# Patient Record
Sex: Female | Born: 1970 | Race: White | Hispanic: No | Marital: Single | Smoking: Current every day smoker
Health system: Southern US, Community
[De-identification: ages and names within clinical notes are randomized; demographics above are authoritative.]

## PROBLEM LIST (undated history)

## (undated) DIAGNOSIS — I2109 ST elevation (STEMI) myocardial infarction involving other coronary artery of anterior wall: Secondary | ICD-10-CM

## (undated) DIAGNOSIS — E785 Hyperlipidemia, unspecified: Secondary | ICD-10-CM

## (undated) DIAGNOSIS — E1011 Type 1 diabetes mellitus with ketoacidosis with coma: Secondary | ICD-10-CM

## (undated) DIAGNOSIS — H269 Unspecified cataract: Secondary | ICD-10-CM

## (undated) DIAGNOSIS — E119 Type 2 diabetes mellitus without complications: Secondary | ICD-10-CM

## (undated) DIAGNOSIS — I6522 Occlusion and stenosis of left carotid artery: Secondary | ICD-10-CM

## (undated) DIAGNOSIS — F419 Anxiety disorder, unspecified: Secondary | ICD-10-CM

## (undated) DIAGNOSIS — Z8673 Personal history of transient ischemic attack (TIA), and cerebral infarction without residual deficits: Secondary | ICD-10-CM

## (undated) DIAGNOSIS — R51 Headache: Secondary | ICD-10-CM

## (undated) DIAGNOSIS — N059 Unspecified nephritic syndrome with unspecified morphologic changes: Secondary | ICD-10-CM

## (undated) DIAGNOSIS — B029 Zoster without complications: Secondary | ICD-10-CM

## (undated) DIAGNOSIS — G8191 Hemiplegia, unspecified affecting right dominant side: Secondary | ICD-10-CM

## (undated) DIAGNOSIS — Z89512 Acquired absence of left leg below knee: Secondary | ICD-10-CM

## (undated) DIAGNOSIS — L03211 Cellulitis of face: Secondary | ICD-10-CM

## (undated) DIAGNOSIS — I255 Ischemic cardiomyopathy: Secondary | ICD-10-CM

## (undated) DIAGNOSIS — N189 Chronic kidney disease, unspecified: Secondary | ICD-10-CM

## (undated) DIAGNOSIS — Z794 Long term (current) use of insulin: Secondary | ICD-10-CM

## (undated) DIAGNOSIS — F32A Depression, unspecified: Secondary | ICD-10-CM

## (undated) DIAGNOSIS — IMO0001 Reserved for inherently not codable concepts without codable children: Secondary | ICD-10-CM

## (undated) DIAGNOSIS — I739 Peripheral vascular disease, unspecified: Secondary | ICD-10-CM

## (undated) DIAGNOSIS — I639 Cerebral infarction, unspecified: Secondary | ICD-10-CM

## (undated) DIAGNOSIS — I2510 Atherosclerotic heart disease of native coronary artery without angina pectoris: Secondary | ICD-10-CM

## (undated) DIAGNOSIS — F329 Major depressive disorder, single episode, unspecified: Secondary | ICD-10-CM

## (undated) DIAGNOSIS — D689 Coagulation defect, unspecified: Secondary | ICD-10-CM

## (undated) DIAGNOSIS — I749 Embolism and thrombosis of unspecified artery: Secondary | ICD-10-CM

## (undated) HISTORY — DX: Peripheral vascular disease, unspecified: I73.9

## (undated) HISTORY — DX: Personal history of transient ischemic attack (TIA), and cerebral infarction without residual deficits: Z86.73

## (undated) HISTORY — DX: Chronic kidney disease, unspecified: N18.9

## (undated) HISTORY — DX: Cerebral infarction, unspecified: I63.9

## (undated) HISTORY — DX: Cellulitis of face: L03.211

## (undated) HISTORY — DX: Hemiplegia, unspecified affecting right dominant side: G81.91

## (undated) HISTORY — DX: Atherosclerotic heart disease of native coronary artery without angina pectoris: I25.10

## (undated) HISTORY — DX: Major depressive disorder, single episode, unspecified: F32.9

## (undated) HISTORY — DX: Type 2 diabetes mellitus without complications: E11.9

## (undated) HISTORY — DX: Embolism and thrombosis of unspecified artery: I74.9

## (undated) HISTORY — DX: Unspecified cataract: H26.9

## (undated) HISTORY — PX: WRIST SURGERY: SHX841

## (undated) HISTORY — DX: Reserved for inherently not codable concepts without codable children: IMO0001

## (undated) HISTORY — PX: DILATION AND CURETTAGE OF UTERUS: SHX78

## (undated) HISTORY — DX: Long term (current) use of insulin: Z79.4

## (undated) HISTORY — DX: Coagulation defect, unspecified: D68.9

## (undated) HISTORY — DX: Acquired absence of left leg below knee: Z89.512

## (undated) HISTORY — DX: ST elevation (STEMI) myocardial infarction involving other coronary artery of anterior wall: I21.09

## (undated) HISTORY — DX: Unspecified nephritic syndrome with unspecified morphologic changes: N05.9

## (undated) HISTORY — DX: Ischemic cardiomyopathy: I25.5

## (undated) HISTORY — DX: Type 1 diabetes mellitus with ketoacidosis with coma: E10.11

## (undated) HISTORY — PX: MULTIPLE TOOTH EXTRACTIONS: SHX2053

## (undated) SURGERY — Surgical Case
Anesthesia: *Unknown

## (undated) NOTE — *Deleted (*Deleted)
Diabetes Mellitus and Nutrition, Adult When you have diabetes (diabetes mellitus), it is very important to have healthy eating habits because your blood sugar (glucose) levels are greatly affected by what you eat and drink. Eating healthy foods in the appropriate amounts, at about the same times every day, can help you:  Control your blood glucose.  Lower your risk of heart disease.  Improve your blood pressure.  Reach or maintain a healthy weight. Every person with diabetes is different, and each person has different needs for a meal plan. Your health care provider may recommend that you work with a diet and nutrition specialist (dietitian) to make a meal plan that is best for you. Your meal plan may vary depending on factors such as:  The calories you need.  The medicines you take.  Your weight.  Your blood glucose, blood pressure, and cholesterol levels.  Your activity level.  Other health conditions you have, such as heart or kidney disease. How do carbohydrates affect me? Carbohydrates, also called carbs, affect your blood glucose level more than any other type of food. Eating carbs naturally raises the amount of glucose in your blood. Carb counting is a method for keeping track of how many carbs you eat. Counting carbs is important to keep your blood glucose at a healthy level, especially if you use insulin or take certain oral diabetes medicines. It is important to know how many carbs you can safely have in each meal. This is different for every person. Your dietitian can help you calculate how many carbs you should have at each meal and for each snack. Foods that contain carbs include:  Bread, cereal, rice, pasta, and crackers.  Potatoes and corn.  Peas, beans, and lentils.  Milk and yogurt.  Fruit and juice.  Desserts, such as cakes, cookies, ice cream, and candy. How does alcohol affect me? Alcohol can cause a sudden decrease in blood glucose (hypoglycemia),  especially if you use insulin or take certain oral diabetes medicines. Hypoglycemia can be a life-threatening condition. Symptoms of hypoglycemia (sleepiness, dizziness, and confusion) are similar to symptoms of having too much alcohol. If your health care provider says that alcohol is safe for you, follow these guidelines:  Limit alcohol intake to no more than 1 drink per day for nonpregnant women and 2 drinks per day for men. One drink equals 12 oz of beer, 5 oz of wine, or 1 oz of hard liquor.  Do not drink on an empty stomach.  Keep yourself hydrated with water, diet soda, or unsweetened iced tea.  Keep in mind that regular soda, juice, and other mixers may contain a lot of sugar and must be counted as carbs. What are tips for following this plan?  Reading food labels  Start by checking the serving size on the "Nutrition Facts" label of packaged foods and drinks. The amount of calories, carbs, fats, and other nutrients listed on the label is based on one serving of the item. Many items contain more than one serving per package.  Check the total grams (g) of carbs in one serving. You can calculate the number of servings of carbs in one serving by dividing the total carbs by 15. For example, if a food has 30 g of total carbs, it would be equal to 2 servings of carbs.  Check the number of grams (g) of saturated and trans fats in one serving. Choose foods that have low or no amount of these fats.  Check the number of   milligrams (mg) of salt (sodium) in one serving. Most people should limit total sodium intake to less than 2,300 mg per day.  Always check the nutrition information of foods labeled as "low-fat" or "nonfat". These foods may be higher in added sugar or refined carbs and should be avoided.  Talk to your dietitian to identify your daily goals for nutrients listed on the label. Shopping  Avoid buying canned, premade, or processed foods. These foods tend to be high in fat, sodium,  and added sugar.  Shop around the outside edge of the grocery store. This includes fresh fruits and vegetables, bulk grains, fresh meats, and fresh dairy. Cooking  Use low-heat cooking methods, such as baking, instead of high-heat cooking methods like deep frying.  Cook using healthy oils, such as olive, canola, or sunflower oil.  Avoid cooking with butter, cream, or high-fat meats. Meal planning  Eat meals and snacks regularly, preferably at the same times every day. Avoid going long periods of time without eating.  Eat foods high in fiber, such as fresh fruits, vegetables, beans, and whole grains. Talk to your dietitian about how many servings of carbs you can eat at each meal.  Eat 4-6 ounces (oz) of lean protein each day, such as lean meat, chicken, fish, eggs, or tofu. One oz of lean protein is equal to: ? 1 oz of meat, chicken, or fish. ? 1 egg. ?  cup of tofu.  Eat some foods each day that contain healthy fats, such as avocado, nuts, seeds, and fish. Lifestyle  Check your blood glucose regularly.  Exercise regularly as told by your health care provider. This may include: ? 150 minutes of moderate-intensity or vigorous-intensity exercise each week. This could be brisk walking, biking, or water aerobics. ? Stretching and doing strength exercises, such as yoga or weightlifting, at least 2 times a week.  Take medicines as told by your health care provider.  Do not use any products that contain nicotine or tobacco, such as cigarettes and e-cigarettes. If you need help quitting, ask your health care provider.  Work with a counselor or diabetes educator to identify strategies to manage stress and any emotional and social challenges. Questions to ask a health care provider  Do I need to meet with a diabetes educator?  Do I need to meet with a dietitian?  What number can I call if I have questions?  When are the best times to check my blood glucose? Where to find more  information:  American Diabetes Association: diabetes.org  Academy of Nutrition and Dietetics: www.eatright.org  National Institute of Diabetes and Digestive and Kidney Diseases (NIH): www.niddk.nih.gov Summary  A healthy meal plan will help you control your blood glucose and maintain a healthy lifestyle.  Working with a diet and nutrition specialist (dietitian) can help you make a meal plan that is best for you.  Keep in mind that carbohydrates (carbs) and alcohol have immediate effects on your blood glucose levels. It is important to count carbs and to use alcohol carefully. This information is not intended to replace advice given to you by your health care provider. Make sure you discuss any questions you have with your health care provider. Document Revised: 12/17/2016 Document Reviewed: 02/09/2016 Elsevier Patient Education  2020 Elsevier Inc.  

---

## 2000-08-19 ENCOUNTER — Emergency Department (HOSPITAL_COMMUNITY): Admission: EM | Admit: 2000-08-19 | Discharge: 2000-08-19 | Payer: Self-pay | Admitting: Emergency Medicine

## 2000-08-19 ENCOUNTER — Encounter: Payer: Self-pay | Admitting: *Deleted

## 2000-08-24 ENCOUNTER — Ambulatory Visit (HOSPITAL_COMMUNITY): Admission: RE | Admit: 2000-08-24 | Discharge: 2000-08-24 | Payer: Self-pay | Admitting: *Deleted

## 2000-08-24 ENCOUNTER — Encounter: Payer: Self-pay | Admitting: *Deleted

## 2000-10-08 ENCOUNTER — Emergency Department (HOSPITAL_COMMUNITY): Admission: EM | Admit: 2000-10-08 | Discharge: 2000-10-08 | Payer: Self-pay | Admitting: Emergency Medicine

## 2000-12-05 ENCOUNTER — Emergency Department (HOSPITAL_COMMUNITY): Admission: EM | Admit: 2000-12-05 | Discharge: 2000-12-05 | Payer: Self-pay | Admitting: Emergency Medicine

## 2000-12-05 ENCOUNTER — Encounter: Payer: Self-pay | Admitting: Emergency Medicine

## 2001-01-05 ENCOUNTER — Emergency Department (HOSPITAL_COMMUNITY): Admission: EM | Admit: 2001-01-05 | Discharge: 2001-01-05 | Payer: Self-pay | Admitting: Internal Medicine

## 2001-01-05 ENCOUNTER — Encounter: Payer: Self-pay | Admitting: Internal Medicine

## 2001-04-01 ENCOUNTER — Encounter: Payer: Self-pay | Admitting: Emergency Medicine

## 2001-04-02 ENCOUNTER — Inpatient Hospital Stay (HOSPITAL_COMMUNITY): Admission: EM | Admit: 2001-04-02 | Discharge: 2001-04-04 | Payer: Self-pay | Admitting: Emergency Medicine

## 2001-04-03 ENCOUNTER — Encounter: Payer: Self-pay | Admitting: Family Medicine

## 2001-04-12 ENCOUNTER — Emergency Department (HOSPITAL_COMMUNITY): Admission: EM | Admit: 2001-04-12 | Discharge: 2001-04-12 | Payer: Self-pay

## 2001-05-08 ENCOUNTER — Emergency Department (HOSPITAL_COMMUNITY): Admission: EM | Admit: 2001-05-08 | Discharge: 2001-05-08 | Payer: Self-pay | Admitting: Emergency Medicine

## 2001-06-15 ENCOUNTER — Emergency Department (HOSPITAL_COMMUNITY): Admission: EM | Admit: 2001-06-15 | Discharge: 2001-06-15 | Payer: Self-pay | Admitting: Emergency Medicine

## 2001-07-05 ENCOUNTER — Ambulatory Visit (HOSPITAL_COMMUNITY): Admission: RE | Admit: 2001-07-05 | Discharge: 2001-07-05 | Payer: Self-pay | Admitting: Family Medicine

## 2001-07-05 ENCOUNTER — Encounter: Payer: Self-pay | Admitting: Family Medicine

## 2001-12-30 ENCOUNTER — Emergency Department (HOSPITAL_COMMUNITY): Admission: EM | Admit: 2001-12-30 | Discharge: 2001-12-30 | Payer: Self-pay | Admitting: Emergency Medicine

## 2001-12-30 ENCOUNTER — Encounter: Payer: Self-pay | Admitting: Emergency Medicine

## 2002-04-03 ENCOUNTER — Emergency Department (HOSPITAL_COMMUNITY): Admission: EM | Admit: 2002-04-03 | Discharge: 2002-04-03 | Payer: Self-pay | Admitting: *Deleted

## 2002-04-03 ENCOUNTER — Encounter: Payer: Self-pay | Admitting: *Deleted

## 2002-07-10 ENCOUNTER — Encounter: Payer: Self-pay | Admitting: *Deleted

## 2002-07-10 ENCOUNTER — Emergency Department (HOSPITAL_COMMUNITY): Admission: EM | Admit: 2002-07-10 | Discharge: 2002-07-10 | Payer: Self-pay | Admitting: *Deleted

## 2002-11-23 ENCOUNTER — Emergency Department (HOSPITAL_COMMUNITY): Admission: EM | Admit: 2002-11-23 | Discharge: 2002-11-23 | Payer: Self-pay | Admitting: Emergency Medicine

## 2003-02-06 ENCOUNTER — Ambulatory Visit (HOSPITAL_COMMUNITY): Admission: RE | Admit: 2003-02-06 | Discharge: 2003-02-06 | Payer: Self-pay | Admitting: Family Medicine

## 2003-06-01 ENCOUNTER — Inpatient Hospital Stay (HOSPITAL_COMMUNITY): Admission: EM | Admit: 2003-06-01 | Discharge: 2003-06-03 | Payer: Self-pay | Admitting: Emergency Medicine

## 2003-06-13 ENCOUNTER — Ambulatory Visit (HOSPITAL_COMMUNITY): Admission: RE | Admit: 2003-06-13 | Discharge: 2003-06-13 | Payer: Self-pay | Admitting: Orthopedic Surgery

## 2003-06-17 ENCOUNTER — Emergency Department (HOSPITAL_COMMUNITY): Admission: EM | Admit: 2003-06-17 | Discharge: 2003-06-17 | Payer: Self-pay | Admitting: Emergency Medicine

## 2003-08-14 ENCOUNTER — Emergency Department (HOSPITAL_COMMUNITY): Admission: EM | Admit: 2003-08-14 | Discharge: 2003-08-14 | Payer: Self-pay | Admitting: Emergency Medicine

## 2003-09-14 ENCOUNTER — Emergency Department (HOSPITAL_COMMUNITY): Admission: EM | Admit: 2003-09-14 | Discharge: 2003-09-14 | Payer: Self-pay | Admitting: Emergency Medicine

## 2003-12-27 ENCOUNTER — Ambulatory Visit (HOSPITAL_COMMUNITY): Admission: RE | Admit: 2003-12-27 | Discharge: 2003-12-27 | Payer: Self-pay | Admitting: Orthopedic Surgery

## 2003-12-27 ENCOUNTER — Ambulatory Visit (HOSPITAL_BASED_OUTPATIENT_CLINIC_OR_DEPARTMENT_OTHER): Admission: RE | Admit: 2003-12-27 | Discharge: 2003-12-27 | Payer: Self-pay | Admitting: Orthopedic Surgery

## 2004-03-25 ENCOUNTER — Emergency Department (HOSPITAL_COMMUNITY): Admission: EM | Admit: 2004-03-25 | Discharge: 2004-03-25 | Payer: Self-pay | Admitting: Emergency Medicine

## 2004-04-27 ENCOUNTER — Emergency Department (HOSPITAL_COMMUNITY): Admission: EM | Admit: 2004-04-27 | Discharge: 2004-04-27 | Payer: Self-pay | Admitting: Emergency Medicine

## 2004-09-09 ENCOUNTER — Inpatient Hospital Stay (HOSPITAL_COMMUNITY): Admission: AD | Admit: 2004-09-09 | Discharge: 2004-09-11 | Payer: Self-pay | Admitting: Obstetrics & Gynecology

## 2004-10-05 ENCOUNTER — Emergency Department (HOSPITAL_COMMUNITY): Admission: EM | Admit: 2004-10-05 | Discharge: 2004-10-06 | Payer: Self-pay | Admitting: Emergency Medicine

## 2004-12-12 ENCOUNTER — Emergency Department (HOSPITAL_COMMUNITY): Admission: EM | Admit: 2004-12-12 | Discharge: 2004-12-12 | Payer: Self-pay | Admitting: Emergency Medicine

## 2005-05-10 ENCOUNTER — Emergency Department (HOSPITAL_COMMUNITY): Admission: EM | Admit: 2005-05-10 | Discharge: 2005-05-10 | Payer: Self-pay | Admitting: Emergency Medicine

## 2005-05-10 ENCOUNTER — Ambulatory Visit (HOSPITAL_COMMUNITY): Admission: RE | Admit: 2005-05-10 | Discharge: 2005-05-10 | Payer: Self-pay | Admitting: Emergency Medicine

## 2005-05-15 ENCOUNTER — Emergency Department (HOSPITAL_COMMUNITY): Admission: EM | Admit: 2005-05-15 | Discharge: 2005-05-15 | Payer: Self-pay | Admitting: Emergency Medicine

## 2005-08-17 ENCOUNTER — Emergency Department (HOSPITAL_COMMUNITY): Admission: EM | Admit: 2005-08-17 | Discharge: 2005-08-17 | Payer: Self-pay | Admitting: Emergency Medicine

## 2005-10-02 ENCOUNTER — Emergency Department (HOSPITAL_COMMUNITY): Admission: EM | Admit: 2005-10-02 | Discharge: 2005-10-02 | Payer: Self-pay | Admitting: Emergency Medicine

## 2005-10-09 ENCOUNTER — Emergency Department (HOSPITAL_COMMUNITY): Admission: EM | Admit: 2005-10-09 | Discharge: 2005-10-09 | Payer: Self-pay | Admitting: Emergency Medicine

## 2005-10-27 ENCOUNTER — Ambulatory Visit (HOSPITAL_COMMUNITY): Admission: RE | Admit: 2005-10-27 | Discharge: 2005-10-27 | Payer: Self-pay | Admitting: General Surgery

## 2005-10-28 ENCOUNTER — Ambulatory Visit (HOSPITAL_COMMUNITY): Admission: RE | Admit: 2005-10-28 | Discharge: 2005-10-28 | Payer: Self-pay | Admitting: General Surgery

## 2005-11-18 ENCOUNTER — Encounter (INDEPENDENT_AMBULATORY_CARE_PROVIDER_SITE_OTHER): Payer: Self-pay | Admitting: *Deleted

## 2005-11-18 ENCOUNTER — Encounter: Admission: RE | Admit: 2005-11-18 | Discharge: 2005-11-18 | Payer: Self-pay | Admitting: General Surgery

## 2005-11-23 LAB — HM MAMMOGRAPHY

## 2005-12-02 ENCOUNTER — Emergency Department (HOSPITAL_COMMUNITY): Admission: EM | Admit: 2005-12-02 | Discharge: 2005-12-02 | Payer: Self-pay | Admitting: Emergency Medicine

## 2006-01-23 ENCOUNTER — Emergency Department (HOSPITAL_COMMUNITY): Admission: EM | Admit: 2006-01-23 | Discharge: 2006-01-23 | Payer: Self-pay | Admitting: Emergency Medicine

## 2006-04-25 ENCOUNTER — Emergency Department (HOSPITAL_COMMUNITY): Admission: EM | Admit: 2006-04-25 | Discharge: 2006-04-25 | Payer: Self-pay | Admitting: Emergency Medicine

## 2006-06-15 ENCOUNTER — Emergency Department (HOSPITAL_COMMUNITY): Admission: EM | Admit: 2006-06-15 | Discharge: 2006-06-15 | Payer: Self-pay | Admitting: Emergency Medicine

## 2006-07-17 ENCOUNTER — Emergency Department (HOSPITAL_COMMUNITY): Admission: EM | Admit: 2006-07-17 | Discharge: 2006-07-17 | Payer: Self-pay | Admitting: Emergency Medicine

## 2006-10-11 ENCOUNTER — Emergency Department (HOSPITAL_COMMUNITY): Admission: EM | Admit: 2006-10-11 | Discharge: 2006-10-11 | Payer: Self-pay | Admitting: Emergency Medicine

## 2006-11-21 ENCOUNTER — Emergency Department (HOSPITAL_COMMUNITY): Admission: EM | Admit: 2006-11-21 | Discharge: 2006-11-22 | Payer: Self-pay | Admitting: Emergency Medicine

## 2007-05-23 ENCOUNTER — Emergency Department (HOSPITAL_COMMUNITY): Admission: EM | Admit: 2007-05-23 | Discharge: 2007-05-23 | Payer: Self-pay | Admitting: Emergency Medicine

## 2007-07-21 ENCOUNTER — Emergency Department (HOSPITAL_COMMUNITY): Admission: EM | Admit: 2007-07-21 | Discharge: 2007-07-21 | Payer: Self-pay | Admitting: Emergency Medicine

## 2007-08-21 ENCOUNTER — Ambulatory Visit (HOSPITAL_COMMUNITY): Admission: RE | Admit: 2007-08-21 | Discharge: 2007-08-21 | Payer: Self-pay | Admitting: Internal Medicine

## 2007-09-05 ENCOUNTER — Ambulatory Visit (HOSPITAL_COMMUNITY): Admission: RE | Admit: 2007-09-05 | Discharge: 2007-09-05 | Payer: Self-pay | Admitting: Pulmonary Disease

## 2007-09-29 ENCOUNTER — Emergency Department (HOSPITAL_COMMUNITY): Admission: EM | Admit: 2007-09-29 | Discharge: 2007-09-29 | Payer: Self-pay | Admitting: Emergency Medicine

## 2007-10-09 ENCOUNTER — Emergency Department (HOSPITAL_COMMUNITY): Admission: EM | Admit: 2007-10-09 | Discharge: 2007-10-09 | Payer: Self-pay | Admitting: Emergency Medicine

## 2007-10-09 ENCOUNTER — Ambulatory Visit (HOSPITAL_COMMUNITY): Admission: RE | Admit: 2007-10-09 | Discharge: 2007-10-09 | Payer: Self-pay | Admitting: Emergency Medicine

## 2007-10-12 ENCOUNTER — Ambulatory Visit (HOSPITAL_COMMUNITY): Admission: RE | Admit: 2007-10-12 | Discharge: 2007-10-12 | Payer: Self-pay | Admitting: Pulmonary Disease

## 2007-10-31 ENCOUNTER — Emergency Department (HOSPITAL_COMMUNITY): Admission: EM | Admit: 2007-10-31 | Discharge: 2007-10-31 | Payer: Self-pay | Admitting: Emergency Medicine

## 2007-11-14 ENCOUNTER — Observation Stay (HOSPITAL_COMMUNITY): Admission: EM | Admit: 2007-11-14 | Discharge: 2007-11-15 | Payer: Self-pay | Admitting: Emergency Medicine

## 2008-03-08 ENCOUNTER — Emergency Department (HOSPITAL_COMMUNITY): Admission: EM | Admit: 2008-03-08 | Discharge: 2008-03-08 | Payer: Self-pay | Admitting: Emergency Medicine

## 2008-04-15 ENCOUNTER — Ambulatory Visit (HOSPITAL_COMMUNITY): Admission: RE | Admit: 2008-04-15 | Discharge: 2008-04-15 | Payer: Self-pay | Admitting: Pulmonary Disease

## 2008-05-04 ENCOUNTER — Emergency Department (HOSPITAL_COMMUNITY): Admission: EM | Admit: 2008-05-04 | Discharge: 2008-05-04 | Payer: Self-pay | Admitting: Emergency Medicine

## 2008-11-28 ENCOUNTER — Emergency Department (HOSPITAL_COMMUNITY): Admission: EM | Admit: 2008-11-28 | Discharge: 2008-11-29 | Payer: Self-pay | Admitting: Emergency Medicine

## 2009-01-24 ENCOUNTER — Emergency Department (HOSPITAL_COMMUNITY): Admission: EM | Admit: 2009-01-24 | Discharge: 2009-01-24 | Payer: Self-pay | Admitting: Emergency Medicine

## 2009-01-29 ENCOUNTER — Emergency Department (HOSPITAL_COMMUNITY): Admission: EM | Admit: 2009-01-29 | Discharge: 2009-01-29 | Payer: Self-pay | Admitting: Emergency Medicine

## 2009-03-27 ENCOUNTER — Other Ambulatory Visit: Payer: Self-pay | Admitting: Emergency Medicine

## 2009-03-28 ENCOUNTER — Ambulatory Visit: Payer: Self-pay | Admitting: Psychiatry

## 2009-03-28 ENCOUNTER — Other Ambulatory Visit: Payer: Self-pay | Admitting: Emergency Medicine

## 2009-03-28 ENCOUNTER — Inpatient Hospital Stay (HOSPITAL_COMMUNITY): Admission: RE | Admit: 2009-03-28 | Discharge: 2009-03-31 | Payer: Self-pay | Admitting: Psychiatry

## 2010-02-07 ENCOUNTER — Encounter: Payer: Self-pay | Admitting: General Surgery

## 2010-02-07 ENCOUNTER — Encounter: Payer: Self-pay | Admitting: Family Medicine

## 2010-02-08 ENCOUNTER — Encounter: Payer: Self-pay | Admitting: Pulmonary Disease

## 2010-02-08 ENCOUNTER — Encounter: Payer: Self-pay | Admitting: General Surgery

## 2010-03-21 ENCOUNTER — Inpatient Hospital Stay (HOSPITAL_COMMUNITY)
Admission: EM | Admit: 2010-03-21 | Discharge: 2010-03-25 | DRG: 638 | Disposition: A | Discharge status: Home / Self Care | Payer: Medicaid Other | Attending: Pulmonary Disease | Admitting: Pulmonary Disease

## 2010-03-21 ENCOUNTER — Inpatient Hospital Stay (HOSPITAL_COMMUNITY): Payer: Medicaid Other

## 2010-03-21 DIAGNOSIS — F411 Generalized anxiety disorder: Secondary | ICD-10-CM | POA: Diagnosis present

## 2010-03-21 DIAGNOSIS — E871 Hypo-osmolality and hyponatremia: Secondary | ICD-10-CM | POA: Diagnosis present

## 2010-03-21 DIAGNOSIS — R609 Edema, unspecified: Secondary | ICD-10-CM | POA: Diagnosis present

## 2010-03-21 DIAGNOSIS — Z794 Long term (current) use of insulin: Secondary | ICD-10-CM

## 2010-03-21 DIAGNOSIS — F3289 Other specified depressive episodes: Secondary | ICD-10-CM | POA: Diagnosis present

## 2010-03-21 DIAGNOSIS — IMO0002 Reserved for concepts with insufficient information to code with codable children: Principal | ICD-10-CM | POA: Diagnosis present

## 2010-03-21 DIAGNOSIS — F329 Major depressive disorder, single episode, unspecified: Secondary | ICD-10-CM | POA: Diagnosis present

## 2010-03-21 DIAGNOSIS — F1410 Cocaine abuse, uncomplicated: Secondary | ICD-10-CM | POA: Diagnosis present

## 2010-03-21 DIAGNOSIS — E1065 Type 1 diabetes mellitus with hyperglycemia: Principal | ICD-10-CM | POA: Diagnosis present

## 2010-03-21 LAB — DIFFERENTIAL
Basophils Absolute: 0 10*3/uL (ref 0.0–0.1)
Basophils Relative: 1 % (ref 0–1)
Eosinophils Absolute: 0.1 10*3/uL (ref 0.0–0.7)
Eosinophils Relative: 2 % (ref 0–5)
Lymphocytes Relative: 34 % (ref 12–46)
Lymphs Abs: 2 10*3/uL (ref 0.7–4.0)
Monocytes Absolute: 0.5 10*3/uL (ref 0.1–1.0)
Monocytes Relative: 8 % (ref 3–12)
Neutro Abs: 3.3 10*3/uL (ref 1.7–7.7)
Neutrophils Relative %: 56 % (ref 43–77)

## 2010-03-21 LAB — BASIC METABOLIC PANEL
BUN: 8 mg/dL (ref 6–23)
CO2: 24 mEq/L (ref 19–32)
Calcium: 7.3 mg/dL — ABNORMAL LOW (ref 8.4–10.5)
Chloride: 95 mEq/L — ABNORMAL LOW (ref 96–112)
Creatinine, Ser: 0.9 mg/dL (ref 0.4–1.2)
GFR calc Af Amer: >60 mL/min (ref >60)
GFR calc non Af Amer: >60 mL/min (ref >60)
Glucose, Bld: 675 mg/dL (ref 70–99)
Potassium: 3.9 mEq/L (ref 3.5–5.1)
Sodium: 125 mEq/L — ABNORMAL LOW (ref 135–145)

## 2010-03-21 LAB — CBC
HCT: 42.7 % (ref 36.0–46.0)
Hemoglobin: 13.9 g/dL (ref 12.0–15.0)
MCH: 30.5 pg (ref 26.0–34.0)
MCHC: 32.6 g/dL (ref 30.0–36.0)
MCV: 93.6 fL (ref 78.0–100.0)
Platelets: 386 10*3/uL (ref 150–400)
RBC: 4.56 MIL/uL (ref 3.87–5.11)
RDW: 13.7 % (ref 11.5–15.5)
WBC: 6 10*3/uL (ref 4.0–10.5)

## 2010-03-21 LAB — URINALYSIS, ROUTINE W REFLEX MICROSCOPIC
Bilirubin Urine: NEGATIVE
Bilirubin Urine: NEGATIVE
Glucose, UA: >1000 mg/dL — AB
Glucose, UA: >1000 mg/dL — AB
Ketones, ur: NEGATIVE mg/dL
Ketones, ur: NEGATIVE mg/dL
Leukocytes, UA: NEGATIVE
Leukocytes, UA: NEGATIVE
Nitrite: NEGATIVE
Nitrite: NEGATIVE
Protein, ur: 100 mg/dL — AB
Protein, ur: 100 mg/dL — AB
Specific Gravity, Urine: 1.01 (ref 1.005–1.030)
Specific Gravity, Urine: 1.01 (ref 1.005–1.030)
Urobilinogen, UA: 0.2 mg/dL (ref 0.0–1.0)
Urobilinogen, UA: 0.2 mg/dL (ref 0.0–1.0)
pH: 6 (ref 5.0–8.0)
pH: 6 (ref 5.0–8.0)

## 2010-03-21 LAB — HEPATIC FUNCTION PANEL
ALT: 16 U/L (ref 0–35)
AST: 31 U/L (ref 0–37)
Albumin: 1.4 g/dL — ABNORMAL LOW (ref 3.5–5.2)
Alkaline Phosphatase: 87 U/L (ref 39–117)
Bilirubin, Direct: 0.2 mg/dL (ref 0.0–0.3)
Indirect Bilirubin: 0.3 mg/dL (ref 0.3–0.9)
Total Bilirubin: 0.5 mg/dL (ref 0.3–1.2)
Total Protein: 4.3 g/dL — ABNORMAL LOW (ref 6.0–8.3)

## 2010-03-21 LAB — URINE MICROSCOPIC-ADD ON

## 2010-03-21 LAB — RAPID URINE DRUG SCREEN, HOSP PERFORMED
Amphetamines: NOT DETECTED
Barbiturates: NOT DETECTED
Benzodiazepines: NOT DETECTED
Cocaine: POSITIVE — AB
Opiates: NOT DETECTED
Tetrahydrocannabinol: NOT DETECTED

## 2010-03-21 LAB — GLUCOSE, CAPILLARY
Glucose-Capillary: 551 mg/dL (ref 70–99)
Glucose-Capillary: 348 mg/dL — ABNORMAL HIGH (ref 70–99)

## 2010-03-22 LAB — BASIC METABOLIC PANEL
BUN: 11 mg/dL (ref 6–23)
BUN: 12 mg/dL (ref 6–23)
BUN: 13 mg/dL (ref 6–23)
CO2: 29 mEq/L (ref 19–32)
CO2: 29 mEq/L (ref 19–32)
CO2: 28 mEq/L (ref 19–32)
Calcium: 7.7 mg/dL — ABNORMAL LOW (ref 8.4–10.5)
Calcium: 7.6 mg/dL — ABNORMAL LOW (ref 8.4–10.5)
Calcium: 7.6 mg/dL — ABNORMAL LOW (ref 8.4–10.5)
Chloride: 107 mEq/L (ref 96–112)
Chloride: 105 mEq/L (ref 96–112)
Chloride: 104 mEq/L (ref 96–112)
Creatinine, Ser: 0.63 mg/dL (ref 0.4–1.2)
Creatinine, Ser: 0.66 mg/dL (ref 0.4–1.2)
Creatinine, Ser: 0.59 mg/dL (ref 0.4–1.2)
GFR calc Af Amer: >60 mL/min (ref >60)
GFR calc Af Amer: >60 mL/min (ref >60)
GFR calc Af Amer: >60 mL/min (ref >60)
GFR calc non Af Amer: >60 mL/min (ref >60)
GFR calc non Af Amer: >60 mL/min (ref >60)
GFR calc non Af Amer: >60 mL/min (ref >60)
Glucose, Bld: 198 mg/dL — ABNORMAL HIGH (ref 70–99)
Glucose, Bld: 288 mg/dL — ABNORMAL HIGH (ref 70–99)
Glucose, Bld: 94 mg/dL (ref 70–99)
Potassium: 3.7 mEq/L (ref 3.5–5.1)
Potassium: 3.8 mEq/L (ref 3.5–5.1)
Potassium: 3.8 mEq/L (ref 3.5–5.1)
Sodium: 140 mEq/L (ref 135–145)
Sodium: 137 mEq/L (ref 135–145)
Sodium: 136 mEq/L (ref 135–145)

## 2010-03-22 LAB — GLUCOSE, CAPILLARY
Glucose-Capillary: 121 mg/dL — ABNORMAL HIGH (ref 70–99)
Glucose-Capillary: 123 mg/dL — ABNORMAL HIGH (ref 70–99)
Glucose-Capillary: 150 mg/dL — ABNORMAL HIGH (ref 70–99)
Glucose-Capillary: 149 mg/dL — ABNORMAL HIGH (ref 70–99)
Glucose-Capillary: 189 mg/dL — ABNORMAL HIGH (ref 70–99)
Glucose-Capillary: 189 mg/dL — ABNORMAL HIGH (ref 70–99)
Glucose-Capillary: 198 mg/dL — ABNORMAL HIGH (ref 70–99)
Glucose-Capillary: 207 mg/dL — ABNORMAL HIGH (ref 70–99)
Glucose-Capillary: 227 mg/dL — ABNORMAL HIGH (ref 70–99)
Glucose-Capillary: 118 mg/dL — ABNORMAL HIGH (ref 70–99)
Glucose-Capillary: 105 mg/dL — ABNORMAL HIGH (ref 70–99)
Glucose-Capillary: 244 mg/dL — ABNORMAL HIGH (ref 70–99)
Glucose-Capillary: 88 mg/dL (ref 70–99)
Glucose-Capillary: 54 mg/dL — ABNORMAL LOW (ref 70–99)

## 2010-03-22 LAB — MRSA PCR SCREENING: MRSA by PCR: NEGATIVE

## 2010-03-23 DIAGNOSIS — I369 Nonrheumatic tricuspid valve disorder, unspecified: Secondary | ICD-10-CM

## 2010-03-23 LAB — TSH: TSH: 3.185 u[IU]/mL (ref 0.350–4.500)

## 2010-03-23 LAB — BASIC METABOLIC PANEL
BUN: 12 mg/dL (ref 6–23)
BUN: 13 mg/dL (ref 6–23)
CO2: 28 mEq/L (ref 19–32)
CO2: 29 mEq/L (ref 19–32)
Calcium: 7.5 mg/dL — ABNORMAL LOW (ref 8.4–10.5)
Calcium: 7.7 mg/dL — ABNORMAL LOW (ref 8.4–10.5)
Chloride: 101 mEq/L (ref 96–112)
Chloride: 102 mEq/L (ref 96–112)
Creatinine, Ser: 0.57 mg/dL (ref 0.4–1.2)
Creatinine, Ser: 0.68 mg/dL (ref 0.4–1.2)
GFR calc Af Amer: >60 mL/min (ref >60)
GFR calc Af Amer: >60 mL/min (ref >60)
GFR calc non Af Amer: >60 mL/min (ref >60)
GFR calc non Af Amer: >60 mL/min (ref >60)
Glucose, Bld: 284 mg/dL — ABNORMAL HIGH (ref 70–99)
Glucose, Bld: 90 mg/dL (ref 70–99)
Potassium: 4.2 mEq/L (ref 3.5–5.1)
Potassium: 4.1 mEq/L (ref 3.5–5.1)
Sodium: 135 mEq/L (ref 135–145)
Sodium: 136 mEq/L (ref 135–145)

## 2010-03-23 LAB — CBC
HCT: 38.4 % (ref 36.0–46.0)
Hemoglobin: 13.2 g/dL (ref 12.0–15.0)
MCH: 31.6 pg (ref 26.0–34.0)
MCHC: 34.4 g/dL (ref 30.0–36.0)
MCV: 91.9 fL (ref 78.0–100.0)
Platelets: 378 10*3/uL (ref 150–400)
RBC: 4.18 MIL/uL (ref 3.87–5.11)
RDW: 13.7 % (ref 11.5–15.5)
WBC: 8 10*3/uL (ref 4.0–10.5)

## 2010-03-23 LAB — T4, FREE: Free T4: 0.66 ng/dL — ABNORMAL LOW (ref 0.80–1.80)

## 2010-03-23 LAB — GLUCOSE, CAPILLARY
Glucose-Capillary: 101 mg/dL — ABNORMAL HIGH (ref 70–99)
Glucose-Capillary: 127 mg/dL — ABNORMAL HIGH (ref 70–99)
Glucose-Capillary: 131 mg/dL — ABNORMAL HIGH (ref 70–99)
Glucose-Capillary: 185 mg/dL — ABNORMAL HIGH (ref 70–99)
Glucose-Capillary: 274 mg/dL — ABNORMAL HIGH (ref 70–99)
Glucose-Capillary: 57 mg/dL — ABNORMAL LOW (ref 70–99)
Glucose-Capillary: 60 mg/dL — ABNORMAL LOW (ref 70–99)
Glucose-Capillary: 79 mg/dL (ref 70–99)

## 2010-03-23 LAB — COMPREHENSIVE METABOLIC PANEL
ALT: 15 U/L (ref 0–35)
AST: 19 U/L (ref 0–37)
Albumin: 1.2 g/dL — ABNORMAL LOW (ref 3.5–5.2)
Alkaline Phosphatase: 56 U/L (ref 39–117)
BUN: 12 mg/dL (ref 6–23)
CO2: 28 mEq/L (ref 19–32)
Calcium: 7.7 mg/dL — ABNORMAL LOW (ref 8.4–10.5)
Chloride: 103 mEq/L (ref 96–112)
Creatinine, Ser: 0.52 mg/dL (ref 0.4–1.2)
GFR calc Af Amer: >60 mL/min (ref >60)
GFR calc non Af Amer: >60 mL/min (ref >60)
Glucose, Bld: 191 mg/dL — ABNORMAL HIGH (ref 70–99)
Potassium: 3.9 mEq/L (ref 3.5–5.1)
Sodium: 136 mEq/L (ref 135–145)
Total Bilirubin: 0.4 mg/dL (ref 0.3–1.2)
Total Protein: 4.1 g/dL — ABNORMAL LOW (ref 6.0–8.3)

## 2010-03-23 LAB — HEMOGLOBIN A1C
Hgb A1c MFr Bld: 11 % — ABNORMAL HIGH (ref <5.7)
Mean Plasma Glucose: 269 mg/dL — ABNORMAL HIGH (ref <117)

## 2010-03-24 LAB — URINE CULTURE
Colony Count: 50000
Culture  Setup Time: 201203042113

## 2010-03-24 LAB — BASIC METABOLIC PANEL
BUN: 10 mg/dL (ref 6–23)
BUN: 11 mg/dL (ref 6–23)
CO2: 29 mEq/L (ref 19–32)
CO2: 29 mEq/L (ref 19–32)
Calcium: 7.8 mg/dL — ABNORMAL LOW (ref 8.4–10.5)
Calcium: 7.5 mg/dL — ABNORMAL LOW (ref 8.4–10.5)
Chloride: 101 mEq/L (ref 96–112)
Chloride: 99 mEq/L (ref 96–112)
Creatinine, Ser: 0.66 mg/dL (ref 0.4–1.2)
Creatinine, Ser: 0.8 mg/dL (ref 0.4–1.2)
GFR calc Af Amer: >60 mL/min (ref >60)
GFR calc Af Amer: >60 mL/min (ref >60)
GFR calc non Af Amer: >60 mL/min (ref >60)
GFR calc non Af Amer: >60 mL/min (ref >60)
Glucose, Bld: 404 mg/dL — ABNORMAL HIGH (ref 70–99)
Glucose, Bld: 352 mg/dL — ABNORMAL HIGH (ref 70–99)
Potassium: 4.8 mEq/L (ref 3.5–5.1)
Potassium: 4.6 mEq/L (ref 3.5–5.1)
Sodium: 134 mEq/L — ABNORMAL LOW (ref 135–145)
Sodium: 130 mEq/L — ABNORMAL LOW (ref 135–145)

## 2010-03-24 LAB — GLUCOSE, CAPILLARY
Glucose-Capillary: 382 mg/dL — ABNORMAL HIGH (ref 70–99)
Glucose-Capillary: 213 mg/dL — ABNORMAL HIGH (ref 70–99)
Glucose-Capillary: 94 mg/dL (ref 70–99)
Glucose-Capillary: 210 mg/dL — ABNORMAL HIGH (ref 70–99)
Glucose-Capillary: 287 mg/dL — ABNORMAL HIGH (ref 70–99)

## 2010-03-25 LAB — GLUCOSE, CAPILLARY: Glucose-Capillary: 432 mg/dL — ABNORMAL HIGH (ref 70–99)

## 2010-04-04 LAB — DIFFERENTIAL
Basophils Absolute: 0.1 10*3/uL (ref 0.0–0.1)
Basophils Absolute: 0.1 10*3/uL (ref 0.0–0.1)
Basophils Relative: 1 % (ref 0–1)
Basophils Relative: 1 % (ref 0–1)
Eosinophils Absolute: 0.1 10*3/uL (ref 0.0–0.7)
Eosinophils Absolute: 0.1 10*3/uL (ref 0.0–0.7)
Eosinophils Relative: 1 % (ref 0–5)
Eosinophils Relative: 1 % (ref 0–5)
Lymphocytes Relative: 17 % (ref 12–46)
Lymphocytes Relative: 20 % (ref 12–46)
Lymphs Abs: 1.4 10*3/uL (ref 0.7–4.0)
Lymphs Abs: 1.6 10*3/uL (ref 0.7–4.0)
Monocytes Absolute: 0.5 10*3/uL (ref 0.1–1.0)
Monocytes Absolute: 0.6 10*3/uL (ref 0.1–1.0)
Monocytes Relative: 6 % (ref 3–12)
Monocytes Relative: 7 % (ref 3–12)
Neutro Abs: 5.6 10*3/uL (ref 1.7–7.7)
Neutro Abs: 6.1 10*3/uL (ref 1.7–7.7)
Neutrophils Relative %: 71 % (ref 43–77)
Neutrophils Relative %: 75 % (ref 43–77)

## 2010-04-04 LAB — CBC
HCT: 44 % (ref 36.0–46.0)
HCT: 44.2 % (ref 36.0–46.0)
Hemoglobin: 15 g/dL (ref 12.0–15.0)
Hemoglobin: 15.3 g/dL — ABNORMAL HIGH (ref 12.0–15.0)
MCHC: 34 g/dL (ref 30.0–36.0)
MCHC: 34.7 g/dL (ref 30.0–36.0)
MCV: 93.2 fL (ref 78.0–100.0)
MCV: 93.7 fL (ref 78.0–100.0)
Platelets: 307 10*3/uL (ref 150–400)
Platelets: 337 10*3/uL (ref 150–400)
RBC: 4.7 MIL/uL (ref 3.87–5.11)
RBC: 4.74 MIL/uL (ref 3.87–5.11)
RDW: 12.6 % (ref 11.5–15.5)
RDW: 13.2 % (ref 11.5–15.5)
WBC: 7.8 10*3/uL (ref 4.0–10.5)
WBC: 8.1 10*3/uL (ref 4.0–10.5)

## 2010-04-04 LAB — URINALYSIS, ROUTINE W REFLEX MICROSCOPIC
Bilirubin Urine: NEGATIVE
Glucose, UA: >1000 mg/dL — AB
Leukocytes, UA: NEGATIVE
Nitrite: NEGATIVE
Protein, ur: 100 mg/dL — AB
Specific Gravity, Urine: 1.015 (ref 1.005–1.030)
Urobilinogen, UA: 0.2 mg/dL (ref 0.0–1.0)
pH: 5.5 (ref 5.0–8.0)

## 2010-04-04 LAB — GLUCOSE, CAPILLARY: Glucose-Capillary: 449 mg/dL — ABNORMAL HIGH (ref 70–99)

## 2010-04-04 LAB — WET PREP, GENITAL
Trich, Wet Prep: NONE SEEN
Yeast Wet Prep HPF POC: NONE SEEN

## 2010-04-04 LAB — BASIC METABOLIC PANEL
BUN: 14 mg/dL (ref 6–23)
BUN: 9 mg/dL (ref 6–23)
CO2: 25 mEq/L (ref 19–32)
CO2: 26 mEq/L (ref 19–32)
Calcium: 9 mg/dL (ref 8.4–10.5)
Calcium: 9 mg/dL (ref 8.4–10.5)
Chloride: 100 mEq/L (ref 96–112)
Chloride: 102 mEq/L (ref 96–112)
Creatinine, Ser: 0.56 mg/dL (ref 0.4–1.2)
Creatinine, Ser: 0.79 mg/dL (ref 0.4–1.2)
GFR calc Af Amer: >60 mL/min (ref >60)
GFR calc Af Amer: >60 mL/min (ref >60)
GFR calc non Af Amer: >60 mL/min (ref >60)
GFR calc non Af Amer: >60 mL/min (ref >60)
Glucose, Bld: 338 mg/dL — ABNORMAL HIGH (ref 70–99)
Glucose, Bld: 489 mg/dL — ABNORMAL HIGH (ref 70–99)
Potassium: 4.3 mEq/L (ref 3.5–5.1)
Potassium: 4.7 mEq/L (ref 3.5–5.1)
Sodium: 134 mEq/L — ABNORMAL LOW (ref 135–145)
Sodium: 135 mEq/L (ref 135–145)

## 2010-04-04 LAB — ETHANOL: Alcohol, Ethyl (B): <5 mg/dL (ref 0–10)

## 2010-04-04 LAB — PREGNANCY, URINE
Preg Test, Ur: NEGATIVE
Preg Test, Ur: NEGATIVE

## 2010-04-04 LAB — RAPID URINE DRUG SCREEN, HOSP PERFORMED
Amphetamines: NOT DETECTED
Barbiturates: NOT DETECTED
Benzodiazepines: POSITIVE — AB
Cocaine: NOT DETECTED
Opiates: POSITIVE — AB
Tetrahydrocannabinol: POSITIVE — AB

## 2010-04-04 LAB — URINE MICROSCOPIC-ADD ON

## 2010-04-04 LAB — GC/CHLAMYDIA PROBE AMP, GENITAL
Chlamydia, DNA Probe: NEGATIVE
GC Probe Amp, Genital: NEGATIVE

## 2010-04-04 NOTE — Progress Notes (Signed)
  [name], FITTS                ACCOUNT NO.:  [id number]  MEDICAL RECORD NO.:  [id number]           PATIENT TYPE:  I  LOCATION:  A212                          FACILITY:  [facility]  PHYSICIAN:  Davante Gerke L. Luan Pulling, M.D.DATE OF BIRTH:  03/12/1970  DATE OF PROCEDURE: DATE OF DISCHARGE:  03/25/2010                                PROGRESS NOTE   Ms. Georgeff is feeling better.  Her blood sugar is up this morning up to 432, but she was taken off of her long-acting insulin yesterday. Otherwise, she is doing well and has no new complaints and she wants to be discharged home.  We discussed the fact that she had cocaine in her urine drug screen.  She says that was a one-time thing that she promises she will not use any further illicit drugs.  I have told her that I can give her pain medication, but that she will have to get it monthly and come in for urine drug screens, she understands.  She also wants to try to stop smoking.  Otherwise, I think she is doing better.  We can put her back on some long-acting insulin.  I do not think she needs to stay in the hospital because of the blood sugar of 432, but I do want her to followup with Dr. Dorris Fetch for further evaluation and treatment of her blood sugar.  Please see discharge summary for details.     Mykael Trott L. Luan Pulling, M.D.     ELH/MEDQ  D:  03/25/2010  T:  03/25/2010  Job:  [id number]  Electronically Signed by Sinda Du M.D. on 04/04/2010 10:41:02 AM

## 2010-04-04 NOTE — Discharge Summary (Signed)
[name], [name]                ACCOUNT NO.:  [id number]  MEDICAL RECORD NO.:  [id number]           PATIENT TYPE:  I  LOCATION:  A212                          FACILITY:  [facility]  PHYSICIAN:  Erman Thum L. Luan Pulling, M.D.DATE OF BIRTH:  1970-09-10  DATE OF ADMISSION:  03/21/2010 DATE OF DISCHARGE:  03/07/2012LH                              DISCHARGE SUMMARY   FINAL DISCHARGE DIAGNOSES: 1. Uncontrolled diabetes. 2. Edema without definite evidence of diabetes. 3. Recent history of cocaine use. 4. Anxiety and depression. 5. History of significant dental abnormalities.  [name] is a 40 year old who came to the emergency room with complaints of facial swelling.  At that point, she was found to have hyperglycemia of about 600.  She says that she has gained about 40 pounds.  She has had puffy face, but did not have much lower extremity edema.  Exam showed blood pressure 121/69, pulse 89, temperature was 98.1.  She did have some slight periorbital hyperemia.  She had multiple missing teeth.  Her chest showed some rales in bases.  White blood count was 6000, hemoglobin 13.9, and A1c was 11.  TSH normal.  Urine culture essentially normal.  Her urine drug screen showed that she had cocaine in her urine.  She says this is one-time use.  She was treated with insulin and improved.  She had an echocardiogram which did not show systolic congestive heart failure, but did show some grade II diastolic dysfunction.  By the time of discharge, she was much improved.  Her sugars had come under good control but her long-acting insulin had been held, so the morning of discharge her blood sugar was back up to 432. She did not show any evidence of DKA.  She appeared to be doing better in general and she was able to be discharged home.  She is going to be discharged home on a carbohydrate modified moderate-calorie restricted diet.  1. Tylenol 500 mg q.6 h p.r.n. pain or fever. 2. Zoloft 50 mg daily. 3.  Nicotine patch 21 mg x30 days. 4. She will continue with Xanax 1 mg three times a day as needed for     anxiety. 5. She will continue with Lantus 18 units daily. 6. She has Humulin R that she is doing as a sliding scale but I am     also going to have her to take 5 units with each meal. 7. She will continue with hydrocodone 7.5/325 q.6 h. p.r.n.  I have told that she should expect intensive drug testing and if I find that she is using illicit drugs again I will discharge her from my practice.  Otherwise the only other new medication will be Lasix 20 mg daily.  She will continue on her diabetic diet.  She will follow up in my office and she will follow up with Dr. Dorris Fetch.  I think is Dr. Dorris Fetch will be able to help [name] get her on to a more rational program for her diabetes, and I will have her followup in my office, she has an appointment in about 2 weeks.     Jaki Steptoe L.  Luan Pulling, M.D.     ELH/MEDQ  D:  03/25/2010  T:  03/25/2010  Job:  [id number]  cc:   [name], MD Fax: [phone]  Electronically Signed by Sinda Du M.D. on 04/04/2010 10:40:20 AM

## 2010-04-04 NOTE — Progress Notes (Signed)
  NAMEBRYAN, MOLDENHAUER                ACCOUNT NO.:  [id number]  MEDICAL RECORD NO.:  [id number]           PATIENT TYPE:  I  LOCATION:  A212                          FACILITY:  [facility]  PHYSICIAN:  Rhianne Soman L. Luan Pulling, M.D.DATE OF BIRTH:  1970/02/14  DATE OF PROCEDURE: DATE OF DISCHARGE:                                PROGRESS NOTE   Ms. Piccirilli was admitted with uncontrolled blood sugar and edema.  Her edema is somewhat puzzling because her 2-D echo showed normal left ventricular systolic function.  There was some diastolic dysfunction. She probably does have some fairly significant COPD.  I am going to measure her oxygen level and see if that may be part of the problem. Her hemoglobin A1c was 11.  I discussed with her today the fact that she had cocaine in her urine and explained to her that if she continues her use of illicit drugs that I will no longer continue to function as her physician.  She has been very erratic at follow up and I have told her that she is going to have to follow up routinely and stop her use of illicit drugs.  I will drug test her routinely and if I find any evidence of cocaine, I will dismiss her from my practice.  PHYSICAL EXAMINATION:  VITAL SIGNS:  Her exam otherwise, her temperature is 97.2, pulse 75, respirations 20, blood pressure 109/67, O2 sats 96% on room air, would check it again later. HEART:  Regular. ABDOMEN:  Soft. EXTREMITIES:  Showed no edema now.  Her blood sugar has been better and in fact was a little bit low this morning and that makes me wonder if she is actually taking her insulin at home.  She has been 131 at 1643 yesterday, and then 55 at 1936 yesterday, now up to 101.  Assessment then she has uncontrolled diabetes which is better.  She has swelling.  The etiology of this is not clear.  She has grade 2 diastolic dysfunction and plan will be to continue her treatments, try to get her up and moving around.  She may be able to go home  soon.     Concepcion Kirkpatrick L. Luan Pulling, M.D.     ELH/MEDQ  D:  03/24/2010  T:  03/24/2010  Job:  [id number]  Electronically Signed by Sinda Du M.D. on 04/04/2010 10:40:30 AM

## 2010-04-12 LAB — BASIC METABOLIC PANEL
BUN: 11 mg/dL (ref 6–23)
CO2: 25 mEq/L (ref 19–32)
Calcium: 8.7 mg/dL (ref 8.4–10.5)
Chloride: 100 mEq/L (ref 96–112)
Creatinine, Ser: 0.69 mg/dL (ref 0.4–1.2)
GFR calc Af Amer: >60 mL/min (ref >60)
GFR calc non Af Amer: >60 mL/min (ref >60)
Glucose, Bld: 337 mg/dL — ABNORMAL HIGH (ref 70–99)
Potassium: 3.9 mEq/L (ref 3.5–5.1)
Sodium: 133 mEq/L — ABNORMAL LOW (ref 135–145)

## 2010-04-12 LAB — CBC
HCT: 42.7 % (ref 36.0–46.0)
Hemoglobin: 15 g/dL (ref 12.0–15.0)
MCHC: 35 g/dL (ref 30.0–36.0)
MCV: 92.4 fL (ref 78.0–100.0)
Platelets: 316 10*3/uL (ref 150–400)
RBC: 4.62 MIL/uL (ref 3.87–5.11)
RDW: 13.8 % (ref 11.5–15.5)
WBC: 8.4 10*3/uL (ref 4.0–10.5)

## 2010-04-12 LAB — DIFFERENTIAL
Basophils Absolute: 0.1 10*3/uL (ref 0.0–0.1)
Basophils Relative: 1 % (ref 0–1)
Eosinophils Absolute: 0.2 10*3/uL (ref 0.0–0.7)
Eosinophils Relative: 2 % (ref 0–5)
Lymphocytes Relative: 25 % (ref 12–46)
Lymphs Abs: 2.1 10*3/uL (ref 0.7–4.0)
Monocytes Absolute: 0.7 10*3/uL (ref 0.1–1.0)
Monocytes Relative: 8 % (ref 3–12)
Neutro Abs: 5.4 10*3/uL (ref 1.7–7.7)
Neutrophils Relative %: 64 % (ref 43–77)

## 2010-04-12 LAB — RAPID URINE DRUG SCREEN, HOSP PERFORMED
Amphetamines: NOT DETECTED
Barbiturates: NOT DETECTED
Benzodiazepines: POSITIVE — AB
Cocaine: POSITIVE — AB
Opiates: POSITIVE — AB
Tetrahydrocannabinol: POSITIVE — AB

## 2010-04-12 LAB — GLUCOSE, CAPILLARY
Glucose-Capillary: 137 mg/dL — ABNORMAL HIGH (ref 70–99)
Glucose-Capillary: 147 mg/dL — ABNORMAL HIGH (ref 70–99)
Glucose-Capillary: 21 mg/dL — CL (ref 70–99)
Glucose-Capillary: 271 mg/dL — ABNORMAL HIGH (ref 70–99)
Glucose-Capillary: 280 mg/dL — ABNORMAL HIGH (ref 70–99)

## 2010-04-12 LAB — BLOOD GAS, ARTERIAL
Acid-Base Excess: 1.6 mmol/L (ref 0.0–2.0)
Bicarbonate: 25.8 mEq/L — ABNORMAL HIGH (ref 20.0–24.0)
O2 Content: 21 L/min
O2 Saturation: 86.6 %
TCO2: 22.7 mmol/L (ref 0–100)
pCO2 arterial: 41.8 mmHg (ref 35.0–45.0)
pH, Arterial: 7.407 — ABNORMAL HIGH (ref 7.350–7.400)
pO2, Arterial: 51.3 mmHg — ABNORMAL LOW (ref 80.0–100.0)

## 2010-04-12 LAB — ETHANOL: Alcohol, Ethyl (B): <5 mg/dL (ref 0–10)

## 2010-04-13 LAB — GLUCOSE, CAPILLARY
Glucose-Capillary: 104 mg/dL — ABNORMAL HIGH (ref 70–99)
Glucose-Capillary: 127 mg/dL — ABNORMAL HIGH (ref 70–99)
Glucose-Capillary: 127 mg/dL — ABNORMAL HIGH (ref 70–99)
Glucose-Capillary: 166 mg/dL — ABNORMAL HIGH (ref 70–99)
Glucose-Capillary: 19 mg/dL — CL (ref 70–99)
Glucose-Capillary: 204 mg/dL — ABNORMAL HIGH (ref 70–99)
Glucose-Capillary: 218 mg/dL — ABNORMAL HIGH (ref 70–99)
Glucose-Capillary: 256 mg/dL — ABNORMAL HIGH (ref 70–99)
Glucose-Capillary: 296 mg/dL — ABNORMAL HIGH (ref 70–99)
Glucose-Capillary: 321 mg/dL — ABNORMAL HIGH (ref 70–99)
Glucose-Capillary: 325 mg/dL — ABNORMAL HIGH (ref 70–99)
Glucose-Capillary: 326 mg/dL — ABNORMAL HIGH (ref 70–99)
Glucose-Capillary: 353 mg/dL — ABNORMAL HIGH (ref 70–99)
Glucose-Capillary: 463 mg/dL — ABNORMAL HIGH (ref 70–99)
Glucose-Capillary: 48 mg/dL — ABNORMAL LOW (ref 70–99)
Glucose-Capillary: 60 mg/dL — ABNORMAL LOW (ref 70–99)
Glucose-Capillary: 77 mg/dL (ref 70–99)

## 2010-04-13 LAB — LIPID PANEL
Cholesterol: 228 mg/dL — ABNORMAL HIGH (ref 0–200)
HDL: 45 mg/dL (ref >39)
LDL Cholesterol: 144 mg/dL — ABNORMAL HIGH (ref 0–99)
Total CHOL/HDL Ratio: 5.1 RATIO
Triglycerides: 194 mg/dL — ABNORMAL HIGH (ref <150)
VLDL: 39 mg/dL (ref 0–40)

## 2010-04-13 LAB — TSH: TSH: 0.819 u[IU]/mL (ref 0.350–4.500)

## 2010-04-13 LAB — HEMOGLOBIN A1C
Hgb A1c MFr Bld: 9.8 % — ABNORMAL HIGH (ref 4.6–6.1)
Mean Plasma Glucose: 235 mg/dL

## 2010-04-22 LAB — COMPREHENSIVE METABOLIC PANEL
ALT: 10 U/L (ref 0–35)
AST: 15 U/L (ref 0–37)
Albumin: 3.4 g/dL — ABNORMAL LOW (ref 3.5–5.2)
Alkaline Phosphatase: 54 U/L (ref 39–117)
BUN: 6 mg/dL (ref 6–23)
CO2: 30 mEq/L (ref 19–32)
Calcium: 9.1 mg/dL (ref 8.4–10.5)
Chloride: 103 mEq/L (ref 96–112)
Creatinine, Ser: 0.7 mg/dL (ref 0.4–1.2)
GFR calc Af Amer: >60 mL/min (ref >60)
GFR calc non Af Amer: >60 mL/min (ref >60)
Glucose, Bld: 366 mg/dL — ABNORMAL HIGH (ref 70–99)
Potassium: 3.9 mEq/L (ref 3.5–5.1)
Sodium: 137 mEq/L (ref 135–145)
Total Bilirubin: 0.2 mg/dL — ABNORMAL LOW (ref 0.3–1.2)
Total Protein: 6.3 g/dL (ref 6.0–8.3)

## 2010-04-22 LAB — CBC
HCT: 38.6 % (ref 36.0–46.0)
Hemoglobin: 13.5 g/dL (ref 12.0–15.0)
MCHC: 35.1 g/dL (ref 30.0–36.0)
MCV: 94.9 fL (ref 78.0–100.0)
Platelets: 287 10*3/uL (ref 150–400)
RBC: 4.07 MIL/uL (ref 3.87–5.11)
RDW: 12.9 % (ref 11.5–15.5)
WBC: 6 10*3/uL (ref 4.0–10.5)

## 2010-04-22 LAB — POCT CARDIAC MARKERS
CKMB, poc: <1 ng/mL — ABNORMAL LOW (ref 1.0–8.0)
CKMB, poc: <1 ng/mL — ABNORMAL LOW (ref 1.0–8.0)
Myoglobin, poc: 27.7 ng/mL (ref 12–200)
Myoglobin, poc: 33.8 ng/mL (ref 12–200)
Troponin i, poc: <0.05 ng/mL (ref 0.00–0.09)
Troponin i, poc: <0.05 ng/mL (ref 0.00–0.09)

## 2010-04-22 LAB — DIFFERENTIAL
Basophils Absolute: 0.1 10*3/uL (ref 0.0–0.1)
Basophils Relative: 1 % (ref 0–1)
Eosinophils Absolute: 0.1 10*3/uL (ref 0.0–0.7)
Eosinophils Relative: 2 % (ref 0–5)
Lymphocytes Relative: 28 % (ref 12–46)
Lymphs Abs: 1.7 10*3/uL (ref 0.7–4.0)
Monocytes Absolute: 0.4 10*3/uL (ref 0.1–1.0)
Monocytes Relative: 7 % (ref 3–12)
Neutro Abs: 3.7 10*3/uL (ref 1.7–7.7)
Neutrophils Relative %: 61 % (ref 43–77)

## 2010-04-22 LAB — D-DIMER, QUANTITATIVE: D-Dimer, Quant: 0.8 ug/mL-FEU — ABNORMAL HIGH (ref 0.00–0.48)

## 2010-04-29 LAB — DIFFERENTIAL
Basophils Absolute: 0 10*3/uL (ref 0.0–0.1)
Basophils Relative: 0 % (ref 0–1)
Eosinophils Absolute: 0.1 10*3/uL (ref 0.0–0.7)
Eosinophils Relative: 1 % (ref 0–5)
Lymphocytes Relative: 20 % (ref 12–46)
Lymphs Abs: 1.2 10*3/uL (ref 0.7–4.0)
Monocytes Absolute: 0.5 10*3/uL (ref 0.1–1.0)
Monocytes Relative: 7 % (ref 3–12)
Neutro Abs: 4.5 10*3/uL (ref 1.7–7.7)
Neutrophils Relative %: 72 % (ref 43–77)

## 2010-04-29 LAB — GLUCOSE, CAPILLARY
Glucose-Capillary: 135 mg/dL — ABNORMAL HIGH (ref 70–99)
Glucose-Capillary: 267 mg/dL — ABNORMAL HIGH (ref 70–99)
Glucose-Capillary: 32 mg/dL — CL (ref 70–99)

## 2010-04-29 LAB — BASIC METABOLIC PANEL
BUN: 9 mg/dL (ref 6–23)
CO2: 26 mEq/L (ref 19–32)
Calcium: 8.6 mg/dL (ref 8.4–10.5)
Chloride: 105 mEq/L (ref 96–112)
Creatinine, Ser: 0.56 mg/dL (ref 0.4–1.2)
GFR calc Af Amer: >60 mL/min (ref >60)
GFR calc non Af Amer: >60 mL/min (ref >60)
Glucose, Bld: 101 mg/dL — ABNORMAL HIGH (ref 70–99)
Potassium: 3.2 mEq/L — ABNORMAL LOW (ref 3.5–5.1)
Sodium: 139 mEq/L (ref 135–145)

## 2010-04-29 LAB — URINE MICROSCOPIC-ADD ON

## 2010-04-29 LAB — GC/CHLAMYDIA PROBE AMP, GENITAL
Chlamydia, DNA Probe: NEGATIVE
GC Probe Amp, Genital: NEGATIVE

## 2010-04-29 LAB — URINALYSIS, ROUTINE W REFLEX MICROSCOPIC
Glucose, UA: NEGATIVE mg/dL
Hgb urine dipstick: NEGATIVE
Leukocytes, UA: NEGATIVE
Nitrite: NEGATIVE
Protein, ur: >300 mg/dL — AB
Specific Gravity, Urine: 1.025 (ref 1.005–1.030)
Urobilinogen, UA: 1 mg/dL (ref 0.0–1.0)
pH: 6 (ref 5.0–8.0)

## 2010-04-29 LAB — PREGNANCY, URINE: Preg Test, Ur: NEGATIVE

## 2010-04-29 LAB — CBC
HCT: 41.5 % (ref 36.0–46.0)
Hemoglobin: 14.4 g/dL (ref 12.0–15.0)
MCHC: 34.8 g/dL (ref 30.0–36.0)
MCV: 93.3 fL (ref 78.0–100.0)
Platelets: 287 10*3/uL (ref 150–400)
RBC: 4.45 MIL/uL (ref 3.87–5.11)
RDW: 13.2 % (ref 11.5–15.5)
WBC: 6.3 10*3/uL (ref 4.0–10.5)

## 2010-04-29 LAB — WET PREP, GENITAL
Trich, Wet Prep: NONE SEEN
Yeast Wet Prep HPF POC: NONE SEEN

## 2010-05-04 NOTE — H&P (Signed)
[name], [name]                ACCOUNT NO.:  [id number]  MEDICAL RECORD NO.:  [id number]           PATIENT TYPE:  I  LOCATION:  IC01                          FACILITY:  [facility]  PHYSICIAN:  Britain Anagnos, MD DATE OF BIRTH:  [birth year]  DATE OF ADMISSION:  03/21/2010 DATE OF DISCHARGE:  LH                             HISTORY & PHYSICAL   REASON FOR ADMISSION:  Hyperglycemia, wheezing, PND, and diabetic ketoacidosis.  HISTORY OF PRESENT ILLNESS:  This is a 40 year old female patient with medical history significant for type 1 diabetes since age 25, on chronic insulin therapy utilizing Lantus and regular insulin.  She gives history of repeated hospitalizations for diabetic ketoacidosis.  She came yesterday with complaint of mild facial swelling; however, was found to have severe hyperglycemia in the range of 600 with no ketosis.  She was hospitalized for glycemic control.  Of note, she provided history significant for weight gain of approximately 40 pounds in the last 2 weeks associated with puffy face with no lower extremity edema.  She denied any chest pain or shortness of breath.  She reported that she used her Lantus 18 units b.i.d. and regular insulin per sliding scale.  PAST MEDICAL HISTORY:  Significant for, 1. Type 1 diabetes. 2. Polysubstance abuse. 3. Depression.  HOME MEDICATIONS: 1. Zoloft 50 mg daily. 2. Lantus 18 units b.i.d. 3. Regular insulin sliding scale. 4. Xanax 1 mg 3 times a day.  FAMILY HISTORY:  Significant for coronary artery disease and CHF in both parents.  SOCIAL HISTORY:  Significant for cocaine abuse, marijuana abuse, and was found to be cocaine positive during this hospitalization.  REVIEW OF SYSTEMS:  As in HPI.  All others are reviewed and negative.  PHYSICAL EXAMINATION:  GENERAL:  She is alert and oriented x3 status post insulin infusion with glycemic control. VITAL SIGNS:  Her blood pressure is 121/69, pulse rate is  89, temperature 98.1. HEENT:  Moist mucous membranes. NECK:  Negative for JVD or thyromegaly. HEENT:  Significant for bilateral deficits with slight periorbital hyperemia. CHEST:  Clear to auscultation bilaterally except for bilateral rales in lung fields. CARDIOVASCULAR:  Distant heart sounds.  No murmur, no gallop. ABDOMEN:  Slightly distended with no tenderness or guarding. EXTREMITIES:  No peripheral edema. CNS:  Nonfocal. SKIN:  No rash, no hyperemia.  LABORATORY DATA:  WBC 6, hemoglobin 13.9, hematocrit 43.7, platelet count 386.  Chemistry showed glucose in the range of 108 to 675, sodium 137, potassium 3.8, chloride 105, bicarb 29, BUN 12, creatinine 0.6. A1c unknown.  Thyroid function test unknown.  ASSESSMENT AND PLAN: 1. Type 1 diabetes with chronic uncontrolled status presenting with     hyperglycemia with impending DKA.  The patient is currently status     post IV insulin therapy with stabilization of glycemia to near     normal.  I will proceed with insulin using Lantus 20 units nightly     and NovoLog 10 units t.i.d. before meals plus moderate dose sliding     scale.  I will proceed to obtain hemoglobin A1c and thyroid     function tests.  The patient is counseled regarding the need to     control diabetes to avoid complications including coronary artery     disease, CKD, CVA, and radiculopathy. 2. Body swelling, etiology unknown.  We will rule out CHF by ordering     echocardiogram. 3. Diet and nutrition.  We will provide 1800 carbohydrate-restricted     diet. 4. Deep venous thrombosis prophylaxis with Lovenox 40 mg subcu daily.                                           ______________________________ [name], MD     GN/MEDQ  D:  03/22/2010  T:  03/23/2010  Job:  [id number]  Electronically Signed by [name] MD on 05/04/2010 09:07:47 AM

## 2010-05-05 LAB — GLUCOSE, CAPILLARY: Glucose-Capillary: 404 mg/dL — ABNORMAL HIGH (ref 70–99)

## 2010-06-02 NOTE — H&P (Signed)
[name], [name]                ACCOUNT NO.:  [id number]   MEDICAL RECORD NO.:  [id number]          PATIENT TYPE:  OBV   LOCATION:  A301                          FACILITY:  [facility]   PHYSICIAN:  Edward L. Luan Pulling, M.D.DATE OF BIRTH:  1970/08/29   DATE OF ADMISSION:  11/14/2007  DATE OF DISCHARGE:  LH                              HISTORY & PHYSICAL   Ms. Badman is a 40 year old with a long-known history of multiple  medical problems, mostly dominated by diabetes, and who came to my  office with severe pain in her head and her arm, and numbness in her  arm, and she was transferred from there to the emergency room.  She said  that she was having slurred speech and difficulty walking at that time  that later resolved spontaneously.  She said that she has a headache,  which is like her migraines, and began vomiting.   PAST MEDICAL HISTORY:  Positive for diabetes, glomerulonephritis,  hyperlipidemia, migraine headaches, and she has seen Dr. Merlene Laughter, the  neurologist, about that.   FAMILY HISTORY:  Positive for coronary artery disease, diabetes, stroke.   She has had a tubal ligation, benign breast mass.   SOCIAL HISTORY:  She does not use alcohol.  Does not use any illicit  drugs.  She is married and lives at home with her husband.  She does  smoke about a pack or so of cigarettes.   CURRENT MEDICATIONS:  1. Levemir 15 units in the morning.  2. At night a sliding scale of regular insulin.  3. Xanax 0.5 b.i.d.  4. Lexapro 10 mg daily.   PHYSICAL EXAMINATION:  CONSTITUTIONAL:  She is awake and alert.  She  says that she is much better after treatment in the emergency room.  VITAL SIGNS:  Temperature is 98.1, pulse 70, respirations 18, blood  pressure 101/59.   DIAGNOSTIC STUDIES:  Blood sugars are high at 308-275.  Potassium was  6.5 on admission, but she had a glucose of 441, CO2 26.  White count  13,100, hemoglobin 14.6.  Platelets 501,000.   ASSESSMENT:  She is having what  appears to be a migraine headache.  She  also has uncontrolled blood sugars right now.  I think some of that is  related to her migraine.  Her potassium level was up.  We are going to  repeat that this morning.  There is some possibility, since she is  better, she could go home.  I am going to try to see if I can get Dr.  Merlene Laughter back involved in her care.      Edward L. Luan Pulling, M.D.  Electronically Signed     ELH/MEDQ  D:  11/15/2007  T:  11/15/2007  Job:  [id number]

## 2010-06-05 NOTE — Discharge Summary (Signed)
NAMEVICTORIOUS, [name]                ACCOUNT NO.:  [id number]   MEDICAL RECORD NO.:  [id number]          PATIENT TYPE:  INP   LOCATION:  A332                          FACILITY:  [facility]   PHYSICIAN:  Vernon Prey. [name], M.D.   DATE OF BIRTH:  1970/07/30   DATE OF ADMISSION:  09/09/2004  DATE OF DISCHARGE:  08/25/2006LH                                 DISCHARGE SUMMARY   DISCHARGE DIAGNOSES:  1.  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  2.  Uncontrolled diabetes mellitus.  3.  Chronic glomerular nephritis.  4.  Depression with anxiety.   PROCEDURE:  Right partial mastectomy on August 25.   DISPOSITION:  The patient is discharged home after recovery on the day of  surgery.   FOLLOW UP:  She will have follow up in the office on September 6.   DISCHARGE MEDICATIONS:  1.  Tylox two every 4 hours as needed.  2.  Doxycycline 100 mg twice daily x5 days.  3.  Lexapro 10 mg daily.  4.  Xanax 0.5 mg twice daily.  5.  Humulin R 15 units in the morning and 5 units with noon meal, Humulin N      20 units with noon meal.   HOSPITAL COURSE:  [name] with diabetes and glomerular  nephritis who presented with a painful mass of the right breast.  She had a  painful right breast mass for over a year.  She underwent mammogram,  ultrasound and core biopsy.  She was told that the mass had scar tissue,  however, the mass continued to enlarge and the breast became more painful.  She was seen on the day of admission by Dr. Elonda Husky and was admitted because of  concern for breast abscess.  She had a large, multilobulated mass which  involved the inferior portion of her right breast.  She was started on  antibiotics and consultation was obtained.  Other history includes history  of juvenile diabetes mellitus with onset at age 10 which has been very  brittle throughout her life.  She also has chronic glomerular nephritis.   On exam, she had a nodular enlarged, engorged, right  breast with very firm  palpable mass in the right lower outer quadrant extending to the infra-  areolar and essential areolar area with another nodule in the right lower  inner quadrant.  There was thickness of entire breast, but no evidence of  nipple retraction or other skin changes.  The patient had ultrasound which  was done diagnostic.  Because of concern of the mass, it was decided to  proceed with partial mastectomy.  She had hypokalemia and received  intravenous potassium preoperatively.  On August 25, she underwent the right  partial mastectomy with findings of diffuse, chronic, fibrotic inflammatory  changes in the right lower outer quadrant of her breast with some extension  into the right lower inner quadrant.  A very generous partial  mastectomy was done with partial closure.  The patient did well and on the  evening of the day of surgery, she wished to  be discharged home.  It was  felt that since she was not going to need any further IV medications that  she could be followed as an outpatient.  She was discharged home in  satisfactory condition.      Vernon Prey. [name], M.D.  Electronically Signed     LCS/MEDQ  D:  11/08/2004  T:  11/09/2004  Job:  [id number]

## 2010-06-05 NOTE — H&P (Signed)
[name], [name]                ACCOUNT NO.:  [id number]   MEDICAL RECORD NO.:  [id number]          PATIENT TYPE:  OBV   LOCATION:  A301                          FACILITY:  [facility]   PHYSICIAN:  Edward L. Luan Pulling, M.D.DATE OF BIRTH:  Jan 04, 1971   DATE OF ADMISSION:  11/14/2007  DATE OF DISCHARGE:  10/28/2009LH                              HISTORY & PHYSICAL   ADDENDUM   PHYSICAL EXAMINATION:  HEENT:  Her pupils were reactive.  Nose and  throat were clear.  NECK:  Supple without masses.  CHEST:  Clear.  HEART:  Regular without murmur, gallop, or rub.  ABDOMEN:  Soft.  No masses felt.  EXTREMITIES:  No edema.  CNS:  After she had resolved most of the migraine, was normal with no  hemiplegia.  No other abnormalities.      Edward L. Luan Pulling, M.D.  Electronically Signed     ELH/MEDQ  D:  11/16/2007  T:  11/17/2007  Job:  [id number]

## 2010-06-05 NOTE — H&P (Signed)
[name], [name]                ACCOUNT NO.:  [id number]   MEDICAL RECORD NO.:  [id number]           PATIENT TYPE:  INP   LOCATION:  [id number]                          FACILITY:  [facility]   PHYSICIAN:  Florian Buff, M.D.   DATE OF BIRTH:  1970-07-20   DATE OF ADMISSION:  09/09/2004  DATE OF DISCHARGE:  LH                                HISTORY & PHYSICAL   REASON FOR ADMISSION:  [name] is a 40 year old white female who is being  admitted to the hospital for fever and probable right breast abscess.   HISTORY OF PRESENT ILLNESS:  She states that approximately 6 months ago she  had a mammogram; it was approximately the size of a pea, but within the last  3-4 weeks it has gotten very large, very painful, it is warm to touch, and  she is now febrile on and off and nauseated due to the pain.   MEDICAL HISTORY:  1.  Diabetes.  2.  Renal disease.   SURGICAL HISTORY:  1.  Bilateral tubal ligation.  2.  Broken left arm.   FAMILY HISTORY:  Positive for cancer, heart disease, and diabetes.   PHYSICAL EXAMINATION:  HEART:  Regular rate and rhythm.  LUNGS:  Clear to auscultation bilaterally.  BREAST EXAM:  There is a 2 cm nontender nodule at 5 to 6 o'clock in the left  breast about 3 cm below the nipple.  In the right breast, there is a 5 cm  cystic lesion that is very tender to palpate.  The patient is very guarded  of this legion.  It is also warm to touch.  There is no redness noted.   PLAN:  Dr. Elonda Husky is going to admit the patient, get a surgical consult, place  her on IV antibiotics and pain management, and will follow up with her this  evening and in the morning.     Daiva Nakayama, N.M.      Florian Buff, M.D.  Electronically Signed   DL/MEDQ  D:  09/09/2004  T:  09/09/2004  Job:  [id number]

## 2010-06-05 NOTE — H&P (Signed)
[facility]  Patient:    [name], [name] Visit Number: [id number] MRN: [id number]          Service Type: MED Location: 3A A313 01 Attending Physician:  Bonne Dolores Dictated by:   Bonne Dolores, M.D. Admit Date:  04/01/2001                           History and Physical  CHIEF COMPLAINT:  Headache.  HISTORY OF PRESENT ILLNESS:  This is a 40 year old female with a history of juvenile onset diabetes, onset at age 68.  She also has a history of glomerulonephritis and migraines for the past two years.  She is status post tubal ligation.  Last menstrual period was January 2003.  She states it is not unusual for her to miss a month.  The patient presented to the office three days prior to admission with nausea and vomiting.  She responded well to Phenergan, and her symptoms resolved. She awoke approximately 4 a.m. on the morning of admission with left frontoparietal as well as occipital headache.  This was of pounding quality. It was associated with nausea, vomiting, and photophobia.  She had had similar episodes in the past since onset of her migraines two years ago.  The patient presented to the emergency department for evaluation.  A CT scan was obtained which was benign.  Lumbar puncture was attempted, though unsuccessful.  Laboratory review was benign except for trace hemoglobin in urine, negative protein in urine.  Urine pregnancy test was negative.  Chem-12 was normal except for calcium 8.2, total protein 5.0, and albumin 2.2.  CBC normal except for slight left shift of doubtful significance.  CT, as mentioned, was negative except for ethmoid sinus disease.  In the emergency department the patient was administered Demerol with some relief of her symptoms.  She was admitted for ongoing pain control and for further workup and evaluation as indicated.  There is no history of recent head trauma, neurologic deficits, fever, chills, URI symptoms,  neck pain, chest pain, shortness of breath, cough, nausea, vomiting, diarrhea, melena, hematemesis, hematochezia, or genitourinary symptoms.  Of note, pelvic exam in the ED was benign.  CURRENT MEDICATIONS: 1. Insulin Humulin N 20 units q.a.m. and q.h.s. at bedtime. 2. Humulin R 8-10 in the morning and the same in the evening. 3. She has been maintained on Lexapro. 4. Xanax.  ALLERGIES:  None known.  PAST MEDICAL HISTORY:  As noted above.  She has a history of anxiety and depression.  SOCIAL HISTORY:  Nonsmoker, nondrinker.  Married.  FAMILY HISTORY:  Noncontributory.  REVIEW OF SYSTEMS:  Negative except as mentioned.  PHYSICAL EXAMINATION:  GENERAL:  Very pleasant, fully alert and oriented female who at this point is in no acute distress.  VITAL SIGNS:  At presentation temperature 98.1, pulse 91 and regular, respirations 18, blood pressure 114/75.  HEENT:  Normocephalic, atraumatic.  Pupils are equal.  Fundi benign except for moderate changes compatible with diabetes history.  Pharynx is normal.  Well hydrated.  Tongue midline.  NECK:  Supple.  No bruits, thyromegaly, lymphadenopathy.  LUNGS:  Clear to A&P.  HEART:  Heart sounds are normal.  Without murmurs, rubs, or gallops.  ABDOMEN:  Soft, nontender, nondistended.  Bowel sounds are intact.  EXTREMITIES:  No clubbing, cyanosis, or edema.  NEUROLOGIC:  Totally nonfocal.  ASSESSMENT:  Severe headache in a female with the above history.  Most likely represents recurrent migraine.  Consider  other etiology.  There are no signs whatsoever for meningitis at this time.  Consider vascular anomalies.  PLAN:  MRI/MRA.  Neurology consult.  Pain control.  Ongoing insulin therapy. Will follow and treat expectantly. Dictated by:   Bonne Dolores, M.D. Attending Physician:  Bonne Dolores DD:  04/02/01 TD:  04/03/01 Job: [id number]

## 2010-06-05 NOTE — Discharge Summary (Signed)
[facility]  Patient:    [name], [name] Visit Number: [id number] MRN: [id number]          Service Type: EMS Location: ED Attending Physician:  Hardin Negus Dictated by:   Bonne Dolores, M.D. Admit Date:  04/12/2001 Discharge Date: 04/12/2001                             Discharge Summary  DISCHARGE DIAGNOSES: 1. Severe headache, possibly migraine, negative MRI and MRA, symptoms    resolved. 2. History of drug overuse. 3. Insulin-dependent diabetes mellitus onset age 83.  For details regarding admission please refer to the admitting note.  Briefly this is a 40 year old female with a history of juvenile onset diabetes as well as glomerulonephritis, and "migraines" for the past 2 years.  She is also status post ligation last period January 2003.  The patient presented to the office 3 days prior to admission with nausea and vomiting.  This responded well to Phenergan.  Her symptoms resolved.  She awoke approximately 4 a.m. on the morning of admission with left frontoparietal as well as occipital headache.  This was of pounding quality and was associated with nausea and vomiting and photophobia.  She had had similar episodes in the past and onset of her migraines 2 years prior.  The patient was brought to the emergency department where CT scan was obtained which was benign.  A lumbar puncture was attempted though unsuccessful.  The lab review is essentially benign except for trace hemoglobin and urine. Negative protein and urine.  Urine pregnancy test was negative.  Chem 12 normal except for calcium 8.2, total protein 5.0, and albumin 2.2, CBC normal except for a slight left shift of doubtful significance.  She was administered Demerol with some relief of her symptoms and for ongoing pain control and further work-up and evaluation as indicated.  HOSPITAL COURSE:  The patient required repeated dosing of parenteral narcotics for pain relief.  Dr.  Phillips Odor, was consulted.  He felt that appropriate work-up including MRI MRA was done.  He considered the possibility of meningitis and considered doing an MRI, however, the patient did improve significantly after beginning Elavil.  By the third hospital day her symptoms were essentially resolved and she was discharged in much improved condition.  DISCHARGE MEDICATIONS: 1. Elavil 50 mg q.h.s. 2. p.r.n. hydrocodone.  HOME MEDICATIONS:  She is to continue her home medications which include: 1. Humulin R 8-10 in the morning and the same in the evening. 2. Lexapro  FOLLOWUP:  She will be followed and treated expectantly as an outpatient.Dictated by:   Bonne Dolores, M.D. Attending Physician:  Lanae Crumbly B DD:  05/02/01 TD:  05/02/01 Job: [id number]

## 2010-06-05 NOTE — Consult Note (Signed)
[facility]  Patient:    Lisa, [name] Visit Number: [id number] MRN: [id number]          Service Type: MED Location: 3A A313 01 Attending Physician:  Bonne Dolores Dictated by:   Phillips Odor, M.D. Proc. Date: 04/03/01 Admit Date:  04/01/2001 Discharge Date: 04/04/2001                            Consultation Report  IMPRESSION:  Atypical headaches.  I doubt if she has pseudotumor cerebri. This may atypical presentation of a migraine which I think is the most likely etiology.  So far, most causes of secondary headaches have been ruled out. She could have chronic meningitis and this may be worthwhile evaluating for this.  RECOMMENDATIONS: 1. Amitriptyline. 2. Reglan. 3. Will consider spinal tap if she does not improve.  This will mostly be to    evaluate for chronic meningitis.  BRIEF HISTORY:  This is a 40 year old right-handed Caucasian lady with a 20 year history of diabetes.  Reports not being a headachy person.  Over the past 8 months or so she has had 3 or more headaches that have lasted several days. This has been unusual for her. The current headache has been present for about 3 days.  It is located in the right periorbital temporal region.  It radiates down into the occipital region into the neck.  It is severe.  It has been constant.  It is associated with nausea, photophobia, and phonophobia.  No clear association with visual symptoms.  She has baseline visual symptoms presumably from diabetes.  The patient was given medication in the hospital and they did relieve the headaches but they seemed to return.  She has been imaged. Both CT scan and also MRI scan and MRA of the brain all essentially has been unremarkable. Spinal tap was attempted but was aborted.  She denies any tinnitus.  The headaches are not positionally related.  Headaches may get worse with bending over.  PAST MEDICAL HISTORY:  As stated above.  There is also a  history of glomerulonephritis.  CURRENT MEDICATIONS: 1. Percocet. 2. Insulin. 3. Promethazine p.r.n. 4. Oxycodone p.r.n. 5. Reglan.  SOCIAL HISTORY:  She has a child about 78 years old.  She does have a fiance.  REVIEW OF SYSTEMS:  As stated above.  She does report having some constipation problems.  PHYSICAL EXAMINATION:  GENERAL:  Shows a pleasant lady, mildly overweight, somewhat bradyphrenic. She is conversational and generally is slow to respond.  No language problems. Speech is fine.  NEUROLOGIC:  Cranial nerves II-XII intact.  ______ visual fields on funduscopic examination.  MOTOR EXAMINATION:  Shows normal tone, bulk and strength.  There is no pronator drift.  Coordination is intact.  Reflexes are brisk.  Toes are both downgoing.  SENSORY EXAMINATION:  Normal to temperature and light touch. Dictated by:   Phillips Odor, M.D. Attending Physician:  Bonne Dolores DD:  04/03/01 TD:  04/05/01 Job: [id number]
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
## 2010-06-05 NOTE — Discharge Summary (Signed)
NAMESCOTTIE, Lisa                ACCOUNT NO.:  [id number]   MEDICAL RECORD NO.:  [id number]          PATIENT TYPE:  OBV   LOCATION:  A301                          FACILITY:  [facility]   PHYSICIAN:  Edward L. Luan Pulling, M.D.DATE OF BIRTH:  October 16, 1970   DATE OF ADMISSION:  11/14/2007  DATE OF DISCHARGE:  10/28/2009LH                               DISCHARGE SUMMARY   FINAL DISCHARGE DIAGNOSES:  1. Hemiplegic migraine.  2. Diabetes mellitus, poorly controlled.  3. History of glomerulonephritis.  4. Hyperlipidemia.  5. History of tubal ligation.  6. Hyperkalemia.   HISTORY:  [name] is Lisa 40 year [name] with Lisa long known history of  medical problems mostly diabetes and she came to my office on the day of  admission with complaints of being numb on one side and having Lisa severe  headache.  Because of her concern that she might be having Lisa stroke, she  was taken to the emergency room.  In the emergency room, she was  evaluated and found to have no definite evidence of stroke on CT, but  was found to have probably Lisa hemiplegic migraine.  Her exam showed Lisa  well-developed, well-nourished female who is weak on the left side.  Her  speech was slurred.  Her temperature is 98.1, pulse 70, and respiration  18.  Her potassium is 6.5.   HOSPITAL COURSE:  She was felt to have Lisa migraine headache and she  improved markedly over the next throughout the 14 hours to the point  that she was able to be discharged home.  She is advised to follow back  up with Dr. Randie Heinz who has been helping treat her migraines and she  has not seen him in sometime.  She is going to stay on her regular  medications, which were Levemir 15 units in the morning, Lisa sliding scale  of regular insulin, Xanax 0.5 b.i.d., and Lexapro 10 mg daily.  She has  available other medications.  She has been on some preventive  medications for migraines, but is tolerating them well.      Edward L. Luan Pulling, M.D.  Electronically  Signed     ELH/MEDQ  D:  11/16/2007  T:  11/17/2007  Job:  [id number]
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
## 2010-06-05 NOTE — Consult Note (Signed)
[name], [name]                ACCOUNT NO.:  [id number]   MEDICAL RECORD NO.:  [id number]          PATIENT TYPE:  INP   LOCATION:  A332                          FACILITY:  [facility]   PHYSICIAN:  Vernon Prey. [name], M.D.   DATE OF BIRTH:  [name]   DATE OF CONSULTATION:  09/09/2004  DATE OF DISCHARGE:                                   CONSULTATION   SURGERY CONSULTATION:   REASON FOR CONSULTATION:  This is a 40 year old female with diabetes and  glomerulonephritis who presents with a painful mass of her right breast.  The patient relates that she has had painful right breast for over 1-1/2  years.  She was evaluated at this hospital in January 2005 and underwent  mammogram, ultrasound and subsequent core needle biopsy.  At that time she  was found to have a 1.9 cm irregular mass in the lower inner quadrant of her  right breast.  She was told that she had scar tissue and she noted that the  mass however continued to enlarge and that her breast was more painful.  Since she was told that it was benign she did not have close followup.  She  has not had further mammography or ultrasound.  She was seen by Dr. Elonda Husky  today and was admitted because of the size of the mass which now is quite  large and appears to be multilobulated or multipositional and it involves a  major portion of the inferior portion of the breast.  The right breast is at  least one half size larger than the left.  She has not had fever or chills  that she can remember.  The patient is admitted and has been started on  antibiotics and is scheduled to have an ultrasound later today or tomorrow  morning.   PAST HISTORY:  She has had diabetes mellitus since age 40 and has been very  brittle most of her life.  She had chronic glomerulonephritis but based on  recent lab work she apparently has stable renal function at this time.  There is a history of depression and anxiety.   PREVIOUS SURGERY:  Previous surgery includes a  tubal ligation, open  treatment and internal fixation of a comminuted open distal fracture of the  wrist with bone grafting in May 2005 and she had removal of hardware of the  left wrist December 2005.   MEDICATIONS:  1.  Xanax 0.5 mg b.i.d.  2.  Lexapro 10 mg daily.  3.  Regular Insulin 15 units in the morning with 5 units with the noon meal.  4.  Humulin N 20 units with the noon meal and sliding scale coverage with      Regular Insulin with the evening meal.  She states that her blood sugars      continue to fluctuate very widely.   EXAMINATION:  GENERAL:  She is a pleasant female who does not appear to be  in any distress.  HEENT:  Unremarkable.  NECK:  Supple.  CHEST:  Clear to auscultation.  HEART:  Regular rate and rhythm without  murmur.  BREASTS:  Nodular, enlarged, slightly engorged right breast with very firm  palpable mass in the right lower outer quadrant extending to the infra-  areolar/central areolar area with another nodule in the right lower inner  quadrant.  There is a general thickness of the entire breast but there is no  peau d'orange, no nipple retraction and no other skin changes.  The left  breast is unremarkable, it is very pliable, no dominant mass, no tenderness.  ABDOMEN:  Benign.  EXTREMITIES:  Surgical deformity left wrist with healed scars from previous  surgery otherwise unremarkable.  NEUROLOGIC EXAM:  Nonfocal.   IMPRESSION:  1.  Chronic changes right breast with progression from a mass of 1.9 cm in      January 2005 to at least 5-6 cm at this time but this appears to be      multilobulated and multifocal though this may not be actual neoplasia it      is worrisome and the area is quite painful to the patient.  2.  Brittle diabetes mellitus.  3.  Chronic glomerulonephritis.  4.  Depression with anxiety.   RECOMMENDATION:  I will wait for the results of the ultrasound tomorrow.  The patient should have excision of this area in spite of the  findings in  order to get a more confirmatory diagnosis and to relieve her pain.  She has  been counseled on a preliminary basis for surgery possibly on Friday and I  will do final counseling tomorrow after the ultrasound is completed.      Vernon Prey. [name], M.D.  Electronically Signed     LCS/MEDQ  D:  09/09/2004  T:  09/09/2004  Job:  [id number]   cc:   Florian Buff, M.D.  [address], [place] [address]  Fax: [place]

## 2010-06-05 NOTE — Op Note (Signed)
NAMELLULIANA, [name]                ACCOUNT NO.:  [id number]   MEDICAL RECORD NO.:  [id number]          PATIENT TYPE:  INP   LOCATION:  A332                          FACILITY:  [facility]   PHYSICIAN:  Vernon Prey. [name], M.D.   DATE OF BIRTH:  21-Jan-1970   DATE OF PROCEDURE:  09/11/2004  DATE OF DISCHARGE:  09/11/2004                                 OPERATIVE REPORT   PREOPERATIVE DIAGNOSIS:  Inflammatory mass, right breast.   POSTOPERATIVE DIAGNOSIS:  Chronic inflammatory mass, right breast.   PROCEDURE:  Right partial mastectomy.   SURGEON:  Dr. [name].   DESCRIPTION:  Under general anesthesia, the right breast and chest were  prepped and draped in a sterile field.  Curvilinear infraumbilical incision  was made and extended through the subcutaneous tissue.  In the superficial  subcutaneous tissue, her hard, fibrotic mass was encountered.  It was  separate from the ductal tissue and seemed to have an infiltrative type  pattern.  It was widely excised with electrocautery.  Once this was done,  pathology was not available, so frozen section was not done.  The breast  tissue was reapproximated using interrupted 2-0 Monocryl.  Subcutaneous  tissue was closed with interrupted 3-0 Monocryl.  Skin was closed with 4-0  Vicryl.  Compression dressing was placed.  The patient tolerated procedure  well.  She was awakened from anesthesia uneventfully, transferred to a bed,  and taken to the postanesthetic care unit for further monitoring.      Vernon Prey. [name], M.D.  Electronically Signed     LCS/MEDQ  D:  09/11/2004  T:  09/12/2004  Job:  [id number]

## 2010-10-02 ENCOUNTER — Emergency Department (HOSPITAL_COMMUNITY)
Admission: EM | Admit: 2010-10-02 | Discharge: 2010-10-02 | Disposition: A | Discharge status: Home / Self Care | Payer: Medicaid Other | Attending: Emergency Medicine | Admitting: Emergency Medicine

## 2010-10-02 ENCOUNTER — Encounter: Payer: Self-pay | Admitting: Emergency Medicine

## 2010-10-02 DIAGNOSIS — Z794 Long term (current) use of insulin: Secondary | ICD-10-CM | POA: Insufficient documentation

## 2010-10-02 DIAGNOSIS — F17200 Nicotine dependence, unspecified, uncomplicated: Secondary | ICD-10-CM | POA: Insufficient documentation

## 2010-10-02 DIAGNOSIS — G43909 Migraine, unspecified, not intractable, without status migrainosus: Secondary | ICD-10-CM | POA: Insufficient documentation

## 2010-10-02 DIAGNOSIS — E876 Hypokalemia: Secondary | ICD-10-CM | POA: Insufficient documentation

## 2010-10-02 DIAGNOSIS — E119 Type 2 diabetes mellitus without complications: Secondary | ICD-10-CM | POA: Insufficient documentation

## 2010-10-02 LAB — BASIC METABOLIC PANEL
BUN: 13 mg/dL (ref 6–23)
CO2: 27 mEq/L (ref 19–32)
Calcium: 8.1 mg/dL — ABNORMAL LOW (ref 8.4–10.5)
Chloride: 105 mEq/L (ref 96–112)
Creatinine, Ser: 0.9 mg/dL (ref 0.50–1.10)
GFR calc Af Amer: >60 mL/min (ref >60)
GFR calc non Af Amer: >60 mL/min (ref >60)
Glucose, Bld: 90 mg/dL (ref 70–99)
Potassium: 2.8 mEq/L — ABNORMAL LOW (ref 3.5–5.1)
Sodium: 138 mEq/L (ref 135–145)

## 2010-10-02 MED ORDER — POTASSIUM CHLORIDE 20 MEQ PO PACK
40.0000 meq | PACK | Freq: Once | ORAL | Status: AC
  Administered 2010-10-02: 40 meq via ORAL
  Filled 2010-10-02: qty 2

## 2010-10-02 MED ORDER — HYDROCODONE-ACETAMINOPHEN 5-325 MG PO TABS
1.0000 | ORAL_TABLET | Freq: Once | ORAL | Status: AC
  Administered 2010-10-02: 1 via ORAL
  Filled 2010-10-02: qty 1

## 2010-10-02 MED ORDER — SODIUM CHLORIDE 0.9 % IV BOLUS (SEPSIS)
1000.0000 mL | Freq: Once | INTRAVENOUS | Status: AC
  Administered 2010-10-02: 1000 mL via INTRAVENOUS

## 2010-10-02 MED ORDER — METOCLOPRAMIDE HCL 5 MG/ML IJ SOLN
10.0000 mg | Freq: Once | INTRAMUSCULAR | Status: AC
  Administered 2010-10-02: 10 mg via INTRAVENOUS
  Filled 2010-10-02: qty 2

## 2010-10-02 NOTE — ED Provider Notes (Signed)
History     CSN: [id number] Arrival date & time: 10/02/2010  2:29 PM   Chief Complaint  Patient presents with  . Facial Swelling     (Include location/radiation/quality/duration/timing/severity/associated sxs/prior treatment) HPI  Patient reports facial swelling at the right side of her face and shortness of breath onset last night . Swelling and dyspnea have resolved without treatment she is presently asymptomatic except for a migraine headache typical of a migraine she's had in the past accompanied by nausea headache is at right temporal area. Treated herself with Excedrin Migraine last night with partial relief Past Medical History  Diagnosis Date  . Diabetes mellitus   . Renal disorder     Glomerulonephritis Past Surgical History  Procedure Date  . Wrist surgery     left  . Tubal ligation     Family History  Problem Relation Age of Onset  . COPD Mother   . Hypertension Mother   . Diabetes Father   . Hypertension Father     History  Substance Use Topics  . Smoking status: Current Everyday Smoker -- 0.5 packs/day for 20 years    Types: Cigarettes  . Smokeless tobacco: Never Used  . Alcohol Use: No    OB History    Grav Para Term Preterm Abortions TAB SAB Ect Mult Living   1 1  1      1       Review of Systems  Constitutional: Negative.   HENT: Positive for facial swelling.   Respiratory: Positive for shortness of breath.   Cardiovascular: Negative.   Gastrointestinal: Negative.   Musculoskeletal: Negative.   Skin: Negative.   Neurological: Positive for headaches.  Hematological: Negative.   Psychiatric/Behavioral: Negative.     Allergies  Review of patient's allergies indicates no known allergies.  Home Medications   Current Outpatient Rx  Name Route Sig Dispense Refill  . ALPRAZOLAM 1 MG PO TABS Oral Take 1 mg by mouth as needed. Anxiety     . ASPIRIN-ACETAMINOPHEN-CAFFEINE 250-250-65 MG PO TABS Oral Take 1 tablet by mouth every 6 (six) hours  as needed. Migraine Headache     . HYDROCODONE-ACETAMINOPHEN 7.5-325 MG PO TABS Oral Take 1 tablet by mouth every 6 (six) hours as needed. Pain     . INSULIN GLARGINE 100 UNIT/ML [place] SOLN Subcutaneous Inject 18 Units into the skin 2 (two) times daily.      . INSULIN REGULAR HUMAN 100 UNIT/ML IJ SOLN Subcutaneous Inject 3-12 Units into the skin 3 (three) times daily before meals. Patient injects according to a sliding scale she has at home.     . SERTRALINE HCL 50 MG PO TABS Oral Take 50 mg by mouth daily.        Physical Exam    BP 145/75  Pulse 98  Temp(Src) 98.7 F (37.1 C) (Oral)  Resp 16  Ht 5\' 1"  (1.549 m)  Wt 125 lb (56.7 kg)  BMI 23.62 kg/m2  SpO2 99%  LMP 09/17/2010  Physical Exam  Nursing note and vitals reviewed. Constitutional: She appears well-developed and well-nourished.  HENT:  Head: Normocephalic and atraumatic.  Right Ear: External ear normal.  Left Ear: External ear normal.       No facial asymmetry or edema  Eyes: Conjunctivae are normal. Pupils are equal, round, and reactive to light.  Neck: Neck supple. No tracheal deviation present. No thyromegaly present.  Cardiovascular: Normal rate and regular rhythm.   No murmur heard. Pulmonary/Chest: Effort normal and breath sounds normal.  Abdominal: Soft. Bowel sounds are normal. She exhibits no distension. There is no tenderness.  Musculoskeletal: Normal range of motion. She exhibits no edema and no tenderness.  Neurological: She is alert. She displays normal reflexes. No cranial nerve deficit. Coordination normal.  Skin: Skin is warm and dry. No rash noted.  Psychiatric: She has a normal mood and affect.   Note: Dentures were removed, there is no swelling of gingiva underneath dentures and no tenderness ED Course  Procedures  Results for orders placed during the hospital encounter of 03/21/10  URINALYSIS, ROUTINE W REFLEX MICROSCOPIC      Component Value Range   Color, Urine YELLOW  YELLOW    Appearance  CLOUDY (*) CLEAR    Specific Gravity, Urine 1.010  1.005 - 1.030    pH 6.0  5.0 - 8.0    Glucose, UA >1000 (*) NEGATIVE (mg/dL)   Hgb urine dipstick TRACE (*) NEGATIVE    Bilirubin Urine NEGATIVE  NEGATIVE    Ketones, ur NEGATIVE  NEGATIVE (mg/dL)   Protein, ur 100 (*) NEGATIVE (mg/dL)   Urobilinogen, UA 0.2  0.0 - 1.0 (mg/dL)   Nitrite NEGATIVE  NEGATIVE    Leukocytes, UA NEGATIVE  NEGATIVE   URINE MICROSCOPIC-ADD ON      Component Value Range   Squamous Epithelial / LPF MANY (*) RARE    WBC, UA 3-6  <3 (WBC/hpf)   RBC / HPF 3-6  <3 (RBC/hpf)   Bacteria, UA FEW (*) RARE   DIFFERENTIAL      Component Value Range   Neutrophils Relative 56  43 - 77 (%)   Neutro Abs 3.3  1.7 - 7.7 (K/uL)   Lymphocytes Relative 34  12 - 46 (%)   Lymphs Abs 2.0  0.7 - 4.0 (K/uL)   Monocytes Relative 8  3 - 12 (%)   Monocytes Absolute 0.5  0.1 - 1.0 (K/uL)   Eosinophils Relative 2  0 - 5 (%)   Eosinophils Absolute 0.1  0.0 - 0.7 (K/uL)   Basophils Relative 1  0 - 1 (%)   Basophils Absolute 0.0  0.0 - 0.1 (K/uL)  CBC      Component Value Range   WBC 6.0  4.0 - 10.5 (K/uL)   RBC 4.56  3.87 - 5.11 (MIL/uL)   Hemoglobin 13.9  12.0 - 15.0 (g/dL)   HCT 42.7  36.0 - 46.0 (%)   MCV 93.6  78.0 - 100.0 (fL)   MCH 30.5  26.0 - 34.0 (pg)   MCHC 32.6  30.0 - 36.0 (g/dL)   RDW 13.7  11.5 - 15.5 (%)   Platelets 386  150 - 400 (K/uL)  BASIC METABOLIC PANEL      Component Value Range   Sodium 125 (*) 135 - 145 (mEq/L)   Potassium 3.9  3.5 - 5.1 (mEq/L)   Chloride 95 (*) 96 - 112 (mEq/L)   CO2 24  19 - 32 (mEq/L)   Glucose, Bld   (*) 70 - 99 (mg/dL)   Value: 675 CRITICAL RESULT CALLED TO, READ BACK BY AND VERIFIED WITH: POINDEXTER,M ON AL:[id number] AT 2135 BY SMITH,N   BUN 8  6 - 23 (mg/dL)   Creatinine, Ser 0.90  0.4 - 1.2 (mg/dL)   Calcium 7.3 (*) 8.4 - 10.5 (mg/dL)   GFR calc non Af Amer >60  >60 (mL/min)   GFR calc Af Amer    >60 (mL/min)   Value: >60  The eGFR has been calculated     using the  MDRD equation.     This calculation has not been     validated in all clinical     situations.     eGFR's persistently     <60 mL/min signify     possible Chronic Kidney Disease.  DRUG SCREEN PANEL, EMERGENCY      Component Value Range   Opiates NONE DETECTED  NONE DETECTED    Cocaine POSITIVE (*) NONE DETECTED    Benzodiazepines NONE DETECTED  NONE DETECTED    Amphetamines NONE DETECTED  NONE DETECTED    Tetrahydrocannabinol NONE DETECTED  NONE DETECTED    Barbiturates    NONE DETECTED    Value: NONE DETECTED            DRUG SCREEN FOR MEDICAL PURPOSES     ONLY.  IF CONFIRMATION IS NEEDED     FOR ANY PURPOSE, NOTIFY LAB     WITHIN 5 DAYS.                LOWEST DETECTABLE LIMITS     FOR URINE DRUG SCREEN     Drug Class       Cutoff (ng/mL)     Amphetamine      1000     Barbiturate      200     Benzodiazepine   [id number]     Tricyclics       [id number]     Opiates          300     Cocaine          300     THC              50  HEPATIC FUNCTION PANEL      Component Value Range   Total Protein 4.3 (*) 6.0 - 8.3 (g/dL)   Albumin 1.4 (*) 3.5 - 5.2 (g/dL)   AST 31  0 - 37 (U/L)   ALT 16  0 - 35 (U/L)   Alkaline Phosphatase 87  39 - 117 (U/L)   Total Bilirubin 0.5  0.3 - 1.2 (mg/dL)   Bilirubin, Direct 0.2  0.0 - 0.3 (mg/dL)   Indirect Bilirubin 0.3  0.3 - 0.9 (mg/dL)  GLUCOSE, CAPILLARY      Component Value Range   Glucose-Capillary 551 (*) 70 - 99 (mg/dL)   Comment 1 Notify RN     Comment 2 Call MD NNP PA CNM    URINALYSIS, ROUTINE W REFLEX MICROSCOPIC      Component Value Range   Color, Urine STRAW (*) YELLOW    Appearance CLEAR  CLEAR    Specific Gravity, Urine 1.010  1.005 - 1.030    pH 6.0  5.0 - 8.0    Glucose, UA >1000 (*) NEGATIVE (mg/dL)   Hgb urine dipstick TRACE (*) NEGATIVE    Bilirubin Urine NEGATIVE  NEGATIVE    Ketones, ur NEGATIVE  NEGATIVE (mg/dL)   Protein, ur 100 (*) NEGATIVE (mg/dL)   Urobilinogen, UA 0.2  0.0 - 1.0 (mg/dL)   Nitrite NEGATIVE  NEGATIVE     Leukocytes, UA NEGATIVE  NEGATIVE   URINE MICROSCOPIC-ADD ON      Component Value Range   Squamous Epithelial / LPF FEW (*) RARE    WBC, UA 3-6  <3 (WBC/hpf)   RBC / HPF 3-6  <3 (RBC/hpf)   Bacteria, UA RARE  RARE   GLUCOSE, CAPILLARY  Component Value Range   Glucose-Capillary 348 (*) 70 - 99 (mg/dL)  MRSA PCR SCREENING      Component Value Range   MRSA by PCR    NEGATIVE    Value: NEGATIVE            The GeneXpert MRSA Assay (FDA     approved for NASAL specimens     only), is one component of a     comprehensive MRSA colonization     surveillance program. It is not     intended to diagnose MRSA     infection nor to guide or     monitor treatment for     MRSA infections.  GLUCOSE, CAPILLARY      Component Value Range   Glucose-Capillary 150 (*) 70 - 99 (mg/dL)  GLUCOSE, CAPILLARY      Component Value Range   Glucose-Capillary 123 (*) 70 - 99 (mg/dL)   Comment 1 Notify RN    GLUCOSE, CAPILLARY      Component Value Range   Glucose-Capillary 121 (*) 70 - 99 (mg/dL)   Comment 1 Notify RN    GLUCOSE, CAPILLARY      Component Value Range   Glucose-Capillary 149 (*) 70 - 99 (mg/dL)   Comment 1 Notify RN    GLUCOSE, CAPILLARY      Component Value Range   Glucose-Capillary 189 (*) 70 - 99 (mg/dL)   Comment 1 Notify RN    GLUCOSE, CAPILLARY      Component Value Range   Glucose-Capillary 189 (*) 70 - 99 (mg/dL)   Comment 1 Notify RN    GLUCOSE, CAPILLARY      Component Value Range   Glucose-Capillary 198 (*) 70 - 99 (mg/dL)   Comment 1 Notify RN    BASIC METABOLIC PANEL      Component Value Range   Sodium 140 DELTA CHECK NOTED  135 - 145 (mEq/L)   Potassium 3.7  3.5 - 5.1 (mEq/L)   Chloride 107 DELTA CHECK NOTED  96 - 112 (mEq/L)   CO2 29  19 - 32 (mEq/L)   Glucose, Bld 198 (*) 70 - 99 (mg/dL)   BUN 11  6 - 23 (mg/dL)   Creatinine, Ser 0.63  0.4 - 1.2 (mg/dL)   Calcium 7.7 (*) 8.4 - 10.5 (mg/dL)   GFR calc non Af Amer >60  >60 (mL/min)   GFR calc Af Amer    >60  (mL/min)   Value: >60            The eGFR has been calculated     using the MDRD equation.     This calculation has not been     validated in all clinical     situations.     eGFR's persistently     <60 mL/min signify     possible Chronic Kidney Disease.  GLUCOSE, CAPILLARY      Component Value Range   Glucose-Capillary 207 (*) 70 - 99 (mg/dL)   Comment 1 Notify RN     Comment 2 Documented in Chart    GLUCOSE, CAPILLARY      Component Value Range   Glucose-Capillary 227 (*) 70 - 99 (mg/dL)   Comment 1 Notify RN     Comment 2 Documented in Chart    BASIC METABOLIC PANEL      Component Value Range   Sodium 137  135 - 145 (mEq/L)   Potassium 3.8  3.5 - 5.1 (mEq/L)   Chloride 105  96 - 112 (mEq/L)   CO2 29  19 - 32 (mEq/L)   Glucose, Bld 288 (*) 70 - 99 (mg/dL)   BUN 12  6 - 23 (mg/dL)   Creatinine, Ser 0.66  0.4 - 1.2 (mg/dL)   Calcium 7.6 (*) 8.4 - 10.5 (mg/dL)   GFR calc non Af Amer >60  >60 (mL/min)   GFR calc Af Amer    >60 (mL/min)   Value: >60            The eGFR has been calculated     using the MDRD equation.     This calculation has not been     validated in all clinical     situations.     eGFR's persistently     <60 mL/min signify     possible Chronic Kidney Disease.  GLUCOSE, CAPILLARY      Component Value Range   Glucose-Capillary 118 (*) 70 - 99 (mg/dL)   Comment 1 Notify RN     Comment 2 Documented in Chart    GLUCOSE, CAPILLARY      Component Value Range   Glucose-Capillary 105 (*) 70 - 99 (mg/dL)   Comment 1 Notify RN     Comment 2 Documented in Chart    GLUCOSE, CAPILLARY      Component Value Range   Glucose-Capillary 244 (*) 70 - 99 (mg/dL)   Comment 1 Documented in Chart     Comment 2 Notify RN    GLUCOSE, CAPILLARY      Component Value Range   Glucose-Capillary 88  70 - 99 (mg/dL)  BASIC METABOLIC PANEL      Component Value Range   Sodium 136  135 - 145 (mEq/L)   Potassium 3.8  3.5 - 5.1 (mEq/L)   Chloride 104  96 - 112 (mEq/L)   CO2  28  19 - 32 (mEq/L)   Glucose, Bld 94  70 - 99 (mg/dL)   BUN 13  6 - 23 (mg/dL)   Creatinine, Ser 0.59  0.4 - 1.2 (mg/dL)   Calcium 7.6 (*) 8.4 - 10.5 (mg/dL)   GFR calc non Af Amer >60  >60 (mL/min)   GFR calc Af Amer    >60 (mL/min)   Value: >60            The eGFR has been calculated     using the MDRD equation.     This calculation has not been     validated in all clinical     situations.     eGFR's persistently     <60 mL/min signify     possible Chronic Kidney Disease.  GLUCOSE, CAPILLARY      Component Value Range   Glucose-Capillary 54 (*) 70 - 99 (mg/dL)  GLUCOSE, CAPILLARY      Component Value Range   Glucose-Capillary 60 (*) 70 - 99 (mg/dL)   Comment 1 Notify RN    GLUCOSE, CAPILLARY      Component Value Range   Glucose-Capillary 127 (*) 70 - 99 (mg/dL)  CBC      Component Value Range   WBC 8.0  4.0 - 10.5 (K/uL)   RBC 4.18  3.87 - 5.11 (MIL/uL)   Hemoglobin 13.2  12.0 - 15.0 (g/dL)   HCT 38.4  36.0 - 46.0 (%)   MCV 91.9  78.0 - 100.0 (fL)   MCH 31.6  26.0 - 34.0 (pg)   MCHC 34.4  30.0 - 36.0 (g/dL)   RDW 13.7  11.5 - 15.5 (%)   Platelets 378  150 - 400 (K/uL)  COMPREHENSIVE METABOLIC PANEL      Component Value Range   Sodium 136  135 - 145 (mEq/L)   Potassium 3.9  3.5 - 5.1 (mEq/L)   Chloride 103  96 - 112 (mEq/L)   CO2 28  19 - 32 (mEq/L)   Glucose, Bld 191 (*) 70 - 99 (mg/dL)   BUN 12  6 - 23 (mg/dL)   Creatinine, Ser 0.52  0.4 - 1.2 (mg/dL)   Calcium 7.7 (*) 8.4 - 10.5 (mg/dL)   Total Protein 4.1 (*) 6.0 - 8.3 (g/dL)   Albumin 1.2 (*) 3.5 - 5.2 (g/dL)   AST 19  0 - 37 (U/L)   ALT 15  0 - 35 (U/L)   Alkaline Phosphatase 56  39 - 117 (U/L)   Total Bilirubin 0.4  0.3 - 1.2 (mg/dL)   GFR calc non Af Amer >60  >60 (mL/min)   GFR calc Af Amer    >60 (mL/min)   Value: >60            The eGFR has been calculated     using the MDRD equation.     This calculation has not been     validated in all clinical     situations.     eGFR's persistently      <60 mL/min signify     possible Chronic Kidney Disease.  GLUCOSE, CAPILLARY      Component Value Range   Glucose-Capillary 185 (*) 70 - 99 (mg/dL)   Comment 1 Notify RN    GLUCOSE, CAPILLARY      Component Value Range   Glucose-Capillary 274 (*) 70 - 99 (mg/dL)   Comment 1 Documented in Chart     Comment 2 Notify RN    BASIC METABOLIC PANEL      Component Value Range   Sodium 135  135 - 145 (mEq/L)   Potassium 4.2  3.5 - 5.1 (mEq/L)   Chloride 101  96 - 112 (mEq/L)   CO2 28  19 - 32 (mEq/L)   Glucose, Bld 284 (*) 70 - 99 (mg/dL)   BUN 12  6 - 23 (mg/dL)   Creatinine, Ser 0.57  0.4 - 1.2 (mg/dL)   Calcium 7.5 (*) 8.4 - 10.5 (mg/dL)   GFR calc non Af Amer >60  >60 (mL/min)   GFR calc Af Amer    >60 (mL/min)   Value: >60            The eGFR has been calculated     using the MDRD equation.     This calculation has not been     validated in all clinical     situations.     eGFR's persistently     <60 mL/min signify     possible Chronic Kidney Disease.  GLUCOSE, CAPILLARY      Component Value Range   Glucose-Capillary 79  70 - 99 (mg/dL)  GLUCOSE, CAPILLARY      Component Value Range   Glucose-Capillary 131 (*) 70 - 99 (mg/dL)  GLUCOSE, CAPILLARY      Component Value Range   Glucose-Capillary 57 (*) 70 - 99 (mg/dL)   Comment 1 Documented in Chart     Comment 2 Notify RN    TSH      Component Value Range   TSH 3.185  0.350 - 4.500 (uIU/mL)  T4, FREE      Component Value  Range   Free T4 0.66 (*) 0.80 - 1.80 (ng/dL)  BASIC METABOLIC PANEL      Component Value Range   Sodium 136  135 - 145 (mEq/L)   Potassium 4.1  3.5 - 5.1 (mEq/L)   Chloride 102  96 - 112 (mEq/L)   CO2 29  19 - 32 (mEq/L)   Glucose, Bld 90  70 - 99 (mg/dL)   BUN 13  6 - 23 (mg/dL)   Creatinine, Ser 0.68  0.4 - 1.2 (mg/dL)   Calcium 7.7 (*) 8.4 - 10.5 (mg/dL)   GFR calc non Af Amer >60  >60 (mL/min)   GFR calc Af Amer    >60 (mL/min)   Value: >60            The eGFR has been calculated     using  the MDRD equation.     This calculation has not been     validated in all clinical     situations.     eGFR's persistently     <60 mL/min signify     possible Chronic Kidney Disease.  GLUCOSE, CAPILLARY      Component Value Range   Glucose-Capillary 101 (*) 70 - 99 (mg/dL)   Comment 1 Documented in Chart     Comment 2 Notify RN    HEMOGLOBIN A1C      Component Value Range   Hemoglobin A1C   (*) <5.7 (%)   Value: 11.0     (NOTE)                                                                       According to the ADA Clinical Practice Recommendations for 2011, when HbA1c is used as a screening test:   >=6.5%   Diagnostic of Diabetes Mellitus           (if abnormal result      is confirmed)  5.7-6.4%   Increased risk of developing Diabetes Mellitus  References:Diagnosis and Classification of Diabetes Mellitus,Diabetes [id number] 1):S62-S69 and Standards of Medical Care in         Diabetes - 2011,Diabetes Care,2011,34      (Suppl 1):S11-S61.   Mean Plasma Glucose 269 (*) <117 (mg/dL)  URINE CULTURE      Component Value Range   Specimen Description URINE, CLEAN CATCH     Special Requests NONE     Setup Time [id number]     Colony Count 50,000 COLONIES/ML     Culture       Value: Multiple bacterial morphotypes present, none predominant. Suggest appropriate recollection if clinically indicated.   Report Status 03/24/2010 FINAL    GLUCOSE, CAPILLARY      Component Value Range   Glucose-Capillary 382 (*) 70 - 99 (mg/dL)   Comment 1 Documented in Chart     Comment 2 Notify RN    BASIC METABOLIC PANEL      Component Value Range   Sodium 134 (*) 135 - 145 (mEq/L)   Potassium 4.8  3.5 - 5.1 (mEq/L)   Chloride 101  96 - 112 (mEq/L)   CO2 29  19 - 32 (mEq/L)   Glucose, Bld 404 (*) 70 - 99 (mg/dL)   BUN 10  6 -  23 (mg/dL)   Creatinine, Ser 0.66  0.4 - 1.2 (mg/dL)   Calcium 7.8 (*) 8.4 - 10.5 (mg/dL)   GFR calc non Af Amer >60  >60 (mL/min)   GFR calc Af Amer    >60 (mL/min)    Value: >60            The eGFR has been calculated     using the MDRD equation.     This calculation has not been     validated in all clinical     situations.     eGFR's persistently     <60 mL/min signify     possible Chronic Kidney Disease.  GLUCOSE, CAPILLARY      Component Value Range   Glucose-Capillary 213 (*) 70 - 99 (mg/dL)   Comment 1 Documented in Chart     Comment 2 Notify RN    GLUCOSE, CAPILLARY      Component Value Range   Glucose-Capillary 94  70 - 99 (mg/dL)   Comment 1 Documented in Chart    GLUCOSE, CAPILLARY      Component Value Range   Glucose-Capillary 210 (*) 70 - 99 (mg/dL)   Comment 1 Notify RN     Comment 2 Documented in Chart    GLUCOSE, CAPILLARY      Component Value Range   Glucose-Capillary 287 (*) 70 - 99 (mg/dL)   Comment 1 Notify RN     Comment 2 Documented in Chart    BASIC METABOLIC PANEL      Component Value Range   Sodium 130 (*) 135 - 145 (mEq/L)   Potassium 4.6  3.5 - 5.1 (mEq/L)   Chloride 99  96 - 112 (mEq/L)   CO2 29  19 - 32 (mEq/L)   Glucose, Bld 352 (*) 70 - 99 (mg/dL)   BUN 11  6 - 23 (mg/dL)   Creatinine, Ser 0.80  0.4 - 1.2 (mg/dL)   Calcium 7.5 (*) 8.4 - 10.5 (mg/dL)   GFR calc non Af Amer >60  >60 (mL/min)   GFR calc Af Amer    >60 (mL/min)   Value: >60            The eGFR has been calculated     using the MDRD equation.     This calculation has not been     validated in all clinical     situations.     eGFR's persistently     <60 mL/min signify     possible Chronic Kidney Disease.  GLUCOSE, CAPILLARY      Component Value Range   Glucose-Capillary 432 (*) 70 - 99 (mg/dL)   Comment 1 Notify RN     Comment 2 Documented in Chart     No results found. 5:20 PM nausea has resolved headache is improved slightly after treatment with intravenous Reglan patient is alert Glasgow Coma Score 15 moves all extremities Vicodin one tablet by mouth ordered as well as potassium chloride 40 mEq by mouth to treat hypokalemia No  diagnosis found.   MDM I see no objective evidence of facial swelling no signs of infection or stroke. Diagnosis #1 migraine headache #2 dyspnea-resolved #3 hypokalemia. Plan followup Dr. Luan Pulling next week discharge stable       Orlie Dakin, MD 10/02/10 1723

## 2010-10-02 NOTE — ED Notes (Signed)
Pt with R sided facial swelling, progressively getting worse. C/O some difficulty breathing. No visible signs of insect bite. No redness noted.

## 2010-10-02 NOTE — ED Notes (Signed)
Alert, in no distress; denies nausea and reports decreased HA and facial pain; left in c/o fiance for transport home.

## 2010-10-08 ENCOUNTER — Encounter (HOSPITAL_COMMUNITY): Payer: Self-pay | Admitting: *Deleted

## 2010-10-08 ENCOUNTER — Emergency Department (HOSPITAL_COMMUNITY)
Admission: EM | Admit: 2010-10-08 | Discharge: 2010-10-09 | Disposition: A | Discharge status: Home / Self Care | Payer: Medicaid Other | Attending: Emergency Medicine | Admitting: Emergency Medicine

## 2010-10-08 DIAGNOSIS — E119 Type 2 diabetes mellitus without complications: Secondary | ICD-10-CM | POA: Insufficient documentation

## 2010-10-08 DIAGNOSIS — R252 Cramp and spasm: Secondary | ICD-10-CM | POA: Insufficient documentation

## 2010-10-08 DIAGNOSIS — F17200 Nicotine dependence, unspecified, uncomplicated: Secondary | ICD-10-CM | POA: Insufficient documentation

## 2010-10-08 DIAGNOSIS — E876 Hypokalemia: Secondary | ICD-10-CM

## 2010-10-08 DIAGNOSIS — N289 Disorder of kidney and ureter, unspecified: Secondary | ICD-10-CM | POA: Insufficient documentation

## 2010-10-08 NOTE — ED Notes (Signed)
Pt brought in by [facility] for c/o cramping all over; pt was seen here x 1 week ago for low potassium and was told to follow up with her PCP and did not follow up

## 2010-10-09 LAB — BASIC METABOLIC PANEL
BUN: 15 mg/dL (ref 6–23)
CO2: 27 mEq/L (ref 19–32)
Calcium: 7.7 mg/dL — ABNORMAL LOW (ref 8.4–10.5)
Chloride: 100 mEq/L (ref 96–112)
Creatinine, Ser: 1.33 mg/dL — ABNORMAL HIGH (ref 0.50–1.10)
GFR calc Af Amer: 53 mL/min — ABNORMAL LOW (ref >60)
GFR calc non Af Amer: 44 mL/min — ABNORMAL LOW (ref >60)
Glucose, Bld: 96 mg/dL (ref 70–99)
Potassium: 3 mEq/L — ABNORMAL LOW (ref 3.5–5.1)
Sodium: 133 mEq/L — ABNORMAL LOW (ref 135–145)

## 2010-10-09 MED ORDER — HYDROMORPHONE HCL 1 MG/ML IJ SOLN
1.0000 mg | Freq: Once | INTRAMUSCULAR | Status: AC
  Administered 2010-10-09: 1 mg via INTRAVENOUS
  Filled 2010-10-09: qty 1

## 2010-10-09 MED ORDER — POTASSIUM CHLORIDE CRYS ER 20 MEQ PO TBCR: 10.0000 meq | EXTENDED_RELEASE_TABLET | Freq: Every day | ORAL | Status: DC

## 2010-10-09 MED ORDER — SODIUM CHLORIDE 0.9 % IV SOLN
Freq: Once | INTRAVENOUS | Status: AC
  Administered 2010-10-09: 01:00:00 via INTRAVENOUS

## 2010-10-09 MED ORDER — POTASSIUM CHLORIDE CRYS ER 20 MEQ PO TBCR
60.0000 meq | EXTENDED_RELEASE_TABLET | Freq: Once | ORAL | Status: AC
  Administered 2010-10-09: 60 meq via ORAL
  Filled 2010-10-09: qty 3

## 2010-10-09 MED ORDER — HYDROMORPHONE HCL 1 MG/ML IJ SOLN
1.0000 mg | Freq: Once | INTRAMUSCULAR | Status: AC
  Administered 2010-10-09: 1 mg via INTRAVENOUS
  Filled 2010-10-09 (×2): qty 1

## 2010-10-09 MED ORDER — KETOROLAC TROMETHAMINE 30 MG/ML IJ SOLN
30.0000 mg | Freq: Once | INTRAMUSCULAR | Status: AC
  Administered 2010-10-09: 30 mg via INTRAVENOUS
  Filled 2010-10-09 (×2): qty 1

## 2010-10-09 MED ORDER — ONDANSETRON HCL 4 MG PO TABS
4.0000 mg | ORAL_TABLET | Freq: Once | ORAL | Status: AC
  Administered 2010-10-09: 4 mg via ORAL
  Filled 2010-10-09 (×2): qty 1

## 2010-10-09 NOTE — ED Notes (Signed)
Patient states she is feeling ruff. Patient has a microwaved meal being prepared for her. RN Brandy aware.

## 2010-10-09 NOTE — ED Provider Notes (Signed)
History     CSN: [id number] Arrival date & time: 10/08/2010 10:59 PM  Chief Complaint  Patient presents with  . cramping all over body     HPI  (Consider location/radiation/quality/duration/timing/severity/associated sxs/prior treatment)  HPI Comments: Seen 0039. PCP is Dr. Luan Pulling. Patient with recent visit to ER with hypokalemia (2.8). Tonight developed body wide muscle cramping associated with weakness, nausea, vomiting. Denies feve, chills, chest pain. Felt short of breath due to muscle cramping.  Patient is a 40 y.o. female presenting with extremity weakness. The history is provided by the patient.  Extremity Weakness This is a new (Patient developed bodywide muscle cramping associated with nausea and vomiting. Was seen 10/02/10 in ER and dx with low K) problem. The current episode started 3 to 5 hours ago. The problem occurs constantly. The problem has not changed since onset.Associated symptoms include abdominal pain, headaches and shortness of breath. Pertinent negatives include no chest pain. The symptoms are aggravated by nothing. The symptoms are relieved by nothing. She has tried nothing for the symptoms.    Past Medical History  Diagnosis Date  . Diabetes mellitus   . Renal disorder     Past Surgical History  Procedure Date  . Wrist surgery     left  . Tubal ligation     Family History  Problem Relation Age of Onset  . COPD Mother   . Hypertension Mother   . Diabetes Father   . Hypertension Father     History  Substance Use Topics  . Smoking status: Current Everyday Smoker -- 0.5 packs/day for 20 years    Types: Cigarettes  . Smokeless tobacco: Never Used  . Alcohol Use: No    OB History    Grav Para Term Preterm Abortions TAB SAB Ect Mult Living   1 1  1      1       Review of Systems  Review of Systems  Respiratory: Positive for shortness of breath.   Cardiovascular: Negative for chest pain.  Gastrointestinal: Positive for abdominal pain.    Musculoskeletal: Positive for extremity weakness.  Neurological: Positive for headaches.  All other systems reviewed and are negative.    Allergies  Review of patient's allergies indicates no known allergies.  Home Medications   Current Outpatient Rx  Name Route Sig Dispense Refill  . ALPRAZOLAM 1 MG PO TABS Oral Take 1 mg by mouth as needed. Anxiety     . ASPIRIN-ACETAMINOPHEN-CAFFEINE 250-250-65 MG PO TABS Oral Take 1 tablet by mouth every 6 (six) hours as needed. Migraine Headache     . HYDROCODONE-ACETAMINOPHEN 7.5-325 MG PO TABS Oral Take 1 tablet by mouth every 6 (six) hours as needed. Pain     . INSULIN GLARGINE 100 UNIT/ML [place] SOLN Subcutaneous Inject 18 Units into the skin 2 (two) times daily.      . INSULIN REGULAR HUMAN 100 UNIT/ML IJ SOLN Subcutaneous Inject 3-12 Units into the skin 3 (three) times daily before meals. Patient injects according to a sliding scale she has at home.     . SERTRALINE HCL 50 MG PO TABS Oral Take 50 mg by mouth daily.        Physical Exam    BP 141/80  Pulse 106  Temp(Src) 98.3 F (36.8 C) (Oral)  Resp 16  SpO2 99%  LMP 09/17/2010  Physical Exam  Nursing note and vitals reviewed. Constitutional: She is oriented to person, place, and time. She appears well-developed and well-nourished.  HENT:  Head: Normocephalic  and atraumatic.  Eyes: EOM are normal.  Neck: Normal range of motion. Neck supple.  Cardiovascular: Normal rate, normal heart sounds and intact distal pulses.   Pulmonary/Chest: Effort normal and breath sounds normal.  Abdominal: Soft.  Musculoskeletal: Normal range of motion. She exhibits no edema and no tenderness.  Neurological: She is alert and oriented to person, place, and time. She has normal reflexes.  Skin: Skin is warm and dry.    ED Course  Results for orders placed during the hospital encounter of [id number]  BASIC METABOLIC PANEL      Component Value Range   Sodium 133 (*) 135 - 145 (mEq/L)   Potassium 3.0  (*) 3.5 - 5.1 (mEq/L)   Chloride 100  96 - 112 (mEq/L)   CO2 27  19 - 32 (mEq/L)   Glucose, Bld 96  70 - 99 (mg/dL)   BUN 15  6 - 23 (mg/dL)   Creatinine, Ser 1.33 (*) 0.50 - 1.10 (mg/dL)   Calcium 7.7 (*) 8.4 - 10.5 (mg/dL)   GFR calc non Af Amer 44 (*) >60 (mL/min)   GFR calc Af Amer 53 (*) >60 (mL/min)    CRITICAL CARE Performed by: Prentiss Bells. Authorized by: Prentiss Bells Total critical care time: 40 minutes Critical care was necessary to treat or prevent imminent or life-threatening deterioration of the following conditions: metabolic crisis. Critical care was time spent personally by me on the following activities: development of treatment plan with patient or surrogate, evaluation of patient's response to treatment, examination of patient, obtaining history from patient or surrogate, ordering and performing treatments and interventions, ordering and review of laboratory studies, re-evaluation of patient's condition and review of old charts.   (including critical care time)  Patient with recent evaluation in the ER for hypokalemia who comes in with generalized muscle cramping. Potassium 3.0 tonight. Repleted with PO potassium. IVF administered.  Analgesics relieved cramping. Pt feels improved after observation and/or treatment in ED.Pt stable in ED with no significant deterioration in condition. MDM Reviewed: nursing note, vitals and previous chart Interpretation: labs         Gypsy Balsam. Olin Hauser, MD 10/09/10 [phone]

## 2010-10-14 LAB — DIFFERENTIAL
Basophils Absolute: 0
Basophils Relative: 0
Eosinophils Absolute: 0.3
Eosinophils Relative: 3
Lymphocytes Relative: 16
Lymphs Abs: 1.4
Monocytes Absolute: 0.6
Monocytes Relative: 7
Neutro Abs: 6.5
Neutrophils Relative %: 74

## 2010-10-14 LAB — COMPREHENSIVE METABOLIC PANEL
ALT: 12
AST: 20
Albumin: 3.7
Alkaline Phosphatase: 85
BUN: 4 — ABNORMAL LOW
CO2: 30
Calcium: 9.8
Chloride: 97
Creatinine, Ser: 0.51
GFR calc Af Amer: >60
GFR calc non Af Amer: >60
Glucose, Bld: 63 — ABNORMAL LOW
Potassium: 3.9
Sodium: 136
Total Bilirubin: 0.5
Total Protein: 6.9

## 2010-10-14 LAB — CBC
HCT: 40.3
Hemoglobin: 14.5
MCHC: 35.9
MCV: 91.8
Platelets: 350
RBC: 4.39
RDW: 13.5
WBC: 8.7

## 2010-10-14 LAB — URINALYSIS, ROUTINE W REFLEX MICROSCOPIC
Bilirubin Urine: NEGATIVE
Glucose, UA: NEGATIVE
Hgb urine dipstick: NEGATIVE
Ketones, ur: NEGATIVE
Leukocytes, UA: NEGATIVE
Nitrite: NEGATIVE
Protein, ur: 100 — AB
Specific Gravity, Urine: 1.02
Urobilinogen, UA: 0.2
pH: 7

## 2010-10-14 LAB — URINE MICROSCOPIC-ADD ON

## 2010-10-15 LAB — BASIC METABOLIC PANEL
BUN: 7
CO2: 25
Calcium: 9.4
Chloride: 106
Creatinine, Ser: 0.42
GFR calc Af Amer: >60
GFR calc non Af Amer: >60
Glucose, Bld: 50 — ABNORMAL LOW
Potassium: 4.2
Sodium: 139

## 2010-10-16 ENCOUNTER — Emergency Department (HOSPITAL_COMMUNITY): Payer: Medicaid Other

## 2010-10-16 ENCOUNTER — Emergency Department (HOSPITAL_COMMUNITY)
Admission: EM | Admit: 2010-10-16 | Discharge: 2010-10-16 | Disposition: A | Discharge status: Home / Self Care | Payer: Medicaid Other | Attending: Emergency Medicine | Admitting: Emergency Medicine

## 2010-10-16 ENCOUNTER — Encounter (HOSPITAL_COMMUNITY): Payer: Self-pay | Admitting: *Deleted

## 2010-10-16 DIAGNOSIS — R21 Rash and other nonspecific skin eruption: Secondary | ICD-10-CM | POA: Insufficient documentation

## 2010-10-16 DIAGNOSIS — IMO0001 Reserved for inherently not codable concepts without codable children: Secondary | ICD-10-CM | POA: Insufficient documentation

## 2010-10-16 DIAGNOSIS — M791 Myalgia, unspecified site: Secondary | ICD-10-CM

## 2010-10-16 DIAGNOSIS — M7989 Other specified soft tissue disorders: Secondary | ICD-10-CM | POA: Insufficient documentation

## 2010-10-16 DIAGNOSIS — F17200 Nicotine dependence, unspecified, uncomplicated: Secondary | ICD-10-CM | POA: Insufficient documentation

## 2010-10-16 DIAGNOSIS — E119 Type 2 diabetes mellitus without complications: Secondary | ICD-10-CM | POA: Insufficient documentation

## 2010-10-16 DIAGNOSIS — M25569 Pain in unspecified knee: Secondary | ICD-10-CM | POA: Insufficient documentation

## 2010-10-16 LAB — POCT I-STAT, CHEM 8
BUN: 18 mg/dL (ref 6–23)
Calcium, Ion: 1.08 mmol/L — ABNORMAL LOW (ref 1.12–1.32)
Chloride: 103 mEq/L (ref 96–112)
Creatinine, Ser: 1.1 mg/dL (ref 0.50–1.10)
Glucose, Bld: 190 mg/dL — ABNORMAL HIGH (ref 70–99)
HCT: 40 % (ref 36.0–46.0)
Hemoglobin: 13.6 g/dL (ref 12.0–15.0)
Potassium: 3.7 mEq/L (ref 3.5–5.1)
Sodium: 135 mEq/L (ref 135–145)
TCO2: 25 mmol/L (ref 0–100)

## 2010-10-16 LAB — PROTIME-INR
INR: 0.88 (ref 0.00–1.49)
Prothrombin Time: 12.1 s (ref 11.6–15.2)

## 2010-10-16 LAB — APTT: aPTT: 26 s (ref 24–37)

## 2010-10-16 MED ORDER — IBUPROFEN 400 MG PO TABS
600.0000 mg | ORAL_TABLET | Freq: Once | ORAL | Status: AC
  Administered 2010-10-16: 600 mg via ORAL
  Filled 2010-10-16: qty 1

## 2010-10-16 MED ORDER — IBUPROFEN 600 MG PO TABS: 600.0000 mg | ORAL_TABLET | Freq: Three times a day (TID) | ORAL | Status: AC | PRN

## 2010-10-16 MED ORDER — OXYCODONE-ACETAMINOPHEN 5-325 MG PO TABS: 2.0000 | ORAL_TABLET | ORAL | Status: AC | PRN

## 2010-10-16 MED ORDER — OXYCODONE-ACETAMINOPHEN 5-325 MG PO TABS
2.0000 | ORAL_TABLET | Freq: Once | ORAL | Status: AC
  Administered 2010-10-16: 2 via ORAL
  Filled 2010-10-16: qty 2

## 2010-10-16 NOTE — ED Notes (Signed)
Pt c/o severe right leg pain that began this am; pt has some purple spots to right upper thigh, just above knee

## 2010-10-16 NOTE — ED Notes (Signed)
Patient with no complaints at this time. Respirations even and unlabored. Skin warm/dry. Discharge instructions reviewed with patient at this time. Patient given opportunity to voice concerns/ask questions. Patient discharged at this time and left Emergency Department with steady gait. Patient to waiting room to await ride home.

## 2010-10-16 NOTE — ED Provider Notes (Addendum)
History     CSN: [id number] Arrival date & time: 10/16/2010 12:07 PM  Chief Complaint  Patient presents with  . Claudication    right leg    (Consider location/radiation/quality/duration/timing/severity/associated sxs/prior treatment) HPI Comments: The patient is a 40 year old female who presents for evaluation of right upper leg pain that she reports began yesterday evening. She describes the pain as being localized to the medial aspect and anterior aspect of the right upper leg just proximal to the knee with some pain in the knee itself. She denies any recent injury to the leg or the knee. She denies any previous history of deep vein thrombosis, recent immobilization, recent surgery, or recent prolonged travel to put her at risk for DVT. She does also report some burning pain over the anterior right upper leg just proximal to the knee where she has a few erythematous macules that are nonraised, non-blistering without purpura, and appear possibly compatible with contact dermatitis and/or insect bites. There is no sign of cellulitis here. There is tenderness to palpation of the medial right thigh as well as mild tenderness to palpation of the knee without any joint effusion. There is no tenderness to the right calf, and distal pulses at the dorsalis pedis and posterior tibialis arteries are full and palpable and equal to the left lower extremity. There is no significant lower extremity edema noted.  Patient is a 40 y.o. female presenting with leg pain.  Leg Pain  The incident occurred yesterday. Incident location: No reported injury. There was no injury mechanism. The quality of the pain is described as aching and burning. The pain is moderate. The pain has been constant since onset. Pertinent negatives include no numbness, no inability to bear weight, no loss of motion, no muscle weakness, no loss of sensation and no tingling. She reports no foreign bodies present. The symptoms are aggravated by  bearing weight and palpation. She has tried nothing for the symptoms.    Past Medical History  Diagnosis Date  . Diabetes mellitus   . Renal disorder     Past Surgical History  Procedure Date  . Wrist surgery     left  . Tubal ligation     Family History  Problem Relation Age of Onset  . COPD Mother   . Hypertension Mother   . Diabetes Father   . Hypertension Father     History  Substance Use Topics  . Smoking status: Current Everyday Smoker -- 0.5 packs/day for 20 years    Types: Cigarettes  . Smokeless tobacco: Never Used  . Alcohol Use: No    OB History    Grav Para Term Preterm Abortions TAB SAB Ect Mult Living   1 1  1      1       Review of Systems  Constitutional: Negative for fever, chills and fatigue.  HENT: Negative for sore throat and facial swelling.   Eyes: Negative.   Respiratory: Negative.  Negative for chest tightness and shortness of breath.   Cardiovascular: Negative.  Negative for chest pain and leg swelling.  Gastrointestinal: Negative.   Musculoskeletal: Positive for myalgias. Negative for joint swelling.  Skin: Positive for rash.  Neurological: Negative for tingling and numbness.  Hematological: Negative for adenopathy. Does not bruise/bleed easily.  Psychiatric/Behavioral: Negative.     Allergies  Aspirin  Home Medications   Current Outpatient Rx  Name Route Sig Dispense Refill  . ALPRAZOLAM 1 MG PO TABS Oral Take 1 mg by mouth daily as  needed. Anxiety    . ASPIRIN-ACETAMINOPHEN-CAFFEINE 250-250-65 MG PO TABS Oral Take 1 tablet by mouth every 6 (six) hours as needed. Migraine Headache     . HYDROCODONE-ACETAMINOPHEN 7.5-325 MG PO TABS Oral Take 1 tablet by mouth every 6 (six) hours as needed. Pain     . INSULIN GLARGINE 100 UNIT/ML [place] SOLN Subcutaneous Inject 18 Units into the skin 2 (two) times daily.      . INSULIN REGULAR HUMAN 100 UNIT/ML IJ SOLN Subcutaneous Inject 3-12 Units into the skin 3 (three) times daily before meals.  Patient injects according to a sliding scale she has at home.     Marland Kitchen POTASSIUM CHLORIDE CRYS CR 20 MEQ PO TBCR Oral Take 0.5 tablets (10 mEq total) by mouth daily. 7 tablet 0  . SERTRALINE HCL 50 MG PO TABS Oral Take 50 mg by mouth daily.        BP 130/77  Pulse 90  Temp(Src) 98.6 F (37 C) (Oral)  Resp 20  Ht 5\' 1"  (1.549 m)  Wt 125 lb (56.7 kg)  BMI 23.62 kg/m2  SpO2 96%  LMP 09/17/2010  Physical Exam  Nursing note and vitals reviewed. Constitutional: She is oriented to person, place, and time. She appears well-developed and well-nourished. She appears distressed.  HENT:  Head: Normocephalic and atraumatic.  Mouth/Throat: Oropharynx is clear and moist.  Eyes: Conjunctivae and EOM are normal. Pupils are equal, round, and reactive to light.  Neck: Normal range of motion. Neck supple.  Cardiovascular: Normal rate, regular rhythm, normal heart sounds and intact distal pulses.  Exam reveals no gallop and no friction rub.   No murmur heard. Pulmonary/Chest: Effort normal and breath sounds normal. No respiratory distress. She has no wheezes. She has no rales.  Abdominal: Soft. Bowel sounds are normal. She exhibits no distension. There is no tenderness. There is no rebound and no guarding.  Musculoskeletal: Normal range of motion. She exhibits tenderness. She exhibits no edema.       Right upper leg: She exhibits tenderness. She exhibits no bony tenderness, no swelling, no edema, no deformity and no laceration.       Left upper leg: Normal.       Legs: Neurological: She is alert and oriented to person, place, and time. No cranial nerve deficit. She exhibits normal muscle tone. Coordination normal.  Skin: Skin is warm and dry. Rash noted. No bruising, no burn, no ecchymosis, no petechiae and no purpura noted. Rash is macular. Rash is not papular, not nodular and not vesicular. There is erythema. No pallor.     Psychiatric: She has a normal mood and affect. Her behavior is normal. Judgment  and thought content normal.    ED Course  Procedures (including critical care time)  Labs Reviewed  POCT I-STAT, CHEM 8 - Abnormal; Notable for the following:    Glucose, Bld 190 (*)    Calcium, Ion 1.08 (*)    All other components within normal limits  PROTIME-INR  APTT  I-STAT, CHEM 8   US Venous Img Lower Unilateral Right  10/16/2010  *RADIOLOGY REPORT*  Clinical Data: Swelling in the right lower extremity  RIGHT LOWER EXTREMITY VENOUS DUPLEX ULTRASOUND  Technique:  Gray-scale sonography with graded compression, as well as color Doppler and duplex ultrasound were performed to evaluate the deep venous system of the lower extremity from the level of the common femoral vein through the popliteal and proximal calf veins. Spectral Doppler was utilized to evaluate flow at rest and with  distal augmentation maneuvers.  Comparison:  None.  Findings:  Normal compressibility of the common femoral, superficial femoral, and popliteal veins is demonstrated, as well as the visualized proximal calf veins.  No filling defects to suggest DVT on grayscale or color Doppler imaging.  Doppler waveforms show normal direction of venous flow, normal respiratory phasicity and response to augmentation.  IMPRESSION: No evidence of lower extremity deep vein thrombosis.  Original Report Authenticated By: Suzy Bouchard, M.D.   Dg Knee Complete 4 Views Right  10/16/2010  *RADIOLOGY REPORT*  Clinical Data: Spontaneous right knee pain and swelling, no trauma  RIGHT KNEE - COMPLETE 4+ VIEW  Comparison: None.  Findings: There is diffuse soft tissue edema noted.  No fracture or dislocation.  No joint effusion.  No radiopaque foreign body.  IMPRESSION: Diffuse soft tissue edema about the knee without underlying osseous abnormality.  Original Report Authenticated By: Arline Asp, M.D.     No diagnosis found.    MDM  Deep vein thrombosis, muscular strain, myalgia , arthritis, contact dermatitis, insect bites. The  patient's distal circulation is very well and intact to examination and I am not concerned for acute arterial occlusion or vascular insufficiency to the right lower extremity. Deep vein thrombosis is the most concerning potential etiology of her pain that is suggested by her examination and we will evaluate this with a venous ultrasound to exclude this possibility. Otherwise an x-ray of the knee will be performed to assure no sign of arthritis or bony injury.   The patient has no apparent deep venous thrombosis, in no acute osseous abnormality of the knee. At this time I can conclude that the patient is having muscular pain of the right lower extremity. Without any condition threatening her lower extremity or her overall safety and health. I will treat her symptomatically and discharged her from the emergency department for outpatient followup as needed.     Charlena Cross, MD 10/16/10 1431  Charlena Cross, MD 10/16/10 [phone]

## 2010-10-19 LAB — GLUCOSE, CAPILLARY
Glucose-Capillary: 275 — ABNORMAL HIGH
Glucose-Capillary: 308 — ABNORMAL HIGH
Glucose-Capillary: 328 — ABNORMAL HIGH
Glucose-Capillary: 83

## 2010-10-19 LAB — URINE MICROSCOPIC-ADD ON

## 2010-10-19 LAB — COMPREHENSIVE METABOLIC PANEL
ALT: 11
AST: 17
Albumin: 4.1
Alkaline Phosphatase: 57
BUN: 16
CO2: 27
Calcium: 10.2
Chloride: 98
Creatinine, Ser: 0.65
GFR calc Af Amer: >60
GFR calc non Af Amer: >60
Glucose, Bld: 117 — ABNORMAL HIGH
Potassium: 3.3 — ABNORMAL LOW
Sodium: 137
Total Bilirubin: 0.6
Total Protein: 7.5

## 2010-10-19 LAB — BASIC METABOLIC PANEL
BUN: 11
BUN: 9
CO2: 26
CO2: 28
Calcium: 9.1
Calcium: 9.3
Chloride: 102
Chloride: 95 — ABNORMAL LOW
Creatinine, Ser: 0.68
Creatinine, Ser: 0.83
GFR calc Af Amer: >60
GFR calc Af Amer: >60
GFR calc non Af Amer: >60
GFR calc non Af Amer: >60
Glucose, Bld: 174 — ABNORMAL HIGH
Glucose, Bld: 441 — ABNORMAL HIGH
Potassium: 4
Potassium: 6.5
Sodium: 131 — ABNORMAL LOW
Sodium: 137

## 2010-10-19 LAB — URINALYSIS, ROUTINE W REFLEX MICROSCOPIC
Bilirubin Urine: NEGATIVE
Bilirubin Urine: NEGATIVE
Glucose, UA: NEGATIVE
Glucose, UA: >1000 — AB
Hgb urine dipstick: NEGATIVE
Hgb urine dipstick: NEGATIVE
Ketones, ur: NEGATIVE
Ketones, ur: 40 — AB
Leukocytes, UA: NEGATIVE
Leukocytes, UA: NEGATIVE
Nitrite: NEGATIVE
Nitrite: NEGATIVE
Protein, ur: 30 — AB
Specific Gravity, Urine: 1.01
Specific Gravity, Urine: <1.005 — ABNORMAL LOW
Urobilinogen, UA: 0.2
Urobilinogen, UA: 0.2
pH: 6
pH: 6

## 2010-10-19 LAB — DIFFERENTIAL
Basophils Absolute: 0
Basophils Absolute: 0
Basophils Relative: 1
Basophils Relative: 0
Eosinophils Absolute: 0.3
Eosinophils Absolute: 0
Eosinophils Relative: 4
Eosinophils Relative: 0
Lymphocytes Relative: 42
Lymphocytes Relative: 15
Lymphs Abs: 3.2
Lymphs Abs: 1.9
Monocytes Absolute: 0.8
Monocytes Absolute: 0.7
Monocytes Relative: 10
Monocytes Relative: 5
Neutro Abs: 3.4
Neutro Abs: 10.4 — ABNORMAL HIGH
Neutrophils Relative %: 44
Neutrophils Relative %: 80 — ABNORMAL HIGH

## 2010-10-19 LAB — LIPASE, BLOOD
Lipase: 20
Lipase: 14

## 2010-10-19 LAB — CBC
HCT: 43.7
HCT: 43.5
Hemoglobin: 14.9
Hemoglobin: 14.6
MCHC: 34.2
MCHC: 33.5
MCV: 93.9
MCV: 93.7
Platelets: 377
Platelets: 501 — ABNORMAL HIGH
RBC: 4.66
RBC: 4.65
RDW: 12.5
RDW: 12.7
WBC: 7.8
WBC: 13.1 — ABNORMAL HIGH

## 2010-10-19 LAB — POCT CARDIAC MARKERS
CKMB, poc: 1.4
Myoglobin, poc: 66.3
Troponin i, poc: <0.05

## 2010-10-19 LAB — HEPATIC FUNCTION PANEL
ALT: 10
AST: 16
Albumin: 3.5
Alkaline Phosphatase: 83
Bilirubin, Direct: 0.1
Indirect Bilirubin: 0.9
Total Bilirubin: 1
Total Protein: 7.6

## 2010-10-19 LAB — PROTIME-INR
INR: 1
Prothrombin Time: 12.9

## 2010-10-19 LAB — URINE CULTURE: Colony Count: 7000

## 2010-10-19 LAB — PREGNANCY, URINE
Preg Test, Ur: NEGATIVE
Preg Test, Ur: NEGATIVE

## 2010-10-19 LAB — KETONES, QUALITATIVE

## 2010-10-19 LAB — POTASSIUM: Potassium: 5.7 — ABNORMAL HIGH

## 2010-10-19 LAB — APTT: aPTT: 29

## 2010-10-21 LAB — URINALYSIS, ROUTINE W REFLEX MICROSCOPIC
Bilirubin Urine: NEGATIVE
Glucose, UA: >1000 — AB
Hgb urine dipstick: NEGATIVE
Ketones, ur: NEGATIVE
Leukocytes, UA: NEGATIVE
Nitrite: NEGATIVE
Protein, ur: NEGATIVE
Specific Gravity, Urine: <1.005 — ABNORMAL LOW
Urobilinogen, UA: 0.2
pH: 6.5

## 2010-10-21 LAB — POCT I-STAT, CHEM 8
BUN: 10
Calcium, Ion: 1.12
Chloride: 104
Creatinine, Ser: 0.7
Glucose, Bld: 371 — ABNORMAL HIGH
HCT: 39
Hemoglobin: 13.3
Potassium: 4.5
Sodium: 136
TCO2: 25

## 2010-10-21 LAB — URINE MICROSCOPIC-ADD ON

## 2010-10-26 DIAGNOSIS — F32A Depression, unspecified: Secondary | ICD-10-CM | POA: Insufficient documentation

## 2010-10-26 DIAGNOSIS — R188 Other ascites: Secondary | ICD-10-CM | POA: Insufficient documentation

## 2010-10-26 DIAGNOSIS — R609 Edema, unspecified: Secondary | ICD-10-CM | POA: Insufficient documentation

## 2010-10-27 DIAGNOSIS — E785 Hyperlipidemia, unspecified: Secondary | ICD-10-CM | POA: Insufficient documentation

## 2010-10-27 DIAGNOSIS — R809 Proteinuria, unspecified: Secondary | ICD-10-CM | POA: Insufficient documentation

## 2010-10-27 LAB — CBC
HCT: 41.3
Hemoglobin: 14.3
MCHC: 34.6
MCV: 93.2
Platelets: 342
RBC: 4.44
RDW: 12.3
WBC: 7.6

## 2010-10-27 LAB — BASIC METABOLIC PANEL
BUN: 11
CO2: 25
Calcium: 8.7
Chloride: 103
Creatinine, Ser: 1.1
GFR calc Af Amer: >60
GFR calc non Af Amer: 56 — ABNORMAL LOW
Glucose, Bld: 273 — ABNORMAL HIGH
Potassium: 4.2
Sodium: 135

## 2010-10-27 LAB — COMPREHENSIVE METABOLIC PANEL
ALT: 12
AST: 20
Albumin: 3.2 — ABNORMAL LOW
Alkaline Phosphatase: 87
BUN: 11
CO2: 29
Calcium: 9.3
Chloride: 106
Creatinine, Ser: 1.11
GFR calc Af Amer: >60
GFR calc non Af Amer: 55 — ABNORMAL LOW
Glucose, Bld: 270 — ABNORMAL HIGH
Potassium: 4.5
Sodium: 140
Total Bilirubin: 0.5
Total Protein: 6

## 2010-10-27 LAB — LIPASE, BLOOD: Lipase: 17

## 2010-10-27 LAB — URINE MICROSCOPIC-ADD ON

## 2010-10-27 LAB — URINALYSIS, ROUTINE W REFLEX MICROSCOPIC
Bilirubin Urine: NEGATIVE
Glucose, UA: >1000 — AB
Hgb urine dipstick: NEGATIVE
Ketones, ur: NEGATIVE
Leukocytes, UA: NEGATIVE
Nitrite: NEGATIVE
Protein, ur: 100 — AB
Specific Gravity, Urine: 1.025
Urobilinogen, UA: 0.2
pH: 6

## 2010-10-27 LAB — DIFFERENTIAL
Basophils Absolute: 0
Basophils Relative: 1
Eosinophils Absolute: 0.2
Eosinophils Relative: 3
Lymphocytes Relative: 18
Lymphs Abs: 1.3
Monocytes Absolute: 0.5
Monocytes Relative: 6
Neutro Abs: 5.5
Neutrophils Relative %: 73

## 2010-10-27 LAB — PREGNANCY, URINE: Preg Test, Ur: NEGATIVE

## 2010-10-29 LAB — URINALYSIS, ROUTINE W REFLEX MICROSCOPIC
Bilirubin Urine: NEGATIVE
Glucose, UA: >1000 — AB
Ketones, ur: NEGATIVE
Leukocytes, UA: NEGATIVE
Nitrite: NEGATIVE
Protein, ur: 100 — AB
Specific Gravity, Urine: 1.02
Urobilinogen, UA: 0.2
pH: 6

## 2010-10-29 LAB — COMPREHENSIVE METABOLIC PANEL
ALT: 13
AST: 22
Albumin: 3.6
Alkaline Phosphatase: 78
BUN: 12
CO2: 27
Calcium: 9.6
Chloride: 104
Creatinine, Ser: 0.99
GFR calc Af Amer: >60
GFR calc non Af Amer: >60
Glucose, Bld: 166 — ABNORMAL HIGH
Potassium: 3.7
Sodium: 139
Total Bilirubin: 0.5
Total Protein: 7.2

## 2010-10-29 LAB — URINE MICROSCOPIC-ADD ON

## 2010-10-29 LAB — DIFFERENTIAL
Basophils Absolute: 0.1
Basophils Relative: 1
Eosinophils Absolute: 0.2
Eosinophils Relative: 3
Lymphocytes Relative: 26
Lymphs Abs: 1.7
Monocytes Absolute: 0.6
Monocytes Relative: 9
Neutro Abs: 4
Neutrophils Relative %: 60

## 2010-10-29 LAB — PREGNANCY, URINE: Preg Test, Ur: NEGATIVE

## 2010-10-29 LAB — CBC
HCT: 43.1
Hemoglobin: 14.9
MCHC: 34.6
MCV: 94.4
Platelets: 422 — ABNORMAL HIGH
RBC: 4.56
RDW: 13.4
WBC: 6.6

## 2010-11-03 ENCOUNTER — Emergency Department (HOSPITAL_COMMUNITY)
Admission: EM | Admit: 2010-11-03 | Discharge: 2010-11-03 | Disposition: A | Discharge status: Home / Self Care | Payer: Medicaid Other | Attending: Emergency Medicine | Admitting: Emergency Medicine

## 2010-11-03 ENCOUNTER — Encounter (HOSPITAL_COMMUNITY): Payer: Self-pay | Admitting: Emergency Medicine

## 2010-11-03 ENCOUNTER — Other Ambulatory Visit: Payer: Self-pay

## 2010-11-03 DIAGNOSIS — Z794 Long term (current) use of insulin: Secondary | ICD-10-CM | POA: Insufficient documentation

## 2010-11-03 DIAGNOSIS — E119 Type 2 diabetes mellitus without complications: Secondary | ICD-10-CM | POA: Insufficient documentation

## 2010-11-03 DIAGNOSIS — R112 Nausea with vomiting, unspecified: Secondary | ICD-10-CM | POA: Insufficient documentation

## 2010-11-03 DIAGNOSIS — R109 Unspecified abdominal pain: Secondary | ICD-10-CM | POA: Insufficient documentation

## 2010-11-03 DIAGNOSIS — R739 Hyperglycemia, unspecified: Secondary | ICD-10-CM

## 2010-11-03 DIAGNOSIS — I4460 Unspecified fascicular block: Secondary | ICD-10-CM | POA: Insufficient documentation

## 2010-11-03 DIAGNOSIS — R000 Tachycardia, unspecified: Secondary | ICD-10-CM | POA: Insufficient documentation

## 2010-11-03 DIAGNOSIS — N289 Disorder of kidney and ureter, unspecified: Secondary | ICD-10-CM | POA: Insufficient documentation

## 2010-11-03 DIAGNOSIS — R10812 Left upper quadrant abdominal tenderness: Secondary | ICD-10-CM | POA: Insufficient documentation

## 2010-11-03 LAB — COMPREHENSIVE METABOLIC PANEL
ALT: 15 U/L (ref 0–35)
AST: 15 U/L (ref 0–37)
Albumin: 1.5 g/dL — ABNORMAL LOW (ref 3.5–5.2)
Alkaline Phosphatase: 95 U/L (ref 39–117)
BUN: 17 mg/dL (ref 6–23)
CO2: 18 mEq/L — ABNORMAL LOW (ref 19–32)
Calcium: 9.2 mg/dL (ref 8.4–10.5)
Chloride: 92 mEq/L — ABNORMAL LOW (ref 96–112)
Creatinine, Ser: 1.83 mg/dL — ABNORMAL HIGH (ref 0.50–1.10)
GFR calc Af Amer: 39 mL/min — ABNORMAL LOW (ref >90)
GFR calc non Af Amer: 33 mL/min — ABNORMAL LOW (ref >90)
Glucose, Bld: 677 mg/dL (ref 70–99)
Potassium: 4.9 mEq/L (ref 3.5–5.1)
Sodium: 130 mEq/L — ABNORMAL LOW (ref 135–145)
Total Bilirubin: 0.4 mg/dL (ref 0.3–1.2)
Total Protein: 5.8 g/dL — ABNORMAL LOW (ref 6.0–8.3)

## 2010-11-03 LAB — GLUCOSE, CAPILLARY
Glucose-Capillary: >600 mg/dL (ref 70–99)
Glucose-Capillary: >600 mg/dL (ref 70–99)
Glucose-Capillary: 544 mg/dL — ABNORMAL HIGH (ref 70–99)
Glucose-Capillary: 481 mg/dL — ABNORMAL HIGH (ref 70–99)

## 2010-11-03 LAB — CBC
HCT: 38.7 % (ref 36.0–46.0)
Hemoglobin: 12.3 g/dL (ref 12.0–15.0)
MCH: 29.6 pg (ref 26.0–34.0)
MCHC: 31.8 g/dL (ref 30.0–36.0)
MCV: 93.3 fL (ref 78.0–100.0)
Platelets: 507 10*3/uL — ABNORMAL HIGH (ref 150–400)
RBC: 4.15 MIL/uL (ref 3.87–5.11)
RDW: 13.7 % (ref 11.5–15.5)
WBC: 12.1 10*3/uL — ABNORMAL HIGH (ref 4.0–10.5)

## 2010-11-03 LAB — URINALYSIS, ROUTINE W REFLEX MICROSCOPIC
Bilirubin Urine: NEGATIVE
Glucose, UA: >1000 mg/dL — AB
Ketones, ur: 40 mg/dL — AB
Leukocytes, UA: NEGATIVE
Nitrite: NEGATIVE
Protein, ur: >300 mg/dL — AB
Specific Gravity, Urine: 1.02 (ref 1.005–1.030)
Urobilinogen, UA: 0.2 mg/dL (ref 0.0–1.0)
pH: 6 (ref 5.0–8.0)

## 2010-11-03 LAB — DIFFERENTIAL
Basophils Absolute: 0.1 10*3/uL (ref 0.0–0.1)
Basophils Relative: 0 % (ref 0–1)
Eosinophils Absolute: 0.1 10*3/uL (ref 0.0–0.7)
Eosinophils Relative: 1 % (ref 0–5)
Lymphocytes Relative: 11 % — ABNORMAL LOW (ref 12–46)
Lymphs Abs: 1.4 10*3/uL (ref 0.7–4.0)
Monocytes Absolute: 0.6 10*3/uL (ref 0.1–1.0)
Monocytes Relative: 5 % (ref 3–12)
Neutro Abs: 10 10*3/uL — ABNORMAL HIGH (ref 1.7–7.7)
Neutrophils Relative %: 83 % — ABNORMAL HIGH (ref 43–77)

## 2010-11-03 LAB — LIPASE, BLOOD: Lipase: 10 U/L — ABNORMAL LOW (ref 11–59)

## 2010-11-03 LAB — URINE MICROSCOPIC-ADD ON

## 2010-11-03 LAB — BLOOD GAS, VENOUS
Acid-base deficit: 9.4 mmol/L — ABNORMAL HIGH (ref 0.0–2.0)
Bicarbonate: 16.5 mEq/L — ABNORMAL LOW (ref 20.0–24.0)
O2 Content: 2 L/min
O2 Saturation: 72.3 %
TCO2: 15.4 mmol/L (ref 0–100)
pCO2, Ven: 37.5 mmHg — ABNORMAL LOW (ref 45.0–50.0)
pH, Ven: 7.266 (ref 7.250–7.300)
pO2, Ven: 42.1 mmHg (ref 30.0–45.0)

## 2010-11-03 MED ORDER — INSULIN ASPART 100 UNIT/ML ~~LOC~~ SOLN
10.0000 [IU] | Freq: Once | SUBCUTANEOUS | Status: AC
  Administered 2010-11-03: 10 [IU] via INTRAVENOUS

## 2010-11-03 MED ORDER — HYDROMORPHONE HCL 1 MG/ML IJ SOLN
1.0000 mg | Freq: Once | INTRAMUSCULAR | Status: AC
  Administered 2010-11-03: 1 mg via INTRAVENOUS
  Filled 2010-11-03: qty 1

## 2010-11-03 MED ORDER — SODIUM CHLORIDE 0.9 % IV BOLUS (SEPSIS)
1000.0000 mL | Freq: Once | INTRAVENOUS | Status: AC
  Administered 2010-11-03: 1000 mL via INTRAVENOUS

## 2010-11-03 MED ORDER — ONDANSETRON HCL 4 MG/2ML IJ SOLN
4.0000 mg | Freq: Once | INTRAMUSCULAR | Status: AC
  Administered 2010-11-03: 4 mg via INTRAVENOUS

## 2010-11-03 MED ORDER — ONDANSETRON HCL 4 MG/2ML IJ SOLN: 4.0000 mg | Freq: Once | INTRAMUSCULAR | Status: DC

## 2010-11-03 MED ORDER — ONDANSETRON HCL 4 MG/2ML IJ SOLN
INTRAMUSCULAR | Status: AC
  Filled 2010-11-03: qty 2

## 2010-11-03 NOTE — ED Notes (Signed)
Patient medicated for continued pain.  Patient remains alert and oriented.

## 2010-11-03 NOTE — ED Notes (Signed)
B/P 90/54  HR  120  1 liter bolus still infusing with approx. Ml left to infuse

## 2010-11-03 NOTE — ED Notes (Signed)
Patient given ginger ale and ice chips per request.

## 2010-11-03 NOTE — ED Provider Notes (Signed)
History  Scribed for Dr. Vanita Panda, the patient was seen in room APA08. The chart was scribed by Clarisa Fling. The patients care was started at 1620. CSN: [id number] Arrival date & time: 11/03/2010  4:15 PM   First MD Initiated Contact with Patient 11/03/10 1609      Chief Complaint  Patient presents with  . Hyperglycemia    HPI [name] is a 40 y.o. female who presents to the Emergency Department complaining of hyperglycemia. Pt reports sudden onset today of LUQ pain with some pain radiating into chest and left shoulder and vomiting. Symptoms were not alleviated by pain pill. Pt had left kidney biopsy on Friday (4 days ago) at [facility]. Additionally notes being hooked up to IV with Lasix for six days at [facility] before biopsy. States she has stopped taking blood pressure pills to treat kidney. Denies any diarrhea, dysuria, difficulty urinating, confusion, visual changes, difficulty speaking or swallowing, or any other pain. Pt has never been on dialysis. There are no other associated symptoms and no other alleviating or aggravating factors.    Past Medical History  Diagnosis Date  . Diabetes mellitus   . 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 Wrist surgery     left  . Tubal ligation     Family History  Problem Relation Age of Onset  . COPD Mother   . Hypertension Mother   . Diabetes Father   . Hypertension Father     History  Substance Use Topics  . Smoking status: Current Everyday Smoker -- 0.5 packs/day for 20 years    Types: Cigarettes  . Smokeless tobacco: Never Used  . Alcohol Use: No    OB History    Grav Para Term Preterm Abortions TAB SAB Ect Mult Living   1 1  1      1       Review of Systems  HENT: Negative for trouble swallowing.   Cardiovascular: Positive for chest pain.  Gastrointestinal: Positive for vomiting and abdominal pain. Negative for diarrhea.  Genitourinary: Negative for dysuria, decreased urine  volume and difficulty urinating.  Musculoskeletal:       Shoulder pain  Psychiatric/Behavioral: Negative for confusion.  All other systems reviewed and are negative.    Allergies  Aspirin  Home Medications   Current Outpatient Rx  Name Route Sig Dispense Refill  . ALPRAZOLAM 1 MG PO TABS Oral Take 1 mg by mouth daily as needed. Anxiety    . ASPIRIN-ACETAMINOPHEN-CAFFEINE 250-250-65 MG PO TABS Oral Take 1 tablet by mouth every 6 (six) hours as needed. Migraine Headache     . HYDROCODONE-ACETAMINOPHEN 7.5-325 MG PO TABS Oral Take 1 tablet by mouth every 6 (six) hours as needed. Pain     . INSULIN GLARGINE 100 UNIT/ML [place] SOLN Subcutaneous Inject 18 Units into the skin 2 (two) times daily.      . INSULIN REGULAR HUMAN 100 UNIT/ML IJ SOLN Subcutaneous Inject 3-12 Units into the skin 3 (three) times daily before meals. Patient injects according to a sliding scale she has at home.     Marland Kitchen POTASSIUM CHLORIDE CRYS CR 20 MEQ PO TBCR Oral Take 0.5 tablets (10 mEq total) by mouth daily. 7 tablet 0  . SERTRALINE HCL 50 MG PO TABS Oral Take 50 mg by mouth daily.        BP 90/65  Pulse 116  Temp 98.3 F (36.8 C)  Resp 22  Ht 5\' 1"  (  1.549 m)  Wt 125 lb (56.7 kg)  BMI 23.62 kg/m2  SpO2 96%  LMP 10/19/2010  Physical Exam  Constitutional: She is oriented to person, place, and time. She appears well-developed and well-nourished.  Non-toxic appearance. She does not have a sickly appearance.  HENT:  Head: Normocephalic and atraumatic.  Eyes: Conjunctivae, EOM and lids are normal. Pupils are equal, round, and reactive to light. No scleral icterus.  Neck: Trachea normal and normal range of motion. Neck supple.  Cardiovascular: Regular rhythm and normal heart sounds.  Tachycardia present.   Pulmonary/Chest: Effort normal and breath sounds normal.  Abdominal: Soft. Normal appearance. There is tenderness (to palpation ) in the left upper quadrant. There is no rebound, no guarding and no CVA tenderness.   Musculoskeletal: Normal range of motion.  Neurological: She is alert and oriented to person, place, and time. She has normal strength.  Skin: Skin is warm, dry and intact. No rash noted.    ED Course  Procedures  DIAGNOSTIC STUDIES: Oxygen Saturation is 96% on , normal by my interpretation.     Date: 11/03/2010  Rate:117  Rhythm: sinus tachycardia  QRS Axis: indeterminate  Intervals: normal  ST/T Wave abnormalities: nonspecific T wave changes  Conduction Disutrbances:left anterior fascicular block  Narrative Interpretation:   Old EKG Reviewed: unchanged and no acute differences    Results for orders placed during the hospital encounter of 11/03/10  CBC      Component Value Range   WBC 12.1 (*) 4.0 - 10.5 (K/uL)   RBC 4.15  3.87 - 5.11 (MIL/uL)   Hemoglobin 12.3  12.0 - 15.0 (g/dL)   HCT 38.7  36.0 - 46.0 (%)   MCV 93.3  78.0 - 100.0 (fL)   MCH 29.6  26.0 - 34.0 (pg)   MCHC 31.8  30.0 - 36.0 (g/dL)   RDW 13.7  11.5 - 15.5 (%)   Platelets 507 (*) 150 - 400 (K/uL)  DIFFERENTIAL      Component Value Range   Neutrophils Relative 83 (*) 43 - 77 (%)   Neutro Abs 10.0 (*) 1.7 - 7.7 (K/uL)   Lymphocytes Relative 11 (*) 12 - 46 (%)   Lymphs Abs 1.4  0.7 - 4.0 (K/uL)   Monocytes Relative 5  3 - 12 (%)   Monocytes Absolute 0.6  0.1 - 1.0 (K/uL)   Eosinophils Relative 1  0 - 5 (%)   Eosinophils Absolute 0.1  0.0 - 0.7 (K/uL)   Basophils Relative 0  0 - 1 (%)   Basophils Absolute 0.1  0.0 - 0.1 (K/uL)  COMPREHENSIVE METABOLIC PANEL      Component Value Range   Sodium 130 (*) 135 - 145 (mEq/L)   Potassium 4.9  3.5 - 5.1 (mEq/L)   Chloride 92 (*) 96 - 112 (mEq/L)   CO2 18 (*) 19 - 32 (mEq/L)   Glucose, Bld 677 (*) 70 - 99 (mg/dL)   BUN 17  6 - 23 (mg/dL)   Creatinine, Ser 1.83 (*) 0.50 - 1.10 (mg/dL)   Calcium 9.2  8.4 - 10.5 (mg/dL)   Total Protein 5.8 (*) 6.0 - 8.3 (g/dL)   Albumin 1.5 (*) 3.5 - 5.2 (g/dL)   AST 15  0 - 37 (U/L)   ALT 15  0 - 35 (U/L)   Alkaline  Phosphatase 95  39 - 117 (U/L)   Total Bilirubin 0.4  0.3 - 1.2 (mg/dL)   GFR calc non Af Amer 33 (*) >90 (mL/min)  GFR calc Af Amer 39 (*) >90 (mL/min)  URINALYSIS, ROUTINE W REFLEX MICROSCOPIC      Component Value Range   Color, Urine YELLOW  YELLOW    Appearance HAZY (*) CLEAR    Specific Gravity, Urine 1.020  1.005 - 1.030    pH 6.0  5.0 - 8.0    Glucose, UA >1000 (*) NEGATIVE (mg/dL)   Hgb urine dipstick MODERATE (*) NEGATIVE    Bilirubin Urine NEGATIVE  NEGATIVE    Ketones, ur 40 (*) NEGATIVE (mg/dL)   Protein, ur >300 (*) NEGATIVE (mg/dL)   Urobilinogen, UA 0.2  0.0 - 1.0 (mg/dL)   Nitrite NEGATIVE  NEGATIVE    Leukocytes, UA NEGATIVE  NEGATIVE   BLOOD GAS, VENOUS      Component Value Range   O2 Content 2.0     Delivery systems NASAL CANNULA     pH, Ven 7.266  7.250 - 7.300    pCO2, Ven 37.5 (*) 45.0 - 50.0 (mmHg)   pO2, Ven 42.1  30.0 - 45.0 (mmHg)   Bicarbonate 16.5 (*) 20.0 - 24.0 (mEq/L)   TCO2 15.4  0 - 100 (mmol/L)   Acid-base deficit 9.4 (*) 0.0 - 2.0 (mmol/L)   O2 Saturation 72.3     Sample type VENOUS    GLUCOSE, CAPILLARY      Component Value Range   Glucose-Capillary >600 (*) 70 - 99 (mg/dL)   Comment 1 Documented in Chart     Comment 2 Notify RN    LIPASE, BLOOD      Component Value Range   Lipase 10 (*) 11 - 59 (U/L)  GLUCOSE, CAPILLARY      Component Value Range   Glucose-Capillary >600 (*) 70 - 99 (mg/dL)   Comment 1 Documented in Chart     Comment 2 Notify RN    URINE MICROSCOPIC-ADD ON      Component Value Range   Squamous Epithelial / LPF MANY (*) RARE    WBC, UA 3-6  <3 (WBC/hpf)   RBC / HPF 0-2  <3 (RBC/hpf)   Bacteria, UA FEW (*) RARE   GLUCOSE, CAPILLARY      Component Value Range   Glucose-Capillary 544 (*) 70 - 99 (mg/dL)     COORDINATION OF CARE: 1620:  - Patient evaluated by ED physician, EKG, Labs, NovoLOG, ordered      MDM  I personally performed the services described in this documentation, which was scribed in my  presence. The recorded information has been reviewed and considered.  This 40 year old female with a history of renal dysfunction, insulin-dependent diabetes, and baseline hypotension presents today after discharge from [facility] with fatigue, left flank pain, and concerns of her hyperglycemia. Patient is in no distress on initial exam she is tachycardic. Initial glucose notable for being greater than 600. Following a prolonged ED stay, with significant rehydration, bolus dosing of insulin, and analgesics, the patient notes that she is doing significantly better. Her heart rate has decreased notably, her blood glucose is now less than 500, and she notes insignificant amounts of pain. The patient's labs reflect her recent intervention of the kidney, with hemoglobin in the urine. Although other labs are abnormal, they are acceptable for this patient's baseline pathology. Given her improvement in symptoms, her easy access to follow up care, it was gone, she'll be discharged to follow up with her nephrologist, calling tomorrow.      Carmin Muskrat, MD 11/03/10 2209

## 2010-11-03 NOTE — ED Notes (Signed)
Per Lab Blood Sugar 677--Insulin given as ordered  B/P  97/52  HR  122  P.O. 100%

## 2010-11-03 NOTE — ED Notes (Signed)
2nd liter of normal saline infusing via pump--pt. C/o nausea

## 2010-11-03 NOTE — ED Notes (Signed)
Pt c/o sudden onset  luq pain with some pain radiating into chest and left shoulder. Pt had left kidney biopsy on Friday. Pt cbg "high" per ems. nad noted.

## 2010-11-03 NOTE — ED Notes (Signed)
Patient ambulated to bathroom; patient vomiting.  CBG rechecked, continues to read high.  Dr. Vanita Panda and orders received for Zofran and additional Insulin.

## 2010-11-04 LAB — BASIC METABOLIC PANEL
BUN: 5 — ABNORMAL LOW
CO2: 30
Calcium: 9.1
Chloride: 103
Creatinine, Ser: 0.52
GFR calc Af Amer: >60
GFR calc non Af Amer: >60
Glucose, Bld: 71
Potassium: 3.5
Sodium: 139

## 2010-12-04 ENCOUNTER — Emergency Department (HOSPITAL_COMMUNITY)
Admission: EM | Admit: 2010-12-04 | Discharge: 2010-12-04 | Disposition: A | Discharge status: Home / Self Care | Payer: Medicaid Other | Attending: Emergency Medicine | Admitting: Emergency Medicine

## 2010-12-04 ENCOUNTER — Encounter (HOSPITAL_COMMUNITY): Payer: Self-pay

## 2010-12-04 DIAGNOSIS — E119 Type 2 diabetes mellitus without complications: Secondary | ICD-10-CM | POA: Insufficient documentation

## 2010-12-04 DIAGNOSIS — N289 Disorder of kidney and ureter, unspecified: Secondary | ICD-10-CM | POA: Insufficient documentation

## 2010-12-04 DIAGNOSIS — Z794 Long term (current) use of insulin: Secondary | ICD-10-CM | POA: Insufficient documentation

## 2010-12-04 DIAGNOSIS — F17200 Nicotine dependence, unspecified, uncomplicated: Secondary | ICD-10-CM | POA: Insufficient documentation

## 2010-12-04 DIAGNOSIS — B029 Zoster without complications: Secondary | ICD-10-CM | POA: Insufficient documentation

## 2010-12-04 LAB — GLUCOSE, CAPILLARY: Glucose-Capillary: 116 mg/dL — ABNORMAL HIGH (ref 70–99)

## 2010-12-04 MED ORDER — OXYCODONE-ACETAMINOPHEN 5-325 MG PO TABS: 2.0000 | ORAL_TABLET | ORAL | Status: AC | PRN

## 2010-12-04 MED ORDER — VALACYCLOVIR HCL 1 G PO TABS: 1000.0000 mg | ORAL_TABLET | Freq: Three times a day (TID) | ORAL | Status: AC

## 2010-12-04 MED ORDER — VALACYCLOVIR HCL 500 MG PO TABS
1000.0000 mg | ORAL_TABLET | Freq: Once | ORAL | Status: AC
  Administered 2010-12-04: 1000 mg via ORAL
  Filled 2010-12-04: qty 2

## 2010-12-04 MED ORDER — OXYCODONE-ACETAMINOPHEN 5-325 MG PO TABS
2.0000 | ORAL_TABLET | Freq: Once | ORAL | Status: AC
  Administered 2010-12-04: 2 via ORAL
  Filled 2010-12-04 (×2): qty 2

## 2010-12-04 NOTE — ED Provider Notes (Signed)
History     CSN: [id number] Arrival date & time: 12/04/2010 12:15 PM   First MD Initiated Contact with Patient 12/04/10 1251      Chief Complaint  Patient presents with  . Herpes Zoster    (Consider location/radiation/quality/duration/timing/severity/associated sxs/prior treatment) HPI Comments: History of diabetes and chronic in disease presenting with painful itchy rash to back and chest that started last night. She noticed a few painful bumps to her midback last night and this morning her pain increase in the bumps it spread across her back, side and chest under her breast. She's not take any medications at home. She denies any fevers, nausea, vomiting, shortness of breath. He admits her sugars have been running high.  The history is provided by the patient.    Past Medical History  Diagnosis Date  . Diabetes mellitus   . Renal disorder     Past Surgical History  Procedure Date  . Wrist surgery     left  . Tubal ligation     Family History  Problem Relation Age of Onset  . COPD Mother   . Hypertension Mother   . Diabetes Father   . Hypertension Father     History  Substance Use Topics  . Smoking status: Current Everyday Smoker -- 0.5 packs/day for 20 years    Types: Cigarettes  . Smokeless tobacco: Never Used  . Alcohol Use: No    OB History    Grav Para Term Preterm Abortions TAB SAB Ect Mult Living   1 1  1      1       Review of Systems  Constitutional: Negative for fever, activity change and appetite change.  HENT: Negative for congestion and rhinorrhea.   Respiratory: Negative for cough and shortness of breath.   Cardiovascular: Negative for chest pain.  Gastrointestinal: Negative for nausea, vomiting and abdominal pain.  Genitourinary: Negative for dysuria and hematuria.  Musculoskeletal: Positive for back pain.  Skin: Positive for rash.  Neurological: Negative for dizziness, weakness and headaches.    Allergies  Aspirin and  Ibuprofen  Home Medications   Current Outpatient Rx  Name Route Sig Dispense Refill  . ALPRAZOLAM 1 MG PO TABS Oral Take 1 mg by mouth daily as needed. Anxiety    . OMEGA-3 FATTY ACIDS 1000 MG PO CAPS Oral Take 1 g by mouth daily.     . INSULIN GLARGINE 100 UNIT/ML [place] SOLN Subcutaneous Inject 18 Units into the skin 2 (two) times daily.      . INSULIN REGULAR HUMAN 100 UNIT/ML IJ SOLN Subcutaneous Inject 3-15 Units into the skin 3 (three) times daily as needed. Using according to sliding scale at home.     Marland Kitchen LOSARTAN POTASSIUM 25 MG PO TABS Oral Take 12.5 mg by mouth daily.      Marland Kitchen POTASSIUM CHLORIDE CRYS CR 20 MEQ PO TBCR Oral Take 0.5 tablets (10 mEq total) by mouth daily. 7 tablet 0  . SERTRALINE HCL 50 MG PO TABS Oral Take 50 mg by mouth daily.      . FUROSEMIDE 20 MG PO TABS Oral Take 20 mg by mouth 3 (three) times a week. On Mondays, Wednesdays, and Fridays     . OXYCODONE-ACETAMINOPHEN 5-325 MG PO TABS Oral Take 2 tablets by mouth every 4 (four) hours as needed for pain. 15 tablet 0  . VALACYCLOVIR HCL 1 G PO TABS Oral Take 1 tablet (1,000 mg total) by mouth 3 (three) times daily. 30 tablet 0  BP 145/74  Pulse 80  Temp(Src) 98.2 F (36.8 C) (Oral)  Resp 16  Ht 5\' 1"  (1.549 m)  Wt 128 lb (58.06 kg)  BMI 24.19 kg/m2  SpO2 100%  LMP 11/28/2010  Physical Exam  Constitutional: She is oriented to person, place, and time. She appears well-developed and well-nourished. No distress.  HENT:  Head: Normocephalic and atraumatic.  Mouth/Throat: Oropharynx is clear and moist. No oropharyngeal exudate.  Eyes: Conjunctivae are normal. Pupils are equal, round, and reactive to light.  Neck: Normal range of motion.  Cardiovascular: Normal rate, regular rhythm and normal heart sounds.   Pulmonary/Chest: Effort normal and breath sounds normal. No respiratory distress.  Abdominal: Soft. There is no tenderness. There is no rebound and no guarding.  Musculoskeletal: Normal range of motion. She  exhibits no edema and no tenderness.  Neurological: She is alert and oriented to person, place, and time. No cranial nerve deficit.  Skin: Skin is warm. Rash noted.          Vesicular rash on erythematous base in mid thoracic back, flank, chest wall under her right breast and dermatomal distribution.    ED Course  Procedures (including critical care time)  Labs Reviewed  GLUCOSE, CAPILLARY - Abnormal; Notable for the following:    Glucose-Capillary 116 (*)    All other components within normal limits   No results found.   1. Herpes zoster       MDM  Painful erythematous rash consistent with herpes zoster. Onset 24 hours ago. We'll treat with antivirals, pain medication. Corticosteroids are not indicated, especially with patient's history of diabetes.  Recheck with Dr. Luan Pulling on Monday.       Ezequiel Essex, MD 12/04/10 [phone]

## 2010-12-04 NOTE — ED Notes (Signed)
Pt presents with rash and blisters along right side and under right breast. Pt states blisters are painful.

## 2010-12-04 NOTE — ED Notes (Signed)
Patient just wants to know what the verdict is and if she can go home.

## 2010-12-23 ENCOUNTER — Encounter (HOSPITAL_COMMUNITY): Payer: Self-pay | Admitting: *Deleted

## 2010-12-23 ENCOUNTER — Emergency Department (HOSPITAL_COMMUNITY)
Admission: EM | Admit: 2010-12-23 | Discharge: 2010-12-23 | Disposition: A | Discharge status: Home / Self Care | Payer: Medicaid Other | Attending: Emergency Medicine | Admitting: Emergency Medicine

## 2010-12-23 DIAGNOSIS — E119 Type 2 diabetes mellitus without complications: Secondary | ICD-10-CM | POA: Insufficient documentation

## 2010-12-23 DIAGNOSIS — N289 Disorder of kidney and ureter, unspecified: Secondary | ICD-10-CM | POA: Insufficient documentation

## 2010-12-23 DIAGNOSIS — Z794 Long term (current) use of insulin: Secondary | ICD-10-CM | POA: Insufficient documentation

## 2010-12-23 DIAGNOSIS — B0229 Other postherpetic nervous system involvement: Secondary | ICD-10-CM

## 2010-12-23 DIAGNOSIS — F17200 Nicotine dependence, unspecified, uncomplicated: Secondary | ICD-10-CM | POA: Insufficient documentation

## 2010-12-23 HISTORY — DX: Zoster without complications: B02.9

## 2010-12-23 MED ORDER — DEXAMETHASONE 6 MG PO TABS: ORAL_TABLET | ORAL | Status: AC

## 2010-12-23 MED ORDER — OXYCODONE-ACETAMINOPHEN 5-500 MG PO CAPS: 1.0000 | ORAL_CAPSULE | ORAL | Status: AC | PRN

## 2010-12-23 NOTE — ED Notes (Signed)
Was seen and tx in this ED on 12/04/10 for shingles; pt reports continued right-sided back pain and lateral chest wall pain; no rash/blisters noted at present

## 2010-12-23 NOTE — ED Notes (Signed)
Pt states was treated here on 12/04/10; dx with shingles, pt reports pain in mid rt back is still intense. Pt has noted red rash like area extending from spine to under rt breast.  No blisters or drainage noted from "rash".

## 2010-12-23 NOTE — ED Provider Notes (Signed)
History     CSN: [id number] Arrival date & time: 12/23/2010  1:17 PM   First MD Initiated Contact with Patient 12/23/10 1457      Chief Complaint  Patient presents with  . Back Pain    (Consider location/radiation/quality/duration/timing/severity/associated sxs/prior treatment) Patient is a 40 y.o. female presenting with back pain. The history is provided by the patient.  Back Pain  This is a recurrent problem. The problem occurs constantly. The problem has been gradually worsening. Associated with: SHINGLES. Pain location: Left back and flank. The quality of the pain is described as aching and burning. The pain is at a severity of 10/10. The pain is severe. The symptoms are aggravated by bending, twisting and certain positions. The pain is the same all the time. Associated symptoms include tingling. Pertinent negatives include no chest pain, no fever, no abdominal pain, no bowel incontinence, no perianal numbness, no bladder incontinence and no dysuria. Treatments tried: Antihistamines. The treatment provided no relief.    Past Medical History  Diagnosis Date  . Diabetes mellitus   . Renal disorder   . Shingles     Past Surgical History  Procedure Date  . Wrist surgery     left  . Tubal ligation     Family History  Problem Relation Age of Onset  . COPD Mother   . Hypertension Mother   . Diabetes Father   . Hypertension Father     History  Substance Use Topics  . Smoking status: Current Everyday Smoker -- 0.5 packs/day for 20 years    Types: Cigarettes  . Smokeless tobacco: Never Used  . Alcohol Use: No    OB History    Grav Para Term Preterm Abortions TAB SAB Ect Mult Living   1 1  1      1       Review of Systems  Constitutional: Negative for fever and activity change.       All ROS Neg except as noted in HPI  HENT: Negative for nosebleeds and neck pain.   Eyes: Negative for photophobia and discharge.  Respiratory: Negative for cough, shortness of breath  and wheezing.   Cardiovascular: Negative for chest pain and palpitations.  Gastrointestinal: Negative for abdominal pain, blood in stool and bowel incontinence.  Genitourinary: Negative for bladder incontinence, dysuria, frequency and hematuria.  Musculoskeletal: Positive for back pain. Negative for arthralgias.  Skin: Negative.   Neurological: Positive for tingling. Negative for dizziness, seizures and speech difficulty.  Psychiatric/Behavioral: Negative for hallucinations and confusion.    Allergies  Aspirin and Ibuprofen  Home Medications   Current Outpatient Rx  Name Route Sig Dispense Refill  . ACETAMINOPHEN 500 MG PO TABS Oral Take 1,000 mg by mouth every 6 (six) hours as needed. For pain     . ALPRAZOLAM 1 MG PO TABS Oral Take 1 mg by mouth daily as needed. Anxiety    . DIPHENHYDRAMINE HCL 25 MG PO TABS Oral Take 25 mg by mouth at bedtime as needed. For sleep     . OMEGA-3 FATTY ACIDS 1000 MG PO CAPS Oral Take 1 g by mouth daily.     . FUROSEMIDE 20 MG PO TABS Oral Take 20 mg by mouth 3 (three) times a week. On Mondays, Wednesdays, and Fridays     . INSULIN ISOPHANE & REGULAR (70-30) 100 UNIT/ML [place] SUSP Subcutaneous Inject 12 Units into the skin 2 (two) times daily with a meal.      . INSULIN REGULAR HUMAN 100  UNIT/ML IJ SOLN Subcutaneous Inject 3-15 Units into the skin 3 (three) times daily as needed. Using according to sliding scale at home.     Marland Kitchen LOSARTAN POTASSIUM 25 MG PO TABS Oral Take 12.5 mg by mouth daily.      [place] M PLUS PO TABS Oral Take 1 tablet by mouth daily.      Marland Kitchen POTASSIUM CHLORIDE CRYS CR 20 MEQ PO TBCR Oral Take 0.5 tablets (10 mEq total) by mouth daily. 7 tablet 0  . SERTRALINE HCL 50 MG PO TABS Oral Take 50 mg by mouth daily.        BP 142/72  Pulse 110  Temp(Src) 98.3 F (36.8 C) (Oral)  Resp 20  Ht 5\' 7"  (1.702 m)  Wt 128 lb (58.06 kg)  BMI 20.05 kg/m2  SpO2 100%  LMP 11/28/2010  Physical Exam  Nursing note and vitals  reviewed. Constitutional: She is oriented to person, place, and time. She appears well-developed and well-nourished.  Non-toxic appearance.  HENT:  Head: Normocephalic.  Right Ear: Tympanic membrane and external ear normal.  Left Ear: Tympanic membrane and external ear normal.  Eyes: EOM and lids are normal. Pupils are equal, round, and reactive to light.  Neck: Normal range of motion. Neck supple. Carotid bruit is not present.  Cardiovascular: Normal rate, regular rhythm, normal heart sounds, intact distal pulses and normal pulses.   Pulmonary/Chest: Breath sounds normal. No respiratory distress.  Abdominal: Soft. Bowel sounds are normal. There is no tenderness. There is no guarding.  Musculoskeletal: Normal range of motion.  Lymphadenopathy:       Head (right side): No submandibular adenopathy present.       Head (left side): No submandibular adenopathy present.    She has no cervical adenopathy.  Neurological: She is alert and oriented to person, place, and time. She has normal strength. No cranial nerve deficit or sensory deficit.  Skin: Skin is warm and dry.       Drying lesions for shingles out break from the center of the back to the right back and flank extending under the right breast.  Psychiatric: She has a normal mood and affect. Her speech is normal.    ED Course  Procedures (including critical care time)  Labs Reviewed - No data to display No results found.   Dx: Post Herpetic Neuralgia   MDM  I have reviewed nursing notes, vital signs, and all appropriate lab and imaging results for this patient. Rx for dexamethasone and percocet given. Pt to see Dr Luan Pulling for follow up and recheck.Lenox Ahr, [place] 12/23/10 [phone]

## 2010-12-24 NOTE — ED Provider Notes (Signed)
Medical screening examination/treatment/procedure(s) were performed by non-physician practitioner and as supervising physician I was immediately available for consultation/collaboration.   [name], MD 12/24/10 0000

## 2011-01-09 ENCOUNTER — Emergency Department (HOSPITAL_COMMUNITY): Payer: Medicaid Other

## 2011-01-09 ENCOUNTER — Encounter (HOSPITAL_COMMUNITY): Payer: Self-pay | Admitting: *Deleted

## 2011-01-09 ENCOUNTER — Inpatient Hospital Stay (HOSPITAL_COMMUNITY)
Admission: EM | Admit: 2011-01-09 | Discharge: 2011-01-11 | DRG: 603 | Disposition: A | Discharge status: Home / Self Care | Payer: Medicaid Other | Attending: Pulmonary Disease | Admitting: Pulmonary Disease

## 2011-01-09 DIAGNOSIS — IMO0001 Reserved for inherently not codable concepts without codable children: Secondary | ICD-10-CM | POA: Diagnosis present

## 2011-01-09 DIAGNOSIS — IMO0002 Reserved for concepts with insufficient information to code with codable children: Secondary | ICD-10-CM | POA: Diagnosis present

## 2011-01-09 DIAGNOSIS — B029 Zoster without complications: Secondary | ICD-10-CM | POA: Diagnosis present

## 2011-01-09 DIAGNOSIS — R739 Hyperglycemia, unspecified: Secondary | ICD-10-CM

## 2011-01-09 DIAGNOSIS — B0229 Other postherpetic nervous system involvement: Secondary | ICD-10-CM

## 2011-01-09 DIAGNOSIS — I739 Peripheral vascular disease, unspecified: Secondary | ICD-10-CM | POA: Insufficient documentation

## 2011-01-09 DIAGNOSIS — E1065 Type 1 diabetes mellitus with hyperglycemia: Secondary | ICD-10-CM | POA: Diagnosis present

## 2011-01-09 DIAGNOSIS — E876 Hypokalemia: Secondary | ICD-10-CM | POA: Diagnosis present

## 2011-01-09 DIAGNOSIS — E871 Hypo-osmolality and hyponatremia: Secondary | ICD-10-CM | POA: Diagnosis not present

## 2011-01-09 DIAGNOSIS — N059 Unspecified nephritic syndrome with unspecified morphologic changes: Secondary | ICD-10-CM | POA: Diagnosis present

## 2011-01-09 DIAGNOSIS — Z794 Long term (current) use of insulin: Secondary | ICD-10-CM

## 2011-01-09 DIAGNOSIS — L03211 Cellulitis of face: Principal | ICD-10-CM | POA: Diagnosis present

## 2011-01-09 DIAGNOSIS — L0201 Cutaneous abscess of face: Principal | ICD-10-CM | POA: Diagnosis present

## 2011-01-09 LAB — DIFFERENTIAL
Basophils Absolute: 0.1 10*3/uL (ref 0.0–0.1)
Basophils Relative: 1 % (ref 0–1)
Eosinophils Absolute: 0.1 10*3/uL (ref 0.0–0.7)
Eosinophils Relative: 1 % (ref 0–5)
Lymphocytes Relative: 32 % (ref 12–46)
Lymphs Abs: 3.4 10*3/uL (ref 0.7–4.0)
Monocytes Absolute: 0.6 10*3/uL (ref 0.1–1.0)
Monocytes Relative: 6 % (ref 3–12)
Neutro Abs: 6.5 10*3/uL (ref 1.7–7.7)
Neutrophils Relative %: 61 % (ref 43–77)

## 2011-01-09 LAB — URINE MICROSCOPIC-ADD ON

## 2011-01-09 LAB — URINALYSIS, ROUTINE W REFLEX MICROSCOPIC
Bilirubin Urine: NEGATIVE
Glucose, UA: >1000 mg/dL — AB
Leukocytes, UA: NEGATIVE
Nitrite: NEGATIVE
Protein, ur: >300 mg/dL — AB
Specific Gravity, Urine: 1.02 (ref 1.005–1.030)
Urobilinogen, UA: 0.2 mg/dL (ref 0.0–1.0)
pH: 6.5 (ref 5.0–8.0)

## 2011-01-09 LAB — CBC
HCT: 39.5 % (ref 36.0–46.0)
Hemoglobin: 13 g/dL (ref 12.0–15.0)
MCH: 29.9 pg (ref 26.0–34.0)
MCHC: 32.9 g/dL (ref 30.0–36.0)
MCV: 90.8 fL (ref 78.0–100.0)
Platelets: 500 10*3/uL — ABNORMAL HIGH (ref 150–400)
RBC: 4.35 MIL/uL (ref 3.87–5.11)
RDW: 15.5 % (ref 11.5–15.5)
WBC: 10.7 10*3/uL — ABNORMAL HIGH (ref 4.0–10.5)

## 2011-01-09 LAB — BASIC METABOLIC PANEL
BUN: 12 mg/dL (ref 6–23)
CO2: 28 mEq/L (ref 19–32)
Calcium: 8.1 mg/dL — ABNORMAL LOW (ref 8.4–10.5)
Chloride: 100 mEq/L (ref 96–112)
Creatinine, Ser: 0.85 mg/dL (ref 0.50–1.10)
GFR calc Af Amer: >90 mL/min (ref >90)
GFR calc non Af Amer: 84 mL/min — ABNORMAL LOW (ref >90)
Glucose, Bld: 232 mg/dL — ABNORMAL HIGH (ref 70–99)
Potassium: 2.9 mEq/L — ABNORMAL LOW (ref 3.5–5.1)
Sodium: 135 mEq/L (ref 135–145)

## 2011-01-09 MED ORDER — SERTRALINE HCL 50 MG PO TABS
50.0000 mg | ORAL_TABLET | Freq: Every day | ORAL | Status: DC
  Administered 2011-01-10 – 2011-01-11 (×2): 50 mg via ORAL
  Filled 2011-01-09 (×2): qty 1

## 2011-01-09 MED ORDER — ENOXAPARIN SODIUM 40 MG/0.4ML ~~LOC~~ SOLN
40.0000 mg | SUBCUTANEOUS | Status: DC
  Administered 2011-01-10 – 2011-01-11 (×2): 40 mg via SUBCUTANEOUS
  Filled 2011-01-09 (×2): qty 0.4

## 2011-01-09 MED ORDER — CLINDAMYCIN PHOSPHATE 600 MG/50ML IV SOLN
600.0000 mg | Freq: Once | INTRAVENOUS | Status: AC
  Administered 2011-01-09: 600 mg via INTRAVENOUS

## 2011-01-09 MED ORDER — OMEGA-3-ACID ETHYL ESTERS 1 G PO CAPS
1.0000 g | ORAL_CAPSULE | Freq: Every day | ORAL | Status: DC
  Administered 2011-01-10 – 2011-01-11 (×2): 1 g via ORAL
  Filled 2011-01-09 (×2): qty 1

## 2011-01-09 MED ORDER — HYDROCODONE-ACETAMINOPHEN 5-325 MG PO TABS
2.0000 | ORAL_TABLET | Freq: Once | ORAL | Status: AC
  Administered 2011-01-09: 2 via ORAL
  Filled 2011-01-09: qty 2

## 2011-01-09 MED ORDER — MORPHINE SULFATE 4 MG/ML IJ SOLN
4.0000 mg | Freq: Once | INTRAMUSCULAR | Status: AC
  Administered 2011-01-09: 4 mg via INTRAVENOUS
  Filled 2011-01-09: qty 1

## 2011-01-09 MED ORDER — INSULIN ASPART 100 UNIT/ML ~~LOC~~ SOLN
5.0000 [IU] | Freq: Three times a day (TID) | SUBCUTANEOUS | Status: DC
  Administered 2011-01-10: 5 [IU] via SUBCUTANEOUS

## 2011-01-09 MED ORDER — ADULT MULTIVITAMIN W/MINERALS CH
1.0000 | ORAL_TABLET | Freq: Every day | ORAL | Status: DC
  Administered 2011-01-10 – 2011-01-11 (×2): 1 via ORAL
  Filled 2011-01-09 (×2): qty 1

## 2011-01-09 MED ORDER — DIPHENHYDRAMINE HCL 50 MG/ML IJ SOLN
50.0000 mg | Freq: Once | INTRAMUSCULAR | Status: AC
  Administered 2011-01-09: 50 mg via INTRAVENOUS
  Filled 2011-01-09: qty 1

## 2011-01-09 MED ORDER — POTASSIUM CHLORIDE CRYS ER 20 MEQ PO TBCR
40.0000 meq | EXTENDED_RELEASE_TABLET | Freq: Once | ORAL | Status: AC
  Administered 2011-01-09: 40 meq via ORAL
  Filled 2011-01-09: qty 2

## 2011-01-09 MED ORDER — IOHEXOL 300 MG/ML  SOLN
75.0000 mL | Freq: Once | INTRAMUSCULAR | Status: AC | PRN
  Administered 2011-01-09: 75 mL via INTRAVENOUS

## 2011-01-09 MED ORDER — SODIUM CHLORIDE 0.9 % IV SOLN
INTRAVENOUS | Status: DC
  Administered 2011-01-09 – 2011-01-10 (×3): via INTRAVENOUS

## 2011-01-09 MED ORDER — SODIUM CHLORIDE 0.9 % IV SOLN
INTRAVENOUS | Status: DC
  Administered 2011-01-09: 15:00:00 via INTRAVENOUS

## 2011-01-09 MED ORDER — INSULIN GLARGINE 100 UNIT/ML ~~LOC~~ SOLN
20.0000 [IU] | Freq: Every day | SUBCUTANEOUS | Status: DC
  Administered 2011-01-09: 20 [IU] via SUBCUTANEOUS
  Filled 2011-01-09: qty 3

## 2011-01-09 MED ORDER — CLINDAMYCIN PHOSPHATE 300 MG/50ML IV SOLN
600.0000 mg | Freq: Once | INTRAVENOUS | Status: DC
  Filled 2011-01-09: qty 100
  Filled 2011-01-09: qty 50

## 2011-01-09 MED ORDER — ALPRAZOLAM 1 MG PO TABS
1.0000 mg | ORAL_TABLET | Freq: Every day | ORAL | Status: DC | PRN
  Administered 2011-01-09 – 2011-01-10 (×2): 1 mg via ORAL
  Filled 2011-01-09 (×2): qty 1

## 2011-01-09 MED ORDER — INSULIN ASPART 100 UNIT/ML ~~LOC~~ SOLN
0.0000 [IU] | Freq: Three times a day (TID) | SUBCUTANEOUS | Status: DC
  Administered 2011-01-10: 15 [IU] via SUBCUTANEOUS
  Filled 2011-01-09: qty 3

## 2011-01-09 MED ORDER — FUROSEMIDE 20 MG PO TABS
20.0000 mg | ORAL_TABLET | ORAL | Status: DC
  Administered 2011-01-11: 20 mg via ORAL
  Filled 2011-01-09: qty 1

## 2011-01-09 MED ORDER — ACETAMINOPHEN 500 MG PO TABS
1000.0000 mg | ORAL_TABLET | Freq: Four times a day (QID) | ORAL | Status: DC | PRN
  Administered 2011-01-09: 1000 mg via ORAL
  Filled 2011-01-09: qty 2

## 2011-01-09 MED ORDER — VANCOMYCIN HCL IN DEXTROSE 1-5 GM/200ML-% IV SOLN
1000.0000 mg | Freq: Once | INTRAVENOUS | Status: AC
  Administered 2011-01-09: 1000 mg via INTRAVENOUS
  Filled 2011-01-09: qty 200

## 2011-01-09 MED ORDER — POTASSIUM CHLORIDE CRYS ER 20 MEQ PO TBCR
20.0000 meq | EXTENDED_RELEASE_TABLET | Freq: Every day | ORAL | Status: DC
  Administered 2011-01-10: 20 meq via ORAL
  Filled 2011-01-09: qty 1

## 2011-01-09 MED ORDER — METHYLPREDNISOLONE SODIUM SUCC 125 MG IJ SOLR
125.0000 mg | Freq: Once | INTRAMUSCULAR | Status: AC
  Administered 2011-01-09: 125 mg via INTRAVENOUS

## 2011-01-09 MED ORDER — VANCOMYCIN HCL IN DEXTROSE 1-5 GM/200ML-% IV SOLN
1000.0000 mg | INTRAVENOUS | Status: DC
  Administered 2011-01-10: 1000 mg via INTRAVENOUS
  Filled 2011-01-09: qty 200

## 2011-01-09 MED ORDER — FAMOTIDINE IN NACL 20-0.9 MG/50ML-% IV SOLN
20.0000 mg | Freq: Once | INTRAVENOUS | Status: AC
  Administered 2011-01-09: 20 mg via INTRAVENOUS
  Filled 2011-01-09: qty 50

## 2011-01-09 MED ORDER — ONDANSETRON HCL 4 MG/2ML IJ SOLN
4.0000 mg | Freq: Once | INTRAMUSCULAR | Status: AC
  Administered 2011-01-09: 4 mg via INTRAVENOUS
  Filled 2011-01-09: qty 2

## 2011-01-09 MED ORDER — CLINDAMYCIN PHOSPHATE 600 MG/50ML IV SOLN
INTRAVENOUS | Status: AC
  Administered 2011-01-09: 600 mg via INTRAVENOUS
  Filled 2011-01-09: qty 50

## 2011-01-09 MED ORDER — HYDROCODONE-ACETAMINOPHEN 5-325 MG PO TABS
1.0000 | ORAL_TABLET | Freq: Two times a day (BID) | ORAL | Status: DC | PRN
  Administered 2011-01-09 – 2011-01-10 (×3): 1 via ORAL
  Filled 2011-01-09 (×3): qty 1

## 2011-01-09 MED ORDER — DIPHENHYDRAMINE HCL 25 MG PO CAPS
25.0000 mg | ORAL_CAPSULE | Freq: Every evening | ORAL | Status: DC | PRN
  Administered 2011-01-09 – 2011-01-10 (×2): 25 mg via ORAL
  Filled 2011-01-09 (×2): qty 1

## 2011-01-09 MED ORDER — CLINDAMYCIN PHOSPHATE 600 MG/50ML IV SOLN
600.0000 mg | Freq: Three times a day (TID) | INTRAVENOUS | Status: DC
  Administered 2011-01-10 – 2011-01-11 (×4): 600 mg via INTRAVENOUS
  Filled 2011-01-09 (×11): qty 50

## 2011-01-09 MED ORDER — METHYLPREDNISOLONE SODIUM SUCC 125 MG IJ SOLR
125.0000 mg | Freq: Once | INTRAMUSCULAR | Status: DC
  Filled 2011-01-09: qty 2

## 2011-01-09 MED ORDER — LOSARTAN POTASSIUM 25 MG PO TABS
12.5000 mg | ORAL_TABLET | Freq: Every day | ORAL | Status: DC
  Administered 2011-01-10 – 2011-01-11 (×2): 12.5 mg via ORAL
  Filled 2011-01-09 (×4): qty 0.5

## 2011-01-09 NOTE — ED Provider Notes (Signed)
History     CSN: [id number]  Arrival date & time 01/09/11  1303   First MD Initiated Contact with Patient 01/09/11 1347      Chief Complaint  Patient presents with  . Facial Swelling    (Consider location/radiation/quality/duration/timing/severity/associated sxs/prior treatment) HPI  Patient relates she felt fine yesterday. She states yesterday evening she had some discomfort in the parotid area of her face and she put ice packs on it and went to bed. She states when she woke up she had swelling of her eyes, she states she has watering her eyes that is clear like water. She now has a headache that is throbbing in the frontal region. She also has noted some swelling under her throat and states she has pain under her mandible. She denies any fever, change in vision, swelling or pain of her legs, pain when she moves her eyes, or itching. She states the only thing different is last night she took some DayQuil. Most recently she has been suffering from shingles on her right back that radiates around onto her right lower chest wall. She relates Dr. Luan Pulling has put her on gabapentin and tramadol without relief of pain. She's never had this before.  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 Her next appointment is January 20. She states however with that she normally has swelling of her body without swelling of her face.  PCP Dr. Luan Pulling  Past Medical History  Diagnosis Date  . Diabetes mellitus   .  glomerulonephritis    . Shingles     Past Surgical History  Procedure Date  . Wrist surgery     left  . Tubal ligation     Family History  Problem Relation Age of Onset  . COPD Mother   . Hypertension Mother   . Diabetes Father   . Hypertension Father     History  Substance Use Topics  . Smoking status: Current Everyday Smoker -- 0.5 packs/day for 20 years    Types: Cigarettes  . Smokeless tobacco: Never Used  . Alcohol Use: No   patient  smokes marijuana  OB History    Grav Para Term Preterm Abortions TAB SAB Ect Mult Living   1 1  1      1       Review of Systems  All other systems reviewed and are negative.    Allergies  Aspirin and Ibuprofen  Home Medications   Current Outpatient Rx  Name Route Sig Dispense Refill  . ACETAMINOPHEN 500 MG PO TABS Oral Take 1,000 mg by mouth every 6 (six) hours as needed. For pain     . ALPRAZOLAM 1 MG PO TABS Oral Take 1 mg by mouth daily as needed. Anxiety    . DIPHENHYDRAMINE HCL 25 MG PO TABS Oral Take 25 mg by mouth at bedtime as needed. For sleep     . OMEGA-3 FATTY ACIDS 1000 MG PO CAPS Oral Take 1 g by mouth daily.     . FUROSEMIDE 20 MG PO TABS Oral Take 20 mg by mouth 3 (three) times a week. On Mondays, Wednesdays, and Fridays     . INSULIN ISOPHANE & REGULAR (70-30) 100 UNIT/ML [place] SUSP Subcutaneous Inject 12 Units into the skin 2 (two) times daily with a meal.      . INSULIN REGULAR HUMAN 100 UNIT/ML IJ SOLN Subcutaneous Inject 3-15 Units into the skin 3 (three) times daily as needed. Using according to sliding scale  at home.     Marland Kitchen LOSARTAN POTASSIUM 25 MG PO TABS Oral Take 12.5 mg by mouth daily.      Cre[place] M PLUS PO TABS Oral Take 1 tablet by mouth daily.      Marland Kitchen POTASSIUM CHLORIDE CRYS CR 20 MEQ PO TBCR Oral Take 0.5 tablets (10 mEq total) by mouth daily. 7 tablet 0  . SERTRALINE HCL 50 MG PO TABS Oral Take 50 mg by mouth daily.      Gabapentin 100 mg 4 times a day Ultracet 50 mg as needed for pain    BP 109/74  Pulse 108  Temp(Src) 97.8 F (36.6 C) (Oral)  Resp 18  Ht 5\' 1"  (1.549 m)  Wt 125 lb (56.7 kg)  BMI 23.62 kg/m2  SpO2 100%  LMP 12/07/2010  Vital signs shows tachycardia otherwise normal  Physical Exam  Nursing note and vitals reviewed. Constitutional: She is oriented to person, place, and time. She appears well-developed and well-nourished.  Non-toxic appearance. She does not appear ill. No distress.  HENT:  Head: Normocephalic.  Right  Ear: External ear normal.  Left Ear: External ear normal.  Nose: Nose normal. No mucosal edema or rhinorrhea.  Mouth/Throat: Oropharynx is clear and moist and mucous membranes are normal. No dental abscesses or uvula swelling.       Patient's noted to have diffuse puffiness of her face and appears to have some swelling in the submental region and the anterior neck. The skin is not firm to touch there is no redness seen  Eyes: Conjunctivae and EOM are normal. Pupils are equal, round, and reactive to light.       Patient is noted to have bilateral swelling of her eyelids with the right being more than the left. She has more redness of the eyelids on the right and almost appears to be getting a hordeleon on the right lower medially.  Neck: Normal range of motion and full passive range of motion without pain. Neck supple.  Cardiovascular: Normal rate, regular rhythm and normal heart sounds.  Exam reveals no gallop and no friction rub.   No murmur heard. Pulmonary/Chest: Effort normal and breath sounds normal. No respiratory distress. She has no wheezes. She has no rhonchi. She has no rales. She exhibits no tenderness and no crepitus.  Abdominal: Soft. Normal appearance and bowel sounds are normal. She exhibits no distension. There is no tenderness. There is no rebound and no guarding.  Musculoskeletal: Normal range of motion. She exhibits no edema and no tenderness.       Moves all extremities well.   Neurological: She is alert and oriented to person, place, and time. She has normal strength. No cranial nerve deficit.  Skin: Skin is warm, dry and intact. No rash noted. No erythema. No pallor.       When I examined patient's trunk she no longer has any shingles lesions. There is a faint hyperpigmentation of the skin where the lesions had been.  Psychiatric: She has a normal mood and affect. Her speech is normal and behavior is normal. Her mood appears not anxious.    ED Course  Procedures (including  critical care time)   Patient started on IV antibiotics, vancomycin and clindamycin IV. She was also given Benadryl, Solu-Medrol, and Pepcid IV for possible allergic reaction although she denies any itching.  Patient was started on oral potassium for her hypokalemia. She relates she's had it in the past.  Patient was given one dose IV morphine  for pain and then switched to oral hydrocodone for her pain from the post herpetic neuralgia  15:55 Dr Dorris Fetch will admit, gave nurse admitting orders  Results for orders placed during the hospital encounter of 01/09/11  CBC      Component Value Range   WBC 10.7 (*) 4.0 - 10.5 (K/uL)   RBC 4.35  3.87 - 5.11 (MIL/uL)   Hemoglobin 13.0  12.0 - 15.0 (g/dL)   HCT 39.5  36.0 - 46.0 (%)   MCV 90.8  78.0 - 100.0 (fL)   MCH 29.9  26.0 - 34.0 (pg)   MCHC 32.9  30.0 - 36.0 (g/dL)   RDW 15.5  11.5 - 15.5 (%)   Platelets 500 (*) 150 - 400 (K/uL)  DIFFERENTIAL      Component Value Range   Neutrophils Relative 61  43 - 77 (%)   Neutro Abs 6.5  1.7 - 7.7 (K/uL)   Lymphocytes Relative 32  12 - 46 (%)   Lymphs Abs 3.4  0.7 - 4.0 (K/uL)   Monocytes Relative 6  3 - 12 (%)   Monocytes Absolute 0.6  0.1 - 1.0 (K/uL)   Eosinophils Relative 1  0 - 5 (%)   Eosinophils Absolute 0.1  0.0 - 0.7 (K/uL)   Basophils Relative 1  0 - 1 (%)   Basophils Absolute 0.1  0.0 - 0.1 (K/uL)  BASIC METABOLIC PANEL      Component Value Range   Sodium 135  135 - 145 (mEq/L)   Potassium 2.9 (*) 3.5 - 5.1 (mEq/L)   Chloride 100  96 - 112 (mEq/L)   CO2 28  19 - 32 (mEq/L)   Glucose, Bld 232 (*) 70 - 99 (mg/dL)   BUN 12  6 - 23 (mg/dL)   Creatinine, Ser 0.85  0.50 - 1.10 (mg/dL)   Calcium 8.1 (*) 8.4 - 10.5 (mg/dL)   GFR calc non Af Amer 84 (*) >90 (mL/min)   GFR calc Af Amer >90  >90 (mL/min)  URINALYSIS, ROUTINE W REFLEX MICROSCOPIC      Component Value Range   Color, Urine YELLOW  YELLOW    APPearance CLOUDY (*) CLEAR    Specific Gravity, Urine 1.020  1.005 - 1.030    pH  6.5  5.0 - 8.0    Glucose, UA >1000 (*) NEGATIVE (mg/dL)   Hgb urine dipstick SMALL (*) NEGATIVE    Bilirubin Urine NEGATIVE  NEGATIVE    Ketones, ur TRACE (*) NEGATIVE (mg/dL)   Protein, ur >300 (*) NEGATIVE (mg/dL)   Urobilinogen, UA 0.2  0.0 - 1.0 (mg/dL)   Nitrite NEGATIVE  NEGATIVE    Leukocytes, UA NEGATIVE  NEGATIVE   URINE MICROSCOPIC-ADD ON      Component Value Range   Squamous Epithelial / LPF MANY (*) RARE    WBC, UA 7-10  <3 (WBC/hpf)   RBC / HPF 7-10  <3 (RBC/hpf)   Bacteria, UA MANY (*) RARE    Laboratory interpretation mild leukocytosis, hyperglycemia, hypokalemia, contaminated urinalysis otherwise normal   Ct Maxillofacial W/cm  01/09/2011  *RADIOLOGY REPORT*  Clinical Data: Facial swelling, particularly in the orbital area.  CT MAXILLOFACIAL WITH CONTRAST  Technique:  Multidetector CT imaging of the maxillofacial structures was performed with intravenous contrast. Multiplanar CT image reconstructions were also generated.  Contrast: 11mL OMNIPAQUE IOHEXOL 300 MG/ML IV SOLN  Comparison: None.  Findings: Mild stranding within the subcutaneous soft tissues over the face and orbits, suggesting mild cellulitis.  Recommend clinical correlation.  No focal fluid collections to suggest abscesses.  Small scattered upper cervical lymph nodes, none pathologically enlarged.  Orbital soft tissues are unremarkable. Paranasal sinuses are clear.  No bony abnormality.  IMPRESSION: Mild subcutaneous edema/stranding suggesting the possibility of cellulitis.  Recommend clinical correlation.  No focal abscess.  Original Report Authenticated By: Raelyn Number, M.D.    Diagnoses that have been ruled out:  Diagnoses that are still under consideration:  Final diagnoses:  Facial cellulitis  Hypokalemia  Post herpetic neuralgia  Hyperglycemia   Plan admission   MDM    it is not clear by exam whether she's having early cellulitis or any allergic reaction although she has no itching to be  suggestive of an allergic reaction. She also does not have fever which she would expect with cellulitis.    Rolland Porter, MD, FACEP        Janice Norrie, MD 01/09/11 1800

## 2011-01-09 NOTE — ED Notes (Addendum)
Pt states that she woke up this am with her face swollen. States that she was diagnosed with the shingles the end of November. Pt states that she took Vicks Dayquil yesterday but didn't know that it had acetaminophen.

## 2011-01-09 NOTE — ED Notes (Signed)
Attempted to call report, was told the nurse would have to call me back.

## 2011-01-09 NOTE — ED Notes (Signed)
Patient is eating and is comfortable. Does not need anything at this time.

## 2011-01-09 NOTE — ED Notes (Signed)
Pt requesting something for pain.  edp notified.  No new orders received.

## 2011-01-09 NOTE — ED Notes (Signed)
Patient would like something to drink. RN Benjamine Mola notified.

## 2011-01-09 NOTE — H&P (Signed)
  [id number] 

## 2011-01-09 NOTE — H&P (Signed)
[name], [name]                ACCOUNT NO.:  [id number]  MEDICAL RECORD NO.:  [id number]  LOCATION:  [facility]                         FACILITY:  [facility]  PHYSICIAN:  [name], MD DATE OF BIRTH:  [birth year]  DATE OF ADMISSION:  01/09/2011 DATE OF DISCHARGE:  LH                             HISTORY & PHYSICAL   CHIEF COMPLAINT:  Facial and periorbital swelling.  HISTORY OF PRESENT ILLNESS:  This is a 40 year old female patient with medical history significant for type 2 diabetes, glomerular nephritis, herpes Zoster.  She presents to the emergency room complaining that she had swelling to her face and periorbital area bilaterally with some discomfort in opening her eyes and moving her eyes from side to side. The patient tried to help herself with ice packs, however, this morning she woke up with tearing eyes associated with some throbbing headache to the frontal region.  She denied any fever.  No trauma.  No cough.  She has no sick contacts.  However, she has been recovering from herpes Zoster to the right  midthoracic region for which she was given therapy with Neurontin.  Of note, the patient has history of  unidentified glomerular nephritis for which she follows at [place]:  Type 2 diabetes, herpes Zoster, and glomerular nephritis.  PAST SURGICAL HISTORY:  Rib surgery and tubal ligation.  FAMILY HISTORY:  COPD in her mother, hypertension in her mother. Diabetes in her father, hypertension in her father.  SOCIAL HISTORY:  Smoking, active current smoking of half pack per day for at least 20 years.  Alcohol use none.  Drug use significant for marijuana.  REVIEW OF SYSTEMS:  As in HPI.  All others reviewed and negative.  ALLERGIES:  ASPIRIN and IBUPROFEN.  HOME MEDICATIONS:  Acetaminophen, alprazolam, diphenhydramine, omega-3 fatty acids, furosemide 20 mg p.o. daily, NPH 12 units b.i.d., regular insulin 3-15 units 3 times with meals,  losartan 25 mg daily, potassium chloride 20 mEq daily, sertraline 50 mg daily, gabapentin 100 mg 4 times a day, Ultracet 50 mg as needed for pain.  PHYSICAL EXAMINATION:  GENERAL:  She was alert and oriented x3, not in acute distress. VITAL SIGNS:  Blood pressure 102/54, pulse rate 74, temperature 97.8. HEENT:  Hyperemic slightly swollen now appear to be wrinkled down periorbital tissue bilaterally with clear conjunctivae and no tenderness in her sinuses.  Atraumatic head. NECK:  Free of lymphadenopathy. CHEST:  Clear to auscultation bilaterally. CARDIOVASCULAR:  Normal S1 and S2.  No murmur.  No gallop.  ABDOMEN: Soft and nontender. EXTREMITIES:  No edema. CNS:  Nonfocal. SKIN:  Has healing herpes Zoster rash on the right mid thoracic region.  LAB WORK:  Sodium 135, potassium 2.9, chloride 100, bicarb 28, BUN 12, creatinine 0.85, calcium 8.1, glucose 232.  WBC is 10.7, hemoglobin 13, platelets 500.  Urinalysis showed many bacteria, many squamous epithelium.  CT of her maxillofacial showed mild subcutaneous edema/stranding suggesting the possibility of cellulitis.  No fluid collection to suggest abscess.  Small scattered upper cervical lymph nodes, none pathologically relevant.  Vital soft tissues are unremarkable.  Paranasal sinuses are clear.  No bony abnormalities.  ASSESSMENT: 1. Facial cellulitis.  2. Type 2 diabetes. 3. Hypokalemia.  PLAN:  We will admit patient for IV antibiotics including vancomycin and clindamycin.  We will supplement her potassium.  We will control pain. We will not proceed with the steroids.  I will stopped her NPH and regular insulin and provide Lantus and NovoLog as in orders.  Her care will be resumed by Dr. Luan Pulling, in the morning.                                           ______________________________ [name], MD     GN/MEDQ  D:  01/09/2011  T:  01/09/2011  Job:  [id number]

## 2011-01-10 LAB — BASIC METABOLIC PANEL
BUN: 18 mg/dL (ref 6–23)
CO2: 23 mEq/L (ref 19–32)
Calcium: 7.8 mg/dL — ABNORMAL LOW (ref 8.4–10.5)
Chloride: 92 mEq/L — ABNORMAL LOW (ref 96–112)
Creatinine, Ser: 1.21 mg/dL — ABNORMAL HIGH (ref 0.50–1.10)
GFR calc Af Amer: 63 mL/min — ABNORMAL LOW (ref >90)
GFR calc non Af Amer: 55 mL/min — ABNORMAL LOW (ref >90)
Glucose, Bld: 631 mg/dL (ref 70–99)
Potassium: 4.6 mEq/L (ref 3.5–5.1)
Sodium: 125 mEq/L — ABNORMAL LOW (ref 135–145)

## 2011-01-10 LAB — GLUCOSE, RANDOM
Glucose, Bld: 622 mg/dL (ref 70–99)
Glucose, Bld: 308 mg/dL — ABNORMAL HIGH (ref 70–99)

## 2011-01-10 LAB — GLUCOSE, CAPILLARY
Glucose-Capillary: 547 mg/dL — ABNORMAL HIGH (ref 70–99)
Glucose-Capillary: 500 mg/dL — ABNORMAL HIGH (ref 70–99)
Glucose-Capillary: 117 mg/dL — ABNORMAL HIGH (ref 70–99)

## 2011-01-10 MED ORDER — HYDROCODONE-ACETAMINOPHEN 5-325 MG PO TABS: 1.0000 | ORAL_TABLET | Freq: Three times a day (TID) | ORAL | Status: DC | PRN

## 2011-01-10 MED ORDER — POTASSIUM CHLORIDE CRYS ER 20 MEQ PO TBCR
40.0000 meq | EXTENDED_RELEASE_TABLET | Freq: Two times a day (BID) | ORAL | Status: DC
  Administered 2011-01-10: 20 meq via ORAL
  Administered 2011-01-10 – 2011-01-11 (×2): 40 meq via ORAL
  Filled 2011-01-10: qty 1
  Filled 2011-01-10 (×2): qty 2

## 2011-01-10 MED ORDER — INSULIN ASPART 100 UNIT/ML ~~LOC~~ SOLN
10.0000 [IU] | Freq: Three times a day (TID) | SUBCUTANEOUS | Status: DC
  Administered 2011-01-10 – 2011-01-11 (×3): 10 [IU] via SUBCUTANEOUS
  Filled 2011-01-10: qty 3

## 2011-01-10 MED ORDER — CLINDAMYCIN PHOSPHATE 600 MG/50ML IV SOLN
INTRAVENOUS | Status: AC
  Filled 2011-01-10: qty 50

## 2011-01-10 MED ORDER — INSULIN ASPART 100 UNIT/ML ~~LOC~~ SOLN
0.0000 [IU] | Freq: Three times a day (TID) | SUBCUTANEOUS | Status: DC
  Administered 2011-01-10: 20 [IU] via SUBCUTANEOUS

## 2011-01-10 MED ORDER — INSULIN ASPART 100 UNIT/ML ~~LOC~~ SOLN
0.0000 [IU] | Freq: Three times a day (TID) | SUBCUTANEOUS | Status: DC
  Administered 2011-01-11: 5 [IU] via SUBCUTANEOUS
  Administered 2011-01-11: 9 [IU] via SUBCUTANEOUS

## 2011-01-10 MED ORDER — INSULIN GLARGINE 100 UNIT/ML ~~LOC~~ SOLN: 40.0000 [IU] | Freq: Every day | SUBCUTANEOUS | Status: DC

## 2011-01-10 MED ORDER — INSULIN GLARGINE 100 UNIT/ML ~~LOC~~ SOLN: 30.0000 [IU] | Freq: Every day | SUBCUTANEOUS | Status: DC

## 2011-01-10 MED ORDER — INSULIN ASPART 100 UNIT/ML ~~LOC~~ SOLN
15.0000 [IU] | Freq: Three times a day (TID) | SUBCUTANEOUS | Status: DC
  Administered 2011-01-10: 15 [IU] via SUBCUTANEOUS

## 2011-01-10 NOTE — Consult Note (Signed)
[name], [name]                ACCOUNT NO.:  [id number]  MEDICAL RECORD NO.:  [id number]  LOCATION:  [facility]                         FACILITY:  [facility]  PHYSICIAN:  [name], MD DATE OF BIRTH:  Jun 06, 1970  DATE OF CONSULTATION:  01/10/2011 DATE OF DISCHARGE:                                CONSULTATION   REASON FOR CONSULT:  Uncontrolled type 1 diabetes.  HISTORY OF PRESENT ILLNESS:  This is a 40 year old female patient whom I have admitted for Dr. Luan Pulling yesterday for facial cellulitis.  She has a significant medical history of type 1 diabetes, glomerulonephritis, and recent herpes zoster rash.  She was admitted with facial cellulitis. She was found to have significantly elevated hyperglycemia which was worsened by a dose of Solu-Medrol given in the emergency department for her initial presenting symptoms.  She gives history of type 1 diabetes since age 40.  She reports that her diabetes had never been well controlled.  She was given NPH 12 units b.i.d. and regular insulin 3-15 units 3 times a day with meals.  Her most recent A1c was from March 2012, at which time it was 11%, prior to that on March 2011, it was 9.8%.  The patient is willing to engage in intensive therapy for her diabetes.  PAST MEDICAL HISTORY: 1. Type 1 diabetes since age 41. 2. Glomerulonephritis. 3. Herpes zoster. 4. Facial cellulitis.  PAST SURGICAL HISTORY:  Tubal ligation and rib surgery.  FAMILY HISTORY:  Significant for type 1 and type 2 diabetes in multiple family members, COPD, hypertension.  SOCIAL HISTORY:  Active current smoking, did so for at least 20 years. No alcohol use.  Drug use significant for marijuana.  REVIEW OF SYSTEMS:  As in HPI.  All others are reviewed and negative.  ALLERGIES:  She is allergic to ASPIRIN and IBUPROFEN.  HOME MEDICATIONS:  Acetaminophen, alprazolam, diphenhydramine, omega-3 fatty acids, furosemide, NPH 12 units b.i.d., regular insulin 3-15  units t.i.d. before meals, losartan 25 mg daily, potassium chloride 20 mEq daily, sertraline 60 mg daily, gabapentin 100 mg 4 times a day, Ultracet 50 mg as needed for pain.  PHYSICAL EXAMINATION:  GENERAL:  She is alert and oriented x3, not in acute distress, resting in hospital bed. VITAL SIGNS:  Her blood pressure is 193/66, pulse rate 100, temperature 98.1. HEENT:  Moist mucous membranes.  Her periorbital swelling and hyperemia is clearing.  No goiter, [name]. CARDIOVASCULAR:  Normal S1, S2.  No murmur.  No gallop. ABDOMEN:  Soft and nontender. CHEST:  Clear to auscultation bilaterally. EXTREMITIES:  No edema. CNS:  Nonfocal. SKIN:  No rash.  No hyperemia.  LABORATORY DATA:  From earlier today showed glucose significantly elevated at 631, associated with sodium of 125, potassium 4.6, chloride 92, bicarb 23, BUN 18, creatinine 1.2.  Her blood glucose subsequently improved to 117 by elevated mealtime coverage with more NovoLog up to 20 units.  ASSESSMENT: 1. Type 1 diabetes chronically uncontrolled with last A1c at 11%. 2. Facial cellulitis. 3. Depression. 4. Hyponatremia. 5. Hypertension.  PLAN:  The patient's brisk hyperglycemia is due to a dose of Solu-Medrol given at the emergency department when she presented with periorbital swelling and  hyperemia.  She has responded favorably to increased mealtime coverage with NovoLog as high as 20 units.  Her glucose is at 117 mg/dL.  I will increase her glargine to 30 units at bedtime, and increase her NovoLog to 10 units t.i.d. before meals in addition to sensitive dose sliding scale.  We will repeat her A1c while she is in hospital, and she agrees to see me in the clinic for diabetes followup. I have counseled the patient to engage in aggressive basal bolus insulin therapy to prevent further complications including retinopathy, nephropathy, CVA, coronary artery disease, and peripheral arterial disease.  She voices understanding  and plans to see me in the clinic in 1-2 weeks time after discharge.  I will continue to see her in hospital along with Dr. Luan Pulling while she is being treated for facial cellulitis.  Dr. Luan Pulling, thank you for the opportunity to participate in the care of this pleasant patient.          ______________________________ [name], MD     GN/MEDQ  D:  01/10/2011  T:  01/10/2011  Job:  [id number]

## 2011-01-10 NOTE — Progress Notes (Signed)
Pt's CBG was 547 and stat glucose pending.  Writer made Dr. Luan Pulling aware and received new orders and followed.  Pt stated that she had drank 5 juices this morning and 2 orange juices last night.  Writer educated on what to drink and and not to related to diabetes.  Verbalized understanding.  Dr. Luan Pulling made aware as well.

## 2011-01-10 NOTE — Progress Notes (Signed)
Subjective: She was admitted with what appears to be cellulitis of the face. She is diabetic.  Objective: Vital signs in last 24 hours: Temp:  [97.4 F (36.3 C)-97.8 F (36.6 C)] 97.4 F (36.3 C) (12/23 0528) Pulse Rate:  [72-108] 72  (12/23 0528) Resp:  [18] 18  (12/23 0528) BP: (102-120)/(54-74) 109/69 mmHg (12/23 0528) SpO2:  [97 %-100 %] 97 % (12/23 0528) Weight:  [56.7 kg (125 lb)-60.7 kg (133 lb 13.1 oz)] 133 lb 13.1 oz (60.7 kg) (12/22 2201) Weight change:  Last BM Date: 01/09/11  Intake/Output from previous day: 12/22 0701 - 12/23 0700 In: 856.3 [P.O.:480; I.V.:376.3] Out: -   PHYSICAL EXAM General appearance: alert, cooperative and mild distress Resp: clear to auscultation bilaterally Cardio: regular rate and rhythm, S1, S2 normal, no murmur, click, rub or gallop GI: soft, non-tender; bowel sounds normal; no masses,  no organomegaly Extremities: extremities normal, atraumatic, no cyanosis or edema  Lab Results:    Basic Metabolic Panel:  Basename 01/10/11 0808 01/09/11 1500  NA -- 135  K -- 2.9*  CL -- 100  CO2 -- 28  GLUCOSE 622* 232*  BUN -- 12  CREATININE -- 0.85  CALCIUM -- 8.1*  MG -- --  PHOS -- --   Liver Function Tests: No results found for this basename: AST:2,ALT:2,ALKPHOS:2,BILITOT:2,PROT:2,ALBUMIN:2 in the last 72 hours No results found for this basename: LIPASE:2,AMYLASE:2 in the last 72 hours No results found for this basename: AMMONIA:2 in the last 72 hours CBC:  Basename 01/09/11 1500  WBC 10.7*  NEUTROABS 6.5  HGB 13.0  HCT 39.5  MCV 90.8  PLT 500*   Cardiac Enzymes: No results found for this basename: CKTOTAL:3,CKMB:3,CKMBINDEX:3,TROPONINI:3 in the last 72 hours BNP: No components found with this basename: POCBNP:3 D-Dimer: No results found for this basename: DDIMER:2 in the last 72 hours CBG:  Basename 01/10/11 0747  GLUCAP 547*   Hemoglobin A1C: No results found for this basename: HGBA1C in the last 72  hours Fasting Lipid Panel: No results found for this basename: CHOL,HDL,LDLCALC,TRIG,CHOLHDL,LDLDIRECT in the last 72 hours Thyroid Function Tests: No results found for this basename: TSH,T4TOTAL,FREET4,T3FREE,THYROIDAB in the last 72 hours Anemia Panel: No results found for this basename: VITAMINB12,FOLATE,FERRITIN,TIBC,IRON,RETICCTPCT in the last 72 hours Coagulation: No results found for this basename: LABPROT:2,INR:2 in the last 72 hours Urine Drug Screen: Drugs of Abuse     Component Value Date/Time   LABOPIA NONE DETECTED 03/21/2010 2130   [place]* 03/21/2010 2130   [place] 03/21/2010 2130   AMPHETMU NONE DETECTED 03/21/2010 2130   THCU NONE DETECTED 03/21/2010 2130   LABBARB  Value: NONE DETECTED        DRUG SCREEN FOR MEDICAL PURPOSES ONLY.  IF CONFIRMATION IS NEEDED FOR ANY PURPOSE, NOTIFY LAB WITHIN 5 DAYS.        LOWEST DETECTABLE LIMITS FOR URINE DRUG SCREEN Drug Class       Cutoff (ng/mL) Amphetamine      1000 Barbiturate      200 Benzodiazepine   [id number] Tricyclics       [id number] Opiates          300 Cocaine          300 THC              50 03/21/2010 2130    Alcohol Level: No results found for this basename: ETH:2 in the last 72 hours Urinalysis:  Misc. Labs:  ABGS No results found for this basename: PHART,PCO2,PO2ART,TCO2,HCO3 in the last  72 hours CULTURES No results found for this or any previous visit (from the past 240 hour(s)). Studies/Results: Ct Maxillofacial W/cm  01/09/2011  *RADIOLOGY REPORT*  Clinical Data: Facial swelling, particularly in the orbital area.  CT MAXILLOFACIAL WITH CONTRAST  Technique:  Multidetector CT imaging of the maxillofacial structures was performed with intravenous contrast. Multiplanar CT image reconstructions were also generated.  Contrast: 35mL OMNIPAQUE IOHEXOL 300 MG/ML IV SOLN  Comparison: None.  Findings: Mild stranding within the subcutaneous soft tissues over the face and orbits, suggesting mild cellulitis.  Recommend  clinical correlation.  No focal fluid collections to suggest abscesses.  Small scattered upper cervical lymph nodes, none pathologically enlarged.  Orbital soft tissues are unremarkable. Paranasal sinuses are clear.  No bony abnormality.  IMPRESSION: Mild subcutaneous edema/stranding suggesting the possibility of cellulitis.  Recommend clinical correlation.  No focal abscess.  Original Report Authenticated By: Raelyn Number, M.D.    Medications:  Prior to Admission:  Prescriptions prior to admission  Medication Sig Dispense Refill  . acetaminophen (TYLENOL) 500 MG tablet Take 1,000 mg by mouth every 6 (six) hours as needed. For pain       . ALPRAZolam (XANAX) 1 MG tablet Take 1 mg by mouth daily as needed. Anxiety      . diphenhydrAMINE (BENADRYL) 25 MG tablet Take 25 mg by mouth at bedtime as needed. For sleep      . fish oil-omega-3 fatty acids 1000 MG capsule Take 1 g by mouth daily.       . furosemide (LASIX) 20 MG tablet Take 20 mg by mouth 3 (three) times a week. On Mondays, Wednesdays, and Fridays       . insulin NPH-insulin regular (NOVOLIN 70/30) (70-30) 100 UNIT/ML injection Inject 12 Units into the skin 2 (two) times daily with a meal.        . insulin regular (HUMULIN R) 100 units/mL injection Inject 3-15 Units into the skin 3 (three) times daily as needed. Using according to sliding scale at home.       . losartan (COZAAR) 25 MG tablet Take 12.5 mg by mouth daily.        . Multiple Vitamins-Minerals (MULTIVITAMINS THER. W/MINERALS) TABS Take 1 tablet by mouth daily.        . potassium chloride SA (K-DUR,KLOR-CON) 20 MEQ tablet Take 0.5 tablets (10 mEq total) by mouth daily.  7 tablet  0  . sertraline (ZOLOFT) 50 MG tablet Take 50 mg by mouth daily.         Scheduled:   . clindamycin (CLEOCIN) IV  600 mg Intravenous Once  . clindamycin (CLEOCIN) IV  600 mg Intravenous Q8H  . diphenhydrAMINE  50 mg Intravenous Once  . enoxaparin (LOVENOX) injection  40 mg Subcutaneous Q24H  .  famotidine  20 mg Intravenous Once  . furosemide  20 mg Oral 3 times weekly  . HYDROcodone-acetaminophen  2 tablet Oral Once  . insulin aspart  0-15 Units Subcutaneous TID WC  . insulin aspart  5 Units Subcutaneous TID AC  . insulin glargine  20 Units Subcutaneous QHS  . losartan  12.5 mg Oral Daily  . methylPREDNISolone sodium succinate  125 mg Intravenous Once  .  morphine injection  4 mg Intravenous Once  . mulitivitamin with minerals  1 tablet Oral Daily  . omega-3 acid ethyl esters  1 g Oral Daily  . ondansetron (ZOFRAN) IV  4 mg Intravenous Once  . potassium chloride  20 mEq Oral Daily  .  potassium chloride SA  40 mEq Oral Once  . sertraline  50 mg Oral Daily  . vancomycin  1,000 mg Intravenous Once  . vancomycin  1,000 mg Intravenous Q24H  . DISCONTD: clindamycin  600 mg Intravenous Once  . DISCONTD: methylPREDNISolone sodium succinate  125 mg Intramuscular Once   Continuous:   . sodium chloride 75 mL/hr at 01/10/11 0431  . DISCONTD: sodium chloride 100 mL/hr at 01/09/11 1443   [id number], ALPRAZolam, diphenhydrAMINE, HYDROcodone-acetaminophen, iohexol  Assesment: She has what appears to be cellulitis of the face. She has diabetes. She has glomerulonephritis. Active Problems:  * No active hospital problems. *     Plan: Continue with her antibiotics. I think she's a little bit better but I didn't see her yesterday so I can't tell for sure. Because her blood sugar is uncontrolled I am going to ask for endocrinology consultation. She may need a PICC line to take IV antibiotics at home    LOS: 1 day   Tenleigh Byer L 01/10/2011, 10:06 AM

## 2011-01-10 NOTE — Progress Notes (Signed)
Pt's CBG is 500 at this time, Dr. Dorris Fetch called at this time and writer made aware of CBG 500.  Received new orders and followed.

## 2011-01-10 NOTE — Progress Notes (Signed)
PT'S CBG WAS 117 AND PT WAS DUE TO GET 15 UNITS NOVOLOG.  CALLED DR. NIDA TO MAKE AWARE AND RECEIVED NEW ORDERS AND FOLLOWED.  PT MADE AWARE OF NEW ORDERS AND EDUCATED ON HYPOGLYCEMIA SYMPTOMS.

## 2011-01-10 NOTE — Consult Note (Signed)
  [id number] 

## 2011-01-11 ENCOUNTER — Encounter (HOSPITAL_COMMUNITY): Payer: Medicaid Other

## 2011-01-11 DIAGNOSIS — L0201 Cutaneous abscess of face: Principal | ICD-10-CM | POA: Insufficient documentation

## 2011-01-11 DIAGNOSIS — IMO0001 Reserved for inherently not codable concepts without codable children: Secondary | ICD-10-CM | POA: Diagnosis present

## 2011-01-11 DIAGNOSIS — N059 Unspecified nephritic syndrome with unspecified morphologic changes: Secondary | ICD-10-CM | POA: Diagnosis present

## 2011-01-11 LAB — GLUCOSE, CAPILLARY
Glucose-Capillary: 288 mg/dL — ABNORMAL HIGH (ref 70–99)
Glucose-Capillary: 369 mg/dL — ABNORMAL HIGH (ref 70–99)

## 2011-01-11 LAB — BASIC METABOLIC PANEL
BUN: 21 mg/dL (ref 6–23)
CO2: 22 mEq/L (ref 19–32)
Calcium: 7.4 mg/dL — ABNORMAL LOW (ref 8.4–10.5)
Chloride: 104 mEq/L (ref 96–112)
Creatinine, Ser: 1.03 mg/dL (ref 0.50–1.10)
GFR calc Af Amer: 77 mL/min — ABNORMAL LOW (ref >90)
GFR calc non Af Amer: 67 mL/min — ABNORMAL LOW (ref >90)
Glucose, Bld: 302 mg/dL — ABNORMAL HIGH (ref 70–99)
Potassium: 4.8 mEq/L (ref 3.5–5.1)
Sodium: 133 mEq/L — ABNORMAL LOW (ref 135–145)

## 2011-01-11 MED ORDER — CLINDAMYCIN PHOSPHATE 600 MG/50ML IV SOLN
600.0000 mg | INTRAVENOUS | Status: DC
  Filled 2011-01-11 (×2): qty 50

## 2011-01-11 MED ORDER — CLINDAMYCIN PHOSPHATE 600 MG/50ML IV SOLN: 600.0000 mg | Freq: Three times a day (TID) | INTRAVENOUS | Status: DC

## 2011-01-11 MED ORDER — INSULIN ASPART 100 UNIT/ML ~~LOC~~ SOLN: 10.0000 [IU] | Freq: Three times a day (TID) | SUBCUTANEOUS | Status: DC

## 2011-01-11 MED ORDER — VANCOMYCIN HCL 1000 MG IV SOLR
1250.0000 mg | Freq: Once | INTRAVENOUS | Status: DC
  Filled 2011-01-11: qty 1250

## 2011-01-11 MED ORDER — DOXYCYCLINE HYCLATE 50 MG PO CAPS: 100.0000 mg | ORAL_CAPSULE | Freq: Two times a day (BID) | ORAL | Status: AC

## 2011-01-11 MED ORDER — VANCOMYCIN HCL 1000 MG IV SOLR
1250.0000 mg | INTRAVENOUS | Status: DC
  Filled 2011-01-11 (×2): qty 1250

## 2011-01-11 MED ORDER — INSULIN GLARGINE 100 UNIT/ML ~~LOC~~ SOLN: 30.0000 [IU] | Freq: Every day | SUBCUTANEOUS | Status: DC

## 2011-01-11 MED ORDER — HYDROCODONE-ACETAMINOPHEN 5-325 MG PO TABS: 1.0000 | ORAL_TABLET | Freq: Three times a day (TID) | ORAL | Status: DC | PRN

## 2011-01-11 MED ORDER — CLINDAMYCIN PHOSPHATE 300 MG/50ML IV SOLN
300.0000 mg | Freq: Once | INTRAVENOUS | Status: DC
  Filled 2011-01-11: qty 50

## 2011-01-11 MED ORDER — VANCOMYCIN HCL IN DEXTROSE 1-5 GM/200ML-% IV SOLN: 1000.0000 mg | INTRAVENOUS | Status: DC

## 2011-01-11 NOTE — Progress Notes (Signed)
Attempted picc both arms without success. Poor veins impossible to thread. Patient understood but very upset.  Notified dr Luan Pulling of situation . No orders.

## 2011-01-11 NOTE — Progress Notes (Signed)
Subjective: She has been admitted with cellulitis of the face and poorly controlled diabetes. She looks better. She probably needs IV antibiotics for a period of approximately 7 days. I'm going to see if I can get a PICC line placed so that we can accomplish that.  Objective: Vital signs in last 24 hours: Temp:  [97.9 F (36.6 C)-98.1 F (36.7 C)] 97.9 F (36.6 C) (12/24 0615) Pulse Rate:  [71-109] 71  (12/24 0615) Resp:  [18] 18  (12/24 0615) BP: (93-111)/(64-72) 102/64 mmHg (12/24 0615) SpO2:  [98 %-100 %] 100 % (12/24 0615) Weight change:  Last BM Date: 01/09/11  Intake/Output from previous day: 12/23 0701 - 12/24 0700 In: 480 [P.O.:480] Out: -   PHYSICAL EXAM General appearance: alert, cooperative and no distress Resp: clear to auscultation bilaterally Cardio: regular rate and rhythm, S1, S2 normal, no murmur, click, rub or gallop GI: soft, non-tender; bowel sounds normal; no masses,  no organomegaly Extremities: extremities normal, atraumatic, no cyanosis or edema  Lab Results:    Basic Metabolic Panel:  Basename 01/11/11 0545 01/10/11 1405 01/10/11 0808  NA 133* -- 125*  K 4.8 -- 4.6  CL 104 -- 92*  CO2 22 -- 23  GLUCOSE 302* 308* --  BUN 21 -- 18  CREATININE 1.03 -- 1.21*  CALCIUM 7.4* -- 7.8*  MG -- -- --  PHOS -- -- --   Liver Function Tests: No results found for this basename: AST:2,ALT:2,ALKPHOS:2,BILITOT:2,PROT:2,ALBUMIN:2 in the last 72 hours No results found for this basename: LIPASE:2,AMYLASE:2 in the last 72 hours No results found for this basename: AMMONIA:2 in the last 72 hours CBC:  Basename 01/09/11 1500  WBC 10.7*  NEUTROABS 6.5  HGB 13.0  HCT 39.5  MCV 90.8  PLT 500*   Cardiac Enzymes: No results found for this basename: CKTOTAL:3,CKMB:3,CKMBINDEX:3,TROPONINI:3 in the last 72 hours BNP: No results found for this basename: PROBNP:3 in the last 72 hours D-Dimer: No results found for this basename: DDIMER:2 in the last 72  hours CBG:  Basename 01/11/11 0719 01/10/11 1705 01/10/11 1108 01/10/11 0747  GLUCAP 288* 117* 500* 547*   Hemoglobin A1C: No results found for this basename: HGBA1C in the last 72 hours Fasting Lipid Panel: No results found for this basename: CHOL,HDL,LDLCALC,TRIG,CHOLHDL,LDLDIRECT in the last 72 hours Thyroid Function Tests: No results found for this basename: TSH,T4TOTAL,FREET4,T3FREE,THYROIDAB in the last 72 hours Anemia Panel: No results found for this basename: VITAMINB12,FOLATE,FERRITIN,TIBC,IRON,RETICCTPCT in the last 72 hours Coagulation: No results found for this basename: LABPROT:2,INR:2 in the last 72 hours Urine Drug Screen: Drugs of Abuse     Component Value Date/Time   LABOPIA NONE DETECTED 03/21/2010 2130   [place]* 03/21/2010 2130   [place] 03/21/2010 2130   AMPHETMU NONE DETECTED 03/21/2010 2130   THCU NONE DETECTED 03/21/2010 2130   LABBARB  Value: NONE DETECTED        DRUG SCREEN FOR MEDICAL PURPOSES ONLY.  IF CONFIRMATION IS NEEDED FOR ANY PURPOSE, NOTIFY LAB WITHIN 5 DAYS.        LOWEST DETECTABLE LIMITS FOR URINE DRUG SCREEN Drug Class       Cutoff (ng/mL) Amphetamine      1000 Barbiturate      200 Benzodiazepine   [id number] Tricyclics       [id number] Opiates          300 Cocaine          300 THC  50 03/21/2010 2130    Alcohol Level: No results found for this basename: ETH:2 in the last 72 hours Urinalysis:  Misc. Labs:  ABGS No results found for this basename: PHART,PCO2,PO2ART,TCO2,HCO3 in the last 72 hours CULTURES No results found for this or any previous visit (from the past 240 hour(s)). Studies/Results: Ct Maxillofacial W/cm  01/09/2011  *RADIOLOGY REPORT*  Clinical Data: Facial swelling, particularly in the orbital area.  CT MAXILLOFACIAL WITH CONTRAST  Technique:  Multidetector CT imaging of the maxillofacial structures was performed with intravenous contrast. Multiplanar CT image reconstructions were also generated.  Contrast:  70mL OMNIPAQUE IOHEXOL 300 MG/ML IV SOLN  Comparison: None.  Findings: Mild stranding within the subcutaneous soft tissues over the face and orbits, suggesting mild cellulitis.  Recommend clinical correlation.  No focal fluid collections to suggest abscesses.  Small scattered upper cervical lymph nodes, none pathologically enlarged.  Orbital soft tissues are unremarkable. Paranasal sinuses are clear.  No bony abnormality.  IMPRESSION: Mild subcutaneous edema/stranding suggesting the possibility of cellulitis.  Recommend clinical correlation.  No focal abscess.  Original Report Authenticated By: Raelyn Number, M.D.    Medications:  Prior to Admission:  Prescriptions prior to admission  Medication Sig Dispense Refill  . acetaminophen (TYLENOL) 500 MG tablet Take 1,000 mg by mouth every 6 (six) hours as needed. For pain       . ALPRAZolam (XANAX) 1 MG tablet Take 1 mg by mouth daily as needed. Anxiety      . diphenhydrAMINE (BENADRYL) 25 MG tablet Take 25 mg by mouth at bedtime as needed. For sleep      . fish oil-omega-3 fatty acids 1000 MG capsule Take 1 g by mouth daily.       . furosemide (LASIX) 20 MG tablet Take 20 mg by mouth 3 (three) times a week. On Mondays, Wednesdays, and Fridays       . insulin NPH-insulin regular (NOVOLIN 70/30) (70-30) 100 UNIT/ML injection Inject 12 Units into the skin 2 (two) times daily with a meal.        . insulin regular (HUMULIN R) 100 units/mL injection Inject 3-15 Units into the skin 3 (three) times daily as needed. Using according to sliding scale at home.       . losartan (COZAAR) 25 MG tablet Take 12.5 mg by mouth daily.        . Multiple Vitamins-Minerals (MULTIVITAMINS THER. W/MINERALS) TABS Take 1 tablet by mouth daily.        . potassium chloride SA (K-DUR,KLOR-CON) 20 MEQ tablet Take 0.5 tablets (10 mEq total) by mouth daily.  7 tablet  0  . sertraline (ZOLOFT) 50 MG tablet Take 50 mg by mouth daily.         Scheduled:   . clindamycin (CLEOCIN) IV   600 mg Intravenous Q8H  . enoxaparin (LOVENOX) injection  40 mg Subcutaneous Q24H  . furosemide  20 mg Oral 3 times weekly  . insulin aspart  0-9 Units Subcutaneous TID WC  . insulin aspart  10 Units Subcutaneous TID AC  . insulin glargine  30 Units Subcutaneous QHS  . losartan  12.5 mg Oral Daily  . mulitivitamin with minerals  1 tablet Oral Daily  . omega-3 acid ethyl esters  1 g Oral Daily  . potassium chloride  40 mEq Oral BID  . sertraline  50 mg Oral Daily  . vancomycin  1,000 mg Intravenous Q24H  . DISCONTD: insulin aspart  0-15 Units Subcutaneous TID WC  . DISCONTD:  insulin aspart  0-20 Units Subcutaneous TID WC  . DISCONTD: insulin aspart  15 Units Subcutaneous TID AC  . DISCONTD: insulin aspart  5 Units Subcutaneous TID AC  . DISCONTD: insulin glargine  20 Units Subcutaneous QHS  . DISCONTD: insulin glargine  40 Units Subcutaneous QHS  . DISCONTD: potassium chloride  20 mEq Oral Daily   Continuous:   . sodium chloride 75 mL/hr at 01/10/11 2220   [id number], ALPRAZolam, diphenhydrAMINE, HYDROcodone-acetaminophen, DISCONTD: HYDROcodone-acetaminophen  Assesment: She has cellulitis of the face. She is poorly controlled diabetes. She has glomerulonephritis which is causing a significant amount of swelling. Considering her diabetes renal dysfunction etc. I think she's going to need to be on IV antibiotics as mentioned. Active Problems:  * No active hospital problems. *     Plan: I will see if we can get a PICC line placed and if so she should be able to be discharged home on IV antibiotics    LOS: 2 days   Rebecka Oelkers L 01/11/2011, 8:38 AM

## 2011-01-11 NOTE — Progress Notes (Signed)
The patient was discharged today after failed attempt to get Pic in place. Orders cancelled for home health. The patient was instructed on discharge medications and instructed to call Dr Luan Pulling office for follow  Up appointment. She is alert and oriented and stated that she feels better and verbalized an understanding of the discharge instructions.

## 2011-01-11 NOTE — Progress Notes (Signed)
ANTIBIOTIC CONSULT NOTE -   Pharmacy Consult for Vancomcyin Indication: celullitis  Allergies  Allergen Reactions  . Aspirin     Cannot take this medication due to kidney disease  . Ibuprofen     Kidney    Patient Measurements: Height: 5\' 1"  (154.9 cm) Weight: 133 lb 13.1 oz (60.7 kg) IBW/kg (Calculated) : 47.8  Adjusted Body Weight:   Vital Signs: Temp: 97.9 F (36.6 C) (12/24 0615) Temp src: Oral (12/24 0615) BP: 102/64 mmHg (12/24 0615) Pulse Rate: 71  (12/24 0615) Intake/Output from previous day: 12/23 0701 - 12/24 0700 In: 480 [P.O.:480] Out: -  Intake/Output from this shift: Total I/O In: 240 [P.O.:240] Out: -   Labs:  Basename 01/11/11 0545 01/10/11 0808 01/09/11 1500  WBC -- -- 10.7*  HGB -- -- 13.0  PLT -- -- 500*  LABCREA -- -- --  CREATININE 1.03 1.21* 0.85   Estimated Creatinine Clearance: 60.1 ml/min (by C-G formula based on Cr of 1.03).    Microbiology: No results found for this or any previous visit (from the past 720 hour(s)).  Medical History: Past Medical History  Diagnosis Date  . Diabetes mellitus   . Shingles   . Renal disorder     glomerulonephritis    Medications:  Scheduled:    . clindamycin (CLEOCIN) IV  600 mg Intravenous Q8H  . enoxaparin (LOVENOX) injection  40 mg Subcutaneous Q24H  . furosemide  20 mg Oral 3 times weekly  . insulin aspart  0-9 Units Subcutaneous TID WC  . insulin aspart  10 Units Subcutaneous TID AC  . insulin glargine  30 Units Subcutaneous QHS  . losartan  12.5 mg Oral Daily  . mulitivitamin with minerals  1 tablet Oral Daily  . omega-3 acid ethyl esters  1 g Oral Daily  . potassium chloride  40 mEq Oral BID  . sertraline  50 mg Oral Daily  . vancomycin  1,250 mg Intravenous Q24H  . DISCONTD: insulin aspart  0-15 Units Subcutaneous TID WC  . DISCONTD: insulin aspart  0-20 Units Subcutaneous TID WC  . DISCONTD: insulin aspart  15 Units Subcutaneous TID AC  . DISCONTD: insulin aspart  5 Units  Subcutaneous TID AC  . DISCONTD: insulin glargine  20 Units Subcutaneous QHS  . DISCONTD: insulin glargine  40 Units Subcutaneous QHS  . DISCONTD: potassium chloride  20 mEq Oral Daily  . DISCONTD: vancomycin  1,000 mg Intravenous Q24H   Assessment: Empiric therapy for cellulitis. Initial regimen of Vancomycin 1000 mg IV every 24 hours, changed to day to 1250 mg IV every 24 hours.  Goal of Therapy:  Vancomycin trough level 10-15 mcg/ml  Plan:  Obtain trough at steady state (12-25 or 12-26).  Briscoe Burns, [place] J 01/11/2011,10:36 AM

## 2011-01-11 NOTE — Progress Notes (Signed)
NAME:  [name], [name]                ACCOUNT NO.:  [id number]  MEDICAL RECORD NO.:  [id number]  LOCATION:  [facility]                         FACILITY:  [facility]  PHYSICIAN:  [name], MD DATE OF BIRTH:  Jan 12, 1971  DATE OF PROCEDURE: DATE OF DISCHARGE:                                PROGRESS NOTE   CHIEF COMPLAINT:  Followup for uncontrolled type 1 diabetes.  SUBJECTIVE:  She feels better.  Hyperglycemia is better controlled.  She is eating well.  She hopes to go home today for the holidays.  OBJECTIVE:  VITAL SIGNS:  Within normal limits. HEENT:  Well hydrated, no icterus, no pallor. NECK:  Negative for JVD or thyromegaly. CHEST:  Clear to auscultation bilaterally. CARDIOVASCULAR:  Normal S1, S2.  No murmur.  No gallop. ABDOMEN:  Soft and nontender. EXTREMITIES:  No edema. CNS:  Nonfocal. SKIN:  No rash.  No hyperemia.  LABORATORY DATA:  Her blood glucose readings range from 117-280.  ASSESSMENT: 1. Type 1 diabetes, chronically uncontrolled with last A1c of 11%. 2. Facial cellulitis, on treatment. 3. Hyponatremia, resolved. 4. Hypertension, on treatment.  PLAN:  We will continue on Lantus 20 units nightly and NovoLog 10 units t.i.d. a.c. plus sensitive dose sliding scale.  The prevailing hyperglycemia is likely due to Solu-Medrol given at the emergency department.  The patient agrees to see me as an outpatient to follow up type 1 uncontrolled diabetes.  She is counseled regarding acute and chronic complications of type 1 diabetes related to retinopathy, nephropathy, coronary artery disease, CVA, and peripheral artery disease.          ______________________________ [name], MD     GN/MEDQ  D:  01/11/2011  T:  01/11/2011  Job:  [id number]

## 2011-01-11 NOTE — Progress Notes (Signed)
CARE MANAGEMENT NOTE 01/11/2011  Patient:  [name], [name]   Account Number:  [id number]  Date Initiated:  01/11/2011  Documentation initiated by:  Vladimir Creeks  Subjective/Objective Assessment:   admitted with cellulitis. Pt is from home, is independent, and will return home     Action/Plan:   will set up [facility]   Anticipated DC Date:  01/11/2011   Anticipated DC Plan:  [place]  CM consult      [place]   Choice offered to / List presented to:  C-1 Patient        [place] arranged  HH-1 RN      [place].   Status of service:  Completed, signed off Medicare Important Message given?   (If response is "NO", the following Medicare IM given date fields will be blank) Date Medicare IM given:   Date Additional Medicare IM given:    Discharge Disposition:  HOME/SELF CARE  Per UR Regulation:    Comments:  01/11/11  0930 Vladimir Creeks RN pt set up for HH/ IV ABX, but IV team unable to get a PICC line in, so MD changed orders and sent pt home with po ABX and HH cancelled- notified [facility]

## 2011-01-13 ENCOUNTER — Emergency Department (HOSPITAL_COMMUNITY)
Admission: EM | Admit: 2011-01-13 | Discharge: 2011-01-13 | Disposition: A | Discharge status: Home / Self Care | Payer: Medicaid Other | Attending: Emergency Medicine | Admitting: Emergency Medicine

## 2011-01-13 ENCOUNTER — Encounter (HOSPITAL_COMMUNITY): Payer: Self-pay | Admitting: Emergency Medicine

## 2011-01-13 DIAGNOSIS — R112 Nausea with vomiting, unspecified: Secondary | ICD-10-CM | POA: Insufficient documentation

## 2011-01-13 DIAGNOSIS — L0201 Cutaneous abscess of face: Secondary | ICD-10-CM | POA: Insufficient documentation

## 2011-01-13 DIAGNOSIS — R42 Dizziness and giddiness: Secondary | ICD-10-CM | POA: Insufficient documentation

## 2011-01-13 DIAGNOSIS — F17200 Nicotine dependence, unspecified, uncomplicated: Secondary | ICD-10-CM | POA: Insufficient documentation

## 2011-01-13 DIAGNOSIS — R221 Localized swelling, mass and lump, neck: Secondary | ICD-10-CM | POA: Insufficient documentation

## 2011-01-13 DIAGNOSIS — R51 Headache: Secondary | ICD-10-CM | POA: Insufficient documentation

## 2011-01-13 DIAGNOSIS — Z79899 Other long term (current) drug therapy: Secondary | ICD-10-CM | POA: Insufficient documentation

## 2011-01-13 DIAGNOSIS — Z794 Long term (current) use of insulin: Secondary | ICD-10-CM | POA: Insufficient documentation

## 2011-01-13 DIAGNOSIS — E119 Type 2 diabetes mellitus without complications: Secondary | ICD-10-CM | POA: Insufficient documentation

## 2011-01-13 DIAGNOSIS — L0390 Cellulitis, unspecified: Secondary | ICD-10-CM

## 2011-01-13 DIAGNOSIS — L03211 Cellulitis of face: Secondary | ICD-10-CM | POA: Insufficient documentation

## 2011-01-13 DIAGNOSIS — R220 Localized swelling, mass and lump, head: Secondary | ICD-10-CM | POA: Insufficient documentation

## 2011-01-13 LAB — GLUCOSE, CAPILLARY
Glucose-Capillary: 371 mg/dL — ABNORMAL HIGH (ref 70–99)
Glucose-Capillary: 56 mg/dL — ABNORMAL LOW (ref 70–99)

## 2011-01-13 MED ORDER — HYDROCODONE-ACETAMINOPHEN 5-325 MG PO TABS: 1.0000 | ORAL_TABLET | ORAL | Status: AC | PRN

## 2011-01-13 MED ORDER — OXYCODONE-ACETAMINOPHEN 5-325 MG PO TABS
2.0000 | ORAL_TABLET | Freq: Once | ORAL | Status: AC
  Administered 2011-01-13: 2 via ORAL
  Filled 2011-01-13: qty 2

## 2011-01-13 MED ORDER — ONDANSETRON HCL 4 MG PO TABS: 4.0000 mg | ORAL_TABLET | Freq: Four times a day (QID) | ORAL | Status: AC

## 2011-01-13 MED ORDER — LEVOFLOXACIN 500 MG PO TABS: 500.0000 mg | ORAL_TABLET | Freq: Every day | ORAL | Status: AC

## 2011-01-13 MED ORDER — ONDANSETRON 4 MG PO TBDP
4.0000 mg | ORAL_TABLET | Freq: Once | ORAL | Status: AC
  Administered 2011-01-13: 4 mg via ORAL
  Filled 2011-01-13: qty 1

## 2011-01-13 MED ORDER — LEVOFLOXACIN IN D5W 500 MG/100ML IV SOLN
500.0000 mg | Freq: Once | INTRAVENOUS | Status: AC
  Administered 2011-01-13: 500 mg via INTRAVENOUS
  Filled 2011-01-13: qty 100

## 2011-01-13 NOTE — ED Provider Notes (Signed)
This chart was scribed for [employer]. Olin Hauser, MD by Zella Ball. The patient was seen in room [facility]17/[facility] and the patient's care was started at 2:17 PM.  CSN: [id number]  Arrival date & time 01/13/11  1143   First MD Initiated Contact with Patient 01/13/11 1252      Chief Complaint  Patient presents with  . Recurrent Skin Infections  . Nausea  . Emesis  . Headache  . Dizziness    (Consider location/radiation/quality/duration/timing/severity/associated sxs/prior treatment) HPI 2:17 PM [name] is a 40 y.o. female who presents to the Emergency Department complaining of continued cellulitis of the face. Pt wasseen and admitted 01/11/11  for cellulitis of the face. Discharged on doxycycline. Medication resulted in nausea, vomiting, headaches, and dizziness. Pt denies any fever or chills. She states that the the pain has worsened but the swelling has reduced. She last took her doxycycline this morning.  Past Medical History  Diagnosis Date  . Diabetes mellitus   . Shingles   . Renal disorder     glomerulonephritis  . Cellulitis     Past Surgical History  Procedure Date  . Wrist surgery     left  . Tubal ligation     Family History  Problem Relation Age of Onset  . COPD Mother   . Hypertension Mother   . Diabetes Father   . Hypertension Father     History  Substance Use Topics  . Smoking status: Current Everyday Smoker -- 0.5 packs/day for 20 years    Types: Cigarettes  . Smokeless tobacco: Never Used  . Alcohol Use: No    OB History    Grav Para Term Preterm Abortions TAB SAB Ect Mult Living   1 1  1      1       Review of Systems 10 Systems reviewed and are negative for acute change except as noted in the HPI.  Allergies  Aspirin and Ibuprofen  Home Medications   Current Outpatient Rx  Name Route Sig Dispense Refill  . ACETAMINOPHEN 500 MG PO TABS Oral Take 1,000 mg by mouth every 6 (six) hours as needed. For pain     . ALPRAZOLAM 1 MG PO TABS  Oral Take 1 mg by mouth daily as needed. Anxiety    . DIPHENHYDRAMINE HCL 25 MG PO TABS Oral Take 25 mg by mouth at bedtime as needed. For sleep    . DOXYCYCLINE HYCLATE 50 MG PO CAPS Oral Take 2 capsules (100 mg total) by mouth 2 (two) times daily. 20 capsule 0  . OMEGA-3 FATTY ACIDS 1000 MG PO CAPS Oral Take 1 g by mouth daily.     . FUROSEMIDE 20 MG PO TABS Oral Take 20 mg by mouth 3 (three) times a week. On Mondays, Wednesdays, and Fridays     . HYDROCODONE-ACETAMINOPHEN 5-325 MG PO TABS Oral Take 1 tablet by mouth every 8 (eight) hours as needed. 90 tablet 0  . INSULIN ASPART 100 UNIT/ML [place] SOLN Subcutaneous Inject 10 Units into the skin 3 (three) times daily before meals. 1 vial 12  . INSULIN GLARGINE 100 UNIT/ML [place] SOLN Subcutaneous Inject 30 Units into the skin at bedtime. 10 mL 12  . LOSARTAN POTASSIUM 25 MG PO TABS Oral Take 12.5 mg by mouth daily.      [place] M PLUS PO TABS Oral Take 1 tablet by mouth daily.      Marland Kitchen POTASSIUM CHLORIDE CRYS CR 20 MEQ PO TBCR Oral Take 0.5  tablets (10 mEq total) by mouth daily. 7 tablet 0  . SERTRALINE HCL 50 MG PO TABS Oral Take 50 mg by mouth daily.        BP 119/70  Pulse 101  Temp(Src) 99.7 F (37.6 C) (Oral)  Resp 24  Ht 5\' 1"  (1.549 m)  Wt 125 lb (56.7 kg)  BMI 23.62 kg/m2  SpO2 98%  LMP 12/07/2010  Physical Exam  Nursing note and vitals reviewed. Constitutional: She is oriented to person, place, and time. She appears well-developed and well-nourished.  HENT:       Marked periorbital edema without erythema  Eyes: Conjunctivae and EOM are normal. Pupils are equal, round, and reactive to light.  Neck: Normal range of motion. Neck supple.  Cardiovascular: Normal rate and normal heart sounds.   Pulmonary/Chest: Effort normal and breath sounds normal.  Abdominal: Soft. Bowel sounds are normal.  Musculoskeletal: Normal range of motion.  Neurological: She is alert and oriented to person, place, and time.  Skin: Skin is warm and dry. No  erythema.  Psychiatric: She has a normal mood and affect. Her behavior is normal.    ED Course  Procedures (including critical care time)    MDM  Patient with a h/o recent cellulitis of her face currently on outpatient treatment which she is not tolerating. Given antiemetic and will change antibiotic. She has follow up with her PCP, Dr. Luan Pulling. Pt stable in ED with no significant deterioration in condition.The patient appears reasonably screened and/or stabilized for discharge and I doubt any other medical condition or other [facility] requiring further screening, evaluation, or treatment in the ED at this time prior to discharge.   I personally performed the services described in this documentation, which was scribed in my presence. The recorded information has been reviewed and considered.  MDM Reviewed: nursing note, previous chart and vitals Reviewed previous: labs       [employer]. Olin Hauser, MD 01/13/11 1536

## 2011-01-13 NOTE — ED Notes (Signed)
Patient was treated/admitted here on 01/11/11 by Dr Luan Pulling for cellulitis of face. Per patient was instructed to come back if she didn't get any better. Per patient swelling in face worse. Patient also reports nausea, vomiting, pain in face, headache, and dizziness.

## 2011-01-13 NOTE — Discharge Summary (Signed)
Physician Discharge Summary  Patient ID: [name] MRN: [id number] DOB/AGE: 07/19/1970 40 y.o. Primary Care Physician:Harpreet Signore L, MD, MD Admit date: 01/09/2011 Discharge date: 01/13/2011    Discharge Diagnoses:   Principal Problem:  *Cellulitis and abscess of face Active Problems:  Diabetes mellitus  Glomerulonephritis   Discharge Medication List as of 01/11/2011  2:34 PM    START taking these medications   Details  doxycycline (VIBRAMYCIN) 50 MG capsule Take 2 capsules (100 mg total) by mouth 2 (two) times daily., Starting 01/11/2011, Until Thu 01/21/11, Normal    HYDROcodone-acetaminophen (NORCO) 5-325 MG per tablet Take 1 tablet by mouth every 8 (eight) hours as needed., Starting 01/11/2011, Until Thu 01/21/11, Print    insulin aspart (NOVOLOG) 100 UNIT/ML injection Inject 10 Units into the skin 3 (three) times daily before meals., Starting 01/11/2011, Until Tue 01/11/12, Normal    insulin glargine (LANTUS) 100 UNIT/ML injection Inject 30 Units into the skin at bedtime., Starting 01/11/2011, Until Tue 01/11/12, Normal      CONTINUE these medications which have NOT CHANGED   Details  acetaminophen (TYLENOL) 500 MG tablet Take 1,000 mg by mouth every 6 (six) hours as needed. For pain , Until Discontinued, Historical Med    ALPRAZolam (XANAX) 1 MG tablet Take 1 mg by mouth daily as needed. Anxiety, Until Discontinued, Historical Med    diphenhydrAMINE (BENADRYL) 25 MG tablet Take 25 mg by mouth at bedtime as needed. For sleep, Until Discontinued, Historical Med    fish oil-omega-3 fatty acids 1000 MG capsule Take 1 g by mouth daily. , Until Discontinued, Historical Med    furosemide (LASIX) 20 MG tablet Take 20 mg by mouth 3 (three) times a week. On Mondays, Wednesdays, and Fridays , Until Discontinued, Historical Med    losartan (COZAAR) 25 MG tablet Take 12.5 mg by mouth daily.  , Until Discontinued, Historical Med    Multiple Vitamins-Minerals (MULTIVITAMINS  THER. W/MINERALS) TABS Take 1 tablet by mouth daily.  , Until Discontinued, Historical Med    potassium chloride SA (K-DUR,KLOR-CON) 20 MEQ tablet Take 0.5 tablets (10 mEq total) by mouth daily., Starting 10/09/2010, Until Sat 10/09/11, Normal    sertraline (ZOLOFT) 50 MG tablet Take 50 mg by mouth daily.  , Until Discontinued, Historical Med      STOP taking these medications     insulin NPH-insulin regular (NOVOLIN 70/30) (70-30) 100 UNIT/ML injection      insulin regular (HUMULIN R) 100 units/mL injection      clindamycin (CLEOCIN) 600 MG/50ML IVPB      vancomycin (VANCOCIN) 1 GM/200ML SOLN         Discharged Condition: Improved    Consults: Endocrinology  Significant Diagnostic Studies: Ct Maxillofacial W/cm  01/09/2011  *RADIOLOGY REPORT*  Clinical Data: Facial swelling, particularly in the orbital area.  CT MAXILLOFACIAL WITH CONTRAST  Technique:  Multidetector CT imaging of the maxillofacial structures was performed with intravenous contrast. Multiplanar CT image reconstructions were also generated.  Contrast: 50mL OMNIPAQUE IOHEXOL 300 MG/ML IV SOLN  Comparison: None.  Findings: Mild stranding within the subcutaneous soft tissues over the face and orbits, suggesting mild cellulitis.  Recommend clinical correlation.  No focal fluid collections to suggest abscesses.  Small scattered upper cervical lymph nodes, none pathologically enlarged.  Orbital soft tissues are unremarkable. Paranasal sinuses are clear.  No bony abnormality.  IMPRESSION: Mild subcutaneous edema/stranding suggesting the possibility of cellulitis.  Recommend clinical correlation.  No focal abscess.  Original Report Authenticated By: Raelyn Number, M.D.  Lab Results: Basic Metabolic Panel:  Basename 01/11/11 0545 01/10/11 1405 01/10/11 0808  NA 133* -- 125*  K 4.8 -- 4.6  CL 104 -- 92*  CO2 22 -- 23  GLUCOSE 302* 308* --  BUN 21 -- 18  CREATININE 1.03 -- 1.21*  CALCIUM 7.4* -- 7.8*  MG -- -- --    PHOS -- -- --   Liver Function Tests: No results found for this basename: AST:2,ALT:2,ALKPHOS:2,BILITOT:2,PROT:2,ALBUMIN:2 in the last 72 hours   CBC: No results found for this basename: WBC:2,NEUTROABS:2,HGB:2,HCT:2,MCV:2,PLT:2 in the last 72 hours  No results found for this or any previous visit (from the past 240 hour(s)).   Hospital Course: She was admitted with what appeared to be cellulitis of the face. She was started on double antibiotic treatments and improved. Her blood sugar was difficult to control which is a chronic problem. I obtained an endocrinology consultation and she will follow with an outpatient as well. Initial plans for her to have a PICC line placed and have IV antibiotics but PICC line could not be obtained and when the PICC line was being attempted her IV was discontinued. I had much discussion with the patient about what she wanted to do and we discussed having PICC line placed by interventional radiology but she said she did not want to have any more IV sticks and wanted to see if she could manage on by mouth antibiotics. She was discharged home on by mouth antibiotics with extensive counseling that she needed to return to the hospital immediately if she developed more facial swelling or if she developed fever or chills.  Discharge Exam: Blood pressure 102/64, pulse 71, temperature 97.9 F (36.6 C), temperature source Oral, resp. rate 18, height 5\' 1"  (1.549 m), weight 60.7 kg (133 lb 13.1 oz), last menstrual period 12/07/2010, SpO2 100.00%. Her face was less swollen. She was afebrile. Her chest was clear. Her heart was regular.  Disposition: Home with home health services and by mouth antibiotics  Discharge Orders    Future Orders Please Complete By Expires   Home Health      Questions: Responses:   To provide the following care/treatments RN   Face-to-face encounter      Comments:   I [name] certify that this patient is under my care and that I, or  a nurse practitioner or physician's assistant working with me, had a face-to-face encounter that meets the physician face-to-face encounter requirements with this patient on 01/11/2011.       Questions: Responses:   The encounter with the patient was in whole, or in part, for the following medical condition, which is the primary reason for home health care Cellulitis of face   I certify that, based on my findings, the following services are medically necessary home health services Nursing   My clinical findings support the need for the above services Infection requiring IV antibiotics   Further, I certify that my clinical findings support that this patient is homebound (i.e. absences from home require considerable and taxing effort and are for medical reasons or religious services or infrequently or of short duration when for other reasons) Immunocompromised   To provide the following care/treatments RN      Follow-up Information    Follow up with Alonza Bogus, MD .         Signed: Alonza Bogus Pager [phone]  01/13/2011, 8:05 AM

## 2011-01-27 ENCOUNTER — Inpatient Hospital Stay (HOSPITAL_COMMUNITY): Payer: Medicaid Other

## 2011-01-27 ENCOUNTER — Encounter (HOSPITAL_COMMUNITY): Payer: Self-pay | Admitting: Nurse Practitioner

## 2011-01-27 ENCOUNTER — Emergency Department (HOSPITAL_COMMUNITY): Payer: Medicaid Other

## 2011-01-27 ENCOUNTER — Ambulatory Visit (HOSPITAL_COMMUNITY): Admit: 2011-01-27 | Payer: Self-pay | Admitting: Vascular Surgery

## 2011-01-27 ENCOUNTER — Encounter (HOSPITAL_COMMUNITY)
Admission: EM | Disposition: A | Discharge status: Home / Self Care | Payer: Self-pay | Source: Home / Self Care | Attending: Vascular Surgery

## 2011-01-27 ENCOUNTER — Encounter (HOSPITAL_COMMUNITY): Payer: Self-pay | Admitting: *Deleted

## 2011-01-27 ENCOUNTER — Inpatient Hospital Stay (HOSPITAL_COMMUNITY)
Admission: EM | Admit: 2011-01-27 | Discharge: 2011-02-04 | DRG: 254 | Disposition: A | Discharge status: Home / Self Care | Payer: Medicaid Other | Attending: Vascular Surgery | Admitting: Vascular Surgery

## 2011-01-27 DIAGNOSIS — I798 Other disorders of arteries, arterioles and capillaries in diseases classified elsewhere: Secondary | ICD-10-CM | POA: Diagnosis present

## 2011-01-27 DIAGNOSIS — I7409 Other arterial embolism and thrombosis of abdominal aorta: Secondary | ICD-10-CM | POA: Diagnosis present

## 2011-01-27 DIAGNOSIS — E1159 Type 2 diabetes mellitus with other circulatory complications: Principal | ICD-10-CM | POA: Diagnosis present

## 2011-01-27 DIAGNOSIS — I739 Peripheral vascular disease, unspecified: Secondary | ICD-10-CM

## 2011-01-27 DIAGNOSIS — I743 Embolism and thrombosis of arteries of the lower extremities: Secondary | ICD-10-CM

## 2011-01-27 DIAGNOSIS — I498 Other specified cardiac arrhythmias: Secondary | ICD-10-CM | POA: Diagnosis present

## 2011-01-27 DIAGNOSIS — M79606 Pain in leg, unspecified: Secondary | ICD-10-CM

## 2011-01-27 DIAGNOSIS — E785 Hyperlipidemia, unspecified: Secondary | ICD-10-CM | POA: Insufficient documentation

## 2011-01-27 DIAGNOSIS — F17200 Nicotine dependence, unspecified, uncomplicated: Secondary | ICD-10-CM | POA: Diagnosis present

## 2011-01-27 DIAGNOSIS — E1151 Type 2 diabetes mellitus with diabetic peripheral angiopathy without gangrene: Secondary | ICD-10-CM

## 2011-01-27 DIAGNOSIS — IMO0001 Reserved for inherently not codable concepts without codable children: Secondary | ICD-10-CM | POA: Diagnosis present

## 2011-01-27 DIAGNOSIS — Z01810 Encounter for preprocedural cardiovascular examination: Secondary | ICD-10-CM

## 2011-01-27 DIAGNOSIS — I70219 Atherosclerosis of native arteries of extremities with intermittent claudication, unspecified extremity: Secondary | ICD-10-CM

## 2011-01-27 DIAGNOSIS — I998 Other disorder of circulatory system: Secondary | ICD-10-CM

## 2011-01-27 DIAGNOSIS — R9431 Abnormal electrocardiogram [ECG] [EKG]: Secondary | ICD-10-CM

## 2011-01-27 DIAGNOSIS — Z720 Tobacco use: Secondary | ICD-10-CM | POA: Insufficient documentation

## 2011-01-27 HISTORY — DX: Hyperlipidemia, unspecified: E78.5

## 2011-01-27 HISTORY — PX: LOWER EXTREMITY ANGIOGRAM: SHX5508

## 2011-01-27 LAB — TYPE AND SCREEN
ABO/RH(D): B POS
Antibody Screen: NEGATIVE
Unit division: 0
Unit division: 0
Unit division: 0
Unit division: 0

## 2011-01-27 LAB — PREPARE RBC (CROSSMATCH)

## 2011-01-27 LAB — BASIC METABOLIC PANEL
BUN: 11 mg/dL (ref 6–23)
CO2: 27 mEq/L (ref 19–32)
Calcium: 8.8 mg/dL (ref 8.4–10.5)
Chloride: 98 mEq/L (ref 96–112)
Creatinine, Ser: 0.74 mg/dL (ref 0.50–1.10)
GFR calc Af Amer: >90 mL/min (ref >90)
GFR calc non Af Amer: >90 mL/min (ref >90)
Glucose, Bld: 214 mg/dL — ABNORMAL HIGH (ref 70–99)
Potassium: 3.3 mEq/L — ABNORMAL LOW (ref 3.5–5.1)
Sodium: 131 mEq/L — ABNORMAL LOW (ref 135–145)

## 2011-01-27 LAB — DIFFERENTIAL
Basophils Absolute: 0 10*3/uL (ref 0.0–0.1)
Basophils Relative: 0 % (ref 0–1)
Eosinophils Absolute: 0.1 10*3/uL (ref 0.0–0.7)
Eosinophils Relative: 1 % (ref 0–5)
Lymphocytes Relative: 30 % (ref 12–46)
Lymphs Abs: 3.9 10*3/uL (ref 0.7–4.0)
Monocytes Absolute: 0.9 10*3/uL (ref 0.1–1.0)
Monocytes Relative: 7 % (ref 3–12)
Neutro Abs: 8 10*3/uL — ABNORMAL HIGH (ref 1.7–7.7)
Neutrophils Relative %: 62 % (ref 43–77)

## 2011-01-27 LAB — CBC
HCT: 37.2 % (ref 36.0–46.0)
Hemoglobin: 12.6 g/dL (ref 12.0–15.0)
MCH: 30 pg (ref 26.0–34.0)
MCHC: 33.9 g/dL (ref 30.0–36.0)
MCV: 88.6 fL (ref 78.0–100.0)
Platelets: 324 10*3/uL (ref 150–400)
RBC: 4.2 MIL/uL (ref 3.87–5.11)
RDW: 15.3 % (ref 11.5–15.5)
WBC: 13 10*3/uL — ABNORMAL HIGH (ref 4.0–10.5)

## 2011-01-27 LAB — APTT: aPTT: 25 seconds (ref 24–37)

## 2011-01-27 LAB — PROTIME-INR
INR: 0.91 (ref 0.00–1.49)
Prothrombin Time: 12.5 seconds (ref 11.6–15.2)

## 2011-01-27 LAB — MRSA PCR SCREENING: MRSA by PCR: NEGATIVE

## 2011-01-27 LAB — GLUCOSE, CAPILLARY
Glucose-Capillary: 214 mg/dL — ABNORMAL HIGH (ref 70–99)
Glucose-Capillary: 290 mg/dL — ABNORMAL HIGH (ref 70–99)
Glucose-Capillary: 305 mg/dL — ABNORMAL HIGH (ref 70–99)

## 2011-01-27 LAB — POCT ACTIVATED CLOTTING TIME: Activated Clotting Time: 226 seconds

## 2011-01-27 LAB — HEPARIN LEVEL (UNFRACTIONATED): Heparin Unfractionated: 0.13 IU/mL — ABNORMAL LOW (ref 0.30–0.70)

## 2011-01-27 LAB — ABO/RH: ABO/RH(D): B POS

## 2011-01-27 LAB — FIBRINOGEN: Fibrinogen: 591 mg/dL — ABNORMAL HIGH (ref 204–475)

## 2011-01-27 SURGERY — ANGIOGRAM, LOWER EXTREMITY
Anesthesia: LOCAL

## 2011-01-27 MED ORDER — GUAIFENESIN-DM 100-10 MG/5ML PO SYRP: 15.0000 mL | ORAL_SOLUTION | ORAL | Status: DC | PRN

## 2011-01-27 MED ORDER — PHENOL 1.4 % MT LIQD: 1.0000 | OROMUCOSAL | Status: DC | PRN

## 2011-01-27 MED ORDER — INSULIN ASPART 100 UNIT/ML ~~LOC~~ SOLN
0.0000 [IU] | SUBCUTANEOUS | Status: DC
  Administered 2011-01-27: 11 [IU] via SUBCUTANEOUS
  Administered 2011-01-28: 15 [IU] via SUBCUTANEOUS
  Administered 2011-01-28: 3 [IU] via SUBCUTANEOUS
  Administered 2011-01-28: 5 [IU] via SUBCUTANEOUS
  Administered 2011-01-29: 8 [IU] via SUBCUTANEOUS
  Administered 2011-01-29: 11 [IU] via SUBCUTANEOUS
  Administered 2011-01-29: 5 [IU] via SUBCUTANEOUS
  Administered 2011-01-30 (×3): 3 [IU] via SUBCUTANEOUS
  Administered 2011-01-30: 2 [IU] via SUBCUTANEOUS
  Administered 2011-01-30 – 2011-01-31 (×2): 3 [IU] via SUBCUTANEOUS
  Administered 2011-02-01: 2 [IU] via SUBCUTANEOUS
  Administered 2011-02-01: 3 [IU] via SUBCUTANEOUS
  Administered 2011-02-02: 5 [IU] via SUBCUTANEOUS
  Administered 2011-02-02: 3 [IU] via SUBCUTANEOUS
  Filled 2011-01-27: qty 3

## 2011-01-27 MED ORDER — FAMOTIDINE IN NACL 20-0.9 MG/50ML-% IV SOLN
20.0000 mg | Freq: Two times a day (BID) | INTRAVENOUS | Status: DC
  Administered 2011-01-27 – 2011-02-01 (×10): 20 mg via INTRAVENOUS
  Filled 2011-01-27 (×13): qty 50

## 2011-01-27 MED ORDER — ROSUVASTATIN CALCIUM 40 MG PO TABS
40.0000 mg | ORAL_TABLET | Freq: Every day | ORAL | Status: DC
  Administered 2011-01-28 – 2011-02-03 (×6): 40 mg via ORAL
  Filled 2011-01-27 (×9): qty 1

## 2011-01-27 MED ORDER — OXYCODONE HCL 5 MG PO TABS
5.0000 mg | ORAL_TABLET | ORAL | Status: DC | PRN
  Administered 2011-01-27 – 2011-02-02 (×10): 10 mg via ORAL
  Filled 2011-01-27 (×9): qty 2

## 2011-01-27 MED ORDER — HYDROMORPHONE HCL PF 1 MG/ML IJ SOLN
0.5000 mg | INTRAMUSCULAR | Status: DC | PRN
  Administered 2011-01-27: 1 mg via INTRAVENOUS
  Administered 2011-01-27 (×2): 0.5 mg via INTRAVENOUS
  Filled 2011-01-27 (×2): qty 1

## 2011-01-27 MED ORDER — HEPARIN SOD (PORCINE) IN D5W 100 UNIT/ML IV SOLN
500.0000 [IU]/h | INTRAVENOUS | Status: DC
  Filled 2011-01-27: qty 250

## 2011-01-27 MED ORDER — MAGNESIUM CITRATE PO SOLN
1.0000 | Freq: Once | ORAL | Status: DC | PRN
  Filled 2011-01-27: qty 296

## 2011-01-27 MED ORDER — PROMETHAZINE HCL 25 MG/ML IJ SOLN
12.5000 mg | INTRAMUSCULAR | Status: AC
  Filled 2011-01-27 (×2): qty 1

## 2011-01-27 MED ORDER — HYDROMORPHONE HCL PF 1 MG/ML IJ SOLN
1.0000 mg | Freq: Once | INTRAMUSCULAR | Status: AC
  Administered 2011-01-27: 1 mg via INTRAVENOUS
  Filled 2011-01-27: qty 1

## 2011-01-27 MED ORDER — ALUM & MAG HYDROXIDE-SIMETH 200-200-20 MG/5ML PO SUSP: 15.0000 mL | ORAL | Status: DC | PRN

## 2011-01-27 MED ORDER — SODIUM CHLORIDE 0.9 % IJ SOLN: 3.0000 mL | INTRAMUSCULAR | Status: DC | PRN

## 2011-01-27 MED ORDER — ALPRAZOLAM 0.25 MG PO TABS
0.5000 mg | ORAL_TABLET | ORAL | Status: DC | PRN
  Administered 2011-01-27 – 2011-01-28 (×2): 0.5 mg via ORAL
  Filled 2011-01-27: qty 2

## 2011-01-27 MED ORDER — SODIUM CHLORIDE 0.9 % IV SOLN
INTRAVENOUS | Status: DC
  Administered 2011-01-27: 50 mL/h via INTRAVENOUS
  Administered 2011-01-29: 10:00:00 via INTRAVENOUS

## 2011-01-27 MED ORDER — HEPARIN (PORCINE) IN NACL 2-0.9 UNIT/ML-% IJ SOLN
INTRAMUSCULAR | Status: AC
  Filled 2011-01-27: qty 1000

## 2011-01-27 MED ORDER — DIPHENHYDRAMINE HCL 50 MG/ML IJ SOLN
12.5000 mg | Freq: Four times a day (QID) | INTRAMUSCULAR | Status: DC | PRN
  Administered 2011-01-28: 25 mg via INTRAVENOUS
  Administered 2011-01-28 – 2011-01-29 (×2): 12.5 mg via INTRAVENOUS
  Administered 2011-01-30: 25 mg via INTRAVENOUS
  Filled 2011-01-27 (×5): qty 1

## 2011-01-27 MED ORDER — HYDRALAZINE HCL 20 MG/ML IJ SOLN
10.0000 mg | INTRAMUSCULAR | Status: DC | PRN
  Filled 2011-01-27: qty 0.5

## 2011-01-27 MED ORDER — HYDROMORPHONE HCL PF 2 MG/ML IJ SOLN
INTRAMUSCULAR | Status: AC
  Filled 2011-01-27: qty 1

## 2011-01-27 MED ORDER — HEPARIN SOD (PORCINE) IN D5W 100 UNIT/ML IV SOLN
500.0000 [IU]/h | INTRAVENOUS | Status: DC
  Administered 2011-01-27: 500 [IU]/h via INTRAVENOUS

## 2011-01-27 MED ORDER — LABETALOL HCL 5 MG/ML IV SOLN: 10.0000 mg | INTRAVENOUS | Status: DC | PRN

## 2011-01-27 MED ORDER — SODIUM CHLORIDE 0.9 % IV SOLN: 250.0000 mL | INTRAVENOUS | Status: DC | PRN

## 2011-01-27 MED ORDER — OXYCODONE HCL 5 MG PO TABS
ORAL_TABLET | ORAL | Status: AC
  Filled 2011-01-27: qty 2

## 2011-01-27 MED ORDER — SODIUM CHLORIDE 0.9 % IJ SOLN
3.0000 mL | Freq: Two times a day (BID) | INTRAMUSCULAR | Status: DC
  Administered 2011-01-28 – 2011-01-30 (×3): 3 mL via INTRAVENOUS

## 2011-01-27 MED ORDER — METOPROLOL TARTRATE 12.5 MG HALF TABLET
12.5000 mg | ORAL_TABLET | Freq: Two times a day (BID) | ORAL | Status: DC
  Administered 2011-01-28 – 2011-02-03 (×10): 12.5 mg via ORAL
  Filled 2011-01-27 (×17): qty 1

## 2011-01-27 MED ORDER — ZOLPIDEM TARTRATE 5 MG PO TABS: 5.0000 mg | ORAL_TABLET | Freq: Every evening | ORAL | Status: DC | PRN

## 2011-01-27 MED ORDER — HEPARIN SOD (PORCINE) IN D5W 100 UNIT/ML IV SOLN
500.0000 [IU]/h | INTRAVENOUS | Status: DC
  Administered 2011-01-27 – 2011-01-28 (×2): 500 [IU]/h via INTRAVENOUS
  Filled 2011-01-27 (×2): qty 250

## 2011-01-27 MED ORDER — ONDANSETRON HCL 4 MG/2ML IJ SOLN
4.0000 mg | Freq: Once | INTRAMUSCULAR | Status: AC
  Administered 2011-01-27: 4 mg via INTRAVENOUS
  Filled 2011-01-27: qty 2

## 2011-01-27 MED ORDER — HEPARIN SOD (PORCINE) IN D5W 100 UNIT/ML IV SOLN
500.0000 [IU]/h | INTRAVENOUS | Status: DC
  Administered 2011-01-28: 500 [IU]/h via INTRAVENOUS
  Filled 2011-01-27: qty 250

## 2011-01-27 MED ORDER — TENECTEPLASE 50 MG IV KIT
0.5000 mg/h | PACK | INTRAVENOUS | Status: DC
  Administered 2011-01-28: 0.5 mg/h via INTRAVENOUS
  Filled 2011-01-27 (×4): qty 0.5

## 2011-01-27 MED ORDER — HEPARIN SODIUM (PORCINE) 1000 UNIT/ML IJ SOLN
INTRAMUSCULAR | Status: AC
  Filled 2011-01-27: qty 1

## 2011-01-27 MED ORDER — ALPRAZOLAM 0.25 MG PO TABS
ORAL_TABLET | ORAL | Status: AC
  Filled 2011-01-27: qty 2

## 2011-01-27 MED ORDER — HYDROMORPHONE HCL PF 1 MG/ML IJ SOLN
0.5000 mg | INTRAMUSCULAR | Status: DC | PRN
  Administered 2011-01-27: 2 mg via INTRAVENOUS
  Administered 2011-01-28: 1 mg via INTRAVENOUS
  Administered 2011-01-28: 2 mg via INTRAVENOUS
  Administered 2011-01-28 (×2): 1 mg via INTRAVENOUS
  Administered 2011-01-28 (×2): 2 mg via INTRAVENOUS
  Administered 2011-01-28 (×4): 1 mg via INTRAVENOUS
  Filled 2011-01-27: qty 1
  Filled 2011-01-27: qty 2
  Filled 2011-01-27 (×2): qty 1
  Filled 2011-01-27 (×2): qty 2
  Filled 2011-01-27 (×2): qty 1
  Filled 2011-01-27: qty 2
  Filled 2011-01-27 (×2): qty 1

## 2011-01-27 MED ORDER — LIDOCAINE HCL (PF) 1 % IJ SOLN
INTRAMUSCULAR | Status: AC
  Filled 2011-01-27: qty 30

## 2011-01-27 MED ORDER — METOPROLOL TARTRATE 1 MG/ML IV SOLN: 2.0000 mg | INTRAVENOUS | Status: DC | PRN

## 2011-01-27 MED ORDER — ONDANSETRON HCL 4 MG/2ML IJ SOLN: 4.0000 mg | Freq: Four times a day (QID) | INTRAMUSCULAR | Status: DC | PRN

## 2011-01-27 MED ORDER — POTASSIUM CHLORIDE CRYS ER 20 MEQ PO TBCR: 20.0000 meq | EXTENDED_RELEASE_TABLET | Freq: Once | ORAL | Status: DC

## 2011-01-27 NOTE — Op Note (Signed)
OPERATIVE REPORT  DATE OF SURGERY: 01/27/2011  PATIENT: [name], 41 y.o. female MRN: [id number]  DOB: 07/17/1970  PRE-OPERATIVE DIAGNOSIS: Left foot ischemia  POST-OPERATIVE DIAGNOSIS:  Same  PROCEDURE: Aortogram with bilateral lower extremity runoff, initiation of thrombolyzes left lower extremity  SURGEON:  Curt Jews, M.D.  PHYSICIAN ASSISTANT: None  ANESTHESIA:  Local 1% lidocaine  EBL: Minimal ml     BLOOD ADMINISTERED: None  DRAINS: None   SPECIMEN: None  COUNTS CORRECT:  YES  PLAN OF CARE: ICU with thrombolyzes     PROCEDURE DETAILS: The patient was taken to the peripheral vascular or Cath Lab and placed supine position. Both groins were prepped and draped in the usual sterile fashion using local anesthesia and a singlewall puncture the right common femoral artery was entered and a guidewire was passed up to the level of the suprarenal aorta. A 5 French sheath was passed over the guidewire and pigtail catheter was positioned at the level of the suprarenal aorta. An AP projection was undertaken and this revealed a patent renal arteries bilaterally. 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 The distal aorta and iliac vessels were completely normal. Due to the aortic thrombus decision was made to study the arch throughout anomalies more proximally. The pigtail catheter to position the patient and arch in a 40 LAO projection was undertaken. This revealed a normal aortic arch and normal descending thoracic aorta.  The pigtail catheter was then brought to the level of the aorta just above the bifurcation. Long leg runoff was obtained. This revealed a normal common profundus superficial femoral and popliteal arteries bilaterally. On the right leg there was normal trifurcation with normal flow into the foot. On the left there was a normal proximal trifurcation with occlusion of the anterior tibial and peroneal arteries  and mid calf the posterior tibial artery was in line to the ankle and then was occluded with refilling into the foot via collaterals. Additional films were obtained and the left foot by exchanging the pigtail catheter for an endhole catheter. This was accessed into the left common iliac artery with a crossover catheter and then exchanged for an endhole catheter was positioned at the level of the common femoral artery. This gave better detail of the left foot.  Decision was made to attempt thrombolyzes of the tibial and pedal vessels on the left foot. The long endhole catheter was placed over a guidewire and a long endhole catheter was positioned at the level of the left below-knee popliteal artery. This was due to the right femoral sheath. The patient was given 5000 units of heparin during these manipulations.  The patient was transferred to the holding area with no immediate complications for thrombolyzes through the catheter.  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 01/27/2011 1:56 PM

## 2011-01-27 NOTE — H&P (Signed)
Vascular Surgery H&P  Chief Complaint: Painful left foot  HPI: [name] is a 41 y.o. female who presents for evaluation of a painful left foot. This patient awoke this morning with pain in her cool and slightly numb sensation on the left foot particularly laterally. She has no previous history of this problem. She denies any calf claudication symptoms being able to walk a few blocks prior to that. She has no symptoms in the contralateral right foot. There was no trauma to the foot. She does have diabetes mellitus but has no history of infection, ulceration, or rest pain. She was seen in the [facility] emergency department and referred here for further evaluation   Past Medical History  Diagnosis Date  . Diabetes mellitus   . Shingles   . Renal disorder     glomerulonephritis  . Cellulitis    Past Surgical History  Procedure Date  . Wrist surgery     left  . Tubal ligation    History   Social History  . Marital Status: Single    Spouse Name: N/A    Number of Children: N/A  . Years of Education: N/A   Social History Main Topics  . Smoking status: Current Everyday Smoker -- 0.5 packs/day for 20 years    Types: Cigarettes  . Smokeless tobacco: Never Used  . Alcohol Use: No  . Drug Use: 1 per week    Special: Marijuana     9 months ago  . Sexually Active: Yes    Birth Control/ Protection: Surgical   Other Topics Concern  . None   Social History Narrative  . None   Family History  Problem Relation Age of Onset  . COPD Mother   . Hypertension Mother   . Diabetes Father   . Hypertension Father    Allergies  Allergen Reactions  . Aspirin     Cannot take this medication due to kidney disease  . Ibuprofen     Kidney   Prior to Admission medications   Medication Sig Start Date End Date Taking? Authorizing Provider  acetaminophen (TYLENOL) 500 MG tablet Take 1,000 mg by mouth every 6 (six) hours as needed. For pain    Yes Historical Provider, MD  ALPRAZolam  Duanne Moron) 1 MG tablet Take 1 mg by mouth daily as needed. Anxiety   Yes Historical Provider, MD  diphenhydrAMINE (BENADRYL) 25 MG tablet Take 25 mg by mouth at bedtime as needed. For sleep   Yes Historical Provider, MD  fish oil-omega-3 fatty acids 1000 MG capsule Take 1 g by mouth daily.    Yes Historical Provider, MD  furosemide (LASIX) 20 MG tablet Take 20 mg by mouth 3 (three) times a week. On Mondays, Wednesdays, and Fridays    Yes Historical Provider, MD  insulin aspart (NOVOLOG) 100 UNIT/ML injection Inject 10 Units into the skin 3 (three) times daily before meals. 01/11/11 01/11/12 Yes Alonza Bogus, MD  insulin glargine (LANTUS) 100 UNIT/ML injection Inject 30 Units into the skin at bedtime. 01/11/11 01/11/12 Yes Alonza Bogus, MD  losartan (COZAAR) 25 MG tablet Take 12.5 mg by mouth daily.     Yes Historical Provider, MD  Multiple Vitamins-Minerals (MULTIVITAMINS THER. W/MINERALS) TABS Take 1 tablet by mouth daily.     Yes Historical Provider, MD  potassium chloride SA (K-DUR,KLOR-CON) 20 MEQ tablet Take 0.5 tablets (10 mEq total) by mouth daily. 10/09/10 10/09/11 Yes Terry S. Olin Hauser, MD  sertraline (ZOLOFT) 50 MG tablet Take 50  mg by mouth daily.     Yes Historical Provider, MD     Positive ROS: Denies chest pain, dyspnea on exertion, PND, orthopnea, hemoptysis, lateralizing weakness, diplopia, blurred vision, or syncope.  All other systems have been reviewed and were otherwise negative with the exception of those mentioned in the HPI and as above.  Physical Exam: Filed Vitals:   01/27/11 1031  BP: 113/63  Pulse: 102  Temp: 98.8 F (37.1 C)  Resp:     General: Alert, no acute distress HEENT: Normal for age Cardiovascular: Regular rate and rhythm. Carotid pulses 2+, no bruits audible Respiratory: Clear to auscultation. No cyanosis, no use of accessory musculature GI: No organomegaly, abdomen is soft and non-tender Skin: No lesions in the area of chief complaint Neurologic:  Sensation intact distally Psychiatric: Patient is competent for consent with normal mood and affect Musculoskeletal: No obvious deformities Extremities: Left leg has 3+ femoral 2+ popliteal and 2+ posterior tibial pulse palpable. There is some mottling on the lateral aspect of the foot as well as anteriorly medially. This left foot is slightly cooler than the right. There she does complain of pain with movement of the toes. There is no left calf tenderness. She has excellent Doppler flow in the left posterior tibial artery but absent Doppler flow in the dorsalis pedis artery. Right leg has 3+ femoral popliteal and posterior tibial pulses palpable.      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

## 2011-01-27 NOTE — Progress Notes (Signed)
ANTIBIOTIC CONSULT NOTE - Renal Adjustments  Pharmacy Consult for Renal Adjustment for ABX.   Allergies  Allergen Reactions  . Aspirin     Cannot take this medication due to kidney disease  . Ibuprofen     Kidney    Patient Measurements: Height: 5\' 1"  (154.9 cm) Weight: 125 lb (56.7 kg) IBW/kg (Calculated) : 47.8    Vital Signs: Temp: 98.4 F (36.9 C) (01/09 1208) Temp src: Oral (01/09 1208) BP: 90/52 mmHg (01/09 1800) Pulse Rate: 90  (01/09 1800)  Labs:  Basename 01/27/11 1033  WBC 13.0*  HGB 12.6  PLT 324  LABCREA --  CREATININE 0.74   Estimated Creatinine Clearance: 69.8 ml/min (by C-G formula based on Cr of 0.74). No results found for this basename: VANCOTROUGH:2,VANCOPEAK:2,VANCORANDOM:2,GENTTROUGH:2,GENTPEAK:2,GENTRANDOM:2,TOBRATROUGH:2,TOBRAPEAK:2,TOBRARND:2,AMIKACINPEAK:2,AMIKACINTROU:2,AMIKACIN:2, in the last 72 hours   Microbiology: No results found for this or any previous visit (from the past 720 hour(s)).  Anti-infectives    None      Assessment: 41 yo female admitted with a painful left foot.  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  She is receiving TNK into the tibial artery with plans for further workup and treatment later this week.  Currently, her creatinine is 0.74 with an estimated clearance of 70 ml/min.  She is not currently receiving any antibiotics.  Goal of Therapy:  Appropriate antibiotic dosing for renal function.  Plan:  1.  Will follow for plans to initiate antibiotics for her.   Rober Minion, PharmD., MS Clinical Pharmacist Pager:  [phone] 01/27/2011,6:55 PM

## 2011-01-27 NOTE — ED Notes (Signed)
Gave report to [facility], they are on the way

## 2011-01-27 NOTE — ED Provider Notes (Signed)
History     CSN: [id number]  Arrival date & time 01/27/11  [id number]   First MD Initiated Contact with Patient 01/27/11 0935      Chief Complaint  Patient presents with  . Leg Pain    (Consider location/radiation/quality/duration/timing/severity/associated sxs/prior treatment) Patient is a 41 y.o. female presenting with leg pain. The history is provided by the patient.  Leg Pain  The incident occurred 6 to 12 hours ago. The incident occurred at home (Patient woke at 3 am with severe pain in her left foot). There was no injury mechanism. The pain is present in the left foot. The quality of the pain is described as burning and throbbing. The pain is at a severity of 10/10. The pain is severe. The pain has been worsening since onset. Associated symptoms include numbness and loss of sensation. Associated symptoms comments: Pain is progressing up her foot and now starting to feel discomfort in her posterior calf.  She also states her right great toe has mild discomfort at well.  She has noticed her toes are becoming more discolored.. Exacerbated by: Pain is constant without alleviators.  Palpation and movement makes worse. She has tried nothing for the symptoms.    Past Medical History  Diagnosis Date  . Diabetes mellitus   . Shingles   . Renal disorder     glomerulonephritis  . Cellulitis     Past Surgical History  Procedure Date  . Wrist surgery     left  . Tubal ligation     Family History  Problem Relation Age of Onset  . COPD Mother   . Hypertension Mother   . Diabetes Father   . Hypertension Father     History  Substance Use Topics  . Smoking status: Current Everyday Smoker -- 0.5 packs/day for 20 years    Types: Cigarettes  . Smokeless tobacco: Never Used  . Alcohol Use: No    OB History    Grav Para Term Preterm Abortions TAB SAB Ect Mult Living   1 1  1      1       Review of Systems  Constitutional: Negative for fever and chills.  HENT: Negative for  congestion, sore throat and neck pain.   Eyes: Negative.   Respiratory: Negative for chest tightness and shortness of breath.   Cardiovascular: Negative for chest pain, palpitations and leg swelling.  Gastrointestinal: Positive for nausea. Negative for vomiting and abdominal pain.  Genitourinary: Negative.   Musculoskeletal: Positive for arthralgias. Negative for joint swelling.  Skin: Positive for color change. Negative for rash and wound.  Neurological: Positive for numbness. Negative for dizziness, weakness, light-headedness and headaches.  Hematological: Negative.   Psychiatric/Behavioral: Negative.     Allergies  Aspirin and Ibuprofen  Home Medications   Current Outpatient Rx  Name Route Sig Dispense Refill  . ACETAMINOPHEN 500 MG PO TABS Oral Take 1,000 mg by mouth every 6 (six) hours as needed. For pain     . ALPRAZOLAM 1 MG PO TABS Oral Take 1 mg by mouth daily as needed. Anxiety    . DIPHENHYDRAMINE HCL 25 MG PO TABS Oral Take 25 mg by mouth at bedtime as needed. For sleep    . OMEGA-3 FATTY ACIDS 1000 MG PO CAPS Oral Take 1 g by mouth daily.     . FUROSEMIDE 20 MG PO TABS Oral Take 20 mg by mouth 3 (three) times a week. On Mondays, Wednesdays, and Fridays     . INSULIN  ASPART 100 UNIT/ML [place] SOLN Subcutaneous Inject 10 Units into the skin 3 (three) times daily before meals. 1 vial 12  . INSULIN GLARGINE 100 UNIT/ML [place] SOLN Subcutaneous Inject 30 Units into the skin at bedtime. 10 mL 12  . LOSARTAN POTASSIUM 25 MG PO TABS Oral Take 12.5 mg by mouth daily.      [place] M PLUS PO TABS Oral Take 1 tablet by mouth daily.      Marland Kitchen POTASSIUM CHLORIDE CRYS ER 20 MEQ PO TBCR Oral Take 0.5 tablets (10 mEq total) by mouth daily. 7 tablet 0  . SERTRALINE HCL 50 MG PO TABS Oral Take 50 mg by mouth daily.        BP 113/63  Pulse 102  Temp(Src) 98.8 F (37.1 C) (Oral)  Resp 16  Ht 5\' 1"  (1.549 m)  Wt 125 lb (56.7 kg)  BMI 23.62 kg/m2  SpO2 97%  LMP 01/12/2011  Physical Exam    Nursing note and vitals reviewed. Constitutional: She is oriented to person, place, and time. She appears well-developed and well-nourished. She appears distressed.  HENT:  Head: Normocephalic and atraumatic.  Eyes: Conjunctivae are normal.  Neck: Normal range of motion.  Cardiovascular: Normal rate, regular rhythm and normal heart sounds.   Pulses:      Femoral pulses are 1+ on the right side, and 1+ on the left side.      Dorsalis pedis pulses are 0 on the right side, and 0 on the left side.       Posterior tibial pulses are 0 on the right side, and 0 on the left side.       Able to find bilateral posterior tibial pulses easily with bedside doppler,  But cannot find dorsal pedis in either foot with doppler.,  Unable to appreciate dorsalis pedis and posterior tibial with palpation.  Cap refill >5sec bilateral great toes.  Pulmonary/Chest: Effort normal and breath sounds normal. She has no wheezes.  Abdominal: Soft. Bowel sounds are normal. There is no tenderness.  Musculoskeletal: Normal range of motion.  Neurological: She is alert and oriented to person, place, and time.  Skin: Skin is warm and dry.       Dusky toes (all) on left foot with scattered mottling of dorsal foot to ankle.  No pedal or ankle edema,  Calf is soft.  Great toe only on right foot is also dusky,  No scattered mottling in right lower extremity.  Eccymosis noted left antecubital space (recent IV site from admission for cellulitis 01/11/11.)  Psychiatric: She has a normal mood and affect.    ED Course  Procedures (including critical care time)  Labs Reviewed  CBC - Abnormal; Notable for the following:    WBC 13.0 (*)    All other components within normal limits  DIFFERENTIAL - Abnormal; Notable for the following:    Neutro Abs 8.0 (*)    All other components within normal limits  PROTIME-INR  APTT  BASIC METABOLIC PANEL  URINALYSIS, ROUTINE W REFLEX MICROSCOPIC  PREGNANCY, URINE   Dg Chest Portable 1  View  01/27/2011  *RADIOLOGY REPORT*  Clinical Data: Left lower extremity pain, no pulses, hypertension  PORTABLE CHEST - 1 VIEW  Comparison: Portable exam 1029 hours compared to 03/21/2010  Findings: Normal heart size, mediastinal contours, and pulmonary vascularity. Mild peribronchial thickening. No pulmonary infiltrate, pleural effusion, or pneumothorax. Bones unremarkable.  IMPRESSION: Minimal chronic bronchitic changes.  Original Report Authenticated By: Burnetta Sabin, M.D.  1. Lower extremity pain   2. Vascular insufficiency       MDM  Call placed to Dr. Kellie Simmering with vascular surgery.  Will see in ed at [facility],  In lieu of completing vascular study here first.  Patient to be transported [facility].   Spoke with Dr. Monia Pouch at [place] - aware of pt and need to call Dr. Kellie Simmering upon pt arrival there.     Fulton Reek, PA 01/27/11 1102  Fulton Reek, PA 01/27/11 1103    Date: 01/27/2011  Rate: 107  Rhythm: sinus tachycardia  QRS Axis: normal  Intervals: normal  ST/T Wave abnormalities: Q waves in anterior leads,  present on ekg dtd 11/03/10  Conduction Disutrbances:left anterior fascicular block  Narrative Interpretation:   Old EKG Reviewed: unchanged    Fulton Reek, PA 01/27/11 1106

## 2011-01-27 NOTE — ED Notes (Signed)
Pt states sharp pain began at 0300 to left toes. Pain has now traveled half-way up lower leg. Pain described as sharp, shooting, burning  Pain with numbness. Bruising noted to foot. Painful to touch. Pt also states nausea due to pain.

## 2011-01-27 NOTE — ED Notes (Signed)
Called [facility] for Vascular surgery for PA. Idol to [phone].

## 2011-01-27 NOTE — ED Notes (Signed)
Dr Kellie Simmering at bedside evaluating pt.

## 2011-01-27 NOTE — ED Notes (Signed)
Called [facility], gave report to [employer]

## 2011-01-27 NOTE — ED Notes (Signed)
Pt to be taken to the [facility] lab by dr. Kellie Simmering

## 2011-01-27 NOTE — Progress Notes (Signed)
Patient ID: [name], female   DOB: Jul 02, 1970, 41 y.o.   MRN: [id number] Vascular Surgery Progress Note  Subjective:   Objective:  Filed Vitals:   01/27/11 1208  BP: 112/74  Pulse: 93  Temp: 98.4 F (36.9 C)  Resp: 20    Patient had angiography performed today. This reveals that she does have thrombus in her abdominal aorta. Her iliac arteries did not appear to be diseased with atherosclerosis. Her superficial femoral popliteal arteries are widely patent. Tibial vessels on the right a widely patent. It appears that she has embolized to her left anterior tibial artery distally into the foot. There was no thrombus in her aortic arch.   Labs:  Lab 01/27/11 1033  CREATININE 0.74    Lab 01/27/11 1033  NA 131*  K 3.3*  CL 98  CO2 27  BUN 11  CREATININE 0.74  LABGLOM --  GLUCOSE 214*  CALCIUM 8.8    Lab 01/27/11 1033  WBC 13.0*  HGB 12.6  HCT 37.2  PLT 324    Lab 01/27/11 1033  INR 0.91       Imaging: Dg Chest Portable 1 View  01/27/2011  *RADIOLOGY REPORT*  Clinical Data: Left lower extremity pain, no pulses, hypertension  PORTABLE CHEST - 1 VIEW  Comparison: Portable exam 1029 hours compared to 03/21/2010  Findings: Normal heart size, mediastinal contours, and pulmonary vascularity. Mild peribronchial thickening. No pulmonary infiltrate, pleural effusion, or pneumothorax. Bones unremarkable.  IMPRESSION: Minimal chronic bronchitic changes.  Original Report Authenticated By: Burnetta Sabin, M.D.    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 She has embolized to her left tibial vessels (anterior tibial). She does have some ischemic changes in the left foot. She has good motion and sensation in the left foot. Plan #1 Will treat thrombus in tibial artery with intra-arterial infusion of  TNK #2 Will repeat angiogram of left leg and abdominal aorta tomorrow p.m. #3 plan exploration of infrarenal abdominal aorta on Friday, January 11 for direct thrombectomy and/or bypass #4 patient will be admitted to 2300 for thrombolysis   Tinnie Gens, MD 01/27/2011 2:34 PM

## 2011-01-27 NOTE — Consult Note (Signed)
CARDIOLOGY CONSULT NOTE  Patient ID: [name] MRN: [id number], DOB/AGE: 1970-11-23   Admit date: 01/27/2011 Date of Consult: 01/27/2011   Primary Physician: Alonza Bogus, MD, MD Primary Cardiologist: New  Pt. Profile:   41 y/o female w/o prior cardiac hx who presented to ED today with painful left foot and found on angiography to have thrombus in the infrarenal Ao and occlusions of the AT, PT, and Peroneal arteries, whom we've been asked to eval for pre-op clearance.  Problem List: Past Medical History  Diagnosis Date  . Diabetes mellitus     Since Age 60  . Shingles     11/2010  . Renal disorder     glomerulonephritis  . Cellulitis     of face - 12/2010  . Peripheral vascular disease in diabetes mellitus   . Hyperlipidemia   . Tobacco abuse     0.5 - 1ppd x 21 yrs    Past Surgical History  Procedure Date  . Wrist surgery     left  . Tubal ligation      Allergies:  Allergies  Allergen Reactions  . Aspirin     Cannot take this medication due to kidney disease  . Ibuprofen     Kidney    HPI:   41 y/o female with the above problem list.  Pt has no prior cardiac history.  She does not work but tries to remain active at home and is responsible for taking care of her aging mother who is bed bound.  Though she has no history of chest pain, she has noted that with higher levels of exertion, she experiences Dyspnea.  She thinks she can probably walk 8 city blocks prior to having to stop 2/2 dyspnea/fatigue.  She can also walk up 3 flights of stairs.  She denies any history of pnd, orthopnea, presyncope, or syncope.  This am, she awoke with left foot pain and noted that the extremity was cool.  She presented to the [facility] ED, where there was significant concern for occlusive dzs and pt was transferred to [facility] for Vascular surgical evaluation.  Pt underwent periph runoff and was noted to have thrombus in the infrarenal Ao along with occlusions of the ant tib and peroneal  arteries in the mid calf and the PT at the ankle.  She was initiated on streptokinase via infusion catheter into the left popliteal artery and is tentatively planned for repeat angiography of the left leg and abd ao tomorrow and subsequent exploration of the infrarenal Ao on 1/11 for thrombectomy and/or bypass.  We have been asked to eval for cardiac pre-op clearance.  Pt currently complains only of left foot pain.   Inpatient Medications:  Prior to Admission medications   Medication Sig Start Date End Date Taking? Authorizing Provider  acetaminophen (TYLENOL) 500 MG tablet Take 1,000 mg by mouth every 6 (six) hours as needed. For pain    Yes Historical Provider, MD  ALPRAZolam Duanne Moron) 1 MG tablet Take 1 mg by mouth daily as needed. Anxiety   Yes Historical Provider, MD  diphenhydrAMINE (BENADRYL) 25 MG tablet Take 25 mg by mouth at bedtime as needed. For sleep   Yes Historical Provider, MD  fish oil-omega-3 fatty acids 1000 MG capsule Take 1 g by mouth daily.    Yes Historical Provider, MD  furosemide (LASIX) 20 MG tablet Take 20 mg by mouth 3 (three) times a week. On Mondays, Wednesdays, and Fridays    Yes Historical Provider, MD  insulin aspart (NOVOLOG) 100 UNIT/ML injection Inject 10 Units into the skin 3 (three) times daily before meals. 01/11/11 01/11/12 Yes Alonza Bogus, MD  insulin glargine (LANTUS) 100 UNIT/ML injection Inject 30 Units into the skin at bedtime. 01/11/11 01/11/12 Yes Alonza Bogus, MD  losartan (COZAAR) 25 MG tablet Take 12.5 mg by mouth daily.     Yes Historical Provider, MD  Multiple Vitamins-Minerals (MULTIVITAMINS THER. W/MINERALS) TABS Take 1 tablet by mouth daily.     Yes Historical Provider, MD  potassium chloride SA (K-DUR,KLOR-CON) 20 MEQ tablet Take 0.5 tablets (10 mEq total) by mouth daily. 10/09/10 10/09/11 Yes Terry S. Olin Hauser, MD  sertraline (ZOLOFT) 50 MG tablet Take 50 mg by mouth daily.     Yes Historical Provider, MD     Family History  Problem  Relation Age of Onset  . COPD Mother     alive - 61  . Hypertension Mother   . Diabetes Father     alive - 71  . Hypertension Father   . Diabetes Mother   . Stroke Mother   . Coronary artery disease Mother   . Coronary artery disease Father      History   Social History  . Marital Status: Single    Spouse Name: N/A    Number of Children: N/A  . Years of Education: N/A   Occupational History  . Not on file.   Social History Main Topics  . Smoking status: Current Everyday Smoker -- 1.0 packs/day for 21 years    Types: Cigarettes  . Smokeless tobacco: Never Used  . Alcohol Use: No  . Drug Use: 1 per week    Special: Marijuana     was using monthly - last used 1 month ago.  Marland Kitchen Sexually Active: Yes    Birth Control/ Protection: Surgical   Other Topics Concern  . Not on file   Social History Narrative   Unemployed.  Lives in [place] with [place], [place], and mother.  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     Review of Systems: General: negative for chills, fever, night sweats or weight changes.  Cardiovascular: negative for chest pain, +++ dyspnea on exertion as outlined above.  No edema, orthopnea, palpitations, paroxysmal nocturnal dyspnea. Dermatological: negative for rash Respiratory: negative for cough or wheezing Urologic: negative for hematuria Abdominal: negative for nausea, vomiting, diarrhea, bright red blood per rectum, melena, or hematemesis Neurologic: negative for visual changes, syncope, or dizziness Extremities:  Left foot pain and numbness. All other systems reviewed and are otherwise negative except as noted above.  Physical Exam: Blood pressure 112/74, pulse 93, temperature 98.4 F (36.9 C), temperature source Oral, resp. rate 20, height 5\' 1"  (1.549 m), weight 125 lb (56.7 kg), last menstrual period 01/12/2011, SpO2 98.00%.  General: Well developed, well nourished, in no acute distress. Head: Normocephalic, atraumatic, sclera non-icteric,  no xanthomas, nares are without discharge.  Neck: Supple without bruits or JVD. Lungs:  Resp regular and unlabored, CTA. Heart: RRR no s3, s4, or murmurs. Abdomen: Soft, non-tender, non-distended, BS + x 4.  Msk:  Strength and tone appears normal for age. Extremities: No clubbing, cyanosis or edema.  Distal lower extremities cool to touch.  Pt 2+ left.  dimished dp.  Pulses easily palpable on right.  Left foot slightly mottled and cool to touch. Neuro: Alert and oriented X 3. Moves all extremities spontaneously. Psych: Normal affect.   Labs:   Results for orders placed during the hospital  encounter of 01/27/11 (from the past 72 hour(s))  BASIC METABOLIC PANEL     Status: Abnormal   Collection Time   01/27/11 10:33 AM      Component Value Range Comment   Sodium 131 (*) 135 - 145 (mEq/L)    Potassium 3.3 (*) 3.5 - 5.1 (mEq/L)    Chloride 98  96 - 112 (mEq/L)    CO2 27  19 - 32 (mEq/L)    Glucose, Bld 214 (*) 70 - 99 (mg/dL)    BUN 11  6 - 23 (mg/dL)    Creatinine, Ser 0.74  0.50 - 1.10 (mg/dL)    Calcium 8.8  8.4 - 10.5 (mg/dL)    GFR calc non Af Amer >90  >90 (mL/min)    GFR calc Af Amer >90  >90 (mL/min)   CBC     Status: Abnormal   Collection Time   01/27/11 10:33 AM      Component Value Range Comment   WBC 13.0 (*) 4.0 - 10.5 (K/uL)    RBC 4.20  3.87 - 5.11 (MIL/uL)    Hemoglobin 12.6  12.0 - 15.0 (g/dL)    HCT 37.2  36.0 - 46.0 (%)    MCV 88.6  78.0 - 100.0 (fL)    MCH 30.0  26.0 - 34.0 (pg)    MCHC 33.9  30.0 - 36.0 (g/dL)    RDW 15.3  11.5 - 15.5 (%)    Platelets 324  150 - 400 (K/uL)   DIFFERENTIAL     Status: Abnormal   Collection Time   01/27/11 10:33 AM      Component Value Range Comment   Neutrophils Relative 62  43 - 77 (%)    Neutro Abs 8.0 (*) 1.7 - 7.7 (K/uL)    Lymphocytes Relative 30  12 - 46 (%)    Lymphs Abs 3.9  0.7 - 4.0 (K/uL)    Monocytes Relative 7  3 - 12 (%)    Monocytes Absolute 0.9  0.1 - 1.0 (K/uL)    Eosinophils Relative 1  0 - 5 (%)     Eosinophils Absolute 0.1  0.0 - 0.7 (K/uL)    Basophils Relative 0  0 - 1 (%)    Basophils Absolute 0.0  0.0 - 0.1 (K/uL)   PROTIME-INR     Status: Normal   Collection Time   01/27/11 10:33 AM      Component Value Range Comment   Prothrombin Time 12.5  11.6 - 15.2 (seconds)    INR 0.91  0.00 - 1.49    APTT     Status: Normal   Collection Time   01/27/11 10:33 AM      Component Value Range Comment   aPTT 25  24 - 37 (seconds)   GLUCOSE, CAPILLARY     Status: Abnormal   Collection Time   01/27/11  2:37 PM      Component Value Range Comment   Glucose-Capillary 214 (*) 70 - 99 (mg/dL)     Radiology/Studies: Dg Chest Portable 1 View  01/27/2011  *RADIOLOGY REPORT*  Clinical Data: Left lower extremity pain, no pulses, hypertension  PORTABLE CHEST - 1 VIEW  Comparison: Portable exam 1029 hours compared to 03/21/2010  Findings: Normal heart size, mediastinal contours, and pulmonary vascularity. Mild peribronchial thickening. No pulmonary infiltrate, pleural effusion, or pneumothorax. Bones unremarkable.  IMPRESSION: Minimal chronic bronchitic changes.  Original Report Authenticated By: Burnetta Sabin, M.D.    EKG:  Pending.  ECG from  10/2010 - sinus tach 117, poor r wave progression, lad, lafb.  ASSESSMENT AND PLAN:   1.  PVD:  Pt presents with left foot pain and now found on angiography to have thrombus in the infrarenal Ao and embolization to the the distal left lower ext circulation.  She is currently receiving streptokinase with plan for repeat angio and thrombectomy +/- lower ext bypass on Friday 1/11.  From a cardiac standpoint, she is at least moderate to high risk for a periop MI based on her history and finding of PVD.  However, she is reasonably functional at home without chest pain or significant activity limiting dyspnea.  This being the case, we will f/u ecg and check a 2 d echo.  Provided that EF is wnl, rec that she proceed to the OR w/o further cardiac diagnostic testing.  If  however EF is reduced, we would plan to perform diagnostic cardiac cath.  With elevated HR, will add low-dose BB.  Will add high dose statin.  2.  DM:  Cont home meds.  3.  Tob Abuse:  Cessation advised.  Will need formal counseling.   Signed, Murray Hodgkins, NP 01/27/2011, 4:05 PM   Patient seen and examined with Ignacia Bayley, NP. We discussed all aspects of the encounter. I agree with the assessment and plan as stated above.    Very difficult case. Given her risk factors and ECG she almost certainly has underlying CAD. However functional capacity is not too bad. She appears to be at high risk of losing her leg if she does not have surgery soon. Overall I think she is at moderate to high (but not prohibitive) risk for serious peri-op cardiac complications. At this point would recommend 2-D echo (to look at [facility] and possible cardiac source of embolus). If echo shows significant LV dysfunction would recommend cath tomorrow to exclude high-grade proximal CAD. If EF is normal, I think we should proceed with surgery given high risk of limb loss with knowledge that surgery will be of considerable cardiac risk to her.  Agree with heparin and b-block (as BP tolerates). Has real ASA allergy so cannot use that. No Plavix due to need for surgery.   Discussed at length with patient. Will follow with you.  Daniel Bensimhon,MD 4:12 PM

## 2011-01-27 NOTE — Progress Notes (Signed)
1020 Seen and examined patient who will be transferred to [facility] for evaluation by Dr. Kellie Simmering, vascular surgeon. Patient presents with left leg and foot pain. Toes are dusky, slow cap refill, absent DP and positive doppler PT. Blotchy cold foot to palpation. Right foot cool to touch, Great toe and 2nd toe dusky with poor cap refill. DP not palpable. PT avaiable with doppler. Patient is a smoker.  Medical screening examination/treatment/procedure(s) were conducted as a shared visit with non-physician practitioner(s) and myself.  I personally evaluated the patient during the encounter

## 2011-01-28 ENCOUNTER — Encounter (HOSPITAL_COMMUNITY)
Admission: EM | Disposition: A | Discharge status: Home / Self Care | Payer: Self-pay | Source: Home / Self Care | Attending: Vascular Surgery

## 2011-01-28 DIAGNOSIS — R9431 Abnormal electrocardiogram [ECG] [EKG]: Secondary | ICD-10-CM

## 2011-01-28 DIAGNOSIS — I517 Cardiomegaly: Secondary | ICD-10-CM

## 2011-01-28 HISTORY — PX: LOWER EXTREMITY ANGIOGRAM: SHX5508

## 2011-01-28 LAB — URINALYSIS, ROUTINE W REFLEX MICROSCOPIC
Bilirubin Urine: NEGATIVE
Glucose, UA: >1000 mg/dL — AB
Ketones, ur: NEGATIVE mg/dL
Leukocytes, UA: NEGATIVE
Nitrite: NEGATIVE
Protein, ur: >300 mg/dL — AB
Specific Gravity, Urine: >1.046 — ABNORMAL HIGH (ref 1.005–1.030)
Urobilinogen, UA: 0.2 mg/dL (ref 0.0–1.0)
pH: 6 (ref 5.0–8.0)

## 2011-01-28 LAB — BASIC METABOLIC PANEL
BUN: 12 mg/dL (ref 6–23)
CO2: 25 mEq/L (ref 19–32)
Calcium: 7.9 mg/dL — ABNORMAL LOW (ref 8.4–10.5)
Chloride: 101 mEq/L (ref 96–112)
Creatinine, Ser: 1 mg/dL (ref 0.50–1.10)
GFR calc Af Amer: 80 mL/min — ABNORMAL LOW (ref >90)
GFR calc non Af Amer: 69 mL/min — ABNORMAL LOW (ref >90)
Glucose, Bld: 99 mg/dL (ref 70–99)
Potassium: 3.7 mEq/L (ref 3.5–5.1)
Sodium: 132 mEq/L — ABNORMAL LOW (ref 135–145)

## 2011-01-28 LAB — HEPARIN LEVEL (UNFRACTIONATED): Heparin Unfractionated: <0.1 IU/mL — ABNORMAL LOW (ref 0.30–0.70)

## 2011-01-28 LAB — CBC
HCT: 34.8 % — ABNORMAL LOW (ref 36.0–46.0)
Hemoglobin: 11.6 g/dL — ABNORMAL LOW (ref 12.0–15.0)
MCH: 30 pg (ref 26.0–34.0)
MCHC: 33.3 g/dL (ref 30.0–36.0)
MCV: 89.9 fL (ref 78.0–100.0)
Platelets: 273 10*3/uL (ref 150–400)
RBC: 3.87 MIL/uL (ref 3.87–5.11)
RDW: 15.6 % — ABNORMAL HIGH (ref 11.5–15.5)
WBC: 10.6 10*3/uL — ABNORMAL HIGH (ref 4.0–10.5)

## 2011-01-28 LAB — URINE MICROSCOPIC-ADD ON

## 2011-01-28 LAB — FIBRINOGEN
Fibrinogen: 635 mg/dL — ABNORMAL HIGH (ref 204–475)
Fibrinogen: 627 mg/dL — ABNORMAL HIGH (ref 204–475)

## 2011-01-28 LAB — GLUCOSE, CAPILLARY
Glucose-Capillary: 127 mg/dL — ABNORMAL HIGH (ref 70–99)
Glucose-Capillary: 152 mg/dL — ABNORMAL HIGH (ref 70–99)
Glucose-Capillary: 208 mg/dL — ABNORMAL HIGH (ref 70–99)
Glucose-Capillary: 353 mg/dL — ABNORMAL HIGH (ref 70–99)
Glucose-Capillary: 79 mg/dL (ref 70–99)
Glucose-Capillary: 91 mg/dL (ref 70–99)

## 2011-01-28 LAB — PREGNANCY, URINE: Preg Test, Ur: NEGATIVE

## 2011-01-28 SURGERY — ANGIOGRAM, LOWER EXTREMITY
Anesthesia: LOCAL

## 2011-01-28 MED ORDER — TENECTEPLASE 50 MG IV KIT
0.2500 mg/h | PACK | INTRAVENOUS | Status: DC
  Filled 2011-01-28: qty 1

## 2011-01-28 MED ORDER — NALOXONE HCL 0.4 MG/ML IJ SOLN: 0.4000 mg | INTRAMUSCULAR | Status: DC | PRN

## 2011-01-28 MED ORDER — DIPHENHYDRAMINE HCL 12.5 MG/5ML PO ELIX
12.5000 mg | ORAL_SOLUTION | Freq: Four times a day (QID) | ORAL | Status: DC | PRN
  Filled 2011-01-28: qty 5

## 2011-01-28 MED ORDER — LACTATED RINGERS IV SOLN: INTRAVENOUS | Status: DC

## 2011-01-28 MED ORDER — LIDOCAINE HCL (PF) 1 % IJ SOLN
INTRAMUSCULAR | Status: AC
  Filled 2011-01-28: qty 30

## 2011-01-28 MED ORDER — HYDROMORPHONE 0.3 MG/ML IV SOLN
INTRAVENOUS | Status: DC
  Administered 2011-01-28 – 2011-01-29 (×2): via INTRAVENOUS
  Administered 2011-01-29: 4.19 mg via INTRAVENOUS
  Administered 2011-01-29: 1.5 mg via INTRAVENOUS
  Administered 2011-01-29: 05:00:00 via INTRAVENOUS
  Administered 2011-01-29: 3 mg via INTRAVENOUS
  Administered 2011-01-30: 1.98 mg via INTRAVENOUS
  Administered 2011-01-30: 2.1 mg via INTRAVENOUS
  Filled 2011-01-28 (×5): qty 25

## 2011-01-28 MED ORDER — TENECTEPLASE 50 MG IV KIT
0.4000 mg/h | PACK | INTRAVENOUS | Status: DC
  Administered 2011-01-28: 0.5 mg/h via INTRAVENOUS

## 2011-01-28 MED ORDER — FENTANYL CITRATE 0.05 MG/ML IJ SOLN
INTRAMUSCULAR | Status: AC
  Filled 2011-01-28: qty 2

## 2011-01-28 MED ORDER — TENECTEPLASE 50 MG IV KIT
0.5000 mg/h | PACK | INTRAVENOUS | Status: DC
  Administered 2011-01-28: 0.5 mg/h via INTRAVENOUS
  Filled 2011-01-28 (×2): qty 1

## 2011-01-28 MED ORDER — TENECTEPLASE 50 MG IV KIT
PACK | INTRAVENOUS | Status: DC
  Filled 2011-01-28: qty 1000

## 2011-01-28 MED ORDER — SODIUM CHLORIDE 0.9 % IJ SOLN: 9.0000 mL | INTRAMUSCULAR | Status: DC | PRN

## 2011-01-28 MED ORDER — ONDANSETRON HCL 4 MG/2ML IJ SOLN: 4.0000 mg | Freq: Four times a day (QID) | INTRAMUSCULAR | Status: DC | PRN

## 2011-01-28 MED ORDER — HEPARIN (PORCINE) IN NACL 2-0.9 UNIT/ML-% IJ SOLN
INTRAMUSCULAR | Status: AC
  Filled 2011-01-28: qty 1000

## 2011-01-28 MED ORDER — ALPRAZOLAM 0.5 MG PO TABS
0.5000 mg | ORAL_TABLET | ORAL | Status: DC | PRN
  Administered 2011-01-28 – 2011-02-04 (×8): 0.5 mg via ORAL
  Filled 2011-01-28 (×8): qty 1

## 2011-01-28 MED ORDER — CEFAZOLIN SODIUM 1-5 GM-% IV SOLN
INTRAVENOUS | Status: AC
  Filled 2011-01-28: qty 50

## 2011-01-28 MED ORDER — SODIUM CHLORIDE 0.9 % IV SOLN
INTRAVENOUS | Status: DC
  Filled 2011-01-28: qty 1000

## 2011-01-28 MED ORDER — TENECTEPLASE 50 MG IV KIT: 0.1000 mg/h | PACK | INTRAVENOUS | Status: DC

## 2011-01-28 MED ORDER — DIPHENHYDRAMINE HCL 50 MG/ML IJ SOLN: 12.5000 mg | Freq: Four times a day (QID) | INTRAMUSCULAR | Status: DC | PRN

## 2011-01-28 MED ORDER — SODIUM CHLORIDE 0.9 % IV SOLN
0.5000 mg/h | INTRAVENOUS | Status: DC
  Administered 2011-01-28: 0.5 mg/h via INTRAVENOUS
  Filled 2011-01-28: qty 1

## 2011-01-28 NOTE — Progress Notes (Signed)
Inpatient Diabetes Program Recommendations  AACE/ADA: New Consensus Statement on Inpatient Glycemic Control (2009)  Target Ranges:  Prepandial:   less than 140 mg/dL      Peak postprandial:   less than 180 mg/dL (1-2 hours)      Critically ill patients:  140 - 180 mg/dL   Reason for Visit: CBGs greater than 180 mg/dl  Inpatient Diabetes Program Recommendations Insulin - Basal: Start Lantus 15 units daily and titrate up as needed.  Probably has Type 1 diabetes and takes Lantus 30 units daily at home. Needs basal insulin.   Note: patient has Type 1 diabetes

## 2011-01-28 NOTE — Progress Notes (Signed)
[facility] [place]  Unfortunately, consent was not obtained from direct next of kin and patient has received significant amount of narcotics so cannot legally be consented.  Will proceed with procedure with signed consent from her cousin (at bedside) patient at risk of limb loss.  Re-imaging the left leg and aorta will help determine the next therapeutic intervention in this patient.  Adele Barthel, MD [facility] [place] Office: [phone] Pager: [phone]  01/28/2011, 1:21 PM

## 2011-01-28 NOTE — Progress Notes (Addendum)
[facility] [place]  Progress Note   Date of Surgery: 01/27/2011  Procedure(s): LOWER EXTREMITY ANGIOGRAM Surgeon: Surgeon(s): Rosetta Posner, MD  1 Day Post-Op  History of Present Illness  [name] is a 41 y.o. female who is S/P: LOWER EXTREMITY ANGIOGRAM with runoff.  The patient's pre-op symptoms of LLE pain  and coolness are Unchanged . Patients pain is not well controlled on 2mg  Dilaudid IV> Pt having some itching with pain meds.   Imaging: Dg Chest Portable 1 View  01/27/2011  *RADIOLOGY REPORT*  Clinical Data: Shortness of breath.  PORTABLE CHEST - 1 VIEW  Comparison: Chest 01/27/2011.  Findings: Lungs are clear.  Heart size is normal.  No pneumothorax or pleural effusion.  IMPRESSION: No acute disease.  Original Report Authenticated By: Arvid Right. D'ALESSIO, M.D.   Dg Chest Portable 1 View  01/27/2011  *RADIOLOGY REPORT*  Clinical Data: Left lower extremity pain, no pulses, hypertension  PORTABLE CHEST - 1 VIEW  Comparison: Portable exam 1029 hours compared to 03/21/2010  Findings: Normal heart size, mediastinal contours, and pulmonary vascularity. Mild peribronchial thickening. No pulmonary infiltrate, pleural effusion, or pneumothorax. Bones unremarkable.  IMPRESSION: Minimal chronic bronchitic changes.  Original Report Authenticated By: Burnetta Sabin, M.D.    Significant Diagnostic Studies:  Fibrinogen 635  CBC    Component Value Date/Time   WBC 10.6* 01/28/2011 0430   RBC 3.87 01/28/2011 0430   HGB 11.6* 01/28/2011 0430   HCT 34.8* 01/28/2011 0430   PLT 273 01/28/2011 0430   MCV 89.9 01/28/2011 0430   MCH 30.0 01/28/2011 0430   MCHC 33.3 01/28/2011 0430   RDW 15.6* 01/28/2011 0430   LYMPHSABS 3.9 01/27/2011 1033   MONOABS 0.9 01/27/2011 1033   EOSABS 0.1 01/27/2011 1033   BASOSABS 0.0 01/27/2011 1033    BMET    Component Value Date/Time   NA 132* 01/28/2011 0430   K 3.7 01/28/2011 0430   CL 101 01/28/2011 0430   CO2 25 01/28/2011 0430   GLUCOSE 99  01/28/2011 0430   BUN 12 01/28/2011 0430   CREATININE 1.00 01/28/2011 0430   CALCIUM 7.9* 01/28/2011 0430   GFRNONAA 69* 01/28/2011 0430   GFRAA 80* 01/28/2011 0430    COAG Lab Results  Component Value Date   INR 0.91 01/27/2011   INR 0.88 10/16/2010   INR 1.0 11/14/2007   No results found for this basename: PTT     Physical Examination  BP Readings from Last 3 Encounters:  01/28/11 87/54  01/28/11 87/54  01/28/11 87/54   Temp Readings from Last 3 Encounters:  01/28/11 99.3 F (37.4 C) Oral  01/28/11 99.3 F (37.4 C) Oral  01/28/11 99.3 F (37.4 C) Oral   SpO2 Readings from Last 3 Encounters:  01/28/11 93%  01/28/11 93%  01/28/11 93%   Pulse Readings from Last 3 Encounters:  01/28/11 101  01/28/11 101  01/28/11 101    Pt is A&O x 3 left lower extremity:Limb is cold and pale. Minimal movement of toes Painful to touch  Left Dorsalis Pedis pulse is absent LeftPosterior tibial pulse is  monophasic by Doppler  Assessment:  Pt still with significant symptoms in LLE VSS H/H Stable  Plan: Repeat angio today OR tomorrow Continue TNK and heparin as ordered  [name] [id number] 01/28/2011 7:46 AM        Patient has been on TNK. overnight for attempted thrombolysis of tibial vessels left leg. On physical exam she continues to have significant  pain lateral aspect left foot. The foot is pale and cool. She does have good Doppler flow in the left posterior tibial artery. She has some decreased motion in the distal left foot. There is no left calf tenderness. She has 3+ femoral pulses bilaterally. She continues on heparin 1000 units per hour +50 cc per hour at TNK. Plan is to continue attempts at thrombolyzes today and repeat angiogram left leg later this afternoon. Will then resume thrombolysis overnight and discontinue at 4 AM. Plan exploration of abdominal aorta tomorrow to remove thrombus and perform bypass if indicated. Have discussed this at length with  patient and the fact that surgical procedure will not help the pain in the left leg. Hopefully at thrombolysis will improve the situation. She still is at jeopardy of left leg limb loss.

## 2011-01-28 NOTE — Progress Notes (Signed)
Utilization review completed. Rozanna Boer, RN, BSN. 01/28/11

## 2011-01-28 NOTE — Op Note (Signed)
OPERATIVE NOTE   PROCEDURE: 1.  Left leg angiogram via previously placed left end-hole catheter 2.  Aortogram 3.  Third order arterial selection 4.  Placement of thrombolytic catheter in left femoropopliteal artery 5.  Infusion of thrombolytic agent intra-arterial   PRE-OPERATIVE DIAGNOSIS: Aortic thrombus, left leg embolus  POST-OPERATIVE DIAGNOSIS: same as above   SURGEON: Adele Barthel, MD  ANESTHESIA: conscious sedation  ESTIMATED BLOOD LOSS: 50 cc  CONTRAST: 165 cc  FINDING(S):  Aorta: thrombus   Right Left  RA Patent Patent  CIA Patent Patent  EIA Patent Patent  IIA Patent Patent  CFA Patent Patent  SFA Patent Patent, non-occluding thrombi evident  PFA Patent Patent  Pop Patent Patent  Trif Patent Patent  AT Patent Occluded  Pero No visualized Occluded  PT Patent Occludes at level of ankle  Foot Flow visualized Minimal flow into left foot   SPECIMEN(S):  none  INDICATIONS:   [name] is a 41 y.o. female who presents with left leg ischemia.  She underwent diagnostic aortogram and bilateral leg runoff which demonstrated aortic thrombus and left tibial embolism.  It was felt by Dr. Sandra Cockayne she should undergo 24-36 hours of thrombolysis of the left tibial arteries prior completing her aortic thrombectomy tomorrow.  She returns back to the angiographic suite today to re-image her legs and her aorta.  I discussed with the patient the nature of angiographic procedures, especially the limited patencies of any endovascular intervention.  The patient is aware of that the risks of an angiographic procedure include but are not limited to: bleeding, infection, access site complications, renal failure, embolization, rupture of vessel, dissection, possible need for emergent surgical intervention, possible need for surgical procedures to treat the patient's pathology, and stroke and death.  The patient is aware of the risks and agrees to proceed.  DESCRIPTION: After full  informed consent was obtained from the patient, the patient was brought back to the angiography suite.  The patient was placed supine upon the angiography table and connected to monitoring equipment.  The patient was prepped and draped in the standard fashion for an angiographic procedure.  The previously placed end-hole catheter was aspirated: no clot was obtained.   The catheter was pulled back into the left external iliac artery.  An automated left leg runoff was completed.  Unfortunately, the patient moved in the middle of the runoff, degrading the quality of the images.  The end-hole catheter was removed and the [facility] wire was placed through the right femoral sheath up into the aorta.  The sheath was then exchanged for a new sheath.  All contaminated drapes, gloves, and equipment were removed.  A Omniflush catheter was loaded and placed into the distal descending thoracic aorta.  The catheter was connected to the power injector circuit, after performing an de-airring and de-clotting maneuver.  A power injector aortogram was completed.  Based on the aortogram, some of the thrombus appears to have migrated.  The [facility] wire was replaced in the catheter, and using the [employer] and Crossover catheter, the left common iliac artery was selected.  The catheter and wire were advanced into the left common femoral artery.  The sheath was exchanged for a 6-Fr Destination sheath, which lodged into the left common femoral artery.  I then completed a repeat left leg runoff in stations on the left side.  Based on the repeat imaging, she needs continued thrombolysis.  Her distal posterior tibial artery is miniscule and unlikely to respond well to surgical  thrombectomy.  Using a Glidewire, I selected out the left popliteal artery.  I placed a Pulse-spray catheter down into the below-the-knee popliteal artery artery, and then placed the central occluding wire.  I connected the TNK-ase drip to both the sheath and the infusion  catheter and ran then both at 0.5 mg/hr for 30 minutes, for a 0.5 mg bolus into the left femoropopliteal segment.  I then reduced the dose of TNK-ase to 0.25 mg/hr for both the infusion catheter and sheath.    The right sheath and catheter were secured in place with sutures.  The catheter and sheath were sterilely draped.  Heparin was continued via a peripheral IV at 500 units/hr.  COMPLICATIONS: none  CONDITION: stable   Adele Barthel, MD [facility] [place] Office: [phone] Pager: [phone]  01/28/2011, 3:37 PM

## 2011-01-28 NOTE — Interval H&P Note (Signed)
--    [facility] [place]  History and Physical Update  The patient was interviewed and re-examined.  The patient's History and Physical has been reviewed and is unchanged for thrombolysis overnight.  Today's procedure is a repeat left angiogram and aortogram to check on status of left leg and aortogram.  Adele Barthel, MD [facility] Office: [phone] Pager: [phone]  01/28/2011, 1:09 PM

## 2011-01-28 NOTE — Progress Notes (Signed)
   Subjective:  No chest pain.  Continues to complain of left leg pain.  Rhythm stable sinus tachycardia. TNKase and IV heparin infusions in place. 2D echo not yet done.  Objective:  Vital Signs in the last 24 hours: Temp:  [98.4 F (36.9 C)-100.2 F (37.9 C)] 99.3 F (37.4 C) (01/10 0700) Pulse Rate:  [87-115] 101  (01/10 0700) Resp:  [8-22] 15  (01/10 0700) BP: (82-148)/(44-98) 87/54 mmHg (01/10 0700) SpO2:  [93 %-99 %] 93 % (01/10 0700) Weight:  [125 lb (56.7 kg)] 125 lb (56.7 kg) (01/09 0945)  Intake/Output from previous day: 01/09 0701 - 01/10 0700 In: 1630 [P.O.:240; I.V.:740; IV Piggyback:650] Out: 300 [Urine:300] Intake/Output from this shift:       . famotidine (PEPCID) IV  20 mg Intravenous Q12H  . heparin      . heparin      . HYDROmorphone      . HYDROmorphone      . HYDROmorphone  1 mg Intravenous Once  . HYDROmorphone  1 mg Intravenous Once  .  HYDROmorphone (DILAUDID) injection  1 mg Intravenous Once  . insulin aspart  0-15 Units Subcutaneous Q4H  . lidocaine      . metoprolol tartrate  12.5 mg Oral BID  . ondansetron  4 mg Intravenous Once  . potassium chloride  20-40 mEq Oral Once  . promethazine  12.5 mg Intravenous To Major  . rosuvastatin  40 mg Oral q1800  . sodium chloride  3 mL Intravenous Q12H      . sodium chloride 50 mL/hr (01/27/11 2249)  . heparin 5 mL/hr (01/27/11 2200)  . heparin 5 mL/hr (01/28/11 0600)  . tenecteplase (TNKase) infusion 0.5 mg/hr (01/28/11 [id number])  . DISCONTD: heparin    . DISCONTD: heparin    . DISCONTD: heparin 500 Units/hr (01/27/11 1517)  . DISCONTD: tenecteplase (TNKase) infusion 0.5 mg/hr (01/28/11 0038)    Physical Exam: The patient appears to be in moderate distress.  Chest clear. Heart reveals no gallop.  Left lower limb cool.   Lab Results:  Basename 01/28/11 0430 01/27/11 1033  WBC 10.6* 13.0*  HGB 11.6* 12.6  PLT 273 324    Basename 01/28/11 0430 01/27/11 1033  NA 132* 131*  K 3.7 3.3*  CL  101 98  CO2 25 27  GLUCOSE 99 214*  BUN 12 11  CREATININE 1.00 0.74   No results found for this basename: TROPONINI:2,CK,MB:2 in the last 72 hours Hepatic Function Panel No results found for this basename: PROT,ALBUMIN,AST,ALT,ALKPHOS,BILITOT,BILIDIR,IBILI in the last 72 hours No results found for this basename: CHOL in the last 72 hours No results found for this basename: PROTIME in the last 72 hours  Imaging: Imaging results have been reviewed  Cardiac Studies:  Assessment/Plan:   Severe left lower leg ischemia. Sinus tachycardia.  Plan: Continue current tthrombolytic therapy. Review 2 D echo when available.  LOS: 1 day    Darlin Coco 01/28/2011, 9:27 AM

## 2011-01-29 ENCOUNTER — Inpatient Hospital Stay (HOSPITAL_COMMUNITY): Payer: Medicaid Other

## 2011-01-29 ENCOUNTER — Inpatient Hospital Stay (HOSPITAL_COMMUNITY): Payer: Medicaid Other | Admitting: Anesthesiology

## 2011-01-29 ENCOUNTER — Other Ambulatory Visit: Payer: Self-pay | Admitting: Vascular Surgery

## 2011-01-29 ENCOUNTER — Encounter (HOSPITAL_COMMUNITY)
Admission: EM | Disposition: A | Discharge status: Home / Self Care | Payer: Self-pay | Source: Home / Self Care | Attending: Vascular Surgery

## 2011-01-29 ENCOUNTER — Encounter (HOSPITAL_COMMUNITY): Payer: Self-pay | Admitting: Anesthesiology

## 2011-01-29 HISTORY — PX: LOWER EXTREMITY ANGIOGRAM: SHX5508

## 2011-01-29 HISTORY — PX: EMBOLECTOMY: SHX44

## 2011-01-29 LAB — BASIC METABOLIC PANEL
BUN: 16 mg/dL (ref 6–23)
CO2: 9 mEq/L — CL (ref 19–32)
Calcium: 8 mg/dL — ABNORMAL LOW (ref 8.4–10.5)
Chloride: 99 mEq/L (ref 96–112)
Creatinine, Ser: 0.83 mg/dL (ref 0.50–1.10)
GFR calc Af Amer: >90 mL/min (ref >90)
GFR calc non Af Amer: 86 mL/min — ABNORMAL LOW (ref >90)
Glucose, Bld: 298 mg/dL — ABNORMAL HIGH (ref 70–99)
Potassium: 4.3 mEq/L (ref 3.5–5.1)
Sodium: 130 mEq/L — ABNORMAL LOW (ref 135–145)

## 2011-01-29 LAB — CBC
HCT: 29 % — ABNORMAL LOW (ref 36.0–46.0)
HCT: 33.1 % — ABNORMAL LOW (ref 36.0–46.0)
Hemoglobin: 9.7 g/dL — ABNORMAL LOW (ref 12.0–15.0)
Hemoglobin: 11.6 g/dL — ABNORMAL LOW (ref 12.0–15.0)
MCH: 29.9 pg (ref 26.0–34.0)
MCH: 30.6 pg (ref 26.0–34.0)
MCHC: 33.4 g/dL (ref 30.0–36.0)
MCHC: 35 g/dL (ref 30.0–36.0)
MCV: 89.5 fL (ref 78.0–100.0)
MCV: 87.3 fL (ref 78.0–100.0)
Platelets: 206 10*3/uL (ref 150–400)
Platelets: 178 10*3/uL (ref 150–400)
RBC: 3.24 MIL/uL — ABNORMAL LOW (ref 3.87–5.11)
RBC: 3.79 MIL/uL — ABNORMAL LOW (ref 3.87–5.11)
RDW: 15.4 % (ref 11.5–15.5)
RDW: 15.7 % — ABNORMAL HIGH (ref 11.5–15.5)
WBC: 12.3 10*3/uL — ABNORMAL HIGH (ref 4.0–10.5)
WBC: 14.1 10*3/uL — ABNORMAL HIGH (ref 4.0–10.5)

## 2011-01-29 LAB — APTT: aPTT: 32 seconds (ref 24–37)

## 2011-01-29 LAB — GLUCOSE, CAPILLARY
Glucose-Capillary: 118 mg/dL — ABNORMAL HIGH (ref 70–99)
Glucose-Capillary: 209 mg/dL — ABNORMAL HIGH (ref 70–99)
Glucose-Capillary: 267 mg/dL — ABNORMAL HIGH (ref 70–99)
Glucose-Capillary: 323 mg/dL — ABNORMAL HIGH (ref 70–99)

## 2011-01-29 LAB — PROTIME-INR
INR: 1.01 (ref 0.00–1.49)
Prothrombin Time: 13.5 seconds (ref 11.6–15.2)

## 2011-01-29 LAB — POCT I-STAT GLUCOSE
Glucose, Bld: 186 mg/dL — ABNORMAL HIGH (ref 70–99)
Operator id: 125961

## 2011-01-29 LAB — POCT ACTIVATED CLOTTING TIME: Activated Clotting Time: 111 seconds

## 2011-01-29 SURGERY — EMBOLECTOMY
Anesthesia: General | Site: Leg Lower | Laterality: Left | Wound class: Clean

## 2011-01-29 SURGERY — ANGIOGRAM, LOWER EXTREMITY
Anesthesia: Moderate Sedation | Laterality: Left

## 2011-01-29 MED ORDER — ONDANSETRON HCL 4 MG/2ML IJ SOLN: 4.0000 mg | Freq: Once | INTRAMUSCULAR | Status: DC | PRN

## 2011-01-29 MED ORDER — LABETALOL HCL 5 MG/ML IV SOLN
10.0000 mg | INTRAVENOUS | Status: DC | PRN
  Filled 2011-01-29: qty 4

## 2011-01-29 MED ORDER — NON FORMULARY: 2.0000 mg | Freq: Once | Status: DC

## 2011-01-29 MED ORDER — SODIUM CHLORIDE 0.9 % IV SOLN
INTRAVENOUS | Status: DC
  Administered 2011-01-29 – 2011-01-30 (×2): via INTRAVENOUS
  Administered 2011-01-31: 1000 mL via INTRAVENOUS

## 2011-01-29 MED ORDER — FUROSEMIDE 20 MG PO TABS
20.0000 mg | ORAL_TABLET | ORAL | Status: DC
  Administered 2011-02-01 – 2011-02-03 (×2): 20 mg via ORAL
  Filled 2011-01-29 (×4): qty 1

## 2011-01-29 MED ORDER — METOPROLOL TARTRATE 1 MG/ML IV SOLN: 2.0000 mg | INTRAVENOUS | Status: DC | PRN

## 2011-01-29 MED ORDER — ACETAMINOPHEN 650 MG RE SUPP: 325.0000 mg | RECTAL | Status: DC | PRN

## 2011-01-29 MED ORDER — CEFUROXIME SODIUM 1.5 G IJ SOLR
INTRAMUSCULAR | Status: AC
  Filled 2011-01-29: qty 1.5

## 2011-01-29 MED ORDER — GLYCOPYRROLATE 0.2 MG/ML IJ SOLN
INTRAMUSCULAR | Status: DC | PRN
  Administered 2011-01-29: .4 mg via INTRAVENOUS

## 2011-01-29 MED ORDER — FENTANYL CITRATE 0.05 MG/ML IJ SOLN: 50.0000 ug | INTRAMUSCULAR | Status: DC | PRN

## 2011-01-29 MED ORDER — ALBUMIN HUMAN 5 % IV SOLN
INTRAVENOUS | Status: DC | PRN
  Administered 2011-01-29: 11:00:00 via INTRAVENOUS

## 2011-01-29 MED ORDER — TENECTEPLASE 50 MG IV KIT: 0.2500 mg/h | PACK | INTRAVENOUS | Status: DC

## 2011-01-29 MED ORDER — NEOSTIGMINE METHYLSULFATE 1 MG/ML IJ SOLN
INTRAMUSCULAR | Status: DC | PRN
  Administered 2011-01-29: 3 mg via INTRAVENOUS

## 2011-01-29 MED ORDER — FENTANYL CITRATE 0.05 MG/ML IJ SOLN
INTRAMUSCULAR | Status: DC | PRN
  Administered 2011-01-29: 150 ug via INTRAVENOUS
  Administered 2011-01-29 (×2): 25 ug via INTRAVENOUS

## 2011-01-29 MED ORDER — DEXTROSE 5 % IV SOLN
INTRAVENOUS | Status: AC
  Filled 2011-01-29: qty 50

## 2011-01-29 MED ORDER — LIDOCAINE HCL (PF) 1 % IJ SOLN
INTRAMUSCULAR | Status: AC
  Filled 2011-01-29: qty 30

## 2011-01-29 MED ORDER — DOPAMINE-DEXTROSE 3.2-5 MG/ML-% IV SOLN: 3.0000 ug/kg/min | INTRAVENOUS | Status: DC

## 2011-01-29 MED ORDER — HETASTARCH-ELECTROLYTES 6 % IV SOLN
500.0000 mL | Freq: Once | INTRAVENOUS | Status: AC
  Administered 2011-01-29: 500 mL via INTRAVENOUS
  Filled 2011-01-29 (×2): qty 500

## 2011-01-29 MED ORDER — LOSARTAN POTASSIUM 25 MG PO TABS
12.5000 mg | ORAL_TABLET | Freq: Every day | ORAL | Status: DC
  Administered 2011-01-30 – 2011-01-31 (×2): 12.5 mg via ORAL
  Administered 2011-02-01: 09:00:00 via ORAL
  Administered 2011-02-02: 12.5 mg via ORAL
  Administered 2011-02-03: 11:00:00 via ORAL
  Filled 2011-01-29 (×6): qty 0.5

## 2011-01-29 MED ORDER — ALPRAZOLAM 0.5 MG PO TABS: 1.0000 mg | ORAL_TABLET | Freq: Every day | ORAL | Status: DC | PRN

## 2011-01-29 MED ORDER — DEXTROSE 5 % IV SOLN
1.5000 g | INTRAVENOUS | Status: DC | PRN
  Administered 2011-01-29: 1.5 g via INTRAVENOUS

## 2011-01-29 MED ORDER — HEPARIN SOD (PORCINE) IN D5W 100 UNIT/ML IV SOLN
1650.0000 [IU]/h | INTRAVENOUS | Status: DC
  Administered 2011-01-29: 800 [IU]/h via INTRAVENOUS
  Administered 2011-01-30 (×2): 1450 [IU]/h via INTRAVENOUS
  Filled 2011-01-29 (×6): qty 250

## 2011-01-29 MED ORDER — HEPARIN SODIUM (PORCINE) 1000 UNIT/ML IJ SOLN
INTRAMUSCULAR | Status: DC | PRN
  Administered 2011-01-29: 5000 [IU] via INTRAVENOUS

## 2011-01-29 MED ORDER — ACETAMINOPHEN 325 MG PO TABS
325.0000 mg | ORAL_TABLET | ORAL | Status: DC | PRN
  Administered 2011-02-01 – 2011-02-04 (×2): 650 mg via ORAL
  Filled 2011-01-29 (×2): qty 2

## 2011-01-29 MED ORDER — SODIUM CHLORIDE 0.9 % IR SOLN
Status: DC | PRN
  Administered 2011-01-29: 11:00:00

## 2011-01-29 MED ORDER — IOHEXOL 300 MG/ML  SOLN
INTRAMUSCULAR | Status: DC | PRN
  Administered 2011-01-29: 30 mL via INTRAVENOUS

## 2011-01-29 MED ORDER — POTASSIUM CHLORIDE CRYS ER 20 MEQ PO TBCR: 20.0000 meq | EXTENDED_RELEASE_TABLET | Freq: Once | ORAL | Status: AC | PRN

## 2011-01-29 MED ORDER — TENECTEPLASE 50 MG IV KIT
PACK | INTRAVENOUS | Status: DC | PRN
  Administered 2011-01-29: 2 mg via INTRAVENOUS

## 2011-01-29 MED ORDER — PHENYLEPHRINE HCL 10 MG/ML IJ SOLN
10.0000 mg | INTRAVENOUS | Status: DC | PRN
  Administered 2011-01-29: 20 ug/min via INTRAVENOUS

## 2011-01-29 MED ORDER — HYDRALAZINE HCL 20 MG/ML IJ SOLN
10.0000 mg | INTRAMUSCULAR | Status: DC | PRN
  Filled 2011-01-29: qty 0.5

## 2011-01-29 MED ORDER — SERTRALINE HCL 50 MG PO TABS
50.0000 mg | ORAL_TABLET | Freq: Every day | ORAL | Status: DC
  Administered 2011-01-30 – 2011-02-04 (×6): 50 mg via ORAL
  Filled 2011-01-29 (×6): qty 1

## 2011-01-29 MED ORDER — HEPARIN (PORCINE) IN NACL 2-0.9 UNIT/ML-% IJ SOLN
INTRAMUSCULAR | Status: AC
  Filled 2011-01-29: qty 1000

## 2011-01-29 MED ORDER — 0.9 % SODIUM CHLORIDE (POUR BTL) OPTIME
TOPICAL | Status: DC | PRN
  Administered 2011-01-29: 1000 mL

## 2011-01-29 MED ORDER — LIDOCAINE HCL (CARDIAC) 20 MG/ML IV SOLN
INTRAVENOUS | Status: DC | PRN
  Administered 2011-01-29: 100 mg via INTRAVENOUS

## 2011-01-29 MED ORDER — LACTATED RINGERS IV SOLN
INTRAVENOUS | Status: DC | PRN
  Administered 2011-01-29 (×2): via INTRAVENOUS

## 2011-01-29 MED ORDER — ROCURONIUM BROMIDE 100 MG/10ML IV SOLN
INTRAVENOUS | Status: DC | PRN
  Administered 2011-01-29: 30 mg via INTRAVENOUS
  Administered 2011-01-29 (×2): 10 mg via INTRAVENOUS

## 2011-01-29 MED ORDER — SODIUM CHLORIDE 0.9 % IV SOLN
INTRAVENOUS | Status: DC | PRN
  Administered 2011-01-29: 10:00:00 via INTRAVENOUS

## 2011-01-29 MED ORDER — INSULIN ASPART 100 UNIT/ML ~~LOC~~ SOLN
10.0000 [IU] | Freq: Three times a day (TID) | SUBCUTANEOUS | Status: DC
  Administered 2011-02-01 – 2011-02-02 (×2): 10 [IU] via SUBCUTANEOUS

## 2011-01-29 MED ORDER — EPHEDRINE SULFATE 50 MG/ML IJ SOLN
INTRAMUSCULAR | Status: DC | PRN
  Administered 2011-01-29: 10 mg via INTRAVENOUS

## 2011-01-29 MED ORDER — MIDAZOLAM HCL 2 MG/2ML IJ SOLN: 1.0000 mg | INTRAMUSCULAR | Status: DC | PRN

## 2011-01-29 MED ORDER — ONDANSETRON HCL 4 MG/2ML IJ SOLN
INTRAMUSCULAR | Status: DC | PRN
  Administered 2011-01-29: 4 mg via INTRAVENOUS

## 2011-01-29 MED ORDER — HYDROMORPHONE HCL PF 1 MG/ML IJ SOLN: 0.2500 mg | INTRAMUSCULAR | Status: DC | PRN

## 2011-01-29 MED ORDER — DOCUSATE SODIUM 100 MG PO CAPS
100.0000 mg | ORAL_CAPSULE | Freq: Every day | ORAL | Status: DC
  Administered 2011-01-30 – 2011-02-04 (×6): 100 mg via ORAL
  Filled 2011-01-29 (×6): qty 1

## 2011-01-29 MED ORDER — INSULIN GLARGINE 100 UNIT/ML ~~LOC~~ SOLN
30.0000 [IU] | Freq: Every day | SUBCUTANEOUS | Status: DC
  Administered 2011-01-30 – 2011-02-03 (×3): 30 [IU] via SUBCUTANEOUS
  Filled 2011-01-29 (×2): qty 3

## 2011-01-29 MED ORDER — LACTATED RINGERS IV SOLN: INTRAVENOUS | Status: DC

## 2011-01-29 MED ORDER — TENECTEPLASE 50 MG IV KIT
2.0000 mg | PACK | Freq: Once | INTRAVENOUS | Status: DC
  Filled 2011-01-29: qty 10

## 2011-01-29 MED ORDER — PROPOFOL 10 MG/ML IV EMUL
INTRAVENOUS | Status: DC | PRN
  Administered 2011-01-29: 80 mg via INTRAVENOUS

## 2011-01-29 MED ORDER — MIDAZOLAM HCL 5 MG/5ML IJ SOLN
INTRAMUSCULAR | Status: DC | PRN
  Administered 2011-01-29: 0.5 mg via INTRAVENOUS
  Administered 2011-01-29: 1 mg via INTRAVENOUS

## 2011-01-29 SURGICAL SUPPLY — 83 items
ADH SKN CLS LQ APL DERMABOND (GAUZE/BANDAGES/DRESSINGS) ×2
ARTERIAL EMBOLECTOMY CATHETER ×2 IMPLANT
ATTRACTOMAT 16X20 MAGNETIC DRP (DRAPES) ×3 IMPLANT
BAG ISL DRAPE 18X18 STRL (DRAPES) ×2
BAG ISOLATION DRAPE 18X18 (DRAPES) ×1 IMPLANT
CANISTER SUCTION 2500CC (MISCELLANEOUS) ×3 IMPLANT
CATH EMB 2FR 60CM (CATHETERS) ×2 IMPLANT
CATH EMB 3FR 80CM (CATHETERS) ×2 IMPLANT
CATH EMB 4FR 80CM (CATHETERS) ×2 IMPLANT
CLIP TI MEDIUM 24 (CLIP) ×3 IMPLANT
CLIP TI WIDE RED SMALL 24 (CLIP) ×3 IMPLANT
CLOTH BEACON ORANGE TIMEOUT ST (SAFETY) ×3 IMPLANT
CONT SPEC 4OZ CLIKSEAL STRL BL (MISCELLANEOUS) ×2 IMPLANT
COVER SURGICAL LIGHT HANDLE (MISCELLANEOUS) ×6 IMPLANT
DERMABOND ADHESIVE PROPEN (GAUZE/BANDAGES/DRESSINGS) ×1
DERMABOND ADVANCED .7 DNX6 (GAUZE/BANDAGES/DRESSINGS) ×1 IMPLANT
DRAIN CHANNEL 10M FLAT 3/4 FLT (DRAIN) ×2 IMPLANT
DRAPE ISOLATION BAG 18X18 (DRAPES) ×1
DRAPE WARM FLUID 44X44 (DRAPE) ×3 IMPLANT
DRSG COVADERM 4X8 (GAUZE/BANDAGES/DRESSINGS) ×2 IMPLANT
ELECT BLADE 4.0 EZ CLEAN MEGAD (MISCELLANEOUS) ×3
ELECT BLADE 6.5 EXT (BLADE) IMPLANT
ELECT REM PT RETURN 9FT ADLT (ELECTROSURGICAL) ×3
ELECTRODE BLDE 4.0 EZ CLN MEGD (MISCELLANEOUS) ×1 IMPLANT
ELECTRODE REM PT RTRN 9FT ADLT (ELECTROSURGICAL) ×2 IMPLANT
EVACUATOR SILICONE 100CC (DRAIN) ×2 IMPLANT
GEL ULTRASOUND 20GR AQUASONIC (MISCELLANEOUS) ×2 IMPLANT
GLOVE BIOGEL PI IND STRL 6 (GLOVE) ×1 IMPLANT
GLOVE BIOGEL PI IND STRL 7.0 (GLOVE) ×2 IMPLANT
GLOVE BIOGEL PI INDICATOR 6 (GLOVE) ×1
GLOVE BIOGEL PI INDICATOR 7.0 (GLOVE) ×2
GLOVE SS BIOGEL STRL SZ 7 (GLOVE) ×2 IMPLANT
GLOVE SUPERSENSE BIOGEL SZ 7 (GLOVE) ×1
GOWN PREVENTION PLUS XLARGE (GOWN DISPOSABLE) ×2 IMPLANT
GOWN STRL NON-REIN LRG LVL3 (GOWN DISPOSABLE) ×12 IMPLANT
INSERT FOGARTY 61MM (MISCELLANEOUS) ×3 IMPLANT
INSERT FOGARTY SM (MISCELLANEOUS) ×6 IMPLANT
KIT BASIN OR (CUSTOM PROCEDURE TRAY) ×3 IMPLANT
KIT ROOM TURNOVER OR (KITS) ×3 IMPLANT
LOOP VESSEL MAXI BLUE (MISCELLANEOUS) IMPLANT
LOOP VESSEL MINI RED (MISCELLANEOUS) IMPLANT
NDL HYPO 25GX1X1/2 BEV (NEEDLE) IMPLANT
NEEDLE HYPO 25GX1X1/2 BEV (NEEDLE) ×3 IMPLANT
NS IRRIG 1000ML POUR BTL (IV SOLUTION) ×6 IMPLANT
PACK AORTA (CUSTOM PROCEDURE TRAY) ×3 IMPLANT
PACK UNIVERSAL I (CUSTOM PROCEDURE TRAY) ×2 IMPLANT
PAD ARMBOARD 7.5X6 YLW CONV (MISCELLANEOUS) ×6 IMPLANT
PATCH VASCULAR VASCU GUARD 1X6 (Vascular Products) ×2 IMPLANT
SET COLLECT BLD 21X3/4 12 PB (MISCELLANEOUS) ×2 IMPLANT
SPECIMEN JAR MEDIUM (MISCELLANEOUS) ×3 IMPLANT
SPONGE LAP 18X18 X RAY DECT (DISPOSABLE) IMPLANT
SPONGE LAP 4X18 X RAY DECT (DISPOSABLE) ×3 IMPLANT
STAPLER VISISTAT 35W (STAPLE) ×6 IMPLANT
STOPCOCK 4 WAY LG BORE MALE ST (IV SETS) ×2 IMPLANT
SUT ETHIBOND 5 LR DA (SUTURE) IMPLANT
SUT ETHILON 3 0 PS 1 (SUTURE) ×2 IMPLANT
SUT PROLENE 1 XLH 60 (SUTURE) ×2 IMPLANT
SUT PROLENE 3 0 SH1 36 (SUTURE) ×1 IMPLANT
SUT PROLENE 4 0 RB 1 (SUTURE)
SUT PROLENE 4-0 RB1 .5 CRCL 36 (SUTURE) IMPLANT
SUT PROLENE 5 0 CC 1 (SUTURE) IMPLANT
SUT PROLENE 6 0 BV (SUTURE) ×2 IMPLANT
SUT PROLENE 6 0 C 1 30 (SUTURE) ×3 IMPLANT
SUT SILK 2 0 SH CR/8 (SUTURE) ×1 IMPLANT
SUT SILK 2 0 TIES 17X18 (SUTURE) ×3
SUT SILK 2-0 18XBRD TIE BLK (SUTURE) ×2 IMPLANT
SUT SILK 3 0 TIES 17X18 (SUTURE) ×3
SUT SILK 3-0 18XBRD TIE BLK (SUTURE) ×2 IMPLANT
SUT VIC AB 2-0 CTX 36 (SUTURE) IMPLANT
SUT VIC AB 3-0 MH 27 (SUTURE) ×2 IMPLANT
SUT VIC AB 3-0 SH 27 (SUTURE)
SUT VIC AB 3-0 SH 27X BRD (SUTURE) IMPLANT
SYR 30ML LL (SYRINGE) ×2 IMPLANT
SYR 3ML LL SCALE MARK (SYRINGE) ×4 IMPLANT
SYR CONTROL 10ML LL (SYRINGE) ×2 IMPLANT
SYR TB 1ML LUER SLIP (SYRINGE) ×4 IMPLANT
SYRINGE 10CC LL (SYRINGE) ×2 IMPLANT
TOWEL OR 17X24 6PK STRL BLUE (TOWEL DISPOSABLE) ×6 IMPLANT
TOWEL OR 17X26 10 PK STRL BLUE (TOWEL DISPOSABLE) ×6 IMPLANT
TRAY FOLEY BAG SILVER LF 14FR (CATHETERS) ×2 IMPLANT
TRAY FOLEY CATH 14FRSI W/METER (CATHETERS) ×3 IMPLANT
TUBING EXTENTION W/L.L. (IV SETS) ×2 IMPLANT
WATER STERILE IRR 1000ML POUR (IV SOLUTION) ×6 IMPLANT

## 2011-01-29 NOTE — Anesthesia Postprocedure Evaluation (Signed)
  Anesthesia Post-op Note  Patient: [name]  Procedure(s) Performed:  EMBOLECTOMY - Left Popliteal and Tibial Embolectomy with patch angioplasty  Patient Location: PACU  Anesthesia Type: General  Level of Consciousness: awake, alert , sedated and patient cooperative  Airway and Oxygen Therapy: Patient Spontanous Breathing and Patient connected to nasal cannula oxygen  Post-op Pain: mild  Post-op Assessment: Post-op Vital signs reviewed, Patient's Cardiovascular Status Stable, Respiratory Function Stable, Patent Airway, No signs of Nausea or vomiting and Pain level controlled  Post-op Vital Signs: stable  Complications: No apparent anesthesia complications

## 2011-01-29 NOTE — OR Nursing (Signed)
Dr. Kellie Simmering aware of questionable latex allergy, Dr. Kellie Simmering ordered latex embolectomy catheter to use for the patient, the patient denied latex allergy

## 2011-01-29 NOTE — Progress Notes (Signed)
Patient ID: [name], female   DOB: 11-16-1970, 41 y.o.   MRN: [id number] Vascular Surgery Progress Note  Subjective: Ischemic left leg  Objective:  Filed Vitals:   01/29/11 0600  BP: 108/69  Pulse: 115  Temp:   Resp: 18   Patient continues to have a mottled left foot with an audible flow in dorsalis pedis and posterior tibial artery. She had thrombolyzes performed over past 48 hours as well as heparin infusion and angiography today reveals no thrombus remaining in the infrarenal aorta or iliac systems bilaterally. She does have some thrombus in left superficial femoral artery proximally and in all 3 tibial vessels on the left . Patient had 2-D echocardiogram with normal ejection fraction 65-70% and no thrombus noted in the left heart.  Labs:  Lab 01/28/11 0430 01/27/11 1033  CREATININE 1.00 0.74    Lab 01/28/11 0430 01/27/11 1033  NA 132* 131*  K 3.7 3.3*  CL 101 98  CO2 25 27  BUN 12 11  CREATININE 1.00 0.74  LABGLOM -- --  GLUCOSE 99 --  CALCIUM 7.9* 8.8    Lab 01/29/11 0337 01/28/11 0430 01/27/11 1033  WBC 12.3* 10.6* 13.0*  HGB 9.7* 11.6* 12.6  HCT 29.0* 34.8* 37.2  PLT 206 273 324    Lab 01/29/11 0337 01/27/11 1033  INR 1.01 0.91    I/O last 3 completed shifts: In: [id number] [P.O.:460; I.V.:1764; IV Piggyback:1350] Out: 1150 [Urine:1150]  Imaging: Dg Chest Portable 1 View  01/27/2011  *RADIOLOGY REPORT*  Clinical Data: Shortness of breath.  PORTABLE CHEST - 1 VIEW  Comparison: Chest 01/27/2011.  Findings: Lungs are clear.  Heart size is normal.  No pneumothorax or pleural effusion.  IMPRESSION: No acute disease.  Original Report Authenticated By: Arvid Right. D'ALESSIO, M.D.   Dg Chest Portable 1 View  01/27/2011  *RADIOLOGY REPORT*  Clinical Data: Left lower extremity pain, no pulses, hypertension  PORTABLE CHEST - 1 VIEW  Comparison: Portable exam 1029 hours compared to 03/21/2010  Findings: Normal heart size, mediastinal contours, and pulmonary vascularity.  Mild peribronchial thickening. No pulmonary infiltrate, pleural effusion, or pneumothorax. Bones unremarkable.  IMPRESSION: Minimal chronic bronchitic changes.  Original Report Authenticated By: Burnetta Sabin, M.D.    Assessment/Plan:   LOS: 2 days  s/p Procedure(s): LOWER EXTREMITY ANGIOGRAM Plan-will take patient to the operating room for exploration of left leg including tibial vessels and perform thrombectomy left superficial femoral artery were small amount of thrombus is located. I discussed this with patient and the fact that she is at high risk of requiring left leg amputation because of distal thrombus which is not responded to thrombolyzes for 48 hours. She understands this will also discuss this with her family. All questions have been answered and we'll proceed with surgery. She has been reexamined.  Tinnie Gens, MD 01/29/2011 8:17 AM

## 2011-01-29 NOTE — Preoperative (Addendum)
Beta Blockers   Reason not to administer Beta Blockers:Not Applicable 

## 2011-01-29 NOTE — Addendum Note (Signed)
Addendum  created 01/29/11 1549 by Nikolai

## 2011-01-29 NOTE — Transfer of Care (Signed)
Immediate Anesthesia Transfer of Care Note  Patient: [name]  Procedure(s) Performed:  EMBOLECTOMY - Left Popliteal and Tibial Embolectomy with patch angioplasty  Patient Location: SICU  Anesthesia Type: General  Level of Consciousness: awake and alert   Airway & Oxygen Therapy: Patient Spontanous Breathing and Patient connected to nasal cannula oxygen  Post-op Assessment: Report given to PACU RN, Post -op Vital signs reviewed and stable and Patient moving all extremities X 4  Post vital signs: Reviewed and stable Filed Vitals:   01/29/11 0742  BP:   Pulse: 108  Temp:   Resp:     Complications: No apparent anesthesia complications

## 2011-01-29 NOTE — OR Nursing (Signed)
Late Entry:  Patient disposition to SICU instead of PACU. Updated the dose of medication tenectoplase. Updated the post-op site completion.  Ramonita Lab, RN, 01/28/10 1345

## 2011-01-29 NOTE — OR Nursing (Signed)
Sheath placed in patient's right groin in PV lab removed per Dr. Kellie Simmering at [phone] in [place] [facility]. Pressure held by Dr. Kellie Simmering for 15 minutes, then pressure held by Jolly Mango, RN for 15 minutes for a total of 30 minutes.

## 2011-01-29 NOTE — ED Provider Notes (Signed)
Medical screening examination/treatment/procedure(s) were performed by non-physician practitioner and as supervising physician I was immediately available for consultation/collaboration.  [name]. Olin Hauser, MD 01/29/11 [phone]

## 2011-01-29 NOTE — Interval H&P Note (Signed)
History and Physical Interval Note:  01/29/2011 8:49 AM  [name]  has presented today for surgery, with the diagnosis of Ischemic Left LE  The various methods of treatment have been discussed with the patient and family. After consideration of risks, benefits and other options for treatment, the patient has consented to  Procedure(s): AORTA BIFEMORAL BYPASS GRAFT as a surgical intervention .  The patients' history has been reviewed, patient examined, no change in status, stable for surgery.  I have reviewed the patients' chart and labs.  Questions were answered to the patient's satisfaction.     Flem Enderle D

## 2011-01-29 NOTE — Anesthesia Preprocedure Evaluation (Addendum)
Anesthesia Evaluation    Reviewed: Allergy & Precautions, H&P , NPO status , Patient's Chart, lab work & pertinent test results  History of Anesthesia Complications (+) AWARENESS UNDER ANESTHESIA  Airway       Dental   Pulmonary Current Smoker,          Cardiovascular     Neuro/Psych    GI/Hepatic (+)     substance abuse  marijuana use,   Endo/Other  Diabetes mellitus-, Type 2, Insulin Dependent  Renal/GU Renal InsufficiencyRenal disease     Musculoskeletal   Abdominal   Peds  Hematology   Anesthesia Other Findings   Reproductive/Obstetrics                         Anesthesia Physical Anesthesia Plan  ASA: III  Anesthesia Plan: General   Post-op Pain Management:    Induction: Intravenous  Airway Management Planned: Oral ETT  Additional Equipment: Arterial line  Intra-op Plan:   Post-operative Plan: Possible Post-op intubation/ventilation and Extubation in OR  Informed Consent: I have reviewed the patients History and Physical, chart, labs and discussed the procedure including the risks, benefits and alternatives for the proposed anesthesia with the patient or authorized representative who has indicated his/her understanding and acceptance.   Dental advisory given  Plan Discussed with: Anesthesiologist, Surgeon and CRNA  Anesthesia Plan Comments:        Anesthesia Quick Evaluation

## 2011-01-29 NOTE — Op Note (Signed)
OPERATIVE REPORT  Date of Surgery: 01/27/2011 - 01/29/2011  Surgeon: Tinnie Gens, MD  Assistant: Gayla Doss  Pre-op Diagnosis: Ischemic Left Lower Extremity secondary to peripheral emboli-etiology unknown Post-op Diagnosis  same  Procedure: Procedure(s): #1 exploration of left popliteal artery with thrombectomy left superficial femoral artery, anterior tibial artery, peroneal artery, and posterior tibial artery with patch angioplasty left popliteal artery using bovine pericardial patch #2 infusion TNK into tibial vessels #3 completion angiogram left leg Anesthesia: Gen. endotracheal  EBL: Minimal  Complications: None  Procedure Details: Patient was taken to the operating room placed in the supine position at which time the left lower extremity was prepped with Betadine scrub and solution and draped in routine sterile manner. The patient had a sheath in the right femoral artery which was used for blood pressure monitoring. Satisfactory general endotracheal anesthesia was administered. An incision was made medially below the knee and the left leg carried down to subcutaneous tissue in the popliteal fossa was entered below the knee. The popliteal artery was dissected free and did have a weakly palpable pulse. Anterior tibial artery was completely dissected free out about 2 cm in the tibial peroneal trunk was dissected free down to the origin of both the peroneal and posterior tibial arteries first which were dissected free for about 2 cm each. The anterior tibial vein was ligated with 0 silk ties and divided to assist in exposure. Patient was then heparinized. A longitudinal opening made in the popliteal artery 15 blade extended with Potts scissors. A 4 Fogarty catheter was then passed proximally up to the aortic level and upon return to core of embolic-looking material was removed which was felt to have been located in the superficial femoral artery based on the preoperative angiogram. This  was followed by excellent inflow which was much better than prior to the embolectomy. Additional passes yielded no further debris. Along this a 3 and 2 Fogarty catheter were each passed sequentially down all the tibial vessels beginning with the anterior tibial in the paramedial in the posterior tibial. The anterior tibial artery had a lot of debris in the distal vessel which was removed and eventually the 2 Fogarty wouldn't traverse the ankle joint and out onto the dorsum of the foot. It also went down the entire length of the peroneal artery and posterior tibial artery appeared to be more disease at the ankle level. Heparin saline to be flushed in all vessels. Following this 2 mg of TNK which had been diluted and approximately 10 cc of saline was infused into the vessels. This was left in place while the patch was sewn in place. Bovine pericardial patch was utilized after rinsing it appropriately was fashioned and sewn into place to service a patch angioplasty of this longitudinal rod and in the popliteal artery. Following completion of this an appropriate flushing Vesseloops released there was excellent pulse and Doppler flow in all vessels with excellent flow and the dorsum of the foot and distal peroneal artery in multiple flow which was not as brisk in the posterior tibial artery. No protamine was given. Adequate hemostasis was achieved. Intraoperative arteriogram revealed a widely patent popliteal artery with good filling of the anterior tibial artery to the dorsum of the foot, the peroneal artery to the distal leg, and the posterior tibial artery to around the ankle level where it was diseased. Following this wound was closed in layers of Vicryl subcuticular fashion with Dermabond sterile dressing applied patient taken to recovery in stable condition Kellie Simmering concluding  dictation on Loralie Champagne, MD 01/29/2011 12:18 PM

## 2011-01-29 NOTE — H&P (View-Only) (Signed)
Patient ID: [name], female   DOB: [birth year], 41 y.o.   MRN: [id number] Vascular Surgery Progress Note  Subjective: Ischemic left leg  Objective:  Filed Vitals:   01/29/11 0600  BP: 108/69  Pulse: 115  Temp:   Resp: 18   Patient continues to have a mottled left foot with an audible flow in dorsalis pedis and posterior tibial artery. She had thrombolyzes performed over past 48 hours as well as heparin infusion and angiography today reveals no thrombus remaining in the infrarenal aorta or iliac systems bilaterally. She does have some thrombus in left superficial femoral artery proximally and in all 3 tibial vessels on the left . Patient had 2-D echocardiogram with normal ejection fraction 65-70% and no thrombus noted in the left heart.  Labs:  Lab 01/28/11 0430 01/27/11 1033  CREATININE 1.00 0.74    Lab 01/28/11 0430 01/27/11 1033  NA 132* 131*  K 3.7 3.3*  CL 101 98  CO2 25 27  BUN 12 11  CREATININE 1.00 0.74  LABGLOM -- --  GLUCOSE 99 --  CALCIUM 7.9* 8.8    Lab 01/29/11 0337 01/28/11 0430 01/27/11 1033  WBC 12.3* 10.6* 13.0*  HGB 9.7* 11.6* 12.6  HCT 29.0* 34.8* 37.2  PLT 206 273 324    Lab 01/29/11 0337 01/27/11 1033  INR 1.01 0.91    I/O last 3 completed shifts: In: [id number] [P.O.:460; I.V.:1764; IV Piggyback:1350] Out: 1150 [Urine:1150]  Imaging: Dg Chest Portable 1 View  01/27/2011  *RADIOLOGY REPORT*  Clinical Data: Shortness of breath.  PORTABLE CHEST - 1 VIEW  Comparison: Chest 01/27/2011.  Findings: Lungs are clear.  Heart size is normal.  No pneumothorax or pleural effusion.  IMPRESSION: No acute disease.  Original Report Authenticated By: Arvid Right. D'ALESSIO, M.D.   Dg Chest Portable 1 View  01/27/2011  *RADIOLOGY REPORT*  Clinical Data: Left lower extremity pain, no pulses, hypertension  PORTABLE CHEST - 1 VIEW  Comparison: Portable exam 1029 hours compared to 03/21/2010  Findings: Normal heart size, mediastinal contours, and pulmonary vascularity.  Mild peribronchial thickening. No pulmonary infiltrate, pleural effusion, or pneumothorax. Bones unremarkable.  IMPRESSION: Minimal chronic bronchitic changes.  Original Report Authenticated By: Burnetta Sabin, M.D.    Assessment/Plan:   LOS: 2 days  s/p Procedure(s): LOWER EXTREMITY ANGIOGRAM Plan-will take patient to the operating room for exploration of left leg including tibial vessels and perform thrombectomy left superficial femoral artery were small amount of thrombus is located. I discussed this with patient and the fact that she is at high risk of requiring left leg amputation because of distal thrombus which is not responded to thrombolyzes for 48 hours. She understands this will also discuss this with her family. All questions have been answered and we'll proceed with surgery. She has been reexamined.  Tinnie Gens, MD 01/29/2011 8:17 AM

## 2011-01-29 NOTE — Interval H&P Note (Signed)
History and Physical Interval Note:  01/29/2011 7:30 AM  [name]  has presented today for surgery, with the diagnosis of Left leg thrombus  The various methods of treatment have been discussed with the patient and family. After consideration of risks, benefits and other options for treatment, the patient has consented to  Procedure(s): LOWER EXTREMITY ANGIOGRAM as a surgical intervention .  The patients' history has been reviewed, patient examined, no change in status, stable for surgery.  I have reviewed the patients' chart and labs.  Questions were answered to the patient's satisfaction.     Dray Dente D

## 2011-01-29 NOTE — Anesthesia Procedure Notes (Signed)
Procedure Name: Intubation Date/Time: 01/29/2011 9:42 AM Performed by: Carl Best K Pre-anesthesia Checklist: Patient identified, Timeout performed, Emergency Drugs available, Suction available and Patient being monitored Patient Re-evaluated:Patient Re-evaluated prior to inductionOxygen Delivery Method: Circle System Utilized Preoxygenation: Pre-oxygenation with 100% oxygen Intubation Type: IV induction Ventilation: Mask ventilation without difficulty Laryngoscope Size: Miller and 2 Grade View: Grade II Tube type: Oral Tube size: 7.5 mm Number of attempts: 1 Airway Equipment and Method: stylet Placement Confirmation: ETT inserted through vocal cords under direct vision,  positive ETCO2 and breath sounds checked- equal and bilateral Secured at: 21 cm Tube secured with: Tape Dental Injury: Teeth and Oropharynx as per pre-operative assessment

## 2011-01-29 NOTE — H&P (View-Only) (Signed)
[facility]  Progress Note   Date of Surgery: 01/27/2011  Procedure(s): LOWER EXTREMITY ANGIOGRAM Surgeon: Surgeon(s): Rosetta Posner, MD  1 Day Post-Op  History of Present Illness  [name] is a 41 y.o. female who is S/P: LOWER EXTREMITY ANGIOGRAM with runoff.  The patient's pre-op symptoms of LLE pain  and coolness are Unchanged . Patients pain is not well controlled on 2mg  Dilaudid IV> Pt having some itching with pain meds.   Imaging: Dg Chest Portable 1 View  01/27/2011  *RADIOLOGY REPORT*  Clinical Data: Shortness of breath.  PORTABLE CHEST - 1 VIEW  Comparison: Chest 01/27/2011.  Findings: Lungs are clear.  Heart size is normal.  No pneumothorax or pleural effusion.  IMPRESSION: No acute disease.  Original Report Authenticated By: Arvid Right. D'ALESSIO, M.D.   Dg Chest Portable 1 View  01/27/2011  *RADIOLOGY REPORT*  Clinical Data: Left lower extremity pain, no pulses, hypertension  PORTABLE CHEST - 1 VIEW  Comparison: Portable exam 1029 hours compared to 03/21/2010  Findings: Normal heart size, mediastinal contours, and pulmonary vascularity. Mild peribronchial thickening. No pulmonary infiltrate, pleural effusion, or pneumothorax. Bones unremarkable.  IMPRESSION: Minimal chronic bronchitic changes.  Original Report Authenticated By: Burnetta Sabin, M.D.    Significant Diagnostic Studies:  Fibrinogen 635  CBC    Component Value Date/Time   WBC 10.6* 01/28/2011 0430   RBC 3.87 01/28/2011 0430   HGB 11.6* 01/28/2011 0430   HCT 34.8* 01/28/2011 0430   PLT 273 01/28/2011 0430   MCV 89.9 01/28/2011 0430   MCH 30.0 01/28/2011 0430   MCHC 33.3 01/28/2011 0430   RDW 15.6* 01/28/2011 0430   LYMPHSABS 3.9 01/27/2011 1033   MONOABS 0.9 01/27/2011 1033   EOSABS 0.1 01/27/2011 1033   BASOSABS 0.0 01/27/2011 1033    BMET    Component Value Date/Time   NA 132* 01/28/2011 0430   K 3.7 01/28/2011 0430   CL 101 01/28/2011 0430   CO2 25 01/28/2011 0430   GLUCOSE 99  01/28/2011 0430   BUN 12 01/28/2011 0430   CREATININE 1.00 01/28/2011 0430   CALCIUM 7.9* 01/28/2011 0430   GFRNONAA 69* 01/28/2011 0430   GFRAA 80* 01/28/2011 0430    COAG Lab Results  Component Value Date   INR 0.91 01/27/2011   INR 0.88 10/16/2010   INR 1.0 11/14/2007   No results found for this basename: PTT     Physical Examination  BP Readings from Last 3 Encounters:  01/28/11 87/54  01/28/11 87/54  01/28/11 87/54   Temp Readings from Last 3 Encounters:  01/28/11 99.3 F (37.4 C) Oral  01/28/11 99.3 F (37.4 C) Oral  01/28/11 99.3 F (37.4 C) Oral   SpO2 Readings from Last 3 Encounters:  01/28/11 93%  01/28/11 93%  01/28/11 93%   Pulse Readings from Last 3 Encounters:  01/28/11 101  01/28/11 101  01/28/11 101    Pt is A&O x 3 left lower extremity:Limb is cold and pale. Minimal movement of toes Painful to touch  Left Dorsalis Pedis pulse is absent LeftPosterior tibial pulse is  monophasic by Doppler  Assessment:  Pt still with significant symptoms in LLE VSS H/H Stable  Plan: Repeat angio today OR tomorrow Continue TNK and heparin as ordered  [name] [id number] 01/28/2011 7:46 AM        Patient has been on TNK. overnight for attempted thrombolysis of tibial vessels left leg. On physical exam she continues to have significant  pain lateral aspect left foot. The foot is pale and cool. She does have good Doppler flow in the left posterior tibial artery. She has some decreased motion in the distal left foot. There is no left calf tenderness. She has 3+ femoral pulses bilaterally. She continues on heparin 1000 units per hour +50 cc per hour at TNK. Plan is to continue attempts at thrombolyzes today and repeat angiogram left leg later this afternoon. Will then resume thrombolysis overnight and discontinue at 4 AM. Plan exploration of abdominal aorta tomorrow to remove thrombus and perform bypass if indicated. Have discussed this at length with  patient and the fact that surgical procedure will not help the pain in the left leg. Hopefully at thrombolysis will improve the situation. She still is at jeopardy of left leg limb loss.

## 2011-01-29 NOTE — Op Note (Signed)
Preop diagnosis-ischemic left leg secondary to thrombus in superficial femoral and tibial vessels Postop diagnosis same Procedure abdominal aortogram with left lower extremity runoff via infusion catheter right common femoral artery Surgeon Kellie Simmering Contrast [phone] cc Complications none Description of procedure patient was taken to the peripheral endovascular lab placed in supine position area there was an infusion sheath and catheter entering the right common femoral artery which was infusing TNK and heparin via the sheath and infusion catheter which was in the left popliteal artery. Infusion catheter was removed and the sheath was withdrawn to the left distal external iliac artery under fluoroscopic guidance. An angiogram was then performed through the crossover sheath which revealed some residual thrombus in the proximal left superficial femoral artery but otherwise the vessel is widely patent down through the popliteal artery. There is thrombus in all 3 tibial vessels distally. No vessel filled past the ankle the posterior tibial was the best vessel. He did seem to feel better than the study performed 18 hours previously. Following this the sheath was withdrawn over a guidewire into the right common iliac artery and then the guidewire was advanced into the suprarenal aorta pigtail catheter passed over the guidewire guidewire removed an abdominal aortogram was performed. This revealed both renal arteries be widely patent. The thrombus which been located in the infrarenal abdominal aorta for the past 48 hours is no longer present. There is no definitive thrombus in the noted in either the aorta or both common or external iliac systems. There was once very small filling defect on the medial wall of the proximal common iliac artery but was unclear whether this was overlying shadows or actually a very small piece of thrombus. Oblique views of this were obtained as well as magnification views. Following this the  pigtail catheter was removed over the guidewire the long sheath removed the over-the-guidewire short 6 French sheath was inserted in the right common femoral artery and left in place after suturing it and securing it. Patient will be taken directly to the operating room for expiration of the left leg and attempted thrombectomy of tibial vessels  Findings #1 no residual thrombus in the aorto-iliac system #2 thrombus in left superficial femoral artery and all 3 tibial vessels on the left.  Patient tolerated surgery well was taken directly to the operating room for surgical exploration and attempted thrombectomy left leg

## 2011-01-30 ENCOUNTER — Inpatient Hospital Stay (HOSPITAL_COMMUNITY): Payer: Medicaid Other

## 2011-01-30 LAB — CBC
HCT: 27.9 % — ABNORMAL LOW (ref 36.0–46.0)
Hemoglobin: 9.8 g/dL — ABNORMAL LOW (ref 12.0–15.0)
MCH: 30.2 pg (ref 26.0–34.0)
MCHC: 35.1 g/dL (ref 30.0–36.0)
MCV: 85.8 fL (ref 78.0–100.0)
Platelets: 162 10*3/uL (ref 150–400)
RBC: 3.25 MIL/uL — ABNORMAL LOW (ref 3.87–5.11)
RDW: 16 % — ABNORMAL HIGH (ref 11.5–15.5)
WBC: 11.8 10*3/uL — ABNORMAL HIGH (ref 4.0–10.5)

## 2011-01-30 LAB — HEPARIN LEVEL (UNFRACTIONATED)
Heparin Unfractionated: <0.1 IU/mL — ABNORMAL LOW (ref 0.30–0.70)
Heparin Unfractionated: 0.1 IU/mL — ABNORMAL LOW (ref 0.30–0.70)
Heparin Unfractionated: <0.1 IU/mL — ABNORMAL LOW (ref 0.30–0.70)

## 2011-01-30 LAB — GLUCOSE, CAPILLARY
Glucose-Capillary: 119 mg/dL — ABNORMAL HIGH (ref 70–99)
Glucose-Capillary: 134 mg/dL — ABNORMAL HIGH (ref 70–99)
Glucose-Capillary: 153 mg/dL — ABNORMAL HIGH (ref 70–99)
Glucose-Capillary: 174 mg/dL — ABNORMAL HIGH (ref 70–99)
Glucose-Capillary: 175 mg/dL — ABNORMAL HIGH (ref 70–99)
Glucose-Capillary: 176 mg/dL — ABNORMAL HIGH (ref 70–99)
Glucose-Capillary: 177 mg/dL — ABNORMAL HIGH (ref 70–99)

## 2011-01-30 LAB — BASIC METABOLIC PANEL
BUN: 14 mg/dL (ref 6–23)
CO2: 21 mEq/L (ref 19–32)
Calcium: 7.2 mg/dL — ABNORMAL LOW (ref 8.4–10.5)
Chloride: 104 mEq/L (ref 96–112)
Creatinine, Ser: 1 mg/dL (ref 0.50–1.10)
GFR calc Af Amer: 80 mL/min — ABNORMAL LOW (ref >90)
GFR calc non Af Amer: 69 mL/min — ABNORMAL LOW (ref >90)
Glucose, Bld: 177 mg/dL — ABNORMAL HIGH (ref 70–99)
Potassium: 3.4 mEq/L — ABNORMAL LOW (ref 3.5–5.1)
Sodium: 134 mEq/L — ABNORMAL LOW (ref 135–145)

## 2011-01-30 LAB — PROTIME-INR
INR: 1.07 (ref 0.00–1.49)
Prothrombin Time: 14.1 seconds (ref 11.6–15.2)

## 2011-01-30 MED ORDER — FENTANYL 10 MCG/ML IV SOLN
INTRAVENOUS | Status: DC
  Administered 2011-01-30: 15 ug via INTRAVENOUS
  Administered 2011-01-31: 150 ug via INTRAVENOUS
  Administered 2011-01-31: 90 ug via INTRAVENOUS
  Administered 2011-01-31: 60 ug via INTRAVENOUS
  Administered 2011-01-31: 16:00:00 via INTRAVENOUS
  Administered 2011-01-31: 105 ug via INTRAVENOUS
  Administered 2011-01-31: 22.5 ug via INTRAVENOUS
  Administered 2011-02-01: 195 ug via INTRAVENOUS
  Administered 2011-02-01: 22.5 ug via INTRAVENOUS
  Filled 2011-01-30 (×3): qty 50

## 2011-01-30 MED ORDER — POTASSIUM CHLORIDE CRYS ER 20 MEQ PO TBCR
40.0000 meq | EXTENDED_RELEASE_TABLET | Freq: Once | ORAL | Status: AC
  Administered 2011-01-30: 40 meq via ORAL
  Filled 2011-01-30: qty 2

## 2011-01-30 MED ORDER — ONDANSETRON HCL 4 MG/2ML IJ SOLN: 4.0000 mg | Freq: Four times a day (QID) | INTRAMUSCULAR | Status: DC | PRN

## 2011-01-30 MED ORDER — NALOXONE HCL 0.4 MG/ML IJ SOLN: 0.4000 mg | INTRAMUSCULAR | Status: DC | PRN

## 2011-01-30 MED ORDER — SODIUM CHLORIDE 0.9 % IJ SOLN
10.0000 mL | INTRAMUSCULAR | Status: DC | PRN
  Administered 2011-01-30 – 2011-02-04 (×9): 10 mL

## 2011-01-30 MED ORDER — WARFARIN SODIUM 7.5 MG PO TABS
7.5000 mg | ORAL_TABLET | Freq: Once | ORAL | Status: AC
  Administered 2011-01-30: 7.5 mg via ORAL
  Filled 2011-01-30: qty 1

## 2011-01-30 MED ORDER — SODIUM CHLORIDE 0.9 % IJ SOLN
9.0000 mL | INTRAMUSCULAR | Status: DC | PRN
  Administered 2011-01-31: 05:00:00 via INTRAVENOUS

## 2011-01-30 MED ORDER — SODIUM CHLORIDE 0.9 % IJ SOLN
10.0000 mL | Freq: Two times a day (BID) | INTRAMUSCULAR | Status: DC
  Administered 2011-01-30 – 2011-02-01 (×5): 10 mL
  Filled 2011-01-30: qty 10

## 2011-01-30 MED ORDER — HYDROMORPHONE 0.3 MG/ML IV SOLN
INTRAVENOUS | Status: DC
  Administered 2011-01-30: 2.1 mg via INTRAVENOUS
  Administered 2011-01-30: 14:00:00 via INTRAVENOUS
  Administered 2011-01-30: 2.7 mg via INTRAVENOUS
  Administered 2011-01-30: 1.8 mg via INTRAVENOUS
  Filled 2011-01-30: qty 25

## 2011-01-30 NOTE — Progress Notes (Signed)
PT Cancellation Note  Treatment cancelled at this time due to patient receiving procedure or test. Will re-attempt this date as census allows.  [name] 01/30/2011, 10:41 AM Pager [phone]

## 2011-01-30 NOTE — Progress Notes (Signed)
Pt follow up after fall on 2300, stated did not hit head, no abnormalities noted during palpation of head

## 2011-01-30 NOTE — Progress Notes (Signed)
ANTICOAGULATION CONSULT NOTE - Follow Up Consult  Pharmacy Consult for Heparin Indication: s/p L leg embolectomy  Allergies  Allergen Reactions  . Aspirin     Cannot take this medication due to kidney disease  . Ibuprofen     Kidney    Patient Measurements: Height: 5\' 1"  (154.9 cm) Weight: 125 lb (56.7 kg) IBW/kg (Calculated) : 47.8   Vital Signs: Temp: 98.8 F (37.1 C) (01/12 0759) Temp src: Oral (01/12 0759) BP: 99/45 mmHg (01/12 0800) Pulse Rate: 110  (01/12 0900)  Labs:  [name] 01/30/11 0810 01/29/11 2324 01/29/11 1834 01/29/11 1724 01/29/11 0337 01/28/11 0430 01/27/11 1033  HGB 9.8* -- 11.6* -- -- -- --  HCT 27.9* -- 33.1* -- 29.0* -- --  PLT 162 -- 178 -- 206 -- --  APTT -- -- -- -- 32 -- 25  LABPROT 14.1 -- -- -- 13.5 -- 12.5  INR 1.07 -- -- -- 1.01 -- 0.91  HEPARINUNFRC 0.10* <0.10* -- -- -- <0.10* --  CREATININE 1.00 -- -- 0.83 -- 1.00 --  CKTOTAL -- -- -- -- -- -- --  CKMB -- -- -- -- -- -- --  TROPONINI -- -- -- -- -- -- --   Estimated Creatinine Clearance: 55.9 ml/min (by C-G formula based on Cr of 1).   Medications:  Prescriptions prior to admission  Medication Sig Dispense Refill  . acetaminophen (TYLENOL) 500 MG tablet Take 1,000 mg by mouth every 6 (six) hours as needed. For pain       . ALPRAZolam (XANAX) 1 MG tablet Take 1 mg by mouth daily as needed. Anxiety      . diphenhydrAMINE (BENADRYL) 25 MG tablet Take 25 mg by mouth at bedtime as needed. For sleep      . fish oil-omega-3 fatty acids 1000 MG capsule Take 1 g by mouth daily.       . furosemide (LASIX) 20 MG tablet Take 20 mg by mouth 3 (three) times a week. On Mondays, Wednesdays, and Fridays       . insulin aspart (NOVOLOG) 100 UNIT/ML injection Inject 10 Units into the skin 3 (three) times daily before meals.  1 vial  12  . insulin glargine (LANTUS) 100 UNIT/ML injection Inject 30 Units into the skin at bedtime.  10 mL  12  . losartan (COZAAR) 25 MG tablet Take 12.5 mg by mouth daily.         . Multiple Vitamins-Minerals (MULTIVITAMINS THER. W/MINERALS) TABS Take 1 tablet by mouth daily.        . potassium chloride SA (K-DUR,KLOR-CON) 20 MEQ tablet Take 0.5 tablets (10 mEq total) by mouth daily.  7 tablet  0  . sertraline (ZOLOFT) 50 MG tablet Take 50 mg by mouth daily.          Assessment: 41 yo female with L leg ischemia s/p embolectomy 1/11 for Heparin/Coumadin.  Heparin level 0.1 (subtherapeutic). INR 1.07 at baseline. Noted decrease in Hgb - will watch.  Goal of Therapy:  Heparin level 0.3-0.7 units/ml   Plan:  1. Increase heparin to 1400 units/hr.  2. F/u 6 hr heparin level 3. Coumadin 7.5 mg po today 4. F/u daily INR and for s/s of bleeding    Sindy Guadeloupe 01/30/2011,9:31 AM

## 2011-01-30 NOTE — Progress Notes (Signed)
Wasted 3 mg dilaudid from pca, not able to chart it in pixes. Cheryln Manly

## 2011-01-30 NOTE — Progress Notes (Signed)
ANTICOAGULATION CONSULT NOTE - Follow Up Consult  Pharmacy Consult for Heparin Indication: s/p L leg embolectomy  Patient Measurements: Height: 5\' 1"  (154.9 cm) Weight: 125 lb (56.7 kg) IBW/kg (Calculated) : 47.8   Labs:  Basename 01/30/11 1630 01/30/11 0810 01/29/11 2324 01/29/11 1834 01/29/11 1724 01/29/11 0337 01/28/11 0430  HGB -- 9.8* -- 11.6* -- -- --  HCT -- 27.9* -- 33.1* -- 29.0* --  PLT -- 162 -- 178 -- 206 --  APTT -- -- -- -- -- 32 --  LABPROT -- 14.1 -- -- -- 13.5 --  INR -- 1.07 -- -- -- 1.01 --  HEPARINUNFRC <0.10* 0.10* <0.10* -- -- -- --  CREATININE -- 1.00 -- -- 0.83 -- 1.00  CKTOTAL -- -- -- -- -- -- --  CKMB -- -- -- -- -- -- --  TROPONINI -- -- -- -- -- -- --   Estimated Creatinine Clearance: 55.9 ml/min (by C-G formula based on Cr of 1).  Assessment: 41 yo female with L leg ischemia s/p embolectomy 1/11 on  heparin.    Heparin level remains SUBbtherapeutic despite the last rate increase. Spoke with RN and she reports line has been leaking. She will switch heparin to infuse through her PICC.   No bleeding noted.     Goal of Therapy:  Heparin level 0.3-0.7 units/ml   Plan:  1. Continue heparin at 1450 units/hr (14.5 ml/hr) 2. Heparin level 6 hrs after line site change   Pati Gallo 01/30/2011,6:30 PM

## 2011-01-30 NOTE — Plan of Care (Signed)
Problem: Phase I Progression Outcomes Goal: Other Phase I Outcomes/Goals Outcome: Progressing Tolerate increase activity

## 2011-01-30 NOTE — Progress Notes (Signed)
ANTICOAGULATION CONSULT NOTE - Follow Up Consult  Pharmacy Consult for Heparin Indication: s/p L leg embolectomy  Allergies  Allergen Reactions  . Aspirin     Cannot take this medication due to kidney disease  . Ibuprofen     Kidney    Patient Measurements: Height: 5\' 1"  (154.9 cm) Weight: 125 lb (56.7 kg) IBW/kg (Calculated) : 47.8   Vital Signs: Temp: 99.1 F (37.3 C) (01/12 0037) Temp src: Oral (01/12 0037) BP: 107/51 mmHg (01/12 0000) Pulse Rate: 119  (01/12 0000)  Labs:  Basename 01/29/11 2324 01/29/11 1834 01/29/11 1724 01/29/11 0337 01/28/11 0430 01/27/11 1812 01/27/11 1033  HGB -- 11.6* -- 9.7* -- -- --  HCT -- 33.1* -- 29.0* 34.8* -- --  PLT -- 178 -- 206 273 -- --  APTT -- -- -- 32 -- -- 25  LABPROT -- -- -- 13.5 -- -- 12.5  INR -- -- -- 1.01 -- -- 0.91  HEPARINUNFRC <0.10* -- -- -- <0.10* 0.13* --  CREATININE -- -- 0.83 -- 1.00 -- 0.74  CKTOTAL -- -- -- -- -- -- --  CKMB -- -- -- -- -- -- --  TROPONINI -- -- -- -- -- -- --   Estimated Creatinine Clearance: 67.3 ml/min (by C-G formula based on Cr of 0.83).   Medications:  Prescriptions prior to admission  Medication Sig Dispense Refill  . acetaminophen (TYLENOL) 500 MG tablet Take 1,000 mg by mouth every 6 (six) hours as needed. For pain       . ALPRAZolam (XANAX) 1 MG tablet Take 1 mg by mouth daily as needed. Anxiety      . diphenhydrAMINE (BENADRYL) 25 MG tablet Take 25 mg by mouth at bedtime as needed. For sleep      . fish oil-omega-3 fatty acids 1000 MG capsule Take 1 g by mouth daily.       . furosemide (LASIX) 20 MG tablet Take 20 mg by mouth 3 (three) times a week. On Mondays, Wednesdays, and Fridays       . insulin aspart (NOVOLOG) 100 UNIT/ML injection Inject 10 Units into the skin 3 (three) times daily before meals.  1 vial  12  . insulin glargine (LANTUS) 100 UNIT/ML injection Inject 30 Units into the skin at bedtime.  10 mL  12  . losartan (COZAAR) 25 MG tablet Take 12.5 mg by mouth daily.         . Multiple Vitamins-Minerals (MULTIVITAMINS THER. W/MINERALS) TABS Take 1 tablet by mouth daily.        . potassium chloride SA (K-DUR,KLOR-CON) 20 MEQ tablet Take 0.5 tablets (10 mEq total) by mouth daily.  7 tablet  0  . sertraline (ZOLOFT) 50 MG tablet Take 50 mg by mouth daily.          Assessment: 41 yo female with L leg ischemia s/p embolectomy 1/11 for Heparin.  Heparin increase to 1000 units/hr starting at 4 pm 1/11  Goal of Therapy:  Heparin level 0.3-0.7 units/ml   Plan:  Increase Heparin 1250 units/hr Follow-up am labs.   Evaleen Sant, Bronson Curb 01/30/2011,1:13 AM

## 2011-01-30 NOTE — Progress Notes (Addendum)
[facility]  Progress Note Bypass Surgery  Date of Surgery: 01/27/2011 - 01/29/2011  Procedure(s): EMBOLECTOMY - Femoral, tibial LLE Surgeon: Surgeon(s): Mal Misty, MD  1 Day Post-Op  History of Present Illness  [name] is a 41 y.o. female who is S/P Procedure(s):- Femoral, tibial LLE EMBOLECTOMY. The patient's pre-op symptoms of pain numbness are Improved . Patients pain is well controlled.  RN states pt somewhat confused overnight on full dose PCA  VASC. LAB Studies: Pending     Imaging: Dg Ang/ext/uni/or Left  01/29/2011  *RADIOLOGY REPORT*  Clinical Data: Left popliteal embolectomy.  LEFT ANG/EXT/UNI/ OR  Comparison: None.  Findings: The distal popliteal artery is patent.  There is flow in the anterior tibial artery, posterior tibial artery and peroneal artery.  Limited evaluation of the distal runoff vessels.  There appears to be flow in the dorsalis pedis artery.  IMPRESSION: Patent distal popliteal artery and runoff vessels as described.  Original Report Authenticated By: Markus Daft, M.D.    Significant Diagnostic Studies: CBC    Component Value Date/Time   WBC 14.1* 01/29/2011 1834   RBC 3.79* 01/29/2011 1834   HGB 11.6* 01/29/2011 1834   HCT 33.1* 01/29/2011 1834   PLT 178 01/29/2011 1834   MCV 87.3 01/29/2011 1834   MCH 30.6 01/29/2011 1834   MCHC 35.0 01/29/2011 1834   RDW 15.7* 01/29/2011 1834   LYMPHSABS 3.9 01/27/2011 1033   MONOABS 0.9 01/27/2011 1033   EOSABS 0.1 01/27/2011 1033   BASOSABS 0.0 01/27/2011 1033    BMET    Component Value Date/Time   NA 130* 01/29/2011 1724   K 4.3 01/29/2011 1724   CL 99 01/29/2011 1724   CO2 9* 01/29/2011 1724   GLUCOSE 298* 01/29/2011 1724   BUN 16 01/29/2011 1724   CREATININE 0.83 01/29/2011 1724   CALCIUM 8.0* 01/29/2011 1724   GFRNONAA 86* 01/29/2011 1724   GFRAA >90 01/29/2011 1724    COAG Lab Results  Component Value Date   INR 1.01 01/29/2011   INR 0.91 01/27/2011   INR 0.88 10/16/2010   No  results found for this basename: PTT    Physical Examination  BP Readings from Last 3 Encounters:  01/30/11 105/51  01/30/11 105/51  01/30/11 105/51   Temp Readings from Last 3 Encounters:  01/30/11 99.2 F (37.3 C) Oral  01/30/11 99.2 F (37.3 C) Oral  01/30/11 99.2 F (37.3 C) Oral   SpO2 Readings from Last 3 Encounters:  01/30/11 92%  01/30/11 92%  01/30/11 92%   Pulse Readings from Last 3 Encounters:  01/30/11 103  01/30/11 103  01/30/11 103    Pt is A&O x 3 left lower extremity: Incision/s is/are clean,dry.intact, and  healing without hematoma, erythema or drainage Limb is warm; with good color positive foot drop on left  Left Dorsalis Pedis pulse is weak and monophasic by Doppler LeftPosterior tibial pulse ihas  Excellent biphasic signal LEFT PERNEAL MONOPHASIC SIGNAL  Assessment: Pt. Doing well Post-op pain is controlled - Somnolent Wounds are healing well Bypass is open and extremities are well perfused  Plan: PT/OT for ambulation/ foot drop brace Continue wound care as ordered Will reduce PCA Begin ambulation PICC line  [place] J [phone] 01/30/2011 7:45 AM   Addendum  I have independently interviewed and examined the patient, and I agree with the physician assistant's findings.  Left leg appears viable and perfused today.  Ant. compartment TTP on exam without swelling in the calf.  +  Foot drop without any DF.  Pt ok for tsfr to stepdown ICU.  H/H ok.  Load heparin then start Coumadin.  Echo demonstrates no intracardiac thrombus.  Adele Barthel, MD [facility] Office: [phone] Pager: [phone]  01/30/2011, 8:29 AM

## 2011-01-30 NOTE — Progress Notes (Signed)
ANTICOAGULATION CONSULT NOTE - Follow Up Consult  Pharmacy Consult for Heparin Indication: s/p L leg embolectomy  Allergies  Allergen Reactions  . Aspirin     Cannot take this medication due to kidney disease  . Ibuprofen     Kidney    Patient Measurements: Height: 5\' 1"  (154.9 cm) Weight: 125 lb (56.7 kg) IBW/kg (Calculated) : 47.8   Vital Signs: Temp: 98.8 F (37.1 C) (01/12 0759) Temp src: Oral (01/12 0759) BP: 99/45 mmHg (01/12 0800) Pulse Rate: 110  (01/12 0900)  Labs:  [name] 01/30/11 0810 01/29/11 2324 01/29/11 1834 01/29/11 1724 01/29/11 0337 01/28/11 0430 01/27/11 1033  HGB 9.8* -- 11.6* -- -- -- --  HCT 27.9* -- 33.1* -- 29.0* -- --  PLT 162 -- 178 -- 206 -- --  APTT -- -- -- -- 32 -- 25  LABPROT 14.1 -- -- -- 13.5 -- 12.5  INR 1.07 -- -- -- 1.01 -- 0.91  HEPARINUNFRC 0.10* <0.10* -- -- -- <0.10* --  CREATININE 1.00 -- -- 0.83 -- 1.00 --  CKTOTAL -- -- -- -- -- -- --  CKMB -- -- -- -- -- -- --  TROPONINI -- -- -- -- -- -- --   Estimated Creatinine Clearance: 55.9 ml/min (by C-G formula based on Cr of 1).   Medications:  Prescriptions prior to admission  Medication Sig Dispense Refill  . acetaminophen (TYLENOL) 500 MG tablet Take 1,000 mg by mouth every 6 (six) hours as needed. For pain       . ALPRAZolam (XANAX) 1 MG tablet Take 1 mg by mouth daily as needed. Anxiety      . diphenhydrAMINE (BENADRYL) 25 MG tablet Take 25 mg by mouth at bedtime as needed. For sleep      . fish oil-omega-3 fatty acids 1000 MG capsule Take 1 g by mouth daily.       . furosemide (LASIX) 20 MG tablet Take 20 mg by mouth 3 (three) times a week. On Mondays, Wednesdays, and Fridays       . insulin aspart (NOVOLOG) 100 UNIT/ML injection Inject 10 Units into the skin 3 (three) times daily before meals.  1 vial  12  . insulin glargine (LANTUS) 100 UNIT/ML injection Inject 30 Units into the skin at bedtime.  10 mL  12  . losartan (COZAAR) 25 MG tablet Take 12.5 mg by mouth daily.         . Multiple Vitamins-Minerals (MULTIVITAMINS THER. W/MINERALS) TABS Take 1 tablet by mouth daily.        . potassium chloride SA (K-DUR,KLOR-CON) 20 MEQ tablet Take 0.5 tablets (10 mEq total) by mouth daily.  7 tablet  0  . sertraline (ZOLOFT) 50 MG tablet Take 50 mg by mouth daily.          Assessment: 41 yo female with L leg ischemia s/p embolectomy 1/11 for Heparin.   Heparin level subtherapeutic @ 0.10.  No bolus given.  No bleeding noted, though Hgb 11.6-->9.8.    Goal of Therapy:  Heparin level 0.3-0.7 units/ml   Plan:  Increase Heparin 1450 units/hr Follow-up 6 hour HL @ 1630   Zachary Lovins E 01/30/2011,9:36 AM

## 2011-01-30 NOTE — Progress Notes (Signed)
Physical Therapy Evaluation Patient Details Name: [name] MRN: [id number] DOB: [birth year] Today's Date: 01/30/2011  Problem List:  Patient Active Problem List  Diagnoses  . Cellulitis and abscess of face  . Diabetes mellitus  . Glomerulonephritis  . Peripheral vascular disease in diabetes mellitus  . Hyperlipidemia  . Tobacco abuse  . Abnormal ECG  . Pre-operative cardiovascular examination    Past Medical History:  Past Medical History  Diagnosis Date  . Diabetes mellitus     Since Age 72  . Shingles     11/2010  . Renal disorder     glomerulonephritis  . Cellulitis     of face - 12/2010  . Peripheral vascular disease in diabetes mellitus   . Hyperlipidemia   . Tobacco abuse     0.5 - 1ppd x 21 yrs   Past Surgical History:  Past Surgical History  Procedure Date  . Wrist surgery     left  . Tubal ligation     PT Assessment/Plan/Recommendation PT Assessment Clinical Impression Statement: Pt s/p thrombectomy LLE with resultant edema and pain limiting mobility.  Pt too confused to clarify d/c environment or support at this time.  Will benefit from PT for continued mobillity training, LLE ROM, and assessing need for Lt AFO. PT Recommendation/Assessment: Patient will need skilled PT in the acute care venue PT Problem List: Decreased range of motion;Decreased activity tolerance;Decreased balance;Decreased mobility;Decreased cognition;Decreased knowledge of use of DME;Pain Barriers to Discharge: Other (comment) Barriers to Discharge Comments: unknown due to decr cognition PT Therapy Diagnosis : Difficulty walking;Acute pain;Altered mental status PT Plan PT Frequency: Min 3X/week PT Treatment/Interventions: DME instruction;Gait training;Stair training;Functional mobility training;Therapeutic activities;Therapeutic exercise;Cognitive remediation;Patient/family education PT Recommendation Recommendations for Other Services: OT consult Follow Up Recommendations: Other  (comment) (TBA as more known re: support and environment) Equipment Recommended: Other (comment) (TBA) PT Goals  Acute Rehab PT Goals PT Goal Formulation: Patient unable to participate in goal setting Time For Goal Achievement: 7 days Pt will go Supine/Side to Sit: with min assist;with HOB 0 degrees PT Goal: Supine/Side to Sit - Progress: Not met Pt will go Sit to Supine/Side: with min assist;with HOB 0 degrees PT Goal: Sit to Supine/Side - Progress: Not met Pt will go Sit to Stand: with min assist;with upper extremity assist PT Goal: Sit to Stand - Progress: Not met Pt will go Stand to Sit: with min assist;with upper extremity assist PT Goal: Stand to Sit - Progress: Not met Pt will Ambulate: 51 - 150 feet;with min assist;with least restrictive assistive device PT Goal: Ambulate - Progress: Not met Pt will Perform Home Exercise Program: with min assist PT Goal: Perform Home Exercise Program - Progress: Not met Additional Goals Additional Goal #1: Pt will perform self PROM/AAROM of Lt heelcord with pain <5/10 to at least 10 degrees of DF PT Goal: Additional Goal #1 - Progress: Not met  PT Evaluation Precautions/Restrictions  Precautions Precautions: Fall Precaution Comments: drain LLE; multiple lines/monitors due to ICU Required Braces or Orthoses: No Restrictions Weight Bearing Restrictions: No Other Position/Activity Restrictions: pt self-limits weight bearing on Lt due to pain Prior Functioning    Prior Function Comments: TBA-pt confused and no family present Cognition Cognition Arousal/Alertness: Awake/alert Overall Cognitive Status: Impaired Orientation Level: Oriented to person;Oriented to place;Oriented to situation Cognition - Other Comments: Pt stated there "was a girl standing right there...didn't you see her?" pointing to wall (no one present throughout session).  Calling out to various family members, also not present. Sensation/Coordination  Sensation Additional  Comments: LLE hypersensitive Extremity Assessment RLE Assessment RLE Assessment: Within Functional Limits LLE Assessment LLE Assessment: Exceptions to WFL LLE AROM (degrees) Overall AROM Left Lower Extremity: Deficits;Due to pain LLE Overall AROM Comments: + edema and pain; pt able to wiggle toes (1-3) very small amount; unable to actively dorsiflex and could not tolerate PROM to stretch heel cord (despite use of PCA x2); unable to get pt to put foot on floor to passively stretch Lt heel cord; explained importance of maintaining ROM Mobility (including Balance) Bed Mobility Bed Mobility: Yes Supine to Sit: 1: +2 Total assist;Patient percentage (comment);HOB elevated (Comment degrees) Supine to Sit Details (indicate cue type and reason): HOB 15; pt=60%; pt screamed with initial attempt to assist LLE and then decided to move it herself; assist to raise trunk off bed (pt unable to tolerate rolling to Lt side due to LLE pain) and to scoot pelvis around to EOB Sitting - Scoot to Edge of Bed: 2: Max assist Sitting - Scoot to [employer] of Bed Details (indicate cue type and reason): with bed pad under pelvis Transfers Transfers: Yes Sit to Stand: 1: +2 Total assist;Patient percentage (comment);With upper extremity assist;From bed Sit to Stand Details (indicate cue type and reason): pt= 60%; ICU bed with seat deflated; Pt initally pulling up on RW with bil UE; on 2nd stand pt with only 1 hand on RW (primarily using Rt forearm on RW due to IV in Rt hand); required assist to fully extend RLE to come to standing and pt selecting to be NWB on LLE Stand to Sit: 1: +2 Total assist;Patient percentage (comment);To bed;To chair/3-in-1;With upper extremity assist Stand to Sit Details: pt=60%; assist to control descent and fully pivot hips to align with chair Ambulation/Gait Ambulation/Gait: Yes Ambulation/Gait Assistance: 1: +2 Total assist;Patient percentage (comment) Ambulation/Gait Assistance Details (indicate  cue type and reason): pt=80%; hopping on RLE and NWB on LLE (due to pain) Ambulation Distance (Feet): 2 Feet Assistive device: Rolling walker Gait Pattern: Decreased step length - right;Left hip hike    Exercise    End of Session PT - End of Session Activity Tolerance: Patient limited by pain Patient left: in chair;with call bell in reach Nurse Communication: Mobility status for transfers;Mobility status for ambulation General Behavior During Session: Agitated (tearful and crying out; able to redirect for short periods)  Tashaya Ancrum 01/30/2011, 4:21 PM Pager [phone] -

## 2011-01-31 DIAGNOSIS — M79609 Pain in unspecified limb: Secondary | ICD-10-CM

## 2011-01-31 DIAGNOSIS — E1159 Type 2 diabetes mellitus with other circulatory complications: Principal | ICD-10-CM

## 2011-01-31 DIAGNOSIS — I798 Other disorders of arteries, arterioles and capillaries in diseases classified elsewhere: Secondary | ICD-10-CM

## 2011-01-31 LAB — CBC
HCT: 29.9 % — ABNORMAL LOW (ref 36.0–46.0)
Hemoglobin: 10.3 g/dL — ABNORMAL LOW (ref 12.0–15.0)
MCH: 30.2 pg (ref 26.0–34.0)
MCHC: 34.4 g/dL (ref 30.0–36.0)
MCV: 87.7 fL (ref 78.0–100.0)
Platelets: 206 10*3/uL (ref 150–400)
RBC: 3.41 MIL/uL — ABNORMAL LOW (ref 3.87–5.11)
RDW: 16.1 % — ABNORMAL HIGH (ref 11.5–15.5)
WBC: 11.4 10*3/uL — ABNORMAL HIGH (ref 4.0–10.5)

## 2011-01-31 LAB — BASIC METABOLIC PANEL
BUN: 12 mg/dL (ref 6–23)
CO2: 22 mEq/L (ref 19–32)
Calcium: 7.3 mg/dL — ABNORMAL LOW (ref 8.4–10.5)
Chloride: 106 mEq/L (ref 96–112)
Creatinine, Ser: 0.97 mg/dL (ref 0.50–1.10)
GFR calc Af Amer: 83 mL/min — ABNORMAL LOW (ref >90)
GFR calc non Af Amer: 72 mL/min — ABNORMAL LOW (ref >90)
Glucose, Bld: 65 mg/dL — ABNORMAL LOW (ref 70–99)
Potassium: 3.4 mEq/L — ABNORMAL LOW (ref 3.5–5.1)
Sodium: 136 mEq/L (ref 135–145)

## 2011-01-31 LAB — GLUCOSE, CAPILLARY
Glucose-Capillary: 61 mg/dL — ABNORMAL LOW (ref 70–99)
Glucose-Capillary: 64 mg/dL — ABNORMAL LOW (ref 70–99)
Glucose-Capillary: 73 mg/dL (ref 70–99)
Glucose-Capillary: 44 mg/dL — CL (ref 70–99)
Glucose-Capillary: 115 mg/dL — ABNORMAL HIGH (ref 70–99)
Glucose-Capillary: 75 mg/dL (ref 70–99)
Glucose-Capillary: 91 mg/dL (ref 70–99)
Glucose-Capillary: 79 mg/dL (ref 70–99)

## 2011-01-31 LAB — HEPARIN LEVEL (UNFRACTIONATED)
Heparin Unfractionated: 0.22 IU/mL — ABNORMAL LOW (ref 0.30–0.70)
Heparin Unfractionated: 0.45 IU/mL (ref 0.30–0.70)

## 2011-01-31 LAB — PROTIME-INR
INR: 1.36 (ref 0.00–1.49)
Prothrombin Time: 17 seconds — ABNORMAL HIGH (ref 11.6–15.2)

## 2011-01-31 MED ORDER — HALOPERIDOL LACTATE 5 MG/ML IJ SOLN: 5.0000 mg | Freq: Once | INTRAMUSCULAR | Status: DC

## 2011-01-31 MED ORDER — POTASSIUM CHLORIDE CRYS ER 20 MEQ PO TBCR
40.0000 meq | EXTENDED_RELEASE_TABLET | Freq: Once | ORAL | Status: AC
  Administered 2011-01-31: 40 meq via ORAL
  Filled 2011-01-31: qty 2

## 2011-01-31 MED ORDER — SODIUM CHLORIDE 0.9 % IJ SOLN
INTRAMUSCULAR | Status: AC
  Filled 2011-01-31: qty 10

## 2011-01-31 MED ORDER — WARFARIN SODIUM 7.5 MG PO TABS
7.5000 mg | ORAL_TABLET | Freq: Once | ORAL | Status: AC
  Administered 2011-01-31: 7.5 mg via ORAL
  Filled 2011-01-31: qty 1

## 2011-01-31 MED ORDER — HALOPERIDOL LACTATE 5 MG/ML IJ SOLN
5.0000 mg | Freq: Four times a day (QID) | INTRAMUSCULAR | Status: DC | PRN
  Filled 2011-01-31: qty 1

## 2011-01-31 MED ORDER — DEXTROSE 50 % IV SOLN
INTRAVENOUS | Status: AC
  Administered 2011-01-31: 25 mL
  Filled 2011-01-31: qty 50

## 2011-01-31 MED ORDER — HALOPERIDOL LACTATE 5 MG/ML IJ SOLN
INTRAMUSCULAR | Status: AC
  Filled 2011-01-31: qty 1

## 2011-01-31 MED ORDER — SODIUM CHLORIDE 0.9 % IJ SOLN
INTRAMUSCULAR | Status: AC
  Administered 2011-01-31: 10 mL
  Filled 2011-01-31: qty 10

## 2011-01-31 MED ORDER — DEXTROSE 50 % IV SOLN
INTRAVENOUS | Status: AC
  Administered 2011-01-31: 50 mL
  Filled 2011-01-31: qty 50

## 2011-01-31 NOTE — Progress Notes (Addendum)
[facility] [place]  Daily Progress Note  Assessment/Planning: POD #2 s/p L femoropopliteal and tibial thromboembolectomy   D/C JP  Heparin bridge to Coumadin  Patient confused currently likely multifactorial: Haldol prn, cutback on narcotic  Keep in stepdown ICU for now while sensorium clears  Subjective  - 2 Days Post-Op  Confused since early AM  Objective Filed Vitals:   01/30/11 2239 01/30/11 2300 01/31/11 0300 01/31/11 0400  BP: 109/42 103/47 120/54   Pulse: 101 101 104   Temp:  98.7 F (37.1 C) 98.6 F (37 C)   TempSrc:  Oral Oral   Resp:  12 10 14   Height:      Weight:      SpO2:  99% 100% 100%    Intake/Output Summary (Last 24 hours) at 01/31/11 0826 Last data filed at 01/31/11 0559  Gross per 24 hour  Intake 2223.02 ml  Output    905 ml  Net 1318.02 ml   GEN Combative, confused PULM  CTAB CV  RR, tachycardiac GI  soft, NTND VASC  L leg inc c/d/i, JP: 45 cc serosang, L foot warm with easily dopplerable signals throughout, +foot drop  Laboratory CBC    Component Value Date/Time   WBC 11.4* 01/31/2011 0600   HGB 10.3* 01/31/2011 0600   HCT 29.9* 01/31/2011 0600   PLT 206 01/31/2011 0600    BMET    Component Value Date/Time   NA 136 01/31/2011 0600   K 3.4* 01/31/2011 0600   CL 106 01/31/2011 0600   CO2 22 01/31/2011 0600   GLUCOSE 65* 01/31/2011 0600   BUN 12 01/31/2011 0600   CREATININE 0.97 01/31/2011 0600   CALCIUM 7.3* 01/31/2011 0600   GFRNONAA 72* 01/31/2011 0600   GFRAA 83* 01/31/2011 0600   Lab Results  Component Value Date   INR 1.07 01/30/2011   INR 1.01 01/29/2011   INR 0.91 01/27/2011   Adele Barthel, MD [facility] [place]: [phone] Pager: [phone]  01/31/2011, 8:26 AM

## 2011-01-31 NOTE — Progress Notes (Signed)
ANTICOAGULATION CONSULT NOTE - Follow Up Consult  Pharmacy Consult for Heparin Indication: s/p L leg embolectomy  Patient Measurements: Height: 5\' 1"  (154.9 cm) Weight: 125 lb (56.7 kg) IBW/kg (Calculated) : 47.8   Labs:  Basename 01/31/11 0039 01/30/11 1630 01/30/11 0810 01/29/11 1834 01/29/11 1724 01/29/11 0337 01/28/11 0430  HGB -- -- 9.8* 11.6* -- -- --  HCT -- -- 27.9* 33.1* -- 29.0* --  PLT -- -- 162 178 -- 206 --  APTT -- -- -- -- -- 32 --  LABPROT -- -- 14.1 -- -- 13.5 --  INR -- -- 1.07 -- -- 1.01 --  HEPARINUNFRC 0.22* <0.10* 0.10* -- -- -- --  CREATININE -- -- 1.00 -- 0.83 -- 1.00  CKTOTAL -- -- -- -- -- -- --  CKMB -- -- -- -- -- -- --  TROPONINI -- -- -- -- -- -- --   Estimated Creatinine Clearance: 55.9 ml/min (by C-G formula based on Cr of 1).  Assessment: 41 yo female with L leg ischemia s/p embolectomy 1/11 on  heparin.    Goal of Therapy:  Heparin level 0.3-0.7 units/ml   Plan:  Increase Heparin 1650 units/hr Check heparin level in 8 hours.  Caryl Pina 01/31/2011,2:21 AM

## 2011-01-31 NOTE — Progress Notes (Signed)
ANTICOAGULATION CONSULT NOTE - Follow Up Consult  Pharmacy Consult for Heparin/coumadin Indication: s/p L leg embolectomy  Allergies  Allergen Reactions  . Aspirin     Cannot take this medication due to kidney disease  . Ibuprofen     Kidney    Patient Measurements: Height: 5\' 1"  (154.9 cm) Weight: 125 lb (56.7 kg) IBW/kg (Calculated) : 47.8   Vital Signs: Temp: 98.4 F (36.9 C) (01/13 1300) Temp src: Oral (01/13 1300) BP: 102/55 mmHg (01/13 1300) Pulse Rate: 89  (01/13 1300)  Labs:  Basename 01/31/11 1251 01/31/11 0949 01/31/11 0600 01/31/11 0039 01/30/11 1630 01/30/11 0810 01/29/11 1834 01/29/11 1724 01/29/11 0337  HGB -- -- 10.3* -- -- 9.8* -- -- --  HCT -- -- 29.9* -- -- 27.9* 33.1* -- --  PLT -- -- 206 -- -- 162 178 -- --  APTT -- -- -- -- -- -- -- -- 32  LABPROT -- 17.0* -- -- -- 14.1 -- -- 13.5  INR -- 1.36 -- -- -- 1.07 -- -- 1.01  HEPARINUNFRC 0.45 -- -- 0.22* <0.10* -- -- -- --  CREATININE -- -- 0.97 -- -- 1.00 -- 0.83 --  CKTOTAL -- -- -- -- -- -- -- -- --  CKMB -- -- -- -- -- -- -- -- --  TROPONINI -- -- -- -- -- -- -- -- --   Estimated Creatinine Clearance: 57.6 ml/min (by C-G formula based on Cr of 0.97).   Medications:  Heparin infusion at 1650 units/hr  Assessment: 41 yo female with L leg ischemia s/p embolectomy 1/11 on heparin bridge to coumadin. Heparin level therapeutic after rate change this morning. 1 dose of coumadin 7.5mg  was given last night, INR trending up nicely. Cbc stable, no bleeding noted  Goal of Therapy:  INR 2-3 Heparin level 0.3-0.7 units/ml   Plan:  - Continue heparin infusion at 1650 units/hr - coumadin 7.5mg  po x 1 - f/u INR and heparin level Monday morning - pt. On lantus 30 units qhs PTA, which was resumed yesterday, patient had 2 episodes of hypoglycemia events early this morning, likely d/t poor PO intake s/p surgery. Please consider decrease lantus dose.   Manley Mason 01/31/2011,2:24 PM

## 2011-01-31 NOTE — Progress Notes (Signed)
   Subjective:  Patient is resting comfortably in bed.  Asking when she can go home.  Objective:  Vital Signs in the last 24 hours: Temp:  [98.3 F (36.8 C)-99.4 F (37.4 C)] 98.6 F (37 C) (01/13 0300) Pulse Rate:  [80-104] 101  (01/13 0835) Resp:  [10-18] 14  (01/13 0835) BP: (96-133)/(42-72) 102/44 mmHg (01/13 0835) SpO2:  [95 %-100 %] 96 % (01/13 0835)  Intake/Output from previous day: 01/12 0701 - 01/13 0700 In: 2342.5 [P.O.:500; I.V.:1742.5; IV Piggyback:100] Out: 945 [Urine:900; Drains:45] Intake/Output from this shift:       . dextrose      . docusate sodium  100 mg Oral Daily  . famotidine (PEPCID) IV  20 mg Intravenous Q12H  . fentaNYL   Intravenous Q4H  . furosemide  20 mg Oral 3 times weekly  . haloperidol lactate  5 mg Intravenous Once  . insulin aspart  0-15 Units Subcutaneous Q4H  . insulin aspart  10 Units Subcutaneous TID AC  . insulin glargine  30 Units Subcutaneous QHS  . losartan  12.5 mg Oral Daily  . metoprolol tartrate  12.5 mg Oral BID  . potassium chloride  40 mEq Oral Once  . potassium chloride  40 mEq Oral Once  . rosuvastatin  40 mg Oral q1800  . sertraline  50 mg Oral Daily  . sodium chloride  10 mL Intracatheter Q12H  . sodium chloride      . warfarin  7.5 mg Oral ONCE-1800  . DISCONTD: HYDROmorphone PCA 0.3 mg/mL   Intravenous Q4H  . DISCONTD: sodium chloride  3 mL Intravenous Q12H      . sodium chloride 500 mL (01/30/11 2038)  . heparin 16.5 mL/hr (01/31/11 0450)  . DISCONTD: DOPamine      Physical Exam: The patient appears to be in no distress. Lungs clear. Heart no gallop.  Rhythm regular NSR  Lab Results:  Basename 01/31/11 0600 01/30/11 0810  WBC 11.4* 11.8*  HGB 10.3* 9.8*  PLT 206 162    Basename 01/31/11 0600 01/30/11 0810  NA 136 134*  K 3.4* 3.4*  CL 106 104  CO2 22 21  GLUCOSE 65* 177*  BUN 12 14  CREATININE 0.97 1.00   No results found for this basename: TROPONINI:2,CK,MB:2 in the last 72  hours Hepatic Function Panel No results found for this basename: PROT,ALBUMIN,AST,ALT,ALKPHOS,BILITOT,BILIDIR,IBILI in the last 72 hours No results found for this basename: CHOL in the last 72 hours No results found for this basename: PROTIME in the last 72 hours  Imaging: Imaging results have been reviewed  Cardiac Studies:  Assessment/Plan:  Patient Active Hospital Problem List:    Severe peripheral vascular disease.    Satisfactory post-op course  Plan: Continue current cardiac meds. Will sign off now. Please call for questions.      LOS: 4 days    Darlin Coco 01/31/2011, 11:20 AM

## 2011-01-31 NOTE — Progress Notes (Signed)
01/21/11 nsg 0400  cbg 64,  Gave carb snack follow up cbg 61 repeated snack follow up cbg 73.  0720 cbg 44 gave 50 d50 iv Vic Esco, Darcel Smalling

## 2011-02-01 ENCOUNTER — Encounter (HOSPITAL_COMMUNITY): Payer: Self-pay | Admitting: Vascular Surgery

## 2011-02-01 DIAGNOSIS — Z48812 Encounter for surgical aftercare following surgery on the circulatory system: Secondary | ICD-10-CM

## 2011-02-01 LAB — CBC
HCT: 28.7 % — ABNORMAL LOW (ref 36.0–46.0)
Hemoglobin: 9.9 g/dL — ABNORMAL LOW (ref 12.0–15.0)
MCH: 30.1 pg (ref 26.0–34.0)
MCHC: 34.5 g/dL (ref 30.0–36.0)
MCV: 87.2 fL (ref 78.0–100.0)
Platelets: 220 10*3/uL (ref 150–400)
RBC: 3.29 MIL/uL — ABNORMAL LOW (ref 3.87–5.11)
RDW: 15.5 % (ref 11.5–15.5)
WBC: 9.8 10*3/uL (ref 4.0–10.5)

## 2011-02-01 LAB — GLUCOSE, CAPILLARY
Glucose-Capillary: 107 mg/dL — ABNORMAL HIGH (ref 70–99)
Glucose-Capillary: 68 mg/dL — ABNORMAL LOW (ref 70–99)
Glucose-Capillary: 93 mg/dL (ref 70–99)
Glucose-Capillary: 75 mg/dL (ref 70–99)
Glucose-Capillary: 83 mg/dL (ref 70–99)
Glucose-Capillary: 167 mg/dL — ABNORMAL HIGH (ref 70–99)
Glucose-Capillary: 48 mg/dL — ABNORMAL LOW (ref 70–99)
Glucose-Capillary: 56 mg/dL — ABNORMAL LOW (ref 70–99)
Glucose-Capillary: 88 mg/dL (ref 70–99)
Glucose-Capillary: 146 mg/dL — ABNORMAL HIGH (ref 70–99)

## 2011-02-01 LAB — PROTIME-INR
INR: 2.65 — ABNORMAL HIGH (ref 0.00–1.49)
Prothrombin Time: 28.7 seconds — ABNORMAL HIGH (ref 11.6–15.2)

## 2011-02-01 LAB — BASIC METABOLIC PANEL
BUN: 8 mg/dL (ref 6–23)
CO2: 22 mEq/L (ref 19–32)
Calcium: 7.5 mg/dL — ABNORMAL LOW (ref 8.4–10.5)
Chloride: 110 mEq/L (ref 96–112)
Creatinine, Ser: 1.04 mg/dL (ref 0.50–1.10)
GFR calc Af Amer: 76 mL/min — ABNORMAL LOW (ref >90)
GFR calc non Af Amer: 66 mL/min — ABNORMAL LOW (ref >90)
Glucose, Bld: 72 mg/dL (ref 70–99)
Potassium: 3.5 mEq/L (ref 3.5–5.1)
Sodium: 140 mEq/L (ref 135–145)

## 2011-02-01 LAB — HEPARIN LEVEL (UNFRACTIONATED): Heparin Unfractionated: 0.22 IU/mL — ABNORMAL LOW (ref 0.30–0.70)

## 2011-02-01 MED ORDER — GLUCOSE 40 % PO GEL
ORAL | Status: AC
  Administered 2011-02-01: 37.5 g
  Filled 2011-02-01: qty 1

## 2011-02-01 MED ORDER — SODIUM CHLORIDE 0.9 % IJ SOLN
INTRAMUSCULAR | Status: AC
  Administered 2011-02-01: 10 mL
  Filled 2011-02-01: qty 10

## 2011-02-01 NOTE — Progress Notes (Signed)
Inpatient Diabetes Program Recommendations  AACE/ADA: New Consensus Statement on Inpatient Glycemic Control (2009)  Target Ranges:  Prepandial:   less than 140 mg/dL      Peak postprandial:   less than 180 mg/dL (1-2 hours)      Critically ill patients:  140 - 180 mg/dL   Reason for Visit: Note history of diabetes since age 41. Pt is on PO diet.  Inpatient Diabetes Program Recommendations Insulin - Basal: . Correction (SSI): Please change Novolog correction scale schedule to tid with meals.

## 2011-02-01 NOTE — Progress Notes (Signed)
*  PRELIMINARY RESULTS* VASCULAR LAB PRELIMINARY  PRELIMINARY  PRELIMINARY  PRELIMINARY  ARTERIAL  ABI completed:    RIGHT    LEFT    PRESSURE WAVEFORM  PRESSURE WAVEFORM  BRACHIAL 122 Triphasic BRACHIAL PICC line Triphasic  DP 117 Triphasic DP 51 Monophasic  PT 127 Biphasic PT 126 Triphasic                         RIGHT LEFT  ABI 1.04 1.03      Yalanda Soderman, RVS, [place] D. 02/01/2011, 8:13 AM

## 2011-02-01 NOTE — Progress Notes (Signed)
ANTICOAGULATION CONSULT NOTE - Follow Up Consult  Pharmacy Consult for Heparin & Coumadin Indication: s/p Left Leg Embolectomy  Allergies  Allergen Reactions  . Aspirin     Cannot take this medication due to kidney disease  . Ibuprofen     Kidney    Vital Signs: Temp: 98.4 F (36.9 C) (01/14 0816) Temp src: Oral (01/14 0816) BP: 121/63 mmHg (01/14 0918) Pulse Rate: 96  (01/14 0918)  Labs:  Basename 02/01/11 0515 01/31/11 1251 01/31/11 0949 01/31/11 0600 01/31/11 0039 01/30/11 0810  HGB 9.9* -- -- 10.3* -- --  HCT 28.7* -- -- 29.9* -- 27.9*  PLT 220 -- -- 206 -- 162  APTT -- -- -- -- -- --  LABPROT 28.7* -- 17.0* -- -- 14.1  INR 2.65* -- 1.36 -- -- 1.07  HEPARINUNFRC 0.22* 0.45 -- -- 0.22* --  CREATININE 1.04 -- -- 0.97 -- 1.00  CKTOTAL -- -- -- -- -- --  CKMB -- -- -- -- -- --  TROPONINI -- -- -- -- -- --   Estimated Creatinine Clearance: 53.7 ml/min (by C-G formula based on Cr of 1.04).   Medications:  Scheduled:    . dextrose      . docusate sodium  100 mg Oral Daily  . famotidine (PEPCID) IV  20 mg Intravenous Q12H  . furosemide  20 mg Oral 3 times weekly  . haloperidol lactate  5 mg Intravenous Once  . insulin aspart  0-15 Units Subcutaneous Q4H  . insulin aspart  10 Units Subcutaneous TID AC  . insulin glargine  30 Units Subcutaneous QHS  . losartan  12.5 mg Oral Daily  . metoprolol tartrate  12.5 mg Oral BID  . rosuvastatin  40 mg Oral q1800  . sertraline  50 mg Oral Daily  . sodium chloride  10 mL Intracatheter Q12H  . warfarin  7.5 mg Oral ONCE-1800  . DISCONTD: fentaNYL   Intravenous Q4H    Assessment: 41yo female s/p L-leg embolectomy on Coumadin.  INR with significant jump to 2.65 this AM, after 7.5mg  x 2 doses.  Heparin has been stopped.  No bleeding problems noted.  Goal of Therapy:  INR 2-3   Plan:  1.  No Coumadin today 2.  D/C daily Heparin level & CBC 3.  F/U INR  Traeger Sultana P 02/01/2011,1:45 PM

## 2011-02-01 NOTE — Progress Notes (Addendum)
[facility] Progress Note  02/01/2011 7:31 AM POD 3  Wants to know when she can transfer out of this unit.  Otherwise, states she has some generalized pain in her left foot.  Tm 99 now 98.8   98%RA Filed Vitals:   02/01/11 0400  BP: 113/65  Pulse: 90  Temp: 98.8 F (37.1 C)  Resp: 12    Incisions:  C/d/i without drainage. Extremities:  Left foot warm and well perfused.  Good doppler signal in the left PT/DP.  Mild edema left foot.  CBC    Component Value Date/Time   WBC 9.8 02/01/2011 0515   RBC 3.29* 02/01/2011 0515   HGB 9.9* 02/01/2011 0515   HCT 28.7* 02/01/2011 0515   PLT 220 02/01/2011 0515   MCV 87.2 02/01/2011 0515   MCH 30.1 02/01/2011 0515   MCHC 34.5 02/01/2011 0515   RDW 15.5 02/01/2011 0515   LYMPHSABS 3.9 01/27/2011 1033   MONOABS 0.9 01/27/2011 1033   EOSABS 0.1 01/27/2011 1033   BASOSABS 0.0 01/27/2011 1033    BMET    Component Value Date/Time   NA 140 02/01/2011 0515   K 3.5 02/01/2011 0515   CL 110 02/01/2011 0515   CO2 22 02/01/2011 0515   GLUCOSE 72 02/01/2011 0515   BUN 8 02/01/2011 0515   CREATININE 1.04 02/01/2011 0515   CALCIUM 7.5* 02/01/2011 0515   GFRNONAA 66* 02/01/2011 0515   GFRAA 76* 02/01/2011 0515    INR    Component Value Date/Time   INR 2.65* 02/01/2011 0515     Intake/Output Summary (Last 24 hours) at 02/01/11 0731 Last data filed at 02/01/11 0500  Gross per 24 hour  Intake    780 ml  Output   2000 ml  Net  -1220 ml   JP Drain 100cc/24hrs  Assessment/Plan:  41 y.o. female is s/p L femoropopliteal and tibial thromboembolectomy   POD 3  -Significant increase with one dose of coumadin from 1.07 to 2.65 with one dose of coumadin 7.5mg .  No coumadin today.  D/c heparin gtt.  Daily INR -confusion improved. -JP drain with 100 cc out over 24 hrs--? D/c drain.  Will d/w Dr. Kellie Simmering. -probably transfer to 2000 today. -d/c strict bedrest--OOB to chair TID -d/c foley catheter tomorrow when pt mobilizing more -ABIs being done  now.  Evorn Gong, PA-C [facility] [phone] 02/01/2011 7:31 AM   3+ PT pulse palpable. Foot drop. Foot warm and pink    Tx 2000 D/C foley today D/C heparin PT today Hope to D/C home  Wed if can get off of IV pain meds

## 2011-02-01 NOTE — Progress Notes (Signed)
Physical Therapy Treatment Patient Details Name: [name] MRN: [id number] DOB: 04/09/1970 Today's Date: 02/01/2011  PT Assessment/Plan  PT - Assessment/Plan Comments on Treatment Session: Patient remains unable to ambulate greater than approx 3 feet secondary to pain. With ROM with PT she had decreased sensitivity to touch and so encouraged this on own. Also post P-ROM with PT patient with traces of all movements of foot and ankle and encouraged A-ROM as able. I feel at this time patient is significantly self limiting her actvivity Follow Up Recommendations: Home health PT;Supervision/Assistance - 24 hour Equipment Recommended: Rolling walker with 5" wheels PT Goals  Acute Rehab PT Goals PT Goal: Supine/Side to Sit - Progress: Met PT Goal: Sit to Stand - Progress: Met PT Goal: Stand to Sit - Progress: Met PT Goal: Ambulate - Progress: Progressing toward goal PT Goal: Perform Home Exercise Program - Progress: Progressing toward goal  PT Treatment Precautions/Restrictions  Precautions Precautions: Fall Precaution Comments: drain LLE; multiple lines/monitors due to ICU Required Braces or Orthoses: No Restrictions Weight Bearing Restrictions: No Other Position/Activity Restrictions: pt self-limits weight bearing on Lt due to pain Mobility (including Balance) Bed Mobility Supine to Sit: 6: Modified independent (Device/Increase time) Sitting - Scoot to Edge of Bed: 6: Modified independent (Device/Increase time) Transfers Sit to Stand: 4: Min assist;With upper extremity assist;From bed Sit to Stand Details (indicate cue type and reason): Min-guard assistance For safety. Educated on correct hand placement to and from device. No weight bearing on left lower extremity Stand to Sit: 5: Supervision;With upper extremity assist;To chair/3-in-1 Stand to Sit Details: Heavy upper extremity reliance with no weight bearing left lower extremity. Ambulation/Gait Ambulation/Gait Assistance: 4:  Min assist Ambulation/Gait Assistance Details (indicate cue type and reason): Min-guard assistance - patient refuses to weight bear left lower extremity secondary to pain. NWB L-LE Ambulation Distance (Feet): 3 Feet Assistive device: Rolling walker    Exercise  General Exercises - Lower Extremity Ankle Circles/Pumps: PROM;Left;10 reps;Supine (Educated in self P-ROM exercises with sheet. ). With end range hold for at least 10 seconds End of Session PT - End of Session Equipment Utilized During Treatment: Gait belt Activity Tolerance: Patient limited by pain Patient left: in chair;with call bell in reach;with family/visitor present Nurse Communication: Mobility status for transfers General Behavior During Session: [facility] for tasks performed Cognition: [facility] for tasks performed  Jake Bathe, PT  Pager [phone]  02/01/2011, 1:34 PM

## 2011-02-01 NOTE — Progress Notes (Signed)
2306: CBG = 68 2308: Gave patient 25 ml of dextrose 2320: CBG = 107 2325: Notified Dr. Bridgett Larsson, He confirmed, no new orders.

## 2011-02-02 LAB — CBC
HCT: 29.5 % — ABNORMAL LOW (ref 36.0–46.0)
Hemoglobin: 9.9 g/dL — ABNORMAL LOW (ref 12.0–15.0)
MCH: 30 pg (ref 26.0–34.0)
MCHC: 33.6 g/dL (ref 30.0–36.0)
MCV: 89.4 fL (ref 78.0–100.0)
Platelets: 274 10*3/uL (ref 150–400)
RBC: 3.3 MIL/uL — ABNORMAL LOW (ref 3.87–5.11)
RDW: 15.4 % (ref 11.5–15.5)
WBC: 10.6 10*3/uL — ABNORMAL HIGH (ref 4.0–10.5)

## 2011-02-02 LAB — GLUCOSE, CAPILLARY
Glucose-Capillary: 246 mg/dL — ABNORMAL HIGH (ref 70–99)
Glucose-Capillary: 98 mg/dL (ref 70–99)
Glucose-Capillary: 168 mg/dL — ABNORMAL HIGH (ref 70–99)
Glucose-Capillary: 292 mg/dL — ABNORMAL HIGH (ref 70–99)
Glucose-Capillary: 44 mg/dL — CL (ref 70–99)
Glucose-Capillary: 59 mg/dL — ABNORMAL LOW (ref 70–99)
Glucose-Capillary: 66 mg/dL — ABNORMAL LOW (ref 70–99)
Glucose-Capillary: 100 mg/dL — ABNORMAL HIGH (ref 70–99)
Glucose-Capillary: 323 mg/dL — ABNORMAL HIGH (ref 70–99)

## 2011-02-02 LAB — PROTIME-INR
INR: 3.66 — ABNORMAL HIGH (ref 0.00–1.49)
Prothrombin Time: 36.9 seconds — ABNORMAL HIGH (ref 11.6–15.2)

## 2011-02-02 MED ORDER — FAMOTIDINE 20 MG PO TABS
20.0000 mg | ORAL_TABLET | Freq: Two times a day (BID) | ORAL | Status: DC
  Administered 2011-02-02 – 2011-02-04 (×5): 20 mg via ORAL
  Filled 2011-02-02 (×7): qty 1

## 2011-02-02 MED ORDER — INSULIN ASPART 100 UNIT/ML ~~LOC~~ SOLN
0.0000 [IU] | Freq: Three times a day (TID) | SUBCUTANEOUS | Status: DC
  Administered 2011-02-02: 8 [IU] via SUBCUTANEOUS
  Administered 2011-02-03: 2 [IU] via SUBCUTANEOUS
  Administered 2011-02-03: 3 [IU] via SUBCUTANEOUS

## 2011-02-02 MED ORDER — PREGABALIN 25 MG PO CAPS
25.0000 mg | ORAL_CAPSULE | Freq: Two times a day (BID) | ORAL | Status: DC
  Administered 2011-02-02 (×2): 25 mg via ORAL
  Filled 2011-02-02 (×2): qty 1

## 2011-02-02 MED ORDER — HYDROMORPHONE HCL 2 MG PO TABS
2.0000 mg | ORAL_TABLET | ORAL | Status: DC | PRN
  Administered 2011-02-02 – 2011-02-04 (×9): 4 mg via ORAL
  Filled 2011-02-02 (×10): qty 2

## 2011-02-02 NOTE — Progress Notes (Signed)
Pt smokes 1 1/2 ppd and has never quit before. She is in action stage. Pt voices understanding of the risks associated with her smoking and verbalizes them when asked. She also requests patch instructions. Recommended 35 mg patch to start with and wrote down patch dosages and duration for pt. Discussed patch use directions in detail. Referred to 1-800 quit now for f/u and support. Discussed oral fixation substitutes, second hand smoke and in home smoking policy. Reviewed and gave pt Written education/contact information.

## 2011-02-02 NOTE — Progress Notes (Signed)
Physical Therapy Treatment Patient Details Name: [name] MRN: [id number] DOB: 24-Jan-1970 Today's Date: 02/02/2011  PT Assessment/Plan  PT - Assessment/Plan Comments on Treatment Session: Pt s/p LLE thrombectomy and continues have limited ability tolerating weight on LLE but increased today from prior sessions. Pt encouraged to continue HEP and to ambulate to bathroom with nursing. PT Plan: Discharge plan remains appropriate;Frequency remains appropriate PT Goals  Acute Rehab PT Goals Pt will go Supine/Side to Sit: Independently;with HOB 0 degrees PT Goal: Supine/Side to Sit - Progress: Updated due to goal met Pt will go Sit to Supine/Side: with modified independence PT Goal: Sit to Supine/Side - Progress: Updated due to goal met Pt will go Sit to Stand: with modified independence PT Goal: Sit to Stand - Progress: Updated due to goal met Pt will go Stand to Sit: with modified independence PT Goal: Stand to Sit - Progress: Updated due to goals met Pt will Ambulate: >150 feet;with modified independence;with least restrictive assistive device PT Goal: Ambulate - Progress: Updated due to goal met Pt will Perform Home Exercise Program: Independently PT Goal: Perform Home Exercise Program - Progress: Updated due to goal met Additional Goals PT Goal: Additional Goal #1 - Progress: Progressing toward goal  PT Treatment Precautions/Restrictions  Precautions Precautions: Fall Precaution Comments: no Required Braces or Orthoses: No Restrictions Weight Bearing Restrictions: No Other Position/Activity Restrictions: no Mobility (including Balance) Bed Mobility Supine to Sit: 6: Modified independent (Device/Increase time);With rails;HOB flat Supine to Sit Details (indicate cue type and reason): 0 Sitting - Scoot to Edge of Bed: 6: Modified independent (Device/Increase time) Transfers Sit to Stand: 4: Min assist;From bed Sit to Stand Details (indicate cue type and reason): cues for  hand placement and safety, minguard Stand to Sit: 5: Supervision;To chair/3-in-1;With armrests Ambulation/Gait Ambulation/Gait Assistance: 4: Min assist Ambulation/Gait Assistance Details (indicate cue type and reason): pt progressed min assist to min guard throughout along with increase from NWBLLE to PWB LLE with increased distance Ambulation Distance (Feet): 120 Feet Assistive device: Rolling walker Gait Pattern: Step-to pattern;Decreased hip/knee flexion - left Stairs: No    Exercise  General Exercises - Lower Extremity Long Arc Quad: AROM;20 reps;Seated;Left Hip Flexion/Marching: AROM;Left;Other reps (comment);Seated (20reps) Toe Raises: AROM;Left;10 reps;Seated (minimal movement but encouraged ) End of Session PT - End of Session Equipment Utilized During Treatment: Gait belt Activity Tolerance: Patient tolerated treatment well Patient left: in chair;with call bell in reach Nurse Communication: Mobility status for transfers;Mobility status for ambulation General Behavior During Session: [facility] for tasks performed Cognition: [facility] for tasks performed  Melford Aase 02/02/2011, 3:59 PM Lanetta Inch, [place]

## 2011-02-02 NOTE — Progress Notes (Signed)
Patient fell tonight while trying to "walk laps" she says.  She wants to be able to do everything on her own she says. She was not injured, just crying because she wants to be able to walk by herself.  Dr. Oneida Alar  made aware right after fall.  No new orders at this time.  Post fall vitals: 98.9, 80, 19, 124/67, 96 % RA.  Patient placed on bed alarm and chair alarm while up in chair.  Call bell within reach and educated about calling for help.  Will continue to monitor patient and vital signs every four hours.

## 2011-02-02 NOTE — Progress Notes (Addendum)
[facility] [place]  Progress Note   Date of Surgery: 01/27/2011 - 01/29/2011  Procedure(s): Tibial EMBOLECTOMY left leg Surgeon: Surgeon(s): Mal Misty, MD  4 Days Post-Op  History of Present Illness  [name] is a 41 y.o. female who is S/P Procedure(s): tibial EMBOLECTOMY left.  The patient's pre-op symptoms of pain, numbness  are Improved somewhat. She can wiggle her toes minimally. She states pain in worse in dependent position. Patients pain is not well controlled. But better with Lyrica and changed percocet to dilaudid po.   VASC. LAB Studies:        ABI: Right 1.04;  Left 1.03;  Significant Diagnostic Studies: CBC    Component Value Date/Time   WBC 10.6* 02/02/2011 0527   RBC 3.30* 02/02/2011 0527   HGB 9.9* 02/02/2011 0527   HCT 29.5* 02/02/2011 0527   PLT 274 02/02/2011 0527   MCV 89.4 02/02/2011 0527   MCH 30.0 02/02/2011 0527   MCHC 33.6 02/02/2011 0527   RDW 15.4 02/02/2011 0527   LYMPHSABS 3.9 01/27/2011 1033   MONOABS 0.9 01/27/2011 1033   EOSABS 0.1 01/27/2011 1033   BASOSABS 0.0 01/27/2011 1033    BMET    Component Value Date/Time   NA 140 02/01/2011 0515   K 3.5 02/01/2011 0515   CL 110 02/01/2011 0515   CO2 22 02/01/2011 0515   GLUCOSE 72 02/01/2011 0515   BUN 8 02/01/2011 0515   CREATININE 1.04 02/01/2011 0515   CALCIUM 7.5* 02/01/2011 0515   GFRNONAA 66* 02/01/2011 0515   GFRAA 76* 02/01/2011 0515    COAG Lab Results  Component Value Date   INR 3.66* 02/02/2011   INR 2.65* 02/01/2011   INR 1.36 01/31/2011   No results found for this basename: PTT    Physical Examination  BP Readings from Last 3 Encounters:  02/02/11 116/60  02/02/11 116/60  02/02/11 116/60   Temp Readings from Last 3 Encounters:  02/02/11 98.9 F (37.2 C) Oral  02/02/11 98.9 F (37.2 C) Oral  02/02/11 98.9 F (37.2 C) Oral   SpO2 Readings from Last 3 Encounters:  02/02/11 94%  02/02/11 94%  02/02/11 94%   Pulse Readings from Last 3 Encounters:    02/02/11 85  02/02/11 85  02/02/11 85    Pt is A&O x 3 left lower extremity: Incision/s is/are clean,dry.intact, and  healing without hematoma, erythema or drainage Limb is warm; with good color Some tenderness over ant compartment - unchanged Pain to touch in left foot better Slight movement at ankle  Left Dorsalis Pedis pulse is monophasic by Doppler and palpable LeftPosterior tibial pulse is  palpable  Assessment: Pt. Doing well Post-op pain is controlled on lyrica and po dilaudid Wounds are healing well Bypass is open and extremities are well perfused Still has foot drop  Plan: PT/OT for ambulation Continue wound care as ordered  [name] [phone] 02/02/2011 1:26 PM  Agree with above. 3+ posterior tibial pulse palpable left ankle. Foot is warm and well-perfused but does have duskiness of fourth and fifth toes distally. Decreased dorsiflexion with foot drop. Popliteal wound is healing nicely. INR 3.6. Please patient will need another 24-48 hours for pain control. Needs to continue physical therapy to work on dorsiflexion left foot and possible discharge with foot drop brace left ankle. Will plan on discharge home on Thursday. Coumadin will be regulated by Dr. Velvet Bathe medical doctor

## 2011-02-02 NOTE — Progress Notes (Signed)
CBG: 44  Treatment: 15 GM carbohydrate snack Coke, peanut butter, OJ  Symptoms: None NONE  Follow-up CBG: [id number] CBG Result:59  Possible Reasons for Event: Medication regimen: Meal coverage  Comments/MD notified:[facility] on call MD    Margarette Canada

## 2011-02-02 NOTE — Progress Notes (Signed)
ANTICOAGULATION CONSULT NOTE - Follow Up Consult  Pharmacy Consult for warfarin Indication: s/p left leg embolectomy  Vital Signs: Temp: 98.9 F (37.2 C) (01/15 0438) Temp src: Oral (01/15 0438) BP: 116/60 mmHg (01/15 0438) Pulse Rate: 85  (01/15 0438)  Labs:  Basename 02/02/11 0527 02/01/11 0515 01/31/11 1251 01/31/11 0949 01/31/11 0600 01/31/11 0039  HGB 9.9* 9.9* -- -- -- --  HCT 29.5* 28.7* -- -- 29.9* --  PLT 274 220 -- -- 206 --  APTT -- -- -- -- -- --  LABPROT 36.9* 28.7* -- 17.0* -- --  INR 3.66* 2.65* -- 1.36 -- --  HEPARINUNFRC -- 0.22* 0.45 -- -- 0.22*  CREATININE -- 1.04 -- -- 0.97 --  CKTOTAL -- -- -- -- -- --  CKMB -- -- -- -- -- --  TROPONINI -- -- -- -- -- --   Estimated Creatinine Clearance: 53.7 ml/min (by C-G formula based on Cr of 1.04).  Assessment: 77 yof s/p L-leg embolectomy on coumadin. INR is now supratherapeutic after rapid increases and despite held dose yesterday. Today INR is 3.66. No bleeding noted and CBC is stable.   Goal of Therapy:  INR 2-3   Plan:  1. No coumadin today 2. F/u AM INR  [name], Rande Lawman 02/02/2011,10:14 AM

## 2011-02-02 NOTE — Progress Notes (Signed)
CBG: 59  Treatment: 15 GM carbohydrate snack  Symptoms: None  Follow-up CBG: Time:1720 CBG Result:64  Possible Reasons for Event: Inadequate meal intake  Comments/MD notified:MD aware, stopped meal coverage insulin    Margarette Canada

## 2011-02-02 NOTE — Progress Notes (Signed)
Utilization review completed. Rozanna Boer, RN, BSN. 02/02/11

## 2011-02-03 LAB — GLUCOSE, CAPILLARY
Glucose-Capillary: 134 mg/dL — ABNORMAL HIGH (ref 70–99)
Glucose-Capillary: 51 mg/dL — ABNORMAL LOW (ref 70–99)
Glucose-Capillary: 56 mg/dL — ABNORMAL LOW (ref 70–99)
Glucose-Capillary: 91 mg/dL (ref 70–99)
Glucose-Capillary: 171 mg/dL — ABNORMAL HIGH (ref 70–99)
Glucose-Capillary: 140 mg/dL — ABNORMAL HIGH (ref 70–99)

## 2011-02-03 LAB — CBC
HCT: 29.2 % — ABNORMAL LOW (ref 36.0–46.0)
Hemoglobin: 9.7 g/dL — ABNORMAL LOW (ref 12.0–15.0)
MCH: 29.7 pg (ref 26.0–34.0)
MCHC: 33.2 g/dL (ref 30.0–36.0)
MCV: 89.3 fL (ref 78.0–100.0)
Platelets: 314 10*3/uL (ref 150–400)
RBC: 3.27 MIL/uL — ABNORMAL LOW (ref 3.87–5.11)
RDW: 15.1 % (ref 11.5–15.5)
WBC: 9.8 10*3/uL (ref 4.0–10.5)

## 2011-02-03 LAB — PROTIME-INR
INR: 2.21 — ABNORMAL HIGH (ref 0.00–1.49)
Prothrombin Time: 24.9 seconds — ABNORMAL HIGH (ref 11.6–15.2)

## 2011-02-03 MED ORDER — WARFARIN SODIUM 5 MG PO TABS
5.0000 mg | ORAL_TABLET | Freq: Once | ORAL | Status: AC
  Administered 2011-02-03: 5 mg via ORAL
  Filled 2011-02-03: qty 1

## 2011-02-03 MED ORDER — PREGABALIN 50 MG PO CAPS
50.0000 mg | ORAL_CAPSULE | Freq: Two times a day (BID) | ORAL | Status: DC
  Administered 2011-02-03 – 2011-02-04 (×3): 50 mg via ORAL
  Filled 2011-02-03 (×3): qty 1

## 2011-02-03 MED ORDER — GLUCOSE 40 % PO GEL
ORAL | Status: AC
  Administered 2011-02-03: 12:00:00
  Filled 2011-02-03: qty 1

## 2011-02-03 MED ORDER — DIPHENHYDRAMINE HCL 25 MG PO CAPS
25.0000 mg | ORAL_CAPSULE | Freq: Four times a day (QID) | ORAL | Status: DC | PRN
  Administered 2011-02-03: 25 mg via ORAL
  Filled 2011-02-03: qty 1

## 2011-02-03 NOTE — Progress Notes (Addendum)
[facility] Progress Note  02/03/2011 8:51 AM POD 4 Procedure(s): Tibial  EMBOLECTOMY left leg    Filed Vitals:   02/03/11 0414  BP: 109/58  Pulse: 88  Temp: 98.4 F (36.9 C)  Resp: 19    Incisions:  Clean dry and intact, min. Drainage superior to the knee. Extremities:    CBC    Component Value Date/Time   WBC 9.8 02/03/2011 0546   RBC 3.27* 02/03/2011 0546   HGB 9.7* 02/03/2011 0546   HCT 29.2* 02/03/2011 0546   PLT 314 02/03/2011 0546   MCV 89.3 02/03/2011 0546   MCH 29.7 02/03/2011 0546   MCHC 33.2 02/03/2011 0546   RDW 15.1 02/03/2011 0546   LYMPHSABS 3.9 01/27/2011 1033   MONOABS 0.9 01/27/2011 1033   EOSABS 0.1 01/27/2011 1033   BASOSABS 0.0 01/27/2011 1033    BMET    Component Value Date/Time   NA 140 02/01/2011 0515   K 3.5 02/01/2011 0515   CL 110 02/01/2011 0515   CO2 22 02/01/2011 0515   GLUCOSE 72 02/01/2011 0515   BUN 8 02/01/2011 0515   CREATININE 1.04 02/01/2011 0515   CALCIUM 7.5* 02/01/2011 0515   GFRNONAA 66* 02/01/2011 0515   GFRAA 76* 02/01/2011 0515    INR    Component Value Date/Time   INR 2.21* 02/03/2011 0546     Intake/Output Summary (Last 24 hours) at 02/03/11 0851 Last data filed at 02/03/11 [id number]  Gross per 24 hour  Intake    480 ml  Output   1500 ml  Net  -1020 ml     Assessment/Plan:  41 y.o. female is s/p   Procedure(s): Tibial  EMBOLECTOMY left leg Pending home health needs, anticoagulation to be therapeutic, and AFO from [employer].  Possible D/C home in AM. Lisa Gong, PA-C [facility] [phone] 02/03/2011 8:51 AM           agree with plan. Left leg wound healing nicely. 3+ posterior tibial pulse palpable. Fourth and fifth toes are dusky distally but not infected or frankly gangrenous. Motion and left ankle is improved but continues to have footdrop. Plan discharge in a.m. With outpatient physical therapy if possible to work on range of motion left foot and ambulation. Coumadin  will be managed by Dr. Luan Pulling in [place]

## 2011-02-03 NOTE — Progress Notes (Signed)
CBG: 51  Treatment: 1 tube instant glucose  Symptoms: Nervous/irritable  Follow-up CBG: Time:1205 CBG Result:56  Possible Reasons for Event: Inadequate meal intake  Comments/MD notified:Will continue to monitor. Glucose gel given    Margarette Canada

## 2011-02-03 NOTE — Progress Notes (Signed)
ANTICOAGULATION CONSULT NOTE - Follow Up Consult  Pharmacy Consult for warfarin Indication: s/p left leg embolectomy  Assessment: 70 yof on coumadin s/p left leg embolectomy. INR today is therapeutic at 2.21. No bleeding noted and CBC is stable. INR has been extremely variable after initially only receiving 2 doses of coumadin 7.5mg  (1.07 ->1.36 ->2.65 ->3.66 ->2.21). Unclear what dosing requirements will be. Possibility of pts INR becoming subtherapeutic tomorrow due to large drop from yesterday. Will give dose earlier in the day today.   Goal of Therapy:  INR 2-3   Plan:  1. Coumadin 5mg  PO x 1 at noon today  2. F/u AM INR  Vital Signs: Temp: 98.4 F (36.9 C) (01/16 0414) Temp src: Oral (01/16 0414) BP: 109/58 mmHg (01/16 0414) Pulse Rate: 88  (01/16 0414)  Labs:  Basename 02/03/11 0546 02/02/11 0527 02/01/11 0515 01/31/11 1251  HGB 9.7* 9.9* -- --  HCT 29.2* 29.5* 28.7* --  PLT 314 274 220 --  APTT -- -- -- --  LABPROT 24.9* 36.9* 28.7* --  INR 2.21* 3.66* 2.65* --  HEPARINUNFRC -- -- 0.22* 0.45  CREATININE -- -- 1.04 --  CKTOTAL -- -- -- --  CKMB -- -- -- --  TROPONINI -- -- -- --   Estimated Creatinine Clearance: 53.7 ml/min (by C-G formula based on Cr of 1.04).  [name], [name] 02/03/2011,10:32 AM

## 2011-02-04 LAB — GLUCOSE, CAPILLARY
Glucose-Capillary: 82 mg/dL (ref 70–99)
Glucose-Capillary: 103 mg/dL — ABNORMAL HIGH (ref 70–99)
Glucose-Capillary: 54 mg/dL — ABNORMAL LOW (ref 70–99)
Glucose-Capillary: 106 mg/dL — ABNORMAL HIGH (ref 70–99)

## 2011-02-04 LAB — PROTIME-INR
INR: 2.9 — ABNORMAL HIGH (ref 0.00–1.49)
Prothrombin Time: 30.8 seconds — ABNORMAL HIGH (ref 11.6–15.2)

## 2011-02-04 LAB — CBC
HCT: 32.5 % — ABNORMAL LOW (ref 36.0–46.0)
Hemoglobin: 10.6 g/dL — ABNORMAL LOW (ref 12.0–15.0)
MCH: 29.4 pg (ref 26.0–34.0)
MCHC: 32.6 g/dL (ref 30.0–36.0)
MCV: 90.3 fL (ref 78.0–100.0)
Platelets: 378 10*3/uL (ref 150–400)
RBC: 3.6 MIL/uL — ABNORMAL LOW (ref 3.87–5.11)
RDW: 14.9 % (ref 11.5–15.5)
WBC: 10.6 10*3/uL — ABNORMAL HIGH (ref 4.0–10.5)

## 2011-02-04 MED ORDER — WARFARIN SODIUM 1 MG PO TABS: ORAL_TABLET | ORAL | Status: DC

## 2011-02-04 MED ORDER — PREGABALIN 50 MG PO CAPS: 50.0000 mg | ORAL_CAPSULE | Freq: Two times a day (BID) | ORAL | Status: DC

## 2011-02-04 MED ORDER — HYDROMORPHONE HCL 2 MG PO TABS: 2.0000 mg | ORAL_TABLET | ORAL | Status: DC | PRN

## 2011-02-04 NOTE — Progress Notes (Signed)
Physical Therapy Treatment Patient Details Name: GENENE WYLES MRN: [id number] DOB: 04/30/1970 Today's Date: 02/04/2011  PT Assessment/Plan  PT - Assessment/Plan Comments on Treatment Session: pt anticipates D/C to home today.  pt notes she has been doing the LE ther ex Independently.  pt needs max encouragement to mobilize L LE, but self-limits due to pain.   PT Plan: Discharge plan remains appropriate;Frequency remains appropriate PT Frequency: Min 3X/week Recommendations for Other Services: OT consult Follow Up Recommendations: Home health PT;Supervision/Assistance - 24 hour Equipment Recommended: Rolling walker with 5" wheels PT Goals  Acute Rehab PT Goals PT Goal: Supine/Side to Sit - Progress: Partly met PT Goal: Sit to Supine/Side - Progress: Progressing toward goal PT Goal: Sit to Stand - Progress: Progressing toward goal PT Goal: Stand to Sit - Progress: Progressing toward goal PT Goal: Ambulate - Progress: Progressing toward goal PT Goal: Perform Home Exercise Program - Progress: Partly met Additional Goals PT Goal: Additional Goal #1 - Progress: Progressing toward goal  PT Treatment Precautions/Restrictions  Precautions Precautions: Fall Precaution Comments: no Required Braces or Orthoses: No Restrictions Weight Bearing Restrictions: No Other Position/Activity Restrictions: no Mobility (including Balance) Bed Mobility Bed Mobility: Yes Supine to Sit: 6: Modified independent (Device/Increase time);With rails Sitting - Scoot to Edge of Bed: 7: Independent Transfers Transfers: Yes Sit to Stand: 5: Supervision;With upper extremity assist;From bed Sit to Stand Details (indicate cue type and reason): pt holds L LE in [place] during sit-stand Stand to Sit: 5: Supervision;To chair/3-in-1;With armrests Stand to Sit Details: cues to control descent Ambulation/Gait Ambulation/Gait: Yes Ambulation/Gait Assistance: 4: Min assist Ambulation/Gait Assistance Details (indicate  cue type and reason): pt needs max cueing to encourage WBing on L LE secondary to pain.  With increased ambulation pt able to place foot flat on floor and increased to WBAT.  pt initially doesn't even have a step-to gait, but with progressive ambulation and cueing able to step R LE past L LE.   Ambulation Distance (Feet): 140 Feet Assistive device: Rolling walker Gait Pattern: Decreased stride length Stairs: No Wheelchair Mobility Wheelchair Mobility: No  Posture/Postural Control Posture/Postural Control: No significant limitations Exercise    End of Session PT - End of Session Equipment Utilized During Treatment: Gait belt Activity Tolerance: Patient tolerated treatment well Patient left: in chair;with call bell in reach Nurse Communication: Mobility status for transfers;Mobility status for ambulation General Behavior During Session: [facility] for tasks performed Cognition: [facility] for tasks performed  Catarina Hartshorn, [place] 02/04/2011, 1:08 PM

## 2011-02-04 NOTE — Progress Notes (Signed)
Pt given discharge instructions, follow up appts, and prescriptions, she stated understanding.

## 2011-02-04 NOTE — Discharge Planning (Signed)
[facility] Discharge Summary   Patient ID:  [name] MRN: [id number] DOB/AGE: 1970-03-09 41 y.o.  Admit date: 01/27/2011 Discharge date: 02/04/2011 Date of Surgery: 01/27/2011 - 01/29/2011 Surgeon: Juliann Mule): Lisa Misty, MD  Admission Diagnosis: Vascular insufficiency [459.9] Lower extremity pain [729.5] LEG PAIN claudication Ischemic Left LE Left leg thrombus  Discharge Diagnoses:  Vascular insufficiency [459.9] Lower extremity pain [729.5] LEG PAIN claudication Ischemic Left LE Left leg thrombus  Secondary Diagnoses: Past Medical History  Diagnosis Date  . Diabetes mellitus     Since Age 3  . Shingles     11/2010  . Renal disorder     glomerulonephritis  . Cellulitis     of face - 12/2010  . Peripheral vascular disease in diabetes mellitus   . Hyperlipidemia   . Tobacco abuse     0.5 - 1ppd x 21 yrs    Procedures: Procedure(s): EMBOLECTOMY  Discharged Condition: good  HPI:  Ischemic left leg.   Patient continues to have a mottled left foot with an audible flow in dorsalis pedis and posterior tibial artery. She had thrombolyzes performed over past 48 hours as well as heparin infusion and angiography today reveals no thrombus remaining in the infrarenal aorta or iliac systems bilaterally. She does have some thrombus in left superficial femoral artery proximally and in all 3 tibial vessels on the left . Patient had 2-[name] echocardiogram with normal ejection fraction 65-70% and no thrombus noted in the left heart.  Labs:      Hospital Course:  [name] is a 41 y.o. female is S/P Left Tibial embolectomy. Procedure(s): EMBOLECTOMY Extubated: POD # 5 Post-op wounds clean, dry, intact or healing well Pt. Ambulating, voiding and taking PO diet without difficulty. Pt pain controlled with PO pain meds. Labs as below Complications:none  Consults:  Treatment Team:  Lisa Misty, MD  Significant Diagnostic Studies: CBC      Component Value Date/Time   WBC 10.6* 02/04/2011 0535   RBC 3.60* 02/04/2011 0535   HGB 10.6* 02/04/2011 0535   HCT 32.5* 02/04/2011 0535   PLT 378 02/04/2011 0535   MCV 90.3 02/04/2011 0535   MCH 29.4 02/04/2011 0535   MCHC 32.6 02/04/2011 0535   RDW 14.9 02/04/2011 0535   LYMPHSABS 3.9 01/27/2011 1033   MONOABS 0.9 01/27/2011 1033   EOSABS 0.1 01/27/2011 1033   BASOSABS 0.0 01/27/2011 1033    BMET    Component Value Date/Time   NA 140 02/01/2011 0515   K 3.5 02/01/2011 0515   CL 110 02/01/2011 0515   CO2 22 02/01/2011 0515   GLUCOSE 72 02/01/2011 0515   BUN 8 02/01/2011 0515   CREATININE 1.04 02/01/2011 0515   CALCIUM 7.5* 02/01/2011 0515   GFRNONAA 66* 02/01/2011 0515   GFRAA 76* 02/01/2011 0515    COAG Lab Results  Component Value Date   INR 2.90* 02/04/2011   INR 2.21* 02/03/2011   INR 3.66* 02/02/2011     Disposition:  Discharge to :Home Discharge Orders    Future Orders Please Complete By Expires   Walker rolling      Resume previous diet      Call MD for:  temperature >100.5      Call MD for:  redness, tenderness, or signs of infection (pain, swelling, bleeding, redness, odor or green/yellow discharge around incision site)      Call MD for:  severe or increased pain, loss or decreased feeling  in affected limb(s)  may wash over wound with mild soap and water      No dressing needed         [name]  Home Medication Instructions [id number]   Printed on:02/04/11 [id number]  Medication Information                    ALPRAZolam (XANAX) 1 MG tablet Take 1 mg by mouth daily as needed. Anxiety           sertraline (ZOLOFT) 50 MG tablet Take 50 mg by mouth daily.             potassium chloride SA (K-DUR,KLOR-CON) 20 MEQ tablet Take 0.5 tablets (10 mEq total) by mouth daily.           furosemide (LASIX) 20 MG tablet Take 20 mg by mouth 3 (three) times a week. On Mondays, Wednesdays, and Fridays            losartan (COZAAR) 25 MG tablet Take 12.5 mg by mouth daily.              fish oil-omega-3 fatty acids 1000 MG capsule Take 1 g by mouth daily.            acetaminophen (TYLENOL) 500 MG tablet Take 1,000 mg by mouth every 6 (six) hours as needed. For pain            diphenhydrAMINE (BENADRYL) 25 MG tablet Take 25 mg by mouth at bedtime as needed. For sleep           Multiple Vitamins-Minerals (MULTIVITAMINS THER. W/MINERALS) TABS Take 1 tablet by mouth daily.             insulin aspart (NOVOLOG) 100 UNIT/ML injection Inject 10 Units into the skin 3 (three) times daily before meals.           insulin glargine (LANTUS) 100 UNIT/ML injection Inject 30 Units into the skin at bedtime.           HYDROmorphone (DILAUDID) 2 MG tablet Take 1-2 tablets (2-4 mg total) by mouth every 4 (four) hours as needed.           pregabalin (LYRICA) 50 MG capsule Take 1 capsule (50 mg total) by mouth 2 (two) times daily.           warfarin (COUMADIN) 1 MG tablet 3mg  po QD at 6pm.            Verbal and written Discharge instructions given to the patient. Wound care per Discharge AVS Follow-up Information    Follow up with Lisa Lack D, MD in 4 weeks.   Contact information:   [place] [phone]          Signed: Roxy Horseman 02/04/2011, 8:23 AM    Agree with above. Will see in 2 weeks.

## 2011-02-04 NOTE — Progress Notes (Signed)
Case Manager spoke with patient regarding Home Health needs. Choice was offered, has been setup with [facility]. Rolling walker ordered, entered in [facility].

## 2011-02-08 ENCOUNTER — Telehealth: Payer: Self-pay

## 2011-02-08 ENCOUNTER — Encounter: Payer: Self-pay | Admitting: Vascular Surgery

## 2011-02-08 DIAGNOSIS — G8918 Other acute postprocedural pain: Secondary | ICD-10-CM

## 2011-02-08 DIAGNOSIS — I998 Other disorder of circulatory system: Secondary | ICD-10-CM

## 2011-02-08 DIAGNOSIS — T8140XA Infection following a procedure, unspecified, initial encounter: Secondary | ICD-10-CM

## 2011-02-08 MED ORDER — HYDROCODONE-ACETAMINOPHEN 5-500 MG PO TABS: ORAL_TABLET | ORAL | Status: DC

## 2011-02-08 MED ORDER — CEPHALEXIN 500 MG PO CAPS: 500.0000 mg | ORAL_CAPSULE | Freq: Four times a day (QID) | ORAL | Status: DC

## 2011-02-08 NOTE — Telephone Encounter (Signed)
Home Health Nurse called to report pt. Having increased swelling and redness of left foot/lower leg.  States redness extends from top of foot to approx. 7 cm. above ankle.  Also reports fever of 101 degrees this am, and a blue discoloration to top 1/4 of 4th and 5th toes.  Also reports pt. Has increased pain in foot.  Discussed w/ Dr. Kellie Simmering.  Rec'd verbal order to obtain ABI's of left leg, and office appt. today.  Pt. Advised of need to come to office.  States will try to arrange transportation.

## 2011-02-08 NOTE — Telephone Encounter (Signed)
Pt. Called office after being given appt. For today at 1:00pm.  Stated she doesn't have any transportation to get to office today.  Is requesting pain medication for increasing pain in left ft and leg.  Discussed w/ Dr. Kellie Simmering.  Rec'd verbal order to start Keflex 500 mg qid x 14 days and may order Hydrocodone 5/500 per standing order for pain; also per Dr. Kellie Simmering, schedule pt. to have ABI's and MD appt. tomorrow at 8:30 am.   Pt. Advised of the above. Verb. Understanding.

## 2011-02-09 ENCOUNTER — Encounter (INDEPENDENT_AMBULATORY_CARE_PROVIDER_SITE_OTHER): Payer: Medicaid Other | Admitting: *Deleted

## 2011-02-09 ENCOUNTER — Encounter: Payer: Self-pay | Admitting: Vascular Surgery

## 2011-02-09 ENCOUNTER — Ambulatory Visit (INDEPENDENT_AMBULATORY_CARE_PROVIDER_SITE_OTHER): Payer: Medicaid Other | Admitting: Vascular Surgery

## 2011-02-09 VITALS — BP 110/72 | HR 87 | Resp 18 | Ht 61.0 in | Wt 125.0 lb

## 2011-02-09 DIAGNOSIS — I743 Embolism and thrombosis of arteries of the lower extremities: Secondary | ICD-10-CM | POA: Insufficient documentation

## 2011-02-09 DIAGNOSIS — I739 Peripheral vascular disease, unspecified: Secondary | ICD-10-CM

## 2011-02-09 DIAGNOSIS — M79609 Pain in unspecified limb: Secondary | ICD-10-CM

## 2011-02-09 NOTE — Progress Notes (Signed)
Subjective:     Patient ID: [name], female   DOB: July 21, 1970, 41 y.o.   MRN: [id number]  HPI this 41 year old female returns today for continued followup regarding her ischemic left foot she presented to the hospital on January 9 with thrombus in her infrarenal aorta having embolized to her left distal tibial vessels following 48 hours and following thrombosed lysis she had exploration of her left popliteal artery with thrombectomy of all tibial vessels and patch angioplasty. A thrombus in her aorta was no longer present on the angiogram having been lysed. Today she returns with continued pain in the left foot. She has a foot drop which has been present since she presented. She had some temperature elevation yesterday to 101 but has been afebrile today and has been started on Keflex 500 mg 4 times a day. She is on Coumadin therapy with an INR greater than 2.0  Review of Systems     Objective:   Physical ExamBP 110/72  Pulse 87  Resp 18  Ht 5\' 1"  (1.549 m)  Wt 125 lb (56.7 kg)  BMI 23.62 kg/m2   general well-developed well-nourished female in no apparent distress Left leg or the ulcer 3+ femoral and 2+ popliteal pulse palpable. She has excellent Doppler flow in the posterior tibial artery and the left ankle and fairly good Doppler flow in the dorsalis pedis in the left foot. She has very little dorsiflexion of the left foot with 1-2+ edema. He has edema up to the knee a 1+. There is gangrene in the distal half of the left fourth and fifth toes with some mild erythema proximally which she states is better than the past few days. Assessment:     Today I ordered lower extremity ABIs which are greater than 1.0 in the left lower cavity and the PT and DP.   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 I discussed this with Dr. due to today.

## 2011-02-11 ENCOUNTER — Encounter (HOSPITAL_COMMUNITY): Payer: Self-pay

## 2011-02-11 ENCOUNTER — Inpatient Hospital Stay (HOSPITAL_COMMUNITY)
Admission: EM | Admit: 2011-02-11 | Discharge: 2011-02-14 | DRG: 603 | Disposition: A | Discharge status: Home / Self Care | Payer: Medicaid Other | Attending: Internal Medicine | Admitting: Internal Medicine

## 2011-02-11 DIAGNOSIS — T8140XA Infection following a procedure, unspecified, initial encounter: Secondary | ICD-10-CM

## 2011-02-11 DIAGNOSIS — F17200 Nicotine dependence, unspecified, uncomplicated: Secondary | ICD-10-CM | POA: Diagnosis present

## 2011-02-11 DIAGNOSIS — F329 Major depressive disorder, single episode, unspecified: Secondary | ICD-10-CM | POA: Diagnosis present

## 2011-02-11 DIAGNOSIS — L03211 Cellulitis of face: Secondary | ICD-10-CM | POA: Diagnosis present

## 2011-02-11 DIAGNOSIS — L03119 Cellulitis of unspecified part of limb: Principal | ICD-10-CM | POA: Diagnosis present

## 2011-02-11 DIAGNOSIS — I798 Other disorders of arteries, arterioles and capillaries in diseases classified elsewhere: Secondary | ICD-10-CM | POA: Diagnosis present

## 2011-02-11 DIAGNOSIS — Z720 Tobacco use: Secondary | ICD-10-CM

## 2011-02-11 DIAGNOSIS — I739 Peripheral vascular disease, unspecified: Secondary | ICD-10-CM | POA: Diagnosis present

## 2011-02-11 DIAGNOSIS — E785 Hyperlipidemia, unspecified: Secondary | ICD-10-CM | POA: Diagnosis present

## 2011-02-11 DIAGNOSIS — Z79899 Other long term (current) drug therapy: Secondary | ICD-10-CM

## 2011-02-11 DIAGNOSIS — Z7901 Long term (current) use of anticoagulants: Secondary | ICD-10-CM

## 2011-02-11 DIAGNOSIS — I743 Embolism and thrombosis of arteries of the lower extremities: Secondary | ICD-10-CM | POA: Diagnosis present

## 2011-02-11 DIAGNOSIS — IMO0001 Reserved for inherently not codable concepts without codable children: Secondary | ICD-10-CM | POA: Diagnosis present

## 2011-02-11 DIAGNOSIS — F411 Generalized anxiety disorder: Secondary | ICD-10-CM | POA: Diagnosis present

## 2011-02-11 DIAGNOSIS — E1065 Type 1 diabetes mellitus with hyperglycemia: Secondary | ICD-10-CM | POA: Diagnosis present

## 2011-02-11 DIAGNOSIS — L03116 Cellulitis of left lower limb: Secondary | ICD-10-CM | POA: Diagnosis present

## 2011-02-11 DIAGNOSIS — E1049 Type 1 diabetes mellitus with other diabetic neurological complication: Secondary | ICD-10-CM | POA: Diagnosis present

## 2011-02-11 DIAGNOSIS — L02619 Cutaneous abscess of unspecified foot: Principal | ICD-10-CM | POA: Diagnosis present

## 2011-02-11 DIAGNOSIS — E1059 Type 1 diabetes mellitus with other circulatory complications: Secondary | ICD-10-CM | POA: Diagnosis present

## 2011-02-11 DIAGNOSIS — Z794 Long term (current) use of insulin: Secondary | ICD-10-CM

## 2011-02-11 DIAGNOSIS — Z6825 Body mass index (BMI) 25.0-25.9, adult: Secondary | ICD-10-CM

## 2011-02-11 DIAGNOSIS — I96 Gangrene, not elsewhere classified: Secondary | ICD-10-CM | POA: Diagnosis present

## 2011-02-11 DIAGNOSIS — F3289 Other specified depressive episodes: Secondary | ICD-10-CM | POA: Diagnosis present

## 2011-02-11 DIAGNOSIS — L0201 Cutaneous abscess of face: Secondary | ICD-10-CM | POA: Diagnosis present

## 2011-02-11 DIAGNOSIS — E1142 Type 2 diabetes mellitus with diabetic polyneuropathy: Secondary | ICD-10-CM | POA: Diagnosis present

## 2011-02-11 DIAGNOSIS — R9431 Abnormal electrocardiogram [ECG] [EKG]: Secondary | ICD-10-CM

## 2011-02-11 HISTORY — DX: Headache: R51

## 2011-02-11 LAB — URINE MICROSCOPIC-ADD ON

## 2011-02-11 LAB — CBC
HCT: 33.5 % — ABNORMAL LOW (ref 36.0–46.0)
Hemoglobin: 11.1 g/dL — ABNORMAL LOW (ref 12.0–15.0)
MCH: 30 pg (ref 26.0–34.0)
MCHC: 33.1 g/dL (ref 30.0–36.0)
MCV: 90.5 fL (ref 78.0–100.0)
Platelets: 688 10*3/uL — ABNORMAL HIGH (ref 150–400)
RBC: 3.7 MIL/uL — ABNORMAL LOW (ref 3.87–5.11)
RDW: 14.4 % (ref 11.5–15.5)
WBC: 15.2 10*3/uL — ABNORMAL HIGH (ref 4.0–10.5)

## 2011-02-11 LAB — COMPREHENSIVE METABOLIC PANEL
ALT: 16 U/L (ref 0–35)
AST: 15 U/L (ref 0–37)
Albumin: 0.6 g/dL — ABNORMAL LOW (ref 3.5–5.2)
Alkaline Phosphatase: 113 U/L (ref 39–117)
BUN: 14 mg/dL (ref 6–23)
CO2: 26 mEq/L (ref 19–32)
Calcium: 7.5 mg/dL — ABNORMAL LOW (ref 8.4–10.5)
Chloride: 103 mEq/L (ref 96–112)
Creatinine, Ser: 0.89 mg/dL (ref 0.50–1.10)
GFR calc Af Amer: >90 mL/min (ref >90)
GFR calc non Af Amer: 79 mL/min — ABNORMAL LOW (ref >90)
Glucose, Bld: 130 mg/dL — ABNORMAL HIGH (ref 70–99)
Potassium: 3.3 mEq/L — ABNORMAL LOW (ref 3.5–5.1)
Sodium: 137 mEq/L (ref 135–145)
Total Bilirubin: <0.1 mg/dL — ABNORMAL LOW (ref 0.3–1.2)
Total Protein: 5 g/dL — ABNORMAL LOW (ref 6.0–8.3)

## 2011-02-11 LAB — URINALYSIS, ROUTINE W REFLEX MICROSCOPIC
Bilirubin Urine: NEGATIVE
Glucose, UA: 250 mg/dL — AB
Ketones, ur: NEGATIVE mg/dL
Leukocytes, UA: NEGATIVE
Nitrite: NEGATIVE
Protein, ur: >300 mg/dL — AB
Specific Gravity, Urine: 1.016 (ref 1.005–1.030)
Urobilinogen, UA: 0.2 mg/dL (ref 0.0–1.0)
pH: 7.5 (ref 5.0–8.0)

## 2011-02-11 LAB — GLUCOSE, CAPILLARY
Glucose-Capillary: 75 mg/dL (ref 70–99)
Glucose-Capillary: 72 mg/dL (ref 70–99)
Glucose-Capillary: 156 mg/dL — ABNORMAL HIGH (ref 70–99)
Glucose-Capillary: 62 mg/dL — ABNORMAL LOW (ref 70–99)
Glucose-Capillary: 62 mg/dL — ABNORMAL LOW (ref 70–99)
Glucose-Capillary: 84 mg/dL (ref 70–99)

## 2011-02-11 LAB — DIFFERENTIAL
Basophils Absolute: 0.1 10*3/uL (ref 0.0–0.1)
Basophils Relative: 0 % (ref 0–1)
Eosinophils Absolute: 0.7 10*3/uL (ref 0.0–0.7)
Eosinophils Relative: 4 % (ref 0–5)
Lymphocytes Relative: 24 % (ref 12–46)
Lymphs Abs: 3.6 10*3/uL (ref 0.7–4.0)
Monocytes Absolute: 0.9 10*3/uL (ref 0.1–1.0)
Monocytes Relative: 6 % (ref 3–12)
Neutro Abs: 9.9 10*3/uL — ABNORMAL HIGH (ref 1.7–7.7)
Neutrophils Relative %: 65 % (ref 43–77)

## 2011-02-11 LAB — HEMOGLOBIN A1C
Hgb A1c MFr Bld: 8 % — ABNORMAL HIGH (ref <5.7)
Mean Plasma Glucose: 183 mg/dL — ABNORMAL HIGH (ref <117)

## 2011-02-11 LAB — PROTIME-INR
INR: 2.1 — ABNORMAL HIGH (ref 0.00–1.49)
Prothrombin Time: 23.9 seconds — ABNORMAL HIGH (ref 11.6–15.2)

## 2011-02-11 MED ORDER — VANCOMYCIN HCL IN DEXTROSE 1-5 GM/200ML-% IV SOLN
1000.0000 mg | Freq: Once | INTRAVENOUS | Status: AC
  Administered 2011-02-11: 1000 mg via INTRAVENOUS
  Filled 2011-02-11: qty 200

## 2011-02-11 MED ORDER — ONDANSETRON HCL 4 MG/2ML IJ SOLN
4.0000 mg | Freq: Once | INTRAMUSCULAR | Status: AC
  Administered 2011-02-11: 4 mg via INTRAVENOUS
  Filled 2011-02-11: qty 2

## 2011-02-11 MED ORDER — WARFARIN SODIUM 3 MG PO TABS
3.0000 mg | ORAL_TABLET | Freq: Every day | ORAL | Status: DC
  Administered 2011-02-11 – 2011-02-12 (×2): 3 mg via ORAL
  Filled 2011-02-11 (×3): qty 1

## 2011-02-11 MED ORDER — ONDANSETRON HCL 4 MG/2ML IJ SOLN
INTRAMUSCULAR | Status: AC
  Administered 2011-02-11: 4 mg via INTRAVENOUS
  Filled 2011-02-11: qty 2

## 2011-02-11 MED ORDER — POLYETHYLENE GLYCOL 3350 17 G PO PACK
17.0000 g | PACK | Freq: Every day | ORAL | Status: DC
  Administered 2011-02-13 – 2011-02-14 (×2): 17 g via ORAL
  Filled 2011-02-11 (×4): qty 1

## 2011-02-11 MED ORDER — THERA M PLUS PO TABS
1.0000 | ORAL_TABLET | Freq: Every day | ORAL | Status: DC
  Administered 2011-02-11 – 2011-02-14 (×4): 1 via ORAL
  Filled 2011-02-11 (×4): qty 1

## 2011-02-11 MED ORDER — ALPRAZOLAM 0.5 MG PO TABS
1.0000 mg | ORAL_TABLET | Freq: Every day | ORAL | Status: DC | PRN
  Administered 2011-02-12 – 2011-02-13 (×2): 1 mg via ORAL
  Filled 2011-02-11: qty 2
  Filled 2011-02-11 (×2): qty 1

## 2011-02-11 MED ORDER — PREGABALIN 50 MG PO CAPS
50.0000 mg | ORAL_CAPSULE | Freq: Two times a day (BID) | ORAL | Status: DC
  Administered 2011-02-11 – 2011-02-14 (×7): 50 mg via ORAL
  Filled 2011-02-11 (×8): qty 1

## 2011-02-11 MED ORDER — OXYCODONE HCL 5 MG PO TABS
5.0000 mg | ORAL_TABLET | ORAL | Status: DC | PRN
  Administered 2011-02-12 – 2011-02-14 (×5): 5 mg via ORAL
  Filled 2011-02-11 (×5): qty 1

## 2011-02-11 MED ORDER — INSULIN ASPART 100 UNIT/ML ~~LOC~~ SOLN
0.0000 [IU] | Freq: Three times a day (TID) | SUBCUTANEOUS | Status: DC
  Administered 2011-02-11: 3 [IU] via SUBCUTANEOUS
  Administered 2011-02-12 – 2011-02-13 (×3): 2 [IU] via SUBCUTANEOUS
  Filled 2011-02-11: qty 3

## 2011-02-11 MED ORDER — ONDANSETRON HCL 4 MG PO TABS: 4.0000 mg | ORAL_TABLET | Freq: Four times a day (QID) | ORAL | Status: DC | PRN

## 2011-02-11 MED ORDER — HYDROMORPHONE HCL PF 2 MG/ML IJ SOLN
2.0000 mg | Freq: Once | INTRAMUSCULAR | Status: AC
  Administered 2011-02-11: 2 mg via INTRAVENOUS
  Filled 2011-02-11: qty 1

## 2011-02-11 MED ORDER — ACETAMINOPHEN 650 MG RE SUPP: 650.0000 mg | Freq: Four times a day (QID) | RECTAL | Status: DC | PRN

## 2011-02-11 MED ORDER — SODIUM CHLORIDE 0.9 % IV SOLN
INTRAVENOUS | Status: DC
  Administered 2011-02-11 – 2011-02-12 (×2): via INTRAVENOUS

## 2011-02-11 MED ORDER — ONDANSETRON HCL 4 MG/2ML IJ SOLN: 4.0000 mg | Freq: Four times a day (QID) | INTRAMUSCULAR | Status: DC | PRN

## 2011-02-11 MED ORDER — SODIUM CHLORIDE 0.9 % IV BOLUS (SEPSIS)
500.0000 mL | Freq: Once | INTRAVENOUS | Status: AC
  Administered 2011-02-11: 1000 mL via INTRAVENOUS

## 2011-02-11 MED ORDER — INSULIN GLARGINE 100 UNIT/ML ~~LOC~~ SOLN
30.0000 [IU] | Freq: Every day | SUBCUTANEOUS | Status: DC
  Filled 2011-02-11: qty 3

## 2011-02-11 MED ORDER — HYDROMORPHONE HCL PF 1 MG/ML IJ SOLN
1.0000 mg | Freq: Once | INTRAMUSCULAR | Status: AC
  Administered 2011-02-11: 1 mg via INTRAVENOUS
  Filled 2011-02-11: qty 1

## 2011-02-11 MED ORDER — DIPHENHYDRAMINE HCL 25 MG PO TABS
25.0000 mg | ORAL_TABLET | Freq: Four times a day (QID) | ORAL | Status: DC | PRN
  Administered 2011-02-12 (×2): 25 mg via ORAL
  Filled 2011-02-11 (×3): qty 1

## 2011-02-11 MED ORDER — LOSARTAN POTASSIUM 25 MG PO TABS
12.5000 mg | ORAL_TABLET | Freq: Every day | ORAL | Status: DC
  Administered 2011-02-11 – 2011-02-14 (×4): 12.5 mg via ORAL
  Filled 2011-02-11 (×7): qty 0.5

## 2011-02-11 MED ORDER — ACETAMINOPHEN 325 MG PO TABS: 650.0000 mg | ORAL_TABLET | Freq: Four times a day (QID) | ORAL | Status: DC | PRN

## 2011-02-11 MED ORDER — HYDROMORPHONE HCL PF 1 MG/ML IJ SOLN
2.0000 mg | INTRAMUSCULAR | Status: DC | PRN
  Administered 2011-02-11 – 2011-02-14 (×21): 2 mg via INTRAVENOUS
  Filled 2011-02-11 (×20): qty 2

## 2011-02-11 MED ORDER — ONDANSETRON HCL 4 MG/2ML IJ SOLN
4.0000 mg | Freq: Once | INTRAMUSCULAR | Status: AC
  Administered 2011-02-11: 4 mg via INTRAVENOUS

## 2011-02-11 MED ORDER — SERTRALINE HCL 50 MG PO TABS
50.0000 mg | ORAL_TABLET | Freq: Every day | ORAL | Status: DC
  Administered 2011-02-11 – 2011-02-14 (×4): 50 mg via ORAL
  Filled 2011-02-11 (×4): qty 1

## 2011-02-11 MED ORDER — DIPHENHYDRAMINE HCL 25 MG PO TABS
25.0000 mg | ORAL_TABLET | Freq: Every evening | ORAL | Status: DC | PRN
  Administered 2011-02-11: 25 mg via ORAL
  Filled 2011-02-11 (×2): qty 1

## 2011-02-11 NOTE — ED Provider Notes (Addendum)
History     CSN: [id number]  Arrival date & time 02/11/11  0450   First MD Initiated Contact with Patient 02/11/11 [phone]      Chief Complaint  Patient presents with  . Leg Pain    (Consider location/radiation/quality/duration/timing/severity/associated sxs/prior treatment) HPI....level V caveat for urgent need for intervention. complains of left foot redness and painfor several days.  Earlier this month, patient had a thrombus in her abdominal aorta with embolization of the left anterior tibial artery distally into the foot. She was seen at [facility] by Dr. Kellie Simmering with embolectomy and heparinization and a vascular surgery procedure.  Dr. Meridee Score was consulted to assess her necrotic toes of the left foot #4 and 5.  She presents to ED today with persistent redness and pain in the left foot. Palpation makes it worse.  Past Medical History  Diagnosis Date  . Diabetes mellitus     Since Age 24  . Shingles     11/2010  . Renal disorder     glomerulonephritis  . Cellulitis     of face - 12/2010  . Peripheral vascular disease in diabetes mellitus   . Hyperlipidemia   . Tobacco abuse     0.5 - 1ppd x 21 yrs    Past Surgical History  Procedure Date  . Wrist surgery     left  . Tubal ligation   . Embolectomy 01/29/2011    Procedure: EMBOLECTOMY;  Surgeon: Mal Misty, MD;  Location: [facility] OR;  Service: Vascular;  Laterality: Left;  Left Popliteal and Tibial Embolectomy with patch angioplasty    Family History  Problem Relation Age of Onset  . COPD Mother     alive - 42  . Hypertension Mother   . Diabetes Father     alive - 42  . Hypertension Father   . Diabetes Mother   . Stroke Mother   . Coronary artery disease Mother   . Coronary artery disease Father     History  Substance Use Topics  . Smoking status: Current Everyday Smoker -- 1.0 packs/day for 21 years    Types: Cigarettes  . Smokeless tobacco: Never Used  . Alcohol Use: No    OB History    Grav  Para Term Preterm Abortions TAB SAB Ect Mult Living   1 1  1      1       Review of Systems  All other systems reviewed and are negative.    Allergies  Aspirin and Ibuprofen  Home Medications   Current Outpatient Rx  Name Route Sig Dispense Refill  . ACETAMINOPHEN 500 MG PO TABS Oral Take 1,000 mg by mouth every 6 (six) hours as needed. For pain     . ALPRAZOLAM 1 MG PO TABS Oral Take 1 mg by mouth daily as needed. Anxiety    . DIPHENHYDRAMINE HCL 25 MG PO TABS Oral Take 25 mg by mouth at bedtime as needed. For sleep    . OMEGA-3 FATTY ACIDS 1000 MG PO CAPS Oral Take 1 g by mouth daily.     . FUROSEMIDE 20 MG PO TABS Oral Take 20 mg by mouth 3 (three) times a week. On Mondays, Wednesdays, and Fridays     . HYDROCODONE-ACETAMINOPHEN 5-500 MG PO TABS  Take 1-2 tablets every 6 hrs/prn for pain 20 tablet 0  . INSULIN ASPART 100 UNIT/ML [place] SOLN Subcutaneous Inject 10 Units into the skin 3 (three) times daily before meals. 1 vial 12  .  INSULIN GLARGINE 100 UNIT/ML [place] SOLN Subcutaneous Inject 30 Units into the skin at bedtime. 10 mL 12  . LOSARTAN POTASSIUM 25 MG PO TABS Oral Take 12.5 mg by mouth daily.      Creed Copper M PLUS PO TABS Oral Take 1 tablet by mouth daily.      Marland Kitchen POTASSIUM CHLORIDE CRYS ER 20 MEQ PO TBCR Oral Take 0.5 tablets (10 mEq total) by mouth daily. 7 tablet 0  . PREGABALIN 50 MG PO CAPS Oral Take 1 capsule (50 mg total) by mouth 2 (two) times daily. 60 capsule 1  . SERTRALINE HCL 50 MG PO TABS Oral Take 50 mg by mouth daily.      . WARFARIN SODIUM 1 MG PO TABS  3mg  po QD at 6pm. 150 tablet 1  . CEPHALEXIN 500 MG PO CAPS Oral Take 1 capsule (500 mg total) by mouth 4 (four) times daily. 56 capsule 0  . HYDROMORPHONE HCL 2 MG PO TABS Oral Take 1-2 tablets (2-4 mg total) by mouth every 4 (four) hours as needed. 30 tablet 0    BP 98/49  Pulse 84  Temp(Src) 98.6 F (37 C) (Oral)  Resp 18  SpO2 99%  Physical Exam  Nursing note and vitals reviewed. Constitutional: She is  oriented to person, place, and time. She appears well-developed and well-nourished.  HENT:  Head: Normocephalic and atraumatic.  Eyes: Conjunctivae and EOM are normal. Pupils are equal, round, and reactive to light.  Neck: Normal range of motion. Neck supple.  Cardiovascular: Normal rate and regular rhythm.   Pulmonary/Chest: Effort normal and breath sounds normal.  Abdominal: Soft. Bowel sounds are normal.  Musculoskeletal:       Left foot erythematous and tender and puffy. Fourth and fifth toes are becoming necrotic  Neurological: She is alert and oriented to person, place, and time.  Skin: Skin is warm and dry.  Psychiatric: She has a normal mood and affect.    ED Course  Procedures (including critical care time)  Labs Reviewed  CBC - Abnormal; Notable for the following:    WBC 15.2 (*)    RBC 3.70 (*)    Hemoglobin 11.1 (*)    HCT 33.5 (*)    Platelets 688 (*)    All other components within normal limits  DIFFERENTIAL - Abnormal; Notable for the following:    Neutro Abs 9.9 (*)    All other components within normal limits  COMPREHENSIVE METABOLIC PANEL - Abnormal; Notable for the following:    Potassium 3.3 (*)    Glucose, Bld 130 (*)    Calcium 7.5 (*)    Total Protein 5.0 (*)    Albumin 0.6 (*)    Total Bilirubin <0.1 (*)    GFR calc non Af Amer 79 (*)    All other components within normal limits   No results found.   No diagnosis found.  .CRITICAL CARE Performed by: Nat Christen   Total critical care time: 30  Critical care time was exclusive of separately billable procedures and treating other patients.  Critical care was necessary to treat or prevent imminent or life-threatening deterioration.  Critical care was time spent personally by me on the following activities: development of treatment plan with patient and/or surrogate as well as nursing, discussions with consultants, evaluation of patient's response to treatment, examination of patient, obtaining  history from patient or surrogate, ordering and performing treatments and interventions, ordering and review of laboratory studies, ordering and review of radiographic studies, pulse  oximetry and re-evaluation of patient's condition.  MDM  Patient has cellulitis in the left foot. Additionally the fourth and fifth toes are becoming necrotic. I've discussed the case with Dr. Kellie Simmering vascular surgeon, Dr. Luan Pulling local internist, and hospitalist. I amAttempting to admit patient to [facility] the hospitalist program   07 30: Discussed with Dr. Liston Alba at [facility]. Will transferred to [facility] under team 2.  Dr. Sharol Given or his colleague will need to consult for the patient's necrotic toes on the left foot.     Nat Christen, MD 02/11/11 [place], MD 02/11/11 [phone]

## 2011-02-11 NOTE — ED Notes (Signed)
Per Dr. Lacinda Axon who spoke with accepting MD, Dr. Zigmund Daniel - Dr. Zigmund Daniel requests Ortho consult for Dr. Jess Barters group to see pt at [facility].  RN will inform receiving RN at [facility].

## 2011-02-11 NOTE — ED Notes (Signed)
Breakfast tray given per pt request.

## 2011-02-11 NOTE — ED Notes (Addendum)
Pt has multiple abrasions on bilateral arms and legs; pt states from falling; pt uses a walker at baseline; pt states that she has fallen 3 times in the last week from where she is not used of using a walker.  Pt had surgery last week at [facility] by Dr. Kellie Simmering to remove blood clots from aorta and foot. Pt's left 4th and 5th toes black; pt to see surgeon next week to schedule surgery for amputation. Pulses present on bilateral feet.Left weaker than Right. Left foot red and warm to the touch. Right foot swollen and tender to the touch.

## 2011-02-11 NOTE — ED Notes (Signed)
Pt c/o pain unrelieved; pt has been taking dilaudid 4 mg po q 4 hours around the clock for the last week and a half per pt statement; notified MD and discussed; Order received and entered. Will carry out order.

## 2011-02-11 NOTE — ED Notes (Signed)
Pt tearful upon entering room, c/o pain.  Meds given.  Delay of transfer explained to pt.  Verbalized understanding.

## 2011-02-11 NOTE — ED Notes (Signed)
Pt c/o nausea.  edp notified, orders received.

## 2011-02-11 NOTE — Progress Notes (Signed)
[facility] [place]      Consult Note  Patient name: [name] MRN: [id number] DOB: [birth year] Sex: female  Consulting Physician: Hospitalist  Reason for Consult:  Chief Complaint  Patient presents with  . Leg Pain    HISTORY OF PRESENT ILLNESS: HPI this 41 year old female returns today for continued followup regarding her ischemic left foot she presented to the hospital on January 9 with thrombus in her infrarenal aorta having embolized to her left distal tibial vessels following 48 hours and following thrombosed lysis she had exploration of her left popliteal artery with thrombectomy of all tibial vessels and patch angioplasty. A thrombus in her aorta was no longer present on the angiogram having been lysed. She saw Dr.Lawson in the office Tuesday with complaints of pain.  She re-presented to the ER with worsening cellulitis and pain and was admitted for IV Abx.  Patient is a type 1 diabetic and will be managed by the hospitalists.  Hyperlipidemia is managed medically.  Past Medical History  Diagnosis Date  . Shingles     11/2010  . Renal disorder     glomerulonephritis  . Cellulitis     of face - 12/2010  . Peripheral vascular disease in diabetes mellitus   . Hyperlipidemia   . Tobacco abuse     0.5 - 1ppd x 21 yrs  . Headache   . Diabetes mellitus     Since Age 1    Past Surgical History  Procedure Date  . Wrist surgery     left  . Tubal ligation   . Embolectomy 01/29/2011    Procedure: EMBOLECTOMY;  Surgeon: Mal Misty, MD;  Location: [facility] OR;  Service: Vascular;  Laterality: Left;  Left Popliteal and Tibial Embolectomy with patch angioplasty    History   Social History  . Marital Status: Single    Spouse Name: N/A    Number of Children: N/A  . Years of Education: N/A   Occupational History  . Not on file.   Social History Main Topics  . Smoking status: Current Everyday Smoker -- 1.0 packs/day for 21 years    Types: Cigarettes    . Smokeless tobacco: Never Used  . Alcohol Use: No  . Drug Use: 1 per week    Special: Marijuana     was using monthly - last used 1 month ago.  Marland Kitchen Sexually Active: Yes    Birth Control/ Protection: Surgical   Other Topics Concern  . Not on file   Social History Narrative   Unemployed.  Lives in [place] with [place], [place], and mother.  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    Family History  Problem Relation Age of Onset  . COPD Mother     alive - 31  . Hypertension Mother   . Diabetes Father     alive - 45  . Hypertension Father   . Diabetes Mother   . Stroke Mother   . Coronary artery disease Mother   . Coronary artery disease Father     Allergies as of 02/11/2011 - Review Complete 02/11/2011  Allergen Reaction Noted  . Aspirin  10/16/2010  . Ibuprofen  11/03/2010    No current facility-administered medications on file prior to encounter.   Current Outpatient Prescriptions on File Prior to Encounter  Medication Sig Dispense Refill  . acetaminophen (TYLENOL) 500 MG tablet Take 1,000 mg by mouth every 6 (six) hours as needed. For pain       .  ALPRAZolam (XANAX) 1 MG tablet Take 1 mg by mouth daily as needed. Anxiety      . cephALEXin (KEFLEX) 500 MG capsule Take 1 capsule (500 mg total) by mouth 4 (four) times daily.  56 capsule  0  . diphenhydrAMINE (BENADRYL) 25 MG tablet Take 25 mg by mouth at bedtime as needed. For sleep      . furosemide (LASIX) 20 MG tablet Take 20 mg by mouth 3 (three) times a week. On Mondays, Wednesdays, and Fridays       . insulin aspart (NOVOLOG) 100 UNIT/ML injection Inject 10 Units into the skin 3 (three) times daily before meals.  1 vial  12  . insulin glargine (LANTUS) 100 UNIT/ML injection Inject 30 Units into the skin at bedtime.  10 mL  12  . Multiple Vitamins-Minerals (MULTIVITAMINS THER. W/MINERALS) TABS Take 1 tablet by mouth daily.        . potassium chloride SA (K-DUR,KLOR-CON) 20 MEQ tablet Take 0.5 tablets (10 mEq  total) by mouth daily.  7 tablet  0  . pregabalin (LYRICA) 50 MG capsule Take 1 capsule (50 mg total) by mouth 2 (two) times daily.  60 capsule  1  . sertraline (ZOLOFT) 50 MG tablet Take 50 mg by mouth daily.           REVIEW OF SYSTEMS: See HPI and admission H&P.   No changes  PHYSICAL EXAMINATION: General: The patient appears their stated age.  Vital signs are BP 113/69  Pulse 95  Temp(Src) 98.2 F (36.8 C) (Oral)  Resp 18  Ht 5\' 1"  (1.549 m)  Wt 135 lb 12.8 oz (61.598 kg)  BMI 25.66 kg/m2  SpO2 99%  LMP 01/11/2011 Pulmonary: Respirations are non-labored HEENT:  No gross abnormalities Abdomen: Soft and non-tender  Musculoskeletal: ischemia to   Left toes 4&5 and beginning on great toe Neurologic: No focal weakness or paresthesias are detected, Skin: There are no ulcer or rashes noted.  Erythema extending onto dorsum of left foot Psychiatric: The patient has normal affect. Cardiovascular: There is a regular rate and rhythm without significant murmur appreciated.  Pulses are not palpable on the left foot but it is warm with pitting edema of the leg up to the mid calf    Assessment:  Left foot cellulitis Plan: Patient will be placed on IV Abx.  I have recommended foot elevation.  She will be evaluated by Dr. Sharol Given and Dr. Kellie Simmering tomorrow for possible amputation of the involved digits No role for arterial exploration Con't pain control      V. Leia Alf, M.D. [facility] [place] Office: [phone] Pager:  [phone]

## 2011-02-11 NOTE — Discharge Summary (Signed)
Discharge summary  [facility] Discharge Summary     Patient ID:  [name] MRN: [id number] DOB/AGE: 04-13-1970 41 y.o.   Admit date: 01/27/2011 Discharge date: 02/04/2011 Date of Surgery: 01/27/2011 - 01/29/2011 Surgeon: Juliann Mule): Lisa Misty, MD   Admission Diagnosis: Vascular insufficiency [459.9] Lower extremity pain [729.5] LEG PAIN claudication Ischemic Left LE Left leg thrombus   Discharge Diagnoses:  Vascular insufficiency [459.9] Lower extremity pain [729.5] LEG PAIN claudication Ischemic Left LE Left leg thrombus   Secondary Diagnoses: Past Medical History   Diagnosis  Date   .  Diabetes mellitus         Since Age 89   .  Shingles         11/2010   .  Renal disorder         glomerulonephritis   .  Cellulitis         of face - 12/2010   .  Peripheral vascular disease in diabetes mellitus     .  Hyperlipidemia     .  Tobacco abuse         0.5 - 1ppd x 21 yrs      Procedures: Procedure(s): EMBOLECTOMY   Discharged Condition: good   HPI:  Ischemic left leg.   Patient continues to have a mottled left foot with an audible flow in dorsalis pedis and posterior tibial artery. She had thrombolyzes performed over past 48 hours as well as heparin infusion and angiography today reveals no thrombus remaining in the infrarenal aorta or iliac systems bilaterally. She does have some thrombus in left superficial femoral artery proximally and in all 3 tibial vessels on the left . Patient had 2-[name] echocardiogram with normal ejection fraction 65-70% and no thrombus noted in the left heart.   Labs:          Hospital Course:  [name] is a 41 y.o. female is S/P Left Tibial embolectomy. Procedure(s): EMBOLECTOMY Extubated: POD # 5 Post-op wounds clean, dry, intact or healing well Pt. Ambulating, voiding and taking PO diet without difficulty. Pt pain controlled with PO pain meds. Labs as  below Complications:none   Consults:   Treatment Team:  Lisa Misty, MD   Significant Diagnostic Studies: CBC    Component  Value  Date/Time     WBC  10.6*  02/04/2011 0535     RBC  3.60*  02/04/2011 0535     HGB  10.6*  02/04/2011 0535     HCT  32.5*  02/04/2011 0535     PLT  378  02/04/2011 0535     MCV  90.3  02/04/2011 0535     MCH  29.4  02/04/2011 0535     MCHC  32.6  02/04/2011 0535     RDW  14.9  02/04/2011 0535     LYMPHSABS  3.9  01/27/2011 1033     MONOABS  0.9  01/27/2011 1033     EOSABS  0.1  01/27/2011 1033     BASOSABS  0.0  01/27/2011 1033      BMET    Component  Value  Date/Time     NA  140  02/01/2011 0515     K  3.5  02/01/2011 0515     CL  110  02/01/2011 0515     CO2  22  02/01/2011 0515     GLUCOSE  72  02/01/2011 0515     BUN  8  02/01/2011 0515  CREATININE  1.04  02/01/2011 0515     CALCIUM  7.5*  02/01/2011 0515     GFRNONAA  66*  02/01/2011 0515     GFRAA  76*  02/01/2011 0515      COAG Lab Results   Component  Value  Date     INR  2.90*  02/04/2011     INR  2.21*  02/03/2011     INR  3.66*  02/02/2011        Disposition:   Discharge to :Home Discharge Orders      Future Orders  Please Complete By  Expires     Walker rolling          Resume previous diet          Call MD for:  temperature >100.5          Call MD for:  redness, tenderness, or signs of infection (pain, swelling, bleeding, redness, odor or green/yellow discharge around incision site)          Call MD for:  severe or increased pain, loss or decreased feeling  in affected limb(s)          may wash over wound with mild soap and water          No dressing needed              Lisa, [name]   Home Medication Instructions  [id number]     Printed on:02/04/11 [id number]   Medication Information                                      ALPRAZolam (XANAX) 1 MG tablet  Take 1 mg by mouth daily as needed. Anxiety                    sertraline (ZOLOFT) 50 MG tablet  Take 50 mg by mouth daily.                       potassium chloride SA (K-DUR,KLOR-CON) 20 MEQ tablet  Take 0.5 tablets (10 mEq total) by mouth daily.                    furosemide (LASIX) 20 MG tablet  Take 20 mg by mouth 3 (three) times a week. On Mondays, Wednesdays, and Fridays                     losartan (COZAAR) 25 MG tablet  Take 12.5 mg by mouth daily.                      fish oil-omega-3 fatty acids 1000 MG capsule  Take 1 g by mouth daily.                     acetaminophen (TYLENOL) 500 MG tablet  Take 1,000 mg by mouth every 6 (six) hours as needed. For pain                     diphenhydrAMINE (BENADRYL) 25 MG tablet  Take 25 mg by mouth at bedtime as needed. For sleep                    Multiple Vitamins-Minerals (MULTIVITAMINS THER. W/MINERALS) TABS  Take 1 tablet by mouth daily.  insulin aspart (NOVOLOG) 100 UNIT/ML injection  Inject 10 Units into the skin 3 (three) times daily before meals.                    insulin glargine (LANTUS) 100 UNIT/ML injection  Inject 30 Units into the skin at bedtime.                    HYDROmorphone (DILAUDID) 2 MG tablet  Take 1-2 tablets (2-4 mg total) by mouth every 4 (four) hours as needed.                    pregabalin (LYRICA) 50 MG capsule  Take 1 capsule (50 mg total) by mouth 2 (two) times daily.                    warfarin (COUMADIN) 1 MG tablet  3mg  po QD at 6pm.                       Verbal and written Discharge instructions given to the patient. Wound care per Discharge AVS Follow-up Information      Follow up with Lisa,JAMES D, MD in 4 weeks.     Contact information:     [name] [phone]               Signed: Roxy [name] 02/04/2011, 8:23 AM      Agree with above. Will see in 2 weeks.  Revision History...     Date/Time User Action   02/04/2011 10:46 AM Lisa Misty, MD Sign   02/04/2011 8:28 AM Lisa Horseman, PA Sign  View Details Report

## 2011-02-11 NOTE — ED Notes (Signed)
Pt c/o leg pain

## 2011-02-11 NOTE — H&P (Signed)
Hospital Admission Note Date: 02/11/2011  Patient name: [name]           Medical record number: [id number] Date of birth: [birth year]           Age: 41 y.o.   Gender: female    PCP:   Alonza Bogus, MD, MD   Chief Complaint:  Left Foot Cellulitis and Ischemic 4th and 5th left toes.  HPI: [name] is a 41 y.o. female with history of diabetes mellitus type 1. Patient came in to the hospital complaining about worsening left foot pain and redness. Patient was recently discharged from [facility] after she was admitted for ischemic left leg. Patient did have multiple angiograms and thrombectomy after she did not respond to thrombolysis. After discharge patient saw Dr. Kellie Simmering at his office and she was thought to have left foot cellulitis. Started on Keflex but did not help. Patient did have worsening pain and redness as well as swelling. Presented today to the emergency department and she was referred for admission.  Past Medical History: Past Medical History  Diagnosis Date  . Diabetes mellitus     Since Age 5  . Shingles     11/2010  . Renal disorder     glomerulonephritis  . Cellulitis     of face - 12/2010  . Peripheral vascular disease in diabetes mellitus   . Hyperlipidemia   . Tobacco abuse     0.5 - 1ppd x 21 yrs   Past Surgical History  Procedure Date  . Wrist surgery     left  . Tubal ligation   . Embolectomy 01/29/2011    Procedure: EMBOLECTOMY;  Surgeon: Mal Misty, MD;  Location: [facility] OR;  Service: Vascular;  Laterality: Left;  Left Popliteal and Tibial Embolectomy with patch angioplasty    Medications: Prior to Admission medications   Medication Sig Start Date End Date Taking? Authorizing Provider  acetaminophen (TYLENOL) 500 MG tablet Take 1,000 mg by mouth every 6 (six) hours as needed. For pain    Yes Historical Provider, MD  ALPRAZolam Duanne Moron) 1 MG tablet Take 1 mg by mouth daily as needed. Anxiety   Yes Historical Provider, MD    cephALEXin (KEFLEX) 500 MG capsule Take 1 capsule (500 mg total) by mouth 4 (four) times daily. 02/08/11 02/18/11 Yes V Annamarie Major, MD  diphenhydrAMINE (BENADRYL) 25 MG tablet Take 25 mg by mouth at bedtime as needed. For sleep   Yes Historical Provider, MD  fish oil-omega-3 fatty acids 1000 MG capsule Take 1 g by mouth daily.   Yes Historical Provider, MD  furosemide (LASIX) 20 MG tablet Take 20 mg by mouth 3 (three) times a week. On Mondays, Wednesdays, and Fridays    Yes Historical Provider, MD  HYDROcodone-acetaminophen (VICODIN) 5-500 MG per tablet Take 1 tablet by mouth every 4 (four) hours as needed. Pain 02/08/11  Yes V Annamarie Major, MD  HYDROmorphone (DILAUDID) 2 MG tablet Take 2-4 mg by mouth every 4 (four) hours as needed. 02/04/11 02/14/11 Yes Emma Gerri Lins, PA  insulin aspart (NOVOLOG) 100 UNIT/ML injection Inject 10 Units into the skin 3 (three) times daily before meals. 01/11/11 01/11/12 Yes Alonza Bogus, MD  insulin glargine (LANTUS) 100 UNIT/ML injection Inject 30 Units into the skin at bedtime. 01/11/11 01/11/12 Yes Alonza Bogus, MD  losartan (COZAAR) 25 MG tablet Take 12.5 mg by mouth daily.   Yes Historical Provider, MD  Multiple Vitamins-Minerals (MULTIVITAMINS THER. W/MINERALS) TABS Take  1 tablet by mouth daily.     Yes Historical Provider, MD  potassium chloride SA (K-DUR,KLOR-CON) 20 MEQ tablet Take 0.5 tablets (10 mEq total) by mouth daily. 10/09/10 10/09/11 Yes Terry S. Olin Hauser, MD  pregabalin (LYRICA) 50 MG capsule Take 1 capsule (50 mg total) by mouth 2 (two) times daily. 02/04/11 02/04/12 Yes Roxy Horseman, PA  sertraline (ZOLOFT) 50 MG tablet Take 50 mg by mouth daily.     Yes Historical Provider, MD  warfarin (COUMADIN) 1 MG tablet Take 3 mg by mouth daily. Takes at 6pm 02/04/11  Yes Roxy Horseman, [place]    Allergies:   Allergies  Allergen Reactions  . Aspirin     Cannot take this medication due to kidney disease  . Ibuprofen     Kidney     Social History:  reports that she has been smoking Cigarettes.  She has a 21 pack-year smoking history. She has never used smokeless tobacco. She reports that she uses illicit drugs (Marijuana) about once per week. She reports that she does not drink alcohol.  Family History: Family History  Problem Relation Age of Onset  . COPD Mother     alive - 60  . Hypertension Mother   . Diabetes Father     alive - 69  . Hypertension Father   . Diabetes Mother   . Stroke Mother   . Coronary artery disease Mother   . Coronary artery disease Father     Review of Systems:  Constitutional: negative for anorexia, fevers and sweats Eyes: negative for irritation, redness and visual disturbance Ears, nose, mouth, throat, and face: negative for earaches, epistaxis, nasal congestion and sore throat Respiratory: negative for cough, dyspnea on exertion, sputum and wheezing Cardiovascular: negative for chest pain, dyspnea, lower extremity edema, orthopnea, palpitations and syncope Gastrointestinal: negative for abdominal pain, constipation, diarrhea, melena, nausea and vomiting Genitourinary:negative for dysuria, frequency and hematuria Hematologic/lymphatic: negative for bleeding, easy bruising and lymphadenopathy Musculoskeletal:negative for arthralgias, muscle weakness and stiff joints Neurological: negative for coordination problems, gait problems, headaches and weakness Endocrine: negative for diabetic symptoms including polydipsia, polyuria and weight loss Allergic/Immunologic: negative for anaphylaxis, hay fever and urticaria  Physical Exam: BP 119/68  Pulse 89  Temp(Src) 98.2 F (36.8 C) (Oral)  Resp 18  Ht 5\' 1"  (1.549 m)  Wt 61.598 kg (135 lb 12.8 oz)  BMI 25.66 kg/m2  SpO2 98% General appearance: alert, cooperative and no distress  Head: Normocephalic, without obvious abnormality, atraumatic  Eyes: conjunctivae/corneas clear. PERRL, EOM's intact. Fundi benign.  Nose: Nares  normal. Septum midline. Mucosa normal. No drainage or sinus tenderness.  Throat: lips, mucosa, and tongue normal; teeth and gums normal  Neck: Supple, no masses, no cervical lymphadenopathy, [name] appreciated, no meningeal signs Resp: clear to auscultation bilaterally  Chest wall: no tenderness  Cardio: regular rate and rhythm, S1, S2 normal, no murmur, click, rub or gallop  GI: soft, non-tender; bowel sounds normal; no masses, no organomegaly  Extremities: extremities normal, atraumatic, no cyanosis or edema  Skin: Skin color, texture, turgor normal. No rashes or lesions  Neurologic: Alert and oriented X 3, normal strength and tone. Normal symmetric reflexes. Normal coordination and gait  Labs on Admission:   [facility] 02/11/11 0544  NA 137  K 3.3*  CL 103  CO2 26  GLUCOSE 130*  BUN 14  CREATININE 0.89  CALCIUM 7.5*  MG --  PHOS --    Basename 02/11/11 0544  AST 15  ALT 16  ALKPHOS 113  BILITOT <0.1*  PROT 5.0*  ALBUMIN 0.6*   Basename 02/11/11 0544  WBC 15.2*  NEUTROABS 9.9*  HGB 11.1*  HCT 33.5*  MCV 90.5  PLT 688*   Radiological Exams on Admission: Chest Portable 1 View Post Insertion To Confirm Placement As Interpreted By Radiologist  01/30/2011  **ADDENDUM** CREATED: 01/30/2011 12:39:49  PICC line should be retracted 4 cm to allow the tip to reside in the superior vena cava.  **END ADDENDUM** SIGNED BY: Lajean Manes, M.D.    01/30/2011  *RADIOLOGY REPORT*  Clinical Data: Left PICC placement  PORTABLE CHEST - 1 VIEW  Comparison: 01/27/2011  Findings: New left PICC has its tip in the right atrium.  No pneumothorax.  The heart, mediastinum and hila are unremarkable.  The lungs are clear.  IMPRESSION: New left PICC has its tip in the right atrium.  No acute cardiopulmonary disease.  Original Report Authenticated By: Lasandra Beech, M.D.   Dg Chest Portable 1 View  01/27/2011  *RADIOLOGY REPORT*  Clinical Data: Shortness of breath.  PORTABLE CHEST - 1 VIEW   Comparison: Chest 01/27/2011.  Findings: Lungs are clear.  Heart size is normal.  No pneumothorax or pleural effusion.  IMPRESSION: No acute disease.  Original Report Authenticated By: Arvid Right. D'ALESSIO, M.D.   Dg Chest Portable 1 View  01/27/2011  *RADIOLOGY REPORT*  Clinical Data: Left lower extremity pain, no pulses, hypertension  PORTABLE CHEST - 1 VIEW  Comparison: Portable exam 1029 hours compared to 03/21/2010  Findings: Normal heart size, mediastinal contours, and pulmonary vascularity. Mild peribronchial thickening. No pulmonary infiltrate, pleural effusion, or pneumothorax. Bones unremarkable.  IMPRESSION: Minimal chronic bronchitic changes.  Original Report Authenticated By: Burnetta Sabin, M.D.   Dg Ang/ext/uni/or Left  01/29/2011  *RADIOLOGY REPORT*  Clinical Data: Left popliteal embolectomy.  LEFT ANG/EXT/UNI/ OR  Comparison: None.  Findings: The distal popliteal artery is patent.  There is flow in the anterior tibial artery, posterior tibial artery and peroneal artery.  Limited evaluation of the distal runoff vessels.  There appears to be flow in the dorsalis pedis artery.  IMPRESSION: Patent distal popliteal artery and runoff vessels as described.  Original Report Authenticated By: Markus Daft, M.D.     IMPRESSION: Present on Admission:  .Cellulitis of left foot .Cellulitis and abscess of face .Diabetes mellitus .Peripheral vascular disease in diabetes mellitus .PVD (peripheral vascular disease)  Assessment/Plan  1. Cellulitis of the left foot: Patient was on Keflex. I will start patient on vancomycin IV. Our last pharmacy to dose it. Patient does have leukocytosis with neutrophilia suggesting the cellulitis.  2. Left fourth and fifth ischemic toes: Patient does have history of peripheral vascular disease with recent thrombosis of the superficial femoral artery status post embolectomy done by Dr. Kellie Simmering on 01/29/2011. ecent ABI from 02/01/2011 showed moderate to severe reduction  in the left dorsalis pedis. I called Dr. Sharol Given to see the patient.  3. Diabetes mellitus: I will obtain hemoglobin A1c. Patient will be on  Carbohydrate modified diet and insulin sliding scale. If patient goes to surgery she needs to be on D5 to minimize hypoglycemia affect.  4. Facial cellulitis: Patient did have sufficient cellulitis for some time since 01/09/2011. This is being treated more than once. She was going to be on vancomycin. Looks very mild.  5. Depression/anxiety: Continue current home medications.    Jahmeir Geisen A 02/11/2011, 11:35 AM

## 2011-02-12 LAB — BASIC METABOLIC PANEL
BUN: 10 mg/dL (ref 6–23)
CO2: 24 mEq/L (ref 19–32)
Calcium: 7.2 mg/dL — ABNORMAL LOW (ref 8.4–10.5)
Chloride: 110 mEq/L (ref 96–112)
Creatinine, Ser: 0.87 mg/dL (ref 0.50–1.10)
GFR calc Af Amer: >90 mL/min (ref >90)
GFR calc non Af Amer: 82 mL/min — ABNORMAL LOW (ref >90)
Glucose, Bld: 96 mg/dL (ref 70–99)
Potassium: 3.6 mEq/L (ref 3.5–5.1)
Sodium: 140 mEq/L (ref 135–145)

## 2011-02-12 LAB — CBC
HCT: 30.7 % — ABNORMAL LOW (ref 36.0–46.0)
Hemoglobin: 9.9 g/dL — ABNORMAL LOW (ref 12.0–15.0)
MCH: 29.7 pg (ref 26.0–34.0)
MCHC: 32.2 g/dL (ref 30.0–36.0)
MCV: 92.2 fL (ref 78.0–100.0)
Platelets: 663 10*3/uL — ABNORMAL HIGH (ref 150–400)
RBC: 3.33 MIL/uL — ABNORMAL LOW (ref 3.87–5.11)
RDW: 14.7 % (ref 11.5–15.5)
WBC: 12.7 10*3/uL — ABNORMAL HIGH (ref 4.0–10.5)

## 2011-02-12 LAB — GLUCOSE, CAPILLARY
Glucose-Capillary: 43 mg/dL — CL (ref 70–99)
Glucose-Capillary: 67 mg/dL — ABNORMAL LOW (ref 70–99)
Glucose-Capillary: 87 mg/dL (ref 70–99)
Glucose-Capillary: 150 mg/dL — ABNORMAL HIGH (ref 70–99)
Glucose-Capillary: 138 mg/dL — ABNORMAL HIGH (ref 70–99)
Glucose-Capillary: 158 mg/dL — ABNORMAL HIGH (ref 70–99)

## 2011-02-12 LAB — PROTIME-INR
INR: 2.15 — ABNORMAL HIGH (ref 0.00–1.49)
Prothrombin Time: 24.4 seconds — ABNORMAL HIGH (ref 11.6–15.2)

## 2011-02-12 MED ORDER — INSULIN GLARGINE 100 UNIT/ML ~~LOC~~ SOLN
25.0000 [IU] | Freq: Every day | SUBCUTANEOUS | Status: DC
  Administered 2011-02-12: 25 [IU] via SUBCUTANEOUS
  Filled 2011-02-12: qty 3

## 2011-02-12 MED ORDER — LORAZEPAM 2 MG/ML IJ SOLN: 0.5000 mg | Freq: Once | INTRAMUSCULAR | Status: DC

## 2011-02-12 MED ORDER — VANCOMYCIN HCL 1000 MG IV SOLR
750.0000 mg | Freq: Two times a day (BID) | INTRAVENOUS | Status: DC
  Administered 2011-02-12 – 2011-02-14 (×5): 750 mg via INTRAVENOUS
  Filled 2011-02-12 (×6): qty 750

## 2011-02-12 NOTE — Progress Notes (Signed)
DAILY PROGRESS NOTE                              [facility]  SUBJECTIVE: Pain is controlled. No fever or chills.  OBJECTIVE: BP 120/73  Pulse 86  Temp(Src) 98.4 F (36.9 C) (Oral)  Resp 20  Ht 5\' 1"  (1.549 m)  Wt 61.598 kg (135 lb 12.8 oz)  BMI 25.66 kg/m2  SpO2 96%  LMP 01/11/2011  Intake/Output Summary (Last 24 hours) at 02/12/11 1334 Last data filed at 02/12/11 1104  Gross per 24 hour  Intake 2589.58 ml  Output    301 ml  Net 2288.58 ml                      Weight change:  Physical Exam: General: Alert and awake oriented x3 not in any acute distress. HEENT: anicteric sclera, pupils equal reactive to light and accommodation CVS: S1-S2 heard, no murmur rubs or gallops Chest: clear to auscultation bilaterally, no wheezing rales or rhonchi Abdomen:  normal bowel sounds, soft, nontender, nondistended, no organomegaly Neuro: Cranial nerves II-XII intact, no focal neurological deficits Extremities: There is cyanosis of gangrenous toes. First, fourth and fifth left toes.   Lab Results:  [facility] 02/12/11 0542 02/11/11 0544  NA 140 137  K 3.6 3.3*  CL 110 103  CO2 24 26  GLUCOSE 96 130*  BUN 10 14  CREATININE 0.87 0.89  CALCIUM 7.2* 7.5*  MG -- --  PHOS -- --    Basename 02/11/11 0544  AST 15  ALT 16  ALKPHOS 113  BILITOT <0.1*  PROT 5.0*  ALBUMIN 0.6*   Basename 02/12/11 0542 02/11/11 0544  WBC 12.7* 15.2*  NEUTROABS -- 9.9*  HGB 9.9* 11.1*  HCT 30.7* 33.5*  MCV 92.2 90.5  PLT 663* 688*   Basename 02/11/11 1319  HGBA1C 8.0*   Micro Results: No results found for this or any previous visit (from the past 240 hour(s)).  Studies/Results: Chest Portable 1 View Post Insertion To Confirm Placement As Interpreted By Radiologist  01/30/2011  **ADDENDUM** CREATED: 01/30/2011 12:39:49  PICC line should be retracted 4 cm to allow the tip to reside in the superior vena cava.  **END ADDENDUM** SIGNED BY: Lajean Manes, M.D.     01/30/2011  *RADIOLOGY REPORT*  Clinical Data: Left PICC placement  PORTABLE CHEST - 1 VIEW  Comparison: 01/27/2011  Findings: New left PICC has its tip in the right atrium.  No pneumothorax.  The heart, mediastinum and hila are unremarkable.  The lungs are clear.  IMPRESSION: New left PICC has its tip in the right atrium.  No acute cardiopulmonary disease.  Original Report Authenticated By: Lasandra Beech, M.D.   Dg Chest Portable 1 View  01/27/2011  *RADIOLOGY REPORT*  Clinical Data: Shortness of breath.  PORTABLE CHEST - 1 VIEW  Comparison: Chest 01/27/2011.  Findings: Lungs are clear.  Heart size is normal.  No pneumothorax or pleural effusion.  IMPRESSION: No acute disease.  Original Report Authenticated By: Arvid Right. D'ALESSIO, M.D.   Dg Chest Portable 1 View  01/27/2011  *RADIOLOGY REPORT*  Clinical Data: Left lower extremity pain, no pulses, hypertension  PORTABLE CHEST - 1 VIEW  Comparison: Portable exam 1029 hours compared to 03/21/2010  Findings: Normal heart size, mediastinal contours, and pulmonary vascularity. Mild peribronchial thickening. No pulmonary infiltrate, pleural effusion, or pneumothorax. Bones unremarkable.  IMPRESSION: Minimal chronic bronchitic changes.  Original Report Authenticated By: Burnetta Sabin, M.D.   Dg Ang/ext/uni/or Left  01/29/2011  *RADIOLOGY REPORT*  Clinical Data: Left popliteal embolectomy.  LEFT ANG/EXT/UNI/ OR  Comparison: None.  Findings: The distal popliteal artery is patent.  There is flow in the anterior tibial artery, posterior tibial artery and peroneal artery.  Limited evaluation of the distal runoff vessels.  There appears to be flow in the dorsalis pedis artery.  IMPRESSION: Patent distal popliteal artery and runoff vessels as described.  Original Report Authenticated By: Markus Daft, M.D.   Medications: Scheduled Meds:   . insulin aspart  0-15 Units Subcutaneous TID WC  . insulin glargine  30 Units Subcutaneous QHS  . LORazepam  0.5 mg  Intravenous Once  . losartan  12.5 mg Oral Daily  . multivitamins ther. w/minerals  1 tablet Oral Daily  . polyethylene glycol  17 g Oral Daily  . pregabalin  50 mg Oral BID  . sertraline  50 mg Oral Daily  . vancomycin  750 mg Intravenous Q12H  . warfarin  3 mg Oral q1800   Continuous Infusions:   . sodium chloride 125 mL/hr at 02/12/11 0754   PRN Meds:.acetaminophen, acetaminophen, ALPRAZolam, diphenhydrAMINE, HYDROmorphone, ondansetron (ZOFRAN) IV, ondansetron, oxyCODONE, DISCONTD: diphenhydrAMINE  ASSESSMENT & PLAN: Active Problems:  Cellulitis and abscess of face  Diabetes mellitus  Peripheral vascular disease in diabetes mellitus  PVD (peripheral vascular disease)  Cellulitis of left foot  Cellulitis of the left foot Patient was on Keflex. That was discontinued and started on vancomycin. Probably can switch in one or 2 days to oral antibiotics. We'll consider clindamycin or Bactrim.  Left first, fourth and fifth ischemic toes  Patient does have peripheral vascular disease likely secondary to her diabetes. His recent thrombosis of the superficial femoral artery status post thrombectomy and exploration of the left popliteal vein. Patient had angiogram showed patent vessels. Except her left dorsalis pedis showed in the ABI to be moderate to severe flow reduction. Dr. Sharol Given evaluated the patient and recommended to treat the cellulitis and refer the patient to him as outpatient for possible forefoot amputation.  Diabetes mellitus Insulin-dependent, on carbohydrate modified diet. Hemoglobin A1c is 8.0 better control since March of 2012 was 11.0. Patient did have low blood sugar. Patient said she is eating more at home. I'll decrease her Lantus dose while she is in the hospital here.  Facial cellulitis Improved very much and almost resolved. Patient is on vancomycin  Depression/anxiety Continue current home medications.  Disposition Home in 1-2 days. Will switch to oral  antibiotics in the morning.   LOS: 1 day   Analaya Hoey A 02/12/2011, 1:34 PM

## 2011-02-12 NOTE — Consult Note (Signed)
Reason for Consult:gangrene left foot  Referring Physician: Dr. Jeronimo [name] is an 41 y.o. female.  HPI: revasc left lower ext, s/p aortic clot  Past Medical History  Diagnosis Date  . Shingles     11/2010  . Renal disorder     glomerulonephritis  . Cellulitis     of face - 12/2010  . Peripheral vascular disease in diabetes mellitus   . Hyperlipidemia   . Tobacco abuse     0.5 - 1ppd x 21 yrs  . Headache   . Diabetes mellitus     Since Age 1    Past Surgical History  Procedure Date  . Wrist surgery     left  . Tubal ligation   . Embolectomy 01/29/2011    Procedure: EMBOLECTOMY;  Surgeon: Mal Misty, MD;  Location: [facility] OR;  Service: Vascular;  Laterality: Left;  Left Popliteal and Tibial Embolectomy with patch angioplasty    Family History  Problem Relation Age of Onset  . COPD Mother     alive - 75  . Hypertension Mother   . Diabetes Father     alive - 37  . Hypertension Father   . Diabetes Mother   . Stroke Mother   . Coronary artery disease Mother   . Coronary artery disease Father     Social History:  reports that she has been smoking Cigarettes.  She has a 21 pack-year smoking history. She has never used smokeless tobacco. She reports that she uses illicit drugs (Marijuana) about once per week. She reports that she does not drink alcohol.  Allergies:  Allergies  Allergen Reactions  . Aspirin     Cannot take this medication due to kidney disease  . Ibuprofen     Kidney    Medications: I have reviewed the patient's current medications.  Results for orders placed during the hospital encounter of 02/11/11 (from the past 48 hour(s))  CBC     Status: Abnormal   Collection Time   02/11/11  5:44 AM      Component Value Range Comment   WBC 15.2 (*) 4.0 - 10.5 (K/uL)    RBC 3.70 (*) 3.87 - 5.11 (MIL/uL)    Hemoglobin 11.1 (*) 12.0 - 15.0 (g/dL)    HCT 33.5 (*) 36.0 - 46.0 (%)    MCV 90.5  78.0 - 100.0 (fL)    MCH 30.0  26.0 - 34.0 (pg)    MCHC 33.1  30.0 - 36.0 (g/dL)    RDW 14.4  11.5 - 15.5 (%)    Platelets 688 (*) 150 - 400 (K/uL)   DIFFERENTIAL     Status: Abnormal   Collection Time   02/11/11  5:44 AM      Component Value Range Comment   Neutrophils Relative 65  43 - 77 (%)    Neutro Abs 9.9 (*) 1.7 - 7.7 (K/uL)    Lymphocytes Relative 24  12 - 46 (%)    Lymphs Abs 3.6  0.7 - 4.0 (K/uL)    Monocytes Relative 6  3 - 12 (%)    Monocytes Absolute 0.9  0.1 - 1.0 (K/uL)    Eosinophils Relative 4  0 - 5 (%)    Eosinophils Absolute 0.7  0.0 - 0.7 (K/uL)    Basophils Relative 0  0 - 1 (%)    Basophils Absolute 0.1  0.0 - 0.1 (K/uL)   COMPREHENSIVE METABOLIC PANEL     Status: Abnormal  Collection Time   02/11/11  5:44 AM      Component Value Range Comment   Sodium 137  135 - 145 (mEq/L)    Potassium 3.3 (*) 3.5 - 5.1 (mEq/L)    Chloride 103  96 - 112 (mEq/L)    CO2 26  19 - 32 (mEq/L)    Glucose, Bld 130 (*) 70 - 99 (mg/dL)    BUN 14  6 - 23 (mg/dL)    Creatinine, Ser 0.89  0.50 - 1.10 (mg/dL)    Calcium 7.5 (*) 8.4 - 10.5 (mg/dL)    Total Protein 5.0 (*) 6.0 - 8.3 (g/dL)    Albumin 0.6 (*) 3.5 - 5.2 (g/dL)    AST 15  0 - 37 (U/L)    ALT 16  0 - 35 (U/L)    Alkaline Phosphatase 113  39 - 117 (U/L)    Total Bilirubin <0.1 (*) 0.3 - 1.2 (mg/dL)    GFR calc non Af Amer 79 (*) >90 (mL/min)    GFR calc Af Amer >90  >90 (mL/min)   GLUCOSE, CAPILLARY     Status: Normal   Collection Time   02/11/11  7:30 AM      Component Value Range Comment   Glucose-Capillary 75  70 - 99 (mg/dL)    Comment 1 Notify RN     GLUCOSE, CAPILLARY     Status: Abnormal   Collection Time   02/11/11 10:03 AM      Component Value Range Comment   Glucose-Capillary 43 (*) 70 - 99 (mg/dL)    Comment 1 Documented in Chart      Comment 2 Notify RN     GLUCOSE, CAPILLARY     Status: Abnormal   Collection Time   02/11/11 10:29 AM      Component Value Range Comment   Glucose-Capillary 67 (*) 70 - 99 (mg/dL)    Comment 1 Documented in Chart       Comment 2 Notify RN     GLUCOSE, CAPILLARY     Status: Normal   Collection Time   02/11/11 10:54 AM      Component Value Range Comment   Glucose-Capillary 72  70 - 99 (mg/dL)   HEMOGLOBIN A1C     Status: Abnormal   Collection Time   02/11/11  1:19 PM      Component Value Range Comment   Hemoglobin A1C 8.0 (*) <5.7 (%)    Mean Plasma Glucose 183 (*) <117 (mg/dL)   PROTIME-INR     Status: Abnormal   Collection Time   02/11/11  1:19 PM      Component Value Range Comment   Prothrombin Time 23.9 (*) 11.6 - 15.2 (seconds)    INR 2.10 (*) 0.00 - 1.49    URINALYSIS, ROUTINE W REFLEX MICROSCOPIC     Status: Abnormal   Collection Time   02/11/11  1:50 PM      Component Value Range Comment   Color, Urine YELLOW  YELLOW     APPearance CLOUDY (*) CLEAR     Specific Gravity, Urine 1.016  1.005 - 1.030     pH 7.5  5.0 - 8.0     Glucose, UA 250 (*) NEGATIVE (mg/dL)    Hgb urine dipstick SMALL (*) NEGATIVE     Bilirubin Urine NEGATIVE  NEGATIVE     Ketones, ur NEGATIVE  NEGATIVE (mg/dL)    Protein, ur >300 (*) NEGATIVE (mg/dL)    Urobilinogen, UA 0.2  0.0 -  1.0 (mg/dL)    Nitrite NEGATIVE  NEGATIVE     Leukocytes, UA NEGATIVE  NEGATIVE    URINE MICROSCOPIC-ADD ON     Status: Abnormal   Collection Time   02/11/11  1:50 PM      Component Value Range Comment   Squamous Epithelial / LPF MANY (*) RARE     WBC, UA 0-2  <3 (WBC/hpf)    RBC / HPF 0-2  <3 (RBC/hpf)    Bacteria, UA FEW (*) RARE    GLUCOSE, CAPILLARY     Status: Abnormal   Collection Time   02/11/11  4:50 PM      Component Value Range Comment   Glucose-Capillary 156 (*) 70 - 99 (mg/dL)   GLUCOSE, CAPILLARY     Status: Abnormal   Collection Time   02/11/11  9:38 PM      Component Value Range Comment   Glucose-Capillary 62 (*) 70 - 99 (mg/dL)   GLUCOSE, CAPILLARY     Status: Abnormal   Collection Time   02/11/11  9:57 PM      Component Value Range Comment   Glucose-Capillary 62 (*) 70 - 99 (mg/dL)   GLUCOSE, CAPILLARY     Status:  Normal   Collection Time   02/11/11 10:37 PM      Component Value Range Comment   Glucose-Capillary 84  70 - 99 (mg/dL)   CBC     Status: Abnormal   Collection Time   02/12/11  5:42 AM      Component Value Range Comment   WBC 12.7 (*) 4.0 - 10.5 (K/uL)    RBC 3.33 (*) 3.87 - 5.11 (MIL/uL)    Hemoglobin 9.9 (*) 12.0 - 15.0 (g/dL)    HCT 30.7 (*) 36.0 - 46.0 (%)    MCV 92.2  78.0 - 100.0 (fL)    MCH 29.7  26.0 - 34.0 (pg)    MCHC 32.2  30.0 - 36.0 (g/dL)    RDW 14.7  11.5 - 15.5 (%)    Platelets 663 (*) 150 - 400 (K/uL)   BASIC METABOLIC PANEL     Status: Abnormal   Collection Time   02/12/11  5:42 AM      Component Value Range Comment   Sodium 140  135 - 145 (mEq/L)    Potassium 3.6  3.5 - 5.1 (mEq/L)    Chloride 110  96 - 112 (mEq/L)    CO2 24  19 - 32 (mEq/L)    Glucose, Bld 96  70 - 99 (mg/dL)    BUN 10  6 - 23 (mg/dL)    Creatinine, Ser 0.87  0.50 - 1.10 (mg/dL)    Calcium 7.2 (*) 8.4 - 10.5 (mg/dL)    GFR calc non Af Amer 82 (*) >90 (mL/min)    GFR calc Af Amer >90  >90 (mL/min)   PROTIME-INR     Status: Abnormal   Collection Time   02/12/11  5:42 AM      Component Value Range Comment   Prothrombin Time 24.4 (*) 11.6 - 15.2 (seconds)    INR 2.15 (*) 0.00 - 1.49      No results found.  Review of Systems  All other systems reviewed and are negative.   Blood pressure 120/73, pulse 86, temperature 98.4 F (36.9 C), temperature source Oral, resp. rate 20, height 5\' 1"  (1.549 m), weight 61.598 kg (135 lb 12.8 oz), last menstrual period 01/11/2011, SpO2 96.00%. 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 Will observe and will need some type of forefoot amputation, I'll f/u as outpatient  Marthann Abshier V 02/12/2011, 11:51 AM

## 2011-02-12 NOTE — Progress Notes (Signed)
Inpatient Diabetes Program Recommendations  AACE/ADA: New Consensus Statement on Inpatient Glycemic Control (2009)  Target Ranges:  Prepandial:   less than 140 mg/dL      Peak postprandial:   less than 180 mg/dL (1-2 hours)      Critically ill patients:  140 - 180 mg/dL   Reason for Visit: Note hypoglycemic event 02/11/11  Inpatient Diabetes Program Recommendations Correction (SSI): Please decrease correction coverage to sensitive tid with meals.  Note: A1C=8.0%.

## 2011-02-12 NOTE — Progress Notes (Signed)
ANTIBIOTIC CONSULT NOTE - INITIAL  Pharmacy Consult for vancomycin Indication: cellulitis  Allergies  Allergen Reactions  . Aspirin     Cannot take this medication due to kidney disease  . Ibuprofen     Kidney    Patient Measurements: Height: 5\' 1"  (154.9 cm) Weight: 135 lb 12.8 oz (61.598 kg) IBW/kg (Calculated) : 47.8   Vital Signs: Temp: 98.4 F (36.9 C) (01/25 0612) Temp src: Oral (01/25 0612) BP: 120/73 mmHg (01/25 0612) Pulse Rate: 86  (01/25 0612) Intake/Output from previous day: 01/24 0701 - 01/25 0700 In: 2571.6 [P.O.:582; I.V.:1989.6] Out: 1 [Stool:1] Intake/Output from this shift: Total I/O In: -  Out: 300 [Urine:300]  Labs:  [facility] 02/12/11 0542 02/11/11 0544  WBC 12.7* 15.2*  HGB 9.9* 11.1*  PLT 663* 688*  LABCREA -- --  CREATININE 0.87 0.89   Estimated Creatinine Clearance: 71.6 ml/min (by C-G formula based on Cr of 0.87). Microbiology: Recent Results (from the past 720 hour(s))  MRSA PCR SCREENING     Status: Normal   Collection Time   01/27/11  6:11 PM      Component Value Range Status Comment   MRSA by PCR NEGATIVE  NEGATIVE  Final     Medical History: Past Medical History  Diagnosis Date  . Shingles     11/2010  . Renal disorder     glomerulonephritis  . Cellulitis     of face - 12/2010  . Peripheral vascular disease in diabetes mellitus   . Hyperlipidemia   . Tobacco abuse     0.5 - 1ppd x 21 yrs  . Headache   . Diabetes mellitus     Since Age 43    Medications:  Prescriptions prior to admission  Medication Sig Dispense Refill  . acetaminophen (TYLENOL) 500 MG tablet Take 1,000 mg by mouth every 6 (six) hours as needed. For pain       . ALPRAZolam (XANAX) 1 MG tablet Take 1 mg by mouth daily as needed. Anxiety      . cephALEXin (KEFLEX) 500 MG capsule Take 1 capsule (500 mg total) by mouth 4 (four) times daily.  56 capsule  0  . diphenhydrAMINE (BENADRYL) 25 MG tablet Take 25 mg by mouth at bedtime as needed. For sleep        . fish oil-omega-3 fatty acids 1000 MG capsule Take 1 g by mouth daily.      . furosemide (LASIX) 20 MG tablet Take 20 mg by mouth 3 (three) times a week. On Mondays, Wednesdays, and Fridays       . HYDROcodone-acetaminophen (VICODIN) 5-500 MG per tablet Take 1 tablet by mouth every 4 (four) hours as needed. Pain      . HYDROmorphone (DILAUDID) 2 MG tablet Take 2-4 mg by mouth every 4 (four) hours as needed.      . insulin aspart (NOVOLOG) 100 UNIT/ML injection Inject 10 Units into the skin 3 (three) times daily before meals.  1 vial  12  . insulin glargine (LANTUS) 100 UNIT/ML injection Inject 30 Units into the skin at bedtime.  10 mL  12  . losartan (COZAAR) 25 MG tablet Take 12.5 mg by mouth daily.      . Multiple Vitamins-Minerals (MULTIVITAMINS THER. W/MINERALS) TABS Take 1 tablet by mouth daily.        . potassium chloride SA (K-DUR,KLOR-CON) 20 MEQ tablet Take 0.5 tablets (10 mEq total) by mouth daily.  7 tablet  0  . pregabalin (LYRICA) 50 MG  capsule Take 1 capsule (50 mg total) by mouth 2 (two) times daily.  60 capsule  1  . sertraline (ZOLOFT) 50 MG tablet Take 50 mg by mouth daily.        Marland Kitchen warfarin (COUMADIN) 1 MG tablet Take 3 mg by mouth daily. Takes at 6pm       Scheduled:    . insulin aspart  0-15 Units Subcutaneous TID WC  . insulin glargine  30 Units Subcutaneous QHS  . LORazepam  0.5 mg Intravenous Once  . losartan  12.5 mg Oral Daily  . multivitamins ther. w/minerals  1 tablet Oral Daily  . polyethylene glycol  17 g Oral Daily  . pregabalin  50 mg Oral BID  . sertraline  50 mg Oral Daily  . vancomycin  750 mg Intravenous Q12H  . warfarin  3 mg Oral q1800   Assessment: 41 yo female with left Foot Cellulitis and Ischemic 4th and 5th left toes to start vancomycin.   Goal of Therapy:  Vancomycin trough level 10-15 mcg/ml  Plan:  -Will follow renal function and levels as needed.  Dareen Piano 02/12/2011,9:59 AM

## 2011-02-13 LAB — GLUCOSE, CAPILLARY
Glucose-Capillary: 45 mg/dL — ABNORMAL LOW (ref 70–99)
Glucose-Capillary: 49 mg/dL — ABNORMAL LOW (ref 70–99)
Glucose-Capillary: 76 mg/dL (ref 70–99)
Glucose-Capillary: 123 mg/dL — ABNORMAL HIGH (ref 70–99)
Glucose-Capillary: 60 mg/dL — ABNORMAL LOW (ref 70–99)
Glucose-Capillary: 75 mg/dL (ref 70–99)
Glucose-Capillary: 112 mg/dL — ABNORMAL HIGH (ref 70–99)

## 2011-02-13 LAB — COMPREHENSIVE METABOLIC PANEL
ALT: 11 U/L (ref 0–35)
AST: 14 U/L (ref 0–37)
Albumin: 0.7 g/dL — ABNORMAL LOW (ref 3.5–5.2)
Alkaline Phosphatase: 86 U/L (ref 39–117)
BUN: 13 mg/dL (ref 6–23)
CO2: 24 mEq/L (ref 19–32)
Calcium: 7.8 mg/dL — ABNORMAL LOW (ref 8.4–10.5)
Chloride: 110 mEq/L (ref 96–112)
Creatinine, Ser: 0.71 mg/dL (ref 0.50–1.10)
GFR calc Af Amer: >90 mL/min (ref >90)
GFR calc non Af Amer: >90 mL/min (ref >90)
Glucose, Bld: 47 mg/dL — ABNORMAL LOW (ref 70–99)
Potassium: 3.6 mEq/L (ref 3.5–5.1)
Sodium: 142 mEq/L (ref 135–145)
Total Bilirubin: 0.1 mg/dL — ABNORMAL LOW (ref 0.3–1.2)
Total Protein: 4.5 g/dL — ABNORMAL LOW (ref 6.0–8.3)

## 2011-02-13 LAB — PROTIME-INR
INR: 1.63 — ABNORMAL HIGH (ref 0.00–1.49)
Prothrombin Time: 19.6 seconds — ABNORMAL HIGH (ref 11.6–15.2)

## 2011-02-13 LAB — PREALBUMIN: Prealbumin: 8.8 mg/dL — ABNORMAL LOW (ref 17.0–34.0)

## 2011-02-13 MED ORDER — WARFARIN SODIUM 3 MG PO TABS
3.0000 mg | ORAL_TABLET | Freq: Every day | ORAL | Status: DC
  Filled 2011-02-13: qty 1

## 2011-02-13 MED ORDER — WARFARIN SODIUM 5 MG PO TABS
5.0000 mg | ORAL_TABLET | Freq: Once | ORAL | Status: AC
  Administered 2011-02-13: 5 mg via ORAL
  Filled 2011-02-13: qty 1

## 2011-02-13 MED ORDER — INSULIN GLARGINE 100 UNIT/ML ~~LOC~~ SOLN
20.0000 [IU] | Freq: Every day | SUBCUTANEOUS | Status: DC
  Administered 2011-02-13: 20 [IU] via SUBCUTANEOUS

## 2011-02-13 MED ORDER — WHITE PETROLATUM GEL
Status: AC
  Administered 2011-02-13: 16:00:00
  Filled 2011-02-13: qty 5

## 2011-02-13 MED ORDER — SODIUM CHLORIDE 0.9 % IV SOLN
INTRAVENOUS | Status: DC
  Administered 2011-02-13: 16:00:00 via INTRAVENOUS

## 2011-02-13 NOTE — Progress Notes (Signed)
ANTIBIOTIC CONSULT NOTE - INITIAL  Pharmacy Consult for vancomycin Indication: cellulitis  Allergies  Allergen Reactions  . Aspirin     Cannot take this medication due to kidney disease  . Ibuprofen     Kidney    Patient Measurements: Height: 5\' 1"  (154.9 cm) Weight: 135 lb 12.8 oz (61.598 kg) IBW/kg (Calculated) : 47.8   Vital Signs: Temp: 98.2 F (36.8 C) (01/26 0505) Temp src: Oral (01/25 2131) BP: 123/73 mmHg (01/26 0505) Pulse Rate: 88  (01/26 0505) Intake/Output from previous day: 01/25 0701 - 01/26 0700 In: 510 [P.O.:360; IV Piggyback:150] Out: 300 [Urine:300] Intake/Output from this shift:    Labs:  Basename 02/13/11 0700 02/12/11 0542 02/11/11 0544  WBC -- 12.7* 15.2*  HGB -- 9.9* 11.1*  PLT -- 663* 688*  LABCREA -- -- --  CREATININE 0.71 0.87 0.89   Estimated Creatinine Clearance: 77.9 ml/min (by C-G formula based on Cr of 0.71). Microbiology: Recent Results (from the past 720 hour(s))  MRSA PCR SCREENING     Status: Normal   Collection Time   01/27/11  6:11 PM      Component Value Range Status Comment   MRSA by PCR NEGATIVE  NEGATIVE  Final     Medical History: Past Medical History  Diagnosis Date  . Shingles     11/2010  . Renal disorder     glomerulonephritis  . Cellulitis     of face - 12/2010  . Peripheral vascular disease in diabetes mellitus   . Hyperlipidemia   . Tobacco abuse     0.5 - 1ppd x 21 yrs  . Headache   . Diabetes mellitus     Since Age 89    Medications:  Prescriptions prior to admission  Medication Sig Dispense Refill  . acetaminophen (TYLENOL) 500 MG tablet Take 1,000 mg by mouth every 6 (six) hours as needed. For pain       . ALPRAZolam (XANAX) 1 MG tablet Take 1 mg by mouth daily as needed. Anxiety      . cephALEXin (KEFLEX) 500 MG capsule Take 1 capsule (500 mg total) by mouth 4 (four) times daily.  56 capsule  0  . diphenhydrAMINE (BENADRYL) 25 MG tablet Take 25 mg by mouth at bedtime as needed. For sleep        . fish oil-omega-3 fatty acids 1000 MG capsule Take 1 g by mouth daily.      . furosemide (LASIX) 20 MG tablet Take 20 mg by mouth 3 (three) times a week. On Mondays, Wednesdays, and Fridays       . HYDROcodone-acetaminophen (VICODIN) 5-500 MG per tablet Take 1 tablet by mouth every 4 (four) hours as needed. Pain      . HYDROmorphone (DILAUDID) 2 MG tablet Take 2-4 mg by mouth every 4 (four) hours as needed.      . insulin aspart (NOVOLOG) 100 UNIT/ML injection Inject 10 Units into the skin 3 (three) times daily before meals.  1 vial  12  . insulin glargine (LANTUS) 100 UNIT/ML injection Inject 30 Units into the skin at bedtime.  10 mL  12  . losartan (COZAAR) 25 MG tablet Take 12.5 mg by mouth daily.      . Multiple Vitamins-Minerals (MULTIVITAMINS THER. W/MINERALS) TABS Take 1 tablet by mouth daily.        . potassium chloride SA (K-DUR,KLOR-CON) 20 MEQ tablet Take 0.5 tablets (10 mEq total) by mouth daily.  7 tablet  0  . pregabalin (LYRICA)  50 MG capsule Take 1 capsule (50 mg total) by mouth 2 (two) times daily.  60 capsule  1  . sertraline (ZOLOFT) 50 MG tablet Take 50 mg by mouth daily.        Marland Kitchen warfarin (COUMADIN) 1 MG tablet Take 3 mg by mouth daily. Takes at 6pm       Scheduled:     . insulin aspart  0-15 Units Subcutaneous TID WC  . insulin glargine  25 Units Subcutaneous QHS  . LORazepam  0.5 mg Intravenous Once  . losartan  12.5 mg Oral Daily  . multivitamins ther. w/minerals  1 tablet Oral Daily  . polyethylene glycol  17 g Oral Daily  . pregabalin  50 mg Oral BID  . sertraline  50 mg Oral Daily  . vancomycin  750 mg Intravenous Q12H  . warfarin  3 mg Oral q1800  . DISCONTD: insulin glargine  30 Units Subcutaneous QHS   Assessment: 41 yo female with left Foot Cellulitis and Ischemic 4th and 5th left toes to start vancomycin. Plan to change to PO abx soon. Pt is also coumadin for leg thrombosis. INR down to 1.63 today.    Goal of Therapy:  Vancomycin trough level 10-15  mcg/ml  Plan:  -Cont vanc 750mg  IV q12 for now -Coumadin 5mg  x1 today then 3mg  qday  Onnie Boer [place] 02/13/2011,9:27 AM

## 2011-02-13 NOTE — Progress Notes (Signed)
Patient ID: [name], female   DOB: [birth year], 41 y.o.   MRN: [id number] Much less erythema in her dorsum of her right foot according to the patient.remains afebrile. She does have a palpable posterior tibial pulse at the ankle.Stable from a vascular standpoint.May be okay for discharge with oral antibiotics andoral pain control.

## 2011-02-13 NOTE — Plan of Care (Signed)
Problem: Phase I Progression Outcomes Goal: OOB as tolerated unless otherwise ordered Outcome: Completed/Met Date Met:  02/13/11 OOB to [facility] with supervision Goal: Initial discharge plan identified Outcome: Completed/Met Date Met:  02/13/11 Pt to return home when medically cleared Goal: Hemodynamically stable Outcome: Progressing Low CBG this AM lantus adjusted will continue to monitor CBG's

## 2011-02-13 NOTE — Progress Notes (Signed)
DAILY PROGRESS NOTE                              [facility]  SUBJECTIVE: Pain is controlled. No fever or chills. Had another episode of hypoglycemia this morning.  OBJECTIVE: BP 123/73  Pulse 88  Temp(Src) 98.2 F (36.8 C) (Oral)  Resp 20  Ht 5\' 1"  (1.549 m)  Wt 61.598 kg (135 lb 12.8 oz)  BMI 25.66 kg/m2  SpO2 95%  LMP 01/11/2011  Intake/Output Summary (Last 24 hours) at 02/13/11 0945 Last data filed at 02/12/11 2318  Gross per 24 hour  Intake    510 ml  Output      0 ml  Net    510 ml                      Weight change:  Physical Exam: General: Alert and awake oriented x3 not in any acute distress. HEENT: anicteric sclera, pupils equal reactive to light and accommodation CVS: S1-S2 heard, no murmur rubs or gallops Chest: clear to auscultation bilaterally, no wheezing rales or rhonchi Abdomen:  normal bowel sounds, soft, nontender, nondistended, no organomegaly Neuro: Cranial nerves II-XII intact, no focal neurological deficits Extremities: There is cyanosis of gangrenous toes. First, fourth and fifth left toes.   Lab Results:  [facility] 02/13/11 0700 02/12/11 0542  NA 142 140  K 3.6 3.6  CL 110 110  CO2 24 24  GLUCOSE 47* 96  BUN 13 10  CREATININE 0.71 0.87  CALCIUM 7.8* 7.2*  MG -- --  PHOS -- --    Basename 02/13/11 0700 02/11/11 0544  AST 14 15  ALT 11 16  ALKPHOS 86 113  BILITOT 0.1* <0.1*  PROT 4.5* 5.0*  ALBUMIN 0.7* 0.6*    Basename 02/12/11 0542 02/11/11 0544  WBC 12.7* 15.2*  NEUTROABS -- 9.9*  HGB 9.9* 11.1*  HCT 30.7* 33.5*  MCV 92.2 90.5  PLT 663* 688*    Basename 02/11/11 1319  HGBA1C 8.0*   Micro Results: No results found for this or any previous visit (from the past 240 hour(s)).  Studies/Results: Chest Portable 1 View Post Insertion To Confirm Placement As Interpreted By Radiologist  01/30/2011  **ADDENDUM** CREATED: 01/30/2011 12:39:49  PICC line should be retracted 4 cm to allow the tip to  reside in the superior vena cava.  **END ADDENDUM** SIGNED BY: Lajean Manes, M.D.    01/30/2011  *RADIOLOGY REPORT*  Clinical Data: Left PICC placement  PORTABLE CHEST - 1 VIEW  Comparison: 01/27/2011  Findings: New left PICC has its tip in the right atrium.  No pneumothorax.  The heart, mediastinum and hila are unremarkable.  The lungs are clear.  IMPRESSION: New left PICC has its tip in the right atrium.  No acute cardiopulmonary disease.  Original Report Authenticated By: Lasandra Beech, M.D.   Dg Chest Portable 1 View  01/27/2011  *RADIOLOGY REPORT*  Clinical Data: Shortness of breath.  PORTABLE CHEST - 1 VIEW  Comparison: Chest 01/27/2011.  Findings: Lungs are clear.  Heart size is normal.  No pneumothorax or pleural effusion.  IMPRESSION: No acute disease.  Original Report Authenticated By: Arvid Right. D'ALESSIO, M.D.   Dg Chest Portable 1 View  01/27/2011  *RADIOLOGY REPORT*  Clinical Data: Left lower extremity pain, no pulses, hypertension  PORTABLE CHEST - 1 VIEW  Comparison: Portable exam 1029 hours compared to 03/21/2010  Findings: Normal  heart size, mediastinal contours, and pulmonary vascularity. Mild peribronchial thickening. No pulmonary infiltrate, pleural effusion, or pneumothorax. Bones unremarkable.  IMPRESSION: Minimal chronic bronchitic changes.  Original Report Authenticated By: Burnetta Sabin, M.D.   Dg Ang/ext/uni/or Left  01/29/2011  *RADIOLOGY REPORT*  Clinical Data: Left popliteal embolectomy.  LEFT ANG/EXT/UNI/ OR  Comparison: None.  Findings: The distal popliteal artery is patent.  There is flow in the anterior tibial artery, posterior tibial artery and peroneal artery.  Limited evaluation of the distal runoff vessels.  There appears to be flow in the dorsalis pedis artery.  IMPRESSION: Patent distal popliteal artery and runoff vessels as described.  Original Report Authenticated By: Markus Daft, M.D.   Medications: Scheduled Meds:    . insulin aspart  0-15 Units Subcutaneous  TID WC  . insulin glargine  25 Units Subcutaneous QHS  . LORazepam  0.5 mg Intravenous Once  . losartan  12.5 mg Oral Daily  . multivitamins ther. w/minerals  1 tablet Oral Daily  . polyethylene glycol  17 g Oral Daily  . pregabalin  50 mg Oral BID  . sertraline  50 mg Oral Daily  . vancomycin  750 mg Intravenous Q12H  . warfarin  3 mg Oral q1800  . warfarin  5 mg Oral ONCE-1800  . DISCONTD: insulin glargine  30 Units Subcutaneous QHS  . DISCONTD: warfarin  3 mg Oral q1800   Continuous Infusions:    . DISCONTD: sodium chloride 125 mL/hr at 02/12/11 0754   PRN Meds:.acetaminophen, acetaminophen, ALPRAZolam, diphenhydrAMINE, HYDROmorphone, ondansetron (ZOFRAN) IV, ondansetron, oxyCODONE  ASSESSMENT & PLAN: Active Problems:  Cellulitis and abscess of face  Diabetes mellitus  Peripheral vascular disease in diabetes mellitus  PVD (peripheral vascular disease)  Cellulitis of left foot  Cellulitis of the left foot Patient was on Keflex. That was discontinued and started on vancomycin. Probably can DC the morning with oral antibiotics. The cellulitis standpoint patient is appropriate for discharge, but Patient had significant hypoglycemic episode this morning.  Left first, fourth and fifth ischemic toes  Patient does have peripheral vascular disease likely secondary to her diabetes. His recent thrombosis of the superficial femoral artery status post thrombectomy and exploration of the left popliteal vein. Patient had angiogram showed patent vessels. Except her left dorsalis pedis showed in the ABI to be moderate to severe flow reduction. Dr. Sharol Given evaluated the patient and recommended to treat the cellulitis and refer the patient to him as outpatient for possible forefoot amputation.  Diabetes mellitus/hypoglycemia Insulin-dependent, on carbohydrate modified diet. Hemoglobin A1c is 8.0 better control since March of 2012 was 11.0. Patient did have low blood sugar. Patient said she is eating  more at home. Lantus dose was decreased to 25 units, but patient still had hypoglycemic episode on further to 20 units each bedtime.  Facial cellulitis Improved very much and almost resolved. Patient is on vancomycin  Depression/anxiety Continue current home medications.  Disposition Home in the morning.   LOS: 2 days   Trevel Dillenbeck A 02/13/2011, 9:45 AM

## 2011-02-14 LAB — GLUCOSE, CAPILLARY
Glucose-Capillary: 100 mg/dL — ABNORMAL HIGH (ref 70–99)
Glucose-Capillary: 48 mg/dL — ABNORMAL LOW (ref 70–99)
Glucose-Capillary: 82 mg/dL (ref 70–99)
Glucose-Capillary: 85 mg/dL (ref 70–99)

## 2011-02-14 LAB — BASIC METABOLIC PANEL WITH GFR
BUN: 13 mg/dL (ref 6–23)
CO2: 27 meq/L (ref 19–32)
Calcium: 7.9 mg/dL — ABNORMAL LOW (ref 8.4–10.5)
Chloride: 109 meq/L (ref 96–112)
Creatinine, Ser: 0.86 mg/dL (ref 0.50–1.10)
GFR calc Af Amer: >90 mL/min
GFR calc non Af Amer: 83 mL/min — ABNORMAL LOW
Glucose, Bld: 43 mg/dL — CL (ref 70–99)
Potassium: 3.5 meq/L (ref 3.5–5.1)
Sodium: 139 meq/L (ref 135–145)

## 2011-02-14 LAB — PROTIME-INR
INR: 1.69 — ABNORMAL HIGH (ref 0.00–1.49)
Prothrombin Time: 20.2 seconds — ABNORMAL HIGH (ref 11.6–15.2)

## 2011-02-14 MED ORDER — INSULIN ASPART 100 UNIT/ML ~~LOC~~ SOLN: 5.0000 [IU] | Freq: Three times a day (TID) | SUBCUTANEOUS | Status: DC

## 2011-02-14 MED ORDER — WARFARIN SODIUM 3 MG PO TABS: 3.0000 mg | ORAL_TABLET | Freq: Every day | ORAL | Status: DC

## 2011-02-14 MED ORDER — WARFARIN SODIUM 5 MG PO TABS
5.0000 mg | ORAL_TABLET | Freq: Once | ORAL | Status: DC
  Filled 2011-02-14: qty 1

## 2011-02-14 MED ORDER — INSULIN GLARGINE 100 UNIT/ML ~~LOC~~ SOLN: 15.0000 [IU] | Freq: Every day | SUBCUTANEOUS | Status: DC

## 2011-02-14 MED ORDER — OXYCODONE HCL 10 MG PO TABS: 10.0000 mg | ORAL_TABLET | Freq: Four times a day (QID) | ORAL | Status: DC | PRN

## 2011-02-14 MED ORDER — DEXTROSE 50 % IV SOLN
INTRAVENOUS | Status: AC
  Administered 2011-02-14: 25 mL
  Filled 2011-02-14: qty 50

## 2011-02-14 MED ORDER — CLINDAMYCIN HCL 300 MG PO CAPS: 300.0000 mg | ORAL_CAPSULE | Freq: Three times a day (TID) | ORAL | Status: AC

## 2011-02-14 NOTE — Progress Notes (Signed)
ANTIBIOTIC CONSULT NOTE - INITIAL  Pharmacy Consult for coumadin Indication: PVD with thrombosis  Allergies  Allergen Reactions  . Aspirin     Cannot take this medication due to kidney disease  . Ibuprofen     Kidney    Patient Measurements: Height: 5\' 1"  (154.9 cm) Weight: 135 lb 12.8 oz (61.598 kg) IBW/kg (Calculated) : 47.8   Vital Signs: Temp: 98 F (36.7 C) (01/27 0500) Temp src: Oral (01/27 0500) BP: 132/74 mmHg (01/27 0500) Pulse Rate: 80  (01/27 0500) Intake/Output from previous day: 01/26 0701 - 01/27 0700 In: 1579.8 [P.O.:1200; I.V.:229.8; IV Piggyback:150] Out: -  Intake/Output from this shift:    Labs:  Basename 02/14/11 0545 02/13/11 0700 02/12/11 0542  WBC -- -- 12.7*  HGB -- -- 9.9*  PLT -- -- 663*  LABCREA -- -- --  CREATININE 0.86 0.71 0.87   Estimated Creatinine Clearance: 72.4 ml/min (by C-G formula based on Cr of 0.86). Microbiology: Recent Results (from the past 720 hour(s))  MRSA PCR SCREENING     Status: Normal   Collection Time   01/27/11  6:11 PM      Component Value Range Status Comment   MRSA by PCR NEGATIVE  NEGATIVE  Final     Medical History: Past Medical History  Diagnosis Date  . Shingles     11/2010  . Renal disorder     glomerulonephritis  . Cellulitis     of face - 12/2010  . Peripheral vascular disease in diabetes mellitus   . Hyperlipidemia   . Tobacco abuse     0.5 - 1ppd x 21 yrs  . Headache   . Diabetes mellitus     Since Age 21    Medications:  Prescriptions prior to admission  Medication Sig Dispense Refill  . acetaminophen (TYLENOL) 500 MG tablet Take 1,000 mg by mouth every 6 (six) hours as needed. For pain       . ALPRAZolam (XANAX) 1 MG tablet Take 1 mg by mouth daily as needed. Anxiety      . cephALEXin (KEFLEX) 500 MG capsule Take 1 capsule (500 mg total) by mouth 4 (four) times daily.  56 capsule  0  . diphenhydrAMINE (BENADRYL) 25 MG tablet Take 25 mg by mouth at bedtime as needed. For sleep        . fish oil-omega-3 fatty acids 1000 MG capsule Take 1 g by mouth daily.      . furosemide (LASIX) 20 MG tablet Take 20 mg by mouth 3 (three) times a week. On Mondays, Wednesdays, and Fridays       . HYDROcodone-acetaminophen (VICODIN) 5-500 MG per tablet Take 1 tablet by mouth every 4 (four) hours as needed. Pain      . HYDROmorphone (DILAUDID) 2 MG tablet Take 2-4 mg by mouth every 4 (four) hours as needed.      . insulin aspart (NOVOLOG) 100 UNIT/ML injection Inject 10 Units into the skin 3 (three) times daily before meals.  1 vial  12  . insulin glargine (LANTUS) 100 UNIT/ML injection Inject 30 Units into the skin at bedtime.  10 mL  12  . losartan (COZAAR) 25 MG tablet Take 12.5 mg by mouth daily.      . Multiple Vitamins-Minerals (MULTIVITAMINS THER. W/MINERALS) TABS Take 1 tablet by mouth daily.        . potassium chloride SA (K-DUR,KLOR-CON) 20 MEQ tablet Take 0.5 tablets (10 mEq total) by mouth daily.  7 tablet  0  .  pregabalin (LYRICA) 50 MG capsule Take 1 capsule (50 mg total) by mouth 2 (two) times daily.  60 capsule  1  . sertraline (ZOLOFT) 50 MG tablet Take 50 mg by mouth daily.        Marland Kitchen warfarin (COUMADIN) 1 MG tablet Take 3 mg by mouth daily. Takes at 6pm       Scheduled:     . dextrose      . insulin aspart  0-15 Units Subcutaneous TID WC  . insulin glargine  20 Units Subcutaneous QHS  . LORazepam  0.5 mg Intravenous Once  . losartan  12.5 mg Oral Daily  . multivitamins ther. w/minerals  1 tablet Oral Daily  . polyethylene glycol  17 g Oral Daily  . pregabalin  50 mg Oral BID  . sertraline  50 mg Oral Daily  . vancomycin  750 mg Intravenous Q12H  . warfarin  3 mg Oral q1800  . warfarin  5 mg Oral ONCE-1800  . white petrolatum      . DISCONTD: insulin glargine  25 Units Subcutaneous QHS   Assessment: 41 yo female with left Foot Cellulitis and Ischemic 4th and 5th left toes to start vancomycin. Plan to change to PO abx soon. Pt is also coumadin for leg thrombosis. INR  1.69 today.    Goal of Therapy:  Vancomycin trough level 10-15 mcg/ml  Plan:  -Cont vanc 750mg  IV q12 for now -Coumadin 5mg  x1 today then 3mg  qday  Onnie Boer Albert 02/14/2011,9:33 AM

## 2011-02-14 NOTE — Discharge Summary (Signed)
HOSPITAL DISCHARGE SUMMARY  [name]  MRN: [id number]  DOB:10/06/1970  Date of Admission: 02/11/2011 Date of Discharge: 02/14/2011         LOS: 3 days   Attending Physician:Marck Mcclenny A  Patient's KN:[id number] L, MD, MD  Consults: Meridee Score orthopedic surgery Tinnie Gens vascular surgery  Discharge Diagnoses: Present on Admission:  .Cellulitis of left foot .Cellulitis and abscess of face .Diabetes mellitus .Peripheral vascular disease in diabetes mellitus .PVD (peripheral vascular disease)   Medication List  As of 02/14/2011 11:35 AM   STOP taking these medications         cephALEXin 500 MG capsule      HYDROcodone-acetaminophen 5-500 MG per tablet      HYDROmorphone 2 MG tablet         TAKE these medications         acetaminophen 500 MG tablet   Commonly known as: TYLENOL   Take 1,000 mg by mouth every 6 (six) hours as needed. For pain      ALPRAZolam 1 MG tablet   Commonly known as: XANAX   Take 1 mg by mouth daily as needed. Anxiety      clindamycin 300 MG capsule   Commonly known as: CLEOCIN   Take 1 capsule (300 mg total) by mouth 3 (three) times daily.      diphenhydrAMINE 25 MG tablet   Commonly known as: BENADRYL   Take 25 mg by mouth at bedtime as needed. For sleep      fish oil-omega-3 fatty acids 1000 MG capsule   Take 1 g by mouth daily.      furosemide 20 MG tablet   Commonly known as: LASIX   Take 20 mg by mouth 3 (three) times a week. On Mondays, Wednesdays, and Fridays      insulin aspart 100 UNIT/ML injection   Commonly known as: novoLOG   Inject 5 Units into the skin 3 (three) times daily before meals.      insulin glargine 100 UNIT/ML injection   Commonly known as: LANTUS   Inject 15 Units into the skin at bedtime.      losartan 25 MG tablet   Commonly known as: COZAAR   Take 12.5 mg by mouth daily.      multivitamins ther. w/minerals Tabs   Take 1 tablet by mouth daily.      Oxycodone HCl 10 MG Tabs   Take  1 tablet (10 mg total) by mouth every 6 (six) hours as needed (Pain).      potassium chloride SA 20 MEQ tablet   Commonly known as: K-DUR,KLOR-CON   Take 0.5 tablets (10 mEq total) by mouth daily.      pregabalin 50 MG capsule   Commonly known as: LYRICA   Take 1 capsule (50 mg total) by mouth 2 (two) times daily.      sertraline 50 MG tablet   Commonly known as: ZOLOFT   Take 50 mg by mouth daily.      warfarin 1 MG tablet   Commonly known as: COUMADIN   Take 3 mg by mouth daily. Takes at 6pm             Brief Admission History: [name] is a 41 y.o. female with history of diabetes mellitus type 1. Patient came in to the hospital complaining about worsening left foot pain and redness. Patient was recently discharged from [facility] after she was admitted for ischemic left leg. Patient did have  multiple angiograms and thrombectomy after she did not respond to thrombolysis. After discharge patient saw Dr. Kellie Simmering at his office and she was thought to have left foot cellulitis. Started on Keflex but did not help. Patient did have worsening pain and redness as well as swelling. Presented today to the emergency department and she was referred for admission.  Hospital Course: Present on Admission:  .Cellulitis of left foot .Cellulitis and abscess of face .Diabetes mellitus .Peripheral vascular disease in diabetes mellitus .PVD (peripheral vascular disease)  1. Left lower extremity cellulitis: As mentioned above the patient returned to the hospital because of redness, swelling and worsening pain and tenderness in her left foot. Evaluated initially in [facility] and referred to [facility] because of her vascular surgeon is here. Patient started on IV vancomycin for her cellulitis and it's improved in the next day. Vascular surgery and orthopedics recommended to continue the antibiotics and followup with orthopedic surgery as outpatient for possible transmetatarsal  amputation versus separate toes amputation. Patient discharged on clindamycin for 5 more days  2. PVD and diabetes : Patient was recently in the hospital for left superficial femoral artery thrombosis treated with thrombectomy. Patient does have moderate to severe reduced flow in the left dorsalis pedis. She has ischemic left first, fourth and fifth toes. It's dry gangrene secondary to PVD. The cellulitis is on the dorsum of the foot and it's not in the toes. Dr. Kellie Simmering from vascular surgery and recommended no further surgical intervention. Patient to followup with Dr. Sharol Given as outpatient.  3. DM 1: Uncontrolled with complications, patient reported that she is on 30 units of Lantus at night. That was started in the hospital the patient continues to have persistent hypoglycemia especially in the morning. The Lantus dose was decreased to 20 and patient was still having low blood sugar as low as 45. At the time of discharge but decided to admit the patient to take 15 units of Lantus at night and 5 units of NovoLog insulin with meals. Patient instructed to check her fasting blood sugar and report that to her primary care physician. For further adjustment of the Lantus insulin.   4. Hypoglycemia and type 1 diabetes: Please see above. Lantus decreased to 15 units and NovoLog to 5 units with meals.  5. Pain, in her left lower extremity: This is secondary to ischemia, but it also might be diabetic neuropathy. The patient was continued on her neuropathy medications, patient discharged on oral oxycodone. She said that Vicodin did not control the pain, at one point she was on oral Dilaudid for her pain. Followup with primary care physician.   Day of Discharge BP 132/74  Pulse 80  Temp(Src) 98 F (36.7 C) (Oral)  Resp 18  Ht 5\' 1"  (1.549 m)  Wt 61.598 kg (135 lb 12.8 oz)  BMI 25.66 kg/m2  SpO2 97%  LMP 01/11/2011 Physical Exam: GEN: No acute distress, cooperative with exam PSYCH: He is alert and oriented  x4; does not appear anxious does not appear depressed; affect is normal  HEENT: Mucous membranes pink and anicteric;  Mouth: without oral thrush or lesions Eyes: PERRLA; EOM intact;  Neck: no cervical lymphadenopathy nor thyromegaly or carotid bruit; [name];  CHEST WALL: No tenderness, symmetrical to breathing bilaterally CHEST: Normal respiration, clear to auscultation bilaterally  HEART: Regular rate and rhythm; no murmurs, rubs or gallops, S1 and S2 heard  BACK: No kyphosis or scoliosis; no CVA tenderness  ABDOMEN:  soft non-tender; no masses, no  organomegaly, normal abdominal bowel sounds; no pannus; no intertriginous candida.  EXTREMITIES: No bone or joint deformity; no edema; no ulcerations.  PULSES: 2+ and symmetric, neurovascularity is intact SKIN: Normal hydration no rash or ulceration, no flushing or suspicious lesions  CNS: Cranial nerves 2-12 grossly intact no focal neurologic deficit, coordination is intact gait not tested    Results for orders placed during the hospital encounter of 02/11/11 (from the past 24 hour(s))  GLUCOSE, CAPILLARY     Status: Abnormal   Collection Time   02/13/11 11:52 AM      Component Value Range   Glucose-Capillary 123 (*) 70 - 99 (mg/dL)  GLUCOSE, CAPILLARY     Status: Abnormal   Collection Time   02/13/11  4:49 PM      Component Value Range   Glucose-Capillary 60 (*) 70 - 99 (mg/dL)  GLUCOSE, CAPILLARY     Status: Normal   Collection Time   02/13/11  6:09 PM      Component Value Range   Glucose-Capillary 75  70 - 99 (mg/dL)  GLUCOSE, CAPILLARY     Status: Abnormal   Collection Time   02/13/11 10:23 PM      Component Value Range   Glucose-Capillary 112 (*) 70 - 99 (mg/dL)  PROTIME-INR     Status: Abnormal   Collection Time   02/14/11  5:45 AM      Component Value Range   Prothrombin Time 20.2 (*) 11.6 - 15.2 (seconds)   INR 1.69 (*) 0.00 - [id number]   BASIC METABOLIC PANEL     Status: Abnormal   Collection Time   02/14/11  5:45 AM       Component Value Range   Sodium 139  135 - 145 (mEq/L)   Potassium 3.5  3.5 - 5.1 (mEq/L)   Chloride 109  96 - 112 (mEq/L)   CO2 27  19 - 32 (mEq/L)   Glucose, Bld 43 (*) 70 - 99 (mg/dL)   BUN 13  6 - 23 (mg/dL)   Creatinine, Ser 0.86  0.50 - 1.10 (mg/dL)   Calcium 7.9 (*) 8.4 - 10.5 (mg/dL)   GFR calc non Af Amer 83 (*) >90 (mL/min)   GFR calc Af Amer >90  >90 (mL/min)  GLUCOSE, CAPILLARY     Status: Abnormal   Collection Time   02/14/11  6:44 AM      Component Value Range   Glucose-Capillary 48 (*) 70 - 99 (mg/dL)   Comment 1 Notify RN    GLUCOSE, CAPILLARY     Status: Abnormal   Collection Time   02/14/11  7:07 AM      Component Value Range   Glucose-Capillary 100 (*) 70 - 99 (mg/dL)   Comment 1 Notify RN    GLUCOSE, CAPILLARY     Status: Normal   Collection Time   02/14/11  7:50 AM      Component Value Range   Glucose-Capillary 82  70 - 99 (mg/dL)    Disposition: Home   Follow-up Appts: Discharge Orders    Future Appointments: Provider: Department: Dept Phone: Center:   03/02/2011 1:15 PM Mal Misty, MD [facility] [phone] [facility]     Future Orders Please Complete By Expires   Diet Carb Modified      Increase activity slowly         Follow-up Information    Schedule an appointment as soon as possible for a visit with Newt Minion, MD.   Contact information:  [place] [phone]       Follow up with HAWKINS,EDWARD L, MD. Schedule an appointment as soon as possible for a visit in 1 week.   Contact information:   [address] [place] [phone]          I spent 40 minutes completing paperwork and coordinating discharge efforts.  SignedVerlee Monte A 02/14/2011, 11:35 AM

## 2011-02-14 NOTE — Progress Notes (Signed)
Silvestre Gunner discharged Home per MD order.  Discharge instructions reviewed and discussed with the patient, all questions and concerns answered. Copy of instructions and scripts given to patient.   Ayrianna, [place]dermitt  Home Medication Instructions [id number]   Printed on:02/14/11 1234  Medication Information                    ALPRAZolam (XANAX) 1 MG tablet Take 1 mg by mouth daily as needed. Anxiety           sertraline (ZOLOFT) 50 MG tablet Take 50 mg by mouth daily.             potassium chloride SA (K-DUR,KLOR-CON) 20 MEQ tablet Take 0.5 tablets (10 mEq total) by mouth daily.           furosemide (LASIX) 20 MG tablet Take 20 mg by mouth 3 (three) times a week. On Mondays, Wednesdays, and Fridays            acetaminophen (TYLENOL) 500 MG tablet Take 1,000 mg by mouth every 6 (six) hours as needed. For pain            diphenhydrAMINE (BENADRYL) 25 MG tablet Take 25 mg by mouth at bedtime as needed. For sleep           Multiple Vitamins-Minerals (MULTIVITAMINS THER. W/MINERALS) TABS Take 1 tablet by mouth daily.             pregabalin (LYRICA) 50 MG capsule Take 1 capsule (50 mg total) by mouth 2 (two) times daily.           fish oil-omega-3 fatty acids 1000 MG capsule Take 1 g by mouth daily.           losartan (COZAAR) 25 MG tablet Take 12.5 mg by mouth daily.           warfarin (COUMADIN) 1 MG tablet Take 3 mg by mouth daily. Takes at 6pm           insulin aspart (NOVOLOG) 100 UNIT/ML injection Inject 5 Units into the skin 3 (three) times daily before meals.           insulin glargine (LANTUS) 100 UNIT/ML injection Inject 15 Units into the skin at bedtime.           clindamycin (CLEOCIN) 300 MG capsule Take 1 capsule (300 mg total) by mouth 3 (three) times daily.           Oxycodone HCl 10 MG TABS Take 1 tablet (10 mg total) by mouth every 6 (six) hours as needed (Pain).             Patients skin is clean, dry and intact. Pt has necrosis to 1st, 4th and 5th  toe of Lt foot and is to follow up with Dr. Sharol Given and Dr. Luan Pulling. IV site discontinued and catheter remains intact. Site without signs and symptoms of complications. Dressing and pressure applied.  Patient escorted to car by NT in a wheelchair,  no distress noted upon discharge.  Darreld Mclean [facility] 02/14/2011 01:15 PM

## 2011-03-01 ENCOUNTER — Encounter: Payer: Self-pay | Admitting: Vascular Surgery

## 2011-03-02 ENCOUNTER — Encounter (HOSPITAL_COMMUNITY): Payer: Self-pay | Admitting: Pharmacy Technician

## 2011-03-02 ENCOUNTER — Ambulatory Visit: Payer: Medicaid Other | Admitting: Vascular Surgery

## 2011-03-02 ENCOUNTER — Ambulatory Visit (INDEPENDENT_AMBULATORY_CARE_PROVIDER_SITE_OTHER): Payer: Medicaid Other | Admitting: Vascular Surgery

## 2011-03-02 ENCOUNTER — Other Ambulatory Visit (HOSPITAL_COMMUNITY): Payer: Self-pay | Admitting: Orthopedic Surgery

## 2011-03-02 ENCOUNTER — Encounter (HOSPITAL_COMMUNITY): Payer: Self-pay | Admitting: *Deleted

## 2011-03-02 ENCOUNTER — Other Ambulatory Visit (HOSPITAL_COMMUNITY): Payer: Self-pay | Admitting: *Deleted

## 2011-03-02 ENCOUNTER — Encounter: Payer: Self-pay | Admitting: Vascular Surgery

## 2011-03-02 VITALS — BP 146/85 | HR 116 | Resp 20 | Ht 61.0 in | Wt 138.0 lb

## 2011-03-02 DIAGNOSIS — M629 Disorder of muscle, unspecified: Secondary | ICD-10-CM

## 2011-03-02 DIAGNOSIS — I96 Gangrene, not elsewhere classified: Secondary | ICD-10-CM | POA: Insufficient documentation

## 2011-03-02 NOTE — Progress Notes (Signed)
Subjective:     Patient ID: [name], female   DOB: Jun 08, 1970, 41 y.o.   MRN: [id number]  HPI this 41 year old female returns for further followup regarding her ischemic left foot. She has dry gangrene of the fourth and fifth toes which is followed by Dr. Sharol Given. She remains on Coumadin therapy. 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 She continues to have pain, hypersensitivity, burning discomfort in the left foot with occasional numbness. He is been working on dorsiflexion of her left foot with exercises.   Review of Systems     Objective:   Physical ExamBP 146/85  Pulse 116  Resp 20  Ht 5\' 1"  (1.549 m)  Wt 138 lb (62.596 kg)  BMI 26.07 kg/m2  LMP 01/11/2011 General well-developed well-nourished female complaining of intermittent discomfort in the left foot Left leg incisions well healed 3+ posterior tibial pulse left foot palpable Dry gangrene involving the distal half of left fourth and fifth toe. There is some areas of gangrenous scan on plantar surface left first toe which is not extensive. No evidence of cellulitis.    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 Sharol Given today regarding fourth and fifth toe amputations Given prescription for hydrocodone 7.5 over 500 #30 tablets She will get her pain medication from Dr. Luan Pulling following this prescription Return to see me in 3 months

## 2011-03-03 ENCOUNTER — Ambulatory Visit (HOSPITAL_COMMUNITY)
Admission: RE | Admit: 2011-03-03 | Discharge: 2011-03-04 | DRG: 256 | Disposition: A | Discharge status: Home / Self Care | Payer: Medicaid Other | Source: Ambulatory Visit | Attending: Orthopedic Surgery | Admitting: Orthopedic Surgery

## 2011-03-03 ENCOUNTER — Ambulatory Visit (HOSPITAL_COMMUNITY): Payer: Medicaid Other | Admitting: Anesthesiology

## 2011-03-03 ENCOUNTER — Encounter (HOSPITAL_COMMUNITY): Payer: Self-pay | Admitting: *Deleted

## 2011-03-03 ENCOUNTER — Encounter (HOSPITAL_COMMUNITY): Payer: Self-pay | Admitting: Anesthesiology

## 2011-03-03 ENCOUNTER — Encounter (HOSPITAL_COMMUNITY)
Admission: RE | Disposition: A | Discharge status: Home / Self Care | Payer: Self-pay | Source: Ambulatory Visit | Attending: Orthopedic Surgery

## 2011-03-03 DIAGNOSIS — E785 Hyperlipidemia, unspecified: Secondary | ICD-10-CM | POA: Diagnosis present

## 2011-03-03 DIAGNOSIS — Z7901 Long term (current) use of anticoagulants: Secondary | ICD-10-CM

## 2011-03-03 DIAGNOSIS — Z886 Allergy status to analgesic agent status: Secondary | ICD-10-CM

## 2011-03-03 DIAGNOSIS — F411 Generalized anxiety disorder: Secondary | ICD-10-CM | POA: Diagnosis present

## 2011-03-03 DIAGNOSIS — Z86718 Personal history of other venous thrombosis and embolism: Secondary | ICD-10-CM

## 2011-03-03 DIAGNOSIS — I96 Gangrene, not elsewhere classified: Secondary | ICD-10-CM | POA: Diagnosis present

## 2011-03-03 DIAGNOSIS — F3289 Other specified depressive episodes: Secondary | ICD-10-CM | POA: Diagnosis present

## 2011-03-03 DIAGNOSIS — E1151 Type 2 diabetes mellitus with diabetic peripheral angiopathy without gangrene: Secondary | ICD-10-CM

## 2011-03-03 DIAGNOSIS — Z79899 Other long term (current) drug therapy: Secondary | ICD-10-CM

## 2011-03-03 DIAGNOSIS — Z794 Long term (current) use of insulin: Secondary | ICD-10-CM

## 2011-03-03 DIAGNOSIS — F329 Major depressive disorder, single episode, unspecified: Secondary | ICD-10-CM | POA: Diagnosis present

## 2011-03-03 DIAGNOSIS — F17200 Nicotine dependence, unspecified, uncomplicated: Secondary | ICD-10-CM | POA: Diagnosis present

## 2011-03-03 DIAGNOSIS — I739 Peripheral vascular disease, unspecified: Secondary | ICD-10-CM | POA: Diagnosis present

## 2011-03-03 DIAGNOSIS — E1159 Type 2 diabetes mellitus with other circulatory complications: Secondary | ICD-10-CM | POA: Diagnosis present

## 2011-03-03 HISTORY — DX: Depression, unspecified: F32.A

## 2011-03-03 HISTORY — DX: Anxiety disorder, unspecified: F41.9

## 2011-03-03 HISTORY — PX: AMPUTATION: SHX166

## 2011-03-03 HISTORY — DX: Major depressive disorder, single episode, unspecified: F32.9

## 2011-03-03 LAB — CBC
HCT: 35 % — ABNORMAL LOW (ref 36.0–46.0)
Hemoglobin: 12.1 g/dL (ref 12.0–15.0)
MCH: 30.8 pg (ref 26.0–34.0)
MCHC: 34.6 g/dL (ref 30.0–36.0)
MCV: 89.1 fL (ref 78.0–100.0)
Platelets: 519 10*3/uL — ABNORMAL HIGH (ref 150–400)
RBC: 3.93 MIL/uL (ref 3.87–5.11)
RDW: 14.7 % (ref 11.5–15.5)
WBC: 10.1 10*3/uL (ref 4.0–10.5)

## 2011-03-03 LAB — HCG, SERUM, QUALITATIVE: Preg, Serum: NEGATIVE

## 2011-03-03 LAB — COMPREHENSIVE METABOLIC PANEL
ALT: 10 U/L (ref 0–35)
AST: 13 U/L (ref 0–37)
Albumin: 1 g/dL — ABNORMAL LOW (ref 3.5–5.2)
Alkaline Phosphatase: 89 U/L (ref 39–117)
BUN: 11 mg/dL (ref 6–23)
CO2: 27 mEq/L (ref 19–32)
Calcium: 8.2 mg/dL — ABNORMAL LOW (ref 8.4–10.5)
Chloride: 101 mEq/L (ref 96–112)
Creatinine, Ser: 0.77 mg/dL (ref 0.50–1.10)
GFR calc Af Amer: >90 mL/min (ref >90)
GFR calc non Af Amer: >90 mL/min (ref >90)
Glucose, Bld: 179 mg/dL — ABNORMAL HIGH (ref 70–99)
Potassium: 3.2 mEq/L — ABNORMAL LOW (ref 3.5–5.1)
Sodium: 136 mEq/L (ref 135–145)
Total Bilirubin: 0.1 mg/dL — ABNORMAL LOW (ref 0.3–1.2)
Total Protein: 5 g/dL — ABNORMAL LOW (ref 6.0–8.3)

## 2011-03-03 LAB — GLUCOSE, CAPILLARY
Glucose-Capillary: 159 mg/dL — ABNORMAL HIGH (ref 70–99)
Glucose-Capillary: 225 mg/dL — ABNORMAL HIGH (ref 70–99)
Glucose-Capillary: 197 mg/dL — ABNORMAL HIGH (ref 70–99)
Glucose-Capillary: 284 mg/dL — ABNORMAL HIGH (ref 70–99)
Glucose-Capillary: 147 mg/dL — ABNORMAL HIGH (ref 70–99)

## 2011-03-03 LAB — PROTIME-INR
INR: 1.22 (ref 0.00–1.49)
Prothrombin Time: 15.7 seconds — ABNORMAL HIGH (ref 11.6–15.2)

## 2011-03-03 LAB — SURGICAL PCR SCREEN
MRSA, PCR: NEGATIVE
Staphylococcus aureus: NEGATIVE

## 2011-03-03 LAB — APTT: aPTT: 28 seconds (ref 24–37)

## 2011-03-03 SURGERY — AMPUTATION DIGIT
Anesthesia: General | Site: Foot | Laterality: Left | Wound class: Dirty or Infected

## 2011-03-03 MED ORDER — HYDROMORPHONE HCL PF 1 MG/ML IJ SOLN
INTRAMUSCULAR | Status: AC
  Administered 2011-03-03: 0.5 mg via INTRAVENOUS
  Filled 2011-03-03: qty 1

## 2011-03-03 MED ORDER — ALPRAZOLAM 0.5 MG PO TABS: 1.0000 mg | ORAL_TABLET | Freq: Every day | ORAL | Status: DC | PRN

## 2011-03-03 MED ORDER — HYDROMORPHONE HCL PF 1 MG/ML IJ SOLN
INTRAMUSCULAR | Status: AC
  Filled 2011-03-03: qty 1

## 2011-03-03 MED ORDER — SODIUM CHLORIDE 0.9 % IV SOLN
INTRAVENOUS | Status: DC
  Administered 2011-03-03: 17:00:00 via INTRAVENOUS

## 2011-03-03 MED ORDER — HYDROMORPHONE HCL PF 1 MG/ML IJ SOLN
0.5000 mg | INTRAMUSCULAR | Status: DC | PRN
  Administered 2011-03-03 – 2011-03-04 (×3): 1 mg via INTRAVENOUS
  Filled 2011-03-03 (×3): qty 1

## 2011-03-03 MED ORDER — FENTANYL CITRATE 0.05 MG/ML IJ SOLN
50.0000 ug | INTRAMUSCULAR | Status: DC | PRN
  Administered 2011-03-03: 100 ug via INTRAVENOUS

## 2011-03-03 MED ORDER — ONDANSETRON HCL 4 MG/2ML IJ SOLN
4.0000 mg | Freq: Once | INTRAMUSCULAR | Status: AC | PRN
  Administered 2011-03-03: 4 mg via INTRAVENOUS

## 2011-03-03 MED ORDER — FENTANYL CITRATE 0.05 MG/ML IJ SOLN
INTRAMUSCULAR | Status: DC | PRN
  Administered 2011-03-03 (×2): 50 ug via INTRAVENOUS

## 2011-03-03 MED ORDER — OXYCODONE-ACETAMINOPHEN 5-325 MG PO TABS: 1.0000 | ORAL_TABLET | ORAL | Status: DC | PRN

## 2011-03-03 MED ORDER — THERA M PLUS PO TABS
1.0000 | ORAL_TABLET | Freq: Every day | ORAL | Status: DC
  Filled 2011-03-03: qty 1

## 2011-03-03 MED ORDER — ZOLPIDEM TARTRATE 5 MG PO TABS: 5.0000 mg | ORAL_TABLET | Freq: Every evening | ORAL | Status: DC | PRN

## 2011-03-03 MED ORDER — MIDAZOLAM HCL 2 MG/2ML IJ SOLN
1.0000 mg | INTRAMUSCULAR | Status: DC | PRN
  Administered 2011-03-03: 1 mg via INTRAVENOUS

## 2011-03-03 MED ORDER — 0.9 % SODIUM CHLORIDE (POUR BTL) OPTIME
TOPICAL | Status: DC | PRN
  Administered 2011-03-03: 1000 mL

## 2011-03-03 MED ORDER — HYDROMORPHONE HCL PF 1 MG/ML IJ SOLN
0.2500 mg | INTRAMUSCULAR | Status: DC | PRN
  Administered 2011-03-03 (×4): 0.5 mg via INTRAVENOUS

## 2011-03-03 MED ORDER — CEFAZOLIN SODIUM 1-5 GM-% IV SOLN
1.0000 g | Freq: Four times a day (QID) | INTRAVENOUS | Status: AC
  Administered 2011-03-03 – 2011-03-04 (×3): 1 g via INTRAVENOUS
  Filled 2011-03-03 (×4): qty 50

## 2011-03-03 MED ORDER — LIDOCAINE HCL (PF) 2 % IJ SOLN
INTRAMUSCULAR | Status: DC | PRN
  Administered 2011-03-03: 40 mL

## 2011-03-03 MED ORDER — ONDANSETRON HCL 4 MG/2ML IJ SOLN
INTRAMUSCULAR | Status: AC
  Filled 2011-03-03: qty 2

## 2011-03-03 MED ORDER — OMEGA-3 FATTY ACIDS 1000 MG PO CAPS: 1.0000 g | ORAL_CAPSULE | Freq: Every day | ORAL | Status: DC

## 2011-03-03 MED ORDER — MIDAZOLAM HCL 2 MG/2ML IJ SOLN
INTRAMUSCULAR | Status: AC
  Filled 2011-03-03: qty 2

## 2011-03-03 MED ORDER — CEFAZOLIN SODIUM 1-5 GM-% IV SOLN
1.0000 g | INTRAVENOUS | Status: AC
  Administered 2011-03-03: 1 g via INTRAVENOUS

## 2011-03-03 MED ORDER — SODIUM CHLORIDE 0.9 % IV SOLN
INTRAVENOUS | Status: DC
  Administered 2011-03-03: 50 mL/h via INTRAVENOUS

## 2011-03-03 MED ORDER — PREGABALIN 50 MG PO CAPS
50.0000 mg | ORAL_CAPSULE | Freq: Two times a day (BID) | ORAL | Status: DC
  Administered 2011-03-03: 50 mg via ORAL
  Filled 2011-03-03: qty 1

## 2011-03-03 MED ORDER — DROPERIDOL 2.5 MG/ML IJ SOLN
INTRAMUSCULAR | Status: AC
  Administered 2011-03-03: 0.625 mg via INTRAVENOUS
  Filled 2011-03-03: qty 2

## 2011-03-03 MED ORDER — MUPIROCIN 2 % EX OINT
TOPICAL_OINTMENT | Freq: Two times a day (BID) | CUTANEOUS | Status: DC
  Administered 2011-03-03: 1 via NASAL
  Filled 2011-03-03: qty 22

## 2011-03-03 MED ORDER — METOCLOPRAMIDE HCL 5 MG/ML IJ SOLN
5.0000 mg | Freq: Three times a day (TID) | INTRAMUSCULAR | Status: DC | PRN
  Filled 2011-03-03: qty 2

## 2011-03-03 MED ORDER — INSULIN ASPART 100 UNIT/ML ~~LOC~~ SOLN
5.0000 [IU] | Freq: Three times a day (TID) | SUBCUTANEOUS | Status: DC
  Administered 2011-03-03: 5 [IU] via SUBCUTANEOUS
  Filled 2011-03-03: qty 3

## 2011-03-03 MED ORDER — FENTANYL CITRATE 0.05 MG/ML IJ SOLN
INTRAMUSCULAR | Status: AC
  Filled 2011-03-03: qty 2

## 2011-03-03 MED ORDER — HYDROCODONE-ACETAMINOPHEN 10-325 MG PO TABS
1.0000 | ORAL_TABLET | ORAL | Status: DC | PRN
  Administered 2011-03-03 – 2011-03-04 (×5): 2 via ORAL
  Filled 2011-03-03 (×5): qty 2

## 2011-03-03 MED ORDER — PROPOFOL 10 MG/ML IV EMUL
INTRAVENOUS | Status: DC | PRN
  Administered 2011-03-03: 120 ug/kg/min via INTRAVENOUS

## 2011-03-03 MED ORDER — ONDANSETRON HCL 4 MG/2ML IJ SOLN
4.0000 mg | Freq: Four times a day (QID) | INTRAMUSCULAR | Status: DC | PRN
  Administered 2011-03-03 – 2011-03-04 (×2): 4 mg via INTRAVENOUS
  Filled 2011-03-03 (×2): qty 2

## 2011-03-03 MED ORDER — OXYCODONE-ACETAMINOPHEN 7.5-325 MG PO TABS: 1.0000 | ORAL_TABLET | ORAL | Status: DC | PRN

## 2011-03-03 MED ORDER — SERTRALINE HCL 50 MG PO TABS
50.0000 mg | ORAL_TABLET | Freq: Every day | ORAL | Status: DC
  Administered 2011-03-03 – 2011-03-04 (×2): 50 mg via ORAL
  Filled 2011-03-03 (×2): qty 1

## 2011-03-03 MED ORDER — ONDANSETRON HCL 4 MG PO TABS: 4.0000 mg | ORAL_TABLET | Freq: Four times a day (QID) | ORAL | Status: DC | PRN

## 2011-03-03 MED ORDER — FUROSEMIDE 20 MG PO TABS
20.0000 mg | ORAL_TABLET | ORAL | Status: DC
Start: 2011-03-03 — End: 2011-03-04
  Filled 2011-03-03 (×2): qty 1

## 2011-03-03 MED ORDER — MUPIROCIN 2 % EX OINT
TOPICAL_OINTMENT | CUTANEOUS | Status: AC
  Administered 2011-03-03: 1 via NASAL
  Filled 2011-03-03: qty 22

## 2011-03-03 MED ORDER — CEFAZOLIN SODIUM 1-5 GM-% IV SOLN
INTRAVENOUS | Status: AC
  Filled 2011-03-03: qty 50

## 2011-03-03 MED ORDER — LOSARTAN POTASSIUM 25 MG PO TABS
12.5000 mg | ORAL_TABLET | Freq: Every day | ORAL | Status: DC
  Administered 2011-03-04: 12.5 mg via ORAL
  Filled 2011-03-03: qty 0.5

## 2011-03-03 MED ORDER — INSULIN GLARGINE 100 UNIT/ML ~~LOC~~ SOLN
15.0000 [IU] | Freq: Every day | SUBCUTANEOUS | Status: DC
  Administered 2011-03-03: 15 [IU] via SUBCUTANEOUS
  Filled 2011-03-03: qty 3

## 2011-03-03 MED ORDER — DROPERIDOL 2.5 MG/ML IJ SOLN
0.6250 mg | Freq: Once | INTRAMUSCULAR | Status: AC
  Administered 2011-03-03: 0.625 mg via INTRAVENOUS

## 2011-03-03 MED ORDER — OMEGA-3-ACID ETHYL ESTERS 1 G PO CAPS
1.0000 g | ORAL_CAPSULE | Freq: Every day | ORAL | Status: DC
  Administered 2011-03-04: 1 g via ORAL
  Filled 2011-03-03: qty 1

## 2011-03-03 MED ORDER — WARFARIN SODIUM 5 MG PO TABS
5.0000 mg | ORAL_TABLET | Freq: Once | ORAL | Status: AC
  Administered 2011-03-03: 5 mg via ORAL
  Filled 2011-03-03 (×2): qty 1

## 2011-03-03 MED ORDER — HYDROMORPHONE HCL PF 1 MG/ML IJ SOLN: 0.5000 mg | INTRAMUSCULAR | Status: DC | PRN

## 2011-03-03 MED ORDER — MORPHINE SULFATE 2 MG/ML IJ SOLN: 0.5000 mg | INTRAMUSCULAR | Status: DC | PRN

## 2011-03-03 MED ORDER — METHOCARBAMOL 500 MG PO TABS
500.0000 mg | ORAL_TABLET | Freq: Four times a day (QID) | ORAL | Status: DC | PRN
  Administered 2011-03-04: 500 mg via ORAL
  Filled 2011-03-03: qty 1

## 2011-03-03 MED ORDER — SODIUM CHLORIDE 0.9 % IV SOLN
INTRAVENOUS | Status: DC | PRN
  Administered 2011-03-03: 10:00:00 via INTRAVENOUS

## 2011-03-03 MED ORDER — METOCLOPRAMIDE HCL 10 MG PO TABS: 5.0000 mg | ORAL_TABLET | Freq: Three times a day (TID) | ORAL | Status: DC | PRN

## 2011-03-03 MED ORDER — WARFARIN SODIUM 3 MG PO TABS: 3.0000 mg | ORAL_TABLET | Freq: Every day | ORAL | Status: DC

## 2011-03-03 MED ORDER — METHOCARBAMOL 100 MG/ML IJ SOLN
500.0000 mg | Freq: Four times a day (QID) | INTRAVENOUS | Status: DC | PRN
  Filled 2011-03-03: qty 5

## 2011-03-03 MED ORDER — MIDAZOLAM HCL 5 MG/5ML IJ SOLN
INTRAMUSCULAR | Status: DC | PRN
  Administered 2011-03-03 (×2): 1 mg via INTRAVENOUS

## 2011-03-03 MED ORDER — POTASSIUM CHLORIDE CRYS ER 10 MEQ PO TBCR
10.0000 meq | EXTENDED_RELEASE_TABLET | Freq: Every day | ORAL | Status: DC
  Administered 2011-03-03 – 2011-03-04 (×2): 10 meq via ORAL
  Filled 2011-03-03 (×2): qty 1

## 2011-03-03 SURGICAL SUPPLY — 56 items
BANDAGE GAUZE 4  KLING STR (GAUZE/BANDAGES/DRESSINGS) IMPLANT
BLADE AVERAGE 25X9 (BLADE) IMPLANT
BLADE MINI RND TIP GREEN BEAV (BLADE) IMPLANT
BNDG CMPR 9X4 STRL LF SNTH (GAUZE/BANDAGES/DRESSINGS) ×1
BNDG COHESIVE 1X5 TAN STRL LF (GAUZE/BANDAGES/DRESSINGS) IMPLANT
BNDG COHESIVE 4X5 TAN STRL (GAUZE/BANDAGES/DRESSINGS) ×1 IMPLANT
BNDG COHESIVE 6X5 TAN STRL LF (GAUZE/BANDAGES/DRESSINGS) IMPLANT
BNDG ESMARK 4X9 LF (GAUZE/BANDAGES/DRESSINGS) ×2 IMPLANT
BNDG GAUZE STRTCH 6 (GAUZE/BANDAGES/DRESSINGS) IMPLANT
CLOTH BEACON ORANGE TIMEOUT ST (SAFETY) ×2 IMPLANT
CORDS BIPOLAR (ELECTRODE) ×2 IMPLANT
COVER SURGICAL LIGHT HANDLE (MISCELLANEOUS) ×2 IMPLANT
CUFF TOURNIQUET SINGLE 18IN (TOURNIQUET CUFF) IMPLANT
CUFF TOURNIQUET SINGLE 24IN (TOURNIQUET CUFF) IMPLANT
CUFF TOURNIQUET SINGLE 34IN LL (TOURNIQUET CUFF) IMPLANT
CUFF TOURNIQUET SINGLE 44IN (TOURNIQUET CUFF) IMPLANT
DRAPE U-SHAPE 47X51 STRL (DRAPES) ×2 IMPLANT
DRSG ADAPTIC 3X8 NADH LF (GAUZE/BANDAGES/DRESSINGS) ×1 IMPLANT
DRSG PAD ABDOMINAL 8X10 ST (GAUZE/BANDAGES/DRESSINGS) ×1 IMPLANT
DURAPREP 26ML APPLICATOR (WOUND CARE) ×2 IMPLANT
ELECT REM PT RETURN 9FT ADLT (ELECTROSURGICAL) ×2
ELECTRODE REM PT RTRN 9FT ADLT (ELECTROSURGICAL) ×1 IMPLANT
GAUZE KERLIX 2  STERILE LF (GAUZE/BANDAGES/DRESSINGS) ×1 IMPLANT
GAUZE SPONGE 2X2 8PLY STRL LF (GAUZE/BANDAGES/DRESSINGS) IMPLANT
GAUZE SPONGE 4X4 12PLY STRL LF (GAUZE/BANDAGES/DRESSINGS) ×1 IMPLANT
GLOVE BIOGEL PI IND STRL 7.0 (GLOVE) IMPLANT
GLOVE BIOGEL PI IND STRL 9 (GLOVE) ×1 IMPLANT
GLOVE BIOGEL PI INDICATOR 7.0 (GLOVE) ×1
GLOVE BIOGEL PI INDICATOR 9 (GLOVE) ×1
GLOVE SURG ORTHO 9.0 STRL STRW (GLOVE) ×2 IMPLANT
GLOVE SURG SS PI 6.5 STRL IVOR (GLOVE) ×1 IMPLANT
GOWN PREVENTION PLUS XLARGE (GOWN DISPOSABLE) ×1 IMPLANT
GOWN SRG XL XLNG 56XLVL 4 (GOWN DISPOSABLE) ×1 IMPLANT
GOWN STRL NON-REIN XL XLG LVL4 (GOWN DISPOSABLE)
GOWN STRL REIN XL XLG (GOWN DISPOSABLE) ×2 IMPLANT
KIT BASIN OR (CUSTOM PROCEDURE TRAY) ×2 IMPLANT
KIT ROOM TURNOVER OR (KITS) ×2 IMPLANT
MANIFOLD NEPTUNE II (INSTRUMENTS) ×2 IMPLANT
NDL HYPO 25GX1X1/2 BEV (NEEDLE) IMPLANT
NEEDLE HYPO 25GX1X1/2 BEV (NEEDLE) ×2 IMPLANT
NS IRRIG 1000ML POUR BTL (IV SOLUTION) ×2 IMPLANT
PACK ORTHO EXTREMITY (CUSTOM PROCEDURE TRAY) ×2 IMPLANT
PAD ARMBOARD 7.5X6 YLW CONV (MISCELLANEOUS) ×4 IMPLANT
PAD CAST 4YDX4 CTTN HI CHSV (CAST SUPPLIES) IMPLANT
PADDING CAST COTTON 4X4 STRL (CAST SUPPLIES)
SPECIMEN JAR SMALL (MISCELLANEOUS) ×2 IMPLANT
SPONGE GAUZE 2X2 STER 10/PKG (GAUZE/BANDAGES/DRESSINGS)
SPONGE GAUZE 4X4 12PLY (GAUZE/BANDAGES/DRESSINGS) IMPLANT
SUCTION FRAZIER TIP 10 FR DISP (SUCTIONS) ×1 IMPLANT
SUT ETHILON 2 0 PSLX (SUTURE) ×2 IMPLANT
SUT VIC AB 2-0 FS1 27 (SUTURE) IMPLANT
SYR CONTROL 10ML LL (SYRINGE) ×1 IMPLANT
TOWEL OR 17X24 6PK STRL BLUE (TOWEL DISPOSABLE) ×2 IMPLANT
TOWEL OR 17X26 10 PK STRL BLUE (TOWEL DISPOSABLE) ×2 IMPLANT
TUBE CONNECTING 12X1/4 (SUCTIONS) IMPLANT
WATER STERILE IRR 1000ML POUR (IV SOLUTION) ×2 IMPLANT

## 2011-03-03 NOTE — Anesthesia Preprocedure Evaluation (Addendum)
Anesthesia Evaluation  Patient identified by MRN, date of birth, ID band Patient awake    Reviewed: Allergy & Precautions, H&P , NPO status , Patient's Chart, lab work & pertinent test results  History of Anesthesia Complications Negative for: history of anesthetic complications  Airway Mallampati: I TM Distance: >3 FB Neck ROM: full    Dental  (+) Dental Advisory Given   Pulmonary Current Smoker,          Cardiovascular Exercise Tolerance: Good regular Normal    Neuro/Psych  Headaches, PSYCHIATRIC DISORDERS Anxiety Depression    GI/Hepatic negative GI ROS, Neg liver ROS,   Endo/Other  Diabetes mellitus-, Type 2, Insulin Dependent  Renal/GU CRFRenal disease  Genitourinary negative   Musculoskeletal   Abdominal   Peds  Hematology Blood clot Hx on Coudamin- took dose yesterday per Sharol Given request   Anesthesia Other Findings   Reproductive/Obstetrics negative OB ROS                         Anesthesia Physical Anesthesia Plan  ASA: III  Anesthesia Plan: Regional   Post-op Pain Management:    Induction: Intravenous  Airway Management Planned:   Additional Equipment:   Intra-op Plan:   Post-operative Plan:   Informed Consent: I have reviewed the patients History and Physical, chart, labs and discussed the procedure including the risks, benefits and alternatives for the proposed anesthesia with the patient or authorized representative who has indicated his/her understanding and acceptance.     Plan Discussed with: Anesthesiologist, CRNA and Surgeon  Anesthesia Plan Comments:         Anesthesia Quick Evaluation

## 2011-03-03 NOTE — Progress Notes (Signed)
ANTICOAGULATION CONSULT NOTE - Initial Consult  Pharmacy Consult for Coumadin Indication: h/o left foot ischemia/clot, s/p embolectomy 01/29/11.  Allergies  Allergen Reactions  . Aspirin Other (See Comments)    Cannot take this medication due to kidney disease  . Ibuprofen Other (See Comments)    Kidney disease    Patient Measurements: Height: 5\' 1"  (154.9 cm) (from 03/02/11 office visit) Weight: 138 lb (62.596 kg) (from 03/02/11 office vist) IBW/kg (Calculated) : 47.8    Vital Signs: Temp: 98 F (36.7 C) (02/13 1415) Temp src: Oral (02/13 0853) BP: 150/70 mmHg (02/13 1420) Pulse Rate: 82  (02/13 1425)  Labs:  [facility] 03/03/11 0911  HGB 12.1  HCT 35.0*  PLT 519*  APTT 28  LABPROT 15.7*  INR 1.22  HEPARINUNFRC --  CREATININE 0.77  CKTOTAL --  CKMB --  TROPONINI --   Estimated Creatinine Clearance: 78.5 ml/min (by C-G formula based on Cr of 0.77).  Medical History: Past Medical History  Diagnosis Date  . Shingles     11/2010  . Cellulitis     of face - 12/2010  . Peripheral vascular disease in diabetes mellitus   . Hyperlipidemia   . Tobacco abuse     0.5 - 1ppd x 21 yrs  . Headache   . Renal disorder     glomerulonephritis  . Blood transfusion   . Anxiety   . Depression   . Peripheral vascular disease   . Diabetes mellitus     IDDM Since Age 50    Medications:  Prescriptions prior to admission  Medication Sig Dispense Refill  . acetaminophen (TYLENOL) 500 MG tablet Take 1,000 mg by mouth every 6 (six) hours as needed. For pain       . ALPRAZolam (XANAX) 1 MG tablet Take 1 mg by mouth daily as needed. For anxiety      . diphenhydrAMINE (BENADRYL) 25 MG tablet Take 25 mg by mouth at bedtime as needed. For sleep      . fish oil-omega-3 fatty acids 1000 MG capsule Take 1 g by mouth daily.      . furosemide (LASIX) 20 MG tablet Take 20 mg by mouth 3 (three) times a week. On Mondays, Wednesdays, and Fridays       . insulin aspart (NOVOLOG) 100 UNIT/ML  injection Inject 5 Units into the skin 3 (three) times daily before meals.  1 vial  12  . insulin glargine (LANTUS) 100 UNIT/ML injection Inject 15 Units into the skin at bedtime.  10 mL  12  . losartan (COZAAR) 25 MG tablet Take 12.5 mg by mouth daily.      . Multiple Vitamins-Minerals (MULTIVITAMINS THER. W/MINERALS) TABS Take 1 tablet by mouth daily.        Marland Kitchen oxyCODONE-acetaminophen (PERCOCET) 10-325 MG per tablet Take 1 tablet by mouth.      . potassium chloride SA (K-DUR,KLOR-CON) 20 MEQ tablet Take 0.5 tablets (10 mEq total) by mouth daily.  7 tablet  0  . pregabalin (LYRICA) 50 MG capsule Take 1 capsule (50 mg total) by mouth 2 (two) times daily.  60 capsule  1  . sertraline (ZOLOFT) 50 MG tablet Take 50 mg by mouth daily.       Marland Kitchen warfarin (COUMADIN) 1 MG tablet Take 3 mg by mouth daily. Takes at 6pm      . Oxycodone HCl 10 MG TABS Take 10 mg by mouth every 6 (six) hours as needed. For pain  Scheduled:    .  ceFAZolin (ANCEF) IV  1 g Intravenous 60 min Pre-Op  .  ceFAZolin (ANCEF) IV  1 g Intravenous Q6H  . droperidol  0.625 mg Intravenous Once  . furosemide  20 mg Oral 3 times weekly  . HYDROmorphone      . insulin aspart  5 Units Subcutaneous TID AC  . insulin glargine  15 Units Subcutaneous QHS  . losartan  12.5 mg Oral Daily  . multivitamins ther. w/minerals  1 tablet Oral Daily  . ondansetron      . potassium chloride SA  10 mEq Oral Daily  . pregabalin  50 mg Oral BID  . sertraline  50 mg Oral Daily  . warfarin  3 mg Oral Daily  . DISCONTD: fish oil-omega-3 fatty acids  1 g Oral Daily  . DISCONTD: mupirocin ointment   Nasal BID   Infusions:    . sodium chloride    . DISCONTD: sodium chloride 50 mL/hr (03/03/11 1026)   PRN: ALPRAZolam, HYDROcodone-acetaminophen, HYDROmorphone, methocarbamol(ROBAXIN) IV, methocarbamol, metoCLOPramide (REGLAN) injection, metoCLOPramide, morphine, ondansetron (ZOFRAN) IV, ondansetron (ZOFRAN) IV, ondansetron (ZOFRAN) IV, ondansetron,  oxyCODONE-acetaminophen, zolpidem, DISCONTD: 0.9 % irrigation (POUR BTL), DISCONTD: fentaNYL, DISCONTD: HYDROmorphone, DISCONTD:  HYDROmorphone (DILAUDID) injection, DISCONTD: midazolam Anti-infectives     Start     Dose/Rate Route Frequency Ordered Stop   03/03/11 1530   ceFAZolin (ANCEF) IVPB 1 g/50 mL premix        1 g 100 mL/hr over 30 Minutes Intravenous Every 6 hours 03/03/11 1524 03/04/11 0929   03/03/11 0900   ceFAZolin (ANCEF) IVPB 1 g/50 mL premix        1 g 100 mL/hr over 30 Minutes Intravenous 60 min pre-op 03/03/11 0852 03/03/11 1055          Assessment: 41 yo female 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 She was on Coumadin prior to admission for L foot/leg ischemia s/p embolectomy on 01/29/11. She has been on coumadin about 1 month and her current home dose 3mg  daily. Reports her last dose taken yesterday 03/02/11 and no reversal with Vitamin K noted or per patient's report.  INR 1.22 this AM. H/H 12.1/35, pltc 519K.  Goal of Therapy:  INR 2-3   Plan:  Coumadin 5 mg tonight x 1 INR daily Monitor for s/sx of bleeding.   Nicole Cella , RPh 03/03/2011,3:35 PM

## 2011-03-03 NOTE — Op Note (Signed)
OPERATIVE REPORT  DATE OF SURGERY: 03/03/2011  PATIENT:  [name],  41 y.o. female  PRE-OPERATIVE DIAGNOSIS:  Gangrene Left Foot 4th and 5th toes  POST-OPERATIVE DIAGNOSIS:  Gangrene Left Foot 4th and 5th toes  PROCEDURE:  Procedure(s): AMPUTATION DIGIT. Amputation of fourth and fifth ray left foot at the metatarsal neck.  SURGEON:  Surgeon(s): Newt Minion, MD  ANESTHESIA:   regional  EBL:  Minimal ML  SPECIMEN:  No Specimen  TOURNIQUET:  * No tourniquets in log *  PROCEDURE DETAILS: Patient is a 41 year old woman status post revascularization for an ischemic left lower extremity. Patient's leg has stabilized well her foot is warm but she does have full full thickness dry gangrene of the fourth and fifth toes which has demarcated at this time. She has partial-thickness gangrenous changes to the great toe but feel that this should heal uneventfully. Patient presents at this time for amputation of the dry gangrene digits of the fourth and fifth toes. Risks and benefits of surgery were discussed including infection neurovascular injury nonhealing of the wound need for higher level amputation patient states he understands and wishes to proceed at this time. Description of procedure patient brought to or room for a after undergoing an ankle block. After adequate levels of anesthesia were obtained patient's left lower extremity was prepped using DuraPrep and draped into a sterile field. An incision was made just proximal to the gangrenous changes of the fourth and fifth toes. The wound was attempted to be closed without resection of metatarsal heads however there was insufficient soft tissue to cover the fourth and fifth metatarsal heads and an amputation was made through the metatarsal neck of the fourth and fifth metatarsals. The wound closed easily over the amputation. 2-0 nylon was used for closure there is no ischemic changes to the skin there was good petechial bleeding. The wound  was covered with Adaptic 4 x 4's Kerlix and Coban patient was taken to the PACU in stable condition plan for overnight observation.  PLAN OF CARE: Admit for overnight observation  PATIENT DISPOSITION:  PACU - hemodynamically stable.   Newt Minion, MD 03/03/2011 11:23 AM

## 2011-03-03 NOTE — Anesthesia Postprocedure Evaluation (Signed)
  Anesthesia Post-op Note  Patient: [name]  Procedure(s) Performed: Procedure(s) (LRB): AMPUTATION DIGIT (Left)  Patient Location: PACU  Anesthesia Type: MAC combined with regional for post-op pain  Level of Consciousness: awake, alert , oriented and patient cooperative  Airway and Oxygen Therapy: Patient Spontanous Breathing and Patient connected to nasal cannula oxygen  Post-op Pain: none  Post-op Assessment: Post-op Vital signs reviewed, Patient's Cardiovascular Status Stable, Respiratory Function Stable, Patent Airway, No signs of Nausea or vomiting and Pain level controlled  Post-op Vital Signs: stable  Complications: No apparent anesthesia complications

## 2011-03-03 NOTE — Transfer of Care (Signed)
Immediate Anesthesia Transfer of Care Note  Patient: [name]  Procedure(s) Performed: Procedure(s) (LRB): AMPUTATION DIGIT (Left)  Patient Location: PACU  Anesthesia Type: MAC  Level of Consciousness: awake, alert  and oriented  Airway & Oxygen Therapy: Patient Spontanous Breathing and Patient connected to face mask oxygen  Post-op Assessment: Report given to PACU RN and Post -op Vital signs reviewed and stable  Post vital signs: Reviewed and stable  Complications: No apparent anesthesia complications

## 2011-03-03 NOTE — Preoperative (Signed)
Beta Blockers   Reason not to administer Beta Blockers:Not Applicable 

## 2011-03-03 NOTE — Anesthesia Procedure Notes (Signed)
Anesthesia Regional Block:  Ankle block  Pre-Anesthetic Checklist: ,, timeout performed, Correct Patient, Correct Site, Correct Laterality, Correct Procedure, Correct Position, site marked, Risks and benefits discussed,  Surgical consent,  Pre-op evaluation,  At surgeon's request and post-op pain management  Laterality: Left  Prep: Maximum Sterile Barrier Precautions used and chloraprep       Needles:  Injection technique: Single-shot     Needle Length:cm 4 cm Needle Gauge: 25 and 25 G  Needle insertion depth: 3 cm   Additional Needles: Ankle block Narrative:  Start time: 03/03/2011 10:05 AM End time: 03/03/2011 10:10 AM Injection made incrementally with aspirations every 5 mL.  Events: injection painful  Performed by: Personally  Anesthesiologist: Sharolyn Douglas MD  Additional Notes: 40cc 2% Lidocaine Ant/Post Tibial and Sural   W/o difficulty w/ discomfort. Sedation 2cc Fentanyl and 1cc Versed  GES

## 2011-03-03 NOTE — H&P (Signed)
[name] is an 41 y.o. female.   Chief Complaint: gangrene left foot fourth and fifth toes HPI: patient is a 41 year old woman status post revascularization for the left lower extremity the ischemic changes to the midfoot and forefoot have resolved however patient now has dry gangrene of the third and fourth toes and partial gangrene of the great toe.  Past Medical History  Diagnosis Date  . Shingles     11/2010  . Cellulitis     of face - 12/2010  . Peripheral vascular disease in diabetes mellitus   . Hyperlipidemia   . Tobacco abuse     0.5 - 1ppd x 21 yrs  . Headache   . Diabetes mellitus     Since Age 72  . Renal disorder     glomerulonephritis  . Blood transfusion   . Anxiety   . Depression   . Peripheral vascular disease     Past Surgical History  Procedure Date  . Wrist surgery     left  . Embolectomy 01/29/2011    Procedure: EMBOLECTOMY;  Surgeon: Mal Misty, MD;  Location: [facility] OR;  Service: Vascular;  Laterality: Left;  Left Popliteal and Tibial Embolectomy with patch angioplasty  . Dilation and curettage of uterus   . Multiple tooth extractions     Family History  Problem Relation Age of Onset  . COPD Mother     alive - 78  . Hypertension Mother   . Diabetes Mother   . Stroke Mother   . Coronary artery disease Mother   . Diabetes Father     alive - 56  . Hypertension Father   . Coronary artery disease Father   . Anesthesia problems Neg Hx    Social History:  reports that she quit smoking about 4 weeks ago. Her smoking use included Cigarettes. She has a 21 pack-year smoking history. She has never used smokeless tobacco. She reports that she does not drink alcohol or use illicit drugs.  Allergies:  Allergies  Allergen Reactions  . Aspirin Other (See Comments)    Cannot take this medication due to kidney disease  . Ibuprofen Other (See Comments)    Kidney disease    No current facility-administered medications on file as of .   Medications  Prior to Admission  Medication Sig Dispense Refill  . acetaminophen (TYLENOL) 500 MG tablet Take 1,000 mg by mouth every 6 (six) hours as needed. For pain       . ALPRAZolam (XANAX) 1 MG tablet Take 1 mg by mouth daily as needed. For anxiety      . diphenhydrAMINE (BENADRYL) 25 MG tablet Take 25 mg by mouth at bedtime as needed. For sleep      . fish oil-omega-3 fatty acids 1000 MG capsule Take 1 g by mouth daily.      . furosemide (LASIX) 20 MG tablet Take 20 mg by mouth 3 (three) times a week. On Mondays, Wednesdays, and Fridays       . insulin aspart (NOVOLOG) 100 UNIT/ML injection Inject 5 Units into the skin 3 (three) times daily before meals.  1 vial  12  . insulin glargine (LANTUS) 100 UNIT/ML injection Inject 15 Units into the skin at bedtime.  10 mL  12  . losartan (COZAAR) 25 MG tablet Take 12.5 mg by mouth daily.      . Multiple Vitamins-Minerals (MULTIVITAMINS THER. W/MINERALS) TABS Take 1 tablet by mouth daily.        Marland Kitchen  Oxycodone HCl 10 MG TABS Take 10 mg by mouth every 6 (six) hours as needed. For pain      . potassium chloride SA (K-DUR,KLOR-CON) 20 MEQ tablet Take 0.5 tablets (10 mEq total) by mouth daily.  7 tablet  0  . pregabalin (LYRICA) 50 MG capsule Take 1 capsule (50 mg total) by mouth 2 (two) times daily.  60 capsule  1  . sertraline (ZOLOFT) 50 MG tablet Take 50 mg by mouth daily.       Marland Kitchen warfarin (COUMADIN) 1 MG tablet Take 3 mg by mouth daily. Takes at 6pm        No results found for this or any previous visit (from the past 26 hour(s)). No results found.  Review of Systems  All other systems reviewed and are negative.    Last menstrual period 01/11/2011. Physical Exam  On examination there is a partial-thickness gangrenous changes to the great toe but the date great toe does appear viable. She has dry gangrenous changes to the fourth and fifth toe and these are and not salvageable not viable. She does have a warm foot with a good dorsalis pedis  pulse. Assessment/Plan Assessment: Dry gangrene fourth and fifth toes left foot. Plan: Plan for amputation of the fourth and fifth toes at the MTP joint left foot. Risks and benefits were discussed including persistent infection neurovascular injury nonhealing of the wound need for higher level amputation potential need for a dictation the great toe he states she understands which pursue this time.  [name] 03/03/2011, 6:17 AM

## 2011-03-04 ENCOUNTER — Encounter (HOSPITAL_COMMUNITY): Payer: Self-pay | Admitting: Orthopedic Surgery

## 2011-03-04 LAB — GLUCOSE, CAPILLARY
Glucose-Capillary: 57 mg/dL — ABNORMAL LOW (ref 70–99)
Glucose-Capillary: 52 mg/dL — ABNORMAL LOW (ref 70–99)
Glucose-Capillary: 77 mg/dL (ref 70–99)
Glucose-Capillary: 72 mg/dL (ref 70–99)

## 2011-03-04 LAB — PROTIME-INR
INR: 1.44 (ref 0.00–1.49)
Prothrombin Time: 17.8 seconds — ABNORMAL HIGH (ref 11.6–15.2)

## 2011-03-04 MED ORDER — ONDANSETRON HCL 4 MG PO TABS: 4.0000 mg | ORAL_TABLET | Freq: Three times a day (TID) | ORAL | Status: AC | PRN

## 2011-03-04 MED ORDER — COUMADIN BOOK
Status: DC
Start: 2011-03-04 — End: 2011-03-04
  Filled 2011-03-04: qty 1

## 2011-03-04 NOTE — Progress Notes (Signed)
Physical Therapy Evaluation Patient Details Name: [name] MRN: [id number] DOB: 1970/10/22 Today's Date: 03/04/2011  Problem List:  Patient Active Problem List  Diagnoses  . Cellulitis and abscess of face  . Diabetes mellitus  . Glomerulonephritis  . Peripheral vascular disease in diabetes mellitus  . Hyperlipidemia  . Tobacco abuse  . Abnormal ECG  . Pre-operative cardiovascular examination  . PVD (peripheral vascular disease)  . Cellulitis of left foot  . Other disorder of muscle, ligament, and fascia  . Gangrene    Past Medical History:  Past Medical History  Diagnosis Date  . Shingles     11/2010  . Cellulitis     of face - 12/2010  . Peripheral vascular disease in diabetes mellitus   . Hyperlipidemia   . Tobacco abuse     0.5 - 1ppd x 21 yrs  . Headache   . Renal disorder     glomerulonephritis  . Blood transfusion   . Anxiety   . Depression   . Peripheral vascular disease   . Diabetes mellitus     IDDM Since Age 33   Past Surgical History:  Past Surgical History  Procedure Date  . Wrist surgery     left  . Embolectomy 01/29/2011    Procedure: EMBOLECTOMY;  Surgeon: Mal Misty, MD;  Location: [facility] OR;  Service: Vascular;  Laterality: Left;  Left Popliteal and Tibial Embolectomy with patch angioplasty  . Dilation and curettage of uterus   . Multiple tooth extractions     PT Assessment/Plan/Recommendation PT Assessment Clinical Impression Statement: Pt is a 41 y/o female admitted s/p left toe amputations along with the below PT problem list.  Pt would benefit from acute PT to maximize independence and facilitate d/c home with HHPT. PT Recommendation/Assessment: Patient will need skilled PT in the acute care venue PT Problem List: Decreased strength;Decreased activity tolerance;Decreased balance;Decreased mobility;Decreased knowledge of use of DME;Decreased knowledge of precautions;Pain Barriers to Discharge: None PT Therapy Diagnosis : Difficulty  walking;Acute pain PT Plan PT Frequency: Min 5X/week PT Treatment/Interventions: DME instruction;Gait training;Functional mobility training;Therapeutic activities;Balance training;Patient/family education PT Recommendation Follow Up Recommendations: Home health PT Equipment Recommended: None recommended by PT PT Goals  Acute Rehab PT Goals PT Goal Formulation: With patient/family Time For Goal Achievement: 7 days Pt will go Supine/Side to Sit: with modified independence PT Goal: Supine/Side to Sit - Progress: Goal set today Pt will go Sit to Supine/Side: with modified independence PT Goal: Sit to Supine/Side - Progress: Goal set today Pt will go Sit to Stand: with modified independence PT Goal: Sit to Stand - Progress: Goal set today Pt will go Stand to Sit: with modified independence PT Goal: Stand to Sit - Progress: Goal set today Pt will Ambulate: >150 feet;with modified independence;with least restrictive assistive device PT Goal: Ambulate - Progress: Goal set today Pt will Go Up / Down Stairs: 1-2 stairs;with modified independence;with rail(s) PT Goal: Up/Down Stairs - Progress: Goal set today  PT Evaluation Precautions/Restrictions  Precautions Precautions: Fall Required Braces or Orthoses: Yes Other Brace/Splint: Cast shoe left LE. Restrictions Weight Bearing Restrictions: Yes LLE Weight Bearing: Touchdown weight bearing Prior Functioning  Home Living Lives With: Family;Other (Comment) Celesta Gentile) Receives Help From: Family Type of Home: House Home Layout: One level Home Access: Stairs to enter Entrance Stairs-Rails: Right;Left;Can reach both Entrance Stairs-Number of Steps: 2 Home Adaptive Equipment: Walker - rolling;Wheelchair - manual;Bedside commode/3-in-1 Prior Function Level of Independence: Independent with basic ADLs;Independent with homemaking with ambulation;Independent with gait;Independent  with transfers Able to Take Stairs?: Yes Driving:  No Cognition Cognition Arousal/Alertness: Awake/alert Overall Cognitive Status: Appears within functional limits for tasks assessed Orientation Level: Oriented X4 Sensation/Coordination Sensation Light Touch: Appears Intact Stereognosis: Not tested Hot/Cold: Not tested Proprioception: Not tested Coordination Gross Motor Movements are Fluid and Coordinated: Yes Fine Motor Movements are Fluid and Coordinated: Yes Extremity Assessment RUE Assessment RUE Assessment: Within Functional Limits LUE Assessment LUE Assessment: Within Functional Limits RLE Assessment RLE Assessment: Within Functional Limits LLE Assessment LLE Assessment: Exceptions to [facility] LLE Strength LLE Overall Strength: Deficits;Due to pain LLE Overall Strength Comments: 3/5 Mobility (including Balance) Bed Mobility Bed Mobility: Yes Supine to Sit: 6: Modified independent (Device/Increase time) Transfers Transfers: Yes Sit to Stand: 5: Supervision;With upper extremity assist;From bed;From chair/3-in-1 (3 trials.) Sit to Stand Details (indicate cue type and reason): Verbal cues for hand placement/safety. Stand to Sit: 5: Supervision;With upper extremity assist;To chair/3-in-1 (3 trials.) Stand to Sit Details: Verbal cues for hand placement/safety. Ambulation/Gait Ambulation/Gait: Yes Ambulation/Gait Assistance: 5: Supervision Ambulation/Gait Assistance Details (indicate cue type and reason): Verbal cues for sequence as well as TDWBing left LE. Ambulation Distance (Feet): 50 Feet (25 feet x 2 trials.) Assistive device: Rolling walker Gait Pattern: Step-to pattern;Decreased step length - left;Decreased stance time - left Stairs: Yes Stairs Assistance: 4: Min assist Stairs Assistance Details (indicate cue type and reason): Assist to off weight left LE during sequence "up with good, down with bad." Stair Management Technique: Two rails;Step to pattern;Forwards Number of Stairs: 4  Height of Stairs: 8   (inches) Wheelchair Mobility Wheelchair Mobility: No  Posture/Postural Control Posture/Postural Control: No significant limitations Balance Balance Assessed: No End of Session PT - End of Session Equipment Utilized During Treatment: Gait belt Activity Tolerance: Patient tolerated treatment well Patient left: in chair;with call bell in reach;with family/visitor present Nurse Communication: Mobility status for transfers;Mobility status for ambulation General Behavior During Session: [facility] for tasks performed Cognition: [facility] for tasks performed  Cyndia Bent 03/04/2011, 1:52 PM  03/04/2011 Cyndia Bent, PT, DPT [phone]

## 2011-03-04 NOTE — Progress Notes (Signed)
ANTICOAGULATION CONSULT NOTE - Initial Consult  Pharmacy Consult for Coumadin Indication: h/o left foot ischemia/clot, s/p embolectomy 01/29/11.  Allergies  Allergen Reactions  . Aspirin Other (See Comments)    Cannot take this medication due to kidney disease  . Ibuprofen Other (See Comments)    Kidney disease    Patient Measurements: Height: 5\' 1"  (154.9 cm) (from 03/02/11 office visit) Weight: 138 lb (62.596 kg) (from 03/02/11 office vist) IBW/kg (Calculated) : 47.8    Vital Signs: Temp: 97.7 F (36.5 C) (02/14 0536) Temp src: Oral (02/14 0536) BP: 115/64 mmHg (02/14 0536) Pulse Rate: 92  (02/14 0536)  Labs:  Basename 03/04/11 0515 03/03/11 0911  HGB -- 12.1  HCT -- 35.0*  PLT -- 519*  APTT -- 28  LABPROT 17.8* 15.7*  INR 1.44 1.22  HEPARINUNFRC -- --  CREATININE -- 0.77  CKTOTAL -- --  CKMB -- --  TROPONINI -- --   Estimated Creatinine Clearance: 78.5 ml/min (by C-G formula based on Cr of 0.77).  Medical History: Past Medical History  Diagnosis Date  . Shingles     11/2010  . Cellulitis     of face - 12/2010  . Peripheral vascular disease in diabetes mellitus   . Hyperlipidemia   . Tobacco abuse     0.5 - 1ppd x 21 yrs  . Headache   . Renal disorder     glomerulonephritis  . Blood transfusion   . Anxiety   . Depression   . Peripheral vascular disease   . Diabetes mellitus     IDDM Since Age 73    Medications:  Prescriptions prior to admission  Medication Sig Dispense Refill  . acetaminophen (TYLENOL) 500 MG tablet Take 1,000 mg by mouth every 6 (six) hours as needed. For pain       . ALPRAZolam (XANAX) 1 MG tablet Take 1 mg by mouth daily as needed. For anxiety      . diphenhydrAMINE (BENADRYL) 25 MG tablet Take 25 mg by mouth at bedtime as needed. For sleep      . fish oil-omega-3 fatty acids 1000 MG capsule Take 1 g by mouth daily.      . furosemide (LASIX) 20 MG tablet Take 20 mg by mouth 3 (three) times a week. On Mondays, Wednesdays, and  Fridays       . insulin aspart (NOVOLOG) 100 UNIT/ML injection Inject 5 Units into the skin 3 (three) times daily before meals.  1 vial  12  . insulin glargine (LANTUS) 100 UNIT/ML injection Inject 15 Units into the skin at bedtime.  10 mL  12  . losartan (COZAAR) 25 MG tablet Take 12.5 mg by mouth daily.      . Multiple Vitamins-Minerals (MULTIVITAMINS THER. W/MINERALS) TABS Take 1 tablet by mouth daily.        Marland Kitchen oxyCODONE-acetaminophen (PERCOCET) 10-325 MG per tablet Take 1 tablet by mouth.      . potassium chloride SA (K-DUR,KLOR-CON) 20 MEQ tablet Take 0.5 tablets (10 mEq total) by mouth daily.  7 tablet  0  . pregabalin (LYRICA) 50 MG capsule Take 1 capsule (50 mg total) by mouth 2 (two) times daily.  60 capsule  1  . sertraline (ZOLOFT) 50 MG tablet Take 50 mg by mouth daily.       Marland Kitchen warfarin (COUMADIN) 1 MG tablet Take 3 mg by mouth daily. Takes at [place]: Oxycodone HCl 10 MG TABS Take 10 mg by mouth  every 6 (six) hours as needed. For pain       Scheduled:     .  ceFAZolin (ANCEF) IV  1 g Intravenous Q6H  . coumadin book   Does not apply NOW  . furosemide  20 mg Oral 3 times weekly  . HYDROmorphone      . insulin aspart  5 Units Subcutaneous TID AC  . insulin glargine  15 Units Subcutaneous QHS  . losartan  12.5 mg Oral Daily  . multivitamins ther. w/minerals  1 tablet Oral Daily  . omega-3 acid ethyl esters  1 g Oral Daily  . ondansetron      . potassium chloride SA  10 mEq Oral Daily  . pregabalin  50 mg Oral BID  . sertraline  50 mg Oral Daily  . warfarin  5 mg Oral ONCE-1800  . DISCONTD: fish oil-omega-3 fatty acids  1 g Oral Daily  . DISCONTD: mupirocin ointment   Nasal BID  . DISCONTD: warfarin  3 mg Oral Daily   Infusions:     . sodium chloride 20 mL/hr at 03/03/11 1649  . DISCONTD: sodium chloride 50 mL/hr (03/03/11 1026)   PRN: ALPRAZolam, HYDROcodone-acetaminophen, HYDROmorphone, methocarbamol(ROBAXIN) IV, methocarbamol, metoCLOPramide (REGLAN)  injection, metoCLOPramide, morphine, ondansetron (ZOFRAN) IV, ondansetron (ZOFRAN) IV, ondansetron, oxyCODONE-acetaminophen, zolpidem, DISCONTD: fentaNYL, DISCONTD: HYDROmorphone, DISCONTD:  HYDROmorphone (DILAUDID) injection, DISCONTD: midazolam Anti-infectives     Start     Dose/Rate Route Frequency Ordered Stop   03/03/11 1600   ceFAZolin (ANCEF) IVPB 1 g/50 mL premix        1 g 100 mL/hr over 30 Minutes Intravenous Every 6 hours 03/03/11 1524 03/04/11 0337   03/03/11 0900   ceFAZolin (ANCEF) IVPB 1 g/50 mL premix        1 g 100 mL/hr over 30 Minutes Intravenous 60 min pre-op 03/03/11 [id number] 03/03/11 1055          Assessment: 41 yo female 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 She was on Coumadin prior to admission for L foot/leg ischemia s/p embolectomy on 01/29/11. She has been on coumadin about 1 month and her current home dose 3mg  daily. Reports her last dose taken yesterday 03/02/11 and no reversal with Vitamin K noted or per patient's report.  INR 1.44 this AM. No bleeding reported. Patient is to be discharged today. She is to continue her coumadin as taken prior to admission. Patient states Dr. Sharol Given instructed her to talk to Dr. Kellie Simmering about coumadin dose adjustment. She reports that [facility] home health was monitoring PT/INR prior to admission. Patient also reports PTA she was prescribed Cipro for UTI by her family MD but she did not get the RX filled. She feels she needs to take the Cipro. I instructed patient to notify [facility] and Dr. Kellie Simmering if she starts the Cipro as it may increase effect of her coumadin. As INR is subtherapeutic now, it is okay to take 5mg  Coumadin today and start cipro. [facility] to follow up INR.   Goal of Therapy:  INR 2-3 (confirmed with patient that Dr. Kellie Simmering set INR goal as 2-3 prior to admission)   Plan:  I have instructed patient to take Coumadin 5 mg tonight (she has 1mg  tablets at home).  She will notify [facility] and Dr. Kellie Simmering of her discharge and  need for INR check tomorrow & at least weekly per protocol as prior to admission per Dr. Kellie Simmering. Coumadin education book given to patient and I reviewed education  with her.   Nicole Cella , RPh 03/04/2011,12:32 PM

## 2011-03-04 NOTE — Discharge Summary (Signed)
Physician Discharge Summary  Patient ID: [name] MRN: [id number] DOB/AGE: 1970-10-26 41 y.o.  Admit date: 03/03/2011 Discharge date: 03/04/2011  Admission Diagnoses: Gangrene left foot Discharge Diagnoses: Same Active Problems:  * No active hospital problems. *    Discharged Condition: stable  Hospital Course: Patient's hospital course was essentially unremarkable. She underwent amputation of the fourth and fifth rays left foot on 03/03/2011. Patient had a varus minimal amount of bleeding. Patient was stable and comfortable at time of discharge.  Consults: None  Significant Diagnostic Studies: labs: Routine labs  Treatments: surgery: Left foot amputation fourth and fifth rays.  Discharge Exam: Blood pressure 115/64, pulse 92, temperature 97.7 F (36.5 C), temperature source Oral, resp. rate 18, height 5\' 1"  (1.549 m), weight 62.596 kg (138 lb), last menstrual period 01/11/2011, SpO2 100.00%. Incision/Wound: incision clean and dry at time of discharge.  Disposition: Home or Self Care  Discharge Orders    Future Appointments: Provider: Department: Dept Phone: Center:   06/08/2011 2:30 PM [facility] [phone] [facility]   06/08/2011 3:00 PM Tinnie Gens, MD [facility] [phone] [facility]     Future Orders Please Complete By Expires   Diet - low sodium heart healthy      Call MD / Call 911      Comments:   If you experience chest pain or shortness of breath, CALL 911 and be transported to the hospital emergency room.  If you develope a fever above 101 F, pus (white drainage) or increased drainage or redness at the wound, or calf pain, call your surgeon's office.   Constipation Prevention      Comments:   Drink plenty of fluids.  Prune juice may be helpful.  You may use a stool softener, such as Colace (over the counter) 100 mg twice a day.  Use MiraLax (over the counter) for constipation as needed.   Increase activity slowly as tolerated      Weight Bearing  as taught in Physical Therapy      Comments:   Use a walker or crutches as instructed.     Medication List  As of 03/04/2011  7:03 AM   TAKE these medications         acetaminophen 500 MG tablet   Commonly known as: TYLENOL   Take 1,000 mg by mouth every 6 (six) hours as needed. For pain      ALPRAZolam 1 MG tablet   Commonly known as: XANAX   Take 1 mg by mouth daily as needed. For anxiety      diphenhydrAMINE 25 MG tablet   Commonly known as: BENADRYL   Take 25 mg by mouth at bedtime as needed. For sleep      fish oil-omega-3 fatty acids 1000 MG capsule   Take 1 g by mouth daily.      furosemide 20 MG tablet   Commonly known as: LASIX   Take 20 mg by mouth 3 (three) times a week. On Mondays, Wednesdays, and Fridays      insulin aspart 100 UNIT/ML injection   Commonly known as: novoLOG   Inject 5 Units into the skin 3 (three) times daily before meals.      insulin glargine 100 UNIT/ML injection   Commonly known as: LANTUS   Inject 15 Units into the skin at bedtime.      losartan 25 MG tablet   Commonly known as: COZAAR   Take 12.5 mg by mouth daily.      multivitamins  ther. w/minerals Tabs   Take 1 tablet by mouth daily.      ondansetron 4 MG tablet   Commonly known as: ZOFRAN   Take 1 tablet (4 mg total) by mouth every 8 (eight) hours as needed for nausea.      oxyCODONE-acetaminophen 10-325 MG per tablet   Commonly known as: PERCOCET   Take 1 tablet by mouth.      oxyCODONE-acetaminophen 7.5-325 MG per tablet   Commonly known as: PERCOCET   Take 1 tablet by mouth every 4 (four) hours as needed for pain.      potassium chloride SA 20 MEQ tablet   Commonly known as: K-DUR,KLOR-CON   Take 0.5 tablets (10 mEq total) by mouth daily.      pregabalin 50 MG capsule   Commonly known as: LYRICA   Take 1 capsule (50 mg total) by mouth 2 (two) times daily.      sertraline 50 MG tablet   Commonly known as: ZOLOFT   Take 50 mg by mouth daily.      warfarin 1 MG  tablet   Commonly known as: COUMADIN   Take 3 mg by mouth daily. Takes at [place] your doctor about these medications         Oxycodone HCl 10 MG Tabs   Take 10 mg by mouth every 6 (six) hours as needed. For pain           Follow-up Information    Follow up with Marjarie Irion V, MD in 2 weeks.   Contact information:   [place] [phone]          Signed: Newt Minion 03/04/2011, 7:03 AM

## 2011-03-04 NOTE — Progress Notes (Signed)
CBG 52  Treatment: 15 GM carbohydrate snack  Symptoms: Pale and Shaky  Follow-up CBG: [id number] CBG Result:77  Possible Reasons for Event: Inadequate meal intake  Comments/MD notified:no    [name], [name]

## 2011-03-04 NOTE — Progress Notes (Signed)
CBG: 57  Treatment: 15 GM carbohydrate snack  Symptoms: Pale and Shaky  Follow-up CBG: Time:0535 CBG Result:52  Possible Reasons for Event: Inadequate meal intake  Comments/MD notified:no    Annamary Carolin, Emily Filbert

## 2011-03-11 ENCOUNTER — Encounter (HOSPITAL_COMMUNITY): Payer: Self-pay | Admitting: *Deleted

## 2011-03-11 ENCOUNTER — Emergency Department (HOSPITAL_COMMUNITY)
Admission: EM | Admit: 2011-03-11 | Discharge: 2011-03-12 | Disposition: A | Discharge status: Home / Self Care | Payer: Medicaid Other | Attending: Emergency Medicine | Admitting: Emergency Medicine

## 2011-03-11 DIAGNOSIS — F411 Generalized anxiety disorder: Secondary | ICD-10-CM | POA: Insufficient documentation

## 2011-03-11 DIAGNOSIS — R221 Localized swelling, mass and lump, neck: Secondary | ICD-10-CM | POA: Insufficient documentation

## 2011-03-11 DIAGNOSIS — M79673 Pain in unspecified foot: Secondary | ICD-10-CM

## 2011-03-11 DIAGNOSIS — Z87891 Personal history of nicotine dependence: Secondary | ICD-10-CM | POA: Insufficient documentation

## 2011-03-11 DIAGNOSIS — Z7901 Long term (current) use of anticoagulants: Secondary | ICD-10-CM | POA: Insufficient documentation

## 2011-03-11 DIAGNOSIS — R220 Localized swelling, mass and lump, head: Secondary | ICD-10-CM | POA: Insufficient documentation

## 2011-03-11 DIAGNOSIS — E1159 Type 2 diabetes mellitus with other circulatory complications: Secondary | ICD-10-CM | POA: Insufficient documentation

## 2011-03-11 DIAGNOSIS — I798 Other disorders of arteries, arterioles and capillaries in diseases classified elsewhere: Secondary | ICD-10-CM | POA: Insufficient documentation

## 2011-03-11 DIAGNOSIS — F3289 Other specified depressive episodes: Secondary | ICD-10-CM | POA: Insufficient documentation

## 2011-03-11 DIAGNOSIS — Z794 Long term (current) use of insulin: Secondary | ICD-10-CM | POA: Insufficient documentation

## 2011-03-11 DIAGNOSIS — M79609 Pain in unspecified limb: Secondary | ICD-10-CM | POA: Insufficient documentation

## 2011-03-11 DIAGNOSIS — S98139A Complete traumatic amputation of one unspecified lesser toe, initial encounter: Secondary | ICD-10-CM | POA: Insufficient documentation

## 2011-03-11 DIAGNOSIS — F329 Major depressive disorder, single episode, unspecified: Secondary | ICD-10-CM | POA: Insufficient documentation

## 2011-03-11 DIAGNOSIS — E785 Hyperlipidemia, unspecified: Secondary | ICD-10-CM | POA: Insufficient documentation

## 2011-03-11 LAB — DIFFERENTIAL
Basophils Absolute: 0 10*3/uL (ref 0.0–0.1)
Basophils Relative: 0 % (ref 0–1)
Eosinophils Absolute: 0.2 10*3/uL (ref 0.0–0.7)
Eosinophils Relative: 2 % (ref 0–5)
Lymphocytes Relative: 26 % (ref 12–46)
Lymphs Abs: 2.8 10*3/uL (ref 0.7–4.0)
Monocytes Absolute: 0.5 10*3/uL (ref 0.1–1.0)
Monocytes Relative: 4 % (ref 3–12)
Neutro Abs: 7.4 10*3/uL (ref 1.7–7.7)
Neutrophils Relative %: 68 % (ref 43–77)

## 2011-03-11 LAB — CBC
HCT: 34 % — ABNORMAL LOW (ref 36.0–46.0)
Hemoglobin: 11.5 g/dL — ABNORMAL LOW (ref 12.0–15.0)
MCH: 30.6 pg (ref 26.0–34.0)
MCHC: 33.8 g/dL (ref 30.0–36.0)
MCV: 90.4 fL (ref 78.0–100.0)
Platelets: 443 10*3/uL — ABNORMAL HIGH (ref 150–400)
RBC: 3.76 MIL/uL — ABNORMAL LOW (ref 3.87–5.11)
RDW: 14.3 % (ref 11.5–15.5)
WBC: 10.9 10*3/uL — ABNORMAL HIGH (ref 4.0–10.5)

## 2011-03-11 LAB — BASIC METABOLIC PANEL
BUN: 13 mg/dL (ref 6–23)
CO2: 25 mEq/L (ref 19–32)
Calcium: 7.6 mg/dL — ABNORMAL LOW (ref 8.4–10.5)
Chloride: 97 mEq/L (ref 96–112)
Creatinine, Ser: 0.84 mg/dL (ref 0.50–1.10)
GFR calc Af Amer: >90 mL/min (ref >90)
GFR calc non Af Amer: 85 mL/min — ABNORMAL LOW (ref >90)
Glucose, Bld: 599 mg/dL (ref 70–99)
Potassium: 4.1 mEq/L (ref 3.5–5.1)
Sodium: 128 mEq/L — ABNORMAL LOW (ref 135–145)

## 2011-03-11 LAB — GLUCOSE, CAPILLARY
Glucose-Capillary: 427 mg/dL — ABNORMAL HIGH (ref 70–99)
Glucose-Capillary: 506 mg/dL — ABNORMAL HIGH (ref 70–99)

## 2011-03-11 MED ORDER — SODIUM CHLORIDE 0.9 % IV BOLUS (SEPSIS)
1000.0000 mL | Freq: Once | INTRAVENOUS | Status: AC
  Administered 2011-03-11: 1000 mL via INTRAVENOUS

## 2011-03-11 MED ORDER — INSULIN ASPART 100 UNIT/ML ~~LOC~~ SOLN
12.0000 [IU] | Freq: Once | SUBCUTANEOUS | Status: AC
  Administered 2011-03-11: 12 [IU] via SUBCUTANEOUS
  Filled 2011-03-11: qty 1

## 2011-03-11 MED ORDER — HYDROMORPHONE HCL PF 1 MG/ML IJ SOLN
1.0000 mg | Freq: Once | INTRAMUSCULAR | Status: DC
  Filled 2011-03-11: qty 1

## 2011-03-11 MED ORDER — HYDROMORPHONE HCL PF 1 MG/ML IJ SOLN
1.0000 mg | Freq: Once | INTRAMUSCULAR | Status: AC
  Administered 2011-03-11: 1 mg via INTRAVENOUS

## 2011-03-11 NOTE — ED Provider Notes (Signed)
History    This chart was scribed for Lisa Diego, MD, MD by Rhae Lerner. The patient was seen in room APA08 and the patient's care was started at 10:07PM.   CSN: [id number]  Arrival date & time 03/11/11  2049   First MD Initiated Contact with Patient 03/11/11 2205      Chief Complaint  Patient presents with  . Foot Pain    (Consider location/radiation/quality/duration/timing/severity/associated sxs/prior treatment) Patient is a 41 y.o. female presenting with lower extremity pain. The history is provided by the patient.  Foot Pain This is a new problem. The current episode started more than 1 week ago. The problem occurs constantly. The problem has not changed since onset.The symptoms are aggravated by nothing. The symptoms are relieved by medications.   [name] is a 41 y.o. female who presents to the Emergency Department complaining of moderate left foot pain. Pt had 2 toes amputated 8 days ago due to blood clot in leg. EMS reports that that her CBG meter read high (500). Pt reports running out of her pain medication. She is suppose to see her surgeon within the next week. Pt is not in any other pain. The pain has been constant since onset.  PCP is Dr. Luan Pulling  Past Medical History  Diagnosis Date  . Shingles     11/2010  . Cellulitis     of face - 12/2010  . Peripheral vascular disease in diabetes mellitus   . Hyperlipidemia   . Tobacco abuse     0.5 - 1ppd x 21 yrs  . Headache   . Renal disorder     glomerulonephritis  . Blood transfusion   . Anxiety   . Depression   . Peripheral vascular disease   . Diabetes mellitus     IDDM Since Age 58    Past Surgical History  Procedure Date  . Wrist surgery     left  . Embolectomy 01/29/2011    Procedure: EMBOLECTOMY;  Surgeon: Mal Misty, MD;  Location: [facility] OR;  Service: Vascular;  Laterality: Left;  Left Popliteal and Tibial Embolectomy with patch angioplasty  . Dilation and curettage of uterus   . Multiple  tooth extractions   . Amputation 03/03/2011    Procedure: AMPUTATION DIGIT;  Surgeon: Newt Minion, MD;  Location: [place];  Service: Orthopedics;  Laterality: Left;  Left Foot Amputation 4th and 5th toes at MTP joint    Family History  Problem Relation Age of Onset  . COPD Mother     alive - 31  . Hypertension Mother   . Diabetes Mother   . Stroke Mother   . Coronary artery disease Mother   . Diabetes Father     alive - 94  . Hypertension Father   . Coronary artery disease Father   . Anesthesia problems Neg Hx     History  Substance Use Topics  . Smoking status: Former Smoker -- 1.0 packs/day for 21 years    Types: Cigarettes    Quit date: 01/30/2011  . Smokeless tobacco: Never Used  . Alcohol Use: No    OB History    Grav Para Term Preterm Abortions TAB SAB Ect Mult Living   1 1  1      1       Review of Systems  All other systems reviewed and are negative.   10 Systems reviewed and are negative for acute change except as noted in the HPI.  Allergies  Aspirin and Ibuprofen  Home Medications   Current Outpatient Rx  Name Route Sig Dispense Refill  . ACETAMINOPHEN 500 MG PO TABS Oral Take 1,000 mg by mouth every 6 (six) hours as needed. For pain     . ALPRAZOLAM 1 MG PO TABS Oral Take 1 mg by mouth daily as needed. For anxiety    . DIPHENHYDRAMINE HCL 25 MG PO TABS Oral Take 25 mg by mouth at bedtime as needed. For sleep    . OMEGA-3 FATTY ACIDS 1000 MG PO CAPS Oral Take 1 g by mouth daily.    . FUROSEMIDE 20 MG PO TABS Oral Take 20 mg by mouth 3 (three) times a week. On Mondays, Wednesdays, and Fridays     . INSULIN ASPART 100 UNIT/ML [place] SOLN Subcutaneous Inject 5 Units into the skin 3 (three) times daily before meals. 1 vial 12  . INSULIN GLARGINE 100 UNIT/ML [place] SOLN Subcutaneous Inject 15 Units into the skin at bedtime. 10 mL 12  . LOSARTAN POTASSIUM 25 MG PO TABS Oral Take 12.5 mg by mouth daily.    [place] M PLUS PO TABS Oral Take 1 tablet by mouth daily.       Marland Kitchen ONDANSETRON HCL 4 MG PO TABS Oral Take 1 tablet (4 mg total) by mouth every 8 (eight) hours as needed for nausea. 20 tablet 0  . POTASSIUM CHLORIDE CRYS ER 20 MEQ PO TBCR Oral Take 0.5 tablets (10 mEq total) by mouth daily. 7 tablet 0  . SERTRALINE HCL 50 MG PO TABS Oral Take 50 mg by mouth daily.     . WARFARIN SODIUM 1 MG PO TABS Oral Take 5 mg by mouth daily. Takes at 6pm      BP 144/79  Pulse 102  Temp(Src) 98.8 F (37.1 C) (Oral)  Resp 20  Ht 5\' 1"  (1.549 m)  Wt 125 lb (56.7 kg)  BMI 23.62 kg/m2  SpO2 97%  LMP 01/11/2011  Physical Exam  Nursing note and vitals reviewed. Constitutional: She is oriented to person, place, and time. She appears well-developed and well-nourished. No distress.  HENT:  Head: Normocephalic and atraumatic.  Eyes: Conjunctivae and EOM are normal.  Neck: Normal range of motion. No tracheal deviation present.  Cardiovascular:  No murmur heard. Pulmonary/Chest: Effort normal. No respiratory distress.  Abdominal: She exhibits no distension. There is no tenderness. There is no rebound.  Musculoskeletal: She exhibits no edema.       Amputation of 4th and 5th toes  Neurological: She is oriented to person, place, and time. Coordination normal.  Skin: Skin is warm. No rash noted. No erythema.  Psychiatric: She has a normal mood and affect. Her behavior is normal.    ED Course  Procedures (including critical care time)  DIAGNOSTIC STUDIES: Oxygen Saturation is 97% on room air, normal by my interpretation.    COORDINATION OF CARE: 10:14PM EDP ordered medication: dilaudid 1 mg, novolog 12 units, 0.9 NaCl bolus    Labs Reviewed  CBC - Abnormal; Notable for the following:    WBC 10.9 (*)    RBC 3.76 (*)    Hemoglobin 11.5 (*)    HCT 34.0 (*)    Platelets 443 (*)    All other components within normal limits  BASIC METABOLIC PANEL - Abnormal; Notable for the following:    Sodium 128 (*)    Calcium 7.6 (*)    GFR calc non Af Amer 85 (*)    All  other components within  normal limits  GLUCOSE, CAPILLARY - Abnormal; Notable for the following:    Glucose-Capillary 506 (*)    All other components within normal limits  DIFFERENTIAL   No results found.   No diagnosis found.    MDM   The chart was scribed for me under my direct supervision.  I personally performed the history, physical, and medical decision making and all procedures in the evaluation of this patient.Marland Kitchen   0030 Assumed care/disposition of patient. She has received 2 liters of IVF and two insulin injections. Awaiting repeat glucose at one hour post injection.Glucose 251. Spoke with patient who asked reason for her facial swelling. Patient reports facial swelling that began about the same time she had surgery to her foot. Swelling occurs overnight and subsides during the day. Does not interfere with swallowing, breathing or speech. She has swelling to her cheeks, chin, neck and periorbital. She denies NSAID use, ACE use. She is on coumadin.Collins with radiology re SVC study. CT with contrast ordered. 0310 CT negative for  Acute findings. 0345 Reviewed results with patient. Glucose 114. Ordered snack. Pt stable in ED with no significant deterioration in condition.Discussed follow up with Dr. Luan Pulling for further work up of facial swelling at night. The patient appears reasonably screened and/or stabilized for discharge and I doubt any other medical condition or other [facility] requiring further screening, evaluation, or treatment in the ED at this time prior to discharge.  Ct Chest W Contrast  03/12/2011  *RADIOLOGY REPORT*  Clinical Data: Facial swelling; history of diabetes and smoking. Assess for SVC syndrome.  CT CHEST WITH CONTRAST  Technique:  Multidetector CT imaging of the chest was performed following the standard protocol during bolus administration of intravenous contrast.  Contrast: 18mL OMNIPAQUE IOHEXOL 300 MG/ML IV SOLN  Comparison: Chest radiograph performed 01/12  1013, and CTA of the chest performed 11/29/2008  Findings: There is no evidence of SVC syndrome.  The superior vena cava appears fully patent.  There is significant narrowing of the left jugular vein just proximal to the left brachiocephalic vein, without evidence of occlusion.  Diffuse soft tissue edema is noted along the chest and abdominal wall, compatible with anasarca.  Minimal scarring or atelectasis is noted at the right lung base. The lungs are otherwise clear.  There is no evidence of focal consolidation, pleural effusion or pneumothorax.  No masses are seen.  The mediastinum is unremarkable in appearance.  Scattered small mediastinal nodes remain normal in size.  There is no evidence of mediastinal lymphadenopathy.  No pericardial effusion identified. The great vessels are unremarkable in appearance.  The thyroid gland is within normal limits.  No axillary lymphadenopathy is seen.  Small volume abdominal ascites is noted.  Mild periportal edema is noted.  The liver is otherwise unremarkable in appearance.  The spleen is within normal limits.  The gallbladder is relatively contracted and grossly unremarkable.  The pancreas is difficult to fully assess due to surrounding edema, but appears grossly unremarkable.  A 1.4 cm cyst is noted at the upper pole of the right kidney.  The kidneys are otherwise unremarkable in appearance.  Diffuse mesenteric edema is noted, with diffuse prominence of the mesenteric vasculature.  Scattered calcification is noted along the abdominal aorta and its branches.  No acute osseous abnormalities are identified.  IMPRESSION:  1.  No evidence of SVC syndrome.  The superior vena cava appears patent; there is significant narrowing of the left jugular vein just proximal to the left brachiocephalic vein,  without evidence of occlusion. 2.  Significant diffuse soft tissue edema along the chest abdominal wall, compatible with anasarca. 3.  Small volume abdominal ascites noted. 4.  Diffuse  mesenteric edema noted, with diffuse prominence of the mesenteric vasculature. 5.  Mild periportal edema noted; diffuse soft tissue edema noted within the abdomen. 6.  Minimal scarring or atelectasis at the right lung base; lungs otherwise clear. 7.  Right renal cyst noted. 8.  Scattered calcification along the abdominal aorta and its branches.  Original Report Authenticated By: Santa Lighter, M.D.    Gypsy Balsam. Olin Hauser, MD 03/12/11 [id number]

## 2011-03-11 NOTE — ED Notes (Signed)
Attempted x 3 for IV access without success. 

## 2011-03-11 NOTE — ED Notes (Addendum)
Pt here for phantom pain to last 2 toes of left foot since amputation 8 days ago, states that she was given a 10 day supply of pain meds but has ran out, pt states that she attempted to call MD, dsg noted to left foot, pt states that she has an appt soon for wound recheck and was told not to remove the dsg on now til F/U appt

## 2011-03-11 NOTE — ED Notes (Signed)
Ran out of pain meds, had 2 toes amputated 8 days ago, EMS reports that CBG meter read HIGH

## 2011-03-11 NOTE — ED Notes (Signed)
CRITICAL VALUE ALERT  Critical value received: glucose 599  Date of notification:  03/11/11  Time of notification:  2300  Critical value read back:yes  Nurse who received alert:  B. Olena Heckle, RN  MD notified (1st page):  [place]  Time of first page:  2255  MD notified (2nd page):2300  Time of second page:  Responding MD:  [place]  Time MD responded:  2259

## 2011-03-12 ENCOUNTER — Emergency Department (HOSPITAL_COMMUNITY): Payer: Medicaid Other

## 2011-03-12 LAB — GLUCOSE, CAPILLARY
Glucose-Capillary: 251 mg/dL — ABNORMAL HIGH (ref 70–99)
Glucose-Capillary: 114 mg/dL — ABNORMAL HIGH (ref 70–99)
Glucose-Capillary: 94 mg/dL (ref 70–99)

## 2011-03-12 MED ORDER — HYDROMORPHONE HCL PF 1 MG/ML IJ SOLN
1.0000 mg | Freq: Once | INTRAMUSCULAR | Status: AC
  Administered 2011-03-12: 1 mg via INTRAMUSCULAR
  Filled 2011-03-12: qty 1

## 2011-03-12 MED ORDER — SODIUM CHLORIDE 0.9 % IV BOLUS (SEPSIS)
1000.0000 mL | Freq: Once | INTRAVENOUS | Status: AC
  Administered 2011-03-12: 1000 mL via INTRAVENOUS

## 2011-03-12 MED ORDER — OXYCODONE-ACETAMINOPHEN 5-325 MG PO TABS: 1.0000 | ORAL_TABLET | Freq: Four times a day (QID) | ORAL | Status: AC | PRN

## 2011-03-12 MED ORDER — HYDROMORPHONE HCL PF 1 MG/ML IJ SOLN
1.0000 mg | Freq: Once | INTRAMUSCULAR | Status: AC
  Administered 2011-03-12: 1 mg via INTRAVENOUS
  Filled 2011-03-12: qty 1

## 2011-03-12 MED ORDER — IOHEXOL 300 MG/ML  SOLN
100.0000 mL | Freq: Once | INTRAMUSCULAR | Status: AC | PRN
  Administered 2011-03-12: 100 mL via INTRAVENOUS

## 2011-03-12 MED ORDER — INSULIN ASPART 100 UNIT/ML ~~LOC~~ SOLN
10.0000 [IU] | Freq: Once | SUBCUTANEOUS | Status: AC
  Administered 2011-03-12: 10 [IU] via SUBCUTANEOUS
  Filled 2011-03-12: qty 1

## 2011-03-12 MED ORDER — DIPHENHYDRAMINE HCL 25 MG PO CAPS
25.0000 mg | ORAL_CAPSULE | Freq: Once | ORAL | Status: AC
  Administered 2011-03-12: 25 mg via ORAL
  Filled 2011-03-12: qty 1

## 2011-03-12 NOTE — ED Notes (Signed)
Patient to CT scan via stretcher.

## 2011-03-12 NOTE — Discharge Instructions (Signed)
Follow up with your md next week to check sugars.  Follow up regarding the facial swelling. Use a benadryl at night to see if will help with the swelling. Follow up with dr. Sharol Given as scheduled

## 2011-03-12 NOTE — ED Notes (Signed)
Patient returned from CT scan via stretcher.

## 2011-03-22 LAB — PROTIME-INR

## 2011-03-27 ENCOUNTER — Inpatient Hospital Stay (HOSPITAL_COMMUNITY)
Admission: EM | Admit: 2011-03-27 | Discharge: 2011-03-30 | DRG: 916 | Disposition: A | Discharge status: Home Health | Payer: Medicaid Other | Attending: Emergency Medicine | Admitting: Emergency Medicine

## 2011-03-27 ENCOUNTER — Encounter (HOSPITAL_COMMUNITY): Payer: Self-pay | Admitting: Emergency Medicine

## 2011-03-27 DIAGNOSIS — Z79899 Other long term (current) drug therapy: Secondary | ICD-10-CM

## 2011-03-27 DIAGNOSIS — N049 Nephrotic syndrome with unspecified morphologic changes: Secondary | ICD-10-CM | POA: Diagnosis present

## 2011-03-27 DIAGNOSIS — Z7901 Long term (current) use of anticoagulants: Secondary | ICD-10-CM

## 2011-03-27 DIAGNOSIS — I70269 Atherosclerosis of native arteries of extremities with gangrene, unspecified extremity: Secondary | ICD-10-CM | POA: Diagnosis present

## 2011-03-27 DIAGNOSIS — Z794 Long term (current) use of insulin: Secondary | ICD-10-CM

## 2011-03-27 DIAGNOSIS — T46905A Adverse effect of unspecified agents primarily affecting the cardiovascular system, initial encounter: Secondary | ICD-10-CM | POA: Diagnosis present

## 2011-03-27 DIAGNOSIS — E11621 Type 2 diabetes mellitus with foot ulcer: Secondary | ICD-10-CM

## 2011-03-27 DIAGNOSIS — N059 Unspecified nephritic syndrome with unspecified morphologic changes: Secondary | ICD-10-CM | POA: Diagnosis present

## 2011-03-27 DIAGNOSIS — L0201 Cutaneous abscess of face: Secondary | ICD-10-CM

## 2011-03-27 DIAGNOSIS — S98139A Complete traumatic amputation of one unspecified lesser toe, initial encounter: Secondary | ICD-10-CM

## 2011-03-27 DIAGNOSIS — F17200 Nicotine dependence, unspecified, uncomplicated: Secondary | ICD-10-CM | POA: Diagnosis present

## 2011-03-27 DIAGNOSIS — E1065 Type 1 diabetes mellitus with hyperglycemia: Secondary | ICD-10-CM | POA: Diagnosis present

## 2011-03-27 DIAGNOSIS — T783XXA Angioneurotic edema, initial encounter: Principal | ICD-10-CM | POA: Diagnosis present

## 2011-03-27 DIAGNOSIS — R220 Localized swelling, mass and lump, head: Secondary | ICD-10-CM

## 2011-03-27 DIAGNOSIS — F341 Dysthymic disorder: Secondary | ICD-10-CM | POA: Diagnosis present

## 2011-03-27 DIAGNOSIS — E1029 Type 1 diabetes mellitus with other diabetic kidney complication: Secondary | ICD-10-CM | POA: Diagnosis present

## 2011-03-27 DIAGNOSIS — L97509 Non-pressure chronic ulcer of other part of unspecified foot with unspecified severity: Secondary | ICD-10-CM

## 2011-03-27 DIAGNOSIS — E785 Hyperlipidemia, unspecified: Secondary | ICD-10-CM | POA: Diagnosis present

## 2011-03-27 LAB — DIFFERENTIAL
Basophils Absolute: 0.1 10*3/uL (ref 0.0–0.1)
Basophils Relative: 1 % (ref 0–1)
Eosinophils Absolute: 0.2 10*3/uL (ref 0.0–0.7)
Eosinophils Relative: 2 % (ref 0–5)
Lymphocytes Relative: 40 % (ref 12–46)
Lymphs Abs: 3.3 10*3/uL (ref 0.7–4.0)
Monocytes Absolute: 0.4 10*3/uL (ref 0.1–1.0)
Monocytes Relative: 5 % (ref 3–12)
Neutro Abs: 4.4 10*3/uL (ref 1.7–7.7)
Neutrophils Relative %: 52 % (ref 43–77)

## 2011-03-27 LAB — PROTIME-INR
INR: 1.47 (ref 0.00–1.49)
Prothrombin Time: 18.1 seconds — ABNORMAL HIGH (ref 11.6–15.2)

## 2011-03-27 LAB — CBC
HCT: 36.9 % (ref 36.0–46.0)
Hemoglobin: 12.7 g/dL (ref 12.0–15.0)
MCH: 30.6 pg (ref 26.0–34.0)
MCHC: 34.4 g/dL (ref 30.0–36.0)
MCV: 88.9 fL (ref 78.0–100.0)
Platelets: 472 10*3/uL — ABNORMAL HIGH (ref 150–400)
RBC: 4.15 MIL/uL (ref 3.87–5.11)
RDW: 14.3 % (ref 11.5–15.5)
WBC: 8.4 10*3/uL (ref 4.0–10.5)

## 2011-03-27 LAB — BASIC METABOLIC PANEL
BUN: 11 mg/dL (ref 6–23)
CO2: 27 mEq/L (ref 19–32)
Calcium: 8.4 mg/dL (ref 8.4–10.5)
Chloride: 102 mEq/L (ref 96–112)
Creatinine, Ser: 0.76 mg/dL (ref 0.50–1.10)
GFR calc Af Amer: >90 mL/min (ref >90)
GFR calc non Af Amer: >90 mL/min (ref >90)
Glucose, Bld: 133 mg/dL — ABNORMAL HIGH (ref 70–99)
Potassium: 4 mEq/L (ref 3.5–5.1)
Sodium: 135 mEq/L (ref 135–145)

## 2011-03-27 LAB — GLUCOSE, CAPILLARY
Glucose-Capillary: 541 mg/dL — ABNORMAL HIGH (ref 70–99)
Glucose-Capillary: >600 mg/dL (ref 70–99)

## 2011-03-27 LAB — GLUCOSE, RANDOM: Glucose, Bld: 657 mg/dL (ref 70–99)

## 2011-03-27 MED ORDER — OMEGA-3-ACID ETHYL ESTERS 1 G PO CAPS
1.0000 g | ORAL_CAPSULE | Freq: Every day | ORAL | Status: DC
  Administered 2011-03-27 – 2011-03-29 (×3): 1 g via ORAL
  Filled 2011-03-27 (×3): qty 1

## 2011-03-27 MED ORDER — SODIUM CHLORIDE 0.9 % IV SOLN
INTRAVENOUS | Status: DC
  Administered 2011-03-28 (×2): via INTRAVENOUS

## 2011-03-27 MED ORDER — DIPHENHYDRAMINE HCL 50 MG/ML IJ SOLN
25.0000 mg | Freq: Once | INTRAMUSCULAR | Status: AC
  Administered 2011-03-27: 25 mg via INTRAVENOUS
  Filled 2011-03-27: qty 1

## 2011-03-27 MED ORDER — METHYLPREDNISOLONE SODIUM SUCC 125 MG IJ SOLR
125.0000 mg | Freq: Once | INTRAMUSCULAR | Status: AC
  Administered 2011-03-27: 125 mg via INTRAVENOUS
  Filled 2011-03-27: qty 2

## 2011-03-27 MED ORDER — ALPRAZOLAM 1 MG PO TABS
1.0000 mg | ORAL_TABLET | Freq: Three times a day (TID) | ORAL | Status: DC | PRN
  Administered 2011-03-28: 1 mg via ORAL
  Filled 2011-03-27: qty 1

## 2011-03-27 MED ORDER — POTASSIUM CHLORIDE CRYS ER 10 MEQ PO TBCR
10.0000 meq | EXTENDED_RELEASE_TABLET | Freq: Every day | ORAL | Status: DC
  Administered 2011-03-27 – 2011-03-29 (×3): 10 meq via ORAL
  Filled 2011-03-27 (×3): qty 1

## 2011-03-27 MED ORDER — OXYCODONE-ACETAMINOPHEN 5-325 MG PO TABS
2.0000 | ORAL_TABLET | Freq: Three times a day (TID) | ORAL | Status: DC | PRN
  Administered 2011-03-27 – 2011-03-28 (×2): 2 via ORAL
  Filled 2011-03-27 (×2): qty 2

## 2011-03-27 MED ORDER — FUROSEMIDE 20 MG PO TABS
20.0000 mg | ORAL_TABLET | ORAL | Status: DC
  Administered 2011-03-29: 20 mg via ORAL
  Filled 2011-03-27: qty 1

## 2011-03-27 MED ORDER — METHYLPREDNISOLONE SODIUM SUCC 125 MG IJ SOLR
125.0000 mg | Freq: Three times a day (TID) | INTRAMUSCULAR | Status: DC
  Administered 2011-03-27 – 2011-03-30 (×8): 125 mg via INTRAVENOUS
  Filled 2011-03-27 (×8): qty 2

## 2011-03-27 MED ORDER — DIPHENHYDRAMINE HCL 25 MG PO CAPS
25.0000 mg | ORAL_CAPSULE | Freq: Two times a day (BID) | ORAL | Status: DC
  Administered 2011-03-27 – 2011-03-29 (×5): 25 mg via ORAL
  Filled 2011-03-27 (×5): qty 1

## 2011-03-27 MED ORDER — HYDROMORPHONE HCL PF 1 MG/ML IJ SOLN
1.0000 mg | Freq: Once | INTRAMUSCULAR | Status: AC
  Administered 2011-03-27: 1 mg via INTRAVENOUS
  Filled 2011-03-27: qty 1

## 2011-03-27 MED ORDER — INSULIN REGULAR BOLUS VIA INFUSION
0.0000 [IU] | Freq: Three times a day (TID) | INTRAVENOUS | Status: DC
  Filled 2011-03-27: qty 10

## 2011-03-27 MED ORDER — WARFARIN - PHARMACIST DOSING INPATIENT: Freq: Every day | Status: DC

## 2011-03-27 MED ORDER — FAMOTIDINE IN NACL 20-0.9 MG/50ML-% IV SOLN
20.0000 mg | Freq: Once | INTRAVENOUS | Status: AC
  Administered 2011-03-27: 20 mg via INTRAVENOUS
  Filled 2011-03-27: qty 50

## 2011-03-27 MED ORDER — INSULIN REGULAR HUMAN 100 UNIT/ML IJ SOLN
INTRAMUSCULAR | Status: AC
  Filled 2011-03-27: qty 3

## 2011-03-27 MED ORDER — INSULIN ASPART 100 UNIT/ML ~~LOC~~ SOLN
0.0000 [IU] | Freq: Three times a day (TID) | SUBCUTANEOUS | Status: DC
  Filled 2011-03-27: qty 3

## 2011-03-27 MED ORDER — INSULIN ASPART 100 UNIT/ML ~~LOC~~ SOLN: 0.0000 [IU] | Freq: Every day | SUBCUTANEOUS | Status: DC

## 2011-03-27 MED ORDER — DEXTROSE 50 % IV SOLN: 25.0000 mL | INTRAVENOUS | Status: DC | PRN

## 2011-03-27 MED ORDER — DEXTROSE-NACL 5-0.45 % IV SOLN
INTRAVENOUS | Status: DC
  Administered 2011-03-28 – 2011-03-30 (×4): via INTRAVENOUS

## 2011-03-27 MED ORDER — ADULT MULTIVITAMIN W/MINERALS CH
1.0000 | ORAL_TABLET | Freq: Every day | ORAL | Status: DC
  Administered 2011-03-27 – 2011-03-29 (×3): 1 via ORAL
  Filled 2011-03-27 (×3): qty 1

## 2011-03-27 MED ORDER — WARFARIN SODIUM 7.5 MG PO TABS
7.5000 mg | ORAL_TABLET | ORAL | Status: AC
  Administered 2011-03-27: 7.5 mg via ORAL
  Filled 2011-03-27: qty 1

## 2011-03-27 MED ORDER — INSULIN REGULAR HUMAN 100 UNIT/ML IJ SOLN
INTRAMUSCULAR | Status: DC
  Administered 2011-03-28: 2 [IU]/h via INTRAVENOUS
  Administered 2011-03-28: 5.4 [IU]/h via INTRAVENOUS
  Filled 2011-03-27 (×3): qty 1

## 2011-03-27 MED ORDER — INSULIN GLARGINE 100 UNIT/ML ~~LOC~~ SOLN: 15.0000 [IU] | Freq: Every day | SUBCUTANEOUS | Status: DC

## 2011-03-27 MED ORDER — SERTRALINE HCL 50 MG PO TABS
50.0000 mg | ORAL_TABLET | Freq: Every day | ORAL | Status: DC
  Administered 2011-03-27 – 2011-03-29 (×3): 50 mg via ORAL
  Filled 2011-03-27 (×3): qty 1

## 2011-03-27 MED ORDER — ONDANSETRON HCL 4 MG/2ML IJ SOLN
4.0000 mg | Freq: Once | INTRAMUSCULAR | Status: AC
  Administered 2011-03-27: 4 mg via INTRAVENOUS
  Filled 2011-03-27: qty 2

## 2011-03-27 NOTE — ED Notes (Signed)
Pt given drink and crackers  

## 2011-03-27 NOTE — Progress Notes (Signed)
Received report from [place], [place] in ED

## 2011-03-27 NOTE — ED Notes (Signed)
Pt states she has been having an allergic reaction to something that is causing her face to swell, sob and having trouble swallowing. Pt denies any change is food or medications.

## 2011-03-27 NOTE — ED Provider Notes (Signed)
History     CSN: [id number]  Arrival date & time 03/27/11  36   First MD Initiated Contact with Patient 03/27/11 1339      Chief Complaint  Patient presents with  . Facial Swelling  . Allergic Reaction    (Consider location/radiation/quality/duration/timing/severity/associated sxs/prior treatment) HPI [name] is a 41 y.o. 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 She states she has taken benadryl with no change. Facial swelling has occurred on two previous occasions, 12/2010 and more recently 03/12/11. There has been no cause found and no current work up is ongoing. She reports that today she feels it is more difficult to swallow and her throat feels scratchy. She states the most recent episodes of swelling have occurred since her surgery. When seen on 03/12/11 for foot pain, work up for possible SVC syndrome with a negative CT. In addition, she is continuing to have pain to the left foot. She has had a recent fall and has abrasion to lateral right toe.   PCP  Dr. Luan Pulling Orthopedist Dr. Sharol Given    Past Medical History  Diagnosis Date  . Shingles     11/2010  . Cellulitis     of face - 12/2010  . Peripheral vascular disease in diabetes mellitus   . Hyperlipidemia   . Tobacco abuse     0.5 - 1ppd x 21 yrs  . Headache   . Renal disorder     glomerulonephritis  . Blood transfusion   . Anxiety   . Depression   . Peripheral vascular disease   . Diabetes mellitus     IDDM Since Age 89    Past Surgical History  Procedure Date  . Wrist surgery     left  . Embolectomy 01/29/2011    Procedure: EMBOLECTOMY;  Surgeon: Mal Misty, MD;  Location: [facility] OR;  Service: Vascular;  Laterality: Left;  Left Popliteal and Tibial Embolectomy with patch angioplasty  . Dilation and curettage of uterus   . Multiple tooth extractions   . Amputation  03/03/2011    Procedure: AMPUTATION DIGIT;  Surgeon: Newt Minion, MD;  Location: [place];  Service: Orthopedics;  Laterality: Left;  Left Foot Amputation 4th and 5th toes at MTP joint    Family History  Problem Relation Age of Onset  . COPD Mother     alive - 67  . Hypertension Mother   . Diabetes Mother   . Stroke Mother   . Coronary artery disease Mother   . Diabetes Father     alive - 90  . Hypertension Father   . Coronary artery disease Father   . Anesthesia problems Neg Hx     History  Substance Use Topics  . Smoking status: Passive Smoker -- 1.0 packs/day for 21 years    Types: Cigarettes    Last Attempt to Quit: 01/30/2011  . Smokeless tobacco: Never Used  . Alcohol Use: No    OB History    Grav Para Term Preterm Abortions TAB SAB Ect Mult Living   1 1  1      1       Review of Systems A 10 review of systems reviewed and are negative for acute change except as noted in the HPI. Allergies  Aspirin and Ibuprofen  Home Medications   Current Outpatient Rx  Name Route  Sig Dispense Refill  . ALPRAZOLAM 1 MG PO TABS Oral Take 1 mg by mouth daily as needed. For anxiety    . DIPHENHYDRAMINE HCL 25 MG PO TABS Oral Take 25 mg by mouth at bedtime as needed. For sleep    . OMEGA-3 FATTY ACIDS 1000 MG PO CAPS Oral Take 1 g by mouth daily.    . FUROSEMIDE 20 MG PO TABS Oral Take 20 mg by mouth 3 (three) times a week. On Mondays, Wednesdays, and Fridays     . INSULIN ASPART 100 UNIT/ML [place] SOLN Subcutaneous Inject 5 Units into the skin 3 (three) times daily before meals. 1 vial 12  . INSULIN GLARGINE 100 UNIT/ML [place] SOLN Subcutaneous Inject 15 Units into the skin at bedtime. 10 mL 12  . LOSARTAN POTASSIUM 25 MG PO TABS Oral Take 12.5 mg by mouth daily.    Creed Copper M PLUS PO TABS Oral Take 1 tablet by mouth daily.      Marland Kitchen POTASSIUM CHLORIDE CRYS ER 20 MEQ PO TBCR Oral Take 0.5 tablets (10 mEq total) by mouth daily. 7 tablet 0  . SERTRALINE HCL 50 MG PO TABS Oral Take 50 mg by  mouth daily.     . WARFARIN SODIUM 1 MG PO TABS Oral Take 7 mg by mouth daily. Takes at 6pm    . ACETAMINOPHEN 500 MG PO TABS Oral Take 1,000 mg by mouth every 6 (six) hours as needed. For pain       BP 131/92  Pulse 122  Temp(Src) 98.4 F (36.9 C) (Oral)  Resp 20  Ht 5\' 1"  (1.549 m)  Wt 135 lb (61.236 kg)  BMI 25.51 kg/m2  SpO2 96%  LMP 12/28/2010  Physical Exam Physical examination:  Nursing notes reviewed; Vital signs and O2 SAT reviewed;  Constitutional: Well developed, Well nourished, Well hydrated,anxious; Head:  Normocephalic, atraumatic, swelling to chin, neck and face,; Eyes: EOMI, PERRL, No scleral icterus, periorbital edema; ENMT: Mouth and pharynx normal, Mucous membranes moist, uvula midline, tongue normal ; Neck: Supple, Full range of motion, No lymphadenopathy, no stidor; Cardiovascular: Regular rate and rhythm, No murmur, rub, or gallop; Respiratory: Breath sounds clear & equal bilaterally, No rales, rhonchi, wheezes, or rub, Normal respiratory effort/excursion; Chest: Nontender, Movement normal; Abdomen: Soft, Nontender, Nondistended, Normal bowel sounds; Genitourinary: No CVA tenderness; Extremities: Left foot with palpalbe DP, Tenderness to foot around the operative site. Amputation site clean and dry, steri strips in place, no bleeding or drainage, skin dry and flaking. Great toe on left with ischemic discoloration to tip.  No edema, No calf edema or asymmetry.; Neuro: AA&Ox3, Major CN grossly intact.  No gross focal motor deficits in extremities, decreased sensation to both feet in stocking distribution..; Skin: Color normal, Warm, Dry, except for left foot.  ED Course  Procedures (including critical care time) Results for orders placed during the hospital encounter of 03/27/11  CBC      Component Value Range   WBC 8.4  4.0 - 10.5 (K/uL)   RBC 4.15  3.87 - 5.11 (MIL/uL)   Hemoglobin 12.7  12.0 - 15.0 (g/dL)   HCT 36.9  36.0 - 46.0 (%)   MCV 88.9  78.0 - 100.0 (fL)    MCH 30.6  26.0 - 34.0 (pg)   MCHC 34.4  30.0 - 36.0 (g/dL)   RDW 14.3  11.5 - 15.5 (%)   Platelets 472 (*) 150 - 400 (K/uL)  DIFFERENTIAL      Component Value Range  Neutrophils Relative 52  43 - 77 (%)   Neutro Abs 4.4  1.7 - 7.7 (K/uL)   Lymphocytes Relative 40  12 - 46 (%)   Lymphs Abs 3.3  0.7 - 4.0 (K/uL)   Monocytes Relative 5  3 - 12 (%)   Monocytes Absolute 0.4  0.1 - 1.0 (K/uL)   Eosinophils Relative 2  0 - 5 (%)   Eosinophils Absolute 0.2  0.0 - 0.7 (K/uL)   Basophils Relative 1  0 - 1 (%)   Basophils Absolute 0.1  0.0 - 0.1 (K/uL)  BASIC METABOLIC PANEL      Component Value Range   Sodium 135  135 - 145 (mEq/L)   Potassium 4.0  3.5 - 5.1 (mEq/L)   Chloride 102  96 - 112 (mEq/L)   CO2 27  19 - 32 (mEq/L)   Glucose, Bld 133 (*) 70 - 99 (mg/dL)   BUN 11  6 - 23 (mg/dL)   Creatinine, Ser 0.76  0.50 - 1.10 (mg/dL)   Calcium 8.4  8.4 - 10.5 (mg/dL)   GFR calc non Af Amer >90  >90 (mL/min)   GFR calc Af Amer >90  >90 (mL/min)  PROTIME-INR      Component Value Range   Prothrombin Time 18.1 (*) 11.6 - 15.2 (seconds)   INR 1.47  0.00 - 1.49   1558 Spoke with Dr. Legrand Rams who will admit the patient.  MDM  Patient with h/o DM, PVD, recent amputation of toes, here with recurrent facial swelling of unknown origin. Work up for SVC syndrome or DVT negative on 03/12/11. It did however, in the setting of facial edema, show small volume ascites and diffuse mesenteric edema. Patient does remain on Losartan which could have angioedema side effect. Given IVF, pepcid, solumedrol, benadryl, analgesic and antiemetic. Patient has remained without airway compromise. Left foot is worrisome for ischemic changes to the great toe that may result in amputation.Will arranage for admission.Pt stable in ED with no significant deterioration in condition.The patient appears reasonably stabilized for admission considering the current resources, flow, and capabilities available in the ED at this time, and I  doubt any other [facility] requiring further screening and/or treatment in the ED prior to admission.  MDM Reviewed: previous chart, nursing note and vitals Reviewed previous: labs and CT scan Interpretation: labs         Gypsy Balsam. Olin Hauser, MD 03/27/11 1558

## 2011-03-28 LAB — PROTIME-INR
INR: 1.48 (ref 0.00–1.49)
Prothrombin Time: 18.2 seconds — ABNORMAL HIGH (ref 11.6–15.2)

## 2011-03-28 LAB — GLUCOSE, CAPILLARY
Glucose-Capillary: 117 mg/dL — ABNORMAL HIGH (ref 70–99)
Glucose-Capillary: 121 mg/dL — ABNORMAL HIGH (ref 70–99)
Glucose-Capillary: 123 mg/dL — ABNORMAL HIGH (ref 70–99)
Glucose-Capillary: 136 mg/dL — ABNORMAL HIGH (ref 70–99)
Glucose-Capillary: 140 mg/dL — ABNORMAL HIGH (ref 70–99)
Glucose-Capillary: 142 mg/dL — ABNORMAL HIGH (ref 70–99)
Glucose-Capillary: 160 mg/dL — ABNORMAL HIGH (ref 70–99)
Glucose-Capillary: 161 mg/dL — ABNORMAL HIGH (ref 70–99)
Glucose-Capillary: 164 mg/dL — ABNORMAL HIGH (ref 70–99)
Glucose-Capillary: 165 mg/dL — ABNORMAL HIGH (ref 70–99)
Glucose-Capillary: 186 mg/dL — ABNORMAL HIGH (ref 70–99)
Glucose-Capillary: 186 mg/dL — ABNORMAL HIGH (ref 70–99)
Glucose-Capillary: 213 mg/dL — ABNORMAL HIGH (ref 70–99)
Glucose-Capillary: 283 mg/dL — ABNORMAL HIGH (ref 70–99)
Glucose-Capillary: 379 mg/dL — ABNORMAL HIGH (ref 70–99)
Glucose-Capillary: 426 mg/dL — ABNORMAL HIGH (ref 70–99)
Glucose-Capillary: 477 mg/dL — ABNORMAL HIGH (ref 70–99)
Glucose-Capillary: 595 mg/dL (ref 70–99)
Glucose-Capillary: 90 mg/dL (ref 70–99)
Glucose-Capillary: 93 mg/dL (ref 70–99)
Glucose-Capillary: >600 mg/dL (ref 70–99)

## 2011-03-28 LAB — MRSA PCR SCREENING: MRSA by PCR: NEGATIVE

## 2011-03-28 MED ORDER — OXYCODONE-ACETAMINOPHEN 5-325 MG PO TABS
2.0000 | ORAL_TABLET | ORAL | Status: DC | PRN
  Administered 2011-03-28 – 2011-03-30 (×7): 2 via ORAL
  Filled 2011-03-28 (×7): qty 2

## 2011-03-28 MED ORDER — WARFARIN SODIUM 7.5 MG PO TABS
7.5000 mg | ORAL_TABLET | ORAL | Status: AC
  Administered 2011-03-28: 7.5 mg via ORAL
  Filled 2011-03-28: qty 1

## 2011-03-28 NOTE — Progress Notes (Signed)
[name], [name]                ACCOUNT NO.:  [id number]  MEDICAL RECORD NO.:  [id number]  LOCATION:  IC11                          FACILITY:  [facility]  PHYSICIAN:  Montreal Steidle D. Legrand Rams, MD   DATE OF BIRTH:  [birth year]  DATE OF PROCEDURE:  03/28/2011 DATE OF DISCHARGE:                                PROGRESS NOTE   SUBJECTIVE:  The patient feels better.  Her facial swelling is improving.  However, the patient developed severe hyperglycemia requiring insulin drip.  She is currently in ICU and being evaluated by the Endocrine.  OBJECTIVE:  GENERAL/VITAL SIGNS:  The patient is alert, awake, and resting with vitals blood pressure 131/54, pulse 81, respiratory rate 11, temperature 97 degrees Fahrenheit. CHEST:  Clear lung fields, good air entry. CARDIOVASCULAR SYSTEM:  First and second heart sounds heard.  No murmur, no gallop. ABDOMEN:  Soft and lax.  Bowel sound is positive.  No mass or organomegaly. EXTREMITIES:  No leg edema.  ASSESSMENT: 1. Angioneurotic edema, clinically improving. 2. Diabetes mellitus with severe hyperglycemia. 3. History of peripheral vascular disease and status post amputation. 4. Anxiety depression disorder.  PLAN:  Continue the patient on IV steroid.  Continue insulin therapy. Continue current treatment and supportive care.     Krystine Pabst D. Legrand Rams, MD     TDF/MEDQ  D:  03/28/2011  T:  03/28/2011  Job:  [id number]

## 2011-03-28 NOTE — H&P (Signed)
[name], [name]                ACCOUNT NO.:  [id number]  MEDICAL RECORD NO.:  [id number]  LOCATION:  IC11                          FACILITY:  [facility]  PHYSICIAN:  Dealie Koelzer D. Legrand Rams, MD   DATE OF BIRTH:  05-24-1970  DATE OF ADMISSION:  03/27/2011 DATE OF DISCHARGE:  LH                             HISTORY & PHYSICAL   CHIEF COMPLAINT:  Swelling of the face, tongue, and neck.  HISTORY OF PRESENT ILLNESS:  This is a 41 year old female patient of Dr. Luan Pulling who came to emergency room with above complaint.  The patient recently had amputation of the left fourth and fifth toe secondary to gangrene and peripheral vascular disease.  She has been followed with her surgeon in [place].  However, she developed sudden swelling of her face and neck.  She tried to take Benadryl, but no changes.  The patient had a similar episode in the past.  She is currently on losartan for her blood pressure.  There is no any other new medication she has been started.  The patient came to emergency room and was started on IV steroid and Benadryl.  Her losartan has been stopped and the patient was admitted for further evaluation and treatment.  REVIEW OF SYSTEMS:  The patient has no fever, chills, cough, nausea, vomiting, abdominal pain, dysuria, urgency, or frequency of urination.  PAST MEDICAL HISTORY: 1. Diabetes mellitus. 2. Peripheral vascular disease. 3. History of left fourth and fifth toe gangrene and status post     amputation. 4. Hyperlipidemia. 5. Tobacco abuse. 6. Anxiety depression disorder.  CURRENT MEDICATIONS: 1. Xanax 1 mg p.r.n. 2. Benadryl 25 mg at bedtime. 3. Insulin NovoLog 5 units t.i.d. 4. Losartan 12.5 mg daily. 5. Coumadin 1 mg daily. 6. Acetaminophen 1000 mg q.6 hours p.r.n. 7. Fish oil 1 g daily. 8. Lasix 20 mg daily. 9. Lantus insulin 15 units at daily. 10.Multivitamin 1 tablet daily. 11.K-Dur 20 mEq daily. 12.Zoloft 50 mg daily.  SOCIAL HISTORY:  The patient claims  she is trying to stop tobacco smoking.  No history of alcohol or substance abuse.  FAMILY HISTORY:  Her mother has chronic obstructive pulmonary disease, hypertension, and diabetes.  Her father has diabetes and hypertension.  PHYSICAL EXAMINATION:  GENERAL:  The patient is alert, awake, and sick looking. VITAL SIGNS:  Blood pressure 130/82, pulse 81, respiratory rate 20, temperature 97.7 degrees Fahrenheit. HEENT:  The patient has puffy face with some swelling of her lips. There is no stridor or breathing problem.  No dysphagia. NECK:  Supple. CHEST:  Decreased air entry, few rhonchi. CARDIOVASCULAR SYSTEM:  First and second heart sound heard.  No murmur. No gallop. ABDOMEN:  Soft and lax.  Bowel sound is positive.  No mass or organomegaly.  EXTREMITIES:  The patient has amputation of the fourth and fifth left toe.  There is small gangrenous change on the left first toe.  LABS ON ADMISSION:  CBC:  WBC 8.4, hemoglobin 12.1, hematocrit 36.9, and platelet 272,000.  CBC:  WBC 135, potassium 4.0, chloride 102, carbon dioxide 27, glucose 133, BUN 11, creatinine 0.76.  ASSESSMENT: 1. Probably angioneurotic edema secondary to losartan. 2. History of peripheral vascular disease  and gangrene of the left     fourth and fifth toe and status post amputation. 3. Diabetes mellitus. 4. Hypertension.  PLAN:  We will discontinue losartan.  We will continue on IV steroid. We will continue to closely monitor the patient and continue regular treatment.     Minnetta Sandora D. Legrand Rams, MD     TDF/MEDQ  D:  03/27/2011  T:  03/28/2011  Job:  [id number]

## 2011-03-28 NOTE — Consult Note (Signed)
ANTICOAGULATION CONSULT NOTE - Initial Consult  Pharmacy Consult for Warfarin Indication: continuation of home Rx  Allergies  Allergen Reactions  . Aspirin Other (See Comments)    Cannot take this medication due to kidney disease  . Ibuprofen Other (See Comments)    Kidney disease   Patient Measurements: Height: 5\' 1"  (154.9 cm) Weight: 132 lb 7.9 oz (60.1 kg) IBW/kg (Calculated) : 47.8   Vital Signs: Temp: 97.7 F (36.5 C) (03/10 0400) Temp src: Oral (03/10 0400) BP: 113/54 mmHg (03/10 0600) Pulse Rate: 83  (03/10 0600)  Labs:  Basename 03/28/11 0441 03/27/11 1352  HGB -- 12.7  HCT -- 36.9  PLT -- 472*  APTT -- --  LABPROT 18.2* 18.1*  INR 1.48 1.47  HEPARINUNFRC -- --  CREATININE -- 0.76  CKTOTAL -- --  CKMB -- --  TROPONINI -- --   Estimated Creatinine Clearance: 77 ml/min (by C-G formula based on Cr of 0.76).  Medical History: Past Medical History  Diagnosis Date  . Shingles     11/2010  . Cellulitis     of face - 12/2010  . Peripheral vascular disease in diabetes mellitus   . Hyperlipidemia   . Tobacco abuse     0.5 - 1ppd x 21 yrs  . Headache   . Renal disorder     glomerulonephritis  . Blood transfusion   . Anxiety   . Depression   . Peripheral vascular disease   . Diabetes mellitus     IDDM Since Age 50   Medications:  Prescriptions prior to admission  Medication Sig Dispense Refill  . ALPRAZolam (XANAX) 1 MG tablet Take 1 mg by mouth daily as needed. For anxiety      . diphenhydrAMINE (BENADRYL) 25 MG tablet Take 25 mg by mouth at bedtime as needed. For sleep      . fish oil-omega-3 fatty acids 1000 MG capsule Take 1 g by mouth daily.      . furosemide (LASIX) 20 MG tablet Take 20 mg by mouth 3 (three) times a week. On Mondays, Wednesdays, and Fridays       . insulin aspart (NOVOLOG) 100 UNIT/ML injection Inject 5 Units into the skin 3 (three) times daily before meals.  1 vial  12  . insulin glargine (LANTUS) 100 UNIT/ML injection Inject  15 Units into the skin at bedtime.  10 mL  12  . losartan (COZAAR) 25 MG tablet Take 12.5 mg by mouth daily.      . Multiple Vitamins-Minerals (MULTIVITAMINS THER. W/MINERALS) TABS Take 1 tablet by mouth daily.        . potassium chloride SA (K-DUR,KLOR-CON) 20 MEQ tablet Take 0.5 tablets (10 mEq total) by mouth daily.  7 tablet  0  . sertraline (ZOLOFT) 50 MG tablet Take 50 mg by mouth daily.       Marland Kitchen warfarin (COUMADIN) 1 MG tablet Take 7 mg by mouth daily. Takes at 6pm      . acetaminophen (TYLENOL) 500 MG tablet Take 1,000 mg by mouth every 6 (six) hours as needed. For pain        Assessment: INR below goal  Goal of Therapy:  INR 2-3   Plan: Coumadin 7.5mg  today x 1 INR daily until stable  [name] A 03/28/2011,9:53 AM

## 2011-03-28 NOTE — Consult Note (Signed)
  [id number] 

## 2011-03-28 NOTE — Consult Note (Signed)
[name], [name]                ACCOUNT NO.:  [id number]  MEDICAL RECORD NO.:  [id number]  LOCATION:  IC11                          FACILITY:  [facility]  PHYSICIAN:  [name], MD DATE OF BIRTH:  07/20/1970  DATE OF CONSULTATION:  03/28/2011 DATE OF DISCHARGE:                                CONSULTATION   REASON FOR CONSULT:  Uncontrolled type 1 diabetes.  HISTORY OF PRESENT ILLNESS:  This is a 41 year old female patient with medical history significant for type 1 diabetes for the last 32 years. She is familiar to me from a prior concern in January 2013.  She is currently in house with a complaint of facial swelling as well as mucocutaneous swelling suspected to be a result of ACE or ARB hypersensitivity.  The patient has type 1 diabetes for the last 32 years, on treatment with Lantus 15 units b.i.d. and regular insulin 5 units t.i.d. before meals.  The patient's A1c was very high at 11.5%. When I saw her last, she was supposed to follow up with me for diabetes, however, would not make it to the office.  She admits she does not monitor her blood glucose regularly, and she admits dietary indiscretion.  However, she is willing to re-engage in intensive diabetes management.  Her presentation was significant for hyperglycemia for which she was started on insulin infusion for control. For the mucocutaneous edema, she was given steroids which triggered the current prevailing hyperglycemia.  PAST MEDICAL HISTORY:  Type 1 diabetes, peripheral vascular disease, which resulted in amputation of left fourth and fifth toe, hyperlipidemia, tobacco abuse, anxiety and depression disorders.  HOME MEDICATIONS:  Xanax, Benadryl, regular insulin 5 units t.i.d. before meals, losartan 12.5 mg daily, Coumadin 1 mg daily, Tylenol, fish oil, Lasix, Lantus 15 units t.i.d., multivitamin 1 tab daily, K-Dur 20 mEq daily, Zoloft 50 mg daily.  SOCIAL HISTORY:  Significant for heavy smoking.  No  alcohol or drug use.  FAMILY HISTORY:  Significant for COPD in her mother who also had hypertension and diabetes.  Her father has diabetes and hypertension.  REVIEW OF SYSTEMS:  As in HPI.  All others are reviewed and negative.  PHYSICAL EXAMINATION:  GENERAL:  She is alert and oriented x3 in her ICU bed, not in acute distress. CURRENT VITALS:  Blood pressure 113/54, pulse rate 98, temperature 97.7. HEENT:  Well hydrated. NECK:  [name], no thyromegaly. CHEST:  Clear to auscultation bilaterally. CARDIOVASCULAR:  Normal S1, S2.  No murmur.  No gallop. ABDOMEN:  Soft and nontender. EXTREMITIES:  No edema. CNS:  Nonfocal. SKIN:  No rash.  No hyperemia.  LABORATORY DATA:  Sodium 135, potassium 4.0, chloride 102, bicarb 27, BUN 11, creatinine 0.76.  Her A1c in January was 8%, which was considered improvement from 11% in March 2012.  ASSESSMENT: 1. Type 1 diabetes chronically uncontrolled. 2. Noncompliance to outpatient followup. 3. Peripheral vascular disease with resulting amputation of left     fourth and fifth toes. 4. Hyperlipidemia. 5. Tobacco abuse. 6. Mood disorder. 7. Possible allergy to ACE inhibitor or ARB.  PLAN:  The patient will be continued on insulin infusion for the prevailing hyperglycemia which is triggered by  the IV steroids initiated for mucocutaneous swelling.  After 24 hours of infusion, was converted to basal bolus insulin involving Lantus and NovoLog.  I have re- emphasized to the patient the need for outpatient followup for diabetes, and I have counseled the patient on acute on chronic complications of diabetes and ways to prevent better control of diabetes on time.  I will continue to see her in house as well as an outpatient and will give you her progress.          ______________________________ [name], MD     GN/MEDQ  D:  03/28/2011  T:  03/28/2011  Job:  [id number]

## 2011-03-29 LAB — GLUCOSE, CAPILLARY
Glucose-Capillary: 130 mg/dL — ABNORMAL HIGH (ref 70–99)
Glucose-Capillary: 134 mg/dL — ABNORMAL HIGH (ref 70–99)
Glucose-Capillary: 141 mg/dL — ABNORMAL HIGH (ref 70–99)
Glucose-Capillary: 148 mg/dL — ABNORMAL HIGH (ref 70–99)
Glucose-Capillary: 155 mg/dL — ABNORMAL HIGH (ref 70–99)
Glucose-Capillary: 161 mg/dL — ABNORMAL HIGH (ref 70–99)
Glucose-Capillary: 190 mg/dL — ABNORMAL HIGH (ref 70–99)
Glucose-Capillary: 192 mg/dL — ABNORMAL HIGH (ref 70–99)
Glucose-Capillary: 199 mg/dL — ABNORMAL HIGH (ref 70–99)
Glucose-Capillary: 204 mg/dL — ABNORMAL HIGH (ref 70–99)

## 2011-03-29 LAB — PROTIME-INR
INR: 2.41 — ABNORMAL HIGH (ref 0.00–1.49)
Prothrombin Time: 26.6 seconds — ABNORMAL HIGH (ref 11.6–15.2)

## 2011-03-29 MED ORDER — SODIUM CHLORIDE 0.9 % IJ SOLN
INTRAMUSCULAR | Status: AC
  Administered 2011-03-29: 05:00:00
  Filled 2011-03-29: qty 3

## 2011-03-29 MED ORDER — INSULIN ASPART 100 UNIT/ML ~~LOC~~ SOLN
8.0000 [IU] | Freq: Three times a day (TID) | SUBCUTANEOUS | Status: DC
  Administered 2011-03-29 – 2011-03-30 (×3): 8 [IU] via SUBCUTANEOUS

## 2011-03-29 MED ORDER — INSULIN NPH (HUMAN) (ISOPHANE) 100 UNIT/ML ~~LOC~~ SUSP
10.0000 [IU] | Freq: Once | SUBCUTANEOUS | Status: AC
  Administered 2011-03-29: 10 [IU] via SUBCUTANEOUS
  Filled 2011-03-29: qty 10

## 2011-03-29 MED ORDER — WARFARIN SODIUM 2 MG PO TABS
2.0000 mg | ORAL_TABLET | Freq: Once | ORAL | Status: AC
  Administered 2011-03-29: 2 mg via ORAL
  Filled 2011-03-29: qty 1

## 2011-03-29 MED ORDER — INSULIN GLARGINE 100 UNIT/ML ~~LOC~~ SOLN
28.0000 [IU] | Freq: Every day | SUBCUTANEOUS | Status: DC
  Administered 2011-03-29: 28 [IU] via SUBCUTANEOUS

## 2011-03-29 MED ORDER — INSULIN ASPART 100 UNIT/ML ~~LOC~~ SOLN
0.0000 [IU] | Freq: Three times a day (TID) | SUBCUTANEOUS | Status: DC
  Administered 2011-03-29: 8 [IU] via SUBCUTANEOUS
  Administered 2011-03-30: 3 [IU] via SUBCUTANEOUS

## 2011-03-29 MED ORDER — INSULIN ASPART 100 UNIT/ML ~~LOC~~ SOLN
8.0000 [IU] | Freq: Three times a day (TID) | SUBCUTANEOUS | Status: DC
  Filled 2011-03-29: qty 3

## 2011-03-29 NOTE — Progress Notes (Signed)
[name], [name]                ACCOUNT NO.:  [id number]  MEDICAL RECORD NO.:  [id number]  LOCATION:  IC11                          FACILITY:  [facility]  PHYSICIAN:  [name], MD DATE OF BIRTH:  [birth year]  DATE OF PROCEDURE:  03/29/2011 DATE OF DISCHARGE:                                PROGRESS NOTE   REASON FOR FOLLOWUP:  Uncontrolled type 1 diabetes.  SUBJECTIVE:  The patient remains on insulin infusion with good carotid glycemic control near normal.  She is cooperating with oral feeding.  No hypoglycemia.  OBJECTIVE:  GENERAL:  The patient is awake, alert, and oriented x3. VITAL SIGNS:  Blood pressure of 141/74, pulse rate 78, temperature 98.1. HEENT:  Well hydrated. CHEST:  Clear to auscultation bilaterally. CARDIOVASCULAR:  Normal S1, S2.  No murmur.  No gallop. ABDOMEN:  Soft and nontender. EXTREMITIES:  No edema. CNS:  Nonfocal. SKIN:  No rash.  No hyperemia.  LABORATORY DATA:  Her glycemic control ranges between 123 and 192.  ASSESSMENT:  Type 1 diabetes, chronically uncontrolled.  Most recent A1c 8%.  The patient in-house with mucocutaneous edema, suspected allergy for ARBs and ACE.  PLAN:  I will attempt to comfort to subcutaneous insulin therapy using Lantus and NovoLog.  I will initiate Lantus 28 units at bedtime and NovoLog 8 units t.i.d. before meals with moderate dose sliding scale.  I will use NPH 10 units 1 time dose total.  If patient's steroids are tapered off, she will expect to need less insulin to manage her diabetes.  I will continue to follow her in-house as well as an outpatient.  She is counseled regarding the need to control diabetes to avoid acute and chronic complications.          ______________________________ [name], MD    GN/MEDQ  D:  03/29/2011  T:  03/29/2011  Job:  [id number]

## 2011-03-29 NOTE — Progress Notes (Signed)
Patient ID: [name], female   DOB: 12-05-1970, 41 y.o.   MRN: [id number]

## 2011-03-29 NOTE — Progress Notes (Signed)
NAMESUMAYYAH, MOXLEY                ACCOUNT NO.:  [id number]  MEDICAL RECORD NO.:  [id number]  LOCATION:  IC11                          FACILITY:  [facility]  PHYSICIAN:  Jacqueline Delapena L. Luan Pulling, M.D.DATE OF BIRTH:  Nov 22, 1970  DATE OF PROCEDURE: DATE OF DISCHARGE:                                PROGRESS NOTE   Ms. Legel was admitted with angioedema, possibly related to losartan. She has had a recent amputation of her left fourth and fifth toe secondary to gangrene and peripheral vascular disease.  She has severe diabetes.  She has a history of anxiety and depression, tobacco abuse, hyperlipidemia.  PHYSICAL EXAMINATION:  GENERAL:  Her exam this morning shows that she is awake and alert. VITAL SIGNS:  Her temperature is 98.3, pulse 85, respirations 13, blood pressure 126/60, O2 sat 86%, but now up in the 90s, now that she is back on her oxygen. CHEST:  Clear. HEART:  Regular. EXTREMITIES:  She has some gangrenous changes in her left great toe.  Her blood sugar however has been somewhat high.  Her INR today is 2.41.  The amputated toes seem to be doing okay.  She has significant amount of peripheral edema diffusely.  She is complaining of some symptoms of UTI.     Deniel Mcquiston L. Luan Pulling, M.D.     ELH/MEDQ  D:  03/29/2011  T:  03/29/2011  Job:  [id number]

## 2011-03-29 NOTE — Consult Note (Addendum)
[facility] for Warfarin Indication: continuation of home Rx  Allergies  Allergen Reactions  . Aspirin Other (See Comments)    Cannot take this medication due to kidney disease  . Ibuprofen Other (See Comments)    Kidney disease   Patient Measurements: Height: 5\' 1"  (154.9 cm) Weight: 132 lb 7.9 oz (60.1 kg) IBW/kg (Calculated) : 47.8   Vital Signs: Temp: 97.8 F (36.6 C) (03/11 0800) Temp src: Oral (03/11 0800) BP: 118/66 mmHg (03/11 0900) Pulse Rate: 80  (03/11 0800)  Labs:  Basename 03/29/11 0636 03/28/11 0441 03/27/11 1352  HGB -- -- 12.7  HCT -- -- 36.9  PLT -- -- 472*  APTT -- -- --  LABPROT 26.6* 18.2* 18.1*  INR 2.41* 1.48 1.47  HEPARINUNFRC -- -- --  CREATININE -- -- 0.76  CKTOTAL -- -- --  CKMB -- -- --  TROPONINI -- -- --   Estimated Creatinine Clearance: 77 ml/min (by C-G formula based on Cr of 0.76).  Medical History: Past Medical History  Diagnosis Date  . Shingles     11/2010  . Cellulitis     of face - 12/2010  . Peripheral vascular disease in diabetes mellitus   . Hyperlipidemia   . Tobacco abuse     0.5 - 1ppd x 21 yrs  . Headache   . Renal disorder     glomerulonephritis  . Blood transfusion   . Anxiety   . Depression   . Peripheral vascular disease   . Diabetes mellitus     IDDM Since Age 52   Medications:  Prescriptions prior to admission  Medication Sig Dispense Refill  . ALPRAZolam (XANAX) 1 MG tablet Take 1 mg by mouth daily as needed. For anxiety      . diphenhydrAMINE (BENADRYL) 25 MG tablet Take 25 mg by mouth at bedtime as needed. For sleep      . fish oil-omega-3 fatty acids 1000 MG capsule Take 1 g by mouth daily.      . furosemide (LASIX) 20 MG tablet Take 20 mg by mouth 3 (three) times a week. On Mondays, Wednesdays, and Fridays       . insulin aspart (NOVOLOG) 100 UNIT/ML injection Inject 5 Units into the skin 3 (three) times daily before meals.  1 vial  12  . insulin glargine  (LANTUS) 100 UNIT/ML injection Inject 15 Units into the skin at bedtime.  10 mL  12  . losartan (COZAAR) 25 MG tablet Take 12.5 mg by mouth daily.      . Multiple Vitamins-Minerals (MULTIVITAMINS THER. W/MINERALS) TABS Take 1 tablet by mouth daily.        . potassium chloride SA (K-DUR,KLOR-CON) 20 MEQ tablet Take 0.5 tablets (10 mEq total) by mouth daily.  7 tablet  0  . sertraline (ZOLOFT) 50 MG tablet Take 50 mg by mouth daily.       Marland Kitchen warfarin (COUMADIN) 1 MG tablet Take 7 mg by mouth daily. Takes at 6pm      . acetaminophen (TYLENOL) 500 MG tablet Take 1,000 mg by mouth every 6 (six) hours as needed. For pain        Assessment: INR now at goal.    Goal of Therapy:  INR 2-3   Plan: Coumadin 2 mg today x 1 INR daily until stable  Pricilla Larsson 03/29/2011,9:36 AM  Home warfarin dose = 3mg  daily per patient's pharmacy.  Pricilla Larsson, [facility] 03/29/2011 1:50 PM

## 2011-03-30 LAB — URINE CULTURE
Colony Count: NO GROWTH
Culture  Setup Time: 201303112154
Culture: NO GROWTH

## 2011-03-30 LAB — PROTIME-INR
INR: 1.91 — ABNORMAL HIGH (ref 0.00–1.49)
Prothrombin Time: 22.2 seconds — ABNORMAL HIGH (ref 11.6–15.2)

## 2011-03-30 LAB — GLUCOSE, CAPILLARY
Glucose-Capillary: 187 mg/dL — ABNORMAL HIGH (ref 70–99)
Glucose-Capillary: 274 mg/dL — ABNORMAL HIGH (ref 70–99)
Glucose-Capillary: 220 mg/dL — ABNORMAL HIGH (ref 70–99)
Glucose-Capillary: 229 mg/dL — ABNORMAL HIGH (ref 70–99)
Glucose-Capillary: 185 mg/dL — ABNORMAL HIGH (ref 70–99)

## 2011-03-30 MED ORDER — PRAVASTATIN SODIUM 40 MG PO TABS: 40.0000 mg | ORAL_TABLET | Freq: Every day | ORAL | Status: DC

## 2011-03-30 MED ORDER — FUROSEMIDE 20 MG PO TABS: 20.0000 mg | ORAL_TABLET | ORAL | Status: DC

## 2011-03-30 MED ORDER — OXYCODONE-ACETAMINOPHEN 10-325 MG PO TABS: 1.0000 | ORAL_TABLET | Freq: Four times a day (QID) | ORAL | Status: DC | PRN

## 2011-03-30 MED ORDER — AMLODIPINE BESYLATE 2.5 MG PO TABS: 2.5000 mg | ORAL_TABLET | Freq: Every day | ORAL | Status: DC

## 2011-03-30 NOTE — Progress Notes (Signed)
Patient ID: [name], female   DOB: 1970-09-18, 40 y.o.   MRN: [id number]

## 2011-03-30 NOTE — Progress Notes (Signed)
   CARE MANAGEMENT NOTE 03/30/2011  Patient:  [name], [name]   Account Number:  [id number]  Date Initiated:  03/30/2011  Documentation initiated by:  Theophilus Kinds  Subjective/Objective Assessment:   Pt admitted with angioedema due to allergic reaction from medication. Pt lives with family. Has HH with [facility].     Action/Plan:   CM spoke with Romualdo Bolk, RN from [facility] to resume [facility] at discharge. Linda in to speak with pt.   Anticipated DC Date:  03/30/2011   Anticipated DC Plan:  [place]  CM consult      [facility] Choice  HOME HEALTH   Choice offered to / List presented to:  NA        HH arranged  HH-1 RN      [place].   Status of service:  Completed, signed off Medicare Important Message given?   (If response is "NO", the following Medicare IM given date fields will be blank) Date Medicare IM given:   Date Additional Medicare IM given:    Discharge Disposition:  [place]  Per UR Regulation:    If discussed at Long Length of Stay Meetings, dates discussed:    Comments:  03/30/11 Christinia Gully, RN, [place] [phone] Pt admitted with angioedema due to allergic reaction from medication. Pt already established with [facility]. Pt to resume [facility] at discharge. Romualdo Bolk aware and will speak with pt before discharge. No other needs noted at this this time.

## 2011-03-30 NOTE — Progress Notes (Signed)
States understanding of discharge prescriptions givenn.

## 2011-03-31 ENCOUNTER — Emergency Department (HOSPITAL_COMMUNITY)
Admission: EM | Admit: 2011-03-31 | Discharge: 2011-03-31 | Disposition: A | Discharge status: Home / Self Care | Payer: Medicaid Other | Attending: Emergency Medicine | Admitting: Emergency Medicine

## 2011-03-31 ENCOUNTER — Encounter (HOSPITAL_COMMUNITY): Payer: Self-pay

## 2011-03-31 ENCOUNTER — Emergency Department (HOSPITAL_COMMUNITY): Payer: Medicaid Other

## 2011-03-31 ENCOUNTER — Other Ambulatory Visit: Payer: Self-pay

## 2011-03-31 DIAGNOSIS — M79605 Pain in left leg: Secondary | ICD-10-CM

## 2011-03-31 DIAGNOSIS — M79601 Pain in right arm: Secondary | ICD-10-CM

## 2011-03-31 DIAGNOSIS — E119 Type 2 diabetes mellitus without complications: Secondary | ICD-10-CM | POA: Insufficient documentation

## 2011-03-31 DIAGNOSIS — R609 Edema, unspecified: Secondary | ICD-10-CM | POA: Insufficient documentation

## 2011-03-31 DIAGNOSIS — E785 Hyperlipidemia, unspecified: Secondary | ICD-10-CM | POA: Insufficient documentation

## 2011-03-31 DIAGNOSIS — R112 Nausea with vomiting, unspecified: Secondary | ICD-10-CM

## 2011-03-31 DIAGNOSIS — Z794 Long term (current) use of insulin: Secondary | ICD-10-CM | POA: Insufficient documentation

## 2011-03-31 DIAGNOSIS — F341 Dysthymic disorder: Secondary | ICD-10-CM | POA: Insufficient documentation

## 2011-03-31 DIAGNOSIS — I739 Peripheral vascular disease, unspecified: Secondary | ICD-10-CM | POA: Insufficient documentation

## 2011-03-31 DIAGNOSIS — M79609 Pain in unspecified limb: Secondary | ICD-10-CM | POA: Insufficient documentation

## 2011-03-31 DIAGNOSIS — Z79899 Other long term (current) drug therapy: Secondary | ICD-10-CM | POA: Insufficient documentation

## 2011-03-31 DIAGNOSIS — D72829 Elevated white blood cell count, unspecified: Secondary | ICD-10-CM

## 2011-03-31 DIAGNOSIS — Z86718 Personal history of other venous thrombosis and embolism: Secondary | ICD-10-CM | POA: Insufficient documentation

## 2011-03-31 DIAGNOSIS — R29898 Other symptoms and signs involving the musculoskeletal system: Secondary | ICD-10-CM | POA: Insufficient documentation

## 2011-03-31 DIAGNOSIS — E876 Hypokalemia: Secondary | ICD-10-CM

## 2011-03-31 DIAGNOSIS — D684 Acquired coagulation factor deficiency: Secondary | ICD-10-CM | POA: Insufficient documentation

## 2011-03-31 DIAGNOSIS — R209 Unspecified disturbances of skin sensation: Secondary | ICD-10-CM | POA: Insufficient documentation

## 2011-03-31 LAB — BASIC METABOLIC PANEL
BUN: 32 mg/dL — ABNORMAL HIGH (ref 6–23)
CO2: 23 mEq/L (ref 19–32)
Calcium: 7.7 mg/dL — ABNORMAL LOW (ref 8.4–10.5)
Chloride: 105 mEq/L (ref 96–112)
Creatinine, Ser: 0.97 mg/dL (ref 0.50–1.10)
GFR calc Af Amer: 83 mL/min — ABNORMAL LOW (ref >90)
GFR calc non Af Amer: 72 mL/min — ABNORMAL LOW (ref >90)
Glucose, Bld: 93 mg/dL (ref 70–99)
Potassium: 3.3 mEq/L — ABNORMAL LOW (ref 3.5–5.1)
Sodium: 136 mEq/L (ref 135–145)

## 2011-03-31 LAB — DIFFERENTIAL
Basophils Absolute: 0 10*3/uL (ref 0.0–0.1)
Basophils Relative: 0 % (ref 0–1)
Eosinophils Absolute: 0.1 10*3/uL (ref 0.0–0.7)
Eosinophils Relative: 0 % (ref 0–5)
Lymphocytes Relative: 26 % (ref 12–46)
Lymphs Abs: 5.4 10*3/uL — ABNORMAL HIGH (ref 0.7–4.0)
Monocytes Absolute: 1.9 10*3/uL — ABNORMAL HIGH (ref 0.1–1.0)
Monocytes Relative: 9 % (ref 3–12)
Neutro Abs: 13.2 10*3/uL — ABNORMAL HIGH (ref 1.7–7.7)
Neutrophils Relative %: 64 % (ref 43–77)

## 2011-03-31 LAB — CBC
HCT: 35.6 % — ABNORMAL LOW (ref 36.0–46.0)
Hemoglobin: 12.1 g/dL (ref 12.0–15.0)
MCH: 30.3 pg (ref 26.0–34.0)
MCHC: 34 g/dL (ref 30.0–36.0)
MCV: 89 fL (ref 78.0–100.0)
Platelets: 251 10*3/uL (ref 150–400)
RBC: 4 MIL/uL (ref 3.87–5.11)
RDW: 14.6 % (ref 11.5–15.5)
WBC: 20.6 10*3/uL — ABNORMAL HIGH (ref 4.0–10.5)

## 2011-03-31 LAB — PROTIME-INR
INR: 1.12 (ref 0.00–1.49)
Prothrombin Time: 14.6 seconds (ref 11.6–15.2)

## 2011-03-31 LAB — TROPONIN I: Troponin I: <0.3 ng/mL (ref <0.30)

## 2011-03-31 MED ORDER — SODIUM CHLORIDE 0.9 % IV SOLN
Freq: Once | INTRAVENOUS | Status: AC
  Administered 2011-03-31: 1000 mL via INTRAVENOUS

## 2011-03-31 MED ORDER — ENOXAPARIN SODIUM 80 MG/0.8ML ~~LOC~~ SOLN
1.0000 mg/kg | Freq: Once | SUBCUTANEOUS | Status: AC
  Administered 2011-03-31: 11:00:00 via SUBCUTANEOUS
  Filled 2011-03-31: qty 0.8

## 2011-03-31 MED ORDER — HYDROMORPHONE HCL PF 2 MG/ML IJ SOLN
2.0000 mg | Freq: Once | INTRAMUSCULAR | Status: AC
  Administered 2011-03-31: 2 mg via INTRAMUSCULAR
  Filled 2011-03-31: qty 1

## 2011-03-31 MED ORDER — SODIUM CHLORIDE 0.9 % IV BOLUS (SEPSIS): 1000.0000 mL | Freq: Once | INTRAVENOUS | Status: DC

## 2011-03-31 MED ORDER — HYDROMORPHONE HCL PF 1 MG/ML IJ SOLN
1.0000 mg | Freq: Once | INTRAMUSCULAR | Status: DC
  Filled 2011-03-31: qty 1

## 2011-03-31 MED ORDER — POTASSIUM CHLORIDE CRYS ER 20 MEQ PO TBCR
40.0000 meq | EXTENDED_RELEASE_TABLET | Freq: Once | ORAL | Status: AC
  Administered 2011-03-31: 40 meq via ORAL
  Filled 2011-03-31: qty 2

## 2011-03-31 MED ORDER — ONDANSETRON 8 MG PO TBDP
8.0000 mg | ORAL_TABLET | Freq: Once | ORAL | Status: AC
  Administered 2011-03-31: 8 mg via ORAL

## 2011-03-31 MED ORDER — ONDANSETRON 4 MG PO TBDP
ORAL_TABLET | ORAL | Status: AC
  Filled 2011-03-31: qty 2

## 2011-03-31 MED ORDER — HYDROMORPHONE HCL PF 1 MG/ML IJ SOLN
1.0000 mg | Freq: Once | INTRAMUSCULAR | Status: AC
  Administered 2011-03-31: 1 mg via INTRAMUSCULAR

## 2011-03-31 MED ORDER — HYDROMORPHONE HCL PF 1 MG/ML IJ SOLN
INTRAMUSCULAR | Status: AC
  Filled 2011-03-31: qty 1

## 2011-03-31 MED ORDER — HYDROMORPHONE HCL PF 1 MG/ML IJ SOLN
1.0000 mg | Freq: Once | INTRAMUSCULAR | Status: AC
  Administered 2011-03-31: 1 mg via INTRAVENOUS

## 2011-03-31 MED ORDER — ONDANSETRON 8 MG PO TBDP: 8.0000 mg | ORAL_TABLET | Freq: Two times a day (BID) | ORAL | Status: DC | PRN

## 2011-03-31 NOTE — Discharge Summary (Signed)
Physician Discharge Summary  Patient ID: [name] MRN: [id number] DOB/AGE: 05-31-1970 41 y.o. 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 *    Medication List  As of 03/31/2011  7:21 AM   STOP taking these medications         losartan 25 MG tablet         TAKE these medications         acetaminophen 500 MG tablet   Commonly known as: TYLENOL   Take 1,000 mg by mouth every 6 (six) hours as needed. For pain      ALPRAZolam 1 MG tablet   Commonly known as: XANAX   Take 1 mg by mouth daily as needed. For anxiety      amLODipine 2.5 MG tablet   Commonly known as: NORVASC   Take 1 tablet (2.5 mg total) by mouth daily.      diphenhydrAMINE 25 MG tablet   Commonly known as: BENADRYL   Take 25 mg by mouth at bedtime as needed. For sleep      fish oil-omega-3 fatty acids 1000 MG capsule   Take 1 g by mouth daily.      furosemide 20 MG tablet   Commonly known as: LASIX   Take 20 mg by mouth 3 (three) times a week. On Mondays, Wednesdays, and Fridays      furosemide 20 MG tablet   Commonly known as: LASIX   Take 1 tablet (20 mg total) by mouth 3 (three) times a week.      insulin aspart 100 UNIT/ML injection   Commonly known as: novoLOG   Inject 5 Units into the skin 3 (three) times daily before meals.      insulin glargine 100 UNIT/ML injection   Commonly known as: LANTUS   Inject 15 Units into the skin at bedtime.      multivitamins ther. w/minerals Tabs   Take 1 tablet by mouth daily.      oxyCODONE-acetaminophen 10-325 MG per tablet   Commonly known as: PERCOCET   Take 1 tablet by mouth every 6 (six) hours as needed for pain.      potassium chloride SA 20 MEQ tablet   Commonly known as: K-DUR,KLOR-CON   Take 0.5 tablets (10 mEq total) by mouth daily.      pravastatin 40 MG tablet   Commonly known as: PRAVACHOL   Take 1 tablet (40 mg total) by mouth daily.      sertraline 50 MG tablet   Commonly known as: ZOLOFT   Take 50 mg by mouth daily.      warfarin 1 MG tablet   Commonly known as: COUMADIN   Take 3 mg by mouth daily. Takes at 6pm            Discharged Condition: Improved    Consults: Endocrinology  Significant Diagnostic Studies: Ct Chest W Contrast  03/12/2011  *RADIOLOGY REPORT*  Clinical Data: Facial swelling; history of diabetes and smoking. Assess for SVC syndrome.  CT CHEST WITH CONTRAST  Technique:  Multidetector CT imaging of the chest was performed following the standard protocol during bolus administration of intravenous contrast.  Contrast: 128mL OMNIPAQUE  IOHEXOL 300 MG/ML IV SOLN  Comparison: Chest radiograph performed 01/12 1013, and CTA of the chest performed 11/29/2008  Findings: There is no evidence of SVC syndrome.  The superior vena cava appears fully patent.  There is significant narrowing of the left jugular vein just proximal to the left brachiocephalic vein, without evidence of occlusion.  Diffuse soft tissue edema is noted along the chest and abdominal wall, compatible with anasarca.  Minimal scarring or atelectasis is noted at the right lung base. The lungs are otherwise clear.  There is no evidence of focal consolidation, pleural effusion or pneumothorax.  No masses are seen.  The mediastinum is unremarkable in appearance.  Scattered small mediastinal nodes remain normal in size.  There is no evidence of mediastinal lymphadenopathy.  No pericardial effusion identified. The great vessels are unremarkable in appearance.  The thyroid gland is within normal limits.  No axillary lymphadenopathy is seen.  Small volume  abdominal ascites is noted.  Mild periportal edema is noted.  The liver is otherwise unremarkable in appearance.  The spleen is within normal limits.  The gallbladder is relatively contracted and grossly unremarkable.  The pancreas is difficult to fully assess due to surrounding edema, but appears grossly unremarkable.  A 1.4 cm cyst is noted at the upper pole of the right kidney.  The kidneys are otherwise unremarkable in appearance.  Diffuse mesenteric edema is noted, with diffuse prominence of the mesenteric vasculature.  Scattered calcification is noted along the abdominal aorta and its branches.  No acute osseous abnormalities are identified.  IMPRESSION:  1.  No evidence of SVC syndrome.  The superior vena cava appears patent; there is significant narrowing of the left jugular vein just proximal to the left brachiocephalic vein, without evidence of occlusion. 2.  Significant diffuse soft tissue edema along the chest abdominal wall, compatible with anasarca. 3.  Small volume abdominal ascites noted. 4.  Diffuse mesenteric edema noted, with diffuse prominence of the mesenteric vasculature. 5.  Mild periportal edema noted; diffuse soft tissue edema noted within the abdomen. 6.  Minimal scarring or atelectasis at the right lung base; lungs otherwise clear. 7.  Right renal cyst noted. 8.  Scattered calcification along the abdominal aorta and its branches.  Original Report Authenticated By: Santa Lighter, M.D.    Lab Results: Basic Metabolic Panel: No results found for this basename: NA:2,K:2,CL:2,CO2:2,GLUCOSE:2,BUN:2,CREATININE:2,CALCIUM:2,MG:2,PHOS:2 in the last 72 hours Liver Function Tests: No results found for this basename: AST:2,ALT:2,ALKPHOS:2,BILITOT:2,PROT:2,ALBUMIN:2 in the last 72 hours   CBC: No results found for this basename: WBC:2,NEUTROABS:2,HGB:2,HCT:2,MCV:2,PLT:2 in the last 72 hours  Recent Results (from the past 240 hour(s))  MRSA PCR SCREENING     Status: Normal   Collection  Time   03/28/11 12:00 AM      Component Value Range Status Comment   MRSA by PCR NEGATIVE  NEGATIVE  Final   URINE CULTURE     Status: Normal   Collection Time   03/29/11  7:30 PM      Component Value Range Status Comment   Specimen Description URINE, CLEAN CATCH   Final    Special Requests NONE   Final    Culture  Setup Time [id number]   Final    Colony Count NO GROWTH   Final    Culture NO GROWTH   Final    Report Status 03/30/2011 FINAL   Final      Hospital Course: She was admitted with swelling of the face. This is recurrent. He has been thought  in the past perhaps be related to dental abscess but this time it seemed to be more of an angioedema situation it is thought to be related to low starting. This occurred fairly suddenly. She took Benadryl but it did not help. She was started on intravenous steroids she was taken off of losartan she had difficulty controlling her blood sugar because of the steroids. Her blood sugar came under control her facial swelling was much better she was placed back on her regular doses of insulin.  Discharge Exam: Blood pressure 149/80, pulse 68, temperature 98 F (36.7 C), temperature source Oral, resp. rate 20, height 5\' 1"  (1.549 m), weight 64.048 kg (141 lb 3.2 oz), last menstrual period 12/28/2010, SpO2 98.00%. Her face was much better. She still has some gangrene of her left great toe in she's concerned that that toe may need to be amputated as well. I don't think it is clearly a demarcation yet. Her chest is clear heart regular she still has edema related to her nephrotic syndrome  Disposition: Home with home health services  Discharge Orders    Future Appointments: Provider: Department: Dept Phone: Center:   06/08/2011 2:30 PM [facility] [id number] [facility]   06/08/2011 3:00 PM Mal Misty, MD [facility] [id number] [facility]     Future Orders Please Complete By Expires   Discharge patient         Follow-up Information     Follow up with [place] Merida L, MD .         Signed: Alonza Bogus Pager [phone]  03/31/2011, 7:21 AM

## 2011-03-31 NOTE — ED Provider Notes (Signed)
History   This chart was scribed for [name]. Dorna Mai, MD by Carolyne Littles. The patient was seen in room APA11/APA11. Patient's care was started at 56.   CSN: [id number]  Arrival date & time 03/31/11  0710   First MD Initiated Contact with Patient 03/31/11 [phone]      Chief Complaint  Patient presents with  . Nausea  . Arm Pain  . Leg Pain    (Consider location/radiation/quality/duration/timing/severity/associated sxs/prior treatment) The history is limited by the condition of the patient.  A Level 5 Caveat Applies due to Patient Condition. Lisa FINISTER is a 41 y.o. female who presents to the Emergency Department complaining of constant moderate to severe pain to right upper extremity and left lower leg onset 6.5 hours ago and persistent since with associated nausea, weakness to RUE and LLE and numbness to distal aspect of right fingers. Patient notes using Percocet-10mg  approximately 6.5 hours ago with minimal relief of pain. Patient reports having blood clot within left lower leg previously and states current pain is similar. 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 Patient with h/o shingles, cellulitis, peripheral vascular disease, diabetes, HLD, renal disorder, embolism.   PCP- Luan Pulling  Past Medical History  Diagnosis Date  . Shingles     11/2010  . Cellulitis     of face - 12/2010  . Peripheral vascular disease in diabetes mellitus   . Hyperlipidemia   . Tobacco abuse     0.5 - 1ppd x 21 yrs  . Headache   . Renal disorder     glomerulonephritis  . Blood transfusion   . Anxiety   . Depression   . Peripheral vascular disease   . Diabetes mellitus     IDDM Since Age 33  . Embolism - blood clot     Past Surgical History  Procedure Date  . Wrist surgery     left  . Embolectomy 01/29/2011    Procedure: EMBOLECTOMY;  Surgeon: Mal Misty, MD;  Location: [facility];  Service: Vascular;  Laterality: Left;  Left Popliteal and Tibial  Embolectomy with patch angioplasty  . Dilation and curettage of uterus   . Multiple tooth extractions   . Amputation 03/03/2011    Procedure: AMPUTATION DIGIT;  Surgeon: Newt Minion, MD;  Location: [facility];  Service: Orthopedics;  Laterality: Left;  Left Foot Amputation 4th and 5th toes at MTP joint    Family History  Problem Relation Age of Onset  . COPD Mother     alive - 2  . Hypertension Mother   . Diabetes Mother   . Stroke Mother   . Coronary artery disease Mother   . Diabetes Father     alive - 47  . Hypertension Father   . Coronary artery disease Father   . Anesthesia problems Neg Hx     History  Substance Use Topics  . Smoking status: Passive Smoker -- 1.0 packs/day for 21 years    Types: Cigarettes    Last Attempt to Quit: 01/30/2011  . Smokeless tobacco: Never Used  . Alcohol Use: No    OB History    Grav Para Term Preterm Abortions TAB SAB Ect Mult Living   1 1  1      1       Review of Systems 10 Systems reviewed and are negative for acute change except as noted in the HPI.  Allergies  Aspirin and Ibuprofen  Home Medications  Current Outpatient Rx  Name Route Sig Dispense Refill  . ACETAMINOPHEN 500 MG PO TABS Oral Take 1,000 mg by mouth every 6 (six) hours as needed. For pain     . ALPRAZOLAM 1 MG PO TABS Oral Take 1 mg by mouth daily as needed. For anxiety    . AMLODIPINE BESYLATE 2.5 MG PO TABS Oral Take 1 tablet (2.5 mg total) by mouth daily. 30 tablet 5  . DIPHENHYDRAMINE HCL 25 MG PO TABS Oral Take 25 mg by mouth at bedtime as needed. For sleep    . OMEGA-3 FATTY ACIDS 1000 MG PO CAPS Oral Take 1 g by mouth daily.    . FUROSEMIDE 20 MG PO TABS Oral Take 20 mg by mouth 3 (three) times a week. On Mondays, Wednesdays, and Fridays     . INSULIN ASPART 100 UNIT/ML [place] SOLN Subcutaneous Inject 5 Units into the skin 3 (three) times daily before meals. 1 vial 12  . INSULIN GLARGINE 100 UNIT/ML [place] SOLN Subcutaneous Inject 15 Units into the skin at  bedtime. 10 mL 12  . THERA M PLUS PO TABS Oral Take 1 tablet by mouth daily.      . OXYCODONE-ACETAMINOPHEN 10-325 MG PO TABS Oral Take 1 tablet by mouth every 6 (six) hours as needed for pain. 60 tablet 0  . POTASSIUM CHLORIDE CRYS ER 20 MEQ PO TBCR Oral Take 0.5 tablets (10 mEq total) by mouth daily. 7 tablet 0  . PRAVASTATIN SODIUM 40 MG PO TABS Oral Take 1 tablet (40 mg total) by mouth daily. 30 tablet 5  . SERTRALINE HCL 50 MG PO TABS Oral Take 50 mg by mouth daily.     . WARFARIN SODIUM 1 MG PO TABS Oral Take 3 mg by mouth daily. Takes at 6pm    . ONDANSETRON 8 MG PO TBDP Oral Take 1 tablet (8 mg total) by mouth every 12 (twelve) hours as needed for nausea. 20 tablet 0    BP 176/97  Pulse 106  Temp(Src) 97.9 F (36.6 C) (Oral)  Resp 16  Ht 5\' 1"  (1.549 m)  Wt 141 lb (63.957 kg)  BMI 26.64 kg/m2  SpO2 100%  LMP 12/28/2010  Physical Exam  Nursing note and vitals reviewed. Constitutional: She is oriented to person, place, and time. She appears well-developed and well-nourished. No distress.  HENT:  Head: Normocephalic and atraumatic.  Eyes: EOM are normal. Pupils are equal, round, and reactive to light.  Neck: Normal range of motion. Neck supple. No tracheal deviation present.  Cardiovascular: Normal rate and regular rhythm.  Exam reveals no gallop and no friction rub.   No murmur heard.      1+ right radial pulse. Cap refill less than 2 seconds right and left fingers. 2+ left radial pulse.   Pulmonary/Chest: Effort normal and breath sounds normal. No respiratory distress. She has no wheezes. She has no rales.  Abdominal: Soft. She exhibits no distension. There is no tenderness.  Musculoskeletal: Normal range of motion. She exhibits edema.        Right fingers cool to touch. 2+ edema of bilateral lower extremities. No edema to RUE.   Neurological: She is alert and oriented to person, place, and time. No sensory deficit.  Skin: Skin is warm and dry.  Psychiatric: She has a  normal mood and affect. Her behavior is normal.    ED Course  Procedures (including critical care time)  DIAGNOSTIC STUDIES: Oxygen Saturation is 100% on room air, normal by my  interpretation.    COORDINATION OF CARE: 7:20AM-Patient informed of current plan for treatment and evaluation and agrees with plan at this time.  10:22AM- Patient explained lab and imaging results and current plan for treatment. Patient notes pain is still present at this time.  10:30AM- Consult complete with Dr. Luan Pulling. Patient case explained and discussed.   Labs Reviewed  CBC - Abnormal; Notable for the following:    WBC 20.6 (*)    HCT 35.6 (*)    All other components within normal limits  DIFFERENTIAL - Abnormal; Notable for the following:    Neutro Abs 13.2 (*)    Lymphs Abs 5.4 (*)    Monocytes Absolute 1.9 (*)    All other components within normal limits  BASIC METABOLIC PANEL - Abnormal; Notable for the following:    Potassium 3.3 (*)    BUN 32 (*)    Calcium 7.7 (*)    GFR calc non Af Amer 72 (*)    GFR calc Af Amer 83 (*)    All other components within normal limits  PROTIME-INR  TROPONIN I  CULTURE, BLOOD (ROUTINE X 2)  CULTURE, BLOOD (ROUTINE X 2)   US Venous Img Lower Unilateral Left  03/31/2011  *RADIOLOGY REPORT*  Clinical Data: Lower extremity pain, left leg pain and swelling  LEFT LOWER EXTREMITY VENOUS DUPLEX ULTRASOUND  Technique:  Gray-scale sonography with graded compression, as well as color Doppler and duplex ultrasound were performed to evaluate the deep venous system of the lower extremity from the level of the common femoral vein through the popliteal and proximal calf veins. Spectral Doppler was utilized to evaluate flow at rest and with distal augmentation maneuvers.  Comparison:  None.  Findings:  Normal compressibility of the common femoral, superficial femoral, and popliteal veins is demonstrated, as well as the visualized proximal calf veins.  No filling defects to  suggest DVT on grayscale or color Doppler imaging.  Doppler waveforms show normal direction of venous flow, normal respiratory phasicity and response to augmentation. There is subcutaneous edema within the lower extremity.  There is an ill-defined fluid collection medial left knee joint does not appear to represent an abscess.  IMPRESSION: No evidence of lower extremity deep vein thrombosis.  Fluid collection medial to the left knee joint  Original Report Authenticated By: Suzy Bouchard, M.D.   US Venous Img Upper Uni Right  03/31/2011  *RADIOLOGY REPORT*  Clinical data:  Right upper extremity pain and swelling.  Numbness in the fingers of the right hand.  RIGHT UPPER EXTREMITY VENOUS DOPPLER ULTRASOUND 03/31/2011:  Technique:  Gray-scale sonography with compression, as well as color and duplex Doppler ultrasound, were performed to evaluate the deep venous system from the level of  the elbow centrally to include the subclavian vein.  The right internal jugular vein was also evaluated.  Evaluation also included physiologic response to augmentation.  Comparison: None.  Findings: All of the visualized deep veins in the right upper extremity demonstrate normal Doppler signal, normal compressibility, normal phasicity, and normal physiologic response to augmentation.  No gray scale filling defects were identified. The right internal jugular vein is similarly patent.  Note is made of sluggish flow in the brachial and axillary veins.  IMPRESSION: No evidence of right upper extremity DVT.  Original Report Authenticated By: Deniece Portela, M.D.     1. Nausea & vomiting   2. Right arm pain   3. Left leg pain   4. Leukocytosis   5. Hypokalemia  ECG at time 0748 shows NSR at rate 94, low voltage QRS, septal infarct age indetermined with Q waves in leads V1-V2.  New q waves in leads I, aVL compared to ECG from 01/27/11.  However no ST or T wave abn's.      9:04 AM Pt's INR is subtherapeutic at 1.12.  Her  WBC is increased from 8 4 days ago to 20 today.  She also appears to be dehydrated with BUN of 32 with normal creatinine.  Will give IVF bolus and continue to treat pain  U/S's are pending.    10:34 AM Per radiologist interpretation, the patient has no DVT in right upper extremity or left lower extremity. Patient's left foot does appear chronically poor, but difficult to tell if there is any new acute changes as I had not seen the patient's foot in the past. I discussed the case with Dr. Luan Pulling who suggested giving a set of blood cultures given her elevated white count, we'll give another shot of Dilaudid to see if her pain is better improved and will give her a dose of Lovenox given her INR of 1.12. If her pain cannot be controlled, we have agreed to have her admitted after her analgesics are given. If the patient feels comfortable going home, he reports that he will follow up with the patient closely tomorrow.   12:49 PM Pt reports pain is improved, she doesn't want to be re-admitted.  She knows to return if worse in any way.  She understands that Dr. Luan Pulling will see her in office tomorrow and she is to call later today to verify appt time.  Pt will be discharged home with Rx for antiemetic.  Pt has percocet 10 mg at home.   She reports she has help at home also.    MDM  Pt has symmetric swelling of LLE as RLE, no new rash, no specific new discoloration that is obvious suggesting no cellulitis or new infection.  RUE has no appreciable deformity, discoloration, neuro-vascular status of RUE seems normal and symmetric to left.  I reviewed pt's prior records and given recent vascular issues, will get U/S of RUE and LLE to r/o new clot of vascular insuff.  Otherwise will check PT to ensure that coumadin level is in therapeutic range which would be reassuring that no new thrombus has formed to explain symptoms.  Pt is not febrile, not hypotensive, not toxic in appearance, but appears generally chronically  ill which I think coincides with recent multiple admissions.  She admits that facial swelling has improved since recent admission.  Due to new lateral q waves on ECG and RUE pain, troponin is added to labs.  This could be due also to simply lead placement.  If 1 troponin is neg, I think no cardiac infarct has occurred.        I personally performed the services described in this documentation, which was scribed in my presence. The recorded information has been reviewed and considered.    [name]. Renelle Stegenga, MD 03/31/11 1253

## 2011-03-31 NOTE — ED Notes (Signed)
Pt states that the pain has eased up a little. Family at bedside.

## 2011-03-31 NOTE — ED Notes (Signed)
Pt reports nausea, r arm pain and left leg pain since 1am.   Pt reports had blood clot in left leg and had toes amputated in February.  Reports vomited earlier this am.  Denies diarrhea.   LBM was this am.  Denies abd pain.

## 2011-03-31 NOTE — ED Notes (Signed)
2 nd blood culture attempted x 4 with no success. Dr. Dorna Mai notified.

## 2011-03-31 NOTE — Discharge Instructions (Signed)
Nausea and Vomiting Nausea is a sick feeling that often comes before throwing up (vomiting). Vomiting is a reflex where stomach contents come out of your mouth. Vomiting can cause severe loss of body fluids (dehydration). Children and elderly adults can become dehydrated quickly, especially if they also have diarrhea. Nausea and vomiting are symptoms of a condition or disease. It is important to find the cause of your symptoms. CAUSES   Direct irritation of the stomach lining. This irritation can result from increased acid production (gastroesophageal reflux disease), infection, food poisoning, taking certain medicines (such as nonsteroidal anti-inflammatory drugs), alcohol use, or tobacco use.   Signals from the brain.These signals could be caused by a headache, heat exposure, an inner ear disturbance, increased pressure in the brain from injury, infection, a tumor, or a concussion, pain, emotional stimulus, or metabolic problems.   An obstruction in the gastrointestinal tract (bowel obstruction).   Illnesses such as diabetes, hepatitis, gallbladder problems, appendicitis, kidney problems, cancer, sepsis, atypical symptoms of a heart attack, or eating disorders.   Medical treatments such as chemotherapy and radiation.   Receiving medicine that makes you sleep (general anesthetic) during surgery.  DIAGNOSIS Your caregiver may ask for tests to be done if the problems do not improve after a few days. Tests may also be done if symptoms are severe or if the reason for the nausea and vomiting is not clear. Tests may include:  Urine tests.   Blood tests.   Stool tests.   Cultures (to look for evidence of infection).   X-rays or other imaging studies.  Test results can help your caregiver make decisions about treatment or the need for additional tests. TREATMENT You need to stay well hydrated. Drink frequently but in small amounts.You may wish to drink water, sports drinks, clear broth, or  eat frozen ice pops or gelatin dessert to help stay hydrated.When you eat, eating slowly may help prevent nausea.There are also some antinausea medicines that may help prevent nausea. HOME CARE INSTRUCTIONS   Take all medicine as directed by your caregiver.   If you do not have an appetite, do not force yourself to eat. However, you must continue to drink fluids.   If you have an appetite, eat a normal diet unless your caregiver tells you differently.   Eat a variety of complex carbohydrates (rice, wheat, potatoes, bread), lean meats, yogurt, fruits, and vegetables.   Avoid high-fat foods because they are more difficult to digest.   Drink enough water and fluids to keep your urine clear or pale yellow.   If you are dehydrated, ask your caregiver for specific rehydration instructions. Signs of dehydration may include:   Severe thirst.   Dry lips and mouth.   Dizziness.   Dark urine.   Decreasing urine frequency and amount.   Confusion.   Rapid breathing or pulse.  SEEK IMMEDIATE MEDICAL CARE IF:   You have blood or brown flecks (like coffee grounds) in your vomit.   You have black or bloody stools.   You have a severe headache or stiff neck.   You are confused.   You have severe abdominal pain.   You have chest pain or trouble breathing.   You do not urinate at least once every 8 hours.   You develop cold or clammy skin.   You continue to vomit for longer than 24 to 48 hours.   You have a fever.  MAKE SURE YOU:   Understand these instructions.   Will watch your  condition.   Will get help right away if you are not doing well or get worse.  Document Released: 01/04/2005 Document Revised: 12/24/2010 Document Reviewed: 06/03/2010 [facility] Patient Information 2012 [place], [place].    Pain of Unknown Etiology (Pain Without a Known Cause) You have come to your caregiver because of pain. Pain can occur in any part of the body. Often there is not a definite  cause. If your laboratory (blood or urine) work was normal and x-rays or other studies were normal, your caregiver may treat you without knowing the cause of the pain. An example of this is the headache. Most headaches are diagnosed by taking a history. This means your caregiver asks you questions about your headaches. Your caregiver determines a treatment based on your answers. Usually testing done for headaches is normal. Often testing is not done unless there is no response to medications. Regardless of where your pain is located today, you can be given medications to make you comfortable. If no physical cause of pain can be found, most cases of pain will gradually leave as suddenly as they came.  If you have a painful condition and no reason can be found for the pain, It is importantthat you follow up with your caregiver. If the pain becomes worse or does not go away, it may be necessary to repeat tests and look further for a possible cause.  Only take over-the-counter or prescription medicines for pain, discomfort, or fever as directed by your caregiver.   For the protection of your privacy, test results can not be given over the phone. Make sure you receive the results of your test. Ask as to how these results are to be obtained if you have not been informed. It is your responsibility to obtain your test results.   You may continue all activities unless the activities cause more pain. When the pain lessens, it is important to gradually resume normal activities. Resume activities by beginning slowly and gradually increasing the intensity and duration of the activities or exercise. During periods of severe pain, bed-rest may be helpful. Lay or sit in any position that is comfortable.   Ice used for acute (sudden) conditions may be effective. Use a large plastic bag filled with ice and wrapped in a towel. This may provide pain relief.   See your caregiver for continued problems. They can help or refer  you for exercises or physical therapy if necessary.  If you were given medications for your condition, do not drive, operate machinery or power tools, or sign legal documents for 24 hours. Do not drink alcohol, take sleeping pills, or take other medications that may interfere with treatment. See your caregiver immediately if you have pain that is becoming worse and not relieved by medications. Document Released: 09/29/2000 Document Revised: 12/24/2010 Document Reviewed: 01/04/2005 [facility] Patient Information 2012 [place].

## 2011-03-31 NOTE — Progress Notes (Signed)
[name], [name]                ACCOUNT NO.:  [id number]  MEDICAL RECORD NO.:  [id number]  LOCATION:                                 FACILITY:  PHYSICIAN:  [name], MD DATE OF BIRTH:  03/02/1970  DATE OF PROCEDURE:  03/30/2011 DATE OF DISCHARGE:                                PROGRESS NOTE   REASON FOR FOLLOWUP:  Uncontrolled type 1 diabetes.  SUBJECTIVE:  The patient remains on basal bolus insulin with reasonable control of glycemia.  She is cooperative with oral feeding.  No hypoglycemia  OBJECTIVE:  GENERAL:  The patient is awake, alert, and oriented x3. VITAL SIGNS:  Blood pressure is controlled at 140/80, pulse rate is 68, temperature 98. HEENT:  Well hydrated. CHEST:  Clear to auscultation bilaterally. CARDIOVASCULAR:  Normal S1, S2.  No murmur.  No gallop. ABDOMEN:  Soft and nontender. EXTREMITIES:  No edema. CNS:  Nonfocal. SKIN:  No rash.  No hyperemia.  LABORATORY DATA:  Her blood glucose range from 100 to 190.  ASSESSMENT: 1. Type 1 diabetes, chronically uncontrolled, A1c of 8%. 2. Questionable compliance to medical recommendations.  PLAN:  We will continue to treat with basal and bolus insulin, Lantus 28 units at bedtime and NovoLog 8 units t.i.d. plus sliding scale.  The patient can be discharged on this regimen, and she is advised to see me in clinic in 1 week's time after discharge.  I will continue to see her in house if she stays.          ______________________________ [name], MD     GN/MEDQ  D:  03/30/2011  T:  03/30/2011  Job:  [id number]

## 2011-04-04 ENCOUNTER — Emergency Department (HOSPITAL_COMMUNITY)
Admission: EM | Admit: 2011-04-04 | Discharge: 2011-04-04 | Disposition: A | Discharge status: Home / Self Care | Payer: Medicaid Other | Attending: Emergency Medicine | Admitting: Emergency Medicine

## 2011-04-04 ENCOUNTER — Encounter (HOSPITAL_COMMUNITY): Payer: Self-pay

## 2011-04-04 DIAGNOSIS — S98139A Complete traumatic amputation of one unspecified lesser toe, initial encounter: Secondary | ICD-10-CM | POA: Insufficient documentation

## 2011-04-04 DIAGNOSIS — Z794 Long term (current) use of insulin: Secondary | ICD-10-CM | POA: Insufficient documentation

## 2011-04-04 DIAGNOSIS — F411 Generalized anxiety disorder: Secondary | ICD-10-CM | POA: Insufficient documentation

## 2011-04-04 DIAGNOSIS — Z86718 Personal history of other venous thrombosis and embolism: Secondary | ICD-10-CM | POA: Insufficient documentation

## 2011-04-04 DIAGNOSIS — R739 Hyperglycemia, unspecified: Secondary | ICD-10-CM

## 2011-04-04 DIAGNOSIS — E785 Hyperlipidemia, unspecified: Secondary | ICD-10-CM | POA: Insufficient documentation

## 2011-04-04 DIAGNOSIS — R000 Tachycardia, unspecified: Secondary | ICD-10-CM | POA: Insufficient documentation

## 2011-04-04 DIAGNOSIS — E119 Type 2 diabetes mellitus without complications: Secondary | ICD-10-CM | POA: Insufficient documentation

## 2011-04-04 DIAGNOSIS — F3289 Other specified depressive episodes: Secondary | ICD-10-CM | POA: Insufficient documentation

## 2011-04-04 DIAGNOSIS — F17200 Nicotine dependence, unspecified, uncomplicated: Secondary | ICD-10-CM | POA: Insufficient documentation

## 2011-04-04 DIAGNOSIS — F329 Major depressive disorder, single episode, unspecified: Secondary | ICD-10-CM | POA: Insufficient documentation

## 2011-04-04 DIAGNOSIS — Z7901 Long term (current) use of anticoagulants: Secondary | ICD-10-CM | POA: Insufficient documentation

## 2011-04-04 LAB — CBC
HCT: 32.6 % — ABNORMAL LOW (ref 36.0–46.0)
Hemoglobin: 11 g/dL — ABNORMAL LOW (ref 12.0–15.0)
MCH: 30.6 pg (ref 26.0–34.0)
MCHC: 33.7 g/dL (ref 30.0–36.0)
MCV: 90.8 fL (ref 78.0–100.0)
Platelets: 150 10*3/uL (ref 150–400)
RBC: 3.59 MIL/uL — ABNORMAL LOW (ref 3.87–5.11)
RDW: 15 % (ref 11.5–15.5)
WBC: 13.4 10*3/uL — ABNORMAL HIGH (ref 4.0–10.5)

## 2011-04-04 LAB — BASIC METABOLIC PANEL
BUN: 19 mg/dL (ref 6–23)
CO2: 23 mEq/L (ref 19–32)
Calcium: 7.8 mg/dL — ABNORMAL LOW (ref 8.4–10.5)
Chloride: 101 mEq/L (ref 96–112)
Creatinine, Ser: 0.97 mg/dL (ref 0.50–1.10)
GFR calc Af Amer: 83 mL/min — ABNORMAL LOW (ref >90)
GFR calc non Af Amer: 72 mL/min — ABNORMAL LOW (ref >90)
Glucose, Bld: 549 mg/dL — ABNORMAL HIGH (ref 70–99)
Potassium: 4.9 mEq/L (ref 3.5–5.1)
Sodium: 134 mEq/L — ABNORMAL LOW (ref 135–145)

## 2011-04-04 LAB — GLUCOSE, CAPILLARY
Glucose-Capillary: >600 mg/dL (ref 70–99)
Glucose-Capillary: 472 mg/dL — ABNORMAL HIGH (ref 70–99)
Glucose-Capillary: 361 mg/dL — ABNORMAL HIGH (ref 70–99)

## 2011-04-04 LAB — DIFFERENTIAL
Basophils Absolute: 0 10*3/uL (ref 0.0–0.1)
Basophils Relative: 0 % (ref 0–1)
Eosinophils Absolute: 0.1 10*3/uL (ref 0.0–0.7)
Eosinophils Relative: 1 % (ref 0–5)
Lymphocytes Relative: 14 % (ref 12–46)
Lymphs Abs: 1.8 10*3/uL (ref 0.7–4.0)
Monocytes Absolute: 0.9 10*3/uL (ref 0.1–1.0)
Monocytes Relative: 6 % (ref 3–12)
Neutro Abs: 10.6 10*3/uL — ABNORMAL HIGH (ref 1.7–7.7)
Neutrophils Relative %: 79 % — ABNORMAL HIGH (ref 43–77)

## 2011-04-04 LAB — PROTIME-INR
INR: 1.18 (ref 0.00–1.49)
Prothrombin Time: 15.2 seconds (ref 11.6–15.2)

## 2011-04-04 MED ORDER — ONDANSETRON HCL 4 MG/2ML IJ SOLN
4.0000 mg | Freq: Once | INTRAMUSCULAR | Status: AC
  Administered 2011-04-04: 4 mg via INTRAVENOUS
  Filled 2011-04-04: qty 2

## 2011-04-04 MED ORDER — HYDROMORPHONE HCL PF 1 MG/ML IJ SOLN
1.0000 mg | Freq: Once | INTRAMUSCULAR | Status: AC
  Administered 2011-04-04: 1 mg via INTRAVENOUS
  Filled 2011-04-04: qty 1

## 2011-04-04 MED ORDER — HYDROMORPHONE HCL PF 1 MG/ML IJ SOLN
1.0000 mg | Freq: Once | INTRAMUSCULAR | Status: AC
  Administered 2011-04-04: 1 mg via INTRAVENOUS

## 2011-04-04 MED ORDER — INSULIN ASPART 100 UNIT/ML ~~LOC~~ SOLN
15.0000 [IU] | Freq: Once | SUBCUTANEOUS | Status: AC
  Administered 2011-04-04: 15 [IU] via INTRAVENOUS

## 2011-04-04 MED ORDER — OXYCODONE-ACETAMINOPHEN 10-325 MG PO TABS: 1.0000 | ORAL_TABLET | Freq: Four times a day (QID) | ORAL | Status: AC | PRN

## 2011-04-04 MED ORDER — HYDROMORPHONE HCL PF 1 MG/ML IJ SOLN
INTRAMUSCULAR | Status: AC
  Filled 2011-04-04: qty 1

## 2011-04-04 MED ORDER — SODIUM CHLORIDE 0.9 % IV BOLUS (SEPSIS)
1000.0000 mL | Freq: Once | INTRAVENOUS | Status: AC
  Administered 2011-04-04: 1000 mL via INTRAVENOUS

## 2011-04-04 NOTE — ED Notes (Signed)
Pt c/o pain to right arm and left leg. States pain is similar to last time she had a clot in the leg.

## 2011-04-04 NOTE — ED Provider Notes (Signed)
This chart was scribed for Lisa Diego, MD by [name]. The patient was seen in room APA17/APA17 and the patient's care was started at 11:35 AM.    CSN: [id number]  Arrival date & time 04/04/11  50   First MD Initiated Contact with Patient 04/04/11 1107      No chief complaint on file.   (Consider location/radiation/quality/duration/timing/severity/associated sxs/prior treatment) Patient is a 41 y.o. female presenting with leg pain.  Leg Pain  The incident occurred more than 2 days ago. The incident occurred at home. The pain is present in the left leg. The quality of the pain is described as sharp. The pain is severe. The pain has been worsening since onset. The symptoms are aggravated by palpation. She has tried nothing for the symptoms.    [name] is a 41 y.o. female who presents to the Emergency Department complaining of sudden onset, persistence of constant, moderate to severe pain in left leg and right arm onset 3 days ago.   Leg pain occurs from hip down to toes which is where she had a blood clot previously, pt states that the pain is similar to pain from blood clot. 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  Pt came to the ER Thursday for the same sx night and had an [name] w/ no results, but pt's coumadin rx was increased. Pt ran out of Percocet on Friday which she is supposed to be taking every 6 hours. Pt has been taking extra strength tylenol and tylenol PM w/ no relief. Pt also has elavated blood sugar levels (> 600). There are no other associated symptoms and no other alleviating or aggravating factors. Pt w/ h/o DM, peripheral vascular disease, embolism, renal disorder   PCP: [name] Past Medical History  Diagnosis Date  . Shingles     11/2010  . Cellulitis     of face - 12/2010  . Peripheral vascular disease in diabetes mellitus   . Hyperlipidemia   . Tobacco abuse     0.5 - 1ppd x 21 yrs  . Headache   . Renal  disorder     glomerulonephritis  . Blood transfusion   . Anxiety   . Depression   . Peripheral vascular disease   . Diabetes mellitus     IDDM Since Age 57  . Embolism - blood clot     Past Surgical History  Procedure Date  . Wrist surgery     left  . Embolectomy 01/29/2011    Procedure: EMBOLECTOMY;  Surgeon: Mal Misty, MD;  Location: [facility] OR;  Service: Vascular;  Laterality: Left;  Left Popliteal and Tibial Embolectomy with patch angioplasty  . Dilation and curettage of uterus   . Multiple tooth extractions   . Amputation 03/03/2011    Procedure: AMPUTATION DIGIT;  Surgeon: Newt Minion, MD;  Location: [facility];  Service: Orthopedics;  Laterality: Left;  Left Foot Amputation 4th and 5th toes at MTP joint    Family History  Problem Relation Age of Onset  . COPD Mother     alive - 37  . Hypertension Mother   . Diabetes Mother   . Stroke Mother   . Coronary artery disease Mother   . Diabetes Father     alive - 11  . Hypertension Father   . Coronary artery disease Father   . Anesthesia problems Neg Hx     History  Substance Use Topics  . Smoking  status: Passive Smoker -- 1.0 packs/day for 21 years    Types: Cigarettes    Last Attempt to Quit: 01/30/2011  . Smokeless tobacco: Never Used  . Alcohol Use: No    OB History    Grav Para Term Preterm Abortions TAB SAB Ect Mult Living   1 1  1      1       Review of Systems  Constitutional: Negative for fever and fatigue.  HENT: Negative for congestion, sinus pressure and ear discharge.   Eyes: Negative for pain and discharge.  Respiratory: Negative for cough and wheezing.   Cardiovascular: Negative for chest pain and palpitations.  Gastrointestinal: Negative for abdominal pain and diarrhea.  Genitourinary: Negative for frequency and hematuria.  Musculoskeletal: Negative for back pain.  Skin: Negative for pallor and rash.  Neurological: Negative for seizures and headaches.  Hematological: Negative.     Psychiatric/Behavioral: Negative for hallucinations.   . Allergies  Aspirin and Ibuprofen  Home Medications   Current Outpatient Rx  Name Route Sig Dispense Refill  . ACETAMINOPHEN 500 MG PO TABS Oral Take 1,000 mg by mouth every 6 (six) hours as needed. For pain     . ALPRAZOLAM 1 MG PO TABS Oral Take 1 mg by mouth daily as needed. For anxiety    . AMLODIPINE BESYLATE 2.5 MG PO TABS Oral Take 1 tablet (2.5 mg total) by mouth daily. 30 tablet 5  . DIPHENHYDRAMINE HCL 25 MG PO TABS Oral Take 25 mg by mouth at bedtime as needed. For sleep    . OMEGA-3 FATTY ACIDS 1000 MG PO CAPS Oral Take 1 g by mouth daily.    . FUROSEMIDE 20 MG PO TABS Oral Take 20 mg by mouth 3 (three) times a week. On Mondays, Wednesdays, and Fridays     . INSULIN ASPART 100 UNIT/ML [place] SOLN Subcutaneous Inject 5 Units into the skin 3 (three) times daily before meals. 1 vial 12  . INSULIN GLARGINE 100 UNIT/ML [place] SOLN Subcutaneous Inject 15 Units into the skin at bedtime. 10 mL 12  . THERA M PLUS PO TABS Oral Take 1 tablet by mouth daily.      Marland Kitchen ONDANSETRON 8 MG PO TBDP Oral Take 1 tablet (8 mg total) by mouth every 12 (twelve) hours as needed for nausea. 20 tablet 0  . OXYCODONE-ACETAMINOPHEN 10-325 MG PO TABS Oral Take 1 tablet by mouth every 6 (six) hours as needed for pain. 60 tablet 0  . POTASSIUM CHLORIDE CRYS ER 20 MEQ PO TBCR Oral Take 0.5 tablets (10 mEq total) by mouth daily. 7 tablet 0  . PRAVASTATIN SODIUM 40 MG PO TABS Oral Take 1 tablet (40 mg total) by mouth daily. 30 tablet 5  . SERTRALINE HCL 50 MG PO TABS Oral Take 50 mg by mouth daily.     . WARFARIN SODIUM 1 MG PO TABS Oral Take 3 mg by mouth daily. Takes at 6pm      BP 150/97  Pulse 123  Temp(Src) 98.3 F (36.8 C) (Oral)  Resp 21  Ht 5\' 1"  (1.549 m)  Wt 140 lb (63.504 kg)  BMI 26.45 kg/m2  SpO2 99%  LMP 12/28/2010  Physical Exam  Constitutional: She is oriented to person, place, and time. She appears well-developed.  HENT:  Head:  Normocephalic and atraumatic.  Eyes: Conjunctivae and EOM are normal. No scleral icterus.  Neck: Neck supple. No thyromegaly present.  Cardiovascular: Regular rhythm.  Tachycardia present.  Exam reveals no gallop and  no friction rub.   No murmur heard. Pulmonary/Chest: No stridor. She has no wheezes. She has no rales. She exhibits no tenderness.  Abdominal: She exhibits no distension. There is no tenderness. There is no rebound.  Musculoskeletal: Normal range of motion. She exhibits tenderness. She exhibits no edema.       Very tender to right shoulder, extremeley tender from left knee distally.  Pt had a amputation of the 4th and 5th toes on left side which are healing well.  Neuro vasc normal  Lymphadenopathy:    She has no cervical adenopathy.  Neurological: She is oriented to person, place, and time. Coordination normal.  Skin: No rash noted. No erythema.  Psychiatric: She has a normal mood and affect. Her behavior is normal.    ED Course  Procedures (including critical care time)   DIAGNOSTIC STUDIES: Oxygen Saturation is 99% on room air, normal by my interpretation.    COORDINATION OF CARE:    Labs Reviewed  GLUCOSE, CAPILLARY - Abnormal; Notable for the following:    Glucose-Capillary >600 (*)    All other components within normal limits  CBC - Abnormal; Notable for the following:    WBC 13.4 (*)    RBC 3.59 (*)    Hemoglobin 11.0 (*)    HCT 32.6 (*)    All other components within normal limits  DIFFERENTIAL - Abnormal; Notable for the following:    Neutrophils Relative 79 (*)    Neutro Abs 10.6 (*)    All other components within normal limits  BASIC METABOLIC PANEL - Abnormal; Notable for the following:    Sodium 134 (*)    Glucose, Bld 549 (*) REPEATED TO VERIFY   Calcium 7.8 (*)    GFR calc non Af Amer 72 (*)    GFR calc Af Amer 83 (*)    All other components within normal limits  GLUCOSE, CAPILLARY - Abnormal; Notable for the following:    Glucose-Capillary  472 (*)    All other components within normal limits  GLUCOSE, CAPILLARY - Abnormal; Notable for the following:    Glucose-Capillary 361 (*)    All other components within normal limits  PROTIME-INR   No results found.   No diagnosis found.    MDM  hyperglycemia The chart was scribed for me under my direct supervision.  I personally performed the history, physical, and medical decision making and all procedures in the evaluation of this patient.Lisa Diego, MD 04/04/11 [phone]

## 2011-04-04 NOTE — ED Notes (Signed)
Pt. Sugar is 361, RN Joanna notified.

## 2011-04-04 NOTE — Discharge Instructions (Signed)
Drink plenty of fluids.  Follow up with dr. Luan Pulling tomorrow

## 2011-04-04 NOTE — ED Notes (Signed)
Surgical wound to foot from amputation healing well. No drainage noted.new Kurlex and bandage dressing reapplied after old taken off for examination by EDP.

## 2011-04-04 NOTE — ED Notes (Signed)
Unable to obtain patent IV x 2 attempts. Charge nurse at bedside attempting at this time.

## 2011-04-04 NOTE — ED Notes (Signed)
Blood collected by Dr. Roderic Palau, by femoral stick. Pt medicated for pain

## 2011-04-04 NOTE — ED Notes (Signed)
Complain of pain in left leg and right arm. Was here two day ago for same. Blood sugar 531 per ems

## 2011-04-05 ENCOUNTER — Inpatient Hospital Stay (HOSPITAL_COMMUNITY)
Admission: EM | Admit: 2011-04-05 | Discharge: 2011-04-28 | DRG: 240 | Disposition: A | Discharge status: Skilled Nursing Facility | Payer: Medicaid Other | Attending: Internal Medicine | Admitting: Internal Medicine

## 2011-04-05 ENCOUNTER — Encounter (HOSPITAL_COMMUNITY): Payer: Self-pay

## 2011-04-05 ENCOUNTER — Inpatient Hospital Stay (HOSPITAL_COMMUNITY): Payer: Medicaid Other

## 2011-04-05 ENCOUNTER — Emergency Department (HOSPITAL_COMMUNITY): Payer: Medicaid Other

## 2011-04-05 DIAGNOSIS — R404 Transient alteration of awareness: Secondary | ICD-10-CM | POA: Diagnosis not present

## 2011-04-05 DIAGNOSIS — L03119 Cellulitis of unspecified part of limb: Secondary | ICD-10-CM | POA: Diagnosis present

## 2011-04-05 DIAGNOSIS — I6529 Occlusion and stenosis of unspecified carotid artery: Secondary | ICD-10-CM | POA: Diagnosis present

## 2011-04-05 DIAGNOSIS — I798 Other disorders of arteries, arterioles and capillaries in diseases classified elsewhere: Secondary | ICD-10-CM | POA: Diagnosis present

## 2011-04-05 DIAGNOSIS — M9080 Osteopathy in diseases classified elsewhere, unspecified site: Secondary | ICD-10-CM | POA: Diagnosis present

## 2011-04-05 DIAGNOSIS — I639 Cerebral infarction, unspecified: Secondary | ICD-10-CM

## 2011-04-05 DIAGNOSIS — I743 Embolism and thrombosis of arteries of the lower extremities: Secondary | ICD-10-CM | POA: Diagnosis present

## 2011-04-05 DIAGNOSIS — R9431 Abnormal electrocardiogram [ECG] [EKG]: Secondary | ICD-10-CM | POA: Diagnosis present

## 2011-04-05 DIAGNOSIS — Z720 Tobacco use: Secondary | ICD-10-CM

## 2011-04-05 DIAGNOSIS — E1151 Type 2 diabetes mellitus with diabetic peripheral angiopathy without gangrene: Secondary | ICD-10-CM

## 2011-04-05 DIAGNOSIS — R739 Hyperglycemia, unspecified: Secondary | ICD-10-CM

## 2011-04-05 DIAGNOSIS — Z79899 Other long term (current) drug therapy: Secondary | ICD-10-CM

## 2011-04-05 DIAGNOSIS — Z794 Long term (current) use of insulin: Secondary | ICD-10-CM

## 2011-04-05 DIAGNOSIS — R410 Disorientation, unspecified: Secondary | ICD-10-CM

## 2011-04-05 DIAGNOSIS — F1410 Cocaine abuse, uncomplicated: Secondary | ICD-10-CM | POA: Diagnosis present

## 2011-04-05 DIAGNOSIS — I739 Peripheral vascular disease, unspecified: Secondary | ICD-10-CM | POA: Diagnosis present

## 2011-04-05 DIAGNOSIS — M86179 Other acute osteomyelitis, unspecified ankle and foot: Secondary | ICD-10-CM | POA: Diagnosis present

## 2011-04-05 DIAGNOSIS — I6340 Cerebral infarction due to embolism of unspecified cerebral artery: Secondary | ICD-10-CM | POA: Diagnosis present

## 2011-04-05 DIAGNOSIS — Z9119 Patient's noncompliance with other medical treatment and regimen: Secondary | ICD-10-CM

## 2011-04-05 DIAGNOSIS — IMO0001 Reserved for inherently not codable concepts without codable children: Secondary | ICD-10-CM | POA: Diagnosis present

## 2011-04-05 DIAGNOSIS — F39 Unspecified mood [affective] disorder: Secondary | ICD-10-CM | POA: Diagnosis present

## 2011-04-05 DIAGNOSIS — N179 Acute kidney failure, unspecified: Secondary | ICD-10-CM | POA: Diagnosis not present

## 2011-04-05 DIAGNOSIS — Z7902 Long term (current) use of antithrombotics/antiplatelets: Secondary | ICD-10-CM

## 2011-04-05 DIAGNOSIS — E1059 Type 1 diabetes mellitus with other circulatory complications: Principal | ICD-10-CM | POA: Diagnosis present

## 2011-04-05 DIAGNOSIS — I658 Occlusion and stenosis of other precerebral arteries: Secondary | ICD-10-CM | POA: Diagnosis present

## 2011-04-05 DIAGNOSIS — M86172 Other acute osteomyelitis, left ankle and foot: Secondary | ICD-10-CM | POA: Diagnosis present

## 2011-04-05 DIAGNOSIS — L03116 Cellulitis of left lower limb: Secondary | ICD-10-CM | POA: Diagnosis present

## 2011-04-05 DIAGNOSIS — F411 Generalized anxiety disorder: Secondary | ICD-10-CM | POA: Diagnosis present

## 2011-04-05 DIAGNOSIS — Z7901 Long term (current) use of anticoagulants: Secondary | ICD-10-CM

## 2011-04-05 DIAGNOSIS — I96 Gangrene, not elsewhere classified: Secondary | ICD-10-CM | POA: Diagnosis present

## 2011-04-05 DIAGNOSIS — I2510 Atherosclerotic heart disease of native coronary artery without angina pectoris: Secondary | ICD-10-CM | POA: Diagnosis present

## 2011-04-05 DIAGNOSIS — E1110 Type 2 diabetes mellitus with ketoacidosis without coma: Secondary | ICD-10-CM

## 2011-04-05 DIAGNOSIS — L02619 Cutaneous abscess of unspecified foot: Secondary | ICD-10-CM | POA: Diagnosis present

## 2011-04-05 DIAGNOSIS — Z886 Allergy status to analgesic agent status: Secondary | ICD-10-CM

## 2011-04-05 DIAGNOSIS — D649 Anemia, unspecified: Secondary | ICD-10-CM | POA: Diagnosis not present

## 2011-04-05 DIAGNOSIS — Z91199 Patient's noncompliance with other medical treatment and regimen due to unspecified reason: Secondary | ICD-10-CM

## 2011-04-05 DIAGNOSIS — Z8673 Personal history of transient ischemic attack (TIA), and cerebral infarction without residual deficits: Secondary | ICD-10-CM

## 2011-04-05 DIAGNOSIS — E785 Hyperlipidemia, unspecified: Secondary | ICD-10-CM | POA: Diagnosis present

## 2011-04-05 DIAGNOSIS — I998 Other disorder of circulatory system: Secondary | ICD-10-CM | POA: Diagnosis present

## 2011-04-05 DIAGNOSIS — F17200 Nicotine dependence, unspecified, uncomplicated: Secondary | ICD-10-CM | POA: Diagnosis present

## 2011-04-05 LAB — CULTURE, BLOOD (ROUTINE X 2): Culture: NO GROWTH

## 2011-04-05 LAB — DIFFERENTIAL
Basophils Absolute: 0 10*3/uL (ref 0.0–0.1)
Basophils Relative: 0 % (ref 0–1)
Eosinophils Absolute: 0 10*3/uL (ref 0.0–0.7)
Eosinophils Relative: 0 % (ref 0–5)
Lymphocytes Relative: 15 % (ref 12–46)
Lymphs Abs: 2.4 10*3/uL (ref 0.7–4.0)
Monocytes Absolute: 1.2 10*3/uL — ABNORMAL HIGH (ref 0.1–1.0)
Monocytes Relative: 7 % (ref 3–12)
Neutro Abs: 12.3 10*3/uL — ABNORMAL HIGH (ref 1.7–7.7)
Neutrophils Relative %: 78 % — ABNORMAL HIGH (ref 43–77)

## 2011-04-05 LAB — GLUCOSE, CAPILLARY
Glucose-Capillary: >600 mg/dL (ref 70–99)
Glucose-Capillary: 522 mg/dL — ABNORMAL HIGH (ref 70–99)
Glucose-Capillary: 379 mg/dL — ABNORMAL HIGH (ref 70–99)
Glucose-Capillary: 261 mg/dL — ABNORMAL HIGH (ref 70–99)
Glucose-Capillary: 152 mg/dL — ABNORMAL HIGH (ref 70–99)

## 2011-04-05 LAB — PROTIME-INR
INR: 1.69 — ABNORMAL HIGH (ref 0.00–1.49)
Prothrombin Time: 20.2 seconds — ABNORMAL HIGH (ref 11.6–15.2)

## 2011-04-05 LAB — CBC
HCT: 34.9 % — ABNORMAL LOW (ref 36.0–46.0)
Hemoglobin: 11.4 g/dL — ABNORMAL LOW (ref 12.0–15.0)
MCH: 30.3 pg (ref 26.0–34.0)
MCHC: 32.7 g/dL (ref 30.0–36.0)
MCV: 92.8 fL (ref 78.0–100.0)
Platelets: 309 10*3/uL (ref 150–400)
RBC: 3.76 MIL/uL — ABNORMAL LOW (ref 3.87–5.11)
RDW: 15.3 % (ref 11.5–15.5)
WBC: 15.9 10*3/uL — ABNORMAL HIGH (ref 4.0–10.5)

## 2011-04-05 LAB — BASIC METABOLIC PANEL
BUN: 20 mg/dL (ref 6–23)
CO2: 18 mEq/L — ABNORMAL LOW (ref 19–32)
Calcium: 8.2 mg/dL — ABNORMAL LOW (ref 8.4–10.5)
Chloride: 97 mEq/L (ref 96–112)
Creatinine, Ser: 1.23 mg/dL — ABNORMAL HIGH (ref 0.50–1.10)
GFR calc Af Amer: 62 mL/min — ABNORMAL LOW (ref >90)
GFR calc non Af Amer: 54 mL/min — ABNORMAL LOW (ref >90)
Glucose, Bld: 692 mg/dL (ref 70–99)
Potassium: 4.6 mEq/L (ref 3.5–5.1)
Sodium: 131 mEq/L — ABNORMAL LOW (ref 135–145)

## 2011-04-05 MED ORDER — SODIUM CHLORIDE 0.9 % IV SOLN
INTRAVENOUS | Status: DC
  Administered 2011-04-05: 20:00:00 via INTRAVENOUS

## 2011-04-05 MED ORDER — OXYCODONE-ACETAMINOPHEN 10-325 MG PO TABS: 1.0000 | ORAL_TABLET | Freq: Four times a day (QID) | ORAL | Status: DC | PRN

## 2011-04-05 MED ORDER — INSULIN REGULAR BOLUS VIA INFUSION
0.0000 [IU] | Freq: Three times a day (TID) | INTRAVENOUS | Status: DC
  Filled 2011-04-05: qty 10

## 2011-04-05 MED ORDER — OMEGA-3-ACID ETHYL ESTERS 1 G PO CAPS
1.0000 g | ORAL_CAPSULE | Freq: Every day | ORAL | Status: DC
  Administered 2011-04-06 – 2011-04-28 (×22): 1 g via ORAL
  Filled 2011-04-05 (×24): qty 1

## 2011-04-05 MED ORDER — OXYCODONE-ACETAMINOPHEN 5-325 MG PO TABS
2.0000 | ORAL_TABLET | Freq: Four times a day (QID) | ORAL | Status: DC | PRN
  Administered 2011-04-05 – 2011-04-08 (×7): 2 via ORAL
  Filled 2011-04-05 (×6): qty 2

## 2011-04-05 MED ORDER — DEXTROSE 50 % IV SOLN
25.0000 mL | INTRAVENOUS | Status: DC | PRN
  Administered 2011-04-14 – 2011-04-19 (×4): 25 mL via INTRAVENOUS
  Filled 2011-04-05 (×3): qty 50

## 2011-04-05 MED ORDER — ALPRAZOLAM 0.5 MG PO TABS
1.0000 mg | ORAL_TABLET | Freq: Three times a day (TID) | ORAL | Status: DC | PRN
  Administered 2011-04-06 – 2011-04-08 (×4): 1 mg via ORAL
  Administered 2011-04-09: 0.5 mg via ORAL
  Administered 2011-04-09 – 2011-04-28 (×29): 1 mg via ORAL
  Filled 2011-04-05 (×2): qty 1
  Filled 2011-04-05 (×3): qty 2
  Filled 2011-04-05 (×2): qty 1
  Filled 2011-04-05: qty 2
  Filled 2011-04-05: qty 1
  Filled 2011-04-05: qty 2
  Filled 2011-04-05: qty 1
  Filled 2011-04-05: qty 2
  Filled 2011-04-05 (×2): qty 1
  Filled 2011-04-05: qty 2
  Filled 2011-04-05 (×4): qty 1
  Filled 2011-04-05: qty 2
  Filled 2011-04-05 (×5): qty 1
  Filled 2011-04-05 (×2): qty 2
  Filled 2011-04-05: qty 1
  Filled 2011-04-05: qty 2
  Filled 2011-04-05 (×3): qty 1
  Filled 2011-04-05: qty 2
  Filled 2011-04-05 (×4): qty 1
  Filled 2011-04-05: qty 2

## 2011-04-05 MED ORDER — SODIUM CHLORIDE 0.9 % IV SOLN
INTRAVENOUS | Status: DC
  Administered 2011-04-05: 3.2 [IU]/h via INTRAVENOUS
  Filled 2011-04-05: qty 1

## 2011-04-05 MED ORDER — HYDROMORPHONE HCL PF 1 MG/ML IJ SOLN
1.0000 mg | Freq: Once | INTRAMUSCULAR | Status: AC
  Administered 2011-04-05: 1 mg via INTRAVENOUS
  Filled 2011-04-05: qty 1

## 2011-04-05 MED ORDER — ONDANSETRON HCL 4 MG/2ML IJ SOLN
4.0000 mg | Freq: Once | INTRAMUSCULAR | Status: AC
  Administered 2011-04-05: 4 mg via INTRAVENOUS
  Filled 2011-04-05: qty 2

## 2011-04-05 MED ORDER — DEXTROSE 50 % IV SOLN
25.0000 mL | INTRAVENOUS | Status: DC | PRN
  Administered 2011-04-12 – 2011-04-14 (×2): 25 mL via INTRAVENOUS

## 2011-04-05 MED ORDER — SODIUM CHLORIDE 0.9 % IV SOLN
INTRAVENOUS | Status: DC
  Administered 2011-04-05: 23:00:00 via INTRAVENOUS

## 2011-04-05 MED ORDER — INSULIN GLARGINE 100 UNIT/ML ~~LOC~~ SOLN: 15.0000 [IU] | Freq: Every day | SUBCUTANEOUS | Status: DC

## 2011-04-05 MED ORDER — ACETAMINOPHEN 500 MG PO TABS
1000.0000 mg | ORAL_TABLET | Freq: Four times a day (QID) | ORAL | Status: DC | PRN
  Administered 2011-04-18 – 2011-04-19 (×2): 1000 mg via ORAL
  Filled 2011-04-05 (×4): qty 2

## 2011-04-05 MED ORDER — POTASSIUM CHLORIDE CRYS ER 20 MEQ PO TBCR
10.0000 meq | EXTENDED_RELEASE_TABLET | Freq: Every day | ORAL | Status: DC
  Administered 2011-04-06 – 2011-04-08 (×3): 10 meq via ORAL
  Filled 2011-04-05 (×3): qty 1

## 2011-04-05 MED ORDER — ADULT MULTIVITAMIN W/MINERALS CH
1.0000 | ORAL_TABLET | Freq: Every day | ORAL | Status: DC
  Administered 2011-04-06 – 2011-04-11 (×6): 1 via ORAL
  Administered 2011-04-12: 11:00:00 via ORAL
  Administered 2011-04-13 – 2011-04-28 (×15): 1 via ORAL
  Filled 2011-04-05 (×23): qty 1

## 2011-04-05 MED ORDER — VANCOMYCIN HCL IN DEXTROSE 1-5 GM/200ML-% IV SOLN
INTRAVENOUS | Status: AC
  Filled 2011-04-05: qty 200

## 2011-04-05 MED ORDER — WARFARIN SODIUM 1 MG PO TABS
3.0000 mg | ORAL_TABLET | Freq: Every day | ORAL | Status: DC
  Administered 2011-04-06 – 2011-04-14 (×9): 3 mg via ORAL
  Filled 2011-04-05 (×2): qty 3
  Filled 2011-04-05: qty 1
  Filled 2011-04-05 (×7): qty 3
  Filled 2011-04-05: qty 1
  Filled 2011-04-05 (×2): qty 3

## 2011-04-05 MED ORDER — DIPHENHYDRAMINE HCL 25 MG PO CAPS
25.0000 mg | ORAL_CAPSULE | Freq: Every evening | ORAL | Status: DC | PRN
  Administered 2011-04-06 – 2011-04-17 (×6): 25 mg via ORAL
  Filled 2011-04-05 (×7): qty 1

## 2011-04-05 MED ORDER — SIMVASTATIN 20 MG PO TABS
20.0000 mg | ORAL_TABLET | Freq: Every day | ORAL | Status: DC
  Administered 2011-04-06 – 2011-04-27 (×21): 20 mg via ORAL
  Filled 2011-04-05 (×23): qty 1

## 2011-04-05 MED ORDER — INSULIN REGULAR HUMAN 100 UNIT/ML IJ SOLN
INTRAMUSCULAR | Status: AC
  Filled 2011-04-05: qty 3

## 2011-04-05 MED ORDER — SERTRALINE HCL 50 MG PO TABS
50.0000 mg | ORAL_TABLET | Freq: Every day | ORAL | Status: DC
  Administered 2011-04-06 – 2011-04-28 (×21): 50 mg via ORAL
  Filled 2011-04-05 (×24): qty 1

## 2011-04-05 MED ORDER — ONDANSETRON 8 MG PO TBDP
8.0000 mg | ORAL_TABLET | Freq: Three times a day (TID) | ORAL | Status: DC | PRN
  Administered 2011-04-08: 8 mg via ORAL
  Filled 2011-04-05: qty 1

## 2011-04-05 MED ORDER — SODIUM CHLORIDE 0.9 % IV SOLN: INTRAVENOUS | Status: DC

## 2011-04-05 MED ORDER — SODIUM CHLORIDE 0.9 % IV BOLUS (SEPSIS)
1000.0000 mL | Freq: Once | INTRAVENOUS | Status: AC
  Administered 2011-04-05: 1000 mL via INTRAVENOUS

## 2011-04-05 MED ORDER — OXYCODONE-ACETAMINOPHEN 5-325 MG PO TABS
ORAL_TABLET | ORAL | Status: AC
  Administered 2011-04-05: 1
  Filled 2011-04-05: qty 1

## 2011-04-05 MED ORDER — INSULIN ASPART 100 UNIT/ML ~~LOC~~ SOLN: 10.0000 [IU] | Freq: Once | SUBCUTANEOUS | Status: DC

## 2011-04-05 MED ORDER — INSULIN ASPART 100 UNIT/ML ~~LOC~~ SOLN
10.0000 [IU] | Freq: Once | SUBCUTANEOUS | Status: AC
  Administered 2011-04-05: 10 [IU] via SUBCUTANEOUS

## 2011-04-05 MED ORDER — DEXTROSE-NACL 5-0.45 % IV SOLN
INTRAVENOUS | Status: DC
  Administered 2011-04-05: 75 mL/h via INTRAVENOUS
  Administered 2011-04-07: 06:00:00 via INTRAVENOUS

## 2011-04-05 MED ORDER — VANCOMYCIN HCL IN DEXTROSE 1-5 GM/200ML-% IV SOLN
1000.0000 mg | Freq: Two times a day (BID) | INTRAVENOUS | Status: DC
  Administered 2011-04-05 – 2011-04-06 (×3): 1000 mg via INTRAVENOUS
  Filled 2011-04-05 (×4): qty 200

## 2011-04-05 MED ORDER — INSULIN ASPART 100 UNIT/ML ~~LOC~~ SOLN: 5.0000 [IU] | Freq: Three times a day (TID) | SUBCUTANEOUS | Status: DC

## 2011-04-05 MED ORDER — AMLODIPINE BESYLATE 2.5 MG PO TABS
2.5000 mg | ORAL_TABLET | Freq: Every day | ORAL | Status: DC
  Administered 2011-04-06 – 2011-04-18 (×12): 2.5 mg via ORAL
  Filled 2011-04-05 (×14): qty 1

## 2011-04-05 MED ORDER — PIPERACILLIN-TAZOBACTAM 3.375 G IVPB
INTRAVENOUS | Status: AC
  Filled 2011-04-05: qty 100

## 2011-04-05 MED ORDER — PIPERACILLIN-TAZOBACTAM 3.375 G IVPB
3.3750 g | Freq: Three times a day (TID) | INTRAVENOUS | Status: DC
  Administered 2011-04-05 – 2011-04-11 (×17): 3.375 g via INTRAVENOUS
  Filled 2011-04-05 (×20): qty 50

## 2011-04-05 MED ORDER — INSULIN REGULAR HUMAN 100 UNIT/ML IJ SOLN
10.0000 [IU] | Freq: Once | INTRAMUSCULAR | Status: DC
  Filled 2011-04-05: qty 0.1

## 2011-04-05 MED ORDER — FUROSEMIDE 20 MG PO TABS
20.0000 mg | ORAL_TABLET | ORAL | Status: DC
  Administered 2011-04-07 – 2011-04-19 (×6): 20 mg via ORAL
  Filled 2011-04-05: qty 1
  Filled 2011-04-05 (×2): qty 2
  Filled 2011-04-05 (×4): qty 1

## 2011-04-05 NOTE — ED Notes (Signed)
CRITICAL VALUE ALERT  Critical value received: glucose 692  Date of notification:  3/18  Time of notification:  16:30  Critical value read back:yes--692  Nurse who received alert:  ty MD notified (1st page):  16:30  Time of first page:    MD notified (2nd page):  Time of second page:  Responding MD:   Time MD responded:

## 2011-04-05 NOTE — Progress Notes (Signed)
ANTIBIOTIC CONSULT NOTE - INITIAL  Pharmacy Consult for Vancomycin and Zosyn Indication: cellulitis of face vs osteomyelitis of foot  Allergies  Allergen Reactions  . Aspirin Other (See Comments)    Cannot take this medication due to kidney disease  . Ibuprofen Other (See Comments)    Kidney disease    Patient Measurements: Height: 5\' 2"  (157.5 cm) Weight: 140 lb (63.504 kg) IBW/kg (Calculated) : 50.1   Vital Signs: Temp: 98.7 F (37.1 C) (03/18 1414) Temp src: Oral (03/18 1414) BP: 120/77 mmHg (03/18 2200) Pulse Rate: 100  (03/18 2200) Intake/Output from previous day:   Intake/Output from this shift:    Labs:  Basename 04/05/11 1524 04/04/11 1230  WBC 15.9* 13.4*  HGB 11.4* 11.0*  PLT 309 150  LABCREA -- --  CREATININE 1.23* 0.97   Estimated Creatinine Clearance: 52.7 ml/min (by C-G formula based on Cr of 1.23). No results found for this basename: VANCOTROUGH:2,VANCOPEAK:2,VANCORANDOM:2,GENTTROUGH:2,GENTPEAK:2,GENTRANDOM:2,TOBRATROUGH:2,TOBRAPEAK:2,TOBRARND:2,AMIKACINPEAK:2,AMIKACINTROU:2,AMIKACIN:2, in the last 72 hours   Microbiology: Recent Results (from the past 720 hour(s))  MRSA PCR SCREENING     Status: Normal   Collection Time   03/28/11 12:00 AM      Component Value Range Status Comment   MRSA by PCR NEGATIVE  NEGATIVE  Final   URINE CULTURE     Status: Normal   Collection Time   03/29/11  7:30 PM      Component Value Range Status Comment   Specimen Description URINE, CLEAN CATCH   Final    Special Requests NONE   Final    Culture  Setup Time [id number]   Final    Colony Count NO GROWTH   Final    Culture NO GROWTH   Final    Report Status 03/30/2011 FINAL   Final   CULTURE, BLOOD (ROUTINE X 2)     Status: Normal   Collection Time   03/31/11 10:55 AM      Component Value Range Status Comment   Specimen Description BLOOD LEFT HAND   Final    Special Requests BOTTLES DRAWN AEROBIC ONLY 4CC   Final    Culture NO GROWTH 5 DAYS   Final    Report  Status 04/05/2011 FINAL   Final     Medical History: Past Medical History  Diagnosis Date  . Shingles     11/2010  . Cellulitis     of face - 12/2010  . Peripheral vascular disease in diabetes mellitus   . Hyperlipidemia   . Tobacco abuse     0.5 - 1ppd x 21 yrs  . Headache   . Renal disorder     glomerulonephritis  . Blood transfusion   . Anxiety   . Depression   . Peripheral vascular disease   . Diabetes mellitus     IDDM Since Age 54  . Embolism - blood clot     Medications:  Scheduled:    . amLODipine  2.5 mg Oral Daily  . fish oil-omega-3 fatty acids  1 g Oral Daily  . furosemide  20 mg Oral 3 times weekly  . HYDROmorphone  1 mg Intravenous Once  . insulin aspart  10 Units Subcutaneous Once  . insulin regular  10 Units Subcutaneous Once  . insulin regular  0-10 Units Intravenous TID WC  . multivitamins ther. w/minerals  1 tablet Oral Daily  . ondansetron  4 mg Intravenous Once  . oxyCODONE-acetaminophen      . potassium chloride SA  10 mEq Oral Daily  .  sertraline  50 mg Oral Daily  . simvastatin  5 mg Oral q1800  . sodium chloride  1,000 mL Intravenous Once  . warfarin  3 mg Oral Daily  . DISCONTD: insulin aspart  10 Units Subcutaneous Once  . DISCONTD: insulin aspart  5 Units Subcutaneous TID AC  . DISCONTD: insulin glargine  15 Units Subcutaneous QHS   Assessment: Ok for protocol   Goal of Therapy:  Vancomycin trough level 15-20 mcg/ml  Plan: Vancomycin 1 Gm IV every 12 hours Zosyn 3.375 GM IV every 8 hours Monitor labs per protocol Measure antibiotic drug levels at steady state  Roshon Duell [place] [employer] 04/05/2011,10:33 PM

## 2011-04-05 NOTE — ED Notes (Signed)
Attempted to call report to ICU.

## 2011-04-05 NOTE — ED Notes (Signed)
Not feeling good for 2 days, bs reading high at home arrived by ems

## 2011-04-05 NOTE — ED Provider Notes (Signed)
History   This chart was scribed for Mervin Kung, MD by Kathreen Cornfield. The patient was seen in room APA11/APA11 and the patient's care was started at 3:50PM.    CSN: [id number]  Arrival date & time 04/05/11  36   First MD Initiated Contact with Patient 04/05/11 1514      Chief Complaint  Patient presents with  . Hyperglycemia    (Consider location/radiation/quality/duration/timing/severity/associated sxs/prior treatment) HPI  KAELAN BRANDLI is a 41 y.o. female who, brought in by EMS,  presents to the Emergency Department complaining of moderate, constant leg pain located in the left leg onset 9 days ago with associated symptoms of arm pain, restricted range of motion, sore throat, nausea, vomiting. Pt states she had a fall about a week ago, she now has pain in her right arm. Pt states "the pain feels as if she is having another blood clot in her leg". Pt states "Dr. Luan Pulling referred her to the Emergency room to have x-rays performed". Pt also complains of moderate, constant hyperglycemia onset today.Pt states "she was here at [facility] yesterday for high blood sugar, where which she was given an IV". Modifying factors include certain movements which intensify the leg pain. Pt has a Hx of admission on 03/27/11 for allergic rxn, given steroids and antibiotics, Blood clot in left leg, January, Dr. Kellie Simmering in [place] removed it. Pt had toes removed in February, Diabetes, kidney disease.   Pt denies  diarrhea, , chest pain, dysuria,   PCP is Dr. Luan Pulling. Vascular surgeon is Dr. Kellie Simmering in [place], [place].   Past Medical History  Diagnosis Date  . Shingles     11/2010  . Cellulitis     of face - 12/2010  . Peripheral vascular disease in diabetes mellitus   . Hyperlipidemia   . Tobacco abuse     0.5 - 1ppd x 21 yrs  . Headache   . Renal disorder     glomerulonephritis  . Blood transfusion   . Anxiety   . Depression   . Peripheral vascular disease   . Diabetes mellitus     IDDM Since  Age 68  . Embolism - blood clot     Past Surgical History  Procedure Date  . Wrist surgery     left  . Embolectomy 01/29/2011    Procedure: EMBOLECTOMY;  Surgeon: Mal Misty, MD;  Location: [facility] OR;  Service: Vascular;  Laterality: Left;  Left Popliteal and Tibial Embolectomy with patch angioplasty  . Dilation and curettage of uterus   . Multiple tooth extractions   . Amputation 03/03/2011    Procedure: AMPUTATION DIGIT;  Surgeon: Newt Minion, MD;  Location: [facility];  Service: Orthopedics;  Laterality: Left;  Left Foot Amputation 4th and 5th toes at MTP joint    Family History  Problem Relation Age of Onset  . COPD Mother     alive - 49  . Hypertension Mother   . Diabetes Mother   . Stroke Mother   . Coronary artery disease Mother   . Diabetes Father     alive - 50  . Hypertension Father   . Coronary artery disease Father   . Anesthesia problems Neg Hx     History  Substance Use Topics  . Smoking status: Passive Smoker -- 1.0 packs/day for 21 years    Types: Cigarettes    Last Attempt to Quit: 01/30/2011  . Smokeless tobacco: Never Used  . Alcohol Use: No    OB  History    Grav Para Term Preterm Abortions TAB SAB Ect Mult Living   1 1  1      1       Review of Systems  All other systems reviewed and are negative.    10 Systems reviewed and are negative for acute change except as noted in the HPI.   Allergies  Aspirin and Ibuprofen  Home Medications   Current Outpatient Rx  Name Route Sig Dispense Refill  . ACETAMINOPHEN 500 MG PO TABS Oral Take 1,000 mg by mouth every 6 (six) hours as needed. For pain     . ALPRAZOLAM 1 MG PO TABS Oral Take 1 mg by mouth daily as needed. For anxiety    . AMLODIPINE BESYLATE 2.5 MG PO TABS Oral Take 1 tablet (2.5 mg total) by mouth daily. 30 tablet 5  . CEPHALEXIN 500 MG PO CAPS Oral Take 500 mg by mouth 3 (three) times daily.    Marland Kitchen DIPHENHYDRAMINE HCL 25 MG PO TABS Oral Take 25 mg by mouth at bedtime as needed. For sleep     . OMEGA-3 FATTY ACIDS 1000 MG PO CAPS Oral Take 1 g by mouth daily.    . FUROSEMIDE 20 MG PO TABS Oral Take 20 mg by mouth 3 (three) times a week. On Mondays, Wednesdays, and Fridays    . INSULIN ASPART 100 UNIT/ML [place] SOLN Subcutaneous Inject 5 Units into the skin 3 (three) times daily before meals. 1 vial 12  . INSULIN GLARGINE 100 UNIT/ML [place] SOLN Subcutaneous Inject 15 Units into the skin at bedtime. 10 mL 12  . THERA M PLUS PO TABS Oral Take 1 tablet by mouth daily.      Marland Kitchen ONDANSETRON 8 MG PO TBDP Oral Take 8 mg by mouth every 12 (twelve) hours as needed. Nausea    . OXYCODONE-ACETAMINOPHEN 10-325 MG PO TABS Oral Take 1 tablet by mouth every 6 (six) hours as needed for pain. 30 tablet 0  . POTASSIUM CHLORIDE CRYS ER 20 MEQ PO TBCR Oral Take 0.5 tablets (10 mEq total) by mouth daily. 7 tablet 0  . PRAVASTATIN SODIUM 40 MG PO TABS Oral Take 1 tablet (40 mg total) by mouth daily. 30 tablet 5  . SERTRALINE HCL 50 MG PO TABS Oral Take 50 mg by mouth daily.     . WARFARIN SODIUM 3 MG PO TABS Oral Take 3 mg by mouth daily.      BP 127/68  Pulse 82  Temp(Src) 98.7 F (37.1 C) (Oral)  Resp 18  Ht 5\' 2"  (1.575 m)  Wt 140 lb (63.504 kg)  BMI 25.61 kg/m2  SpO2 100%  LMP 12/28/2010  Physical Exam  Nursing note and vitals reviewed. Constitutional: She is oriented to person, place, and time. She appears well-developed and well-nourished.  HENT:  Head: Normocephalic and atraumatic.  Right Ear: External ear normal.  Left Ear: External ear normal.  Nose: Nose normal.  Eyes: Conjunctivae and EOM are normal. No scleral icterus.  Neck: Normal range of motion. Neck supple. No thyromegaly present.  Cardiovascular: Normal rate, regular rhythm, normal heart sounds and intact distal pulses.  Exam reveals no gallop and no friction rub.   No murmur heard. Pulmonary/Chest: Breath sounds normal. No stridor. She has no wheezes. She has no rales. She exhibits no tenderness.  Abdominal: Bowel sounds are  normal. She exhibits no distension. There is no tenderness. There is no rebound.  Musculoskeletal: Normal range of motion. She exhibits no edema.  Pt has had two toes removed on the left foot. Discoloration to the big toe on the left, top of foot. Family member states "the foot looks better than it did in the past".  No graft in left leg. Muscle atrophy of the left calf.   Lymphadenopathy:    She has no cervical adenopathy.  Neurological: She is alert and oriented to person, place, and time. Coordination normal.  Skin: Skin is dry. No rash noted. There is erythema.  Psychiatric: She has a normal mood and affect. Her behavior is normal.    ED Course  Procedures (including critical care time)  DIAGNOSTIC STUDIES: Oxygen Saturation is 100% on room air, normal by my interpretation.    COORDINATION OF CARE:  Results for orders placed during the hospital encounter of 04/05/11  GLUCOSE, CAPILLARY      Component Value Range   Glucose-Capillary >600 (*) 70 - 99 (mg/dL)  CBC      Component Value Range   WBC 15.9 (*) 4.0 - 10.5 (K/uL)   RBC 3.76 (*) 3.87 - 5.11 (MIL/uL)   Hemoglobin 11.4 (*) 12.0 - 15.0 (g/dL)   HCT 34.9 (*) 36.0 - 46.0 (%)   MCV 92.8  78.0 - 100.0 (fL)   MCH 30.3  26.0 - 34.0 (pg)   MCHC 32.7  30.0 - 36.0 (g/dL)   RDW 15.3  11.5 - 15.5 (%)   Platelets 309  150 - 400 (K/uL)  DIFFERENTIAL      Component Value Range   Neutrophils Relative 78 (*) 43 - 77 (%)   Neutro Abs 12.3 (*) 1.7 - 7.7 (K/uL)   Lymphocytes Relative 15  12 - 46 (%)   Lymphs Abs 2.4  0.7 - 4.0 (K/uL)   Monocytes Relative 7  3 - 12 (%)   Monocytes Absolute 1.2 (*) 0.1 - 1.0 (K/uL)   Eosinophils Relative 0  0 - 5 (%)   Eosinophils Absolute 0.0  0.0 - 0.7 (K/uL)   Basophils Relative 0  0 - 1 (%)   Basophils Absolute 0.0  0.0 - 0.1 (K/uL)  BASIC METABOLIC PANEL      Component Value Range   Sodium 131 (*) 135 - 145 (mEq/L)   Potassium 4.6  3.5 - 5.1 (mEq/L)   Chloride 97  96 - 112 (mEq/L)   CO2  18 (*) 19 - 32 (mEq/L)   Glucose, Bld 692 (*) 70 - 99 (mg/dL)   BUN 20  6 - 23 (mg/dL)   Creatinine, Ser 1.23 (*) 0.50 - 1.10 (mg/dL)   Calcium 8.2 (*) 8.4 - 10.5 (mg/dL)   GFR calc non Af Amer 54 (*) >90 (mL/min)   GFR calc Af Amer 62 (*) >90 (mL/min)  GLUCOSE, CAPILLARY      Component Value Range   Glucose-Capillary 522 (*) 70 - 99 (mg/dL)   Comment 1 Notify RN     Dg Femur Left  04/05/2011  *RADIOLOGY REPORT*  Clinical Data: Status post fall.  Pain.  LEFT FEMUR - 2 VIEW  Comparison: None.  Findings: No acute bony or joint abnormality is identified.  The hip and knee are located.  Soft tissues are unremarkable.  IMPRESSION: Negative exam.  Original Report Authenticated By: Arvid Right. D'ALESSIO, M.D.   Dg Tibia/fibula Left  04/05/2011  *RADIOLOGY REPORT*  Clinical Data: Recent fall.  LEFT TIBIA AND FIBULA - 2 VIEW  Comparison: None.  Findings: There are no fractures or dislocations.  The bones appear intrinsically normal as do the soft  tissues.  IMPRESSION: Negative study.  Original Report Authenticated By: Altamese Cabal, M.D.   Dg Humerus Right  04/05/2011  *RADIOLOGY REPORT*  Clinical Data: Recent fall, hyperglycemia  RIGHT HUMERUS - 2+ VIEW  Comparison: None.  Findings: No acute fracture is seen.  The right shoulder is unremarkable other than a somewhat downward sloping acromion.  IMPRESSION: No acute fracture.  Original Report Authenticated By: Joretta Bachelor, M.D.   Dg Foot Complete Left  04/05/2011  *RADIOLOGY REPORT*  Clinical Data: History of blood clot left leg.  Fall.  LEFT FOOT - COMPLETE 3+ VIEW  Comparison: None.  Findings: There is been previous amputation of the left fourth and fifth toes at the metatarsal phalangeal joint level. There is an erosion associated with the dorsal portion of the cuboid bone seen on the lateral view. There is a possible subtle fracture of the medial aspect of the navicular bone versus variant anatomy. Recommend correlation with physical examination  findings and possibly CT.  IMPRESSION: Previous amputations of the left fourth and fifth toes at the metatarsophalangeal joint levels. Possible fracture of the navicular bone versus variant anatomy.  Suggest correlation with physical examination findings and possibly CT if felt to be indicated. Erosion associated with the cuboid bone as discussed above.  Original Report Authenticated By: Altamese Cabal, M.D.      1. Hyperglycemia   2. DKA (diabetic ketoacidoses)     4:00PM- EDP at bedside discusses treatment plan.    CRITICAL CARE Performed by: Mervin Kung.   Total critical care time: 30  Critical care time was exclusive of separately billable procedures and treating other patients.  Critical care was necessary to treat or prevent imminent or life-threatening deterioration.  Critical care was time spent personally by me on the following activities: development of treatment plan with patient and/or surrogate as well as nursing, discussions with consultants, evaluation of patient's response to treatment, examination of patient, obtaining history from patient or surrogate, ordering and performing treatments and interventions, ordering and review of laboratory studies, ordering and review of radiographic studies, pulse oximetry and re-evaluation of patient's condition.  MDM  The patient will be admitted for hyperglycemia and DKA patient started on non-glucose stabilizer protocol to control this and IV fluid hydration blood sugar recently was over 600 CO2 is measuring 18 to change from her visit yesterday. Discussed with Dr. Karie Kirks who will that for Dr. Luan Pulling. Also workup of left leg pain raises some concern for fracture of the left foot no evidence of any injury to the right arm her she also has long-standing complaint. Right foot most likely has significant vascular insufficiency but family states no significant change it to anything it looks better do not feel that there is need  for acute vascular surgical intervention at this time.    I personally performed the services described in this documentation, which was scribed in my presence. The recorded information has been reviewed and considered.     Mervin Kung, MD 04/05/11 [phone]

## 2011-04-05 NOTE — ED Notes (Signed)
IV access lost. Unable to gain new access. Dr Rogene Houston aware, states he does not want to start EJ IV or Central in ED. ICU called to see if RN can come attempt IV.

## 2011-04-05 NOTE — ED Notes (Signed)
Dr. Zackowski at bedside  

## 2011-04-06 ENCOUNTER — Inpatient Hospital Stay (HOSPITAL_COMMUNITY): Payer: Medicaid Other

## 2011-04-06 LAB — GLUCOSE, CAPILLARY
Glucose-Capillary: 125 mg/dL — ABNORMAL HIGH (ref 70–99)
Glucose-Capillary: 135 mg/dL — ABNORMAL HIGH (ref 70–99)
Glucose-Capillary: 135 mg/dL — ABNORMAL HIGH (ref 70–99)
Glucose-Capillary: 150 mg/dL — ABNORMAL HIGH (ref 70–99)
Glucose-Capillary: 153 mg/dL — ABNORMAL HIGH (ref 70–99)
Glucose-Capillary: 161 mg/dL — ABNORMAL HIGH (ref 70–99)
Glucose-Capillary: 162 mg/dL — ABNORMAL HIGH (ref 70–99)
Glucose-Capillary: 165 mg/dL — ABNORMAL HIGH (ref 70–99)
Glucose-Capillary: 169 mg/dL — ABNORMAL HIGH (ref 70–99)
Glucose-Capillary: 173 mg/dL — ABNORMAL HIGH (ref 70–99)
Glucose-Capillary: 63 mg/dL — ABNORMAL LOW (ref 70–99)

## 2011-04-06 LAB — BASIC METABOLIC PANEL
BUN: 17 mg/dL (ref 6–23)
CO2: 24 mEq/L (ref 19–32)
Calcium: 7.5 mg/dL — ABNORMAL LOW (ref 8.4–10.5)
Chloride: 103 mEq/L (ref 96–112)
Creatinine, Ser: 1.12 mg/dL — ABNORMAL HIGH (ref 0.50–1.10)
GFR calc Af Amer: 70 mL/min — ABNORMAL LOW (ref >90)
GFR calc non Af Amer: 60 mL/min — ABNORMAL LOW (ref >90)
Glucose, Bld: 164 mg/dL — ABNORMAL HIGH (ref 70–99)
Potassium: 3.6 mEq/L (ref 3.5–5.1)
Sodium: 134 mEq/L — ABNORMAL LOW (ref 135–145)

## 2011-04-06 LAB — PROTIME-INR
INR: 1.49 (ref 0.00–1.49)
Prothrombin Time: 18.3 seconds — ABNORMAL HIGH (ref 11.6–15.2)

## 2011-04-06 LAB — MRSA PCR SCREENING: MRSA by PCR: NEGATIVE

## 2011-04-06 MED ORDER — INSULIN GLARGINE 100 UNIT/ML ~~LOC~~ SOLN
10.0000 [IU] | Freq: Every day | SUBCUTANEOUS | Status: DC
  Administered 2011-04-06: 10 [IU] via SUBCUTANEOUS

## 2011-04-06 MED ORDER — HYDROMORPHONE HCL PF 1 MG/ML IJ SOLN
1.0000 mg | INTRAMUSCULAR | Status: DC | PRN
Start: 2011-04-06 — End: 2011-04-07
  Administered 2011-04-06 – 2011-04-07 (×8): 1 mg via INTRAVENOUS
  Filled 2011-04-06 (×9): qty 1

## 2011-04-06 MED ORDER — SODIUM CHLORIDE 0.9 % IJ SOLN
INTRAMUSCULAR | Status: AC
  Filled 2011-04-06: qty 3

## 2011-04-06 MED ORDER — INSULIN ASPART 100 UNIT/ML ~~LOC~~ SOLN
0.0000 [IU] | Freq: Three times a day (TID) | SUBCUTANEOUS | Status: DC
  Administered 2011-04-06: 3 [IU] via SUBCUTANEOUS
  Administered 2011-04-06: 2 [IU] via SUBCUTANEOUS
  Administered 2011-04-08: 5 [IU] via SUBCUTANEOUS
  Administered 2011-04-08: 9 [IU] via SUBCUTANEOUS
  Administered 2011-04-09: 7 [IU] via SUBCUTANEOUS
  Administered 2011-04-10 – 2011-04-11 (×2): 2 [IU] via SUBCUTANEOUS
  Administered 2011-04-11 – 2011-04-12 (×2): 3 [IU] via SUBCUTANEOUS
  Administered 2011-04-12: 2 [IU] via SUBCUTANEOUS
  Administered 2011-04-12: 9 [IU] via SUBCUTANEOUS
  Administered 2011-04-13: 1 [IU] via SUBCUTANEOUS
  Administered 2011-04-13: 9 [IU] via SUBCUTANEOUS
  Administered 2011-04-13: 7 [IU] via SUBCUTANEOUS
  Administered 2011-04-14: 2 [IU] via SUBCUTANEOUS

## 2011-04-06 MED ORDER — PANTOPRAZOLE SODIUM 40 MG PO TBEC
40.0000 mg | DELAYED_RELEASE_TABLET | Freq: Every day | ORAL | Status: DC
  Administered 2011-04-07 – 2011-04-28 (×22): 40 mg via ORAL
  Filled 2011-04-06 (×23): qty 1

## 2011-04-06 MED ORDER — WARFARIN - PHYSICIAN DOSING INPATIENT: Freq: Every day | Status: DC

## 2011-04-06 MED ORDER — INSULIN ASPART 100 UNIT/ML ~~LOC~~ SOLN: 0.0000 [IU] | Freq: Every day | SUBCUTANEOUS | Status: DC

## 2011-04-06 MED ORDER — INSULIN GLARGINE 100 UNIT/ML ~~LOC~~ SOLN
10.0000 [IU] | Freq: Once | SUBCUTANEOUS | Status: AC
  Administered 2011-04-06: 10 [IU] via SUBCUTANEOUS

## 2011-04-06 NOTE — Progress Notes (Signed)
UR Chart Review Completed  

## 2011-04-06 NOTE — H&P (Signed)
[name], [name]                ACCOUNT NO.:  [id number]  MEDICAL RECORD NO.:  [id number]  LOCATION:  IC02                          FACILITY:  [facility]  PHYSICIAN:  Estill Bamberg. Karie Kirks, M.D.DATE OF BIRTH:  Sep 21, 1970  DATE OF ADMISSION:  04/05/2011 DATE OF DISCHARGE:  LH                             HISTORY & PHYSICAL   CSN NUMBER:  [id number].  This 41 year old presents to the emergency room with persistently high glucoses.  However, she had just been admitted to this hospital for 3-4 days and discharged about 5 days ago.  She has had multiple medical problems related to diabetes.  She developed diabetes about 30 years ago, has been on insulin since then. She has had small vessel disease and just a month ago lost her left 4th and 5th toes secondary to this.  Apparently she had a clot in the distal aorta and required a thrombectomy of this, has been on warfarin.  After this clot lysed, she did have a good flow to the left posterior tibial artery.  Amputations done by Dr. Sharol Given in [place] and Dr. Kellie Simmering has followed her vascular status.  She has had multiple procedures done.  She has had a CT scan of the chest with contrast just 3 weeks ago, which showed diffuse soft tissue edema along the chest and abdominal wall, small volume abdominal ascites, diffuse mesenteric edema, diffuse prominent mesenteric vasculature and mild periportal edema and scattered calcifications along the abdominal aorta and its branches.  She also had a CT of the maxillofacial region back in December.  Just 3 months ago she had mild subcutaneous edema and stranding, suggesting possibly a cellulitis.  In early January, she had angiography of the distal legs, which showed patency of the distal popliteal artery after left popliteal embolectomy.  She really now has no cough, no sputum production, no urinary symptoms but she has had pain in the left leg extending to the left foot on up to the left thigh.  She  knows she has had some swelling of face, but she has had no pain in her face as such.  PHYSICAL EXAM:  She is a pleasant 41 year old woman who was seen in the emergency room.  She is supine on a stretcher.  Temperature is 98.7, pulse 106, respiratory 20, blood pressure 132/70.  She had both upper and lower dentures.  Mucous membranes of the mouth appeared normal.  She did have some swelling and mild erythema of both malar regions and also extending to the upper eyelids but no tenderness on palpation. There were some tender anterior cervical nodes, however, felt bilaterally. Her heart had a regular rhythm.  Rate of about 80 on my exam. Lungs were clear throughout.  She is moving air well. Abdomen was soft. She had a very cool left foot with amputation of the left 4th and 5th toes.  I could not palpate pulses.  The right foot was warmer.  She had what apparently was a blackening of the distal aspect of the left great toe as well.  X-ray of the left tibia-fibula done today was negative for fracture/dislocation.  X-ray of the right humerus showed no acute fracture.  X-ray  of the left foot showed previous amputations of left 4th and 5th toes at the metatarsophalangeal joint level and a possible fracture of the navicular bone versus variant anatomy.  There was erosion associated with the cuboid bone.  The left femur x-ray was likewise negative.  Ultrasound of the left lower extremity done 5 days ago showed no evidence of DVT, but there was fluid collection medial to the left knee joint.  Right upper extremity was also treated with ultrasound/venous Doppler.  There was no DVT there as well.  The EKG showed signs of a septal infarct and possibly a lateral infarct. Of interest, the patient was unaware of having any chest pain, pressure, or prior EKG or prior heart problems.  I did review her cardiac echo done in January, which showed she had a 65% to 70% ejection fraction and mild  LVH.  Admission white cell count is 15,900, which is down from 13,400 yesterday but down from 20,600, five days ago.  She has 70% to 80% neutrophils, hemoglobin 11.4, platelet count 309,000.  BMP shows a sodium 131, bicarb of 18, BUN 20, creatinine 1.23, and a glucose of 692.  ADMISSION DIAGNOSES: 1. Uncontrolled type 1 diabetes. 2. Presumptive infection of the left foot or of the face. 3. Peripheral arterial disease secondary to diabetes. 4. 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 5. Question coronary artery disease. 6. Warfarin anticoagulation. 7. Depression. At present, will put her on insulin for treatment.  Recheck a CT of the foot tomorrow to see if she has osteomyelitis.  She will be on high-dose IV antibiotics given her frequent hospital exposure.  Will continue to treat her pain with oral meds at present.  Continue other home medications.  She will be seen by her MD, Dr. Luan Pulling, tomorrow and will ask Dr. Dorris Fetch, diabetologist, to see her as well and possibly cardiology, in addition.     Estill Bamberg. Karie Kirks, M.D.     SDK/MEDQ  D:  04/05/2011  T:  04/06/2011  Job:  [id number]

## 2011-04-06 NOTE — Consult Note (Signed)
Lisa Savannah, MD Physician Signed Cardiology Consult Note 04/06/2011 5:17 PM  Patient ID: [name] MRN: [id number], DOB/AGE: 06/16/1970   Date of Consult: 04/06/2011  Age: 41 y.o.  Primary Physician: Lisa Bogus, MD, MD Primary Cardiologist: Previously Dr. Haroldine [name]  History of present illness: Consultation requested by Dr. Karie [name] as the result of EKG abnormalities in this woman w/o prior cardiac hx  She was previously evaluated by [name] 2 months ago when she presented to ED thrombus in the infrarenal Ao and occlusions of the AT, PT, and Peroneal arteries.  She was treated with intra-arterial thrombolytics and then underwent surgical exploration and thrombectomy for multiple left leg vessels as well as patch angioplasty of the left popliteal.  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 She has had continued pain in that leg and has been relatively immobile but has otherwise did well following that hospital admission.   A CT scan of the chest showed minor calcifications in the aorta without comment on the coronary arteries. An echocardiogram showed normal to hyperdynamic left ventricular systolic function without other significant cardiac abnormalities.  She has recently presented twice with diabetes out of control resulting in this admission as well as admission a few days ago.  EKG shows evidence for previous anteroseptal and lateral infarction, but is unchanged from 01/2011.  Problem List: Past Medical History   Diagnosis  Date   .  Shingles         11/2010   .  Cellulitis         of face - 12/2010   .  Peripheral vascular disease in diabetes mellitus     .  Hyperlipidemia     .  Tobacco abuse         0.5 - 1ppd x 21 yrs   .  Headache     .  Renal disorder         glomerulonephritis   .  Blood transfusion     .  Anxiety     .  Depression     .  Peripheral vascular disease     .  Diabetes mellitus         IDDM  Since Age 80   .  Embolism - blood clot       Past Surgical History   Procedure  Date   .  Wrist surgery         left   .  Embolectomy  01/29/2011       Procedure: EMBOLECTOMY;  Surgeon: Lisa Misty, MD;  Location: [facility] OR;  Service: Vascular;  Laterality: Left;  Left Popliteal and Tibial Embolectomy with patch angioplasty   .  Dilation and curettage of uterus     .  Multiple tooth extractions     .  Amputation  03/03/2011       Procedure: AMPUTATION DIGIT;  Surgeon: Lisa Minion, MD;  Location: [place];  Service: Orthopedics;  Laterality: Left;  Left Foot Amputation 4th and 5th toes at MTP joint    Allergies:  Allergies   Allergen  Reactions   .  Aspirin  Other (See Comments)       Cannot take this medication due to kidney disease   .  Ibuprofen  Other (See Comments)       Kidney disease    Facility-Administered Medications Ordered in Other Encounters   Medication  Dose  Route  Frequency  Provider  Last Rate  Last Dose   .  ALPRAZolam Duanne Moron) tablet 1 mg   1 mg  Oral  TID PRN   Bellow, MD     1 mg at 04/06/11 0323   .  amLODipine (NORVASC) tablet 2.5 mg   2.5 mg  Oral  Daily   Bellow, MD     2.5 mg at 04/06/11 1039   .  diphenhydrAMINE (BENADRYL) capsule 25 mg   25 mg  Oral  QHS PRN   Bellow, MD         .  furosemide (LASIX) tablet 20 mg   20 mg  Oral  3 times weekly   Bellow, MD         .  HYDROmorphone (DILAUDID) injection 1 mg   1 mg  Intravenous  Q2H PRN  Lisa Bogus, MD     1 mg at 04/06/11 1428   .  insulin aspart (novoLOG) injection 0-5 Units   0-5 Units  Subcutaneous  QHS   Bellow, MD         .  insulin aspart (novoLOG) injection 0-9 Units   0-9 Units  Subcutaneous  TID WC   Bellow, MD     2 Units at 04/06/11 1643   .  insulin glargine (LANTUS) injection 10 Units   10 Units  Subcutaneous  Once   Bellow, MD     10 Units at 04/06/11 0415   .  insulin glargine (LANTUS) injection 10 Units   10 Units   Subcutaneous  QHS   Bellow, MD         .  insulin regular (NOVOLIN R,HUMULIN R) 100 units/mL injection 10 Units   10 Units  Subcutaneous  Once  Lisa Kung, MD         .  mulitivitamin with minerals tablet 1 tablet   1 tablet  Oral  Daily   Bellow, MD     1 tablet at 04/06/11 1038   .  omega-3 acid ethyl esters (LOVAZA) capsule 1 g   1 g  Oral  Daily   Bellow, MD     1 g at 04/06/11 1040   .  ondansetron (ZOFRAN-ODT) disintegrating tablet 8 mg   8 mg  Oral  Q8H PRN   Bellow, MD    .  oxyCODONE-acetaminophen [facility]) 5-325 MG per tablet 2 tablet   2 tablet  Oral  Q6H PRN   Bellow, MD     2 tablet at 04/06/11 1303   .  oxyCODONE-acetaminophen (PERCOCET) 5-325 MG per tablet              1 tablet at 04/05/11 2126   .  pantoprazole (PROTONIX) EC tablet 40 mg   40 mg  Oral  Q1200  Lisa Stain, MD         .  piperacillin-tazobactam (ZOSYN) IVPB 3.375 g   3.375 g  Intravenous  Q8H   Bellow, MD     3.375 g at 04/06/11 1530   .  potassium chloride SA (K-DUR,KLOR-CON) CR tablet 10 mEq   10 mEq  Oral  Daily   Bellow, MD     10 mEq at 04/06/11 1040   .  sertraline (ZOLOFT) tablet 50 mg   50 mg  Oral  Daily   Bellow, MD     50 mg at 04/06/11 1039   .  simvastatin (  ZOCOR) tablet 20 mg   20 mg  Oral  q1800   Bellow, MD         .  vancomycin (VANCOCIN) IVPB 1000 mg/200 mL premix   1,000 mg  Intravenous  Q12H   Bellow, MD     1,000 mg at 04/06/11 1200   .  warfarin (COUMADIN) tablet 3 mg   3 mg  Oral  q1800   Bellow, MD          Family History   Problem  Relation  Age of Onset   .  COPD  Mother         alive - 75   .  Hypertension  Mother     .  Diabetes  Mother     .  Stroke  Mother     .  Coronary artery disease  Mother     .  Diabetes  Father         alive - 17   .  Hypertension  Father     .  Coronary artery disease  Father     .  Anesthesia problems  Neg Hx          Social History   .  Marital Status:  Single       Spouse Name:  N/A       Number of Children:  N/A   .  Years of Education:  N/A    Social History Main Topics   .  Smoking status:  Passive Smoker -- 1.0 packs/day for 21 years       Types:  Cigarettes       Last Attempt to Quit:  01/30/2011   .  Smokeless tobacco:  Never Used   .  Alcohol Use:  No   .  Drug Use:  1 per week       Special:  Marijuana         former   .  Sexually Active:  Not on file         pt stated that her tubal was only 50% effective- "not cut and burned"    Social History Narrative     Unemployed.  Lives in [place] with [place], [place], and mother.  Remains physically active.  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    Review of Systems: General: negative for chills, fever, night sweats or weight changes.   Cardiovascular: negative for chest pain, class II dyspnea on exertion.  No edema, orthopnea, palpitations, paroxysmal nocturnal dyspnea. Dermatological: negative for rash Respiratory: negative for cough or wheezing Urologic: negative for hematuria Abdominal: negative for nausea, vomiting, diarrhea, bright red blood per rectum, melena, or hematemesis Neurologic: negative for visual changes, syncope, or dizziness Extremities: Chronic pain in left leg below the knee All other systems reviewed and are otherwise negative except as noted above.  Physical Exam: Blood pressure 132/74, pulse 80, temperature 98 F (36.7 C), temperature source Oral, resp. rate 18, height 5\' 1"  ([id number] m), weight 61.598 kg (135 lb 12.8 oz), last menstrual period 01/11/2011, SpO2 97.00%.  General: Well developed, well nourished, in no acute distress. Head: Normocephalic, atraumatic, sclera non-icteric, no xanthomas, nares are without discharge.        Neck: Supple without bruits or JVD. Lungs:  Resp regular and unlabored, CTA. Heart: RRR; normal S1 and S2; no s3, s4; modest systolic ejection murmur Abdomen: Soft, non-tender,  non-distended, BS + x 4.   Msk:  Strength  and tone appears normal for age. Extremities: No clubbing, cyanosis or edema. Left foot is warm and well perfused but extremely tender to the touch with dorsalis pedis pulse present.  S/p 4-5th toe amputation  Neuro: Alert and oriented X 3. Moves all extremities spontaneously. Psych: Normal affect.   Results for orders placed during the hospital encounter of 04/05/11 (from the past 48 hour(s))  GLUCOSE, CAPILLARY     Status: Abnormal   Collection Time   04/05/11  2:12 PM      Component Value Range Comment   Glucose-Capillary >600 (*) 70 - 99 (mg/dL)   CBC     Status: Abnormal   Collection Time   04/05/11  3:24 PM      Component Value Range Comment   WBC 15.9 (*) 4.0 - 10.5 (K/uL)    RBC 3.76 (*) 3.87 - 5.11 (MIL/uL)    Hemoglobin 11.4 (*) 12.0 - 15.0 (g/dL)    HCT 34.9 (*) 36.0 - 46.0 (%)    MCV 92.8  78.0 - 100.0 (fL)    MCH 30.3  26.0 - 34.0 (pg)    MCHC 32.7  30.0 - 36.0 (g/dL)    RDW 15.3  11.5 - 15.5 (%)    Platelets 309  150 - 400 (K/uL) DELTA CHECK NOTED  DIFFERENTIAL     Status: Abnormal   Collection Time   04/05/11  3:24 PM      Component Value Range Comment   Neutrophils Relative 78 (*) 43 - 77 (%)    Neutro Abs 12.3 (*) 1.7 - 7.7 (K/uL)    Lymphocytes Relative 15  12 - 46 (%)    Lymphs Abs 2.4  0.7 - 4.0 (K/uL)    Monocytes Relative 7  3 - 12 (%)    Monocytes Absolute 1.2 (*) 0.1 - 1.0 (K/uL)    Eosinophils Relative 0  0 - 5 (%)    Eosinophils Absolute 0.0  0.0 - 0.7 (K/uL)    Basophils Relative 0  0 - 1 (%)    Basophils Absolute 0.0  0.0 - 0.1 (K/uL)   BASIC METABOLIC PANEL     Status: Abnormal   Collection Time   04/05/11  3:24 PM      Component Value Range Comment   Sodium 131 (*) 135 - 145 (mEq/L)    Potassium 4.6  3.5 - 5.1 (mEq/L)    Chloride 97  96 - 112 (mEq/L)    CO2 18 (*) 19 - 32 (mEq/L)    Glucose, Bld 692 (*) 70 - 99 (mg/dL)    BUN 20  6 - 23 (mg/dL)    Creatinine, Ser 1.23 (*) 0.50 - 1.10 (mg/dL)     Calcium 8.2 (*) 8.4 - 10.5 (mg/dL)    GFR calc non Af Amer 54 (*) >90 (mL/min)    GFR calc Af Amer 62 (*) >90 (mL/min)       Component Value Range Comment   Glucose-Capillary 135 (*) 70 - 99 (mg/dL)    Comment 1 Notify RN     PROTIME-INR     Status: Abnormal   Collection Time   04/06/11  2:52 AM      Component Value Range Comment   Prothrombin Time 18.3 (*) 11.6 - 15.2 (seconds)    INR 1.49  0.00 - [id number]    BASIC METABOLIC PANEL     Status: Abnormal   Collection Time   04/06/11  2:52 AM      Component Value Range Comment  Sodium 134 (*) 135 - 145 (mEq/L)    Potassium 3.6  3.5 - 5.1 (mEq/L) DELTA CHECK NOTED   Chloride 103  96 - 112 (mEq/L)    CO2 24  19 - 32 (mEq/L)    Glucose, Bld 164 (*) 70 - 99 (mg/dL)    BUN 17  6 - 23 (mg/dL)    Creatinine, Ser 1.12 (*) 0.50 - 1.10 (mg/dL)    Calcium 7.5 (*) 8.4 - 10.5 (mg/dL)    GFR calc non Af Amer 60 (*) >90 (mL/min)    GFR calc Af Amer 70 (*) >90 (mL/min)    ECG:  Normal sinus rhythm; prior lateral infarction; probable prior anteroseptal infarction. No change when compared to previous tracing performed 02/05/2011  ASSESSMENT AND PLAN:   1. Abnormal EKG: Patient has had no sizable myocardial infarction based upon normal regional and global LV systolic function by echocardiography 2 months ago at which time EKG was identical to the one obtained during this admission.   Conduction system disease could account for the abnormalities in the lateral leads, and lead placement issues for the poor R wave progression seen in the leads V1-V3. There is a possibility of myocardial scarring related to nonischemic processes or to nontransmural infarction.  I. will review the previously obtained CT scan to determine if coronary calcification was present. If not, the likelihood of significant coronary disease is extremely low. A pharmacologic stress nuclear study can be obtained following discharge to exclude significant myocardial injury or ischemia.  2.  Arterial thromboembolism: Etiology of thromboembolic occlusion of the left lower extremity arteries was not determined during admission in January.  There was no apparent cardiac source on echocardiography. Silent paroxysmal atrial fibrillation is a frequent cause. There is no evidence for patent foramen ovale or venous thromboembolism causing paradoxic embolization. There is some atherosclerotic disease of the thoracic and abdominal aorta, but this is modest. The advisability of additional testing to identify a source will be reconsidered.  3. Diabetes:  Improved with initial treatment.   4.Tobacco Abuse:   The critical importance of complete cessation of tobacco use was discussed with the patient.  Signed, [name], NP 04/06/2011, 5:18 PM

## 2011-04-06 NOTE — Progress Notes (Signed)
Subjective: She is admitted with poor pain control elevated blood sugar and potential osteomyelitis in her foot. She had thrombosis in her aorta which was cleared but she developed "trash foot" which required amputation of 2 toes and may eventually require amputation of the great toe. Her x-ray shows what may be some inflammation/fracture in the affected foot but there is a concern about osteomyelitis. Her blood sugar has been poorly controlled. She says her pain is not controlled.  Objective: Vital signs in last 24 hours: Temp:  [98.6 F (37 C)-98.8 F (37.1 C)] 98.7 F (37.1 C) (03/19 0400) Pulse Rate:  [82-106] 91  (03/19 0600) Resp:  [0-20] 12  (03/19 0600) BP: (105-150)/(62-90) 118/69 mmHg (03/19 0600) SpO2:  [94 %-100 %] 96 % (03/19 0600) Weight:  [63.504 kg (140 lb)-67.8 kg (149 lb 7.6 oz)] 67.8 kg (149 lb 7.6 oz) (03/19 0500) Weight change:  Last BM Date: 04/05/11  Intake/Output from previous day: 03/18 0701 - 03/19 0700 In: 1211.3 [I.V.:961.3; IV Piggyback:250] Out: -   PHYSICAL EXAM General appearance: alert, cooperative and mild distress Resp: clear to auscultation bilaterally Cardio: regular rate and rhythm, S1, S2 normal, no murmur, click, rub or gallop GI: soft, non-tender; bowel sounds normal; no masses,  no organomegaly Extremities: Amputation of toes and she has a slight gangrene of the distal great toe.  Lab Results:    Basic Metabolic Panel:  Basename 04/06/11 0252 04/05/11 1524  NA 134* 131*  K 3.6 4.6  CL 103 97  CO2 24 18*  GLUCOSE 164* 692*  BUN 17 20  CREATININE 1.12* 1.23*  CALCIUM 7.5* 8.2*  MG -- --  PHOS -- --   Liver Function Tests: No results found for this basename: AST:2,ALT:2,ALKPHOS:2,BILITOT:2,PROT:2,ALBUMIN:2 in the last 72 hours No results found for this basename: LIPASE:2,AMYLASE:2 in the last 72 hours No results found for this basename: AMMONIA:2 in the last 72 hours CBC:  Basename 04/05/11 1524 04/04/11 1230  WBC 15.9*  13.4*  NEUTROABS 12.3* 10.6*  HGB 11.4* 11.0*  HCT 34.9* 32.6*  MCV 92.8 90.8  PLT 309 150   Cardiac Enzymes: No results found for this basename: CKTOTAL:3,CKMB:3,CKMBINDEX:3,TROPONINI:3 in the last 72 hours BNP: No results found for this basename: PROBNP:3 in the last 72 hours D-Dimer: No results found for this basename: DDIMER:2 in the last 72 hours CBG:  Basename 04/06/11 0627 04/06/11 0524 04/06/11 0411 04/06/11 0316 04/06/11 0211 04/06/11 0115  GLUCAP 161* 165* 153* 162* 135* 125*   Hemoglobin A1C: No results found for this basename: HGBA1C in the last 72 hours Fasting Lipid Panel: No results found for this basename: CHOL,HDL,LDLCALC,TRIG,CHOLHDL,LDLDIRECT in the last 72 hours Thyroid Function Tests: No results found for this basename: TSH,T4TOTAL,FREET4,T3FREE,THYROIDAB in the last 72 hours Anemia Panel: No results found for this basename: VITAMINB12,FOLATE,FERRITIN,TIBC,IRON,RETICCTPCT in the last 72 hours Coagulation:  Basename 04/06/11 0252 04/05/11 2239  LABPROT 18.3* 20.2*  INR 1.49 1.69*   Urine Drug Screen: Drugs of Abuse     Component Value Date/Time   LABOPIA NONE DETECTED 03/21/2010 2130   COCAINSCRNUR POSITIVE* 03/21/2010 2130   LABBENZ NONE DETECTED 03/21/2010 2130   AMPHETMU NONE DETECTED 03/21/2010 2130   THCU NONE DETECTED 03/21/2010 2130   LABBARB  Value: NONE DETECTED        DRUG SCREEN FOR MEDICAL PURPOSES ONLY.  IF CONFIRMATION IS NEEDED FOR ANY PURPOSE, NOTIFY LAB WITHIN 5 DAYS.        LOWEST DETECTABLE LIMITS FOR URINE DRUG SCREEN Drug Class  Cutoff (ng/mL) Amphetamine      1000 Barbiturate      200 Benzodiazepine   [id number] Tricyclics       [id number] Opiates          300 Cocaine          300 THC              50 03/21/2010 2130    Alcohol Level: No results found for this basename: ETH:2 in the last 72 hours Urinalysis: No results found for this basename:  COLORURINE:2,APPERANCEUR:2,LABSPEC:2,PHURINE:2,GLUCOSEU:2,HGBUR:2,BILIRUBINUR:2,KETONESUR:2,PROTEINUR:2,UROBILINOGEN:2,NITRITE:2,LEUKOCYTESUR:2 in the last 72 hours Misc. Labs:  ABGS No results found for this basename: PHART,PCO2,PO2ART,TCO2,HCO3 in the last 72 hours CULTURES Recent Results (from the past 240 hour(s))  MRSA PCR SCREENING     Status: Normal   Collection Time   03/28/11 12:00 AM      Component Value Range Status Comment   MRSA by PCR NEGATIVE  NEGATIVE  Final   URINE CULTURE     Status: Normal   Collection Time   03/29/11  7:30 PM      Component Value Range Status Comment   Specimen Description URINE, CLEAN CATCH   Final    Special Requests NONE   Final    Culture  Setup Time [id number]   Final    Colony Count NO GROWTH   Final    Culture NO GROWTH   Final    Report Status 03/30/2011 FINAL   Final   CULTURE, BLOOD (ROUTINE X 2)     Status: Normal   Collection Time   03/31/11 10:55 AM      Component Value Range Status Comment   Specimen Description BLOOD LEFT HAND   Final    Special Requests BOTTLES DRAWN AEROBIC ONLY 4CC   Final    Culture NO GROWTH 5 DAYS   Final    Report Status 04/05/2011 FINAL   Final   MRSA PCR SCREENING     Status: Normal   Collection Time   04/05/11 10:42 PM      Component Value Range Status Comment   MRSA by PCR NEGATIVE  NEGATIVE  Final    Studies/Results: Dg Femur Left  04/05/2011  *RADIOLOGY REPORT*  Clinical Data: Status post fall.  Pain.  LEFT FEMUR - 2 VIEW  Comparison: None.  Findings: No acute bony or joint abnormality is identified.  The hip and knee are located.  Soft tissues are unremarkable.  IMPRESSION: Negative exam.  Original Report Authenticated By: Arvid Right. D'ALESSIO, M.D.   Dg Tibia/fibula Left  04/05/2011  *RADIOLOGY REPORT*  Clinical Data: Recent fall.  LEFT TIBIA AND FIBULA - 2 VIEW  Comparison: None.  Findings: There are no fractures or dislocations.  The bones appear intrinsically normal as do the soft tissues.   IMPRESSION: Negative study.  Original Report Authenticated By: Altamese Cabal, M.D.   Dg Humerus Right  04/05/2011  *RADIOLOGY REPORT*  Clinical Data: Recent fall, hyperglycemia  RIGHT HUMERUS - 2+ VIEW  Comparison: None.  Findings: No acute fracture is seen.  The right shoulder is unremarkable other than a somewhat downward sloping acromion.  IMPRESSION: No acute fracture.  Original Report Authenticated By: Joretta Bachelor, M.D.   Dg Foot Complete Left  04/05/2011  *RADIOLOGY REPORT*  Clinical Data: History of blood clot left leg.  Fall.  LEFT FOOT - COMPLETE 3+ VIEW  Comparison: None.  Findings: There is been previous amputation of the left fourth and fifth toes at the metatarsal phalangeal joint level. There  is an erosion associated with the dorsal portion of the cuboid bone seen on the lateral view. There is a possible subtle fracture of the medial aspect of the navicular bone versus variant anatomy. Recommend correlation with physical examination findings and possibly CT.  IMPRESSION: Previous amputations of the left fourth and fifth toes at the metatarsophalangeal joint levels. Possible fracture of the navicular bone versus variant anatomy.  Suggest correlation with physical examination findings and possibly CT if felt to be indicated. Erosion associated with the cuboid bone as discussed above.  Original Report Authenticated By: Altamese Cabal, M.D.    Medications:  Prior to Admission:  Prescriptions prior to admission  Medication Sig Dispense Refill  . acetaminophen (TYLENOL) 500 MG tablet Take 1,000 mg by mouth every 6 (six) hours as needed. For pain       . ALPRAZolam (XANAX) 1 MG tablet Take 1 mg by mouth daily as needed. For anxiety      . amLODipine (NORVASC) 2.5 MG tablet Take 1 tablet (2.5 mg total) by mouth daily.  30 tablet  5  . cephALEXin (KEFLEX) 500 MG capsule Take 500 mg by mouth 3 (three) times daily.      . diphenhydrAMINE (BENADRYL) 25 MG tablet Take 25 mg by mouth at  bedtime as needed. For sleep      . fish oil-omega-3 fatty acids 1000 MG capsule Take 1 g by mouth daily.      . furosemide (LASIX) 20 MG tablet Take 20 mg by mouth 3 (three) times a week. On Mondays, Wednesdays, and Fridays      . insulin aspart (NOVOLOG) 100 UNIT/ML injection Inject 5 Units into the skin 3 (three) times daily before meals.  1 vial  12  . insulin glargine (LANTUS) 100 UNIT/ML injection Inject 15 Units into the skin at bedtime.  10 mL  12  . Multiple Vitamins-Minerals (MULTIVITAMINS THER. W/MINERALS) TABS Take 1 tablet by mouth daily.        . ondansetron (ZOFRAN-ODT) 8 MG disintegrating tablet Take 8 mg by mouth every 12 (twelve) hours as needed. Nausea      . oxyCODONE-acetaminophen (PERCOCET) 10-325 MG per tablet Take 1 tablet by mouth every 6 (six) hours as needed for pain.  30 tablet  0  . potassium chloride SA (K-DUR,KLOR-CON) 20 MEQ tablet Take 0.5 tablets (10 mEq total) by mouth daily.  7 tablet  0  . pravastatin (PRAVACHOL) 40 MG tablet Take 1 tablet (40 mg total) by mouth daily.  30 tablet  5  . sertraline (ZOLOFT) 50 MG tablet Take 50 mg by mouth daily.       Marland Kitchen warfarin (COUMADIN) 3 MG tablet Take 3 mg by mouth daily.       Scheduled:   . amLODipine  2.5 mg Oral Daily  . furosemide  20 mg Oral 3 times weekly  . HYDROmorphone  1 mg Intravenous Once  . insulin aspart  0-5 Units Subcutaneous QHS  . insulin aspart  0-9 Units Subcutaneous TID WC  . insulin aspart  10 Units Subcutaneous Once  . insulin glargine  10 Units Subcutaneous Once  . insulin glargine  10 Units Subcutaneous QHS  . insulin regular  10 Units Subcutaneous Once  . insulin regular  0-10 Units Intravenous TID WC  . insulin regular  0-10 Units Intravenous TID WC  . mulitivitamin with minerals  1 tablet Oral Daily  . omega-3 acid ethyl esters  1 g Oral Daily  . ondansetron  4 mg  Intravenous Once  . oxyCODONE-acetaminophen      . piperacillin-tazobactam (ZOSYN)  IV  3.375 g Intravenous Q8H  .  potassium chloride SA  10 mEq Oral Daily  . sertraline  50 mg Oral Daily  . simvastatin  20 mg Oral q1800  . sodium chloride  1,000 mL Intravenous Once  . vancomycin  1,000 mg Intravenous Q12H  . warfarin  3 mg Oral q1800  . Warfarin - Physician Dosing Inpatient   Does not apply q1800  . DISCONTD: insulin aspart  10 Units Subcutaneous Once  . DISCONTD: insulin aspart  5 Units Subcutaneous TID AC  . DISCONTD: insulin glargine  15 Units Subcutaneous QHS   Continuous:   . sodium chloride    . sodium chloride 125 mL/hr at 04/05/11 2021  . sodium chloride 100 mL/hr at 04/06/11 0000  . dextrose 5 % and 0.45% NaCl 75 mL/hr at 04/06/11 0600  . insulin (NOVOLIN-R) infusion Stopped (04/06/11 0020)  . DISCONTD: insulin (NOVOLIN-R) infusion     [id number], ALPRAZolam, dextrose, dextrose, diphenhydrAMINE, ondansetron, oxyCODONE-acetaminophen, DISCONTD: oxyCODONE-acetaminophen  Assesment: She may have osteomyelitis and is on the further investigated. She has had amputation of toes of her foot. She's diabetic. She had nephrotic level proteinuria earlier. Active Problems:  * No active hospital problems. *     Plan: For CT of the foot continue with  IV antibiotics she's been treated for elevated blood sugar    LOS: 1 day   Tyaire Odem L 04/06/2011, 7:45 AM

## 2011-04-06 NOTE — Consult Note (Signed)
CARDIOLOGY CONSULT NOTE  Patient ID: [name] MRN: [id number], DOB/AGE: 1970-08-22   Date of Consult: 04/06/2011  Age: 41 y.o.   Primary Physician: Alonza Bogus, MD, MD Primary Cardiologist: Previously Dr. Haroldine Laws   History of present illness: Consultation requested by Dr. Karie Kirks as the result of EKG abnormalities in this woman w/o prior cardiac hx  She was previously evaluated by [facility] 2 months ago when she presented to ED thrombus in the infrarenal Ao and occlusions of the AT, PT, and Peroneal arteries.  She was treated with intra-arterial thrombolytics and then underwent surgical exploration and thrombectomy for multiple left leg vessels as well as patch angioplasty of the left popliteal.  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 She has had continued pain in that leg and has been relatively immobile but has otherwise did well following that hospital admission.   A CT scan of the chest showed minor calcifications in the aorta without comment on the coronary arteries. An echocardiogram showed normal to hyperdynamic left ventricular systolic function without other significant cardiac abnormalities.  She has recently presented twice with diabetes out of control resulting in this admission as well as admission a few days ago.  EKG shows evidence for previous anteroseptal and lateral infarction, but is unchanged from 01/2011.  Problem List: Past Medical History  Diagnosis Date  . Shingles     11/2010  . Cellulitis     of face - 12/2010  . Peripheral vascular disease in diabetes mellitus   . Hyperlipidemia   . Tobacco abuse     0.5 - 1ppd x 21 yrs  . Headache   . Renal disorder     glomerulonephritis  . Blood transfusion   . Anxiety   . Depression   . Peripheral vascular disease   . Diabetes mellitus     IDDM Since Age 30  . Embolism - blood clot     Past Surgical History  Procedure Date  . Wrist surgery    left  . Embolectomy 01/29/2011    Procedure: EMBOLECTOMY;  Surgeon: Mal Misty, MD;  Location[facility] OR;  Service: Vascular;  Laterality: Left;  Left Popliteal and Tibial Embolectomy with patch angioplasty  . Dilation and curettage of uterus   . Multiple tooth extractions   . Amputation 03/03/2011    Procedure: AMPUTATION DIGIT;  Surgeon: Newt Minion, MD;  Location: [place];  Service: Orthopedics;  Laterality: Left;  Left Foot Amputation 4th and 5th toes at MTP joint     Allergies:  Allergies  Allergen Reactions  . Aspirin Other (See Comments)    Cannot take this medication due to kidney disease  . Ibuprofen Other (See Comments)    Kidney disease    Facility-Administered Medications Ordered in Other Encounters  Medication Dose Route Frequency Provider Last Rate Last Dose  . ALPRAZolam Duanne Moron) tablet 1 mg  1 mg Oral TID PRN  Bellow, MD   1 mg at 04/06/11 0323  . amLODipine (NORVASC) tablet 2.5 mg  2.5 mg Oral Daily  Bellow, MD   2.5 mg at 04/06/11 1039  . diphenhydrAMINE (BENADRYL) capsule 25 mg  25 mg Oral QHS PRN  Bellow, MD      . furosemide (LASIX) tablet 20 mg  20 mg Oral 3 times weekly  Bellow, MD      . HYDROmorphone (DILAUDID) injection 1 mg  1 mg Intravenous Q2H PRN Alonza Bogus,  MD   1 mg at 04/06/11 1428  . insulin aspart (novoLOG) injection 0-5 Units  0-5 Units Subcutaneous QHS  Bellow, MD      . insulin aspart (novoLOG) injection 0-9 Units  0-9 Units Subcutaneous TID WC  Bellow, MD   2 Units at 04/06/11 1643  . insulin glargine (LANTUS) injection 10 Units  10 Units Subcutaneous Once  Bellow, MD   10 Units at 04/06/11 0415  . insulin glargine (LANTUS) injection 10 Units  10 Units Subcutaneous QHS  Bellow, MD      . insulin regular (NOVOLIN R,HUMULIN R) 100 units/mL injection 10 Units  10 Units Subcutaneous Once Mervin Kung, MD      . mulitivitamin with minerals tablet 1 tablet  1  tablet Oral Daily  Bellow, MD   1 tablet at 04/06/11 1038  . omega-3 acid ethyl esters (LOVAZA) capsule 1 g  1 g Oral Daily  Bellow, MD   1 g at 04/06/11 1040  . ondansetron (ZOFRAN-ODT) disintegrating tablet 8 mg  8 mg Oral Q8H PRN  Bellow, MD      . oxyCODONE-acetaminophen [facility]) 5-325 MG per tablet 2 tablet  2 tablet Oral Q6H PRN  Bellow, MD   2 tablet at 04/06/11 1303  . oxyCODONE-acetaminophen (PERCOCET) 5-325 MG per tablet        1 tablet at 04/05/11 2126  . pantoprazole (PROTONIX) EC tablet 40 mg  40 mg Oral Q1200 Elsie Stain, MD      . piperacillin-tazobactam (ZOSYN) IVPB 3.375 g  3.375 g Intravenous Q8H  Bellow, MD   3.375 g at 04/06/11 1530  . potassium chloride SA (K-DUR,KLOR-CON) CR tablet 10 mEq  10 mEq Oral Daily  Bellow, MD   10 mEq at 04/06/11 1040  . sertraline (ZOLOFT) tablet 50 mg  50 mg Oral Daily  Bellow, MD   50 mg at 04/06/11 1039  . simvastatin (ZOCOR) tablet 20 mg  20 mg Oral q1800  Bellow, MD      . vancomycin (VANCOCIN) IVPB 1000 mg/200 mL premix  1,000 mg Intravenous Q12H  Bellow, MD   1,000 mg at 04/06/11 1200  . warfarin (COUMADIN) tablet 3 mg  3 mg Oral q1800  Bellow, MD       Family History  Problem Relation Age of Onset  . COPD Mother     alive - 4  . Hypertension Mother   . Diabetes Mother   . Stroke Mother   . Coronary artery disease Mother   . Diabetes Father     alive - 53  . Hypertension Father   . Coronary artery disease Father   . Anesthesia problems Neg Hx      History   Social History  . Marital Status: Single    Spouse Name: N/A    Number of Children: N/A  . Years of Education: N/A   Social History Main Topics  . Smoking status: Passive Smoker -- 1.0 packs/day for 21 years    Types: Cigarettes    Last Attempt to Quit: 01/30/2011  . Smokeless tobacco: Never Used  . Alcohol Use: No  . Drug Use: 1 per week     Special: Marijuana     former  . Sexually Active: Not on file     pt stated that her tubal was only 50% effective- "not cut and burned"   Social History Narrative   Unemployed.  Lives in [place] with [place], [place], and mother.  Remains physically active.  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     Review of Systems: General: negative for chills, fever, night sweats or weight changes.  Cardiovascular: negative for chest pain, class II dyspnea on exertion.  No edema, orthopnea, palpitations, paroxysmal nocturnal dyspnea. Dermatological: negative for rash Respiratory: negative for cough or wheezing Urologic: negative for hematuria Abdominal: negative for nausea, vomiting, diarrhea, bright red blood per rectum, melena, or hematemesis Neurologic: negative for visual changes, syncope, or dizziness Extremities: Chronic pain in left leg below the knee All other systems reviewed and are otherwise negative except as noted above.  Physical Exam: Blood pressure 132/74, pulse 80, temperature 98 F (36.7 C), temperature source Oral, resp. rate 18, height 5\' 1"  (1.549 m), weight 61.598 kg (135 lb 12.8 oz), last menstrual period 01/11/2011, SpO2 97.00%.  General: Well developed, well nourished, in no acute distress. Head: Normocephalic, atraumatic, sclera non-icteric, no xanthomas, nares are without discharge.  Neck: Supple without bruits or JVD. Lungs:  Resp regular and unlabored, CTA. Heart: RRR no s3, s4, or murmurs. Abdomen: Soft, non-tender, non-distended, BS + x 4.  Msk:  Strength and tone appears normal for age. Extremities: No clubbing, cyanosis or edema. Left foot is warm and well perfused but extremely tender to the touch with dorsalis pedis pulse present.  S/p 4-5th toe amputation Neuro: Alert and oriented X 3. Moves all extremities spontaneously. Psych: Normal affect.  Radiology/Studies: Dg Chest Portable 1 View  01/27/2011  *RADIOLOGY REPORT*  Clinical Data: Left lower  extremity pain, no pulses, hypertension  PORTABLE CHEST - 1 VIEW  Comparison: Portable exam 1029 hours compared to 03/21/2010  Findings: Normal heart size, mediastinal contours, and pulmonary vascularity. Mild peribronchial thickening. No pulmonary infiltrate, pleural effusion, or pneumothorax. Bones unremarkable.  IMPRESSION: Minimal chronic bronchitic changes.  Original Report Authenticated By: Burnetta Sabin, M.D.   EKG:  Normal sinus rhythm; prior lateral infarction; probable prior anteroseptal infarction.  No change when compared to previous tracing performed 02/05/2011  ASSESSMENT AND PLAN:   1. Abnormal EKG: Patient has had no sizable myocardial infarction based upon normal regional and global LV systolic function by echocardiography 2 months ago at which time EKG was identical to the one obtained during this admission.   Conduction system disease could account for the abnormalities in the lateral leads, and lead placement issues for the poor R wave progression seen in the leads V1-V3. There is a possibility of myocardial scarring related to nonischemic processes or to nontransmural infarction.  I. will review the previously obtained CT scan to determine if coronary calcification was present. If not, the likelihood of significant coronary disease is extremely low. A pharmacologic stress nuclear study can be obtained following discharge to exclude significant myocardial injury or ischemia. 2. Arterial thromboembolism: Etiology of thromboembolic occlusion of the left lower extremity arteries was not determined during admission in January.  There was no apparent cardiac source on echocardiography. Silent paroxysmal atrial fibrillation is a frequent cause. There is no evidence for patent foramen ovale or venous thromboembolism causing paradoxic embolization. There is some atherosclerotic disease of the thoracic and abdominal aorta, but this is modest. The advisability of additional testing to identify a  source will be reconsidered. 3. Diabetes:  Improved with initial treatment.    4.Tobacco Abuse:   The critical importance of complete cessation of tobacco use was discussed with the patient.   Signed, [name], NP 04/06/2011, 5:18 PM  Patient seen and examined with Ignacia Bayley, NP. We discussed all aspects of the encounter. I agree with the assessment and plan as stated above.    Very difficult case. Given her risk factors and ECG she almost certainly has underlying CAD. However functional capacity is not too bad. She appears to be at high risk of losing her leg if she does not have surgery soon. Overall I think she is at moderate to high (but not prohibitive) risk for serious peri-op cardiac complications. At this point would recommend 2-D echo (to look at [facility] and possible cardiac source of embolus). If echo shows significant LV dysfunction would recommend cath tomorrow to exclude high-grade proximal CAD. If EF is normal, I think we should proceed with surgery given high risk of limb loss with knowledge that surgery will be of considerable cardiac risk to her.  Agree with heparin and b-block (as BP tolerates). Has real ASA allergy so cannot use that. No Plavix due to need for surgery.   Discussed at length with patient. Will follow with you.  Leean Amezcua,MD 5:18 PM

## 2011-04-06 NOTE — Progress Notes (Addendum)
Pt within target range with anion gap of 7; notified Dr. Karie Kirks and received orders to give 10 units of lantus and to use sensitive sliding scale insulin when d/c insulin gtt; also given orders to tell primary provider, Luan Pulling, to change insulin orders tomorrow if needed; will continue to monitor pt

## 2011-04-06 NOTE — Progress Notes (Signed)
Four consecutive blood sugars within target range but anion gap is 16; pt had no scheduled labs except INR, notified Dr. Karie Kirks and put in order for a one time BMP

## 2011-04-06 NOTE — Progress Notes (Signed)
[facility] Progress Note Patient Name: [name] DOB: December 26, 1970 MRN: [id number]  Date of Service  04/06/2011   HPI/Events of Note  PPI for SUP proph ordered  eICU Interventions  See orders      Asencion Noble 04/06/2011, 4:34 PM

## 2011-04-07 DIAGNOSIS — I798 Other disorders of arteries, arterioles and capillaries in diseases classified elsewhere: Secondary | ICD-10-CM

## 2011-04-07 DIAGNOSIS — E119 Type 2 diabetes mellitus without complications: Secondary | ICD-10-CM

## 2011-04-07 DIAGNOSIS — E785 Hyperlipidemia, unspecified: Secondary | ICD-10-CM

## 2011-04-07 DIAGNOSIS — R9431 Abnormal electrocardiogram [ECG] [EKG]: Secondary | ICD-10-CM

## 2011-04-07 DIAGNOSIS — F17200 Nicotine dependence, unspecified, uncomplicated: Secondary | ICD-10-CM

## 2011-04-07 DIAGNOSIS — L02619 Cutaneous abscess of unspecified foot: Secondary | ICD-10-CM

## 2011-04-07 DIAGNOSIS — E1159 Type 2 diabetes mellitus with other circulatory complications: Secondary | ICD-10-CM

## 2011-04-07 DIAGNOSIS — I743 Embolism and thrombosis of arteries of the lower extremities: Secondary | ICD-10-CM

## 2011-04-07 LAB — GLUCOSE, CAPILLARY
Glucose-Capillary: 39 mg/dL — CL (ref 70–99)
Glucose-Capillary: 41 mg/dL — CL (ref 70–99)
Glucose-Capillary: 61 mg/dL — ABNORMAL LOW (ref 70–99)
Glucose-Capillary: 135 mg/dL — ABNORMAL HIGH (ref 70–99)
Glucose-Capillary: 108 mg/dL — ABNORMAL HIGH (ref 70–99)
Glucose-Capillary: 120 mg/dL — ABNORMAL HIGH (ref 70–99)
Glucose-Capillary: 159 mg/dL — ABNORMAL HIGH (ref 70–99)

## 2011-04-07 LAB — PROTIME-INR
INR: 1.18 (ref 0.00–1.49)
Prothrombin Time: 15.2 seconds (ref 11.6–15.2)

## 2011-04-07 LAB — VANCOMYCIN, TROUGH: Vancomycin Tr: 24.3 ug/mL — ABNORMAL HIGH (ref 10.0–20.0)

## 2011-04-07 MED ORDER — SODIUM CHLORIDE 0.9 % IV SOLN
INTRAVENOUS | Status: DC
  Administered 2011-04-07 – 2011-04-15 (×10): via INTRAVENOUS

## 2011-04-07 MED ORDER — VANCOMYCIN HCL 1000 MG IV SOLR
750.0000 mg | Freq: Two times a day (BID) | INTRAVENOUS | Status: DC
  Administered 2011-04-07 – 2011-04-08 (×4): 750 mg via INTRAVENOUS
  Filled 2011-04-07 (×11): qty 750

## 2011-04-07 MED ORDER — HYDROMORPHONE HCL PF 1 MG/ML IJ SOLN
1.0000 mg | INTRAMUSCULAR | Status: DC | PRN
  Administered 2011-04-07 – 2011-04-12 (×41): 1 mg via INTRAVENOUS
  Filled 2011-04-07 (×46): qty 1

## 2011-04-07 MED ORDER — DEXTROSE 50 % IV SOLN
INTRAVENOUS | Status: AC
  Administered 2011-04-07: 08:00:00
  Filled 2011-04-07: qty 50

## 2011-04-07 NOTE — Progress Notes (Signed)
   CARE MANAGEMENT NOTE 04/07/2011  Patient:  [name], [name]   Account Number:  [id number]  Date Initiated:  04/07/2011  Documentation initiated by:  Theophilus Kinds  Subjective/Objective Assessment:   Pt admitted from home with recurrent cellulitis. 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 Possible osteomylitis. Pt lives with mother and fiance. Pt is able to afford meds. Active with [facility].     Action/Plan:   CM spoke with pt about possible need for IV AB at discharge. Pain control is also an issue at this point.   Anticipated DC Date:  04/14/2011   Anticipated DC Plan:  [place]  CM consult      Choice offered to / List presented to:             [place].   Status of service:  In process, will continue to follow Medicare Important Message given?   (If response is "NO", the following Medicare IM given date fields will be blank) Date Medicare IM given:   Date Additional Medicare IM given:    Discharge Disposition:  [place]  Per UR Regulation:    If discussed at Long Length of Stay Meetings, dates discussed:    Comments:  04/07/11 [place], RN BSN CM Pt admitted from home with cellulitis and possible osteomylitis. Pt already active with [facility]. Will resume services at discharge.

## 2011-04-07 NOTE — Progress Notes (Signed)
Subjective: She says she's having more pain in her foot. She has gone about 4 hours without pain medication. She had a MRI made of her foot last night but I don't have results yet. I'm concerned that she may have osteomyelitis.  Objective: Vital signs in last 24 hours: Temp:  [98 F (36.7 C)-98.8 F (37.1 C)] 98.2 F (36.8 C) (03/20 0400) Pulse Rate:  [83-120] 120  (03/20 0600) Resp:  [10-21] 13  (03/20 0600) BP: (96-162)/(66-144) 123/69 mmHg (03/20 0500) SpO2:  [95 %-100 %] 100 % (03/20 0600) Weight change:  Last BM Date: 04/06/11  Intake/Output from previous day: 03/19 0701 - 03/20 0700 In: 3643.8 [P.O.:1440; I.V.:1653.8; IV Piggyback:550] Out: 2000 [Urine:2000]  PHYSICAL EXAM General appearance: alert and severe distress Resp: clear to auscultation bilaterally Cardio: regular rate and rhythm, S1, S2 normal, no murmur, click, rub or gallop GI: soft, non-tender; bowel sounds normal; no masses,  no organomegaly Extremities: 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 on the great toe  Lab Results:    Basic Metabolic Panel:  Basename 04/06/11 0252 04/05/11 1524  NA 134* 131*  K 3.6 4.6  CL 103 97  CO2 24 18*  GLUCOSE 164* 692*  BUN 17 20  CREATININE 1.12* 1.23*  CALCIUM 7.5* 8.2*  MG -- --  PHOS -- --   Liver Function Tests: No results found for this basename: AST:2,ALT:2,ALKPHOS:2,BILITOT:2,PROT:2,ALBUMIN:2 in the last 72 hours No results found for this basename: LIPASE:2,AMYLASE:2 in the last 72 hours No results found for this basename: AMMONIA:2 in the last 72 hours CBC:  Basename 04/05/11 1524 04/04/11 1230  WBC 15.9* 13.4*  NEUTROABS 12.3* 10.6*  HGB 11.4* 11.0*  HCT 34.9* 32.6*  MCV 92.8 90.8  PLT 309 150   Cardiac Enzymes: No results found for this basename: CKTOTAL:3,CKMB:3,CKMBINDEX:3,TROPONINI:3 in the last 72 hours BNP: No results found for this basename: PROBNP:3 in the last 72 hours D-Dimer: No results found for this basename:  DDIMER:2 in the last 72 hours CBG:  Basename 04/06/11 2102 04/06/11 1609 04/06/11 1136 04/06/11 0758 04/06/11 0627 04/06/11 0524  GLUCAP 169* 173* 63* 150* 161* 165*   Hemoglobin A1C: No results found for this basename: HGBA1C in the last 72 hours Fasting Lipid Panel: No results found for this basename: CHOL,HDL,LDLCALC,TRIG,CHOLHDL,LDLDIRECT in the last 72 hours Thyroid Function Tests: No results found for this basename: TSH,T4TOTAL,FREET4,T3FREE,THYROIDAB in the last 72 hours Anemia Panel: No results found for this basename: VITAMINB12,FOLATE,FERRITIN,TIBC,IRON,RETICCTPCT in the last 72 hours Coagulation:  Basename 04/07/11 0519 04/06/11 0252  LABPROT 15.2 18.3*  INR 1.18 1.49   Urine Drug Screen: Drugs of Abuse     Component Value Date/Time   LABOPIA NONE DETECTED 03/21/2010 2130   COCAINSCRNUR POSITIVE* 03/21/2010 2130   LABBENZ NONE DETECTED 03/21/2010 2130   AMPHETMU NONE DETECTED 03/21/2010 2130   THCU NONE DETECTED 03/21/2010 2130   LABBARB  Value: NONE DETECTED        DRUG SCREEN FOR MEDICAL PURPOSES ONLY.  IF CONFIRMATION IS NEEDED FOR ANY PURPOSE, NOTIFY LAB WITHIN 5 DAYS.        LOWEST DETECTABLE LIMITS FOR URINE DRUG SCREEN Drug Class       Cutoff (ng/mL) Amphetamine      1000 Barbiturate      200 Benzodiazepine   [id number] Tricyclics       [id number] Opiates          300 Cocaine          300 THC  50 03/21/2010 2130    Alcohol Level: No results found for this basename: ETH:2 in the last 72 hours Urinalysis: No results found for this basename: COLORURINE:2,APPERANCEUR:2,LABSPEC:2,PHURINE:2,GLUCOSEU:2,HGBUR:2,BILIRUBINUR:2,KETONESUR:2,PROTEINUR:2,UROBILINOGEN:2,NITRITE:2,LEUKOCYTESUR:2 in the last 72 hours Misc. Labs:  ABGS No results found for this basename: PHART,PCO2,PO2ART,TCO2,HCO3 in the last 72 hours CULTURES Recent Results (from the past 240 hour(s))  URINE CULTURE     Status: Normal   Collection Time   03/29/11  7:30 PM      Component Value Range Status Comment    Specimen Description URINE, CLEAN CATCH   Final    Special Requests NONE   Final    Culture  Setup Time [id number]   Final    Colony Count NO GROWTH   Final    Culture NO GROWTH   Final    Report Status 03/30/2011 FINAL   Final   CULTURE, BLOOD (ROUTINE X 2)     Status: Normal   Collection Time   03/31/11 10:55 AM      Component Value Range Status Comment   Specimen Description BLOOD LEFT HAND   Final    Special Requests BOTTLES DRAWN AEROBIC ONLY 4CC   Final    Culture NO GROWTH 5 DAYS   Final    Report Status 04/05/2011 FINAL   Final   MRSA PCR SCREENING     Status: Normal   Collection Time   04/05/11 10:42 PM      Component Value Range Status Comment   MRSA by PCR NEGATIVE  NEGATIVE  Final    Studies/Results: Dg Femur Left  04/05/2011  *RADIOLOGY REPORT*  Clinical Data: Status post fall.  Pain.  LEFT FEMUR - 2 VIEW  Comparison: None.  Findings: No acute bony or joint abnormality is identified.  The hip and knee are located.  Soft tissues are unremarkable.  IMPRESSION: Negative exam.  Original Report Authenticated By: Arvid Right. D'ALESSIO, M.D.   Dg Tibia/fibula Left  04/05/2011  *RADIOLOGY REPORT*  Clinical Data: Recent fall.  LEFT TIBIA AND FIBULA - 2 VIEW  Comparison: None.  Findings: There are no fractures or dislocations.  The bones appear intrinsically normal as do the soft tissues.  IMPRESSION: Negative study.  Original Report Authenticated By: Altamese Cabal, M.D.   Ct Foot Left Wo Contrast  04/06/2011  *RADIOLOGY REPORT*  Clinical Data: Diabetic with left foot pain.  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  Recent fall.  CT OF THE LEFT FOOT WITHOUT CONTRAST  Technique:  Multidetector CT imaging was performed according to the standard protocol. Multiplanar CT image reconstructions were also generated.  Comparison: Radiographs performed 04/05/2011.  No other prior studies available.  Findings: There is moderate generalized osteopenia.  Patient is status  post amputation of the fourth and fifth toes.  There are erosions of the fourth and fifth metatarsal heads.  The additional metatarsals and digits appear normal.  The alignment is normal at the Lisfranc joint.  There is no evidence of navicular fracture or cuboid erosion.  No focal hind foot abnormalities are seen.  There is subcutaneous edema throughout the foot.  No focal fluid collections are identified.  There are age advanced vascular calcifications.  No tendon abnormalities are seen.  IMPRESSION:  1.  No evidence of acute fracture or dislocation. 2.  Nonspecific erosions of the fourth and fifth metatarsal heads status post reported recent amputation of the corresponding toes. These erosions could be arthropathic, postsurgical or secondary to osteomyelitis.  Correlate clinically. 3.  Generalized osteopenia.  This limits osseous evaluation by CT.  MRI may be helpful for further evaluation if infection is a clinical concern.  Original Report Authenticated By: Vivia Ewing, M.D.   Dg Humerus Right  04/05/2011  *RADIOLOGY REPORT*  Clinical Data: Recent fall, hyperglycemia  RIGHT HUMERUS - 2+ VIEW  Comparison: None.  Findings: No acute fracture is seen.  The right shoulder is unremarkable other than a somewhat downward sloping acromion.  IMPRESSION: No acute fracture.  Original Report Authenticated By: Joretta Bachelor, M.D.   Dg Foot Complete Left  04/05/2011  *RADIOLOGY REPORT*  Clinical Data: History of blood clot left leg.  Fall.  LEFT FOOT - COMPLETE 3+ VIEW  Comparison: None.  Findings: There is been previous amputation of the left fourth and fifth toes at the metatarsal phalangeal joint level. There is an erosion associated with the dorsal portion of the cuboid bone seen on the lateral view. There is a possible subtle fracture of the medial aspect of the navicular bone versus variant anatomy. Recommend correlation with physical examination findings and possibly CT.  IMPRESSION: Previous amputations of  the left fourth and fifth toes at the metatarsophalangeal joint levels. Possible fracture of the navicular bone versus variant anatomy.  Suggest correlation with physical examination findings and possibly CT if felt to be indicated. Erosion associated with the cuboid bone as discussed above.  Original Report Authenticated By: Altamese Cabal, M.D.    Medications:  Prior to Admission:  Prescriptions prior to admission  Medication Sig Dispense Refill  . acetaminophen (TYLENOL) 500 MG tablet Take 1,000 mg by mouth every 6 (six) hours as needed. For pain       . ALPRAZolam (XANAX) 1 MG tablet Take 1 mg by mouth daily as needed. For anxiety      . amLODipine (NORVASC) 2.5 MG tablet Take 1 tablet (2.5 mg total) by mouth daily.  30 tablet  5  . cephALEXin (KEFLEX) 500 MG capsule Take 500 mg by mouth 3 (three) times daily.      . diphenhydrAMINE (BENADRYL) 25 MG tablet Take 25 mg by mouth at bedtime as needed. For sleep      . fish oil-omega-3 fatty acids 1000 MG capsule Take 1 g by mouth daily.      . furosemide (LASIX) 20 MG tablet Take 20 mg by mouth 3 (three) times a week. On Mondays, Wednesdays, and Fridays      . insulin aspart (NOVOLOG) 100 UNIT/ML injection Inject 5 Units into the skin 3 (three) times daily before meals.  1 vial  12  . insulin glargine (LANTUS) 100 UNIT/ML injection Inject 15 Units into the skin at bedtime.  10 mL  12  . Multiple Vitamins-Minerals (MULTIVITAMINS THER. W/MINERALS) TABS Take 1 tablet by mouth daily.        . ondansetron (ZOFRAN-ODT) 8 MG disintegrating tablet Take 8 mg by mouth every 12 (twelve) hours as needed. Nausea      . oxyCODONE-acetaminophen (PERCOCET) 10-325 MG per tablet Take 1 tablet by mouth every 6 (six) hours as needed for pain.  30 tablet  0  . potassium chloride SA (K-DUR,KLOR-CON) 20 MEQ tablet Take 0.5 tablets (10 mEq total) by mouth daily.  7 tablet  0  . pravastatin (PRAVACHOL) 40 MG tablet Take 1 tablet (40 mg total) by mouth daily.  30 tablet   5  . sertraline (ZOLOFT) 50 MG tablet Take 50 mg by mouth daily.       Marland Kitchen warfarin (COUMADIN)  3 MG tablet Take 3 mg by mouth daily.       Scheduled:   . amLODipine  2.5 mg Oral Daily  . furosemide  20 mg Oral 3 times weekly  . insulin aspart  0-5 Units Subcutaneous QHS  . insulin aspart  0-9 Units Subcutaneous TID WC  . insulin glargine  10 Units Subcutaneous QHS  . insulin regular  10 Units Subcutaneous Once  . mulitivitamin with minerals  1 tablet Oral Daily  . omega-3 acid ethyl esters  1 g Oral Daily  . pantoprazole  40 mg Oral Q1200  . piperacillin-tazobactam (ZOSYN)  IV  3.375 g Intravenous Q8H  . potassium chloride SA  10 mEq Oral Daily  . sertraline  50 mg Oral Daily  . simvastatin  20 mg Oral q1800  . sodium chloride      . vancomycin  1,000 mg Intravenous Q12H  . warfarin  3 mg Oral q1800  . Warfarin - Physician Dosing Inpatient   Does not apply q1800  . DISCONTD: insulin regular  0-10 Units Intravenous TID WC  . DISCONTD: insulin regular  0-10 Units Intravenous TID WC   Continuous:   . sodium chloride    . DISCONTD: sodium chloride    . DISCONTD: sodium chloride 125 mL/hr at 04/05/11 2021  . DISCONTD: sodium chloride 100 mL/hr at 04/06/11 0000  . DISCONTD: dextrose 5 % and 0.45% NaCl 75 mL/hr at 04/07/11 0603  . DISCONTD: insulin (NOVOLIN-R) infusion Stopped (04/06/11 0020)   [id number], ALPRAZolam, dextrose, dextrose, diphenhydrAMINE, HYDROmorphone (DILAUDID) injection, ondansetron, oxyCODONE-acetaminophen, DISCONTD:  HYDROmorphone (DILAUDID) injection  Assesment: She has severe pain in her foot. This is the foot that had amputation of multiple toes. She is perhaps with osteomyelitis. Her pain is not totally controlled so I have modified her pain medications. Active Problems:  * No active hospital problems. *     Plan: Increase her pain medications continue with everything else I wait the results of the MRI    LOS: 2 days   Garnetta Fedrick  L 04/07/2011, 7:42 AM

## 2011-04-07 NOTE — Progress Notes (Signed)
Subjective: Complains of left foot pain. No chest pain, palpitations, breathlessness. Appetite is stable.   Objective: Temp:  [98 F (36.7 C)-98.8 F (37.1 C)] 98.1 F (36.7 C) (03/20 0800) Pulse Rate:  [83-120] 95  (03/20 0800) Resp:  [10-21] 16  (03/20 0800) BP: (96-162)/(66-144) 123/69 mmHg (03/20 0500) SpO2:  [95 %-100 %] 98 % (03/20 0800)  I/O last 3 completed shifts: In: 5005 [P.O.:1440; I.V.:2765; IV Piggyback:800] Out: 2000 [Urine:2000]  Telemetry - Sinus rhythm and sinus tachycardia, no atrial fibrillation.  Exam -   General - Chronically ill-appearing, NAD.  Lungs - Course, no wheezing.  Cardiac - RRR, no S3.  Abdomen - NABS.  Extremities - Decreased distal pulses, status post fourth and fifth toe amputation on left. Some stasis. Tender to touch  Testing -   Lab Results  Component Value Date   WBC 15.9* 04/05/2011   HGB 11.4* 04/05/2011   HCT 34.9* 04/05/2011   MCV 92.8 04/05/2011   PLT 309 04/05/2011    Lab Results  Component Value Date   CREATININE 1.12* 04/06/2011   BUN 17 04/06/2011   NA 134* 04/06/2011   K 3.6 04/06/2011   CL 103 04/06/2011   CO2 24 04/06/2011    Lab Results  Component Value Date   TROPONINI <0.30 03/31/2011    Current Medications    . amLODipine  2.5 mg Oral Daily  . dextrose      . furosemide  20 mg Oral 3 times weekly  . insulin aspart  0-5 Units Subcutaneous QHS  . insulin aspart  0-9 Units Subcutaneous TID WC  . insulin glargine  10 Units Subcutaneous QHS  . insulin regular  10 Units Subcutaneous Once  . mulitivitamin with minerals  1 tablet Oral Daily  . omega-3 acid ethyl esters  1 g Oral Daily  . pantoprazole  40 mg Oral Q1200  . piperacillin-tazobactam (ZOSYN)  IV  3.375 g Intravenous Q8H  . potassium chloride SA  10 mEq Oral Daily  . sertraline  50 mg Oral Daily  . simvastatin  20 mg Oral q1800  . sodium chloride      . vancomycin  1,000 mg Intravenous Q12H  . warfarin  3 mg Oral q1800  . Warfarin -  Physician Dosing Inpatient   Does not apply q1800  . DISCONTD: insulin regular  0-10 Units Intravenous TID WC  . DISCONTD: insulin regular  0-10 Units Intravenous TID WC     Assessment:  1. Abnormal ECG as noted previously, poor R wave progression and also possible old lateral infarct pattern. No recent chest pain, and echocardiogram from January demonstrated LVEF of 65-70% without regional wall motion abnormalities.  2. 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 She is complaining of left foot pain at this point, undergoing workup for possible osteomyelitis, MRI results pending. On broad-spectrum antibiotics.  3. Ongoing tobacco abuse.  4. Hyperlipidemia, on statin therapy.  5. Aspirin allergy. She is currently on Coumadin, subtherapeutic INR.  6. Type 2 diabetes mellitus.  Plan:  Continue medical therapy and ongoing evaluation for osteomyelitis as directed by Dr. Luan Pulling. Telemetry does not identify any atrial fibrillation or dysrhythmias that might be contributory to an embolic event. No chest pain to suggest recent ACS, and ECG findings are old. We will continue to follow with you, and can ultimately schedule a followup Myoview for  ischemic surveillance once she is more clinically stable, perhaps as an outpatient.   Satira Sark, M.D., F.A.C.C.

## 2011-04-07 NOTE — Progress Notes (Signed)
0800 CBG 41, PT GIVEN SODA AND BREAKFAST; 0900 CBG 39-1/2 AMP D50 GIVEN  0945 CBG 61--ANOTHER 1/2 AMP D50 GIVEN. 1022 CBG UP TO 135.

## 2011-04-07 NOTE — Progress Notes (Signed)
ANTIBIOTIC CONSULT NOTE   Pharmacy Consult for Vancomycin and Zosyn Indication: cellulitis of face vs osteomyelitis of foot  Allergies  Allergen Reactions  . Aspirin Other (See Comments)    Cannot take this medication due to kidney disease  . Ibuprofen Other (See Comments)    Kidney disease   Patient Measurements: Height: 5\' 1"  (154.9 cm) Weight: 149 lb 7.6 oz (67.8 kg) IBW/kg (Calculated) : 47.8   Vital Signs: Temp: 98.1 F (36.7 C) (03/20 0800) Temp src: Oral (03/20 0800) BP: 123/69 mmHg (03/20 0500) Pulse Rate: 95  (03/20 0800) Intake/Output from previous day: 03/19 0701 - 03/20 0700 In: 3718.8 [P.O.:1440; I.V.:1728.8; IV Piggyback:550] Out: 2000 [Urine:2000] Intake/Output from this shift: Total I/O In: 555 [P.O.:480; I.V.:75] Out: -   Labs:  Basename 04/06/11 0252 04/05/11 1524 04/04/11 1230  WBC -- 15.9* 13.4*  HGB -- 11.4* 11.0*  PLT -- 309 150  LABCREA -- -- --  CREATININE 1.12* 1.23* 0.97   Estimated Creatinine Clearance: 58.2 ml/min (by C-G formula based on Cr of 1.12).  Basename 04/07/11 0942  VANCOTROUGH 24.3*  VANCOPEAK --  Jake Michaelis --  GENTTROUGH --  GENTPEAK --  GENTRANDOM --  TOBRATROUGH --  TOBRAPEAK --  TOBRARND --  AMIKACINPEAK --  AMIKACINTROU --  AMIKACIN --    Microbiology: Recent Results (from the past 720 hour(s))  MRSA PCR SCREENING     Status: Normal   Collection Time   03/28/11 12:00 AM      Component Value Range Status Comment   MRSA by PCR NEGATIVE  NEGATIVE  Final   URINE CULTURE     Status: Normal   Collection Time   03/29/11  7:30 PM      Component Value Range Status Comment   Specimen Description URINE, CLEAN CATCH   Final    Special Requests NONE   Final    Culture  Setup Time [id number]   Final    Colony Count NO GROWTH   Final    Culture NO GROWTH   Final    Report Status 03/30/2011 FINAL   Final   CULTURE, BLOOD (ROUTINE X 2)     Status: Normal   Collection Time   03/31/11 10:55 AM      Component Value  Range Status Comment   Specimen Description BLOOD LEFT HAND   Final    Special Requests BOTTLES DRAWN AEROBIC ONLY 4CC   Final    Culture NO GROWTH 5 DAYS   Final    Report Status 04/05/2011 FINAL   Final   MRSA PCR SCREENING     Status: Normal   Collection Time   04/05/11 10:42 PM      Component Value Range Status Comment   MRSA by PCR NEGATIVE  NEGATIVE  Final    Medical History: Past Medical History  Diagnosis Date  . Shingles     11/2010  . Cellulitis     of face - 12/2010  . Peripheral vascular disease in diabetes mellitus   . Hyperlipidemia   . Tobacco abuse     0.5 - 1ppd x 21 yrs  . Headache   . Renal disorder     glomerulonephritis  . Blood transfusion   . Anxiety   . Depression   . Peripheral vascular disease   . Diabetes mellitus     IDDM Since Age 15  . Embolism - blood clot    Medications:  Scheduled:     . amLODipine  2.5 mg Oral Daily  .  dextrose      . furosemide  20 mg Oral 3 times weekly  . insulin aspart  0-5 Units Subcutaneous QHS  . insulin aspart  0-9 Units Subcutaneous TID WC  . insulin glargine  10 Units Subcutaneous QHS  . insulin regular  10 Units Subcutaneous Once  . mulitivitamin with minerals  1 tablet Oral Daily  . omega-3 acid ethyl esters  1 g Oral Daily  . pantoprazole  40 mg Oral Q1200  . piperacillin-tazobactam (ZOSYN)  IV  3.375 g Intravenous Q8H  . potassium chloride SA  10 mEq Oral Daily  . sertraline  50 mg Oral Daily  . simvastatin  20 mg Oral q1800  . sodium chloride      . vancomycin  1,000 mg Intravenous Q12H  . warfarin  3 mg Oral q1800  . Warfarin - Physician Dosing Inpatient   Does not apply q1800  . DISCONTD: insulin regular  0-10 Units Intravenous TID WC  . DISCONTD: insulin regular  0-10 Units Intravenous TID WC   Assessment: Ok for protocol Trough slightly above goal  Goal of Therapy:  Vancomycin trough level 15-20 mcg/ml  Plan: Decrease Vancomycin to 750mg  IV every 12 hours Zosyn 3.375 GM IV every 8  hours Monitor labs per protocol Measure antibiotic drug levels at steady state  Phillipa Morden A 04/07/2011,10:55 AM

## 2011-04-08 ENCOUNTER — Ambulatory Visit (HOSPITAL_COMMUNITY)
Admit: 2011-04-08 | Discharge: 2011-04-08 | Disposition: A | Discharge status: Home / Self Care | Payer: Medicaid Other | Attending: Pulmonary Disease | Admitting: Pulmonary Disease

## 2011-04-08 LAB — BASIC METABOLIC PANEL
BUN: 13 mg/dL (ref 6–23)
CO2: 25 mEq/L (ref 19–32)
Calcium: 8 mg/dL — ABNORMAL LOW (ref 8.4–10.5)
Chloride: 101 mEq/L (ref 96–112)
Creatinine, Ser: 1.06 mg/dL (ref 0.50–1.10)
GFR calc Af Amer: 75 mL/min — ABNORMAL LOW (ref >90)
GFR calc non Af Amer: 64 mL/min — ABNORMAL LOW (ref >90)
Glucose, Bld: 401 mg/dL — ABNORMAL HIGH (ref 70–99)
Potassium: 3.5 mEq/L (ref 3.5–5.1)
Sodium: 136 mEq/L (ref 135–145)

## 2011-04-08 LAB — PROTIME-INR
INR: 1.35 (ref 0.00–1.49)
Prothrombin Time: 16.9 seconds — ABNORMAL HIGH (ref 11.6–15.2)

## 2011-04-08 LAB — GLUCOSE, CAPILLARY
Glucose-Capillary: 28 mg/dL — CL (ref 70–99)
Glucose-Capillary: 377 mg/dL — ABNORMAL HIGH (ref 70–99)
Glucose-Capillary: 279 mg/dL — ABNORMAL HIGH (ref 70–99)
Glucose-Capillary: 108 mg/dL — ABNORMAL HIGH (ref 70–99)
Glucose-Capillary: 312 mg/dL — ABNORMAL HIGH (ref 70–99)

## 2011-04-08 MED ORDER — OXYCODONE-ACETAMINOPHEN 5-325 MG PO TABS
2.0000 | ORAL_TABLET | Freq: Four times a day (QID) | ORAL | Status: DC | PRN
  Administered 2011-04-08 – 2011-04-09 (×2): 2 via ORAL
  Filled 2011-04-08 (×3): qty 2

## 2011-04-08 MED ORDER — INSULIN GLARGINE 100 UNIT/ML ~~LOC~~ SOLN
26.0000 [IU] | Freq: Every day | SUBCUTANEOUS | Status: DC
  Administered 2011-04-08: 26 [IU] via SUBCUTANEOUS

## 2011-04-08 MED ORDER — ZOLPIDEM TARTRATE 5 MG PO TABS
10.0000 mg | ORAL_TABLET | Freq: Once | ORAL | Status: AC
  Administered 2011-04-08: 10 mg via ORAL
  Filled 2011-04-08: qty 2

## 2011-04-08 MED ORDER — INSULIN ASPART 100 UNIT/ML ~~LOC~~ SOLN: 8.0000 [IU] | Freq: Three times a day (TID) | SUBCUTANEOUS | Status: DC

## 2011-04-08 MED ORDER — ENOXAPARIN SODIUM 80 MG/0.8ML ~~LOC~~ SOLN
1.0000 mg/kg | Freq: Two times a day (BID) | SUBCUTANEOUS | Status: DC
  Administered 2011-04-08 – 2011-04-11 (×7): 70 mg via SUBCUTANEOUS
  Filled 2011-04-08 (×7): qty 0.8

## 2011-04-08 NOTE — Consult Note (Signed)
  [id number] 

## 2011-04-08 NOTE — Progress Notes (Addendum)
SUBJECTIVE:Complaints of left foot pain  Active Problems:  * No active hospital problems. *    LABS: Basic Metabolic Panel:  Basename 04/06/11 0252 04/05/11 1524  NA 134* 131*  K 3.6 4.6  CL 103 97  CO2 24 18*  GLUCOSE 164* 692*  BUN 17 20  CREATININE 1.12* 1.23*  CALCIUM 7.5* 8.2*  MG -- --  PHOS -- --   CBC:  Basename 04/05/11 1524  WBC 15.9*  NEUTROABS 12.3*  HGB 11.4*  HCT 34.9*  MCV 92.8  PLT 309    RADIOLOGY: Ct Foot Left Wo Contrast  04/06/2011  *RADIOLOGY REPORT*  Clinical Data: Diabetic with left foot pain.  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  Recent fall.  CT OF THE LEFT FOOT WITHOUT CONTRAST  Technique:  Multidetector CT imaging was performed according to the standard protocol. Multiplanar CT image reconstructions were also generated.  Comparison: Radiographs performed 04/05/2011.  No other prior studies available.  Findings: There is moderate generalized osteopenia.  Patient is status post amputation of the fourth and fifth toes.  There are erosions of the fourth and fifth metatarsal heads.  The additional metatarsals and digits appear normal.  The alignment is normal at the Lisfranc joint.  There is no evidence of navicular fracture or cuboid erosion.  No focal hind foot abnormalities are seen.  There is subcutaneous edema throughout the foot.  No focal fluid collections are identified.  There are age advanced vascular calcifications.  No tendon abnormalities are seen.  IMPRESSION:  1.  No evidence of acute fracture or dislocation. 2.  Nonspecific erosions of the fourth and fifth metatarsal heads status post reported recent amputation of the corresponding toes. These erosions could be arthropathic, postsurgical or secondary to osteomyelitis.  Correlate clinically. 3.  Generalized osteopenia.  This limits osseous evaluation by CT.  MRI may be helpful for further evaluation if infection is a clinical concern.  Original Report Authenticated  By: Vivia Ewing, M.D.   Mr Foot Left Wo Contrast  04/07/2011  *RADIOLOGY REPORT*  Clinical Data: Left foot pain.  Abnormal CT scan.  Recent amputation of the fourth and fifth toes.  MRI OF THE LEFT FOREFOOT WITHOUT CONTRAST  Technique:  Multiplanar, multisequence MR imaging was performed. No intravenous contrast was administered.  Comparison: 04/06/2011  Findings: The the patient refused IV contrast administration.  I reviewed Dr. Jess Barters operative note from 03/03/2011.  Based on the note, the resection was at the level of the fourth and fifth metatarsal necks because there was inadequate healthy tissue overlying the amputation site to spare of the metatarsal heads. Accordingly, the absence of the distal metatarsal heads is an expected postoperative finding, and the low-level edema in the adjacent metatarsal neck is a reasonable expectation for 1 month postoperative appearance of resection at the level of the distal metatarsal neck.  Thus we do not demonstrate specific indicators of osteomyelitis in the fourth and fifth digits.  There is low-level abnormal edema signal in the distal phalanx of the great toe which may represent osteomyelitis, for example on images five through seven of series 8.  Low-level edema in the plantar portion of the medial cuneiform is present, favoring mild stress reaction over infectious process.  Dorsal subcutaneous edema is present in the foot along with edema tracking along the plantar musculature of the foot.  IMPRESSION:  1.  The patient's prior amputation of the fourth and fifth digits were at the level of the metatarsal  neck.  The small amount of residual edema in the adjacent marrow is compatible with a postoperative finding, and not specific for osteomyelitis. 2.  However, there is low-level abnormal edema in the distal phalanx of the great toe, which could represent mild osteomyelitis. This may warrant careful observation radiographically. 3.  Dorsal subcutaneous edema in  the foot - cellulitis is not excluded.  There is also abnormal edema tracking along the plantar musculature of the foot possibly reflecting myositis. 4.  The patient refused IV contrast. 5.  Low-level edema inferiorly in the medial cuneiform favors a stress reaction over infection.  Original Report Authenticated By: Carron Curie, M.D.     PHYSICAL EXAM BP 150/75  Pulse 121  Temp(Src) 98.6 F (37 C) (Oral)  Resp 16  Ht 5\' 1"  (1.549 m)  Wt 149 lb 7.6 oz (67.8 kg)  BMI 28.24 kg/m2  SpO2 95%  LMP 12/28/2010 General: Well developed, well nourished, in no acute distress Head: Eyes PERRLA, No xanthomas.   Normal cephalic and atramatic  Lungs: Clear bilaterally to auscultation and percussion. Does not take deep breaths Heart: HRRR S1 S2 tachycardic, No MRG .  Pulses are 2+ & equal.            No carotid bruit. [name].  No abdominal bruits. No femoral bruits. Abdomen: Bowel sounds are positive, abdomen soft and non-tender without masses or                  Hernia's noted. Msk:  Back normal, normal gait. Normal strength and tone for age. Extremities: Left foot edematous, discolored with necrosis of the great toe, amp of 4th and 5th metatarsals Neuro: Alert and oriented X 3. Psych:  Good affect, responds appropriately  TELEMETRY: Reviewed telemetry pt in sinus tachycardia 120's  ASSESSMENT AND PLAN:  1. Abnormal ECG: Denies chest pain. No abnormalities on recent echo. Plan to have stress test as OP once she recovers from osteomyelitis of her left foot. Per Dr.s Lattie Haw and Domenic Polite, ECG findings are old.  No acute changes are identified this admsision  2. 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  No plans to hold this unless surgery is planned to left foot. No evidence of atrial arrhythmias contributing.  Phill Myron. Purcell Nails NP [facility] 04/08/2011, 9:10 AM  Cardiology  Attending Patient interviewed and examined. Discussed with [name], NP.  Above note annotated and modified based upon my findings.  Might as well proceed with a pharmacologic stress nuclear study as planned.  [name], MD 04/08/2011, 6:45 PM

## 2011-04-08 NOTE — Consult Note (Signed)
[name], [name]                ACCOUNT NO.:  [id number]  MEDICAL RECORD NO.:  [id number]  LOCATION:  [facility]02                          FACILITY:  [facility]  PHYSICIAN:  [name], MD DATE OF BIRTH:  09-05-1970  DATE OF CONSULTATION:  04/08/2011 DATE OF DISCHARGE:                                CONSULTATION   REASON FOR CONSULTATION:  Uncontrolled type 1 diabetes.  HISTORY OF PRESENT ILLNESS:  This is a 41 year old female patient with longstanding uncontrolled type 1 diabetes for approximately 32 years. She was seen by myself last week in a consult for the same when she presented with severe hyperglycemia.  She was discharged on basal bolus insulin; however, returned within the next day with a presentation consistent with diabetic ketoacidosis.  The patient was treated initially with insulin infusion now on basal bolus insulin using Lantus insulin, regular insulin, as well as NovoLog sliding scale.  Her recent A1c was high at 11.5%.  She has severe complications including severe peripheral arterial disease with a chronic left heel ulcer.  She has a noncompliance to outpatient followup.  She did not make it to clinic appointments on several attempts.  She admits that she does not monitor her blood glucose regularly and she has questionable compliance to insulin administration at home as well.  PAST MEDICAL HISTORY:  Type 1 diabetes, peripheral arterial disease with chronic left lower extremity ulcer.  She has a history of left fourth and fifth toe amputation on the lower extremity.  She also has hyperlipidemia, tobacco abuse, anxiety, and depression.  MEDICATIONS:  Medications at home include Xanax, Benadryl, amlodipine, Lasix, Lantus, NovoLog, Lovaza, Protonix, Zoloft, Zocor, and Ambien.  In house she is currently on Zosyn 3.375 g every 8 hours.  FAMILY HISTORY:  Significant for COPD in her mother, as well as hypertension and diabetes.  SOCIAL HISTORY:  Significant for  heavy smoking.  No current alcohol or drug use.  REVIEW OF SYSTEMS:  At this point, the patient complains pain from her left lower extremity.  No chest pain.  No shortness of breath.  No bleeding.  The rest of the systems have been reviewed and are negative.  PHYSICAL EXAMINATION:  GENERAL:  She is alert and oriented x3 in her ICU bed. VITAL SIGNS:  Blood pressure of 103/43, pulse rate 120, and temperature 98.4. HEENT:  Significant for pale conjunctivae. NECK:  [name]. CHEST:  Clear to auscultation bilaterally. CARDIOVASCULAR:  Normal S1 and S2.  No murmur.  No gallop. ABDOMEN:  Soft and nontender. EXTREMITIES:  She has a cast on the left lower extremity over a nonhealing diabetic ulcer.  She also had fourth and fifth left toe amputation from peripheral arterial disease.  LABORATORY DATA:  Her recent lab work showed sodium 136, potassium 3.5, chloride 101, bicarb 25, BUN 13, creatinine 1.26, and calcium 8.0.  GFR is 64.  Her most recent A1c was 11.5% consistent with uncontrolled diabetes.  Her current blood glucose ranges in the range of 300-400 mg/dL.  ASSESSMENT: 1. Type 1 diabetes, chronically uncontrolled. 2. Noncompliance to outpatient diabetes followup. 3. Severe peripheral vascular disease with current diabetic ulcer on     the left lower  extremity. 4. Hyperlipidemia. 5. Tobacco abuse. 6. Mood disorder. 7. Severe hyperglycemia.  PLAN:  The patient is status post treatment and conversion with IV insulin, currently on basal bolus insulin.  I will readjust her insulin regimen as follows.  Continue with increased Lantus at 26 units at bedtime.  Discontinue regular insulin.  Discontinue NovoLog at bedtime. Initiate NovoLog 8 units t.i.d. a.c. plus low-dose sliding scale. Continue blood glucose monitoring a.c. and bedtime, and I will re- evaluate the patient in the morning with blood glucose readings to adjust her insulin therapy.  Once again, the patient is counseled  regarding acute and chronic complications from type 1 diabetes and the need for outpatient treatment and followup.  I will continue to see her in house as well as an outpatient if she makes it to the clinic.  Dear Dr. Luan Pulling, thank you for the opportunity to participate in the care of this pleasant patient.          ______________________________ [name], MD     GN/MEDQ  D:  04/08/2011  T:  04/08/2011  Job:  [id number]

## 2011-04-08 NOTE — Progress Notes (Signed)
Subjective: She continues to have pain in the foot. I discussed her situation by phone with infectious disease consultant it was felt that considering the situation she should be treated as if she has osteomyelitis for a period of 6 weeks with IV antibiotics. She is going to require a PICC line for this and I discussed that with our IV team and they say that she's going to have to go to [facility] because they've tried to get a PICC line in her in the past and been unsuccessful  Objective: Vital signs in last 24 hours: Temp:  [98.4 F (36.9 C)-98.9 F (37.2 C)] 98.4 F (36.9 C) (03/21 1632) Pulse Rate:  [101-126] 120  (03/21 1632) Resp:  [7-34] 18  (03/21 1632) BP: (103-160)/(42-94) 103/42 mmHg (03/21 1632) SpO2:  [91 %-99 %] 93 % (03/21 1632) Weight change:  Last BM Date: 04/07/11  Intake/Output from previous day: 03/20 0701 - 03/21 0700 In: 4455 [P.O.:2280; I.V.:1725; IV Piggyback:450] Out: 4600 [Urine:4600]  PHYSICAL EXAM General appearance: alert, cooperative and mild distress Resp: clear to auscultation bilaterally Cardio: regular rate and rhythm, S1, S2 normal, no murmur, click, rub or gallop GI: soft, non-tender; bowel sounds normal; no masses,  no organomegaly Extremities: Previous amputation  Lab Results:    Basic Metabolic Panel:  Basename 04/08/11 0920 04/06/11 0252  NA 136 134*  K 3.5 3.6  CL 101 103  CO2 25 24  GLUCOSE 401* 164*  BUN 13 17  CREATININE 1.06 1.12*  CALCIUM 8.0* 7.5*  MG -- --  PHOS -- --   Liver Function Tests: No results found for this basename: AST:2,ALT:2,ALKPHOS:2,BILITOT:2,PROT:2,ALBUMIN:2 in the last 72 hours No results found for this basename: LIPASE:2,AMYLASE:2 in the last 72 hours No results found for this basename: AMMONIA:2 in the last 72 hours CBC: No results found for this basename: WBC:2,NEUTROABS:2,HGB:2,HCT:2,MCV:2,PLT:2 in the last 72 hours Cardiac Enzymes: No results found for this basename:  CKTOTAL:3,CKMB:3,CKMBINDEX:3,TROPONINI:3 in the last 72 hours BNP: No results found for this basename: PROBNP:3 in the last 72 hours D-Dimer: No results found for this basename: DDIMER:2 in the last 72 hours CBG:  Basename 04/08/11 1631 04/08/11 1106 04/08/11 0921 04/07/11 2108 04/07/11 1636 04/07/11 1141  GLUCAP 108* 279* 377* 159* 120* 108*   Hemoglobin A1C: No results found for this basename: HGBA1C in the last 72 hours Fasting Lipid Panel: No results found for this basename: CHOL,HDL,LDLCALC,TRIG,CHOLHDL,LDLDIRECT in the last 72 hours Thyroid Function Tests: No results found for this basename: TSH,T4TOTAL,FREET4,T3FREE,THYROIDAB in the last 72 hours Anemia Panel: No results found for this basename: VITAMINB12,FOLATE,FERRITIN,TIBC,IRON,RETICCTPCT in the last 72 hours Coagulation:  Basename 04/08/11 0908 04/07/11 0519  LABPROT 16.9* 15.2  INR 1.35 1.18   Urine Drug Screen: Drugs of Abuse     Component Value Date/Time   LABOPIA NONE DETECTED 03/21/2010 2130   COCAINSCRNUR POSITIVE* 03/21/2010 2130   LABBENZ NONE DETECTED 03/21/2010 2130   AMPHETMU NONE DETECTED 03/21/2010 2130   THCU NONE DETECTED 03/21/2010 2130   LABBARB  Value: NONE DETECTED        DRUG SCREEN FOR MEDICAL PURPOSES ONLY.  IF CONFIRMATION IS NEEDED FOR ANY PURPOSE, NOTIFY LAB WITHIN 5 DAYS.        LOWEST DETECTABLE LIMITS FOR URINE DRUG SCREEN Drug Class       Cutoff (ng/mL) Amphetamine      1000 Barbiturate      200 Benzodiazepine   [id number] Tricyclics       [id number] Opiates  300 Cocaine          300 THC              50 03/21/2010 2130    Alcohol Level: No results found for this basename: ETH:2 in the last 72 hours Urinalysis: No results found for this basename: COLORURINE:2,APPERANCEUR:2,LABSPEC:2,PHURINE:2,GLUCOSEU:2,HGBUR:2,BILIRUBINUR:2,KETONESUR:2,PROTEINUR:2,UROBILINOGEN:2,NITRITE:2,LEUKOCYTESUR:2 in the last 72 hours Misc. Labs:  ABGS No results found for this basename: PHART,PCO2,PO2ART,TCO2,HCO3 in the last 72  hours CULTURES Recent Results (from the past 240 hour(s))  URINE CULTURE     Status: Normal   Collection Time   03/29/11  7:30 PM      Component Value Range Status Comment   Specimen Description URINE, CLEAN CATCH   Final    Special Requests NONE   Final    Culture  Setup Time [id number]   Final    Colony Count NO GROWTH   Final    Culture NO GROWTH   Final    Report Status 03/30/2011 FINAL   Final   CULTURE, BLOOD (ROUTINE X 2)     Status: Normal   Collection Time   03/31/11 10:55 AM      Component Value Range Status Comment   Specimen Description BLOOD LEFT HAND   Final    Special Requests BOTTLES DRAWN AEROBIC ONLY 4CC   Final    Culture NO GROWTH 5 DAYS   Final    Report Status 04/05/2011 FINAL   Final   MRSA PCR SCREENING     Status: Normal   Collection Time   04/05/11 10:42 PM      Component Value Range Status Comment   MRSA by PCR NEGATIVE  NEGATIVE  Final    Studies/Results: Mr Foot Left Wo Contrast  04/07/2011  *RADIOLOGY REPORT*  Clinical Data: Left foot pain.  Abnormal CT scan.  Recent amputation of the fourth and fifth toes.  MRI OF THE LEFT FOREFOOT WITHOUT CONTRAST  Technique:  Multiplanar, multisequence MR imaging was performed. No intravenous contrast was administered.  Comparison: 04/06/2011  Findings: The the patient refused IV contrast administration.  I reviewed Dr. Jess Barters operative note from 03/03/2011.  Based on the note, the resection was at the level of the fourth and fifth metatarsal necks because there was inadequate healthy tissue overlying the amputation site to spare of the metatarsal heads. Accordingly, the absence of the distal metatarsal heads is an expected postoperative finding, and the low-level edema in the adjacent metatarsal neck is a reasonable expectation for 1 month postoperative appearance of resection at the level of the distal metatarsal neck.  Thus we do not demonstrate specific indicators of osteomyelitis in the fourth and fifth digits.  There  is low-level abnormal edema signal in the distal phalanx of the great toe which may represent osteomyelitis, for example on images five through seven of series 8.  Low-level edema in the plantar portion of the medial cuneiform is present, favoring mild stress reaction over infectious process.  Dorsal subcutaneous edema is present in the foot along with edema tracking along the plantar musculature of the foot.  IMPRESSION:  1.  The patient's prior amputation of the fourth and fifth digits were at the level of the metatarsal neck.  The small amount of residual edema in the adjacent marrow is compatible with a postoperative finding, and not specific for osteomyelitis. 2.  However, there is low-level abnormal edema in the distal phalanx of the great toe, which could represent mild osteomyelitis. This may warrant careful observation radiographically. 3.  Dorsal  subcutaneous edema in the foot - cellulitis is not excluded.  There is also abnormal edema tracking along the plantar musculature of the foot possibly reflecting myositis. 4.  The patient refused IV contrast. 5.  Low-level edema inferiorly in the medial cuneiform favors a stress reaction over infection.  Original Report Authenticated By: Carron Curie, M.D.   Ir US Guide Vasc Access Right  04/08/2011  *RADIOLOGY REPORT*  Clinical Data: Diabetic infected fOOT, access for antibiotics  PICC LINE PLACEMENT WITH ULTRASOUND AND FLUOROSCOPIC  GUIDANCE  Fluoroscopy Time: 0.1 minutes.  The right arm was prepped with chlorhexidine, draped in the usual sterile fashion using maximum barrier technique (cap and mask, sterile gown, sterile gloves, large sterile sheet, hand hygiene and cutaneous antisepsis) and infiltrated locally with 1% Lidocaine.  Ultrasound demonstrated patency of the right basilic vein, and this was documented with an image.  Under real-time ultrasound guidance, this vein was accessed with a 21 gauge micropuncture needle and image documentation was  performed.  The needle was exchanged over a guidewire for a peel-away sheath through which a 5 [place] double lumen PICC trimmed to 24 cm was advanced, positioned with its tip at the lower SVC/right atrial junction.  Fluoroscopy during the procedure and fluoro spot radiograph confirms appropriate catheter position.  The catheter was flushed, secured to the skin with Prolene sutures, and covered with a sterile dressing.  Complications:  No immediate  IMPRESSION: Successful right arm PICC line placement with ultrasound and fluoroscopic guidance.  The catheter is ready for use.  Original Report Authenticated By: Jerilynn Mages. Daryll Brod, M.D.   Ir Fluoro Guide Cv Midline Picc Right  04/08/2011  *RADIOLOGY REPORT*  Clinical Data: Diabetic infected fOOT, access for antibiotics  PICC LINE PLACEMENT WITH ULTRASOUND AND FLUOROSCOPIC  GUIDANCE  Fluoroscopy Time: 0.1 minutes.  The right arm was prepped with chlorhexidine, draped in the usual sterile fashion using maximum barrier technique (cap and mask, sterile gown, sterile gloves, large sterile sheet, hand hygiene and cutaneous antisepsis) and infiltrated locally with 1% Lidocaine.  Ultrasound demonstrated patency of the right basilic vein, and this was documented with an image.  Under real-time ultrasound guidance, this vein was accessed with a 21 gauge micropuncture needle and image documentation was performed.  The needle was exchanged over a guidewire for a peel-away sheath through which a 5 [place] double lumen PICC trimmed to 24 cm was advanced, positioned with its tip at the lower SVC/right atrial junction.  Fluoroscopy during the procedure and fluoro spot radiograph confirms appropriate catheter position.  The catheter was flushed, secured to the skin with Prolene sutures, and covered with a sterile dressing.  Complications:  No immediate  IMPRESSION: Successful right arm PICC line placement with ultrasound and fluoroscopic guidance.  The catheter is ready for use.   Original Report Authenticated By: Jerilynn Mages. Daryll Brod, M.D.    Medications:  Prior to Admission:  Prescriptions prior to admission  Medication Sig Dispense Refill  . acetaminophen (TYLENOL) 500 MG tablet Take 1,000 mg by mouth every 6 (six) hours as needed. For pain       . ALPRAZolam (XANAX) 1 MG tablet Take 1 mg by mouth daily as needed. For anxiety      . amLODipine (NORVASC) 2.5 MG tablet Take 1 tablet (2.5 mg total) by mouth daily.  30 tablet  5  . cephALEXin (KEFLEX) 500 MG capsule Take 500 mg by mouth 3 (three) times daily.      . diphenhydrAMINE (BENADRYL) 25 MG tablet Take  25 mg by mouth at bedtime as needed. For sleep      . fish oil-omega-3 fatty acids 1000 MG capsule Take 1 g by mouth daily.      . furosemide (LASIX) 20 MG tablet Take 20 mg by mouth 3 (three) times a week. On Mondays, Wednesdays, and Fridays      . insulin aspart (NOVOLOG) 100 UNIT/ML injection Inject 5 Units into the skin 3 (three) times daily before meals.  1 vial  12  . insulin glargine (LANTUS) 100 UNIT/ML injection Inject 15 Units into the skin at bedtime.  10 mL  12  . Multiple Vitamins-Minerals (MULTIVITAMINS THER. W/MINERALS) TABS Take 1 tablet by mouth daily.        . ondansetron (ZOFRAN-ODT) 8 MG disintegrating tablet Take 8 mg by mouth every 12 (twelve) hours as needed. Nausea      . oxyCODONE-acetaminophen (PERCOCET) 10-325 MG per tablet Take 1 tablet by mouth every 6 (six) hours as needed for pain.  30 tablet  0  . potassium chloride SA (K-DUR,KLOR-CON) 20 MEQ tablet Take 0.5 tablets (10 mEq total) by mouth daily.  7 tablet  0  . pravastatin (PRAVACHOL) 40 MG tablet Take 1 tablet (40 mg total) by mouth daily.  30 tablet  5  . sertraline (ZOLOFT) 50 MG tablet Take 50 mg by mouth daily.       Marland Kitchen warfarin (COUMADIN) 3 MG tablet Take 3 mg by mouth daily.       Scheduled:   . amLODipine  2.5 mg Oral Daily  . enoxaparin (LOVENOX) injection  1 mg/kg Subcutaneous Q12H  . furosemide  20 mg Oral 3 times weekly    . insulin aspart  0-9 Units Subcutaneous TID WC  . insulin aspart  8 Units Subcutaneous TID WC  . insulin glargine  26 Units Subcutaneous QHS  . mulitivitamin with minerals  1 tablet Oral Daily  . omega-3 acid ethyl esters  1 g Oral Daily  . pantoprazole  40 mg Oral Q1200  . piperacillin-tazobactam (ZOSYN)  IV  3.375 g Intravenous Q8H  . potassium chloride SA  10 mEq Oral Daily  . sertraline  50 mg Oral Daily  . simvastatin  20 mg Oral q1800  . vancomycin  750 mg Intravenous Q12H  . warfarin  3 mg Oral q1800  . zolpidem  10 mg Oral Once  . DISCONTD: insulin aspart  0-5 Units Subcutaneous QHS  . DISCONTD: insulin glargine  10 Units Subcutaneous QHS  . DISCONTD: insulin regular  10 Units Subcutaneous Once  . DISCONTD: Warfarin - Physician Dosing Inpatient   Does not apply q1800   Continuous:   . sodium chloride 75 mL/hr at 04/08/11 1800   [id number], ALPRAZolam, dextrose, dextrose, diphenhydrAMINE, HYDROmorphone (DILAUDID) injection, ondansetron, oxyCODONE-acetaminophen  Assesment: She has diabetes. She had a thrombus in her aorta and developed clotting in the foot from that. She had amputation of toes because of the clotting in the foot. She has decreased pulses in her foot but does have pulses. I think she has osteomyelitis Active Problems:  * No active hospital problems. *     Plan: I will send her for PICC line placement and start working on getting her set for 6 weeks of IV antibiotics    LOS: 3 days   [name] L 04/08/2011, 6:27 PM

## 2011-04-08 NOTE — Procedures (Signed)
Successful RT DL PICC POWER TIP SVC/RA NO COMP STABLE READY TO USE

## 2011-04-08 NOTE — Progress Notes (Signed)
PT TO [facility] TO INTERVENTIONAL RADIOLOG FOR PICC LINE INSERTION. TRANSPORTED [place]

## 2011-04-08 NOTE — Consult Note (Signed)
ANTICOAGULATION CONSULT NOTE - Initial Consult  Pharmacy Consult for Lovenox Indication: clot in left foot  Allergies  Allergen Reactions  . Aspirin Other (See Comments)    Cannot take this medication due to kidney disease  . Ibuprofen Other (See Comments)    Kidney disease   Patient Measurements: Height: 5\' 1"  (154.9 cm) Weight: 149 lb 7.6 oz (67.8 kg) IBW/kg (Calculated) : 47.8   Vital Signs: Temp: 98.4 F (36.9 C) (03/21 0924) Temp src: Oral (03/21 0924) BP: 160/94 mmHg (03/21 0924) Pulse Rate: 126  (03/21 0924)  Labs:  [name] 04/08/11 0920 04/08/11 0908 04/07/11 0519 04/06/11 0252 04/05/11 1524  HGB -- -- -- -- 11.4*  HCT -- -- -- -- 34.9*  PLT -- -- -- -- 309  APTT -- -- -- -- --  LABPROT -- 16.9* 15.2 18.3* --  INR -- 1.35 1.18 1.49 --  HEPARINUNFRC -- -- -- -- --  CREATININE 1.06 -- -- 1.12* 1.23*  CKTOTAL -- -- -- -- --  CKMB -- -- -- -- --  TROPONINI -- -- -- -- --   Estimated Creatinine Clearance: 61.5 ml/min (by C-G formula based on Cr of 1.06).  Medical History: Past Medical History  Diagnosis Date  . Shingles     11/2010  . Cellulitis     of face - 12/2010  . Peripheral vascular disease in diabetes mellitus   . Hyperlipidemia   . Tobacco abuse     0.5 - 1ppd x 21 yrs  . Headache   . Renal disorder     glomerulonephritis  . Blood transfusion   . Anxiety   . Depression   . Peripheral vascular disease   . Diabetes mellitus     IDDM Since Age 56  . Embolism - blood clot    Medications:  Scheduled:    . amLODipine  2.5 mg Oral Daily  . enoxaparin (LOVENOX) injection  1 mg/kg Subcutaneous Q12H  . furosemide  20 mg Oral 3 times weekly  . insulin aspart  0-5 Units Subcutaneous QHS  . insulin aspart  0-9 Units Subcutaneous TID WC  . insulin glargine  10 Units Subcutaneous QHS  . insulin regular  10 Units Subcutaneous Once  . mulitivitamin with minerals  1 tablet Oral Daily  . omega-3 acid ethyl esters  1 g Oral Daily  . pantoprazole  40  mg Oral Q1200  . piperacillin-tazobactam (ZOSYN)  IV  3.375 g Intravenous Q8H  . potassium chloride SA  10 mEq Oral Daily  . sertraline  50 mg Oral Daily  . simvastatin  20 mg Oral q1800  . vancomycin  750 mg Intravenous Q12H  . warfarin  3 mg Oral q1800  . zolpidem  10 mg Oral Once  . DISCONTD: vancomycin  1,000 mg Intravenous Q12H  . DISCONTD: Warfarin - Physician Dosing Inpatient   Does not apply q1800    Assessment: Good renal fxn MD is managing Warfarin  Goal of Therapy:  Full anticoagulation with Lovenox   Plan: Lovenox 1mg /kg sq q12hrs Labs, CBC per protocol MD is managing Warfarin  [name] 04/08/2011,10:06 AM

## 2011-04-09 ENCOUNTER — Inpatient Hospital Stay (HOSPITAL_COMMUNITY): Payer: Medicaid Other

## 2011-04-09 ENCOUNTER — Encounter (HOSPITAL_COMMUNITY): Payer: Self-pay

## 2011-04-09 ENCOUNTER — Encounter (HOSPITAL_COMMUNITY): Payer: Self-pay | Admitting: Cardiology

## 2011-04-09 DIAGNOSIS — M86172 Other acute osteomyelitis, left ankle and foot: Secondary | ICD-10-CM | POA: Diagnosis present

## 2011-04-09 DIAGNOSIS — R9431 Abnormal electrocardiogram [ECG] [EKG]: Secondary | ICD-10-CM

## 2011-04-09 LAB — BASIC METABOLIC PANEL
BUN: 15 mg/dL (ref 6–23)
CO2: 29 mEq/L (ref 19–32)
Calcium: 7.6 mg/dL — ABNORMAL LOW (ref 8.4–10.5)
Chloride: 101 mEq/L (ref 96–112)
Creatinine, Ser: 1.34 mg/dL — ABNORMAL HIGH (ref 0.50–1.10)
GFR calc Af Amer: 56 mL/min — ABNORMAL LOW (ref >90)
GFR calc non Af Amer: 48 mL/min — ABNORMAL LOW (ref >90)
Glucose, Bld: 352 mg/dL — ABNORMAL HIGH (ref 70–99)
Potassium: 3 mEq/L — ABNORMAL LOW (ref 3.5–5.1)
Sodium: 136 mEq/L (ref 135–145)

## 2011-04-09 LAB — VANCOMYCIN, TROUGH: Vancomycin Tr: 25.6 ug/mL (ref 10.0–20.0)

## 2011-04-09 LAB — GLUCOSE, CAPILLARY
Glucose-Capillary: 302 mg/dL — ABNORMAL HIGH (ref 70–99)
Glucose-Capillary: 50 mg/dL — ABNORMAL LOW (ref 70–99)
Glucose-Capillary: 129 mg/dL — ABNORMAL HIGH (ref 70–99)
Glucose-Capillary: 71 mg/dL (ref 70–99)
Glucose-Capillary: 107 mg/dL — ABNORMAL HIGH (ref 70–99)
Glucose-Capillary: 133 mg/dL — ABNORMAL HIGH (ref 70–99)

## 2011-04-09 LAB — PROTIME-INR
INR: 1.82 — ABNORMAL HIGH (ref 0.00–1.49)
Prothrombin Time: 21.4 seconds — ABNORMAL HIGH (ref 11.6–15.2)

## 2011-04-09 MED ORDER — VANCOMYCIN HCL IN DEXTROSE 1-5 GM/200ML-% IV SOLN
1000.0000 mg | INTRAVENOUS | Status: DC
  Administered 2011-04-10 – 2011-04-12 (×3): 1000 mg via INTRAVENOUS
  Filled 2011-04-09 (×3): qty 200

## 2011-04-09 MED ORDER — DEXTROSE 50 % IV SOLN
25.0000 mL | Freq: Once | INTRAVENOUS | Status: AC | PRN
  Administered 2011-04-09: 25 mL via INTRAVENOUS

## 2011-04-09 MED ORDER — CIPROFLOXACIN IN D5W 400 MG/200ML IV SOLN
400.0000 mg | Freq: Two times a day (BID) | INTRAVENOUS | Status: DC
  Administered 2011-04-09 – 2011-04-11 (×6): 400 mg via INTRAVENOUS
  Filled 2011-04-09 (×9): qty 200

## 2011-04-09 MED ORDER — INSULIN ASPART 100 UNIT/ML ~~LOC~~ SOLN
10.0000 [IU] | Freq: Three times a day (TID) | SUBCUTANEOUS | Status: DC
  Administered 2011-04-10: 10 [IU] via SUBCUTANEOUS

## 2011-04-09 MED ORDER — DEXTROSE 50 % IV SOLN
INTRAVENOUS | Status: AC
  Administered 2011-04-09: 25 mL
  Filled 2011-04-09: qty 50

## 2011-04-09 MED ORDER — INSULIN GLARGINE 100 UNIT/ML ~~LOC~~ SOLN: 30.0000 [IU] | Freq: Every day | SUBCUTANEOUS | Status: DC

## 2011-04-09 MED ORDER — POTASSIUM CHLORIDE CRYS ER 20 MEQ PO TBCR
20.0000 meq | EXTENDED_RELEASE_TABLET | Freq: Three times a day (TID) | ORAL | Status: DC
  Administered 2011-04-09 – 2011-04-28 (×54): 20 meq via ORAL
  Filled 2011-04-09 (×65): qty 1

## 2011-04-09 MED ORDER — OXYCODONE-ACETAMINOPHEN 5-325 MG PO TABS
2.0000 | ORAL_TABLET | Freq: Four times a day (QID) | ORAL | Status: DC
  Administered 2011-04-09 – 2011-04-11 (×7): 2 via ORAL
  Filled 2011-04-09 (×7): qty 2

## 2011-04-09 MED ORDER — TECHNETIUM TC 99M TETROFOSMIN IV KIT
10.0000 | PACK | Freq: Once | INTRAVENOUS | Status: AC | PRN
  Administered 2011-04-09: 10 via INTRAVENOUS

## 2011-04-09 MED ORDER — TECHNETIUM TC 99M TETROFOSMIN IV KIT
30.0000 | PACK | Freq: Once | INTRAVENOUS | Status: AC | PRN
  Administered 2011-04-09: 30 via INTRAVENOUS

## 2011-04-09 MED ORDER — REGADENOSON 0.4 MG/5ML IV SOLN
INTRAVENOUS | Status: AC
  Administered 2011-04-09: 0.4 mg via INTRAVENOUS
  Filled 2011-04-09: qty 5

## 2011-04-09 MED ORDER — SODIUM CHLORIDE 0.9 % IJ SOLN
INTRAMUSCULAR | Status: AC
  Administered 2011-04-09: 10 mL via INTRAVENOUS
  Filled 2011-04-09: qty 10

## 2011-04-09 MED FILL — Hydromorphone HCl Inj 1 MG/ML: INTRAMUSCULAR | Qty: 1 | Status: AC

## 2011-04-09 NOTE — Progress Notes (Signed)
ANTIBIOTIC CONSULT NOTE   Pharmacy Consult for Vancomycin and Zosyn Indication: cellulitis of face vs osteomyelitis of foot  Allergies  Allergen Reactions  . Aspirin Other (See Comments)    Cannot take this medication due to kidney disease  . Ibuprofen Other (See Comments)    Kidney disease   Patient Measurements: Height: 5\' 1"  (154.9 cm) Weight: 149 lb 7.6 oz (67.8 kg) IBW/kg (Calculated) : 47.8   Vital Signs: Temp: 98.4 F (36.9 C) (03/22 1031) Temp src: Oral (03/22 1031) BP: 124/78 mmHg (03/22 1031) Pulse Rate: 85  (03/22 1031) Intake/Output from previous day: 03/21 0701 - 03/22 0700 In: 2670 [P.O.:720; I.V.:1500; IV Piggyback:450] Out: -  Intake/Output from this shift:    Labs:  Basename 04/09/11 0602 04/08/11 0920  WBC -- --  HGB -- --  PLT -- --  LABCREA -- --  CREATININE 1.34* 1.06   Estimated Creatinine Clearance: 48.7 ml/min (by C-G formula based on Cr of 1.34).  Basename 04/09/11 0928 04/07/11 0942  VANCOTROUGH 25.6* 24.3*  VANCOPEAK -- --  Jake Michaelis -- --  GENTTROUGH -- --  GENTPEAK -- --  GENTRANDOM -- --  TOBRATROUGH -- --  TOBRAPEAK -- --  TOBRARND -- --  AMIKACINPEAK -- --  AMIKACINTROU -- --  AMIKACIN -- --    Microbiology: Recent Results (from the past 720 hour(s))  MRSA PCR SCREENING     Status: Normal   Collection Time   03/28/11 12:00 AM      Component Value Range Status Comment   MRSA by PCR NEGATIVE  NEGATIVE  Final   URINE CULTURE     Status: Normal   Collection Time   03/29/11  7:30 PM      Component Value Range Status Comment   Specimen Description URINE, CLEAN CATCH   Final    Special Requests NONE   Final    Culture  Setup Time [id number]   Final    Colony Count NO GROWTH   Final    Culture NO GROWTH   Final    Report Status 03/30/2011 FINAL   Final   CULTURE, BLOOD (ROUTINE X 2)     Status: Normal   Collection Time   03/31/11 10:55 AM      Component Value Range Status Comment   Specimen Description BLOOD LEFT  HAND   Final    Special Requests BOTTLES DRAWN AEROBIC ONLY 4CC   Final    Culture NO GROWTH 5 DAYS   Final    Report Status 04/05/2011 FINAL   Final   MRSA PCR SCREENING     Status: Normal   Collection Time   04/05/11 10:42 PM      Component Value Range Status Comment   MRSA by PCR NEGATIVE  NEGATIVE  Final    Medical History: Past Medical History  Diagnosis Date  . Shingles     11/2010  . Cellulitis     of face - 12/2010  . Peripheral vascular disease in diabetes mellitus   . Hyperlipidemia   . Tobacco abuse     0.5 - 1ppd x 21 yrs  . Headache   . Renal disorder     glomerulonephritis  . Blood transfusion   . Anxiety   . Depression   . Peripheral vascular disease   . Diabetes mellitus     IDDM Since Age 72  . Embolism - blood clot    Medications:  Scheduled:     . amLODipine  2.5 mg Oral Daily  .  ciprofloxacin  400 mg Intravenous Q12H  . enoxaparin (LOVENOX) injection  1 mg/kg Subcutaneous Q12H  . furosemide  20 mg Oral 3 times weekly  . insulin aspart  0-9 Units Subcutaneous TID WC  . insulin aspart  10 Units Subcutaneous TID WC  . insulin glargine  30 Units Subcutaneous QHS  . mulitivitamin with minerals  1 tablet Oral Daily  . omega-3 acid ethyl esters  1 g Oral Daily  . oxyCODONE-acetaminophen  2 tablet Oral Q6H  . pantoprazole  40 mg Oral Q1200  . piperacillin-tazobactam (ZOSYN)  IV  3.375 g Intravenous Q8H  . potassium chloride SA  20 mEq Oral TID  . sertraline  50 mg Oral Daily  . simvastatin  20 mg Oral q1800  . vancomycin  1,000 mg Intravenous Q24H  . warfarin  3 mg Oral q1800  . DISCONTD: insulin aspart  0-5 Units Subcutaneous QHS  . DISCONTD: insulin aspart  8 Units Subcutaneous TID WC  . DISCONTD: insulin glargine  10 Units Subcutaneous QHS  . DISCONTD: insulin glargine  26 Units Subcutaneous QHS  . DISCONTD: insulin regular  10 Units Subcutaneous Once  . DISCONTD: potassium chloride SA  10 mEq Oral Daily  . DISCONTD: vancomycin  750 mg  Intravenous Q12H   Assessment: Trough still above goal despite dose reduction Very poor vancomycin clearance SCr rising now  Goal of Therapy:  Vancomycin trough level 15-20 mcg/ml  Plan: Decrease Vancomycin to 1gm iv q24hrs Zosyn 3.375 GM IV every 8 hours Monitor labs per protocol Re-check trough on Monday  [name] 04/09/2011,11:05 AM

## 2011-04-09 NOTE — Consult Note (Signed)
[name] for Lovenox MD managing Coumadin (3mg  daily currently)  Indication: clot in left foot  Allergies  Allergen Reactions  . Aspirin Other (See Comments)    Cannot take this medication due to kidney disease  . Ibuprofen Other (See Comments)    Kidney disease   Patient Measurements: Height: 5\' 1"  (154.9 cm) Weight: 149 lb 7.6 oz (67.8 kg) IBW/kg (Calculated) : 47.8   Vital Signs: Temp: 98.6 F (37 C) (03/22 0600) Temp src: Oral (03/22 0600) BP: 128/88 mmHg (03/22 0600) Pulse Rate: 112  (03/22 0600)  Labs:  [name] 04/09/11 0602 04/08/11 0920 04/08/11 0908 04/07/11 0519  HGB -- -- -- --  HCT -- -- -- --  PLT -- -- -- --  APTT -- -- -- --  LABPROT 21.4* -- 16.9* 15.2  INR 1.82* -- 1.35 1.18  HEPARINUNFRC -- -- -- --  CREATININE 1.34* 1.06 -- --  CKTOTAL -- -- -- --  CKMB -- -- -- --  TROPONINI -- -- -- --   Estimated Creatinine Clearance: 48.7 ml/min (by C-G formula based on Cr of 1.34).  Medical History: Past Medical History  Diagnosis Date  . Shingles     11/2010  . Cellulitis     of face - 12/2010  . Peripheral vascular disease in diabetes mellitus   . Hyperlipidemia   . Tobacco abuse     0.5 - 1ppd x 21 yrs  . Headache   . Renal disorder     glomerulonephritis  . Blood transfusion   . Anxiety   . Depression   . Peripheral vascular disease   . Diabetes mellitus     IDDM Since Age 27  . Embolism - blood clot    Medications:  Scheduled:     . amLODipine  2.5 mg Oral Daily  . enoxaparin (LOVENOX) injection  1 mg/kg Subcutaneous Q12H  . furosemide  20 mg Oral 3 times weekly  . insulin aspart  0-9 Units Subcutaneous TID WC  . insulin aspart  8 Units Subcutaneous TID WC  . insulin glargine  26 Units Subcutaneous QHS  . mulitivitamin with minerals  1 tablet Oral Daily  . omega-3 acid ethyl esters  1 g Oral Daily  . oxyCODONE-acetaminophen  2 tablet Oral Q6H  . pantoprazole  40 mg Oral Q1200  .  piperacillin-tazobactam (ZOSYN)  IV  3.375 g Intravenous Q8H  . potassium chloride SA  20 mEq Oral TID  . sertraline  50 mg Oral Daily  . simvastatin  20 mg Oral q1800  . vancomycin  750 mg Intravenous Q12H  . warfarin  3 mg Oral q1800  . DISCONTD: insulin aspart  0-5 Units Subcutaneous QHS  . DISCONTD: insulin glargine  10 Units Subcutaneous QHS  . DISCONTD: insulin regular  10 Units Subcutaneous Once  . DISCONTD: potassium chloride SA  10 mEq Oral Daily  . DISCONTD: Warfarin - Physician Dosing Inpatient   Does not apply q1800    Assessment: Good renal fxn but SCr rising MD is managing Warfarin, INR is trending up towards goal  Goal of Therapy:  Full anticoagulation with Lovenox until INR therapeutic INR 2-3 for Coumadin Rx   Plan: Lovenox 1mg /kg sq q12hrs Labs, CBC per protocol MD is managing Warfarin (3mg  daily)  [name] 04/09/2011,8:18 AM

## 2011-04-09 NOTE — Progress Notes (Signed)
Repot called and given to Janan Ridge, RN. Pt alert, oriented and in stable condition at the time of condition.

## 2011-04-09 NOTE — Progress Notes (Signed)
Subjective: She is awake and alert. She did receive a PICC line yesterday. She is having significant pain in her foot. I think this is a combination of relative ischemia of the foot. She does have a pulse in her foot but it is diminished. 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 She has probably some osteomyelitis/cellulitis. I discussed her situation with her she has now been in the hospital on 3 occasions in a brief period of time and only stayed home for a few days each time. I think she may require inpatient rehabilitation particularly since she's going to require 6 weeks of intravenous antibiotics.  Objective: Vital signs in last 24 hours: Temp:  [98 F (36.7 C)-98.6 F (37 C)] 98.6 F (37 C) (03/22 0600) Pulse Rate:  [111-126] 112  (03/22 0600) Resp:  [12-34] 22  (03/22 0600) BP: (103-160)/(42-94) 128/88 mmHg (03/22 0600) SpO2:  [93 %-99 %] 98 % (03/22 0600) Weight change:  Last BM Date: 04/07/11  Intake/Output from previous day: 03/21 0701 - 03/22 0700 In: 2670 [P.O.:720; I.V.:1500; IV Piggyback:450] Out: -   PHYSICAL EXAM General appearance: alert, cooperative and moderate distress Resp: clear to auscultation bilaterally Cardio: regular rate and rhythm, S1, S2 normal, no murmur, click, rub or gallop GI: soft, non-tender; bowel sounds normal; no masses,  no organomegaly Extremities: extremities normal, atraumatic, no cyanosis or edema  Lab Results:    Basic Metabolic Panel:  Basename 04/09/11 0602 04/08/11 0920  NA 136 136  K 3.0* 3.5  CL 101 101  CO2 29 25  GLUCOSE 352* 401*  BUN 15 13  CREATININE 1.34* 1.06  CALCIUM 7.6* 8.0*  MG -- --  PHOS -- --   Liver Function Tests: No results found for this basename: AST:2,ALT:2,ALKPHOS:2,BILITOT:2,PROT:2,ALBUMIN:2 in the last 72 hours No results found for this basename: LIPASE:2,AMYLASE:2 in the last 72 hours No results found for this basename: AMMONIA:2 in the last 72 hours CBC: No results found for this  basename: WBC:2,NEUTROABS:2,HGB:2,HCT:2,MCV:2,PLT:2 in the last 72 hours Cardiac Enzymes: No results found for this basename: CKTOTAL:3,CKMB:3,CKMBINDEX:3,TROPONINI:3 in the last 72 hours BNP: No results found for this basename: PROBNP:3 in the last 72 hours D-Dimer: No results found for this basename: DDIMER:2 in the last 72 hours CBG:  Basename 04/09/11 0724 04/08/11 2113 04/08/11 1631 04/08/11 1106 04/08/11 0921 04/07/11 2108  GLUCAP 302* 312* 108* 279* 377* 159*   Hemoglobin A1C: No results found for this basename: HGBA1C in the last 72 hours Fasting Lipid Panel: No results found for this basename: CHOL,HDL,LDLCALC,TRIG,CHOLHDL,LDLDIRECT in the last 72 hours Thyroid Function Tests: No results found for this basename: TSH,T4TOTAL,FREET4,T3FREE,THYROIDAB in the last 72 hours Anemia Panel: No results found for this basename: VITAMINB12,FOLATE,FERRITIN,TIBC,IRON,RETICCTPCT in the last 72 hours Coagulation:  Basename 04/09/11 0602 04/08/11 0908  LABPROT 21.4* 16.9*  INR 1.82* 1.35   Urine Drug Screen: Drugs of Abuse     Component Value Date/Time   LABOPIA NONE DETECTED 03/21/2010 2130   COCAINSCRNUR POSITIVE* 03/21/2010 2130   LABBENZ NONE DETECTED 03/21/2010 2130   AMPHETMU NONE DETECTED 03/21/2010 2130   THCU NONE DETECTED 03/21/2010 2130   LABBARB  Value: NONE DETECTED        DRUG SCREEN FOR MEDICAL PURPOSES ONLY.  IF CONFIRMATION IS NEEDED FOR ANY PURPOSE, NOTIFY LAB WITHIN 5 DAYS.        LOWEST DETECTABLE LIMITS FOR URINE DRUG SCREEN Drug Class       Cutoff (ng/mL) Amphetamine      1000 Barbiturate  200 Benzodiazepine   [id number] Tricyclics       [id number] Opiates          300 Cocaine          300 THC              50 03/21/2010 2130    Alcohol Level: No results found for this basename: ETH:2 in the last 72 hours Urinalysis: No results found for this basename:  COLORURINE:2,APPERANCEUR:2,LABSPEC:2,PHURINE:2,GLUCOSEU:2,HGBUR:2,BILIRUBINUR:2,KETONESUR:2,PROTEINUR:2,UROBILINOGEN:2,NITRITE:2,LEUKOCYTESUR:2 in the last 72 hours Misc. Labs:  ABGS No results found for this basename: PHART,PCO2,PO2ART,TCO2,HCO3 in the last 72 hours CULTURES Recent Results (from the past 240 hour(s))  CULTURE, BLOOD (ROUTINE X 2)     Status: Normal   Collection Time   03/31/11 10:55 AM      Component Value Range Status Comment   Specimen Description BLOOD LEFT HAND   Final    Special Requests BOTTLES DRAWN AEROBIC ONLY 4CC   Final    Culture NO GROWTH 5 DAYS   Final    Report Status 04/05/2011 FINAL   Final   MRSA PCR SCREENING     Status: Normal   Collection Time   04/05/11 10:42 PM      Component Value Range Status Comment   MRSA by PCR NEGATIVE  NEGATIVE  Final    Studies/Results: Ir US Guide Vasc Access Right  04/08/2011  *RADIOLOGY REPORT*  Clinical Data: Diabetic infected fOOT, access for antibiotics  PICC LINE PLACEMENT WITH ULTRASOUND AND FLUOROSCOPIC  GUIDANCE  Fluoroscopy Time: 0.1 minutes.  The right arm was prepped with chlorhexidine, draped in the usual sterile fashion using maximum barrier technique (cap and mask, sterile gown, sterile gloves, large sterile sheet, hand hygiene and cutaneous antisepsis) and infiltrated locally with 1% Lidocaine.  Ultrasound demonstrated patency of the right basilic vein, and this was documented with an image.  Under real-time ultrasound guidance, this vein was accessed with a 21 gauge micropuncture needle and image documentation was performed.  The needle was exchanged over a guidewire for a peel-away sheath through which a 5 [place] double lumen PICC trimmed to 24 cm was advanced, positioned with its tip at the lower SVC/right atrial junction.  Fluoroscopy during the procedure and fluoro spot radiograph confirms appropriate catheter position.  The catheter was flushed, secured to the skin with Prolene sutures, and covered with a  sterile dressing.  Complications:  No immediate  IMPRESSION: Successful right arm PICC line placement with ultrasound and fluoroscopic guidance.  The catheter is ready for use.  Original Report Authenticated By: Jerilynn Mages. Daryll Brod, M.D.   Ir Fluoro Guide Cv Midline Picc Right  04/08/2011  *RADIOLOGY REPORT*  Clinical Data: Diabetic infected fOOT, access for antibiotics  PICC LINE PLACEMENT WITH ULTRASOUND AND FLUOROSCOPIC  GUIDANCE  Fluoroscopy Time: 0.1 minutes.  The right arm was prepped with chlorhexidine, draped in the usual sterile fashion using maximum barrier technique (cap and mask, sterile gown, sterile gloves, large sterile sheet, hand hygiene and cutaneous antisepsis) and infiltrated locally with 1% Lidocaine.  Ultrasound demonstrated patency of the right basilic vein, and this was documented with an image.  Under real-time ultrasound guidance, this vein was accessed with a 21 gauge micropuncture needle and image documentation was performed.  The needle was exchanged over a guidewire for a peel-away sheath through which a 5 [place] double lumen PICC trimmed to 24 cm was advanced, positioned with its tip at the lower SVC/right atrial junction.  Fluoroscopy during the procedure and fluoro spot radiograph confirms appropriate catheter position.  The catheter was flushed, secured to the skin with Prolene sutures, and covered with a sterile dressing.  Complications:  No immediate  IMPRESSION: Successful right arm PICC line placement with ultrasound and fluoroscopic guidance.  The catheter is ready for use.  Original Report Authenticated By: Jerilynn Mages. Daryll Brod, M.D.    Medications:  Scheduled:   . amLODipine  2.5 mg Oral Daily  . enoxaparin (LOVENOX) injection  1 mg/kg Subcutaneous Q12H  . furosemide  20 mg Oral 3 times weekly  . insulin aspart  0-9 Units Subcutaneous TID WC  . insulin aspart  8 Units Subcutaneous TID WC  . insulin glargine  26 Units Subcutaneous QHS  . mulitivitamin with minerals  1  tablet Oral Daily  . omega-3 acid ethyl esters  1 g Oral Daily  . pantoprazole  40 mg Oral Q1200  . piperacillin-tazobactam (ZOSYN)  IV  3.375 g Intravenous Q8H  . potassium chloride SA  10 mEq Oral Daily  . sertraline  50 mg Oral Daily  . simvastatin  20 mg Oral q1800  . vancomycin  750 mg Intravenous Q12H  . warfarin  3 mg Oral q1800  . DISCONTD: insulin aspart  0-5 Units Subcutaneous QHS  . DISCONTD: insulin glargine  10 Units Subcutaneous QHS  . DISCONTD: insulin regular  10 Units Subcutaneous Once  . DISCONTD: Warfarin - Physician Dosing Inpatient   Does not apply q1800   Continuous:   . sodium chloride 75 mL/hr at 04/09/11 0600   [id number], ALPRAZolam, dextrose, dextrose, diphenhydrAMINE, HYDROmorphone (DILAUDID) injection, ondansetron, oxyCODONE-acetaminophen, DISCONTD: oxyCODONE-acetaminophen  Assesment: She has osteomyelitis of her foot. There also appears to be some cellulitis. She is about the same. Pain control is an issue. She is hypokalemic today. Her blood sugar has been doing fairly well. Active Problems:  * No active hospital problems. *     Plan: I'm going to transfer her from the step down unit start her on Cipro instead of Zosyn I would put her on some scheduled oxycodone and see if we can get her pain better controlled    LOS: 4 days   Alayah Knouff L 04/09/2011, 7:43 AM

## 2011-04-09 NOTE — Progress Notes (Signed)
   CARE MANAGEMENT NOTE 04/09/2011  Patient:  [name], [name]   Account Number:  [id number]  Date Initiated:  04/07/2011  Documentation initiated by:  Theophilus Kinds  Subjective/Objective Assessment:   Pt admitted from home with recurrent cellulitis. 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 Possible osteomylitis. Pt lives with mother and fiance. Pt is able to afford meds. Active with [facility].     Action/Plan:   CM spoke with pt about possible need for IV AB at discharge. Pain control is also an issue at this point.   Anticipated DC Date:  04/14/2011   Anticipated DC Plan:  [place]  In-house referral  Clinical Social Worker      DC Planning Services  CM consult      Choice offered to / List presented to:             [place].   Status of service:  In process, will continue to follow Medicare Important Message given?   (If response is "NO", the following Medicare IM given date fields will be blank) Date Medicare IM given:   Date Additional Medicare IM given:    Discharge Disposition:  [place]  Per UR Regulation:    If discussed at Long Length of Stay Meetings, dates discussed:    Comments:  04/09/11 [place], RN BSN CM Discussed the need for short term placement for IV AB therapy as recommended by Dr. Luan Pulling. Pt is agreeable. CSW made aware.  04/07/11 [place], RN BSN CM Pt admitted from home with cellulitis and possible osteomylitis. Pt already active with [facility]. Will resume services at discharge.

## 2011-04-09 NOTE — Progress Notes (Signed)
Referred to this CSW today for ?SNF. Chart reviewed and have spoken with patient who lives with her fiance, mother and daughter- she prefers to d/c home and says she has had [facility] for [facility] [place] services in past and is comfortable with doing IV ABX at home.  RNCM advised of this and patient declining of SNF. CSW to sign off- please contact us if SW needs arise. Eduard Clos, MSW, [place]

## 2011-04-09 NOTE — Progress Notes (Signed)
Stress Lab Nurses Notes - [place] [facility]  AIJA BOBADILLA 04/09/2011  Reason for doing test: Abn EKG Type of test: [place] Call Nurse performing test: Gerrit Halls, RN Nuclear Medicine Tech: Melburn Hake Echo Tech: Not Applicable MD performing test: T. Wall & [name] NP Family MD: Luan Pulling Test explained and consent signed: yes IV started: IV in progress from floor and PICC line in progress Symptoms: Felt "heart beating faster" Treatment/Intervention: None Reason test stopped: protocol completed After recovery IV was: Left intact (rate of 75) Patient to return to Nuc. Med at : 14:00 Patient discharged: Transported back to room 308 via wc Patient's Condition upon discharge was: stable Comments: Pain med given via Nurse from floor.  During test BP 118/58 & HR 114.  Recovery BP 110/60 & HR 102.  Symptoms resolved in recovery. Geanie Cooley T

## 2011-04-10 LAB — GLUCOSE, CAPILLARY
Glucose-Capillary: 80 mg/dL (ref 70–99)
Glucose-Capillary: 161 mg/dL — ABNORMAL HIGH (ref 70–99)
Glucose-Capillary: 43 mg/dL — CL (ref 70–99)
Glucose-Capillary: 84 mg/dL (ref 70–99)
Glucose-Capillary: 53 mg/dL — ABNORMAL LOW (ref 70–99)
Glucose-Capillary: 56 mg/dL — ABNORMAL LOW (ref 70–99)
Glucose-Capillary: 57 mg/dL — ABNORMAL LOW (ref 70–99)
Glucose-Capillary: 100 mg/dL — ABNORMAL HIGH (ref 70–99)

## 2011-04-10 LAB — CBC
HCT: 26.4 % — ABNORMAL LOW (ref 36.0–46.0)
Hemoglobin: 8.9 g/dL — ABNORMAL LOW (ref 12.0–15.0)
MCH: 30.6 pg (ref 26.0–34.0)
MCHC: 33.7 g/dL (ref 30.0–36.0)
MCV: 90.7 fL (ref 78.0–100.0)
Platelets: 567 10*3/uL — ABNORMAL HIGH (ref 150–400)
RBC: 2.91 MIL/uL — ABNORMAL LOW (ref 3.87–5.11)
RDW: 15.1 % (ref 11.5–15.5)
WBC: 15.3 10*3/uL — ABNORMAL HIGH (ref 4.0–10.5)

## 2011-04-10 LAB — PROTIME-INR
INR: 2.22 — ABNORMAL HIGH (ref 0.00–1.49)
Prothrombin Time: 25 seconds — ABNORMAL HIGH (ref 11.6–15.2)

## 2011-04-10 LAB — BASIC METABOLIC PANEL
BUN: 9 mg/dL (ref 6–23)
CO2: 29 mEq/L (ref 19–32)
Calcium: 7.7 mg/dL — ABNORMAL LOW (ref 8.4–10.5)
Chloride: 103 mEq/L (ref 96–112)
Creatinine, Ser: 1.14 mg/dL — ABNORMAL HIGH (ref 0.50–1.10)
GFR calc Af Amer: 68 mL/min — ABNORMAL LOW (ref >90)
GFR calc non Af Amer: 59 mL/min — ABNORMAL LOW (ref >90)
Glucose, Bld: 85 mg/dL (ref 70–99)
Potassium: 3 mEq/L — ABNORMAL LOW (ref 3.5–5.1)
Sodium: 139 mEq/L (ref 135–145)

## 2011-04-10 MED ORDER — SODIUM CHLORIDE 0.9 % IJ SOLN
INTRAMUSCULAR | Status: AC
  Filled 2011-04-10: qty 3

## 2011-04-10 MED ORDER — INSULIN GLARGINE 100 UNIT/ML ~~LOC~~ SOLN: 15.0000 [IU] | Freq: Every day | SUBCUTANEOUS | Status: DC

## 2011-04-10 NOTE — Consult Note (Signed)
[name] for Lovenox MD managing Coumadin (3mg  daily currently)  Indication: clot in left foot  Allergies  Allergen Reactions  . Aspirin Other (See Comments)    Cannot take this medication due to kidney disease  . Ibuprofen Other (See Comments)    Kidney disease   Patient Measurements: Height: 5\' 1"  (154.9 cm) Weight: 149 lb 7.6 oz (67.8 kg) IBW/kg (Calculated) : 47.8   Vital Signs: Temp: 98.3 F (36.8 C) (03/23 0649) Temp src: Oral (03/23 0649) BP: 136/84 mmHg (03/23 0649) Pulse Rate: 106  (03/23 0649)  Labs:  [name] 04/10/11 0640 04/09/11 0602 04/08/11 0920 04/08/11 0908  HGB 8.9* -- -- --  HCT 26.4* -- -- --  PLT 567* -- -- --  APTT -- -- -- --  LABPROT 25.0* 21.4* -- 16.9*  INR 2.22* 1.82* -- 1.35  HEPARINUNFRC -- -- -- --  CREATININE 1.14* 1.34* 1.06 --  CKTOTAL -- -- -- --  CKMB -- -- -- --  TROPONINI -- -- -- --   Estimated Creatinine Clearance: 57.2 ml/min (by C-G formula based on Cr of 1.14).  Medical History: Past Medical History  Diagnosis Date  . Shingles     11/2010  . Cellulitis     of face - 12/2010  . Peripheral vascular disease in diabetes mellitus   . Hyperlipidemia   . Tobacco abuse     0.5 - 1ppd x 21 yrs  . Headache   . Renal disorder     glomerulonephritis  . Blood transfusion   . Anxiety   . Depression   . Peripheral vascular disease   . Diabetes mellitus     IDDM Since Age 73  . Embolism - blood clot   . Renal insufficiency    Medications:  Scheduled:     . amLODipine  2.5 mg Oral Daily  . ciprofloxacin  400 mg Intravenous Q12H  . dextrose      . enoxaparin (LOVENOX) injection  1 mg/kg Subcutaneous Q12H  . furosemide  20 mg Oral 3 times weekly  . insulin aspart  0-9 Units Subcutaneous TID WC  . insulin aspart  10 Units Subcutaneous TID WC  . insulin glargine  30 Units Subcutaneous QHS  . mulitivitamin with minerals  1 tablet Oral Daily  . omega-3 acid ethyl esters  1 g Oral  Daily  . oxyCODONE-acetaminophen  2 tablet Oral Q6H  . pantoprazole  40 mg Oral Q1200  . piperacillin-tazobactam (ZOSYN)  IV  3.375 g Intravenous Q8H  . potassium chloride SA  20 mEq Oral TID  . regadenoson      . sertraline  50 mg Oral Daily  . simvastatin  20 mg Oral q1800  . sodium chloride      . sodium chloride      . vancomycin  1,000 mg Intravenous Q24H  . warfarin  3 mg Oral q1800  . DISCONTD: vancomycin  750 mg Intravenous Q12H    Assessment: Good renal fxn but SCr rising MD is managing Warfarin, INR therapeutic today   Goal of Therapy:  Full anticoagulation with Lovenox until INR therapeutic INR 2-3 for Coumadin Rx   Plan: Lovenox 1mg /kg sq q12hrs Labs, CBC per protocol MD is managing Warfarin (3mg  daily)  [name], [name] 04/10/2011,9:19 AM

## 2011-04-10 NOTE — Progress Notes (Signed)
[name], ZASLAVSKY                ACCOUNT NO.:  [id number]  MEDICAL RECORD NO.:  [id number]  LOCATION:  A308                          FACILITY:  [facility]  PHYSICIAN:  Tilman Mcclaren L. Luan Pulling, M.D.DATE OF BIRTH:  08-29-1970  DATE OF PROCEDURE: DATE OF DISCHARGE:                                PROGRESS NOTE   As what appears to be osteomyelitis in her foot.  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  She still has significant pain.  PHYSICAL EXAMINATION:  GENERAL:  She is a well-developed, well- nourished, in no acute distress.  She is afebrile. CHEST:  Relatively clear. HEART:  Regular. EXTREMITIES:  Her foot is cool, but does have a palpable pulse.  I think there is a little bit more demarcation of gangrene on the great toe.  ASSESSMENT:  Then is that, she has diabetes which is actually pretty well controlled.  She has what appears to be osteomyelitis of the foot. She has had amputation.  She is having significant problems with pain.  PLAN:  To continue with her current treatments.  She is on scheduled pain medication as well as p.r.n.  She is going to continue with everything else including the antibiotics.  I do not think she is going to be able to go directly home.     Janiaya Ryser L. Luan Pulling, M.D.     ELH/MEDQ  D:  04/10/2011  T:  04/10/2011  Job:  [id number]

## 2011-04-11 LAB — PROTIME-INR
INR: 2.75 — ABNORMAL HIGH (ref 0.00–1.49)
Prothrombin Time: 29.5 seconds — ABNORMAL HIGH (ref 11.6–15.2)

## 2011-04-11 LAB — BASIC METABOLIC PANEL
BUN: 8 mg/dL (ref 6–23)
CO2: 27 mEq/L (ref 19–32)
Calcium: 7.4 mg/dL — ABNORMAL LOW (ref 8.4–10.5)
Chloride: 105 mEq/L (ref 96–112)
Creatinine, Ser: 1.07 mg/dL (ref 0.50–1.10)
GFR calc Af Amer: 74 mL/min — ABNORMAL LOW (ref >90)
GFR calc non Af Amer: 64 mL/min — ABNORMAL LOW (ref >90)
Glucose, Bld: 179 mg/dL — ABNORMAL HIGH (ref 70–99)
Potassium: 3.7 mEq/L (ref 3.5–5.1)
Sodium: 137 mEq/L (ref 135–145)

## 2011-04-11 LAB — CBC
HCT: 25.1 % — ABNORMAL LOW (ref 36.0–46.0)
Hemoglobin: 8.3 g/dL — ABNORMAL LOW (ref 12.0–15.0)
MCH: 30.3 pg (ref 26.0–34.0)
MCHC: 33.1 g/dL (ref 30.0–36.0)
MCV: 91.6 fL (ref 78.0–100.0)
Platelets: 596 10*3/uL — ABNORMAL HIGH (ref 150–400)
RBC: 2.74 MIL/uL — ABNORMAL LOW (ref 3.87–5.11)
RDW: 15.1 % (ref 11.5–15.5)
WBC: 13.9 10*3/uL — ABNORMAL HIGH (ref 4.0–10.5)

## 2011-04-11 LAB — GLUCOSE, CAPILLARY
Glucose-Capillary: 186 mg/dL — ABNORMAL HIGH (ref 70–99)
Glucose-Capillary: 224 mg/dL — ABNORMAL HIGH (ref 70–99)
Glucose-Capillary: 88 mg/dL (ref 70–99)
Glucose-Capillary: 149 mg/dL — ABNORMAL HIGH (ref 70–99)

## 2011-04-11 MED ORDER — OXYCODONE HCL 5 MG PO TABS
15.0000 mg | ORAL_TABLET | Freq: Four times a day (QID) | ORAL | Status: DC
  Administered 2011-04-11 – 2011-04-19 (×33): 15 mg via ORAL
  Filled 2011-04-11 (×35): qty 3

## 2011-04-11 MED ORDER — PIPERACILLIN-TAZOBACTAM 3.375 G IVPB
INTRAVENOUS | Status: AC
  Filled 2011-04-11: qty 50

## 2011-04-11 MED ORDER — INSULIN ASPART 100 UNIT/ML ~~LOC~~ SOLN: 8.0000 [IU] | Freq: Three times a day (TID) | SUBCUTANEOUS | Status: DC

## 2011-04-11 NOTE — Progress Notes (Signed)
Patient ID: [name], female   DOB: 1970-07-18, 41 y.o.   MRN: [id number]

## 2011-04-11 NOTE — Consult Note (Signed)
[facility] for Lovenox MD managing Coumadin (3mg  daily currently)  Indication: clot in left foot  Allergies  Allergen Reactions  . Aspirin Other (See Comments)    Cannot take this medication due to kidney disease  . Ibuprofen Other (See Comments)    Kidney disease   Patient Measurements: Height: 5\' 1"  (154.9 cm) Weight: 149 lb 7.6 oz (67.8 kg) IBW/kg (Calculated) : 47.8   Vital Signs: Temp: 98.3 F (36.8 C) (03/24 0620) Temp src: Oral (03/24 0620) BP: 125/75 mmHg (03/24 0620) Pulse Rate: 110  (03/24 0620)  Labs:  Basename 04/11/11 1041 04/11/11 0650 04/10/11 0640 04/09/11 0602  HGB -- 8.3* 8.9* --  HCT -- 25.1* 26.4* --  PLT -- 596* 567* --  APTT -- -- -- --  LABPROT 29.5* -- 25.0* 21.4*  INR 2.75* -- 2.22* 1.82*  HEPARINUNFRC -- -- -- --  CREATININE -- 1.07 1.14* 1.34*  CKTOTAL -- -- -- --  CKMB -- -- -- --  TROPONINI -- -- -- --   Estimated Creatinine Clearance: 60.9 ml/min (by C-G formula based on Cr of 1.07).  Medical History: Past Medical History  Diagnosis Date  . Shingles     11/2010  . Cellulitis     of face - 12/2010  . Peripheral vascular disease in diabetes mellitus   . Hyperlipidemia   . Tobacco abuse     0.5 - 1ppd x 21 yrs  . Headache   . Renal disorder     glomerulonephritis  . Blood transfusion   . Anxiety   . Depression   . Peripheral vascular disease   . Diabetes mellitus     IDDM Since Age 61  . Embolism - blood clot   . Renal insufficiency    Medications:  Scheduled:     . amLODipine  2.5 mg Oral Daily  . ciprofloxacin  400 mg Intravenous Q12H  . enoxaparin (LOVENOX) injection  1 mg/kg Subcutaneous Q12H  . furosemide  20 mg Oral 3 times weekly  . insulin aspart  0-9 Units Subcutaneous TID WC  . insulin aspart  8 Units Subcutaneous TID WC  . insulin glargine  15 Units Subcutaneous QHS  . mulitivitamin with minerals  1 tablet Oral Daily  . omega-3 acid ethyl esters  1 g Oral Daily  .  oxyCODONE  15 mg Oral Q6H  . pantoprazole  40 mg Oral Q1200  . potassium chloride SA  20 mEq Oral TID  . sertraline  50 mg Oral Daily  . simvastatin  20 mg Oral q1800  . sodium chloride      . vancomycin  1,000 mg Intravenous Q24H  . warfarin  3 mg Oral q1800  . DISCONTD: insulin aspart  10 Units Subcutaneous TID WC  . DISCONTD: insulin glargine  30 Units Subcutaneous QHS  . DISCONTD: oxyCODONE-acetaminophen  2 tablet Oral Q6H  . DISCONTD: piperacillin-tazobactam (ZOSYN)  IV  3.375 g Intravenous Q8H    Assessment: Coumadin therapeutic for 2nd day Good renal fxn but SCr rising MD is managing Warfarin, INR therapeutic today   Goal of Therapy:  Full anticoagulation with Lovenox until INR therapeutic INR 2-3 for Coumadin Rx   Plan: Discontinue Lovenox 1mg /kg sq q12hrs since INR therapeutic Pharmacy to discontinue monitoring Lovenox MD is managing Warfarin (3mg  daily)  [name], [name] [place] 04/11/2011,12:52 PM

## 2011-04-11 NOTE — Progress Notes (Signed)
[name], [name]                ACCOUNT NO.:  [id number]  MEDICAL RECORD NO.:  [id number]  LOCATION:  A308                          FACILITY:  [facility]  PHYSICIAN:  [name], MD DATE OF BIRTH:  1970-02-14  DATE OF PROCEDURE:  04/11/2011 DATE OF DISCHARGE:                                PROGRESS NOTE   CHIEF COMPLAINT:  Followup for type 1 diabetes.  SUBJECTIVE:  The patient had mild hypoglycemia overnight due to inadequate food intake.  She had yesterday.  She was not on the floor for lunch and breakfast because she had to be off floor site for test. This morning, she complains of pain.  Her blood sugars are near normal at this point.  OBJECTIVE:  GENERAL:  She is sick looking, in pain. VITAL SIGNS:  Her vital signs, otherwise, are normal including blood pressure 111/72, pulse rate 110, temperature 97.9. HEENT:  Well hydrated. NECK:  Negative for JVD. CHEST:  Clear to auscultation bilaterally. CARDIOVASCULAR:  Normal S1 and S2.  No murmur.  No gallop. ABDOMEN:  Soft and nontender. EXTREMITIES:  She had left lower extremity in dressing, diagnosed with osteomyelitis.  Her labs show blood glucose with 100 mg per dL.  Her most recent chemistry shows sodium 139, potassium 3.0, chloride 103, bicarb 29, BUN 9, creatinine 1.14.  ASSESSMENT: 1. Type 1 diabetes, chronically uncontrolled. 2. Noncompliance to outpatient medical followup. 3. Left lower extremity osteomyelitis. 4. Severe peripheral arterial disease.  PLAN:  The patient had mild hypoglycemia yesterday due to interaction of her oral intake.  Her Lantus was decreased to 15 units at bedtime.  I will continue on the same.  I will also continue on NovoLog at 8 units t.i.d. a.c. plus sliding scale.  I will continue to titrate insulin based on blood glucose readings.          ______________________________ [name], MD     GN/MEDQ  D:  04/11/2011  T:  04/11/2011  Job:  [id number]

## 2011-04-11 NOTE — Progress Notes (Signed)
Subjective: She says she still having significant pain. She has no other new complaints. She is on antibiotic coverage for presumed osteomyelitis  Objective: Vital signs in last 24 hours: Temp:  [98.3 F (36.8 C)-98.4 F (36.9 C)] 98.3 F (36.8 C) (03/24 0620) Pulse Rate:  [108-112] 110  (03/24 0620) Resp:  [16-18] 16  (03/24 0620) BP: (115-141)/(70-77) 125/75 mmHg (03/24 0620) SpO2:  [94 %-97 %] 96 % (03/24 0620) Weight change:  Last BM Date: 04/11/11  Intake/Output from previous day: 03/23 0701 - 03/24 0700 In: 7236 [P.O.:720; I.V.:5166; IV Piggyback:1350] Out: 500 [Urine:500]  PHYSICAL EXAM General appearance: alert, cooperative and moderate distress Resp: clear to auscultation bilaterally Cardio: regular rate and rhythm, S1, S2 normal, no murmur, click, rub or gallop GI: soft, non-tender; bowel sounds normal; no masses,  no organomegaly Extremities: Her foot is cool she has had amputation of the fourth and fifth toes and has some gangrene on the great toe the pulse is palpable but markedly diminished  Lab Results:    Basic Metabolic Panel:  Basename 04/11/11 0650 04/10/11 0640  NA 137 139  K 3.7 3.0*  CL 105 103  CO2 27 29  GLUCOSE 179* 85  BUN 8 9  CREATININE 1.07 1.14*  CALCIUM 7.4* 7.7*  MG -- --  PHOS -- --   Liver Function Tests: No results found for this basename: AST:2,ALT:2,ALKPHOS:2,BILITOT:2,PROT:2,ALBUMIN:2 in the last 72 hours No results found for this basename: LIPASE:2,AMYLASE:2 in the last 72 hours No results found for this basename: AMMONIA:2 in the last 72 hours CBC:  Basename 04/11/11 0650 04/10/11 0640  WBC 13.9* 15.3*  NEUTROABS -- --  HGB 8.3* 8.9*  HCT 25.1* 26.4*  MCV 91.6 90.7  PLT 596* 567*   Cardiac Enzymes: No results found for this basename: CKTOTAL:3,CKMB:3,CKMBINDEX:3,TROPONINI:3 in the last 72 hours BNP: No results found for this basename: PROBNP:3 in the last 72 hours D-Dimer: No results found for this basename:  DDIMER:2 in the last 72 hours CBG:  Basename 04/11/11 0751 04/10/11 2224 04/10/11 2121 04/10/11 2052 04/10/11 2051 04/10/11 1758  GLUCAP 186* 100* 57* 56* 53* 84   Hemoglobin A1C: No results found for this basename: HGBA1C in the last 72 hours Fasting Lipid Panel: No results found for this basename: CHOL,HDL,LDLCALC,TRIG,CHOLHDL,LDLDIRECT in the last 72 hours Thyroid Function Tests: No results found for this basename: TSH,T4TOTAL,FREET4,T3FREE,THYROIDAB in the last 72 hours Anemia Panel: No results found for this basename: VITAMINB12,FOLATE,FERRITIN,TIBC,IRON,RETICCTPCT in the last 72 hours Coagulation:  Basename 04/10/11 0640 04/09/11 0602  LABPROT 25.0* 21.4*  INR 2.22* 1.82*   Urine Drug Screen: Drugs of Abuse     Component Value Date/Time   LABOPIA NONE DETECTED 03/21/2010 2130   COCAINSCRNUR POSITIVE* 03/21/2010 2130   LABBENZ NONE DETECTED 03/21/2010 2130   AMPHETMU NONE DETECTED 03/21/2010 2130   THCU NONE DETECTED 03/21/2010 2130   LABBARB  Value: NONE DETECTED        DRUG SCREEN FOR MEDICAL PURPOSES ONLY.  IF CONFIRMATION IS NEEDED FOR ANY PURPOSE, NOTIFY LAB WITHIN 5 DAYS.        LOWEST DETECTABLE LIMITS FOR URINE DRUG SCREEN Drug Class       Cutoff (ng/mL) Amphetamine      1000 Barbiturate      200 Benzodiazepine   [id number] Tricyclics       [id number] Opiates          300 Cocaine          300 THC  50 03/21/2010 2130    Alcohol Level: No results found for this basename: ETH:2 in the last 72 hours Urinalysis: No results found for this basename: COLORURINE:2,APPERANCEUR:2,LABSPEC:2,PHURINE:2,GLUCOSEU:2,HGBUR:2,BILIRUBINUR:2,KETONESUR:2,PROTEINUR:2,UROBILINOGEN:2,NITRITE:2,LEUKOCYTESUR:2 in the last 72 hours Misc. Labs:  ABGS No results found for this basename: PHART,PCO2,PO2ART,TCO2,HCO3 in the last 72 hours CULTURES Recent Results (from the past 240 hour(s))  MRSA PCR SCREENING     Status: Normal   Collection Time   04/05/11 10:42 PM      Component Value Range Status Comment     MRSA by PCR NEGATIVE  NEGATIVE  Final    Studies/Results: Nm Myocar Single W/spect W/wall Motion And Ef  04/09/2011  *RADIOLOGY REPORT*  Clinical Data:  Abnormal EKG, chest discomfort  MYOCARDIAL IMAGING WITH SPECT (REST AND PHARMACOLOGIC-STRESS) GATED LEFT VENTRICULAR WALL MOTION STUDY LEFT VENTRICULAR EJECTION FRACTION  Technique:  Standard myocardial SPECT imaging was performed after resting intravenous injection of 12/10 mCi Tc-40m Myoview. Subsequently, intravenous infusion of Lexiscan was performed under the supervision of the Cardiology staff.  At peak effect of the drug, 30 mCi Tc-71m Myoview was injected intravenously and standard myocardial SPECT imaging was performed.  Quantitative gated imaging was also performed to evaluate left ventricular wall motion and estimate left ventricular ejection fraction.  Comparison:  03/12/2011  Findings:  Spect:  Decreased activity of the anterior apical wall is more pronounced on the rest views than the stress more compatible with breast attenuation artifact.  No definite inducible or reversible ischemia with pharmacologic stress.  Wall motion:  Normal wall motion and thickening  Ejection fraction:  Calculated Q G S ejection fraction is 78%.  IMPRESSION: Breast attenuation of the anterior apical wall.  No definite inducible or reversible ischemia with pharmacologic stress  78% ejection fraction  Original Report Authenticated By: Jerilynn Mages. Daryll Brod, M.D.    Medications:  Prior to Admission:  Prescriptions prior to admission  Medication Sig Dispense Refill  . acetaminophen (TYLENOL) 500 MG tablet Take 1,000 mg by mouth every 6 (six) hours as needed. For pain       . ALPRAZolam (XANAX) 1 MG tablet Take 1 mg by mouth daily as needed. For anxiety      . amLODipine (NORVASC) 2.5 MG tablet Take 1 tablet (2.5 mg total) by mouth daily.  30 tablet  5  . cephALEXin (KEFLEX) 500 MG capsule Take 500 mg by mouth 3 (three) times daily.      . diphenhydrAMINE (BENADRYL)  25 MG tablet Take 25 mg by mouth at bedtime as needed. For sleep      . fish oil-omega-3 fatty acids 1000 MG capsule Take 1 g by mouth daily.      . furosemide (LASIX) 20 MG tablet Take 20 mg by mouth 3 (three) times a week. On Mondays, Wednesdays, and Fridays      . insulin aspart (NOVOLOG) 100 UNIT/ML injection Inject 5 Units into the skin 3 (three) times daily before meals.  1 vial  12  . insulin glargine (LANTUS) 100 UNIT/ML injection Inject 15 Units into the skin at bedtime.  10 mL  12  . Multiple Vitamins-Minerals (MULTIVITAMINS THER. W/MINERALS) TABS Take 1 tablet by mouth daily.        . ondansetron (ZOFRAN-ODT) 8 MG disintegrating tablet Take 8 mg by mouth every 12 (twelve) hours as needed. Nausea      . oxyCODONE-acetaminophen (PERCOCET) 10-325 MG per tablet Take 1 tablet by mouth every 6 (six) hours as needed for pain.  30 tablet  0  . potassium  chloride SA (K-DUR,KLOR-CON) 20 MEQ tablet Take 0.5 tablets (10 mEq total) by mouth daily.  7 tablet  0  . pravastatin (PRAVACHOL) 40 MG tablet Take 1 tablet (40 mg total) by mouth daily.  30 tablet  5  . sertraline (ZOLOFT) 50 MG tablet Take 50 mg by mouth daily.       Marland Kitchen warfarin (COUMADIN) 3 MG tablet Take 3 mg by mouth daily.       Scheduled:   . amLODipine  2.5 mg Oral Daily  . ciprofloxacin  400 mg Intravenous Q12H  . enoxaparin (LOVENOX) injection  1 mg/kg Subcutaneous Q12H  . furosemide  20 mg Oral 3 times weekly  . insulin aspart  0-9 Units Subcutaneous TID WC  . insulin aspart  8 Units Subcutaneous TID WC  . insulin glargine  15 Units Subcutaneous QHS  . mulitivitamin with minerals  1 tablet Oral Daily  . omega-3 acid ethyl esters  1 g Oral Daily  . oxyCODONE  15 mg Oral Q6H  . pantoprazole  40 mg Oral Q1200  . potassium chloride SA  20 mEq Oral TID  . sertraline  50 mg Oral Daily  . simvastatin  20 mg Oral q1800  . sodium chloride      . vancomycin  1,000 mg Intravenous Q24H  . warfarin  3 mg Oral q1800  . DISCONTD:  insulin aspart  10 Units Subcutaneous TID WC  . DISCONTD: insulin glargine  30 Units Subcutaneous QHS  . DISCONTD: oxyCODONE-acetaminophen  2 tablet Oral Q6H  . DISCONTD: piperacillin-tazobactam (ZOSYN)  IV  3.375 g Intravenous Q8H   Continuous:   . sodium chloride 75 mL/hr at 04/09/11 2135   [id number], ALPRAZolam, dextrose, dextrose, diphenhydrAMINE, HYDROmorphone (DILAUDID) injection, ondansetron  Assesment: She has osteomyelitis of the foot. She has peripheral vascular disease. She is diabetic I think part of the pain is from infection and part of it is from chronic ischemia Principal Problem:  *Osteomyelitis of foot, left, acute Active Problems:  Diabetes mellitus  Peripheral vascular disease in diabetes mellitus  Hyperlipidemia  Thromboembolism of lower extremity artery  Cellulitis of left foot    Plan: I changed Percocet to OxyContin without Tylenol 15 mg every 6 hours.    LOS: 6 days   Donnel Venuto L 04/11/2011, 10:22 AM

## 2011-04-12 ENCOUNTER — Encounter (HOSPITAL_COMMUNITY): Payer: Self-pay | Admitting: Radiology

## 2011-04-12 ENCOUNTER — Telehealth (HOSPITAL_COMMUNITY): Payer: Self-pay

## 2011-04-12 ENCOUNTER — Inpatient Hospital Stay (HOSPITAL_COMMUNITY): Payer: Medicaid Other

## 2011-04-12 LAB — GLUCOSE, CAPILLARY
Glucose-Capillary: 364 mg/dL — ABNORMAL HIGH (ref 70–99)
Glucose-Capillary: 231 mg/dL — ABNORMAL HIGH (ref 70–99)
Glucose-Capillary: 183 mg/dL — ABNORMAL HIGH (ref 70–99)
Glucose-Capillary: 59 mg/dL — ABNORMAL LOW (ref 70–99)
Glucose-Capillary: 63 mg/dL — ABNORMAL LOW (ref 70–99)
Glucose-Capillary: 147 mg/dL — ABNORMAL HIGH (ref 70–99)
Glucose-Capillary: 62 mg/dL — ABNORMAL LOW (ref 70–99)

## 2011-04-12 LAB — BASIC METABOLIC PANEL
BUN: 13 mg/dL (ref 6–23)
CO2: 17 mEq/L — ABNORMAL LOW (ref 19–32)
Calcium: 7.4 mg/dL — ABNORMAL LOW (ref 8.4–10.5)
Chloride: 101 mEq/L (ref 96–112)
Creatinine, Ser: 1.14 mg/dL — ABNORMAL HIGH (ref 0.50–1.10)
GFR calc Af Amer: 68 mL/min — ABNORMAL LOW (ref >90)
GFR calc non Af Amer: 59 mL/min — ABNORMAL LOW (ref >90)
Glucose, Bld: 347 mg/dL — ABNORMAL HIGH (ref 70–99)
Potassium: 4.6 mEq/L (ref 3.5–5.1)
Sodium: 134 mEq/L — ABNORMAL LOW (ref 135–145)

## 2011-04-12 LAB — CBC
HCT: 25.3 % — ABNORMAL LOW (ref 36.0–46.0)
Hemoglobin: 8.1 g/dL — ABNORMAL LOW (ref 12.0–15.0)
MCH: 30.1 pg (ref 26.0–34.0)
MCHC: 32 g/dL (ref 30.0–36.0)
MCV: 94.1 fL (ref 78.0–100.0)
Platelets: 675 10*3/uL — ABNORMAL HIGH (ref 150–400)
RBC: 2.69 MIL/uL — ABNORMAL LOW (ref 3.87–5.11)
RDW: 15.6 % — ABNORMAL HIGH (ref 11.5–15.5)
WBC: 15.7 10*3/uL — ABNORMAL HIGH (ref 4.0–10.5)

## 2011-04-12 LAB — VANCOMYCIN, TROUGH: Vancomycin Tr: 13.8 ug/mL (ref 10.0–20.0)

## 2011-04-12 LAB — PROTIME-INR
INR: 2.31 — ABNORMAL HIGH (ref 0.00–1.49)
Prothrombin Time: 25.8 seconds — ABNORMAL HIGH (ref 11.6–15.2)

## 2011-04-12 MED ORDER — SODIUM CHLORIDE 0.9 % IJ SOLN
INTRAMUSCULAR | Status: AC
  Administered 2011-04-12: 3 mL
  Filled 2011-04-12: qty 3

## 2011-04-12 MED ORDER — INSULIN ASPART 100 UNIT/ML ~~LOC~~ SOLN
10.0000 [IU] | Freq: Three times a day (TID) | SUBCUTANEOUS | Status: DC
  Administered 2011-04-12 – 2011-04-14 (×4): 10 [IU] via SUBCUTANEOUS

## 2011-04-12 MED ORDER — GLUCOSE 40 % PO GEL
1.0000 | ORAL | Status: DC | PRN
  Administered 2011-04-21 (×2): 37.5 g via ORAL
  Filled 2011-04-12 (×2): qty 1

## 2011-04-12 MED ORDER — DEXTROSE 50 % IV SOLN
INTRAVENOUS | Status: AC
  Filled 2011-04-12: qty 50

## 2011-04-12 MED ORDER — SODIUM CHLORIDE 0.9 % IJ SOLN
INTRAMUSCULAR | Status: AC
  Administered 2011-04-12: 20 mL
  Filled 2011-04-12: qty 6

## 2011-04-12 MED ORDER — CIPROFLOXACIN HCL 500 MG PO TABS
500.0000 mg | ORAL_TABLET | Freq: Two times a day (BID) | ORAL | Status: DC
  Administered 2011-04-12 – 2011-04-20 (×17): 500 mg via ORAL
  Filled 2011-04-12 (×3): qty 2
  Filled 2011-04-12: qty 1
  Filled 2011-04-12: qty 2
  Filled 2011-04-12: qty 1
  Filled 2011-04-12: qty 2
  Filled 2011-04-12: qty 1
  Filled 2011-04-12 (×2): qty 2
  Filled 2011-04-12 (×2): qty 1
  Filled 2011-04-12: qty 2
  Filled 2011-04-12: qty 1
  Filled 2011-04-12 (×2): qty 2
  Filled 2011-04-12 (×2): qty 1
  Filled 2011-04-12 (×4): qty 2

## 2011-04-12 MED ORDER — DEXTROSE 50 % IV SOLN
25.0000 mL | Freq: Once | INTRAVENOUS | Status: AC | PRN
  Administered 2011-04-12: 25 mL via INTRAVENOUS

## 2011-04-12 MED ORDER — SODIUM CHLORIDE 0.9 % IJ SOLN
INTRAMUSCULAR | Status: AC
  Administered 2011-04-12: 08:00:00
  Filled 2011-04-12: qty 6

## 2011-04-12 MED ORDER — INSULIN GLARGINE 100 UNIT/ML ~~LOC~~ SOLN: 20.0000 [IU] | Freq: Every day | SUBCUTANEOUS | Status: DC

## 2011-04-12 MED ORDER — SODIUM CHLORIDE 0.9 % IV SOLN
1250.0000 mg | INTRAVENOUS | Status: DC
  Administered 2011-04-13 – 2011-04-15 (×3): 1250 mg via INTRAVENOUS
  Filled 2011-04-12 (×4): qty 1250

## 2011-04-12 MED ORDER — HYDROMORPHONE HCL PF 1 MG/ML IJ SOLN
1.0000 mg | INTRAMUSCULAR | Status: DC | PRN
  Administered 2011-04-12 – 2011-04-13 (×6): 1 mg via INTRAVENOUS
  Filled 2011-04-12 (×6): qty 1

## 2011-04-12 NOTE — Progress Notes (Signed)
Patient ID: [name], female   DOB: [birth year], 41 y.o.   MRN: [id number]

## 2011-04-12 NOTE — Progress Notes (Signed)
Patient has been talking to herself all night and having conversations with people and pointing and people who are not in the room. I thought the patient was asleep or on the phone, however when i went in the room the phone was on the floor and the patient was awake fussing and pointing at someone. Patient seemed to think she is at home and arguing with her family particularly someone named Stanton Kidney.

## 2011-04-12 NOTE — Progress Notes (Signed)
04/12/11 1729 Notified Dr Luan Pulling of CT head results this evening once results back. Orders received for MRI brain without contrast. Order placed, nursing to monitor.

## 2011-04-12 NOTE — Progress Notes (Signed)
Subjective: She says she's doing okay. She has continued problems with pain but looks a little more comfortable after the change in medications.  Objective: Vital signs in last 24 hours: Temp:  [97.9 F (36.6 C)-98.3 F (36.8 C)] 98.1 F (36.7 C) (03/25 0454) Pulse Rate:  [95-121] 121  (03/25 0454) Resp:  [18-20] 20  (03/25 0454) BP: (85-111)/(65-72) 85/65 mmHg (03/25 0454) SpO2:  [92 %-99 %] 95 % (03/25 0454) Weight change:  Last BM Date: 04/11/11  Intake/Output from previous day: 03/24 0701 - 03/25 0700 In: 1590 [P.O.:640; I.V.:950] Out: 251 [Urine:251]  PHYSICAL EXAM General appearance: alert, cooperative and mild distress Resp: clear to auscultation bilaterally Cardio: regular rate and rhythm, S1, S2 normal, no murmur, click, rub or gallop GI: soft, non-tender; bowel sounds normal; no masses,  no organomegaly Extremities: Her foot is cool but with a palpable pulse. 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 and has some gangrene on the great toe and on the head of the third toe  Lab Results:    Basic Metabolic Panel:  Basename 04/12/11 0500 04/11/11 0650  NA 134* 137  K 4.6 3.7  CL 101 105  CO2 17* 27  GLUCOSE 347* 179*  BUN 13 8  CREATININE 1.14* 1.07  CALCIUM 7.4* 7.4*  MG -- --  PHOS -- --   Liver Function Tests: No results found for this basename: AST:2,ALT:2,ALKPHOS:2,BILITOT:2,PROT:2,ALBUMIN:2 in the last 72 hours No results found for this basename: LIPASE:2,AMYLASE:2 in the last 72 hours No results found for this basename: AMMONIA:2 in the last 72 hours CBC:  Basename 04/12/11 0500 04/11/11 0650  WBC 15.7* 13.9*  NEUTROABS -- --  HGB 8.1* 8.3*  HCT 25.3* 25.1*  MCV 94.1 91.6  PLT 675* 596*   Cardiac Enzymes: No results found for this basename: CKTOTAL:3,CKMB:3,CKMBINDEX:3,TROPONINI:3 in the last 72 hours BNP: No results found for this basename: PROBNP:3 in the last 72 hours D-Dimer: No results found for this basename: DDIMER:2 in the  last 72 hours CBG:  Basename 04/12/11 0719 04/11/11 2023 04/11/11 1621 04/11/11 1154 04/11/11 0751 04/10/11 2224  GLUCAP 364* 149* 88 224* 186* 100*   Hemoglobin A1C: No results found for this basename: HGBA1C in the last 72 hours Fasting Lipid Panel: No results found for this basename: CHOL,HDL,LDLCALC,TRIG,CHOLHDL,LDLDIRECT in the last 72 hours Thyroid Function Tests: No results found for this basename: TSH,T4TOTAL,FREET4,T3FREE,THYROIDAB in the last 72 hours Anemia Panel: No results found for this basename: VITAMINB12,FOLATE,FERRITIN,TIBC,IRON,RETICCTPCT in the last 72 hours Coagulation:  Basename 04/11/11 1041 04/10/11 0640  LABPROT 29.5* 25.0*  INR 2.75* 2.22*   Urine Drug Screen: Drugs of Abuse     Component Value Date/Time   LABOPIA NONE DETECTED 03/21/2010 2130   COCAINSCRNUR POSITIVE* 03/21/2010 2130   LABBENZ NONE DETECTED 03/21/2010 2130   AMPHETMU NONE DETECTED 03/21/2010 2130   THCU NONE DETECTED 03/21/2010 2130   LABBARB  Value: NONE DETECTED        DRUG SCREEN FOR MEDICAL PURPOSES ONLY.  IF CONFIRMATION IS NEEDED FOR ANY PURPOSE, NOTIFY LAB WITHIN 5 DAYS.        LOWEST DETECTABLE LIMITS FOR URINE DRUG SCREEN Drug Class       Cutoff (ng/mL) Amphetamine      1000 Barbiturate      200 Benzodiazepine   [id number] Tricyclics       [id number] Opiates          300 Cocaine          300 THC  50 03/21/2010 2130    Alcohol Level: No results found for this basename: ETH:2 in the last 72 hours Urinalysis: No results found for this basename: COLORURINE:2,APPERANCEUR:2,LABSPEC:2,PHURINE:2,GLUCOSEU:2,HGBUR:2,BILIRUBINUR:2,KETONESUR:2,PROTEINUR:2,UROBILINOGEN:2,NITRITE:2,LEUKOCYTESUR:2 in the last 72 hours Misc. Labs:  ABGS No results found for this basename: PHART,PCO2,PO2ART,TCO2,HCO3 in the last 72 hours CULTURES Recent Results (from the past 240 hour(s))  MRSA PCR SCREENING     Status: Normal   Collection Time   04/05/11 10:42 PM      Component Value Range Status Comment   MRSA by PCR  NEGATIVE  NEGATIVE  Final    Studies/Results: No results found.  Medications:  Prior to Admission:  Prescriptions prior to admission  Medication Sig Dispense Refill  . acetaminophen (TYLENOL) 500 MG tablet Take 1,000 mg by mouth every 6 (six) hours as needed. For pain       . ALPRAZolam (XANAX) 1 MG tablet Take 1 mg by mouth daily as needed. For anxiety      . amLODipine (NORVASC) 2.5 MG tablet Take 1 tablet (2.5 mg total) by mouth daily.  30 tablet  5  . cephALEXin (KEFLEX) 500 MG capsule Take 500 mg by mouth 3 (three) times daily.      . diphenhydrAMINE (BENADRYL) 25 MG tablet Take 25 mg by mouth at bedtime as needed. For sleep      . fish oil-omega-3 fatty acids 1000 MG capsule Take 1 g by mouth daily.      . furosemide (LASIX) 20 MG tablet Take 20 mg by mouth 3 (three) times a week. On Mondays, Wednesdays, and Fridays      . insulin aspart (NOVOLOG) 100 UNIT/ML injection Inject 5 Units into the skin 3 (three) times daily before meals.  1 vial  12  . insulin glargine (LANTUS) 100 UNIT/ML injection Inject 15 Units into the skin at bedtime.  10 mL  12  . Multiple Vitamins-Minerals (MULTIVITAMINS THER. W/MINERALS) TABS Take 1 tablet by mouth daily.        . ondansetron (ZOFRAN-ODT) 8 MG disintegrating tablet Take 8 mg by mouth every 12 (twelve) hours as needed. Nausea      . oxyCODONE-acetaminophen (PERCOCET) 10-325 MG per tablet Take 1 tablet by mouth every 6 (six) hours as needed for pain.  30 tablet  0  . potassium chloride SA (K-DUR,KLOR-CON) 20 MEQ tablet Take 0.5 tablets (10 mEq total) by mouth daily.  7 tablet  0  . pravastatin (PRAVACHOL) 40 MG tablet Take 1 tablet (40 mg total) by mouth daily.  30 tablet  5  . sertraline (ZOLOFT) 50 MG tablet Take 50 mg by mouth daily.       Marland Kitchen warfarin (COUMADIN) 3 MG tablet Take 3 mg by mouth daily.       Scheduled:   . amLODipine  2.5 mg Oral Daily  . ciprofloxacin  400 mg Intravenous Q12H  . furosemide  20 mg Oral 3 times weekly  . insulin  aspart  0-9 Units Subcutaneous TID WC  . insulin aspart  8 Units Subcutaneous TID WC  . insulin glargine  15 Units Subcutaneous QHS  . mulitivitamin with minerals  1 tablet Oral Daily  . omega-3 acid ethyl esters  1 g Oral Daily  . oxyCODONE  15 mg Oral Q6H  . pantoprazole  40 mg Oral Q1200  . potassium chloride SA  20 mEq Oral TID  . sertraline  50 mg Oral Daily  . simvastatin  20 mg Oral q1800  . sodium chloride      .  vancomycin  1,000 mg Intravenous Q24H  . warfarin  3 mg Oral q1800  . DISCONTD: enoxaparin (LOVENOX) injection  1 mg/kg Subcutaneous Q12H  . DISCONTD: insulin aspart  10 Units Subcutaneous TID WC  . DISCONTD: oxyCODONE-acetaminophen  2 tablet Oral Q6H  . DISCONTD: piperacillin-tazobactam (ZOSYN)  IV  3.375 g Intravenous Q8H   Continuous:   . sodium chloride 75 mL/hr at 04/11/11 1900   [id number], ALPRAZolam, dextrose, dextrose, diphenhydrAMINE, HYDROmorphone (DILAUDID) injection, ondansetron  Assesment: She has osteomyelitis of the foot. Her pain is a little bit better. Her diabetes is pretty well controlled. Principal Problem:  *Osteomyelitis of foot, left, acute Active Problems:  Diabetes mellitus  Peripheral vascular disease in diabetes mellitus  Hyperlipidemia  Thromboembolism of lower extremity artery  Cellulitis of left foot    Plan: I don't think that the best choice for her is to go home but she says that's what she wants to do. I don't think she's quite ready. I will plan to decrease the pain medication IV.    LOS: 7 days   Halie Gass L 04/12/2011, 8:22 AM

## 2011-04-12 NOTE — Progress Notes (Signed)
04/12/11 1342 Patient has had some confusion during night and today, also reported to have some hallucinations and talking to people during night while alone in room. Discussed with Dr Luan Pulling this am, stated possibly d/t dilaudid. Continued to have some confusion and talking to her grandmother today, patient's brother stated on visit this afternoon that their grandmother is deceased. Patient alert and oriented to self and place, reoriented as needed. Notified Dr Luan Pulling of status, orders received for CT brain. Nursing to monitor.

## 2011-04-12 NOTE — Progress Notes (Addendum)
Glycemic Control Recommendations    Patient was to receive Lantus 15 units last pm based on Dr. Liliane Channel orders and progress note.  Lantus held for "patient condition."  CBG this am 347 mg/dL.   Patient had hypoglycemic event on 3/23 due to poor intake due to procedure off unit, but has been consistently eating 75-85% of meal on 3/24.  Patient requires insulin for glucose control.  Will continue to monitor.

## 2011-04-12 NOTE — Progress Notes (Signed)
PHARMACIST - PHYSICIAN COMMUNICATION DR:   Luan Pulling CONCERNING: Antibiotic IV to Oral Route Change Policy  RECOMMENDATION: This patient is receiving Cipro by the intravenous route.  Based on criteria approved by the Pharmacy and Therapeutics Committee, the antibiotic(s) is/are being converted to the equivalent oral dose form(s).  DESCRIPTION: These criteria include:  Patient being treated for a respiratory tract infection, urinary tract infection, or cellulitis  The patient is not neutropenic and does not exhibit a GI malabsorption state  The patient is eating (either orally or via tube) and/or has been taking other orally administered medications for a least 24 hours  The patient is improving clinically and has a Tmax < 100.5  If you have questions about this conversion, please contact the Pharmacy Department  [x]   [phone] )  [facility] []   [phone] )  [facility]  []   [phone] )  [facility] []   [phone] )  [facility]   S. [place] Crane, PharmD

## 2011-04-12 NOTE — Progress Notes (Signed)
[name], [name]                ACCOUNT NO.:  [id number]  MEDICAL RECORD NO.:  [id number]  LOCATION:  [id number]                          FACILITY:  [facility]  PHYSICIAN:  [name], MD DATE OF BIRTH:  04-17-1970  DATE OF PROCEDURE:  04/12/2011 DATE OF DISCHARGE:                                PROGRESS NOTE   CHIEF COMPLAINT:  Followup for type 1 diabetes.  SUBJECTIVE:  The patient is sleepy today.  She states she eats very well.  No hypoglycemia.  OBJECTIVE:  GENERAL:  She is in deep sleep, however, arousable.  Not in acute distress.  Well hydrated. VITAL SIGNS:  Blood pressure 102/68, pulse rate 105, temperature 98.4. CHEST:  Clear to auscultation bilaterally. CARDIOVASCULAR:  Normal S1 and S2.  No murmur.  No gallop. ABDOMEN:  Soft and nontender. EXTREMITIES:  No edema.  The left lower extremity dressed. NEUROLOGIC:  Moves all extremities. SKIN:  Has no rash.  No hyperemia.  LABORATORY DATA:  Her glycemic range is between 200-300.  Her chemistry today is consistent with sodium 134, potassium 4.6, chloride 101, bicarb 17, BUN 13, creatinine 1.14.  ASSESSMENT: 1. Type 1 diabetes. 2. Noncompliance to outpatient medical followup. 3. Left lower extremity osteomyelitis. 4. Severe hyperglycemia. 5. Severe peripheral arterial disease.  PLAN:  The patient will tolerate more insulin.  I will increase her glargine to 20 units at bedtime and her NovoLog to 10 units t.i.d. a.c. plus sensitive dose sliding scale.  Her insulin dose will be readjusted based on blood glucose readings.         ______________________________ [name], MD    GN/MEDQ  D:  04/12/2011  T:  04/12/2011  Job:  [id number]

## 2011-04-12 NOTE — Progress Notes (Signed)
04/12/11 1831 INR 2.31 this evening, notified Dr Luan Pulling, stated okay to give coumadin 3 mg po this evening as ordered. Patient tearful and c/o left foot pain and phantom pain to left toes, stated okay to give dilaudid 1 mg IV as ordered PRN for pain. Notified Dr Luan Pulling that scheduled oxycodone held this afternoon d/t patient being drowsy/sleepy. Stated okay.

## 2011-04-12 NOTE — Progress Notes (Signed)
ANTIBIOTIC CONSULT NOTE   Pharmacy Consult for Vancomycin and Zosyn Indication: cellulitis of face vs osteomyelitis of foot  Allergies  Allergen Reactions  . Aspirin Other (See Comments)    Cannot take this medication due to kidney disease  . Ibuprofen Other (See Comments)    Kidney disease   Patient Measurements: Height: 5\' 1"  (154.9 cm) Weight: 149 lb 7.6 oz (67.8 kg) IBW/kg (Calculated) : 47.8   Vital Signs: Temp: 98.1 F (36.7 C) (03/25 0454) Temp src: Oral (03/25 0454) BP: 85/65 mmHg (03/25 0454) Pulse Rate: 121  (03/25 0454) Intake/Output from previous day: 03/24 0701 - 03/25 0700 In: 1590 [P.O.:640; I.V.:950] Out: 251 [Urine:251] Intake/Output from this shift: Total I/O In: 120 [P.O.:120] Out: -   Labs:  Basename 04/12/11 0500 04/11/11 0650 04/10/11 0640  WBC 15.7* 13.9* 15.3*  HGB 8.1* 8.3* 8.9*  PLT 675* 596* 567*  LABCREA -- -- --  CREATININE 1.14* 1.07 1.14*   Estimated Creatinine Clearance: 57.2 ml/min (by C-G formula based on Cr of 1.14).  Basename 04/12/11 0701  VANCOTROUGH 13.8  VANCOPEAK --  VANCORANDOM --  GENTTROUGH --  GENTPEAK --  GENTRANDOM --  TOBRATROUGH --  TOBRAPEAK --  TOBRARND --  AMIKACINPEAK --  AMIKACINTROU --  AMIKACIN --    Microbiology: Recent Results (from the past 720 hour(s))  MRSA PCR SCREENING     Status: Normal   Collection Time   03/28/11 12:00 AM      Component Value Range Status Comment   MRSA by PCR NEGATIVE  NEGATIVE  Final   URINE CULTURE     Status: Normal   Collection Time   03/29/11  7:30 PM      Component Value Range Status Comment   Specimen Description URINE, CLEAN CATCH   Final    Special Requests NONE   Final    Culture  Setup Time [id number]   Final    Colony Count NO GROWTH   Final    Culture NO GROWTH   Final    Report Status 03/30/2011 FINAL   Final   CULTURE, BLOOD (ROUTINE X 2)     Status: Normal   Collection Time   03/31/11 10:55 AM      Component Value Range Status Comment   Specimen Description BLOOD LEFT HAND   Final    Special Requests BOTTLES DRAWN AEROBIC ONLY 4CC   Final    Culture NO GROWTH 5 DAYS   Final    Report Status 04/05/2011 FINAL   Final   MRSA PCR SCREENING     Status: Normal   Collection Time   04/05/11 10:42 PM      Component Value Range Status Comment   MRSA by PCR NEGATIVE  NEGATIVE  Final    Medical History: Past Medical History  Diagnosis Date  . Shingles     11/2010  . Cellulitis     of face - 12/2010  . Peripheral vascular disease in diabetes mellitus   . Hyperlipidemia   . Tobacco abuse     0.5 - 1ppd x 21 yrs  . Headache   . Renal disorder     glomerulonephritis  . Blood transfusion   . Anxiety   . Depression   . Peripheral vascular disease   . Diabetes mellitus     IDDM Since Age 15  . Embolism - blood clot   . Renal insufficiency    Medications:  Scheduled:     . amLODipine  2.5 mg Oral  Daily  . ciprofloxacin  400 mg Intravenous Q12H  . furosemide  20 mg Oral 3 times weekly  . insulin aspart  0-9 Units Subcutaneous TID WC  . insulin aspart  8 Units Subcutaneous TID WC  . insulin glargine  15 Units Subcutaneous QHS  . mulitivitamin with minerals  1 tablet Oral Daily  . omega-3 acid ethyl esters  1 g Oral Daily  . oxyCODONE  15 mg Oral Q6H  . pantoprazole  40 mg Oral Q1200  . potassium chloride SA  20 mEq Oral TID  . sertraline  50 mg Oral Daily  . simvastatin  20 mg Oral q1800  . sodium chloride      . vancomycin  1,250 mg Intravenous Q24H  . warfarin  3 mg Oral q1800  . DISCONTD: enoxaparin (LOVENOX) injection  1 mg/kg Subcutaneous Q12H  . DISCONTD: vancomycin  1,000 mg Intravenous Q24H   Assessment: Trough slightly below desired range  Goal of Therapy:  Vancomycin trough level 15-20 mcg/ml  Plan: Increase Vancomycin to 1.25gm iv q24hrs Zosyn 3.375 GM IV every 8 hours Monitor labs per protocol  [name] 04/12/2011,10:34 AM

## 2011-04-13 DIAGNOSIS — Z8673 Personal history of transient ischemic attack (TIA), and cerebral infarction without residual deficits: Secondary | ICD-10-CM

## 2011-04-13 DIAGNOSIS — R410 Disorientation, unspecified: Secondary | ICD-10-CM

## 2011-04-13 LAB — GLUCOSE, CAPILLARY
Glucose-Capillary: 340 mg/dL — ABNORMAL HIGH (ref 70–99)
Glucose-Capillary: 398 mg/dL — ABNORMAL HIGH (ref 70–99)
Glucose-Capillary: 141 mg/dL — ABNORMAL HIGH (ref 70–99)
Glucose-Capillary: 36 mg/dL — CL (ref 70–99)
Glucose-Capillary: 32 mg/dL — CL (ref 70–99)
Glucose-Capillary: 33 mg/dL — CL (ref 70–99)
Glucose-Capillary: 86 mg/dL (ref 70–99)

## 2011-04-13 MED ORDER — DEXTROSE 50 % IV SOLN: 25.0000 mL | Freq: Once | INTRAVENOUS | Status: AC | PRN

## 2011-04-13 NOTE — Progress Notes (Signed)
Patient ID: [name], female   DOB: 1970-04-30, 41 y.o.   MRN: [id number]

## 2011-04-13 NOTE — Progress Notes (Signed)
NAMEJULEIGH, [name]                ACCOUNT NO.:  [id number]  MEDICAL RECORD NO.:  [id number]  LOCATION:  [id number]                          FACILITY:  [facility]  PHYSICIAN:  Niyanna Asch, MD DATE OF BIRTH:  1970/12/03  DATE OF PROCEDURE:  04/13/2011 DATE OF DISCHARGE:                                PROGRESS NOTE   SUBJECTIVE:  She still has hyperglycemia, which seems to be rebound from mild hypoglycemia last night, which required 1 dose of dextrose as well as skipping of Lantus.  She is still not eating very well.  Complains of pain.  OBJECTIVE:  GENERAL:  Not in acute distress. VITAL SIGNS:  Blood pressure is 109/69, pulse rate is 111, temperature is 98.5. HEENT:  Well hydrated. NECK:  Negative for JVD. CHEST:  Clear to auscultation bilaterally. CARDIOVASCULAR:  Normal S1 and S2.  No murmur.  No gallop. ABDOMEN:  Soft and nontender. EXTREMITIES:  No edema.  Left lower extremity, dressed.  LABORATORY DATA:  Blood glucose at bedtime was 62, was given 25 mL of dextrose and then she woke up with 340 and at lunch time it was 398.  ASSESSMENT: 1. Type 1 diabetes. 2. Osteomyelitis of the left lower extremity. 3. Severe hyperglycemia. 4. Peripheral vascular disease.  PLAN:  Events over the last 24 hours have been noted.  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  Image quality limited by motion.  The patient's glycemic control is not satisfactorily controlled.  I considered to readmit her to ICU for glucose stabilizer with IV insulin in the phase of systemic infection.  However, this will be delayed until we see her presupper blood glucose readings.  If presupper blood glucose readings are above 300, she will need to be transferred to ICU for initiation of IV insulin with close monitoring  of blood glucose protocol.          ______________________________ [name], MD     GN/MEDQ  D:  04/13/2011  T:  04/13/2011  Job:  [id number]

## 2011-04-13 NOTE — Progress Notes (Signed)
SUBJECTIVE:Confused, but responsive. Continuous complaints of left leg pain.  Denies headache. Level of alertness has improved, and confusion has decreased relative to the onset of her neurologic problems.   Brief HPI update:   We are asked to see this patient again in the setting of abnormal MRA revealing multiple emboli. We initially saw her last week for pre-operative evaluation prior to Left foot surgery. She was admitted with osteomyelitis. She has a history of thrombus in th infrarenal Ao and occlusions of the th AT, PT and peroneal arteries. This was treated with intra-arterial thrombolytics and thrombectomy for multiple leg vessels and patch angioplasty of the left popliteal artery. She also has a history of anteroseptal and lateral infarction. We completed a lexiscan myoview on 04/09/2011. Results "Breast attenuation of the anterior apical wall. No definite inducible or reversible ischemia with pharmacologic stress EF 78%." She was cleared for surgery. She has been on coumadin with therapeutic INR since 04/10/2011.       She became more confused over the weekend. It was initally thought to be related to multiple pain medications. However, a CT scan was completed on 04/12/2011. suggesting acute or subacute ischemia in the right ICA territory, affecting the right frontal lobe and right basal ganglia, without hemorrhage or mass lesion. MRA completed the same day showed ultiple areas of acute infarct involving the right cerebellum, right basal ganglia, right posterior temporal, right parietal, and right frontal lobes. These involve different vascular territories and are suggestive of arterial emboli. We are asked to see her again for evaluation and TEE for cardiac source of emboli.   Most recent CT scan of the brain was obtained in 2009 and was essentially normal.  MRI of the brain  performed in 2003 revealed no significant abnormalities.    Principal Problem:  *Osteomyelitis of foot, left, acute Active  Problems:  Diabetes mellitus  Peripheral vascular disease in diabetes mellitus  Hyperlipidemia  Thromboembolism of lower extremity artery  Cellulitis of left foot   LABS: Basic Metabolic Panel:  Basename 04/12/11 0500 04/11/11 0650  NA 134* 137  K 4.6 3.7  CL 101 105  CO2 17* 27  GLUCOSE 347* 179*  BUN 13 8  CREATININE 1.14* 1.07  CALCIUM 7.4* 7.4*  MG -- --  PHOS -- --   CBC:  Basename 04/12/11 0500 04/11/11 0650  WBC 15.7* 13.9*  NEUTROABS -- --  HGB 8.1* 8.3*  HCT 25.3* 25.1*  MCV 94.1 91.6  PLT 675* 596*    RADIOLOGY: Ct Head Wo Contrast  04/12/2011  *RADIOLOGY REPORT*  Clinical Data: Confusion with hallucinations.  History of diabetes.  CT HEAD WITHOUT CONTRAST  Technique:    IMPRESSION: Findings suggesting acute or subacute ischemia in the right ICA territory, affecting the right frontal lobe and right basal ganglia, without hemorrhage or mass lesion.  Consider MRI brain without contrast as well as MRA intracranial for further evaluation if there are no contraindications. .  Original Report Authenticated By: Staci Righter, M.D.   Mr [facility] Wo Contrast  04/12/2011  *RADIOLOGY REPORT*  Clinical Data:  Confusion, diabetes, abnormal CT head.  MRI HEAD WITHOUT CONTRAST MRA HEAD WITHOUT CONTRAST  Technique:  Multiplanar, multiecho pulse .  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  These involve different vascular territories and are suggestive of arterial emboli.  MRA HEAD  Findings: Image quality degraded by motion.  The patient moved midway  through the  the exam causing misregistration artifact.  Both vertebral arteries are patent to the basilar.  Left vertebral artery is patent.  PICA is patent bilaterally.  Basilar artery is patent.  Superior cerebellar and posterior cerebral arteries are patent bilaterally.  Limited visualization of the distal  posterior cerebral arteries due to artifact from motion.  Internal carotid artery is patent bilaterally.  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 Anterior and middle cerebral arteries are patent but poorly visualized due to motion.  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  IMPRESSION: Limited study due to motion.  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 Hypoplastic left A1 segment.  Intracranial circulation not well evaluated on this study.  Original Report Authenticated By: Truett Perna, M.D.     PHYSICAL EXAM BP 104/68  Pulse 118  Temp(Src) 99.3 F (37.4 C) (Oral)  Resp 20  Ht 5\' 1"  (1.549 m)  Wt 149 lb 7.6 oz (67.8 kg)  BMI 28.24 kg/m2  SpO2 100%  LMP 12/28/2010 General: Well developed, well nourished, complaining of leg pain. Confusion is noted. Head: Eyes PERRLA, No xanthomas.   Normal cephalic and atramatic  Lungs: Clear bilaterally to auscultation and percussion. Heart: HRRR S1 S2, Soft systolic murmur. .  Pulses are 2+ & equal in upper extremities.             No carotid bruit. [name].  No abdominal bruits. No femoral bruits. Abdomen: Bowel sounds are positive, abdomen soft and non-tender without masses or                  Hernia's noted. Msk:  Back normal, Diminished strength and tone for age. Extremities: Good pulse in right leg, unable to really palpate or evaluate left leg and foot as she is very sensitive to pain there.  Neuro: Alert and confused; responsive and answers questions appropriately; intermittently agitated and crying out. Psych:  Good affect. Confusion is noted. TELEMETRY: Reviewed telemetry pt in: NSR.  ASSESSMENT AND PLAN:  1. Abnormal MRA Suggestive of Multiple emboli: She has a history of thromboembolic occlusion the left lower extremities and had been on coumadin therapy. She was subtherapeutic last week but has become therapeutic over the last few  days. Dr.Sakeena Teall's initial consultation note states that "there is no evidence for patent foramen ovale or venous thromboembolism causing paradoxic embolization " I will discuss this with Dr. Lattie Haw and plan TEE once he has evaluated the patient.  Continue coumadin at this time until decisions are made.  2. CAD: She had recent lexiscan myoview on 04/09/2011 demonstrating no new areas of ischemia. Blood pressure is controlled. She is not on BB or aspirin at this time. Allergies to ASA are noted. No complaints of chest discomfort or shortness of breath.   Phill Myron. Purcell Nails NP [facility] 04/13/2011, 10:15 AM   Cardiology Attending Patient interviewed and examined. Discussed with [name], NP.  Above note annotated and modified based upon my findings.  Unfortunate development of encephalopathy with multiple small cerebral infarctions in addition to patient's numerous chronic and acute  medical issues.  Possible sources of embolization include the bilateral carotids, which appears unlikely, the thoracic aorta, the heart and a paradoxic event through a patent foramen ovale. Possible etiologies include bacterial endocarditis, aortic atheroma, intracardiac mass or thrombus, paradoxical embolization.   The clinical history does not greatly support any of these. TEE is certainly the most appropriate  test to initially evaluate these possibilities and will be performed in the morning.  [name], MD 04/13/2011, 6:17 PM

## 2011-04-13 NOTE — Progress Notes (Signed)
Subjective: She has been confused. She had CT and then the MRI yesterday and the MRI is suggested that she is having embolic strokes. My concern of course about that is since it looks like she may have osteomyelitis and she had previous embolic phenomenon into her leg as to whether perhaps she has something like a clot even in her heart or endocarditis.  Objective: Vital signs in last 24 hours: Temp:  [98.4 F (36.9 C)-99.3 F (37.4 C)] 99.3 F (37.4 C) (03/26 0637) Pulse Rate:  [105-118] 118  (03/26 0637) Resp:  [16-20] 20  (03/26 0637) BP: (98-124)/(62-79) 104/68 mmHg (03/26 0637) SpO2:  [98 %-100 %] 100 % (03/26 [id number]) Weight change:  Last BM Date: 04/11/11  Intake/Output from previous day: 03/25 0701 - 03/26 0700 In: 2543 [P.O.:840; I.V.:1503; IV Piggyback:200] Out: -   PHYSICAL EXAM General appearance: alert, cooperative, mild distress and Not confused now Resp: clear to auscultation bilaterally Cardio: regular rate and rhythm, S1, S2 normal, no murmur, click, rub or gallop GI: soft, non-tender; bowel sounds normal; no masses,  no organomegaly Extremities: Her left foot is cool but does have a barely palpable pulse. 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 and has some gangrenous changes in the great toe and the third of  Lab Results:    Basic Metabolic Panel:  Basename 04/12/11 0500 04/11/11 0650  NA 134* 137  K 4.6 3.7  CL 101 105  CO2 17* 27  GLUCOSE 347* 179*  BUN 13 8  CREATININE 1.14* 1.07  CALCIUM 7.4* 7.4*  MG -- --  PHOS -- --   Liver Function Tests: No results found for this basename: AST:2,ALT:2,ALKPHOS:2,BILITOT:2,PROT:2,ALBUMIN:2 in the last 72 hours No results found for this basename: LIPASE:2,AMYLASE:2 in the last 72 hours No results found for this basename: AMMONIA:2 in the last 72 hours CBC:  Basename 04/12/11 0500 04/11/11 0650  WBC 15.7* 13.9*  NEUTROABS -- --  HGB 8.1* 8.3*  HCT 25.3* 25.1*  MCV 94.1 91.6  PLT 675* 596*    Cardiac Enzymes: No results found for this basename: CKTOTAL:3,CKMB:3,CKMBINDEX:3,TROPONINI:3 in the last 72 hours BNP: No results found for this basename: PROBNP:3 in the last 72 hours D-Dimer: No results found for this basename: DDIMER:2 in the last 72 hours CBG:  Basename 04/13/11 0724 04/12/11 2224 04/12/11 2145 04/12/11 2125 04/12/11 2104 04/12/11 1622  GLUCAP 340* 147* 62* 59* 63* 183*   Hemoglobin A1C: No results found for this basename: HGBA1C in the last 72 hours Fasting Lipid Panel: No results found for this basename: CHOL,HDL,LDLCALC,TRIG,CHOLHDL,LDLDIRECT in the last 72 hours Thyroid Function Tests: No results found for this basename: TSH,T4TOTAL,FREET4,T3FREE,THYROIDAB in the last 72 hours Anemia Panel: No results found for this basename: VITAMINB12,FOLATE,FERRITIN,TIBC,IRON,RETICCTPCT in the last 72 hours Coagulation:  Basename 04/12/11 1640 04/11/11 1041  LABPROT 25.8* 29.5*  INR 2.31* 2.75*   Urine Drug Screen: Drugs of Abuse     Component Value Date/Time   LABOPIA NONE DETECTED 03/21/2010 2130   COCAINSCRNUR POSITIVE* 03/21/2010 2130   LABBENZ NONE DETECTED 03/21/2010 2130   AMPHETMU NONE DETECTED 03/21/2010 2130   THCU NONE DETECTED 03/21/2010 2130   LABBARB  Value: NONE DETECTED        DRUG SCREEN FOR MEDICAL PURPOSES ONLY.  IF CONFIRMATION IS NEEDED FOR ANY PURPOSE, NOTIFY LAB WITHIN 5 DAYS.        LOWEST DETECTABLE LIMITS FOR URINE DRUG SCREEN Drug Class       Cutoff (ng/mL) Amphetamine  1000 Barbiturate      200 Benzodiazepine   [id number] Tricyclics       [id number] Opiates          300 Cocaine          300 THC              50 03/21/2010 2130    Alcohol Level: No results found for this basename: ETH:2 in the last 72 hours Urinalysis: No results found for this basename: COLORURINE:2,APPERANCEUR:2,LABSPEC:2,PHURINE:2,GLUCOSEU:2,HGBUR:2,BILIRUBINUR:2,KETONESUR:2,PROTEINUR:2,UROBILINOGEN:2,NITRITE:2,LEUKOCYTESUR:2 in the last 72 hours Misc. Labs:  ABGS No results found for  this basename: PHART,PCO2,PO2ART,TCO2,HCO3 in the last 72 hours CULTURES Recent Results (from the past 240 hour(s))  MRSA PCR SCREENING     Status: Normal   Collection Time   04/05/11 10:42 PM      Component Value Range Status Comment   MRSA by PCR NEGATIVE  NEGATIVE  Final    Studies/Results: Ct Head Wo Contrast  04/12/2011  *RADIOLOGY REPORT*  Clinical Data: Confusion with hallucinations.  History of diabetes.  CT HEAD WITHOUT CONTRAST  Technique:  Contiguous axial images were obtained from the base of the skull through the vertex without contrast.  Comparison: 11/14/2007.  Findings: There is no acute hemorrhage, or mass lesion.  There is an asymmetric hypodensity affecting the right MCA/lenticulostriate territory involving the right head of caudate nucleus and adjacent white matter.  There is no hemorrhage or mass effect.  This could represent an acute or subacute infarct. Similar hypodensities affects the right anterior frontal and right medial frontal parasagittal cortex (arrows);  right internal carotid artery territory acute or subacute infarction not excluded. If there are no contraindications, consider MRI for further evaluation.  No hydrocephalus or extra-axial fluid.  Mild baseline atrophy similar to 2009 scan.  Mild chronic microvascular ischemic change. Intact calvarium.  Clear sinuses and mastoids.  Moderate vascular calcification.  IMPRESSION: Findings suggesting acute or subacute ischemia in the right ICA territory, affecting the right frontal lobe and right basal ganglia, without hemorrhage or mass lesion.  Consider MRI brain without contrast as well as MRA intracranial for further evaluation if there are no contraindications. .  Original Report Authenticated By: Staci Righter, M.D.   Mr [facility] Wo Contrast  04/12/2011  *RADIOLOGY REPORT*  Clinical Data:  Confusion, diabetes, abnormal CT head.  MRI HEAD WITHOUT CONTRAST MRA HEAD WITHOUT CONTRAST  Technique:  Multiplanar, multiecho  pulse sequences of the brain and surrounding structures were obtained without intravenous contrast. Angiographic images of the head were obtained using MRA technique without contrast.  Comparison:  CT head 04/12/2011  MRI HEAD  Findings:  Image quality degraded by motion.  There is motion on diffusion weighted imaging which was repeated.  Repeat imaging also has motion.  Despite motion, there is evidence of acute infarct in various vascular territories.  Small area acute infarct in the right inferior cerebellum.  Small area of acute infarct in the head of the caudate on the right and in the right putamen.  Small acute infarcts  in the right posterior temporal lobe and in the right parietal white matter.  Acute infarct also present in the right medial frontal lobe in the anterior cerebral artery territory.  No acute infarct on the left.  Ventricle size is normal.  Negative for hemorrhage or mass lesion. No significant chronic ischemic changes are present.  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  These involve  different vascular territories and are suggestive of arterial emboli.  MRA HEAD  Findings: Image quality degraded by motion.  The patient moved midway through the  the exam causing misregistration artifact.  Both vertebral arteries are patent to the basilar.  Left vertebral artery is patent.  PICA is patent bilaterally.  Basilar artery is patent.  Superior cerebellar and posterior cerebral arteries are patent bilaterally.  Limited visualization of the distal posterior cerebral arteries due to artifact from motion.  Internal carotid artery is patent bilaterally.  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 Anterior and middle cerebral arteries are patent but poorly visualized due to motion.  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   IMPRESSION: Limited study due to motion.  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 Hypoplastic left A1 segment.  Intracranial circulation not well evaluated on this study.  Original Report Authenticated By: Truett Perna, M.D.   Mr Brain Wo Contrast  04/12/2011  *RADIOLOGY REPORT*  Clinical Data:  Confusion, diabetes, abnormal CT head.  MRI HEAD WITHOUT CONTRAST MRA HEAD WITHOUT CONTRAST  Technique:  Multiplanar, multiecho pulse sequences of the brain and surrounding structures were obtained without intravenous contrast. Angiographic images of the head were obtained using MRA technique without contrast.  Comparison:  CT head 04/12/2011  MRI HEAD  Findings:  Image quality degraded by motion.  There is motion on diffusion weighted imaging which was repeated.  Repeat imaging also has motion.  Despite motion, there is evidence of acute infarct in various vascular territories.  Small area acute infarct in the right inferior cerebellum.  Small area of acute infarct in the head of the caudate on the right and in the right putamen.  Small acute infarcts  in the right posterior temporal lobe and in the right parietal white matter.  Acute infarct also present in the right medial frontal lobe in the anterior cerebral artery territory.  No acute infarct on the left.  Ventricle size is normal.  Negative for hemorrhage or mass lesion. No significant chronic ischemic changes are present.  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  These involve different vascular territories and are suggestive of arterial emboli.  MRA HEAD  Findings: Image quality degraded by motion.  The patient moved midway through the  the exam causing misregistration artifact.  Both vertebral arteries are patent to the basilar.  Left vertebral artery is patent.  PICA is patent  bilaterally.  Basilar artery is patent.  Superior cerebellar and posterior cerebral arteries are patent bilaterally.  Limited visualization of the distal posterior cerebral arteries due to artifact from motion.  Internal carotid artery is patent bilaterally.  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 Anterior and middle cerebral arteries are patent but poorly visualized due to motion.  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  IMPRESSION: Limited study due to motion.  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 Hypoplastic left A1 segment.  Intracranial circulation not well evaluated on this study.  Original Report Authenticated By: Truett Perna, M.D.    Medications:  Scheduled:   . amLODipine  2.5 mg Oral Daily  . ciprofloxacin  500 mg Oral BID  . dextrose      . furosemide  20 mg Oral 3 times weekly  . insulin aspart  0-9 Units  Subcutaneous TID WC  . insulin aspart  10 Units Subcutaneous TID WC  . insulin glargine  20 Units Subcutaneous QHS  . mulitivitamin with minerals  1 tablet Oral Daily  . omega-3 acid ethyl esters  1 g Oral Daily  . oxyCODONE  15 mg Oral Q6H  . pantoprazole  40 mg Oral Q1200  . potassium chloride SA  20 mEq Oral TID  . sertraline  50 mg Oral Daily  . simvastatin  20 mg Oral q1800  . sodium chloride      . sodium chloride      . vancomycin  1,250 mg Intravenous Q24H  . warfarin  3 mg Oral q1800  . DISCONTD: ciprofloxacin  400 mg Intravenous Q12H  . DISCONTD: insulin aspart  8 Units Subcutaneous TID WC  . DISCONTD: insulin glargine  15 Units Subcutaneous QHS  . DISCONTD: vancomycin  1,000 mg Intravenous Q24H   Continuous:   . 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 She  is not confused now. Principal Problem:  *Osteomyelitis of foot, left, acute Active Problems:  Diabetes mellitus  Peripheral vascular disease in diabetes mellitus  Hyperlipidemia  Thromboembolism of lower extremity artery  Cellulitis of left foot    Plan: I will last for cardiology consultation regarding potential transesophageal echocardiogram. I will also ask for neurology consultation    LOS: 8 days   Deneen Slager L 04/13/2011, 8:38 AM

## 2011-04-14 ENCOUNTER — Inpatient Hospital Stay (HOSPITAL_COMMUNITY): Payer: Medicaid Other

## 2011-04-14 DIAGNOSIS — I6340 Cerebral infarction due to embolism of unspecified cerebral artery: Secondary | ICD-10-CM

## 2011-04-14 DIAGNOSIS — R404 Transient alteration of awareness: Secondary | ICD-10-CM

## 2011-04-14 LAB — BASIC METABOLIC PANEL
BUN: 12 mg/dL (ref 6–23)
CO2: 23 mEq/L (ref 19–32)
Calcium: 7.9 mg/dL — ABNORMAL LOW (ref 8.4–10.5)
Chloride: 108 mEq/L (ref 96–112)
Creatinine, Ser: 1.35 mg/dL — ABNORMAL HIGH (ref 0.50–1.10)
GFR calc Af Amer: 56 mL/min — ABNORMAL LOW (ref >90)
GFR calc non Af Amer: 48 mL/min — ABNORMAL LOW (ref >90)
Glucose, Bld: 171 mg/dL — ABNORMAL HIGH (ref 70–99)
Potassium: 4.5 mEq/L (ref 3.5–5.1)
Sodium: 136 mEq/L (ref 135–145)

## 2011-04-14 LAB — GLUCOSE, CAPILLARY
Glucose-Capillary: 102 mg/dL — ABNORMAL HIGH (ref 70–99)
Glucose-Capillary: 105 mg/dL — ABNORMAL HIGH (ref 70–99)
Glucose-Capillary: 128 mg/dL — ABNORMAL HIGH (ref 70–99)
Glucose-Capillary: 186 mg/dL — ABNORMAL HIGH (ref 70–99)
Glucose-Capillary: 188 mg/dL — ABNORMAL HIGH (ref 70–99)
Glucose-Capillary: 334 mg/dL — ABNORMAL HIGH (ref 70–99)
Glucose-Capillary: 46 mg/dL — ABNORMAL LOW (ref 70–99)
Glucose-Capillary: 62 mg/dL — ABNORMAL LOW (ref 70–99)
Glucose-Capillary: 62 mg/dL — ABNORMAL LOW (ref 70–99)
Glucose-Capillary: 73 mg/dL (ref 70–99)

## 2011-04-14 LAB — VITAMIN B12: Vitamin B-12: 226 pg/mL (ref 211–911)

## 2011-04-14 LAB — TSH: TSH: 6.633 u[IU]/mL — ABNORMAL HIGH (ref 0.350–4.500)

## 2011-04-14 LAB — HOMOCYSTEINE: Homocysteine: 2.9 umol/L — ABNORMAL LOW (ref 4.0–15.4)

## 2011-04-14 LAB — RPR: RPR Ser Ql: NONREACTIVE

## 2011-04-14 MED ORDER — MORPHINE SULFATE 4 MG/ML IJ SOLN
4.0000 mg | INTRAMUSCULAR | Status: DC | PRN
  Administered 2011-04-14 – 2011-04-19 (×19): 4 mg via INTRAVENOUS
  Filled 2011-04-14 (×21): qty 1

## 2011-04-14 MED ORDER — HYDROMORPHONE HCL PF 1 MG/ML IJ SOLN
2.0000 mg | INTRAMUSCULAR | Status: DC | PRN
  Administered 2011-04-14: 2 mg via INTRAVENOUS
  Filled 2011-04-14 (×2): qty 2

## 2011-04-14 MED ORDER — DEXTROSE 50 % IV SOLN
INTRAVENOUS | Status: AC
  Filled 2011-04-14: qty 50

## 2011-04-14 MED ORDER — SODIUM CHLORIDE 0.9 % IJ SOLN
INTRAMUSCULAR | Status: AC
  Filled 2011-04-14: qty 3

## 2011-04-14 MED ORDER — INSULIN GLARGINE 100 UNIT/ML ~~LOC~~ SOLN
15.0000 [IU] | Freq: Every day | SUBCUTANEOUS | Status: DC
  Administered 2011-04-14 – 2011-04-15 (×2): 15 [IU] via SUBCUTANEOUS

## 2011-04-14 MED ORDER — HYDROMORPHONE HCL PF 1 MG/ML IJ SOLN
2.0000 mg | Freq: Once | INTRAMUSCULAR | Status: AC
  Administered 2011-04-14: 2 mg via INTRAVENOUS
  Filled 2011-04-14: qty 2

## 2011-04-14 MED ORDER — CLOPIDOGREL BISULFATE 75 MG PO TABS
75.0000 mg | ORAL_TABLET | Freq: Every day | ORAL | Status: DC
  Administered 2011-04-14 – 2011-04-23 (×9): 75 mg via ORAL
  Filled 2011-04-14 (×10): qty 1

## 2011-04-14 MED ORDER — INSULIN ASPART 100 UNIT/ML ~~LOC~~ SOLN: 8.0000 [IU] | Freq: Three times a day (TID) | SUBCUTANEOUS | Status: DC

## 2011-04-14 MED ORDER — INSULIN ASPART 100 UNIT/ML ~~LOC~~ SOLN: 5.0000 [IU] | Freq: Three times a day (TID) | SUBCUTANEOUS | Status: DC

## 2011-04-14 NOTE — Progress Notes (Signed)
Subjective:  Feeling better, head seems to be clear today. Complains of significant left foot pain.  Objective:  Vital Signs in the last 24 hours: Temp:  [97.9 F (36.6 C)-99.1 F (37.3 C)] 97.9 F (36.6 C) (03/27 0611) Pulse Rate:  [99-122] 99  (03/27 0611) Resp:  [20] 20  (03/27 0611) BP: (109-119)/(69-74) 119/74 mmHg (03/27 0611) SpO2:  [96 %-99 %] 96 % (03/27 0611)  Intake/Output from previous day: 03/26 0701 - 03/27 0700 In: 250 [IV Piggyback:250] Out: 300 [Urine:300] Intake/Output from this shift:    Physical Exam: NECK: Without JVD, HJR, or bruit LUNGS: Clear anterior, posterior, lateral HEART: Regular rate and rhythm, no murmur, gallop, rub, bruit, thrill, or heave EXTREMITIES: left foot cyanotic with significant ulcers, painful to touch Lab Results:  Basename 04/12/11 0500  WBC 15.7*  HGB 8.1*  PLT 675*    Basename 04/14/11 0544 04/12/11 0500  NA 136 134*  K 4.5 4.6  CL 108 101  CO2 23 17*  GLUCOSE 171* 347*  BUN 12 13  CREATININE 1.35* 1.14*   No results found for this basename: TROPONINI:2,CK,MB:2 in the last 72 hours Hepatic Function Panel No results found for this basename: PROT,ALBUMIN,AST,ALT,ALKPHOS,BILITOT,BILIDIR,IBILI in the last 72 hours No results found for this basename: CHOL in the last 72 hours No results found for this basename: PROTIME in the last 72 hours  Imaging:   Cardiac Studies:  Assessment/Plan:  ASSESSMENT AND PLAN:  1. Abnormal MRA Suggestive of Multiple emboli: She has a history of thromboembolic occlusion the left lower extremities and had been on coumadin therapy. She was subtherapeutic last week but has become therapeutic over the last few days. development of encephalopathy with multiple small cerebral infarctions in addition to patient's numerous chronic and acute  medical issues.  Possible sources of embolization include the bilateral carotids, which appears unlikely, the thoracic aorta, the heart and a paradoxic event  through a patent foramen ovale. Possible etiologies include bacterial endocarditis, aortic atheroma, intracardiac mass or thrombus, paradoxical embolization.   The clinical history does not greatly support any of these. TEE is certainly the most appropriate test to initially evaluate these possibilities.This could not be performed today because it was not scheduled with PACU. She will is scheduled for 10:00 tomorrow morning. 2. CAD: She had recent lexiscan myoview on 04/09/2011 demonstrating no new areas of ischemia. Blood pressure is controlled. She is not on BB or aspirin at this time. Allergies to ASA are noted. No complaints of chest discomfort or shortness of breath.   3. Osteomyolitis left foot: still significant pain.       Ermalinda Barrios 04/14/2011, 8:36 AM

## 2011-04-14 NOTE — Progress Notes (Signed)
CBG 102 after Dextrose50 IV 25ML given. Will cont to monitor.

## 2011-04-14 NOTE — Progress Notes (Addendum)
Subjective: She had more pain last night. She has become more confused again. I don't how much of this is from her pain medication and how much from the stroke. I'm going to switch her off of dialogue and because she does seem to get confused with that. Otherwise she's about the same. She has no other new complaints.  Objective: Vital signs in last 24 hours: Temp:  [97.9 F (36.6 C)-99.1 F (37.3 C)] 97.9 F (36.6 C) (03/27 0611) Pulse Rate:  [99-122] 99  (03/27 0611) Resp:  [20] 20  (03/27 0611) BP: (109-119)/(69-74) 119/74 mmHg (03/27 0611) SpO2:  [96 %-99 %] 96 % (03/27 [id number]) Weight change:  Last BM Date: 04/11/11  Intake/Output from previous day: 03/26 0701 - 03/27 0700 In: 250 [IV Piggyback:250] Out: 300 [Urine:300]  PHYSICAL EXAM General appearance: alert, mild distress and Confused Resp: clear to auscultation bilaterally Cardio: regular rate and rhythm, S1, S2 normal, no murmur, click, rub or gallop GI: soft, non-tender; bowel sounds normal; no masses,  no organomegaly Extremities: Her foot is cool he but does have a pulse. She continues to have necrotic changes as previously described  Lab Results:    Basic Metabolic Panel:  Basename 04/14/11 0544 04/12/11 0500  NA 136 134*  K 4.5 4.6  CL 108 101  CO2 23 17*  GLUCOSE 171* 347*  BUN 12 13  CREATININE 1.35* 1.14*  CALCIUM 7.9* 7.4*  MG -- --  PHOS -- --   Liver Function Tests: No results found for this basename: AST:2,ALT:2,ALKPHOS:2,BILITOT:2,PROT:2,ALBUMIN:2 in the last 72 hours No results found for this basename: LIPASE:2,AMYLASE:2 in the last 72 hours No results found for this basename: AMMONIA:2 in the last 72 hours CBC:  Basename 04/12/11 0500  WBC 15.7*  NEUTROABS --  HGB 8.1*  HCT 25.3*  MCV 94.1  PLT 675*   Cardiac Enzymes: No results found for this basename: CKTOTAL:3,CKMB:3,CKMBINDEX:3,TROPONINI:3 in the last 72 hours BNP: No results found for this basename: PROBNP:3 in the last 72  hours D-Dimer: No results found for this basename: DDIMER:2 in the last 72 hours CBG:  Basename 04/14/11 0734 04/14/11 0431 04/14/11 0053 04/14/11 0025 04/13/11 2319 04/13/11 2227  GLUCAP 186* 128* 105* 62* 73 86   Hemoglobin A1C: No results found for this basename: HGBA1C in the last 72 hours Fasting Lipid Panel: No results found for this basename: CHOL,HDL,LDLCALC,TRIG,CHOLHDL,LDLDIRECT in the last 72 hours Thyroid Function Tests: No results found for this basename: TSH,T4TOTAL,FREET4,T3FREE,THYROIDAB in the last 72 hours Anemia Panel: No results found for this basename: VITAMINB12,FOLATE,FERRITIN,TIBC,IRON,RETICCTPCT in the last 72 hours Coagulation:  Basename 04/12/11 1640 04/11/11 1041  LABPROT 25.8* 29.5*  INR 2.31* 2.75*   Urine Drug Screen: Drugs of Abuse     Component Value Date/Time   LABOPIA NONE DETECTED 03/21/2010 2130   COCAINSCRNUR POSITIVE* 03/21/2010 2130   LABBENZ NONE DETECTED 03/21/2010 2130   AMPHETMU NONE DETECTED 03/21/2010 2130   THCU NONE DETECTED 03/21/2010 2130   LABBARB  Value: NONE DETECTED        DRUG SCREEN FOR MEDICAL PURPOSES ONLY.  IF CONFIRMATION IS NEEDED FOR ANY PURPOSE, NOTIFY LAB WITHIN 5 DAYS.        LOWEST DETECTABLE LIMITS FOR URINE DRUG SCREEN Drug Class       Cutoff (ng/mL) Amphetamine      1000 Barbiturate      200 Benzodiazepine   [id number] Tricyclics       [id number] Opiates          300 Cocaine  300 THC              50 03/21/2010 2130    Alcohol Level: No results found for this basename: ETH:2 in the last 72 hours Urinalysis: No results found for this basename: COLORURINE:2,APPERANCEUR:2,LABSPEC:2,PHURINE:2,GLUCOSEU:2,HGBUR:2,BILIRUBINUR:2,KETONESUR:2,PROTEINUR:2,UROBILINOGEN:2,NITRITE:2,LEUKOCYTESUR:2 in the last 72 hours Misc. Labs:  ABGS No results found for this basename: PHART,PCO2,PO2ART,TCO2,HCO3 in the last 72 hours CULTURES Recent Results (from the past 240 hour(s))  MRSA PCR SCREENING     Status: Normal   Collection Time   04/05/11  10:42 PM      Component Value Range Status Comment   MRSA by PCR NEGATIVE  NEGATIVE  Final   CULTURE, BLOOD (ROUTINE X 2)     Status: Normal (Preliminary result)   Collection Time   04/13/11  6:34 PM      Component Value Range Status Comment   Specimen Description BLOOD LEFT WRIST   Final    Special Requests     Final    Value: BOTTLES DRAWN AEROBIC AND ANAEROBIC AEB [place] ANA 4CC   Culture PENDING   Incomplete    Report Status PENDING   Incomplete    Studies/Results: Ct Head Wo Contrast  04/12/2011  *RADIOLOGY REPORT*  Clinical Data: Confusion with hallucinations.  History of diabetes.  CT HEAD WITHOUT CONTRAST  Technique:  Contiguous axial images were obtained from the base of the skull through the vertex without contrast.  Comparison: 11/14/2007.  Findings: There is no acute hemorrhage, or mass lesion.  There is an asymmetric hypodensity affecting the right MCA/lenticulostriate territory involving the right head of caudate nucleus and adjacent white matter.  There is no hemorrhage or mass effect.  This could represent an acute or subacute infarct. Similar hypodensities affects the right anterior frontal and right medial frontal parasagittal cortex (arrows);  right internal carotid artery territory acute or subacute infarction not excluded. If there are no contraindications, consider MRI for further evaluation.  No hydrocephalus or extra-axial fluid.  Mild baseline atrophy similar to 2009 scan.  Mild chronic microvascular ischemic change. Intact calvarium.  Clear sinuses and mastoids.  Moderate vascular calcification.  IMPRESSION: Findings suggesting acute or subacute ischemia in the right ICA territory, affecting the right frontal lobe and right basal ganglia, without hemorrhage or mass lesion.  Consider MRI brain without contrast as well as MRA intracranial for further evaluation if there are no contraindications. .  Original Report Authenticated By: Staci Righter, M.D.   Mr [facility] Wo  Contrast  04/12/2011  *RADIOLOGY REPORT*  Clinical Data:  Confusion, diabetes, abnormal CT head.  MRI HEAD WITHOUT CONTRAST MRA HEAD WITHOUT CONTRAST  Technique:  Multiplanar, multiecho pulse sequences of the brain and surrounding structures were obtained without intravenous contrast. Angiographic images of the head were obtained using MRA technique without contrast.  Comparison:  CT head 04/12/2011  MRI HEAD  Findings:  Image quality degraded by motion.  There is motion on diffusion weighted imaging which was repeated.  Repeat imaging also has motion.  Despite motion, there is evidence of acute infarct in various vascular territories.  Small area acute infarct in the right inferior cerebellum.  Small area of acute infarct in the head of the caudate on the right and in the right putamen.  Small acute infarcts  in the right posterior temporal lobe and in the right parietal white matter.  Acute infarct also present in the right medial frontal lobe in the anterior cerebral artery territory.  No acute infarct on the left.  Ventricle  size is normal.  Negative for hemorrhage or mass lesion. No significant chronic ischemic changes are present.  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  These involve different vascular territories and are suggestive of arterial emboli.  MRA HEAD  Findings: Image quality degraded by motion.  The patient moved midway through the  the exam causing misregistration artifact.  Both vertebral arteries are patent to the basilar.  Left vertebral artery is patent.  PICA is patent bilaterally.  Basilar artery is patent.  Superior cerebellar and posterior cerebral arteries are patent bilaterally.  Limited visualization of the distal posterior cerebral arteries due to artifact from motion.  Internal carotid artery is patent bilaterally.  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 Anterior and middle cerebral arteries are patent but poorly visualized due to motion.  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  IMPRESSION: Limited study due to motion.  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 Hypoplastic left A1 segment.  Intracranial circulation not well evaluated on this study.  Original Report Authenticated By: Truett Perna, M.D.   Mr Brain Wo Contrast  04/12/2011  *RADIOLOGY REPORT*  Clinical Data:  Confusion, diabetes, abnormal CT head.  MRI HEAD WITHOUT CONTRAST MRA HEAD WITHOUT CONTRAST  Technique:  Multiplanar, multiecho pulse sequences of the brain and surrounding structures were obtained without intravenous contrast. Angiographic images of the head were obtained using MRA technique without contrast.  Comparison:  CT head 04/12/2011  MRI HEAD  Findings:  Image quality degraded by motion.  There is motion on diffusion weighted imaging which was repeated.  Repeat imaging also has motion.  Despite motion, there is evidence of acute infarct in various vascular territories.  Small area acute infarct in the right inferior cerebellum.  Small area of acute infarct in the head of the caudate on the right and in the right putamen.  Small acute infarcts  in the right posterior temporal lobe and in the right parietal white matter.  Acute infarct also present in the right medial frontal lobe in the anterior cerebral artery territory.  No acute infarct on the left.  Ventricle size is normal.  Negative for hemorrhage or mass lesion. No significant chronic ischemic changes are present.  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  These involve different vascular territories and are suggestive of arterial emboli.  MRA HEAD  Findings: Image quality degraded by motion.  The patient  moved midway through the  the exam causing misregistration artifact.  Both vertebral arteries are patent to the basilar.  Left vertebral artery is patent.  PICA is patent bilaterally.  Basilar artery is patent.  Superior cerebellar and posterior cerebral arteries are patent bilaterally.  Limited visualization of the distal posterior cerebral arteries due to artifact from motion.  Internal carotid artery is patent bilaterally.  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 Anterior and middle cerebral arteries are patent but poorly visualized due to motion.  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  IMPRESSION: Limited study due to motion.  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 Hypoplastic left A1 segment.  Intracranial circulation not well evaluated on this study.  Original Report Authenticated By: Truett Perna, M.D.    Medications:  Prior to Admission:  Prescriptions prior to admission  Medication Sig Dispense Refill  . acetaminophen (TYLENOL) 500 MG tablet Take 1,000 mg by mouth every 6 (six) hours as needed. For pain       . ALPRAZolam (XANAX) 1 MG tablet Take 1 mg by mouth daily as needed. For anxiety      . amLODipine (NORVASC) 2.5 MG tablet Take 1 tablet (2.5 mg total) by mouth daily.  30 tablet  5  . cephALEXin (KEFLEX) 500 MG capsule Take 500 mg by mouth 3 (three) times daily.      . diphenhydrAMINE (BENADRYL) 25 MG tablet Take 25 mg by mouth at bedtime as needed. For sleep      . fish oil-omega-3 fatty acids 1000 MG capsule Take 1 g by mouth daily.      . furosemide (LASIX) 20 MG tablet Take 20 mg by mouth 3 (three) times a week. On Mondays, Wednesdays, and Fridays      . insulin aspart (NOVOLOG) 100 UNIT/ML injection Inject 5 Units into the skin 3 (three) times daily before meals.  1 vial  12  . insulin glargine (LANTUS) 100 UNIT/ML injection Inject 15 Units into the skin at bedtime.  10 mL   12  . Multiple Vitamins-Minerals (MULTIVITAMINS THER. W/MINERALS) TABS Take 1 tablet by mouth daily.        . ondansetron (ZOFRAN-ODT) 8 MG disintegrating tablet Take 8 mg by mouth every 12 (twelve) hours as needed. Nausea      . oxyCODONE-acetaminophen (PERCOCET) 10-325 MG per tablet Take 1 tablet by mouth every 6 (six) hours as needed for pain.  30 tablet  0  . potassium chloride SA (K-DUR,KLOR-CON) 20 MEQ tablet Take 0.5 tablets (10 mEq total) by mouth daily.  7 tablet  0  . pravastatin (PRAVACHOL) 40 MG tablet Take 1 tablet (40 mg total) by mouth daily.  30 tablet  5  . sertraline (ZOLOFT) 50 MG tablet Take 50 mg by mouth daily.       Marland Kitchen warfarin (COUMADIN) 3 MG tablet Take 3 mg by mouth daily.       Scheduled:   . amLODipine  2.5 mg Oral Daily  . ciprofloxacin  500 mg Oral BID  . dextrose      . furosemide  20 mg Oral 3 times weekly  .  HYDROmorphone (DILAUDID) injection  2 mg Intravenous Once  . insulin aspart  0-9 Units Subcutaneous TID WC  . insulin aspart  10 Units Subcutaneous TID WC  . insulin glargine  20 Units Subcutaneous QHS  . mulitivitamin with minerals  1 tablet Oral Daily  . omega-3 acid ethyl esters  1 g Oral Daily  . oxyCODONE  15 mg Oral Q6H  . pantoprazole  40 mg Oral Q1200  . potassium chloride SA  20 mEq Oral TID  . sertraline  50 mg Oral Daily  . simvastatin  20 mg Oral q1800  . sodium chloride      . vancomycin  1,250 mg Intravenous Q24H  . warfarin  3 mg Oral q1800   Continuous:   . sodium chloride 75 mL/hr at 04/13/11 2339   [id number], ALPRAZolam, dextrose, dextrose, dextrose, dextrose, diphenhydrAMINE, HYDROmorphone (DILAUDID) injection, ondansetron, DISCONTD:  HYDROmorphone (DILAUDID) injection  Assesment: She has had a stroke. She has problems with her foot. She has diabetes Principal Problem:  *Osteomyelitis of foot, left, acute Active Problems:  Diabetes mellitus  Peripheral vascular disease in diabetes mellitus  Hyperlipidemia   Thromboembolism of  lower extremity artery  Cellulitis of left foot  Cerebral embolus with cerebral infarction  Delirium    Plan: She is set for TEE later today. I'm going to change her off of dilaudid    LOS: 9 days   Mateya Torti L 04/14/2011, 8:54 AM

## 2011-04-14 NOTE — Consult Note (Signed)
NAMEKAMELIA, DELUDE                ACCOUNT NO.:  [id number]  MEDICAL RECORD NO.:  [id number]  LOCATION:  A329                          FACILITY:  [facility]  PHYSICIAN:  Deona Novitski A. Merlene Laughter, M.D. DATE OF BIRTH:  23-May-1970  DATE OF CONSULTATION: DATE OF DISCHARGE:                                CONSULTATION   She is a 41 year old white female who has a longstanding history of diabetes mellitus.  She was diagnosed with insulin-dependent diabetes mellitus at the age 49 years' old and has been on insulin since then. She had other issue such as pyelonephritis.  The patient has been hospitalized a couple of times in the last several days, initially admitted with complications of osteomyelitis and what appears to be ischemic left foot due to peripheral vascular disease.  Prior to this consultation, the patient was recently admitted 2 days ago for hyperglycemia.  The patient was noted to be quite unresponsive and confused, and imaging was done, CT followed by MRI.  MRI showed multiple acute infarcts in different vascular distributions suggestive of cardioembolic or other embolic phenomena and hence this neurological consultation.  The patient is on chronic warfarin therapy.  Unclear how long she has been on this and why she is on it.  I suspect that it is possibly due to the ischemic phenomenon to the left lower extremity. Her INRs have been therapeutic.  She continues to be drowsy and sleeps throughout during the evaluation.  She reports that she is not able to sleep well at nighttime in the hospital and that is what she is drowsy. She complains of severe left lower extremity pain.  PHYSICAL EXAMINATION:  GENERAL:  Shows a pleasant lady who appears about 58-68 years older than her stated age.  She is spontaneously awake and is talking on the phone, but she is drowsy, dozed off a few times during the evaluation. NECK:  Supple. HEAD:  Normocephalic, atraumatic. ABDOMEN:  Soft. EXTREMITIES:   Significant black and blue ischemia looking left foot, especially involving the great toe in addition toes.  There is some swelling of the foot and ankle, actually bilaterally, somewhat more on the left side. NEUROLOGIC:  Mentation again spontaneously awake.  She knows she is in the hospital.  She is oriented to time.  Does follow commands throughout.  She again dozed off a few times during the evaluation. Speech is normal.  Cranial nerve evaluation shows the pupils are equal, round, reactive to light and accommodation.  Extraocular movements are full.  Facial muscle strength is symmetric.  Tongue is midline.  Uvula midline.  Shoulder shrug is normal.  Motor examination shows marked weakness of the left lower extremity.  Unclear if this is focal deficit from a neurological standpoint, but seems mostly due to severe pain. Weakness is graded as 1.  Slight movements of the foot and ankle, and also knee elicits severe pain and cry.  Right lower extremity shows some weakness, 4+.  Upper extremity shows normal tone, bulk, and strength. Reflexes interestingly are preserved to brisk throughout including the left knee.  Right plantar seemed to be upgoing.  Left not tested due to pain.  Coordination shows no dysmetria.  There  is no past-pointing.  No parkinsonism.  Sensation is symmetric bilaterally.  MRI is reviewed in person and shows multiple small acute infarcts on diffusion imaging involving the right temporal area, right cerebellum, right parietal area, and the head of the caudate nucleus on the right. FLAIR imaging also shows hyperintensity involving cortical-based area, front area, frontal lobes.  Again, it follows the cortical rim on that side.  ASSESSMENT:  Multiple hyperintense lesion seen on diffusion imaging suggestive of acute infarct in different vascular territory suggesting embolic phenomenon, either from the aortic arch or cardioembolic.  The interestingly has been maintained  on warfarin therapy while she had these events.  She does appear to be somewhat groggy, possibly multifactorial including strokes and other toxic metabolic processes.  RECOMMENDATIONS:  Carotid duplex Doppler.  She apparently has had a transesophageal echo pending.  We will also do other labs such as homocysteine level, RPR, and vitamin B12 level.  We will add Plavix. This should be done for short duration, probably a couple of months or so.  From my standpoint, she does not need to be on warfarin indefinitely.  However, we will see what the echo results show.  Thanks for this consultation.     Oaklee Esther A. Merlene Laughter, M.D.     KAD/MEDQ  D:  04/14/2011  T:  04/14/2011  Job:  [id number]

## 2011-04-14 NOTE — Consult Note (Signed)
Reason for Consult: Referring Physician:   [name] is an 41 y.o. female.  HPI:   Past Medical History  Diagnosis Date  . Shingles     11/2010  . Cellulitis     of face - 12/2010  . Peripheral vascular disease in diabetes mellitus   . Hyperlipidemia   . Tobacco abuse     0.5 - 1ppd x 21 yrs  . Headache   . Renal disorder     glomerulonephritis  . Blood transfusion   . Anxiety   . Depression   . Peripheral vascular disease   . Diabetes mellitus     IDDM Since Age 18  . Embolism - blood clot   . Renal insufficiency     Past Surgical History  Procedure Date  . Wrist surgery     left  . Embolectomy 01/29/2011    Procedure: EMBOLECTOMY;  Surgeon: Mal Misty, MD;  Location: [facility] OR;  Service: Vascular;  Laterality: Left;  Left Popliteal and Tibial Embolectomy with patch angioplasty  . Dilation and curettage of uterus   . Multiple tooth extractions   . 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 COPD Mother     alive - 36  . Hypertension Mother   . Diabetes Mother   . Stroke Mother   . Coronary artery disease Mother   . Diabetes Father     alive - 63  . Hypertension Father   . Coronary artery disease Father   . Anesthesia problems Neg Hx     Social History:  reports that she has been passively smoking Cigarettes.  She has a 21 pack-year smoking history. She has never used smokeless tobacco. She reports that she uses illicit drugs (Marijuana) about once per week. She reports that she does not drink alcohol.  Allergies:  Allergies  Allergen Reactions  . Aspirin Other (See Comments)    Cannot take this medication due to kidney disease  . Ibuprofen Other (See Comments)    Kidney disease    Medications:  Prior to Admission medications   Medication Sig  Start Date End Date Taking? Authorizing Provider  acetaminophen (TYLENOL) 500 MG tablet Take 1,000 mg by mouth every 6 (six) hours as needed. For pain    Yes Historical Provider, MD  ALPRAZolam Duanne Moron) 1 MG tablet Take 1 mg by mouth daily as needed. For anxiety   Yes Historical Provider, MD  amLODipine (NORVASC) 2.5 MG tablet Take 1 tablet (2.5 mg total) by mouth daily. 03/30/11 03/29/12 Yes Alonza Bogus, MD  cephALEXin (KEFLEX) 500 MG capsule Take 500 mg by mouth 3 (three) times daily.   Yes Historical Provider, MD  diphenhydrAMINE (BENADRYL) 25 MG tablet Take 25 mg by mouth at bedtime as needed. For sleep   Yes Historical Provider, MD  fish oil-omega-3 fatty acids 1000 MG capsule Take 1 g by mouth daily.   Yes Historical Provider, MD  furosemide (LASIX) 20 MG tablet Take 20 mg by mouth 3 (three) times a week. On Mondays, Wednesdays, and Fridays   Yes Historical Provider, MD  insulin aspart (NOVOLOG) 100 UNIT/ML injection Inject 5 Units into the skin 3 (three) times daily before meals. 02/14/11 02/14/12 Yes Verlee Monte, MD  insulin glargine (LANTUS) 100 UNIT/ML  injection Inject 15 Units into the skin at bedtime. 02/14/11 02/14/12 Yes Verlee Monte, MD  Multiple Vitamins-Minerals (MULTIVITAMINS THER. W/MINERALS) TABS Take 1 tablet by mouth daily.     Yes Historical Provider, MD  oxyCODONE-acetaminophen (PERCOCET) 10-325 MG per tablet Take 1 tablet by mouth every 6 (six) hours as needed for pain. 04/04/11 04/14/11 Yes [name], MD  potassium chloride SA (K-DUR,KLOR-CON) 20 MEQ tablet Take 0.5 tablets (10 mEq total) by mouth daily. 10/09/10 10/09/11 Yes Terry S. Olin Hauser, MD  pravastatin (PRAVACHOL) 40 MG tablet Take 1 tablet (40 mg total) by mouth daily. 03/30/11 03/29/12 Yes Alonza Bogus, MD  sertraline (ZOLOFT) 50 MG tablet Take 50 mg by mouth daily.    Yes Historical Provider, MD  warfarin (COUMADIN) 3 MG tablet Take 3 mg by mouth daily.   Yes Historical Provider, MD   Scheduled Meds:   .  amLODipine  2.5 mg Oral Daily  . ciprofloxacin  500 mg Oral BID  . dextrose      . furosemide  20 mg Oral 3 times weekly  .  HYDROmorphone (DILAUDID) injection  2 mg Intravenous Once  . insulin aspart  0-9 Units Subcutaneous TID WC  . insulin aspart  8 Units Subcutaneous TID WC  . insulin glargine  15 Units Subcutaneous QHS  . mulitivitamin with minerals  1 tablet Oral Daily  . omega-3 acid ethyl esters  1 g Oral Daily  . oxyCODONE  15 mg Oral Q6H  . pantoprazole  40 mg Oral Q1200  . potassium chloride SA  20 mEq Oral TID  . sertraline  50 mg Oral Daily  . simvastatin  20 mg Oral q1800  . sodium chloride      . vancomycin  1,250 mg Intravenous Q24H  . warfarin  3 mg Oral q1800  . DISCONTD: insulin aspart  10 Units Subcutaneous TID WC  . DISCONTD: insulin glargine  20 Units Subcutaneous QHS   Continuous Infusions:   . sodium chloride 75 mL/hr at 04/13/11 2339   PRN Meds:.acetaminophen, ALPRAZolam, dextrose, dextrose, dextrose, dextrose, diphenhydrAMINE, morphine injection, ondansetron, DISCONTD:  HYDROmorphone (DILAUDID) injection, DISCONTD:  HYDROmorphone (DILAUDID) injection   Results for orders placed during the hospital encounter of 04/05/11 (from the past 48 hour(s))  GLUCOSE, CAPILLARY     Status: Abnormal   Collection Time   04/12/11 11:07 AM      Component Value Range Comment   Glucose-Capillary 231 (*) 70 - 99 (mg/dL)    Comment 1 Notify RN     GLUCOSE, CAPILLARY     Status: Abnormal   Collection Time   04/12/11  4:22 PM      Component Value Range Comment   Glucose-Capillary 183 (*) 70 - 99 (mg/dL)    Comment 1 Notify RN      Comment 2 Documented in Chart     PROTIME-INR     Status: Abnormal   Collection Time   04/12/11  4:40 PM      Component Value Range Comment   Prothrombin Time 25.8 (*) 11.6 - 15.2 (seconds)    INR 2.31 (*) 0.00 - 1.49    GLUCOSE, CAPILLARY     Status: Abnormal   Collection Time   04/12/11  9:04 PM      Component Value Range Comment    Glucose-Capillary 63 (*) 70 - 99 (mg/dL)    Comment 1 Notify RN      Comment 2 Documented in Chart     GLUCOSE, CAPILLARY  Status: Abnormal   Collection Time   04/12/11  9:25 PM      Component Value Range Comment   Glucose-Capillary 59 (*) 70 - 99 (mg/dL)    Comment 1 Notify RN      Comment 2 Documented in Chart      Comment 3 Repeat Test     GLUCOSE, CAPILLARY     Status: Abnormal   Collection Time   04/12/11  9:45 PM      Component Value Range Comment   Glucose-Capillary 62 (*) 70 - 99 (mg/dL)   GLUCOSE, CAPILLARY     Status: Abnormal   Collection Time   04/12/11 10:24 PM      Component Value Range Comment   Glucose-Capillary 147 (*) 70 - 99 (mg/dL)    Comment 1 Notify RN      Comment 2 Documented in Chart     GLUCOSE, CAPILLARY     Status: Abnormal   Collection Time   04/13/11  7:24 AM      Component Value Range Comment   Glucose-Capillary 340 (*) 70 - 99 (mg/dL)    Comment 1 Notify RN     GLUCOSE, CAPILLARY     Status: Abnormal   Collection Time   04/13/11 10:58 AM      Component Value Range Comment   Glucose-Capillary 398 (*) 70 - 99 (mg/dL)    Comment 1 Notify RN     GLUCOSE, CAPILLARY     Status: Abnormal   Collection Time   04/13/11  4:24 PM      Component Value Range Comment   Glucose-Capillary 141 (*) 70 - 99 (mg/dL)    Comment 1 Notify RN     CULTURE, BLOOD (ROUTINE X 2)     Status: Normal (Preliminary result)   Collection Time   04/13/11  6:34 PM      Component Value Range Comment   Specimen Description BLOOD LEFT WRIST      Special Requests        Value: BOTTLES DRAWN AEROBIC AND ANAEROBIC AEB [place] ANA 4CC   Culture PENDING      Report Status PENDING     GLUCOSE, CAPILLARY     Status: Abnormal   Collection Time   04/13/11  9:09 PM      Component Value Range Comment   Glucose-Capillary 36 (*) 70 - 99 (mg/dL)    Comment 1 Documented in Chart      Comment 2 Notify RN     GLUCOSE, CAPILLARY     Status: Abnormal   Collection Time   04/13/11  9:37 PM       Component Value Range Comment   Glucose-Capillary 33 (*) 70 - 99 (mg/dL)   GLUCOSE, CAPILLARY     Status: Abnormal   Collection Time   04/13/11 10:01 PM      Component Value Range Comment   Glucose-Capillary 32 (*) 70 - 99 (mg/dL)    Comment 1 Documented in Chart      Comment 2 Notify RN     GLUCOSE, CAPILLARY     Status: Normal   Collection Time   04/13/11 10:27 PM      Component Value Range Comment   Glucose-Capillary 86  70 - 99 (mg/dL)    Comment 1 Documented in Chart      Comment 2 Notify RN     GLUCOSE, CAPILLARY     Status: Normal   Collection Time   04/13/11 11:19 PM  Component Value Range Comment   Glucose-Capillary 73  70 - 99 (mg/dL)    Comment 1 Documented in Chart      Comment 2 Notify RN     GLUCOSE, CAPILLARY     Status: Abnormal   Collection Time   04/14/11 12:25 AM      Component Value Range Comment   Glucose-Capillary 62 (*) 70 - 99 (mg/dL)    Comment 1 Documented in Chart      Comment 2 Notify RN     GLUCOSE, CAPILLARY     Status: Abnormal   Collection Time   04/14/11 12:53 AM      Component Value Range Comment   Glucose-Capillary 105 (*) 70 - 99 (mg/dL)    Comment 1 Documented in Chart      Comment 2 Notify RN     GLUCOSE, CAPILLARY     Status: Abnormal   Collection Time   04/14/11  4:31 AM      Component Value Range Comment   Glucose-Capillary 128 (*) 70 - 99 (mg/dL)    Comment 1 Documented in Chart      Comment 2 Notify RN     BASIC METABOLIC PANEL     Status: Abnormal   Collection Time   04/14/11  5:44 AM      Component Value Range Comment   Sodium 136  135 - 145 (mEq/L)    Potassium 4.5  3.5 - 5.1 (mEq/L)    Chloride 108  96 - 112 (mEq/L)    CO2 23  19 - 32 (mEq/L)    Glucose, Bld 171 (*) 70 - 99 (mg/dL)    BUN 12  6 - 23 (mg/dL)    Creatinine, Ser 1.35 (*) 0.50 - 1.10 (mg/dL)    Calcium 7.9 (*) 8.4 - 10.5 (mg/dL)    GFR calc non Af Amer 48 (*) >90 (mL/min)    GFR calc Af Amer 56 (*) >90 (mL/min)   GLUCOSE, CAPILLARY     Status: Abnormal     Collection Time   04/14/11  7:34 AM      Component Value Range Comment   Glucose-Capillary 186 (*) 70 - 99 (mg/dL)    Comment 1 Notify RN       Ct Head Wo Contrast  04/12/2011  *RADIOLOGY REPORT*  Clinical Data: Confusion with hallucinations.  History of diabetes.  CT HEAD WITHOUT CONTRAST  Technique:  Contiguous axial images were obtained from the base of the skull through the vertex without contrast.  Comparison: 11/14/2007.  Findings: There is no acute hemorrhage, or mass lesion.  There is an asymmetric hypodensity affecting the right MCA/lenticulostriate territory involving the right head of caudate nucleus and adjacent white matter.  There is no hemorrhage or mass effect.  This could represent an acute or subacute infarct. Similar hypodensities affects the right anterior frontal and right medial frontal parasagittal cortex (arrows);  right internal carotid artery territory acute or subacute infarction not excluded. If there are no contraindications, consider MRI for further evaluation.  No hydrocephalus or extra-axial fluid.  Mild baseline atrophy similar to 2009 scan.  Mild chronic microvascular ischemic change. Intact calvarium.  Clear sinuses and mastoids.  Moderate vascular calcification.  IMPRESSION: Findings suggesting acute or subacute ischemia in the right ICA territory, affecting the right frontal lobe and right basal ganglia, without hemorrhage or mass lesion.  Consider MRI brain without contrast as well as MRA intracranial for further evaluation if there are no contraindications. .  Original Report  Authenticated By: Staci Righter, M.D.   Mr [facility] Wo Contrast  04/12/2011  *RADIOLOGY REPORT*  Clinical Data:  Confusion, diabetes, abnormal CT head.  MRI HEAD WITHOUT CONTRAST MRA HEAD WITHOUT CONTRAST  Technique:  Multiplanar, multiecho pulse sequences of the brain and surrounding structures were obtained without intravenous contrast. Angiographic images of the head were obtained using  MRA technique without contrast.  Comparison:  CT head 04/12/2011  MRI HEAD  Findings:  Image quality degraded by motion.  There is motion on diffusion weighted imaging which was repeated.  Repeat imaging also has motion.  Despite motion, there is evidence of acute infarct in various vascular territories.  Small area acute infarct in the right inferior cerebellum.  Small area of acute infarct in the head of the caudate on the right and in the right putamen.  Small acute infarcts  in the right posterior temporal lobe and in the right parietal white matter.  Acute infarct also present in the right medial frontal lobe in the anterior cerebral artery territory.  No acute infarct on the left.  Ventricle size is normal.  Negative for hemorrhage or mass lesion. No significant chronic ischemic changes are present.  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  These involve different vascular territories and are suggestive of arterial emboli.  MRA HEAD  Findings: Image quality degraded by motion.  The patient moved midway through the  the exam causing misregistration artifact.  Both vertebral arteries are patent to the basilar.  Left vertebral artery is patent.  PICA is patent bilaterally.  Basilar artery is patent.  Superior cerebellar and posterior cerebral arteries are patent bilaterally.  Limited visualization of the distal posterior cerebral arteries due to artifact from motion.  Internal carotid artery is patent bilaterally.  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 Anterior and middle cerebral arteries are patent but poorly visualized due to motion.  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  IMPRESSION: Limited study due to motion.  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 Hypoplastic left A1 segment.   Intracranial circulation not well evaluated on this study.  Original Report Authenticated By: Truett Perna, M.D.   Mr Brain Wo Contrast  04/12/2011  *RADIOLOGY REPORT*  Clinical Data:  Confusion, diabetes, abnormal CT head.  MRI HEAD WITHOUT CONTRAST MRA HEAD WITHOUT CONTRAST  Technique:  Multiplanar, multiecho pulse sequences of the brain and surrounding structures were obtained without intravenous contrast. Angiographic images of the head were obtained using MRA technique without contrast.  Comparison:  CT head 04/12/2011  MRI HEAD  Findings:  Image quality degraded by motion.  There is motion on diffusion weighted imaging which was repeated.  Repeat imaging also has motion.  Despite motion, there is evidence of acute infarct in various vascular territories.  Small area acute infarct in the right inferior cerebellum.  Small area of acute infarct in the head of the caudate on the right and in the right putamen.  Small acute infarcts  in the right posterior temporal lobe and in the right parietal white matter.  Acute infarct also present in the right medial frontal lobe in the anterior cerebral artery territory.  No acute infarct on the left.  Ventricle size is normal.  Negative for hemorrhage or mass lesion. No significant chronic ischemic changes are present.  IMPRESSION: Multiple areas of acute infarct  involving the right cerebellum, right basal ganglia, right posterior temporal, right parietal, and right frontal lobes.  These involve different vascular territories and are suggestive of arterial emboli.  MRA HEAD  Findings: Image quality degraded by motion.  The patient moved midway through the  the exam causing misregistration artifact.  Both vertebral arteries are patent to the basilar.  Left vertebral artery is patent.  PICA is patent bilaterally.  Basilar artery is patent.  Superior cerebellar and posterior cerebral arteries are patent bilaterally.  Limited visualization of the distal posterior cerebral  arteries due to artifact from motion.  Internal carotid artery is patent bilaterally.  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 Anterior and middle cerebral arteries are patent but poorly visualized due to motion.  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  IMPRESSION: Limited study due to motion.  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 Hypoplastic left A1 segment.  Intracranial circulation not well evaluated on this study.  Original Report Authenticated By: Truett Perna, M.D.    Review of Systems  Constitutional: Positive for malaise/fatigue.  HENT: Negative.   Eyes: Negative.   Respiratory: Negative.   Cardiovascular: Negative.   Gastrointestinal: Negative.   Genitourinary: Negative.   Musculoskeletal: Positive for joint pain.  Endo/Heme/Allergies: Negative.   Psychiatric/Behavioral: Negative.    Blood pressure 119/74, pulse 99, temperature 97.9 F (36.6 C), temperature source Oral, resp. rate 20, height 5\' 1"  (1.549 m), weight 67.8 kg (149 lb 7.6 oz), last menstrual period 12/28/2010, SpO2 96.00%. Physical Exam  Assessment/Plan: See dictation   [place] 04/14/2011, 9:50 AM

## 2011-04-14 NOTE — Progress Notes (Signed)
At 2109, the patient's blood sugar was checked and it was 36. Patient was alert and had no signs of hypoglycemia. Orange was given to drink. Blood sugar was checked again at 2137 and it was 33. Orange was given again and blood sugar was checked at 2201 and blood sugar was 32. Then 70ml of Dextrose 50 was given and blood sugar went to 86 at 2227. Doctor was notified, so he could be made aware. Doctor ordered CBG's to be checked every 4 hours. RN then checked blood sugar at 2319 and it was 73, so orange juice was given. At [place], blood sugar was checked again and it was 62, so another 59ml of Dextrose 50 was given and at 0053, blood sugar was 105. Will continue to monitor.

## 2011-04-14 NOTE — Progress Notes (Signed)
Patient ID: [name], female   DOB: 1970-05-12, 41 y.o.   MRN:

## 2011-04-14 NOTE — Progress Notes (Signed)
CBG 62. Encouraged patient to eat lunch. Will recheck CBG.

## 2011-04-14 NOTE — Progress Notes (Signed)
Unless patient eats 50% of meals. Do not give insulin sliding scale coverage or scheduled meal time coverage, per Dr. Dorris Fetch, due to hypoglycemic episode.  Patient CBG is 188, however patient did not eat any dinner.

## 2011-04-14 NOTE — Progress Notes (Signed)
Patient has been given all PRN pain medicine and scheduled pain medicine, she is still screaming in pain. Doctor was notified and new orders were given for Dilaudid 2mg  one time dose and then every 3 hours PRN. Will continue to monitor.

## 2011-04-14 NOTE — Progress Notes (Signed)
Patient seen and examined, discussed with Ms. Bonnell Public. 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 Dr. Lattie Haw recommended a TEE for today, however patient not kept n.p.o., and also due to busy endoscopy schedule, could not subsequently arrange until tomorrow morning.  She is awake and alert although still seems somewhat confused on my exam, complains of pain in her left foot. She is afebrile, heart rate in the 90s, blood pressure 119/74. Lungs exhibit diminished breath sounds but nonlabored at rest, Cardiac exam without gallop or loud murmur.  Laboratory data reviewed with potassium 4.5, BUN 12, creatinine 1.3.  As noted, TEE has been scheduled for tomorrow morning, confirmed with endoscopy unit and also echocardiography department. She will be kept n.p.o. after midnight.  Satira Sark, M.D., F.A.C.C.

## 2011-04-14 NOTE — Progress Notes (Signed)
CBG 42. Pt did not eat lunch. Only took a few sips of juice. 1 amp Dextrose 50 IV 25 mL given. Will recheck CBG.

## 2011-04-15 ENCOUNTER — Inpatient Hospital Stay (HOSPITAL_COMMUNITY)
Admit: 2011-04-15 | Discharge: 2011-04-15 | Disposition: A | Discharge status: Home / Self Care | Payer: Medicaid Other | Attending: Neurology | Admitting: Neurology

## 2011-04-15 ENCOUNTER — Inpatient Hospital Stay (HOSPITAL_COMMUNITY): Payer: Medicaid Other

## 2011-04-15 ENCOUNTER — Other Ambulatory Visit: Payer: Self-pay | Admitting: *Deleted

## 2011-04-15 ENCOUNTER — Encounter (HOSPITAL_COMMUNITY): Payer: Self-pay | Admitting: *Deleted

## 2011-04-15 ENCOUNTER — Encounter (HOSPITAL_COMMUNITY)
Admission: EM | Disposition: A | Discharge status: Skilled Nursing Facility | Payer: Self-pay | Source: Home / Self Care | Attending: Pulmonary Disease

## 2011-04-15 DIAGNOSIS — I639 Cerebral infarction, unspecified: Secondary | ICD-10-CM

## 2011-04-15 HISTORY — PX: TEE WITHOUT CARDIOVERSION: SHX5443

## 2011-04-15 LAB — GLUCOSE, CAPILLARY
Glucose-Capillary: 262 mg/dL — ABNORMAL HIGH (ref 70–99)
Glucose-Capillary: 201 mg/dL — ABNORMAL HIGH (ref 70–99)
Glucose-Capillary: 130 mg/dL — ABNORMAL HIGH (ref 70–99)
Glucose-Capillary: 136 mg/dL — ABNORMAL HIGH (ref 70–99)
Glucose-Capillary: 141 mg/dL — ABNORMAL HIGH (ref 70–99)

## 2011-04-15 LAB — AMMONIA: Ammonia: 23 umol/L (ref 11–60)

## 2011-04-15 LAB — PROTIME-INR
INR: 3.1 — ABNORMAL HIGH (ref 0.00–1.49)
Prothrombin Time: 32.4 seconds — ABNORMAL HIGH (ref 11.6–15.2)

## 2011-04-15 SURGERY — ECHOCARDIOGRAM, TRANSESOPHAGEAL
Anesthesia: Moderate Sedation

## 2011-04-15 MED ORDER — OMEGA-3-ACID ETHYL ESTERS 1 G PO CAPS
1.0000 g | ORAL_CAPSULE | Freq: Once | ORAL | Status: AC
Start: 2011-04-15 — End: 2011-04-15
  Administered 2011-04-15: 1 g via ORAL

## 2011-04-15 MED ORDER — MIDAZOLAM HCL 10 MG/2ML IJ SOLN: 10.0000 mg | Freq: Once | INTRAMUSCULAR | Status: DC

## 2011-04-15 MED ORDER — BENZOCAINE 20 % MT SOLN: 1.0000 "application " | OROMUCOSAL | Status: DC | PRN

## 2011-04-15 MED ORDER — SODIUM CHLORIDE 0.9 % IJ SOLN: 3.0000 mL | Freq: Two times a day (BID) | INTRAMUSCULAR | Status: DC

## 2011-04-15 MED ORDER — SODIUM CHLORIDE 0.45 % IV SOLN: INTRAVENOUS | Status: DC

## 2011-04-15 MED ORDER — MIDAZOLAM HCL 5 MG/5ML IJ SOLN: 10.0000 mg | Freq: Once | INTRAMUSCULAR | Status: DC

## 2011-04-15 MED ORDER — SODIUM CHLORIDE 0.9 % IJ SOLN: 3.0000 mL | INTRAMUSCULAR | Status: DC | PRN

## 2011-04-15 MED ORDER — ADULT MULTIVITAMIN W/MINERALS CH
1.0000 | ORAL_TABLET | Freq: Once | ORAL | Status: AC
  Administered 2011-04-15: 1 via ORAL

## 2011-04-15 MED ORDER — FENTANYL CITRATE 0.05 MG/ML IJ SOLN
INTRAMUSCULAR | Status: AC
  Filled 2011-04-15: qty 2

## 2011-04-15 MED ORDER — WARFARIN SODIUM 1 MG PO TABS
0.5000 mg | ORAL_TABLET | Freq: Once | ORAL | Status: AC
  Administered 2011-04-15: 0.5 mg via ORAL
  Filled 2011-04-15: qty 1

## 2011-04-15 MED ORDER — MIDAZOLAM HCL 5 MG/5ML IJ SOLN
INTRAMUSCULAR | Status: AC
  Filled 2011-04-15: qty 10

## 2011-04-15 MED ORDER — CYANOCOBALAMIN 1000 MCG/ML IJ SOLN
1000.0000 ug | Freq: Once | INTRAMUSCULAR | Status: AC
  Administered 2011-04-15: 1000 ug via INTRAMUSCULAR
  Filled 2011-04-15: qty 1

## 2011-04-15 MED ORDER — FENTANYL CITRATE 0.05 MG/ML IJ SOLN: 250.0000 ug | Freq: Once | INTRAMUSCULAR | Status: DC

## 2011-04-15 MED ORDER — OXYCODONE HCL 5 MG PO TABS
15.0000 mg | ORAL_TABLET | Freq: Once | ORAL | Status: AC
  Administered 2011-04-15: 15 mg via ORAL

## 2011-04-15 MED ORDER — SODIUM CHLORIDE 0.9 % IV SOLN: 250.0000 mL | INTRAVENOUS | Status: DC | PRN

## 2011-04-15 MED ORDER — AMLODIPINE BESYLATE 5 MG PO TABS
2.5000 mg | ORAL_TABLET | Freq: Once | ORAL | Status: AC
  Administered 2011-04-15: 2.5 mg via ORAL

## 2011-04-15 MED ORDER — SERTRALINE HCL 50 MG PO TABS
50.0000 mg | ORAL_TABLET | Freq: Once | ORAL | Status: AC
  Administered 2011-04-15: 50 mg via ORAL

## 2011-04-15 MED ORDER — POTASSIUM CHLORIDE CRYS ER 20 MEQ PO TBCR
20.0000 meq | EXTENDED_RELEASE_TABLET | Freq: Once | ORAL | Status: AC
Start: 2011-04-15 — End: 2011-04-15
  Administered 2011-04-15: 20 meq via ORAL

## 2011-04-15 MED ORDER — MIDAZOLAM HCL 5 MG/5ML IJ SOLN
INTRAMUSCULAR | Status: DC | PRN
  Administered 2011-04-15: 2 mg via INTRAVENOUS
  Administered 2011-04-15: 1 mg via INTRAVENOUS
  Administered 2011-04-15 (×2): 2 mg via INTRAVENOUS

## 2011-04-15 MED ORDER — WARFARIN - PHARMACIST DOSING INPATIENT: Freq: Every day | Status: DC

## 2011-04-15 NOTE — Progress Notes (Signed)
Nursing secretary at the desk, saw on camera monitor pt sitting in the floor, while bed alarm was going off.  Nursing staff entered room and assisted patient back to bed. Patient is confused and states " I was trying to leave and go home" Vital signs and documented. Pt did complain of left lower extremity pain. MD notified. X-ray of left hip ordered to assess for any injuries. Family notified of event.  Video in camera room to be reviewed of actual event that led to fall.

## 2011-04-15 NOTE — Progress Notes (Signed)
Subjective: She continues to be somewhat confused. She is scheduled for transesophageal echocardiogram today. They help from consultants is noted and appreciated. I discussed with her again the fact that she has had a stroke in addition to all of her other problems. I don't think she fully understands. I change her medications from hydromorphone to morphine and that seems to cause a little bit less confusion  Objective: Vital signs in last 24 hours: Temp:  [98.7 F (37.1 C)-98.8 F (37.1 C)] 98.7 F (37.1 C) (03/28 0423) Pulse Rate:  [109-114] 114  (03/28 0423) Resp:  [20] 20  (03/28 0423) BP: (92-119)/(58-60) 92/58 mmHg (03/28 0423) SpO2:  [91 %-93 %] 93 % (03/28 0423) Weight change:  Last BM Date: 04/14/11  Intake/Output from previous day: 03/27 0701 - 03/28 0700 In: 3339.5 [P.O.:240; I.V.:2849.5; IV Piggyback:250] Out: 550 [Urine:550]  PHYSICAL EXAM General appearance: alert and Confused Resp: clear to auscultation bilaterally Cardio: regular rate and rhythm, S1, S2 normal, no murmur, click, rub or gallop GI: soft, non-tender; bowel sounds normal; no masses,  no organomegaly Extremities: Exam of her foot is essentially unchanged. Her foot remains cool with some necrotic areas on the great toe and the third toe  Lab Results:    Basic Metabolic Panel:  Basename 04/14/11 0544  NA 136  K 4.5  CL 108  CO2 23  GLUCOSE 171*  BUN 12  CREATININE 1.35*  CALCIUM 7.9*  MG --  PHOS --   Liver Function Tests: No results found for this basename: AST:2,ALT:2,ALKPHOS:2,BILITOT:2,PROT:2,ALBUMIN:2 in the last 72 hours No results found for this basename: LIPASE:2,AMYLASE:2 in the last 72 hours No results found for this basename: AMMONIA:2 in the last 72 hours CBC: No results found for this basename: WBC:2,NEUTROABS:2,HGB:2,HCT:2,MCV:2,PLT:2 in the last 72 hours Cardiac Enzymes: No results found for this basename: CKTOTAL:3,CKMB:3,CKMBINDEX:3,TROPONINI:3 in the last 72  hours BNP: No results found for this basename: PROBNP:3 in the last 72 hours D-Dimer: No results found for this basename: DDIMER:2 in the last 72 hours CBG:  Basename 04/15/11 0725 04/14/11 2021 04/14/11 1715 04/14/11 1408 04/14/11 1324 04/14/11 1201  GLUCAP 262* 334* 188* 102* 46* 62*   Hemoglobin A1C: No results found for this basename: HGBA1C in the last 72 hours Fasting Lipid Panel: No results found for this basename: CHOL,HDL,LDLCALC,TRIG,CHOLHDL,LDLDIRECT in the last 72 hours Thyroid Function Tests:  Basename 04/14/11 0500  TSH 6.633*  T4TOTAL --  FREET4 --  T3FREE --  THYROIDAB --   Anemia Panel:  Basename 04/14/11 0500  VITAMINB12 226  FOLATE --  FERRITIN --  TIBC --  IRON --  RETICCTPCT --   Coagulation:  Basename 04/15/11 0404 04/12/11 1640  LABPROT 32.4* 25.8*  INR 3.10* 2.31*   Urine Drug Screen: Drugs of Abuse     Component Value Date/Time   LABOPIA NONE DETECTED 03/21/2010 2130   COCAINSCRNUR POSITIVE* 03/21/2010 2130   LABBENZ NONE DETECTED 03/21/2010 2130   AMPHETMU NONE DETECTED 03/21/2010 2130   THCU NONE DETECTED 03/21/2010 2130   LABBARB  Value: NONE DETECTED        DRUG SCREEN FOR MEDICAL PURPOSES ONLY.  IF CONFIRMATION IS NEEDED FOR ANY PURPOSE, NOTIFY LAB WITHIN 5 DAYS.        LOWEST DETECTABLE LIMITS FOR URINE DRUG SCREEN Drug Class       Cutoff (ng/mL) Amphetamine      1000 Barbiturate      200 Benzodiazepine   [id number] Tricyclics       [id number] Opiates  300 Cocaine          300 THC              50 03/21/2010 2130    Alcohol Level: No results found for this basename: ETH:2 in the last 72 hours Urinalysis: No results found for this basename: COLORURINE:2,APPERANCEUR:2,LABSPEC:2,PHURINE:2,GLUCOSEU:2,HGBUR:2,BILIRUBINUR:2,KETONESUR:2,PROTEINUR:2,UROBILINOGEN:2,NITRITE:2,LEUKOCYTESUR:2 in the last 72 hours Misc. Labs:  ABGS No results found for this basename: PHART,PCO2,PO2ART,TCO2,HCO3 in the last 72 hours CULTURES Recent Results (from the past 240  hour(s))  MRSA PCR SCREENING     Status: Normal   Collection Time   04/05/11 10:42 PM      Component Value Range Status Comment   MRSA by PCR NEGATIVE  NEGATIVE  Final   CULTURE, BLOOD (ROUTINE X 2)     Status: Normal (Preliminary result)   Collection Time   04/13/11  6:34 PM      Component Value Range Status Comment   Specimen Description BLOOD LEFT WRIST   Final    Special Requests     Final    Value: BOTTLES DRAWN AEROBIC AND ANAEROBIC AEB Box ANA 4CC   Culture NO GROWTH 1 DAY   Final    Report Status PENDING   Incomplete    Studies/Results: US Carotid Duplex Bilateral  04/14/2011  *RADIOLOGY REPORT*  Clinical Data: Acute CVA.  BILATERAL CAROTID DUPLEX ULTRASOUND  Technique: Pearline Cables scale imaging, color Doppler and duplex ultrasound was performed of bilateral carotid and vertebral arteries in the neck.  Comparison:  MRI 04/12/2011  Criteria:  Quantification of carotid stenosis is based on velocity parameters that correlate the residual internal carotid diameter with NASCET-based stenosis levels, using the diameter of the distal internal carotid lumen as the denominator for stenosis measurement.  The following velocity measurements were obtained:                   PEAK SYSTOLIC/END DIASTOLIC RIGHT ICA:                        115cm/sec CCA:                        [id number] SYSTOLIC ICA/CCA RATIO:     1.1 DIASTOLIC ICA/CCA RATIO:    1.4 ECA:                        156cm/sec  LEFT ICA:                        122cm/sec CCA:                        [id number] SYSTOLIC ICA/CCA RATIO:     0.8 DIASTOLIC ICA/CCA RATIO:    1.5 ECA:                        168cm/sec  Findings:  RIGHT CAROTID ARTERY: Intimal thickening in the right common carotid artery.  A small amount of plaque in the distal carotid bulb.  Minimal plaque in the proximal right internal carotid artery.  Normal velocities and waveforms in the right internal carotid artery.  RIGHT VERTEBRAL ARTERY:  Antegrade flow and normal waveform in the right  vertebral artery.  LEFT CAROTID ARTERY: There is some intimal thickening in the left common carotid artery.  Left carotid arteries are patent without significant plaque or stenosis.  LEFT VERTEBRAL ARTERY:  Antegrade flow and normal waveform in the left vertebral artery.  IMPRESSION: Minimal atherosclerotic disease in the carotid arteries.  No significant carotid artery stenosis.  Estimated degree of stenosis in the internal carotid arteries is less than 50% bilaterally.  Original Report Authenticated By: Markus Daft, M.D.    Medications:  Prior to Admission:  Prescriptions prior to admission  Medication Sig Dispense Refill  . acetaminophen (TYLENOL) 500 MG tablet Take 1,000 mg by mouth every 6 (six) hours as needed. For pain       . ALPRAZolam (XANAX) 1 MG tablet Take 1 mg by mouth daily as needed. For anxiety      . amLODipine (NORVASC) 2.5 MG tablet Take 1 tablet (2.5 mg total) by mouth daily.  30 tablet  5  . cephALEXin (KEFLEX) 500 MG capsule Take 500 mg by mouth 3 (three) times daily.      . diphenhydrAMINE (BENADRYL) 25 MG tablet Take 25 mg by mouth at bedtime as needed. For sleep      . fish oil-omega-3 fatty acids 1000 MG capsule Take 1 g by mouth daily.      . furosemide (LASIX) 20 MG tablet Take 20 mg by mouth 3 (three) times a week. On Mondays, Wednesdays, and Fridays      . insulin aspart (NOVOLOG) 100 UNIT/ML injection Inject 5 Units into the skin 3 (three) times daily before meals.  1 vial  12  . insulin glargine (LANTUS) 100 UNIT/ML injection Inject 15 Units into the skin at bedtime.  10 mL  12  . Multiple Vitamins-Minerals (MULTIVITAMINS THER. W/MINERALS) TABS Take 1 tablet by mouth daily.        . ondansetron (ZOFRAN-ODT) 8 MG disintegrating tablet Take 8 mg by mouth every 12 (twelve) hours as needed. Nausea      . oxyCODONE-acetaminophen (PERCOCET) 10-325 MG per tablet Take 1 tablet by mouth every 6 (six) hours as needed for pain.  30 tablet  0  . potassium chloride SA  (K-DUR,KLOR-CON) 20 MEQ tablet Take 0.5 tablets (10 mEq total) by mouth daily.  7 tablet  0  . pravastatin (PRAVACHOL) 40 MG tablet Take 1 tablet (40 mg total) by mouth daily.  30 tablet  5  . sertraline (ZOLOFT) 50 MG tablet Take 50 mg by mouth daily.       Marland Kitchen warfarin (COUMADIN) 3 MG tablet Take 3 mg by mouth daily.       Scheduled:   . amLODipine  2.5 mg Oral Daily  . ciprofloxacin  500 mg Oral BID  . clopidogrel  75 mg Oral Q breakfast  . dextrose      . furosemide  20 mg Oral 3 times weekly  . insulin aspart  0-9 Units Subcutaneous TID WC  . insulin aspart  5 Units Subcutaneous TID WC  . insulin glargine  15 Units Subcutaneous QHS  . mulitivitamin with minerals  1 tablet Oral Daily  . omega-3 acid ethyl esters  1 g Oral Daily  . oxyCODONE  15 mg Oral Q6H  . pantoprazole  40 mg Oral Q1200  . potassium chloride SA  20 mEq Oral TID  . sertraline  50 mg Oral Daily  . simvastatin  20 mg Oral q1800  . sodium chloride      . vancomycin  1,250 mg Intravenous Q24H  . warfarin  3 mg Oral q1800  . DISCONTD: insulin aspart  10 Units Subcutaneous TID WC  . DISCONTD: insulin aspart  8 Units Subcutaneous TID WC  .  DISCONTD: insulin glargine  20 Units Subcutaneous QHS   Continuous:   . sodium chloride 75 mL/hr at 04/15/11 [id number]   [id number], ALPRAZolam, dextrose, dextrose, dextrose, diphenhydrAMINE, morphine injection, ondansetron, DISCONTD:  HYDROmorphone (DILAUDID) injection  Assesment: She has now had a stroke. She has osteomyelitis of the foot. She had thromboembolism of lower extremity she's had delirium. She has diabetes Principal Problem:  *Osteomyelitis of foot, left, acute Active Problems:  Diabetes mellitus  Peripheral vascular disease in diabetes mellitus  Hyperlipidemia  Thromboembolism of lower extremity artery  Cellulitis of left foot  Cerebral embolus with cerebral infarction  Delirium    Plan: She will have transesophageal echocardiogram. I still have  concerns about her going home without a rehabilitation stay and I expressed that to her again.    LOS: 10 days   Alizah Sills L 04/15/2011, 8:12 AM

## 2011-04-15 NOTE — Progress Notes (Signed)
Safety sitter assigned to patients room now.

## 2011-04-15 NOTE — H&P (View-Only) (Signed)
CARDIOLOGY CONSULT NOTE  Patient ID: [name] MRN: [id number], DOB/AGE: 1970/10/21   Date of Consult: 04/06/2011  Age: 41 y.o.   Primary Physician: Alonza Bogus, MD, MD Primary Cardiologist: Previously Dr. Haroldine Laws   History of present illness: Consultation requested by Dr. Karie Kirks as the result of EKG abnormalities in this woman w/o prior cardiac hx  She was previously evaluated by [facility] 2 months ago when she presented to ED thrombus in the infrarenal Ao and occlusions of the AT, PT, and Peroneal arteries.  She was treated with intra-arterial thrombolytics and then underwent surgical exploration and thrombectomy for multiple left leg vessels as well as patch angioplasty of the left popliteal.  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 She has had continued pain in that leg and has been relatively immobile but has otherwise did well following that hospital admission.   A CT scan of the chest showed minor calcifications in the aorta without comment on the coronary arteries. An echocardiogram showed normal to hyperdynamic left ventricular systolic function without other significant cardiac abnormalities.  She has recently presented twice with diabetes out of control resulting in this admission as well as admission a few days ago.  EKG shows evidence for previous anteroseptal and lateral infarction, but is unchanged from 01/2011.  Problem List: Past Medical History  Diagnosis Date  . Shingles     11/2010  . Cellulitis     of face - 12/2010  . Peripheral vascular disease in diabetes mellitus   . Hyperlipidemia   . Tobacco abuse     0.5 - 1ppd x 21 yrs  . Headache   . Renal disorder     glomerulonephritis  . Blood transfusion   . Anxiety   . Depression   . Peripheral vascular disease   . Diabetes mellitus     IDDM Since Age 44  . Embolism - blood clot     Past Surgical History  Procedure Date  . Wrist surgery    left  . Embolectomy 01/29/2011    Procedure: EMBOLECTOMY;  Surgeon: Mal Misty, MD;  Location: [facility] OR;  Service: Vascular;  Laterality: Left;  Left Popliteal and Tibial Embolectomy with patch angioplasty  . Dilation and curettage of uterus   . Multiple tooth extractions   . Amputation 03/03/2011    Procedure: AMPUTATION DIGIT;  Surgeon: Newt Minion, MD;  Location: [facility];  Service: Orthopedics;  Laterality: Left;  Left Foot Amputation 4th and 5th toes at MTP joint     Allergies:  Allergies  Allergen Reactions  . Aspirin Other (See Comments)    Cannot take this medication due to kidney disease  . Ibuprofen Other (See Comments)    Kidney disease    Facility-Administered Medications Ordered in Other Encounters  Medication Dose Route Frequency Provider Last Rate Last Dose  . ALPRAZolam Duanne Moron) tablet 1 mg  1 mg Oral TID PRN  Bellow, MD   1 mg at 04/06/11 0323  . amLODipine (NORVASC) tablet 2.5 mg  2.5 mg Oral Daily  Bellow, MD   2.5 mg at 04/06/11 1039  . diphenhydrAMINE (BENADRYL) capsule 25 mg  25 mg Oral QHS PRN  Bellow, MD      . furosemide (LASIX) tablet 20 mg  20 mg Oral 3 times weekly  Bellow, MD      . HYDROmorphone (DILAUDID) injection 1 mg  1 mg Intravenous Q2H PRN Alonza Bogus,  MD   1 mg at 04/06/11 1428  . insulin aspart (novoLOG) injection 0-5 Units  0-5 Units Subcutaneous QHS  Bellow, MD      . insulin aspart (novoLOG) injection 0-9 Units  0-9 Units Subcutaneous TID WC  Bellow, MD   2 Units at 04/06/11 1643  . insulin glargine (LANTUS) injection 10 Units  10 Units Subcutaneous Once  Bellow, MD   10 Units at 04/06/11 0415  . insulin glargine (LANTUS) injection 10 Units  10 Units Subcutaneous QHS  Bellow, MD      . insulin regular (NOVOLIN R,HUMULIN R) 100 units/mL injection 10 Units  10 Units Subcutaneous Once Mervin Kung, MD      . mulitivitamin with minerals tablet 1 tablet  1  tablet Oral Daily  Bellow, MD   1 tablet at 04/06/11 1038  . omega-3 acid ethyl esters (LOVAZA) capsule 1 g  1 g Oral Daily  Bellow, MD   1 g at 04/06/11 1040  . ondansetron (ZOFRAN-ODT) disintegrating tablet 8 mg  8 mg Oral Q8H PRN  Bellow, MD      . oxyCODONE-acetaminophen [facility]) 5-325 MG per tablet 2 tablet  2 tablet Oral Q6H PRN  Bellow, MD   2 tablet at 04/06/11 1303  . oxyCODONE-acetaminophen (PERCOCET) 5-325 MG per tablet        1 tablet at 04/05/11 2126  . pantoprazole (PROTONIX) EC tablet 40 mg  40 mg Oral Q1200 Elsie Stain, MD      . piperacillin-tazobactam (ZOSYN) IVPB 3.375 g  3.375 g Intravenous Q8H  Bellow, MD   3.375 g at 04/06/11 1530  . potassium chloride SA (K-DUR,KLOR-CON) CR tablet 10 mEq  10 mEq Oral Daily  Bellow, MD   10 mEq at 04/06/11 1040  . sertraline (ZOLOFT) tablet 50 mg  50 mg Oral Daily  Bellow, MD   50 mg at 04/06/11 1039  . simvastatin (ZOCOR) tablet 20 mg  20 mg Oral q1800  Bellow, MD      . vancomycin (VANCOCIN) IVPB 1000 mg/200 mL premix  1,000 mg Intravenous Q12H  Bellow, MD   1,000 mg at 04/06/11 1200  . warfarin (COUMADIN) tablet 3 mg  3 mg Oral q1800  Bellow, MD       Family History  Problem Relation Age of Onset  . COPD Mother     alive - 62  . Hypertension Mother   . Diabetes Mother   . Stroke Mother   . Coronary artery disease Mother   . Diabetes Father     alive - 9  . Hypertension Father   . Coronary artery disease Father   . Anesthesia problems Neg Hx      History   Social History  . Marital Status: Single    Spouse Name: N/A    Number of Children: N/A  . Years of Education: N/A   Social History Main Topics  . Smoking status: Passive Smoker -- 1.0 packs/day for 21 years    Types: Cigarettes    Last Attempt to Quit: 01/30/2011  . Smokeless tobacco: Never Used  . Alcohol Use: No  . Drug Use: 1 per week     Special: Marijuana     former  . Sexually Active: Not on file     pt stated that her tubal was only 50% effective- "not cut and burned"   Social History Narrative   Unemployed.  Lives in [place] with [place], [place], and mother.  Remains physically active.  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     Review of Systems: General: negative for chills, fever, night sweats or weight changes.  Cardiovascular: negative for chest pain, class II dyspnea on exertion.  No edema, orthopnea, palpitations, paroxysmal nocturnal dyspnea. Dermatological: negative for rash Respiratory: negative for cough or wheezing Urologic: negative for hematuria Abdominal: negative for nausea, vomiting, diarrhea, bright red blood per rectum, melena, or hematemesis Neurologic: negative for visual changes, syncope, or dizziness Extremities: Chronic pain in left leg below the knee All other systems reviewed and are otherwise negative except as noted above.  Physical Exam: Blood pressure 132/74, pulse 80, temperature 98 F (36.7 C), temperature source Oral, resp. rate 18, height 5\' 1"  (1.549 m), weight 61.598 kg (135 lb 12.8 oz), last menstrual period 01/11/2011, SpO2 97.00%.  General: Well developed, well nourished, in no acute distress. Head: Normocephalic, atraumatic, sclera non-icteric, no xanthomas, nares are without discharge.  Neck: Supple without bruits or JVD. Lungs:  Resp regular and unlabored, CTA. Heart: RRR no s3, s4, or murmurs. Abdomen: Soft, non-tender, non-distended, BS + x 4.  Msk:  Strength and tone appears normal for age. Extremities: No clubbing, cyanosis or edema. Left foot is warm and well perfused but extremely tender to the touch with dorsalis pedis pulse present.  S/p 4-5th toe amputation Neuro: Alert and oriented X 3. Moves all extremities spontaneously. Psych: Normal affect.  Radiology/Studies: Dg Chest Portable 1 View  01/27/2011  *RADIOLOGY REPORT*  Clinical Data: Left lower  extremity pain, no pulses, hypertension  PORTABLE CHEST - 1 VIEW  Comparison: Portable exam 1029 hours compared to 03/21/2010  Findings: Normal heart size, mediastinal contours, and pulmonary vascularity. Mild peribronchial thickening. No pulmonary infiltrate, pleural effusion, or pneumothorax. Bones unremarkable.  IMPRESSION: Minimal chronic bronchitic changes.  Original Report Authenticated By: Burnetta Sabin, M.D.   EKG:  Normal sinus rhythm; prior lateral infarction; probable prior anteroseptal infarction.  No change when compared to previous tracing performed 02/05/2011  ASSESSMENT AND PLAN:   1. Abnormal EKG: Patient has had no sizable myocardial infarction based upon normal regional and global LV systolic function by echocardiography 2 months ago at which time EKG was identical to the one obtained during this admission.   Conduction system disease could account for the abnormalities in the lateral leads, and lead placement issues for the poor R wave progression seen in the leads V1-V3. There is a possibility of myocardial scarring related to nonischemic processes or to nontransmural infarction.  I. will review the previously obtained CT scan to determine if coronary calcification was present. If not, the likelihood of significant coronary disease is extremely low. A pharmacologic stress nuclear study can be obtained following discharge to exclude significant myocardial injury or ischemia. 2. Arterial thromboembolism: Etiology of thromboembolic occlusion of the left lower extremity arteries was not determined during admission in January.  There was no apparent cardiac source on echocardiography. Silent paroxysmal atrial fibrillation is a frequent cause. There is no evidence for patent foramen ovale or venous thromboembolism causing paradoxic embolization. There is some atherosclerotic disease of the thoracic and abdominal aorta, but this is modest. The advisability of additional testing to identify a  source will be reconsidered. 3. Diabetes:  Improved with initial treatment.    4.Tobacco Abuse:   The critical importance of complete cessation of tobacco use was discussed with the patient.   Signed, [name], NP 04/06/2011, 5:18 PM  Patient seen and examined with Ignacia Bayley, NP. We discussed all aspects of the encounter. I agree with the assessment and plan as stated above.    Very difficult case. Given her risk factors and ECG she almost certainly has underlying CAD. However functional capacity is not too bad. She appears to be at high risk of losing her leg if she does not have surgery soon. Overall I think she is at moderate to high (but not prohibitive) risk for serious peri-op cardiac complications. At this point would recommend 2-D echo (to look at [facility] and possible cardiac source of embolus). If echo shows significant LV dysfunction would recommend cath tomorrow to exclude high-grade proximal CAD. If EF is normal, I think we should proceed with surgery given high risk of limb loss with knowledge that surgery will be of considerable cardiac risk to her.  Agree with heparin and b-block (as BP tolerates). Has real ASA allergy so cannot use that. No Plavix due to need for surgery.   Discussed at length with patient. Will follow with you.  Jermane Brayboy,MD 5:18 PM

## 2011-04-15 NOTE — Evaluation (Signed)
Physical Therapy Evaluation Patient Details Name: [name] MRN: [id number] DOB: Apr 08, 1970 Today's Date: 04/15/2011  Problem List:  Patient Active Problem List  Diagnoses  . Diabetes mellitus  . Peripheral vascular disease in diabetes mellitus  . Hyperlipidemia  . Tobacco abuse  . Abnormal ECG  . Thromboembolism of lower extremity artery  . Cellulitis of left foot  . Osteomyelitis of foot, left, acute  . Cerebral embolus with cerebral infarction  . Delirium    Past Medical History:  Past Medical History  Diagnosis Date  . Shingles     11/2010  . Cellulitis     of face - 12/2010  . Peripheral vascular disease in diabetes mellitus   . Hyperlipidemia   . Tobacco abuse     0.5 - 1ppd x 21 yrs  . Headache   . Renal disorder     glomerulonephritis  . Blood transfusion   . Anxiety   . Depression   . Peripheral vascular disease   . Diabetes mellitus     IDDM Since Age 26  . Embolism - blood clot   . Renal insufficiency    Past Surgical History:  Past Surgical History  Procedure Date  . Wrist surgery     left  . Embolectomy 01/29/2011    Procedure: EMBOLECTOMY;  Surgeon: Mal Misty, MD;  Location: [facility] OR;  Service: Vascular;  Laterality: Left;  Left Popliteal and Tibial Embolectomy with patch angioplasty  . Dilation and curettage of uterus   . Multiple tooth extractions   . Amputation 03/03/2011    Procedure: AMPUTATION DIGIT;  Surgeon: Newt Minion, MD;  Location: [facility];  Service: Orthopedics;  Laterality: Left;  Left Foot Amputation 4th and 5th toes at MTP joint    PT Assessment/Plan/Recommendation PT Assessment Clinical Impression Statement: Attempted to see pt for a PT eval, but was unsuccesful with full eval.  This pt screams in pain with any light touch to L foot or even any active motion of LLE on her part.  We will not be able to do anything with her until her pain / vascular/ osteomyelitis issues are addressed.  I will follow along peripherally and  attempt to intervene whenever possible. PT Recommendation/Assessment: Patent does not need any further PT services (until pain is better controlled) Barriers to Discharge: None PT Therapy Diagnosis : Acute pain No Skilled PT: Patient unable to participate in therapy PT Plan PT Frequency: Other (Comment) (will monitor daily and reaassess if possible) PT Recommendation Follow Up Recommendations:  (unable to determine at this time) PT Goals     PT Evaluation Precautions/Restrictions  Precautions Precautions: Fall Required Braces or Orthoses: No Restrictions Weight Bearing Restrictions: Yes Other Position/Activity Restrictions: pt is unable to bear any weight on LLE at this time Prior [place] Lives With: Family Receives Help From: Family Type of Home: House Home Layout: One level Home Access: Stairs to enter Entrance Stairs-Rails: Right;Left;None Entrance Stairs-Number of Steps: 5---states that she has had difficulty getting up the steps PTA Home Adaptive Equipment: Walker - rolling;Wheelchair - manual Additional Comments: uses w/c for activities outside of the house Prior Function Level of Independence: Independent with basic ADLs;Independent with gait;Independent with transfers Driving: No Vocation: Retired Artist: Awake/alert Overall Cognitive Status: Appears within functional limits for tasks assessed Orientation Level: Oriented X4 Cognition - Other Comments: occasionally appears confused and has difficulty following directions, but then seems very clear Sensation/Coordination Sensation Light Touch:  (WNL RLE, unable to touch  LLE) Stereognosis: Not tested Hot/Cold: Not tested Proprioception: Not tested Coordination Gross Motor Movements are Fluid and Coordinated: Not tested Fine Motor Movements are Fluid and Coordinated: Not tested Extremity Assessment RUE Assessment RUE Assessment: Within Functional Limits LUE  Assessment LUE Assessment: Within Functional Limits RLE Assessment RLE Assessment: Within Functional Limits LLE Assessment LLE Assessment: Not tested (has excruciating pain R foot, LE--unable to touch it) Mobility (including Balance) Bed Mobility Bed Mobility: No (unable to move in bed due to severe pain) Transfers Transfers: No (due to pain) Ambulation/Gait Ambulation/Gait: No Stairs: No Wheelchair Mobility Wheelchair Mobility: No  Balance Balance Assessed: No Exercise    End of Session PT - End of Session Activity Tolerance: Patient limited by pain Patient left: in bed;with call bell in reach;with bed alarm set;with family/visitor present General Behavior During Session: [facility] for tasks performed Cognition: [facility] for tasks performed  Sable Feil 04/15/2011, 12:55 PM

## 2011-04-15 NOTE — Progress Notes (Signed)
Patient CBG was 130 at lunch time. Followed up after lunch was given to see if patient ate anything and pt only had half piece of chicken.  Held insulin coverage for lunch.

## 2011-04-15 NOTE — Progress Notes (Signed)
*  PRELIMINARY RESULTS* Echocardiogram Echocardiogram Transesophageal has been performed.  Tera Partridge 04/15/2011, 11:04 AM

## 2011-04-15 NOTE — Interval H&P Note (Signed)
History and Physical Interval Note:  04/15/2011 10:14 AM  [name]  has presented today for surgery, with the diagnosis of cerebral infarction  The various methods of treatment have been discussed with the patient and family. After consideration of risks, benefits and other options for treatment, the patient has consented to : TRANSESOPHAGEAL ECHOCARDIOGRAM (TEE) (N/A) as a diagnostic intervention .  The patients' history has been reviewed, patient examined, no change in status, stable for sprocedure.  I have reviewed the patients' chart and labs.  Questions were answered to the patient's satisfaction.  There has been an interval CVA since the prior assessment, but mental status has recovered to baseline.   [name]

## 2011-04-15 NOTE — Progress Notes (Signed)
Subjective: Interval History:   Objective: Vital signs in last 24 hours: Temp:  [98.7 F (37.1 C)-98.8 F (37.1 C)] 98.7 F (37.1 C) (03/28 0423) Pulse Rate:  [109-114] 114  (03/28 0423) Resp:  [20] 20  (03/28 0423) BP: (92-119)/(58-60) 92/58 mmHg (03/28 0423) SpO2:  [91 %-93 %] 93 % (03/28 0423)  Intake/Output from previous day: 03/27 0701 - 03/28 0700 In: 3339.5 [P.O.:240; I.V.:2849.5; IV Piggyback:250] Out: 550 [Urine:550] Intake/Output this shift:   Nutritional status: NPO   Lab Results:  Basename 04/14/11 0544  WBC --  HGB --  HCT --  PLT --  NA 136  K 4.5  CL 108  CO2 23  GLUCOSE 171*  BUN 12  CREATININE 1.35*  CALCIUM 7.9*  LABA1C --   Lipid Panel No results found for this basename: CHOL,TRIG,HDL,CHOLHDL,VLDL,LDLCALC in the last 72 hours  Studies/Results: US Carotid Duplex Bilateral  04/14/2011  *RADIOLOGY REPORT*  Clinical Data: Acute CVA.  BILATERAL CAROTID DUPLEX ULTRASOUND  Technique: Pearline Cables scale imaging, color Doppler and duplex ultrasound was performed of bilateral carotid and vertebral arteries in the neck.  Comparison:  MRI 04/12/2011  Criteria:  Quantification of carotid stenosis is based on velocity parameters that correlate the residual internal carotid diameter with NASCET-based stenosis levels, using the diameter of the distal internal carotid lumen as the denominator for stenosis measurement.  The following velocity measurements were obtained:                   PEAK SYSTOLIC/END DIASTOLIC RIGHT ICA:                        115cm/sec CCA:                        [id number] SYSTOLIC ICA/CCA RATIO:     1.1 DIASTOLIC ICA/CCA RATIO:    1.4 ECA:                        156cm/sec  LEFT ICA:                        122cm/sec CCA:                        [id number] SYSTOLIC ICA/CCA RATIO:     0.8 DIASTOLIC ICA/CCA RATIO:    1.5 ECA:                        168cm/sec  Findings:  RIGHT CAROTID ARTERY: Intimal thickening in the right common carotid artery.  A small amount  of plaque in the distal carotid bulb.  Minimal plaque in the proximal right internal carotid artery.  Normal velocities and waveforms in the right internal carotid artery.  RIGHT VERTEBRAL ARTERY:  Antegrade flow and normal waveform in the right vertebral artery.  LEFT CAROTID ARTERY: There is some intimal thickening in the left common carotid artery.  Left carotid arteries are patent without significant plaque or stenosis.  LEFT VERTEBRAL ARTERY:  Antegrade flow and normal waveform in the left vertebral artery.  IMPRESSION: Minimal atherosclerotic disease in the carotid arteries.  No significant carotid artery stenosis.  Estimated degree of stenosis in the internal carotid arteries is less than 50% bilaterally.  Original Report Authenticated By: Markus Daft, M.D.    Medications:  Scheduled Meds:   . amLODipine  2.5 mg Oral Daily  .  ciprofloxacin  500 mg Oral BID  . clopidogrel  75 mg Oral Q breakfast  . cyanocobalamin  1,000 mcg Intramuscular Once  . dextrose      . furosemide  20 mg Oral 3 times weekly  . insulin aspart  0-9 Units Subcutaneous TID WC  . insulin aspart  5 Units Subcutaneous TID WC  . insulin glargine  15 Units Subcutaneous QHS  . mulitivitamin with minerals  1 tablet Oral Daily  . omega-3 acid ethyl esters  1 g Oral Daily  . oxyCODONE  15 mg Oral Q6H  . pantoprazole  40 mg Oral Q1200  . potassium chloride SA  20 mEq Oral TID  . sertraline  50 mg Oral Daily  . simvastatin  20 mg Oral q1800  . sodium chloride      . vancomycin  1,250 mg Intravenous Q24H  . warfarin  3 mg Oral q1800  . DISCONTD: insulin aspart  10 Units Subcutaneous TID WC  . DISCONTD: insulin aspart  8 Units Subcutaneous TID WC  . DISCONTD: insulin glargine  20 Units Subcutaneous QHS   Continuous Infusions:   . sodium chloride 75 mL/hr at 04/15/11 0614   PRN Meds:.acetaminophen, ALPRAZolam, dextrose, dextrose, dextrose, diphenhydrAMINE, morphine injection, ondansetron, DISCONTD:  HYDROmorphone  (DILAUDID) injection   Assessment/Plan: See dictation   LOS: 10 days   [name]

## 2011-04-15 NOTE — Progress Notes (Signed)
Poor Po intake during shift. No insulin coverage administered during shift due to blood sugars dropping previously.

## 2011-04-15 NOTE — Progress Notes (Signed)
[name], [name]                ACCOUNT NO.:  [id number]  MEDICAL RECORD NO.:  [id number]  LOCATION:  [id number]                          FACILITY:  [facility]  PHYSICIAN:  [name], MD DATE OF BIRTH:  December 14, 1970  DATE OF PROCEDURE:  04/15/2011 DATE OF DISCHARGE:                                PROGRESS NOTE   REASON FOR FOLLOWUP:  Uncontrolled type 1 diabetes.  SUBJECTIVE:  Today, she has better glycemic profile.  She is still eating about half or less of her meals.  No hypoglycemia since yesterday.  OBJECTIVE:  GENERAL:  She is not in acute distress. VITAL SIGNS:  Blood pressure is 116/45, pulse rate is 98, temperature 98.1. HEENT:  Well hydrated. CHEST:  Clear to auscultation bilaterally. CARDIOVASCULAR:  Normal S1, S2.  No murmur.  No gallop. ABDOMEN:  Soft and nontender. EXTREMITIES:  Dressed left lower extremity.  Her blood glucose profile shows 262 before breakfast, 201 after breakfast, and 130 at lunch.  ASSESSMENT: 1. Type 1 diabetes. 2. Osteomyelitis of left lower extremity. 3. Severe peripheral vascular disease. 4. Thromboembolism of the lower extremity. 5. Cerebral embolus with cerebral infarction.  PLAN:  The patient remains unpredictable in terms of her glycemic profile as well as her sensitivity to insulin therapy.  I will continue Lantus 15 units at bedtime and NovoLog 5 units t.i.d. a.c. if she eats at least half of her meals.  I will continue low-dose sliding scale to cover unexpected hyperglycemia.  In her particular case, it is okay to wait and see how much she can eat before we give prandial insulin.  If she eats less than half of her meal, you can skip prandial insulin and monitor 2 hours after meal.  I will continue to follow her up.          ______________________________ [name], MD     GN/MEDQ  D:  04/15/2011  T:  04/15/2011  Job:  [id number]

## 2011-04-15 NOTE — Progress Notes (Signed)
Patient ID: [name], female   DOB: October 08, 1970, 41 y.o.   MRN: [id number] [phone]

## 2011-04-15 NOTE — Progress Notes (Signed)
UR Chart Review Completed  

## 2011-04-15 NOTE — Progress Notes (Signed)
[name], [name]                ACCOUNT NO.:  [id number]  MEDICAL RECORD NO.:  [id number]  LOCATION:                                 FACILITY:  PHYSICIAN:  Khris Jansson A. Merlene Laughter, M.D. DATE OF BIRTH:  1970/07/30  DATE OF PROCEDURE: DATE OF DISCHARGE:                                PROGRESS NOTE   Overall, she is doing about the same.  Continues to report significant pain in the left leg and left ankle.  Nurses report that she continues to be groggy and drowsy at times, sometimes, very confused.  Today, she is drowsy.  She is dozing, but easily arousable.  She is lucid and coherent when she is awake and follows commands well.  She is oriented x3.  Neck is supple.  Head is normocephalic, atraumatic.  Abdomen is soft.  Extremities continued to have significant swelling pain of the left foot associated with purple discoloration, slight movement causes the patient scream in pain.  She does have good strength in all the extremities at least 4-5.  Her carotid Dopplers shows some atherosclerotic disease probably less than 50 velocity on the left, ICA is 122, the right 115.  LABS EVALUATION:  B12 levels on the lower side 226 and RPR nonreactive. Homocystine 2.9.  TSH slightly elevated at 6.6.  ASSESSMENT AND PLAN:  Multiple small strokes.  She has been therapeutic on warfarin and she still has had multiple small cortical and subcortical infarct.  I did add the Plavix. We will do this for about 3 months.  Given the episodic confusion, EEG will be obtained.  We will go ahead and replace her vitamin B12 level. Also, check an ammonia level.  We will continue to follow the patient.     Cyree Chuong A. Merlene Laughter, M.D.     KAD/MEDQ  D:  04/15/2011  T:  04/15/2011  Job:  [id number]

## 2011-04-15 NOTE — CV Procedure (Signed)
Procedure Note-Transesophageal Echocardiogram [name] [id number] [birth year]  Procedure: TEE Indications:  Embolic CVA  Procedure Details Consent: Risks of procedure as well as the alternatives and risks of each were explained to the (patient/caregiver).  Consent for procedure obtained. Time Out: Verified patient identification, verified procedure and site, verified correct patient position, romazicon and intubation equipment available, meds/allergies/history reviewed.  Performed  Cardiac monitor, pulse oximetry, supplemental oxygen.  Sedation given: midazolam Probe inserted without difficulty.  Evaluation Findings: See transesophageal echocardiogram report Complications: None Patient did tolerate procedure well  Findings: atrial septal aneurysm; otherwise essentially normal; no source of embolism identified.  [name] 04/15/2011   10:39 AM

## 2011-04-15 NOTE — Progress Notes (Signed)
ANTICOAGULATION CONSULT NOTE - Initial Consult  Pharmacy Consult for Warfarin Indication: history of thromboembolic occlusion the left lower extremities   Allergies  Allergen Reactions  . Aspirin Other (See Comments)    Cannot take this medication due to kidney disease  . Ibuprofen Other (See Comments)    Kidney disease    Patient Measurements: Height: 5\' 1"  (154.9 cm) Weight: 149 lb 7.6 oz (67.8 kg) IBW/kg (Calculated) : 47.8   Vital Signs: Temp: 98.7 F (37.1 C) (03/28 0423) Temp src: Oral (03/28 0423) BP: 92/58 mmHg (03/28 0423) Pulse Rate: 114  (03/28 0423)  Labs:  [name] 04/15/11 0404 04/14/11 0544 04/12/11 1640  HGB -- -- --  HCT -- -- --  PLT -- -- --  APTT -- -- --  LABPROT 32.4* -- 25.8*  INR 3.10* -- 2.31*  HEPARINUNFRC -- -- --  CREATININE -- 1.35* --  CKTOTAL -- -- --  CKMB -- -- --  TROPONINI -- -- --   Estimated Creatinine Clearance: 48.3 ml/min (by C-G formula based on Cr of 1.35).  Medical History: Past Medical History  Diagnosis Date  . Shingles     11/2010  . Cellulitis     of face - 12/2010  . Peripheral vascular disease in diabetes mellitus   . Hyperlipidemia   . Tobacco abuse     0.5 - 1ppd x 21 yrs  . Headache   . Renal disorder     glomerulonephritis  . Blood transfusion   . Anxiety   . Depression   . Peripheral vascular disease   . Diabetes mellitus     IDDM Since Age 69  . Embolism - blood clot   . Renal insufficiency     Medications:  Scheduled:    . amLODipine  2.5 mg Oral Daily  . ciprofloxacin  500 mg Oral BID  . clopidogrel  75 mg Oral Q breakfast  . cyanocobalamin  1,000 mcg Intramuscular Once  . dextrose      . furosemide  20 mg Oral 3 times weekly  . insulin aspart  0-9 Units Subcutaneous TID WC  . insulin aspart  5 Units Subcutaneous TID WC  . insulin glargine  15 Units Subcutaneous QHS  . mulitivitamin with minerals  1 tablet Oral Daily  . omega-3 acid ethyl esters  1 g Oral Daily  . oxyCODONE  15 mg  Oral Q6H  . pantoprazole  40 mg Oral Q1200  . potassium chloride SA  20 mEq Oral TID  . sertraline  50 mg Oral Daily  . simvastatin  20 mg Oral q1800  . sodium chloride      . vancomycin  1,250 mg Intravenous Q24H  . warfarin  3 mg Oral q1800  . Warfarin - Pharmacist Dosing Inpatient   Does not apply q1800  . DISCONTD: insulin aspart  10 Units Subcutaneous TID WC  . DISCONTD: insulin aspart  8 Units Subcutaneous TID WC  . DISCONTD: insulin glargine  20 Units Subcutaneous QHS    Assessment: Okay for Protocol. Elevated INR. Plavix started recently. Home dose = 3mg  daily.  Goal of Therapy:  INR 2-3   Plan:  Discussed with Dr. Luan Pulling.  Change warfarin to pharmacy protocol. Warfarin 0.5mg  IV x 1 today. Continue Daily PT/INR.  [name] 04/15/2011,9:16 AM

## 2011-04-15 NOTE — Progress Notes (Signed)
NAMECATRENA, THURN                ACCOUNT NO.:  [id number]  MEDICAL RECORD NO.:  [id number]  LOCATION:                                 FACILITY:  PHYSICIAN:  [name], MD DATE OF BIRTH:  [birth year]  DATE OF PROCEDURE:  04/14/2011 DATE OF DISCHARGE:                                PROGRESS NOTE   CHIEF COMPLAINT:  Followup for uncontrolled type 1 diabetes.  OBJECTIVE:  She had an unpredictable random hypoglycemia, mainly due to inadequate oral intake.  This afternoon, she did not eat even 25% of her dinner and she states that is enough.  SUBJECTIVE:  GENERAL:  She is not in acute distress.  She looks in pain. VITAL SIGNS:  Blood pressure of 119/74, pulse rate is 99, temperature 97.9. HEENT:  Well hydrated.  She is pale looking.  No icterus. CHEST:  Clear to auscultation bilaterally. CARDIOVASCULAR:  Normal S1 and S2.  No murmur.  No gallop. ABDOMEN:  Soft and nontender. EXTREMITIES:  No edema.  Left lower extremity is dressed, affected by osteomyelitis.  Her blood glucose profile for today looks like she is running anywhere from 62-188.  No hyperglycemia today.  ASSESSMENT: 1. Type 1 diabetes. 2. Unpredictable fluctuating glycemic profile. 3. Osteomyelitis of left lower extremity. 4. Peripheral vascular disease.  PLAN:  We will lower her Lantus to 15 units at bedtime and lower her NovoLog to 5 units t.i.d. before meals plus sensitive-dose sliding scale only if she eats at least 50% of her meals.  However, as a type 1 diabetic, to prevent DKA, she will need the 15 units of Lantus at bedtime with a snack.  If hyperglycemia becomes a problem, she will benefit from a brief period of IV insulin stabilization.  However, for today, it looks like she can be managed over the floor.         ______________________________ [name], MD    GN/MEDQ  D:  04/14/2011  T:  04/14/2011  Job:  [id number]

## 2011-04-16 DIAGNOSIS — R7309 Other abnormal glucose: Secondary | ICD-10-CM

## 2011-04-16 LAB — GLUCOSE, CAPILLARY
Glucose-Capillary: 40 mg/dL — CL (ref 70–99)
Glucose-Capillary: 65 mg/dL — ABNORMAL LOW (ref 70–99)
Glucose-Capillary: 49 mg/dL — ABNORMAL LOW (ref 70–99)
Glucose-Capillary: 54 mg/dL — ABNORMAL LOW (ref 70–99)
Glucose-Capillary: 102 mg/dL — ABNORMAL HIGH (ref 70–99)
Glucose-Capillary: 69 mg/dL — ABNORMAL LOW (ref 70–99)
Glucose-Capillary: 77 mg/dL (ref 70–99)
Glucose-Capillary: 100 mg/dL — ABNORMAL HIGH (ref 70–99)

## 2011-04-16 LAB — BASIC METABOLIC PANEL
BUN: 8 mg/dL (ref 6–23)
CO2: 22 mEq/L (ref 19–32)
Calcium: 8.3 mg/dL — ABNORMAL LOW (ref 8.4–10.5)
Chloride: 111 mEq/L (ref 96–112)
Creatinine, Ser: 1.27 mg/dL — ABNORMAL HIGH (ref 0.50–1.10)
GFR calc Af Amer: 60 mL/min — ABNORMAL LOW (ref >90)
GFR calc non Af Amer: 52 mL/min — ABNORMAL LOW (ref >90)
Glucose, Bld: 38 mg/dL — CL (ref 70–99)
Potassium: 4.1 mEq/L (ref 3.5–5.1)
Sodium: 140 mEq/L (ref 135–145)

## 2011-04-16 LAB — PROTIME-INR
INR: 3.47 — ABNORMAL HIGH (ref 0.00–1.49)
Prothrombin Time: 35.4 seconds — ABNORMAL HIGH (ref 11.6–15.2)

## 2011-04-16 LAB — VANCOMYCIN, TROUGH: Vancomycin Tr: 24 ug/mL — ABNORMAL HIGH (ref 10.0–20.0)

## 2011-04-16 MED ORDER — SODIUM CHLORIDE 0.9 % IJ SOLN
INTRAMUSCULAR | Status: AC
  Filled 2011-04-16: qty 3

## 2011-04-16 MED ORDER — DEXTROSE 50 % IV SOLN
INTRAVENOUS | Status: AC
  Administered 2011-04-16: 50 mL
  Filled 2011-04-16: qty 50

## 2011-04-16 MED ORDER — INSULIN GLARGINE 100 UNIT/ML ~~LOC~~ SOLN
10.0000 [IU] | Freq: Every day | SUBCUTANEOUS | Status: DC
  Administered 2011-04-17 – 2011-04-18 (×2): 10 [IU] via SUBCUTANEOUS

## 2011-04-16 MED ORDER — DEXTROSE 5 % IV SOLN
INTRAVENOUS | Status: DC
  Administered 2011-04-16 – 2011-04-19 (×5): via INTRAVENOUS

## 2011-04-16 MED ORDER — DEXTROSE 50 % IV SOLN
50.0000 mL | Freq: Once | INTRAVENOUS | Status: AC | PRN
  Administered 2011-04-16: 50 mL via INTRAVENOUS

## 2011-04-16 MED ORDER — VANCOMYCIN HCL IN DEXTROSE 1-5 GM/200ML-% IV SOLN
1000.0000 mg | INTRAVENOUS | Status: DC
  Administered 2011-04-17 – 2011-04-18 (×2): 1000 mg via INTRAVENOUS
  Filled 2011-04-16 (×3): qty 200

## 2011-04-16 MED ORDER — INSULIN ASPART 100 UNIT/ML ~~LOC~~ SOLN
0.0000 [IU] | Freq: Four times a day (QID) | SUBCUTANEOUS | Status: DC
  Administered 2011-04-17: 8 [IU] via SUBCUTANEOUS
  Administered 2011-04-18: 5 [IU] via SUBCUTANEOUS
  Administered 2011-04-18: 11 [IU] via SUBCUTANEOUS
  Administered 2011-04-20: 5 [IU] via SUBCUTANEOUS

## 2011-04-16 NOTE — Progress Notes (Signed)
Glycemic Control Recommendations     Patient with low fasting glucose this am.  Patient with very poor po intake. RN holding Novolog bolus/correction due to patient eating less than 50% per MD order.  Basal dose may be too high.  Dr. Dorris Fetch following and making adjustments accordingly.  Thank you.

## 2011-04-16 NOTE — Progress Notes (Signed)
[name], [name]                ACCOUNT NO.:  [id number]  MEDICAL RECORD NO.:  [id number]  LOCATION:  [id number]                          FACILITY:  [facility]  PHYSICIAN:  [name], MD DATE OF BIRTH:  1970/08/15  DATE OF PROCEDURE:  04/16/2011 DATE OF DISCHARGE:                                PROGRESS NOTE   CHIEF COMPLAINT:  Followup for uncontrolled type 1 diabetes.  SUBJECTIVE:  The patient had persistent hyperglycemic episodes.  Due to the fact that she is not eating very well at this point, she was started on dextrose 5% to support her glycemia.  She is more confused this morning.  Her TEE did not show a source of any embolic processes.  OBJECTIVE:  GENERAL:  The patient is sleepy.  She is pale, not in acute distress. VITAL SIGNS:  Her blood pressure is 122/70, pulse rate is 113, respiratory rate 20, temperature 99.2. HEENT:  [name]. CHEST:  Clear to auscultation bilaterally. CARDIOVASCULAR:  Slightly tachycardic.  No murmur, no gallop. ABDOMEN:  Soft, nontender. EXTREMITIES:  Left lower extremity is cold and ischemic with no pulse.  LABORATORY DATA:  Her glycemic profile shows that at midnight yesterday she was 141.  She received 15 units of glargine, woke with hypoglycemia at 38, which was difficult to raise above 70, at which time she was started on dextrose 5%, currently running at 60 mL per hour.  ASSESSMENT: 1. Type 1 diabetes, chronically uncontrolled. 2. Hypoglycemic episodes. 3. Osteomyelitis and gangrene of the left lower extremity. 4. Severe peripheral vascular disease.  PLAN:  Continue dextrose 5% at a higher flow rate of 75 mL per hour. Continue Accu-Cheks every 6 hours and use moderate dose sliding scale NovoLog to cover hyperglycemia above 200.  She would still need Lantus 10 units at bedtime.  If she runs glycemic profile above 250 x3, she will need to be transferred to ICU for a glucose stabilizer.  The patient is on ongoing treatment with  antibiotics for osteomyelitis.  She is not cooperating with oral feeding due to her illness.  If she starts to tolerate more feeding, we will resume prandial insulin appropriately.          ______________________________ [name], MD     GN/MEDQ  D:  04/16/2011  T:  04/16/2011  Job:  [id number]

## 2011-04-16 NOTE — Progress Notes (Signed)
[name], [name]                ACCOUNT NO.:  [id number]  MEDICAL RECORD NO.:  [id number]  LOCATION:  [id number]                          FACILITY:  [facility]  PHYSICIAN:  Elvia Aydin A. Merlene Laughter, M.D. DATE OF BIRTH:  [birth year]  DATE OF PROCEDURE: DATE OF DISCHARGE:                                PROGRESS NOTE   The patient overall about the same, continues to be drowsy.  She wants to go home, but will need assistance.  Will likely need to go to a skilled facility.  Continues to complain of left ankle pain.  She again is dozing, rather easily arousable, although she dozes off during the evaluation.  Pupils are reactive.  Extraocular movements are full. Facial muscle strength is symmetric.  She has good strength on the extremities except the left lower extremity which is weak from pain. 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  So far, no clear evidence of where this may have emanated from.  She possibly may have had aortic arch issue.  EEG has being done. We will follow results.  From my standpoint, I think we will continue warfarin for as long as vascular surgeon needs her to beyond that. Again, from my vantage point, she does not need to be on the warfarin indefinitely.  I will suggest continue with Plavix for 3 months.     Ambrosia Wisnewski A. Merlene Laughter, M.D.     KAD/MEDQ  D:  04/16/2011  T:  04/16/2011  Job:  [id number]

## 2011-04-16 NOTE — Progress Notes (Signed)
At 1830, px BG was taken and it was 69. Gave px a coke along with graham crackers and peanut butter. Px BG was retaken at 0655 and it is 77. Encouraged px to continue to eat the peanut butter and crackers as well as drinking coke. Walker Kehr, RN, MSN

## 2011-04-16 NOTE — Progress Notes (Signed)
Rechecked px BG per protocol and it was 49. Gave 50 mL of D50 per protocol and notified Dr. Luan Pulling of results. No further orders at this point. Will continue to follow protocol until further notice. Walker Kehr, RN, MSN

## 2011-04-16 NOTE — Progress Notes (Signed)
[name]  41 y.o.  female  Subjective: Mental status remains impaired with somnolence and pseudo-bulbar affect; however, she was oriented to person, place and the week. The patient has been confined to bed, mostly as a result of her mental status and severe pain in the left foot with even gentle palpation. She reportedly has not received much in the way of narcotics.  Hypoglycemia has been persistent this morning requiring modification of her medical regime for diabetes and administration of D5W.  Allergy: Aspirin and Ibuprofen  Objective: Vital signs in last 24 hours: Temp:  [98.8 F (37.1 C)-99.2 F (37.3 C)] 99.2 F (37.3 C) (03/29 0458) Pulse Rate:  [97-113] 113  (03/29 0458) Resp:  [20] 20  (03/29 0458) BP: (101-122)/(61-74) 122/70 mmHg (03/29 0458) SpO2:  [95 %-97 %] 97 % (03/29 0458)  67.8 kg (149 lb 7.6 oz) Body mass index is 28.24 kg/(m^2).  Weight change:  Last BM Date: 04/15/11  Intake/Output from previous day: 03/28 0701 - 03/29 0700 In: 2640 [P.O.:360; I.V.:2280] Out: 950 [Urine:950] Net I&O +22 L since admission with a 20+ pound weight gain  General- Well developed; no acute distress  Neck- [name], faint left carotid bruit versus transmitted murmur Lungs- clear lung fields; normal I:E ratio Cardiovascular- normal PMI; normal S1 and S2; scratchy grade [id number] systolic ejection murmur at the left sternal border Abdomen- normal bowel sounds; soft and non-tender without masses or organomegaly Skin- Warm, no significant lesions Neurologic- normal cranial nerves; symmetric strength and tone in the upper extremities Extremities-  no edema  BMET:  Basename 04/16/11 0547 04/14/11 0544  NA 140 136  K 4.1 4.5  CL 111 108  CO2 22 23  GLUCOSE 38* 171*  BUN 8 12  CREATININE 1.27* 1.35*  CALCIUM 8.3* 7.9*   Imaging Studies/Results: Dg Hip Complete Left  04/15/2011  *RADIOLOGY REPORT*  Clinical Data: Left hip pain following a fall.  LEFT HIP - COMPLETE 2+ VIEW   Comparison: None.  Findings: Normal appearing left hip and pelvic bones.  No fracture or dislocation.  IMPRESSION: Normal examination.  Original Report Authenticated By: Gerald Stabs, M.D.   US Carotid Duplex Bilateral  04/14/2011  *RADIOLOGY REPORT*  Clinical Data: Acute CVA.  BILATERAL CAROTID DUPLEX ULTRASOUND  Technique: Pearline Cables scale imaging, color Doppler and duplex ultrasound was performed of bilateral carotid and vertebral arteries in the neck.  Comparison:  MRI 04/12/2011  Criteria:  Quantification of carotid stenosis is based on velocity parameters that correlate the residual internal carotid diameter with NASCET-based stenosis levels, using the diameter of the distal internal carotid lumen as the denominator for stenosis measurement.  The following velocity measurements were obtained:                   PEAK SYSTOLIC/END DIASTOLIC RIGHT ICA:                        115cm/sec CCA:                        [id number] SYSTOLIC ICA/CCA RATIO:     1.1 DIASTOLIC ICA/CCA RATIO:    1.4 ECA:                        156cm/sec  LEFT ICA:                        122cm/sec CCA:  [id number] SYSTOLIC ICA/CCA RATIO:     0.8 DIASTOLIC ICA/CCA RATIO:    1.5 ECA:                        168cm/sec  Findings:  RIGHT CAROTID ARTERY: Intimal thickening in the right common carotid artery.  A small amount of plaque in the distal carotid bulb.  Minimal plaque in the proximal right internal carotid artery.  Normal velocities and waveforms in the right internal carotid artery.  RIGHT VERTEBRAL ARTERY:  Antegrade flow and normal waveform in the right vertebral artery.  LEFT CAROTID ARTERY: There is some intimal thickening in the left common carotid artery.  Left carotid arteries are patent without significant plaque or stenosis.  LEFT VERTEBRAL ARTERY:  Antegrade flow and normal waveform in the left vertebral artery.  IMPRESSION: Minimal atherosclerotic disease in the carotid arteries.  No significant carotid artery  stenosis.  Estimated degree of stenosis in the internal carotid arteries is less than 50% bilaterally.  Original Report Authenticated By: Markus Daft, M.D.    Imaging: Imaging results have been reviewed  Medications:  I have reviewed the patient's current medications. Scheduled:   . amLODipine  2.5 mg Oral Daily  . ciprofloxacin  500 mg Oral BID  . clopidogrel  75 mg Oral Q breakfast  . dextrose      . furosemide  20 mg Oral 3 times weekly  . insulin aspart  0-15 Units Subcutaneous Q6H  . insulin glargine  10 Units Subcutaneous QHS  . midazolam      . mulitivitamin with minerals  1 tablet Oral Daily  . omega-3 acid ethyl esters  1 g Oral Daily  . oxyCODONE  15 mg Oral Q6H  . pantoprazole  40 mg Oral Q1200  . potassium chloride SA  20 mEq Oral TID  . sertraline  50 mg Oral Daily  . simvastatin  20 mg Oral q1800  . vancomycin  1,000 mg Intravenous Q24H  . warfarin  0.5 mg Oral ONCE-1800  . Warfarin - Pharmacist Dosing Inpatient   Does not apply q1800  Medication history reviewed. Patient has received oxycodone 4 times per day around the clock.   Assessment/Plan: Cerebral emboli: No cardiac source identified. All areas of infarction are on the right side of the brain. Despite finding minimal atherosclerosis in the thoracic aorta and the carotid arteries on a duplex examination, MRI/MRA indicated moderate stenosis of both carotids in the cavernous portion of the vessel. Perhaps emboli originated from the right carotid. This would be consistent with the small size of the infarctions and the absence of significant focal abnormalities on neurologic examination.  Impaired mental status could be related to narcotics, and an attempt to decrease the dosage of sedating medication should be considered.  Left lower extremity pain-MRI findings were not impressive and not diagnostic for osteomyelitis. Adequate perfusion should be demonstrated, as her discomfort could be related to ischemia. Decreasing  her intake of narcotics might improve mental status as noted above.  Cardiology will be available as needed.   LOS: 11 days   [name] 04/16/2011, 2:11 PM

## 2011-04-16 NOTE — Progress Notes (Signed)
   CARE MANAGEMENT NOTE 04/16/2011  Patient:  Lisa, [name]   Account Number:  [id number]  Date Initiated:  04/07/2011  Documentation initiated by:  Theophilus Kinds  Subjective/Objective Assessment:   Pt admitted from home with recurrent cellulitis. 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 Possible osteomylitis. Pt lives with mother and fiance. Pt is able to afford meds. Active with [facility].     Action/Plan:   CM spoke with pt about possible need for IV AB at discharge. Pain control is also an issue at this point.   Anticipated DC Date:  04/14/2011   Anticipated DC Plan:  [place]  In-house referral  Clinical Social Worker      DC Planning Services  CM consult      Choice offered to / List presented to:             [place].   Status of service:  In process, will continue to follow Medicare Important Message given?   (If response is "NO", the following Medicare IM given date fields will be blank) Date Medicare IM given:   Date Additional Medicare IM given:    Discharge Disposition:  [place]  Per UR Regulation:    If discussed at Long Length of Stay Meetings, dates discussed:   04/15/2011    Comments:  04/16/11 [place], RN BSN CM CM spoke with pts daughter, Vikki Ports, who states that pt is now for SNF. At this point, the daughter is unable to take care of pt and pts mother who has dementia. However, pt daughter states that she only wants this short term until her mother can get IV AB and PT. MD and CSW is aware. CSW will complete paperwork and look for SNF bed.  04/09/11 [place], RN BSN CM Discussed the need for short term placement for IV AB therapy as recommended by Dr. Luan Pulling. Pt is agreeable. CSW made aware.  04/07/11 [place], RN BSN CM Pt admitted from home with cellulitis and possible osteomylitis. Pt already active with [facility]. Will resume services at  discharge.

## 2011-04-16 NOTE — Progress Notes (Signed)
Contacted Dr. Dorris Fetch about px fluctuations with last BG check being 54.  Followed protocol of giving D50 50 mL and MD changed IV fluids to Dextrose 5% @ 60 mL/hr, decreased Lantus 10 units at HS, discontinued the scheduled Novolog 5 units and changed the sliding scale to moderate dose to cover hyperglycemia above 200. Will continue to monitor patient and implement orders. Walker Kehr, RN, MSN

## 2011-04-16 NOTE — Progress Notes (Signed)
Pt is very drowsy, but will awaken.  Her foot was exposed and the forefoot appears to be necrotic.  She is still having excruciating pain in that foot, so we did not attempt to do any mobility today.

## 2011-04-16 NOTE — Brief Op Note (Signed)
04/05/2011 - 04/15/2011  10:59 AM  PATIENT:  [name]  41 y.o. female  PRE-OPERATIVE DIAGNOSIS:  cerebral infarction  POST-OPERATIVE DIAGNOSIS:  Stroke  PROCEDURE:  Procedure(s) (LRB): TRANSESOPHAGEAL ECHOCARDIOGRAM (TEE) (N/A)  SURGEON:  Surgeon(s) and Role:    * [name], MD - Primary  PHYSICIAN ASSISTANT:   ASSISTANTS: none   ANESTHESIA:   local  EBL:  Total I/O In: 240 [P.O.:240] Out: -   BLOOD ADMINISTERED:none  DRAINS: none   LOCAL MEDICATIONS USED:  LIDOCAINE  and NONE  SPECIMEN:  No Specimen  DISPOSITION OF SPECIMEN:  N/A  COUNTS:  YES  TOURNIQUET:  * No tourniquets in log *  DICTATION: .Dragon Dictation  PLAN OF CARE: Continue inpatient admission  PATIENT DISPOSITION:  PACU - hemodynamically stable.   Delay start of Pharmacological VTE agent (>24hrs) due to surgical blood loss or risk of bleeding: not applicable

## 2011-04-16 NOTE — Progress Notes (Signed)
ANTIBIOTIC CONSULT NOTE   Pharmacy Consult for Vancomycin and Zosyn Indication: cellulitis of face vs osteomyelitis of foot  Allergies  Allergen Reactions  . Aspirin Other (See Comments)    Cannot take this medication due to kidney disease  . Ibuprofen Other (See Comments)    Kidney disease   Patient Measurements: Height: 5\' 1"  (154.9 cm) Weight: 149 lb 7.6 oz (67.8 kg) IBW/kg (Calculated) : 47.8   Vital Signs: Temp: 99.2 F (37.3 C) (03/29 0458) Temp src: Oral (03/29 0458) BP: 122/70 mmHg (03/29 0458) Pulse Rate: 113  (03/29 0458) Intake/Output from previous day: 03/28 0701 - 03/29 0700 In: 2640 [P.O.:360; I.V.:2280] Out: 950 [Urine:950] Intake/Output from this shift: Total I/O In: 240 [P.O.:240] Out: -   Labs:  Basename 04/16/11 0547 04/14/11 0544  WBC -- --  HGB -- --  PLT -- --  LABCREA -- --  CREATININE 1.27* 1.35*   Estimated Creatinine Clearance: 51.4 ml/min (by C-G formula based on Cr of 1.27).  Basename 04/16/11 0732  VANCOTROUGH 24.0*  VANCOPEAK --  Jake Michaelis --  GENTTROUGH --  GENTPEAK --  GENTRANDOM --  TOBRATROUGH --  TOBRAPEAK --  TOBRARND --  AMIKACINPEAK --  AMIKACINTROU --  AMIKACIN --    Microbiology: Recent Results (from the past 720 hour(s))  MRSA PCR SCREENING     Status: Normal   Collection Time   03/28/11 12:00 AM      Component Value Range Status Comment   MRSA by PCR NEGATIVE  NEGATIVE  Final   URINE CULTURE     Status: Normal   Collection Time   03/29/11  7:30 PM      Component Value Range Status Comment   Specimen Description URINE, CLEAN CATCH   Final    Special Requests NONE   Final    Culture  Setup Time [id number]   Final    Colony Count NO GROWTH   Final    Culture NO GROWTH   Final    Report Status 03/30/2011 FINAL   Final   CULTURE, BLOOD (ROUTINE X 2)     Status: Normal   Collection Time   03/31/11 10:55 AM      Component Value Range Status Comment   Specimen Description BLOOD LEFT HAND   Final    Special Requests BOTTLES DRAWN AEROBIC ONLY 4CC   Final    Culture NO GROWTH 5 DAYS   Final    Report Status 04/05/2011 FINAL   Final   MRSA PCR SCREENING     Status: Normal   Collection Time   04/05/11 10:42 PM      Component Value Range Status Comment   MRSA by PCR NEGATIVE  NEGATIVE  Final   CULTURE, BLOOD (ROUTINE X 2)     Status: Normal (Preliminary result)   Collection Time   04/13/11  6:34 PM      Component Value Range Status Comment   Specimen Description BLOOD LEFT WRIST   Final    Special Requests     Final    Value: BOTTLES DRAWN AEROBIC AND ANAEROBIC AEB=9CC ANA=4CC   Culture NO GROWTH 2 DAYS   Final    Report Status PENDING   Incomplete    Medical History: Past Medical History  Diagnosis Date  . Shingles     11/2010  . Cellulitis     of face - 12/2010  . Peripheral vascular disease in diabetes mellitus   . Hyperlipidemia   . Tobacco abuse  0.5 - 1ppd x 21 yrs  . Headache   . Renal disorder     glomerulonephritis  . Blood transfusion   . Anxiety   . Depression   . Peripheral vascular disease   . Diabetes mellitus     IDDM Since Age 71  . Embolism - blood clot   . Renal insufficiency    Medications:  Scheduled:     . amLODipine  2.5 mg Oral Daily  . amLODipine  2.5 mg Oral Once  . ciprofloxacin  500 mg Oral BID  . clopidogrel  75 mg Oral Q breakfast  . furosemide  20 mg Oral 3 times weekly  . insulin aspart  0-9 Units Subcutaneous TID WC  . insulin aspart  5 Units Subcutaneous TID WC  . insulin glargine  15 Units Subcutaneous QHS  . midazolam      . mulitivitamin with minerals  1 tablet Oral Daily  . mulitivitamin with minerals  1 tablet Oral Once  . omega-3 acid ethyl esters  1 g Oral Daily  . omega-3 acid ethyl esters  1 g Oral Once  . oxyCODONE  15 mg Oral Q6H  . oxyCODONE  15 mg Oral Once  . pantoprazole  40 mg Oral Q1200  . potassium chloride SA  20 mEq Oral TID  . potassium chloride  20 mEq Oral Once  . sertraline  50 mg Oral Daily  .  sertraline  50 mg Oral Once  . simvastatin  20 mg Oral q1800  . sodium chloride      . vancomycin  1,000 mg Intravenous Q24H  . warfarin  0.5 mg Oral ONCE-1800  . Warfarin - Pharmacist Dosing Inpatient   Does not apply q1800  . DISCONTD: fentaNYL  250 mcg Intravenous Once  . DISCONTD: midazolam  10 mg Intravenous Once  . DISCONTD: sodium chloride  3 mL Intravenous Q12H  . DISCONTD: vancomycin  1,250 mg Intravenous Q24H   Assessment: Ok for protocol Trough slightly above goal  Goal of Therapy:  Vancomycin trough level 15-20 mcg/ml  Plan: Decrease Vancomycin to 1000mg  iv q24 hours Zosyn 3.375 GM IV every 8 hours Monitor labs per protocol  [name] 04/16/2011,10:18 AM

## 2011-04-16 NOTE — Progress Notes (Signed)
[place] for Warfarin Indication: history of thromboembolic occlusion the left lower extremities   Allergies  Allergen Reactions  . Aspirin Other (See Comments)    Cannot take this medication due to kidney disease  . Ibuprofen Other (See Comments)    Kidney disease   Patient Measurements: Height: 5\' 1"  (154.9 cm) Weight: 149 lb 7.6 oz (67.8 kg) IBW/kg (Calculated) : 47.8   Vital Signs: Temp: 99.2 F (37.3 C) (03/29 0458) Temp src: Oral (03/29 0458) BP: 122/70 mmHg (03/29 0458) Pulse Rate: 113  (03/29 0458)  Labs:  [name] 04/16/11 0547 04/15/11 0404 04/14/11 0544  HGB -- -- --  HCT -- -- --  PLT -- -- --  APTT -- -- --  LABPROT 35.4* 32.4* --  INR 3.47* 3.10* --  HEPARINUNFRC -- -- --  CREATININE 1.27* -- 1.35*  CKTOTAL -- -- --  CKMB -- -- --  TROPONINI -- -- --   Estimated Creatinine Clearance: 51.4 ml/min (by C-G formula based on Cr of 1.27).  Medical History: Past Medical History  Diagnosis Date  . Shingles     11/2010  . Cellulitis     of face - 12/2010  . Peripheral vascular disease in diabetes mellitus   . Hyperlipidemia   . Tobacco abuse     0.5 - 1ppd x 21 yrs  . Headache   . Renal disorder     glomerulonephritis  . Blood transfusion   . Anxiety   . Depression   . Peripheral vascular disease   . Diabetes mellitus     IDDM Since Age 24  . Embolism - blood clot   . Renal insufficiency    Medications:  Scheduled:     . amLODipine  2.5 mg Oral Daily  . amLODipine  2.5 mg Oral Once  . ciprofloxacin  500 mg Oral BID  . clopidogrel  75 mg Oral Q breakfast  . furosemide  20 mg Oral 3 times weekly  . insulin aspart  0-9 Units Subcutaneous TID WC  . insulin aspart  5 Units Subcutaneous TID WC  . insulin glargine  15 Units Subcutaneous QHS  . midazolam      . mulitivitamin with minerals  1 tablet Oral Daily  . mulitivitamin with minerals  1 tablet Oral Once  . omega-3 acid ethyl esters  1 g Oral Daily    . omega-3 acid ethyl esters  1 g Oral Once  . oxyCODONE  15 mg Oral Q6H  . oxyCODONE  15 mg Oral Once  . pantoprazole  40 mg Oral Q1200  . potassium chloride SA  20 mEq Oral TID  . potassium chloride  20 mEq Oral Once  . sertraline  50 mg Oral Daily  . sertraline  50 mg Oral Once  . simvastatin  20 mg Oral q1800  . sodium chloride      . vancomycin  1,000 mg Intravenous Q24H  . warfarin  0.5 mg Oral ONCE-1800  . Warfarin - Pharmacist Dosing Inpatient   Does not apply q1800  . DISCONTD: fentaNYL  250 mcg Intravenous Once  . DISCONTD: midazolam  10 mg Intravenous Once  . DISCONTD: sodium chloride  3 mL Intravenous Q12H  . DISCONTD: vancomycin  1,250 mg Intravenous Q24H   Assessment: Okay for Protocol. Elevated INR. Plavix started recently. Home dose = 3mg  daily.  Goal of Therapy:  INR 2-3   Plan: NO coumadin today Continue Daily PT/INR.  [place] Crane, Mahum Betten A 04/16/2011,10:21 AM

## 2011-04-16 NOTE — Progress Notes (Signed)
Subjective: Interval History:   Objective: Vital signs in last 24 hours: Temp:  [98.1 F (36.7 C)-99.2 F (37.3 C)] 99.2 F (37.3 C) (03/29 0458) Pulse Rate:  [90-113] 113  (03/29 0458) Resp:  [7-20] 20  (03/29 0458) BP: (100-140)/(45-82) 122/70 mmHg (03/29 0458) SpO2:  [92 %-100 %] 97 % (03/29 0458)  Intake/Output from previous day: 03/28 0701 - 03/29 0700 In: 2640 [P.O.:360; I.V.:2280] Out: 950 [Urine:950] Intake/Output this shift:   Nutritional status: Carb Control   Lab Results:  Basename 04/16/11 0547 04/14/11 0544  WBC -- --  HGB -- --  HCT -- --  PLT -- --  NA 140 136  K 4.1 4.5  CL 111 108  CO2 22 23  GLUCOSE 38* 171*  BUN 8 12  CREATININE 1.27* 1.35*  CALCIUM 8.3* 7.9*  LABA1C -- --   Lipid Panel No results found for this basename: CHOL,TRIG,HDL,CHOLHDL,VLDL,LDLCALC in the last 72 hours  Studies/Results: Dg Hip Complete Left  04/15/2011  *RADIOLOGY REPORT*  Clinical Data: Left hip pain following a fall.  LEFT HIP - COMPLETE 2+ VIEW  Comparison: None.  Findings: Normal appearing left hip and pelvic bones.  No fracture or dislocation.  IMPRESSION: Normal examination.  Original Report Authenticated By: Gerald Stabs, M.D.   US Carotid Duplex Bilateral  04/14/2011  *RADIOLOGY REPORT*  Clinical Data: Acute CVA.  BILATERAL CAROTID DUPLEX ULTRASOUND  Technique: Pearline Cables scale imaging, color Doppler and duplex ultrasound was performed of bilateral carotid and vertebral arteries in the neck.  Comparison:  MRI 04/12/2011  Criteria:  Quantification of carotid stenosis is based on velocity parameters that correlate the residual internal carotid diameter with NASCET-based stenosis levels, using the diameter of the distal internal carotid lumen as the denominator for stenosis measurement.  The following velocity measurements were obtained:                   PEAK SYSTOLIC/END DIASTOLIC RIGHT ICA:                        115cm/sec CCA:                        [id number] SYSTOLIC  ICA/CCA RATIO:     1.1 DIASTOLIC ICA/CCA RATIO:    1.4 ECA:                        156cm/sec  LEFT ICA:                        122cm/sec CCA:                        [id number] SYSTOLIC ICA/CCA RATIO:     0.8 DIASTOLIC ICA/CCA RATIO:    1.5 ECA:                        168cm/sec  Findings:  RIGHT CAROTID ARTERY: Intimal thickening in the right common carotid artery.  A small amount of plaque in the distal carotid bulb.  Minimal plaque in the proximal right internal carotid artery.  Normal velocities and waveforms in the right internal carotid artery.  RIGHT VERTEBRAL ARTERY:  Antegrade flow and normal waveform in the right vertebral artery.  LEFT CAROTID ARTERY: There is some intimal thickening in the left common carotid artery.  Left carotid arteries are patent without significant plaque  or stenosis.  LEFT VERTEBRAL ARTERY:  Antegrade flow and normal waveform in the left vertebral artery.  IMPRESSION: Minimal atherosclerotic disease in the carotid arteries.  No significant carotid artery stenosis.  Estimated degree of stenosis in the internal carotid arteries is less than 50% bilaterally.  Original Report Authenticated By: Markus Daft, M.D.    Medications:  Scheduled Meds:    . amLODipine  2.5 mg Oral Daily  . amLODipine  2.5 mg Oral Once  . ciprofloxacin  500 mg Oral BID  . clopidogrel  75 mg Oral Q breakfast  . cyanocobalamin  1,000 mcg Intramuscular Once  . furosemide  20 mg Oral 3 times weekly  . insulin aspart  0-9 Units Subcutaneous TID WC  . insulin aspart  5 Units Subcutaneous TID WC  . insulin glargine  15 Units Subcutaneous QHS  . midazolam      . mulitivitamin with minerals  1 tablet Oral Daily  . mulitivitamin with minerals  1 tablet Oral Once  . omega-3 acid ethyl esters  1 g Oral Daily  . omega-3 acid ethyl esters  1 g Oral Once  . oxyCODONE  15 mg Oral Q6H  . oxyCODONE  15 mg Oral Once  . pantoprazole  40 mg Oral Q1200  . potassium chloride SA  20 mEq Oral TID  . potassium  chloride  20 mEq Oral Once  . sertraline  50 mg Oral Daily  . sertraline  50 mg Oral Once  . simvastatin  20 mg Oral q1800  . sodium chloride      . vancomycin  1,250 mg Intravenous Q24H  . warfarin  0.5 mg Oral ONCE-1800  . Warfarin - Pharmacist Dosing Inpatient   Does not apply q1800  . DISCONTD: fentaNYL  250 mcg Intravenous Once  . DISCONTD: fentaNYL  250 mcg Intravenous Once  . DISCONTD: midazolam  10 mg Intravenous Once  . DISCONTD: midazolam  10 mg Intravenous Once  . DISCONTD: sodium chloride  3 mL Intravenous Q12H  . DISCONTD: warfarin  3 mg Oral q1800   Continuous Infusions:    . sodium chloride 75 mL/hr at 04/15/11 2230  . DISCONTD: sodium chloride    . DISCONTD: sodium chloride     PRN Meds:.acetaminophen, ALPRAZolam, dextrose, dextrose, dextrose, diphenhydrAMINE, morphine injection, ondansetron, DISCONTD: sodium chloride, DISCONTD: benzocaine, DISCONTD: benzocaine, DISCONTD: midazolam, DISCONTD: sodium chloride   Assessment/Plan: See dictation   LOS: 11 days   [name]

## 2011-04-16 NOTE — Progress Notes (Signed)
Patient ID: [name], female   DOB: 1970/12/04, 41 y.o.   MRN: [id number]

## 2011-04-16 NOTE — Progress Notes (Signed)
Subjective: She has been hypoglycemic this morning which is being treated. She has been more confused. She still has pain in her foot. She has no other new complaints. She did have a transesophageal echocardiogram yesterday but it does not show a source of embolic phenomenon  Objective: Vital signs in last 24 hours: Temp:  [98.8 F (37.1 C)-99.2 F (37.3 C)] 99.2 F (37.3 C) (03/29 0458) Pulse Rate:  [97-113] 113  (03/29 0458) Resp:  [20] 20  (03/29 0458) BP: (101-122)/(61-74) 122/70 mmHg (03/29 0458) SpO2:  [95 %-97 %] 97 % (03/29 0458) Weight change:  Last BM Date: 04/15/11  Intake/Output from previous day: 03/28 0701 - 03/29 0700 In: 2640 [P.O.:360; I.V.:2280] Out: 950 [Urine:950]  PHYSICAL EXAM General appearance: alert, cooperative and mild distress Resp: clear to auscultation bilaterally Cardio: regular rate and rhythm, S1, S2 normal, no murmur, click, rub or gallop GI: soft, non-tender; bowel sounds normal; no masses,  no organomegaly Extremities: Her foot is cool but does have a pulse. The areas of gangrene are unchanged  Lab Results:    Basic Metabolic Panel:  Basename 04/16/11 0547 04/14/11 0544  NA 140 136  K 4.1 4.5  CL 111 108  CO2 22 23  GLUCOSE 38* 171*  BUN 8 12  CREATININE 1.27* 1.35*  CALCIUM 8.3* 7.9*  MG -- --  PHOS -- --   Liver Function Tests: No results found for this basename: AST:2,ALT:2,ALKPHOS:2,BILITOT:2,PROT:2,ALBUMIN:2 in the last 72 hours No results found for this basename: LIPASE:2,AMYLASE:2 in the last 72 hours  Basename 04/15/11 1102  AMMONIA 23   CBC: No results found for this basename: WBC:2,NEUTROABS:2,HGB:2,HCT:2,MCV:2,PLT:2 in the last 72 hours Cardiac Enzymes: No results found for this basename: CKTOTAL:3,CKMB:3,CKMBINDEX:3,TROPONINI:3 in the last 72 hours BNP: No results found for this basename: PROBNP:3 in the last 72 hours D-Dimer: No results found for this basename: DDIMER:2 in the last 72 hours CBG:  Basename  04/16/11 0958 04/16/11 0859 04/16/11 0758 04/16/11 0720 04/15/11 2051 04/15/11 1728  GLUCAP 54* 49* 65* 40* 141* 136*   Hemoglobin A1C: No results found for this basename: HGBA1C in the last 72 hours Fasting Lipid Panel: No results found for this basename: CHOL,HDL,LDLCALC,TRIG,CHOLHDL,LDLDIRECT in the last 72 hours Thyroid Function Tests:  Basename 04/14/11 0500  TSH 6.633*  T4TOTAL --  FREET4 --  T3FREE --  THYROIDAB --   Anemia Panel:  Basename 04/14/11 0500  VITAMINB12 226  FOLATE --  FERRITIN --  TIBC --  IRON --  RETICCTPCT --   Coagulation:  Basename 04/16/11 0547 04/15/11 0404  LABPROT 35.4* 32.4*  INR 3.47* 3.10*   Urine Drug Screen: Drugs of Abuse     Component Value Date/Time   LABOPIA NONE DETECTED 03/21/2010 2130   COCAINSCRNUR POSITIVE* 03/21/2010 2130   LABBENZ NONE DETECTED 03/21/2010 2130   AMPHETMU NONE DETECTED 03/21/2010 2130   THCU NONE DETECTED 03/21/2010 2130   LABBARB  Value: NONE DETECTED        DRUG SCREEN FOR MEDICAL PURPOSES ONLY.  IF CONFIRMATION IS NEEDED FOR ANY PURPOSE, NOTIFY LAB WITHIN 5 DAYS.        LOWEST DETECTABLE LIMITS FOR URINE DRUG SCREEN Drug Class       Cutoff (ng/mL) Amphetamine      1000 Barbiturate      200 Benzodiazepine   [id number] Tricyclics       [id number] Opiates          300 Cocaine          300 THC  50 03/21/2010 2130    Alcohol Level: No results found for this basename: ETH:2 in the last 72 hours Urinalysis: No results found for this basename: COLORURINE:2,APPERANCEUR:2,LABSPEC:2,PHURINE:2,GLUCOSEU:2,HGBUR:2,BILIRUBINUR:2,KETONESUR:2,PROTEINUR:2,UROBILINOGEN:2,NITRITE:2,LEUKOCYTESUR:2 in the last 72 hours Misc. Labs:  ABGS No results found for this basename: PHART,PCO2,PO2ART,TCO2,HCO3 in the last 72 hours CULTURES Recent Results (from the past 240 hour(s))  CULTURE, BLOOD (ROUTINE X 2)     Status: Normal (Preliminary result)   Collection Time   04/13/11  6:34 PM      Component Value Range Status Comment   Specimen  Description BLOOD LEFT WRIST   Final    Special Requests     Final    Value: BOTTLES DRAWN AEROBIC AND ANAEROBIC AEB=9CC ANA=4CC   Culture NO GROWTH 2 DAYS   Final    Report Status PENDING   Incomplete    Studies/Results: Dg Hip Complete Left  04/15/2011  *RADIOLOGY REPORT*  Clinical Data: Left hip pain following a fall.  LEFT HIP - COMPLETE 2+ VIEW  Comparison: None.  Findings: Normal appearing left hip and pelvic bones.  No fracture or dislocation.  IMPRESSION: Normal examination.  Original Report Authenticated By: Gerald Stabs, M.D.   US Carotid Duplex Bilateral  04/14/2011  *RADIOLOGY REPORT*  Clinical Data: Acute CVA.  BILATERAL CAROTID DUPLEX ULTRASOUND  Technique: Pearline Cables scale imaging, color Doppler and duplex ultrasound was performed of bilateral carotid and vertebral arteries in the neck.  Comparison:  MRI 04/12/2011  Criteria:  Quantification of carotid stenosis is based on velocity parameters that correlate the residual internal carotid diameter with NASCET-based stenosis levels, using the diameter of the distal internal carotid lumen as the denominator for stenosis measurement.  The following velocity measurements were obtained:                   PEAK SYSTOLIC/END DIASTOLIC RIGHT ICA:                        115cm/sec CCA:                        [id number] SYSTOLIC ICA/CCA RATIO:     1.1 DIASTOLIC ICA/CCA RATIO:    1.4 ECA:                        156cm/sec  LEFT ICA:                        122cm/sec CCA:                        [id number] SYSTOLIC ICA/CCA RATIO:     0.8 DIASTOLIC ICA/CCA RATIO:    1.5 ECA:                        168cm/sec  Findings:  RIGHT CAROTID ARTERY: Intimal thickening in the right common carotid artery.  A small amount of plaque in the distal carotid bulb.  Minimal plaque in the proximal right internal carotid artery.  Normal velocities and waveforms in the right internal carotid artery.  RIGHT VERTEBRAL ARTERY:  Antegrade flow and normal waveform in the right vertebral  artery.  LEFT CAROTID ARTERY: There is some intimal thickening in the left common carotid artery.  Left carotid arteries are patent without significant plaque or stenosis.  LEFT VERTEBRAL ARTERY:  Antegrade flow and normal waveform in the left vertebral artery.  IMPRESSION: Minimal atherosclerotic disease in the carotid arteries.  No significant carotid artery stenosis.  Estimated degree of stenosis in the internal carotid arteries is less than 50% bilaterally.  Original Report Authenticated By: Markus Daft, M.D.    Medications:  Prior to Admission:  Prescriptions prior to admission  Medication Sig Dispense Refill  . acetaminophen (TYLENOL) 500 MG tablet Take 1,000 mg by mouth every 6 (six) hours as needed. For pain       . ALPRAZolam (XANAX) 1 MG tablet Take 1 mg by mouth daily as needed. For anxiety      . amLODipine (NORVASC) 2.5 MG tablet Take 1 tablet (2.5 mg total) by mouth daily.  30 tablet  5  . cephALEXin (KEFLEX) 500 MG capsule Take 500 mg by mouth 3 (three) times daily.      . diphenhydrAMINE (BENADRYL) 25 MG tablet Take 25 mg by mouth at bedtime as needed. For sleep      . fish oil-omega-3 fatty acids 1000 MG capsule Take 1 g by mouth daily.      . furosemide (LASIX) 20 MG tablet Take 20 mg by mouth 3 (three) times a week. On Mondays, Wednesdays, and Fridays      . insulin aspart (NOVOLOG) 100 UNIT/ML injection Inject 5 Units into the skin 3 (three) times daily before meals.  1 vial  12  . insulin glargine (LANTUS) 100 UNIT/ML injection Inject 15 Units into the skin at bedtime.  10 mL  12  . Multiple Vitamins-Minerals (MULTIVITAMINS THER. W/MINERALS) TABS Take 1 tablet by mouth daily.        . ondansetron (ZOFRAN-ODT) 8 MG disintegrating tablet Take 8 mg by mouth every 12 (twelve) hours as needed. Nausea      . oxyCODONE-acetaminophen (PERCOCET) 10-325 MG per tablet Take 1 tablet by mouth every 6 (six) hours as needed for pain.  30 tablet  0  . potassium chloride SA (K-DUR,KLOR-CON) 20  MEQ tablet Take 0.5 tablets (10 mEq total) by mouth daily.  7 tablet  0  . pravastatin (PRAVACHOL) 40 MG tablet Take 1 tablet (40 mg total) by mouth daily.  30 tablet  5  . sertraline (ZOLOFT) 50 MG tablet Take 50 mg by mouth daily.       Marland Kitchen warfarin (COUMADIN) 3 MG tablet Take 3 mg by mouth daily.       Scheduled:   . amLODipine  2.5 mg Oral Daily  . amLODipine  2.5 mg Oral Once  . ciprofloxacin  500 mg Oral BID  . clopidogrel  75 mg Oral Q breakfast  . dextrose      . furosemide  20 mg Oral 3 times weekly  . insulin aspart  0-15 Units Subcutaneous Q6H  . insulin glargine  10 Units Subcutaneous QHS  . midazolam      . mulitivitamin with minerals  1 tablet Oral Daily  . mulitivitamin with minerals  1 tablet Oral Once  . omega-3 acid ethyl esters  1 g Oral Daily  . omega-3 acid ethyl esters  1 g Oral Once  . oxyCODONE  15 mg Oral Q6H  . oxyCODONE  15 mg Oral Once  . pantoprazole  40 mg Oral Q1200  . potassium chloride SA  20 mEq Oral TID  . potassium chloride  20 mEq Oral Once  . sertraline  50 mg Oral Daily  . sertraline  50 mg Oral Once  . simvastatin  20 mg Oral q1800  . sodium chloride      .  vancomycin  1,000 mg Intravenous Q24H  . warfarin  0.5 mg Oral ONCE-1800  . Warfarin - Pharmacist Dosing Inpatient   Does not apply q1800  . DISCONTD: insulin aspart  0-9 Units Subcutaneous TID WC  . DISCONTD: insulin aspart  5 Units Subcutaneous TID WC  . DISCONTD: insulin glargine  15 Units Subcutaneous QHS  . DISCONTD: vancomycin  1,250 mg Intravenous Q24H   Continuous:   . dextrose 60 mL/hr at 04/16/11 1054  . 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 She is hypoglycemic. She has diabetes. She has peripheral vascular disease she has had problems with delirium Principal Problem:  *Osteomyelitis  of foot, left, acute Active Problems:  Diabetes mellitus  Peripheral vascular disease in diabetes mellitus  Hyperlipidemia  Thromboembolism of lower extremity artery  Cellulitis of left foot  Cerebral embolus with cerebral infarction  Delirium    Plan: No change in treatments for now. She will continue IV antibiotics for presumed osteomyelitis. She will continue on Coumadin and antiplatelet drugs.    LOS: 11 days   Missey Hasley L 04/16/2011, 11:12 AM

## 2011-04-16 NOTE — Progress Notes (Signed)
Received call from [facility] in the lab with critical value of glucose 38. Rechecked blood sugar via accucheck BG was 40. Night shift nurse gave 25 mL of D50 and BG was rechecked per protocol and it was 65. I gave another 25 mL D50 and will recheck per protocol. Px alert. Will continue to monitor. Armand Preast y. Janalyn Rouse, RN, MSN

## 2011-04-16 NOTE — Procedures (Signed)
NAMELYNNON, SCHORN                ACCOUNT NO.:  [id number]  MEDICAL RECORD NO.:  [id number]  LOCATION:  A329                          FACILITY:  [facility]  PHYSICIAN:  Sorayah Schrodt A. Merlene Laughter, M.D. DATE OF BIRTH:  July 14, 1970  DATE OF PROCEDURE: DATE OF DISCHARGE:                             EEG INTERPRETATION   This is a 41 year old lady, who presents with altered mental status, spells of confusion.  The study is being done to evaluate for possible seizures.  MEDICATION:  Norvasc, Xanax, Lasix, insulin, oxycodone, Protonix, potassium, Zoloft, Zocor, Coumadin.  ANALYSIS:  A 16-channel recording using standard 10/20 measurements is conducted for 24 minutes.  There is a well-formed posterior dominant rhythm of 7 Hz, which attenuates with eye opening.  There is beta activity observed in the frontal areas.  Awake and drowsy activities are observed.  Photic stimulation is carried out without abnormal changes in the background activity.  There is no focal or lateralized slowing. There is no epileptiform activity.  IMPRESSION:  Mild generalized slowing, but no epileptiform activities.     Tyauna Lacaze A. Merlene Laughter, M.D.     KAD/MEDQ  D:  04/16/2011  T:  04/16/2011  Job:  [id number]

## 2011-04-17 LAB — GLUCOSE, CAPILLARY
Glucose-Capillary: 110 mg/dL — ABNORMAL HIGH (ref 70–99)
Glucose-Capillary: 114 mg/dL — ABNORMAL HIGH (ref 70–99)
Glucose-Capillary: 183 mg/dL — ABNORMAL HIGH (ref 70–99)
Glucose-Capillary: 212 mg/dL — ABNORMAL HIGH (ref 70–99)
Glucose-Capillary: 280 mg/dL — ABNORMAL HIGH (ref 70–99)
Glucose-Capillary: 295 mg/dL — ABNORMAL HIGH (ref 70–99)

## 2011-04-17 LAB — PROTIME-INR
INR: 2.21 — ABNORMAL HIGH (ref 0.00–1.49)
Prothrombin Time: 24.9 seconds — ABNORMAL HIGH (ref 11.6–15.2)

## 2011-04-17 MED ORDER — WARFARIN SODIUM 2.5 MG PO TABS
2.5000 mg | ORAL_TABLET | Freq: Once | ORAL | Status: AC
  Administered 2011-04-17: 2.5 mg via ORAL
  Filled 2011-04-17: qty 1

## 2011-04-17 MED ORDER — SODIUM CHLORIDE 0.9 % IJ SOLN
INTRAMUSCULAR | Status: AC
  Filled 2011-04-17: qty 3

## 2011-04-17 NOTE — Progress Notes (Signed)
[name], [name]                ACCOUNT NO.:  [id number]  MEDICAL RECORD NO.:  [id number]  LOCATION:  A329                          FACILITY:  [facility]  PHYSICIAN:  Janyra Barillas L. Luan Pulling, M.D.DATE OF BIRTH:  [birth year]  DATE OF PROCEDURE: DATE OF DISCHARGE:                                PROGRESS NOTE   [name] continues to have problems with pain.  She is having problems with her foot now which looks worse.  I am concerned that she is going to have to have an amputation now.  PHYSICAL EXAMINATION:  VITAL SIGNS:  Her temperature is 99.3, pulse 107, respirations 20, blood pressure 133/76. EXTREMITIES:  Her foot looks much worse.  It is much cooler, more blue and I think she is going to have to have an amputation, but I want to discuss that with an orthopedist.  She had seen a physician in [place] for her initial amputation of her toes, but she has some interest in having it done here, so I will ask for orthopedic consultations, continue with other treatments.     Momo Braun L. Luan Pulling, M.D.     ELH/MEDQ  D:  04/17/2011  T:  04/17/2011  Job:  [id number]

## 2011-04-17 NOTE — Progress Notes (Signed)
[place] for Warfarin Indication: history of thromboembolic occlusion the left lower extremities   Allergies  Allergen Reactions  . Aspirin Other (See Comments)    Cannot take this medication due to kidney disease  . Ibuprofen Other (See Comments)    Kidney disease   Patient Measurements: Height: 5\' 1"  (154.9 cm) Weight: 149 lb 7.6 oz (67.8 kg) IBW/kg (Calculated) : 47.8   Vital Signs: Temp: 99.3 F (37.4 C) (03/30 0551) Temp src: Oral (03/30 0551) BP: 133/76 mmHg (03/30 0551) Pulse Rate: 107  (03/30 0551)  Labs:  Basename 04/17/11 0450 04/16/11 0547 04/15/11 0404  HGB -- -- --  HCT -- -- --  PLT -- -- --  APTT -- -- --  LABPROT 24.9* 35.4* 32.4*  INR 2.21* 3.47* 3.10*  HEPARINUNFRC -- -- --  CREATININE -- 1.27* --  CKTOTAL -- -- --  CKMB -- -- --  TROPONINI -- -- --   Estimated Creatinine Clearance: 51.4 ml/min (by C-G formula based on Cr of 1.27).  Medical History: Past Medical History  Diagnosis Date  . Shingles     11/2010  . Cellulitis     of face - 12/2010  . Peripheral vascular disease in diabetes mellitus   . Hyperlipidemia   . Tobacco abuse     0.5 - 1ppd x 21 yrs  . Headache   . Renal disorder     glomerulonephritis  . Blood transfusion   . Anxiety   . Depression   . Peripheral vascular disease   . Diabetes mellitus     IDDM Since Age 10  . Embolism - blood clot   . Renal insufficiency    Medications:  Scheduled:     . amLODipine  2.5 mg Oral Daily  . ciprofloxacin  500 mg Oral BID  . clopidogrel  75 mg Oral Q breakfast  . dextrose      . furosemide  20 mg Oral 3 times weekly  . insulin aspart  0-15 Units Subcutaneous Q6H  . insulin glargine  10 Units Subcutaneous QHS  . mulitivitamin with minerals  1 tablet Oral Daily  . omega-3 acid ethyl esters  1 g Oral Daily  . oxyCODONE  15 mg Oral Q6H  . pantoprazole  40 mg Oral Q1200  . potassium chloride SA  20 mEq Oral TID  . sertraline  50 mg  Oral Daily  . simvastatin  20 mg Oral q1800  . sodium chloride      . sodium chloride      . vancomycin  1,000 mg Intravenous Q24H  . warfarin  2.5 mg Oral ONCE-1800  . Warfarin - Pharmacist Dosing Inpatient   Does not apply q1800  . DISCONTD: insulin aspart  0-9 Units Subcutaneous TID WC  . DISCONTD: insulin aspart  5 Units Subcutaneous TID WC  . DISCONTD: insulin glargine  15 Units Subcutaneous QHS  . DISCONTD: vancomycin  1,250 mg Intravenous Q24H   Assessment: INR therapeutic today  Goal of Therapy:  INR 2-3   Plan: Coumadin 2.5mg  today x 1 Continue Daily PT/INR.  [place] Crane, Summer Mccolgan A 04/17/2011,8:38 AM

## 2011-04-18 LAB — GLUCOSE, CAPILLARY
Glucose-Capillary: 129 mg/dL — ABNORMAL HIGH (ref 70–99)
Glucose-Capillary: 138 mg/dL — ABNORMAL HIGH (ref 70–99)
Glucose-Capillary: 151 mg/dL — ABNORMAL HIGH (ref 70–99)
Glucose-Capillary: 173 mg/dL — ABNORMAL HIGH (ref 70–99)
Glucose-Capillary: 223 mg/dL — ABNORMAL HIGH (ref 70–99)
Glucose-Capillary: 335 mg/dL — ABNORMAL HIGH (ref 70–99)

## 2011-04-18 LAB — PROTIME-INR
INR: 1.72 — ABNORMAL HIGH (ref 0.00–1.49)
Prothrombin Time: 20.5 seconds — ABNORMAL HIGH (ref 11.6–15.2)

## 2011-04-18 MED ORDER — WARFARIN SODIUM 2 MG PO TABS: 4.0000 mg | ORAL_TABLET | Freq: Once | ORAL | Status: DC

## 2011-04-18 MED ORDER — ENOXAPARIN SODIUM 100 MG/ML ~~LOC~~ SOLN
100.0000 mg | SUBCUTANEOUS | Status: DC
  Administered 2011-04-18 – 2011-04-20 (×3): 100 mg via SUBCUTANEOUS
  Filled 2011-04-18 (×6): qty 1

## 2011-04-18 MED ORDER — SODIUM CHLORIDE 0.9 % IJ SOLN
INTRAMUSCULAR | Status: AC
  Filled 2011-04-18: qty 9

## 2011-04-18 MED ORDER — ENOXAPARIN SODIUM 120 MG/0.8ML ~~LOC~~ SOLN: 1.5000 mg/kg | SUBCUTANEOUS | Status: DC

## 2011-04-18 NOTE — Progress Notes (Signed)
[name], [name]                ACCOUNT NO.:  [id number]  MEDICAL RECORD NO.:  [id number]  LOCATION:  [id number]                          FACILITY:  [facility]  PHYSICIAN:  [name], MD DATE OF BIRTH:  [birth year]  DATE OF PROCEDURE:  04/18/2011 DATE OF DISCHARGE:                                PROGRESS NOTE   CHIEF COMPLAINT:  Follow up for uncontrolled type 1 diabetes.  SUBJECTIVE:  The patient had much better 48 hours stretch since she was on dextrose 5%.  She still does not eat adequately by mouth.  She complains of pain from her left lower extremity.  OBJECTIVE:  GENERAL: She is watched by a sitter and not in acute distress.  VITAL SIGNS: Blood pressure 96/62, pulse rate 83, temperature 98.2. HEENT: Well hydrated.  CHEST: Clear to auscultation bilaterally. CARDIOVASCULAR: Normal S1 and S2.  No murmur.  No gallop.  ABDOMEN: Soft and nontender.  EXTREMITIES:  Left lower extremity foot is blue with no pulse, clearly ischemic.  LABORATORY DATA:  Her blood glucose profile ranged from 110-330.  No hypoglycemia.  ASSESSMENT: 1. Type 1 diabetes, chronically uncontrolled. 2. Difficult-to-control glycemic profile. 3. Gangrene and osteomyelitis of left lower extremity. 4. Severe peripheral vascular disease.  PLAN:  Continue dextrose 5% at 75 mL per hour.  Encourage oral feeding. Perform Accu-Cheks every 6 hours, and use NovoLog moderate dose to cover unexpected hyperglycemia.  She will still need Lantus 10 units at bedtime to prevent TKA. If glycemic profile is above 250 x 3, she will need glucose stabilizer in ICU.          ______________________________ [name], MD     GN/MEDQ  D:  04/18/2011  T:  04/18/2011  Job:  [id number]

## 2011-04-18 NOTE — Progress Notes (Signed)
[place] for Lovenox (has been on Warfarin) Indication: history of thromboembolic occlusion the left lower extremities   Allergies  Allergen Reactions  . Aspirin Other (See Comments)    Cannot take this medication due to kidney disease  . Ibuprofen Other (See Comments)    Kidney disease   Patient Measurements: Height: 5\' 1"  (154.9 cm) Weight: 149 lb 7.6 oz (67.8 kg) IBW/kg (Calculated) : 47.8   Vital Signs: Temp: 98.2 F (36.8 C) (03/31 0552) Temp src: Oral (03/31 0552) BP: 96/62 mmHg (03/31 0552) Pulse Rate: 83  (03/31 0552)  Labs:  Basename 04/18/11 0454 04/17/11 0450 04/16/11 0547  HGB -- -- --  HCT -- -- --  PLT -- -- --  APTT -- -- --  LABPROT 20.5* 24.9* 35.4*  INR 1.72* 2.21* 3.47*  HEPARINUNFRC -- -- --  CREATININE -- -- 1.27*  CKTOTAL -- -- --  CKMB -- -- --  TROPONINI -- -- --   Estimated Creatinine Clearance: 51.4 ml/min (by C-G formula based on Cr of 1.27).  Medical History: Past Medical History  Diagnosis Date  . Shingles     11/2010  . Cellulitis     of face - 12/2010  . Peripheral vascular disease in diabetes mellitus   . Hyperlipidemia   . Tobacco abuse     0.5 - 1ppd x 21 yrs  . Headache   . Renal disorder     glomerulonephritis  . Blood transfusion   . Anxiety   . Depression   . Peripheral vascular disease   . Diabetes mellitus     IDDM Since Age 43  . Embolism - blood clot   . Renal insufficiency    Medications:  Scheduled:     . amLODipine  2.5 mg Oral Daily  . ciprofloxacin  500 mg Oral BID  . clopidogrel  75 mg Oral Q breakfast  . enoxaparin (LOVENOX) injection  100 mg Subcutaneous Q24H  . furosemide  20 mg Oral 3 times weekly  . insulin aspart  0-15 Units Subcutaneous Q6H  . insulin glargine  10 Units Subcutaneous QHS  . mulitivitamin with minerals  1 tablet Oral Daily  . omega-3 acid ethyl esters  1 g Oral Daily  . oxyCODONE  15 mg Oral Q6H  . pantoprazole  40 mg Oral Q1200  .  potassium chloride SA  20 mEq Oral TID  . sertraline  50 mg Oral Daily  . simvastatin  20 mg Oral q1800  . sodium chloride      . sodium chloride      . vancomycin  1,000 mg Intravenous Q24H  . warfarin  2.5 mg Oral ONCE-1800  . warfarin  4 mg Oral ONCE-1800  . Warfarin - Pharmacist Dosing Inpatient   Does not apply q1800  . DISCONTD: enoxaparin (LOVENOX) injection  1.5 mg/kg Subcutaneous Q24H   Assessment: INR sub therapeutic and unstable, > 3 several days ago now below goal On multiple meds that interact with Coumadin Home dose was reportedly 3mg  daily Add Lovenox protocol per MD  Goal of Therapy:  INR 2-3 with coumadin Full anticoagulation with Lovenox until INR therapeutic (f/u plans for surgery on lower extremity???)   Plan: Lovenox 1.5mg  per KG (100mg ) sq q24hrs F/U plans re: surgery, Warfarin INR daily CBC per protocol  [name] 04/18/2011,10:06 AM

## 2011-04-18 NOTE — Progress Notes (Signed)
Subjective: Her foot is clearly worse. She understands that she's going to have to have an amputation. I have asked for orthopedic consultation and have discussed her status with Dr. Luna Glasgow and he requests that Dr. Romona Curls general surgeon also be involved. I have written a consult. She continues to have significant pain in her foot. She's had some problems with confusion. I attempted again today to try to get in touch with her family specifically her father at the number listed on the chart and have been unsuccessful in contacting him  Objective: Vital signs in last 24 hours: Temp:  [98.2 F (36.8 C)-100 F (37.8 C)] 98.2 F (36.8 C) (03/31 0552) Pulse Rate:  [83-99] 83  (03/31 0552) Resp:  [20-22] 22  (03/30 2135) BP: (96-112)/(62-72) 106/67 mmHg (03/31 1013) SpO2:  [91 %-94 %] 94 % (03/31 0552) Weight change:  Last BM Date: 04/15/11  Intake/Output from previous day: 03/30 0701 - 03/31 0700 In: 1926.5 [P.O.:480; I.V.:1446.5] Out: 1800 [Urine:1800]  PHYSICAL EXAM General appearance: alert, moderate distress and Occasionally confused Resp: clear to auscultation bilaterally Cardio: regular rate and rhythm, S1, S2 normal, no murmur, click, rub or gallop GI: soft, non-tender; bowel sounds normal; no masses,  no organomegaly Extremities: Her foot is definitely ischemic in appearance now. She has had further gangrene.  Lab Results:    Basic Metabolic Panel:  Basename 04/16/11 0547  NA 140  K 4.1  CL 111  CO2 22  GLUCOSE 38*  BUN 8  CREATININE 1.27*  CALCIUM 8.3*  MG --  PHOS --   Liver Function Tests: No results found for this basename: AST:2,ALT:2,ALKPHOS:2,BILITOT:2,PROT:2,ALBUMIN:2 in the last 72 hours No results found for this basename: LIPASE:2,AMYLASE:2 in the last 72 hours  Basename 04/15/11 1102  AMMONIA 23   CBC: No results found for this basename: WBC:2,NEUTROABS:2,HGB:2,HCT:2,MCV:2,PLT:2 in the last 72 hours Cardiac Enzymes: No results found for this  basename: CKTOTAL:3,CKMB:3,CKMBINDEX:3,TROPONINI:3 in the last 72 hours BNP: No results found for this basename: PROBNP:3 in the last 72 hours D-Dimer: No results found for this basename: DDIMER:2 in the last 72 hours CBG:  Basename 04/18/11 0740 04/18/11 0629 04/18/11 0032 04/17/11 2133 04/17/11 1605 04/17/11 1126  GLUCAP 138* 151* 335* 295* 114* 280*   Hemoglobin A1C: No results found for this basename: HGBA1C in the last 72 hours Fasting Lipid Panel: No results found for this basename: CHOL,HDL,LDLCALC,TRIG,CHOLHDL,LDLDIRECT in the last 72 hours Thyroid Function Tests: No results found for this basename: TSH,T4TOTAL,FREET4,T3FREE,THYROIDAB in the last 72 hours Anemia Panel: No results found for this basename: VITAMINB12,FOLATE,FERRITIN,TIBC,IRON,RETICCTPCT in the last 72 hours Coagulation:  Basename 04/18/11 0454 04/17/11 0450  LABPROT 20.5* 24.9*  INR 1.72* 2.21*   Urine Drug Screen: Drugs of Abuse     Component Value Date/Time   LABOPIA NONE DETECTED 03/21/2010 2130   COCAINSCRNUR POSITIVE* 03/21/2010 2130   LABBENZ NONE DETECTED 03/21/2010 2130   AMPHETMU NONE DETECTED 03/21/2010 2130   THCU NONE DETECTED 03/21/2010 2130   LABBARB  Value: NONE DETECTED        DRUG SCREEN FOR MEDICAL PURPOSES ONLY.  IF CONFIRMATION IS NEEDED FOR ANY PURPOSE, NOTIFY LAB WITHIN 5 DAYS.        LOWEST DETECTABLE LIMITS FOR URINE DRUG SCREEN Drug Class       Cutoff (ng/mL) Amphetamine      1000 Barbiturate      200 Benzodiazepine   [id number] Tricyclics       [id number] Opiates          300  Cocaine          300 THC              50 03/21/2010 2130    Alcohol Level: No results found for this basename: ETH:2 in the last 72 hours Urinalysis: No results found for this basename: COLORURINE:2,APPERANCEUR:2,LABSPEC:2,PHURINE:2,GLUCOSEU:2,HGBUR:2,BILIRUBINUR:2,KETONESUR:2,PROTEINUR:2,UROBILINOGEN:2,NITRITE:2,LEUKOCYTESUR:2 in the last 72 hours Misc. Labs:  ABGS No results found for this basename: PHART,PCO2,PO2ART,TCO2,HCO3  in the last 72 hours CULTURES Recent Results (from the past 240 hour(s))  CULTURE, BLOOD (ROUTINE X 2)     Status: Normal (Preliminary result)   Collection Time   04/13/11  6:34 PM      Component Value Range Status Comment   Specimen Description BLOOD LEFT WRIST   Final    Special Requests     Final    Value: BOTTLES DRAWN AEROBIC AND ANAEROBIC AEB=9CC ANA=4CC   Culture NO GROWTH 2 DAYS   Final    Report Status PENDING   Incomplete    Studies/Results: No results found.  Medications:  Prior to Admission:  Prescriptions prior to admission  Medication Sig Dispense Refill  . acetaminophen (TYLENOL) 500 MG tablet Take 1,000 mg by mouth every 6 (six) hours as needed. For pain       . ALPRAZolam (XANAX) 1 MG tablet Take 1 mg by mouth daily as needed. For anxiety      . amLODipine (NORVASC) 2.5 MG tablet Take 1 tablet (2.5 mg total) by mouth daily.  30 tablet  5  . cephALEXin (KEFLEX) 500 MG capsule Take 500 mg by mouth 3 (three) times daily.      . diphenhydrAMINE (BENADRYL) 25 MG tablet Take 25 mg by mouth at bedtime as needed. For sleep      . fish oil-omega-3 fatty acids 1000 MG capsule Take 1 g by mouth daily.      . furosemide (LASIX) 20 MG tablet Take 20 mg by mouth 3 (three) times a week. On Mondays, Wednesdays, and Fridays      . insulin aspart (NOVOLOG) 100 UNIT/ML injection Inject 5 Units into the skin 3 (three) times daily before meals.  1 vial  12  . insulin glargine (LANTUS) 100 UNIT/ML injection Inject 15 Units into the skin at bedtime.  10 mL  12  . Multiple Vitamins-Minerals (MULTIVITAMINS THER. W/MINERALS) TABS Take 1 tablet by mouth daily.        . ondansetron (ZOFRAN-ODT) 8 MG disintegrating tablet Take 8 mg by mouth every 12 (twelve) hours as needed. Nausea      . oxyCODONE-acetaminophen (PERCOCET) 10-325 MG per tablet Take 1 tablet by mouth every 6 (six) hours as needed for pain.  30 tablet  0  . potassium chloride SA (K-DUR,KLOR-CON) 20 MEQ tablet Take 0.5 tablets (10  mEq total) by mouth daily.  7 tablet  0  . pravastatin (PRAVACHOL) 40 MG tablet Take 1 tablet (40 mg total) by mouth daily.  30 tablet  5  . sertraline (ZOLOFT) 50 MG tablet Take 50 mg by mouth daily.       Marland Kitchen warfarin (COUMADIN) 3 MG tablet Take 3 mg by mouth daily.       Scheduled:   . amLODipine  2.5 mg Oral Daily  . ciprofloxacin  500 mg Oral BID  . clopidogrel  75 mg Oral Q breakfast  . enoxaparin (LOVENOX) injection  100 mg Subcutaneous Q24H  . furosemide  20 mg Oral 3 times weekly  . insulin aspart  0-15 Units Subcutaneous Q6H  .  insulin glargine  10 Units Subcutaneous QHS  . mulitivitamin with minerals  1 tablet Oral Daily  . omega-3 acid ethyl esters  1 g Oral Daily  . oxyCODONE  15 mg Oral Q6H  . pantoprazole  40 mg Oral Q1200  . potassium chloride SA  20 mEq Oral TID  . sertraline  50 mg Oral Daily  . simvastatin  20 mg Oral q1800  . sodium chloride      . sodium chloride      . vancomycin  1,000 mg Intravenous Q24H  . warfarin  2.5 mg Oral ONCE-1800  . warfarin  4 mg Oral ONCE-1800  . Warfarin - Pharmacist Dosing Inpatient   Does not apply q1800  . DISCONTD: enoxaparin (LOVENOX) injection  1.5 mg/kg Subcutaneous Q24H   Continuous:   . dextrose 75 mL/hr at 04/18/11 [id number]   [id number], ALPRAZolam, dextrose, dextrose, dextrose, diphenhydrAMINE, morphine injection, ondansetron  Assesment: She has an ischemic foot with gangrene. She is clearly going to need an amputation. She has what is probably some osteomyelitis in the foot as well. She continues having problems with some confusion. She had a stroke which it appears to be in block in nature. She had emboli to her foot as well. We do not have a source of emboli at this point. Principal Problem:  *Osteomyelitis of foot, left, acute Active Problems:  Diabetes mellitus  Peripheral vascular disease in diabetes mellitus  Hyperlipidemia  Thromboembolism of lower extremity artery  Cellulitis of left foot  Cerebral  embolus with cerebral infarction  Delirium    Plan: I've discontinued Coumadin asked for Lovenox by pharmacy asked for Dr. Luna Glasgow and Dr. Romona Curls to see her.    LOS: 13 days   Tawnie Ehresman L 04/18/2011, 10:29 AM

## 2011-04-18 NOTE — Progress Notes (Signed)
ANTIBIOTIC CONSULT NOTE   Pharmacy Consult for Vancomycin and Zosyn Indication: cellulitis of face vs osteomyelitis of foot  Allergies  Allergen Reactions  . Aspirin Other (See Comments)    Cannot take this medication due to kidney disease  . Ibuprofen Other (See Comments)    Kidney disease   Patient Measurements: Height: 5\' 1"  (154.9 cm) Weight: 149 lb 7.6 oz (67.8 kg) IBW/kg (Calculated) : 47.8   Vital Signs: Temp: 98.2 F (36.8 C) (03/31 0552) Temp src: Oral (03/31 0552) BP: 96/62 mmHg (03/31 0552) Pulse Rate: 83  (03/31 0552) Intake/Output from previous day: 03/30 0701 - 03/31 0700 In: 1926.5 [P.O.:480; I.V.:1446.5] Out: 1800 [Urine:1800] Intake/Output from this shift:    Labs:  [facility] 04/16/11 0547  WBC --  HGB --  PLT --  LABCREA --  CREATININE 1.27*   Estimated Creatinine Clearance: 51.4 ml/min (by C-G formula based on Cr of 1.27).  Basename 04/16/11 0732  VANCOTROUGH 24.0*  VANCOPEAK --  Jake Michaelis --  GENTTROUGH --  GENTPEAK --  GENTRANDOM --  TOBRATROUGH --  TOBRAPEAK --  TOBRARND --  AMIKACINPEAK --  AMIKACINTROU --  AMIKACIN --    Microbiology: Recent Results (from the past 720 hour(s))  MRSA PCR SCREENING     Status: Normal   Collection Time   03/28/11 12:00 AM      Component Value Range Status Comment   MRSA by PCR NEGATIVE  NEGATIVE  Final   URINE CULTURE     Status: Normal   Collection Time   03/29/11  7:30 PM      Component Value Range Status Comment   Specimen Description URINE, CLEAN CATCH   Final    Special Requests NONE   Final    Culture  Setup Time [id number]   Final    Colony Count NO GROWTH   Final    Culture NO GROWTH   Final    Report Status 03/30/2011 FINAL   Final   CULTURE, BLOOD (ROUTINE X 2)     Status: Normal   Collection Time   03/31/11 10:55 AM      Component Value Range Status Comment   Specimen Description BLOOD LEFT HAND   Final    Special Requests BOTTLES DRAWN AEROBIC ONLY 4CC   Final    Culture NO  GROWTH 5 DAYS   Final    Report Status 04/05/2011 FINAL   Final   MRSA PCR SCREENING     Status: Normal   Collection Time   04/05/11 10:42 PM      Component Value Range Status Comment   MRSA by PCR NEGATIVE  NEGATIVE  Final   CULTURE, BLOOD (ROUTINE X 2)     Status: Normal (Preliminary result)   Collection Time   04/13/11  6:34 PM      Component Value Range Status Comment   Specimen Description BLOOD LEFT WRIST   Final    Special Requests     Final    Value: BOTTLES DRAWN AEROBIC AND ANAEROBIC AEB=9CC ANA=4CC   Culture NO GROWTH 2 DAYS   Final    Report Status PENDING   Incomplete    Medical History: Past Medical History  Diagnosis Date  . Shingles     11/2010  . Cellulitis     of face - 12/2010  . Peripheral vascular disease in diabetes mellitus   . Hyperlipidemia   . Tobacco abuse     0.5 - 1ppd x 21 yrs  . Headache   .  Renal disorder     glomerulonephritis  . Blood transfusion   . Anxiety   . Depression   . Peripheral vascular disease   . Diabetes mellitus     IDDM Since Age 68  . Embolism - blood clot   . Renal insufficiency    Medications:  Scheduled:     . amLODipine  2.5 mg Oral Daily  . ciprofloxacin  500 mg Oral BID  . clopidogrel  75 mg Oral Q breakfast  . furosemide  20 mg Oral 3 times weekly  . insulin aspart  0-15 Units Subcutaneous Q6H  . insulin glargine  10 Units Subcutaneous QHS  . mulitivitamin with minerals  1 tablet Oral Daily  . omega-3 acid ethyl esters  1 g Oral Daily  . oxyCODONE  15 mg Oral Q6H  . pantoprazole  40 mg Oral Q1200  . potassium chloride SA  20 mEq Oral TID  . sertraline  50 mg Oral Daily  . simvastatin  20 mg Oral q1800  . sodium chloride      . sodium chloride      . vancomycin  1,000 mg Intravenous Q24H  . warfarin  2.5 mg Oral ONCE-1800  . warfarin  4 mg Oral ONCE-1800  . Warfarin - Pharmacist Dosing Inpatient   Does not apply q1800   Assessment: Having difficulty with pain control Vancomycin dose modified  3/29  Goal of Therapy:  Vancomycin trough level 15-20 mcg/ml  Plan: F/U trough level and SCr tomorrow Continue Zosyn 3.375 GM IV every 8 hours Consider adding Fentanyl patch for pain control Monitor labs per protocol  [name] 04/18/2011,8:38 AM

## 2011-04-18 NOTE — Progress Notes (Signed)
Patient ID: [name], female   DOB: Apr 27, 1970, 41 y.o.   MRN: [id number] [phone]

## 2011-04-18 NOTE — Progress Notes (Signed)
[place] for Warfarin Indication: history of thromboembolic occlusion the left lower extremities   Allergies  Allergen Reactions  . Aspirin Other (See Comments)    Cannot take this medication due to kidney disease  . Ibuprofen Other (See Comments)    Kidney disease   Patient Measurements: Height: 5\' 1"  (154.9 cm) Weight: 149 lb 7.6 oz (67.8 kg) IBW/kg (Calculated) : 47.8   Vital Signs: Temp: 98.2 F (36.8 C) (03/31 0552) Temp src: Oral (03/31 0552) BP: 96/62 mmHg (03/31 0552) Pulse Rate: 83  (03/31 0552)  Labs:  Basename 04/18/11 0454 04/17/11 0450 04/16/11 0547  HGB -- -- --  HCT -- -- --  PLT -- -- --  APTT -- -- --  LABPROT 20.5* 24.9* 35.4*  INR 1.72* 2.21* 3.47*  HEPARINUNFRC -- -- --  CREATININE -- -- 1.27*  CKTOTAL -- -- --  CKMB -- -- --  TROPONINI -- -- --   Estimated Creatinine Clearance: 51.4 ml/min (by C-G formula based on Cr of 1.27).  Medical History: Past Medical History  Diagnosis Date  . Shingles     11/2010  . Cellulitis     of face - 12/2010  . Peripheral vascular disease in diabetes mellitus   . Hyperlipidemia   . Tobacco abuse     0.5 - 1ppd x 21 yrs  . Headache   . Renal disorder     glomerulonephritis  . Blood transfusion   . Anxiety   . Depression   . Peripheral vascular disease   . Diabetes mellitus     IDDM Since Age 41  . Embolism - blood clot   . Renal insufficiency    Medications:  Scheduled:     . amLODipine  2.5 mg Oral Daily  . ciprofloxacin  500 mg Oral BID  . clopidogrel  75 mg Oral Q breakfast  . furosemide  20 mg Oral 3 times weekly  . insulin aspart  0-15 Units Subcutaneous Q6H  . insulin glargine  10 Units Subcutaneous QHS  . mulitivitamin with minerals  1 tablet Oral Daily  . omega-3 acid ethyl esters  1 g Oral Daily  . oxyCODONE  15 mg Oral Q6H  . pantoprazole  40 mg Oral Q1200  . potassium chloride SA  20 mEq Oral TID  . sertraline  50 mg Oral Daily  .  simvastatin  20 mg Oral q1800  . sodium chloride      . sodium chloride      . vancomycin  1,000 mg Intravenous Q24H  . warfarin  2.5 mg Oral ONCE-1800  . warfarin  4 mg Oral ONCE-1800  . Warfarin - Pharmacist Dosing Inpatient   Does not apply q1800   Assessment: INR unstable, > 3 several days ago now below goal On multiple meds that interact with Coumadin Home dose was reportedly 3mg  daily  Goal of Therapy:  INR 2-3   Plan: Coumadin 4mg  today x 1 Continue Daily PT/INR.  [name] 04/18/2011,8:29 AM

## 2011-04-19 LAB — CBC
HCT: 19.4 % — ABNORMAL LOW (ref 36.0–46.0)
Hemoglobin: 6.4 g/dL — CL (ref 12.0–15.0)
MCH: 30.3 pg (ref 26.0–34.0)
MCHC: 33 g/dL (ref 30.0–36.0)
MCV: 91.9 fL (ref 78.0–100.0)
Platelets: 921 10*3/uL (ref 150–400)
RBC: 2.11 MIL/uL — ABNORMAL LOW (ref 3.87–5.11)
RDW: 14.5 % (ref 11.5–15.5)
WBC: 13.2 10*3/uL — ABNORMAL HIGH (ref 4.0–10.5)

## 2011-04-19 LAB — BASIC METABOLIC PANEL
BUN: 7 mg/dL (ref 6–23)
CO2: 25 mEq/L (ref 19–32)
Calcium: 7.8 mg/dL — ABNORMAL LOW (ref 8.4–10.5)
Chloride: 105 mEq/L (ref 96–112)
Creatinine, Ser: 1.45 mg/dL — ABNORMAL HIGH (ref 0.50–1.10)
GFR calc Af Amer: 51 mL/min — ABNORMAL LOW (ref >90)
GFR calc non Af Amer: 44 mL/min — ABNORMAL LOW (ref >90)
Glucose, Bld: 98 mg/dL (ref 70–99)
Potassium: 3.9 mEq/L (ref 3.5–5.1)
Sodium: 136 mEq/L (ref 135–145)

## 2011-04-19 LAB — GLUCOSE, CAPILLARY
Glucose-Capillary: 116 mg/dL — ABNORMAL HIGH (ref 70–99)
Glucose-Capillary: 120 mg/dL — ABNORMAL HIGH (ref 70–99)
Glucose-Capillary: 142 mg/dL — ABNORMAL HIGH (ref 70–99)
Glucose-Capillary: 36 mg/dL — CL (ref 70–99)
Glucose-Capillary: 61 mg/dL — ABNORMAL LOW (ref 70–99)
Glucose-Capillary: 67 mg/dL — ABNORMAL LOW (ref 70–99)
Glucose-Capillary: 73 mg/dL (ref 70–99)
Glucose-Capillary: 83 mg/dL (ref 70–99)

## 2011-04-19 LAB — DIFFERENTIAL
Basophils Absolute: 0 10*3/uL (ref 0.0–0.1)
Basophils Relative: 0 % (ref 0–1)
Eosinophils Absolute: 0.4 10*3/uL (ref 0.0–0.7)
Eosinophils Relative: 3 % (ref 0–5)
Lymphocytes Relative: 20 % (ref 12–46)
Lymphs Abs: 2.6 10*3/uL (ref 0.7–4.0)
Monocytes Absolute: 1.5 10*3/uL — ABNORMAL HIGH (ref 0.1–1.0)
Monocytes Relative: 11 % (ref 3–12)
Neutro Abs: 8.7 10*3/uL — ABNORMAL HIGH (ref 1.7–7.7)
Neutrophils Relative %: 66 % (ref 43–77)
Smear Review: INCREASED

## 2011-04-19 LAB — PREPARE RBC (CROSSMATCH)

## 2011-04-19 LAB — ABO/RH: ABO/RH(D): B POS

## 2011-04-19 LAB — PROTIME-INR
INR: 1.8 — ABNORMAL HIGH (ref 0.00–1.49)
Prothrombin Time: 21.2 seconds — ABNORMAL HIGH (ref 11.6–15.2)

## 2011-04-19 LAB — VANCOMYCIN, TROUGH: Vancomycin Tr: 22 ug/mL — ABNORMAL HIGH (ref 10.0–20.0)

## 2011-04-19 MED ORDER — DIPHENHYDRAMINE HCL 12.5 MG/5ML PO ELIX
12.5000 mg | ORAL_SOLUTION | Freq: Four times a day (QID) | ORAL | Status: DC | PRN
  Filled 2011-04-19: qty 5

## 2011-04-19 MED ORDER — ONDANSETRON HCL 4 MG/2ML IJ SOLN: 4.0000 mg | Freq: Four times a day (QID) | INTRAMUSCULAR | Status: DC | PRN

## 2011-04-19 MED ORDER — MORPHINE SULFATE (PF) 1 MG/ML IV SOLN: INTRAVENOUS | Status: DC

## 2011-04-19 MED ORDER — SODIUM CHLORIDE 0.9 % IJ SOLN
10.0000 mL | INTRAMUSCULAR | Status: DC | PRN
  Administered 2011-04-19 – 2011-04-28 (×12): 10 mL

## 2011-04-19 MED ORDER — MORPHINE SULFATE (PF) 1 MG/ML IV SOLN
INTRAVENOUS | Status: AC
  Filled 2011-04-19: qty 25

## 2011-04-19 MED ORDER — NALOXONE HCL 0.4 MG/ML IJ SOLN: 0.4000 mg | INTRAMUSCULAR | Status: DC | PRN

## 2011-04-19 MED ORDER — VANCOMYCIN HCL 1000 MG IV SOLR
750.0000 mg | INTRAVENOUS | Status: DC
  Administered 2011-04-19 – 2011-04-23 (×5): 750 mg via INTRAVENOUS
  Filled 2011-04-19 (×6): qty 750

## 2011-04-19 MED ORDER — SODIUM CHLORIDE 0.9 % IJ SOLN
INTRAMUSCULAR | Status: AC
  Administered 2011-04-19: 11:00:00
  Filled 2011-04-19: qty 3

## 2011-04-19 MED ORDER — SODIUM CHLORIDE 0.9 % IJ SOLN: 9.0000 mL | INTRAMUSCULAR | Status: DC | PRN

## 2011-04-19 MED ORDER — DIPHENHYDRAMINE HCL 50 MG/ML IJ SOLN: 12.5000 mg | Freq: Four times a day (QID) | INTRAMUSCULAR | Status: DC | PRN

## 2011-04-19 MED ORDER — MORPHINE SULFATE (PF) 1 MG/ML IV SOLN
INTRAVENOUS | Status: DC
  Administered 2011-04-19: 21:00:00 via INTRAVENOUS
  Administered 2011-04-20: 5 mg via INTRAVENOUS
  Administered 2011-04-20: 6 mg via INTRAVENOUS
  Administered 2011-04-20: 23:00:00 via INTRAVENOUS
  Administered 2011-04-20: 11 mg via INTRAVENOUS
  Administered 2011-04-20: 11:00:00 via INTRAVENOUS
  Administered 2011-04-21: 4 mg via INTRAVENOUS
  Administered 2011-04-21: 18:00:00 via INTRAVENOUS
  Administered 2011-04-21: 2 mg via INTRAVENOUS
  Administered 2011-04-21: 6 mg via INTRAVENOUS
  Administered 2011-04-22: 9 mg via INTRAVENOUS
  Administered 2011-04-22: 11:00:00 via INTRAVENOUS
  Administered 2011-04-22: 8 mg via INTRAVENOUS
  Administered 2011-04-22 – 2011-04-23 (×2): 3 mg via INTRAVENOUS
  Administered 2011-04-23: 2 mg via INTRAVENOUS
  Administered 2011-04-23: 4 mg via INTRAVENOUS
  Administered 2011-04-23: 7 mg via INTRAVENOUS
  Administered 2011-04-23: 18:00:00 via INTRAVENOUS
  Administered 2011-04-23: 4 mg via INTRAVENOUS
  Administered 2011-04-24: 12:00:00 via INTRAVENOUS
  Administered 2011-04-24: 7 mg via INTRAVENOUS
  Administered 2011-04-24: 8 mg via INTRAVENOUS
  Administered 2011-04-24: 4 mg via INTRAVENOUS
  Administered 2011-04-25: 9.48 mg via INTRAVENOUS
  Administered 2011-04-25: 13 mg via INTRAVENOUS
  Administered 2011-04-25: 4.6 mg via INTRAVENOUS
  Administered 2011-04-25: 9.7 mg via INTRAVENOUS
  Administered 2011-04-25: 9 mg via INTRAVENOUS
  Administered 2011-04-26: 3 mg via INTRAVENOUS
  Administered 2011-04-26: 2 mg via INTRAVENOUS
  Administered 2011-04-26: 4.99 mg via INTRAVENOUS
  Administered 2011-04-26 (×2): 1 mg via INTRAVENOUS

## 2011-04-19 MED ORDER — SODIUM CHLORIDE 0.9 % IJ SOLN
INTRAMUSCULAR | Status: AC
  Administered 2011-04-19: 10 mL
  Filled 2011-04-19: qty 3

## 2011-04-19 MED ORDER — PIPERACILLIN-TAZOBACTAM 3.375 G IVPB
3.3750 g | Freq: Three times a day (TID) | INTRAVENOUS | Status: DC
  Administered 2011-04-19 – 2011-04-23 (×11): 3.375 g via INTRAVENOUS
  Filled 2011-04-19 (×15): qty 50

## 2011-04-19 MED ORDER — DIPHENHYDRAMINE HCL 12.5 MG/5ML PO ELIX: 12.5000 mg | ORAL_SOLUTION | Freq: Four times a day (QID) | ORAL | Status: DC | PRN

## 2011-04-19 MED ORDER — MORPHINE SULFATE 4 MG/ML IJ SOLN
6.0000 mg | INTRAMUSCULAR | Status: DC | PRN
  Administered 2011-04-19 (×3): 6 mg via INTRAVENOUS
  Filled 2011-04-19 (×3): qty 2

## 2011-04-19 NOTE — Progress Notes (Signed)
ANTIBIOTIC CONSULT NOTE   Pharmacy Consult for Vancomycin and Zosyn Indication: cellulitis of face vs osteomyelitis of foot  Allergies  Allergen Reactions  . Aspirin Other (See Comments)    Cannot take this medication due to kidney disease  . Ibuprofen Other (See Comments)    Kidney disease   Patient Measurements: Height: 5\' 1"  (154.9 cm) Weight: 149 lb 7.6 oz (67.8 kg) IBW/kg (Calculated) : 47.8   Vital Signs: Temp: 98.4 F (36.9 C) (04/01 0544) Temp src: Oral (04/01 0544) BP: 98/60 mmHg (04/01 0544) Pulse Rate: 94  (04/01 0544) Intake/Output from previous day: 03/31 0701 - 04/01 0700 In: [id number] [P.O.:420; I.V.:825] Out: 1125 [Urine:1125] Intake/Output from this shift: Total I/O In: 10 [I.V.:10] Out: -   Labs:  Basename 04/19/11 0401  WBC 13.2*  HGB 6.4*  PLT 921*  LABCREA --  CREATININE 1.45*   Estimated Creatinine Clearance: 45 ml/min (by C-G formula based on Cr of 1.45).  Basename 04/19/11 0753  VANCOTROUGH 22.0*  VANCOPEAK --  Jake Michaelis --  GENTTROUGH --  GENTPEAK --  GENTRANDOM --  TOBRATROUGH --  TOBRAPEAK --  TOBRARND --  AMIKACINPEAK --  AMIKACINTROU --  AMIKACIN --    Microbiology: Recent Results (from the past 720 hour(s))  MRSA PCR SCREENING     Status: Normal   Collection Time   03/28/11 12:00 AM      Component Value Range Status Comment   MRSA by PCR NEGATIVE  NEGATIVE  Final   URINE CULTURE     Status: Normal   Collection Time   03/29/11  7:30 PM      Component Value Range Status Comment   Specimen Description URINE, CLEAN CATCH   Final    Special Requests NONE   Final    Culture  Setup Time [id number]   Final    Colony Count NO GROWTH   Final    Culture NO GROWTH   Final    Report Status 03/30/2011 FINAL   Final   CULTURE, BLOOD (ROUTINE X 2)     Status: Normal   Collection Time   03/31/11 10:55 AM      Component Value Range Status Comment   Specimen Description BLOOD LEFT HAND   Final    Special Requests BOTTLES DRAWN  AEROBIC ONLY 4CC   Final    Culture NO GROWTH 5 DAYS   Final    Report Status 04/05/2011 FINAL   Final   MRSA PCR SCREENING     Status: Normal   Collection Time   04/05/11 10:42 PM      Component Value Range Status Comment   MRSA by PCR NEGATIVE  NEGATIVE  Final   CULTURE, BLOOD (ROUTINE X 2)     Status: Normal (Preliminary result)   Collection Time   04/13/11  6:34 PM      Component Value Range Status Comment   Specimen Description BLOOD LEFT WRIST   Final    Special Requests     Final    Value: BOTTLES DRAWN AEROBIC AND ANAEROBIC AEB=9CC ANA=4CC   Culture NO GROWTH 2 DAYS   Final    Report Status PENDING   Incomplete    Medical History: Past Medical History  Diagnosis Date  . Shingles     11/2010  . Cellulitis     of face - 12/2010  . Peripheral vascular disease in diabetes mellitus   . Hyperlipidemia   . Tobacco abuse     0.5 - 1ppd x 21  yrs  . Headache   . Renal disorder     glomerulonephritis  . Blood transfusion   . Anxiety   . Depression   . Peripheral vascular disease   . Diabetes mellitus     IDDM Since Age 89  . Embolism - blood clot   . Renal insufficiency    Medications:  Scheduled:     . amLODipine  2.5 mg Oral Daily  . ciprofloxacin  500 mg Oral BID  . clopidogrel  75 mg Oral Q breakfast  . enoxaparin (LOVENOX) injection  100 mg Subcutaneous Q24H  . furosemide  20 mg Oral 3 times weekly  . insulin aspart  0-15 Units Subcutaneous Q6H  . insulin glargine  10 Units Subcutaneous QHS  . mulitivitamin with minerals  1 tablet Oral Daily  . omega-3 acid ethyl esters  1 g Oral Daily  . oxyCODONE  15 mg Oral Q6H  . pantoprazole  40 mg Oral Q1200  . potassium chloride SA  20 mEq Oral TID  . sertraline  50 mg Oral Daily  . simvastatin  20 mg Oral q1800  . sodium chloride      . sodium chloride      . vancomycin  750 mg Intravenous Q24H  . DISCONTD: enoxaparin (LOVENOX) injection  1.5 mg/kg Subcutaneous Q24H  . DISCONTD: vancomycin  1,000 mg Intravenous  Q24H  . DISCONTD: warfarin  4 mg Oral ONCE-1800  . DISCONTD: Warfarin - Pharmacist Dosing Inpatient   Does not apply q1800   Assessment: Vancomycin trough level above goal despite recent dose reduction Poor vancomycin clearance  Goal of Therapy:  Vancomycin trough level 15-20 mcg/ml  Plan: Reduce Vancomycin to 750mg  iv q24hrs Re-check trough at steady state (Friday) Continue Zosyn 3.375 GM IV every 8 hours Monitor labs per protocol, SCR and CBC  [place] Crane, [place] Gilpatrick A 04/19/2011,9:12 AM

## 2011-04-19 NOTE — Progress Notes (Signed)
[place] for Lovenox (has been on Warfarin) Indication: history of thromboembolic occlusion the left lower extremities   Allergies  Allergen Reactions  . Aspirin Other (See Comments)    Cannot take this medication due to kidney disease  . Ibuprofen Other (See Comments)    Kidney disease   Patient Measurements: Height: 5\' 1"  (154.9 cm) Weight: 149 lb 7.6 oz (67.8 kg) IBW/kg (Calculated) : 47.8   Vital Signs: Temp: 98.4 F (36.9 C) (04/01 0544) Temp src: Oral (04/01 0544) BP: 98/60 mmHg (04/01 0544) Pulse Rate: 94  (04/01 0544)  Labs:  Basename 04/19/11 0401 04/18/11 0454 04/17/11 0450  HGB 6.4* -- --  HCT 19.4* -- --  PLT 921* -- --  APTT -- -- --  LABPROT 21.2* 20.5* 24.9*  INR 1.80* 1.72* 2.21*  HEPARINUNFRC -- -- --  CREATININE 1.45* -- --  CKTOTAL -- -- --  CKMB -- -- --  TROPONINI -- -- --   Estimated Creatinine Clearance: 45 ml/min (by C-G formula based on Cr of 1.45).  Medical History: Past Medical History  Diagnosis Date  . Shingles     11/2010  . Cellulitis     of face - 12/2010  . Peripheral vascular disease in diabetes mellitus   . Hyperlipidemia   . Tobacco abuse     0.5 - 1ppd x 21 yrs  . Headache   . Renal disorder     glomerulonephritis  . Blood transfusion   . Anxiety   . Depression   . Peripheral vascular disease   . Diabetes mellitus     IDDM Since Age 89  . Embolism - blood clot   . Renal insufficiency    Medications:  Scheduled:     . amLODipine  2.5 mg Oral Daily  . ciprofloxacin  500 mg Oral BID  . clopidogrel  75 mg Oral Q breakfast  . enoxaparin (LOVENOX) injection  100 mg Subcutaneous Q24H  . furosemide  20 mg Oral 3 times weekly  . insulin aspart  0-15 Units Subcutaneous Q6H  . insulin glargine  10 Units Subcutaneous QHS  . mulitivitamin with minerals  1 tablet Oral Daily  . omega-3 acid ethyl esters  1 g Oral Daily  . oxyCODONE  15 mg Oral Q6H  . pantoprazole  40 mg Oral Q1200   . potassium chloride SA  20 mEq Oral TID  . sertraline  50 mg Oral Daily  . simvastatin  20 mg Oral q1800  . sodium chloride      . vancomycin  1,000 mg Intravenous Q24H  . DISCONTD: enoxaparin (LOVENOX) injection  1.5 mg/kg Subcutaneous Q24H  . DISCONTD: warfarin  4 mg Oral ONCE-1800  . DISCONTD: Warfarin - Pharmacist Dosing Inpatient   Does not apply q1800   Assessment: INR < 2 H/H dropping, awaiting MD assessment and input On multiple meds that interact with Coumadin Home dose was reportedly 3mg  daily Add Lovenox protocol per MD, Warfarin stopped in anticipation of surgery, amputation of left LE ClCr < 50 ml/min  Goal of Therapy:   Full anticoagulation with Lovenox until surgery completed and Warfarin resumed   Plan: Lovenox 1.5mg  per KG (100mg ) sq q24hrs CBC per protocol  [name] 04/19/2011,7:47 AM

## 2011-04-19 NOTE — Progress Notes (Signed)
Patient ID: [name], female   DOB: [birth year], 41 y.o.   MRN: [id number] Patient is a very complex 41 year old female known to the vascular surgery service. She presented with embolus to her left foot in [name] January 2013 and underwent an arteriogram showing this and the obstruction her tibial vessels. She was taken to the operating room by Dr. Kellie Simmering on January 11 and underwent a left popliteal and tibial thrombectomy. She continues to have footdrop and non-by [name] the lateral aspect of her foot. She essentially underwent fourth and fifth toe amputation. When last seen in our office by Dr. Kellie Simmering in [name] February she was noted to have healing of this and did have a popliteal pulse and also posterior tibial pulse. She subsequently has had to admission with what sounds like cerebral embolus. She had Doppler studies which were negative at [facility]. She was readmitted with worsening left foot pain. She has been transferred to [facility] for further treatment.  Physical exam: Patient is in pain and is unable to straighten out her left knee. This appears to be more from pain and contracture. Her mental status is somewhat blunted I assuming this is from her recent stroke. Her left thigh and left calf are warm she does not have any calf tenderness. She does have a palpable right popliteal pulse I do not palpate a pulse in her left popliteal space. Her blood pressure is 88 systolic. Her left foot is nonviable. She has gangrene of the first second and third toe and has had a dictation of the fourth and fifth toe. She does have modeling up onto her heel and reports heel pain.  Impression and plan: Nonviable left foot with gangrenous changes of first second and third toe and extending up to her heel. He does appear to have a contracture of her left knee. The patient reports this is been going on for several weeks. I do not see any evidence of acute ischemia requiring urgent revascularization. I  did discuss this with the patient and explained that she has a nonviable foot and will require at least a below knee amputation. I have discussed this with Dr. Sharol Given who will see her as well for possible amputation. I feel it is appropriate to continue Lovenox pending the surgical treatment.

## 2011-04-19 NOTE — Progress Notes (Signed)
Called report to [place], RN om [phone] at [facility].  Verbalized understanding. Pt transferred to facility via [place].  Schonewitz, Eulis Canner 04/19/2011

## 2011-04-19 NOTE — Progress Notes (Signed)
NAMECARMISHA, [name]                ACCOUNT NO.:  [id number]  MEDICAL RECORD NO.:  [id number]  LOCATION:  [id number]                          FACILITY:  [facility]  PHYSICIAN:  [name], MD DATE OF BIRTH:  1970-08-28  DATE OF PROCEDURE:  04/19/2011 DATE OF DISCHARGE:                                PROGRESS NOTE   CHIEF COMPLAINT:  Follow up for type 1 diabetes.  SUBJECTIVE:  The patient still does not eat adequately.  She remains on dextrose 5%, report no hypoglycemia.  OBJECTIVE:  GENERAL:  No acute distress. VITAL SIGNS:  Blood pressure 98/60, pulse rate 94, temperature 98.4, breathing rate 20. HEENT:  Well hydrated. CHEST:  Clear to auscultation bilaterally. CARDIOVASCULAR:  Normal S1, S2.  No murmur.  No gallop. ABDOMEN:  Soft and nontender. EXTREMITIES:  Ischemic gangrenous left lower extremity.  LABS:  Blood glucose yesterday morning was 138, at lunch was 173, at supper 223, bedtime 129, this morning.  83.  ASSESSMENT: 1. Type 1 diabetes, difficult to control. 2. Gangrenous left lower extremity with osteomyelitis. 3. Severe peripheral vascular disease.  PLAN:  Until the patient is able to tolerate adequate oral feeding, she will continue to require dextrose 5% at 75 mL per hour.  Encourage oral feeding.  She will still need 10 units of Lantus at bedtime to prevent DKA.  If hyperglycemia becomes a problem 250 mg/dL x3, she will need glucose stabilizer in ICU.          ______________________________ [name], MD     GN/MEDQ  D:  04/19/2011  T:  04/19/2011  Job:  [id number]

## 2011-04-19 NOTE — Progress Notes (Signed)
ANTIBIOTIC CONSULT NOTE - INITIAL  Pharmacy Consult for Zosyn Indication: cellulitis, osteomyelitis  Allergies  Allergen Reactions  . Aspirin Other (See Comments)    Cannot take this medication due to kidney disease  . Ibuprofen Other (See Comments)    Kidney disease    Patient Measurements: Height: 5\' 1"  (154.9 cm) Weight: 149 lb 7.6 oz (67.8 kg) IBW/kg (Calculated) : 47.8   Vital Signs: Temp: 98.5 F (36.9 C) (04/01 1730) Temp src: Oral (04/01 1730) BP: 135/81 mmHg (04/01 1730) Pulse Rate: 95  (04/01 1730) Intake/Output from previous day: 03/31 0701 - 04/01 0700 In: [id number] [P.O.:420; I.V.:825] Out: 1125 [Urine:1125] Intake/Output from this shift: Total I/O In: 380 [P.O.:120; I.V.:260] Out: 200 [Urine:200]  Labs:  Basename 04/19/11 0401  WBC 13.2*  HGB 6.4*  PLT 921*  LABCREA --  CREATININE 1.45*   Estimated Creatinine Clearance: 45 ml/min (by C-G formula based on Cr of 1.45).  Basename 04/19/11 0753  VANCOTROUGH 22.0*  VANCOPEAK --  Jake Michaelis --  GENTTROUGH --  GENTPEAK --  GENTRANDOM --  TOBRATROUGH --  TOBRAPEAK --  TOBRARND --  AMIKACINPEAK --  AMIKACINTROU --  AMIKACIN --     Microbiology: Recent Results (from the past 720 hour(s))  MRSA PCR SCREENING     Status: Normal   Collection Time   03/28/11 12:00 AM      Component Value Range Status Comment   MRSA by PCR NEGATIVE  NEGATIVE  Final   URINE CULTURE     Status: Normal   Collection Time   03/29/11  7:30 PM      Component Value Range Status Comment   Specimen Description URINE, CLEAN CATCH   Final    Special Requests NONE   Final    Culture  Setup Time [id number]   Final    Colony Count NO GROWTH   Final    Culture NO GROWTH   Final    Report Status 03/30/2011 FINAL   Final   CULTURE, BLOOD (ROUTINE X 2)     Status: Normal   Collection Time   03/31/11 10:55 AM      Component Value Range Status Comment   Specimen Description BLOOD LEFT HAND   Final    Special Requests BOTTLES  DRAWN AEROBIC ONLY 4CC   Final    Culture NO GROWTH 5 DAYS   Final    Report Status 04/05/2011 FINAL   Final   MRSA PCR SCREENING     Status: Normal   Collection Time   04/05/11 10:42 PM      Component Value Range Status Comment   MRSA by PCR NEGATIVE  NEGATIVE  Final   CULTURE, BLOOD (ROUTINE X 2)     Status: Normal (Preliminary result)   Collection Time   04/13/11  6:34 PM      Component Value Range Status Comment   Specimen Description BLOOD LEFT WRIST   Final    Special Requests     Final    Value: BOTTLES DRAWN AEROBIC AND ANAEROBIC AEB=9CC ANA=4CC   Culture NO GROWTH 2 DAYS   Final    Report Status PENDING   Incomplete     Medical History: Past Medical History  Diagnosis Date  . Shingles     11/2010  . Cellulitis     of face - 12/2010  . Peripheral vascular disease in diabetes mellitus   . Hyperlipidemia   . Tobacco abuse     0.5 - 1ppd x 21 yrs  .  Headache   . Renal disorder     glomerulonephritis  . Blood transfusion   . Anxiety   . Depression   . Peripheral vascular disease   . Diabetes mellitus     IDDM Since Age 30  . Embolism - blood clot   . Renal insufficiency     Assessment: 41 yo F transferred from [facility] with LE gangrene for possible amputation.  Pt was originally started on Vancomycin 3/18 and has had dose adjusted based on trough levels.  MD has asked to start Zosyn as well.  Goal of Therapy:  Renal adjustment of antibiotics  Plan: Zosyn 3.375 gm IV q8h (4 hour infusion).   [employer], Pharm.D., BCPS Clinical Pharmacist Pager [phone] 04/19/2011 6:53 PM

## 2011-04-19 NOTE — Progress Notes (Signed)
Pt. Seen and examined and chart reviewed.     Surgery asked to see this [name]. Old Type I diabetic with severe peripheral vascular disease and recent vascular intervention for thrombectomy in LLE.  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  Clinically she has gangrene of her L forefoot and will likely require amputation.  Clinically I palpate no pulses in her L leg.and she has considerable ischemic pain.  Her L. 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  All of this made more problematic by her continued smoking and cocaine use.  In leu of her multiple medical problems, her brittle diabetes, cardiac disease, anemia and  etc. I think she will be better served where there is vascular surgery available.  I have discussed this with Dr. Luan Pulling who will transfer her.

## 2011-04-19 NOTE — Progress Notes (Signed)
Critical lab values of Hgb 6.4 and platelet count of 921,000 received from lab at 5:15am.  Dr Emilee Hero informed.  No orders received.  Since a presumed slow decline over the past few days the physician most familiar with her & due to see her today will be here within a few hours we will monitor closely for signs of bleeding or clotting related to abnormal labs and address on day shift.

## 2011-04-19 NOTE — Progress Notes (Signed)
Patient ID: [name], female   DOB: 1970/11/23, 41 y.o.   MRN: [id number] [phone]

## 2011-04-19 NOTE — Progress Notes (Signed)
Subjective: She is having more problem with pain in her foot. She received the maximum dose of her pain medication and is still having trouble. She has no other new complaints  Objective: Vital signs in last 24 hours: Temp:  [98.4 F (36.9 C)-99.7 F (37.6 C)] 98.4 F (36.9 C) (04/01 0544) Pulse Rate:  [93-94] 94  (04/01 0544) Resp:  [20] 20  (04/01 0544) BP: (98-106)/(60-67) 98/60 mmHg (04/01 0544) SpO2:  [94 %-97 %] 97 % (04/01 0544) Weight change:  Last BM Date: 04/18/11  Intake/Output from previous day: 03/31 0701 - 04/01 0700 In: 1245 [P.O.:420; I.V.:825] Out: 1125 [Urine:1125]  PHYSICAL EXAM General appearance: alert and moderate distress Resp: clear to auscultation bilaterally Cardio: regular rate and rhythm, S1, S2 normal, no murmur, click, rub or gallop GI: soft, non-tender; bowel sounds normal; no masses,  no organomegaly Extremities: Her foot is definitely ischemic in appearance with blue discoloration in much worse gangrene overlie 72 hours  Lab Results:    Basic Metabolic Panel:  Basename 04/19/11 0401  NA 136  K 3.9  CL 105  CO2 25  GLUCOSE 98  BUN 7  CREATININE 1.45*  CALCIUM 7.8*  MG --  PHOS --   Liver Function Tests: No results found for this basename: AST:2,ALT:2,ALKPHOS:2,BILITOT:2,PROT:2,ALBUMIN:2 in the last 72 hours No results found for this basename: LIPASE:2,AMYLASE:2 in the last 72 hours No results found for this basename: AMMONIA:2 in the last 72 hours CBC:  Basename 04/19/11 0401  WBC 13.2*  NEUTROABS 8.7*  HGB 6.4*  HCT 19.4*  MCV 91.9  PLT 921*   Cardiac Enzymes: No results found for this basename: CKTOTAL:3,CKMB:3,CKMBINDEX:3,TROPONINI:3 in the last 72 hours BNP: No results found for this basename: PROBNP:3 in the last 72 hours D-Dimer: No results found for this basename: DDIMER:2 in the last 72 hours CBG:  Basename 04/19/11 0724 04/19/11 0604 04/18/11 2100 04/18/11 1612 04/18/11 1133 04/18/11 0740  GLUCAP 73 83 129*  223* 173* 138*   Hemoglobin A1C: No results found for this basename: HGBA1C in the last 72 hours Fasting Lipid Panel: No results found for this basename: CHOL,HDL,LDLCALC,TRIG,CHOLHDL,LDLDIRECT in the last 72 hours Thyroid Function Tests: No results found for this basename: TSH,T4TOTAL,FREET4,T3FREE,THYROIDAB in the last 72 hours Anemia Panel: No results found for this basename: VITAMINB12,FOLATE,FERRITIN,TIBC,IRON,RETICCTPCT in the last 72 hours Coagulation:  Basename 04/19/11 0401 04/18/11 0454  LABPROT 21.2* 20.5*  INR 1.80* 1.72*   Urine Drug Screen: Drugs of Abuse     Component Value Date/Time   LABOPIA NONE DETECTED 03/21/2010 2130   COCAINSCRNUR POSITIVE* 03/21/2010 2130   LABBENZ NONE DETECTED 03/21/2010 2130   AMPHETMU NONE DETECTED 03/21/2010 2130   THCU NONE DETECTED 03/21/2010 2130   LABBARB  Value: NONE DETECTED        DRUG SCREEN FOR MEDICAL PURPOSES ONLY.  IF CONFIRMATION IS NEEDED FOR ANY PURPOSE, NOTIFY LAB WITHIN 5 DAYS.        LOWEST DETECTABLE LIMITS FOR URINE DRUG SCREEN Drug Class       Cutoff (ng/mL) Amphetamine      1000 Barbiturate      200 Benzodiazepine   [id number] Tricyclics       [id number] Opiates          300 Cocaine          300 THC              50 03/21/2010 2130    Alcohol Level: No results found for this basename: ETH:2 in the last 72  hours Urinalysis: No results found for this basename: COLORURINE:2,APPERANCEUR:2,LABSPEC:2,PHURINE:2,GLUCOSEU:2,HGBUR:2,BILIRUBINUR:2,KETONESUR:2,PROTEINUR:2,UROBILINOGEN:2,NITRITE:2,LEUKOCYTESUR:2 in the last 72 hours Misc. Labs:  ABGS No results found for this basename: PHART,PCO2,PO2ART,TCO2,HCO3 in the last 72 hours CULTURES Recent Results (from the past 240 hour(s))  CULTURE, BLOOD (ROUTINE X 2)     Status: Normal (Preliminary result)   Collection Time   04/13/11  6:34 PM      Component Value Range Status Comment   Specimen Description BLOOD LEFT WRIST   Final    Special Requests     Final    Value: BOTTLES DRAWN AEROBIC AND  ANAEROBIC AEB=9CC ANA=4CC   Culture NO GROWTH 2 DAYS   Final    Report Status PENDING   Incomplete    Studies/Results: No results found.  Medications:  Prior to Admission:  Prescriptions prior to admission  Medication Sig Dispense Refill  . acetaminophen (TYLENOL) 500 MG tablet Take 1,000 mg by mouth every 6 (six) hours as needed. For pain       . ALPRAZolam (XANAX) 1 MG tablet Take 1 mg by mouth daily as needed. For anxiety      . amLODipine (NORVASC) 2.5 MG tablet Take 1 tablet (2.5 mg total) by mouth daily.  30 tablet  5  . cephALEXin (KEFLEX) 500 MG capsule Take 500 mg by mouth 3 (three) times daily.      . diphenhydrAMINE (BENADRYL) 25 MG tablet Take 25 mg by mouth at bedtime as needed. For sleep      . fish oil-omega-3 fatty acids 1000 MG capsule Take 1 g by mouth daily.      . furosemide (LASIX) 20 MG tablet Take 20 mg by mouth 3 (three) times a week. On Mondays, Wednesdays, and Fridays      . insulin aspart (NOVOLOG) 100 UNIT/ML injection Inject 5 Units into the skin 3 (three) times daily before meals.  1 vial  12  . insulin glargine (LANTUS) 100 UNIT/ML injection Inject 15 Units into the skin at bedtime.  10 mL  12  . Multiple Vitamins-Minerals (MULTIVITAMINS THER. W/MINERALS) TABS Take 1 tablet by mouth daily.        . ondansetron (ZOFRAN-ODT) 8 MG disintegrating tablet Take 8 mg by mouth every 12 (twelve) hours as needed. Nausea      . oxyCODONE-acetaminophen (PERCOCET) 10-325 MG per tablet Take 1 tablet by mouth every 6 (six) hours as needed for pain.  30 tablet  0  . potassium chloride SA (K-DUR,KLOR-CON) 20 MEQ tablet Take 0.5 tablets (10 mEq total) by mouth daily.  7 tablet  0  . pravastatin (PRAVACHOL) 40 MG tablet Take 1 tablet (40 mg total) by mouth daily.  30 tablet  5  . sertraline (ZOLOFT) 50 MG tablet Take 50 mg by mouth daily.       Marland Kitchen warfarin (COUMADIN) 3 MG tablet Take 3 mg by mouth daily.       Scheduled:   . amLODipine  2.5 mg Oral Daily  . ciprofloxacin  500  mg Oral BID  . clopidogrel  75 mg Oral Q breakfast  . enoxaparin (LOVENOX) injection  100 mg Subcutaneous Q24H  . furosemide  20 mg Oral 3 times weekly  . insulin aspart  0-15 Units Subcutaneous Q6H  . insulin glargine  10 Units Subcutaneous QHS  . mulitivitamin with minerals  1 tablet Oral Daily  . omega-3 acid ethyl esters  1 g Oral Daily  . oxyCODONE  15 mg Oral Q6H  . pantoprazole  40 mg Oral Q1200  .  potassium chloride SA  20 mEq Oral TID  . sertraline  50 mg Oral Daily  . simvastatin  20 mg Oral q1800  . sodium chloride      . sodium chloride      . vancomycin  1,000 mg Intravenous Q24H  . DISCONTD: enoxaparin (LOVENOX) injection  1.5 mg/kg Subcutaneous Q24H  . DISCONTD: warfarin  4 mg Oral ONCE-1800  . DISCONTD: Warfarin - Pharmacist Dosing Inpatient   Does not apply q1800   Continuous:   . dextrose 75 mL/hr at 04/19/11 0415   [id number], ALPRAZolam, dextrose, dextrose, dextrose, diphenhydrAMINE, morphine injection, ondansetron, DISCONTD:  morphine injection  Assesment: She has an ischemic foot. She probably has osteomyelitis. She has diabetes. She has had a thromboembolism of the left lower extremity in what appeared to be cerebral embolus. She is anemic this morning and will need to have blood before we can contemplate having surgery done Principal Problem:  *Osteomyelitis of foot, left, acute Active Problems:  Diabetes mellitus  Peripheral vascular disease in diabetes mellitus  Hyperlipidemia  Thromboembolism of lower extremity artery  Cellulitis of left foot  Cerebral embolus with cerebral infarction  Delirium    Plan: I have asked for orthopedic and Gen. surgery consultation I will give her some blood today I have switched her from her Coumadin to Lovenox for DVT prophylaxis until her surgery is done    LOS: 14 days   Noble Bodie L 04/19/2011, 8:36 AM

## 2011-04-19 NOTE — Progress Notes (Signed)
Patient ID: [name]  female  [id number]    DOB: 12-Apr-1970    DOA: 04/05/2011  PCP: Alonza Bogus, MD, MD  Interim summary Patient is a 41 year old female with history of diabetes mellitus, left foot ulcer, peripheral arterial disease, coronary artery disease, on Coumadin prior to admission had been initially admitted to [facility] in March 2013 under Dr. Luan Pulling. Patient has a history of severe peripheral vascular disease and recent vascular intervention for thrombectomy in the left lower extremity. 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  Subjective: Patient is currently crying with pain  Objective: Weight change:   Intake/Output Summary (Last 24 hours) at 04/19/11 1743 Last data filed at 04/19/11 1455  Gross per 24 hour  Intake   1505 ml  Output   1325 ml  Net    180 ml   Blood pressure 90/53, pulse 80, temperature 98.6 F (37 C), temperature source Oral, resp. rate 16, height 5\' 1"  (1.549 m), weight 67.8 kg (149 lb 7.6 oz), last menstrual period 12/28/2010, SpO2 97.00%.  Physical Exam: General: Alert and awake, oriented x3, acute distress skin 3 to pain. HEENT: anicteric sclera, pupils reactive to light and accommodation, EOMI CVS: S1-S2 clear, no murmur rubs or gallops Chest: clear to auscultation bilaterally, no wheezing, rales or rhonchi Abdomen: soft nontender, nondistended, normal bowel sounds, no organomegaly Extremities: Left foot ischemic in appearance with blue discoloration, significant edema to the thighs Neuro: Cranial nerves II-XII intact, no focal neurological deficits  Lab Results: Basic Metabolic Panel:  Lab [id number] 0401 04/16/11 0547  NA 136  140  K 3.9 4.1  CL 105 111  CO2 25 22  GLUCOSE 98 38*  BUN 7 8  CREATININE 1.45* 1.27*  CALCIUM 7.8* 8.3*  MG -- --  PHOS -- --    Lab 04/15/11 1102  AMMONIA 23   CBC:  Lab 04/19/11 0401  WBC 13.2*  NEUTROABS 8.7*  HGB 6.4*  HCT 19.4*  MCV 91.9  PLT 921*   CBG:  Lab 04/19/11 1729 04/19/11 1150 04/19/11 1109 04/19/11 0724 04/19/11 0604  GLUCAP 36* 61* 67* 73 83     Micro Results: Recent Results (from the past 240 hour(s))  CULTURE, BLOOD (ROUTINE X 2)     Status: Normal (Preliminary result)   Collection Time   04/13/11  6:34 PM      Component Value Range Status Comment   Specimen Description BLOOD LEFT WRIST   Final    Special Requests     Final    Value: BOTTLES DRAWN AEROBIC AND ANAEROBIC AEB=9CC ANA=4CC   Culture NO GROWTH 2 DAYS   Final    Report Status PENDING   Incomplete     Studies/Results: Dg Hip Complete Left  04/15/2011  *RADIOLOGY REPORT*  Clinical Data: Left hip pain following a fall.  LEFT HIP - COMPLETE 2+ VIEW  Comparison: None.  Findings: Normal appearing left hip and pelvic bones.  No fracture or dislocation.  IMPRESSION: Normal examination.  Original Report Authenticated By: Gerald Stabs, M.D.   Dg Femur Left  04/05/2011  *RADIOLOGY REPORT*  Clinical Data: Status post fall.  Pain.  LEFT FEMUR - 2 VIEW  Comparison: None.  Findings: No acute bony or  joint abnormality is identified.  The hip and knee are located.  Soft tissues are unremarkable.  IMPRESSION: Negative exam.  Original Report Authenticated By: Arvid Right. D'ALESSIO, M.D.   Dg Tibia/fibula Left  04/05/2011  *RADIOLOGY REPORT*  Clinical Data: Recent fall.  LEFT TIBIA AND FIBULA - 2 VIEW  Comparison: None.  Findings: There are no fractures or dislocations.  The bones appear intrinsically normal as do the soft tissues.  IMPRESSION: Negative study.  Original Report Authenticated By: Altamese Cabal, M.D.   Ct Head Wo Contrast  04/12/2011  *RADIOLOGY REPORT*  Clinical Data: Confusion with  hallucinations.  History of diabetes.  CT HEAD WITHOUT CONTRAST  Technique:  Contiguous axial images were obtained from the base of the skull through the vertex without contrast.  Comparison: 11/14/2007.  Findings: There is no acute hemorrhage, or mass lesion.  There is an asymmetric hypodensity affecting the right MCA/lenticulostriate territory involving the right head of caudate nucleus and adjacent white matter.  There is no hemorrhage or mass effect.  This could represent an acute or subacute infarct. Similar hypodensities affects the right anterior frontal and right medial frontal parasagittal cortex (arrows);  right internal carotid artery territory acute or subacute infarction not excluded. If there are no contraindications, consider MRI for further evaluation.  No hydrocephalus or extra-axial fluid.  Mild baseline atrophy similar to 2009 scan.  Mild chronic microvascular ischemic change. Intact calvarium.  Clear sinuses and mastoids.  Moderate vascular calcification.  IMPRESSION: Findings suggesting acute or subacute ischemia in the right ICA territory, affecting the right frontal lobe and right basal ganglia, without hemorrhage or mass lesion.  Consider MRI brain without contrast as well as MRA intracranial for further evaluation if there are no contraindications. .  Original Report Authenticated By: Staci Righter, M.D.   Mr [facility] Wo Contrast  04/12/2011  *RADIOLOGY REPORT*  Clinical Data:  Confusion, diabetes, abnormal CT head.  MRI HEAD WITHOUT CONTRAST MRA HEAD WITHOUT CONTRAST  Technique:  Multiplanar, multiecho pulse sequences of the brain and surrounding structures were obtained without intravenous contrast. Angiographic images of the head were obtained using MRA technique without contrast.  Comparison:  CT head 04/12/2011  MRI HEAD  Findings:  Image quality degraded by motion.  There is motion on diffusion weighted imaging which was repeated.  Repeat imaging also has motion.  Despite motion,  there is evidence of acute infarct in various vascular territories.  Small area acute infarct in the right inferior cerebellum.  Small area of acute infarct in the head of the caudate on the right and in the right putamen.  Small acute infarcts  in the right posterior temporal lobe and in the right parietal white matter.  Acute infarct also present in the right medial frontal lobe in the anterior cerebral artery territory.  No acute infarct on the left.  Ventricle size is normal.  Negative for hemorrhage or mass lesion. No significant chronic ischemic changes are present.  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  These involve different vascular territories and are suggestive of arterial emboli.  MRA HEAD  Findings: Image quality degraded by motion.  The patient moved midway through the  the exam causing misregistration artifact.  Both vertebral arteries are patent to the basilar.  Left vertebral artery is patent.  PICA is patent bilaterally.  Basilar artery is patent.  Superior cerebellar and posterior cerebral arteries are patent bilaterally.  Limited visualization of the distal posterior cerebral arteries due to artifact from motion.  Internal carotid artery is patent bilaterally.  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 Anterior and middle cerebral arteries are patent but poorly visualized due to motion.  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  IMPRESSION: Limited study due to motion.  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 Hypoplastic left A1 segment.  Intracranial circulation not well evaluated on this study.  Original Report Authenticated By: Truett Perna, M.D.   Mr Brain Wo Contrast  04/12/2011  *RADIOLOGY REPORT*  Clinical Data:  Confusion, diabetes, abnormal CT head.  MRI HEAD  WITHOUT CONTRAST MRA HEAD WITHOUT CONTRAST  Technique:  Multiplanar, multiecho pulse sequences of the brain and surrounding structures were obtained without intravenous contrast. Angiographic images of the head were obtained using MRA technique without contrast.  Comparison:  CT head 04/12/2011  MRI HEAD  Findings:  Image quality degraded by motion.  There is motion on diffusion weighted imaging which was repeated.  Repeat imaging also has motion.  Despite motion, there is evidence of acute infarct in various vascular territories.  Small area acute infarct in the right inferior cerebellum.  Small area of acute infarct in the head of the caudate on the right and in the right putamen.  Small acute infarcts  in the right posterior temporal lobe and in the right parietal white matter.  Acute infarct also present in the right medial frontal lobe in the anterior cerebral artery territory.  No acute infarct on the left.  Ventricle size is normal.  Negative for hemorrhage or mass lesion. No significant chronic ischemic changes are present.  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  These involve different vascular territories and are suggestive of arterial emboli.  MRA HEAD  Findings: Image quality degraded by motion.  The patient moved midway through the  the exam causing misregistration artifact.  Both vertebral arteries are patent to the basilar.  Left vertebral artery is patent.  PICA is patent bilaterally.  Basilar artery is patent.  Superior cerebellar and posterior cerebral arteries are patent bilaterally.  Limited visualization of the distal posterior cerebral arteries due to artifact from motion.  Internal carotid artery is patent bilaterally.  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 Anterior and middle cerebral arteries are patent but poorly visualized  due to motion.  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  IMPRESSION: Limited study due to motion.  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 Hypoplastic left A1 segment.  Intracranial circulation not well evaluated on this study.  Original Report Authenticated By: Truett Perna, M.D.   Ct Foot Left Wo Contrast  04/06/2011  *RADIOLOGY REPORT*  Clinical Data: Diabetic with left foot pain.  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  Recent fall.  CT OF THE LEFT FOOT WITHOUT CONTRAST  Technique:  Multidetector CT imaging was performed according to the standard protocol. Multiplanar CT image reconstructions were also generated.  Comparison: Radiographs performed 04/05/2011.  No other prior studies available.  Findings: There is moderate generalized osteopenia.  Patient is status post amputation of the fourth and fifth toes.  There are erosions of the fourth and fifth metatarsal heads.  The additional metatarsals and digits appear normal.  The  alignment is normal at the Lisfranc joint.  There is no evidence of navicular fracture or cuboid erosion.  No focal hind foot abnormalities are seen.  There is subcutaneous edema throughout the foot.  No focal fluid collections are identified.  There are age advanced vascular calcifications.  No tendon abnormalities are seen.  IMPRESSION:  1.  No evidence of acute fracture or dislocation. 2.  Nonspecific erosions of the fourth and fifth metatarsal heads status post reported recent amputation of the corresponding toes. These erosions could be arthropathic, postsurgical or secondary to osteomyelitis.  Correlate clinically. 3.  Generalized osteopenia.  This limits osseous evaluation by CT.  MRI may be helpful for further evaluation if infection is a clinical concern.  Original Report Authenticated By: Vivia Ewing, M.D.   Mr Foot Left Wo Contrast  04/07/2011  *RADIOLOGY REPORT*  Clinical  Data: Left foot pain.  Abnormal CT scan.  Recent amputation of the fourth and fifth toes.  MRI OF THE LEFT FOREFOOT WITHOUT CONTRAST  Technique:  Multiplanar, multisequence MR imaging was performed. No intravenous contrast was administered.  Comparison: 04/06/2011  Findings: The the patient refused IV contrast administration.  I reviewed Dr. Jess Barters operative note from 03/03/2011.  Based on the note, the resection was at the level of the fourth and fifth metatarsal necks because there was inadequate healthy tissue overlying the amputation site to spare of the metatarsal heads. Accordingly, the absence of the distal metatarsal heads is an expected postoperative finding, and the low-level edema in the adjacent metatarsal neck is a reasonable expectation for 1 month postoperative appearance of resection at the level of the distal metatarsal neck.  Thus we do not demonstrate specific indicators of osteomyelitis in the fourth and fifth digits.  There is low-level abnormal edema signal in the distal phalanx of the great toe which may represent osteomyelitis, for example on images five through seven of series 8.  Low-level edema in the plantar portion of the medial cuneiform is present, favoring mild stress reaction over infectious process.  Dorsal subcutaneous edema is present in the foot along with edema tracking along the plantar musculature of the foot.  IMPRESSION:  1.  The patient's prior amputation of the fourth and fifth digits were at the level of the metatarsal neck.  The small amount of residual edema in the adjacent marrow is compatible with a postoperative finding, and not specific for osteomyelitis. 2.  However, there is low-level abnormal edema in the distal phalanx of the great toe, which could represent mild osteomyelitis. This may warrant careful observation radiographically. 3.  Dorsal subcutaneous edema in the foot - cellulitis is not excluded.  There is also abnormal edema tracking along the plantar  musculature of the foot possibly reflecting myositis. 4.  The patient refused IV contrast. 5.  Low-level edema inferiorly in the medial cuneiform favors a stress reaction over infection.  Original Report Authenticated By: Carron Curie, M.D.   US Carotid Duplex Bilateral  04/14/2011  *RADIOLOGY REPORT*  Clinical Data: Acute CVA.  BILATERAL CAROTID DUPLEX ULTRASOUND  Technique: Pearline Cables scale imaging, color Doppler and duplex ultrasound was performed of bilateral carotid and vertebral arteries in the neck.  Comparison:  MRI 04/12/2011  Criteria:  Quantification of carotid stenosis is based on velocity parameters that correlate the residual internal carotid diameter with NASCET-based stenosis levels, using the diameter of the distal internal carotid lumen as the denominator for stenosis measurement.  The following velocity measurements were obtained:  PEAK SYSTOLIC/END DIASTOLIC RIGHT ICA:                        115cm/sec CCA:                        [id number] SYSTOLIC ICA/CCA RATIO:     1.1 DIASTOLIC ICA/CCA RATIO:    1.4 ECA:                        156cm/sec  LEFT ICA:                        122cm/sec CCA:                        [id number] SYSTOLIC ICA/CCA RATIO:     0.8 DIASTOLIC ICA/CCA RATIO:    1.5 ECA:                        168cm/sec  Findings:  RIGHT CAROTID ARTERY: Intimal thickening in the right common carotid artery.  A small amount of plaque in the distal carotid bulb.  Minimal plaque in the proximal right internal carotid artery.  Normal velocities and waveforms in the right internal carotid artery.  RIGHT VERTEBRAL ARTERY:  Antegrade flow and normal waveform in the right vertebral artery.  LEFT CAROTID ARTERY: There is some intimal thickening in the left common carotid artery.  Left carotid arteries are patent without significant plaque or stenosis.  LEFT VERTEBRAL ARTERY:  Antegrade flow and normal waveform in the left vertebral artery.  IMPRESSION: Minimal atherosclerotic disease  in the carotid arteries.  No significant carotid artery stenosis.  Estimated degree of stenosis in the internal carotid arteries is less than 50% bilaterally.  Original Report Authenticated By: Markus Daft, M.D.   Nm Myocar Single W/spect W/wall Motion And Ef  04/09/2011  *RADIOLOGY REPORT*  Clinical Data:  Abnormal EKG, chest discomfort  MYOCARDIAL IMAGING WITH SPECT (REST AND PHARMACOLOGIC-STRESS) GATED LEFT VENTRICULAR WALL MOTION STUDY LEFT VENTRICULAR EJECTION FRACTION  Technique:  Standard myocardial SPECT imaging was performed after resting intravenous injection of 12/10 mCi Tc-39m Myoview. Subsequently, intravenous infusion of Lexiscan was performed under the supervision of the Cardiology staff.  At peak effect of the drug, 30 mCi Tc-73m Myoview was injected intravenously and standard myocardial SPECT imaging was performed.  Quantitative gated imaging was also performed to evaluate left ventricular wall motion and estimate left ventricular ejection fraction.  Comparison:  03/12/2011  Findings:  Spect:  Decreased activity of the anterior apical wall is more pronounced on the rest views than the stress more compatible with breast attenuation artifact.  No definite inducible or reversible ischemia with pharmacologic stress.  Wall motion:  Normal wall motion and thickening  Ejection fraction:  Calculated Q G S ejection fraction is 78%.  IMPRESSION: Breast attenuation of the anterior apical wall.  No definite inducible or reversible ischemia with pharmacologic stress  78% ejection fraction  Original Report Authenticated By: Jerilynn Mages. Daryll Brod, M.D.   US Venous Img Lower Unilateral Left  03/31/2011  *RADIOLOGY REPORT*  Clinical Data: Lower extremity pain, left leg pain and swelling  LEFT LOWER EXTREMITY VENOUS DUPLEX ULTRASOUND  Technique:  Gray-scale sonography with graded compression, as well as color Doppler and duplex ultrasound were performed to evaluate the deep venous system of the lower extremity from the  level of the  common femoral vein through the popliteal and proximal calf veins. Spectral Doppler was utilized to evaluate flow at rest and with distal augmentation maneuvers.  Comparison:  None.  Findings:  Normal compressibility of the common femoral, superficial femoral, and popliteal veins is demonstrated, as well as the visualized proximal calf veins.  No filling defects to suggest DVT on grayscale or color Doppler imaging.  Doppler waveforms show normal direction of venous flow, normal respiratory phasicity and response to augmentation. There is subcutaneous edema within the lower extremity.  There is an ill-defined fluid collection medial left knee joint does not appear to represent an abscess.  IMPRESSION: No evidence of lower extremity deep vein thrombosis.  Fluid collection medial to the left knee joint  Original Report Authenticated By: Suzy Bouchard, M.D.   US Venous Img Upper Uni Right  03/31/2011  *RADIOLOGY REPORT*  Clinical data:  Right upper extremity pain and swelling.  Numbness in the fingers of the right hand.  RIGHT UPPER EXTREMITY VENOUS DOPPLER ULTRASOUND 03/31/2011:  Technique:  Gray-scale sonography with compression, as well as color and duplex Doppler ultrasound, were performed to evaluate the deep venous system from the level of  the elbow centrally to include the subclavian vein.  The right internal jugular vein was also evaluated.  Evaluation also included physiologic response to augmentation.  Comparison: None.  Findings: All of the visualized deep veins in the right upper extremity demonstrate normal Doppler signal, normal compressibility, normal phasicity, and normal physiologic response to augmentation.  No gray scale filling defects were identified. The right internal jugular vein is similarly patent.  Note is made of sluggish flow in the brachial and axillary veins.  IMPRESSION: No evidence of right upper extremity DVT.  Original Report Authenticated By: Deniece Portela,  M.D.   Ir US Guide Vasc Access Right  04/08/2011  *RADIOLOGY REPORT*  Clinical Data: Diabetic infected fOOT, access for antibiotics  PICC LINE PLACEMENT WITH ULTRASOUND AND FLUOROSCOPIC  GUIDANCE  Fluoroscopy Time: 0.1 minutes.  The right arm was prepped with chlorhexidine, draped in the usual sterile fashion using maximum barrier technique (cap and mask, sterile gown, sterile gloves, large sterile sheet, hand hygiene and cutaneous antisepsis) and infiltrated locally with 1% Lidocaine.  Ultrasound demonstrated patency of the right basilic vein, and this was documented with an image.  Under real-time ultrasound guidance, this vein was accessed with a 21 gauge micropuncture needle and image documentation was performed.  The needle was exchanged over a guidewire for a peel-away sheath through which a 5 [place] double lumen PICC trimmed to 24 cm was advanced, positioned with its tip at the lower SVC/right atrial junction.  Fluoroscopy during the procedure and fluoro spot radiograph confirms appropriate catheter position.  The catheter was flushed, secured to the skin with Prolene sutures, and covered with a sterile dressing.  Complications:  No immediate  IMPRESSION: Successful right arm PICC line placement with ultrasound and fluoroscopic guidance.  The catheter is ready for use.  Original Report Authenticated By: Jerilynn Mages. Daryll Brod, M.D.   Dg Humerus Right  04/05/2011  *RADIOLOGY REPORT*  Clinical Data: Recent fall, hyperglycemia  RIGHT HUMERUS - 2+ VIEW  Comparison: None.  Findings: No acute fracture is seen.  The right shoulder is unremarkable other than a somewhat downward sloping acromion.  IMPRESSION: No acute fracture.  Original Report Authenticated By: Joretta Bachelor, M.D.   Dg Foot Complete Left  04/05/2011  *RADIOLOGY REPORT*  Clinical Data: History of blood clot left leg.  Fall.  LEFT FOOT - COMPLETE 3+ VIEW  Comparison: None.  Findings: There is been previous amputation of the left fourth and fifth toes  at the metatarsal phalangeal joint level. There is an erosion associated with the dorsal portion of the cuboid bone seen on the lateral view. There is a possible subtle fracture of the medial aspect of the navicular bone versus variant anatomy. Recommend correlation with physical examination findings and possibly CT.  IMPRESSION: Previous amputations of the left fourth and fifth toes at the metatarsophalangeal joint levels. Possible fracture of the navicular bone versus variant anatomy.  Suggest correlation with physical examination findings and possibly CT if felt to be indicated. Erosion associated with the cuboid bone as discussed above.  Original Report Authenticated By: Altamese Cabal, M.D.   Ir Fluoro Guide Cv Midline Picc Right  04/08/2011  *RADIOLOGY REPORT*  Clinical Data: Diabetic infected fOOT, access for antibiotics  PICC LINE PLACEMENT WITH ULTRASOUND AND FLUOROSCOPIC  GUIDANCE  Fluoroscopy Time: 0.1 minutes.  The right arm was prepped with chlorhexidine, draped in the usual sterile fashion using maximum barrier technique (cap and mask, sterile gown, sterile gloves, large sterile sheet, hand hygiene and cutaneous antisepsis) and infiltrated locally with 1% Lidocaine.  Ultrasound demonstrated patency of the right basilic vein, and this was documented with an image.  Under real-time ultrasound guidance, this vein was accessed with a 21 gauge micropuncture needle and image documentation was performed.  The needle was exchanged over a guidewire for a peel-away sheath through which a 5 [place] double lumen PICC trimmed to 24 cm was advanced, positioned with its tip at the lower SVC/right atrial junction.  Fluoroscopy during the procedure and fluoro spot radiograph confirms appropriate catheter position.  The catheter was flushed, secured to the skin with Prolene sutures, and covered with a sterile dressing.  Complications:  No immediate  IMPRESSION: Successful right arm PICC line placement with  ultrasound and fluoroscopic guidance.  The catheter is ready for use.  Original Report Authenticated By: Jerilynn Mages. Daryll Brod, M.D.    Medications: Scheduled Meds:   . amLODipine  2.5 mg Oral Daily  . ciprofloxacin  500 mg Oral BID  . clopidogrel  75 mg Oral Q breakfast  . enoxaparin (LOVENOX) injection  100 mg Subcutaneous Q24H  . furosemide  20 mg Oral 3 times weekly  . insulin aspart  0-15 Units Subcutaneous Q6H  . mulitivitamin with minerals  1 tablet Oral Daily  . omega-3 acid ethyl esters  1 g Oral Daily  . oxyCODONE  15 mg Oral Q6H  . pantoprazole  40 mg Oral Q1200  . potassium chloride SA  20 mEq Oral TID  . sertraline  50 mg Oral Daily  . simvastatin  20 mg Oral q1800  . sodium chloride      . sodium chloride      . vancomycin  750 mg Intravenous Q24H  . DISCONTD: insulin glargine  10 Units Subcutaneous QHS  . DISCONTD: vancomycin  1,000 mg Intravenous Q24H   Continuous Infusions:   . dextrose 75 mL/hr at 04/19/11 0415     Assessment/Plan: Principal Problem:  *Osteomyelitis of foot, left, acute with ischemic foot - Continue Vancomycin - She is likely going to need amputation given the ischemic left lower extremity. I've called for vascular surgery consult, Dr. Donnetta Hutching to see the patient today. - It appears that patient's Coumadin was stopped at [facility] and patient was started on Lovenox. However, patient can be on IV heparin drip while awaiting  the surgery, I have discussed with vascular surgery and defer the decision to vascular surgery. - Continue pain control   Diabetes mellitus: - Currently hypoglycemic, continue D5 drip, discontinued Lantus. Only keep on sliding scale insulin.   Peripheral vascular disease in diabetes mellitus  Hyperlipidemia  Cerebral embolus with cerebral infarction  DVT Prophylaxis: On Lovenox  Code Status: Full code  Disposition: Condition guarded   LOS: 14 days   Mikalyn Hermida M.D. [facility] 04/19/2011, 5:43  PM Pager: [phone]

## 2011-04-19 NOTE — Progress Notes (Signed)
[id number] 

## 2011-04-19 NOTE — Progress Notes (Signed)
Pt is having uncontrollable pain in L foot.  We cannot help this pt until necrotic foot issue is resolved.  PT will be d/c'd.  Please reconsult when medically appropriate.

## 2011-04-20 ENCOUNTER — Other Ambulatory Visit (HOSPITAL_COMMUNITY): Payer: Self-pay | Admitting: Orthopedic Surgery

## 2011-04-20 ENCOUNTER — Encounter (HOSPITAL_COMMUNITY): Payer: Self-pay | Admitting: Cardiology

## 2011-04-20 LAB — CBC
HCT: 29.2 % — ABNORMAL LOW (ref 36.0–46.0)
HCT: 26.8 % — ABNORMAL LOW (ref 36.0–46.0)
Hemoglobin: 9.7 g/dL — ABNORMAL LOW (ref 12.0–15.0)
Hemoglobin: 9 g/dL — ABNORMAL LOW (ref 12.0–15.0)
MCH: 29.8 pg (ref 26.0–34.0)
MCH: 29.8 pg (ref 26.0–34.0)
MCHC: 33.2 g/dL (ref 30.0–36.0)
MCHC: 33.6 g/dL (ref 30.0–36.0)
MCV: 89.8 fL (ref 78.0–100.0)
MCV: 88.7 fL (ref 78.0–100.0)
Platelets: 871 10*3/uL — ABNORMAL HIGH (ref 150–400)
Platelets: 734 10*3/uL — ABNORMAL HIGH (ref 150–400)
RBC: 3.25 MIL/uL — ABNORMAL LOW (ref 3.87–5.11)
RBC: 3.02 MIL/uL — ABNORMAL LOW (ref 3.87–5.11)
RDW: 15.3 % (ref 11.5–15.5)
RDW: 15.6 % — ABNORMAL HIGH (ref 11.5–15.5)
WBC: 14 10*3/uL — ABNORMAL HIGH (ref 4.0–10.5)
WBC: 16.8 10*3/uL — ABNORMAL HIGH (ref 4.0–10.5)

## 2011-04-20 LAB — COMPREHENSIVE METABOLIC PANEL
ALT: 8 U/L (ref 0–35)
AST: 11 U/L (ref 0–37)
Albumin: 0.6 g/dL — ABNORMAL LOW (ref 3.5–5.2)
Alkaline Phosphatase: 93 U/L (ref 39–117)
BUN: 7 mg/dL (ref 6–23)
CO2: 20 mEq/L (ref 19–32)
Calcium: 7 mg/dL — ABNORMAL LOW (ref 8.4–10.5)
Chloride: 108 mEq/L (ref 96–112)
Creatinine, Ser: 1.34 mg/dL — ABNORMAL HIGH (ref 0.50–1.10)
GFR calc Af Amer: 56 mL/min — ABNORMAL LOW (ref >90)
GFR calc non Af Amer: 48 mL/min — ABNORMAL LOW (ref >90)
Glucose, Bld: 283 mg/dL — ABNORMAL HIGH (ref 70–99)
Potassium: 4.5 mEq/L (ref 3.5–5.1)
Sodium: 138 mEq/L (ref 135–145)
Total Bilirubin: <0.1 mg/dL — ABNORMAL LOW (ref 0.3–1.2)
Total Protein: 4.6 g/dL — ABNORMAL LOW (ref 6.0–8.3)

## 2011-04-20 LAB — TYPE AND SCREEN
ABO/RH(D): B POS
Antibody Screen: NEGATIVE
Unit division: 0

## 2011-04-20 LAB — PROTIME-INR
INR: 1.57 — ABNORMAL HIGH (ref 0.00–1.49)
INR: 1.58 — ABNORMAL HIGH (ref 0.00–1.49)
Prothrombin Time: 19.1 seconds — ABNORMAL HIGH (ref 11.6–15.2)
Prothrombin Time: 19.2 seconds — ABNORMAL HIGH (ref 11.6–15.2)

## 2011-04-20 LAB — GLUCOSE, CAPILLARY
Glucose-Capillary: 166 mg/dL — ABNORMAL HIGH (ref 70–99)
Glucose-Capillary: 202 mg/dL — ABNORMAL HIGH (ref 70–99)
Glucose-Capillary: 170 mg/dL — ABNORMAL HIGH (ref 70–99)
Glucose-Capillary: 140 mg/dL — ABNORMAL HIGH (ref 70–99)
Glucose-Capillary: 171 mg/dL — ABNORMAL HIGH (ref 70–99)
Glucose-Capillary: 190 mg/dL — ABNORMAL HIGH (ref 70–99)

## 2011-04-20 LAB — CULTURE, BLOOD (ROUTINE X 2): Culture: NO GROWTH

## 2011-04-20 LAB — HEMOGLOBIN A1C
Hgb A1c MFr Bld: 7.1 % — ABNORMAL HIGH (ref <5.7)
Mean Plasma Glucose: 157 mg/dL — ABNORMAL HIGH (ref <117)

## 2011-04-20 LAB — APTT: aPTT: 45 seconds — ABNORMAL HIGH (ref 24–37)

## 2011-04-20 MED ORDER — DEXTROSE-NACL 5-0.45 % IV SOLN
INTRAVENOUS | Status: DC
  Administered 2011-04-20: 09:00:00 via INTRAVENOUS

## 2011-04-20 MED ORDER — MORPHINE SULFATE (PF) 1 MG/ML IV SOLN
INTRAVENOUS | Status: AC
  Filled 2011-04-20: qty 25

## 2011-04-20 MED ORDER — SODIUM CHLORIDE 0.9 % IV SOLN
INTRAVENOUS | Status: DC
  Administered 2011-04-20: 21:00:00 via INTRAVENOUS

## 2011-04-20 MED ORDER — OXYCODONE HCL 5 MG PO TABS
5.0000 mg | ORAL_TABLET | Freq: Four times a day (QID) | ORAL | Status: DC | PRN
Start: 2011-04-20 — End: 2011-04-26

## 2011-04-20 MED ORDER — INSULIN GLARGINE 100 UNIT/ML ~~LOC~~ SOLN
10.0000 [IU] | Freq: Every day | SUBCUTANEOUS | Status: DC
  Administered 2011-04-21 – 2011-04-22 (×2): 10 [IU] via SUBCUTANEOUS

## 2011-04-20 MED ORDER — ENOXAPARIN SODIUM 40 MG/0.4ML ~~LOC~~ SOLN
40.0000 mg | SUBCUTANEOUS | Status: DC
  Administered 2011-04-21 – 2011-04-23 (×3): 40 mg via SUBCUTANEOUS
  Filled 2011-04-20 (×4): qty 0.4

## 2011-04-20 MED ORDER — BOOST / RESOURCE BREEZE PO LIQD
1.0000 | Freq: Three times a day (TID) | ORAL | Status: DC
  Administered 2011-04-20 – 2011-04-28 (×15): 1 via ORAL

## 2011-04-20 MED ORDER — SODIUM CHLORIDE 0.9 % IV SOLN
INTRAVENOUS | Status: DC
  Administered 2011-04-20 – 2011-04-22 (×4): via INTRAVENOUS

## 2011-04-20 MED ORDER — INSULIN ASPART 100 UNIT/ML ~~LOC~~ SOLN
0.0000 [IU] | Freq: Every day | SUBCUTANEOUS | Status: DC
  Administered 2011-04-26 – 2011-04-27 (×2): 3 [IU] via SUBCUTANEOUS

## 2011-04-20 MED ORDER — INSULIN ASPART 100 UNIT/ML ~~LOC~~ SOLN
0.0000 [IU] | Freq: Three times a day (TID) | SUBCUTANEOUS | Status: DC
  Administered 2011-04-20: 3 [IU] via SUBCUTANEOUS
  Administered 2011-04-20: 2 [IU] via SUBCUTANEOUS
  Administered 2011-04-21: 15 [IU] via SUBCUTANEOUS
  Administered 2011-04-21 – 2011-04-23 (×3): 3 [IU] via SUBCUTANEOUS
  Administered 2011-04-24 – 2011-04-25 (×3): 5 [IU] via SUBCUTANEOUS
  Administered 2011-04-25: 3 [IU] via SUBCUTANEOUS
  Administered 2011-04-26 (×2): 2 [IU] via SUBCUTANEOUS
  Administered 2011-04-26: 3 [IU] via SUBCUTANEOUS
  Administered 2011-04-27: 2 [IU] via SUBCUTANEOUS
  Administered 2011-04-27: 8 [IU] via SUBCUTANEOUS
  Administered 2011-04-28: 2 [IU] via SUBCUTANEOUS
  Administered 2011-04-28: 3 [IU] via SUBCUTANEOUS

## 2011-04-20 MED ORDER — SODIUM CHLORIDE 0.9 % IV BOLUS (SEPSIS)
1000.0000 mL | Freq: Once | INTRAVENOUS | Status: AC
  Administered 2011-04-20: 1000 mL via INTRAVENOUS

## 2011-04-20 MED ORDER — OXYCODONE HCL 5 MG PO TABS: 15.0000 mg | ORAL_TABLET | Freq: Four times a day (QID) | ORAL | Status: DC | PRN

## 2011-04-20 NOTE — Progress Notes (Signed)
[name], [name]                ACCOUNT NO.:  [id number]  MEDICAL RECORD NO.:  [id number]  LOCATION:                                 FACILITY:  PHYSICIAN:  Charan Prieto L. Luan Pulling, M.D.DATE OF BIRTH:  Apr 15, 1970  DATE OF PROCEDURE: DATE OF DISCHARGE:                                PROGRESS NOTE   I had discussed her situation with Dr. Luna Glasgow, orthopedist and he suggested consultation with Dr. Romona Curls, the general surgeon.  Dr. Romona Curls has seen [name] and feels like it would not be appropriate to attempt her surgery here.  Today, her foot is more cool, I do not feel much of a pulse in the foot anymore and Dr. Romona Curls said he did not feel pulses at her popliteal area, but I did earlier this morning. Clearly, she is having more vascular problem.  I discussed her problem with Dr. Mamie Nick, her vascular surgeon who did the thrombectomy earlier and he said that he would be glad to see her in consult when she gets transferred.  I discussed her situation with Dr. 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  She was more anemic and is having blood transfusion.  Her pain is better controlled with 6 mg of morphine every 2 hours as needed. I discussed all this with her daughter and she understands.     Liliauna Santoni L. Luan Pulling, M.D.     EL[facility]/MEDQ  D:  04/19/2011  T:  04/19/2011  Job:  [id number]

## 2011-04-20 NOTE — Progress Notes (Signed)
Patient's BP (manual) 88/46.  O2 sats 97% and HR 67. MD notified.  New orders received.  Will continue to monitor.

## 2011-04-20 NOTE — Progress Notes (Addendum)
Patient ID: [name]  female  [id number]    DOB: 06/06/1970    DOA: 04/05/2011  PCP: Alonza Bogus, MD, MD  Interim summary Patient is a 41 year old female with history of diabetes mellitus, left foot ulcer, peripheral arterial disease, coronary artery disease, on Coumadin prior to admission had been initially admitted to [facility] in March 2013 under Dr. Luan Pulling. Patient has a history of severe peripheral vascular disease and recent vascular intervention for thrombectomy in the left lower extremity. 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 Patient was started on morphine PCA for the acute pain in left lower extremity which she's tolerating. Vascular surgery consult was obtained and patient was seen by Dr. Donnetta Hutching who also discussed with orthopedics, Dr. Sharol Given. Patient will need amputation/ left BKA, possibly Thursday or Friday. Patient has been cleared by cardiology service today to undergo the amputation surgery.   Subjective: Pain is somewhat better controlled today after the PCA, she does not want solid diet. No change in appearance of the left foot.   Objective: Weight change:   Intake/Output Summary (Last 24 hours) at 04/20/11 1122 Last data filed at 04/20/11 0100  Gross per 24 hour  Intake    620 ml  Output    300 ml  Net    320 ml   Blood pressure 102/65, pulse 81, temperature 99.1 F (37.3 C), temperature source Oral, resp. rate 12, height 5\' 1"  (1.549 m), weight 67.8 kg (149 lb 7.6 oz), last menstrual period 12/28/2010, SpO2 97.00%.  Physical Exam: General: Alert and awake, oriented x3,  HEENT: anicteric sclera, pupils reactive to light and accommodation, EOMI CVS: S1-S2 clear, no murmur rubs or gallops Chest:  clear to auscultation bilaterally, no wheezing, rales or rhonchi Abdomen: soft nontender, nondistended, normal bowel sounds, no organomegaly Extremities: Left foot ischemic in appearance with blue discoloration, significant edema to the thighs  Lab Results: Basic Metabolic Panel:  Lab [id number] 0401 04/16/11 0547  NA 136 140  K 3.9 4.1  CL 105 111  CO2 25 22  GLUCOSE 98 38*  BUN 7 8  CREATININE 1.45* 1.27*  CALCIUM 7.8* 8.3*  MG -- --  PHOS -- --    Lab 04/15/11 1102  AMMONIA 23   CBC:  Lab 04/20/11 0515 04/19/11 0401  WBC 14.0* 13.2*  NEUTROABS -- 8.7*  HGB 9.7* 6.4*  HCT 29.2* 19.4*  MCV 89.8 91.9  PLT 871* 921*   CBG:  Lab 04/20/11 0751 04/20/11 0636 04/20/11 0400 04/19/11 2346 04/19/11 2014  GLUCAP 170* 202* 166* 142* 116*     Micro Results: Recent Results (from the past 240 hour(s))  CULTURE, BLOOD (ROUTINE X 2)     Status: Normal   Collection Time   04/13/11  6:34 PM      Component Value Range Status Comment   Specimen Description BLOOD LEFT WRIST   Final    Special Requests     Final    Value: BOTTLES DRAWN AEROBIC AND ANAEROBIC AEB=9CC ANA=4CC   Culture NO GROWTH 7 DAYS   Final    Report Status 04/20/2011 FINAL   Final     Studies/Results: Dg Hip Complete Left  04/15/2011  *RADIOLOGY REPORT*  Clinical Data: Left hip pain following a fall.  LEFT HIP -  COMPLETE 2+ VIEW  Comparison: None.  Findings: Normal appearing left hip and pelvic bones.  No fracture or dislocation.  IMPRESSION: Normal examination.  Original Report Authenticated By: Gerald Stabs, M.D.   Dg Femur Left  04/05/2011  *RADIOLOGY REPORT*  Clinical Data: Status post fall.  Pain.  LEFT FEMUR - 2 VIEW  Comparison: None.  Findings: No acute bony or joint abnormality is identified.  The hip and knee are located.  Soft tissues are unremarkable.  IMPRESSION: Negative exam.  Original Report Authenticated By: Arvid Right. D'ALESSIO, M.D.   Dg Tibia/fibula Left  04/05/2011  *RADIOLOGY REPORT*   Clinical Data: Recent fall.  LEFT TIBIA AND FIBULA - 2 VIEW  Comparison: None.  Findings: There are no fractures or dislocations.  The bones appear intrinsically normal as do the soft tissues.  IMPRESSION: Negative study.  Original Report Authenticated By: Altamese Cabal, M.D.   Ct Head Wo Contrast  04/12/2011  *RADIOLOGY REPORT*  Clinical Data: Confusion with hallucinations.  History of diabetes.  CT HEAD WITHOUT CONTRAST  Technique:  Contiguous axial images were obtained from the base of the skull through the vertex without contrast.  Comparison: 11/14/2007.  Findings: There is no acute hemorrhage, or mass lesion.  There is an asymmetric hypodensity affecting the right MCA/lenticulostriate territory involving the right head of caudate nucleus and adjacent white matter.  There is no hemorrhage or mass effect.  This could represent an acute or subacute infarct. Similar hypodensities affects the right anterior frontal and right medial frontal parasagittal cortex (arrows);  right internal carotid artery territory acute or subacute infarction not excluded. If there are no contraindications, consider MRI for further evaluation.  No hydrocephalus or extra-axial fluid.  Mild baseline atrophy similar to 2009 scan.  Mild chronic microvascular ischemic change. Intact calvarium.  Clear sinuses and mastoids.  Moderate vascular calcification.  IMPRESSION: Findings suggesting acute or subacute ischemia in the right ICA territory, affecting the right frontal lobe and right basal ganglia, without hemorrhage or mass lesion.  Consider MRI brain without contrast as well as MRA intracranial for further evaluation if there are no contraindications. .  Original Report Authenticated By: Staci Righter, M.D.   Mr [facility] Wo Contrast  04/12/2011  *RADIOLOGY REPORT*  Clinical Data:  Confusion, diabetes, abnormal CT head.  MRI HEAD WITHOUT CONTRAST MRA HEAD WITHOUT CONTRAST  Technique:  Multiplanar, multiecho pulse sequences of the  brain and surrounding structures were obtained without intravenous contrast. Angiographic images of the head were obtained using MRA technique without contrast.  Comparison:  CT head 04/12/2011  MRI HEAD  Findings:  Image quality degraded by motion.  There is motion on diffusion weighted imaging which was repeated.  Repeat imaging also has motion.  Despite motion, there is evidence of acute infarct in various vascular territories.  Small area acute infarct in the right inferior cerebellum.  Small area of acute infarct in the head of the caudate on the right and in the right putamen.  Small acute infarcts  in the right posterior temporal lobe and in the right parietal white matter.  Acute infarct also present in the right medial frontal lobe in the anterior cerebral artery territory.  No acute infarct on the left.  Ventricle size is normal.  Negative for hemorrhage or mass lesion. No significant chronic ischemic changes are present.  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  These involve different vascular  territories and are suggestive of arterial emboli.  MRA HEAD  Findings: Image quality degraded by motion.  The patient moved midway through the  the exam causing misregistration artifact.  Both vertebral arteries are patent to the basilar.  Left vertebral artery is patent.  PICA is patent bilaterally.  Basilar artery is patent.  Superior cerebellar and posterior cerebral arteries are patent bilaterally.  Limited visualization of the distal posterior cerebral arteries due to artifact from motion.  Internal carotid artery is patent bilaterally.  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 Anterior and middle cerebral arteries are patent but poorly visualized due to motion.  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  IMPRESSION: Limited study due to  motion.  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 Hypoplastic left A1 segment.  Intracranial circulation not well evaluated on this study.  Original Report Authenticated By: Truett Perna, M.D.   Mr Brain Wo Contrast  04/12/2011  *RADIOLOGY REPORT*  Clinical Data:  Confusion, diabetes, abnormal CT head.  MRI HEAD WITHOUT CONTRAST MRA HEAD WITHOUT CONTRAST  Technique:  Multiplanar, multiecho pulse sequences of the brain and surrounding structures were obtained without intravenous contrast. Angiographic images of the head were obtained using MRA technique without contrast.  Comparison:  CT head 04/12/2011  MRI HEAD  Findings:  Image quality degraded by motion.  There is motion on diffusion weighted imaging which was repeated.  Repeat imaging also has motion.  Despite motion, there is evidence of acute infarct in various vascular territories.  Small area acute infarct in the right inferior cerebellum.  Small area of acute infarct in the head of the caudate on the right and in the right putamen.  Small acute infarcts  in the right posterior temporal lobe and in the right parietal white matter.  Acute infarct also present in the right medial frontal lobe in the anterior cerebral artery territory.  No acute infarct on the left.  Ventricle size is normal.  Negative for hemorrhage or mass lesion. No significant chronic ischemic changes are present.  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  These involve different vascular territories and are suggestive of arterial emboli.  MRA HEAD  Findings: Image quality degraded by motion.  The patient moved midway through the  the exam causing misregistration artifact.  Both vertebral arteries are patent to the basilar.  Left vertebral artery is patent.  PICA is patent bilaterally.  Basilar artery is patent.   Superior cerebellar and posterior cerebral arteries are patent bilaterally.  Limited visualization of the distal posterior cerebral arteries due to artifact from motion.  Internal carotid artery is patent bilaterally.  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 Anterior and middle cerebral arteries are patent but poorly visualized due to motion.  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  IMPRESSION: Limited study due to motion.  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 Hypoplastic left A1 segment.  Intracranial circulation not well evaluated on this study.  Original Report Authenticated By: Truett Perna, M.D.   Ct Foot Left Wo Contrast  04/06/2011  *RADIOLOGY REPORT*  Clinical Data: Diabetic with left foot pain.  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  Recent fall.  CT OF THE LEFT FOOT WITHOUT CONTRAST  Technique:  Multidetector CT  imaging was performed according to the standard protocol. Multiplanar CT image reconstructions were also generated.  Comparison: Radiographs performed 04/05/2011.  No other prior studies available.  Findings: There is moderate generalized osteopenia.  Patient is status post amputation of the fourth and fifth toes.  There are erosions of the fourth and fifth metatarsal heads.  The additional metatarsals and digits appear normal.  The alignment is normal at the Lisfranc joint.  There is no evidence of navicular fracture or cuboid erosion.  No focal hind foot abnormalities are seen.  There is subcutaneous edema throughout the foot.  No focal fluid collections are identified.  There are age advanced vascular calcifications.  No tendon abnormalities are seen.  IMPRESSION:  1.  No evidence of acute fracture or dislocation. 2.  Nonspecific erosions of the fourth and fifth metatarsal heads status post reported recent amputation of the corresponding toes. These erosions  could be arthropathic, postsurgical or secondary to osteomyelitis.  Correlate clinically. 3.  Generalized osteopenia.  This limits osseous evaluation by CT.  MRI may be helpful for further evaluation if infection is a clinical concern.  Original Report Authenticated By: Vivia Ewing, M.D.   Mr Foot Left Wo Contrast  04/07/2011  *RADIOLOGY REPORT*  Clinical Data: Left foot pain.  Abnormal CT scan.  Recent amputation of the fourth and fifth toes.  MRI OF THE LEFT FOREFOOT WITHOUT CONTRAST  Technique:  Multiplanar, multisequence MR imaging was performed. No intravenous contrast was administered.  Comparison: 04/06/2011  Findings: The the patient refused IV contrast administration.  I reviewed Dr. Jess Barters operative note from 03/03/2011.  Based on the note, the resection was at the level of the fourth and fifth metatarsal necks because there was inadequate healthy tissue overlying the amputation site to spare of the metatarsal heads. Accordingly, the absence of the distal metatarsal heads is an expected postoperative finding, and the low-level edema in the adjacent metatarsal neck is a reasonable expectation for 1 month postoperative appearance of resection at the level of the distal metatarsal neck.  Thus we do not demonstrate specific indicators of osteomyelitis in the fourth and fifth digits.  There is low-level abnormal edema signal in the distal phalanx of the great toe which may represent osteomyelitis, for example on images five through seven of series 8.  Low-level edema in the plantar portion of the medial cuneiform is present, favoring mild stress reaction over infectious process.  Dorsal subcutaneous edema is present in the foot along with edema tracking along the plantar musculature of the foot.  IMPRESSION:  1.  The patient's prior amputation of the fourth and fifth digits were at the level of the metatarsal neck.  The small amount of residual edema in the adjacent marrow is compatible with a  postoperative finding, and not specific for osteomyelitis. 2.  However, there is low-level abnormal edema in the distal phalanx of the great toe, which could represent mild osteomyelitis. This may warrant careful observation radiographically. 3.  Dorsal subcutaneous edema in the foot - cellulitis is not excluded.  There is also abnormal edema tracking along the plantar musculature of the foot possibly reflecting myositis. 4.  The patient refused IV contrast. 5.  Low-level edema inferiorly in the medial cuneiform favors a stress reaction over infection.  Original Report Authenticated By: Carron Curie, M.D.   US Carotid Duplex Bilateral  04/14/2011  *RADIOLOGY REPORT*  Clinical Data: Acute CVA.  BILATERAL CAROTID DUPLEX ULTRASOUND  Technique: Pearline Cables scale imaging, color Doppler and duplex ultrasound was  performed of bilateral carotid and vertebral arteries in the neck.  Comparison:  MRI 04/12/2011  Criteria:  Quantification of carotid stenosis is based on velocity parameters that correlate the residual internal carotid diameter with NASCET-based stenosis levels, using the diameter of the distal internal carotid lumen as the denominator for stenosis measurement.  The following velocity measurements were obtained:                   PEAK SYSTOLIC/END DIASTOLIC RIGHT ICA:                        115cm/sec CCA:                        [id number] SYSTOLIC ICA/CCA RATIO:     1.1 DIASTOLIC ICA/CCA RATIO:    1.4 ECA:                        156cm/sec  LEFT ICA:                        122cm/sec CCA:                        [id number] SYSTOLIC ICA/CCA RATIO:     0.8 DIASTOLIC ICA/CCA RATIO:    1.5 ECA:                        168cm/sec  Findings:  RIGHT CAROTID ARTERY: Intimal thickening in the right common carotid artery.  A small amount of plaque in the distal carotid bulb.  Minimal plaque in the proximal right internal carotid artery.  Normal velocities and waveforms in the right internal carotid artery.  RIGHT VERTEBRAL  ARTERY:  Antegrade flow and normal waveform in the right vertebral artery.  LEFT CAROTID ARTERY: There is some intimal thickening in the left common carotid artery.  Left carotid arteries are patent without significant plaque or stenosis.  LEFT VERTEBRAL ARTERY:  Antegrade flow and normal waveform in the left vertebral artery.  IMPRESSION: Minimal atherosclerotic disease in the carotid arteries.  No significant carotid artery stenosis.  Estimated degree of stenosis in the internal carotid arteries is less than 50% bilaterally.  Original Report Authenticated By: Markus Daft, M.D.   Nm Myocar Single W/spect W/wall Motion And Ef  04/09/2011  *RADIOLOGY REPORT*  Clinical Data:  Abnormal EKG, chest discomfort  MYOCARDIAL IMAGING WITH SPECT (REST AND PHARMACOLOGIC-STRESS) GATED LEFT VENTRICULAR WALL MOTION STUDY LEFT VENTRICULAR EJECTION FRACTION  Technique:  Standard myocardial SPECT imaging was performed after resting intravenous injection of 12/10 mCi Tc-110m Myoview. Subsequently, intravenous infusion of Lexiscan was performed under the supervision of the Cardiology staff.  At peak effect of the drug, 30 mCi Tc-40m Myoview was injected intravenously and standard myocardial SPECT imaging was performed.  Quantitative gated imaging was also performed to evaluate left ventricular wall motion and estimate left ventricular ejection fraction.  Comparison:  03/12/2011  Findings:  Spect:  Decreased activity of the anterior apical wall is more pronounced on the rest views than the stress more compatible with breast attenuation artifact.  No definite inducible or reversible ischemia with pharmacologic stress.  Wall motion:  Normal wall motion and thickening  Ejection fraction:  Calculated Q G S ejection fraction is 78%.  IMPRESSION: Breast attenuation of the anterior apical wall.  No definite inducible or reversible ischemia with pharmacologic stress  78% ejection fraction  Original Report Authenticated By: Jerilynn Mages. Daryll Brod, M.D.    US Venous Img Lower Unilateral Left  03/31/2011  *RADIOLOGY REPORT*  Clinical Data: Lower extremity pain, left leg pain and swelling  LEFT LOWER EXTREMITY VENOUS DUPLEX ULTRASOUND  Technique:  Gray-scale sonography with graded compression, as well as color Doppler and duplex ultrasound were performed to evaluate the deep venous system of the lower extremity from the level of the common femoral vein through the popliteal and proximal calf veins. Spectral Doppler was utilized to evaluate flow at rest and with distal augmentation maneuvers.  Comparison:  None.  Findings:  Normal compressibility of the common femoral, superficial femoral, and popliteal veins is demonstrated, as well as the visualized proximal calf veins.  No filling defects to suggest DVT on grayscale or color Doppler imaging.  Doppler waveforms show normal direction of venous flow, normal respiratory phasicity and response to augmentation. There is subcutaneous edema within the lower extremity.  There is an ill-defined fluid collection medial left knee joint does not appear to represent an abscess.  IMPRESSION: No evidence of lower extremity deep vein thrombosis.  Fluid collection medial to the left knee joint  Original Report Authenticated By: Suzy Bouchard, M.D.   US Venous Img Upper Uni Right  03/31/2011  *RADIOLOGY REPORT*  Clinical data:  Right upper extremity pain and swelling.  Numbness in the fingers of the right hand.  RIGHT UPPER EXTREMITY VENOUS DOPPLER ULTRASOUND 03/31/2011:  Technique:  Gray-scale sonography with compression, as well as color and duplex Doppler ultrasound, were performed to evaluate the deep venous system from the level of  the elbow centrally to include the subclavian vein.  The right internal jugular vein was also evaluated.  Evaluation also included physiologic response to augmentation.  Comparison: None.  Findings: All of the visualized deep veins in the right upper extremity demonstrate normal Doppler  signal, normal compressibility, normal phasicity, and normal physiologic response to augmentation.  No gray scale filling defects were identified. The right internal jugular vein is similarly patent.  Note is made of sluggish flow in the brachial and axillary veins.  IMPRESSION: No evidence of right upper extremity DVT.  Original Report Authenticated By: Deniece Portela, M.D.   Ir US Guide Vasc Access Right  04/08/2011  *RADIOLOGY REPORT*  Clinical Data: Diabetic infected fOOT, access for antibiotics  PICC LINE PLACEMENT WITH ULTRASOUND AND FLUOROSCOPIC  GUIDANCE  Fluoroscopy Time: 0.1 minutes.  The right arm was prepped with chlorhexidine, draped in the usual sterile fashion using maximum barrier technique (cap and mask, sterile gown, sterile gloves, large sterile sheet, hand hygiene and cutaneous antisepsis) and infiltrated locally with 1% Lidocaine.  Ultrasound demonstrated patency of the right basilic vein, and this was documented with an image.  Under real-time ultrasound guidance, this vein was accessed with a 21 gauge micropuncture needle and image documentation was performed.  The needle was exchanged over a guidewire for a peel-away sheath through which a 5 [place] double lumen PICC trimmed to 24 cm was advanced, positioned with its tip at the lower SVC/right atrial junction.  Fluoroscopy during the procedure and fluoro spot radiograph confirms appropriate catheter position.  The catheter was flushed, secured to the skin with Prolene sutures, and covered with a sterile dressing.  Complications:  No immediate  IMPRESSION: Successful right arm PICC line placement with ultrasound and fluoroscopic guidance.  The catheter is ready for use.  Original Report Authenticated By: Jerilynn Mages. Daryll Brod, M.D.   Dg Humerus Right  04/05/2011  *RADIOLOGY REPORT*  Clinical Data: Recent fall, hyperglycemia  RIGHT HUMERUS - 2+ VIEW  Comparison: None.  Findings: No acute fracture is seen.  The right shoulder is unremarkable  other than a somewhat downward sloping acromion.  IMPRESSION: No acute fracture.  Original Report Authenticated By: Joretta Bachelor, M.D.   Dg Foot Complete Left  04/05/2011  *RADIOLOGY REPORT*  Clinical Data: History of blood clot left leg.  Fall.  LEFT FOOT - COMPLETE 3+ VIEW  Comparison: None.  Findings: There is been previous amputation of the left fourth and fifth toes at the metatarsal phalangeal joint level. There is an erosion associated with the dorsal portion of the cuboid bone seen on the lateral view. There is a possible subtle fracture of the medial aspect of the navicular bone versus variant anatomy. Recommend correlation with physical examination findings and possibly CT.  IMPRESSION: Previous amputations of the left fourth and fifth toes at the metatarsophalangeal joint levels. Possible fracture of the navicular bone versus variant anatomy.  Suggest correlation with physical examination findings and possibly CT if felt to be indicated. Erosion associated with the cuboid bone as discussed above.  Original Report Authenticated By: Altamese Cabal, M.D.   Ir Fluoro Guide Cv Midline Picc Right  04/08/2011  *RADIOLOGY REPORT*  Clinical Data: Diabetic infected fOOT, access for antibiotics  PICC LINE PLACEMENT WITH ULTRASOUND AND FLUOROSCOPIC  GUIDANCE  Fluoroscopy Time: 0.1 minutes.  The right arm was prepped with chlorhexidine, draped in the usual sterile fashion using maximum barrier technique (cap and mask, sterile gown, sterile gloves, large sterile sheet, hand hygiene and cutaneous antisepsis) and infiltrated locally with 1% Lidocaine.  Ultrasound demonstrated patency of the right basilic vein, and this was documented with an image.  Under real-time ultrasound guidance, this vein was accessed with a 21 gauge micropuncture needle and image documentation was performed.  The needle was exchanged over a guidewire for a peel-away sheath through which a 5 [place] double lumen PICC trimmed to 24 cm was  advanced, positioned with its tip at the lower SVC/right atrial junction.  Fluoroscopy during the procedure and fluoro spot radiograph confirms appropriate catheter position.  The catheter was flushed, secured to the skin with Prolene sutures, and covered with a sterile dressing.  Complications:  No immediate  IMPRESSION: Successful right arm PICC line placement with ultrasound and fluoroscopic guidance.  The catheter is ready for use.  Original Report Authenticated By: Jerilynn Mages. Daryll Brod, M.D.    Medications: Scheduled Meds:    . clopidogrel  75 mg Oral Q breakfast  . enoxaparin (LOVENOX) injection  100 mg Subcutaneous Q24H  . insulin aspart  0-15 Units Subcutaneous TID WC  . insulin aspart  0-5 Units Subcutaneous QHS  . insulin glargine  10 Units Subcutaneous QHS  . morphine   Intravenous Q4H  . mulitivitamin with minerals  1 tablet Oral Daily  . omega-3 acid ethyl esters  1 g Oral Daily  . pantoprazole  40 mg Oral Q1200  . piperacillin-tazobactam (ZOSYN)  IV  3.375 g Intravenous Q8H  . potassium chloride SA  20 mEq Oral TID  . sertraline  50 mg Oral Daily  . simvastatin  20 mg Oral q1800  . vancomycin  750 mg Intravenous Q24H  . DISCONTD: amLODipine  2.5 mg Oral Daily  . DISCONTD: ciprofloxacin  500 mg Oral BID  . DISCONTD: furosemide  20 mg Oral 3 times weekly  . DISCONTD: insulin aspart  0-15 Units Subcutaneous Q6H  . DISCONTD:  insulin glargine  10 Units Subcutaneous QHS  . DISCONTD: morphine   Intravenous Q4H  . DISCONTD: morphine   Intravenous Q4H  . DISCONTD: morphine   Intravenous Q4H  . DISCONTD: morphine   Intravenous Q4H  . DISCONTD: oxyCODONE  15 mg Oral Q6H   Continuous Infusions:    . dextrose 5 % and 0.45% NaCl 100 mL/hr at 04/20/11 0926  . DISCONTD: dextrose 75 mL/hr at 04/19/11 0415     Assessment/Plan: Principal Problem:  *Osteomyelitis of foot, left, acute with ischemic foot - Continue Vancomycin and Zosyn - Left BKA Thursday/Friday per Dr. Jess Barters  schedule. Cleared for the surgery by cardiology service today.  - Appreciate vascular surgery and orthopedics following the patient, defer further management to them. -  Pain better controlled with morphine PCA, changed oxycodone PO to PRN for breakthrough    Diabetes mellitus:Uncontrolled, Patient was hypoglycemic yesterday,  -  continue D5 half NS drip, added Lantus. Placed on sliding scale insulin, no meal coverage as patient's inconsistent with her diet. I am changing her diet to full liquids per her request.     Peripheral vascular disease in diabetes mellitus: On Plavix, aspirin surgery following   Hyperlipidemia: On lovaza   Cerebral embolus with cerebral infarction: On Plavix, Coumadin on hold for the surgery  DVT Prophylaxis: On Lovenox  Code Status: Full code  Disposition: Awaiting surgery    LOS: 15 days   Laketa Sandoz M.D. [facility] 04/20/2011, 11:22 AM Pager: [phone]   addendum:  I discussed with Dr Donnetta Hutching, vascular surgery, the patient was transferred with full dose Lovenox from [facility]. On reviewing recommendations from Dr. Donnetta Hutching, did not feel that patient had acute ischemic event to the left lower extremity however it is still nonviable due to gangrene and acute osteomyelitis and requires BKA. She is okay for Lovenox for DVT prophylaxis, and does not need full dose therapeutic Lovenox. She had a recent cerebral CVA this admission at [facility], please see Dr. Merlene Laughter (neurology) was noted from 04/15/2011, who recommended continuing Plavix and temporarily warfarin, (?possibly embolic event).  Discussed with pharmacy, Virl Cagey.     Ashtin Rosner M.D. [facility] 04/20/2011, 1:54 PM  Pager: [phone]

## 2011-04-20 NOTE — Progress Notes (Signed)
     CARE MANAGEMENT NOTE 04/20/2011  Patient:  [name], [name]   Account Number:  [id number]  Date Initiated:  04/07/2011  Documentation initiated by:  Theophilus Kinds  Subjective/Objective Assessment:   Pt admitted from home with recurrent cellulitis. 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 Possible osteomylitis. Pt lives with mother and fiance. Pt is able to afford meds. Active with [facility].     Action/Plan:   CM spoke with pt about possible need for IV AB at discharge. Pain control is also an issue at this point.   Anticipated DC Date:  04/14/2011   Anticipated DC Plan:  [place]  In-house referral  Clinical Social Worker      DC Planning Services  CM consult      Choice offered to / List presented to:             [place].   Status of service:  In process, will continue to follow Medicare Important Message given?   (If response is "NO", the following Medicare IM given date fields will be blank) Date Medicare IM given:   Date Additional Medicare IM given:    Discharge Disposition:    Per UR Regulation:  Reviewed for med. necessity/level of care/duration of stay  If discussed at [place] of Stay Meetings, dates discussed:   04/15/2011    Comments:  04/20/2011  [place], [place] [phone] Met with patient for CM introduction, will continue to follow for discharge planning needs.  04/20/11 [name], BSN [place]  04/16/11 1432 Christinia Gully, RN BSN CM CM spoke with pts daughter, Vikki Ports, who states that pt is now for SNF. At this point, the daughter is unable to take care of pt and pts mother who has dementia. However, pt daughter states that she only wants this short term until her mother can get IV AB and PT. MD and CSW is aware. CSW will complete paperwork and look for SNF bed.  04/09/11 [place], RN BSN CM Discussed the need for short term placement for IV AB therapy as  recommended by Dr. Luan Pulling. Pt is agreeable. CSW made aware.  04/07/11 [place], RN BSN CM Pt admitted from home with cellulitis and possible osteomylitis. Pt already active with [facility]. Will resume services at discharge

## 2011-04-20 NOTE — Progress Notes (Signed)
   CARE MANAGEMENT NOTE 04/20/2011  Patient:  [name], [name]   Account Number:  [id number]  Date Initiated:  04/07/2011  Documentation initiated by:  Theophilus Kinds  Subjective/Objective Assessment:   Pt admitted from home with recurrent cellulitis. 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 Possible osteomylitis. Pt lives with mother and fiance. Pt is able to afford meds. Active with [facility].     Action/Plan:   CM spoke with pt about possible need for IV AB at discharge. Pain control is also an issue at this point.   Anticipated DC Date:  04/14/2011   Anticipated DC Plan:  [place]  In-house referral  Clinical Social Worker      DC Planning Services  CM consult      Choice offered to / List presented to:             [place].   Status of service:  In process, will continue to follow Medicare Important Message given?   (If response is "NO", the following Medicare IM given date fields will be blank) Date Medicare IM given:   Date Additional Medicare IM given:    Discharge Disposition:    Per UR Regulation:  Reviewed for med. necessity/level of care/duration of stay  If discussed at [place] of Stay Meetings, dates discussed:   04/15/2011    Comments:  04/20/11 [name], BSN [place]  04/16/11 [place], RN BSN CM CM spoke with pts daughter, Vikki Ports, who states that pt is now for SNF. At this point, the daughter is unable to take care of pt and pts mother who has dementia. However, pt daughter states that she only wants this short term until her mother can get IV AB and PT. MD and CSW is aware. CSW will complete paperwork and look for SNF bed.  04/09/11 [place], RN BSN CM Discussed the need for short term placement for IV AB therapy as recommended by Dr. Luan Pulling. Pt is agreeable. CSW made aware.  04/07/11 [place], RN BSN CM Pt admitted from home with cellulitis and  possible osteomylitis. Pt already active with [facility]. Will resume services at discharge.

## 2011-04-20 NOTE — Consult Note (Signed)
Reason for Consult:gangrene left foot Referring Physician:Dr. [name] is an 41 y.o. female.  HPI: status post foot salvage surgery left foot with new ischemic event, with gangrene of medial column left foot  Past Medical History  Diagnosis Date  . Shingles     11/2010  . Cellulitis     of face - 12/2010  . Peripheral vascular disease in diabetes mellitus   . Hyperlipidemia   . Tobacco abuse     0.5 - 1ppd x 21 yrs  . Headache   . Renal disorder     glomerulonephritis  . Blood transfusion   . Anxiety   . Depression   . Peripheral vascular disease   . Diabetes mellitus     IDDM Since Age 60  . Embolism - blood clot   . Renal insufficiency     Past Surgical History  Procedure Date  . Wrist surgery     left  . Embolectomy 01/29/2011    Procedure: EMBOLECTOMY;  Surgeon: Mal Misty, MD;  Location: [facility] OR;  Service: Vascular;  Laterality: Left;  Left Popliteal and Tibial Embolectomy with patch angioplasty  . Dilation and curettage of uterus   . Multiple tooth extractions   . Amputation 03/03/2011    Procedure: AMPUTATION DIGIT;  Surgeon: Newt Minion, MD;  Location: [place];  Service: Orthopedics;  Laterality: Left;  Left Foot Amputation 4th and 5th toes at MTP joint    Family History  Problem Relation Age of Onset  . COPD Mother     alive - 2  . Hypertension Mother   . Diabetes Mother   . Stroke Mother   . Coronary artery disease Mother   . Diabetes Father     alive - 9  . Hypertension Father   . Coronary artery disease Father   . Anesthesia problems Neg Hx     Social History:  reports that she has been passively smoking Cigarettes.  She has a 21 pack-year smoking history. She has never used smokeless tobacco. She reports that she uses illicit drugs (Marijuana) about once per week. She reports that she does not drink alcohol.  Allergies:  Allergies  Allergen Reactions  . Aspirin Other (See Comments)    Cannot take this medication due to kidney  disease  . Ibuprofen Other (See Comments)    Kidney disease    Medications: I have reviewed the patient's current medications.  Results for orders placed during the hospital encounter of 04/05/11 (from the past 48 hour(s))  GLUCOSE, CAPILLARY     Status: Abnormal   Collection Time   04/18/11  7:40 AM      Component Value Range Comment   Glucose-Capillary 138 (*) 70 - 99 (mg/dL)   GLUCOSE, CAPILLARY     Status: Abnormal   Collection Time   04/18/11 11:33 AM      Component Value Range Comment   Glucose-Capillary 173 (*) 70 - 99 (mg/dL)   GLUCOSE, CAPILLARY     Status: Abnormal   Collection Time   04/18/11  4:12 PM      Component Value Range Comment   Glucose-Capillary 223 (*) 70 - 99 (mg/dL)    Comment 1 Notify RN      Comment 2 Documented in Chart     GLUCOSE, CAPILLARY     Status: Abnormal   Collection Time   04/18/11  9:00 PM      Component Value Range Comment   Glucose-Capillary 129 (*) 70 -  99 (mg/dL)    Comment 1 Documented in Chart      Comment 2 Notify RN     BASIC METABOLIC PANEL     Status: Abnormal   Collection Time   04/19/11  4:01 AM      Component Value Range Comment   Sodium 136  135 - 145 (mEq/L)    Potassium 3.9  3.5 - 5.1 (mEq/L)    Chloride 105  96 - 112 (mEq/L)    CO2 25  19 - 32 (mEq/L)    Glucose, Bld 98  70 - 99 (mg/dL)    BUN 7  6 - 23 (mg/dL)    Creatinine, Ser 1.45 (*) 0.50 - 1.10 (mg/dL)    Calcium 7.8 (*) 8.4 - 10.5 (mg/dL)    GFR calc non Af Amer 44 (*) >90 (mL/min)    GFR calc Af Amer 51 (*) >90 (mL/min)   PROTIME-INR     Status: Abnormal   Collection Time   04/19/11  4:01 AM      Component Value Range Comment   Prothrombin Time 21.2 (*) 11.6 - 15.2 (seconds)    INR 1.80 (*) 0.00 - 1.49    CBC     Status: Abnormal   Collection Time   04/19/11  4:01 AM      Component Value Range Comment   WBC 13.2 (*) 4.0 - 10.5 (K/uL)    RBC 2.11 (*) 3.87 - 5.11 (MIL/uL)    Hemoglobin 6.4 (*) 12.0 - 15.0 (g/dL)    HCT 19.4 (*) 36.0 - 46.0 (%)    MCV 91.9   78.0 - 100.0 (fL)    MCH 30.3  26.0 - 34.0 (pg)    MCHC 33.0  30.0 - 36.0 (g/dL)    RDW 14.5  11.5 - 15.5 (%)    Platelets 921 (*) 150 - 400 (K/uL)   DIFFERENTIAL     Status: Abnormal   Collection Time   04/19/11  4:01 AM      Component Value Range Comment   Neutrophils Relative 66  43 - 77 (%)    Neutro Abs 8.7 (*) 1.7 - 7.7 (K/uL)    Lymphocytes Relative 20  12 - 46 (%)    Lymphs Abs 2.6  0.7 - 4.0 (K/uL)    Monocytes Relative 11  3 - 12 (%)    Monocytes Absolute 1.5 (*) 0.1 - 1.0 (K/uL)    Eosinophils Relative 3  0 - 5 (%)    Eosinophils Absolute 0.4  0.0 - 0.7 (K/uL)    Basophils Relative 0  0 - 1 (%)    Basophils Absolute 0.0  0.0 - 0.1 (K/uL)    Smear Review PLATELETS APPEAR INCREASED     GLUCOSE, CAPILLARY     Status: Normal   Collection Time   04/19/11  6:04 AM      Component Value Range Comment   Glucose-Capillary 83  70 - 99 (mg/dL)    Comment 1 Documented in Chart      Comment 2 Notify RN     GLUCOSE, CAPILLARY     Status: Normal   Collection Time   04/19/11  7:24 AM      Component Value Range Comment   Glucose-Capillary 73  70 - 99 (mg/dL)   VANCOMYCIN, TROUGH     Status: Abnormal   Collection Time   04/19/11  7:53 AM      Component Value Range Comment   Vancomycin Tr 22.0 (*) 10.0 -  20.0 (ug/mL)   PREPARE RBC (CROSSMATCH)     Status: Normal   Collection Time   04/19/11 10:40 AM      Component Value Range Comment   Order Confirmation ORDER PROCESSED BY BLOOD BANK     ABO/RH     Status: Normal   Collection Time   04/19/11 10:40 AM      Component Value Range Comment   ABO/RH(D) B POS     TYPE AND SCREEN     Status: Normal (Preliminary result)   Collection Time   04/19/11 11:00 AM      Component Value Range Comment   ABO/RH(D) B POS      Antibody Screen NEG      Sample Expiration 04/22/2011      Unit Number [id number]      Blood Component Type RED CELLS,LR      Unit division 00      Status of Unit ALLOCATED      Transfusion Status OK TO TRANSFUSE      Crossmatch  Result Compatible      Unit Number [id number]      Blood Component Type RED CELLS,LR      Unit division 00      Status of Unit ISSUED      Transfusion Status OK TO TRANSFUSE      Crossmatch Result Compatible     GLUCOSE, CAPILLARY     Status: Abnormal   Collection Time   04/19/11 11:09 AM      Component Value Range Comment   Glucose-Capillary 67 (*) 70 - 99 (mg/dL)   GLUCOSE, CAPILLARY     Status: Abnormal   Collection Time   04/19/11 11:50 AM      Component Value Range Comment   Glucose-Capillary 61 (*) 70 - 99 (mg/dL)   GLUCOSE, CAPILLARY     Status: Abnormal   Collection Time   04/19/11  5:29 PM      Component Value Range Comment   Glucose-Capillary 36 (*) 70 - 99 (mg/dL)    Comment 1 Notify RN     GLUCOSE, CAPILLARY     Status: Abnormal   Collection Time   04/19/11  6:15 PM      Component Value Range Comment   Glucose-Capillary 120 (*) 70 - 99 (mg/dL)   TYPE AND SCREEN     Status: Normal (Preliminary result)   Collection Time   04/19/11  8:10 PM      Component Value Range Comment   ABO/RH(D) B POS      Antibody Screen NEG      Sample Expiration 04/22/2011      Unit Number [id number]      Blood Component Type RED CELLS,LR      Unit division 00      Status of Unit ISSUED      Transfusion Status OK TO TRANSFUSE      Crossmatch Result Compatible     PREPARE RBC (CROSSMATCH)     Status: Normal   Collection Time   04/19/11  8:10 PM      Component Value Range Comment   Order Confirmation ORDER PROCESSED BY BLOOD BANK     GLUCOSE, CAPILLARY     Status: Abnormal   Collection Time   04/19/11  8:14 PM      Component Value Range Comment   Glucose-Capillary 116 (*) 70 - 99 (mg/dL)   GLUCOSE, CAPILLARY     Status: Abnormal   Collection Time   04/19/11  11:46 PM      Component Value Range Comment   Glucose-Capillary 142 (*) 70 - 99 (mg/dL)   GLUCOSE, CAPILLARY     Status: Abnormal   Collection Time   04/20/11  4:00 AM      Component Value Range Comment   Glucose-Capillary 166 (*) 70 - 99  (mg/dL)   PROTIME-INR     Status: Abnormal   Collection Time   04/20/11  5:15 AM      Component Value Range Comment   Prothrombin Time 19.1 (*) 11.6 - 15.2 (seconds)    INR 1.57 (*) 0.00 - 1.49    CBC     Status: Abnormal   Collection Time   04/20/11  5:15 AM      Component Value Range Comment   WBC 14.0 (*) 4.0 - 10.5 (K/uL)    RBC 3.25 (*) 3.87 - 5.11 (MIL/uL)    Hemoglobin 9.7 (*) 12.0 - 15.0 (g/dL)    HCT 29.2 (*) 36.0 - 46.0 (%)    MCV 89.8  78.0 - 100.0 (fL)    MCH 29.8  26.0 - 34.0 (pg)    MCHC 33.2  30.0 - 36.0 (g/dL)    RDW 15.3  11.5 - 15.5 (%)    Platelets 871 (*) 150 - 400 (K/uL)   GLUCOSE, CAPILLARY     Status: Abnormal   Collection Time   04/20/11  6:36 AM      Component Value Range Comment   Glucose-Capillary 202 (*) 70 - 99 (mg/dL)    Comment 1 Notify RN       No results found.  Review of Systems  All other systems reviewed and are negative.   Blood pressure 101/76, pulse 81, temperature 98.4 F (36.9 C), temperature source Oral, resp. rate 11, height 5\' 1"  (1.549 m), weight 67.8 kg (149 lb 7.6 oz), last menstrual period 12/28/2010, SpO2 99.00%. Physical Exam Ischemic gangrene left foot, healed previous surgerues Assessment/Plan: Gangrene left foot Plan: I'll discuss with Dr Kellie Simmering, patient will need BKA left, may need vascular study, I would be available for surgery Thursday morning or Friday afternoon.[name] V 04/20/2011, 7:10 AM

## 2011-04-20 NOTE — Progress Notes (Signed)
  VASCULAR AND VEIN SURGERY PROGRESS NOTE  Progress note      HPI: [name] is a 41 y.o. female Patient is a very complex 41 year old female known to the vascular surgery service. She presented with embolus to her left foot in early January 2013 and underwent an arteriogram showing this and the obstruction her tibial vessels. She was taken to the operating room by Dr. Kellie Simmering on January 11 and underwent a left popliteal and tibial thrombectomy. She continues to have footdrop and non-by [name] the lateral aspect of her foot. She essentially underwent fourth and fifth toe amputation. When last seen in our office by Dr. Kellie Simmering in early February she was noted to have healing of this and did have a popliteal pulse and also posterior tibial pulse. She subsequently has had to admission with what sounds like cerebral embolus. She had Doppler studies which were negative at [facility]. She was readmitted with worsening left foot pain. She has been transferred to [facility] for further treatment.       Significant Diagnostic Studies: CBC    Component Value Date/Time   WBC 14.0* 04/20/2011 0515   RBC 3.25* 04/20/2011 0515   HGB 9.7* 04/20/2011 0515   HCT 29.2* 04/20/2011 0515   PLT 871* 04/20/2011 0515   MCV 89.8 04/20/2011 0515   MCH 29.8 04/20/2011 0515   MCHC 33.2 04/20/2011 0515   RDW 15.3 04/20/2011 0515   LYMPHSABS 2.6 04/19/2011 0401   MONOABS 1.5* 04/19/2011 0401   EOSABS 0.4 04/19/2011 0401   BASOSABS 0.0 04/19/2011 0401    BMET    Component Value Date/Time   NA 136 04/19/2011 0401   K 3.9 04/19/2011 0401   CL 105 04/19/2011 0401   CO2 25 04/19/2011 0401   GLUCOSE 98 04/19/2011 0401   BUN 7 04/19/2011 0401   CREATININE 1.45* 04/19/2011 0401   CALCIUM 7.8* 04/19/2011 0401   GFRNONAA 44* 04/19/2011 0401   GFRAA 51* 04/19/2011 0401    COAG Lab Results  Component Value Date   INR 1.57* 04/20/2011   INR 1.80* 04/19/2011   INR 1.72* 04/18/2011   No results found for this basename: PTT     I/O  last 3 completed shifts: In: 1695 [P.O.:360; I.V.:1335] Out: 59 [Urine:1550]  Physical Examination  Ischemic gangrene left foot   Assessment/Plan  [name] is a 41 y.o. year old female who is going to under go a left BKA by Dr. Sharol Given Thursday or Friday.   [name] [facility] 04/20/2011 7:53 AM

## 2011-04-20 NOTE — Progress Notes (Signed)
INITIAL ADULT NUTRITION ASSESSMENT Date: 04/20/2011   Time: 2:41 PM  Reason for Assessment: poor PO intake  ASSESSMENT: Female 41 y.o.  Dx: Osteomyelitis of foot, left, acute  Hx:  Past Medical History  Diagnosis Date  . Shingles     11/2010  . Cellulitis     of face - 12/2010  . Peripheral vascular disease in diabetes mellitus   . Hyperlipidemia   . Tobacco abuse     0.5 - 1ppd x 21 yrs  . Headache   . Renal disorder     glomerulonephritis  . Blood transfusion   . Anxiety   . Depression   . Peripheral vascular disease   . Diabetes mellitus     IDDM Since Age 71  . Embolism - blood clot   . Renal insufficiency     Related Meds:     . clopidogrel  75 mg Oral Q breakfast  . insulin aspart  0-15 Units Subcutaneous TID WC  . insulin aspart  0-5 Units Subcutaneous QHS  . insulin glargine  10 Units Subcutaneous QHS  . morphine   Intravenous Q4H  . mulitivitamin with minerals  1 tablet Oral Daily  . omega-3 acid ethyl esters  1 g Oral Daily  . pantoprazole  40 mg Oral Q1200  . piperacillin-tazobactam (ZOSYN)  IV  3.375 g Intravenous Q8H  . potassium chloride SA  20 mEq Oral TID  . sertraline  50 mg Oral Daily  . simvastatin  20 mg Oral q1800  . vancomycin  750 mg Intravenous Q24H  . DISCONTD: amLODipine  2.5 mg Oral Daily  . DISCONTD: ciprofloxacin  500 mg Oral BID  . DISCONTD: enoxaparin (LOVENOX) injection  100 mg Subcutaneous Q24H  . DISCONTD: furosemide  20 mg Oral 3 times weekly  . DISCONTD: insulin aspart  0-15 Units Subcutaneous Q6H  . DISCONTD: insulin glargine  10 Units Subcutaneous QHS  . DISCONTD: morphine   Intravenous Q4H  . DISCONTD: morphine   Intravenous Q4H  . DISCONTD: morphine   Intravenous Q4H  . DISCONTD: morphine   Intravenous Q4H  . DISCONTD: oxyCODONE  15 mg Oral Q6H    Ht: 5\' 1"  (154.9 cm)  Wt: 149 lb 7.6 oz (67.8 kg) -- 04/06/11  Ideal Wt: 47.7 kg % Ideal Wt: 142%  Usual Wt: 138 lb -- per office visit record February 2013 % Usual  Wt: 108%  Body mass index is 28.24 kg/(m^2).  Food/Nutrition Related Hx: no triggers per admission nutrition screen  Labs:  CMP     Component Value Date/Time   NA 136 04/19/2011 0401   K 3.9 04/19/2011 0401   CL 105 04/19/2011 0401   CO2 25 04/19/2011 0401   GLUCOSE 98 04/19/2011 0401   BUN 7 04/19/2011 0401   CREATININE 1.45* 04/19/2011 0401   CALCIUM 7.8* 04/19/2011 0401   PROT 5.0* 03/03/2011 0911   ALBUMIN 1.0* 03/03/2011 0911   AST 13 03/03/2011 0911   ALT 10 03/03/2011 0911   ALKPHOS 89 03/03/2011 0911   BILITOT 0.1* 03/03/2011 0911   GFRNONAA 44* 04/19/2011 0401   GFRAA 51* 04/19/2011 0401     Intake/Output Summary (Last 24 hours) at 04/20/11 1443 Last data filed at 04/20/11 1300  Gross per 24 hour  Intake    620 ml  Output    500 ml  Net    120 ml    CBG (last 3)   Basename 04/20/11 1207 04/20/11 0751 04/20/11 0636  GLUCAP 140* 170* 202*  Diet Order: Full Liquid  Supplements/Tube Feeding: N/A  IVF:    dextrose 5 % and 0.45% NaCl Last Rate: 100 mL/hr at 04/20/11 [id number]  DISCONTD: dextrose Last Rate: 75 mL/hr at 04/19/11 0415    Estimated Nutritional Needs:   Kcal: 1700-1900 Protein: 80-90 gm Fluid: 1.7-1.9 L  RD briefly spoke with pt re: nutrition hx -- states her appetite was good PTA, however at this time is "so-so;" no reported weight loss; initially admitted to [facility]; developed gangrene of her left forefoot with osteomyelitis and hence was transferred to [facility] for further workup; for BKA; PO intake is poor at 0-50% per flowsheet records; would benefit from supplemental addition -- amenable to [employer] as she likes fruits & vegetables -- RD to order.  NUTRITION DIAGNOSIS: -Inadequate oral intake (NI-2.1).  Status: Ongoing  RELATED TO: poor appetite & pain  AS EVIDENCE BY: PO intake 0-50%  MONITORING/EVALUATION(Goals): Goal: meet >90% of estimated nutrition needs to promote healing & recovery Monitor: PO intake, weight, labs, I/O's  EDUCATION NEEDS: -No  education needs identified at this time  INTERVENTION:  Add Facilities manager supplement PO TID (250 kcals, 9 gm protein per 8 fl oz carton)  RD to follow for nutrition care plan  Dietitian #: [place] Per approved criteria  -Not Applicable    Lady Deutscher 04/20/2011, 2:41 PM

## 2011-04-20 NOTE — Discharge Summary (Signed)
Patient Name: [name] Date of Encounter: 04/20/2011  Principal Problem:  *Osteomyelitis of foot, left, acute Active Problems:  Diabetes mellitus  Peripheral vascular disease in diabetes mellitus  Hyperlipidemia  Thromboembolism of lower extremity artery  Cellulitis of left foot  Cerebral embolus with cerebral infarction  Delirium    SUBJECTIVE:  Pt feels tired, left foot VERY sore and tender. No chest pain or SOB.   OBJECTIVE  Filed Vitals:   04/20/11 0411 04/20/11 0454 04/20/11 0800 04/20/11 0804  BP: 101/76     Pulse: 81     Temp: 98.4 F (36.9 C)  99.1 F (37.3 C)   TempSrc: Oral     Resp: 18 11  12   Height:      Weight:      SpO2: 100% 99%  97%    Intake/Output Summary (Last 24 hours) at 04/20/11 0847 Last data filed at 04/20/11 0100  Gross per 24 hour  Intake    620 ml  Output    500 ml  Net    120 ml   Weight change:  Wt Readings from Last 3 Encounters:  04/06/11 149 lb 7.6 oz (67.8 kg)  04/06/11 149 lb 7.6 oz (67.8 kg)  04/04/11 140 lb (63.504 kg)     PHYSICAL EXAM  General: Well developed, slender female, in pain secondary to left foot. Head: Normocephalic, atraumatic.  Neck: Supple without bruits, JVD not elevated Lungs:  Resp regular and unlabored, few rales bases. Heart: RRR, S1, S2, no S3, S4, 2/6 murmur. Abdomen: Soft, non-tender, non-distended, BS + x 4.  Extremities: No edema, left foot with gangrenous changes, erythema and some edema.  Neuro: Alert and oriented X 3. Moves all extremities spontaneously.   LABS:  CBC: Basename 04/20/11 0515 04/19/11 0401  WBC 14.0* 13.2*  NEUTROABS -- 8.7*  HGB 9.7* 6.4*  HCT 29.2* 19.4*  MCV 89.8 91.9  PLT 871* 921*   INR: Basename 04/20/11 0515  INR [id number]*   Basic Metabolic Panel: Basename [id number] 0401  NA 136  K 3.9  CL 105  CO2 25  GLUCOSE 98  BUN 7  CREATININE 1.45*  CALCIUM 7.8*  MG --  PHOS --    TELE:  SR    ECHO: TEE 3/28   Study Conclusions - Left  ventricle: The cavity size was normal. There was mild concentric hypertrophy. Systolic function was normal. The estimated ejection fraction was in the range of 55% to 60%. - Aortic valve: No evidence of vegetation. - Mitral valve: No evidence of vegetation. - Left atrium: No evidence of thrombus in the appendage. - Right atrium: No evidence of thrombus in the atrial cavity or appendage. - Atrial septum: No defect or patent foramen ovale was identified. Agitated saline contrast study showed no shunt. 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 - Tricuspid valve: No evidence of vegetation. - Pulmonic valve: No evidence of vegetation. Impressions: - Normal study. Compared to the prior study performed 01/28/11, there has been no significant interval change.  Radiology/Studies: Dg Hip Complete Left 04/15/2011  *RADIOLOGY REPORT*  Clinical Data: Left hip pain following a fall.  LEFT HIP - COMPLETE 2+ VIEW  Comparison: None.  Findings: Normal appearing left hip and pelvic bones.  No fracture or dislocation.  IMPRESSION: Normal examination.  Original Report Authenticated By: Gerald Stabs, M.D.    Mr Brain Wo Contrast 04/12/2011  *RADIOLOGY REPORT*  Clinical Data:  Confusion,  diabetes, abnormal CT head.  MRI HEAD WITHOUT CONTRAST MRA HEAD WITHOUT CONTRAST  Technique:  Multiplanar, multiecho pulse sequences of the brain and surrounding structures were obtained without intravenous contrast. Angiographic images of the head were obtained using MRA technique without contrast.  Comparison:  CT head 04/12/2011  MRI HEAD  Findings:  Image quality degraded by motion.  There is motion on diffusion weighted imaging which was repeated.  Repeat imaging also has motion.  Despite motion, there is evidence of acute infarct in various vascular territories.  Small area acute infarct in the right inferior cerebellum.  Small area of acute infarct  in the head of the caudate on the right and in the right putamen.  Small acute infarcts  in the right posterior temporal lobe and in the right parietal white matter.  Acute infarct also present in the right medial frontal lobe in the anterior cerebral artery territory.  No acute infarct on the left.  Ventricle size is normal.  Negative for hemorrhage or mass lesion. No significant chronic ischemic changes are present.  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  These involve different vascular territories and are suggestive of arterial emboli.   MRA HEAD  Findings: Image quality degraded by motion.  The patient moved midway through the  the exam causing misregistration artifact.  Both vertebral arteries are patent to the basilar.  Left vertebral artery is patent.  PICA is patent bilaterally.  Basilar artery is patent.  Superior cerebellar and posterior cerebral arteries are patent bilaterally.  Limited visualization of the distal posterior cerebral arteries due to artifact from motion.  Internal carotid artery is patent bilaterally.  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 Anterior and middle cerebral arteries are patent but poorly visualized due to motion.  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  IMPRESSION: Limited study due to motion.  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 Hypoplastic left A1 segment.  Intracranial circulation not well evaluated on this study.  Original Report Authenticated By: Truett Perna, M.D.    US Carotid Duplex Bilateral 04/14/2011  *RADIOLOGY REPORT*  Clinical Data: Acute CVA.  BILATERAL CAROTID DUPLEX ULTRASOUND  Technique: Pearline Cables scale imaging, color Doppler and duplex ultrasound was performed of bilateral carotid and vertebral arteries in the neck.   Comparison:  MRI 04/12/2011  Criteria:  Quantification of carotid stenosis is based on velocity parameters that correlate the residual internal carotid diameter with NASCET-based stenosis levels, using the diameter of the distal internal carotid lumen as the denominator for stenosis measurement.  The following velocity measurements were obtained:                   PEAK SYSTOLIC/END DIASTOLIC RIGHT ICA:                        115cm/sec CCA:                        [id number] SYSTOLIC ICA/CCA RATIO:     1.1 DIASTOLIC ICA/CCA RATIO:    1.4 ECA:                        156cm/sec  LEFT ICA:  122cm/sec CCA:                        [id number] SYSTOLIC ICA/CCA RATIO:     0.8 DIASTOLIC ICA/CCA RATIO:    1.5 ECA:                        168cm/sec  Findings:  RIGHT CAROTID ARTERY: Intimal thickening in the right common carotid artery.  A small amount of plaque in the distal carotid bulb.  Minimal plaque in the proximal right internal carotid artery.  Normal velocities and waveforms in the right internal carotid artery.  RIGHT VERTEBRAL ARTERY:  Antegrade flow and normal waveform in the right vertebral artery.  LEFT CAROTID ARTERY: There is some intimal thickening in the left common carotid artery.  Left carotid arteries are patent without significant plaque or stenosis.  LEFT VERTEBRAL ARTERY:  Antegrade flow and normal waveform in the left vertebral artery.  IMPRESSION: Minimal atherosclerotic disease in the carotid arteries.  No significant carotid artery stenosis.  Estimated degree of stenosis in the internal carotid arteries is less than 50% bilaterally.  Original Report Authenticated By: Markus Daft, M.D.   Nm Myocar Single W/spect W/Palmer Fahrner Motion And Ef 04/09/2011  *RADIOLOGY REPORT*  Clinical Data:  Abnormal EKG, chest discomfort  MYOCARDIAL IMAGING WITH SPECT (REST AND PHARMACOLOGIC-STRESS) GATED LEFT VENTRICULAR Marene Gilliam MOTION STUDY LEFT VENTRICULAR EJECTION FRACTION  Technique:  Standard myocardial SPECT  imaging was performed after resting intravenous injection of 12/10 mCi Tc-29m Myoview. Subsequently, intravenous infusion of Lexiscan was performed under the supervision of the Cardiology staff.  At peak effect of the drug, 30 mCi Tc-47m Myoview was injected intravenously and standard myocardial SPECT imaging was performed.  Quantitative gated imaging was also performed to evaluate left ventricular Asuzena Weis motion and estimate left ventricular ejection fraction.  Comparison:  03/12/2011  Findings:  Spect:  Decreased activity of the anterior apical Imanol Bihl is more pronounced on the rest views than the stress more compatible with breast attenuation artifact.  No definite inducible or reversible ischemia with pharmacologic stress.  Tanesia Butner motion:  Normal Kemuel Buchmann motion and thickening  Ejection fraction:  Calculated Q G S ejection fraction is 78%.  IMPRESSION: Breast attenuation of the anterior apical Elizeth Weinrich.  No definite inducible or reversible ischemia with pharmacologic stress  78% ejection fraction  Original Report Authenticated By: Jerilynn Mages. Daryll Brod, M.D.    Current Medications:  . ciprofloxacin  500 mg Oral BID  . clopidogrel  75 mg Oral Q breakfast  . enoxaparin (LOVENOX) injection  100 mg Subcutaneous Q24H  . insulin aspart  0-15 Units Subcutaneous Q6H  . morphine   Intravenous Q4H  . mulitivitamin with minerals  1 tablet Oral Daily  . omega-3 acid ethyl esters  1 g Oral Daily  . oxyCODONE  15 mg Oral Q6H  . pantoprazole  40 mg Oral Q1200  . piperacillin-tazobactam (ZOSYN)  IV  3.375 g Intravenous Q8H  . potassium chloride SA  20 mEq Oral TID  . sertraline  50 mg Oral Daily  . simvastatin  20 mg Oral q1800  . sodium chloride      . vancomycin  750 mg Intravenous Q24H    ASSESSMENT AND PLAN: Principal Problem:  *Osteomyelitis of foot, left, acute Active Problems:  Diabetes mellitus  Peripheral vascular disease in diabetes mellitus  Hyperlipidemia  Thromboembolism of lower extremity artery  Cellulitis  of left foot  Cerebral embolus with cerebral infarction  Delirium  Dr Sharol Given and Dr Early seeing with IM managing. Pt is for left BKA per notes. Cardiology did TEE for CVA and MV which was without ischemia. MD advise on any further cardiac eval, otherwise, call with questions.   SignedRosaria Ferries , PA-C 8:47 AM 04/20/2011 Agree with above summary. Cleared for surgery. No additional recommendations.  Jenell Milliner, MD 04/20/2011 9:25 AM

## 2011-04-20 NOTE — Progress Notes (Signed)
Inpatient Diabetes Program Recommendations  AACE/ADA: New Consensus Statement on Inpatient Glycemic Control (2009)  Target Ranges:  Prepandial:   less than 140 mg/dL      Peak postprandial:   less than 180 mg/dL (1-2 hours)      Critically ill patients:  140 - 180 mg/dL   Reason for Visit: Hyperglycemia / Hypoglycemia   Results for [name], [name] (MRN [id number]) as of 04/20/2011 09:48  Ref. Range 04/19/2011 07:24 04/19/2011 11:09 04/19/2011 11:50 04/19/2011 17:29 04/19/2011 18:15 04/19/2011 20:14 04/19/2011 23:46 04/20/2011 04:00 04/20/2011 06:36 04/20/2011 07:51  Glucose-Capillary Latest Range: 70-99 mg/dL 73 67 (L) 61 (L) 36 (LL) 120 (H) 116 (H) 142 (H) 166 (H) 202 (H) 170 (H)     Inpatient Diabetes Program Recommendations Insulin - Basal: Pt needs basal insulin - has Type I DM - Add Lantus 10 units QHS.  Titrate to home dose of 15 units QHS when diet is advanced. Insulin - Meal Coverage: When diet advanced, add Novolog 4 units tidwc for meal coverage insulin Diet: When advanced, CHO-mod medium  Note: HgbA1C - 8.0%.  Needs diabetes medication adjustment prior to discharge.  Will follow.

## 2011-04-21 ENCOUNTER — Inpatient Hospital Stay (HOSPITAL_COMMUNITY): Payer: Medicaid Other

## 2011-04-21 LAB — GLUCOSE, CAPILLARY
Glucose-Capillary: 133 mg/dL — ABNORMAL HIGH (ref 70–99)
Glucose-Capillary: 169 mg/dL — ABNORMAL HIGH (ref 70–99)
Glucose-Capillary: 413 mg/dL — ABNORMAL HIGH (ref 70–99)
Glucose-Capillary: 47 mg/dL — ABNORMAL LOW (ref 70–99)
Glucose-Capillary: 51 mg/dL — ABNORMAL LOW (ref 70–99)
Glucose-Capillary: 52 mg/dL — ABNORMAL LOW (ref 70–99)
Glucose-Capillary: 53 mg/dL — ABNORMAL LOW (ref 70–99)
Glucose-Capillary: 63 mg/dL — ABNORMAL LOW (ref 70–99)
Glucose-Capillary: 66 mg/dL — ABNORMAL LOW (ref 70–99)
Glucose-Capillary: 91 mg/dL (ref 70–99)

## 2011-04-21 LAB — BASIC METABOLIC PANEL
BUN: 9 mg/dL (ref 6–23)
CO2: 19 mEq/L (ref 19–32)
Calcium: 6.9 mg/dL — ABNORMAL LOW (ref 8.4–10.5)
Chloride: 110 mEq/L (ref 96–112)
Creatinine, Ser: 1.43 mg/dL — ABNORMAL HIGH (ref 0.50–1.10)
GFR calc Af Amer: 52 mL/min — ABNORMAL LOW (ref >90)
GFR calc non Af Amer: 45 mL/min — ABNORMAL LOW (ref >90)
Glucose, Bld: 414 mg/dL — ABNORMAL HIGH (ref 70–99)
Potassium: 4.6 mEq/L (ref 3.5–5.1)
Sodium: 138 mEq/L (ref 135–145)

## 2011-04-21 LAB — PATHOLOGIST SMEAR REVIEW

## 2011-04-21 LAB — PROTIME-INR
INR: 1.62 — ABNORMAL HIGH (ref 0.00–1.49)
Prothrombin Time: 19.5 seconds — ABNORMAL HIGH (ref 11.6–15.2)

## 2011-04-21 MED ORDER — DEXTROSE 50 % IV SOLN
25.0000 mL | Freq: Once | INTRAVENOUS | Status: AC
  Administered 2011-04-21: 25 mL via INTRAVENOUS

## 2011-04-21 MED ORDER — MORPHINE SULFATE (PF) 1 MG/ML IV SOLN
INTRAVENOUS | Status: AC
  Filled 2011-04-21: qty 25

## 2011-04-21 NOTE — Progress Notes (Signed)
While pt is on [place] diet and receiving supplemental shakes, would greatly benefit from meal coverage insulin to cover CHOs.  Recommend Novolog 4 units tidwc.  CBG - 413 this am.  Thank you.

## 2011-04-21 NOTE — Progress Notes (Signed)
Pt cbg at 2230 is 66. MD paged. Orders received and will carry out.

## 2011-04-21 NOTE — Progress Notes (Signed)
Unable to bring blood sugar up with current orders. MD paged.

## 2011-04-21 NOTE — Progress Notes (Signed)
PROGRESS NOTE  CORTNY SWANIGAN [id number] DOB: 1970-06-04 DOA: 04/05/2011 PCP: Alonza Bogus, MD, MD  Interim History: 41 year old woman with severe peripheral vascular disease developed gangrene of her left foot with osteomyelitis.  Subjective: Complains of pain with full.  Objective: Filed Vitals:   04/21/11 0600 04/21/11 0816 04/21/11 1228 04/21/11 1400  BP: 91/48   91/50  Pulse: 81   69  Temp: 99.1 F (37.3 C)   98.7 F (37.1 C)  TempSrc: Oral   Oral  Resp: 16 13 16 18   Height:      Weight:      SpO2: 98% 98% 100% 98%    Intake/Output Summary (Last 24 hours) at 04/21/11 1455 Last data filed at 04/20/11 1901  Gross per 24 hour  Intake   1262 ml  Output      3 ml  Net   1259 ml    Exam:   General:  Appears calm and comfortable.  Cardiovascular: Regular rate and rhythm. No murmur, rub, gallop.   Respiratory: Clear to auscultation bilaterally. No wheezes, rales, rhonchi. Normal respiratory effort.  Musculoskeletal: Left foot gangrene noted.  Data Reviewed: Basic Metabolic Panel:  Lab [id number] 0615 04/20/11 2220 04/19/11 0401 04/16/11 0547  NA 138 138 136 140  K 4.6 4.5 -- --  CL 110 108 105 111  CO2 19 20 25 22   GLUCOSE 414* 283* 98 38*  BUN 9 7 7 8   CREATININE 1.43* 1.34* 1.45* 1.27*  CALCIUM 6.9* 7.0* 7.8* 8.3*  MG -- -- -- --  PHOS -- -- -- --   Liver Function Tests:  Lab 04/20/11 2220  AST 11  ALT 8  ALKPHOS 93  BILITOT <0.1*  PROT 4.6*  ALBUMIN 0.6*    Lab 04/15/11 1102  AMMONIA 23   CBC:  Lab 04/20/11 2220 04/20/11 0515 04/19/11 0401  WBC 16.8* 14.0* 13.2*  NEUTROABS -- -- 8.7*  HGB 9.0* 9.7* 6.4*  HCT 26.8* 29.2* 19.4*  MCV 88.7 89.8 91.9  PLT 734* 871* 921*   CBG:  Lab 04/21/11 1128 04/21/11 0721 04/20/11 2052 04/20/11 1632 04/20/11 1207  GLUCAP 169* 413* 190* 171* 140*    Recent Results (from the past 240 hour(s))  CULTURE, BLOOD (ROUTINE X 2)     Status: Normal   Collection Time   04/13/11  6:34 PM   Component Value Range Status Comment   Specimen Description BLOOD LEFT WRIST   Final    Special Requests     Final    Value: BOTTLES DRAWN AEROBIC AND ANAEROBIC AEB=9CC ANA=4CC   Culture NO GROWTH 7 DAYS   Final    Report Status 04/20/2011 FINAL   Final      Studies:  Scheduled Meds:   . clopidogrel  75 mg Oral Q breakfast  . enoxaparin (LOVENOX) injection  40 mg Subcutaneous Q24H  . feeding supplement  1 Container Oral TID WC  . insulin aspart  0-15 Units Subcutaneous TID WC  . insulin aspart  0-5 Units Subcutaneous QHS  . insulin glargine  10 Units Subcutaneous QHS  . morphine   Intravenous Q4H  . morphine      . mulitivitamin with minerals  1 tablet Oral Daily  . omega-3 acid ethyl esters  1 g Oral Daily  . pantoprazole  40 mg Oral Q1200  . piperacillin-tazobactam (ZOSYN)  IV  3.375 g Intravenous Q8H  . potassium chloride SA  20 mEq Oral TID  . sertraline  50 mg Oral Daily  .  simvastatin  20 mg Oral q1800  . sodium chloride  1,000 mL Intravenous Once  . vancomycin  750 mg Intravenous Q24H   Continuous Infusions:   . sodium chloride 125 mL/hr at 04/20/11 1803  . sodium chloride 999 mL/hr at 04/20/11 2123  . DISCONTD: dextrose 5 % and 0.45% NaCl 100 mL/hr at 04/20/11 0926     Assessment/Plan: 1. Osteomyelitis left foot/ischemia: Vancomycin, Zosyn. Surgery per orthopedics. Cleared for surgery by cardiology. Continue pain control. 2. Diabetes mellitus uncontrolled hemoglobin A1c 7.1: Lantus. Sliding-scale insulin. 3. Acute renal failure: IV fluids. Repeat basic metabolic panel morning. 4. Anemia: Stable. Continue to follow. 5. Recent stroke: Coumadin on hold for surgery.  Code Status: Full code Family Communication: None bedside Disposition Plan: Pending further treatment.   Murray Hodgkins, MD  [facility] Pager [phone] 04/21/2011, 2:55 PM    LOS: 16 days

## 2011-04-21 NOTE — Progress Notes (Signed)
Syringe changed verified by 2 rn Nona Dell and Tama Headings

## 2011-04-21 NOTE — Progress Notes (Signed)
Pt PCA cleared. 12.94mg  delivered. Will continue to monitor.

## 2011-04-21 NOTE — Progress Notes (Signed)
CBG: 53   Treatment: 15 GM carbohydrate snack  Symptoms: Shaky  Follow-up CBG: Time: 2116 CBG Result: 47  Possible Reasons for Event: Inadequate meal intake  Comments/MD notified:rechecked after giving glucose gel at 2137 cbg 51. Baltazar Najjar paged.     Charna Archer

## 2011-04-21 NOTE — Progress Notes (Signed)
MD returned paged. Advised to recheck blood sugar at 10pm if <70 repeat gel and recheck in 30 mins.  Pt has no symptoms at this time. Will continue to monitor.

## 2011-04-21 NOTE — Progress Notes (Signed)
Patient ID: [name], female   DOB: [birth year], 41 y.o.   MRN: [id number] Plan for left trans tibial amputation Thursday 7:30 AM

## 2011-04-21 NOTE — Progress Notes (Signed)
   CARE MANAGEMENT NOTE 04/21/2011  Patient:  [name], [name]   Account Number:  [id number]  Date Initiated:  04/07/2011  Documentation initiated by:  Theophilus Kinds  Subjective/Objective Assessment:   Pt admitted from home with recurrent cellulitis. 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 Possible osteomylitis. Pt lives with mother and fiance. Pt is able to afford meds. Active with [facility].     Action/Plan:   CM spoke with pt about possible need for IV AB at discharge. Pain control is also an issue at this point.   Anticipated DC Date:  04/14/2011   Anticipated DC Plan:  [place]  In-house referral  Clinical Social Worker      DC Planning Services  CM consult      Choice offered to / List presented to:             [place].   Status of service:  In process, will continue to follow Medicare Important Message given?   (If response is "NO", the following Medicare IM given date fields will be blank) Date Medicare IM given:   Date Additional Medicare IM given:    Discharge Disposition:    Per UR Regulation:  Reviewed for med. necessity/level of care/duration of stay  If discussed at Long Length of Stay Meetings, dates discussed:   04/15/2011    Comments:  04/21/11 [name], RN, BSN [address] [place] [address] BY AND WANTS TO MAKE SURE AFTER BKA TO ORDER PT/OT EVAL AND GET A CONSULT IF IT IS RECOMMENDED.  WILL F/U.  PT IS AT HOME WITH FAMILY.  04/20/2011  [place], [place] Met with patient for CM introduction, will continue to follow for discharge planning needs.  04/20/11 [name], BSN [id number] [place], AT [facility]  04/16/11 [place], RN BSN CM CM spoke with pts daughter, Vikki Ports, who states that pt is now for SNF. At this point, the daughter is unable to take care of pt and pts mother who has dementia. However, pt daughter states that  she only wants this short term until her mother can get IV AB and PT. MD and CSW is aware. CSW will complete paperwork and look for SNF bed.  04/09/11 [place], RN BSN CM Discussed the need for short term placement for IV AB therapy as recommended by Dr. Luan Pulling. Pt is agreeable. CSW made aware.  04/07/11 [place], RN BSN CM Pt admitted from home with cellulitis and possible osteomylitis. Pt already active with [facility]. Will resume services at discharge

## 2011-04-22 ENCOUNTER — Inpatient Hospital Stay (HOSPITAL_COMMUNITY): Payer: Medicaid Other | Admitting: Anesthesiology

## 2011-04-22 ENCOUNTER — Encounter (HOSPITAL_COMMUNITY)
Admission: EM | Disposition: A | Discharge status: Skilled Nursing Facility | Payer: Self-pay | Source: Home / Self Care | Attending: Pulmonary Disease

## 2011-04-22 ENCOUNTER — Encounter (HOSPITAL_COMMUNITY): Payer: Self-pay | Admitting: Anesthesiology

## 2011-04-22 HISTORY — PX: AMPUTATION: SHX166

## 2011-04-22 LAB — GLUCOSE, CAPILLARY
Glucose-Capillary: 138 mg/dL — ABNORMAL HIGH (ref 70–99)
Glucose-Capillary: 127 mg/dL — ABNORMAL HIGH (ref 70–99)
Glucose-Capillary: 171 mg/dL — ABNORMAL HIGH (ref 70–99)
Glucose-Capillary: 81 mg/dL (ref 70–99)
Glucose-Capillary: 158 mg/dL — ABNORMAL HIGH (ref 70–99)
Glucose-Capillary: 176 mg/dL — ABNORMAL HIGH (ref 70–99)

## 2011-04-22 LAB — TYPE AND SCREEN
ABO/RH(D): B POS
Antibody Screen: NEGATIVE
Unit division: 0
Unit division: 0

## 2011-04-22 LAB — PROTIME-INR
INR: 1.52 — ABNORMAL HIGH (ref 0.00–1.49)
Prothrombin Time: 18.6 seconds — ABNORMAL HIGH (ref 11.6–15.2)

## 2011-04-22 LAB — SURGICAL PCR SCREEN
MRSA, PCR: NEGATIVE
Staphylococcus aureus: NEGATIVE

## 2011-04-22 SURGERY — AMPUTATION BELOW KNEE
Anesthesia: General | Site: Leg Lower | Laterality: Left | Wound class: Dirty or Infected

## 2011-04-22 MED ORDER — HYDROMORPHONE HCL PF 1 MG/ML IJ SOLN
0.2500 mg | INTRAMUSCULAR | Status: DC | PRN
  Administered 2011-04-22 (×4): 0.5 mg via INTRAVENOUS

## 2011-04-22 MED ORDER — ROCURONIUM BROMIDE 100 MG/10ML IV SOLN
INTRAVENOUS | Status: DC | PRN
  Administered 2011-04-22: 25 mg via INTRAVENOUS

## 2011-04-22 MED ORDER — HYDROMORPHONE HCL PF 1 MG/ML IJ SOLN
INTRAMUSCULAR | Status: AC
  Filled 2011-04-22: qty 1

## 2011-04-22 MED ORDER — PROMETHAZINE HCL 25 MG/ML IJ SOLN: 6.2500 mg | INTRAMUSCULAR | Status: DC | PRN

## 2011-04-22 MED ORDER — 0.9 % SODIUM CHLORIDE (POUR BTL) OPTIME
TOPICAL | Status: DC | PRN
  Administered 2011-04-22: 1000 mL

## 2011-04-22 MED ORDER — FENTANYL CITRATE 0.05 MG/ML IJ SOLN
25.0000 ug | INTRAMUSCULAR | Status: DC | PRN
  Administered 2011-04-22 (×2): 50 ug via INTRAVENOUS

## 2011-04-22 MED ORDER — ONDANSETRON HCL 4 MG/2ML IJ SOLN
4.0000 mg | Freq: Four times a day (QID) | INTRAMUSCULAR | Status: DC | PRN
  Administered 2011-04-27: 4 mg via INTRAVENOUS
  Filled 2011-04-22: qty 2

## 2011-04-22 MED ORDER — SODIUM CHLORIDE 0.9 % IV SOLN
INTRAVENOUS | Status: DC
  Administered 2011-04-25 (×2): via INTRAVENOUS

## 2011-04-22 MED ORDER — ALTEPLASE 2 MG IJ SOLR
2.0000 mg | Freq: Once | INTRAMUSCULAR | Status: AC
  Administered 2011-04-22: 2 mg
  Filled 2011-04-22: qty 2

## 2011-04-22 MED ORDER — METOCLOPRAMIDE HCL 5 MG/ML IJ SOLN
5.0000 mg | Freq: Three times a day (TID) | INTRAMUSCULAR | Status: DC | PRN
  Filled 2011-04-22: qty 2

## 2011-04-22 MED ORDER — MORPHINE SULFATE (PF) 1 MG/ML IV SOLN
INTRAVENOUS | Status: AC
  Filled 2011-04-22: qty 25

## 2011-04-22 MED ORDER — PROPOFOL 10 MG/ML IV EMUL
INTRAVENOUS | Status: DC | PRN
  Administered 2011-04-22: 120 mg via INTRAVENOUS

## 2011-04-22 MED ORDER — NEOSTIGMINE METHYLSULFATE 1 MG/ML IJ SOLN
INTRAMUSCULAR | Status: DC | PRN
  Administered 2011-04-22: 3 mg via INTRAVENOUS

## 2011-04-22 MED ORDER — METOCLOPRAMIDE HCL 5 MG PO TABS
5.0000 mg | ORAL_TABLET | Freq: Three times a day (TID) | ORAL | Status: DC | PRN
  Filled 2011-04-22: qty 2

## 2011-04-22 MED ORDER — HYDROMORPHONE HCL PF 1 MG/ML IJ SOLN
0.5000 mg | INTRAMUSCULAR | Status: DC | PRN
Start: 2011-04-22 — End: 2011-04-28
  Administered 2011-04-27 – 2011-04-28 (×6): 1 mg via INTRAVENOUS
  Filled 2011-04-22 (×6): qty 1

## 2011-04-22 MED ORDER — FENTANYL CITRATE 0.05 MG/ML IJ SOLN
INTRAMUSCULAR | Status: DC | PRN
  Administered 2011-04-22 (×3): 50 ug via INTRAVENOUS
  Administered 2011-04-22: 100 ug via INTRAVENOUS

## 2011-04-22 MED ORDER — ONDANSETRON HCL 4 MG/2ML IJ SOLN
INTRAMUSCULAR | Status: DC | PRN
  Administered 2011-04-22: 4 mg via INTRAVENOUS

## 2011-04-22 MED ORDER — OXYCODONE-ACETAMINOPHEN 5-325 MG PO TABS
1.0000 | ORAL_TABLET | ORAL | Status: DC | PRN
  Administered 2011-04-26 – 2011-04-28 (×6): 2 via ORAL
  Filled 2011-04-22 (×7): qty 2

## 2011-04-22 MED ORDER — ONDANSETRON HCL 4 MG PO TABS: 4.0000 mg | ORAL_TABLET | Freq: Four times a day (QID) | ORAL | Status: DC | PRN

## 2011-04-22 MED ORDER — MEPERIDINE HCL 25 MG/ML IJ SOLN: 6.2500 mg | INTRAMUSCULAR | Status: DC | PRN

## 2011-04-22 MED ORDER — FENTANYL CITRATE 0.05 MG/ML IJ SOLN
INTRAMUSCULAR | Status: AC
  Filled 2011-04-22: qty 2

## 2011-04-22 MED ORDER — GLYCOPYRROLATE 0.2 MG/ML IJ SOLN
INTRAMUSCULAR | Status: DC | PRN
  Administered 2011-04-22: .5 mg via INTRAVENOUS

## 2011-04-22 MED ORDER — HYDROCODONE-ACETAMINOPHEN 5-325 MG PO TABS: 1.0000 | ORAL_TABLET | ORAL | Status: DC | PRN

## 2011-04-22 SURGICAL SUPPLY — 49 items
BANDAGE ESMARK 6X9 LF (GAUZE/BANDAGES/DRESSINGS) ×1 IMPLANT
BANDAGE GAUZE ELAST BULKY 4 IN (GAUZE/BANDAGES/DRESSINGS) ×3 IMPLANT
BLADE SAW RECIP 87.9 MT (BLADE) ×2 IMPLANT
BLADE SURG 21 STRL SS (BLADE) ×2 IMPLANT
BNDG CMPR 9X6 STRL LF SNTH (GAUZE/BANDAGES/DRESSINGS) ×1
BNDG COHESIVE 6X5 TAN STRL LF (GAUZE/BANDAGES/DRESSINGS) ×4 IMPLANT
BNDG ESMARK 6X9 LF (GAUZE/BANDAGES/DRESSINGS) ×2
CLOTH BEACON ORANGE TIMEOUT ST (SAFETY) ×2 IMPLANT
COVER SURGICAL LIGHT HANDLE (MISCELLANEOUS) ×2 IMPLANT
CUFF TOURNIQUET SINGLE 34IN LL (TOURNIQUET CUFF) IMPLANT
CUFF TOURNIQUET SINGLE 44IN (TOURNIQUET CUFF) IMPLANT
DRAIN PENROSE 1/2X12 LTX STRL (WOUND CARE) IMPLANT
DRAPE EXTREMITY T 121X128X90 (DRAPE) ×2 IMPLANT
DRAPE PROXIMA HALF (DRAPES) ×2 IMPLANT
DRAPE U-SHAPE 47X51 STRL (DRAPES) ×3 IMPLANT
DRSG ADAPTIC 3X8 NADH LF (GAUZE/BANDAGES/DRESSINGS) ×2 IMPLANT
DRSG PAD ABDOMINAL 8X10 ST (GAUZE/BANDAGES/DRESSINGS) ×3 IMPLANT
DURAPREP 26ML APPLICATOR (WOUND CARE) ×2 IMPLANT
ELECT REM PT RETURN 9FT ADLT (ELECTROSURGICAL) ×2
ELECTRODE REM PT RTRN 9FT ADLT (ELECTROSURGICAL) ×1 IMPLANT
EVACUATOR 1/8 PVC DRAIN (DRAIN) IMPLANT
GLOVE BIO SURGEON STRL SZ7.5 (GLOVE) ×1 IMPLANT
GLOVE BIOGEL PI IND STRL 7.0 (GLOVE) IMPLANT
GLOVE BIOGEL PI IND STRL 9 (GLOVE) ×1 IMPLANT
GLOVE BIOGEL PI INDICATOR 7.0 (GLOVE) ×1
GLOVE BIOGEL PI INDICATOR 9 (GLOVE) ×1
GLOVE SURG ORTHO 9.0 STRL STRW (GLOVE) ×2 IMPLANT
GLOVE SURG SS PI 6.5 STRL IVOR (GLOVE) ×1 IMPLANT
GOWN PREVENTION PLUS XLARGE (GOWN DISPOSABLE) ×2 IMPLANT
GOWN SRG XL XLNG 56XLVL 4 (GOWN DISPOSABLE) ×1 IMPLANT
GOWN STRL NON-REIN XL XLG LVL4 (GOWN DISPOSABLE)
KIT BASIN OR (CUSTOM PROCEDURE TRAY) ×2 IMPLANT
KIT ROOM TURNOVER OR (KITS) ×2 IMPLANT
MANIFOLD NEPTUNE II (INSTRUMENTS) ×2 IMPLANT
NS IRRIG 1000ML POUR BTL (IV SOLUTION) ×2 IMPLANT
PACK GENERAL/GYN (CUSTOM PROCEDURE TRAY) ×2 IMPLANT
PAD ARMBOARD 7.5X6 YLW CONV (MISCELLANEOUS) ×4 IMPLANT
SPONGE GAUZE 4X4 12PLY (GAUZE/BANDAGES/DRESSINGS) ×2 IMPLANT
SPONGE LAP 18X18 X RAY DECT (DISPOSABLE) IMPLANT
STAPLER VISISTAT 35W (STAPLE) ×1 IMPLANT
STOCKINETTE IMPERVIOUS LG (DRAPES) ×2 IMPLANT
SUT PDS AB 1 CT  36 (SUTURE)
SUT PDS AB 1 CT 36 (SUTURE) IMPLANT
SUT SILK 2 0 (SUTURE) ×2
SUT SILK 2-0 18XBRD TIE 12 (SUTURE) ×1 IMPLANT
TOWEL OR 17X24 6PK STRL BLUE (TOWEL DISPOSABLE) ×2 IMPLANT
TOWEL OR 17X26 10 PK STRL BLUE (TOWEL DISPOSABLE) ×2 IMPLANT
TUBE ANAEROBIC SPECIMEN COL (MISCELLANEOUS) IMPLANT
WATER STERILE IRR 1000ML POUR (IV SOLUTION) ×1 IMPLANT

## 2011-04-22 NOTE — Progress Notes (Signed)
ANTIBIOTIC CONSULT NOTE - FOLLOW UP  Pharmacy Consult for Vancomycin and Zosyn Indication: foot gangrene/possible osteomyelitis  Allergies  Allergen Reactions  . Aspirin Other (See Comments)    Cannot take this medication due to kidney disease  . Ibuprofen Other (See Comments)    Kidney disease    Patient Measurements: Height: 5\' 1"  (154.9 cm) Weight: 150 lb 9.6 oz (68.312 kg) IBW/kg (Calculated) : 47.8   Vital Signs: Temp: 98.8 F (37.1 C) (04/04 0912) Temp src: Oral (04/04 0500) BP: 93/59 mmHg (04/04 0935) Pulse Rate: 104  (04/04 0935) Intake/Output from previous day:   Intake/Output from this shift: Total I/O In: 250 [I.V.:250] Out: 100 [Blood:100]  Labs:  [facility] 04/21/11 0615 04/20/11 2220 04/20/11 0515  WBC -- 16.8* 14.0*  HGB -- 9.0* 9.7*  PLT -- 734* 871*  LABCREA -- -- --  CREATININE 1.43* 1.34* --   Estimated Creatinine Clearance: 45.8 ml/min (by C-G formula based on Cr of 1.43). No results found for this basename: VANCOTROUGH:2,VANCOPEAK:2,VANCORANDOM:2,GENTTROUGH:2,GENTPEAK:2,GENTRANDOM:2,TOBRATROUGH:2,TOBRAPEAK:2,TOBRARND:2,AMIKACINPEAK:2,AMIKACINTROU:2,AMIKACIN:2, in the last 72 hours   Microbiology: Recent Results (from the past 720 hour(s))  MRSA PCR SCREENING     Status: Normal   Collection Time   03/28/11 12:00 AM      Component Value Range Status Comment   MRSA by PCR NEGATIVE  NEGATIVE  Final   URINE CULTURE     Status: Normal   Collection Time   03/29/11  7:30 PM      Component Value Range Status Comment   Specimen Description URINE, CLEAN CATCH   Final    Special Requests NONE   Final    Culture  Setup Time [id number]   Final    Colony Count NO GROWTH   Final    Culture NO GROWTH   Final    Report Status 03/30/2011 FINAL   Final   CULTURE, BLOOD (ROUTINE X 2)     Status: Normal   Collection Time   03/31/11 10:55 AM      Component Value Range Status Comment   Specimen Description BLOOD LEFT HAND   Final    Special Requests BOTTLES  DRAWN AEROBIC ONLY 4CC   Final    Culture NO GROWTH 5 DAYS   Final    Report Status 04/05/2011 FINAL   Final   MRSA PCR SCREENING     Status: Normal   Collection Time   04/05/11 10:42 PM      Component Value Range Status Comment   MRSA by PCR NEGATIVE  NEGATIVE  Final   CULTURE, BLOOD (ROUTINE X 2)     Status: Normal   Collection Time   04/13/11  6:34 PM      Component Value Range Status Comment   Specimen Description BLOOD LEFT WRIST   Final    Special Requests     Final    Value: BOTTLES DRAWN AEROBIC AND ANAEROBIC AEB=9CC ANA=4CC   Culture NO GROWTH 7 DAYS   Final    Report Status 04/20/2011 FINAL   Final   SURGICAL PCR SCREEN     Status: Normal   Collection Time   04/22/11 12:40 AM      Component Value Range Status Comment   MRSA, PCR NEGATIVE  NEGATIVE  Final    Staphylococcus aureus NEGATIVE  NEGATIVE  Final     Anti-infectives     Start     Dose/Rate Route Frequency Ordered Stop   04/19/11 2000  piperacillin-tazobactam (ZOSYN) IVPB 3.375 g  3.375 g 12.5 mL/hr over 240 Minutes Intravenous 3 times per day 04/19/11 1855     04/19/11 1100   vancomycin (VANCOCIN) 750 mg in sodium chloride 0.9 % 150 mL IVPB        750 mg 150 mL/hr over 60 Minutes Intravenous Every 24 hours 04/19/11 0911     04/17/11 0900   vancomycin (VANCOCIN) IVPB 1000 mg/200 mL premix  Status:  Discontinued        1,000 mg 200 mL/hr over 60 Minutes Intravenous Every 24 hours 04/16/11 1016 04/19/11 0911   04/13/11 0900   vancomycin (VANCOCIN) 1,250 mg in sodium chloride 0.9 % 250 mL IVPB  Status:  Discontinued        1,250 mg 166.7 mL/hr over 90 Minutes Intravenous Every 24 hours 04/12/11 1033 04/16/11 1016   04/12/11 1315   ciprofloxacin (CIPRO) tablet 500 mg  Status:  Discontinued        500 mg Oral 2 times daily 04/12/11 1301 04/20/11 0858   04/10/11 0900   vancomycin (VANCOCIN) IVPB 1000 mg/200 mL premix  Status:  Discontinued        1,000 mg 200 mL/hr over 60 Minutes Intravenous Every 24  hours 04/09/11 1101 04/12/11 1033   04/09/11 1030   ciprofloxacin (CIPRO) IVPB 400 mg  Status:  Discontinued        400 mg 200 mL/hr over 60 Minutes Intravenous Every 12 hours 04/09/11 1025 04/12/11 1300   04/07/11 1100   vancomycin (VANCOCIN) 750 mg in sodium chloride 0.9 % 150 mL IVPB  Status:  Discontinued        750 mg 150 mL/hr over 60 Minutes Intravenous Every 12 hours 04/07/11 1058 04/09/11 1101   04/05/11 2300   vancomycin (VANCOCIN) IVPB 1000 mg/200 mL premix  Status:  Discontinued        1,000 mg 200 mL/hr over 60 Minutes Intravenous Every 12 hours 04/05/11 2252 04/07/11 1058   04/05/11 2300   piperacillin-tazobactam (ZOSYN) IVPB 3.375 g  Status:  Discontinued        3.375 g 12.5 mL/hr over 240 Minutes Intravenous Every 8 hours 04/05/11 2252 04/11/11 0947          Assessment: 40yof s/p BKA on Vancomycin Day 17 and Zosyn Day 3 for foot gangrene/possible osteomyelitis. Patient is afebrile and blood cultures have remained negative. SCr has trended up over the past week but leveled off ~1.4mg /dl. Vancomycin trough measured on 4/1 was slightly supratherapeutic (22) and dose was reduced to 750mg  IV q24h. Will plan to check a follow-up trough tomorrow (4/5) unless antibiotic regimen changed.  Goal of Therapy:  Vancomycin trough level 15-20 mcg/ml  Plan:  1. Continue Vancomycin 750mg  IV q24h 2. Vancomycin trough @ 1000 on 4/5 unless Vancomycin not continued 3. Continue Zosyn 3.375g IV q8h 4. Please clarify antibiotic LOT 5. Continue to monitor renal function, vitals and narrowing of antibiotics  Earleen Newport [id number] 04/22/2011,11:15 AM

## 2011-04-22 NOTE — Preoperative (Signed)
Beta Blockers   Reason not to administer Beta Blockers:Not Applicable 

## 2011-04-22 NOTE — Progress Notes (Signed)
CSW received referral for SNF placement for this pt. She may be a good candidate for CIR, per my discussion with Genie and Pamala Hurry. CSW will hold on SNF placement at this time and will follow-up on Monday pending CIR evaluation.  Jerral Ralph, MSW [phone]

## 2011-04-22 NOTE — Anesthesia Preprocedure Evaluation (Signed)
Anesthesia Evaluation  Patient identified by MRN, date of birth, ID band Patient awake    Reviewed: Allergy & Precautions, H&P , NPO status , Patient's Chart, lab work & pertinent test results  History of Anesthesia Complications Negative for: history of anesthetic complications  Airway Mallampati: II  Neck ROM: Full  Mouth opening: Limited Mouth Opening  Dental  (+) Teeth Intact, Upper Dentures and Lower Dentures   Pulmonary    + decreased breath sounds      Cardiovascular hypertension, + Peripheral Vascular Disease Rhythm:Regular Rate:Normal     Neuro/Psych  Headaches, PSYCHIATRIC DISORDERS CVA    GI/Hepatic   Endo/Other  Diabetes mellitus-, Poorly Controlled, Type 1, Insulin Dependent  Renal/GU      Musculoskeletal   Abdominal (+) + obese,   Peds  Hematology  (+) Blood dyscrasia, anemia ,   Anesthesia Other Findings   Reproductive/Obstetrics                           Anesthesia Physical Anesthesia Plan  ASA: III  Anesthesia Plan: General   Post-op Pain Management:    Induction: Intravenous  Airway Management Planned: Oral ETT  Additional Equipment:   Intra-op Plan:   Post-operative Plan: Extubation in OR  Informed Consent:   Plan Discussed with: CRNA and Surgeon  Anesthesia Plan Comments:         Anesthesia Quick Evaluation

## 2011-04-22 NOTE — Progress Notes (Signed)
Called to OR to pick up and waste PCA pump Morphine. 6mg  Morphine wasted with Nona Dell, RN in [facility]00 pyxis sharps container. Hulen Luster, RN

## 2011-04-22 NOTE — Progress Notes (Signed)
PROGRESS NOTE  [name] [id number] DOB: [birth year] DOA: 04/05/2011 PCP: Alonza Bogus, MD, MD  Interim History: Status post amputation today.  Subjective: Complains of pain.  Objective: Filed Vitals:   04/22/11 1215 04/22/11 1300 04/22/11 1400 04/22/11 1604  BP: 111/51 109/60 109/60   Pulse: 96 90 90   Temp:   98 F (36.7 C)   TempSrc:   Oral   Resp: 11 12  20   Height:      Weight:      SpO2:  99% 99% 100%    Intake/Output Summary (Last 24 hours) at 04/22/11 1654 Last data filed at 04/22/11 0940  Gross per 24 hour  Intake    250 ml  Output    100 ml  Net    150 ml    Exam:   General:  Appears to be in pain.  Cardiovascular: Regular rate and rhythm. No murmur, rub, gallop.   Respiratory: Clear to auscultation bilaterally. No wheezes, rales, rhonchi. Normal respiratory effort.  Musculoskeletal: Status post surgery.  Data Reviewed: Basic Metabolic Panel:  Lab [id number] 04/20/11 2220 04/19/11 0401 04/16/11 0547  NA 138 138 136 140  K 4.6 4.5 -- --  CL 110 108 105 111  CO2 19 20 25 22   GLUCOSE 414* 283* 98 38*  BUN 9 7 7 8   CREATININE 1.43* 1.34* 1.45* 1.27*  CALCIUM 6.9* 7.0* 7.8* 8.3*  MG -- -- -- --  PHOS -- -- -- --   Liver Function Tests:  Lab 04/20/11 2220  AST 11  ALT 8  ALKPHOS 93  BILITOT <0.1*  PROT 4.6*  ALBUMIN 0.6*   CBC:  Lab 04/20/11 2220 04/20/11 0515 04/19/11 0401  WBC 16.8* 14.0* 13.2*  NEUTROABS -- -- 8.7*  HGB 9.0* 9.7* 6.4*  HCT 26.8* 29.2* 19.4*  MCV 88.7 89.8 91.9  PLT 734* 871* 921*   CBG:  Lab 04/22/11 1551 04/22/11 1148 04/22/11 0914 04/22/11 0409 04/21/11 2347  GLUCAP 81 171* 127* 138* 133*    Recent Results (from the past 240 hour(s))  CULTURE, BLOOD (ROUTINE X 2)     Status: Normal   Collection Time   04/13/11  6:34 PM      Component Value Range Status Comment   Specimen Description BLOOD LEFT WRIST   Final    Special Requests     Final    Value: BOTTLES DRAWN AEROBIC AND ANAEROBIC AEB=9CC  ANA=4CC   Culture NO GROWTH 7 DAYS   Final    Report Status 04/20/2011 FINAL   Final   SURGICAL PCR SCREEN     Status: Normal   Collection Time   04/22/11 12:40 AM      Component Value Range Status Comment   MRSA, PCR NEGATIVE  NEGATIVE  Final    Staphylococcus aureus NEGATIVE  NEGATIVE  Final      Studies:  Scheduled Meds:    . alteplase  2 mg Intracatheter Once  . clopidogrel  75 mg Oral Q breakfast  . dextrose  25 mL Intravenous Once  . enoxaparin (LOVENOX) injection  40 mg Subcutaneous Q24H  . feeding supplement  1 Container Oral TID WC  . fentaNYL      . HYDROmorphone      . insulin aspart  0-15 Units Subcutaneous TID WC  . insulin aspart  0-5 Units Subcutaneous QHS  . insulin glargine  10 Units Subcutaneous QHS  . morphine   Intravenous Q4H  . morphine      .  mulitivitamin with minerals  1 tablet Oral Daily  . omega-3 acid ethyl esters  1 g Oral Daily  . pantoprazole  40 mg Oral Q1200  . piperacillin-tazobactam (ZOSYN)  IV  3.375 g Intravenous Q8H  . potassium chloride SA  20 mEq Oral TID  . sertraline  50 mg Oral Daily  . simvastatin  20 mg Oral q1800  . vancomycin  750 mg Intravenous Q24H   Continuous Infusions:    . sodium chloride 125 mL/hr at 04/22/11 1000  . sodium chloride    . DISCONTD: sodium chloride 999 mL/hr at 04/20/11 2123     Assessment/Plan: 1. Osteomyelitis left foot/ischemia: Vancomycin, Zosyn. Status post surgery. Continue pain control. 2. Diabetes mellitus uncontrolled hemoglobin A1c 7.1: Lantus. Sliding-scale insulin. 3. Acute renal failure: IV fluids. Repeat basic metabolic panel morning. 4. Anemia: Stable. Continue to follow. 5. Recent stroke: Coumadin on hold for surgery. Resume, Coumadin when able per surgery.  Code Status: Full code Family Communication: None bedside Disposition Plan: Pending further treatment.   Murray Hodgkins, MD  [facility] Pager [phone] 04/22/2011, 4:54 PM    LOS: 17 days

## 2011-04-22 NOTE — Op Note (Signed)
OPERATIVE REPORT  DATE OF SURGERY: 04/22/2011  PATIENT:  [name],  41 y.o. female  PRE-OPERATIVE DIAGNOSIS:  Left Foot Gangrene  POST-OPERATIVE DIAGNOSIS:  Left Foot Gangrene  PROCEDURE:  Procedure(s): AMPUTATION BELOW KNEE  SURGEON:  Surgeon(s): Newt Minion, MD  ANESTHESIA:   general  EBL:  Minimal ML  SPECIMEN:  Source of Specimen:  Left foot  TOURNIQUET:  * No tourniquets in log *  PROCEDURE DETAILS: Patient is a 41 year old woman with severe peripheral basilar disease with history of thrombus who is status post vascular reconstruction who has developed acute tenderness changes to the left foot. Do to the complete gangrene of the left lower extremity patient presents at this time for a transtibial amputation. Risks and benefits were discussed including infection neurovascular injury nonhealing of the wound potential for an above-the-knee amputation. Patient states she understands and wished to proceed at this time. Consultation with vascular surgery patient was deemed not to be revascularization candidate.  Description of procedure patient was brought to or room 17 and underwent a general anesthetic. After adequate levels of anesthesia were obtained patient's left lower extremity was prepped using DuraPrep draped into a sterile field in the left foot was draped out with an impervious stockinette. A transverse incision was made 10 cm distal to the tibial tubercle was curved proximally and a large posterior flap was created. The bone was beveled anteriorly and resected 1 cm proximal to the tibial incision and the fibula was transected just proximal to the tibial incision. A 21 blade knife was used to create a large posterior flap. The sciatic nerve was pulled cut and allowed to retract. The vascular bundles were suture ligated with 2-0 silk. Hemostasis was obtained. The wound was irrigated the deep and superficial fascial layers were closed using #1 PDS. The skin was closed using  approximate staples. The wound was covered with Adaptic orthopedic sponges AB dressing Kerlix and Coban. Patient was extubated taken to the PACU in stable condition.  PLAN OF CARE: Admit to inpatient   PATIENT DISPOSITION:  PACU - hemodynamically stable.   Newt Minion, MD 04/22/2011 8:25 AM

## 2011-04-22 NOTE — Interval H&P Note (Signed)
History and Physical Interval Note:  04/22/2011 6:21 AM  [name]  has presented today for surgery, with the diagnosis of Left Foot Gangrene  The various methods of treatment have been discussed with the patient and family. After consideration of risks, benefits and other options for treatment, the patient has consented to  Procedure(s) (LRB): AMPUTATION BELOW KNEE (Left) as a surgical intervention .  The patients' history has been reviewed, patient examined, no change in status, stable for surgery.  I have reviewed the patients' chart and labs.  Questions were answered to the patient's satisfaction.     Jessye Imhoff V

## 2011-04-22 NOTE — Progress Notes (Signed)
Received pt as transfer from 3700. She yells/cries out in pain repeatedly. Pt is on PCA morphine for pain control. She must be reminded frequently to use the PCA. She is very confused at times of place; she believes that she is in [place], even after multiple attempts of reminding pt that she is at [facility] in [place]. Will continue to monitor pt. Alferd Patee

## 2011-04-22 NOTE — Progress Notes (Signed)
Glycemic Control Recommendations    Patient very sensitive to insulin. Patient requires Lantus 10 units as well as meal coverage when she is eating more than 50% of meal.  Recommendations:   Decrease to Sensitive Novolog Correction  Add Novolog 3 units TID for meal coverage

## 2011-04-22 NOTE — H&P (View-Only) (Signed)
Patient ID: [name], female   DOB: [birth year], 42 y.o.   MRN: [id number] Plan for left trans tibial amputation Thursday 7:30 AM

## 2011-04-22 NOTE — Progress Notes (Signed)
Pt requested daughter to be called when informed OR was coming at 0530. Daughter called and informed pt was going downstairs at 73am. Daughter states she will come to see pt after surgery. Pt informed will continue to monitor.

## 2011-04-22 NOTE — Anesthesia Procedure Notes (Signed)
Procedure Name: Intubation Date/Time: 04/22/2011 7:51 AM Performed by: Barrington Ellison Pre-anesthesia Checklist: Patient identified, Emergency Drugs available, Suction available, Patient being monitored and Timeout performed Patient Re-evaluated:Patient Re-evaluated prior to inductionOxygen Delivery Method: Circle system utilized Preoxygenation: Pre-oxygenation with 100% oxygen Intubation Type: IV induction Ventilation: Mask ventilation without difficulty Laryngoscope Size: Mac and 3 Grade View: Grade I Tube type: Oral Tube size: 7.0 mm Number of attempts: 1 Airway Equipment and Method: Stylet Placement Confirmation: ETT inserted through vocal cords under direct vision,  breath sounds checked- equal and bilateral and positive ETCO2 Secured at: 21 cm Tube secured with: Tape Dental Injury: Teeth and Oropharynx as per pre-operative assessment

## 2011-04-22 NOTE — Anesthesia Postprocedure Evaluation (Signed)
  Anesthesia Post-op Note  Patient: [name]  Procedure(s) Performed: Procedure(s) (LRB): AMPUTATION BELOW KNEE (Left)  Patient Location: PACU  Anesthesia Type: General  Level of Consciousness: awake  Airway and Oxygen Therapy: Patient Spontanous Breathing  Post-op Pain: mild  Post-op Assessment: Post-op Vital signs reviewed  Post-op Vital Signs: stable  Complications: No apparent anesthesia complications

## 2011-04-22 NOTE — Transfer of Care (Signed)
Immediate Anesthesia Transfer of Care Note  Patient: [name]  Procedure(s) Performed: Procedure(s) (LRB): AMPUTATION BELOW KNEE (Left)  Patient Location: PACU  Anesthesia Type: General  Level of Consciousness: awake, alert  and oriented  Airway & Oxygen Therapy: Patient Spontanous Breathing and Patient connected to nasal cannula oxygen  Post-op Assessment: Report given to PACU RN  Post vital signs: Reviewed and stable  Complications: No apparent anesthesia complications

## 2011-04-23 ENCOUNTER — Encounter (HOSPITAL_COMMUNITY): Payer: Self-pay | Admitting: Orthopedic Surgery

## 2011-04-23 DIAGNOSIS — S88119A Complete traumatic amputation at level between knee and ankle, unspecified lower leg, initial encounter: Secondary | ICD-10-CM

## 2011-04-23 DIAGNOSIS — L98499 Non-pressure chronic ulcer of skin of other sites with unspecified severity: Secondary | ICD-10-CM

## 2011-04-23 DIAGNOSIS — I6330 Cerebral infarction due to thrombosis of unspecified cerebral artery: Secondary | ICD-10-CM

## 2011-04-23 DIAGNOSIS — I739 Peripheral vascular disease, unspecified: Secondary | ICD-10-CM

## 2011-04-23 LAB — CBC
HCT: 27.1 % — ABNORMAL LOW (ref 36.0–46.0)
Hemoglobin: 8.9 g/dL — ABNORMAL LOW (ref 12.0–15.0)
MCH: 30.3 pg (ref 26.0–34.0)
MCHC: 32.8 g/dL (ref 30.0–36.0)
MCV: 92.2 fL (ref 78.0–100.0)
Platelets: 739 10*3/uL — ABNORMAL HIGH (ref 150–400)
RBC: 2.94 MIL/uL — ABNORMAL LOW (ref 3.87–5.11)
RDW: 15.6 % — ABNORMAL HIGH (ref 11.5–15.5)
WBC: 16 10*3/uL — ABNORMAL HIGH (ref 4.0–10.5)

## 2011-04-23 LAB — BASIC METABOLIC PANEL
BUN: 8 mg/dL (ref 6–23)
CO2: 19 mEq/L (ref 19–32)
Calcium: 7.4 mg/dL — ABNORMAL LOW (ref 8.4–10.5)
Chloride: 116 mEq/L — ABNORMAL HIGH (ref 96–112)
Creatinine, Ser: 1.26 mg/dL — ABNORMAL HIGH (ref 0.50–1.10)
GFR calc Af Amer: 60 mL/min — ABNORMAL LOW (ref >90)
GFR calc non Af Amer: 52 mL/min — ABNORMAL LOW (ref >90)
Glucose, Bld: 150 mg/dL — ABNORMAL HIGH (ref 70–99)
Potassium: 4.6 mEq/L (ref 3.5–5.1)
Sodium: 143 mEq/L (ref 135–145)

## 2011-04-23 LAB — PROTIME-INR
INR: 1.73 — ABNORMAL HIGH (ref 0.00–1.49)
Prothrombin Time: 20.6 seconds — ABNORMAL HIGH (ref 11.6–15.2)

## 2011-04-23 LAB — GLUCOSE, CAPILLARY
Glucose-Capillary: 127 mg/dL — ABNORMAL HIGH (ref 70–99)
Glucose-Capillary: 155 mg/dL — ABNORMAL HIGH (ref 70–99)
Glucose-Capillary: 169 mg/dL — ABNORMAL HIGH (ref 70–99)
Glucose-Capillary: 34 mg/dL — CL (ref 70–99)
Glucose-Capillary: 70 mg/dL (ref 70–99)
Glucose-Capillary: 91 mg/dL (ref 70–99)

## 2011-04-23 LAB — ANTITHROMBIN III: AntiThromb III Func: 91 % (ref 75–120)

## 2011-04-23 LAB — VANCOMYCIN, TROUGH: Vancomycin Tr: 11.2 ug/mL (ref 10.0–20.0)

## 2011-04-23 MED ORDER — ASPIRIN EC 81 MG PO TBEC: 81.0000 mg | DELAYED_RELEASE_TABLET | Freq: Every day | ORAL | Status: DC

## 2011-04-23 MED ORDER — WARFARIN SODIUM 3 MG PO TABS
3.0000 mg | ORAL_TABLET | Freq: Once | ORAL | Status: AC
  Administered 2011-04-23: 3 mg via ORAL
  Filled 2011-04-23: qty 1

## 2011-04-23 MED ORDER — VANCOMYCIN HCL 1000 MG IV SOLR
750.0000 mg | Freq: Two times a day (BID) | INTRAVENOUS | Status: DC
  Filled 2011-04-23: qty 750

## 2011-04-23 MED ORDER — WARFARIN - PHARMACIST DOSING INPATIENT
Freq: Every day | Status: DC
  Administered 2011-04-23: 18:00:00

## 2011-04-23 MED ORDER — FENTANYL 12 MCG/HR TD PT72
12.5000 ug | MEDICATED_PATCH | TRANSDERMAL | Status: DC
  Administered 2011-04-23 – 2011-04-26 (×2): 12.5 ug via TRANSDERMAL
  Filled 2011-04-23 (×2): qty 1

## 2011-04-23 MED ORDER — MORPHINE SULFATE (PF) 1 MG/ML IV SOLN
INTRAVENOUS | Status: AC
  Filled 2011-04-23: qty 25

## 2011-04-23 MED ORDER — DEXTROSE 50 % IV SOLN
INTRAVENOUS | Status: AC
  Filled 2011-04-23: qty 50

## 2011-04-23 MED ORDER — MORPHINE SULFATE (PF) 1 MG/ML IV SOLN
INTRAVENOUS | Status: AC
  Administered 2011-04-23: 03:00:00
  Filled 2011-04-23: qty 25

## 2011-04-23 NOTE — Consult Note (Signed)
Physical Medicine and Rehabilitation Consult Reason for Consult: Left BKA Referring Phsyician: Dr. Wilhemina Bonito is an 41 y.o. female.   HPI: 41 year old right-handed female with history of diabetes mellitus, tobacco abuse, renal insufficiency, arterial thromboembolism with chronic Coumadin, stroke and peripheral vascular disease  with history of revascularization as well as recent amputation of left fourth and fifth toes. Patient admitted March 19 [facility] with elevated blood sugars and gangrenous changes of the left foot as well as altered mental status. An MRI of the brain showed multiple areas of acute infarct involving the right cerebellum, right basal ganglia, right posterior temporal parietal and frontal lobes. Patient was seen in consult by neurology services Dr.[place] and placed on Plavix therapy. She ultimately was transferred to [facility] for her peripheral vascular disease placed on broad-spectrum antibiotics. CT imaging a left foot showed osteomyelitis of left foot. Patient underwent left below knee  amputation  April 4 per Dr. Sharol Given. Postoperative pain management. Question plan to resume Coumadin therapy versus maintaining on Plavix after recent stroke. Subcutaneous Lovenox has been added for DVT prophylaxis. Postoperative anemia 8.9 and monitored. Physical and occupational therapy evaluations pending. M.D. has requested physical medicine rehabilitation consult consider inpatient rehabilitation services  Review of Systems  Respiratory: Positive for shortness of breath.   Gastrointestinal: Positive for nausea and constipation.  Musculoskeletal: Positive for myalgias and joint pain.  Neurological: Positive for dizziness and headaches.  Psychiatric/Behavioral: Positive for depression.       Anxiety  All other systems reviewed and are negative.   Past Medical History  Diagnosis Date  . Shingles     11/2010  . Cellulitis     of face - 12/2010  . Peripheral vascular  disease in diabetes mellitus   . Hyperlipidemia   . Tobacco abuse     0.5 - 1ppd x 21 yrs  . Headache   . Renal disorder     glomerulonephritis  . Blood transfusion   . Anxiety   . Depression   . Peripheral vascular disease   . Diabetes mellitus     IDDM Since Age 79  . Embolism - blood clot   . Renal insufficiency    Past Surgical History  Procedure Date  . Wrist surgery     left  . Embolectomy 01/29/2011    Procedure: EMBOLECTOMY;  Surgeon: Mal Misty, MD;  Location: [facility] OR;  Service: Vascular;  Laterality: Left;  Left Popliteal and Tibial Embolectomy with patch angioplasty  . Dilation and curettage of uterus   . Multiple tooth extractions   . Amputation 03/03/2011    Procedure: AMPUTATION DIGIT;  Surgeon: Newt Minion, MD;  Location: [place];  Service: Orthopedics;  Laterality: Left;  Left Foot Amputation 4th and 5th toes at MTP joint  . Tee without cardioversion 04/15/2011    Procedure: TRANSESOPHAGEAL ECHOCARDIOGRAM (TEE);  Surgeon: [name], MD;  Location: [facility];  Service: Cardiovascular;  Laterality: N/A;   Family History  Problem Relation Age of Onset  . COPD Mother     alive - 42  . Hypertension Mother   . Diabetes Mother   . Stroke Mother   . Coronary artery disease Mother   . Diabetes Father     alive - 74  . Hypertension Father   . Coronary artery disease Father   . Anesthesia problems Neg Hx    Social History:  reports that she has been passively smoking Cigarettes.  She has a 21 pack-year smoking  history. She has never used smokeless tobacco. She reports that she uses illicit drugs (Marijuana) about once per week. She reports that she does not drink alcohol. Allergies:  Allergies  Allergen Reactions  . Aspirin Other (See Comments)    Cannot take this medication due to kidney disease  . Ibuprofen Other (See Comments)    Kidney disease   Medications Prior to Admission  Medication Dose Route Frequency Provider Last Rate Last Dose  . 0.9  %  sodium chloride infusion   Intravenous Continuous Ripudeep Krystal Eaton, MD 125 mL/hr at 04/22/11 2114    . 0.9 %  sodium chloride infusion   Intravenous Continuous Newt Minion, MD      . acetaminophen (TYLENOL) tablet 1,000 mg  1,000 mg Oral Q6H PRN Robert Bellow, MD   1,000 mg at 04/19/11 0753  . ALPRAZolam Duanne Moron) tablet 1 mg  1 mg Oral TID PRN Robert Bellow, MD   1 mg at 04/22/11 2243  . alteplase (CATHFLO ACTIVASE) injection 2 mg  2 mg Intracatheter Once Samuella Cota, MD   2 mg at 04/22/11 1231  . amLODipine (NORVASC) tablet 2.5 mg  2.5 mg Oral Once Alonza Bogus, MD   2.5 mg at 04/15/11 1149  . clopidogrel (PLAVIX) tablet 75 mg  75 mg Oral Q breakfast Phillips Odor, MD   75 mg at 04/21/11 0811  . cyanocobalamin ((VITAMIN B-12)) injection 1,000 mcg  1,000 mcg Intramuscular Once Phillips Odor, MD   1,000 mcg at 04/15/11 0848  . dextrose (GLUTOSE) 40 % oral gel 37.5 g  1 Tube Oral PRN Alonza Bogus, MD   37.5 g at 04/21/11 2207  . dextrose 50 % solution 25 mL  25 mL Intravenous PRN Mervin Kung, MD   25 mL at 04/19/11 1743  . dextrose 50 % solution 25 mL  25 mL Intravenous Once PRN Alonza Bogus, MD   25 mL at 04/09/11 1129  . dextrose 50 % solution 25 mL  25 mL Intravenous Once PRN Alonza Bogus, MD   25 mL at 04/12/11 2156  . dextrose 50 % solution 25 mL  25 mL Intravenous Once PRN Alonza Bogus, MD      . dextrose 50 % solution 25 mL  25 mL Intravenous Once Ambrose Finland, NP   25 mL at 04/21/11 2314  . dextrose 50 % solution 50 mL  50 mL Intravenous Once PRN Alonza Bogus, MD   50 mL at 04/16/11 0904  . dextrose 50 % solution           . dextrose 50 % solution        25 mL at 04/09/11 1146  . dextrose 50 % solution           . dextrose 50 % solution           . dextrose 50 % solution        50 mL at 04/16/11 1055  . enoxaparin (LOVENOX) injection 40 mg  40 mg Subcutaneous Q24H Ripudeep K Rai, MD   40 mg at 04/22/11 1212  . feeding supplement (RESOURCE  BREEZE) liquid 1 Container  1 Container Oral TID WC Rogue Bussing, RD   1 Container at 04/22/11 1215  . fentaNYL (SUBLIMAZE) 0.05 MG/ML injection           . HYDROcodone-acetaminophen (NORCO) 5-325 MG per tablet 1-2 tablet  1-2 tablet Oral Q4H PRN Newt Minion, MD      .  HYDROmorphone (DILAUDID) 1 MG/ML injection           . HYDROmorphone (DILAUDID) injection 0.5-1 mg  0.5-1 mg Intravenous Q2H PRN Newt Minion, MD      . HYDROmorphone (DILAUDID) injection 1 mg  1 mg Intravenous Once [name], MD   1 mg at 04/04/11 1152  . HYDROmorphone (DILAUDID) injection 1 mg  1 mg Intravenous Once [name], MD   1 mg at 04/04/11 1238  . HYDROmorphone (DILAUDID) injection 1 mg  1 mg Intravenous Once [name], MD   1 mg at 04/04/11 1326  . HYDROmorphone (DILAUDID) injection 1 mg  1 mg Intravenous Once [name], MD   1 mg at 04/04/11 1456  . HYDROmorphone (DILAUDID) injection 1 mg  1 mg Intravenous Once Mervin Kung, MD   1 mg at 04/05/11 1619  . HYDROmorphone (DILAUDID) injection 2 mg  2 mg Intravenous Once Robert Bellow, MD   2 mg at 04/14/11 0049  . insulin aspart (novoLOG) injection 0-15 Units  0-15 Units Subcutaneous TID WC Ripudeep Krystal Eaton, MD   3 Units at 04/22/11 1211  . insulin aspart (novoLOG) injection 0-5 Units  0-5 Units Subcutaneous QHS Ripudeep K Rai, MD      . insulin aspart (novoLOG) injection 10 Units  10 Units Subcutaneous Once Mervin Kung, MD   10 Units at 04/05/11 1621  . insulin aspart (novoLOG) injection 15 Units  15 Units Intravenous Once [name], MD   15 Units at 04/04/11 1237  . insulin glargine (LANTUS) injection 10 Units  10 Units Subcutaneous Once Robert Bellow, MD   10 Units at 04/06/11 0415  . insulin glargine (LANTUS) injection 10 Units  10 Units Subcutaneous QHS Ripudeep Krystal Eaton, MD   10 Units at 04/22/11 2206  . metoCLOPramide (REGLAN) tablet 5-10 mg  5-10 mg Oral Q8H PRN Newt Minion, MD       Or  . metoCLOPramide  (REGLAN) injection 5-10 mg  5-10 mg Intravenous Q8H PRN Newt Minion, MD      . midazolam (VERSED) 5 MG/5ML injection           . morphine 1 MG/ML PCA injection   Intravenous Q4H Kimberly Ballard Hammons, PHARMD   3 mg at 04/23/11 0400  . morphine 1 MG/ML PCA injection           . morphine 1 MG/ML PCA injection           . morphine 1 MG/ML PCA injection           . mulitivitamin with minerals tablet 1 tablet  1 tablet Oral Daily Robert Bellow, MD   1 tablet at 04/22/11 1027  . mulitivitamin with minerals tablet 1 tablet  1 tablet Oral Once Alonza Bogus, MD   1 tablet at 04/15/11 1149  . omega-3 acid ethyl esters (LOVAZA) capsule 1 g  1 g Oral Daily Robert Bellow, MD   1 g at 04/22/11 1028  . omega-3 acid ethyl esters (LOVAZA) capsule 1 g  1 g Oral Once Alonza Bogus, MD   1 g at 04/15/11 1149  . ondansetron (ZOFRAN) injection 4 mg  4 mg Intravenous Once [name], MD   4 mg at 04/04/11 1151  . ondansetron (ZOFRAN) injection 4 mg  4 mg Intravenous Once Mervin Kung, MD   4 mg at 04/05/11 1619  . ondansetron (ZOFRAN) tablet 4 mg  4 mg Oral Q6H PRN Newt Minion, MD       Or  . ondansetron [facility]) injection 4 mg  4 mg Intravenous Q6H PRN Newt Minion, MD      . ondansetron (ZOFRAN-ODT) disintegrating tablet 8 mg  8 mg Oral Q8H PRN Robert Bellow, MD   8 mg at 04/08/11 0419  . oxyCODONE (Oxy IR/ROXICODONE) immediate release tablet 15 mg  15 mg Oral Once Alonza Bogus, MD   15 mg at 04/15/11 1148  . oxyCODONE (Oxy IR/ROXICODONE) immediate release tablet 5-10 mg  5-10 mg Oral Q6H PRN Ripudeep Krystal Eaton, MD      . oxyCODONE-acetaminophen (PERCOCET) 5-325 MG per tablet 1-2 tablet  1-2 tablet Oral Q4H PRN Newt Minion, MD      . oxyCODONE-acetaminophen (PERCOCET) 5-325 MG per tablet        1 tablet at 04/05/11 2126  . pantoprazole (PROTONIX) EC tablet 40 mg  40 mg Oral Q1200 Elsie Stain, MD   40 mg at 04/22/11 1211  . piperacillin-tazobactam (ZOSYN) IVPB 3.375 g   3.375 g Intravenous Q8H Kimberly Ballard Hammons, PHARMD   3.375 g at 04/23/11 0646  . potassium chloride SA (K-DUR,KLOR-CON) CR tablet 20 mEq  20 mEq Oral TID Alonza Bogus, MD   20 mEq at 04/22/11 2206  . potassium chloride SA (K-DUR,KLOR-CON) CR tablet 20 mEq  20 mEq Oral Once Alonza Bogus, MD   20 mEq at 04/15/11 1149  . regadenoson (LEXISCAN) 0.4 MG/5ML injection SOLN        0.4 mg at 04/09/11 1508  . sertraline (ZOLOFT) tablet 50 mg  50 mg Oral Daily Robert Bellow, MD   50 mg at 04/22/11 1027  . sertraline (ZOLOFT) tablet 50 mg  50 mg Oral Once Alonza Bogus, MD   50 mg at 04/15/11 1149  . simvastatin (ZOCOR) tablet 20 mg  20 mg Oral q1800 Robert Bellow, MD   20 mg at 04/21/11 1809  . sodium chloride 0.9 % bolus 1,000 mL  1,000 mL Intravenous Once [name], MD   1,000 mL at 04/04/11 1152  . sodium chloride 0.9 % bolus 1,000 mL  1,000 mL Intravenous Once Mervin Kung, MD   1,000 mL at 04/05/11 1618  . sodium chloride 0.9 % bolus 1,000 mL  1,000 mL Intravenous Once Ripudeep K Rai, MD   1,000 mL at 04/20/11 1653  . sodium chloride 0.9 % injection 10-40 mL  10-40 mL Intracatheter PRN Ripudeep Krystal Eaton, MD   10 mL at 04/23/11 0456  . sodium chloride 0.9 % injection           . sodium chloride 0.9 % injection        10 mL at 04/09/11 1509  . sodium chloride 0.9 % injection           . sodium chloride 0.9 % injection           . sodium chloride 0.9 % injection        20 mL at 04/12/11 1743  . sodium chloride 0.9 % injection        3 mL at 04/12/11 2044  . sodium chloride 0.9 % injection           . sodium chloride 0.9 % injection           . sodium chloride 0.9 % injection           .  sodium chloride 0.9 % injection           . sodium chloride 0.9 % injection        10 mL at 04/19/11 0802  . sodium chloride 0.9 % injection           . technetium tetrofosmin (TC-MYOVIEW) injection 10 milli Curie  10 milli Curie Intravenous Once PRN Medication Radiologist, MD   [place] at 04/09/11 1100  . technetium tetrofosmin (TC-MYOVIEW) injection 30 milli Curie  30 milli Curie Intravenous Once PRN Medication Radiologist, MD   [place] at 04/09/11 1325  . vancomycin (VANCOCIN) 750 mg in sodium chloride 0.9 % 150 mL IVPB  750 mg Intravenous Q24H Alonza Bogus, MD   750 mg at 04/22/11 1028  . warfarin (COUMADIN) tablet 0.5 mg  0.5 mg Oral ONCE-1800 Alonza Bogus, MD   0.5 mg at 04/15/11 1737  . warfarin (COUMADIN) tablet 2.5 mg  2.5 mg Oral ONCE-1800 Alonza Bogus, MD   2.5 mg at 04/17/11 1626  . zolpidem (AMBIEN) tablet 10 mg  10 mg Oral Once Alonza Bogus, MD   10 mg at 04/08/11 0223  . DISCONTD: 0.45 % sodium chloride infusion   Intravenous Continuous [name], MD      . DISCONTD: 0.45 % sodium chloride infusion   Intravenous Continuous [name], MD      . DISCONTD: 0.9 %  sodium chloride infusion   Intravenous Continuous Mervin Kung, MD      . DISCONTD: 0.9 %  sodium chloride infusion   Intravenous Continuous Mervin Kung, MD 125 mL/hr at 04/05/11 2021    . DISCONTD: 0.9 %  sodium chloride infusion   Intravenous Continuous Robert Bellow, MD 100 mL/hr at 04/06/11 0000    . DISCONTD: 0.9 %  sodium chloride infusion   Intravenous Continuous Alonza Bogus, MD 75 mL/hr at 04/15/11 2230    . DISCONTD: 0.9 %  sodium chloride infusion  250 mL Intravenous PRN [name], MD      . DISCONTD: 0.9 %  sodium chloride infusion   Intravenous Continuous Ambrose Finland, NP 999 mL/hr at 04/20/11 2123    . DISCONTD: 0.9 % irrigation (POUR BTL)    PRN Newt Minion, MD   1,000 mL at 04/22/11 0725  . DISCONTD: amLODipine (NORVASC) tablet 2.5 mg  2.5 mg Oral Daily Robert Bellow, MD   2.5 mg at 04/18/11 1013  . DISCONTD: benzocaine (HURRICAINE) 20 % mouth spray 1 application  1 application Mouth/Throat PRN [name], MD      . DISCONTD: benzocaine (HURRICAINE) 20 % mouth spray 1 application  1 application  Mouth/Throat PRN [name], MD      . DISCONTD: ciprofloxacin (CIPRO) IVPB 400 mg  400 mg Intravenous Q12H Alonza Bogus, MD   400 mg at 04/11/11 2150  . DISCONTD: ciprofloxacin (CIPRO) tablet 500 mg  500 mg Oral BID Alonza Bogus, MD   500 mg at 04/20/11 0809  . DISCONTD: dextrose 5 % solution   Intravenous Continuous Ripudeep Krystal Eaton, MD 75 mL/hr at 04/19/11 0415    . DISCONTD: dextrose 5 %-0.45 % sodium chloride infusion   Intravenous Continuous Robert Bellow, MD 75 mL/hr at 04/07/11 [phone]    . DISCONTD: dextrose 5 %-0.45 % sodium chloride infusion   Intravenous Continuous Ripudeep Krystal Eaton, MD 100 mL/hr at 04/20/11 0926    .  DISCONTD: dextrose 50 % solution 25 mL  25 mL Intravenous PRN Robert Bellow, MD   25 mL at 04/14/11 0030  . DISCONTD: diphenhydrAMINE (BENADRYL) 12.5 MG/5ML elixir 12.5 mg  12.5 mg Oral Q6H PRN Ripudeep Krystal Eaton, MD      . DISCONTD: diphenhydrAMINE (BENADRYL) 12.5 MG/5ML elixir 12.5 mg  12.5 mg Oral Q6H PRN Ripudeep Krystal Eaton, MD      . DISCONTD: diphenhydrAMINE (BENADRYL) 12.5 MG/5ML elixir 12.5 mg  12.5 mg Oral Q6H PRN Ripudeep Krystal Eaton, MD      . DISCONTD: diphenhydrAMINE (BENADRYL) 12.5 MG/5ML elixir 12.5 mg  12.5 mg Oral Q6H PRN Ripudeep K Rai, MD      . DISCONTD: diphenhydrAMINE (BENADRYL) 12.5 MG/5ML elixir 12.5 mg  12.5 mg Oral Q6H PRN Ripudeep K Rai, MD      . DISCONTD: diphenhydrAMINE (BENADRYL) capsule 25 mg  25 mg Oral QHS PRN Robert Bellow, MD   25 mg at 04/17/11 2236  . DISCONTD: diphenhydrAMINE (BENADRYL) injection 12.5 mg  12.5 mg Intravenous Q6H PRN Ripudeep Krystal Eaton, MD      . DISCONTD: diphenhydrAMINE (BENADRYL) injection 12.5 mg  12.5 mg Intravenous Q6H PRN Ripudeep Krystal Eaton, MD      . DISCONTD: diphenhydrAMINE (BENADRYL) injection 12.5 mg  12.5 mg Intravenous Q6H PRN Ripudeep Krystal Eaton, MD      . DISCONTD: diphenhydrAMINE (BENADRYL) injection 12.5 mg  12.5 mg Intravenous Q6H PRN Ripudeep K Rai, MD      . DISCONTD: diphenhydrAMINE (BENADRYL) injection  12.5 mg  12.5 mg Intravenous Q6H PRN Ripudeep K Rai, MD      . DISCONTD: enoxaparin (LOVENOX) injection 100 mg  1.5 mg/kg Subcutaneous Q24H Alonza Bogus, MD      . DISCONTD: enoxaparin (LOVENOX) injection 100 mg  100 mg Subcutaneous Q24H Alonza Bogus, MD   100 mg at 04/20/11 1217  . DISCONTD: enoxaparin (LOVENOX) injection 70 mg  1 mg/kg Subcutaneous Q12H Alonza Bogus, MD   70 mg at 04/11/11 0933  . DISCONTD: fentaNYL (SUBLIMAZE) 0.05 MG/ML injection           . DISCONTD: fentaNYL (SUBLIMAZE) injection 25-50 mcg  25-50 mcg Intravenous Q5 min PRN Pearletha Furl, MD   50 mcg at 04/22/11 0920  . DISCONTD: fentaNYL (SUBLIMAZE) injection 250 mcg  250 mcg Intravenous Once [name], MD      . DISCONTD: fentaNYL (SUBLIMAZE) injection 250 mcg  250 mcg Intravenous Once [name], MD      . DISCONTD: furosemide (LASIX) tablet 20 mg  20 mg Oral 3 times weekly Robert Bellow, MD   20 mg at 04/19/11 0803  . DISCONTD: HYDROmorphone (DILAUDID) injection 0.25-0.5 mg  0.25-0.5 mg Intravenous Q5 min PRN Pearletha Furl, MD   0.5 mg at 04/22/11 0852  . DISCONTD: HYDROmorphone (DILAUDID) injection 1 mg  1 mg Intravenous Q2H PRN Alonza Bogus, MD   1 mg at 04/07/11 0331  . DISCONTD: HYDROmorphone (DILAUDID) injection 1 mg  1 mg Intravenous Q1H PRN Alonza Bogus, MD   1 mg at 04/12/11 0802  . DISCONTD: HYDROmorphone (DILAUDID) injection 1 mg  1 mg Intravenous Q4H PRN Alonza Bogus, MD   1 mg at 04/13/11 2340  . DISCONTD: HYDROmorphone (DILAUDID) injection 2 mg  2 mg Intravenous Q3H PRN Robert Bellow, MD   2 mg at 04/14/11 0703  . DISCONTD: insulin aspart (novoLOG) injection 0-15 Units  0-15 Units Subcutaneous Q6H [place]  Dorris Fetch, MD   5 Units at 04/20/11 [phone]  . DISCONTD: insulin aspart (novoLOG) injection 0-5 Units  0-5 Units Subcutaneous QHS Robert Bellow, MD      . DISCONTD: insulin aspart (novoLOG) injection 0-9 Units  0-9 Units Subcutaneous TID WC  Robert Bellow, MD   2 Units at 04/14/11 0848  . DISCONTD: insulin aspart (novoLOG) injection 10 Units  10 Units Subcutaneous Once Mervin Kung, MD      . DISCONTD: insulin aspart (novoLOG) injection 10 Units  10 Units Subcutaneous TID WC [name], MD   10 Units at 04/10/11 1212  . DISCONTD: insulin aspart (novoLOG) injection 10 Units  10 Units Subcutaneous TID WC [name], MD   10 Units at 04/14/11 0847  . DISCONTD: insulin aspart (novoLOG) injection 5 Units  5 Units Subcutaneous TID AC Robert Bellow, MD      . DISCONTD: insulin aspart (novoLOG) injection 5 Units  5 Units Subcutaneous TID WC [name], MD      . DISCONTD: insulin aspart (novoLOG) injection 8 Units  8 Units Subcutaneous TID WC [name], MD      . DISCONTD: insulin aspart (novoLOG) injection 8 Units  8 Units Subcutaneous TID WC [name], MD      . DISCONTD: insulin aspart (novoLOG) injection 8 Units  8 Units Subcutaneous TID WC [name], MD      . DISCONTD: insulin glargine (LANTUS) injection 10 Units  10 Units Subcutaneous QHS Robert Bellow, MD   10 Units at 04/06/11 2110  . DISCONTD: insulin glargine (LANTUS) injection 10 Units  10 Units Subcutaneous QHS [name], MD   10 Units at 04/18/11 2212  . DISCONTD: insulin glargine (LANTUS) injection 15 Units  15 Units Subcutaneous QHS Robert Bellow, MD      . DISCONTD: insulin glargine (LANTUS) injection 15 Units  15 Units Subcutaneous QHS Maricela Curet, MD      . DISCONTD: insulin glargine (LANTUS) injection 15 Units  15 Units Subcutaneous QHS [name], MD   15 Units at 04/15/11 2201  . DISCONTD: insulin glargine (LANTUS) injection 20 Units  20 Units Subcutaneous QHS [name], MD      . DISCONTD: insulin glargine (LANTUS) injection 26 Units  26 Units Subcutaneous QHS [name], MD   26 Units at 04/08/11 2300  . DISCONTD: insulin glargine (LANTUS) injection 30  Units  30 Units Subcutaneous QHS [name], MD      . DISCONTD: insulin regular (NOVOLIN R,HUMULIN R) 1 Units/mL in sodium chloride 0.9 % 100 mL infusion   Intravenous Continuous Mervin Kung, MD   0.9 Units/hr at 04/05/11 2320  . DISCONTD: insulin regular (NOVOLIN R,HUMULIN R) 1 Units/mL in sodium chloride 0.9 % 100 mL infusion   Intravenous Continuous Robert Bellow, MD      . DISCONTD: insulin regular (NOVOLIN R,HUMULIN R) 100 units/mL injection 10 Units  10 Units Subcutaneous Once Mervin Kung, MD      . DISCONTD: insulin regular bolus via infusion 0-10 Units  0-10 Units Intravenous TID WC Mervin Kung, MD      . DISCONTD: insulin regular bolus via infusion 0-10 Units  0-10 Units Intravenous TID WC Robert Bellow, MD      . DISCONTD: meperidine (DEMEROL) injection 6.25-12.5 mg  6.25-12.5 mg Intravenous Q5 min PRN Pearletha Furl, MD      . DISCONTD: midazolam (VERSED) 5 MG/5ML injection 10 mg  10 mg Intravenous Once  [name], MD      . DISCONTD: midazolam (VERSED) 5 MG/5ML injection    PRN [name], MD   2 mg at 04/15/11 1024  . DISCONTD: midazolam (VERSED) injection 10 mg  10 mg Intravenous Once [name], MD      . DISCONTD: morphine 1 MG/ML PCA injection   Intravenous Q4H Ripudeep Krystal Eaton, MD      . DISCONTD: morphine 1 MG/ML PCA injection   Intravenous Q4H Ripudeep K Rai, MD      . DISCONTD: morphine 1 MG/ML PCA injection   Intravenous Q4H Ripudeep K Rai, MD      . DISCONTD: morphine 1 MG/ML PCA injection   Intravenous Q4H Ripudeep K Rai, MD      . DISCONTD: morphine 4 MG/ML injection 4 mg  4 mg Intravenous Q4H PRN Alonza Bogus, MD   4 mg at 04/19/11 0609  . DISCONTD: morphine 4 MG/ML injection 6 mg  6 mg Intravenous Q2H PRN Alonza Bogus, MD   6 mg at 04/19/11 1745  . DISCONTD: naloxone (NARCAN) injection 0.4 mg  0.4 mg Intravenous PRN Ripudeep Krystal Eaton, MD      . DISCONTD: naloxone (NARCAN) injection 0.4 mg  0.4 mg  Intravenous PRN Ripudeep Krystal Eaton, MD      . DISCONTD: naloxone (NARCAN) injection 0.4 mg  0.4 mg Intravenous PRN Ripudeep Krystal Eaton, MD      . DISCONTD: naloxone (NARCAN) injection 0.4 mg  0.4 mg Intravenous PRN Ripudeep Krystal Eaton, MD      . DISCONTD: naloxone (NARCAN) injection 0.4 mg  0.4 mg Intravenous PRN Ripudeep Krystal Eaton, MD      . DISCONTD: ondansetron (ZOFRAN) injection 4 mg  4 mg Intravenous Q6H PRN Ripudeep Krystal Eaton, MD      . DISCONTD: ondansetron (ZOFRAN) injection 4 mg  4 mg Intravenous Q6H PRN Ripudeep Krystal Eaton, MD      . DISCONTD: ondansetron (ZOFRAN) injection 4 mg  4 mg Intravenous Q6H PRN Ripudeep K Rai, MD      . DISCONTD: ondansetron (ZOFRAN) injection 4 mg  4 mg Intravenous Q6H PRN Ripudeep K Rai, MD      . DISCONTD: ondansetron (ZOFRAN) injection 4 mg  4 mg Intravenous Q6H PRN Ripudeep K Rai, MD      . DISCONTD: oxyCODONE (Oxy IR/ROXICODONE) immediate release tablet 15 mg  15 mg Oral Q6H Alonza Bogus, MD   15 mg at 04/19/11 2140  . DISCONTD: oxyCODONE (Oxy IR/ROXICODONE) immediate release tablet 15 mg  15 mg Oral Q6H PRN Ripudeep Krystal Eaton, MD      . DISCONTD: oxyCODONE-acetaminophen (PERCOCET) 10-325 MG per tablet 1 tablet  1 tablet Oral Q6H PRN Robert Bellow, MD      . DISCONTD: oxyCODONE-acetaminophen (PERCOCET) 5-325 MG per tablet 2 tablet  2 tablet Oral Q6H PRN Robert Bellow, MD   2 tablet at 04/08/11 1225  . DISCONTD: oxyCODONE-acetaminophen (PERCOCET) 5-325 MG per tablet 2 tablet  2 tablet Oral Q6H PRN Alonza Bogus, MD   2 tablet at 04/09/11 0746  . DISCONTD: oxyCODONE-acetaminophen (PERCOCET) 5-325 MG per tablet 2 tablet  2 tablet Oral Q6H Alonza Bogus, MD   2 tablet at 04/11/11 0801  . DISCONTD: piperacillin-tazobactam (ZOSYN) IVPB 3.375 g  3.375 g Intravenous Q8H Robert Bellow, MD   3.375 g at 04/11/11 0620  . DISCONTD: potassium chloride SA (K-DUR,KLOR-CON) CR tablet 10 mEq  10 mEq Oral Daily  Robert Bellow, MD   10 mEq at 04/08/11 [phone]  . DISCONTD:  promethazine (PHENERGAN) injection 6.25-12.5 mg  6.25-12.5 mg Intravenous Q15 min PRN Pearletha Furl, MD      . DISCONTD: sodium chloride 0.9 % injection 3 mL  3 mL Intravenous Q12H [name], MD      . DISCONTD: sodium chloride 0.9 % injection 3 mL  3 mL Intravenous PRN [name], MD      . DISCONTD: sodium chloride 0.9 % injection 9 mL  9 mL Intravenous PRN Ripudeep Krystal Eaton, MD      . DISCONTD: sodium chloride 0.9 % injection 9 mL  9 mL Intravenous PRN Ripudeep Krystal Eaton, MD      . DISCONTD: sodium chloride 0.9 % injection 9 mL  9 mL Intravenous PRN Ripudeep Krystal Eaton, MD      . DISCONTD: sodium chloride 0.9 % injection 9 mL  9 mL Intravenous PRN Ripudeep Krystal Eaton, MD      . DISCONTD: sodium chloride 0.9 % injection 9 mL  9 mL Intravenous PRN Ripudeep Krystal Eaton, MD      . DISCONTD: vancomycin (VANCOCIN) 1,250 mg in sodium chloride 0.9 % 250 mL IVPB  1,250 mg Intravenous Q24H Alonza Bogus, MD   1,250 mg at 04/15/11 0806  . DISCONTD: vancomycin (VANCOCIN) 750 mg in sodium chloride 0.9 % 150 mL IVPB  750 mg Intravenous Q12H Lanette Hampshire, MD   750 mg at 04/08/11 2303  . DISCONTD: vancomycin (VANCOCIN) IVPB 1000 mg/200 mL premix  1,000 mg Intravenous Q12H Robert Bellow, MD   1,000 mg at 04/06/11 2223  . DISCONTD: vancomycin (VANCOCIN) IVPB 1000 mg/200 mL premix  1,000 mg Intravenous Q24H Alonza Bogus, MD   1,000 mg at 04/12/11 1119  . DISCONTD: vancomycin (VANCOCIN) IVPB 1000 mg/200 mL premix  1,000 mg Intravenous Q24H Alonza Bogus, MD   1,000 mg at 04/18/11 1012  . DISCONTD: warfarin (COUMADIN) tablet 3 mg  3 mg Oral q1800 Robert Bellow, MD   3 mg at 04/14/11 1755  . DISCONTD: warfarin (COUMADIN) tablet 4 mg  4 mg Oral ONCE-1800 Alonza Bogus, MD      . DISCONTD: Warfarin - Pharmacist Dosing Inpatient   Does not apply q1800 Alonza Bogus, MD      . DISCONTD: Warfarin - Physician Dosing Inpatient   Does not apply q1800 Robert Bellow, MD       Medications  Prior to Admission  Medication Sig Dispense Refill  . acetaminophen (TYLENOL) 500 MG tablet Take 1,000 mg by mouth every 6 (six) hours as needed. For pain       . ALPRAZolam (XANAX) 1 MG tablet Take 1 mg by mouth daily as needed. For anxiety      . amLODipine (NORVASC) 2.5 MG tablet Take 1 tablet (2.5 mg total) by mouth daily.  30 tablet  5  . cephALEXin (KEFLEX) 500 MG capsule Take 500 mg by mouth 3 (three) times daily.      . diphenhydrAMINE (BENADRYL) 25 MG tablet Take 25 mg by mouth at bedtime as needed. For sleep      . fish oil-omega-3 fatty acids 1000 MG capsule Take 1 g by mouth daily.      . furosemide (LASIX) 20 MG tablet Take 20 mg by mouth 3 (three) times a week. On Mondays, Wednesdays, and Fridays      . insulin aspart (NOVOLOG) 100 UNIT/ML injection Inject  5 Units into the skin 3 (three) times daily before meals.  1 vial  12  . insulin glargine (LANTUS) 100 UNIT/ML injection Inject 15 Units into the skin at bedtime.  10 mL  12  . Multiple Vitamins-Minerals (MULTIVITAMINS THER. W/MINERALS) TABS Take 1 tablet by mouth daily.        . ondansetron (ZOFRAN-ODT) 8 MG disintegrating tablet Take 8 mg by mouth every 12 (twelve) hours as needed. Nausea      . oxyCODONE-acetaminophen (PERCOCET) 10-325 MG per tablet Take 1 tablet by mouth every 6 (six) hours as needed for pain.  30 tablet  0  . potassium chloride SA (K-DUR,KLOR-CON) 20 MEQ tablet Take 0.5 tablets (10 mEq total) by mouth daily.  7 tablet  0  . pravastatin (PRAVACHOL) 40 MG tablet Take 1 tablet (40 mg total) by mouth daily.  30 tablet  5  . sertraline (ZOLOFT) 50 MG tablet Take 50 mg by mouth daily.       Marland Kitchen warfarin (COUMADIN) 3 MG tablet Take 3 mg by mouth daily.        Home: Home Living Lives With: Family Receives Help From: Family Type of Home: House Home Layout: One level Home Access: Stairs to enter Entrance Stairs-Rails: Right;Left;None Entrance Stairs-Number of Steps: 5---states that she has had difficulty getting  up the steps PTA Home Adaptive Equipment: Walker - rolling;Wheelchair - manual Additional Comments: uses w/c for activities outside of the house  Functional History: Prior Function Level of Independence: Independent with basic ADLs;Independent with gait;Independent with transfers Driving: No Vocation: Retired Functional Status:  Mobility: Bed Mobility Bed Mobility: No (unable to move in bed due to severe pain) Transfers Transfers: No (due to pain) Ambulation/Gait Ambulation/Gait: No Stairs: No Wheelchair Mobility Wheelchair Mobility: No  ADL:    Cognition: Cognition Arousal/Alertness: Awake/alert Orientation Level: Oriented to person;Disoriented to place;Disoriented to time;Disoriented to situation Cognition Arousal/Alertness: Awake/alert Overall Cognitive Status: Appears within functional limits for tasks assessed Orientation Level: Oriented to person;Disoriented to place;Disoriented to time;Disoriented to situation Cognition - Other Comments: occasionally appears confused and has difficulty following directions, but then seems very clear  Blood pressure 124/60, pulse 98, temperature 98.4 F (36.9 C), temperature source Oral, resp. rate 18, height 5\' 1"  (1.549 m), weight 68.312 kg (150 lb 9.6 oz), last menstrual period 12/28/2010, SpO2 100.00%. Physical Exam  Constitutional: She is oriented to person, place, and time.       Anxious female tearful appears older than stated age  HENT:  Head: Normocephalic.  Eyes: Conjunctivae and EOM are normal. Pupils are equal, round, and reactive to light.  Neck: Normal range of motion. Neck supple. No thyromegaly present.  Cardiovascular: Normal rate and regular rhythm.   Pulmonary/Chest: Effort normal and breath sounds normal. She has no wheezes.  Abdominal: Soft. She exhibits no distension. There is no tenderness.  Musculoskeletal:       Left leg is wrapped and extremely tender to touch. She really could not tolerate medially  touching the limb. The sheets touching the leg irritated the extremity.  Neurological: She is alert and oriented to person, place, and time.       In the affected limbs there is no gross motor deficits. No obvious sensory loss was seen.  Skin:       BKA site dressed  Psychiatric: Her speech is normal. Cognition and memory are normal.       Patient is anxious with ongoing complaints of pain and easily becomes tearful during exam  Results for orders placed during the hospital encounter of 04/05/11 (from the past 24 hour(s))  GLUCOSE, CAPILLARY     Status: Abnormal   Collection Time   04/22/11  9:14 AM      Component Value Range   Glucose-Capillary 127 (*) 70 - 99 (mg/dL)  PROTIME-INR     Status: Abnormal   Collection Time   04/22/11 10:40 AM      Component Value Range   Prothrombin Time 18.6 (*) 11.6 - 15.2 (seconds)   INR 1.52 (*) 0.00 - 1.49   GLUCOSE, CAPILLARY     Status: Abnormal   Collection Time   04/22/11 11:48 AM      Component Value Range   Glucose-Capillary 171 (*) 70 - 99 (mg/dL)   Comment 1 Notify RN    GLUCOSE, CAPILLARY     Status: Normal   Collection Time   04/22/11  3:51 PM      Component Value Range   Glucose-Capillary 81  70 - 99 (mg/dL)   Comment 1 Documented in Chart     Comment 2 Notify RN    GLUCOSE, CAPILLARY     Status: Abnormal   Collection Time   04/22/11  8:10 PM      Component Value Range   Glucose-Capillary 158 (*) 70 - 99 (mg/dL)   Comment 1 Documented in Chart     Comment 2 Notify RN    GLUCOSE, CAPILLARY     Status: Abnormal   Collection Time   04/22/11  9:21 PM      Component Value Range   Glucose-Capillary 176 (*) 70 - 99 (mg/dL)   Comment 1 Documented in Chart     Comment 2 Notify RN    GLUCOSE, CAPILLARY     Status: Abnormal   Collection Time   04/23/11  1:55 AM      Component Value Range   Glucose-Capillary 169 (*) 70 - 99 (mg/dL)   Comment 1 Documented in Chart     Comment 2 Notify RN    BASIC METABOLIC PANEL     Status: Abnormal    Collection Time   04/23/11  4:55 AM      Component Value Range   Sodium 143  135 - 145 (mEq/L)   Potassium 4.6  3.5 - 5.1 (mEq/L)   Chloride 116 (*) 96 - 112 (mEq/L)   CO2 19  19 - 32 (mEq/L)   Glucose, Bld 150 (*) 70 - 99 (mg/dL)   BUN 8  6 - 23 (mg/dL)   Creatinine, Ser 1.26 (*) 0.50 - 1.10 (mg/dL)   Calcium 7.4 (*) 8.4 - 10.5 (mg/dL)   GFR calc non Af Amer 52 (*) >90 (mL/min)   GFR calc Af Amer 60 (*) >90 (mL/min)  PROTIME-INR     Status: Abnormal   Collection Time   04/23/11  4:55 AM      Component Value Range   Prothrombin Time 20.6 (*) 11.6 - 15.2 (seconds)   INR 1.73 (*) 0.00 - 1.49   CBC     Status: Abnormal   Collection Time   04/23/11  4:55 AM      Component Value Range   WBC 16.0 (*) 4.0 - 10.5 (K/uL)   RBC 2.94 (*) 3.87 - 5.11 (MIL/uL)   Hemoglobin 8.9 (*) 12.0 - 15.0 (g/dL)   HCT 27.1 (*) 36.0 - 46.0 (%)   MCV 92.2  78.0 - 100.0 (fL)   MCH 30.3  26.0 - 34.0 (pg)  MCHC 32.8  30.0 - 36.0 (g/dL)   RDW 15.6 (*) 11.5 - 15.5 (%)   Platelets 739 (*) 150 - 400 (K/uL)  GLUCOSE, CAPILLARY     Status: Abnormal   Collection Time   04/23/11  6:23 AM      Component Value Range   Glucose-Capillary 155 (*) 70 - 99 (mg/dL)   Comment 1 Documented in Chart     Comment 2 Notify RN     Dg Chest 2 View  04/21/2011  *RADIOLOGY REPORT*  Clinical Data: Shortness of breath.  Preop for for the patient.  CHEST - 2 VIEW  Comparison: Chest x-ray 01/30/2011.  Findings: The right PICC line is in good position with the tip in the distal SVC.  The cardiac silhouette, mediastinal and hilar contours are stable.  The lungs are clear. There are small pleural effusions.  The bony thorax is intact.  IMPRESSION:  1.  Right PICC line tip in good position. 2.  Small bilateral pleural effusions.  Original Report Authenticated By: P. Kalman Jewels, M.D.    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 Does the need for close, 24 hr/day  medical supervision in concert with the patient's rehab needs make it unreasonable for this patient to be served in a less intensive setting? Potentially 2. Co-Morbidities requiring supervision/potential complications: Diabetes, anxiety 3. Due to bladder management, bowel management, safety, skin/wound care, disease management, medication administration, pain management and patient education, does the patient require 24 hr/day rehab nursing? Yes 4. Does the patient require coordinated care of a physician, rehab nurse, PT (1-2 hrs/day, 5 days/week) and OT (1-2 hrs/day, 5 days/week) to address physical and functional deficits in the context of the above medical diagnosis(es)? Potentially Addressing deficits in the following areas: balance, endurance, locomotion, strength, transferring, bowel/bladder control, bathing, dressing, feeding, grooming and toileting 5. Can the patient actively participate in an intensive therapy program of at least 3 hrs of therapy per day at least 5 days per week? Potentially 6. The potential for patient to make measurable gains while on inpatient rehab is good 7. Anticipated functional outcomes upon discharge from inpatients are wheelchair modified independent to supervision PT, wheelchair modified independent to supervision OT 8. Estimated rehab length of stay to reach the above functional goals is: Potentially 7-14 days 9. Does the patient have adequate social supports to accommodate these discharge functional goals? Potentially 10. Anticipated D/C setting: Home 11. Anticipated post D/C treatments: [place] therapy 12. Overall Rehab/Functional Prognosis: good  RECOMMENDATIONS: This patient's condition is appropriate for continued rehabilitative care in the following setting: CIR Patient has agreed to participate in recommended program. Potentially Note that insurance prior authorization may be required for reimbursement for recommended care.  Comment: Patient is interested in  inpatient rehabilitation. However she will need to demonstrate better activity tolerance than I saw today. Obviously she is only postoperative day #1, so there is plenty of opportunity to improve.   Meredith Staggers M.D. 04/23/2011

## 2011-04-23 NOTE — Progress Notes (Signed)
ANTIBIOTIC CONSULT NOTE - FOLLOW UP  Pharmacy Consult for Vancomycin/Zosyn Indication: foot gangrene/possible osteomyelitis   Allergies  Allergen Reactions  . Aspirin Other (See Comments)    Cannot take this medication due to kidney disease  . Ibuprofen Other (See Comments)    Kidney disease   Vital Signs: Temp: 98.4 F (36.9 C) (04/05 0514) BP: 124/60 mmHg (04/05 0514) Pulse Rate: 98  (04/05 0514) Intake/Output from previous day: 04/04 0701 - 04/05 0700 In: 775 [I.V.:725; IV Piggyback:50] Out: 100 [Blood:100] Intake/Output from this shift:    Labs:  Basename 04/23/11 0455 04/21/11 0615 04/20/11 2220  WBC 16.0* -- 16.8*  HGB 8.9* -- 9.0*  PLT 739* -- 734*  LABCREA -- -- --  CREATININE 1.26* 1.43* 1.34*   Estimated Creatinine Clearance: 51.9 ml/min (by C-G formula based on Cr of 1.26).  Basename 04/23/11 1000  VANCOTROUGH 11.2  VANCOPEAK --  VANCORANDOM --  GENTTROUGH --  GENTPEAK --  GENTRANDOM --  TOBRATROUGH --  TOBRAPEAK --  TOBRARND --  AMIKACINPEAK --  AMIKACINTROU --  AMIKACIN --     Microbiology: Recent Results (from the past 720 hour(s))  MRSA PCR SCREENING     Status: Normal   Collection Time   03/28/11 12:00 AM      Component Value Range Status Comment   MRSA by PCR NEGATIVE  NEGATIVE  Final   URINE CULTURE     Status: Normal   Collection Time   03/29/11  7:30 PM      Component Value Range Status Comment   Specimen Description URINE, CLEAN CATCH   Final    Special Requests NONE   Final    Culture  Setup Time [id number]   Final    Colony Count NO GROWTH   Final    Culture NO GROWTH   Final    Report Status 03/30/2011 FINAL   Final   CULTURE, BLOOD (ROUTINE X 2)     Status: Normal   Collection Time   03/31/11 10:55 AM      Component Value Range Status Comment   Specimen Description BLOOD LEFT HAND   Final    Special Requests BOTTLES DRAWN AEROBIC ONLY 4CC   Final    Culture NO GROWTH 5 DAYS   Final    Report Status 04/05/2011 FINAL    Final   MRSA PCR SCREENING     Status: Normal   Collection Time   04/05/11 10:42 PM      Component Value Range Status Comment   MRSA by PCR NEGATIVE  NEGATIVE  Final   CULTURE, BLOOD (ROUTINE X 2)     Status: Normal   Collection Time   04/13/11  6:34 PM      Component Value Range Status Comment   Specimen Description BLOOD LEFT WRIST   Final    Special Requests     Final    Value: BOTTLES DRAWN AEROBIC AND ANAEROBIC AEB=9CC ANA=4CC   Culture NO GROWTH 7 DAYS   Final    Report Status 04/20/2011 FINAL   Final   SURGICAL PCR SCREEN     Status: Normal   Collection Time   04/22/11 12:40 AM      Component Value Range Status Comment   MRSA, PCR NEGATIVE  NEGATIVE  Final    Staphylococcus aureus NEGATIVE  NEGATIVE  Final     Anti-infectives     Start     Dose/Rate Route Frequency Ordered Stop   04/19/11 2000  piperacillin-tazobactam (ZOSYN)  IVPB 3.375 g       3.375 g 12.5 mL/hr over 240 Minutes Intravenous 3 times per day 04/19/11 1855     04/19/11 1100   vancomycin (VANCOCIN) 750 mg in sodium chloride 0.9 % 150 mL IVPB        750 mg 150 mL/hr over 60 Minutes Intravenous Every 24 hours 04/19/11 0911     04/17/11 0900   vancomycin (VANCOCIN) IVPB 1000 mg/200 mL premix  Status:  Discontinued        1,000 mg 200 mL/hr over 60 Minutes Intravenous Every 24 hours 04/16/11 1016 04/19/11 0911   04/13/11 0900   vancomycin (VANCOCIN) 1,250 mg in sodium chloride 0.9 % 250 mL IVPB  Status:  Discontinued        1,250 mg 166.7 mL/hr over 90 Minutes Intravenous Every 24 hours 04/12/11 1033 04/16/11 1016   04/12/11 1315   ciprofloxacin (CIPRO) tablet 500 mg  Status:  Discontinued        500 mg Oral 2 times daily 04/12/11 1301 04/20/11 0858   04/10/11 0900   vancomycin (VANCOCIN) IVPB 1000 mg/200 mL premix  Status:  Discontinued        1,000 mg 200 mL/hr over 60 Minutes Intravenous Every 24 hours 04/09/11 1101 04/12/11 1033   04/09/11 1030   ciprofloxacin (CIPRO) IVPB 400 mg  Status:  Discontinued         400 mg 200 mL/hr over 60 Minutes Intravenous Every 12 hours 04/09/11 1025 04/12/11 1300   04/07/11 1100   vancomycin (VANCOCIN) 750 mg in sodium chloride 0.9 % 150 mL IVPB  Status:  Discontinued        750 mg 150 mL/hr over 60 Minutes Intravenous Every 12 hours 04/07/11 1058 04/09/11 1101   04/05/11 2300   vancomycin (VANCOCIN) IVPB 1000 mg/200 mL premix  Status:  Discontinued        1,000 mg 200 mL/hr over 60 Minutes Intravenous Every 12 hours 04/05/11 2252 04/07/11 1058   04/05/11 2300   piperacillin-tazobactam (ZOSYN) IVPB 3.375 g  Status:  Discontinued        3.375 g 12.5 mL/hr over 240 Minutes Intravenous Every 8 hours 04/05/11 2252 04/11/11 0947         Assessment: 40yof continues on Vancomycin #18 and Zosyn #4 for foot gangrene/possible osteo. POD #1 left transtibial amputation.  Renal function slightly improved today as sCr down to 1.26.  Will need to increase Vancomycin dose as today's trough is subtherapeutic. Zosyn dose appropriate.  Vanc trough 4/1 = 22 on 1g q24 Vanc trough 4/5 = 11.2 on 750mg  q24  Goal of Therapy:  Vancomycin trough level 15-20 mcg/ml  Plan:  1) Change Vancomycin to 750mg  q12 2) Continue Zosyn 3.375g q8 3) Follow renal function, check another trough at new steady state  Deboraha Sprang 04/23/2011,11:34 AM

## 2011-04-23 NOTE — Progress Notes (Signed)
Nutrition Follow-up  Diet Order:  CHO Mod Medium  Pt diet has been advanced to CHO Modified, PO intake remains suboptimal.  RD unable to talk with pt at time of visit- pt not appropriately alert and able to answer questions. Pt has been given supplements, but it is difficult to tell if pt is drinking them.  Pt s/p Left BKA (4/4).  Meds: Scheduled Meds:   . clopidogrel  75 mg Oral Q breakfast  . enoxaparin (LOVENOX) injection  40 mg Subcutaneous Q24H  . feeding supplement  1 Container Oral TID WC  . fentaNYL  12.5 mcg Transdermal Q72H  . fentaNYL      . HYDROmorphone      . insulin aspart  0-15 Units Subcutaneous TID WC  . insulin aspart  0-5 Units Subcutaneous QHS  . insulin glargine  10 Units Subcutaneous QHS  . morphine   Intravenous Q4H  . morphine      . mulitivitamin with minerals  1 tablet Oral Daily  . omega-3 acid ethyl esters  1 g Oral Daily  . pantoprazole  40 mg Oral Q1200  . potassium chloride SA  20 mEq Oral TID  . sertraline  50 mg Oral Daily  . simvastatin  20 mg Oral q1800  . warfarin  3 mg Oral ONCE-1800  . Warfarin - Pharmacist Dosing Inpatient   Does not apply q1800  . DISCONTD: piperacillin-tazobactam (ZOSYN)  IV  3.375 g Intravenous Q8H  . DISCONTD: vancomycin  750 mg Intravenous Q24H  . DISCONTD: vancomycin  750 mg Intravenous Q12H   Continuous Infusions:   . sodium chloride    . DISCONTD: sodium chloride 125 mL/hr at 04/22/11 2114   PRN Meds:.acetaminophen, ALPRAZolam, dextrose, dextrose, HYDROcodone-acetaminophen, HYDROmorphone, metoCLOPramide (REGLAN) injection, metoCLOPramide, ondansetron (ZOFRAN) IV, ondansetron, ondansetron, oxyCODONE, oxyCODONE-acetaminophen, sodium chloride  Labs:  CMP     Component Value Date/Time   NA 143 04/23/2011 0455   K 4.6 04/23/2011 0455   CL 116* 04/23/2011 0455   CO2 19 04/23/2011 0455   GLUCOSE 150* 04/23/2011 0455   BUN 8 04/23/2011 0455   CREATININE 1.26* 04/23/2011 0455   CALCIUM 7.4* 04/23/2011 0455   PROT 4.6* 04/20/2011  2220   ALBUMIN 0.6* 04/20/2011 2220   AST 11 04/20/2011 2220   ALT 8 04/20/2011 2220   ALKPHOS 93 04/20/2011 2220   BILITOT <0.1* 04/20/2011 2220   GFRNONAA 52* 04/23/2011 0455   GFRAA 60* 04/23/2011 0455     Intake/Output Summary (Last 24 hours) at 04/23/11 1453 Last data filed at 04/22/11 2248  Gross per 24 hour  Intake    525 ml  Output      0 ml  Net    525 ml    Weight Status:  Not likely accurate Admission wt: 140 lbs Current wt: 150 lbs s/p BKA   Nutrition Dx:  Inadequate oral intake, ongoing  Intervention:   1.  General healthful diet; encourage intake as able.  Make sure pt is fully alert and able to take POs safely.  PO intake averaging 50% in days leading up to BKA.    Monitor:   1.  Food/Beverage; improvement in intake to >50% of meals.  Continue supplements.  Goal of pt meeting >90% of needs not likely being met.  Docia Barrier Pager #:  [phone]

## 2011-04-23 NOTE — Progress Notes (Signed)
ANTICOAGULATION CONSULT NOTE - Follow Up Consult  Pharmacy Consult for Coumadin Indication: history of thromboembolic occlusion the left lower extremities, new CVA   Allergies  Allergen Reactions  . Aspirin Other (See Comments)    Cannot take this medication due to kidney disease  . Ibuprofen Other (See Comments)    Kidney disease  . Losartan Swelling   Vital Signs: Temp: 98.4 F (36.9 C) (04/05 0514) BP: 124/60 mmHg (04/05 0514) Pulse Rate: 98  (04/05 0514)  Labs:  Basename 04/23/11 0455 04/22/11 1040 04/21/11 0615 04/20/11 2220  HGB 8.9* -- -- 9.0*  HCT 27.1* -- -- 26.8*  PLT 739* -- -- 734*  APTT -- -- -- 45*  LABPROT 20.6* 18.6* 19.5* --  INR 1.73* 1.52* 1.62* --  HEPARINUNFRC -- -- -- --  CREATININE 1.26* -- 1.43* 1.34*  CKTOTAL -- -- -- --  CKMB -- -- -- --  TROPONINI -- -- -- --   Estimated Creatinine Clearance: 51.9 ml/min (by C-G formula based on Cr of 1.26).  Assessment: 40yof on coumadin pta (3mg  daily) for history of clots to her lower extremities. Coumadin was placed on hold 04/18/11 for BKA on 04/22/11. She is now s/p BKA to resume coumadin. INR 1.73 even though she has not had a dose since 04/17/11. 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  Goal of Therapy:  INR 2-3   Plan:  1) Coumadin 3mg  x 1 2) Daily INR 3) Continue lovenox 40mg  q24 until INR therapeutic  Deboraha Sprang 04/23/2011,2:09 PM

## 2011-04-23 NOTE — Evaluation (Addendum)
Physical Therapy Evaluation Patient Details Name: [name] MRN: [id number] DOB: January 14, 1971 Today's Date: 04/23/2011  Problem List:  Patient Active Problem List  Diagnoses  . Diabetes mellitus  . Peripheral vascular disease in diabetes mellitus  . Hyperlipidemia  . Tobacco abuse  . Abnormal ECG  . Thromboembolism of lower extremity artery  . Cellulitis of left foot  . Osteomyelitis of foot, left, acute  . Cerebral embolus with cerebral infarction  . Delirium    Past Medical History:  Past Medical History  Diagnosis Date  . Shingles     11/2010  . Cellulitis     of face - 12/2010  . Peripheral vascular disease in diabetes mellitus   . Hyperlipidemia   . Tobacco abuse     0.5 - 1ppd x 21 yrs  . Headache   . Renal disorder     glomerulonephritis  . Blood transfusion   . Anxiety   . Depression   . Peripheral vascular disease   . Diabetes mellitus     IDDM Since Age 24  . Embolism - blood clot   . Renal insufficiency    Past Surgical History:  Past Surgical History  Procedure Date  . Wrist surgery     left  . Embolectomy 01/29/2011    Procedure: EMBOLECTOMY;  Surgeon: Mal Misty, MD;  Location: [facility] OR;  Service: Vascular;  Laterality: Left;  Left Popliteal and Tibial Embolectomy with patch angioplasty  . Dilation and curettage of uterus   . Multiple tooth extractions   . Amputation 03/03/2011    Procedure: AMPUTATION DIGIT;  Surgeon: Newt Minion, MD;  Location: [place];  Service: Orthopedics;  Laterality: Left;  Left Foot Amputation 4th and 5th toes at MTP joint  . Tee without cardioversion 04/15/2011    Procedure: TRANSESOPHAGEAL ECHOCARDIOGRAM (TEE);  Surgeon: [name], MD;  [facility];  Service: Cardiovascular;  Laterality: N/A;    PT Assessment/Plan/Recommendation PT Assessment Clinical Impression Statement: Pt admitted for L transtibial amputation and currently presents with decreased functional mobility, sitting balance, activity  tolerance, and is limited by pain and cognitive impairment. At this time, it is difficult to assess which cognitive changes are baseline vs medication related vs recent CVA, but will contiue to assess. Pt very emotional at time of eval and was unable to bring L hip/knee out of flexed position, largely due to pain. Pt may benefit from [facility] or other orthosis to assist with optimal positioning.  Pt will benefit from skilled PT to address below impairments and increase independence with functional mobility for safe DC to next venue of care with decreased caregiver burden and falls risk.  PT Recommendation/Assessment: Patient will need skilled PT in the acute care venue PT Problem List: Decreased strength;Decreased range of motion;Decreased activity tolerance;Decreased balance;Decreased mobility;Decreased cognition;Decreased knowledge of use of DME;Decreased safety awareness;Decreased knowledge of precautions;Decreased skin integrity;Pain PT Therapy Diagnosis : Difficulty walking;Acute pain PT Plan PT Frequency: Min 3X/week PT Treatment/Interventions: DME instruction;Gait training;Functional mobility training;Therapeutic activities;Therapeutic exercise;Balance training;Neuromuscular re-education;Cognitive remediation;Patient/family education;Wheelchair mobility training PT Recommendation Recommendations for Other Services: Rehab consult;OT consult Follow Up Recommendations: Inpatient Rehab;Skilled nursing facility;Supervision/Assistance - 24 hour Equipment Recommended: Defer to next venue PT Goals  Acute Rehab PT Goals PT Goal Formulation: With patient Time For Goal Achievement: 7 days Pt will go Supine/Side to Sit: with min assist;with HOB 0 degrees;with rail PT Goal: Supine/Side to Sit - Progress: Goal set today Pt will go Sit to Supine/Side: with supervision;with HOB 0 degrees;with  rail PT Goal: Sit to Supine/Side - Progress: Goal set today Pt will go Sit to Stand: with min assist;with upper extremity  assist PT Goal: Sit to Stand - Progress: Goal set today Pt will go Stand to Sit: with supervision;with upper extremity assist PT Goal: Stand to Sit - Progress: Goal set today Pt will Transfer Bed to Chair/Chair to Bed: with mod assist PT Transfer Goal: Bed to Chair/Chair to Bed - Progress: Goal set today Pt will Ambulate: 1 - 15 feet;with mod assist;with rolling walker PT Goal: Ambulate - Progress: Goal set today Pt will Perform Home Exercise Program: with supervision, verbal cues required/provided PT Goal: Perform Home Exercise Program - Progress: Goal set today  PT Evaluation Precautions/Restrictions  Precautions Precautions: Fall Required Braces or Orthoses: No Restrictions Weight Bearing Restrictions: Yes LLE Weight Bearing: Non weight bearing LLE Partial Weight Bearing Percentage or Pounds: BKA Prior Functioning  Home Living Lives With: Family Receives Help From: Family Type of Home: Apartment Home Layout: One level Home Access: Stairs to enter Entrance Stairs-Rails: Can reach both Entrance Stairs-Number of Steps: 5---states that she has had difficulty getting up the steps PTA Bathroom Shower/Tub: Walk-in shower;Tub/shower unit Bathroom Toilet: Standard Bathroom Accessibility: Yes How Accessible: Accessible via walker Home Adaptive Equipment: Bedside commode/3-in-1;Grab bars around toilet;Grab bars in shower;Straight cane;Walker - rolling;Shower chair without back;Wheelchair - manual Additional Comments: Pt with some confusion at eval, may need clarifiaction to confirm home set-up and equipment before eventual DC home. Uses RW in house and w/c for activities outside of the house Prior Function Level of Independence: Needs assistance with ADLs;Needs assistance with homemaking;Needs assistance with tranfers;Needs assistance with gait Able to Take Stairs?: Yes Driving: No Vocation: Retired Comments: Pt states her daughter helps with ADLs and cleaning, boyfriend helps with  transfers.  Cognition Cognition Arousal/Alertness: Awake/alert Overall Cognitive Status: Impaired Orientation Level: Oriented to person;Disoriented to place;Disoriented to time;Disoriented to situation Cognition - Other Comments: Pt highly emotional at present, and inconsistent with home living situation, will need more clarification as cognition clears. Pt think she's at [facility] and that her boyfriend is presents, but is not at this time.  Sensation/Coordination Sensation [name] Touch: Appears Intact (Hypersensitive LLE ) Additional Comments: Pt with extreem L hip/knee flexion and is unable to extend either in supine or sitting due to pain. Pt given verbal cues and manual assistance to relax and extend hip/knee. She was able to do so with assist, but not fully. She will not look at LLE because it's "scary and ugly." Pt educated on importance of maintaining extension.  Coordination Gross Motor Movements are Fluid and Coordinated: Yes Fine Motor Movements are Fluid and Coordinated: No (Mild tremmor noted with targeted UE use. ) Extremity Assessment RUE Assessment RUE Assessment: Within Functional Limits LUE Assessment LUE Assessment: Within Functional Limits RLE Assessment RLE Assessment: Within Functional Limits LLE Assessment LLE Assessment: Exceptions to WFL LLE AROM (degrees) LLE Overall AROM Comments: Hip and knee in active flexion throughout tx, unable/willing to extend in supine or sit  Mobility (including Balance) Bed Mobility Bed Mobility: Yes Supine to Sit: 2: Max assist;With rails;HOB elevated (Comment degrees) (HOB 40 deg) Supine to Sit Details (indicate cue type and reason): Max verbal cues for initiation, sequence, hand placement, and technique turning to sit from elevated HOB position. Multiple cues to open eyes and continue transfer. Max assist for turning hips with pad and lifting trunk. Pt continually tried to lay back down Sitting - Scoot to Edge of Bed: 2: Max  assist;With rail Sitting - Scoot to [place] of Bed Details (indicate cue type and reason): Cues for hand placement and technique. Pt able to assist 25% Transfers Transfers: Yes Squat Pivot Transfers: 1: +2 Total assist;With upper extremity assistance;Patient percentage (comment) (Pt 40%) Squat Pivot Transfer Details (indicate cue type and reason): Attempted 1 person with RW to R-side, but pt yelled with pain and sat back to bed after partial stand. Hand-over-hand assist for proper placement for safety. +2 total assist needed for safety, but pt able to assist with RLE Ambulation/Gait Ambulation/Gait: No Stairs: No Wheelchair Mobility Wheelchair Mobility: No  Balance Balance Assessed: Yes Static Sitting Balance Static Sitting - Balance Support: Bilateral upper extremity supported;Feet supported Static Sitting - Level of Assistance: 3: Mod assist Static Sitting - Comment/# of Minutes: Mod A for maintaining anterior weight-shift and cues to place LLE flat on bed, but pt unable, maintaining hip flexion ~110 deg and knee 90 deg Dynamic Sitting Balance Dynamic Sitting - Balance Support: Bilateral upper extremity supported;Feet supported Dynamic Sitting - Level of Assistance: 2: Max assist Dynamic Sitting - Comments: Pt leaning posteriorly with weight shifts, not wanting to bring LLE onto bed    End of Session PT - End of Session Equipment Utilized During Treatment: Gait belt Activity Tolerance: Patient limited by pain Patient left: in chair;with call bell in reach Nurse Communication: Mobility status for transfers General Behavior During Session: [facility] for tasks performed Cognition: Impaired Cognitive Impairment: Decreased memory, problem solving, command following, and attention to task  Soundra Pilon, [place]  04/23/2011, 10:19 AM

## 2011-04-23 NOTE — Progress Notes (Signed)
PROGRESS NOTE  KOHEN SENFT [id number] DOB: 06/30/1970 DOA: 04/05/2011 PCP: Alonza Bogus, MD, MD Orthopedic surgeon: Meridee Score, M.D. Endocrinologist: [name], MD  Brief narrative: 41 year old woman presented to the emergency department at [facility] 05/07/2011. She was admitted for uncontrolled type 1 diabetes mellitus, presumptive infection of left foot. She was started on antibiotics and refused skilled nursing facility placement. Cardiology was consulted for abnormal EKG and she underwent a nuclear stress test which showed no new area of ischemia. Approximately 8 days into her hospitalization she developed confusion. MRI suggested multiple embolic strokes. TEE was performed. Neurology consultation obtained with the recommendation that Plavix as well as Coumadin be continued. EEG was unrevealing. Partially 10 days and the patient's hospitalization ischemia of her foot worse in an orthopedic/neurosurgery consultation was obtained. She was noted to be anemic and given a blood transfusion. Gen. surgery recommended transfer to [facility]. Transfer was accomplished approximately 13 days into her hospitalization. Vascular surgery saw the patient and recommended below the knee amputation. Orthopedic consultation was obtained in amputation was performed. Rehabilitation is being pursued.  Chart review:  03/27/2011-03/31/2011 hospitalization: Angioedema of the face possibly related to losartan, gangrene of left toe  03/03/2011-03/04/2011 hospitalization: Gangrene left foot. Underwent infiltration of the fourth and fifth rays left foot.  02/11/2011-02/14/2011 hospitalization: Left lower extremity cellulitis  01/27/2011-02/04/2011 hospitalization: Left leg ischemia secondary to peripheral emboli, etiology unknown. Status post exploration of left popliteal artery with thrombectomy left superficial femoral artery, anterior tibial artery, peroneal artery, and posterior tibial artery  with patch angioplasty left popliteal artery using bovine pericardial patch.  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  March 28: Transesophageal echocardiogram: Left ventricular ejection fraction 55-60%. No evidence of vegetation. No evidence of thrombus. Atrial septal aneurysm, otherwise essentially normal. No source of embolism identified.  March 29: EEG: mild generalized slowing, and no epileptiform activity.  April 4: Left below the knee amputation  Antibiotics:  March 22-April 2: Ciprofloxacin  March 18-March 24, April 1-: Zosyn  March 18-: Vancomycin  Interim History: Chart reviewed in detail and summarized as above. This review and summary consumed greater than 60 minutes.  Subjective: Complains of pain in left leg.  Objective: Filed Vitals:   04/23/11 0254 04/23/11 0400 04/23/11 0514 04/23/11 0800  BP:   124/60   Pulse:   98   Temp:   98.4 F (36.9 C)   TempSrc:      Resp: 13 20 18 14   Height:      Weight:      SpO2: 100% 100% 100% 100%    Intake/Output Summary (Last 24 hours) at 04/23/11 1008 Last data filed at 04/22/11 2248  Gross per 24 hour  Intake    525 ml  Output      0 ml  Net    525 ml    Exam:   General:  Appears to be in pain.  Cardiovascular: Regular rate and rhythm. No murmur, rub, gallop.   Respiratory: Clear to auscultation bilaterally. No wheezes, rales, rhonchi. Normal respiratory effort.  Data Reviewed: Basic Metabolic Panel:  Lab [id number] 04/21/11 0615 04/20/11 2220 04/19/11 0401  NA 143 138 138 136  K 4.6 4.6 -- --  CL  116* 110 108 105  CO2 19 19 20 25   GLUCOSE 150* 414* 283* 98  BUN 8 9 7 7   CREATININE 1.26* 1.43* 1.34* 1.45*  CALCIUM 7.4* 6.9* 7.0* 7.8*  MG -- -- -- --  PHOS -- -- -- --   Liver Function Tests:  Lab 04/20/11 2220  AST 11  ALT 8  ALKPHOS 93  BILITOT <0.1*  PROT 4.6*  ALBUMIN 0.6*   CBC:  Lab 04/23/11 0455 04/20/11 2220 04/20/11 0515 04/19/11 0401  WBC 16.0* 16.8* 14.0* 13.2*  NEUTROABS -- -- -- 8.7*  HGB 8.9* 9.0* 9.7* 6.4*  HCT 27.1* 26.8* 29.2* 19.4*  MCV 92.2 88.7 89.8 91.9  PLT 739* 734* 871* 921*   CBG:  Lab 04/23/11 0623 04/23/11 0155 04/22/11 2121 04/22/11 2010 04/22/11 1551  GLUCAP 155* 169* 176* 158* 81    Recent Results (from the past 240 hour(s))  CULTURE, BLOOD (ROUTINE X 2)     Status: Normal   Collection Time   04/13/11  6:34 PM      Component Value Range Status Comment   Specimen Description BLOOD LEFT WRIST   Final    Special Requests     Final    Value: BOTTLES DRAWN AEROBIC AND ANAEROBIC AEB=9CC ANA=4CC   Culture NO GROWTH 7 DAYS   Final    Report Status 04/20/2011 FINAL   Final   SURGICAL PCR SCREEN     Status: Normal   Collection Time   04/22/11 12:40 AM      Component Value Range Status Comment   MRSA, PCR NEGATIVE  NEGATIVE  Final    Staphylococcus aureus NEGATIVE  NEGATIVE  Final      Studies:  Scheduled Meds:    . alteplase  2 mg Intracatheter Once  . clopidogrel  75 mg Oral Q breakfast  . enoxaparin (LOVENOX) injection  40 mg Subcutaneous Q24H  . feeding supplement  1 Container Oral TID WC  . fentaNYL      . HYDROmorphone      . insulin aspart  0-15 Units Subcutaneous TID WC  . insulin aspart  0-5 Units Subcutaneous QHS  . insulin glargine  10 Units Subcutaneous QHS  . morphine   Intravenous Q4H  . morphine      . mulitivitamin with minerals  1 tablet Oral Daily  . omega-3 acid ethyl esters  1 g Oral Daily  . pantoprazole  40 mg Oral Q1200  . piperacillin-tazobactam (ZOSYN)  IV  3.375 g Intravenous Q8H  . potassium  chloride SA  20 mEq Oral TID  . sertraline  50 mg Oral Daily  . simvastatin  20 mg Oral q1800  . vancomycin  750 mg Intravenous Q24H   Continuous Infusions:    . sodium chloride 125 mL/hr at 04/22/11 2114  . sodium chloride       Assessment/Plan: 1. Left foot ischemia: No convincing evidence of osteomyelitis by CT or MRI of the foot. I see no compelling reason to continue antibiotics now, especially in light of the patient's amputation.  Continue pain control. Physical therapy. 2. Diabetes mellitus type I, uncontrolled hemoglobin A1c 7.1: Stable as an inpatient. Lantus. Sliding-scale insulin. 3. Acute renal failure: Improving with IV fluids. Repeat basic metabolic panel morning. 4. Anemia: Stable status post transfusion. Continue to follow. 5. Stroke, embolic: MRI earlier in hospitalization showed multiple acute infarcts in different vascular distributions on the right suggestive of embolic phenomena. MRA did reveal  moderate stenosis of both carotids and the cavernous portion of the vessel. Transesophageal echocardiogram was unrevealing. Consult neurology for recommendations specific to secondary risk reduction. Plavix for 3 months per previous neurology consultation. Suspect patient with need warfarin given thrombolic stroke as well as her embolic phenomenon resulting in left lower extremity ischemia. 6. 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 7. Cocaine abuse 8. Cigarette smoker   Code Status: Full code Family Communication: None bedside Disposition Plan: Pending further treatment.   Murray Hodgkins, MD  [facility] Pager [phone] 04/23/2011, 10:08 AM    LOS: 18 days

## 2011-04-23 NOTE — Progress Notes (Signed)
Pt CBG at 1800 is 34. One amp of D50 given at 1810. Pt alert and able to drink entire box of resource. Will continue to monitor CBG.

## 2011-04-23 NOTE — Consult Note (Signed)
[facility] CONSULT NOTE     Reason for Consult: recommendations for anticoagulation in patient with stroke    HPI:    [name] is an 41 y.o. 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 Status post exploration of left popliteal artery with thrombectomy left superficial femoral artery, anterior tibial artery, peroneal artery, and posterior tibial artery, Clot in distal AO requiring thrombectomy and chronic coumadin.  Recent admission to [facility] 04-06-2011 for uncontrolled DM and infection of left foot.   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   Both TEE and 2 D echo showed no source of emboli. She was placed on both coumadin and plavix in which she was to remain on plavix for three months. Due to worsening foot infection she was transferred to [facility]. 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  At time of consultation patient is very labile and would not answer any of my questions.   Neurology has been asked for recommendations on anticoagulation. At present time she is on Plavix and Lovenox and coumadin per pharmacy.   Past Medical History  Diagnosis Date  . Shingles     11/2010  . Cellulitis     of face - 12/2010  . Peripheral vascular disease in diabetes mellitus   . Hyperlipidemia   . Tobacco abuse     0.5 - 1ppd x 21 yrs  . Headache   . Renal disorder     glomerulonephritis  . Blood transfusion   . Anxiety   . Depression   . Peripheral vascular disease   . Diabetes mellitus     IDDM Since Age 5  . Embolism - blood clot   . Renal insufficiency     Past Surgical History  Procedure Date  . Wrist surgery     left  . Embolectomy 01/29/2011    Procedure: EMBOLECTOMY;  Surgeon: Mal Misty, MD;  Location: [facility] OR;  Service:  Vascular;  Laterality: Left;  Left Popliteal and Tibial Embolectomy with patch angioplasty  . Dilation and curettage of uterus   . Multiple tooth extractions   . Amputation 03/03/2011    Procedure: AMPUTATION DIGIT;  Surgeon: Newt Minion, MD;  Location: [place];  Service: Orthopedics;  Laterality: Left;  Left Foot Amputation 4th and 5th toes at MTP joint  . Tee without cardioversion 04/15/2011    Procedure: TRANSESOPHAGEAL ECHOCARDIOGRAM (TEE);  Surgeon: [name], MD;  [facility];  Service: Cardiovascular;  Laterality: N/A;    Family History  Problem Relation Age of Onset  . COPD Mother     alive - 87  . Hypertension Mother   . Diabetes Mother   . Stroke Mother   . Coronary artery disease Mother   . Diabetes Father     alive - 57  . Hypertension Father   . Coronary artery disease Father   . Anesthesia problems Neg Hx     Social History:  reports that she has been passively smoking Cigarettes.  She has a 21 pack-year smoking history. She has never used smokeless tobacco. She reports that she uses illicit drugs (Marijuana) about once per week. She reports that she does not drink alcohol.  Allergies  Allergen Reactions  . Aspirin Other (See Comments)    Cannot take this medication due to kidney disease  . Ibuprofen Other (See Comments)    Kidney disease  . Losartan Swelling    Medications:    Prior to Admission:  Prescriptions prior to admission  Medication Sig Dispense Refill  . acetaminophen (TYLENOL) 500 MG tablet Take 1,000 mg by mouth every 6 (six) hours as needed. For pain       . ALPRAZolam (XANAX) 1 MG tablet Take 1 mg by mouth daily as needed. For anxiety      . amLODipine (NORVASC) 2.5 MG tablet Take 1 tablet (2.5 mg total) by mouth daily.  30 tablet  5  . cephALEXin (KEFLEX) 500 MG capsule Take 500 mg by mouth 3 (three) times daily.      . diphenhydrAMINE (BENADRYL) 25 MG tablet Take 25 mg by mouth at bedtime as needed. For sleep      . fish  oil-omega-3 fatty acids 1000 MG capsule Take 1 g by mouth daily.      . furosemide (LASIX) 20 MG tablet Take 20 mg by mouth 3 (three) times a week. On Mondays, Wednesdays, and Fridays      . insulin aspart (NOVOLOG) 100 UNIT/ML injection Inject 5 Units into the skin 3 (three) times daily before meals.  1 vial  12  . insulin glargine (LANTUS) 100 UNIT/ML injection Inject 15 Units into the skin at bedtime.  10 mL  12  . Multiple Vitamins-Minerals (MULTIVITAMINS THER. W/MINERALS) TABS Take 1 tablet by mouth daily.        . ondansetron (ZOFRAN-ODT) 8 MG disintegrating tablet Take 8 mg by mouth every 12 (twelve) hours as needed. Nausea      . oxyCODONE-acetaminophen (PERCOCET) 10-325 MG per tablet Take 1 tablet by mouth every 6 (six) hours as needed for pain.  30 tablet  0  . potassium chloride SA (K-DUR,KLOR-CON) 20 MEQ tablet Take 0.5 tablets (10 mEq total) by mouth daily.  7 tablet  0  . pravastatin (PRAVACHOL) 40 MG tablet Take 1 tablet (40 mg total) by mouth daily.  30 tablet  5  . sertraline (ZOLOFT) 50 MG tablet Take 50 mg by mouth daily.       Marland Kitchen warfarin (COUMADIN) 3 MG tablet Take 3 mg by mouth daily.       Scheduled:   . clopidogrel  75 mg Oral Q breakfast  . enoxaparin (LOVENOX) injection  40 mg Subcutaneous Q24H  . feeding supplement  1 Container Oral TID WC  . fentaNYL  12.5 mcg Transdermal Q72H  . fentaNYL      . HYDROmorphone      . insulin aspart  0-15 Units Subcutaneous TID WC  . insulin aspart  0-5 Units Subcutaneous QHS  . insulin glargine  10 Units Subcutaneous QHS  . morphine   Intravenous Q4H  . morphine      . mulitivitamin with minerals  1 tablet Oral Daily  . omega-3 acid ethyl esters  1 g Oral Daily  . pantoprazole  40 mg Oral Q1200  . potassium chloride SA  20 mEq Oral TID  . sertraline  50 mg Oral Daily  . simvastatin  20 mg Oral q1800  . warfarin  3 mg Oral ONCE-1800  . Warfarin - Pharmacist Dosing Inpatient   Does not apply q1800  . DISCONTD:  piperacillin-tazobactam (ZOSYN)  IV  3.375 g Intravenous Q8H  . DISCONTD: vancomycin  750 mg Intravenous  Q24H  . DISCONTD: vancomycin  750 mg Intravenous Q12H    Review of Systems - General ROS: negative for - chills, fatigue, fever or hot flashes Hematological and Lymphatic ROS: negative for - bruising, fatigue, jaundice or pallor Endocrine ROS: negative for - hair pattern changes, hot flashes, mood swings or skin changes Respiratory ROS: negative for - cough, hemoptysis, orthopnea or wheezing Cardiovascular ROS: negative for - dyspnea on exertion, orthopnea, palpitations or shortness of breath Gastrointestinal ROS: negative for - abdominal pain, appetite loss, blood in stools, diarrhea or hematemesis Musculoskeletal ROS: negative for - joint pain, joint stiffness, joint swelling or muscle pain Neurological ROS: See HPI Dermatological ROS: negative for dry skin, pruritus and rash   Blood pressure 124/60, pulse 98, temperature 98.4 F (36.9 C), temperature source Oral, resp. rate 18, height 5\' 1"  (1.549 m), weight 68.312 kg (150 lb 9.6 oz), last menstrual period 12/28/2010, SpO2 100.00%.   Neurologic Examination:   Mental Status: Alert, oriented, very labile fluctuating between extreme crying and anger.  Speech fluent without evidence of aphasia. Able to follow 3 step commands without difficulty. Cranial Nerves: II-Visual fields grossly intact. III/IV/VI-Extraocular movements intact.  Pupils reactive bilaterally. V/VII-Smile symmetric VIII-grossly intact IX/X-normal gag XI-bilateral shoulder shrug XII-midline tongue extension Motor: 5/5 bilaterally with normal tone and bulk with a left BKA Sensory: Pinprick and light touch intact throughout, bilaterally Deep Tendon Reflexes: 2+ and symmetric throughout (did not asses left KJ as patient would not let me touch her leg) Plantars: Downgoing on right Cerebellar: Normal finger-to-nose, normal rapid alternating movements and normal  heel-to-shin test on right.     Lab Results  Component Value Date/Time   CHOL  Value: 228        ATP III CLASSIFICATION:  <200     mg/dL   Desirable  200-239  mg/dL   Borderline High  >=240    mg/dL   High       * 03/30/2009  5:47 AM    Results for orders placed during the hospital encounter of 04/05/11 (from the past 48 hour(s))  GLUCOSE, CAPILLARY     Status: Abnormal   Collection Time   04/21/11  4:26 PM      Component Value Range Comment   Glucose-Capillary 63 (*) 70 - 99 (mg/dL)    Comment 1 Notify RN     GLUCOSE, CAPILLARY     Status: Normal   Collection Time   04/21/11  5:25 PM      Component Value Range Comment   Glucose-Capillary 91  70 - 99 (mg/dL)   GLUCOSE, CAPILLARY     Status: Abnormal   Collection Time   04/21/11  8:54 PM      Component Value Range Comment   Glucose-Capillary 53 (*) 70 - 99 (mg/dL)   GLUCOSE, CAPILLARY     Status: Abnormal   Collection Time   04/21/11  9:16 PM      Component Value Range Comment   Glucose-Capillary 47 (*) 70 - 99 (mg/dL)   GLUCOSE, CAPILLARY     Status: Abnormal   Collection Time   04/21/11  9:37 PM      Component Value Range Comment   Glucose-Capillary 51 (*) 70 - 99 (mg/dL)   GLUCOSE, CAPILLARY     Status: Abnormal   Collection Time   04/21/11 10:04 PM      Component Value Range Comment   Glucose-Capillary 52 (*) 70 - 99 (mg/dL)   GLUCOSE, CAPILLARY     Status:  Abnormal   Collection Time   04/21/11 10:30 PM      Component Value Range Comment   Glucose-Capillary 66 (*) 70 - 99 (mg/dL)   GLUCOSE, CAPILLARY     Status: Abnormal   Collection Time   04/21/11 11:47 PM      Component Value Range Comment   Glucose-Capillary 133 (*) 70 - 99 (mg/dL)   SURGICAL PCR SCREEN     Status: Normal   Collection Time   04/22/11 12:40 AM      Component Value Range Comment   MRSA, PCR NEGATIVE  NEGATIVE     Staphylococcus aureus NEGATIVE  NEGATIVE    GLUCOSE, CAPILLARY     Status: Abnormal   Collection Time   04/22/11  4:09 AM      Component Value  Range Comment   Glucose-Capillary 138 (*) 70 - 99 (mg/dL)   GLUCOSE, CAPILLARY     Status: Abnormal   Collection Time   04/22/11  9:14 AM      Component Value Range Comment   Glucose-Capillary 127 (*) 70 - 99 (mg/dL)   PROTIME-INR     Status: Abnormal   Collection Time   04/22/11 10:40 AM      Component Value Range Comment   Prothrombin Time 18.6 (*) 11.6 - 15.2 (seconds)    INR 1.52 (*) 0.00 - 1.49    GLUCOSE, CAPILLARY     Status: Abnormal   Collection Time   04/22/11 11:48 AM      Component Value Range Comment   Glucose-Capillary 171 (*) 70 - 99 (mg/dL)    Comment 1 Notify RN     GLUCOSE, CAPILLARY     Status: Normal   Collection Time   04/22/11  3:51 PM      Component Value Range Comment   Glucose-Capillary 81  70 - 99 (mg/dL)    Comment 1 Documented in Chart      Comment 2 Notify RN     GLUCOSE, CAPILLARY     Status: Abnormal   Collection Time   04/22/11  8:10 PM      Component Value Range Comment   Glucose-Capillary 158 (*) 70 - 99 (mg/dL)    Comment 1 Documented in Chart      Comment 2 Notify RN     GLUCOSE, CAPILLARY     Status: Abnormal   Collection Time   04/22/11  9:21 PM      Component Value Range Comment   Glucose-Capillary 176 (*) 70 - 99 (mg/dL)    Comment 1 Documented in Chart      Comment 2 Notify RN     GLUCOSE, CAPILLARY     Status: Abnormal   Collection Time   04/23/11  1:55 AM      Component Value Range Comment   Glucose-Capillary 169 (*) 70 - 99 (mg/dL)    Comment 1 Documented in Chart      Comment 2 Notify RN     BASIC METABOLIC PANEL     Status: Abnormal   Collection Time   04/23/11  4:55 AM      Component Value Range Comment   Sodium 143  135 - 145 (mEq/L)    Potassium 4.6  3.5 - 5.1 (mEq/L)    Chloride 116 (*) 96 - 112 (mEq/L)    CO2 19  19 - 32 (mEq/L)    Glucose, Bld 150 (*) 70 - 99 (mg/dL)    BUN 8  6 - 23 (mg/dL)  Creatinine, Ser 1.26 (*) 0.50 - 1.10 (mg/dL)    Calcium 7.4 (*) 8.4 - 10.5 (mg/dL)    GFR calc non Af Amer 52 (*) >90 (mL/min)     GFR calc Af Amer 60 (*) >90 (mL/min)   PROTIME-INR     Status: Abnormal   Collection Time   04/23/11  4:55 AM      Component Value Range Comment   Prothrombin Time 20.6 (*) 11.6 - 15.2 (seconds)    INR 1.73 (*) 0.00 - 1.49    CBC     Status: Abnormal   Collection Time   04/23/11  4:55 AM      Component Value Range Comment   WBC 16.0 (*) 4.0 - 10.5 (K/uL)    RBC 2.94 (*) 3.87 - 5.11 (MIL/uL)    Hemoglobin 8.9 (*) 12.0 - 15.0 (g/dL)    HCT 27.1 (*) 36.0 - 46.0 (%)    MCV 92.2  78.0 - 100.0 (fL)    MCH 30.3  26.0 - 34.0 (pg)    MCHC 32.8  30.0 - 36.0 (g/dL)    RDW 15.6 (*) 11.5 - 15.5 (%)    Platelets 739 (*) 150 - 400 (K/uL)   GLUCOSE, CAPILLARY     Status: Abnormal   Collection Time   04/23/11  6:23 AM      Component Value Range Comment   Glucose-Capillary 155 (*) 70 - 99 (mg/dL)    Comment 1 Documented in Chart      Comment 2 Notify RN     Danise Mina, [place]     Status: Normal   Collection Time   04/23/11 10:00 AM      Component Value Range Comment   Vancomycin Tr 11.2  10.0 - 20.0 (ug/mL)   GLUCOSE, CAPILLARY     Status: Normal   Collection Time   04/23/11 10:27 AM      Component Value Range Comment   Glucose-Capillary 70  70 - 99 (mg/dL)    Comment 1 Notify RN      Comment 2 Documented in Chart       Dg Chest 2 View  04/21/2011  *RADIOLOGY REPORT*  Clinical Data: Shortness of breath.  Preop for for the patient.  CHEST - 2 VIEW  Comparison: Chest x-ray 01/30/2011.  Findings: The right PICC line is in good position with the tip in the distal SVC.  The cardiac silhouette, mediastinal and hilar contours are stable.  The lungs are clear. There are small pleural effusions.  The bony thorax is intact.  IMPRESSION:  1.  Right PICC line tip in good position. 2.  Small bilateral pleural effusions.  Original Report Authenticated By: P. Kalman Jewels, M.D.     Assessment/Plan:   [name] with recent embolic infarct while on coumadin at therapeutic levels. TTE and TEE show no source of  emboli. Patient was seen initially at [facility] and recommendations at that time were to remain on coumadin and add Plavix for 3 moths.  Neurology was asked for further recommendations on anticoagulation.   Recommendations: 1) Stop Plavix 2) Start 81 mg ASA 3) Continue coumadin per pharmacy with Lovenox as bridge.    Etta Quill PA-C [facility] [phone]  04/23/2011, 2:34 PM

## 2011-04-23 NOTE — Progress Notes (Signed)
Patient ID: [name], female   DOB: 29-Sep-1970, 41 y.o.   MRN: [id number] Patient is postoperative day 1 left transtibial amputation. Patient is still in pain but she states her pain is diminished. Patient will need short-term skilled nursing for discharge planning. I will followup in the office 2 weeks after discharge.

## 2011-04-24 LAB — CBC
HCT: 25.2 % — ABNORMAL LOW (ref 36.0–46.0)
Hemoglobin: 8.3 g/dL — ABNORMAL LOW (ref 12.0–15.0)
MCH: 29.9 pg (ref 26.0–34.0)
MCHC: 32.9 g/dL (ref 30.0–36.0)
MCV: 90.6 fL (ref 78.0–100.0)
Platelets: 675 10*3/uL — ABNORMAL HIGH (ref 150–400)
RBC: 2.78 MIL/uL — ABNORMAL LOW (ref 3.87–5.11)
RDW: 15.5 % (ref 11.5–15.5)
WBC: 16.7 10*3/uL — ABNORMAL HIGH (ref 4.0–10.5)

## 2011-04-24 LAB — HOMOCYSTEINE: Homocysteine: <1 umol/L — ABNORMAL LOW (ref 4.0–15.4)

## 2011-04-24 LAB — GLUCOSE, CAPILLARY
Glucose-Capillary: 187 mg/dL — ABNORMAL HIGH (ref 70–99)
Glucose-Capillary: 229 mg/dL — ABNORMAL HIGH (ref 70–99)
Glucose-Capillary: 227 mg/dL — ABNORMAL HIGH (ref 70–99)
Glucose-Capillary: 101 mg/dL — ABNORMAL HIGH (ref 70–99)
Glucose-Capillary: 97 mg/dL (ref 70–99)

## 2011-04-24 LAB — PROTIME-INR
INR: 1.77 — ABNORMAL HIGH (ref 0.00–1.49)
Prothrombin Time: 20.9 seconds — ABNORMAL HIGH (ref 11.6–15.2)

## 2011-04-24 MED ORDER — INSULIN GLARGINE 100 UNIT/ML ~~LOC~~ SOLN
5.0000 [IU] | Freq: Every day | SUBCUTANEOUS | Status: DC
  Administered 2011-04-25 – 2011-04-27 (×3): 5 [IU] via SUBCUTANEOUS

## 2011-04-24 MED ORDER — WARFARIN SODIUM 3 MG PO TABS
3.0000 mg | ORAL_TABLET | Freq: Once | ORAL | Status: AC
  Administered 2011-04-24: 3 mg via ORAL
  Filled 2011-04-24: qty 1

## 2011-04-24 MED ORDER — ENOXAPARIN SODIUM 80 MG/0.8ML ~~LOC~~ SOLN
70.0000 mg | Freq: Two times a day (BID) | SUBCUTANEOUS | Status: DC
  Filled 2011-04-24 (×2): qty 0.8

## 2011-04-24 MED ORDER — ENOXAPARIN SODIUM 100 MG/ML ~~LOC~~ SOLN
100.0000 mg | Freq: Every day | SUBCUTANEOUS | Status: DC
  Administered 2011-04-24 – 2011-04-26 (×3): 100 mg via SUBCUTANEOUS
  Filled 2011-04-24 (×4): qty 1

## 2011-04-24 MED ORDER — MORPHINE SULFATE (PF) 1 MG/ML IV SOLN
INTRAVENOUS | Status: AC
  Filled 2011-04-24: qty 25

## 2011-04-24 NOTE — Progress Notes (Addendum)
ANTICOAGULATION CONSULT NOTE - Initial Consult  Pharmacy Consult:  Lovenox / Coumadin Indication:  Embolic strokes  Allergies  Allergen Reactions  . Aspirin Other (See Comments)    Cannot take this medication due to kidney disease  . Ibuprofen Other (See Comments)    Kidney disease  . Losartan Swelling    Patient Measurements: Height: 5\' 1"  (154.9 cm) Weight: 150 lb 9.6 oz (68.312 kg) IBW/kg (Calculated) : 47.8   Vital Signs: Temp: 97.9 F (36.6 C) (04/06 0544) BP: 118/59 mmHg (04/06 0544) Pulse Rate: 98  (04/06 0544)  Labs:  Basename 04/24/11 0544 04/23/11 0455 04/22/11 1040  HGB 8.3* 8.9* --  HCT 25.2* 27.1* --  PLT 675* 739* --  APTT -- -- --  LABPROT 20.9* 20.6* 18.6*  INR 1.77* 1.73* 1.52*  HEPARINUNFRC -- -- --  CREATININE -- 1.26* --  CKTOTAL -- -- --  CKMB -- -- --  TROPONINI -- -- --   Estimated Creatinine Clearance: 51.9 ml/min (by C-G formula based on Cr of 1.26).  Medical History: Past Medical History  Diagnosis Date  . Shingles     11/2010  . Cellulitis     of face - 12/2010  . Peripheral vascular disease in diabetes mellitus   . Hyperlipidemia   . Tobacco abuse     0.5 - 1ppd x 21 yrs  . Headache   . Renal disorder     glomerulonephritis  . Blood transfusion   . Anxiety   . Depression   . Peripheral vascular disease   . Diabetes mellitus     IDDM Since Age 47  . Embolism - blood clot   . Renal insufficiency      Assessment: 41 YOF with h/o thromboembolic occlusion of LLE and new CVA's to continue on Coumadin and change Lovenox to full-dose therapy.  INR remains subtherapeutic, no bleeding noted.  Patient's renal function appropriate for BID dosing of Lovenox.   Goal of Therapy:  INR 2.5 - 3 per MD    Plan:  - Lovenox 100mg  SQ daily until INR therapeutic (1.5mg /kg/dose) - Coumadin 3mg  PO today - Daily PT/INR, CBC Q72H while on Lovenox - Monitor renal fxn, CBGs    [name] D. Mina Marble, PharmD, BCPS Pager:  [phone] 04/24/2011,  11:01 AM

## 2011-04-24 NOTE — Progress Notes (Signed)
Subjective: Patient is complaining of pain involving her left thigh and knee. She has PCA analgesic medication available.  Objective: Current vital signs: BP 118/59  Pulse 98  Temp(Src) 97.9 F (36.6 C) (Oral)  Resp 20  Ht 5\' 1"  (1.549 m)  Wt 68.312 kg (150 lb 9.6 oz)  BMI 28.46 kg/m2  SpO2 99%  LMP 12/28/2010  Neurologic Exam: Patient is alert and confused. She appears to be experiencing visual hallucinations. She is moving all extremities well without difficulty.  Lab Results: INR today is 1.77.  Medications:  Scheduled:   . dextrose      . enoxaparin (LOVENOX) injection  40 mg Subcutaneous Q24H  . feeding supplement  1 Container Oral TID WC  . fentaNYL  12.5 mcg Transdermal Q72H  . insulin aspart  0-15 Units Subcutaneous TID WC  . insulin aspart  0-5 Units Subcutaneous QHS  . insulin glargine  10 Units Subcutaneous QHS  . morphine   Intravenous Q4H  . morphine      . mulitivitamin with minerals  1 tablet Oral Daily  . omega-3 acid ethyl esters  1 g Oral Daily  . pantoprazole  40 mg Oral Q1200  . potassium chloride SA  20 mEq Oral TID  . sertraline  50 mg Oral Daily  . simvastatin  20 mg Oral q1800  . warfarin  3 mg Oral ONCE-1800  . Warfarin - Pharmacist Dosing Inpatient   Does not apply q1800  . DISCONTD: aspirin EC  81 mg Oral Daily  . DISCONTD: clopidogrel  75 mg Oral Q breakfast  . DISCONTD: piperacillin-tazobactam (ZOSYN)  IV  3.375 g Intravenous Q8H  . DISCONTD: vancomycin  750 mg Intravenous Q24H  . DISCONTD: vancomycin  750 mg Intravenous Q12H    Assessment/Plan: Multiple small acute ischemic cerebral infarctions involving multiple cerebrovascular territories, consistent with emboli from a proximal source, likely cardiac. No source was determined however with transesophageal echocardiogram.  Recommendations: 1. Continue anticoagulation bridge with therapeutic Lovenox until INR has reached desired range. 2. Would recommend INR range of 2.5-3.0. 3.  Cannot add aspirin, as patient is allergic to aspirin. 4. Would not add Plavix to Coumadin, as risk of bleeding likely outweighs any potential stroke prevention.  C.R. Nicole Kindred, MD [facility]  04/24/2011  8:35 AM

## 2011-04-24 NOTE — Progress Notes (Signed)
PROGRESS NOTE  [name] [id number] DOB: May 22, 1970 DOA: 04/05/2011 PCP: Alonza Bogus, MD, MD Orthopedic surgeon: Meridee Score, M.D. Endocrinologist: [name], MD  Assessment/Plan: 1. Left foot ischemia: Status post amputation. No convincing evidence of osteomyelitis by CT or MRI of the foot. Antibiotics discontinued. Leukocytosis has been persistent since December 2012. Continue pain control. Physical therapy. 2. Diabetes mellitus type I, uncontrolled hemoglobin A1c 7.1: Brittle. One episode of hypoglycemia. Decrease Lantus. Sliding-scale insulin. May need to change sine skilled Z. sensitive. 3. Acute renal failure: Improving.  4. Anemia: Stable status post transfusion.  5. Stroke, embolic: Warfarin with therapeutic Lovenox bridge. Goal INR 2.5-3. No aspirin secondary to allergy. No Plavix as bleeding risk likely outweighs stroke prevention. MRI earlier in hospitalization showed multiple acute infarcts in different vascular distributions on the right suggestive of embolic phenomena. MRA did reveal moderate stenosis of both carotids and the cavernous portion of the vessel. Transesophageal echocardiogram was unrevealing. 6. 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 7. Cocaine abuse 8. Cigarette smoker   Code Status: Full code Family Communication: None bedside Disposition Plan: Pending further treatment.  Brief narrative: 41 year old woman presented to the emergency department at [facility] 05/07/2011. She was admitted for uncontrolled type 1 diabetes mellitus, presumptive infection of left foot. She was started on antibiotics and refused skilled nursing facility placement. Cardiology was consulted for abnormal EKG and she underwent a nuclear stress test which showed no new area of ischemia.  Approximately 8 days into her hospitalization she developed confusion. MRI suggested multiple embolic strokes. TEE was performed. Neurology consultation obtained with the recommendation that Plavix as well as Coumadin be continued. EEG was unrevealing. Partially 10 days and the patient's hospitalization ischemia of her foot worse in an orthopedic/neurosurgery consultation was obtained. She was noted to be anemic and given a blood transfusion. Gen. surgery recommended transfer to [facility]. Transfer was accomplished approximately 13 days into her hospitalization. Vascular surgery saw the patient and recommended below the knee amputation. Orthopedic consultation was obtained in amputation was performed. Rehabilitation is being pursued.  Chart review:  03/27/2011-03/31/2011 hospitalization: Angioedema of the face possibly related to losartan, gangrene of left toe  03/03/2011-03/04/2011 hospitalization: Gangrene left foot. Underwent infiltration of the fourth and fifth rays left foot.  02/11/2011-02/14/2011 hospitalization: Left lower extremity cellulitis  01/27/2011-02/04/2011 hospitalization: Left leg ischemia secondary to peripheral emboli, etiology unknown. Status post exploration of left popliteal artery with thrombectomy left superficial femoral artery, anterior tibial artery, peroneal artery, and posterior tibial artery with patch angioplasty left popliteal artery using bovine pericardial patch.  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  March 28:  Transesophageal echocardiogram: Left ventricular ejection fraction 55-60%. No evidence of vegetation. No evidence of thrombus. Atrial septal aneurysm, otherwise essentially normal. No source of embolism identified.  March 29: EEG: mild generalized slowing, and no epileptiform activity.  April 4: Left below the knee amputation  Antibiotics:  March 22-April 2: Ciprofloxacin  March 18-March 24, April 1-: Zosyn  March 18-: Vancomycin  Interim History: Interval  documentation reviewed. Neurology recommended discontinuing Plavix. Continue warfarin. One episode of hypoglycemia.  Subjective: Complains of pain in left leg.  Objective: Filed Vitals:   04/24/11 0014 04/24/11 0424 04/24/11 0544 04/24/11 0932  BP:   118/59   Pulse:   98   Temp:   97.9 F (36.6 C)   TempSrc:      Resp: 16 16 20 20   Height:      Weight:      SpO2: 100% 100% 99% 98%    Intake/Output Summary (Last 24 hours) at 04/24/11 0955 Last data filed at 04/23/11 1600  Gross per 24 hour  Intake    810 ml  Output      0 ml  Net    810 ml    Exam:   General:  Appears to be in pain.  Cardiovascular: Regular rate and rhythm. No murmur, rub, gallop.   Respiratory: Clear to auscultation bilaterally. No wheezes, rales, rhonchi. Normal respiratory effort.  Data Reviewed: Basic Metabolic Panel:  Lab [id number] 04/21/11 0615 04/20/11 2220 04/19/11 0401  NA 143 138 138 136  K 4.6 4.6 -- --  CL 116* 110 108 105  CO2 19 19 20 25   GLUCOSE 150* 414* 283* 98  BUN 8 9 7 7   CREATININE 1.26* 1.43* 1.34* 1.45*  CALCIUM 7.4* 6.9* 7.0* 7.8*  MG -- -- -- --  PHOS -- -- -- --   Liver Function Tests:  Lab 04/20/11 2220  AST 11  ALT 8  ALKPHOS 93  BILITOT <0.1*  PROT 4.6*  ALBUMIN 0.6*   CBC:  Lab 04/24/11 0544 04/23/11 0455 04/20/11 2220 04/20/11 0515 04/19/11 0401  WBC 16.7* 16.0* 16.8* 14.0* 13.2*  NEUTROABS -- -- -- -- 8.7*  HGB 8.3* 8.9* 9.0* 9.7* 6.4*  HCT 25.2* 27.1* 26.8* 29.2* 19.4*  MCV 90.6  92.2 88.7 89.8 91.9  PLT 675* 739* 734* 871* 921*   CBG:  Lab 04/24/11 0630 04/24/11 0400 04/23/11 2342 04/23/11 1845 04/23/11 1759  GLUCAP 229* 187* 127* 91 34*    Recent Results (from the past 240 hour(s))  SURGICAL PCR SCREEN     Status: Normal   Collection Time   04/22/11 12:40 AM      Component Value Range Status Comment   MRSA, PCR NEGATIVE  NEGATIVE  Final    Staphylococcus aureus NEGATIVE  NEGATIVE  Final      Studies:  Scheduled Meds:    . dextrose      . enoxaparin (LOVENOX) injection  40 mg Subcutaneous Q24H  . feeding supplement  1 Container Oral TID WC  . fentaNYL  12.5 mcg Transdermal Q72H  . insulin aspart  0-15 Units Subcutaneous TID WC  . insulin aspart  0-5 Units Subcutaneous QHS  . insulin glargine  10 Units Subcutaneous QHS  . morphine   Intravenous Q4H  . morphine      . mulitivitamin with minerals  1 tablet Oral Daily  . omega-3 acid ethyl esters  1 g Oral Daily  . pantoprazole  40 mg Oral Q1200  . potassium chloride SA  20 mEq Oral TID  . sertraline  50 mg Oral Daily  . simvastatin  20 mg Oral q1800  . warfarin  3 mg Oral ONCE-1800  . Warfarin - Pharmacist Dosing Inpatient   Does not apply q1800  . DISCONTD: aspirin EC  81 mg Oral Daily  . DISCONTD: clopidogrel  75 mg Oral Q breakfast  . DISCONTD: piperacillin-tazobactam (ZOSYN)  IV  3.375 g Intravenous Q8H  . DISCONTD: vancomycin  750 mg Intravenous Q24H  . DISCONTD: vancomycin  750 mg Intravenous Q12H   Continuous Infusions:    . sodium chloride    . DISCONTD: sodium chloride 125 mL/hr at 04/22/11 2114        Murray Hodgkins, MD  [facility] Pager [phone] 04/24/2011, 9:55 AM    LOS: 19 days

## 2011-04-25 LAB — PROTIME-INR
INR: 2.02 — ABNORMAL HIGH (ref 0.00–1.49)
Prothrombin Time: 23.2 seconds — ABNORMAL HIGH (ref 11.6–15.2)

## 2011-04-25 LAB — GLUCOSE, CAPILLARY
Glucose-Capillary: 216 mg/dL — ABNORMAL HIGH (ref 70–99)
Glucose-Capillary: 159 mg/dL — ABNORMAL HIGH (ref 70–99)
Glucose-Capillary: 120 mg/dL — ABNORMAL HIGH (ref 70–99)
Glucose-Capillary: 171 mg/dL — ABNORMAL HIGH (ref 70–99)

## 2011-04-25 MED ORDER — MORPHINE SULFATE (PF) 1 MG/ML IV SOLN
INTRAVENOUS | Status: AC
  Filled 2011-04-25: qty 25

## 2011-04-25 MED ORDER — MORPHINE SULFATE (PF) 1 MG/ML IV SOLN
INTRAVENOUS | Status: AC
  Administered 2011-04-25: 01:00:00
  Filled 2011-04-25: qty 25

## 2011-04-25 MED ORDER — WARFARIN SODIUM 3 MG PO TABS
3.0000 mg | ORAL_TABLET | Freq: Once | ORAL | Status: AC
  Administered 2011-04-25: 3 mg via ORAL
  Filled 2011-04-25: qty 1

## 2011-04-25 NOTE — Progress Notes (Signed)
PROGRESS NOTE  [name] [id number] DOB: 07/15/1970 DOA: 04/05/2011 PCP: Alonza Bogus, MD, MD Orthopedic surgeon: Meridee Score, M.D. Endocrinologist: [name], MD  Assessment/Plan: 1. Left foot ischemia: Status post amputation. No convincing evidence of osteomyelitis by CT or MRI of the foot. Antibiotics discontinued. Leukocytosis has been persistent since December 2012. Continue pain control. Physical therapy. 2. Diabetes mellitus type I, uncontrolled hemoglobin A1c 7.1: Stable. No hypoglycemia last 24 hours. Continue Lantus. Sliding-scale insulin.  3. Acute renal failure: Improving. Recheck basic metabolic panel in the morning. 4. Anemia: Slightly lower today. Recheck in the morning. Has recently been transfused so anemia panel not likely to be of use. Likely postoperative in nature but check stool for occult blood. Continue warfarin for now. 5. Stroke, embolic: Warfarin with therapeutic Lovenox bridge. Goal INR 2.5-3. No aspirin secondary to allergy. No Plavix as bleeding risk likely outweighs stroke prevention. MRI earlier in hospitalization showed multiple acute infarcts in different vascular distributions on the right suggestive of embolic phenomena. MRA did reveal moderate stenosis of both carotids and the cavernous portion of the vessel. Transesophageal echocardiogram was unrevealing. 6. 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 7. Cocaine abuse 8. Cigarette smoker   Code Status: Full code Family Communication: None bedside Disposition Plan: Inpatient rehabilitation when stable.  Brief narrative: 41 year old woman presented to the emergency department at [facility] 05/07/2011. She was admitted for uncontrolled type 1 diabetes mellitus, presumptive infection of left foot. She was started on  antibiotics and refused skilled nursing facility placement. Cardiology was consulted for abnormal EKG and she underwent a nuclear stress test which showed no new area of ischemia. Approximately 8 days into her hospitalization she developed confusion. MRI suggested multiple embolic strokes. TEE was performed. Neurology consultation obtained with the recommendation that Plavix as well as Coumadin be continued. EEG was unrevealing. Partially 10 days and the patient's hospitalization ischemia of her foot worse in an orthopedic/neurosurgery consultation was obtained. She was noted to be anemic and given a blood transfusion. Gen. surgery recommended transfer to [facility]. Transfer was accomplished approximately 13 days into her hospitalization. Vascular surgery saw the patient and recommended below the knee amputation. Orthopedic consultation was obtained in amputation was performed. Rehabilitation is being pursued.  Chart review:  03/27/2011-03/31/2011 hospitalization: Angioedema of the face possibly related to losartan, gangrene of left toe  03/03/2011-03/04/2011 hospitalization: Gangrene left foot. Underwent infiltration of the fourth and fifth rays left foot.  02/11/2011-02/14/2011 hospitalization: Left lower extremity cellulitis  01/27/2011-02/04/2011 hospitalization: Left leg ischemia secondary to peripheral emboli, etiology unknown. Status post exploration of left popliteal artery with thrombectomy left superficial femoral artery, anterior tibial artery, peroneal artery, and posterior tibial artery with patch angioplasty left popliteal artery using bovine pericardial patch.  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  March 28: Transesophageal echocardiogram: Left ventricular ejection fraction 55-60%. No evidence of vegetation. No evidence of thrombus. Atrial septal aneurysm, otherwise essentially normal. No source of embolism identified.  March 29: EEG: mild generalized slowing, and no epileptiform activity.  April 4: Left below the knee amputation  Antibiotics:  March 22-April 2: Ciprofloxacin  March 18-March 24, April 1-: Zosyn  March 18-: Vancomycin  Interim History: Interval documentation reviewed. Neurology recommended discontinuing Plavix. Continue warfarin. One episode of hypoglycemia.  Subjective: Complains of pain in left leg.  Objective: Filed Vitals:   04/24/11 1800 04/24/11 2026 04/24/11 2126 04/25/11 0628  BP:   117/60 129/67  Pulse:   94 100  Temp:   97.7 F (36.5 C) 98.9 F (37.2 C)  TempSrc:      Resp: 20 12 18 18   Height:      Weight:      SpO2: 98% 96% 95% 100%    Intake/Output Summary (Last 24 hours) at 04/25/11 0949 Last data filed at 04/25/11 0700  Gross per 24 hour  Intake    600 ml  Output      0 ml  Net    600 ml    Exam:   General:  Appears to be in pain.  Cardiovascular: Regular rate and rhythm. No murmur, rub, gallop.   Respiratory: Clear to auscultation bilaterally. No wheezes, rales, rhonchi. Normal respiratory effort.  Data Reviewed: Basic Metabolic Panel:  Lab [id number] 04/21/11 0615 04/20/11 2220 04/19/11 0401  NA 143 138 138 136  K 4.6 4.6 -- --  CL 116* 110 108 105  CO2 19 19 20 25   GLUCOSE 150* 414* 283* 98  BUN 8 9 7 7   CREATININE 1.26* 1.43* 1.34* 1.45*  CALCIUM 7.4* 6.9* 7.0* 7.8*  MG -- -- -- --  PHOS -- -- -- --   Liver Function Tests:  Lab 04/20/11 2220  AST 11  ALT 8  ALKPHOS 93  BILITOT <0.1*  PROT 4.6*  ALBUMIN 0.6*   CBC:  Lab  04/24/11 0544 04/23/11 0455 04/20/11 2220 04/20/11 0515 04/19/11 0401  WBC 16.7* 16.0* 16.8* 14.0* 13.2*  NEUTROABS -- -- -- -- 8.7*  HGB 8.3* 8.9* 9.0* 9.7* 6.4*  HCT 25.2* 27.1* 26.8* 29.2* 19.4*  MCV 90.6 92.2 88.7 89.8 91.9  PLT 675* 739* 734* 871* 921*   CBG:  Lab 04/25/11 0715 04/24/11 2127 04/24/11 1610 04/24/11 1129 04/24/11 0630  GLUCAP 216* 97 101* 227* 229*    Recent Results (from the past 240 hour(s))  SURGICAL PCR SCREEN     Status: Normal   Collection Time   04/22/11 12:40 AM      Component Value Range Status Comment   MRSA, PCR NEGATIVE  NEGATIVE  Final    Staphylococcus aureus NEGATIVE  NEGATIVE  Final      Studies:  Scheduled Meds:    . enoxaparin (LOVENOX) injection  100 mg Subcutaneous Daily  . feeding supplement  1 Container Oral TID WC  . fentaNYL  12.5 mcg Transdermal Q72H  . insulin aspart  0-15 Units Subcutaneous TID WC  . insulin aspart  0-5 Units Subcutaneous QHS  . insulin glargine  5 Units Subcutaneous QHS  . morphine   Intravenous Q4H  . morphine      . morphine      . mulitivitamin with minerals  1 tablet Oral Daily  . omega-3 acid ethyl esters  1 g Oral Daily  . pantoprazole  40 mg Oral Q1200  . potassium chloride SA  20 mEq Oral TID  . sertraline  50 mg Oral Daily  . simvastatin  20 mg Oral q1800  . warfarin  3 mg Oral ONCE-1800  . Warfarin - Pharmacist Dosing Inpatient  Does not apply q1800  . DISCONTD: enoxaparin (LOVENOX) injection  40 mg Subcutaneous Q24H  . DISCONTD: enoxaparin (LOVENOX) injection  70 mg Subcutaneous BID  . DISCONTD: insulin glargine  10 Units Subcutaneous QHS   Continuous Infusions:    . sodium chloride          Murray Hodgkins, MD  [facility] Pager [phone] 04/25/2011, 9:49 AM    LOS: 20 days

## 2011-04-25 NOTE — Progress Notes (Signed)
ANTICOAGULATION CONSULT NOTE - Initial Consult  Pharmacy Consult:  Lovenox / Coumadin Indication:  Embolic strokes  Allergies  Allergen Reactions  . Aspirin Other (See Comments)    Cannot take this medication due to kidney disease  . Ibuprofen Other (See Comments)    Kidney disease  . Losartan Swelling    Patient Measurements: Height: 5\' 1"  (154.9 cm) Weight: 150 lb 9.6 oz (68.312 kg) IBW/kg (Calculated) : 47.8   Vital Signs: Temp: 98.9 F (37.2 C) (04/07 0628) BP: 129/67 mmHg (04/07 0628) Pulse Rate: 100  (04/07 0628)  Labs:  Basename 04/25/11 0620 04/24/11 0544 04/23/11 0455  HGB -- 8.3* 8.9*  HCT -- 25.2* 27.1*  PLT -- 675* 739*  APTT -- -- --  LABPROT 23.2* 20.9* 20.6*  INR 2.02* 1.77* 1.73*  HEPARINUNFRC -- -- --  CREATININE -- -- 1.26*  CKTOTAL -- -- --  CKMB -- -- --  TROPONINI -- -- --   Estimated Creatinine Clearance: 51.9 ml/min (by C-G formula based on Cr of 1.26).  Medical History: Past Medical History  Diagnosis Date  . Shingles     11/2010  . Cellulitis     of face - 12/2010  . Peripheral vascular disease in diabetes mellitus   . Hyperlipidemia   . Tobacco abuse     0.5 - 1ppd x 21 yrs  . Headache   . Renal disorder     glomerulonephritis  . Blood transfusion   . Anxiety   . Depression   . Peripheral vascular disease   . Diabetes mellitus     IDDM Since Age 37  . Embolism - blood clot   . Renal insufficiency      Assessment: 41 YOF with h/o thromboembolic occlusion of LLE and new CVA's to continue on Coumadin and full-dose Lovenox.  INR trending up toward goal, no bleeding noted.  Patient's renal function appropriate for Lovenox dosing.  Goal of Therapy:  INR 2.5 - 3 per MD    Plan:  - Lovenox 100mg  SQ daily until INR therapeutic (1.5mg /kg/dose) - Repeat Coumadin 3mg  PO today - Daily PT/INR, CBC Q72H while on Lovenox - BMET in AM, monitor CBGs    Herald Vallin D. Mina Marble, PharmD, BCPS Pager:  [phone] 04/25/2011, 11:23 AM

## 2011-04-26 LAB — PROTEIN S ACTIVITY: Protein S Activity: 34 % — ABNORMAL LOW (ref 69–129)

## 2011-04-26 LAB — CBC
HCT: 27 % — ABNORMAL LOW (ref 36.0–46.0)
Hemoglobin: 8.6 g/dL — ABNORMAL LOW (ref 12.0–15.0)
MCH: 30.2 pg (ref 26.0–34.0)
MCHC: 31.9 g/dL (ref 30.0–36.0)
MCV: 94.7 fL (ref 78.0–100.0)
Platelets: 774 10*3/uL — ABNORMAL HIGH (ref 150–400)
RBC: 2.85 MIL/uL — ABNORMAL LOW (ref 3.87–5.11)
RDW: 16.3 % — ABNORMAL HIGH (ref 11.5–15.5)
WBC: 13.5 10*3/uL — ABNORMAL HIGH (ref 4.0–10.5)

## 2011-04-26 LAB — BASIC METABOLIC PANEL
BUN: 14 mg/dL (ref 6–23)
CO2: 15 mEq/L — ABNORMAL LOW (ref 19–32)
Calcium: 8.6 mg/dL (ref 8.4–10.5)
Chloride: 112 mEq/L (ref 96–112)
Creatinine, Ser: 1.19 mg/dL — ABNORMAL HIGH (ref 0.50–1.10)
GFR calc Af Amer: 65 mL/min — ABNORMAL LOW (ref >90)
GFR calc non Af Amer: 56 mL/min — ABNORMAL LOW (ref >90)
Glucose, Bld: 166 mg/dL — ABNORMAL HIGH (ref 70–99)
Potassium: 4.4 mEq/L (ref 3.5–5.1)
Sodium: 141 mEq/L (ref 135–145)

## 2011-04-26 LAB — GLUCOSE, CAPILLARY
Glucose-Capillary: 131 mg/dL — ABNORMAL HIGH (ref 70–99)
Glucose-Capillary: 129 mg/dL — ABNORMAL HIGH (ref 70–99)
Glucose-Capillary: 196 mg/dL — ABNORMAL HIGH (ref 70–99)
Glucose-Capillary: 182 mg/dL — ABNORMAL HIGH (ref 70–99)

## 2011-04-26 LAB — LUPUS ANTICOAGULANT PANEL
DRVVT: 68.3 secs — ABNORMAL HIGH (ref <45.1)
Lupus Anticoagulant: DETECTED — AB
PTT Lupus Anticoagulant: 64.2 secs — ABNORMAL HIGH (ref 28.0–43.0)
PTTLA 4:1 Mix: 53.1 secs — ABNORMAL HIGH (ref 28.0–43.0)
PTTLA Confirmation: 25.9 secs — ABNORMAL HIGH (ref <8.0)
dRVVT Incubated 1:1 Mix: 42.9 secs (ref <45.1)

## 2011-04-26 LAB — PROTEIN C ACTIVITY: Protein C Activity: 71 % — ABNORMAL LOW (ref 75–133)

## 2011-04-26 LAB — PROTIME-INR
INR: 2.16 — ABNORMAL HIGH (ref 0.00–1.49)
Prothrombin Time: 24.5 seconds — ABNORMAL HIGH (ref 11.6–15.2)

## 2011-04-26 LAB — PROTEIN C, TOTAL: Protein C, Total: 90 % (ref 72–160)

## 2011-04-26 LAB — PROTEIN S, TOTAL: Protein S Ag, Total: 74 % (ref 60–150)

## 2011-04-26 MED ORDER — WARFARIN SODIUM 4 MG PO TABS
4.0000 mg | ORAL_TABLET | Freq: Once | ORAL | Status: AC
  Administered 2011-04-26: 4 mg via ORAL
  Filled 2011-04-26: qty 1

## 2011-04-26 MED ORDER — FUROSEMIDE 40 MG PO TABS
40.0000 mg | ORAL_TABLET | Freq: Every day | ORAL | Status: DC
  Administered 2011-04-26 – 2011-04-28 (×3): 40 mg via ORAL
  Filled 2011-04-26 (×3): qty 1

## 2011-04-26 NOTE — Progress Notes (Signed)
error 

## 2011-04-26 NOTE — Progress Notes (Addendum)
Physical Therapy Treatment Patient Details Name: [name] MRN: [id number] DOB: [birth year] Today's Date: 04/26/2011  PT Assessment/Plan  PT - Assessment/Plan Comments on Treatment Session: Pt admitted s/p left LE BKA and is very limited by pain.  Pt extremely tearful this am stating, "I want my Mom" repeatedly.  Decreased attention and ability to follow commands although willing to attempt mobility.  Feel pt would be unable at this point to tolerate the increased demands of Inpatient Rehab and would benefit more from SNF.  Spoke with Inpatient Rehab Admissions Coordinator about this current recommendation.  Pt also continues to hold left LE in extreme hip/knee flexion even with multiple attempts to facilitate maintained extension.  Feel a KI may assist pt in preventing contractures.  MD please order KI if in agreement.   Will follow.  PT Plan: Discharge plan needs to be updated;Frequency remains appropriate PT Frequency: Min 3X/week Follow Up Recommendations: Skilled nursing facility Equipment Recommended: Defer to next venue PT Goals  Acute Rehab PT Goals PT Goal Formulation: With patient Time For Goal Achievement: 7 days PT Goal: Supine/Side to Sit - Progress: Progressing toward goal PT Goal: Sit to Stand - Progress: Progressing toward goal PT Goal: Stand to Sit - Progress: Progressing toward goal PT Transfer Goal: Bed to Chair/Chair to Bed - Progress: Progressing toward goal PT Goal: Perform Home Exercise Program - Progress: Progressing toward goal  PT Treatment Precautions/Restrictions  Precautions Precautions: Fall Required Braces or Orthoses: No Restrictions Weight Bearing Restrictions: Yes LLE Weight Bearing: Non weight bearing LLE Partial Weight Bearing Percentage or Pounds: BKA Other Position/Activity Restrictions: Pt s/p left LE BKA. Pain 7/10 in left residual limb.  RN aware and pt repositioned. Mobility (including Balance) Bed Mobility Bed Mobility: Yes Rolling  Right: 1: +2 Total assist;Patient percentage (comment) ((pt=15%)) Rolling Right Details (indicate cue type and reason): Assist to rotate trunk/pelvis with cues for sequence and to use bilateral UEs to reach for rail. Rolling Left: 1: +2 Total assist;Patient percentage (comment) ((pt=15%)) Rolling Left Details (indicate cue type and reason): Assist to rotate trunk/pelvis with cues to reach bilateral UEs to rail in order to assist. Supine to Sit: 1: +2 Total assist;Patient percentage (comment);HOB flat ((pt=15%)) Supine to Sit Details (indicate cue type and reason): Assist to facilitate movement of bilateral LEs off EOB as well as to trunk to translate to midline.  Max cues for eye opening and increased participation. Sitting - Scoot to Edge of Bed: Not tested (comment) Transfers Transfers: Yes Sit to Stand: 1: +2 Total assist;Patient percentage (comment);With upper extremity assist;From bed ((pt=15%)) Sit to Stand Details (indicate cue type and reason): Assist to translate trunk anterior over BOS with blocking to right knee to prevent buckling with assist at ischials.  Cues for sequence. Stand to Sit: 1: +2 Total assist;Patient percentage (comment);With upper extremity assist;To chair/3-in-1 ((pt=15%)) Stand to Sit Details: Assist to control descent to chair with continued blocking to right knee to prevent buckling.  Max cues for increased participation.  Pt limited by pain, however. Squat Pivot Transfers: 1: +2 Total assist;Patient percentage (comment) ((pt=15%)) Squat Pivot Transfer Details (indicate cue type and reason): Assist to facilitate rotation of hips/pelvis with cues for sequence along with blocking of right knee to prevent buckling.  Cues for sequence and increased participation.  Pt very limited by pain. Ambulation/Gait Ambulation/Gait: No Stairs: No Wheelchair Mobility Wheelchair Mobility: No  Posture/Postural Control Posture/Postural Control: No significant  limitations Balance Balance Assessed: Yes Static Sitting Balance Static Sitting - Balance Support:  Bilateral upper extremity supported;Feet supported Static Sitting - Level of Assistance: 3: Mod assist (Able to progress to min assist.) Static Sitting - Comment/# of Minutes: Assist to attain/maintain midline trunk over BOS with cues for sequence.  Assist to extend bilateral hips for thighs to rest on surface versus tendency of pt to increase hip flexion bilaterally. Exercise  Amputee Exercises Quad Sets: AAROM;Right;10 reps;Supine Hip Extension: AAROM;Left;10 reps;Supine Knee Extension: PROM;Left;5 reps;Supine End of Session PT - End of Session Equipment Utilized During Treatment: Gait belt Activity Tolerance: Patient limited by pain Patient left: in chair;with call bell in reach Nurse Communication: Mobility status for transfers General Behavior During Session: Other (comment) (Pt extremely tearful throughout.) Cognition: Impaired Cognitive Impairment: Decreased attention to sustained with max cues throughout to increase attention as well as participation.  Difficulty problem solving with inconsistent response to one-step commands.  Cyndia Bent 04/26/2011, 12:06 PM  04/26/2011 Cyndia Bent, PT, DPT [phone]

## 2011-04-26 NOTE — Progress Notes (Signed)
PROGRESS NOTE  [name] [id number] DOB: [birth year] DOA: 04/05/2011 PCP: Alonza Bogus, MD, MD Orthopedic surgeon: Meridee Score, M.D. Endocrinologist: [name], MD  Assessment/Plan: 1. Left foot ischemia: Status post amputation. No convincing evidence of osteomyelitis by CT or MRI of the foot. Antibiotics discontinued. Leukocytosis has been persistent since December 2012. Continue pain control. Physical therapy. Pain control remains an issue. Continue fentanyl patch. 2. Diabetes mellitus type I, uncontrolled hemoglobin A1c 7.1: Stable. No hypoglycemia last 24 hours. Continue Lantus. Sliding-scale insulin.  3. Acute renal failure: Appears resolved. 4. Anemia: Stable. Has recently been transfused so anemia panel not likely to be of use. Likely postoperative in nature. Continue warfarin for now. 5. Stroke, embolic: Warfarin with therapeutic Lovenox bridge. Goal INR 2.5-3. No aspirin secondary to allergy. No Plavix as bleeding risk likely outweighs stroke prevention. MRI earlier in hospitalization showed multiple acute infarcts in different vascular distributions on the right suggestive of embolic phenomena. MRA did reveal moderate stenosis of both carotids and the cavernous portion of the vessel. Transesophageal echocardiogram was unrevealing. 6. 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 7. Cocaine abuse 8. Cigarette smoker   Code Status: Full code Family Communication: None bedside Disposition Plan: Inpatient rehabilitation versus skilled nursing facility one to 2 days.  Brief narrative: 41 year old woman presented to the emergency department at [facility] 05/07/2011. She was admitted for uncontrolled type 1 diabetes mellitus, presumptive infection of left foot. She was started on antibiotics and refused  skilled nursing facility placement. Cardiology was consulted for abnormal EKG and she underwent a nuclear stress test which showed no new area of ischemia. Approximately 8 days into her hospitalization she developed confusion. MRI suggested multiple embolic strokes. TEE was performed. Neurology consultation obtained with the recommendation that Plavix as well as Coumadin be continued. EEG was unrevealing. Partially 10 days and the patient's hospitalization ischemia of her foot worse in an orthopedic/neurosurgery consultation was obtained. She was noted to be anemic and given a blood transfusion. Gen. surgery recommended transfer to [facility]. Transfer was accomplished approximately 13 days into her hospitalization. Vascular surgery saw the patient and recommended below the knee amputation. Orthopedic consultation was obtained in amputation was performed. Rehabilitation is being pursued.  Chart review:  03/27/2011-03/31/2011 hospitalization: Angioedema of the face possibly related to losartan, gangrene of left toe  03/03/2011-03/04/2011 hospitalization: Gangrene left foot. Underwent infiltration of the fourth and fifth rays left foot.  02/11/2011-02/14/2011 hospitalization: Left lower extremity cellulitis  01/27/2011-02/04/2011 hospitalization: Left leg ischemia secondary to peripheral emboli, etiology unknown. Status post exploration of left popliteal artery with thrombectomy left superficial femoral artery, anterior tibial artery, peroneal artery, and posterior tibial artery with patch angioplasty left popliteal artery using bovine pericardial patch.  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  March 28: Transesophageal echocardiogram: Left ventricular ejection fraction 55-60%. No evidence of vegetation. No evidence of thrombus. Atrial septal aneurysm, otherwise essentially normal. No source of embolism identified.  March 29: EEG: mild generalized slowing, and no epileptiform activity.  April 4: Left  below the knee amputation  Antibiotics:  March 22-April 2: Ciprofloxacin  March 18-March 24, April 1-: Zosyn  March 18-: Vancomycin  Interim History: Interval documentation reviewed. Neurology recommended discontinuing Plavix. Continue warfarin. One episode of hypoglycemia.  Subjective: Complains of pain in left leg.  Objective: Filed Vitals:   04/25/11 2130 04/26/11 0028 04/26/11 0538 04/26/11 0800  BP: 107/71  107/68   Pulse: 108  99   Temp: 98.2 F (36.8 C)  98 F (36.7 C)   TempSrc: Oral  Oral   Resp: 18 18 18 18   Height:      Weight:      SpO2: 100% 98% 98% 98%    Intake/Output Summary (Last 24 hours) at 04/26/11 1017 Last data filed at 04/26/11 0700  Gross per 24 hour  Intake    480 ml  Output      2 ml  Net    478 ml    Exam:   General:  Appears to be in pain.  Cardiovascular: Regular rate and rhythm. No murmur, rub, gallop.   Respiratory: Clear to auscultation bilaterally. No wheezes, rales, rhonchi. Normal respiratory effort.  Data Reviewed: Basic Metabolic Panel:  Lab 0000000 0515 04/23/11 0455 04/21/11 0615 04/20/11 2220  NA 141 143 138 138  K 4.4 4.6 -- --  CL 112 116* 110 108  CO2 15* 19 19 20   GLUCOSE 166* 150* 414* 283*  BUN 14 8 9 7   CREATININE 1.19* 1.26* 1.43* 1.34*  CALCIUM 8.6 7.4* 6.9* 7.0*  MG -- -- -- --  PHOS -- -- -- --   Liver Function Tests:  Lab 04/20/11 2220  AST 11  ALT 8  ALKPHOS 93  BILITOT <0.1*  PROT 4.6*  ALBUMIN 0.6*   CBC:  Lab 04/26/11 0515 04/24/11  0544 04/23/11 0455 04/20/11 2220 04/20/11 0515  WBC 13.5* 16.7* 16.0* 16.8* 14.0*  NEUTROABS -- -- -- -- --  HGB 8.6* 8.3* 8.9* 9.0* 9.7*  HCT 27.0* 25.2* 27.1* 26.8* 29.2*  MCV 94.7 90.6 92.2 88.7 89.8  PLT 774* 675* 739* 734* 871*   CBG:  Lab 04/26/11 0725 04/25/11 2132 04/25/11 1623 04/25/11 1123 04/25/11 0715  GLUCAP 131* 171* 120* 159* 216*    Recent Results (from the past 240 hour(s))  SURGICAL PCR SCREEN     Status: Normal   Collection Time   04/22/11 12:40 AM      Component Value Range Status Comment   MRSA, PCR NEGATIVE  NEGATIVE  Final    Staphylococcus aureus NEGATIVE  NEGATIVE  Final      Studies:  Scheduled Meds:    . enoxaparin (LOVENOX) injection  100 mg Subcutaneous Daily  . feeding supplement  1 Container Oral TID WC  . fentaNYL  12.5 mcg Transdermal Q72H  . insulin aspart  0-15 Units Subcutaneous TID WC  . insulin aspart  0-5 Units Subcutaneous QHS  . insulin glargine  5 Units Subcutaneous QHS  . morphine   Intravenous Q4H  . morphine      . mulitivitamin with minerals  1 tablet Oral Daily  . omega-3 acid ethyl esters  1 g Oral Daily  . pantoprazole  40 mg Oral Q1200  . potassium chloride SA  20 mEq Oral TID  . sertraline  50 mg Oral Daily  . simvastatin  20 mg Oral q1800  . warfarin  3 mg Oral ONCE-1800  . warfarin  4 mg Oral ONCE-1800  . Warfarin - Pharmacist Dosing Inpatient   Does not  apply q1800   Continuous Infusions:    . DISCONTD: sodium chloride 20 mL/hr at 04/26/11 0700        Murray Hodgkins, MD  [facility] Pager [phone] 04/26/2011, 10:17 AM    LOS: 21 days

## 2011-04-26 NOTE — Progress Notes (Signed)
Rehab admissions - Evaluated for possible admission.  Patient is crying out this morning, calling for "Daddy".  She says she is in a lot of pain.  Currently up in chair.  I tried to examine her, but she has had a bowel movement in her chair.  She seems confused today.  Saying she wants to go home.  Crying and "whining" this morning.  I spoke with PT who says patient cannot tolerate inpatient rehab at this time.  PT feels patient will need SNF.  I will contact family to discuss options.  Patient is not cooperative enough to be able to participate in an intense rehab program.  Constantly yelling out for Daddy and Mommy to help her.  Will follow for progress.  I would plan for SNF unless we see some dramatic improvement in mentation and participation.  Call me for questions.  Pager [phone]

## 2011-04-26 NOTE — Progress Notes (Signed)
Clinical Social Work Department BRIEF PSYCHOSOCIAL ASSESSMENT 04/26/2011  Patient:  [name], [name]     Account Number:  [id number]     Admit date:  04/05/2011  Clinical Social Worker:  Robbi Garter  Date/Time:  04/26/2011 10:30 AM  Referred by:  Physician  Date Referred:  03/22/2011 Referred for  SNF Placement   Other Referral:   Interview type:  Family Other interview type:    PSYCHOSOCIAL DATA Living Status:  FAMILY Admitted from facility:   Level of care:   Primary support name:  Santiago Glad Primary support relationship to patient:  PARENT Degree of support available:   Adequate    CURRENT CONCERNS Current Concerns  Post-Acute Placement   Other Concerns:    SOCIAL WORK ASSESSMENT / PLAN CSW spoke with pt's mother today via phone; pt confused at time of attempted visit. Possible CIR admission noted. Pt's mother repots pt's family agreeable to to SNF search in [facility] as b/u plan to CIR. CSW to complete FL2 and initiate SNF search. Will f/u with offers to pt/pt's family when available.   Assessment/plan status:  Information/Referral to [employer] Other assessment/ plan:   Information/referral to community resources:   SNF    PATIENT'S/FAMILY'S RESPONSE TO PLAN OF CARE: Pt's family reports agreeable to ST SNF in order to pt to increase strenght and independence with mobility prior to return home with family. Pt's family reports CIR is preference but agreeable to SNF if pt's needs better met at SNF level of care.        Wandra Feinstein, MSW, [place] [phone] (coverage)

## 2011-04-26 NOTE — Progress Notes (Signed)
Patient ID: [name], female   DOB: 1970/11/06, 41 y.o.   MRN: [id number] Patient complains of persistent pain left transtibial amputation. This is most likely due to her severe peripheral vascular disease. We'll need to reevaluate her residual limb in about 2 weeks to determine healing capabilities.

## 2011-04-26 NOTE — Progress Notes (Signed)
ANTICOAGULATION CONSULT NOTE - Initial Consult  Pharmacy Consult:  Lovenox / Coumadin Indication:  Embolic strokes  Allergies  Allergen Reactions  . Aspirin Other (See Comments)    Cannot take this medication due to kidney disease  . Ibuprofen Other (See Comments)    Kidney disease  . Losartan Swelling    Patient Measurements: Height: 5\' 1"  (154.9 cm) Weight: 150 lb 9.6 oz (68.312 kg) IBW/kg (Calculated) : 47.8   Vital Signs: Temp: 98 F (36.7 C) (04/08 0538) Temp src: Oral (04/08 0538) BP: 107/68 mmHg (04/08 0538) Pulse Rate: 99  (04/08 0538)  Labs:  Basename 04/26/11 0515 04/25/11 0620 04/24/11 0544  HGB 8.6* -- 8.3*  HCT 27.0* -- 25.2*  PLT 774* -- 675*  APTT -- -- --  LABPROT 24.5* 23.2* 20.9*  INR 2.16* 2.02* 1.77*  HEPARINUNFRC -- -- --  CREATININE 1.19* -- --  CKTOTAL -- -- --  CKMB -- -- --  TROPONINI -- -- --   Estimated Creatinine Clearance: 55 ml/min (by C-G formula based on Cr of 1.19).  Medical History: Past Medical History  Diagnosis Date  . Shingles     11/2010  . Cellulitis     of face - 12/2010  . Peripheral vascular disease in diabetes mellitus   . Hyperlipidemia   . Tobacco abuse     0.5 - 1ppd x 21 yrs  . Headache   . Renal disorder     glomerulonephritis  . Blood transfusion   . Anxiety   . Depression   . Peripheral vascular disease   . Diabetes mellitus     IDDM Since Age 32  . Embolism - blood clot   . Renal insufficiency      Assessment: 41 YOF with h/o thromboembolic occlusion of LLE and new CVAs to continue on Coumadin and full-dose Lovenox.  INR trending up toward goal slowly, no bleeding noted.  Patient's renal function continues to be appropriate for Lovenox dosing.  Goal of Therapy:  INR 2.5 - 3 per MD    Plan:  - Lovenox 100mg  SQ daily until INR therapeutic (1.5mg /kg/dose) - Coumadin 4 mg PO today - suspect will need ~3mg  for maintenance dose - Daily PT/INR, CBC Q72H while on Lovenox  Sherlon Handing, PharmD,  BCPS Clinical pharmacist, pager [phone] 04/26/2011, 9:34 AM

## 2011-04-26 NOTE — Progress Notes (Addendum)
Clinical Social Work Department CLINICAL SOCIAL WORK PLACEMENT NOTE 04/26/2011  Patient:  [name], [name]  Account Number:  [id number] Admit date:  04/05/2011  Clinical Social Worker:  Wandra Feinstein, Latanya Presser  Date/time:  04/26/2011 01:00 PM  Clinical Social Work is seeking post-discharge placement for this patient at the following level of care:   [place]   (*CSW will update this form in Epic as items are completed)   04/26/2011  Patient/family provided with [facility] Department of Clinical Social Work's list of facilities offering this level of care within the geographic area requested by the patient (or if unable, by the patient's family).  04/26/2011  Patient/family informed of their freedom to choose among providers that offer the needed level of care, that participate in Medicare, Medicaid or managed care program needed by the patient, have an available bed and are willing to accept the patient.  04/26/2011  Patient/family informed of [facility]' ownership interest in [facility], as well as of the fact that they are under no obligation to receive care at this facility.  PASARR submitted to EDS on 04/22/2011 PASARR number received from EDS on 04/22/2011  FL2 transmitted to all facilities in geographic area requested by pt/family on  04/26/2011 FL2 transmitted to all facilities within larger geographic area on   Patient informed that his/her managed care company has contracts with or will negotiate with  certain facilities, including the following:     Patient/family informed of bed offers received:  04/27/2011 Patient chooses bed at: [facility]  04/27/2011  Physician recommends and patient chooses bed at    Patient to be transferred to  on   Patient to be transferred to facility by   The following physician request were entered in Epic:   Additional Comments:  Wandra Feinstein, MSW, [place]

## 2011-04-27 LAB — CBC
HCT: 25 % — ABNORMAL LOW (ref 36.0–46.0)
Hemoglobin: 8 g/dL — ABNORMAL LOW (ref 12.0–15.0)
MCH: 29.1 pg (ref 26.0–34.0)
MCHC: 32 g/dL (ref 30.0–36.0)
MCV: 90.9 fL (ref 78.0–100.0)
Platelets: 681 10*3/uL — ABNORMAL HIGH (ref 150–400)
RBC: 2.75 MIL/uL — ABNORMAL LOW (ref 3.87–5.11)
RDW: 16.2 % — ABNORMAL HIGH (ref 11.5–15.5)
WBC: 9.7 10*3/uL (ref 4.0–10.5)

## 2011-04-27 LAB — FACTOR 5 LEIDEN

## 2011-04-27 LAB — GLUCOSE, CAPILLARY
Glucose-Capillary: 145 mg/dL — ABNORMAL HIGH (ref 70–99)
Glucose-Capillary: 224 mg/dL — ABNORMAL HIGH (ref 70–99)
Glucose-Capillary: 297 mg/dL — ABNORMAL HIGH (ref 70–99)
Glucose-Capillary: 276 mg/dL — ABNORMAL HIGH (ref 70–99)

## 2011-04-27 LAB — PROTIME-INR
INR: 3.89 — ABNORMAL HIGH (ref 0.00–1.49)
Prothrombin Time: 38.7 seconds — ABNORMAL HIGH (ref 11.6–15.2)

## 2011-04-27 MED ORDER — INSULIN GLARGINE 100 UNIT/ML ~~LOC~~ SOLN: 5.0000 [IU] | Freq: Every day | SUBCUTANEOUS | Status: DC

## 2011-04-27 MED ORDER — OXYCODONE-ACETAMINOPHEN 5-325 MG PO TABS: 1.0000 | ORAL_TABLET | ORAL | Status: AC | PRN

## 2011-04-27 MED ORDER — FENTANYL 12 MCG/HR TD PT72: 1.0000 | MEDICATED_PATCH | TRANSDERMAL | Status: AC

## 2011-04-27 MED ORDER — FENTANYL 12 MCG/HR TD PT72: 2.0000 | MEDICATED_PATCH | TRANSDERMAL | Status: DC

## 2011-04-27 MED ORDER — BOOST / RESOURCE BREEZE PO LIQD: 1.0000 | Freq: Three times a day (TID) | ORAL | Status: DC

## 2011-04-27 MED ORDER — ALPRAZOLAM 1 MG PO TABS: 1.0000 mg | ORAL_TABLET | Freq: Every day | ORAL | Status: DC | PRN

## 2011-04-27 NOTE — Progress Notes (Signed)
PROGRESS NOTE  [name] [id number] DOB: 1970/10/10 DOA: 04/05/2011 PCP: Alonza Bogus, MD, MD Orthopedic surgeon: Meridee Score, M.D. Endocrinologist: [name], MD Neurologist: Joselyn Arrow  Assessment/Plan: 1. Left foot ischemia: Status post amputation. No convincing evidence of osteomyelitis by CT or MRI of the foot. Antibiotics discontinued. Leukocytosis resolved. Continue pain control. Physical therapy.  2. Diabetes mellitus type I, uncontrolled hemoglobin A1c 7.1: Stable. Continue Lantus. Sliding-scale insulin.  3. Acute renal failure: Resolved 4. Anemia: Stable. Has recently been transfused so anemia panel not likely to be of use. Likely postoperative in nature. Continue warfarin for now. 5. Stroke, embolic: Warfarin with therapeutic Lovenox bridge. Goal INR 2.5-3. No aspirin secondary to allergy. No Plavix as bleeding risk likely outweighs stroke prevention. MRI earlier in hospitalization showed multiple acute infarcts in different vascular distributions on the right suggestive of embolic phenomena. MRA did reveal moderate stenosis of both carotids and the cavernous portion of the vessel. Transesophageal echocardiogram was unrevealing. 6. 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 7. Cocaine abuse 8. Cigarette smoker   Code Status: Full code Family Communication: None bedside Disposition Plan: Skilled nursing facility April 10.  Brief narrative: 41 year old woman presented to the emergency department at [facility] 05/07/2011. She was admitted for uncontrolled type 1 diabetes mellitus, presumptive infection of left foot. She was started on antibiotics and refused skilled nursing facility placement. Cardiology was consulted for abnormal EKG and she underwent a nuclear stress test which showed no new  area of ischemia. Approximately 8 days into her hospitalization she developed confusion. MRI suggested multiple embolic strokes. TEE was performed. Neurology consultation obtained with the recommendation that Plavix as well as Coumadin be continued. EEG was unrevealing. Partially 10 days and the patient's hospitalization ischemia of her foot worse in an orthopedic/neurosurgery consultation was obtained. She was noted to be anemic and given a blood transfusion. Gen. surgery recommended transfer to [facility]. Transfer was accomplished approximately 13 days into her hospitalization. Vascular surgery saw the patient and recommended below the knee amputation. Orthopedic consultation was obtained in amputation was performed. Rehabilitation is being pursued.  Chart review:  03/27/2011-03/31/2011 hospitalization: Angioedema of the face possibly related to losartan, gangrene of left toe  03/03/2011-03/04/2011 hospitalization: Gangrene left foot. Underwent infiltration of the fourth and fifth rays left foot.  02/11/2011-02/14/2011 hospitalization: Left lower extremity cellulitis  01/27/2011-02/04/2011 hospitalization: Left leg ischemia secondary to peripheral emboli, etiology unknown. Status post exploration of left popliteal artery with thrombectomy left superficial femoral artery, anterior tibial artery, peroneal artery, and posterior tibial artery with patch angioplasty left popliteal artery using bovine pericardial patch.  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  March 28: Transesophageal echocardiogram: Left ventricular ejection fraction 55-60%. No evidence of vegetation. No evidence of thrombus. Atrial septal aneurysm, otherwise essentially normal. No source of embolism identified.  March 29: EEG: mild generalized slowing, and no epileptiform activity.  April 4: Left below the knee amputation  Antibiotics:  March 22-April 2: Ciprofloxacin  March 18-March 24, April 1-: Zosyn  March  18-: Vancomycin  Interim History: Interval documentation reviewed.    Subjective: Feels somewhat better though still has pain in leg.  Objective: Filed Vitals:   04/26/11 1524 04/26/11 2147 04/27/11 0549 04/27/11 0755  BP:  135/61 138/77 120/68  Pulse:  91 95 75  Temp:  97.2 F (36.2 C) 97.1 F (36.2 C) 97.3 F (36.3 C)  TempSrc:      Resp: 16 18 18 14   Height:      Weight:      SpO2: 96% 98% 98% 95%    Intake/Output Summary (Last 24 hours) at 04/27/11 1141 Last data filed at 04/27/11 0947  Gross per 24 hour  Intake   1071 ml  Output      0 ml  Net   1071 ml    Exam:   General:  Appears to be in pain.  Cardiovascular: Regular rate and rhythm. No murmur, rub, gallop.   Respiratory: Clear to auscultation bilaterally. No wheezes, rales, rhonchi. Normal respiratory effort.  Data Reviewed: Basic Metabolic Panel:  Lab 0000000 0515 04/23/11 0455 04/21/11 0615 04/20/11 2220  NA 141 143 138 138  K 4.4 4.6 -- --  CL 112 116* 110 108  CO2 15* 19 19 20   GLUCOSE 166* 150* 414* 283*  BUN 14 8 9 7   CREATININE 1.19* 1.26* 1.43* 1.34*  CALCIUM 8.6 7.4* 6.9* 7.0*  MG -- -- -- --  PHOS -- -- -- --   Liver Function Tests:  Lab 04/20/11 2220  AST 11  ALT 8  ALKPHOS 93  BILITOT <0.1*  PROT 4.6*  ALBUMIN 0.6*   CBC:  Lab 04/27/11 0606 04/26/11 0515 04/24/11 0544 04/23/11 0455 04/20/11 2220  WBC 9.7 13.5* 16.7* 16.0* 16.8*  NEUTROABS -- -- -- -- --  HGB 8.0* 8.6* 8.3* 8.9* 9.0*  HCT 25.0* 27.0* 25.2* 27.1* 26.8*  MCV 90.9  94.7 90.6 92.2 88.7  PLT 681* 774* 675* 739* 734*   CBG:  Lab 04/27/11 1129 04/27/11 0658 04/26/11 2148 04/26/11 1631 04/26/11 1140  GLUCAP 224* 145* 182* 196* 129*    Recent Results (from the past 240 hour(s))  SURGICAL PCR SCREEN     Status: Normal   Collection Time   04/22/11 12:40 AM      Component Value Range Status Comment   MRSA, PCR NEGATIVE  NEGATIVE  Final    Staphylococcus aureus NEGATIVE  NEGATIVE  Final     Scheduled Meds:    . feeding supplement  1 Container Oral TID WC  . fentaNYL  12.5 mcg Transdermal Q72H  . furosemide  40 mg Oral Daily  . insulin aspart  0-15 Units Subcutaneous TID WC  . insulin aspart  0-5 Units Subcutaneous QHS  . insulin glargine  5 Units Subcutaneous QHS  . mulitivitamin with minerals  1 tablet Oral Daily  . omega-3 acid ethyl esters  1 g Oral Daily  . pantoprazole  40 mg Oral Q1200  . potassium chloride SA  20 mEq Oral TID  . sertraline  50 mg Oral Daily  . simvastatin  20 mg Oral q1800  . warfarin  4 mg Oral ONCE-1800  . Warfarin - Pharmacist Dosing Inpatient   Does not apply q1800  . DISCONTD: enoxaparin (LOVENOX) injection  100 mg Subcutaneous Daily  . DISCONTD: morphine   Intravenous Q4H   Continuous Infusions:        Murray Hodgkins, MD  [facility] Pager [phone] 04/27/2011, 11:41 AM    LOS: 22 days

## 2011-04-27 NOTE — Progress Notes (Signed)
ANTICOAGULATION CONSULT NOTE - Initial Consult  Pharmacy Consult:  Lovenox / Coumadin Indication:  Embolic strokes  Allergies  Allergen Reactions  . Aspirin Other (See Comments)    Cannot take this medication due to kidney disease  . Ibuprofen Other (See Comments)    Kidney disease  . Losartan Swelling    Patient Measurements: Height: 5\' 1"  (154.9 cm) Weight: 150 lb 9.6 oz (68.312 kg) IBW/kg (Calculated) : 47.8   Vital Signs: Temp: 97.1 F (36.2 C) (04/09 0549) BP: 120/68 mmHg (04/09 0755) Pulse Rate: 75  (04/09 0755)  Labs:  Basename 04/27/11 0606 04/26/11 0515 04/25/11 0620  HGB 8.0* 8.6* --  HCT 25.0* 27.0* --  PLT 681* 774* --  APTT -- -- --  LABPROT 38.7* 24.5* 23.2*  INR 3.89* 2.16* 2.02*  HEPARINUNFRC -- -- --  CREATININE -- 1.19* --  CKTOTAL -- -- --  CKMB -- -- --  TROPONINI -- -- --   Estimated Creatinine Clearance: 55 ml/min (by C-G formula based on Cr of 1.19).  Medical History: Past Medical History  Diagnosis Date  . Shingles     11/2010  . Cellulitis     of face - 12/2010  . Peripheral vascular disease in diabetes mellitus   . Hyperlipidemia   . Tobacco abuse     0.5 - 1ppd x 21 yrs  . Headache   . Renal disorder     glomerulonephritis  . Blood transfusion   . Anxiety   . Depression   . Peripheral vascular disease   . Diabetes mellitus     IDDM Since Age 2  . Embolism - blood clot   . Renal insufficiency      Assessment: 41 YOF with h/o thromboembolic occlusion of LLE and new CVAs. Large increase in INR today, above goal. Ok to d/c lovenox. INR may increase further tomorrow. No bleeding reported.  Goal of Therapy:  INR 2.5 - 3 per MD    Plan:  D/c lovenox. No coumadin today. F/u daily protime.  Davonna Belling, PharmD, BCPS Pager [phone] 04/27/2011, 9:40 AM

## 2011-04-27 NOTE — Progress Notes (Signed)
CSW spoke with pt's dtr to provide bed offers. Pt's dtr has chosen [facility] if CIR not an option. CSW will continue to follow for d/c needs. Wandra Feinstein, MSW, [place] [phone] (coverage)

## 2011-04-27 NOTE — Progress Notes (Signed)
   CARE MANAGEMENT NOTE 04/27/2011  Patient:  [name], [name]   Account Number:  [id number]  Date Initiated:  04/07/2011  Documentation initiated by:  Theophilus Kinds  Subjective/Objective Assessment:   Pt admitted from home with recurrent cellulitis. 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 Possible osteomylitis. Pt lives with mother and fiance. Pt is able to afford meds. Active with [facility].     Action/Plan:   CM spoke with pt about possible need for IV AB at discharge. Pain control is also an issue at this point.   Anticipated DC Date:  04/14/2011   Anticipated DC Plan:  [place]  In-house referral  Clinical Social Worker      DC Planning Services  CM consult      Choice offered to / List presented to:             [place].   Status of service:  In process, will continue to follow Medicare Important Message given?   (If response is "NO", the following Medicare IM given date fields will be blank) Date Medicare IM given:   Date Additional Medicare IM given:    Discharge Disposition:    Per UR Regulation:  Reviewed for med. necessity/level of care/duration of stay  If discussed at [place] of Stay Meetings, dates discussed:   04/15/2011    Comments:  04/27/11 Lisa Squires, RN, BSN 1407 UR COMPLETED  04/23/11 Lisa Pinks, RN, BSN 1157 UR COMPLETED.  PT IS BEING EVALUATED BY CIR FOR INPT REHAB.  04/21/11 Lisa Cassandra Mcmanaman, RN, BSN 1631 PT WAS IN AGREEMENT OF REHAB AT DC.  CIR HAS DONE A DRIVE BY AND WANTS TO MAKE SURE AFTER BKA TO ORDER PT/OT EVAL AND GET A CONSULT IF IT IS RECOMMENDED.  WILL F/U.  PT IS AT HOME WITH FAMILY.  04/20/2011  [place], [place] Met with patient for CM introduction, will continue to follow for discharge planning needs.  04/20/11 [name], BSN [id number] [place], AT [facility]  04/16/11 [name], RN BSN CM CM spoke with pts daughter, [name], who  states that pt is now for SNF. At this point, the daughter is unable to take care of pt and pts mother who has dementia. However, pt daughter states that she only wants this short term until her mother can get IV AB and PT. MD and CSW is aware. CSW will complete paperwork and look for SNF bed.  04/09/11 [name], RN BSN CM Discussed the need for short term placement for IV AB therapy as recommended by Dr. Luan [name]. Pt is agreeable. CSW made aware.  04/07/11 [place], RN BSN CM Pt admitted from home with cellulitis and possible osteomylitis. Pt already active with [facility]. Will resume services at discharge

## 2011-04-27 NOTE — Discharge Summary (Signed)
Physician Discharge Summary  ASPEN SHEWMAKE [id number] DOB: 08/12/1970 DOA: 04/05/2011  PCP: Alonza Bogus, MD, MD Orthopedic surgeon: Meridee Score, M.D. Endocrinologist: [name], MD Neurologist: Phillips Odor  Admit date: 04/05/2011 Discharge date: 04/28/2011  Discharge Diagnoses:  1. Left foot ischemia 2. Diabetes mellitus type 1, uncontrolled 3. Acute renal failure, resolved 4. Anemia, stable 5. Multiple embolic strokes 6. Cocaine abuse  Discharge Condition: Improved  Disposition: Skilled nursing facility  History of present illness:  41 year old woman presented to the emergency department at [facility] 05/07/2011. She was admitted for uncontrolled type 1 diabetes mellitus, presumptive infection of left foot. She was started on antibiotics and refused skilled nursing facility placement. Cardiology was consulted for abnormal EKG and she underwent a nuclear stress test which showed no new area of ischemia. Approximately 8 days into her hospitalization she developed confusion. MRI suggested multiple embolic strokes. TEE was performed and showed no source of embolism. Neurology consultation obtained with the recommendation that Plavix as well as Coumadin be continued. EEG was unrevealing. Approximately 2 weeks into the patient's hospitalization ischemia of her foot worsened and an orthopedic/neurosurgery consultation was obtained. She was noted to be anemic and given a blood transfusion. General surgery recommended transfer to [facility] Course:  Ms. Deveaux was transferred to [facility] for vascular surgery and orthopedic surgery evaluations. Vascular surgery saw the patient and recommended below the knee amputation and deferred this to orthopedics. Orthopedic consultation was obtained and amputation was successfully performed after clearance from cardiology. Diabetes has been somewhat labile but better controlled with adjustment of her Lantus.  Acute renal followup failure resolved with supportive care. Postoperative anemia is stable status post transfusion. Because of her history of limb ischemia as well as multiple embolic strokes neurology consultation was obtained with recommendations as listed below. Hypercoagulability panel was ordered and can be followed up in the outpatient setting. Further detail for each issue is delineated below. 1. Left foot ischemia: Status post amputation. No convincing evidence of osteomyelitis by CT or MRI of the foot. Antibiotics discontinued. Leukocytosis resolved. Continue pain control. Physical therapy.    2. Diabetes mellitus type I, uncontrolled hemoglobin A1c 7.1: Stable. Continue Lantus. Sliding-scale insulin.   3. Acute renal failure: Resolved  4. Anemia: Stable. Has recently been transfused so anemia panel not likely to be of use. Likely postoperative in nature. Continue warfarin. 5. Stroke, embolic: Warfarin. Goal INR 2.5-3. No aspirin secondary to allergy. No Plavix as bleeding risk likely outweighs stroke prevention. MRI earlier in hospitalization showed multiple acute infarcts in different vascular distributions on the right suggestive of embolic phenomena. MRA did reveal moderate stenosis of both carotids and the cavernous portion of the vessel. Transesophageal echocardiogram was unrevealing.  Neurology recommended warfarin.  6. 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  7. Cocaine abuse  8. Cigarette smoker  Consultants:  Cardiology   Vascular surgery   Orthopedics   Physical medicine and rehabilitation   Physical therapy: Inpatient rehabilitation  Procedures:  March 21: PICC line placement   March 22: Lexiscan: No inducible or reversible ischemia.   March 28: Transesophageal echocardiogram: Left  ventricular ejection fraction 55-60%. No evidence of vegetation. No evidence of thrombus. Atrial septal aneurysm, otherwise essentially normal. No source of embolism identified.   March 29: EEG: mild generalized slowing, and no epileptiform activity.   April 4:  Left below the knee amputation  Discharge Instructions  Discharge Orders    Future Appointments: Provider: Department: Dept Phone: Center:   06/08/2011 2:30 PM [facility] [id number] [facility]   06/08/2011 3:00 PM Mal Misty, MD [facility] [phone] [facility]     Medication List  As of 04/27/2011  6:27 PM   STOP taking these medications         acetaminophen 500 MG tablet      amLODipine 2.5 MG tablet      cephALEXin 500 MG capsule      diphenhydrAMINE 25 MG tablet      insulin aspart 100 UNIT/ML injection      ondansetron 8 MG disintegrating tablet      oxyCODONE-acetaminophen 10-325 MG per tablet         TAKE these medications         ALPRAZolam 1 MG tablet   Commonly known as: XANAX   Take 1 tablet (1 mg total) by mouth daily as needed. For anxiety      feeding supplement Liqd   Take 1 Container by mouth 3 (three) times daily with meals.      fentaNYL 12 MCG/HR   Commonly known as: DURAGESIC - dosed mcg/hr   Place 1 patch (12.5 mcg total) onto the skin every 3 (three) days.      fish oil-omega-3 fatty acids 1000 MG capsule   Take 1 g by mouth daily.      furosemide 20 MG tablet   Commonly known as: LASIX   Take 20 mg by mouth 3 (three) times a week. On Mondays, Wednesdays, and Fridays      insulin glargine 100 UNIT/ML injection   Commonly known as: LANTUS   Inject 5 Units into the skin at bedtime.      multivitamins ther. w/minerals Tabs   Take 1 tablet by mouth daily.      oxyCODONE-acetaminophen 5-325 MG per tablet   Commonly known as: PERCOCET   Take 1-2 tablets by mouth every 4 (four) hours as needed.      potassium chloride SA 20 MEQ tablet   Commonly known as:  K-DUR,KLOR-CON   Take 0.5 tablets (10 mEq total) by mouth daily.      pravastatin 40 MG tablet   Commonly known as: PRAVACHOL   Take 1 tablet (40 mg total) by mouth daily.      sertraline 50 MG tablet   Commonly known as: ZOLOFT   Take 50 mg by mouth daily.      warfarin 3 MG tablet   Commonly known as: COUMADIN   Take 3 mg by mouth daily.           Follow-up Information    Follow up with HAWKINS,EDWARD L, MD in 1 week.      Follow up with DUDA,MARCUS V, MD in 3 weeks.   Contact information:   [place] [phone]       Follow up with Phillips Odor, MD in 4 weeks.   Contact information:   [address] [place] [phone]          The results of significant diagnostics from this hospitalization (including imaging, microbiology, ancillary and laboratory) are listed below for reference.    Significant Diagnostic Studies: Mr Virgel Paling [id number] Contrast  04/12/2011  *RADIOLOGY REPORT*  Clinical Data:  Confusion, diabetes, abnormal CT head.  MRI HEAD WITHOUT CONTRAST MRA HEAD WITHOUT CONTRAST  Technique:  Multiplanar, multiecho pulse sequences of the brain and surrounding structures were obtained without intravenous contrast. Angiographic images of the head were obtained using MRA technique without contrast.  Comparison:  CT head 04/12/2011  MRI HEAD  Findings:  Image quality degraded by motion.  There is motion on diffusion weighted imaging which was repeated.  Repeat imaging also has motion.  Despite motion, there is evidence of acute infarct in various vascular territories.  Small area acute infarct in the right inferior cerebellum.  Small area of acute infarct in the head of the caudate on the right and in the right putamen.  Small acute infarcts  in the right posterior temporal lobe and in the right parietal white matter.  Acute infarct also present in the right medial frontal lobe in the anterior  cerebral artery territory.  No acute infarct on the left.  Ventricle size is normal.  Negative for hemorrhage or mass lesion. No significant chronic ischemic changes are present.  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  These involve different vascular territories and are suggestive of arterial emboli.  MRA HEAD  Findings: Image quality degraded by motion.  The patient moved midway through the  the exam causing misregistration artifact.  Both vertebral arteries are patent to the basilar.  Left vertebral artery is patent.  PICA is patent bilaterally.  Basilar artery is patent.  Superior cerebellar and posterior cerebral arteries are patent bilaterally.  Limited visualization of the distal posterior cerebral arteries due to artifact from motion.  Internal carotid artery is patent bilaterally.  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 Anterior and middle cerebral arteries are patent but poorly visualized due to motion.  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  IMPRESSION: Limited study due to motion.  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 Hypoplastic left A1 segment.  Intracranial circulation not well evaluated on this study.  Original Report Authenticated By: Truett Perna, M.D.   Ct Foot Left Wo Contrast  04/06/2011  *RADIOLOGY REPORT*  Clinical Data: Diabetic with left foot pain.  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  Recent fall.  CT OF THE LEFT FOOT WITHOUT CONTRAST  Technique:  Multidetector CT imaging was performed according to the standard protocol. Multiplanar CT image reconstructions were also generated.  Comparison: Radiographs performed 04/05/2011.  No other prior studies available.  Findings: There is moderate generalized osteopenia.  Patient is  status post amputation of the fourth and fifth toes.  There are erosions of the fourth and fifth metatarsal heads.  The additional metatarsals and digits appear normal.  The alignment is normal at the Lisfranc joint.  There is no evidence of navicular fracture or cuboid erosion.  No focal hind foot abnormalities are seen.  There is subcutaneous edema throughout the foot.  No focal fluid collections are identified.  There are age advanced vascular calcifications.  No tendon abnormalities are seen.  IMPRESSION:  1.  No evidence of acute fracture or dislocation. 2.  Nonspecific erosions of the fourth and fifth metatarsal heads status post reported recent amputation of the corresponding toes. These erosions could be arthropathic, postsurgical or secondary to osteomyelitis.  Correlate clinically. 3.  Generalized osteopenia.  This limits osseous evaluation by CT.  MRI may be helpful for further evaluation if infection is a clinical concern.  Original Report Authenticated  By: Vivia Ewing, M.D.   Mr Foot Left Wo Contrast  04/07/2011  *RADIOLOGY REPORT*  Clinical Data: Left foot pain.  Abnormal CT scan.  Recent amputation of the fourth and fifth toes.  MRI OF THE LEFT FOREFOOT WITHOUT CONTRAST  Technique:  Multiplanar, multisequence MR imaging was performed. No intravenous contrast was administered.  Comparison: 04/06/2011  Findings: The the patient refused IV contrast administration.  I reviewed Dr. Jess Barters operative note from 03/03/2011.  Based on the note, the resection was at the level of the fourth and fifth metatarsal necks because there was inadequate healthy tissue overlying the amputation site to spare of the metatarsal heads. Accordingly, the absence of the distal metatarsal heads is an expected postoperative finding, and the low-level edema in the adjacent metatarsal neck is a reasonable expectation for 1 month postoperative appearance of resection at the level of the distal metatarsal neck.  Thus we do  not demonstrate specific indicators of osteomyelitis in the fourth and fifth digits.  There is low-level abnormal edema signal in the distal phalanx of the great toe which may represent osteomyelitis, for example on images five through seven of series 8.  Low-level edema in the plantar portion of the medial cuneiform is present, favoring mild stress reaction over infectious process.  Dorsal subcutaneous edema is present in the foot along with edema tracking along the plantar musculature of the foot.  IMPRESSION:  1.  The patient's prior amputation of the fourth and fifth digits were at the level of the metatarsal neck.  The small amount of residual edema in the adjacent marrow is compatible with a postoperative finding, and not specific for osteomyelitis. 2.  However, there is low-level abnormal edema in the distal phalanx of the great toe, which could represent mild osteomyelitis. This may warrant careful observation radiographically. 3.  Dorsal subcutaneous edema in the foot - cellulitis is not excluded.  There is also abnormal edema tracking along the plantar musculature of the foot possibly reflecting myositis. 4.  The patient refused IV contrast. 5.  Low-level edema inferiorly in the medial cuneiform favors a stress reaction over infection.  Original Report Authenticated By: Carron Curie, M.D.   US Carotid Duplex Bilateral  04/14/2011  *RADIOLOGY REPORT*  Clinical Data: Acute CVA.  BILATERAL CAROTID DUPLEX ULTRASOUND  Technique: Pearline Cables scale imaging, color Doppler and duplex ultrasound was performed of bilateral carotid and vertebral arteries in the neck.  Comparison:  MRI 04/12/2011  Criteria:  Quantification of carotid stenosis is based on velocity parameters that correlate the residual internal carotid diameter with NASCET-based stenosis levels, using the diameter of the distal internal carotid lumen as the denominator for stenosis measurement.  The following velocity measurements were obtained:                    PEAK SYSTOLIC/END DIASTOLIC RIGHT ICA:                        115cm/sec CCA:                        [id number] SYSTOLIC ICA/CCA RATIO:     1.1 DIASTOLIC ICA/CCA RATIO:    1.4 ECA:                        156cm/sec  LEFT ICA:  122cm/sec CCA:                        [id number] SYSTOLIC ICA/CCA RATIO:     0.8 DIASTOLIC ICA/CCA RATIO:    1.5 ECA:                        168cm/sec  Findings:  RIGHT CAROTID ARTERY: Intimal thickening in the right common carotid artery.  A small amount of plaque in the distal carotid bulb.  Minimal plaque in the proximal right internal carotid artery.  Normal velocities and waveforms in the right internal carotid artery.  RIGHT VERTEBRAL ARTERY:  Antegrade flow and normal waveform in the right vertebral artery.  LEFT CAROTID ARTERY: There is some intimal thickening in the left common carotid artery.  Left carotid arteries are patent without significant plaque or stenosis.  LEFT VERTEBRAL ARTERY:  Antegrade flow and normal waveform in the left vertebral artery.  IMPRESSION: Minimal atherosclerotic disease in the carotid arteries.  No significant carotid artery stenosis.  Estimated degree of stenosis in the internal carotid arteries is less than 50% bilaterally.  Original Report Authenticated By: Markus Daft, M.D.   Nm Myocar Single W/spect W/wall Motion And Ef  04/09/2011  *RADIOLOGY REPORT*  Clinical Data:  Abnormal EKG, chest discomfort  MYOCARDIAL IMAGING WITH SPECT (REST AND PHARMACOLOGIC-STRESS) GATED LEFT VENTRICULAR WALL MOTION STUDY LEFT VENTRICULAR EJECTION FRACTION  Technique:  Standard myocardial SPECT imaging was performed after resting intravenous injection of 12/10 mCi Tc-80m Myoview. Subsequently, intravenous infusion of Lexiscan was performed under the supervision of the Cardiology staff.  At peak effect of the drug, 30 mCi Tc-70m Myoview was injected intravenously and standard myocardial SPECT imaging was performed.  Quantitative gated  imaging was also performed to evaluate left ventricular wall motion and estimate left ventricular ejection fraction.  Comparison:  03/12/2011  Findings:  Spect:  Decreased activity of the anterior apical wall is more pronounced on the rest views than the stress more compatible with breast attenuation artifact.  No definite inducible or reversible ischemia with pharmacologic stress.  Wall motion:  Normal wall motion and thickening  Ejection fraction:  Calculated Q G S ejection fraction is 78%.  IMPRESSION: Breast attenuation of the anterior apical wall.  No definite inducible or reversible ischemia with pharmacologic stress  78% ejection fraction  Original Report Authenticated By: Jerilynn Mages. Daryll Brod, M.D.   Microbiology: Recent Results (from the past 240 hour(s))  SURGICAL PCR SCREEN     Status: Normal   Collection Time   04/22/11 12:40 AM      Component Value Range Status Comment   MRSA, PCR NEGATIVE  NEGATIVE  Final    Staphylococcus aureus NEGATIVE  NEGATIVE  Final     Labs: Basic Metabolic Panel:  Lab [id number] 04/23/11 0455 04/21/11 0615 04/20/11 2220  NA 141 143 138 138  K 4.4 4.6 -- --  CL 112 116* 110 108  CO2 15* 19 19 20   GLUCOSE 166* 150* 414* 283*  BUN 14 8 9 7   CREATININE 1.19* 1.26* 1.43* 1.34*  CALCIUM 8.6 7.4* 6.9* 7.0*  MG -- -- -- --  PHOS -- -- -- --   Liver Function Tests:  Lab 04/20/11 2220  AST 11  ALT 8  ALKPHOS 93  BILITOT <0.1*  PROT 4.6*  ALBUMIN 0.6*   CBC:  Lab 04/27/11 0606 04/26/11 0515 04/24/11 0544 04/23/11 0455 04/20/11 2220  WBC 9.7 13.5* 16.7* 16.0* 16.8*  NEUTROABS -- -- -- -- --  HGB 8.0* 8.6* 8.3* 8.9* 9.0*  HCT 25.0* 27.0* 25.2* 27.1* 26.8*  MCV 90.9 94.7 90.6 92.2 88.7  PLT 681* 774* 675* 739* 734*   CBG:  Lab 04/27/11 1628 04/27/11 1129 04/27/11 0658 04/26/11 2148 04/26/11 1631  GLUCAP 297* 224* 145* 182* 196*      Signed:  Murray Hodgkins, MD  [facility] 04/27/2011, 6:09 PM

## 2011-04-27 NOTE — Progress Notes (Signed)
   CARE MANAGEMENT NOTE 04/27/2011  Patient:  [name], [name]   Account Number:  [id number]  Date Initiated:  04/07/2011  Documentation initiated by:  Theophilus Kinds  Subjective/Objective Assessment:   Pt admitted from home with recurrent cellulitis. 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 Possible osteomylitis. Pt lives with mother and fiance. Pt is able to afford meds. Active with [facility].     Action/Plan:   CM spoke with pt about possible need for IV AB at discharge. Pain control is also an issue at this point.   Anticipated DC Date:  04/14/2011   Anticipated DC Plan:  [place]  In-house referral  Clinical Social Worker      DC Planning Services  CM consult      Choice offered to / List presented to:             [place].   Status of service:  In process, will continue to follow Medicare Important Message given?   (If response is "NO", the following Medicare IM given date fields will be blank) Date Medicare IM given:   Date Additional Medicare IM given:    Discharge Disposition:    Per UR Regulation:  Reviewed for med. necessity/level of care/duration of stay  If discussed at [place] of Stay Meetings, dates discussed:   04/15/2011    Comments:  04/27/11 [name], RN, BSN 1407 UR COMPLETED  04/23/11 [name], RN, BSN 1157 UR COMPLETED.  PT IS BEING EVALUATED BY CIR FOR INPT REHAB.  04/21/11 JENNFIER Shaketta Rill, RN, BSN 1631 PT WAS IN AGREEMENT OF REHAB AT DC.  CIR HAS DONE A DRIVE BY AND WANTS TO MAKE SURE AFTER BKA TO ORDER PT/OT EVAL AND GET A CONSULT IF IT IS RECOMMENDED.  WILL F/U.  PT IS AT HOME WITH FAMILY.  04/20/2011  [address], [place] Met with patient for CM introduction, will continue to follow for discharge planning needs.  04/20/11 [name], BSN [id number] [place], AT [facility]  04/16/11 [place], RN BSN CM CM spoke with pts daughter, Vikki Ports, who  states that pt is now for SNF. At this point, the daughter is unable to take care of pt and pts mother who has dementia. However, pt daughter states that she only wants this short term until her mother can get IV AB and PT. MD and CSW is aware. CSW will complete paperwork and look for SNF bed.  04/09/11 [place], RN BSN CM Discussed the need for short term placement for IV AB therapy as recommended by Dr. Luan Pulling. Pt is agreeable. CSW made aware.  04/07/11 [place], RN BSN CM Pt admitted from home with cellulitis and possible osteomylitis. Pt already active with [facility]. Will resume services at discharge

## 2011-04-27 NOTE — Progress Notes (Signed)
Patient ID: [name], female   DOB: 12-Feb-1970, 41 y.o.   MRN: [id number] Patient resting comfortably. I did review with her the importance of working on knee extension to prevent knee contraction. Patient needs to work on knee extension with therapy.

## 2011-04-28 LAB — CARDIOLIPIN ANTIBODIES, IGG, IGM, IGA
Anticardiolipin IgA: 6 APL U/mL — ABNORMAL LOW (ref <22)
Anticardiolipin IgG: 7 GPL U/mL — ABNORMAL LOW (ref <23)
Anticardiolipin IgM: 8 MPL U/mL — ABNORMAL LOW (ref <11)

## 2011-04-28 LAB — GLUCOSE, CAPILLARY
Glucose-Capillary: 160 mg/dL — ABNORMAL HIGH (ref 70–99)
Glucose-Capillary: 138 mg/dL — ABNORMAL HIGH (ref 70–99)

## 2011-04-28 LAB — BETA-2-GLYCOPROTEIN I ABS, IGG/M/A
Beta-2 Glyco I IgG: 1 G Units (ref <20)
Beta-2-Glycoprotein I IgA: 4 A Units (ref <20)
Beta-2-Glycoprotein I IgM: 10 M Units (ref <20)

## 2011-04-28 LAB — PROTIME-INR
INR: 2.94 — ABNORMAL HIGH (ref 0.00–1.49)
Prothrombin Time: 31.1 seconds — ABNORMAL HIGH (ref 11.6–15.2)

## 2011-04-28 NOTE — Progress Notes (Signed)
Clinical Social Work  CSW received dc orders. CSW faxed dc summary to SNF ([facility]) and prepared dc packet. SNF agreeable to admission. CSW met with patient and explained dc. Patient was agreeable to dc.  Per RN, patient is ready to dc at 1330. CSW coordinated transportation via [place].  CSW is signing off.   [place], [place] [phone]

## 2011-04-28 NOTE — Progress Notes (Signed)
Pt D/C to SNF via EMS.

## 2011-04-28 NOTE — Progress Notes (Signed)
[name] [id number] is a 41 y.o. female,  Outpatient Primary MD for the patient is Alonza Bogus, MD, MD  Chief Complaint  Patient presents with  . Hyperglycemia        Subjective:   [name] today has, No headache, No chest pain, No abdominal pain - No Nausea, No new weakness tingling or numbness, No Cough - SOB. Since left ankle pain however left leg has recently been amputated.  Objective:   Filed Vitals:   04/27/11 1400 04/28/11 0645 04/28/11 0800 04/28/11 1232  BP: 147/76 138/75 136/74 120/70  Pulse: 83 78 80 81  Temp: 98 F (36.7 C) 98.3 F (36.8 C) 98.2 F (36.8 C) 99.7 F (37.6 C)  TempSrc:   Oral Oral  Resp: 16 18 14 16   Height:      Weight:      SpO2: 94% 96% 97% 97%    Wt Readings from Last 3 Encounters:  04/22/11 68.312 kg (150 lb 9.6 oz)  04/22/11 68.312 kg (150 lb 9.6 oz)  04/22/11 68.312 kg (150 lb 9.6 oz)     Intake/Output Summary (Last 24 hours) at 04/28/11 1332 Last data filed at 04/28/11 1044  Gross per 24 hour  Intake      0 ml  Output    200 ml  Net   -200 ml    Exam Awake Alert, Oriented *3, No new F.N deficits, Normal affect [name],[name],[name], [name]  Symmetrical Chest wall movement, Good air movement bilaterally, CTAB RRR,No Gallops,Rubs or new Murmurs, No Parasternal Heave +ve B.Sounds, Abd Soft, Non tender, No organomegaly appriciated, No rebound -guarding or rigidity. No Cyanosis, Clubbing or edema, No new Rash or bruise, left stump in bandage.  Data Review  CBC  Lab 04/27/11 0606 04/26/11 0515 04/24/11 0544 04/23/11 0455  WBC 9.7 13.5* 16.7* 16.0*  HGB 8.0* 8.6* 8.3* 8.9*  HCT 25.0* 27.0* 25.2* 27.1*  PLT 681* 774* 675* 739*  MCV 90.9 94.7 90.6 92.2  MCH 29.1 30.2 29.9 30.3  MCHC 32.0 31.9 32.9 32.8  RDW 16.2* 16.3* 15.5 15.6*  LYMPHSABS -- -- -- --  MONOABS -- -- -- --  EOSABS -- -- -- --  BASOSABS -- -- -- --  BANDABS -- -- -- --     Chemistries   Lab 04/26/11 0515 04/23/11 0455  NA 141 143  K 4.4 4.6  CL 112 116*  CO2 15* 19  GLUCOSE 166* 150*  BUN 14 8  CREATININE 1.19* 1.26*  CALCIUM 8.6 7.4*  MG -- --  AST -- --  ALT -- --  ALKPHOS -- --  BILITOT -- --   ------------------------------------------------------------------------------------------------------------------ estimated creatinine clearance is 55 ml/min (by C-G formula based on Cr of 1.19). ------------------------------------------------------------------------------------------------------------------ No results found for this basename: HGBA1C:2 in the last 72 hours ------------------------------------------------------------------------------------------------------------------ No results found for this basename: CHOL:2,HDL:2,LDLCALC:2,TRIG:2,CHOLHDL:2,LDLDIRECT:2 in the last 72 hours ------------------------------------------------------------------------------------------------------------------ No results found for this basename: TSH,T4TOTAL,FREET3,T3FREE,THYROIDAB in the last 72 hours ------------------------------------------------------------------------------------------------------------------ No results found for this basename: VITAMINB12:2,FOLATE:2,FERRITIN:2,TIBC:2,IRON:2,RETICCTPCT:2 in the last 72 hours  Coagulation profile  Lab 04/28/11 0510 04/27/11 0606 04/26/11 0515 04/25/11 0620 04/24/11 0544  INR 2.94* 3.89* 2.16* 2.02* 1.77*  PROTIME -- -- -- -- --    No results found for this basename: DDIMER:2 in the last 72 hours  Cardiac Enzymes No results found for this basename: CK:3,CKMB:3,TROPONINI:3,MYOGLOBIN:3 in the last 168 hours ------------------------------------------------------------------------------------------------------------------ No components found with this basename: POCBNP:3  Micro Results Recent Results (from the past 240  hour(s))  SURGICAL PCR SCREEN     Status: Normal   Collection Time   04/22/11  12:40 AM      Component Value Range Status Comment   MRSA, PCR NEGATIVE  NEGATIVE  Final    Staphylococcus aureus NEGATIVE  NEGATIVE  Final     Radiology Reports Dg Chest 2 View  04/21/2011  *RADIOLOGY REPORT*  Clinical Data: Shortness of breath.  Preop for for the patient.  CHEST - 2 VIEW  Comparison: Chest x-ray 01/30/2011.  Findings: The right PICC line is in good position with the tip in the distal SVC.  The cardiac silhouette, mediastinal and hilar contours are stable.  The lungs are clear. There are small pleural effusions.  The bony thorax is intact.  IMPRESSION:  1.  Right PICC line tip in good position. 2.  Small bilateral pleural effusions.  Original Report Authenticated By: P. Kalman Jewels, M.D.   Dg Hip Complete Left  04/15/2011  *RADIOLOGY REPORT*  Clinical Data: Left hip pain following a fall.  LEFT HIP - COMPLETE 2+ VIEW  Comparison: None.  Findings: Normal appearing left hip and pelvic bones.  No fracture or dislocation.  IMPRESSION: Normal examination.  Original Report Authenticated By: Gerald Stabs, M.D.   Dg Femur Left  04/05/2011  *RADIOLOGY REPORT*  Clinical Data: Status post fall.  Pain.  LEFT FEMUR - 2 VIEW  Comparison: None.  Findings: No acute bony or joint abnormality is identified.  The hip and knee are located.  Soft tissues are unremarkable.  IMPRESSION: Negative exam.  Original Report Authenticated By: Arvid Right. D'ALESSIO, M.D.   Dg Tibia/fibula Left  04/05/2011  *RADIOLOGY REPORT*  Clinical Data: Recent fall.  LEFT TIBIA AND FIBULA - 2 VIEW  Comparison: None.  Findings: There are no fractures or dislocations.  The bones appear intrinsically normal as do the soft tissues.  IMPRESSION: Negative study.  Original Report Authenticated By: Altamese Cabal, M.D.   Ct Head Wo Contrast  04/12/2011  *RADIOLOGY REPORT*  Clinical Data: Confusion with hallucinations.  History of diabetes.  CT HEAD WITHOUT CONTRAST  Technique:  Contiguous axial images were obtained from the  base of the skull through the vertex without contrast.  Comparison: 11/14/2007.  Findings: There is no acute hemorrhage, or mass lesion.  There is an asymmetric hypodensity affecting the right MCA/lenticulostriate territory involving the right head of caudate nucleus and adjacent white matter.  There is no hemorrhage or mass effect.  This could represent an acute or subacute infarct. Similar hypodensities affects the right anterior frontal and right medial frontal parasagittal cortex (arrows);  right internal carotid artery territory acute or subacute infarction not excluded. If there are no contraindications, consider MRI for further evaluation.  No hydrocephalus or extra-axial fluid.  Mild baseline atrophy similar to 2009 scan.  Mild chronic microvascular ischemic change. Intact calvarium.  Clear sinuses and mastoids.  Moderate vascular calcification.  IMPRESSION: Findings suggesting acute or subacute ischemia in the right ICA territory, affecting the right frontal lobe and right basal ganglia, without hemorrhage or mass lesion.  Consider MRI brain without contrast as well as MRA intracranial for further evaluation if there are no contraindications. .  Original Report Authenticated By: Staci Righter, M.D.   Mr [facility] Wo Contrast  04/12/2011  *RADIOLOGY REPORT*  Clinical Data:  Confusion, diabetes, abnormal CT head.  MRI HEAD WITHOUT CONTRAST MRA HEAD WITHOUT CONTRAST  Technique:  Multiplanar, multiecho pulse sequences of the brain and surrounding structures were obtained without  intravenous contrast. Angiographic images of the head were obtained using MRA technique without contrast.  Comparison:  CT head 04/12/2011  MRI HEAD  Findings:  Image quality degraded by motion.  There is motion on diffusion weighted imaging which was repeated.  Repeat imaging also has motion.  Despite motion, there is evidence of acute infarct in various vascular territories.  Small area acute infarct in the right inferior  cerebellum.  Small area of acute infarct in the head of the caudate on the right and in the right putamen.  Small acute infarcts  in the right posterior temporal lobe and in the right parietal white matter.  Acute infarct also present in the right medial frontal lobe in the anterior cerebral artery territory.  No acute infarct on the left.  Ventricle size is normal.  Negative for hemorrhage or mass lesion. No significant chronic ischemic changes are present.  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  These involve different vascular territories and are suggestive of arterial emboli.  MRA HEAD  Findings: Image quality degraded by motion.  The patient moved midway through the  the exam causing misregistration artifact.  Both vertebral arteries are patent to the basilar.  Left vertebral artery is patent.  PICA is patent bilaterally.  Basilar artery is patent.  Superior cerebellar and posterior cerebral arteries are patent bilaterally.  Limited visualization of the distal posterior cerebral arteries due to artifact from motion.  Internal carotid artery is patent bilaterally.  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 Anterior and middle cerebral arteries are patent but poorly visualized due to motion.  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  IMPRESSION: Limited study due to motion.  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 Hypoplastic left A1 segment.  Intracranial circulation not well evaluated on this study.  Original Report Authenticated By: Truett Perna, M.D.   Mr Brain Wo Contrast  04/12/2011  *RADIOLOGY REPORT*  Clinical Data:  Confusion, diabetes, abnormal CT head.  MRI HEAD WITHOUT CONTRAST MRA HEAD WITHOUT CONTRAST  Technique:  Multiplanar, multiecho pulse sequences of the brain and  surrounding structures were obtained without intravenous contrast. Angiographic images of the head were obtained using MRA technique without contrast.  Comparison:  CT head 04/12/2011  MRI HEAD  Findings:  Image quality degraded by motion.  There is motion on diffusion weighted imaging which was repeated.  Repeat imaging also has motion.  Despite motion, there is evidence of acute infarct in various vascular territories.  Small area acute infarct in the right inferior cerebellum.  Small area of acute infarct in the head of the caudate on the right and in the right putamen.  Small acute infarcts  in the right posterior temporal lobe and in the right parietal white matter.  Acute infarct also present in the right medial frontal lobe in the anterior cerebral artery territory.  No acute infarct on the left.  Ventricle size is normal.  Negative for hemorrhage or mass lesion. No significant chronic ischemic changes are present.  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  These involve different vascular territories and are suggestive of arterial emboli.  MRA HEAD  Findings: Image quality degraded by motion.  The patient moved midway through the  the exam causing misregistration artifact.  Both vertebral arteries are patent to the basilar.  Left vertebral artery is patent.  PICA is patent bilaterally.  Basilar artery is patent.  Superior cerebellar and posterior cerebral arteries are patent bilaterally.  Limited visualization of the distal posterior cerebral arteries due to artifact from motion.  Internal carotid artery is patent bilaterally.  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 Anterior and middle cerebral arteries are patent but poorly visualized due to motion.  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  IMPRESSION: Limited study due to motion.   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 Hypoplastic left A1 segment.  Intracranial circulation not well evaluated on this study.  Original Report Authenticated By: Truett Perna, M.D.   Ct Foot Left Wo Contrast  04/06/2011  *RADIOLOGY REPORT*  Clinical Data: Diabetic with left foot pain.  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  Recent fall.  CT OF THE LEFT FOOT WITHOUT CONTRAST  Technique:  Multidetector CT imaging was performed according to the standard protocol. Multiplanar CT image reconstructions were also generated.  Comparison: Radiographs performed 04/05/2011.  No other prior studies available.  Findings: There is moderate generalized osteopenia.  Patient is status post amputation of the fourth and fifth toes.  There are erosions of the fourth and fifth metatarsal heads.  The additional metatarsals and digits appear normal.  The alignment is normal at the Lisfranc joint.  There is no evidence of navicular fracture or cuboid erosion.  No focal hind foot abnormalities are seen.  There is subcutaneous edema throughout the foot.  No focal fluid collections are identified.  There are age advanced vascular calcifications.  No tendon abnormalities are seen.  IMPRESSION:  1.  No evidence of acute fracture or dislocation. 2.  Nonspecific erosions of the fourth and fifth metatarsal heads status post reported recent amputation of the corresponding toes. These erosions could be arthropathic, postsurgical or secondary to osteomyelitis.  Correlate clinically. 3.  Generalized osteopenia.  This limits osseous evaluation by CT.  MRI may be helpful for further evaluation if infection is a clinical concern.  Original Report Authenticated By: Vivia Ewing, M.D.   Mr Foot Left Wo Contrast  04/07/2011  *RADIOLOGY REPORT*  Clinical Data: Left foot pain.  Abnormal CT scan.  Recent amputation of the fourth and fifth toes.  MRI OF THE LEFT FOREFOOT  WITHOUT CONTRAST  Technique:  Multiplanar, multisequence MR imaging was performed. No intravenous contrast was administered.  Comparison: 04/06/2011  Findings: The the patient refused IV contrast administration.  I reviewed Dr. Jess Barters operative note from 03/03/2011.  Based on the note, the resection was at the level of the fourth and fifth metatarsal necks because there was inadequate healthy tissue overlying the amputation site to spare of the metatarsal heads. Accordingly, the absence of the distal metatarsal heads is an expected postoperative finding, and the low-level edema in the adjacent metatarsal neck is a reasonable expectation for 1 month postoperative appearance of resection at the level of the distal metatarsal neck.  Thus we do not demonstrate specific indicators of osteomyelitis in the fourth and fifth digits.  There is low-level abnormal edema signal in the distal phalanx of the great toe which may represent osteomyelitis, for example on images five through seven of series 8.  Low-level edema in the plantar portion of the medial cuneiform is present, favoring mild stress reaction over infectious process.  Dorsal subcutaneous edema is  present in the foot along with edema tracking along the plantar musculature of the foot.  IMPRESSION:  1.  The patient's prior amputation of the fourth and fifth digits were at the level of the metatarsal neck.  The small amount of residual edema in the adjacent marrow is compatible with a postoperative finding, and not specific for osteomyelitis. 2.  However, there is low-level abnormal edema in the distal phalanx of the great toe, which could represent mild osteomyelitis. This may warrant careful observation radiographically. 3.  Dorsal subcutaneous edema in the foot - cellulitis is not excluded.  There is also abnormal edema tracking along the plantar musculature of the foot possibly reflecting myositis. 4.  The patient refused IV contrast. 5.  Low-level edema  inferiorly in the medial cuneiform favors a stress reaction over infection.  Original Report Authenticated By: Carron Curie, M.D.   US Carotid Duplex Bilateral  04/14/2011  *RADIOLOGY REPORT*  Clinical Data: Acute CVA.  BILATERAL CAROTID DUPLEX ULTRASOUND  Technique: Pearline Cables scale imaging, color Doppler and duplex ultrasound was performed of bilateral carotid and vertebral arteries in the neck.  Comparison:  MRI 04/12/2011  Criteria:  Quantification of carotid stenosis is based on velocity parameters that correlate the residual internal carotid diameter with NASCET-based stenosis levels, using the diameter of the distal internal carotid lumen as the denominator for stenosis measurement.  The following velocity measurements were obtained:                   PEAK SYSTOLIC/END DIASTOLIC RIGHT ICA:                        115cm/sec CCA:                        [id number] SYSTOLIC ICA/CCA RATIO:     1.1 DIASTOLIC ICA/CCA RATIO:    1.4 ECA:                        156cm/sec  LEFT ICA:                        122cm/sec CCA:                        [id number] SYSTOLIC ICA/CCA RATIO:     0.8 DIASTOLIC ICA/CCA RATIO:    1.5 ECA:                        168cm/sec  Findings:  RIGHT CAROTID ARTERY: Intimal thickening in the right common carotid artery.  A small amount of plaque in the distal carotid bulb.  Minimal plaque in the proximal right internal carotid artery.  Normal velocities and waveforms in the right internal carotid artery.  RIGHT VERTEBRAL ARTERY:  Antegrade flow and normal waveform in the right vertebral artery.  LEFT CAROTID ARTERY: There is some intimal thickening in the left common carotid artery.  Left carotid arteries are patent without significant plaque or stenosis.  LEFT VERTEBRAL ARTERY:  Antegrade flow and normal waveform in the left vertebral artery.  IMPRESSION: Minimal atherosclerotic disease in the carotid arteries.  No significant carotid artery stenosis.  Estimated degree of stenosis in the internal  carotid arteries is less than 50% bilaterally.  Original Report Authenticated By: Markus Daft, M.D.   Nm Myocar Single W/spect W/wall Motion And Ef  04/09/2011  *RADIOLOGY  REPORT*  Clinical Data:  Abnormal EKG, chest discomfort  MYOCARDIAL IMAGING WITH SPECT (REST AND PHARMACOLOGIC-STRESS) GATED LEFT VENTRICULAR WALL MOTION STUDY LEFT VENTRICULAR EJECTION FRACTION  Technique:  Standard myocardial SPECT imaging was performed after resting intravenous injection of 12/10 mCi Tc-4m Myoview. Subsequently, intravenous infusion of Lexiscan was performed under the supervision of the Cardiology staff.  At peak effect of the drug, 30 mCi Tc-68m Myoview was injected intravenously and standard myocardial SPECT imaging was performed.  Quantitative gated imaging was also performed to evaluate left ventricular wall motion and estimate left ventricular ejection fraction.  Comparison:  03/12/2011  Findings:  Spect:  Decreased activity of the anterior apical wall is more pronounced on the rest views than the stress more compatible with breast attenuation artifact.  No definite inducible or reversible ischemia with pharmacologic stress.  Wall motion:  Normal wall motion and thickening  Ejection fraction:  Calculated Q G S ejection fraction is 78%.  IMPRESSION: Breast attenuation of the anterior apical wall.  No definite inducible or reversible ischemia with pharmacologic stress  78% ejection fraction  Original Report Authenticated By: Jerilynn Mages. Daryll Brod, M.D.   US Venous Img Lower Unilateral Left  03/31/2011  *RADIOLOGY REPORT*  Clinical Data: Lower extremity pain, left leg pain and swelling  LEFT LOWER EXTREMITY VENOUS DUPLEX ULTRASOUND  Technique:  Gray-scale sonography with graded compression, as well as color Doppler and duplex ultrasound were performed to evaluate the deep venous system of the lower extremity from the level of the common femoral vein through the popliteal and proximal calf veins. Spectral Doppler was utilized  to evaluate flow at rest and with distal augmentation maneuvers.  Comparison:  None.  Findings:  Normal compressibility of the common femoral, superficial femoral, and popliteal veins is demonstrated, as well as the visualized proximal calf veins.  No filling defects to suggest DVT on grayscale or color Doppler imaging.  Doppler waveforms show normal direction of venous flow, normal respiratory phasicity and response to augmentation. There is subcutaneous edema within the lower extremity.  There is an ill-defined fluid collection medial left knee joint does not appear to represent an abscess.  IMPRESSION: No evidence of lower extremity deep vein thrombosis.  Fluid collection medial to the left knee joint  Original Report Authenticated By: Suzy Bouchard, M.D.   US Venous Img Upper Uni Right  03/31/2011  *RADIOLOGY REPORT*  Clinical data:  Right upper extremity pain and swelling.  Numbness in the fingers of the right hand.  RIGHT UPPER EXTREMITY VENOUS DOPPLER ULTRASOUND 03/31/2011:  Technique:  Gray-scale sonography with compression, as well as color and duplex Doppler ultrasound, were performed to evaluate the deep venous system from the level of  the elbow centrally to include the subclavian vein.  The right internal jugular vein was also evaluated.  Evaluation also included physiologic response to augmentation.  Comparison: None.  Findings: All of the visualized deep veins in the right upper extremity demonstrate normal Doppler signal, normal compressibility, normal phasicity, and normal physiologic response to augmentation.  No gray scale filling defects were identified. The right internal jugular vein is similarly patent.  Note is made of sluggish flow in the brachial and axillary veins.  IMPRESSION: No evidence of right upper extremity DVT.  Original Report Authenticated By: Deniece Portela, M.D.   Ir US Guide Vasc Access Right  04/08/2011  *RADIOLOGY REPORT*  Clinical Data: Diabetic infected fOOT,  access for antibiotics  PICC LINE PLACEMENT WITH ULTRASOUND AND FLUOROSCOPIC  GUIDANCE  Fluoroscopy Time: 0.1  minutes.  The right arm was prepped with chlorhexidine, draped in the usual sterile fashion using maximum barrier technique (cap and mask, sterile gown, sterile gloves, large sterile sheet, hand hygiene and cutaneous antisepsis) and infiltrated locally with 1% Lidocaine.  Ultrasound demonstrated patency of the right basilic vein, and this was documented with an image.  Under real-time ultrasound guidance, this vein was accessed with a 21 gauge micropuncture needle and image documentation was performed.  The needle was exchanged over a guidewire for a peel-away sheath through which a 5 [place] double lumen PICC trimmed to 24 cm was advanced, positioned with its tip at the lower SVC/right atrial junction.  Fluoroscopy during the procedure and fluoro spot radiograph confirms appropriate catheter position.  The catheter was flushed, secured to the skin with Prolene sutures, and covered with a sterile dressing.  Complications:  No immediate  IMPRESSION: Successful right arm PICC line placement with ultrasound and fluoroscopic guidance.  The catheter is ready for use.  Original Report Authenticated By: Jerilynn Mages. Daryll Brod, M.D.   Dg Humerus Right  04/05/2011  *RADIOLOGY REPORT*  Clinical Data: Recent fall, hyperglycemia  RIGHT HUMERUS - 2+ VIEW  Comparison: None.  Findings: No acute fracture is seen.  The right shoulder is unremarkable other than a somewhat downward sloping acromion.  IMPRESSION: No acute fracture.  Original Report Authenticated By: Joretta Bachelor, M.D.   Dg Foot Complete Left  04/05/2011  *RADIOLOGY REPORT*  Clinical Data: History of blood clot left leg.  Fall.  LEFT FOOT - COMPLETE 3+ VIEW  Comparison: None.  Findings: There is been previous amputation of the left fourth and fifth toes at the metatarsal phalangeal joint level. There is an erosion associated with the dorsal portion of the cuboid  bone seen on the lateral view. There is a possible subtle fracture of the medial aspect of the navicular bone versus variant anatomy. Recommend correlation with physical examination findings and possibly CT.  IMPRESSION: Previous amputations of the left fourth and fifth toes at the metatarsophalangeal joint levels. Possible fracture of the navicular bone versus variant anatomy.  Suggest correlation with physical examination findings and possibly CT if felt to be indicated. Erosion associated with the cuboid bone as discussed above.  Original Report Authenticated By: Altamese Cabal, M.D.   Ir Fluoro Guide Cv Midline Picc Right  04/08/2011  *RADIOLOGY REPORT*  Clinical Data: Diabetic infected fOOT, access for antibiotics  PICC LINE PLACEMENT WITH ULTRASOUND AND FLUOROSCOPIC  GUIDANCE  Fluoroscopy Time: 0.1 minutes.  The right arm was prepped with chlorhexidine, draped in the usual sterile fashion using maximum barrier technique (cap and mask, sterile gown, sterile gloves, large sterile sheet, hand hygiene and cutaneous antisepsis) and infiltrated locally with 1% Lidocaine.  Ultrasound demonstrated patency of the right basilic vein, and this was documented with an image.  Under real-time ultrasound guidance, this vein was accessed with a 21 gauge micropuncture needle and image documentation was performed.  The needle was exchanged over a guidewire for a peel-away sheath through which a 5 [place] double lumen PICC trimmed to 24 cm was advanced, positioned with its tip at the lower SVC/right atrial junction.  Fluoroscopy during the procedure and fluoro spot radiograph confirms appropriate catheter position.  The catheter was flushed, secured to the skin with Prolene sutures, and covered with a sterile dressing.  Complications:  No immediate  IMPRESSION: Successful right arm PICC line placement with ultrasound and fluoroscopic guidance.  The catheter is ready for use.  Original Report Authenticated By:  M. Daryll Brod, M.D.    Scheduled Meds:   . feeding supplement  1 Container Oral TID WC  . fentaNYL  12.5 mcg Transdermal Q72H  . furosemide  40 mg Oral Daily  . insulin aspart  0-15 Units Subcutaneous TID WC  . insulin aspart  0-5 Units Subcutaneous QHS  . insulin glargine  5 Units Subcutaneous QHS  . mulitivitamin with minerals  1 tablet Oral Daily  . omega-3 acid ethyl esters  1 g Oral Daily  . pantoprazole  40 mg Oral Q1200  . potassium chloride SA  20 mEq Oral TID  . sertraline  50 mg Oral Daily  . simvastatin  20 mg Oral q1800  . Warfarin - Pharmacist Dosing Inpatient   Does not apply q1800   Continuous Infusions:  PRN Meds:.acetaminophen, ALPRAZolam, dextrose, dextrose, HYDROmorphone, metoCLOPramide (REGLAN) injection, metoCLOPramide, ondansetron (ZOFRAN) IV, ondansetron, ondansetron, oxyCODONE-acetaminophen, sodium chloride  Assessment & Plan   Patient to be discharged as present distress evident by Dr. 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 She remains afebrile with stable vital signs. To be discharged to nursing facility.   Thurnell Lose M.D on 04/28/2011 at 1:32 PM  [facility] Office  [phone]

## 2011-04-28 NOTE — Discharge Instructions (Signed)
STROKE/TIA DISCHARGE INSTRUCTIONS SMOKING Cigarette smoking nearly doubles your risk of having a stroke & is the single most alterable risk factor  If you smoke or have smoked in the last 12 months, you are advised to quit smoking for your health.  Most of the excess cardiovascular risk related to smoking disappears within a year of stopping.  Ask you doctor about anti-smoking medications  [place] Quit Line: 1-800-QUIT NOW  Free Smoking Cessation Classes ([phone]  CHOLESTEROL Know your levels; limit fat & cholesterol in your diet  Lipid Panel     Component Value Date/Time   CHOL  Value: 228        ATP III CLASSIFICATION:  <200     mg/dL   Desirable  200-239  mg/dL   Borderline High  >=240    mg/dL   High       * 03/30/2009 0547   TRIG 194* 03/30/2009 0547   HDL 45 03/30/2009 0547   CHOLHDL 5.1 03/30/2009 0547   VLDL 39 03/30/2009 0547   LDLCALC  Value: 144        Total Cholesterol/HDL:CHD Risk Coronary Heart Disease Risk Table                     Men   Women  1/2 Average Risk   3.4   3.3  Average Risk       5.0   4.4  2 X Average Risk   9.6   7.1  3 X Average Risk  23.4   11.0        Use the calculated Patient Ratio above and the CHD Risk Table to determine the patient's CHD Risk.        ATP III CLASSIFICATION (LDL):  <100     mg/dL   Optimal  100-129  mg/dL   Near or Above                    Optimal  130-159  mg/dL   Borderline  160-189  mg/dL   High  >190     mg/dL   Very High* 03/30/2009 0547      Many patients benefit from treatment even if their cholesterol is at goal.  Goal: Total Cholesterol (CHOL) less than 160  Goal:  Triglycerides (TRIG) less than 150  Goal:  HDL greater than 40  Goal:  LDL (LDLCALC) less than 100   BLOOD PRESSURE American Stroke Association blood pressure target is less that 120/80 mm/Hg  Your discharge blood pressure is:  BP: 101/76 mmHg  Monitor your blood pressure  Limit your salt and alcohol intake  Many individuals will require more than one  medication for high blood pressure  DIABETES (A1c is a blood sugar average for last 3 months) Goal HGBA1c is under 7% (HBGA1c is blood sugar average for last 3 months)  Diabetes: {STROKE DC DIABETES:22357}    Lab Results  Component Value Date   HGBA1C 8.0* 02/11/2011     Your HGBA1c can be lowered with medications, healthy diet, and exercise.  Check your blood sugar as directed by your physician  Call your physician if you experience unexplained or low blood sugars.  PHYSICAL ACTIVITY/REHABILITATION Goal is 30 minutes at least 4 days per week    {STROKE DC ACTIVITY/REHAB:22359}  Activity decreases your risk of heart attack and stroke and makes your heart stronger.  It helps control your weight and blood pressure; helps you relax and can improve your mood.  Participate in a regular exercise program.  Talk with your doctor about the best form of exercise for you (dancing, walking, swimming, cycling).  DIET/WEIGHT Goal is to maintain a healthy weight  Your discharge diet is: Carb Control *** liquids Your height is:  Height: 5\' 1"  (154.9 cm) Your current weight is: Weight: 67.8 kg (149 lb 7.6 oz) Your Body Mass Index (BMI) is:  BMI (Calculated): 27.4   Following the type of diet specifically designed for you will help prevent another stroke.  Your goal weight range is:  ***  Your goal Body Mass Index (BMI) is 19-24.  Healthy food habits can help reduce 3 risk factors for stroke:  High cholesterol, hypertension, and excess weight.  RESOURCES Stroke/Support Group:  Call [phone]  they meet the 3rd Sunday of the month on the Rehab Unit at [facility], [place] ( no meetings June, July & Aug).  STROKE EDUCATION PROVIDED/REVIEWED AND GIVEN TO PATIENT Stroke warning signs and symptoms How to activate emergency medical system (call 911). Medications prescribed at discharge. Need for follow-up after discharge. Personal risk factors for stroke. Pneumonia vaccine given:   {STROKE DC  YES/NO/DATE:22363} Flu vaccine given:   {STROKE DC YES/NO/DATE:22363} My questions have been answered, the writing is legible, and I understand these instructions.  I will adhere to these goals & educational materials that have been provided to me after my discharge from the hospital.   Dry dressing change transtibial amputation when necessary. Okay to get incision wet with soap and water. Patient needs to work on extension exercises to prevent flexion contracture.

## 2011-06-07 ENCOUNTER — Encounter: Payer: Self-pay | Admitting: Vascular Surgery

## 2011-06-07 ENCOUNTER — Other Ambulatory Visit: Payer: Self-pay | Admitting: *Deleted

## 2011-06-07 DIAGNOSIS — I70219 Atherosclerosis of native arteries of extremities with intermittent claudication, unspecified extremity: Secondary | ICD-10-CM

## 2011-06-08 ENCOUNTER — Ambulatory Visit: Payer: Medicaid Other | Admitting: Vascular Surgery

## 2011-11-14 ENCOUNTER — Emergency Department (HOSPITAL_COMMUNITY)
Admission: EM | Admit: 2011-11-14 | Discharge: 2011-11-14 | Disposition: A | Discharge status: Home / Self Care | Payer: Medicaid Other | Attending: Emergency Medicine | Admitting: Emergency Medicine

## 2011-11-14 ENCOUNTER — Encounter (HOSPITAL_COMMUNITY): Payer: Self-pay | Admitting: Emergency Medicine

## 2011-11-14 DIAGNOSIS — F411 Generalized anxiety disorder: Secondary | ICD-10-CM | POA: Insufficient documentation

## 2011-11-14 DIAGNOSIS — Z87891 Personal history of nicotine dependence: Secondary | ICD-10-CM | POA: Insufficient documentation

## 2011-11-14 DIAGNOSIS — F3289 Other specified depressive episodes: Secondary | ICD-10-CM | POA: Insufficient documentation

## 2011-11-14 DIAGNOSIS — N289 Disorder of kidney and ureter, unspecified: Secondary | ICD-10-CM | POA: Insufficient documentation

## 2011-11-14 DIAGNOSIS — Z794 Long term (current) use of insulin: Secondary | ICD-10-CM | POA: Insufficient documentation

## 2011-11-14 DIAGNOSIS — F329 Major depressive disorder, single episode, unspecified: Secondary | ICD-10-CM | POA: Insufficient documentation

## 2011-11-14 DIAGNOSIS — I739 Peripheral vascular disease, unspecified: Secondary | ICD-10-CM | POA: Insufficient documentation

## 2011-11-14 DIAGNOSIS — Z86718 Personal history of other venous thrombosis and embolism: Secondary | ICD-10-CM | POA: Insufficient documentation

## 2011-11-14 DIAGNOSIS — Z862 Personal history of diseases of the blood and blood-forming organs and certain disorders involving the immune mechanism: Secondary | ICD-10-CM | POA: Insufficient documentation

## 2011-11-14 DIAGNOSIS — E785 Hyperlipidemia, unspecified: Secondary | ICD-10-CM | POA: Insufficient documentation

## 2011-11-14 DIAGNOSIS — Z7901 Long term (current) use of anticoagulants: Secondary | ICD-10-CM | POA: Insufficient documentation

## 2011-11-14 DIAGNOSIS — E1159 Type 2 diabetes mellitus with other circulatory complications: Secondary | ICD-10-CM | POA: Insufficient documentation

## 2011-11-14 DIAGNOSIS — B029 Zoster without complications: Secondary | ICD-10-CM | POA: Insufficient documentation

## 2011-11-14 LAB — URINALYSIS, ROUTINE W REFLEX MICROSCOPIC
Bilirubin Urine: NEGATIVE
Glucose, UA: >1000 mg/dL — AB
Ketones, ur: NEGATIVE mg/dL
Leukocytes, UA: NEGATIVE
Nitrite: NEGATIVE
Protein, ur: >300 mg/dL — AB
Specific Gravity, Urine: 1.02 (ref 1.005–1.030)
Urobilinogen, UA: 0.2 mg/dL (ref 0.0–1.0)
pH: 6 (ref 5.0–8.0)

## 2011-11-14 LAB — URINE MICROSCOPIC-ADD ON

## 2011-11-14 MED ORDER — HYDROMORPHONE HCL PF 2 MG/ML IJ SOLN
2.0000 mg | Freq: Once | INTRAMUSCULAR | Status: AC
  Administered 2011-11-14: 2 mg via INTRAMUSCULAR
  Filled 2011-11-14: qty 1

## 2011-11-14 MED ORDER — ONDANSETRON 8 MG PO TBDP
8.0000 mg | ORAL_TABLET | Freq: Once | ORAL | Status: AC
  Administered 2011-11-14: 8 mg via ORAL
  Filled 2011-11-14: qty 1

## 2011-11-14 MED ORDER — VALACYCLOVIR HCL 500 MG PO TABS
1000.0000 mg | ORAL_TABLET | Freq: Once | ORAL | Status: AC
  Administered 2011-11-14: 1000 mg via ORAL
  Filled 2011-11-14: qty 2

## 2011-11-14 MED ORDER — VALACYCLOVIR HCL 1 G PO TABS: 1000.0000 mg | ORAL_TABLET | Freq: Three times a day (TID) | ORAL | Status: AC

## 2011-11-14 NOTE — ED Notes (Signed)
Pt c/o right flank pain/n and rash to right back.

## 2011-11-14 NOTE — ED Provider Notes (Signed)
History     CSN: [id number]  Arrival date & time 11/14/11  1742   First MD Initiated Contact with Patient 11/14/11 2014      Chief Complaint  Patient presents with  . Flank Pain    (Consider location/radiation/quality/duration/timing/severity/associated sxs/prior treatment) HPI Comments: [name] is a 41 y.o. Female who presents with right flank pain and rash that started yesterday. She has chronic pain and is taking Percocet without relief of the discomfort. The discomfort and rash feels similar to when she had shingles in her upper back. She denies fever, chills, nausea, vomiting, change in bowel or urinary habits. No dysuria, urinary frequency, or hematuria. There are no modifying factors  Patient is a 41 y.o. female presenting with flank pain. The history is provided by the patient.  Flank Pain    Past Medical History  Diagnosis Date  . Shingles     11/2010  . Cellulitis     of face - 12/2010  . Peripheral vascular disease in diabetes mellitus   . Hyperlipidemia   . Tobacco abuse     0.5 - 1ppd x 21 yrs  . Headache   . Renal disorder     glomerulonephritis  . Blood transfusion   . Anxiety   . Depression   . Peripheral vascular disease   . Diabetes mellitus     IDDM Since Age 64  . Embolism - blood clot   . Renal insufficiency     Past Surgical History  Procedure Date  . Wrist surgery     left  . Embolectomy 01/29/2011    Procedure: EMBOLECTOMY;  Surgeon: Mal Misty, MD;  Location: [facility] OR;  Service: Vascular;  Laterality: Left;  Left Popliteal and Tibial Embolectomy with patch angioplasty  . Dilation and curettage of uterus   . Multiple tooth extractions   . Amputation 03/03/2011    Procedure: AMPUTATION DIGIT;  Surgeon: Newt Minion, MD;  Location: [place];  Service: Orthopedics;  Laterality: Left;  Left Foot Amputation 4th and 5th toes at MTP joint  . Tee without cardioversion 04/15/2011    Procedure: TRANSESOPHAGEAL ECHOCARDIOGRAM (TEE);  Surgeon:  [name], MD;  [facility];  Service: Cardiovascular;  Laterality: N/A;  . Amputation 04/22/2011    Procedure: AMPUTATION BELOW KNEE;  Surgeon: Newt Minion, MD;  Location: [place];  Service: Orthopedics;  Laterality: Left;  Left Below Knee Amputation    Family History  Problem Relation Age of Onset  . COPD Mother     alive - 50  . Hypertension Mother   . Diabetes Mother   . Stroke Mother   . Coronary artery disease Mother   . Diabetes Father     alive - 52  . Hypertension Father   . Coronary artery disease Father   . Anesthesia problems Neg Hx     History  Substance Use Topics  . Smoking status: Passive Smoke Exposure - Never Smoker -- 1.0 packs/day for 21 years    Types: Cigarettes    Last Attempt to Quit: 01/30/2011  . Smokeless tobacco: Never Used  . Alcohol Use: No    OB History    Grav Para Term Preterm Abortions TAB SAB Ect Mult Living   1 1  1      1       Review of Systems  Genitourinary: Positive for flank pain.  All other systems reviewed and are negative.    Allergies  Aspirin; Ibuprofen; and Losartan  Home Medications   Current Outpatient Rx  Name Route Sig Dispense Refill  . ALPRAZOLAM 1 MG PO TABS Oral Take 1 tablet (1 mg total) by mouth daily as needed. For anxiety 10 tablet 0  . INSULIN ASPART 100 UNIT/ML [place] SOLN Subcutaneous Inject 15 Units into the skin 3 (three) times daily before meals.    . INSULIN GLARGINE 100 UNIT/ML [place] SOLN Subcutaneous Inject 25 Units into the skin at bedtime.    . OXYCODONE-ACETAMINOPHEN 10-325 MG PO TABS Oral Take 1 tablet by mouth 4 (four) times daily.    Marland Kitchen PAROXETINE HCL 20 MG PO TABS Oral Take 20 mg by mouth daily.    Marland Kitchen POTASSIUM CHLORIDE CRYS ER 10 MEQ PO TBCR Oral Take 10 mEq by mouth daily.    Marland Kitchen PRAVASTATIN SODIUM 40 MG PO TABS Oral Take 1 tablet (40 mg total) by mouth daily. 30 tablet 5  . WARFARIN SODIUM 5 MG PO TABS Oral Take 10-15 mg by mouth daily. Patient alternates with 10mg  one day then  15mg  the next day(11/14/11 had 10mg )    . FUROSEMIDE 20 MG PO TABS Oral Take 20 mg by mouth 3 (three) times a week. On Mondays, Wednesdays, and Fridays    . POTASSIUM CHLORIDE CRYS ER 20 MEQ PO TBCR Oral Take 0.5 tablets (10 mEq total) by mouth daily. 7 tablet 0  . VALACYCLOVIR HCL 1 G PO TABS Oral Take 1 tablet (1,000 mg total) by mouth 3 (three) times daily. 21 tablet 0    BP 126/79  Pulse 98  Temp 98.1 F (36.7 C) (Oral)  Resp 22  Ht 5\' 1"  (1.549 m)  Wt 125 lb (56.7 kg)  BMI 23.62 kg/m2  SpO2 95%  Physical Exam  Nursing note and vitals reviewed. Constitutional: She is oriented to person, place, and time. She appears well-developed and well-nourished.  HENT:  Head: Normocephalic and atraumatic.  Eyes: Conjunctivae normal and EOM are normal. Pupils are equal, round, and reactive to light.  Neck: Normal range of motion and phonation normal. Neck supple.  Cardiovascular: Normal rate, regular rhythm and intact distal pulses.   Pulmonary/Chest: Effort normal and breath sounds normal. She exhibits no tenderness.  Abdominal: Soft. She exhibits no distension. There is no tenderness. There is no guarding.  Musculoskeletal: Normal range of motion.  Neurological: She is alert and oriented to person, place, and time. She has normal strength. She exhibits normal muscle tone.  Skin: Skin is warm and dry.       Indistinct punctate, red lesions, right lower flank in the L3-4 distribution. No anterior component. No vesicles, or petechiae.  Psychiatric: She has a normal mood and affect. Her behavior is normal. Judgment and thought content normal.    ED Course  Procedures (including critical care time)  Emergency department treatment: Dilaudid IM, Zofran, oral;  Valtrex. Oral  Labs Reviewed  URINALYSIS, ROUTINE W REFLEX MICROSCOPIC - Abnormal; Notable for the following:    Glucose, UA >1000 (*)     Hgb urine dipstick SMALL (*)     Protein, ur >300 (*)     All other components within normal  limits  URINE MICROSCOPIC-ADD ON - Abnormal; Notable for the following:    Bacteria, UA FEW (*)     All other components within normal limits  URINE CULTURE   Nursing notes, applicable records and vitals reviewed.     1. Shingles       MDM  Rash without clear etiology but possibly related to shingles,  recurrent.Doubt metabolic instability, serious bacterial infection or impending vascular collapse; the patient is stable for discharge.   Plan: Home Medications- Valtrex; Home Treatments- rest; Recommended follow up- PCP prn       Richarda Blade, MD 11/14/11 2148

## 2011-11-16 LAB — URINE CULTURE: Colony Count: 10000

## 2012-01-24 ENCOUNTER — Encounter (HOSPITAL_COMMUNITY): Payer: Self-pay | Admitting: *Deleted

## 2012-01-24 ENCOUNTER — Emergency Department (HOSPITAL_COMMUNITY)
Admission: EM | Admit: 2012-01-24 | Discharge: 2012-01-24 | Disposition: A | Discharge status: Home / Self Care | Payer: Medicaid Other | Attending: Emergency Medicine | Admitting: Emergency Medicine

## 2012-01-24 DIAGNOSIS — Z87448 Personal history of other diseases of urinary system: Secondary | ICD-10-CM | POA: Insufficient documentation

## 2012-01-24 DIAGNOSIS — Z872 Personal history of diseases of the skin and subcutaneous tissue: Secondary | ICD-10-CM | POA: Insufficient documentation

## 2012-01-24 DIAGNOSIS — R109 Unspecified abdominal pain: Secondary | ICD-10-CM | POA: Insufficient documentation

## 2012-01-24 DIAGNOSIS — F411 Generalized anxiety disorder: Secondary | ICD-10-CM | POA: Insufficient documentation

## 2012-01-24 DIAGNOSIS — Z79899 Other long term (current) drug therapy: Secondary | ICD-10-CM | POA: Insufficient documentation

## 2012-01-24 DIAGNOSIS — Z794 Long term (current) use of insulin: Secondary | ICD-10-CM | POA: Insufficient documentation

## 2012-01-24 DIAGNOSIS — E785 Hyperlipidemia, unspecified: Secondary | ICD-10-CM | POA: Insufficient documentation

## 2012-01-24 DIAGNOSIS — M549 Dorsalgia, unspecified: Secondary | ICD-10-CM | POA: Insufficient documentation

## 2012-01-24 DIAGNOSIS — I798 Other disorders of arteries, arterioles and capillaries in diseases classified elsewhere: Secondary | ICD-10-CM | POA: Insufficient documentation

## 2012-01-24 DIAGNOSIS — R110 Nausea: Secondary | ICD-10-CM | POA: Insufficient documentation

## 2012-01-24 DIAGNOSIS — Z862 Personal history of diseases of the blood and blood-forming organs and certain disorders involving the immune mechanism: Secondary | ICD-10-CM | POA: Insufficient documentation

## 2012-01-24 DIAGNOSIS — F17200 Nicotine dependence, unspecified, uncomplicated: Secondary | ICD-10-CM | POA: Insufficient documentation

## 2012-01-24 DIAGNOSIS — Z8619 Personal history of other infectious and parasitic diseases: Secondary | ICD-10-CM | POA: Insufficient documentation

## 2012-01-24 DIAGNOSIS — F3289 Other specified depressive episodes: Secondary | ICD-10-CM | POA: Insufficient documentation

## 2012-01-24 DIAGNOSIS — E1059 Type 1 diabetes mellitus with other circulatory complications: Secondary | ICD-10-CM | POA: Insufficient documentation

## 2012-01-24 DIAGNOSIS — Z7901 Long term (current) use of anticoagulants: Secondary | ICD-10-CM | POA: Insufficient documentation

## 2012-01-24 DIAGNOSIS — F329 Major depressive disorder, single episode, unspecified: Secondary | ICD-10-CM | POA: Insufficient documentation

## 2012-01-24 LAB — URINE MICROSCOPIC-ADD ON

## 2012-01-24 LAB — URINALYSIS, ROUTINE W REFLEX MICROSCOPIC
Bilirubin Urine: NEGATIVE
Glucose, UA: >1000 mg/dL — AB
Ketones, ur: NEGATIVE mg/dL
Leukocytes, UA: NEGATIVE
Nitrite: NEGATIVE
Protein, ur: >300 mg/dL — AB
Specific Gravity, Urine: >1.03 — ABNORMAL HIGH (ref 1.005–1.030)
Urobilinogen, UA: 0.2 mg/dL (ref 0.0–1.0)
pH: 6 (ref 5.0–8.0)

## 2012-01-24 MED ORDER — OXYCODONE-ACETAMINOPHEN 5-325 MG PO TABS: 1.0000 | ORAL_TABLET | ORAL | Status: DC | PRN

## 2012-01-24 MED ORDER — HYDROMORPHONE HCL PF 1 MG/ML IJ SOLN
1.0000 mg | Freq: Once | INTRAMUSCULAR | Status: AC
  Administered 2012-01-24: 1 mg via INTRAMUSCULAR
  Filled 2012-01-24: qty 1

## 2012-01-24 NOTE — ED Notes (Signed)
Pt tearful at present --meds given.

## 2012-01-24 NOTE — ED Notes (Addendum)
Pt c/o bilateral flank pain that radiates around to abd area, nausea. Pain started yesterday, admits to burning with urination.

## 2012-01-24 NOTE — ED Notes (Signed)
Pt reports ab pain that started yesterday. Reports painful urination.   +nausea, vomiting. No fever or diarrhea

## 2012-01-25 LAB — URINE CULTURE
Colony Count: NO GROWTH
Culture: NO GROWTH

## 2012-01-30 NOTE — ED Provider Notes (Signed)
History    42 year old female with abdominal, bilateral flank and back pain. Gradual onset yesterday. Constant. Pain starts in epigastrium, radiates along the costal margins into her back. No appreciable exacerbating relieving factors. Associated with some nausea. No vomiting. Dysuria. The urinary complaints. No diarrhea. No fever or chills.  CSN: [id number]  Arrival date & time 01/24/12  1716   First MD Initiated Contact with Patient 01/24/12 1735      Chief Complaint  Patient presents with  . Back Pain  . Abdominal Pain  . Nausea    (Consider location/radiation/quality/duration/timing/severity/associated sxs/prior treatment) HPI  Past Medical History  Diagnosis Date  . Shingles     11/2010  . Cellulitis     of face - 12/2010  . Peripheral vascular disease in diabetes mellitus   . Hyperlipidemia   . Tobacco abuse     0.5 - 1ppd x 21 yrs  . Headache   . Renal disorder     glomerulonephritis  . Blood transfusion   . Anxiety   . Depression   . Peripheral vascular disease   . Diabetes mellitus     IDDM Since Age 16  . Embolism - blood clot   . Renal insufficiency     Past Surgical History  Procedure Date  . Wrist surgery     left  . Embolectomy 01/29/2011    Procedure: EMBOLECTOMY;  Surgeon: Mal Misty, MD;  Location: [facility] OR;  Service: Vascular;  Laterality: Left;  Left Popliteal and Tibial Embolectomy with patch angioplasty  . Dilation and curettage of uterus   . Multiple tooth extractions   . Amputation 03/03/2011    Procedure: AMPUTATION DIGIT;  Surgeon: Newt Minion, MD;  Location: [place];  Service: Orthopedics;  Laterality: Left;  Left Foot Amputation 4th and 5th toes at MTP joint  . Tee without cardioversion 04/15/2011    Procedure: TRANSESOPHAGEAL ECHOCARDIOGRAM (TEE);  Surgeon: [name], MD;  [facility];  Service: Cardiovascular;  Laterality: N/A;  . Amputation 04/22/2011    Procedure: AMPUTATION BELOW KNEE;  Surgeon: Newt Minion, MD;   Location: [place];  Service: Orthopedics;  Laterality: Left;  Left Below Knee Amputation    Family History  Problem Relation Age of Onset  . COPD Mother     alive - 24  . Hypertension Mother   . Diabetes Mother   . Stroke Mother   . Coronary artery disease Mother   . Diabetes Father     alive - 16  . Hypertension Father   . Coronary artery disease Father   . Anesthesia problems Neg Hx     History  Substance Use Topics  . Smoking status: Passive Smoke Exposure - Never Smoker -- 1.0 packs/day for 21 years    Types: Cigarettes    Last Attempt to Quit: 01/30/2011  . Smokeless tobacco: Never Used  . Alcohol Use: No    OB History    Grav Para Term Preterm Abortions TAB SAB Ect Mult Living   1 1  1      1       Review of Systems  All systems reviewed and negative, other than as noted in HPI.   Allergies  Aspirin; Ibuprofen; and Losartan  Home Medications   Current Outpatient Rx  Name  Route  Sig  Dispense  Refill  . ALPRAZOLAM 1 MG PO TABS   Oral   Take 1 tablet (1 mg total) by mouth daily as needed. For anxiety  10 tablet   0   . FUROSEMIDE 20 MG PO TABS   Oral   Take 20 mg by mouth 3 (three) times a week. On Mondays, Wednesdays, and Fridays         . INSULIN ASPART 100 UNIT/ML [place] SOLN   Subcutaneous   Inject 15 Units into the skin 3 (three) times daily before meals.         . INSULIN GLARGINE 100 UNIT/ML [place] SOLN   Subcutaneous   Inject 25 Units into the skin at bedtime.         . OXYCODONE-ACETAMINOPHEN 10-325 MG PO TABS   Oral   Take 1 tablet by mouth 4 (four) times daily.         Marland Kitchen PAROXETINE HCL 20 MG PO TABS   Oral   Take 20 mg by mouth daily.         Marland Kitchen POTASSIUM CHLORIDE CRYS ER 10 MEQ PO TBCR   Oral   Take 10 mEq by mouth daily.         . WARFARIN SODIUM 5 MG PO TABS   Oral   Take 10-15 mg by mouth every evening. Patient alternates with 10mg  one day then 15mg  the next day(11/14/11 had 10mg )         . OXYCODONE-ACETAMINOPHEN  5-325 MG PO TABS   Oral   Take 1 tablet by mouth every 4 (four) hours as needed for pain.   10 tablet   0     BP 140/73  Pulse 110  Temp 97.6 F (36.4 C) (Oral)  Resp 20  Ht 5\' 1"  (1.549 m)  Wt 130 lb (58.968 kg)  BMI 24.56 kg/m2  SpO2 100%  LMP 12/28/2010  Physical Exam  Nursing note and vitals reviewed. Constitutional: She appears well-developed and well-nourished. No distress.  HENT:  Head: Normocephalic and atraumatic.  Eyes: Conjunctivae normal are normal. Right eye exhibits no discharge. Left eye exhibits no discharge.  Neck: Neck supple.  Cardiovascular: Normal rate, regular rhythm and normal heart sounds.  Exam reveals no gallop and no friction rub.   No murmur heard. Pulmonary/Chest: Effort normal and breath sounds normal. No respiratory distress. She exhibits no tenderness.  Abdominal: Soft. She exhibits no distension. There is tenderness. There is no rebound.  Genitourinary:       No costovertebral angle tenderness  Musculoskeletal: She exhibits no edema and no tenderness.  Neurological: She is alert.  Skin: Skin is warm and dry.  Psychiatric: She has a normal mood and affect. Her behavior is normal. Thought content normal.    ED Course  Procedures (including critical care time)  Labs Reviewed  URINALYSIS, ROUTINE W REFLEX MICROSCOPIC - Abnormal; Notable for the following:    Specific Gravity, Urine >1.030 (*)     Glucose, UA >1000 (*)     Hgb urine dipstick SMALL (*)     Protein, ur >300 (*)     All other components within normal limits  URINE MICROSCOPIC-ADD ON - Abnormal; Notable for the following:    Squamous Epithelial / LPF FEW (*)     Bacteria, UA FEW (*)     All other components within normal limits  URINE CULTURE  LAB REPORT - SCANNED   No results found.   1. Back pain       MDM  42 year old female with abdominal pain, bilateral flank pain. Benign abdominal exam. No CVA tenderness. Dysuria, the urinalysis is consistent with  infection. We'll defer treatment at this  time. Will send culture. Patient feels better with medication. Without abdominal tenderness, I do not think that further workup is indicated at this time. Emergent return precautions were discussed.       Virgel Manifold, MD 01/30/12 [phone]

## 2012-06-07 ENCOUNTER — Emergency Department (HOSPITAL_COMMUNITY)
Admission: EM | Admit: 2012-06-07 | Discharge: 2012-06-07 | Disposition: A | Discharge status: Home / Self Care | Payer: Medicaid Other | Attending: Emergency Medicine | Admitting: Emergency Medicine

## 2012-06-07 ENCOUNTER — Encounter (HOSPITAL_COMMUNITY): Payer: Self-pay | Admitting: *Deleted

## 2012-06-07 DIAGNOSIS — Z794 Long term (current) use of insulin: Secondary | ICD-10-CM | POA: Insufficient documentation

## 2012-06-07 DIAGNOSIS — Z8619 Personal history of other infectious and parasitic diseases: Secondary | ICD-10-CM | POA: Insufficient documentation

## 2012-06-07 DIAGNOSIS — E1159 Type 2 diabetes mellitus with other circulatory complications: Secondary | ICD-10-CM | POA: Insufficient documentation

## 2012-06-07 DIAGNOSIS — F3289 Other specified depressive episodes: Secondary | ICD-10-CM | POA: Insufficient documentation

## 2012-06-07 DIAGNOSIS — Z86711 Personal history of pulmonary embolism: Secondary | ICD-10-CM | POA: Insufficient documentation

## 2012-06-07 DIAGNOSIS — I798 Other disorders of arteries, arterioles and capillaries in diseases classified elsewhere: Secondary | ICD-10-CM | POA: Insufficient documentation

## 2012-06-07 DIAGNOSIS — Z862 Personal history of diseases of the blood and blood-forming organs and certain disorders involving the immune mechanism: Secondary | ICD-10-CM | POA: Insufficient documentation

## 2012-06-07 DIAGNOSIS — Z79899 Other long term (current) drug therapy: Secondary | ICD-10-CM | POA: Insufficient documentation

## 2012-06-07 DIAGNOSIS — Z8639 Personal history of other endocrine, nutritional and metabolic disease: Secondary | ICD-10-CM | POA: Insufficient documentation

## 2012-06-07 DIAGNOSIS — R300 Dysuria: Secondary | ICD-10-CM | POA: Insufficient documentation

## 2012-06-07 DIAGNOSIS — R3915 Urgency of urination: Secondary | ICD-10-CM | POA: Insufficient documentation

## 2012-06-07 DIAGNOSIS — F329 Major depressive disorder, single episode, unspecified: Secondary | ICD-10-CM | POA: Insufficient documentation

## 2012-06-07 DIAGNOSIS — Z87448 Personal history of other diseases of urinary system: Secondary | ICD-10-CM | POA: Insufficient documentation

## 2012-06-07 DIAGNOSIS — Z872 Personal history of diseases of the skin and subcutaneous tissue: Secondary | ICD-10-CM | POA: Insufficient documentation

## 2012-06-07 DIAGNOSIS — F411 Generalized anxiety disorder: Secondary | ICD-10-CM | POA: Insufficient documentation

## 2012-06-07 DIAGNOSIS — R350 Frequency of micturition: Secondary | ICD-10-CM | POA: Insufficient documentation

## 2012-06-07 DIAGNOSIS — N390 Urinary tract infection, site not specified: Secondary | ICD-10-CM | POA: Insufficient documentation

## 2012-06-07 DIAGNOSIS — R1110 Vomiting, unspecified: Secondary | ICD-10-CM | POA: Insufficient documentation

## 2012-06-07 DIAGNOSIS — Z7901 Long term (current) use of anticoagulants: Secondary | ICD-10-CM | POA: Insufficient documentation

## 2012-06-07 LAB — CBC WITH DIFFERENTIAL/PLATELET
Basophils Absolute: 0 K/uL (ref 0.0–0.1)
Basophils Relative: 0 % (ref 0–1)
Eosinophils Absolute: 0.3 K/uL (ref 0.0–0.7)
Eosinophils Relative: 2 % (ref 0–5)
HCT: 45.8 % (ref 36.0–46.0)
Hemoglobin: 15.5 g/dL — ABNORMAL HIGH (ref 12.0–15.0)
Lymphocytes Relative: 37 % (ref 12–46)
Lymphs Abs: 5.1 K/uL — ABNORMAL HIGH (ref 0.7–4.0)
MCH: 29.7 pg (ref 26.0–34.0)
MCHC: 33.8 g/dL (ref 30.0–36.0)
MCV: 87.7 fL (ref 78.0–100.0)
Monocytes Absolute: 1 K/uL (ref 0.1–1.0)
Monocytes Relative: 8 % (ref 3–12)
Neutro Abs: 7.2 K/uL (ref 1.7–7.7)
Neutrophils Relative %: 53 % (ref 43–77)
Platelets: 483 K/uL — ABNORMAL HIGH (ref 150–400)
RBC: 5.22 MIL/uL — ABNORMAL HIGH (ref 3.87–5.11)
RDW: 15.3 % (ref 11.5–15.5)
WBC: 13.7 K/uL — ABNORMAL HIGH (ref 4.0–10.5)

## 2012-06-07 LAB — URINALYSIS, ROUTINE W REFLEX MICROSCOPIC
Bilirubin Urine: NEGATIVE
Glucose, UA: 100 mg/dL — AB
Ketones, ur: NEGATIVE mg/dL
Nitrite: POSITIVE — AB
Protein, ur: >300 mg/dL — AB
Specific Gravity, Urine: 1.02 (ref 1.005–1.030)
Urobilinogen, UA: 4 mg/dL — ABNORMAL HIGH (ref 0.0–1.0)
pH: 6 (ref 5.0–8.0)

## 2012-06-07 LAB — COMPREHENSIVE METABOLIC PANEL
ALT: 16 U/L (ref 0–35)
AST: 18 U/L (ref 0–37)
Albumin: 2.5 g/dL — ABNORMAL LOW (ref 3.5–5.2)
Alkaline Phosphatase: 139 U/L — ABNORMAL HIGH (ref 39–117)
BUN: 19 mg/dL (ref 6–23)
CO2: 25 mEq/L (ref 19–32)
Calcium: 8.7 mg/dL (ref 8.4–10.5)
Chloride: 99 mEq/L (ref 96–112)
Creatinine, Ser: 1.17 mg/dL — ABNORMAL HIGH (ref 0.50–1.10)
GFR calc Af Amer: 66 mL/min — ABNORMAL LOW (ref >90)
GFR calc non Af Amer: 57 mL/min — ABNORMAL LOW (ref >90)
Glucose, Bld: 136 mg/dL — ABNORMAL HIGH (ref 70–99)
Potassium: 3.4 mEq/L — ABNORMAL LOW (ref 3.5–5.1)
Sodium: 135 mEq/L (ref 135–145)
Total Bilirubin: 0.1 mg/dL — ABNORMAL LOW (ref 0.3–1.2)
Total Protein: 6.9 g/dL (ref 6.0–8.3)

## 2012-06-07 LAB — PROTIME-INR
INR: 1.47 (ref 0.00–1.49)
Prothrombin Time: 17.4 seconds — ABNORMAL HIGH (ref 11.6–15.2)

## 2012-06-07 LAB — URINE MICROSCOPIC-ADD ON

## 2012-06-07 MED ORDER — PHENAZOPYRIDINE HCL 100 MG PO TABS
200.0000 mg | ORAL_TABLET | Freq: Once | ORAL | Status: AC
  Administered 2012-06-07: 200 mg via ORAL
  Filled 2012-06-07: qty 2

## 2012-06-07 MED ORDER — CIPROFLOXACIN HCL 500 MG PO TABS: 500.0000 mg | ORAL_TABLET | Freq: Two times a day (BID) | ORAL | Status: DC

## 2012-06-07 MED ORDER — CIPROFLOXACIN HCL 250 MG PO TABS
500.0000 mg | ORAL_TABLET | Freq: Once | ORAL | Status: AC
  Administered 2012-06-07: 500 mg via ORAL
  Filled 2012-06-07: qty 2

## 2012-06-07 MED ORDER — PHENAZOPYRIDINE HCL 200 MG PO TABS: 200.0000 mg | ORAL_TABLET | Freq: Three times a day (TID) | ORAL | Status: DC

## 2012-06-07 MED ORDER — ONDANSETRON HCL 4 MG/2ML IJ SOLN
4.0000 mg | Freq: Once | INTRAMUSCULAR | Status: AC
  Administered 2012-06-07: 4 mg via INTRAVENOUS
  Filled 2012-06-07: qty 2

## 2012-06-07 MED ORDER — HYDROMORPHONE HCL PF 1 MG/ML IJ SOLN
1.0000 mg | Freq: Once | INTRAMUSCULAR | Status: AC
  Administered 2012-06-07: 1 mg via INTRAVENOUS
  Filled 2012-06-07: qty 1

## 2012-06-07 NOTE — ED Provider Notes (Addendum)
History     CSN: [id number]  Arrival date & time 06/07/12  0300   First MD Initiated Contact with Patient 06/07/12 0501      Chief Complaint  Patient presents with  . Emesis  . Abdominal Pain    (Consider location/radiation/quality/duration/timing/severity/associated sxs/prior treatment) HPI HPI Comments: [name] is a 42 y.o. female who presents to the Emergency Department complaining of pelvic pain, frequency and urgency, flank pain and vomiting that began an hour ago. She has been on AZO because she began to have discomfort with urination day before yesterday. She had frequency and burning with urination yesterday which became worse overnight. Denies fever, chills.  PCP Dr. Luan Pulling  Past Medical History  Diagnosis Date  . Shingles     11/2010  . Cellulitis     of face - 12/2010  . Peripheral vascular disease in diabetes mellitus   . Hyperlipidemia   . Tobacco abuse     0.5 - 1ppd x 21 yrs  . Headache   . Renal disorder     glomerulonephritis  . Blood transfusion   . Anxiety   . Depression   . Peripheral vascular disease   . Diabetes mellitus     IDDM Since Age 63  . Embolism - blood clot   . Renal insufficiency     Past Surgical History  Procedure Laterality Date  . Wrist surgery      left  . Embolectomy  01/29/2011    Procedure: EMBOLECTOMY;  Surgeon: Mal Misty, MD;  Location: [facility] OR;  Service: Vascular;  Laterality: Left;  Left Popliteal and Tibial Embolectomy with patch angioplasty  . Dilation and curettage of uterus    . Multiple tooth extractions    . 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 Tee without cardioversion  04/15/2011    Procedure: TRANSESOPHAGEAL ECHOCARDIOGRAM (TEE);  Surgeon: [name], MD;  Location: [facility];  Service: Cardiovascular;  Laterality: N/A;  .  Amputation  04/22/2011    Procedure: AMPUTATION BELOW KNEE;  Surgeon: Newt Minion, MD;  Location: [place];  Service: Orthopedics;  Laterality: Left;  Left Below Knee Amputation    Family History  Problem Relation Age of Onset  . COPD Mother     alive - 81  . Hypertension Mother   . Diabetes Mother   . Stroke Mother   . Coronary artery disease Mother   . Diabetes Father     alive - 62  . Hypertension Father   . Coronary artery disease Father   . Anesthesia problems Neg Hx     History  Substance Use Topics  . Smoking status: Passive Smoke Exposure - Never Smoker -- 1.00 packs/day for 21 years    Types: Cigarettes    Last Attempt to Quit: 01/30/2011  . Smokeless tobacco: Never Used  . Alcohol Use: No    OB History   Grav Para Term Preterm Abortions TAB SAB Ect Mult Living   1 1  1      1       Review of Systems  Constitutional: Negative for fever.       10 Systems reviewed and are negative for acute change except as noted in the HPI.  HENT: Negative for congestion.   Eyes: Negative for discharge and redness.  Respiratory: Negative for cough and shortness of breath.   Cardiovascular: Negative for chest pain.  Gastrointestinal: Negative for vomiting and abdominal pain.  Genitourinary: Positive for dysuria, urgency and frequency.  Musculoskeletal: Negative for back pain.  Skin: Negative for rash.  Neurological: Negative for syncope, numbness and headaches.  Psychiatric/Behavioral:       No behavior change.    Allergies  Aspirin; Ibuprofen; and Losartan  Home Medications   Current Outpatient Rx  Name  Route  Sig  Dispense  Refill  . ALPRAZolam (XANAX) 1 MG tablet   Oral   Take 1 tablet (1 mg total) by mouth daily as needed. For anxiety   10 tablet   0   . furosemide (LASIX) 20 MG tablet   Oral   Take 20 mg by mouth 3 (three) times a week. On Mondays, Wednesdays, and Fridays         . insulin aspart (NOVOLOG FLEXPEN) 100 UNIT/ML injection   Subcutaneous    Inject 15 Units into the skin 3 (three) times daily before meals.         . insulin glargine (LANTUS) 100 UNIT/ML injection   Subcutaneous   Inject 25 Units into the skin at bedtime.         Marland Kitchen oxyCODONE-acetaminophen (PERCOCET) 10-325 MG per tablet   Oral   Take 1 tablet by mouth 4 (four) times daily.         Marland Kitchen oxyCODONE-acetaminophen (PERCOCET/ROXICET) 5-325 MG per tablet   Oral   Take 1 tablet by mouth every 4 (four) hours as needed for pain.   10 tablet   0   . PARoxetine (PAXIL) 20 MG tablet   Oral   Take 20 mg by mouth daily.         . potassium chloride (K-DUR,KLOR-CON) 10 MEQ tablet   Oral   Take 10 mEq by mouth daily.         Marland Kitchen warfarin (COUMADIN) 5 MG tablet   Oral   Take 10 mg by mouth every evening. Patient alternates with 10mg  one day then 15mg  the next day(11/14/11 had 10mg )           BP 130/69  Pulse 99  Temp(Src) 99 F (37.2 C) (Oral)  Resp 20  Ht 5\' 1"  (1.549 m)  Wt 130 lb (58.968 kg)  BMI 24.58 kg/m2  SpO2 96%  LMP 12/28/2010  Physical Exam  Nursing note and vitals reviewed. Constitutional: She appears well-developed and well-nourished.  Awake, alert, nontoxic appearance.  HENT:  Head: Normocephalic and atraumatic.  Right Ear: External ear normal.  Left Ear: External ear normal.  Eyes: EOM are normal. Pupils are equal, round, and reactive to light.  Neck: Normal range of motion. Neck supple.  Cardiovascular: Normal rate and intact distal pulses.   Pulmonary/Chest: Effort normal and breath sounds normal. She exhibits no tenderness.  Abdominal: Soft. Bowel sounds are normal. There is tenderness. There is no rebound.  Suprapubic pain with palpation  Genitourinary:  No cva tenderness  Musculoskeletal: She exhibits no tenderness.  Baseline ROM, no obvious new focal weakness.  Neurological:  Mental status and motor strength appears baseline for patient and situation.  Skin: No rash noted.  Psychiatric: She has a normal mood and  affect.    ED Course  Procedures (including critical care time) Results for orders placed during the hospital encounter of 06/07/12  CBC WITH DIFFERENTIAL      Result Value Range   WBC 13.7 (*) 4.0 - 10.5  K/uL   RBC 5.22 (*) 3.87 - 5.11 MIL/uL   Hemoglobin 15.5 (*) 12.0 - 15.0 g/dL   HCT 45.8  36.0 - 46.0 %   MCV 87.7  78.0 - 100.0 fL   MCH 29.7  26.0 - 34.0 pg   MCHC 33.8  30.0 - 36.0 g/dL   RDW 15.3  11.5 - 15.5 %   Platelets 483 (*) 150 - 400 K/uL   Neutrophils Relative % 53  43 - 77 %   Neutro Abs 7.2  1.7 - 7.7 K/uL   Lymphocytes Relative 37  12 - 46 %   Lymphs Abs 5.1 (*) 0.7 - 4.0 K/uL   Monocytes Relative 8  3 - 12 %   Monocytes Absolute 1.0  0.1 - 1.0 K/uL   Eosinophils Relative 2  0 - 5 %   Eosinophils Absolute 0.3  0.0 - 0.7 K/uL   Basophils Relative 0  0 - 1 %   Basophils Absolute 0.0  0.0 - 0.1 K/uL  COMPREHENSIVE METABOLIC PANEL      Result Value Range   Sodium 135  135 - 145 mEq/L   Potassium 3.4 (*) 3.5 - 5.1 mEq/L   Chloride 99  96 - 112 mEq/L   CO2 25  19 - 32 mEq/L   Glucose, Bld 136 (*) 70 - 99 mg/dL   BUN 19  6 - 23 mg/dL   Creatinine, Ser 1.17 (*) 0.50 - 1.10 mg/dL   Calcium 8.7  8.4 - 10.5 mg/dL   Total Protein 6.9  6.0 - 8.3 g/dL   Albumin 2.5 (*) 3.5 - 5.2 g/dL   AST 18  0 - 37 U/L   ALT 16  0 - 35 U/L   Alkaline Phosphatase 139 (*) 39 - 117 U/L   Total Bilirubin 0.1 (*) 0.3 - 1.2 mg/dL   GFR calc non Af Amer 57 (*) >90 mL/min   GFR calc Af Amer 66 (*) >90 mL/min  URINALYSIS, ROUTINE W REFLEX MICROSCOPIC      Result Value Range   Color, Urine ORANGE (*) YELLOW   APPearance CLEAR  CLEAR   Specific Gravity, Urine 1.020  1.005 - 1.030   pH 6.0  5.0 - 8.0   Glucose, UA 100 (*) NEGATIVE mg/dL   Hgb urine dipstick LARGE (*) NEGATIVE   Bilirubin Urine NEGATIVE  NEGATIVE   Ketones, ur NEGATIVE  NEGATIVE mg/dL   Protein, ur >300 (*) NEGATIVE mg/dL   Urobilinogen, UA 4.0 (*) 0.0 - 1.0 mg/dL   Nitrite POSITIVE (*) NEGATIVE   Leukocytes, UA SMALL  (*) NEGATIVE  URINE MICROSCOPIC-ADD ON      Result Value Range   Squamous Epithelial / LPF FEW (*) RARE   WBC, UA TOO NUMEROUS TO COUNT  <3 WBC/hpf   RBC / HPF TOO NUMEROUS TO COUNT  <3 RBC/hpf   Bacteria, UA MANY (*) RARE  PROTIME-INR      Result Value Range   Prothrombin Time 17.4 (*) 11.6 - 15.2 seconds   INR 1.47  0.00 - 1.49     Medications  ciprofloxacin (CIPRO) tablet 500 mg (not administered)  phenazopyridine (PYRIDIUM) tablet 200 mg (not administered)  HYDROmorphone (DILAUDID) injection 1 mg (1 mg Intravenous Given 06/07/12 0352)  ondansetron (ZOFRAN) injection 4 mg (4 mg Intravenous Given 06/07/12 0353)    0500 Patient feeling better after dilaudid and zofran.  MDM  Patient with urinary symptoms and pain. Given dilaudid and zofran. UA positive for infection. Given Cipro  and pyridium. PT is sub therapeutic. Reviewed results with the patient. Pt stable in ED with no significant deterioration in condition.The patient appears reasonably screened and/or stabilized for discharge and I doubt any other medical condition or other [facility] requiring further screening, evaluation, or treatment in the ED at this time prior to discharge.  MDM Reviewed: nursing note and vitals Interpretation: labs           Gypsy Balsam. Olin Hauser, MD 06/07/12 [place]. Olin Hauser, MD 06/07/12 [phone]

## 2012-06-07 NOTE — ED Notes (Signed)
Pt complaining of pelvic pain & having the urge to go to the restroom. Pain in her back also. Pt states vomiting started about 1 hour ago.

## 2012-06-09 ENCOUNTER — Telehealth (HOSPITAL_COMMUNITY): Payer: Self-pay | Admitting: Emergency Medicine

## 2012-06-09 LAB — URINE CULTURE: Colony Count: >=100000

## 2012-06-09 NOTE — ED Notes (Signed)
Post ED Visit - Positive Culture Follow-up  Culture report reviewed by antimicrobial stewardship pharmacist: []  Wes [place], Pharm.D., BCPS [x]  Heide Guile, Pharm.D., BCPS []  Alycia Rossetti, Pharm.D., BCPS []  [place], Pharm.D., BCPS, AAHIVP []  Legrand Como, Pharm.D., BCPS, AAHIV  Positive urine culture Treated with Cipro, organism sensitive to the same and no further patient follow-up is required at this time.  Kylie A Holland 06/09/2012, 5:56 PM

## 2012-06-10 ENCOUNTER — Telehealth (HOSPITAL_COMMUNITY): Payer: Self-pay | Admitting: Emergency Medicine

## 2012-06-10 NOTE — ED Notes (Signed)
Post ED Visit - Positive Culture Follow-up  Culture report reviewed by antimicrobial stewardship pharmacist: []  Wes [place], Pharm.D., BCPS [x]  Heide Guile, Pharm.D., BCPS []  Alycia Rossetti, Pharm.D., BCPS []  [place], Pharm.D., BCPS, AAHIVP []  Legrand Como, Pharm.D., BCPS, AAHIV  Positive urine culture Treated with Cipro, organism sensitive to the same and no further patient follow-up is required at this time.  Kylie A Holland 06/10/2012, 4:18 PM

## 2012-08-12 IMAGING — US US EXTREM  UP VENOUS*R*
1 series · 14 of 24 positions shown · non-contrast
Comparison: None.

CLINICAL DATA: Right upper extremity pain and swelling.  Numbness
in the fingers of the right hand.

RIGHT UPPER EXTREMITY VENOUS DOPPLER ULTRASOUND 03/31/2011:
TECHNIQUE: Gray-scale sonography with compression, as well as
color and duplex Doppler ultrasound, were performed to evaluate the
deep venous system from the level of  the elbow centrally to
include the subclavian vein.  The right internal jugular vein was
also evaluated.  Evaluation also included physiologic response to
augmentation.

[Series 1: us extrem up venous*right* · 32 acquisitions, 14 frames shown]
[im 1/32]
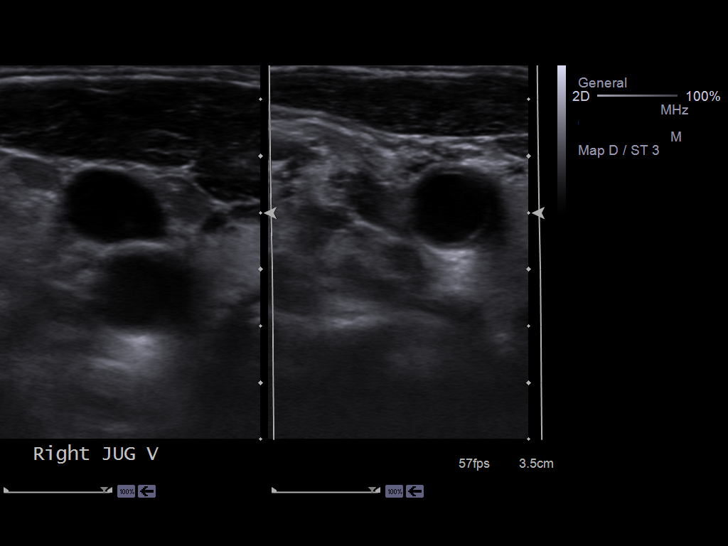
[im 3/32]
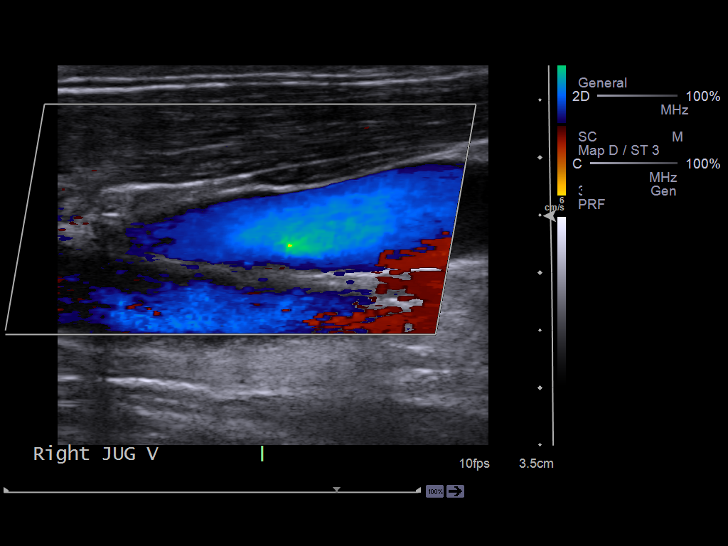
[im 6/32]
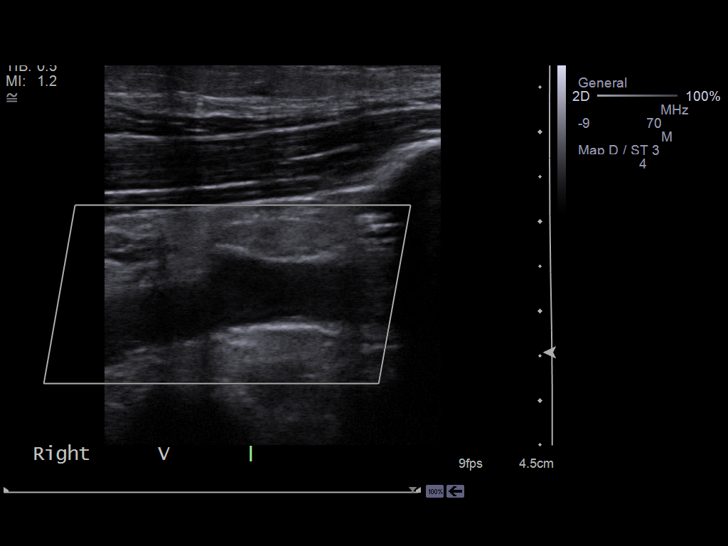
[im 10/32]
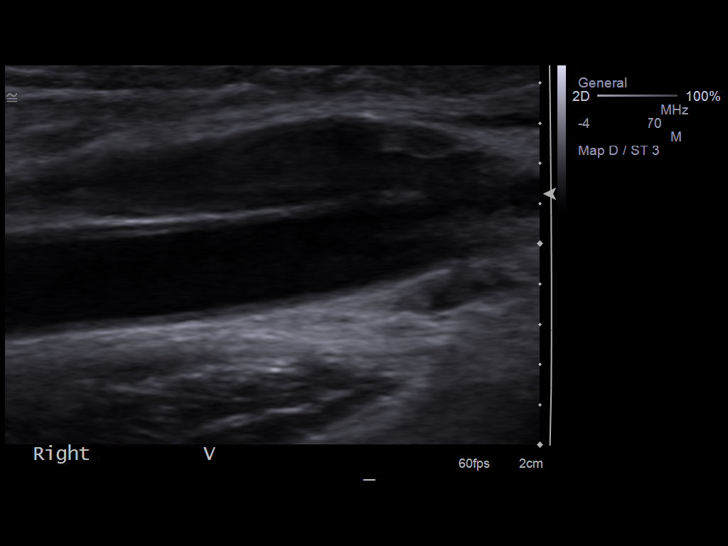
[im 10/32]
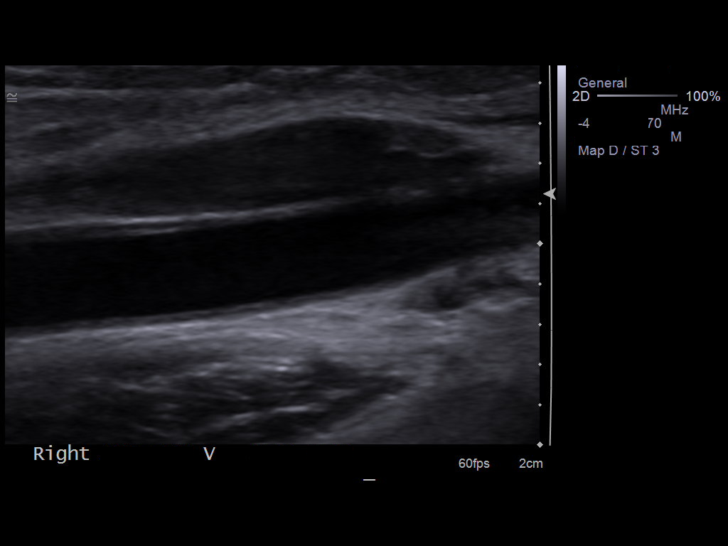
[im 13/32]
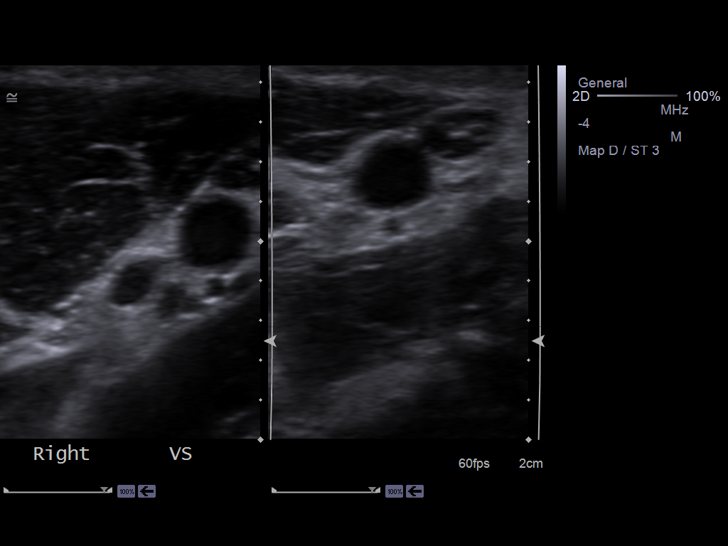
[im 15/32]
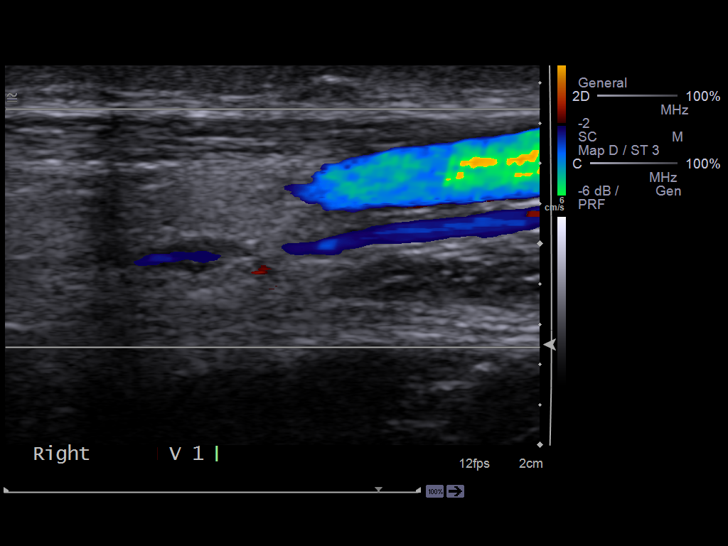
[im 17/32]
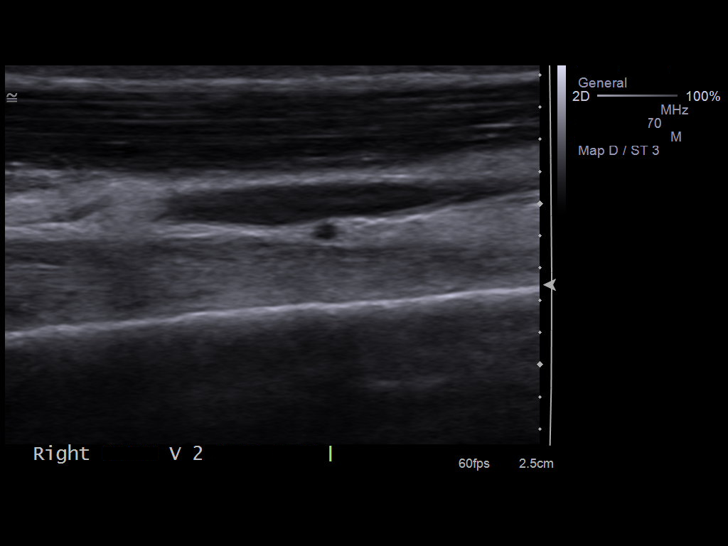
[im 18/32]
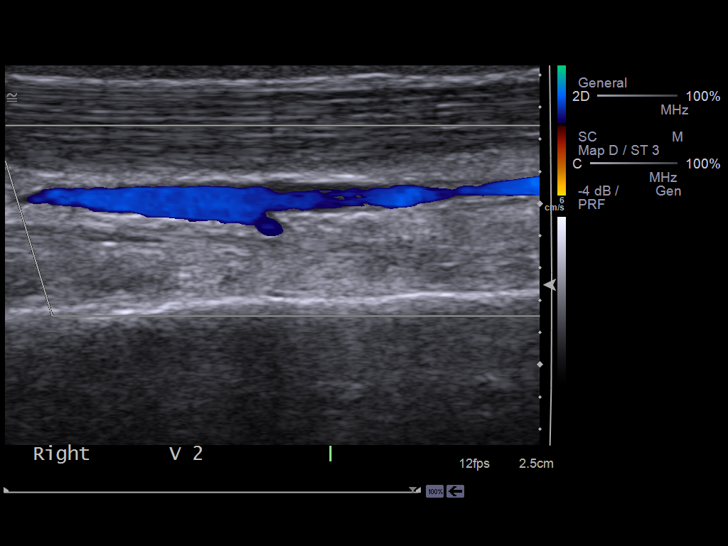
[im 21/32]
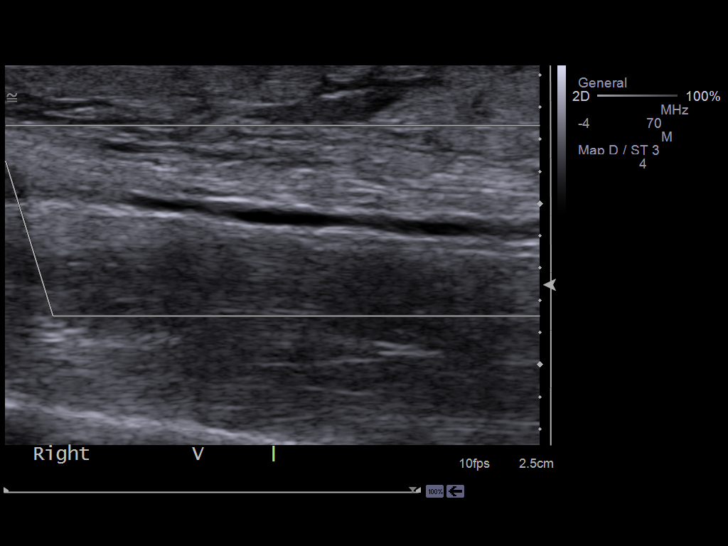
[im 25/32]
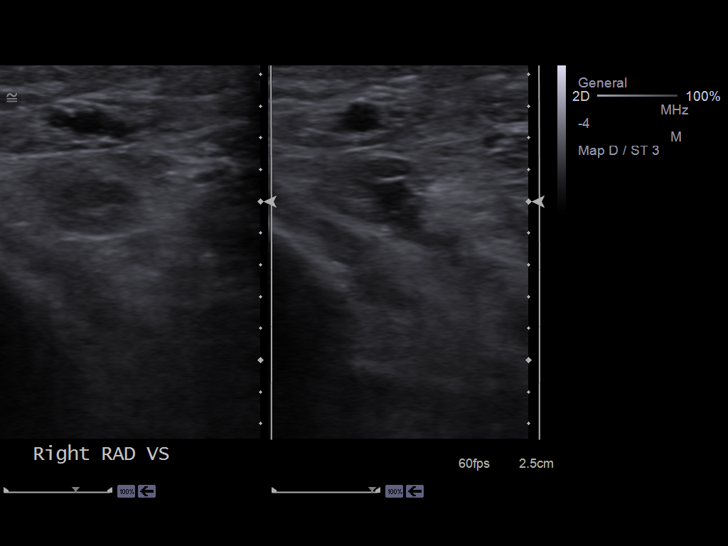
[im 26/32]
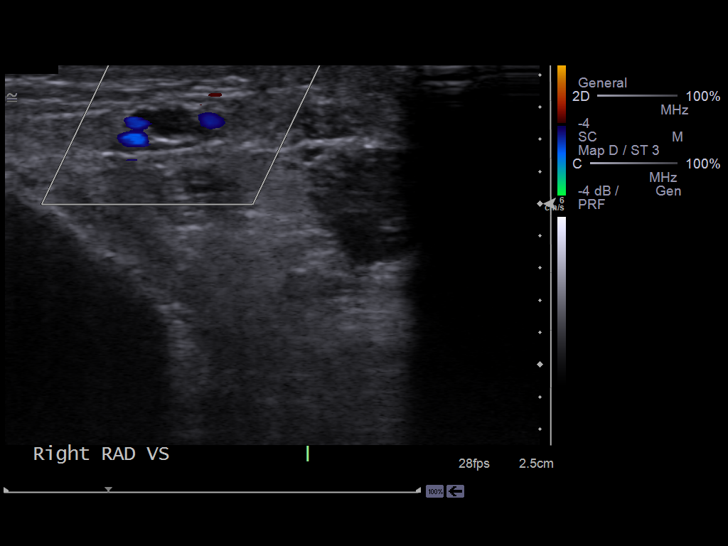
[im 29/32]
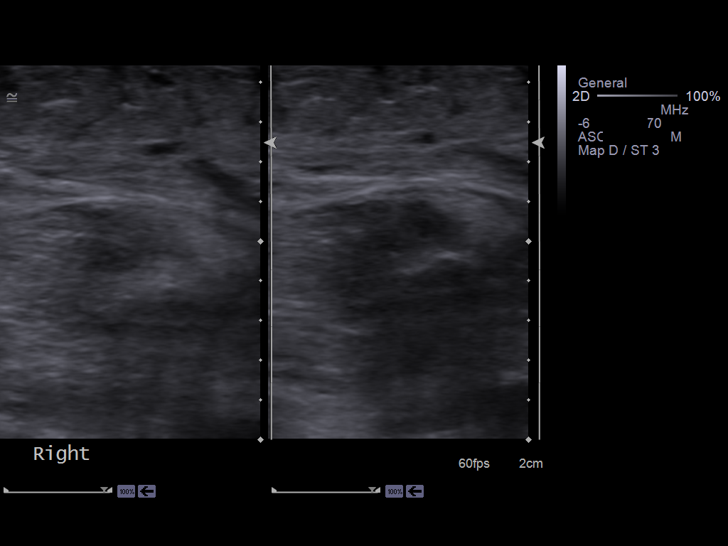
[im 32/32]
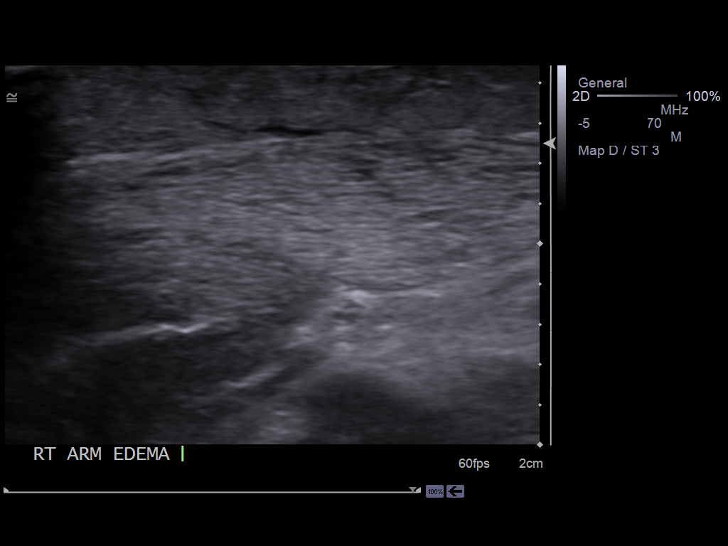

[14 of 24 positions shown; findings below may reference images not displayed]

FINDINGS: All of the visualized deep veins in the right upper
extremity demonstrate normal Doppler signal, normal
compressibility, normal phasicity, and normal physiologic response
to augmentation.  No gray scale filling defects were identified.
The right internal jugular vein is similarly patent.  Note is made
of sluggish flow in the brachial and axillary veins.
IMPRESSION: No evidence of right upper extremity DVT.

## 2012-08-17 IMAGING — CR DG FOOT COMPLETE 3+V*L*
3 series · 3 of 3 positions shown · non-contrast
Comparison: None.

CLINICAL DATA: History of blood clot left leg.  Fall.

LEFT FOOT - COMPLETE 3+ VIEW

[view not recorded (1 of 3)]
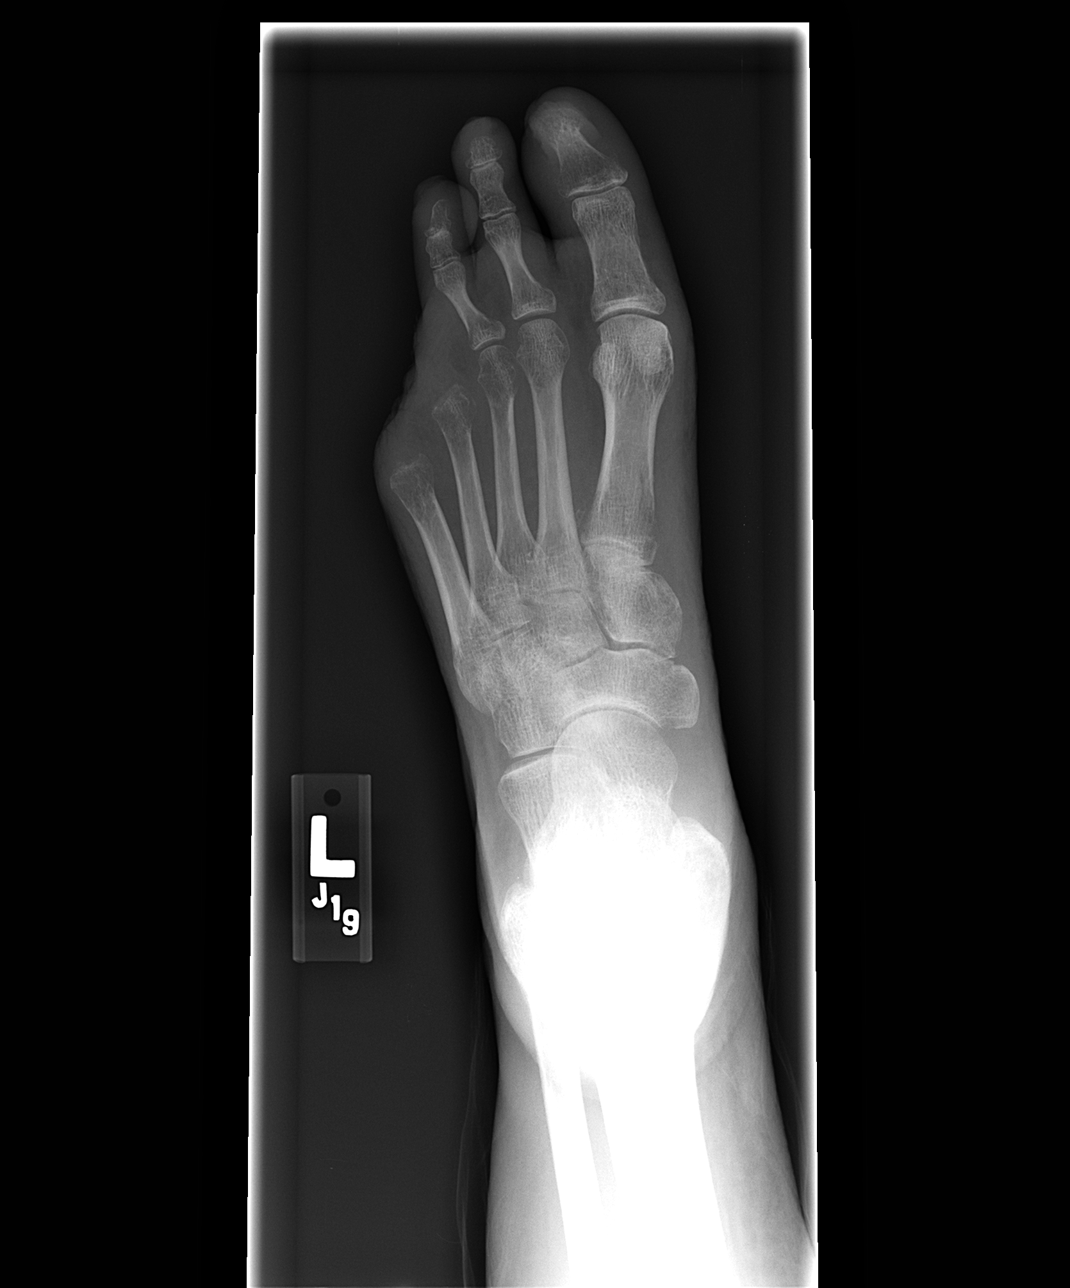

[view not recorded (2 of 3)]
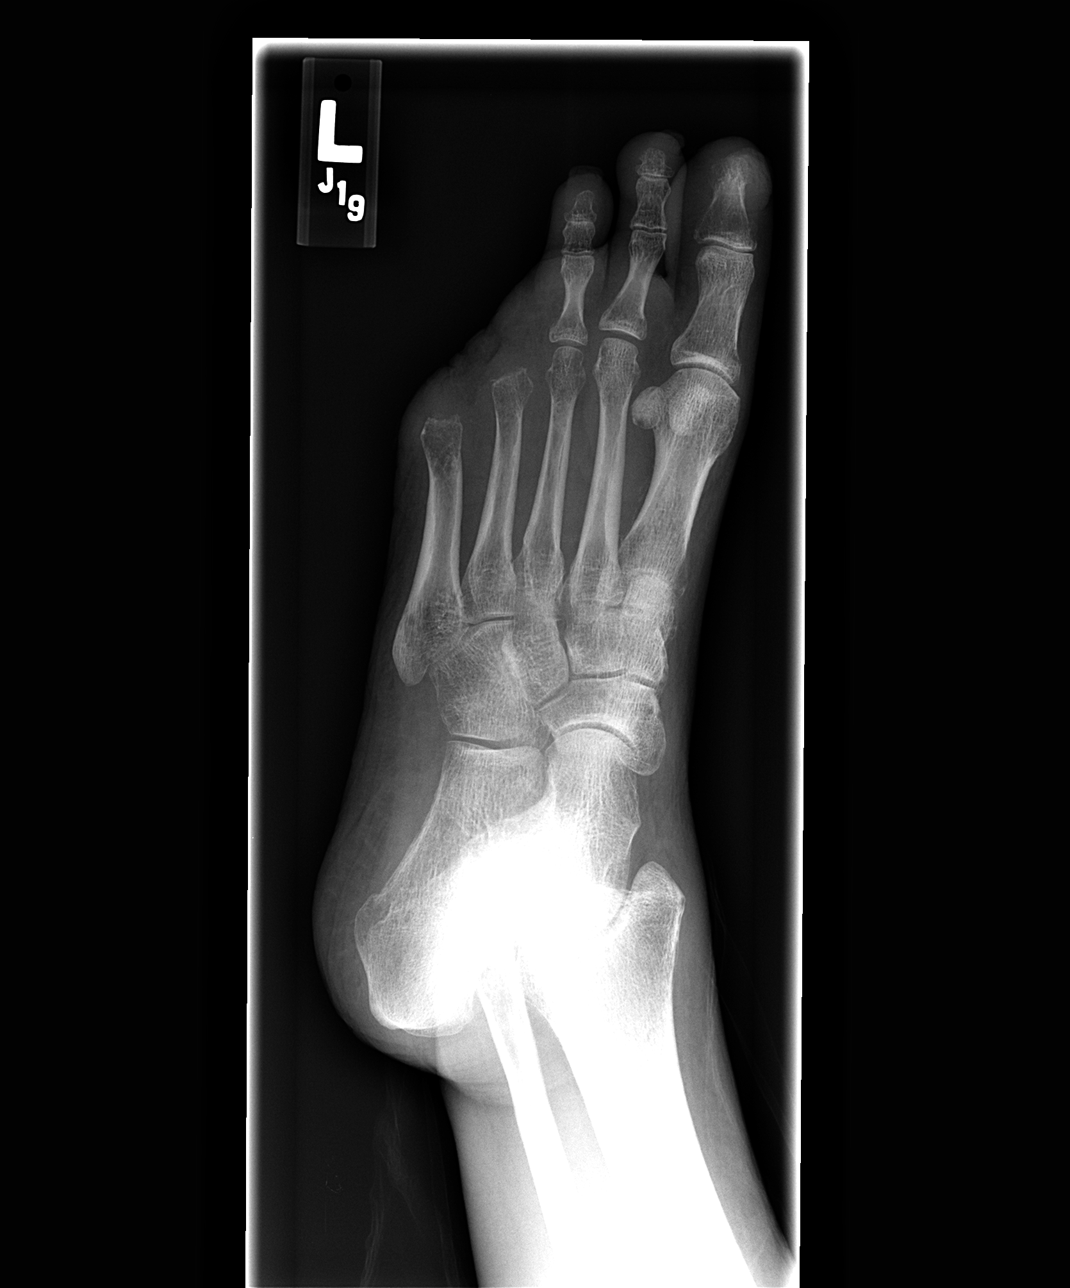

[view not recorded (3 of 3)]
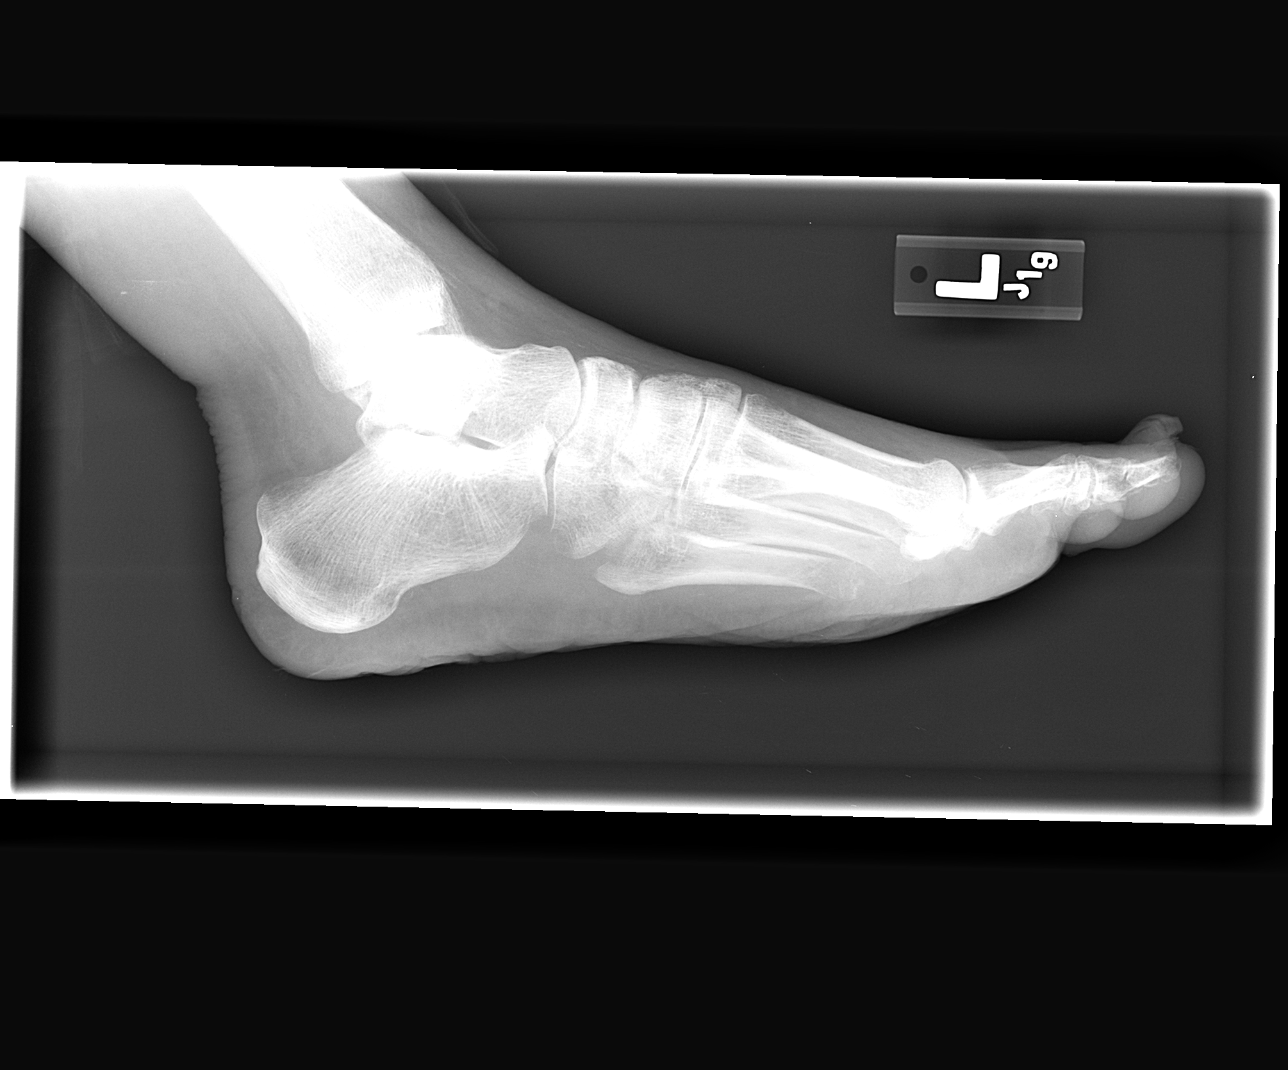

[3 of 3 positions shown; findings below may reference images not displayed]

FINDINGS: There is been previous amputation of the left fourth and
fifth toes at the metatarsal phalangeal joint level. There is an
erosion associated with the dorsal portion of the cuboid bone seen
on the lateral view.. There is a possible subtle fracture of the
medial aspect of the navicular bone versus variant anatomy.
Recommend correlation with physical examination findings and
possibly CT.
IMPRESSION: Previous amputations of the left fourth and fifth toes at the
metatarsophalangeal joint levels. Possible fracture of the
navicular bone versus variant anatomy.  Suggest correlation with
physical examination findings and possibly CT if felt to be
indicated. Erosion associated with the cuboid bone as discussed
above.

## 2012-08-17 IMAGING — CR DG HUMERUS 2V *R*
2 series · 2 of 2 positions shown · non-contrast
Comparison: None.

CLINICAL DATA: Recent fall, hyperglycemia

RIGHT HUMERUS - 2+ VIEW

[view not recorded (1 of 2)]
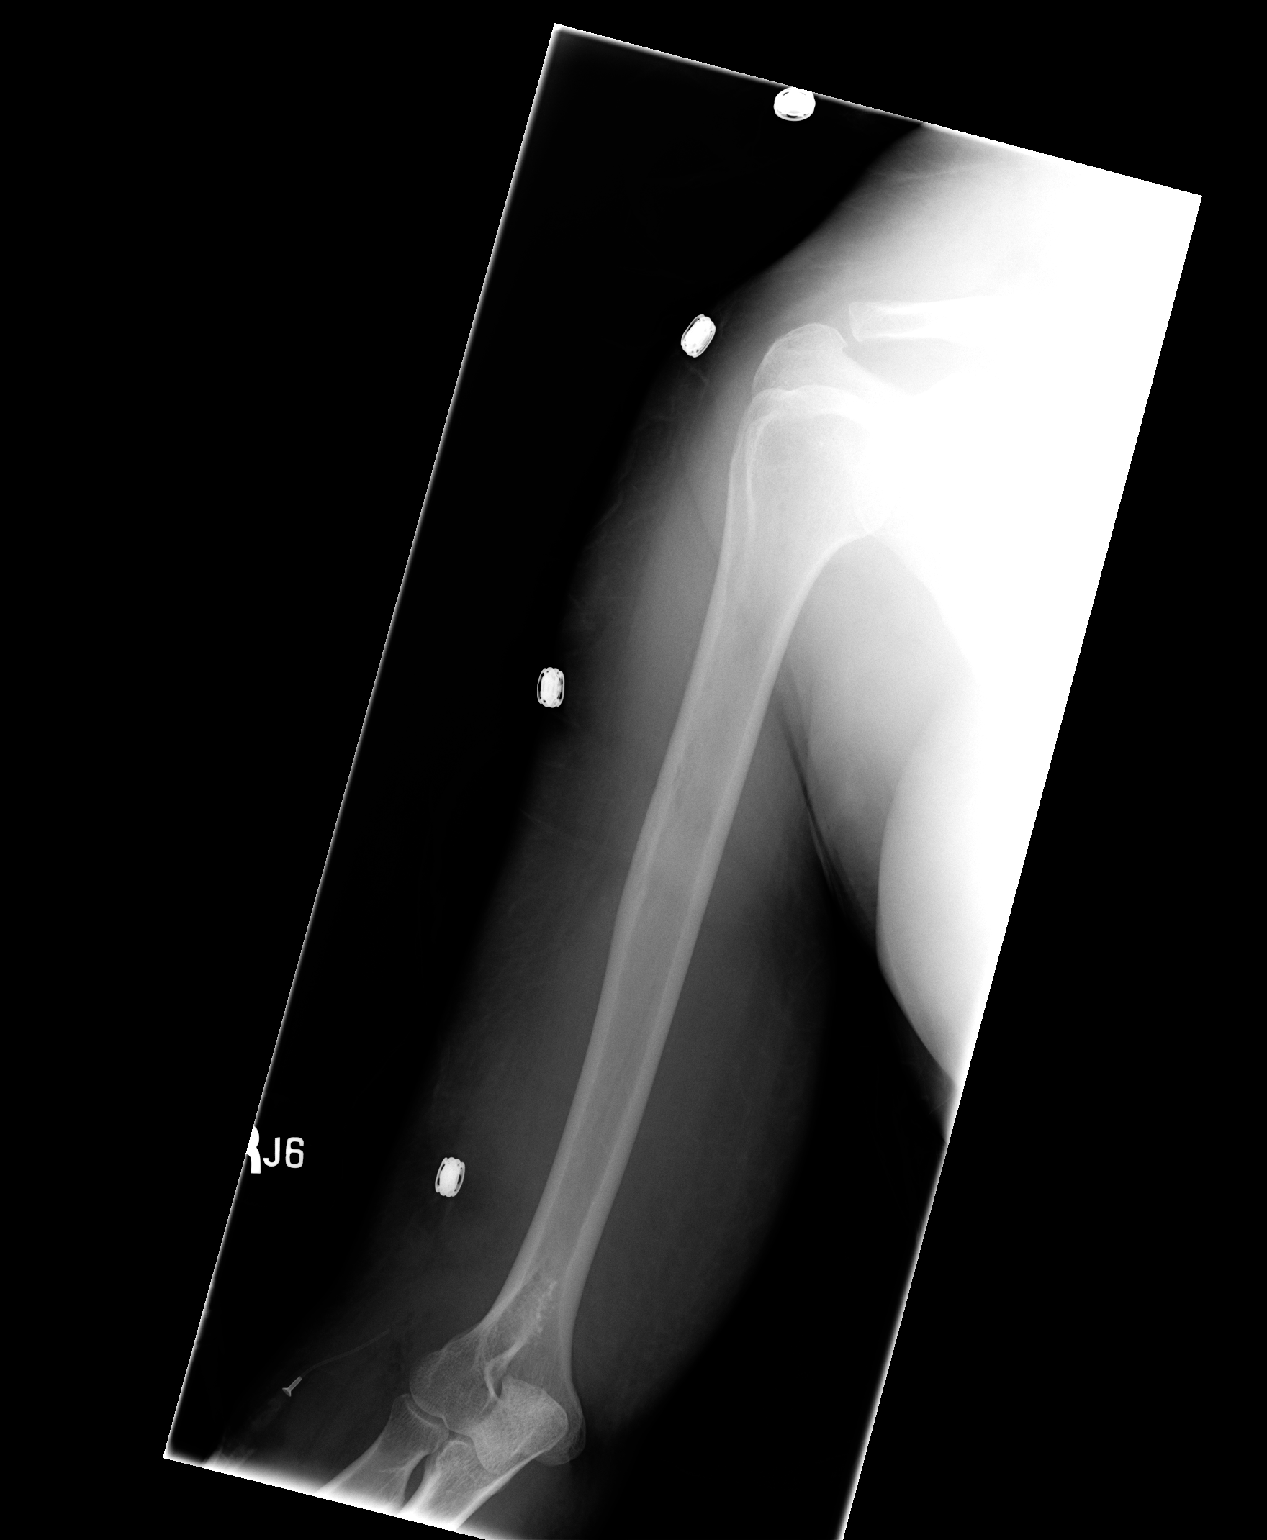

[view not recorded (2 of 2)]
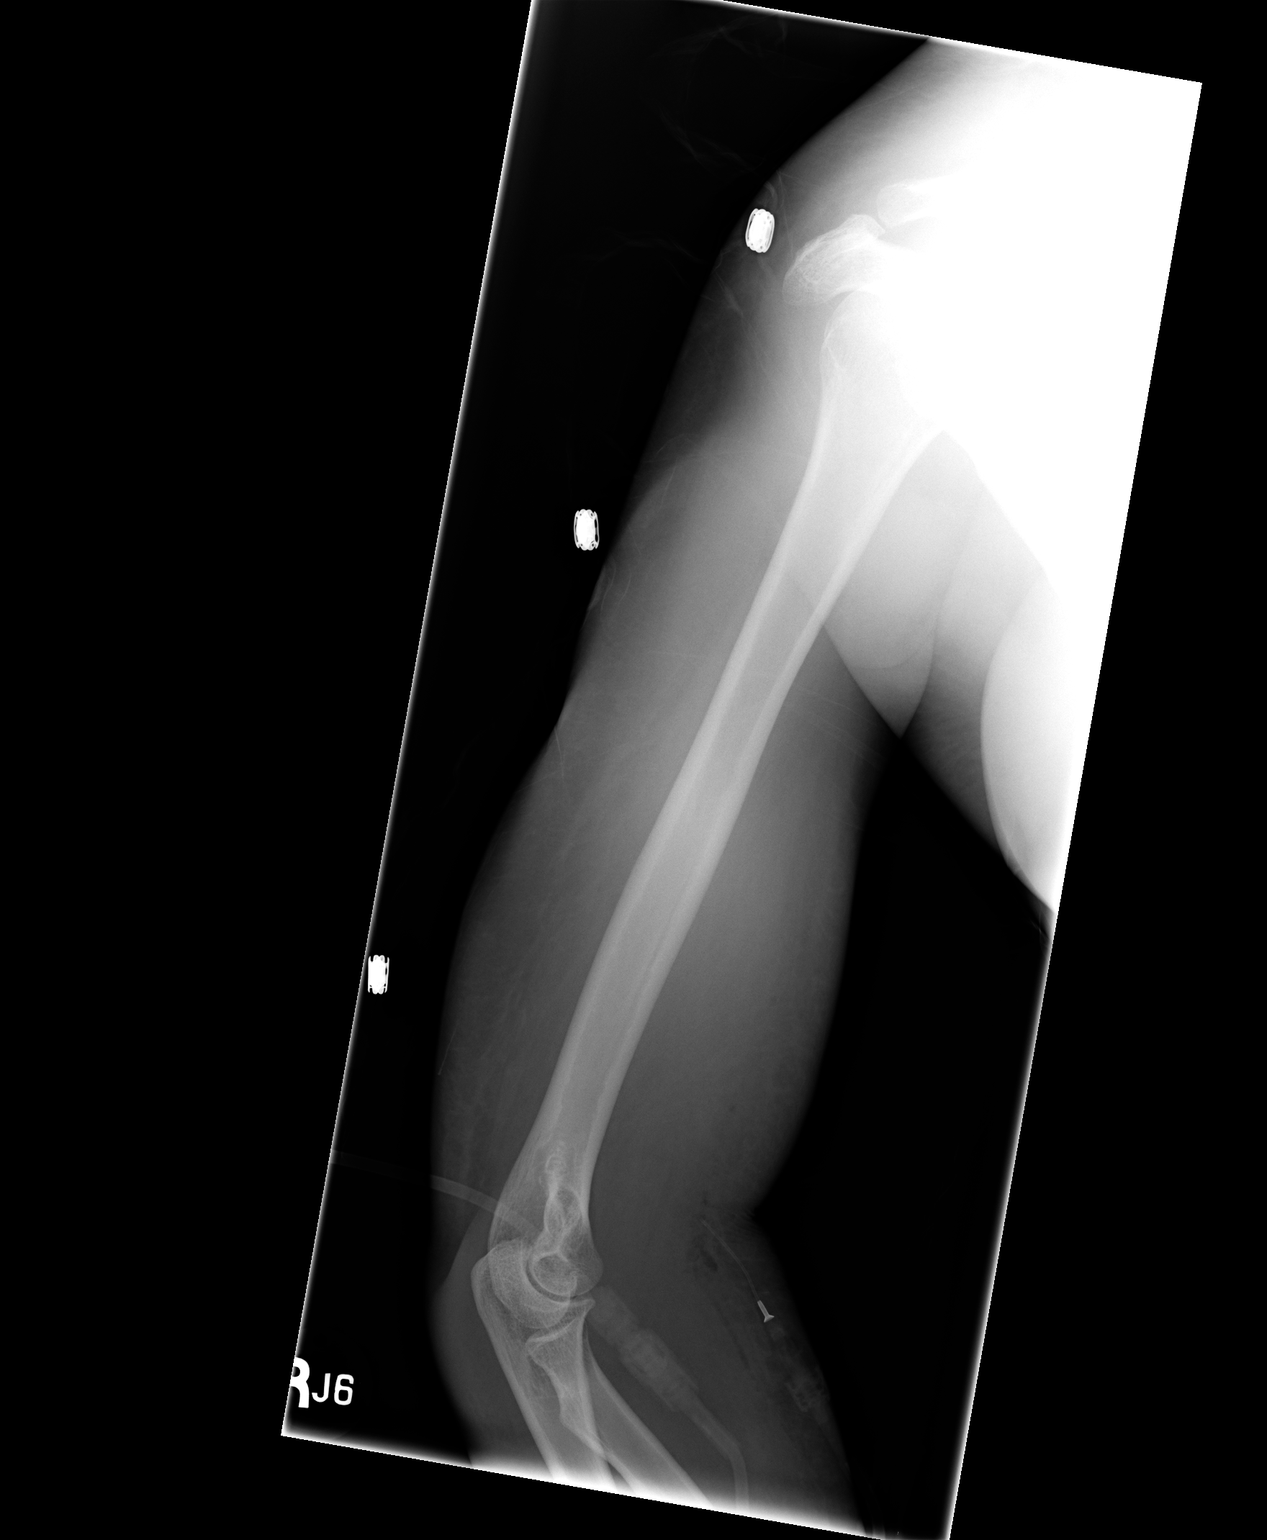

[2 of 2 positions shown; findings below may reference images not displayed]

FINDINGS: No acute fracture is seen.  The right shoulder is
unremarkable other than a somewhat downward sloping acromion.
IMPRESSION: No acute fracture.

## 2012-09-30 ENCOUNTER — Emergency Department (HOSPITAL_COMMUNITY): Payer: Medicaid Other

## 2012-09-30 ENCOUNTER — Encounter (HOSPITAL_COMMUNITY): Payer: Self-pay | Admitting: Emergency Medicine

## 2012-09-30 ENCOUNTER — Inpatient Hospital Stay (HOSPITAL_COMMUNITY)
Admission: EM | Admit: 2012-09-30 | Discharge: 2012-10-04 | DRG: 981 | Disposition: A | Discharge status: Home / Self Care | Payer: Medicaid Other | Attending: Internal Medicine | Admitting: Internal Medicine

## 2012-09-30 DIAGNOSIS — N189 Chronic kidney disease, unspecified: Secondary | ICD-10-CM | POA: Diagnosis present

## 2012-09-30 DIAGNOSIS — I2109 ST elevation (STEMI) myocardial infarction involving other coronary artery of anterior wall: Secondary | ICD-10-CM | POA: Diagnosis present

## 2012-09-30 DIAGNOSIS — E1110 Type 2 diabetes mellitus with ketoacidosis without coma: Secondary | ICD-10-CM | POA: Diagnosis present

## 2012-09-30 DIAGNOSIS — I739 Peripheral vascular disease, unspecified: Secondary | ICD-10-CM | POA: Diagnosis present

## 2012-09-30 DIAGNOSIS — IMO0002 Reserved for concepts with insufficient information to code with codable children: Secondary | ICD-10-CM | POA: Diagnosis present

## 2012-09-30 DIAGNOSIS — R410 Disorientation, unspecified: Secondary | ICD-10-CM

## 2012-09-30 DIAGNOSIS — Z794 Long term (current) use of insulin: Secondary | ICD-10-CM

## 2012-09-30 DIAGNOSIS — F329 Major depressive disorder, single episode, unspecified: Secondary | ICD-10-CM | POA: Diagnosis present

## 2012-09-30 DIAGNOSIS — F17200 Nicotine dependence, unspecified, uncomplicated: Secondary | ICD-10-CM | POA: Diagnosis present

## 2012-09-30 DIAGNOSIS — D649 Anemia, unspecified: Secondary | ICD-10-CM | POA: Diagnosis present

## 2012-09-30 DIAGNOSIS — Z823 Family history of stroke: Secondary | ICD-10-CM

## 2012-09-30 DIAGNOSIS — E785 Hyperlipidemia, unspecified: Secondary | ICD-10-CM | POA: Diagnosis present

## 2012-09-30 DIAGNOSIS — E119 Type 2 diabetes mellitus without complications: Secondary | ICD-10-CM

## 2012-09-30 DIAGNOSIS — G9340 Encephalopathy, unspecified: Secondary | ICD-10-CM | POA: Diagnosis present

## 2012-09-30 DIAGNOSIS — N179 Acute kidney failure, unspecified: Secondary | ICD-10-CM | POA: Diagnosis not present

## 2012-09-30 DIAGNOSIS — I70209 Unspecified atherosclerosis of native arteries of extremities, unspecified extremity: Secondary | ICD-10-CM | POA: Diagnosis present

## 2012-09-30 DIAGNOSIS — R443 Hallucinations, unspecified: Secondary | ICD-10-CM | POA: Diagnosis present

## 2012-09-30 DIAGNOSIS — Z7982 Long term (current) use of aspirin: Secondary | ICD-10-CM

## 2012-09-30 DIAGNOSIS — Z8249 Family history of ischemic heart disease and other diseases of the circulatory system: Secondary | ICD-10-CM

## 2012-09-30 DIAGNOSIS — R778 Other specified abnormalities of plasma proteins: Secondary | ICD-10-CM

## 2012-09-30 DIAGNOSIS — F1210 Cannabis abuse, uncomplicated: Secondary | ICD-10-CM | POA: Diagnosis present

## 2012-09-30 DIAGNOSIS — Z86718 Personal history of other venous thrombosis and embolism: Secondary | ICD-10-CM

## 2012-09-30 DIAGNOSIS — Z836 Family history of other diseases of the respiratory system: Secondary | ICD-10-CM

## 2012-09-30 DIAGNOSIS — I219 Acute myocardial infarction, unspecified: Secondary | ICD-10-CM | POA: Diagnosis present

## 2012-09-30 DIAGNOSIS — R9431 Abnormal electrocardiogram [ECG] [EKG]: Secondary | ICD-10-CM

## 2012-09-30 DIAGNOSIS — E1065 Type 1 diabetes mellitus with hyperglycemia: Secondary | ICD-10-CM | POA: Diagnosis present

## 2012-09-30 DIAGNOSIS — Z886 Allergy status to analgesic agent status: Secondary | ICD-10-CM

## 2012-09-30 DIAGNOSIS — Z833 Family history of diabetes mellitus: Secondary | ICD-10-CM

## 2012-09-30 DIAGNOSIS — Z9229 Personal history of other drug therapy: Secondary | ICD-10-CM

## 2012-09-30 DIAGNOSIS — I129 Hypertensive chronic kidney disease with stage 1 through stage 4 chronic kidney disease, or unspecified chronic kidney disease: Secondary | ICD-10-CM | POA: Diagnosis present

## 2012-09-30 DIAGNOSIS — R7989 Other specified abnormal findings of blood chemistry: Secondary | ICD-10-CM

## 2012-09-30 DIAGNOSIS — IMO0001 Reserved for inherently not codable concepts without codable children: Secondary | ICD-10-CM | POA: Diagnosis present

## 2012-09-30 DIAGNOSIS — F411 Generalized anxiety disorder: Secondary | ICD-10-CM | POA: Diagnosis present

## 2012-09-30 DIAGNOSIS — Z8673 Personal history of transient ischemic attack (TIA), and cerebral infarction without residual deficits: Secondary | ICD-10-CM | POA: Diagnosis present

## 2012-09-30 DIAGNOSIS — E876 Hypokalemia: Secondary | ICD-10-CM | POA: Diagnosis not present

## 2012-09-30 DIAGNOSIS — E875 Hyperkalemia: Secondary | ICD-10-CM

## 2012-09-30 DIAGNOSIS — R000 Tachycardia, unspecified: Secondary | ICD-10-CM

## 2012-09-30 DIAGNOSIS — G9349 Other encephalopathy: Secondary | ICD-10-CM | POA: Diagnosis present

## 2012-09-30 DIAGNOSIS — R197 Diarrhea, unspecified: Secondary | ICD-10-CM | POA: Diagnosis not present

## 2012-09-30 DIAGNOSIS — Z79899 Other long term (current) drug therapy: Secondary | ICD-10-CM

## 2012-09-30 DIAGNOSIS — E1059 Type 1 diabetes mellitus with other circulatory complications: Secondary | ICD-10-CM | POA: Diagnosis present

## 2012-09-30 DIAGNOSIS — I743 Embolism and thrombosis of arteries of the lower extremities: Secondary | ICD-10-CM | POA: Diagnosis present

## 2012-09-30 DIAGNOSIS — Z720 Tobacco use: Secondary | ICD-10-CM

## 2012-09-30 DIAGNOSIS — F3289 Other specified depressive episodes: Secondary | ICD-10-CM | POA: Diagnosis present

## 2012-09-30 DIAGNOSIS — E1010 Type 1 diabetes mellitus with ketoacidosis without coma: Principal | ICD-10-CM | POA: Diagnosis present

## 2012-09-30 DIAGNOSIS — S88119A Complete traumatic amputation at level between knee and ankle, unspecified lower leg, initial encounter: Secondary | ICD-10-CM

## 2012-09-30 LAB — URINALYSIS, ROUTINE W REFLEX MICROSCOPIC
Bilirubin Urine: NEGATIVE
Glucose, UA: 500 mg/dL — AB
Ketones, ur: >80 mg/dL — AB
Leukocytes, UA: NEGATIVE
Nitrite: NEGATIVE
Protein, ur: 100 mg/dL — AB
Specific Gravity, Urine: 1.025 (ref 1.005–1.030)
Urobilinogen, UA: 0.2 mg/dL (ref 0.0–1.0)
pH: 6 (ref 5.0–8.0)

## 2012-09-30 LAB — COMPREHENSIVE METABOLIC PANEL
ALT: 10 U/L (ref 0–35)
AST: 12 U/L (ref 0–37)
Albumin: 2.4 g/dL — ABNORMAL LOW (ref 3.5–5.2)
Alkaline Phosphatase: 92 U/L (ref 39–117)
BUN: 25 mg/dL — ABNORMAL HIGH (ref 6–23)
CO2: 5 mEq/L — CL (ref 19–32)
Calcium: 8.7 mg/dL (ref 8.4–10.5)
Chloride: 95 mEq/L — ABNORMAL LOW (ref 96–112)
Creatinine, Ser: 1.23 mg/dL — ABNORMAL HIGH (ref 0.50–1.10)
GFR calc Af Amer: 61 mL/min — ABNORMAL LOW (ref >90)
GFR calc non Af Amer: 53 mL/min — ABNORMAL LOW (ref >90)
Glucose, Bld: 792 mg/dL (ref 70–99)
Potassium: 5.2 mEq/L — ABNORMAL HIGH (ref 3.5–5.1)
Sodium: 132 mEq/L — ABNORMAL LOW (ref 135–145)
Total Bilirubin: 0.2 mg/dL — ABNORMAL LOW (ref 0.3–1.2)
Total Protein: 6.3 g/dL (ref 6.0–8.3)

## 2012-09-30 LAB — BASIC METABOLIC PANEL
BUN: 23 mg/dL (ref 6–23)
CO2: 8 mEq/L — CL (ref 19–32)
Calcium: 8.4 mg/dL (ref 8.4–10.5)
Chloride: 100 mEq/L (ref 96–112)
Creatinine, Ser: 1.24 mg/dL — ABNORMAL HIGH (ref 0.50–1.10)
GFR calc Af Amer: 61 mL/min — ABNORMAL LOW (ref >90)
GFR calc non Af Amer: 52 mL/min — ABNORMAL LOW (ref >90)
Glucose, Bld: 476 mg/dL — ABNORMAL HIGH (ref 70–99)
Potassium: 3.9 mEq/L (ref 3.5–5.1)
Sodium: 140 mEq/L (ref 135–145)

## 2012-09-30 LAB — CBC WITH DIFFERENTIAL/PLATELET
Band Neutrophils: 0 % (ref 0–10)
Basophils Absolute: 0 10*3/uL (ref 0.0–0.1)
Basophils Relative: 0 % (ref 0–1)
Blasts: 0 %
Eosinophils Absolute: 0 10*3/uL (ref 0.0–0.7)
Eosinophils Relative: 0 % (ref 0–5)
HCT: 38.9 % (ref 36.0–46.0)
Hemoglobin: 11.9 g/dL — ABNORMAL LOW (ref 12.0–15.0)
Lymphocytes Relative: 5 % — ABNORMAL LOW (ref 12–46)
Lymphs Abs: 0.9 10*3/uL (ref 0.7–4.0)
MCH: 29.6 pg (ref 26.0–34.0)
MCHC: 30.6 g/dL (ref 30.0–36.0)
MCV: 96.8 fL (ref 78.0–100.0)
Metamyelocytes Relative: 0 %
Monocytes Absolute: 0.9 10*3/uL (ref 0.1–1.0)
Monocytes Relative: 5 % (ref 3–12)
Myelocytes: 0 %
Neutro Abs: 15.6 10*3/uL — ABNORMAL HIGH (ref 1.7–7.7)
Neutrophils Relative %: 90 % — ABNORMAL HIGH (ref 43–77)
Platelets: 510 10*3/uL — ABNORMAL HIGH (ref 150–400)
Promyelocytes Absolute: 0 %
RBC: 4.02 MIL/uL (ref 3.87–5.11)
RDW: 15.4 % (ref 11.5–15.5)
WBC: 17.4 10*3/uL — ABNORMAL HIGH (ref 4.0–10.5)
nRBC: 0 /100 WBC

## 2012-09-30 LAB — URINE MICROSCOPIC-ADD ON

## 2012-09-30 LAB — AMMONIA: Ammonia: 49 umol/L (ref 11–60)

## 2012-09-30 LAB — TROPONIN I
Troponin I: 0.33 ng/mL (ref <0.30)
Troponin I: 7.24 ng/mL (ref <0.30)

## 2012-09-30 LAB — APTT: aPTT: 25 seconds (ref 24–37)

## 2012-09-30 LAB — RAPID URINE DRUG SCREEN, HOSP PERFORMED
Amphetamines: NOT DETECTED
Barbiturates: NOT DETECTED
Benzodiazepines: POSITIVE — AB
Cocaine: NOT DETECTED
Opiates: NOT DETECTED
Tetrahydrocannabinol: NOT DETECTED

## 2012-09-30 LAB — GLUCOSE, CAPILLARY
Glucose-Capillary: 340 mg/dL — ABNORMAL HIGH (ref 70–99)
Glucose-Capillary: 497 mg/dL — ABNORMAL HIGH (ref 70–99)
Glucose-Capillary: 583 mg/dL (ref 70–99)
Glucose-Capillary: >600 mg/dL (ref 70–99)

## 2012-09-30 LAB — ETHANOL: Alcohol, Ethyl (B): <11 mg/dL (ref 0–11)

## 2012-09-30 LAB — PROTIME-INR
INR: 1.23 (ref 0.00–1.49)
Prothrombin Time: 15.2 seconds (ref 11.6–15.2)

## 2012-09-30 MED ORDER — SODIUM BICARBONATE 8.4 % IV SOLN
INTRAVENOUS | Status: AC
  Filled 2012-09-30: qty 50

## 2012-09-30 MED ORDER — ASPIRIN 300 MG RE SUPP
300.0000 mg | Freq: Once | RECTAL | Status: AC
  Administered 2012-10-01: 300 mg via RECTAL
  Filled 2012-09-30: qty 1

## 2012-09-30 MED ORDER — SODIUM CHLORIDE 0.9 % IV SOLN: 1000.0000 mL | Freq: Once | INTRAVENOUS | Status: DC

## 2012-09-30 MED ORDER — SODIUM BICARBONATE 8.4 % IV SOLN
100.0000 meq | Freq: Once | INTRAVENOUS | Status: AC
  Administered 2012-09-30: 100 meq via INTRAVENOUS
  Filled 2012-09-30: qty 50

## 2012-09-30 MED ORDER — SODIUM CHLORIDE 0.9 % IV SOLN
1000.0000 mL | INTRAVENOUS | Status: DC
  Administered 2012-09-30: 1000 mL via INTRAVENOUS

## 2012-09-30 MED ORDER — SODIUM CHLORIDE 0.9 % IV SOLN
1000.0000 mL | Freq: Once | INTRAVENOUS | Status: AC
  Administered 2012-09-30: 1000 mL via INTRAVENOUS

## 2012-09-30 MED ORDER — LORAZEPAM 2 MG/ML IJ SOLN
0.5000 mg | Freq: Once | INTRAMUSCULAR | Status: AC
  Administered 2012-09-30: 0.5 mg via INTRAVENOUS

## 2012-09-30 MED ORDER — INSULIN REGULAR HUMAN 100 UNIT/ML IJ SOLN
INTRAMUSCULAR | Status: AC
  Filled 2012-09-30: qty 3

## 2012-09-30 MED ORDER — INSULIN REGULAR HUMAN 100 UNIT/ML IJ SOLN
INTRAMUSCULAR | Status: DC
  Administered 2012-09-30: 5.4 [IU]/h via INTRAVENOUS
  Filled 2012-09-30: qty 1

## 2012-09-30 MED ORDER — LORAZEPAM 2 MG/ML IJ SOLN
INTRAMUSCULAR | Status: AC
  Filled 2012-09-30: qty 1

## 2012-09-30 MED ORDER — ONDANSETRON HCL 4 MG/2ML IJ SOLN
4.0000 mg | Freq: Once | INTRAMUSCULAR | Status: AC
  Administered 2012-09-30: 4 mg via INTRAVENOUS
  Filled 2012-09-30: qty 2

## 2012-09-30 NOTE — ED Notes (Signed)
Patient stated that she needed to use restroom, when entering the room to put patient on bedpan she had on pull up that was soiled with urine and stool. Patient did not use bedpan. Provided perineal care.

## 2012-09-30 NOTE — ED Notes (Signed)
CRITICAL VALUE ALERT  Critical value received:  Ph 6.9, pCO2 13.0, bicarb 3.0   Date of notification:  09/30/12  Time of notification:  2150  Critical value read back: Yes  Nurse who received alert:  Elwanda [place], RN  MD notified (1st page):  Dr. Tomi Bamberger  Time of first page: 2151  Responding MD:  Dr. Tomi Bamberger  Time MD responded:  2151

## 2012-09-30 NOTE — ED Notes (Signed)
CRITICAL LAB VALUES CALLED  CO2     5  (FIVE) GLUCOSE  792  DILUTED   PENNY, RN NOTIFIED  AND DR. I KNAPP NOTIFIED

## 2012-09-30 NOTE — ED Notes (Signed)
Troponin of 7.24 and co2 of 8 given to dr Nadene Rubins VALUE ALERT  Critical value received:  Trop 7.24 and co2 of 8  Date of notification: 09/30/12  Time of notification:  2348  Critical value read back: Nurse who received alert:  [email or web address]  MD notified (1st page): [place] Pink  Time of first page:  2348  MD notified (2nd page):  Time of second page:  Responding MD:  [place] Pink  Time MD responded:  2348

## 2012-09-30 NOTE — ED Provider Notes (Signed)
CSN: [id number]     Arrival date & time 09/30/12  1851 History   First MD Initiated Contact with Patient 09/30/12 1908     Chief Complaint  Patient presents with  . Altered Mental Status   Level 5 caveat for confusion  (Consider location/radiation/quality/duration/timing/severity/associated sxs/prior Treatment) HPI History obtained from patient's brother and her director at her nursing home. Director states that 3 days ago she was supposed to be getting an IV of D5 normal saline and accidentally got 1 liter at one time. She states the patient is a brittle diabetic and her blood sugar will jump from 50-500 very rapidly. She also reports earlier in the week the patient's INR was 9 and she got vitamin K twice. Two days ago the patient was vomiting and the supervisor checked her gastric contents that were negative for blood. She was given Phenergan however she vomited that and then she was given Zofran and her vomiting was controlled. She seemed fine yesterday other than she has been hallucinating all week. She reports they stopped her lyrica, narcotic and anxiety medications hoping that would help with her hallucinations however it has not. She evidently complained of some chest pain today however patient cannot tell me if she's having chest pain. The supervisor states today the patient seemed worse and was more confused than before. Her CBG read high tonight.   PCP Dr Luan Pulling [facility] physician Dr Maudie Mercury  Past Medical History  Diagnosis Date  . Shingles     11/2010  . Cellulitis     of face - 12/2010  . Peripheral vascular disease in diabetes mellitus   . Hyperlipidemia   . Tobacco abuse     0.5 - 1ppd x 21 yrs  . Headache(784.0)   . Renal disorder     glomerulonephritis  . Blood transfusion   . Anxiety   . Depression   . Peripheral vascular disease   . Diabetes mellitus     IDDM Since Age 77  . Embolism - blood clot   . Renal insufficiency    Past Surgical History  Procedure Laterality  Date  . Wrist surgery      left  . Embolectomy  01/29/2011    Procedure: EMBOLECTOMY;  Surgeon: Mal Misty, MD;  Location: [facility] OR;  Service: Vascular;  Laterality: Left;  Left Popliteal and Tibial Embolectomy with patch angioplasty  . Dilation and curettage of uterus    . Multiple tooth extractions    . Amputation  03/03/2011    Procedure: AMPUTATION DIGIT;  Surgeon: Newt Minion, MD;  Location: [place];  Service: Orthopedics;  Laterality: Left;  Left Foot Amputation 4th and 5th toes at MTP joint  . Tee without cardioversion  04/15/2011    Procedure: TRANSESOPHAGEAL ECHOCARDIOGRAM (TEE);  Surgeon: [name], MD;  Location: [facility];  Service: Cardiovascular;  Laterality: N/A;  . Amputation  04/22/2011    Procedure: AMPUTATION BELOW KNEE;  Surgeon: Newt Minion, MD;  Location: [place];  Service: Orthopedics;  Laterality: Left;  Left Below Knee Amputation   Family History  Problem Relation Age of Onset  . COPD Mother     alive - 64  . Hypertension Mother   . Diabetes Mother   . Stroke Mother   . Coronary artery disease Mother   . Diabetes Father     alive - 67  . Hypertension Father   . Coronary artery disease Father   . Anesthesia problems Neg Hx  History  Substance Use Topics  . Smoking status: Passive Smoke Exposure - Never Smoker -- 1.00 packs/day for 21 years    Types: Cigarettes    Last Attempt to Quit: 01/30/2011  . Smokeless tobacco: Never Used  . Alcohol Use: No   In Rehab for PT  OB History   Grav Para Term Preterm Abortions TAB SAB Ect Mult Living   1 1  1      1      Review of Systems  Unable to perform ROS: Mental status change    Allergies  Aspirin; Ibuprofen; and Losartan  Home Medications   Current Outpatient Rx  Name  Route  Sig  Dispense  Refill  . furosemide (LASIX) 20 MG tablet   Oral   Take 20 mg by mouth 3 (three) times a week. On Mondays, Wednesdays, and Fridays         . insulin aspart (NOVOLOG FLEXPEN) 100 UNIT/ML  injection   Subcutaneous   Inject 15 Units into the skin 3 (three) times daily before meals.         Marland Kitchen oxyCODONE-acetaminophen (PERCOCET) 10-325 MG per tablet   Oral   Take 1 tablet by mouth 4 (four) times daily.         Marland Kitchen PARoxetine (PAXIL) 20 MG tablet   Oral   Take 20 mg by mouth daily.         . potassium chloride (K-DUR,KLOR-CON) 10 MEQ tablet   Oral   Take 10 mEq by mouth daily.          BP 147/70  Pulse 147  Temp(Src) 98.9 F (37.2 C) (Oral)  Resp 18  SpO2 99%  LMP 12/28/2010  Vital signs normal except tachycardia  Physical Exam  Nursing note and vitals reviewed. Constitutional: She is oriented to person, place, and time. She appears well-developed and well-nourished.  Non-toxic appearance. She does not appear ill. No distress.  HENT:  Head: Normocephalic and atraumatic.  Right Ear: External ear normal.  Left Ear: External ear normal.  Nose: Nose normal. No mucosal edema or rhinorrhea.  Mouth/Throat: Oropharynx is clear and moist and mucous membranes are normal. No dental abscesses or edematous.  Dry tongue  Eyes: Conjunctivae and EOM are normal. Pupils are equal, round, and reactive to light.  Neck: Normal range of motion and full passive range of motion without pain. Neck supple.  Cardiovascular: Normal rate, regular rhythm and normal heart sounds.  Exam reveals no gallop and no friction rub.   No murmur heard. Pulmonary/Chest: Breath sounds normal. Tachypnea noted. No respiratory distress. She has no wheezes. She has no rhonchi. She has no rales. She exhibits no tenderness and no crepitus.  Abdominal: Soft. Normal appearance and bowel sounds are normal. She exhibits no distension. There is no tenderness. There is no rebound and no guarding.  Musculoskeletal: Normal range of motion. She exhibits no edema and no tenderness.  Moves all extremities well. S/p left BKA  Neurological: She is alert and oriented to person, place, and time. She has normal  strength. No cranial nerve deficit.  Skin: Skin is warm, dry and intact. No rash noted. No erythema. No pallor.  Psychiatric: Her mood appears anxious. She is agitated.  Pt talking to herself    ED Course  Procedures (including critical care time)  Medications  0.9 %  sodium chloride infusion (not administered)    Followed by  0.9 %  sodium chloride infusion (1,000 mLs Intravenous New Bag/Given 09/30/12 1931)  Followed by  0.9 %  sodium chloride infusion (1,000 mLs Intravenous New Bag/Given 09/30/12 2236)  insulin regular (NOVOLIN R,HUMULIN R) 1 Units/mL in sodium chloride 0.9 % 100 mL infusion (5.4 Units/hr Intravenous New Bag/Given 09/30/12 2021)  sodium bicarbonate 1 mEq/mL injection (not administered)  ondansetron (ZOFRAN) injection 4 mg (4 mg Intravenous Given 09/30/12 1930)   Pt started on a insulin drip. She was given 2 liters of IV fluids for her dehdyration/tachycardia.   Patient given low-dose Ativan to see if that will help with some of her hallucinations. It is not recommended to cold [place] benzodiazepine use.  Labs Review Results for orders placed during the hospital encounter of 09/30/12  GLUCOSE, CAPILLARY      Result Value Range   Glucose-Capillary >600 (*) 70 - 99 mg/dL  CBC WITH DIFFERENTIAL      Result Value Range   WBC 17.4 (*) 4.0 - 10.5 K/uL   RBC 4.02  3.87 - 5.11 MIL/uL   Hemoglobin 11.9 (*) 12.0 - 15.0 g/dL   HCT 38.9  36.0 - 46.0 %   MCV 96.8  78.0 - 100.0 fL   MCH 29.6  26.0 - 34.0 pg   MCHC 30.6  30.0 - 36.0 g/dL   RDW 15.4  11.5 - 15.5 %   Platelets 510 (*) 150 - 400 K/uL   Neutrophils Relative % 90 (*) 43 - 77 %   Lymphocytes Relative 5 (*) 12 - 46 %   Monocytes Relative 5  3 - 12 %   Eosinophils Relative 0  0 - 5 %   Basophils Relative 0  0 - 1 %   Band Neutrophils 0  0 - 10 %   Metamyelocytes Relative 0     Myelocytes 0     Promyelocytes Absolute 0     Blasts 0     nRBC 0  0 /100 WBC   Neutro Abs 15.6 (*) 1.7 - 7.7 K/uL   Lymphs Abs  0.9  0.7 - 4.0 K/uL   Monocytes Absolute 0.9  0.1 - 1.0 K/uL   Eosinophils Absolute 0.0  0.0 - 0.7 K/uL   Basophils Absolute 0.0  0.0 - 0.1 K/uL  COMPREHENSIVE METABOLIC PANEL      Result Value Range   Sodium 132 (*) 135 - 145 mEq/L   Potassium 5.2 (*) 3.5 - 5.1 mEq/L   Chloride 95 (*) 96 - 112 mEq/L   CO2 5 (*) 19 - 32 mEq/L   Glucose, Bld 792 (*) 70 - 99 mg/dL   BUN 25 (*) 6 - 23 mg/dL   Creatinine, Ser 1.23 (*) 0.50 - 1.10 mg/dL   Calcium 8.7  8.4 - 10.5 mg/dL   Total Protein 6.3  6.0 - 8.3 g/dL   Albumin 2.4 (*) 3.5 - 5.2 g/dL   AST 12  0 - 37 U/L   ALT 10  0 - 35 U/L   Alkaline Phosphatase 92  39 - 117 U/L   Total Bilirubin 0.2 (*) 0.3 - 1.2 mg/dL   GFR calc non Af Amer 53 (*) >90 mL/min   GFR calc Af Amer 61 (*) >90 mL/min  ETHANOL      Result Value Range   Alcohol, Ethyl (B) <11  0 - 11 mg/dL  URINALYSIS, ROUTINE W REFLEX MICROSCOPIC      Result Value Range   Color, Urine YELLOW  YELLOW   APPearance CLOUDY (*) CLEAR   Specific Gravity, Urine 1.025  1.005 - 1.030  pH 6.0  5.0 - 8.0   Glucose, UA 500 (*) NEGATIVE mg/dL   Hgb urine dipstick LARGE (*) NEGATIVE   Bilirubin Urine NEGATIVE  NEGATIVE   Ketones, ur >80 (*) NEGATIVE mg/dL   Protein, ur 100 (*) NEGATIVE mg/dL   Urobilinogen, UA 0.2  0.0 - 1.0 mg/dL   Nitrite NEGATIVE  NEGATIVE   Leukocytes, UA NEGATIVE  NEGATIVE  URINE RAPID DRUG SCREEN (HOSP PERFORMED)      Result Value Range   Opiates NONE DETECTED  NONE DETECTED   Cocaine NONE DETECTED  NONE DETECTED   Benzodiazepines POSITIVE (*) NONE DETECTED   Amphetamines NONE DETECTED  NONE DETECTED   Tetrahydrocannabinol NONE DETECTED  NONE DETECTED   Barbiturates NONE DETECTED  NONE DETECTED  AMMONIA      Result Value Range   Ammonia 49  11 - 60 umol/L  TROPONIN I      Result Value Range   Troponin I 0.33 (*) <0.30 ng/mL  URINE MICROSCOPIC-ADD ON      Result Value Range   Squamous Epithelial / LPF MANY (*) RARE   WBC, UA 0-2  <3 WBC/hpf   Bacteria, UA  MANY (*) RARE  BLOOD GAS, ARTERIAL      Result Value Range   FIO2 0.21     O2 Content PENDING     pH, Arterial 6.995 (*) 7.350 - 7.450   pCO2 arterial 13.0 (*) 35.0 - 45.0 mmHg   pO2, Arterial 97.9  80.0 - 100.0 mmHg   Bicarbonate 3.0 (*) 20.0 - 24.0 mEq/L   TCO2 PENDING  0 - 100 mmol/L   Acid-base deficit 26.4 (*) 0.0 - 2.0 mmol/L   O2 Saturation 94.6     Patient temperature 37.0     Collection site RIGHT RADIAL     Drawn by [phone]     Sample type ARTERIAL DRAW     Allens test (pass/fail) PASS  PASS  PROTIME-INR      Result Value Range   Prothrombin Time 15.2  11.6 - 15.2 seconds   INR 1.23  0.00 - 1.49  APTT      Result Value Range   aPTT 25  24 - 37 seconds  GLUCOSE, CAPILLARY      Result Value Range   Glucose-Capillary 583 (*) 70 - 99 mg/dL   Comment 1 Documented in Chart    GLUCOSE, CAPILLARY      Result Value Range   Glucose-Capillary 497 (*) 70 - 99 mg/dL   Laboratory interpretation all normal except severe metabolic acidosis with compensatory respiratory alkalosis, leukocytosis,  Renal insuffic, hyponatremia, hyperkalemia (will correct as her acidosis is corrected), + troponin    Imaging Review Ct Head Wo Contrast  09/30/2012   *RADIOLOGY REPORT*  Clinical Data: Altered mental status  CT HEAD WITHOUT CONTRAST  Technique:  Contiguous axial images were obtained from the base of the skull through the vertex without contrast.  Comparison: Prior MRI from 04/12/2011  Findings: Multiple scattered focal hypodense areas are seen within the parasagittal right frontal lobe, right caudate nucleus, and posterior right frontal lobe, likely not changed as compared to the prior MRI and CT.  Findings are consistent with remote ischemic infarcts.  No definite new ischemic infarct identified.  There is no acute intracranial hemorrhage. No midline shift or mass lesion.  The CSF containing spaces are within normal limits.  There is no hydrocephalous.  No extra-axial fluid collection.   Calvarium is intact.  Orbital soft tissues are normal.  Paranasal sinuses and mastoid air cells are clear.  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  No definite new ischemic infarct or other acute intracranial process identified.   Original Report Authenticated By: Jeannine Boga, M.D.   Dg Chest Portable 1 View  09/30/2012   CLINICAL DATA:  Chest pain, altered mental status  EXAM: PORTABLE CHEST - 1 VIEW  COMPARISON:  None.  FINDINGS: The heart size and mediastinal contours are within normal limits. No acute infiltrate or pulmonary edema. The visualized skeletal structures are unremarkable.  IMPRESSION: No active disease.   Electronically Signed   By: Lahoma Crocker   On: 09/30/2012 19:53     Date: 09/30/2012  Rate: 138  Rhythm: sinus tachycardia  QRS Axis: normal  Intervals: normal  ST/T Wave abnormalities: normal  Conduction Disutrbances:none  Narrative Interpretation: low voltage, LAFB  Old EKG Reviewed: changes noted from 03/31/2011 HR was 04    MDM  patient presents with hallucinations all week despite her PCP stopping her medications ( Lyrica, narcotics, and benzodiazepine) she has had vomiting for 2 days and presents today with DKA with severe metabolic acidosis. She has a hyperkalemia which should resolve once her acidosis is corrected. She also is noted to have tachycardia with heart rates in the 140s consistent with severe dehydration or possibly even some benzodiazepine withdrawal. Her positive troponin is consistent with her tachycardia.    1. DKA (diabetic ketoacidoses)   2. Confusion     Plan admission   Rolland Porter, MD, FACEP    CRITICAL CARE Performed by: Rolland Porter L Total critical care time: 38 min Critical care time was exclusive of separately billable procedures and treating other patients. Critical care was necessary to treat or prevent imminent or life-threatening deterioration. Critical care was time spent personally by me on the  following activities: development of treatment plan with patient and/or surrogate as well as nursing, discussions with consultants, evaluation of patient's response to treatment, examination of patient, obtaining history from patient or surrogate, ordering and performing treatments and interventions, ordering and review of laboratory studies, ordering and review of radiographic studies, pulse oximetry and re-evaluation of patient's condition.     Janice Norrie, MD 09/30/12 2251

## 2012-09-30 NOTE — ED Notes (Signed)
LAB CALLED TO REPORT CRITICAL TROPONIN  0.33 DR. I KNAPP NOTIFIED

## 2012-09-30 NOTE — ED Notes (Signed)
Pt from [place]. Per EMS Pt has been vomiting x 2 days. Altered mental status x 2 days. Per EMS CBG registered HIGH.

## 2012-09-30 NOTE — H&P (Addendum)
[facility] History and Physical  [name] [id number] DOB: 1970/07/19 DOA: 09/30/2012   PCP: Alonza Bogus, MD   Chief Complaint:  Confusion and nausea, vomiting, and chest pain  HPI: [name] is a 42 y.o. female with a past medical history of diabetes, peripheral vascular disease, who is on anticoagulation for history of embolic strokes and arterial thrombosis. Patient is very confused at this time, and unable to provide any history. She's accompanied by her brother and the nursing home director, where she resides. Apparently, the last few days patient has been having hallucinations. She's had multiple episodes of nausea and vomiting without any blood in the emesis. She apparently had a few episodes of rectal bleeding. She had elevated PT/INR, for which she was given vitamin K a few days ago, and was started on ciprofloxacin for possible UTI recently. She has received Zofran and Phenergan in the nursing facility with only partial relief.  It was reported by the director of the SNF that the patient inadvertently received a bolus of D5 normal saline a couple days ago. Today she was complaining of chest pain, and appeared to be more confused and so, she was sent over to the emergency department for further evaluation. Currently no history is available from the patient.  Home Medications: Is not verified  Allergies:  Allergies  Allergen Reactions  . Aspirin Other (See Comments)    Cannot take this medication due to kidney disease  . Ibuprofen Other (See Comments)    Kidney disease  . Losartan Swelling    Past Medical History: Past Medical History  Diagnosis Date  . Shingles     11/2010  . Cellulitis     of face - 12/2010  . Peripheral vascular disease in diabetes mellitus   . Hyperlipidemia   . Tobacco abuse     0.5 - 1ppd x 21 yrs  . Headache(784.0)   . Renal disorder     glomerulonephritis  . Blood transfusion   . Anxiety   . Depression   . Peripheral  vascular disease   . Diabetes mellitus     IDDM Since Age 7  . Embolism - blood clot   . Renal insufficiency     Past Surgical History  Procedure Laterality Date  . Wrist surgery      left  . Embolectomy  01/29/2011    Procedure: EMBOLECTOMY;  Surgeon: Mal Misty, MD;  Location: [facility] OR;  Service: Vascular;  Laterality: Left;  Left Popliteal and Tibial Embolectomy with patch angioplasty  . Dilation and curettage of uterus    . Multiple tooth extractions    . Amputation  03/03/2011    Procedure: AMPUTATION DIGIT;  Surgeon: Newt Minion, MD;  Location: [place];  Service: Orthopedics;  Laterality: Left;  Left Foot Amputation 4th and 5th toes at MTP joint  . Tee without cardioversion  04/15/2011    Procedure: TRANSESOPHAGEAL ECHOCARDIOGRAM (TEE);  Surgeon: [name], MD;  Location: [facility];  Service: Cardiovascular;  Laterality: N/A;  . Amputation  04/22/2011    Procedure: AMPUTATION BELOW KNEE;  Surgeon: Newt Minion, MD;  Location: [place];  Service: Orthopedics;  Laterality: Left;  Left Below Knee Amputation    Social History:  reports that she has been passively smoking Cigarettes.  She has a 21 pack-year smoking history. She has never used smokeless tobacco. She reports that she uses illicit drugs (Marijuana) about once per week. She reports that she does not drink  alcohol.  Living Situation: She lives in a skilled nursing facility Activity Level: Activity level is unknown   Family History:  Family History  Problem Relation Age of Onset  . COPD Mother     alive - 67  . Hypertension Mother   . Diabetes Mother   . Stroke Mother   . Coronary artery disease Mother   . Diabetes Father     alive - 76  . Hypertension Father   . Coronary artery disease Father   . Anesthesia problems Neg Hx      Review of Systems - unable to obtain due to altered mental status  Physical Examination  Filed Vitals:   09/30/12 1901 09/30/12 2000 09/30/12 2015 09/30/12 2146  BP:  126/75   147/70  Pulse: 139   147  Temp:      TempSrc:      Resp:  23 16 18   SpO2:    99%    General appearance: alert, appears older than stated age, combative, delirious, distracted and no distress Head: Normocephalic, without obvious abnormality, atraumatic Eyes: conjunctivae/corneas clear. PERRL, EOM's intact.  Throat: Very dry. Mucous membranes Neck: no adenopathy, no carotid bruit, [name], supple, symmetrical, trachea midline and thyroid not enlarged, symmetric, no tenderness/mass/nodules Resp: Decreased air entry at the bases, but no crackles or wheezing. Cardio: S1-S2 is tachycardic. Regular. No S3, S4. No rubs, murmurs, or bruits. No pedal edema GI: soft, non-tender; bowel sounds normal; no masses,  no organomegaly Extremities: Status post left BKA Pulses: 2+ and symmetric Skin: Skin color, texture, turgor normal. No rashes or lesions Neurologic: He is alert combative. Moving all extremities. No obvious deficits are noted.   Laboratory Data: Results for orders placed during the hospital encounter of 09/30/12 (from the past 48 hour(s))  GLUCOSE, CAPILLARY     Status: Abnormal   Collection Time    09/30/12  7:16 PM      Result Value Range   Glucose-Capillary >600 (*) 70 - 99 mg/dL  URINALYSIS, ROUTINE W REFLEX MICROSCOPIC     Status: Abnormal   Collection Time    09/30/12  7:59 PM      Result Value Range   Color, Urine YELLOW  YELLOW   APPearance CLOUDY (*) CLEAR   Specific Gravity, Urine 1.025  1.005 - 1.030   pH 6.0  5.0 - 8.0   Glucose, UA 500 (*) NEGATIVE mg/dL   Hgb urine dipstick LARGE (*) NEGATIVE   Bilirubin Urine NEGATIVE  NEGATIVE   Ketones, ur >80 (*) NEGATIVE mg/dL   Protein, ur 100 (*) NEGATIVE mg/dL   Urobilinogen, UA 0.2  0.0 - 1.0 mg/dL   Nitrite NEGATIVE  NEGATIVE   Leukocytes, UA NEGATIVE  NEGATIVE  URINE RAPID DRUG SCREEN (HOSP PERFORMED)     Status: Abnormal   Collection Time    09/30/12  7:59 PM      Result Value Range   Opiates NONE DETECTED  NONE  DETECTED   Cocaine NONE DETECTED  NONE DETECTED   Benzodiazepines POSITIVE (*) NONE DETECTED   Amphetamines NONE DETECTED  NONE DETECTED   Tetrahydrocannabinol NONE DETECTED  NONE DETECTED   Barbiturates NONE DETECTED  NONE DETECTED   Comment:            DRUG SCREEN FOR MEDICAL PURPOSES     ONLY.  IF CONFIRMATION IS NEEDED     FOR ANY PURPOSE, NOTIFY LAB     WITHIN 5 DAYS.  LOWEST DETECTABLE LIMITS     FOR URINE DRUG SCREEN     Drug Class       Cutoff (ng/mL)     Amphetamine      1000     Barbiturate      200     Benzodiazepine   [id number]     Tricyclics       [id number]     Opiates          300     Cocaine          300     THC              50  URINE MICROSCOPIC-ADD ON     Status: Abnormal   Collection Time    09/30/12  7:59 PM      Result Value Range   Squamous Epithelial / LPF MANY (*) RARE   WBC, UA 0-2  <3 WBC/hpf   Bacteria, UA MANY (*) RARE  CBC WITH DIFFERENTIAL     Status: Abnormal   Collection Time    09/30/12  8:00 PM      Result Value Range   WBC 17.4 (*) 4.0 - 10.5 K/uL   RBC 4.02  3.87 - 5.11 MIL/uL   Hemoglobin 11.9 (*) 12.0 - 15.0 g/dL   HCT 38.9  36.0 - 46.0 %   MCV 96.8  78.0 - 100.0 fL   MCH 29.6  26.0 - 34.0 pg   MCHC 30.6  30.0 - 36.0 g/dL   RDW 15.4  11.5 - 15.5 %   Platelets 510 (*) 150 - 400 K/uL   Neutrophils Relative % 90 (*) 43 - 77 %   Lymphocytes Relative 5 (*) 12 - 46 %   Monocytes Relative 5  3 - 12 %   Eosinophils Relative 0  0 - 5 %   Basophils Relative 0  0 - 1 %   Band Neutrophils 0  0 - 10 %   Metamyelocytes Relative 0     Myelocytes 0     Promyelocytes Absolute 0     Blasts 0     nRBC 0  0 /100 WBC   Neutro Abs 15.6 (*) 1.7 - 7.7 K/uL   Lymphs Abs 0.9  0.7 - 4.0 K/uL   Monocytes Absolute 0.9  0.1 - 1.0 K/uL   Eosinophils Absolute 0.0  0.0 - 0.7 K/uL   Basophils Absolute 0.0  0.0 - 0.1 K/uL  COMPREHENSIVE METABOLIC PANEL     Status: Abnormal   Collection Time    09/30/12  8:00 PM      Result Value Range   Sodium 132 (*)  135 - 145 mEq/L   Potassium 5.2 (*) 3.5 - 5.1 mEq/L   Chloride 95 (*) 96 - 112 mEq/L   CO2 5 (*) 19 - 32 mEq/L   Comment: RESULT REPEATED AND VERIFIED     CRITICAL RESULT CALLED TO, READ BACK BY AND VERIFIED WITH:      RICE,V @ 2039 ON 09/30/12 BY WOODIE,J   Glucose, Bld 792 (*) 70 - 99 mg/dL   Comment: RESULT REPEATED AND VERIFIED     CRITICAL RESULT CALLED TO, READ BACK BY AND VERIFIED WITH:      RICE,V @ 2039 ON 09/30/12 BY WOODIE,J   BUN 25 (*) 6 - 23 mg/dL   Creatinine, Ser 1.23 (*) 0.50 - 1.10 mg/dL   Calcium 8.7  8.4 - 10.5 mg/dL   Total Protein 6.3  6.0 - 8.3 g/dL   Albumin 2.4 (*) 3.5 - 5.2 g/dL   AST 12  0 - 37 U/L   ALT 10  0 - 35 U/L   Alkaline Phosphatase 92  39 - 117 U/L   Total Bilirubin 0.2 (*) 0.3 - 1.2 mg/dL   GFR calc non Af Amer 53 (*) >90 mL/min   GFR calc Af Amer 61 (*) >90 mL/min   Comment: (NOTE)     The eGFR has been calculated using the CKD EPI equation.     This calculation has not been validated in all clinical situations.     eGFR's persistently <90 mL/min signify possible Chronic Kidney     Disease.  ETHANOL     Status: None   Collection Time    09/30/12  8:00 PM      Result Value Range   Alcohol, Ethyl (B) <11  0 - 11 mg/dL   Comment:            LOWEST DETECTABLE LIMIT FOR     SERUM ALCOHOL IS 11 mg/dL     FOR MEDICAL PURPOSES ONLY  TROPONIN I     Status: Abnormal   Collection Time    09/30/12  8:00 PM      Result Value Range   Troponin I 0.33 (*) <0.30 ng/mL   Comment:            Due to the release kinetics of cTnI,     a negative result within the first hours     of the onset of symptoms does not rule out     myocardial infarction with certainty.     If myocardial infarction is still suspected,     repeat the test at appropriate intervals.     RESULT REPEATED AND VERIFIED     CRITICAL RESULT CALLED TO, READ BACK BY AND VERIFIED WITH:      RICE,V @ 2049 ON 09/30/12 BY WOODIE,J  AMMONIA     Status: None   Collection Time    09/30/12   8:01 PM      Result Value Range   Ammonia 49  11 - 60 umol/L  PROTIME-INR     Status: None   Collection Time    09/30/12  9:19 PM      Result Value Range   Prothrombin Time 15.2  11.6 - 15.2 seconds   INR 1.23  0.00 - 1.49  APTT     Status: None   Collection Time    09/30/12  9:19 PM      Result Value Range   aPTT 25  24 - 37 seconds  GLUCOSE, CAPILLARY     Status: Abnormal   Collection Time    09/30/12  9:30 PM      Result Value Range   Glucose-Capillary 583 (*) 70 - 99 mg/dL   Comment 1 Documented in Chart    BLOOD GAS, ARTERIAL     Status: Abnormal (Preliminary result)   Collection Time    09/30/12  9:33 PM      Result Value Range   FIO2 0.21     O2 Content PENDING     pH, Arterial 6.995 (*) 7.350 - 7.450   Comment: CRITICAL RESULT CALLED TO, READ BACK BY AND VERIFIED WITH:     JENNIFER HEADLEY,RN BY BRIDGET STOPHEL,RRT,RCP ON 09/30/12 AT 2140   pCO2 arterial 13.0 (*) 35.0 - 45.0 mmHg   Comment: CRITICAL RESULT CALLED TO, READ BACK BY  AND VERIFIED WITH:     JENNIFER HEADLEY,RN BY BRIDGET STOPHEL,RRT,RCP ON 09/30/12 AT 2140   pO2, Arterial 97.9  80.0 - 100.0 mmHg   Bicarbonate 3.0 (*) 20.0 - 24.0 mEq/L   TCO2 PENDING  0 - 100 mmol/L   Acid-base deficit 26.4 (*) 0.0 - 2.0 mmol/L   O2 Saturation 94.6     Patient temperature 37.0     Collection site RIGHT RADIAL     Drawn by [phone]     Sample type ARTERIAL DRAW     Allens test (pass/fail) PASS  PASS  GLUCOSE, CAPILLARY     Status: Abnormal   Collection Time    09/30/12 10:31 PM      Result Value Range   Glucose-Capillary 497 (*) 70 - 99 mg/dL    Radiology Reports: Ct Head Wo Contrast  09/30/2012   *RADIOLOGY REPORT*  Clinical Data: Altered mental status  CT HEAD WITHOUT CONTRAST  Technique:  Contiguous axial images were obtained from the base of the skull through the vertex without contrast.  Comparison: Prior MRI from 04/12/2011  Findings: Multiple scattered focal hypodense areas are seen within the parasagittal right  frontal lobe, right caudate nucleus, and posterior right frontal lobe, likely not changed as compared to the prior MRI and CT.  Findings are consistent with remote ischemic infarcts.  No definite new ischemic infarct identified.  There is no acute intracranial hemorrhage. No midline shift or mass lesion.  The CSF containing spaces are within normal limits.  There is no hydrocephalous.  No extra-axial fluid collection.  Calvarium is intact.  Orbital soft tissues are normal.  Paranasal sinuses and mastoid air cells are clear.  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  No definite new ischemic infarct or other acute intracranial process identified.   Original Report Authenticated By: Jeannine Boga, M.D.   Dg Chest Portable 1 View  09/30/2012   CLINICAL DATA:  Chest pain, altered mental status  EXAM: PORTABLE CHEST - 1 VIEW  COMPARISON:  None.  FINDINGS: The heart size and mediastinal contours are within normal limits. No acute infiltrate or pulmonary edema. The visualized skeletal structures are unremarkable.  IMPRESSION: No active disease.   Electronically Signed   By: Lahoma Crocker   On: 09/30/2012 19:53    Electrocardiogram: EKG shows sinus tachycardia at 138 beats per minute. Left axis deviation is noted. No obvious Q waves. No concerning ST chart T-wave changes are noted.  Problem List  Principal Problem:   DKA (diabetic ketoacidoses) Active Problems:   Diabetes mellitus   Peripheral vascular disease in diabetes mellitus   Thromboembolism of lower extremity artery   Acute encephalopathy   Assessment: This is a 42 year old, Caucasian female who has severe diabetic ketoacidosis. Her pH is less than 7. She surprisingly is ventilating well, although she is very confused. Her INR is normal today. It was greater than 10 a few days ago. She has mildly elevated troponin, which could be due to demand ischemia from tachycardia.  Plan: #1 severe diabetic ketoacidosis: She  has been placed on an insulin infusion, which will be continued. Due to her severe acidosis she'll be given 2 amps of sodium bicarbonate. ABG will be repeated tonight. Bmets will be monitored closely. HbA1c will be checked. Even though she has a significantly abnormal pH sent she is ventilating well. There is no immediate need for airway protection. If her mental status deteriorates, and she does not make improvement as anticipated she may have to be  intubated. She'll be given IV fluids  #2 acute encephalopathy: Most likely due to the severe DKA. CT head is been done and is pending. She does not have any neurological deficits at this time. UA is abnormal, however, does not suggest infection. No role for antibiotics at this time.  #3 elevated troponin: This will be repeated. EKG does not show any acute ischemic changes.  #4 history of embolic strokes and arterial thrombosis status post thrombectomy: She was on warfarin. She received doses of vitamin K few days ago, for an INR that was greater than 10. INR is subtherapeutic at this time. We will defer initiation of anticoagulation to her primary care physician, who will see her in the morning.  #5 history of peripheral vascular disease, status post amputation of lower extremity. This appears to be stable. Continue to monitor .  #6 questionable history of rectal bleeding:  No active bleeding noted currently. It may have been related to the elevated INR. Continue to monitor closely. CBCs will be checked closely. FOBT.   DVT Prophylaxis: We will place her on SQ Heparin for now. Full anticoagulation deferred to providers at [facility].  Code Status: Full code  Family Communication: Discussed with her brother   Disposition Plan: PLEASE SEE BELOW  Further management decisions will depend on results of further testing and patient's response to treatment.   [facility]  [facility] Pager [phone]  If 7PM-7AM, please contact  night-coverage [email or web address] Password [facility]  09/30/2012, 10:49 PM   Repeat troponin was 7. Patient continues to be confused but seems better. HR is better. Elevated troponin is likely due to demand ischemia but there are reports of chest pains. Since there is no cardiology here over the weekend patient will be best served by being transferred to [facility]. Discussed with Dr. Alva Garnet and he agrees. He accepts in transfer to ICU. Informed patient's brother. Will give aspirin. Hold off on full IV Heparin for now per Dr. Alva Garnet.  Kairyn Olmeda 12:00 AM

## 2012-10-01 ENCOUNTER — Encounter (HOSPITAL_COMMUNITY)
Admission: EM | Disposition: A | Discharge status: Home / Self Care | Payer: Self-pay | Source: Home / Self Care | Attending: Pulmonary Disease

## 2012-10-01 ENCOUNTER — Ambulatory Visit (HOSPITAL_COMMUNITY): Admit: 2012-10-01 | Payer: Self-pay | Admitting: Cardiology

## 2012-10-01 DIAGNOSIS — E1110 Type 2 diabetes mellitus with ketoacidosis without coma: Secondary | ICD-10-CM

## 2012-10-01 DIAGNOSIS — I517 Cardiomegaly: Secondary | ICD-10-CM

## 2012-10-01 DIAGNOSIS — I2109 ST elevation (STEMI) myocardial infarction involving other coronary artery of anterior wall: Secondary | ICD-10-CM

## 2012-10-01 DIAGNOSIS — I2510 Atherosclerotic heart disease of native coronary artery without angina pectoris: Secondary | ICD-10-CM

## 2012-10-01 DIAGNOSIS — I219 Acute myocardial infarction, unspecified: Secondary | ICD-10-CM

## 2012-10-01 DIAGNOSIS — Z9229 Personal history of other drug therapy: Secondary | ICD-10-CM

## 2012-10-01 HISTORY — PX: LEFT HEART CATHETERIZATION WITH CORONARY ANGIOGRAM: SHX5451

## 2012-10-01 LAB — COMPREHENSIVE METABOLIC PANEL
ALT: 18 U/L (ref 0–35)
AST: 68 U/L — ABNORMAL HIGH (ref 0–37)
Albumin: 2.1 g/dL — ABNORMAL LOW (ref 3.5–5.2)
Alkaline Phosphatase: 79 U/L (ref 39–117)
BUN: 19 mg/dL (ref 6–23)
CO2: 17 mEq/L — ABNORMAL LOW (ref 19–32)
Calcium: 8.3 mg/dL — ABNORMAL LOW (ref 8.4–10.5)
Chloride: 109 mEq/L (ref 96–112)
Creatinine, Ser: 1 mg/dL (ref 0.50–1.10)
GFR calc Af Amer: 79 mL/min — ABNORMAL LOW (ref >90)
GFR calc non Af Amer: 68 mL/min — ABNORMAL LOW (ref >90)
Glucose, Bld: 159 mg/dL — ABNORMAL HIGH (ref 70–99)
Potassium: 3.6 mEq/L (ref 3.5–5.1)
Sodium: 144 mEq/L (ref 135–145)
Total Bilirubin: 0.1 mg/dL — ABNORMAL LOW (ref 0.3–1.2)
Total Protein: 5.8 g/dL — ABNORMAL LOW (ref 6.0–8.3)

## 2012-10-01 LAB — CBC
HCT: 35.2 % — ABNORMAL LOW (ref 36.0–46.0)
Hemoglobin: 11.7 g/dL — ABNORMAL LOW (ref 12.0–15.0)
MCH: 29.1 pg (ref 26.0–34.0)
MCHC: 33.2 g/dL (ref 30.0–36.0)
MCV: 87.6 fL (ref 78.0–100.0)
Platelets: 431 10*3/uL — ABNORMAL HIGH (ref 150–400)
RBC: 4.02 MIL/uL (ref 3.87–5.11)
RDW: 14.7 % (ref 11.5–15.5)
WBC: 11 10*3/uL — ABNORMAL HIGH (ref 4.0–10.5)

## 2012-10-01 LAB — BASIC METABOLIC PANEL
BUN: 15 mg/dL (ref 6–23)
BUN: 12 mg/dL (ref 6–23)
CO2: 20 mEq/L (ref 19–32)
CO2: 22 mEq/L (ref 19–32)
Calcium: 8 mg/dL — ABNORMAL LOW (ref 8.4–10.5)
Calcium: 7.7 mg/dL — ABNORMAL LOW (ref 8.4–10.5)
Chloride: 109 mEq/L (ref 96–112)
Chloride: 105 mEq/L (ref 96–112)
Creatinine, Ser: 0.77 mg/dL (ref 0.50–1.10)
Creatinine, Ser: 0.8 mg/dL (ref 0.50–1.10)
GFR calc Af Amer: >90 mL/min (ref >90)
GFR calc Af Amer: >90 mL/min (ref >90)
GFR calc non Af Amer: >90 mL/min (ref >90)
GFR calc non Af Amer: 89 mL/min — ABNORMAL LOW (ref >90)
Glucose, Bld: 174 mg/dL — ABNORMAL HIGH (ref 70–99)
Glucose, Bld: 141 mg/dL — ABNORMAL HIGH (ref 70–99)
Potassium: 3.6 mEq/L (ref 3.5–5.1)
Potassium: 3.1 mEq/L — ABNORMAL LOW (ref 3.5–5.1)
Sodium: 142 mEq/L (ref 135–145)
Sodium: 137 mEq/L (ref 135–145)

## 2012-10-01 LAB — MAGNESIUM: Magnesium: 1.6 mg/dL (ref 1.5–2.5)

## 2012-10-01 LAB — TROPONIN I
Troponin I: >20 ng/mL (ref <0.30)
Troponin I: 16.23 ng/mL (ref <0.30)

## 2012-10-01 LAB — GLUCOSE, CAPILLARY
Glucose-Capillary: 127 mg/dL — ABNORMAL HIGH (ref 70–99)
Glucose-Capillary: 128 mg/dL — ABNORMAL HIGH (ref 70–99)
Glucose-Capillary: 129 mg/dL — ABNORMAL HIGH (ref 70–99)
Glucose-Capillary: 152 mg/dL — ABNORMAL HIGH (ref 70–99)
Glucose-Capillary: 152 mg/dL — ABNORMAL HIGH (ref 70–99)
Glucose-Capillary: 153 mg/dL — ABNORMAL HIGH (ref 70–99)
Glucose-Capillary: 157 mg/dL — ABNORMAL HIGH (ref 70–99)
Glucose-Capillary: 160 mg/dL — ABNORMAL HIGH (ref 70–99)
Glucose-Capillary: 169 mg/dL — ABNORMAL HIGH (ref 70–99)
Glucose-Capillary: 172 mg/dL — ABNORMAL HIGH (ref 70–99)
Glucose-Capillary: 178 mg/dL — ABNORMAL HIGH (ref 70–99)
Glucose-Capillary: 182 mg/dL — ABNORMAL HIGH (ref 70–99)
Glucose-Capillary: 188 mg/dL — ABNORMAL HIGH (ref 70–99)
Glucose-Capillary: 222 mg/dL — ABNORMAL HIGH (ref 70–99)
Glucose-Capillary: 245 mg/dL — ABNORMAL HIGH (ref 70–99)

## 2012-10-01 LAB — OCCULT BLOOD X 1 CARD TO LAB, STOOL: Fecal Occult Bld: NEGATIVE

## 2012-10-01 LAB — MRSA PCR SCREENING: MRSA by PCR: NEGATIVE

## 2012-10-01 LAB — PHOSPHORUS: Phosphorus: 1.3 mg/dL — ABNORMAL LOW (ref 2.3–4.6)

## 2012-10-01 SURGERY — LEFT HEART CATHETERIZATION WITH CORONARY ANGIOGRAM
Anesthesia: LOCAL

## 2012-10-01 MED ORDER — INSULIN ASPART 100 UNIT/ML ~~LOC~~ SOLN
0.0000 [IU] | Freq: Every day | SUBCUTANEOUS | Status: DC
  Administered 2012-10-01 – 2012-10-03 (×2): 2 [IU] via SUBCUTANEOUS

## 2012-10-01 MED ORDER — SODIUM CHLORIDE 0.9 % IV SOLN: 250.0000 mL | INTRAVENOUS | Status: DC | PRN

## 2012-10-01 MED ORDER — VERAPAMIL HCL 2.5 MG/ML IV SOLN
INTRAVENOUS | Status: AC
  Filled 2012-10-01: qty 2

## 2012-10-01 MED ORDER — ASPIRIN 300 MG RE SUPP
300.0000 mg | Freq: Once | RECTAL | Status: AC
  Administered 2012-10-01: 300 mg via RECTAL
  Filled 2012-10-01: qty 1

## 2012-10-01 MED ORDER — SODIUM CHLORIDE 0.9 % IJ SOLN
3.0000 mL | Freq: Two times a day (BID) | INTRAMUSCULAR | Status: DC
  Administered 2012-10-01 – 2012-10-03 (×5): 3 mL via INTRAVENOUS

## 2012-10-01 MED ORDER — MIDAZOLAM HCL 2 MG/2ML IJ SOLN
INTRAMUSCULAR | Status: AC
  Filled 2012-10-01: qty 2

## 2012-10-01 MED ORDER — ACETAMINOPHEN 325 MG PO TABS
650.0000 mg | ORAL_TABLET | Freq: Four times a day (QID) | ORAL | Status: DC | PRN
  Administered 2012-10-03: 650 mg via ORAL
  Filled 2012-10-01: qty 2

## 2012-10-01 MED ORDER — ASPIRIN 300 MG RE SUPP
RECTAL | Status: AC
  Filled 2012-10-01: qty 1

## 2012-10-01 MED ORDER — DEXTROSE 50 % IV SOLN: 25.0000 mL | INTRAVENOUS | Status: DC | PRN

## 2012-10-01 MED ORDER — ATORVASTATIN CALCIUM 80 MG PO TABS
80.0000 mg | ORAL_TABLET | Freq: Every day | ORAL | Status: DC
  Administered 2012-10-02 – 2012-10-03 (×2): 80 mg via ORAL
  Filled 2012-10-01 (×4): qty 1

## 2012-10-01 MED ORDER — ASPIRIN 81 MG PO CHEW: 324.0000 mg | CHEWABLE_TABLET | ORAL | Status: DC

## 2012-10-01 MED ORDER — HEPARIN SODIUM (PORCINE) 5000 UNIT/ML IJ SOLN
5000.0000 [IU] | Freq: Three times a day (TID) | INTRAMUSCULAR | Status: DC
  Administered 2012-10-01: 5000 [IU] via SUBCUTANEOUS
  Filled 2012-10-01 (×4): qty 1

## 2012-10-01 MED ORDER — TICAGRELOR 90 MG PO TABS
90.0000 mg | ORAL_TABLET | Freq: Two times a day (BID) | ORAL | Status: DC
  Administered 2012-10-01 – 2012-10-04 (×7): 90 mg via ORAL
  Filled 2012-10-01 (×8): qty 1

## 2012-10-01 MED ORDER — SODIUM CHLORIDE 0.9 % IV SOLN
1.0000 mL/kg/h | INTRAVENOUS | Status: AC
  Administered 2012-10-01: 58 mL via INTRAVENOUS

## 2012-10-01 MED ORDER — INSULIN GLARGINE 100 UNIT/ML ~~LOC~~ SOLN
20.0000 [IU] | Freq: Once | SUBCUTANEOUS | Status: AC
  Administered 2012-10-01: 20 [IU] via SUBCUTANEOUS
  Filled 2012-10-01: qty 0.2

## 2012-10-01 MED ORDER — SODIUM CHLORIDE 0.9 % IV SOLN: INTRAVENOUS | Status: DC

## 2012-10-01 MED ORDER — POTASSIUM CHLORIDE 10 MEQ/100ML IV SOLN
10.0000 meq | INTRAVENOUS | Status: AC
  Administered 2012-10-01 (×2): 10 meq via INTRAVENOUS
  Filled 2012-10-01: qty 100

## 2012-10-01 MED ORDER — MAGNESIUM SULFATE 40 MG/ML IJ SOLN
2.0000 g | Freq: Once | INTRAMUSCULAR | Status: AC
  Administered 2012-10-01: 2 g via INTRAVENOUS
  Filled 2012-10-01: qty 50

## 2012-10-01 MED ORDER — SODIUM CHLORIDE 0.9 % IJ SOLN: 3.0000 mL | INTRAMUSCULAR | Status: DC | PRN

## 2012-10-01 MED ORDER — HEPARIN SODIUM (PORCINE) 5000 UNIT/ML IJ SOLN
5000.0000 [IU] | Freq: Three times a day (TID) | INTRAMUSCULAR | Status: DC
  Administered 2012-10-01 – 2012-10-04 (×8): 5000 [IU] via SUBCUTANEOUS
  Filled 2012-10-01 (×11): qty 1

## 2012-10-01 MED ORDER — SODIUM CHLORIDE 0.9 % IV SOLN
INTRAVENOUS | Status: DC
  Filled 2012-10-01: qty 1

## 2012-10-01 MED ORDER — ASPIRIN 81 MG PO CHEW
81.0000 mg | CHEWABLE_TABLET | Freq: Every day | ORAL | Status: DC
  Administered 2012-10-02 – 2012-10-04 (×3): 81 mg via ORAL
  Filled 2012-10-01 (×4): qty 1

## 2012-10-01 MED ORDER — FENTANYL CITRATE 0.05 MG/ML IJ SOLN
25.0000 ug | INTRAMUSCULAR | Status: DC | PRN
  Administered 2012-10-02: 50 ug via INTRAVENOUS
  Administered 2012-10-02: 25 ug via INTRAVENOUS
  Administered 2012-10-02 – 2012-10-03 (×6): 75 ug via INTRAVENOUS
  Filled 2012-10-01 (×8): qty 2

## 2012-10-01 MED ORDER — FENTANYL CITRATE 0.05 MG/ML IJ SOLN
INTRAMUSCULAR | Status: AC
  Administered 2012-10-02: 75 ug via INTRAVENOUS
  Filled 2012-10-01: qty 2

## 2012-10-01 MED ORDER — SODIUM CHLORIDE 0.9 % IV SOLN
INTRAVENOUS | Status: DC
  Administered 2012-10-01 (×2): 0.9 [IU]/h via INTRAVENOUS
  Filled 2012-10-01: qty 1

## 2012-10-01 MED ORDER — INSULIN ASPART 100 UNIT/ML ~~LOC~~ SOLN
0.0000 [IU] | Freq: Three times a day (TID) | SUBCUTANEOUS | Status: DC
Start: 2012-10-02 — End: 2012-10-04
  Administered 2012-10-02: 2 [IU] via SUBCUTANEOUS
  Administered 2012-10-02: 11 [IU] via SUBCUTANEOUS
  Administered 2012-10-03 – 2012-10-04 (×3): 2 [IU] via SUBCUTANEOUS

## 2012-10-01 MED ORDER — SODIUM CHLORIDE 0.9 % IJ SOLN
3.0000 mL | Freq: Two times a day (BID) | INTRAMUSCULAR | Status: DC
  Administered 2012-10-01: 3 mL via INTRAVENOUS

## 2012-10-01 MED ORDER — SODIUM CHLORIDE 0.9 % IJ SOLN
3.0000 mL | Freq: Two times a day (BID) | INTRAMUSCULAR | Status: DC
  Administered 2012-10-01: 23:00:00 via INTRAVENOUS
  Administered 2012-10-02 – 2012-10-03 (×4): 3 mL via INTRAVENOUS

## 2012-10-01 MED ORDER — LIDOCAINE HCL (PF) 1 % IJ SOLN
INTRAMUSCULAR | Status: AC
  Filled 2012-10-01: qty 30

## 2012-10-01 MED ORDER — DEXTROSE-NACL 5-0.45 % IV SOLN
INTRAVENOUS | Status: DC
  Administered 2012-10-01: 02:00:00 via INTRAVENOUS

## 2012-10-01 MED ORDER — NITROGLYCERIN 0.2 MG/ML ON CALL CATH LAB
INTRAVENOUS | Status: AC
  Filled 2012-10-01: qty 1

## 2012-10-01 MED ORDER — METOPROLOL TARTRATE 25 MG PO TABS
25.0000 mg | ORAL_TABLET | Freq: Two times a day (BID) | ORAL | Status: DC
  Administered 2012-10-01 – 2012-10-04 (×6): 25 mg via ORAL
  Filled 2012-10-01 (×7): qty 1

## 2012-10-01 MED ORDER — POTASSIUM PHOSPHATE DIBASIC 3 MMOLE/ML IV SOLN
30.0000 mmol | Freq: Once | INTRAVENOUS | Status: AC
  Administered 2012-10-01: 30 mmol via INTRAVENOUS
  Filled 2012-10-01: qty 10

## 2012-10-01 MED ORDER — POTASSIUM CHLORIDE CRYS ER 20 MEQ PO TBCR
40.0000 meq | EXTENDED_RELEASE_TABLET | Freq: Once | ORAL | Status: AC
  Administered 2012-10-01: 40 meq via ORAL
  Filled 2012-10-01: qty 2

## 2012-10-01 MED ORDER — METOPROLOL TARTRATE 1 MG/ML IV SOLN
5.0000 mg | Freq: Four times a day (QID) | INTRAVENOUS | Status: DC
  Administered 2012-10-01: 5 mg via INTRAVENOUS
  Filled 2012-10-01: qty 5

## 2012-10-01 MED ORDER — SODIUM CHLORIDE 0.9 % IV SOLN: 1000.0000 mL | INTRAVENOUS | Status: DC

## 2012-10-01 MED ORDER — METOPROLOL TARTRATE 1 MG/ML IV SOLN
5.0000 mg | Freq: Once | INTRAVENOUS | Status: AC
  Administered 2012-10-01: 5 mg via INTRAVENOUS
  Filled 2012-10-01: qty 5

## 2012-10-01 MED ORDER — HEPARIN (PORCINE) IN NACL 2-0.9 UNIT/ML-% IJ SOLN
INTRAMUSCULAR | Status: AC
  Filled 2012-10-01: qty 1000

## 2012-10-01 MED ORDER — DEXTROSE-NACL 5-0.45 % IV SOLN
INTRAVENOUS | Status: DC
  Administered 2012-10-01 (×2): via INTRAVENOUS

## 2012-10-01 MED ORDER — INSULIN GLARGINE 100 UNIT/ML ~~LOC~~ SOLN
20.0000 [IU] | Freq: Every day | SUBCUTANEOUS | Status: DC
  Administered 2012-10-02: 20 [IU] via SUBCUTANEOUS
  Filled 2012-10-01 (×3): qty 0.2

## 2012-10-01 MED ORDER — HEPARIN (PORCINE) IN NACL 100-0.45 UNIT/ML-% IJ SOLN
600.0000 [IU]/h | INTRAMUSCULAR | Status: DC
  Administered 2012-10-01: 600 [IU]/h via INTRAVENOUS
  Filled 2012-10-01: qty 250

## 2012-10-01 MED ORDER — HEPARIN SODIUM (PORCINE) 1000 UNIT/ML IJ SOLN
INTRAMUSCULAR | Status: AC
  Filled 2012-10-01: qty 1

## 2012-10-01 NOTE — Progress Notes (Signed)
  Echocardiogram 2D Echocardiogram has been performed.  Basilia Jumbo 10/01/2012, 3:25 PM

## 2012-10-01 NOTE — Progress Notes (Signed)
EKG complete Dr. Leonidas Romberg notified.  Desmond Dike RN

## 2012-10-01 NOTE — Interval H&P Note (Signed)
History and Physical Interval Note:  10/01/2012 10:59 AM  [name]  has presented today for surgery, with the diagnosis of Unstable Angina  The various methods of treatment have been discussed with the patient and family. After consideration of risks, benefits and other options for treatment, the patient has consented to  Procedure(s): LEFT HEART CATHETERIZATION WITH CORONARY ANGIOGRAM (N/A) as a surgical intervention .  The patient's history has been reviewed, patient examined, no change in status, stable for surgery.  I have reviewed the patient's chart and labs.  Questions were answered to the patient's satisfaction.   Cath Lab Visit (complete for each Cath Lab visit)  Clinical Evaluation Leading to the Procedure:   ACS: yes  Non-ACS:    Anginal Classification: No Symptoms  Anti-ischemic medical therapy: Minimal Therapy (1 class of medications)  Non-Invasive Test Results: No non-invasive testing performed  Prior CABG: No previous CABG        Lisa Fillion [place] MD,FACC 10/01/2012 10:59 AM

## 2012-10-01 NOTE — Progress Notes (Signed)
eLink Physician-Brief Progress Note Patient Name: [name] DOB: 1970/11/13 MRN: [id number]  Date of Service  10/01/2012   HPI/Events of Note   DKA, gap closed, tolerates PO, mental status improved Hypokalemia, hypophosphatemia, hypomagnesemia    eICU Interventions   Lantus 20 x 1 now and daily starting 9/15 (home dose) SSI ac&hs Off Insulin and D5 1/2NS in 2 hours Advance diet Mg 2g x 1 KPhos 30 mmol x 1 KDur 40 x 1    Intervention Category Major Interventions: Electrolyte abnormality - evaluation and management;Other:  Doree Fudge 10/01/2012, 4:03 PM

## 2012-10-01 NOTE — Progress Notes (Addendum)
ANTICOAGULATION CONSULT NOTE - Initial Consult  Pharmacy Consult for heparin Indication: chest pain/ACS  Allergies  Allergen Reactions  . Aspirin Other (See Comments)    Cannot take this medication due to kidney disease  . Ibuprofen Other (See Comments)    Kidney disease  . Losartan Swelling    Patient Measurements: Heparin Dosing Weight: 50kg  Vital Signs: Temp: 99 F (37.2 C) (09/14 0300) Temp src: Oral (09/14 0300) BP: 144/62 mmHg (09/14 0600) Pulse Rate: 93 (09/14 0600)  Labs:  Recent Labs  09/30/12 2000 09/30/12 2119 09/30/12 2303 10/01/12 0350  HGB 11.9*  --   --  11.7*  HCT 38.9  --   --  35.2*  PLT 510*  --   --  431*  APTT  --  25  --   --   LABPROT  --  15.2  --   --   INR  --  1.23  --   --   CREATININE 1.23*  --  1.24* 1.00  TROPONINI 0.33*  --  7.24* >20.00*     Medical History: Past Medical History  Diagnosis Date  . Shingles     11/2010  . Cellulitis     of face - 12/2010  . Peripheral vascular disease in diabetes mellitus   . Hyperlipidemia   . Tobacco abuse     0.5 - 1ppd x 21 yrs  . Headache(784.0)   . Renal disorder     glomerulonephritis  . Blood transfusion   . Anxiety   . Depression   . Peripheral vascular disease   . Diabetes mellitus     IDDM Since Age 19  . Embolism - blood clot   . Renal insufficiency     Medications:  Prescriptions prior to admission  Medication Sig Dispense Refill  . ALPRAZolam (XANAX) 1 MG tablet Take 1 mg by mouth 3 (three) times daily as needed for anxiety.      . ciprofloxacin (CIPRO) 500 MG tablet Take 500 mg by mouth 2 (two) times daily. For 7 days starting 09/28/12      . fluconazole (DIFLUCAN) 150 MG tablet Take 150 mg by mouth daily. For 2 weeks started on 09/21/12      . insulin aspart (NOVOLOG FLEXPEN) 100 UNIT/ML injection Inject 2-10 Units into the skin 3 (three) times daily before meals. Sliding scale as follows 200-250=2 units 251-300=4 units 301-350=6 units 351-400=8 units >400-give  10 units and Notify MD      . Insulin Glargine (LANTUS SOLOSTAR) 100 UNIT/ML SOPN Inject 20 Units into the skin at bedtime.      Marland Kitchen omeprazole (PRILOSEC) 20 MG capsule Take 20 mg by mouth daily.      . ondansetron (ZOFRAN) 8 MG tablet Take 8 mg by mouth every 8 (eight) hours as needed for nausea.      Marland Kitchen oxyCODONE-acetaminophen (PERCOCET) 10-325 MG per tablet Take 1 tablet by mouth 4 (four) times daily.      Marland Kitchen PARoxetine (PAXIL) 20 MG tablet Take 20 mg by mouth daily.      . promethazine (PHENERGAN) 25 MG tablet Take 25 mg by mouth every 6 (six) hours as needed for nausea.      . promethazine (PHENERGAN) 25 MG/ML injection Inject 25 mg into the vein every 6 (six) hours as needed for nausea or vomiting.      . sennosides-docusate sodium (SENOKOT-S) 8.6-50 MG tablet Take 1 tablet by mouth 2 (two) times daily.      . sodium  chloride 0.45 % Inject 100 mLs into the vein 3 (three) times daily. Patient gets this every shift for dehydration(started on 09/27/12)      . warfarin (COUMADIN) 5 MG tablet Take 5 mg by mouth daily.       Scheduled:  . heparin  5,000 Units Subcutaneous Q8H  . sodium bicarbonate       Infusions:  . sodium chloride    . sodium chloride    . dextrose 5 % and 0.45% NaCl Stopped (10/01/12 0315)  . dextrose 5 % and 0.45% NaCl 125 mL/hr at 10/01/12 0315  . insulin (NOVOLIN-R) infusion 2.4 Units/hr (10/01/12 [id number])    Assessment: 42yo female presented to [facility] w/ vomiting and AMS, found to have mildly elevated troponin which rose to 7, therefore transferred to [facility], denied CP, now troponin >20, to begin heparin.  Goal of Therapy:  Heparin level 0.3-0.7 units/ml Monitor platelets by anticoagulation protocol: Yes   Plan:  Will start heparin gtt at 600 units/hr (without bolus given 5000 units of SQ heparin ~1hr ago) and monitor heparin levels and CBC.  Wynona Neat, PharmD, BCPS  10/01/2012,7:20 AM

## 2012-10-01 NOTE — Progress Notes (Signed)
eLink Physician-Brief Progress Note Patient Name: [name] DOB: [birth year] MRN: [id number]  Date of Service  10/01/2012   HPI/Events of Note  Patient c/o of upper right outer thigh pain.  No obvious skin changes or swelling.  Nurse requesting pain medication and is concerned about patients past history of DVT and h/o of left BKA.   eICU Interventions  Plan: PRN orders for tylenol and fentanyl for pain Recommendation bedside evaluation in AM to consider further imaging studies is concern for DVT.   Intervention Category Intermediate Interventions: Pain - evaluation and management  DETERDING,ELIZABETH 10/01/2012, 11:28 PM

## 2012-10-01 NOTE — Progress Notes (Signed)
CRITICAL VALUE ALERT  Critical value received:  Troponin >20   Date of notification:  10-01-12  Time of notification:  0530  Critical value read back:yes  Nurse who received alert:  Desmond Dike RN MD notified (1st page):  Dr. Leonidas Romberg  Time of first page:  0530  MD notified (2nd page):  Time of second page:  Responding MD:  Dr. Leonidas Romberg  Time MD responded: [phone]

## 2012-10-01 NOTE — Consult Note (Signed)
CARDIOLOGY CONSULT NOTE   Patient ID: MINERVIA BRILLA MRN: [id number] DOB/AGE: [birth year] 42 y.o.  Admit Date: 09/30/2012 Primary Physician: Alonza Bogus, MD Consulting Cardiologist:  [facility] cardiology team   Ms. Bota is a 42 y.o.female  The complex medical history. She has known significant diabetes. She presented with diabetic ketoacidosis. We have no significant history concerning whether she was having any chest pain before she was admitted. Cardiology is consulted because of abnormal EKGs and significant increase in troponin.  The patient had an embolus to her leg in 2013. She had an embolectomy. Despite this eventually she had gangrene of some of her toes. Eventually she required an amputation. Transesophageal echo was done in 2013. At that time there was no definite cardiac source of embolus. She had normal left ventricular function. However MRI has shown multiple embolic events to her head. On this basis Coumadin has been recommended. She is on Coumadin as an outpatient. Her INR on admission is 1.2.  EKGs from 2013 reveal no evidence of Q waves in the inferior leads. There is decreased anterior R wave progression in V1 to V3. EKGs this admission reveal large inferior Q waves. There is some anterior ST elevation in addition to the old decreased anterior R wave progression. Her troponin this admission has gone from slight elevation up to 20.   Allergies  Allergen Reactions  . Aspirin Other (See Comments)    Cannot take this medication due to kidney disease  . Ibuprofen Other (See Comments)    Kidney disease  . Losartan Swelling    Medications Scheduled Medications: . [START ON 10/02/2012] aspirin  324 mg Oral Pre-Cath  . metoprolol  5 mg Intravenous Q6H  . sodium bicarbonate      . sodium chloride  3 mL Intravenous Q12H     Infusions: . dextrose 5 % and 0.45% NaCl Stopped (10/01/12 0315)  . dextrose 5 % and 0.45% NaCl 125 mL/hr at 10/01/12 0315  . heparin 600  Units/hr (10/01/12 0800)  . insulin (NOVOLIN-R) infusion 1.1 Units/hr (10/01/12 0900)     PRN Medications:  sodium chloride, dextrose, sodium chloride   Past Medical History  Diagnosis Date  . Shingles     11/2010  . Cellulitis     of face - 12/2010  . Peripheral vascular disease in diabetes mellitus   . Hyperlipidemia   . Tobacco abuse     0.5 - 1ppd x 21 yrs  . Headache(784.0)   . Renal disorder     glomerulonephritis  . Blood transfusion   . Anxiety   . Depression   . Peripheral vascular disease   . Diabetes mellitus     IDDM Since Age 50  . Embolism - blood clot   . Renal insufficiency     Past Surgical History  Procedure Laterality Date  . Wrist surgery      left  . Embolectomy  01/29/2011    Procedure: EMBOLECTOMY;  Surgeon: Mal Misty, MD;  Location: [facility] OR;  Service: Vascular;  Laterality: Left;  Left Popliteal and Tibial Embolectomy with patch angioplasty  . Dilation and curettage of uterus    . Multiple tooth extractions    . Amputation  03/03/2011    Procedure: AMPUTATION DIGIT;  Surgeon: Newt Minion, MD;  Location: [place];  Service: Orthopedics;  Laterality: Left;  Left Foot Amputation 4th and 5th toes at MTP joint  . Tee without cardioversion  04/15/2011    Procedure: TRANSESOPHAGEAL ECHOCARDIOGRAM (TEE);  Surgeon: [name], MD;  [facility];  Service: Cardiovascular;  Laterality: N/A;  . Amputation  04/22/2011    Procedure: AMPUTATION BELOW KNEE;  Surgeon: Newt Minion, MD;  Location: [place];  Service: Orthopedics;  Laterality: Left;  Left Below Knee Amputation    Family History  Problem Relation Age of Onset  . COPD Mother     alive - 35  . Hypertension Mother   . Diabetes Mother   . Stroke Mother   . Coronary artery disease Mother   . Diabetes Father     alive - 70  . Hypertension Father   . Coronary artery disease Father   . Anesthesia problems Neg Hx     Social History Ms. Mckeon reports that she has been passively  smoking Cigarettes.  She has a 21 pack-year smoking history. She has never used smokeless tobacco. Ms. Miesner reports that she does not drink alcohol.  Review of Systems  The patient at this time is not able to give me a review of systems. She responds to some of the request from the nurses.  Physical Examination Blood pressure 137/62, pulse 85, temperature 98.8 F (37.1 C), temperature source Oral, resp. rate 24, last menstrual period 12/28/2010, SpO2 98.00%.  Intake/Output Summary (Last 24 hours) at 10/01/12 1019 Last data filed at 10/01/12 1000  Gross per 24 hour  Intake 347.41 ml  Output    350 ml  Net  -2.59 ml   Physical examination   Patient does not respond to my questions. There is no jugulovenous distention. Lungs reveal scattered rhonchi. Cardiac exam her vitals S1 and S2. There no significant murmurs. The abdomen is soft. There is no peripheral edema.    Lab Results  Basic Metabolic Panel:  Recent Labs Lab 09/30/12 2000 09/30/12 2303 10/01/12 0350  NA 132* 140 144  K 5.2* 3.9 3.6  CL 95* 100 109  CO2 5* 8* 17*  GLUCOSE 792* 476* 159*  BUN 25* 23 19  CREATININE 1.23* 1.24* 1.00  CALCIUM 8.7 8.4 8.3*    Liver Function Tests:  Recent Labs Lab 09/30/12 2000 10/01/12 0350  AST 12 68*  ALT 10 18  ALKPHOS 92 79  BILITOT 0.2* 0.1*  PROT 6.3 5.8*  ALBUMIN 2.4* 2.1*    CBC:  Recent Labs Lab 09/30/12 2000 10/01/12 0350  WBC 17.4* 11.0*  NEUTROABS 15.6*  --   HGB 11.9* 11.7*  HCT 38.9 35.2*  MCV 96.8 87.6  PLT 510* 431*    Cardiac Enzymes:  Recent Labs Lab 09/30/12 2000 09/30/12 2303 10/01/12 0350  TROPONINI 0.33* 7.24* >20.00*    BNP: No components found with this basename: POCBNP,    Radiology: Ct Head Wo Contrast  09/30/2012   *RADIOLOGY REPORT*  Clinical Data: Altered mental status  CT HEAD WITHOUT CONTRAST  Technique:  Contiguous axial images were obtained from the base of the skull through the vertex without contrast.   Comparison: Prior MRI from 04/12/2011  Findings: Multiple scattered focal hypodense areas are seen within the parasagittal right frontal lobe, right caudate nucleus, and posterior right frontal lobe, likely not changed as compared to the prior MRI and CT.  Findings are consistent with remote ischemic infarcts.  No definite new ischemic infarct identified.  There is no acute intracranial hemorrhage. No midline shift or mass lesion.  The CSF containing spaces are within normal limits.  There is no hydrocephalous.  No extra-axial fluid collection.  Calvarium is intact.  Orbital soft  tissues are normal.  Paranasal sinuses and mastoid air cells are clear.  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  No definite new ischemic infarct or other acute intracranial process identified.   Original Report Authenticated By: Jeannine Boga, M.D.   Dg Chest Portable 1 View  09/30/2012   CLINICAL DATA:  Chest pain, altered mental status  EXAM: PORTABLE CHEST - 1 VIEW  COMPARISON:  None.  FINDINGS: The heart size and mediastinal contours are within normal limits. No acute infiltrate or pulmonary edema. The visualized skeletal structures are unremarkable.  IMPRESSION: No active disease.   Electronically Signed   By: Lahoma Crocker   On: 09/30/2012 19:53     ECG:  I have described EKGs above in the history of present illness   Impression and Recommendations    DKA (diabetic ketoacidoses)     This is being managed by the critical care team.    Peripheral vascular disease in diabetes mellitus    Thromboembolism of lower extremity artery      This event occurred in 2013. Eventually she required amputation.    Cerebral embolus with cerebral infarction      The patient has had multiple cerebral infarcts in the past. She was being treated with Coumadin for this. The current CT scan does not show any acute infarct.    Acute encephalopathy     Currently her mental status has not normal related  to the ketoacidosis.  History of anticoagulation     Patient has been on Coumadin as an outpatient for her multiple emboli in the past. INR was 1.2    Acute myocardial infarction, unspecified site, initial episode of care     This is a very complicated situation. Her mental status is abnormal. The nurses and I have spoken directly with the patient's mother. The patient's mother has given [name] permission to proceed with cardiac catheterization. I cannot obtain any history of chest pain for the patient as she is not responding well. I cannot tell how long her EKGs have been abnormal. The most pressing data is that her troponin is rising rapidly. She is a young woman. In addition, there are definite new inferior Q waves. There is also some anterior ST elevation. I have reviewed the situation with Dr. Elsworth Soho. I reviewed the situation with Dr. Martin[place]. We have decided that it is most prudent to proceed with cardiac catheterization to gather more data to help make the next decisions.  As part of this evaluation I spent greater than one hour with the overall assessment. Most of this time has been spent at the patient's bedside or with the nurse or with calling other physicians and with evaluating all records. I came urgently when I was notified that there was question of an acute MI.  Signed: Daryel November, MD

## 2012-10-01 NOTE — Progress Notes (Signed)
Pt with allergy to aspirin (d/t "kidney disease"); discussed with Roderic Palau, pharmacist and he stated that labs were ok for a one time dose; contacted [name], [place] for [facility]; order received to give one time dose of Aspirin 300 mg PR.  Lenor Coffin, RN

## 2012-10-01 NOTE — H&P (Signed)
PULMONARY  / CRITICAL CARE MEDICINE  Name: [name] MRN: [id number] DOB: August 30, 1970    ADMISSION DATE:  09/30/2012  PRIMARY SERVICE: PCCM  CHIEF COMPLAINT:  DKA  BRIEF PATIENT DESCRIPTION:  42 years old female with PMH relevant for DM type 1, PVD, CKD, CVA on coumadin and left BKA. Presents to [facility] on DKA. Found to have elevated troponin and transfer to [facility] was requested for Cardiology evaluation.   SIGNIFICANT EVENTS / STUDIES:  - EKG with no acute ST or T wave changes - CT scan of the head with no acute intracranial processes  - Chest X ray with no parenchymal lung infiltrates.  LINES / TUBES: - Peripheral IV's  CULTURES: Urine and blood cultures sent.  ANTIBIOTICS: No antibiotics  HISTORY OF PRESENT ILLNESS:   42 years old female resident of a SNF with PMH relevant for DM type 1, PVD, CKD, CVA on coumadin and left BKA. Presents to [facility] with AMS, nausea and vomiting. on DKA. As per the history there is questionable history of rectal bleeding. She was recently on ciprofloxacin for UTI and received Vit K for supratherapeutic INR.  Apparently the patient was complaining of chest pain but she denied having any CP to me. At [facility] she was found to be in severe DKA with pH of 6.9. DKA protocol was started and she received 2 amps of bicarb. A second troponin showed an increase from 0.33 to 7.24 with no EKG changes or active chest pain. Transfer to [facility] was requested for Cardiology evaluation. At the time of my exam the patient is lethargic but arousable. Denies CP, SOB, nausea, vomiting, abdominal pain or urinary symptoms. She is in no distress and hemodynamically stable. Saturating 99% on RA.   PAST MEDICAL HISTORY :  Past Medical History  Diagnosis Date  . Shingles     11/2010  . Cellulitis     of face - 12/2010  . Peripheral vascular disease in diabetes mellitus   . Hyperlipidemia   . Tobacco abuse     0.5 - 1ppd x 21 yrs  . Headache(784.0)   . Renal disorder      glomerulonephritis  . Blood transfusion   . Anxiety   . Depression   . Peripheral vascular disease   . Diabetes mellitus     IDDM Since Age 62  . Embolism - blood clot   . Renal insufficiency    Past Surgical History  Procedure Laterality Date  . Wrist surgery      left  . Embolectomy  01/29/2011    Procedure: EMBOLECTOMY;  Surgeon: Mal Misty, MD;  Location: [facility] OR;  Service: Vascular;  Laterality: Left;  Left Popliteal and Tibial Embolectomy with patch angioplasty  . Dilation and curettage of uterus    . Multiple tooth extractions    . Amputation  03/03/2011    Procedure: AMPUTATION DIGIT;  Surgeon: Newt Minion, MD;  Location: [place];  Service: Orthopedics;  Laterality: Left;  Left Foot Amputation 4th and 5th toes at MTP joint  . Tee without cardioversion  04/15/2011    Procedure: TRANSESOPHAGEAL ECHOCARDIOGRAM (TEE);  Surgeon: [name], MD;  [facility];  Service: Cardiovascular;  Laterality: N/A;  . Amputation  04/22/2011    Procedure: AMPUTATION BELOW KNEE;  Surgeon: Newt Minion, MD;  Location: [place];  Service: Orthopedics;  Laterality: Left;  Left Below Knee Amputation   Prior to Admission medications   Medication Sig Start Date  End Date Taking? Authorizing Provider  ALPRAZolam Duanne Moron) 1 MG tablet Take 1 mg by mouth 3 (three) times daily as needed for anxiety.   Yes Historical Provider, MD  ciprofloxacin (CIPRO) 500 MG tablet Take 500 mg by mouth 2 (two) times daily. For 7 days starting 09/28/12   Yes Historical Provider, MD  fluconazole (DIFLUCAN) 150 MG tablet Take 150 mg by mouth daily. For 2 weeks started on 09/21/12   Yes Historical Provider, MD  insulin aspart (NOVOLOG FLEXPEN) 100 UNIT/ML injection Inject 2-10 Units into the skin 3 (three) times daily before meals. Sliding scale as follows 200-250=2 units 251-300=4 units 301-350=6 units 351-400=8 units >400-give 10 units and Notify MD   Yes Historical Provider, MD  Insulin Glargine (LANTUS  SOLOSTAR) 100 UNIT/ML SOPN Inject 20 Units into the skin at bedtime.   Yes Historical Provider, MD  omeprazole (PRILOSEC) 20 MG capsule Take 20 mg by mouth daily.   Yes Historical Provider, MD  ondansetron (ZOFRAN) 8 MG tablet Take 8 mg by mouth every 8 (eight) hours as needed for nausea.   Yes Historical Provider, MD  oxyCODONE-acetaminophen (PERCOCET) 10-325 MG per tablet Take 1 tablet by mouth 4 (four) times daily.   Yes Historical Provider, MD  PARoxetine (PAXIL) 20 MG tablet Take 20 mg by mouth daily.   Yes Historical Provider, MD  promethazine (PHENERGAN) 25 MG tablet Take 25 mg by mouth every 6 (six) hours as needed for nausea.   Yes Historical Provider, MD  promethazine (PHENERGAN) 25 MG/ML injection Inject 25 mg into the vein every 6 (six) hours as needed for nausea or vomiting.   Yes Historical Provider, MD  sennosides-docusate sodium (SENOKOT-S) 8.6-50 MG tablet Take 1 tablet by mouth 2 (two) times daily.   Yes Historical Provider, MD  sodium chloride 0.45 % Inject 100 mLs into the vein 3 (three) times daily. Patient gets this every shift for dehydration(started on 09/27/12)   Yes Historical Provider, MD  warfarin (COUMADIN) 5 MG tablet Take 5 mg by mouth daily.   Yes Historical Provider, MD   Allergies  Allergen Reactions  . Aspirin Other (See Comments)    Cannot take this medication due to kidney disease  . Ibuprofen Other (See Comments)    Kidney disease  . Losartan Swelling    FAMILY HISTORY:  Family History  Problem Relation Age of Onset  . COPD Mother     alive - 64  . Hypertension Mother   . Diabetes Mother   . Stroke Mother   . Coronary artery disease Mother   . Diabetes Father     alive - 20  . Hypertension Father   . Coronary artery disease Father   . Anesthesia problems Neg Hx    SOCIAL HISTORY:  reports that she has been passively smoking Cigarettes.  She has a 21 pack-year smoking history. She has never used smokeless tobacco. She reports that she uses  illicit drugs (Marijuana) about once per week. She reports that she does not drink alcohol.  REVIEW OF SYSTEMS:  All systems reviewed and found negative except for what I mentioned in the HPI.  SUBJECTIVE:   VITAL SIGNS: Temp:  [98.9 F (37.2 C)] 98.9 F (37.2 C) (09/13 1856) Pulse Rate:  [89-147] 89 (09/14 0247) Resp:  [16-28] 26 (09/14 0247) BP: (83-160)/(40-75) 119/64 mmHg (09/14 0247) SpO2:  [96 %-100 %] 100 % (09/14 0247) HEMODYNAMICS:   VENTILATOR SETTINGS:   INTAKE / OUTPUT: Intake/Output   None     PHYSICAL  EXAMINATION: General: No apparent distress. Eyes: Anicteric sclerae. ENT: Oropharynx clear. Dry mucous membranes. No thrush Lymph: No cervical, supraclavicular, or axillary lymphadenopathy. Heart: Normal S1, S2. No murmurs, rubs, or gallops appreciated. No bruits, equal pulses. Lungs: Normal excursion, no dullness to percussion. Good air movement bilaterally, without wheezes or crackles. Normal upper airway sounds without evidence of stridor. Abdomen: Abdomen soft, non-tender and not distended, normoactive bowel sounds. No hepatosplenomegaly or masses. Rectal exam with brown stools. No blood. Musculoskeletal: No clubbing or synovitis. No edema. Left BKA Skin: No rashes or lesions Neuro: No focal neurologic deficits. Lethargic but arousable. Oriented x 3  LABS:  CBC Recent Labs     09/30/12  2000  WBC  17.4*  HGB  11.9*  HCT  38.9  PLT  510*   Coag's Recent Labs     09/30/12  2119  APTT  25  INR  1.23   BMET Recent Labs     09/30/12  2000  09/30/12  2303  NA  132*  140  K  5.2*  3.9  CL  95*  100  CO2  5*  8*  BUN  25*  23  CREATININE  1.23*  1.24*  GLUCOSE  792*  476*   Electrolytes Recent Labs     09/30/12  2000  09/30/12  2303  CALCIUM  8.7  8.4   Sepsis Markers No results found for this basename: LACTICACIDVEN, PROCALCITON, O2SATVEN,  in the last 72 hours ABG Recent Labs     09/30/12  2133  PHART  6.995*  PCO2ART  13.0*   PO2ART  97.9   Liver Enzymes Recent Labs     09/30/12  2000  AST  12  ALT  10  ALKPHOS  92  BILITOT  0.2*  ALBUMIN  2.4*   Cardiac Enzymes Recent Labs     09/30/12  2000  09/30/12  2303  TROPONINI  0.33*  7.24*   Glucose Recent Labs     09/30/12  1916  09/30/12  2130  09/30/12  2231  09/30/12  2339  10/01/12  0046  GLUCAP  >600*  583*  497*  340*  222*    Imaging Ct Head Wo Contrast  09/30/2012   *RADIOLOGY REPORT*  Clinical Data: Altered mental status  CT HEAD WITHOUT CONTRAST  Technique:  Contiguous axial images were obtained from the base of the skull through the vertex without contrast.  Comparison: Prior MRI from 04/12/2011  Findings: Multiple scattered focal hypodense areas are seen within the parasagittal right frontal lobe, right caudate nucleus, and posterior right frontal lobe, likely not changed as compared to the prior MRI and CT.  Findings are consistent with remote ischemic infarcts.  No definite new ischemic infarct identified.  There is no acute intracranial hemorrhage. No midline shift or mass lesion.  The CSF containing spaces are within normal limits.  There is no hydrocephalous.  No extra-axial fluid collection.  Calvarium is intact.  Orbital soft tissues are normal.  Paranasal sinuses and mastoid air cells are clear.  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  No definite new ischemic infarct or other acute intracranial process identified.   Original Report Authenticated By: Jeannine Boga, M.D.   Dg Chest Portable 1 View  09/30/2012   CLINICAL DATA:  Chest pain, altered mental status  EXAM: PORTABLE CHEST - 1 VIEW  COMPARISON:  None.  FINDINGS: The heart size and mediastinal contours are within normal limits. No acute infiltrate or pulmonary  edema. The visualized skeletal structures are unremarkable.  IMPRESSION: No active disease.   Electronically Signed   By: Lahoma Crocker   On: 09/30/2012 19:53       ASSESSMENT /  PLAN:  PULMONARY A: 1) No issues P:   - Will admit to MICU  CARDIOVASCULAR A:  1) Elevated troponin with no EKG changes and no active chest pain 2) Patient with questionable recent history of GI bleed. Rectal exam with no evidence of bleed. Sample sent for occult blood.  P:  - Will trend troponins - We will continue IVF resuscitation - Will consider heparin if chest pain or EKG changes - Will trend troponin q 6 hrs - Cardiology consult in am  RENAL A:   1) Acute on chronic renal failure. Creatinine in May 2014 was 1.17 P:   - Continue IVF resuscitation per DKA protocol - Will follow BMP  GASTROINTESTINAL A:   1) Questionable history of GI bleed. No macroscopic blood on rectal exam P:   - Stool sample sent for occult blood  - Protonix IV  HEMATOLOGIC A:   1) Mild anemia P:  - Will follow CBC  INFECTIOUS A:   1) No evidence of acute infection 20 Recently on ciprofloxacin for UTI P:   - Will follow cultures - No antibiotics  ENDOCRINE A:   1) DKA  P:   -DKA protocol -isotonic saline until euvolemic -insulin gtt per protocol,  transition to sq when anion gap is normal, bicarb is >18  -transition to D51/2 NS after blood glucose is <250, transition to NSL when able to take pos.  -close observation of K, replace as indicated.  -close observation of PO4 and replace as indicated  NEUROLOGIC A:   1) Lethargic, improving. Tox screen positive for benzodiazepines   I have personally obtained a history, examined the patient, evaluated laboratory and imaging results, formulated the assessment and plan and placed orders. CRITICAL CARE:  Critical Care Time devoted to patient care services described in this note is 60 minutes.   Waynetta Pean, MD Pulmonary and [place] Pager: [phone]  10/01/2012, 3:17 AM

## 2012-10-01 NOTE — CV Procedure (Signed)
Cardiac Catheterization Procedure Note  Name: [name] MRN: [id number] DOB: May 16, 1970  Procedure: Left Heart Cath, Selective Coronary Angiography, LV angiography, PTCA and stenting of the LAD  Indication: 42 year old white female transferred from [facility] to the critical care service early this morning for management of DKA. She has a history of prior CVA and left BKA. She has a history of diabetes mellitus and chronic kidney disease. Troponin since admission have increased from 0.33 to greater than 20. ECGs demonstrate low QRS voltage with new Q waves in leads 3 and aVF. There was mild ST elevation of 0.5-1 mm in leads V2-3. The patient's history was difficult given the patient's altered mental status. She was unable to give any history of chest pain. There was delay in diagnosis due to her poor history and nondiagnostic ECG changes. Emergent cardiac cath was recommended for anterior STEMI.  Procedural Details:  The right wrist was prepped, draped, and anesthetized with 1% lidocaine. Using the modified Seldinger technique, a 6 French sheath was introduced into the right radial artery. 3 mg of verapamil was administered through the sheath, weight-based unfractionated heparin was administered intravenously. Standard Judkins catheters were used for selective coronary angiography and left ventriculography. Catheter exchanges were performed over an exchange length guidewire.  PROCEDURAL FINDINGS Hemodynamics: AO 146/68 with a mean of 103 mmHg LV 145/16 mmHg   Coronary angiography: Coronary dominance: right  Left mainstem: The left main tapers to 20% distally.  Left anterior descending (LAD): The left anterior descending artery is occluded proximally.  There is a large ramus intermediate branch which has a 70-80% stenosis at the ostium.  Left circumflex (LCx): The left circumflex has mild disease proximally up to 30%.   Right coronary artery (RCA): The right coronary is a  large dominant vessel. It has a long segmental area of disease in the proximal to mid vessel up to 40%. The right coronary gives good collaterals to the LAD.  Left ventriculography: Left ventricular systolic function is abnormal, there is a focal area of akinesis in the distal inferior wall and apex. There is hypokinesis of the mid to distal anterior wall. Ejection fraction is estimated at 40-45%. There is no significant mitral regurgitation   PCI Note:  Following the diagnostic procedure, the decision was made to proceed with PCI of the LAD. The time course of her occlusion is unknown but felt to be within the past 12 hours. Given her young age I felt that revascularization was appropriate. Ticagelor 180 mg was given orally.  Weight-based heparin was given for anticoagulation. Once a therapeutic ACT was achieved, a 6 [place] XB LAD 3.0 guide catheter was inserted. I was unable to cross the occlusion with a pro-water wire. A Fielder XT coronary guidewire was used to cross the lesion.  The lesion was predilated with a 2.0 mm balloon.  The lesion was then stented with a 2.5 x 38 mm Promus premier stent. There was still a small area of disease in the proximal vessel that was not covered. We used a second 2.5 by 28mm Promus per meter stent and overlapping fashion to cover this area.   Following PCI, there was 0% residual stenosis and TIMI-3 flow. Final angiography confirmed an excellent result. The patient tolerated the procedure well. There were no immediate procedural complications. A TR band was used for radial hemostasis. The patient was transferred to the post catheterization recovery area for further monitoring.  PCI Data: Vessel - LAD/Segment - proximal to mid Percent Stenosis (  pre)  100% TIMI-flow 0 Stent 2.5 x 38 mm and 2.5 by 62mm Promus premier stents Percent Stenosis (post) 0% TIMI-flow (post) 3  Final Conclusions:   1. 2 vessel obstructive coronary disease with occlusion of the proximal to mid  LAD. Moderate disease at the ostium of the ramus intermediate branch. 2. Mild to moderate left ventricular dysfunction. 3. Successful stenting of the Roxanol to mid LAD with drug-eluting stents.   Recommendations:  Will treat initially with dual antiplatelet therapy with aspirin 81 mg daily and Brilinta. We'll need to review indication for anticoagulation.  Collier [facility] 10/01/2012, 12:10 PM

## 2012-10-01 NOTE — Progress Notes (Signed)
PULMONARY  / CRITICAL CARE MEDICINE  Name: [name] MRN: [id number] DOB: [birth year]    ADMISSION DATE:  09/30/2012  PRIMARY SERVICE: PCCM  CHIEF COMPLAINT:  DKA  BRIEF PATIENT DESCRIPTION:  42 years old female with PMH relevant for DM type 1, PVD, CKD, CVA on coumadin and left BKA. Presents to [facility] on DKA. Found to have elevated troponin and transfer to [facility] was requested for Cardiology evaluation.  9/14 EKG- evolved IWMI with ST elevation in V1-2  SIGNIFICANT EVENTS / STUDIES:  - CT scan of the head with no acute intracranial processes    LINES / TUBES: - Peripheral IV's  CULTURES: Urine and blood cultures sent.  ANTIBIOTICS: No antibiotics  HISTORY OF PRESENT ILLNESS:   42 years old female resident of a SNF with PMH relevant for DM type 1, PVD, CKD, CVA on coumadin and left BKA. Presents to [facility] with AMS, nausea and vomiting. on DKA. As per the history there is questionable history of rectal bleeding. She was recently on ciprofloxacin for UTI and received Vit K for supratherapeutic INR.  Apparently the patient was complaining of chest pain but she denied having any CP to me. At [facility] she was found to be in severe DKA with pH of 6.9. DKA protocol was started and she received 2 amps of bicarb. A second troponin showed an increase from 0.33 to 7.24 with no EKG changes or active chest pain. Transfer to [facility] was requested for Cardiology evaluation. At the time of my exam the patient is lethargic but arousable. Denies CP, SOB, nausea, vomiting, abdominal pain or urinary symptoms. She is in no distress and hemodynamically stable. Saturating 99% on RA.     SUBJECTIVE: denies CP, dyspnea C/o LLE pain   VITAL SIGNS: Temp:  [98.8 F (37.1 C)-99 F (37.2 C)] 98.8 F (37.1 C) (09/14 0752) Pulse Rate:  [89-147] 93 (09/14 0600) Resp:  [16-28] 24 (09/14 0600) BP: (83-160)/(40-75) 144/62 mmHg (09/14 0600) SpO2:  [96 %-100 %] 98 % (09/14 0600) HEMODYNAMICS:    VENTILATOR SETTINGS:   INTAKE / OUTPUT: Intake/Output     09/13 0701 - 09/14 0700 09/14 0701 - 09/15 0700   I.V. 235.4    IV Piggyback 100    Total Intake 335.4     Urine  350   Total Output   350   Net +335.4 -350          PHYSICAL EXAMINATION: General: No apparent distress. Eyes: Anicteric sclerae. ENT: Oropharynx clear. Dry mucous membranes. No thrush Lymph: No cervical, supraclavicular, or axillary lymphadenopathy. Heart: Normal S1, S2. No murmurs, rubs, or gallops appreciated. No bruits, equal pulses. Lungs: Normal excursion, no dullness to percussion. Good air movement bilaterally, without wheezes or crackles. Normal upper airway sounds without evidence of stridor. Abdomen: Abdomen soft, non-tender and not distended, normoactive bowel sounds. No hepatosplenomegaly or masses. Rectal exam with brown stools. No blood. Musculoskeletal: No clubbing or synovitis. No edema. Left BKA Skin: No rashes or lesions Neuro: No focal neurologic deficits. Lethargic but arousable. Oriented x 3  LABS:  CBC Recent Labs     09/30/12  2000  10/01/12  0350  WBC  17.4*  11.0*  HGB  11.9*  11.7*  HCT  38.9  35.2*  PLT  510*  431*   Coag's Recent Labs     09/30/12  2119  APTT  25  INR  1.23   BMET Recent Labs     09/30/12  2000  09/30/12  2303  10/01/12  0350  NA  132*  140  144  K  5.2*  3.9  3.6  CL  95*  100  109  CO2  5*  8*  17*  BUN  25*  23  19  CREATININE  1.23*  1.24*  1.00  GLUCOSE  792*  476*  159*   Electrolytes Recent Labs     09/30/12  2000  09/30/12  2303  10/01/12  0350  CALCIUM  8.7  8.4  8.3*   Sepsis Markers No results found for this basename: LACTICACIDVEN, PROCALCITON, O2SATVEN,  in the last 72 hours ABG Recent Labs     09/30/12  2133  PHART  6.995*  PCO2ART  13.0*  PO2ART  97.9   Liver Enzymes Recent Labs     09/30/12  2000  10/01/12  0350  AST  12  68*  ALT  10  18  ALKPHOS  92  79  BILITOT  0.2*  0.1*  ALBUMIN  2.4*  2.1*    Cardiac Enzymes Recent Labs     09/30/12  2000  09/30/12  2303  10/01/12  0350  TROPONINI  0.33*  7.24*  >20.00*   Glucose Recent Labs     09/30/12  2231  09/30/12  2339  10/01/12  0046  10/01/12  0331  10/01/12  0444  10/01/12  0547  GLUCAP  497*  340*  222*  152*  153*  172*    Imaging Ct Head Wo Contrast  09/30/2012   *RADIOLOGY REPORT*  Clinical Data: Altered mental status  CT HEAD WITHOUT CONTRAST  Technique:  Contiguous axial images were obtained from the base of the skull through the vertex without contrast.  Comparison: Prior MRI from 04/12/2011  Findings: Multiple scattered focal hypodense areas are seen within the parasagittal right frontal lobe, right caudate nucleus, and posterior right frontal lobe, likely not changed as compared to the prior MRI and CT.  Findings are consistent with remote ischemic infarcts.  No definite new ischemic infarct identified.  There is no acute intracranial hemorrhage. No midline shift or mass lesion.  The CSF containing spaces are within normal limits.  There is no hydrocephalous.  No extra-axial fluid collection.  Calvarium is intact.  Orbital soft tissues are normal.  Paranasal sinuses and mastoid air cells are clear.  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  No definite new ischemic infarct or other acute intracranial process identified.   Original Report Authenticated By: Jeannine Boga, M.D.   Dg Chest Portable 1 View  09/30/2012   CLINICAL DATA:  Chest pain, altered mental status  EXAM: PORTABLE CHEST - 1 VIEW  COMPARISON:  None.  FINDINGS: The heart size and mediastinal contours are within normal limits. No acute infiltrate or pulmonary edema. The visualized skeletal structures are unremarkable.  IMPRESSION: No active disease.   Electronically Signed   By: Lahoma Crocker   On: 09/30/2012 19:53       ASSESSMENT / PLAN:  PULMONARY A: 1) No issues P:    CARDIOVASCULAR A: STEMI 2) Patient with  questionable recent history of GI bleed. Rectal exam with no evidence of bleed. Sample sent for occult blood.  P:  -  trend troponins - heparin  - Cardiology consult   RENAL A:   1) Acute on chronic renal failure -improving P:   - D51/2  per DKA protocol - Will follow BMP  GASTROINTESTINAL A:   1) Questionable history  of GI bleed. No macroscopic blood on rectal exam P:   - Stool sample sent for occult blood  - Protonix IV  HEMATOLOGIC A:   1) Mild anemia P:  - Will follow CBC  INFECTIOUS A:   1) No evidence of acute infection- Recently on ciprofloxacin for UTI P:   - follow cultures - No antibiotics  ENDOCRINE A:   1) DKA  P:   -DKA protocol -insulin gtt per protocol,  transition to sq when anion gap is normal -close observation of K, replace as indicated.  -close observation of PO4 and replace as indicated  NEUROLOGIC A:   Lethargic, improving. Tox screen positive for benzodiazepines, head CT -old MCA strokes   I have personally obtained a history, examined the patient, evaluated laboratory and imaging results, formulated the assessment and plan and placed orders. CRITICAL CARE:  Addn Critical Care Time devoted to patient care services described in this note is 30 minutes.   Waynetta Pean, MD Pulmonary and [place] Pager: [phone]  10/01/2012, 8:35 AM

## 2012-10-01 NOTE — Progress Notes (Signed)
Received orders for Cardiac Cath, informed to continue insulin gtt and give aspirin suppository per Doctor Ron Parker.

## 2012-10-01 NOTE — H&P (View-Only) (Signed)
CARDIOLOGY CONSULT NOTE   Patient ID: JANARI AVIS MRN: [id number] DOB/AGE: 05/26/1970 42 y.o.  Admit Date: 09/30/2012 Primary Physician: Alonza Bogus, MD Consulting Cardiologist:  [facility] cardiology team   Ms. Gannett is a 42 y.o.female  The complex medical history. She has known significant diabetes. She presented with diabetic ketoacidosis. We have no significant history concerning whether she was having any chest pain before she was admitted. Cardiology is consulted because of abnormal EKGs and significant increase in troponin.  The patient had an embolus to her leg in 2013. She had an embolectomy. Despite this eventually she had gangrene of some of her toes. Eventually she required an amputation. Transesophageal echo was done in 2013. At that time there was no definite cardiac source of embolus. She had normal left ventricular function. However MRI has shown multiple embolic events to her head. On this basis Coumadin has been recommended. She is on Coumadin as an outpatient. Her INR on admission is 1.2.  EKGs from 2013 reveal no evidence of Q waves in the inferior leads. There is decreased anterior R wave progression in V1 to V3. EKGs this admission reveal large inferior Q waves. There is some anterior ST elevation in addition to the old decreased anterior R wave progression. Her troponin this admission has gone from slight elevation up to 20.   Allergies  Allergen Reactions  . Aspirin Other (See Comments)    Cannot take this medication due to kidney disease  . Ibuprofen Other (See Comments)    Kidney disease  . Losartan Swelling    Medications Scheduled Medications: . [START ON 10/02/2012] aspirin  324 mg Oral Pre-Cath  . metoprolol  5 mg Intravenous Q6H  . sodium bicarbonate      . sodium chloride  3 mL Intravenous Q12H     Infusions: . dextrose 5 % and 0.45% NaCl Stopped (10/01/12 0315)  . dextrose 5 % and 0.45% NaCl 125 mL/hr at 10/01/12 0315  . heparin 600  Units/hr (10/01/12 0800)  . insulin (NOVOLIN-R) infusion 1.1 Units/hr (10/01/12 0900)     PRN Medications:  sodium chloride, dextrose, sodium chloride   Past Medical History  Diagnosis Date  . Shingles     11/2010  . Cellulitis     of face - 12/2010  . Peripheral vascular disease in diabetes mellitus   . Hyperlipidemia   . Tobacco abuse     0.5 - 1ppd x 21 yrs  . Headache(784.0)   . Renal disorder     glomerulonephritis  . Blood transfusion   . Anxiety   . Depression   . Peripheral vascular disease   . Diabetes mellitus     IDDM Since Age 51  . Embolism - blood clot   . Renal insufficiency     Past Surgical History  Procedure Laterality Date  . Wrist surgery      left  . Embolectomy  01/29/2011    Procedure: EMBOLECTOMY;  Surgeon: Mal Misty, MD;  Location: [facility] OR;  Service: Vascular;  Laterality: Left;  Left Popliteal and Tibial Embolectomy with patch angioplasty  . Dilation and curettage of uterus    . Multiple tooth extractions    . Amputation  03/03/2011    Procedure: AMPUTATION DIGIT;  Surgeon: Newt Minion, MD;  Location: [place];  Service: Orthopedics;  Laterality: Left;  Left Foot Amputation 4th and 5th toes at MTP joint  . Tee without cardioversion  04/15/2011    Procedure: TRANSESOPHAGEAL ECHOCARDIOGRAM (TEE);  Surgeon: [name], MD;  Location[facility];  Service: Cardiovascular;  Laterality: N/A;  . Amputation  04/22/2011    Procedure: AMPUTATION BELOW KNEE;  Surgeon: Newt Minion, MD;  Location: [place];  Service: Orthopedics;  Laterality: Left;  Left Below Knee Amputation    Family History  Problem Relation Age of Onset  . COPD Mother     alive - 24  . Hypertension Mother   . Diabetes Mother   . Stroke Mother   . Coronary artery disease Mother   . Diabetes Father     alive - 33  . Hypertension Father   . Coronary artery disease Father   . Anesthesia problems Neg Hx     Social History Ms. Bevis reports that she has been passively  smoking Cigarettes.  She has a 21 pack-year smoking history. She has never used smokeless tobacco. Ms. Arena reports that she does not drink alcohol.  Review of Systems  The patient at this time is not able to give me a review of systems. She responds to some of the request from the nurses.  Physical Examination Blood pressure 137/62, pulse 85, temperature 98.8 F (37.1 C), temperature source Oral, resp. rate 24, last menstrual period 12/28/2010, SpO2 98.00%.  Intake/Output Summary (Last 24 hours) at 10/01/12 1019 Last data filed at 10/01/12 1000  Gross per 24 hour  Intake 347.41 ml  Output    350 ml  Net  -2.59 ml   Physical examination   Patient does not respond to my questions. There is no jugulovenous distention. Lungs reveal scattered rhonchi. Cardiac exam her vitals S1 and S2. There no significant murmurs. The abdomen is soft. There is no peripheral edema.    Lab Results  Basic Metabolic Panel:  Recent Labs Lab 09/30/12 2000 09/30/12 2303 10/01/12 0350  NA 132* 140 144  K 5.2* 3.9 3.6  CL 95* 100 109  CO2 5* 8* 17*  GLUCOSE 792* 476* 159*  BUN 25* 23 19  CREATININE 1.23* 1.24* 1.00  CALCIUM 8.7 8.4 8.3*    Liver Function Tests:  Recent Labs Lab 09/30/12 2000 10/01/12 0350  AST 12 68*  ALT 10 18  ALKPHOS 92 79  BILITOT 0.2* 0.1*  PROT 6.3 5.8*  ALBUMIN 2.4* 2.1*    CBC:  Recent Labs Lab 09/30/12 2000 10/01/12 0350  WBC 17.4* 11.0*  NEUTROABS 15.6*  --   HGB 11.9* 11.7*  HCT 38.9 35.2*  MCV 96.8 87.6  PLT 510* 431*    Cardiac Enzymes:  Recent Labs Lab 09/30/12 2000 09/30/12 2303 10/01/12 0350  TROPONINI 0.33* 7.24* >20.00*    BNP: No components found with this basename: POCBNP,    Radiology: Ct Head Wo Contrast  09/30/2012   *RADIOLOGY REPORT*  Clinical Data: Altered mental status  CT HEAD WITHOUT CONTRAST  Technique:  Contiguous axial images were obtained from the base of the skull through the vertex without contrast.   Comparison: Prior MRI from 04/12/2011  Findings: Multiple scattered focal hypodense areas are seen within the parasagittal right frontal lobe, right caudate nucleus, and posterior right frontal lobe, likely not changed as compared to the prior MRI and CT.  Findings are consistent with remote ischemic infarcts.  No definite new ischemic infarct identified.  There is no acute intracranial hemorrhage. No midline shift or mass lesion.  The CSF containing spaces are within normal limits.  There is no hydrocephalous.  No extra-axial fluid collection.  Calvarium is intact.  Orbital soft  tissues are normal.  Paranasal sinuses and mastoid air cells are clear.  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  No definite new ischemic infarct or other acute intracranial process identified.   Original Report Authenticated By: Jeannine Boga, M.D.   Dg Chest Portable 1 View  09/30/2012   CLINICAL DATA:  Chest pain, altered mental status  EXAM: PORTABLE CHEST - 1 VIEW  COMPARISON:  None.  FINDINGS: The heart size and mediastinal contours are within normal limits. No acute infiltrate or pulmonary edema. The visualized skeletal structures are unremarkable.  IMPRESSION: No active disease.   Electronically Signed   By: Lahoma Crocker   On: 09/30/2012 19:53     ECG:  I have described EKGs above in the history of present illness   Impression and Recommendations    DKA (diabetic ketoacidoses)     This is being managed by the critical care team.    Peripheral vascular disease in diabetes mellitus    Thromboembolism of lower extremity artery      This event occurred in 2013. Eventually she required amputation.    Cerebral embolus with cerebral infarction      The patient has had multiple cerebral infarcts in the past. She was being treated with Coumadin for this. The current CT scan does not show any acute infarct.    Acute encephalopathy     Currently her mental status has not normal related  to the ketoacidosis.  History of anticoagulation     Patient has been on Coumadin as an outpatient for her multiple emboli in the past. INR was 1.2    Acute myocardial infarction, unspecified site, initial episode of care     This is a very complicated situation. Her mental status is abnormal. The nurses and I have spoken directly with the patient's mother. The patient's mother has given [name] permission to proceed with cardiac catheterization. I cannot obtain any history of chest pain for the patient as she is not responding well. I cannot tell how long her EKGs have been abnormal. The most pressing data is that her troponin is rising rapidly. She is a young woman. In addition, there are definite new inferior Q waves. There is also some anterior ST elevation. I have reviewed the situation with Dr. Elsworth Soho. I reviewed the situation with Dr. [place]. We have decided that it is most prudent to proceed with cardiac catheterization to gather more data to help make the next decisions.  As part of this evaluation I spent greater than one hour with the overall assessment. Most of this time has been spent at the patient's bedside or with the nurse or with calling other physicians and with evaluating all records. I came urgently when I was notified that there was question of an acute MI.  Signed: Daryel November, MD

## 2012-10-02 DIAGNOSIS — F17200 Nicotine dependence, unspecified, uncomplicated: Secondary | ICD-10-CM

## 2012-10-02 DIAGNOSIS — M79609 Pain in unspecified limb: Secondary | ICD-10-CM

## 2012-10-02 DIAGNOSIS — E119 Type 2 diabetes mellitus without complications: Secondary | ICD-10-CM

## 2012-10-02 LAB — BASIC METABOLIC PANEL
BUN: 8 mg/dL (ref 6–23)
BUN: 7 mg/dL (ref 6–23)
CO2: 20 mEq/L (ref 19–32)
CO2: 22 mEq/L (ref 19–32)
Calcium: 7.7 mg/dL — ABNORMAL LOW (ref 8.4–10.5)
Calcium: 8 mg/dL — ABNORMAL LOW (ref 8.4–10.5)
Chloride: 102 mEq/L (ref 96–112)
Chloride: 104 mEq/L (ref 96–112)
Creatinine, Ser: 0.71 mg/dL (ref 0.50–1.10)
Creatinine, Ser: 0.66 mg/dL (ref 0.50–1.10)
GFR calc Af Amer: >90 mL/min (ref >90)
GFR calc Af Amer: >90 mL/min (ref >90)
GFR calc non Af Amer: >90 mL/min (ref >90)
GFR calc non Af Amer: >90 mL/min (ref >90)
Glucose, Bld: 226 mg/dL — ABNORMAL HIGH (ref 70–99)
Glucose, Bld: 151 mg/dL — ABNORMAL HIGH (ref 70–99)
Potassium: 3.2 mEq/L — ABNORMAL LOW (ref 3.5–5.1)
Potassium: 2.9 mEq/L — ABNORMAL LOW (ref 3.5–5.1)
Sodium: 136 mEq/L (ref 135–145)
Sodium: 138 mEq/L (ref 135–145)

## 2012-10-02 LAB — CBC
HCT: 35.4 % — ABNORMAL LOW (ref 36.0–46.0)
Hemoglobin: 12.1 g/dL (ref 12.0–15.0)
MCH: 29.2 pg (ref 26.0–34.0)
MCHC: 34.2 g/dL (ref 30.0–36.0)
MCV: 85.3 fL (ref 78.0–100.0)
Platelets: 428 10*3/uL — ABNORMAL HIGH (ref 150–400)
RBC: 4.15 MIL/uL (ref 3.87–5.11)
RDW: 14.4 % (ref 11.5–15.5)
WBC: 9.7 10*3/uL (ref 4.0–10.5)

## 2012-10-02 LAB — URINE CULTURE
Colony Count: NO GROWTH
Culture: NO GROWTH

## 2012-10-02 LAB — GLUCOSE, CAPILLARY
Glucose-Capillary: 172 mg/dL — ABNORMAL HIGH (ref 70–99)
Glucose-Capillary: 164 mg/dL — ABNORMAL HIGH (ref 70–99)
Glucose-Capillary: 125 mg/dL — ABNORMAL HIGH (ref 70–99)
Glucose-Capillary: 144 mg/dL — ABNORMAL HIGH (ref 70–99)
Glucose-Capillary: 140 mg/dL — ABNORMAL HIGH (ref 70–99)
Glucose-Capillary: 305 mg/dL — ABNORMAL HIGH (ref 70–99)
Glucose-Capillary: 105 mg/dL — ABNORMAL HIGH (ref 70–99)

## 2012-10-02 LAB — TROPONIN I: Troponin I: >20 ng/mL (ref <0.30)

## 2012-10-02 LAB — POCT ACTIVATED CLOTTING TIME
Activated Clotting Time: 252 seconds
Activated Clotting Time: 288 seconds

## 2012-10-02 MED ORDER — POTASSIUM CHLORIDE CRYS ER 20 MEQ PO TBCR
40.0000 meq | EXTENDED_RELEASE_TABLET | Freq: Once | ORAL | Status: AC
  Administered 2012-10-02: 40 meq via ORAL
  Filled 2012-10-02: qty 2

## 2012-10-02 MED ORDER — ALPRAZOLAM 0.25 MG PO TABS
0.2500 mg | ORAL_TABLET | Freq: Three times a day (TID) | ORAL | Status: DC | PRN
  Administered 2012-10-02: 0.25 mg via ORAL
  Filled 2012-10-02: qty 1

## 2012-10-02 MED ORDER — POTASSIUM CHLORIDE CRYS ER 20 MEQ PO TBCR
40.0000 meq | EXTENDED_RELEASE_TABLET | ORAL | Status: AC
  Administered 2012-10-02 (×2): 40 meq via ORAL
  Filled 2012-10-02 (×2): qty 2

## 2012-10-02 MED ORDER — ALPRAZOLAM 0.5 MG PO TABS
1.0000 mg | ORAL_TABLET | Freq: Three times a day (TID) | ORAL | Status: DC | PRN
  Administered 2012-10-04: 1 mg via ORAL
  Filled 2012-10-02: qty 2

## 2012-10-02 MED ORDER — PAROXETINE HCL 20 MG PO TABS
20.0000 mg | ORAL_TABLET | Freq: Every day | ORAL | Status: DC
  Administered 2012-10-02 – 2012-10-04 (×3): 20 mg via ORAL
  Filled 2012-10-02 (×4): qty 1

## 2012-10-02 NOTE — Progress Notes (Signed)
VASCULAR LAB PRELIMINARY  PRELIMINARY  PRELIMINARY  PRELIMINARY  Right lower extremity venous duplex completed.    Preliminary report:  Right:  No evidence of DVT, superficial thrombosis, or Baker's cyst.  [name], RVS 10/02/2012, 11:32 AM

## 2012-10-02 NOTE — Progress Notes (Signed)
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 Presents to [facility] on DKA. Found to have elevated troponin and transfer to [facility] was requested for Cardiology evaluation.  9/14 EKG- evolved IWMI with ST elevation in V1-2  Pt states she had episodes of dark red blood in stool 2 days prior to 09/30/12.   SIGNIFICANT EVENTS / STUDIES:  - 09/30/12 CT scan of the head with no acute intracranial processes  - 10/01/12 Echocardiogram with LV EF 40% - 45% - 10/01/12 Left Heart Cath, Selective Coronary Angiography, LV angiography, PTCA and stenting of the prox LAD, EF 40%  LINES / TUBES: - Peripheral IV's  CULTURES: Urine and blood cultures sent.  ANTIBIOTICS: No antibiotics  SUBJECTIVE   Transfer to [facility] was requested for Cardiology evaluation. Patient was found to have an anterior STEMI with a LV EF of 40% - 45% emergent cardiac cath was done, 10/01/12.   C/o right lateral thigh pain today, 10/01/12. Cardiology ordered doppler study for further assessment.   Denies CP, SOB, nausea, vomiting, abdominal pain or urinary symptoms. She is in no distress and hemodynamically stable. Saturating 99% on RA.     VITAL SIGNS: Temp:  [97.6 F (36.4 C)-99.1 F (37.3 C)] 99.1 F (37.3 C) (09/15 0745) Pulse Rate:  [69-97] 97 (09/15 1000) Resp:  [6-25] 7 (09/15 1000) BP: (88-143)/(40-106) 139/106 mmHg (09/15 1000) SpO2:  [97 %-100 %] 99 % (09/15 1000) HEMODYNAMICS:   VENTILATOR SETTINGS:   INTAKE / OUTPUT: Intake/Output     09/14 0701 - 09/15 0700 09/15 0701 - 09/16 0700   P.O. 480    I.V. (mL/kg) 863.9 (14.7)    IV Piggyback 560    Total Intake(mL/kg) 1903.9 (32.4)    Urine (mL/kg/hr) 1150 (0.8) 250 (1)   Stool 1 (0)    Total Output 1151  250   Net +752.9 -250        Urine Occurrence 1 x 1 x   Stool Occurrence 1 x 1 x   Emesis Occurrence       PHYSICAL EXAMINATION: General: No apparent distress. Eyes: Anicteric sclerae. ENT: Oropharynx clear. Dry mucous membranes. No thrush Lymph: No cervical, supraclavicular, or axillary lymphadenopathy. Heart: Normal S1, S2. No murmurs, rubs, or gallops appreciated. No bruits, equal pulses. Lungs: Normal excursion, no dullness to percussion. Good air movement bilaterally, without wheezes or crackles.  Abdomen: Abdomen soft, non-tender and not distended, normoactive bowel sounds. No hepatosplenomegaly or masses. Musculoskeletal: No clubbing or synovitis. No edema. Left BKA, tenderness along rt thigh Skin: No rashes or lesions Neuro: No focal neurologic deficits. Lethargic but arousable. Oriented x 3  LABS:  CBC Recent Labs     09/30/12  2000  10/01/12  0350  10/02/12  0515  WBC  17.4*  11.0*  9.7  HGB  11.9*  11.7*  12.1  HCT  38.9  35.2*  35.4*  PLT  510*  431*  428*   Coag's Recent Labs     09/30/12  2119  APTT  25  INR  1.23   BMET Recent Labs     10/01/12  1444  10/01/12  2313  10/02/12  0515  NA  137  136  138  K  3.1*  3.2*  2.9*  CL  105  102  104  CO2  22  20  22   BUN  12  8  7   CREATININE  0.80  0.71  0.66  GLUCOSE  141*  226*  151*   Electrolytes Recent Labs     10/01/12  0935  10/01/12  1444  10/01/12  2313  10/02/12  0515  CALCIUM  8.0*  7.7*  7.7*  8.0*  MG  1.6   --    --    --   PHOS  1.3*   --    --    --    Sepsis Markers No results found for this basename: LACTICACIDVEN, PROCALCITON, O2SATVEN,  in the last 72 hours ABG Recent Labs     09/30/12  2133  PHART  6.995*  PCO2ART  13.0*  PO2ART  97.9   Liver Enzymes Recent Labs     09/30/12  2000  10/01/12  0350  AST  12  68*  ALT  10  18  ALKPHOS  92  79  BILITOT  0.2*  0.1*  ALBUMIN  2.4*  2.1*   Cardiac Enzymes Recent Labs     10/01/12  0350  10/01/12  0935   10/01/12  1444  TROPONINI  >20.00*  >20.00*  16.23*   Glucose Recent Labs     10/01/12  1542  10/01/12  1638  10/01/12  1730  10/01/12  1836  10/01/12  2152  10/02/12  0743  GLUCAP  178*  172*  164*  125*  245*  144*    Imaging Ct Head Wo Contrast  09/30/2012   *RADIOLOGY REPORT*  Clinical Data: Altered mental status  CT HEAD WITHOUT CONTRAST  Technique:  Contiguous axial images were obtained from the base of the skull through the vertex without contrast.  Comparison: Prior MRI from 04/12/2011  Findings: Multiple scattered focal hypodense areas are seen within the parasagittal right frontal lobe, right caudate nucleus, and posterior right frontal lobe, likely not changed as compared to the prior MRI and CT.  Findings are consistent with remote ischemic infarcts.  No definite new ischemic infarct identified.  There is no acute intracranial hemorrhage. No midline shift or mass lesion.  The CSF containing spaces are within normal limits.  There is no hydrocephalous.  No extra-axial fluid collection.  Calvarium is intact.  Orbital soft tissues are normal.  Paranasal sinuses and mastoid air cells are clear.  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  No definite new ischemic infarct or other acute intracranial process identified.   Original Report Authenticated By: Jeannine Boga, M.D.   Dg Chest Portable 1 View  09/30/2012   CLINICAL DATA:  Chest pain, altered mental status  EXAM: PORTABLE CHEST - 1 VIEW  COMPARISON:  None.  FINDINGS: The heart size and mediastinal contours are within normal limits. No acute infiltrate or pulmonary edema. The visualized skeletal structures are unremarkable.  IMPRESSION: No active disease.   Electronically Signed   By: Lahoma Crocker   On: 09/30/2012 19:53       ASSESSMENT / PLAN:  PULMONARY A: 1) No issues P:    CARDIOVASCULAR A:  1)STEMI 2) Patient with questionable recent history of GI bleed. Rectal exam with no evidence  of bleed. Sample sent for occult blood.  3) Right thigh tenderness w/ Hx Thromboembolism of left lower extremity  P:  - 10/01/12 Left Heart Cath, Selective Coronary Angiography, LV  angiography, PTCA and stenting of the LAD, managed by Cardio, continue Metoprolol, ASA/ brilinta, consider ACE - venous duplex for RLE pain - neg  RENAL A:   1) Acute on chronic renal failure -improving P:   - Will follow BMP  GASTROINTESTINAL A:   1) Questionable history of GI bleed. No macroscopic blood on rectal exam P:   - Stool sample negative for occult blood - Consult GI for possible ulcer or other etiologies if bleed recurs  HEMATOLOGIC A:   1) Mild anemia P:  - Will follow CBC  INFECTIOUS A:   1) No evidence of acute infection- Recently on ciprofloxacin for UTI P:   - follow cultures - No antibiotics  ENDOCRINE A:   1) DKA  P:   -DKA protocol off  -insulin gtt off,   transitioned to sq -close observation of K, replace as indicated.  -close observation of PO4 and replace as indicated  NEUROLOGIC A:   Lethargic, much improved. Tox screen positive for benzodiazepines, head CT -old MCA strokes  Transfer to Telemetry , [facility] to pick up 9/15  I have personally obtained a history, examined the patient, evaluated laboratory and imaging results, formulated the assessment and plan and placed orders.   Bard Herbert, PA-S [facility] and [place] Pager: [phone]  Independently examined pt, evaluated data & formulated above care plan with NP who scribed this note & edited by me.   ALVA,RAKESH V.   10/02/2012, 11:15 AM

## 2012-10-02 NOTE — Progress Notes (Signed)
[facility] Progress Note Patient Name: [name] DOB: 1970/09/09 MRN: [id number]  Date of Service  10/02/2012   HPI/Events of Note  Hypokalemia   eICU Interventions  Potassium replaced   Intervention Category Intermediate Interventions: Electrolyte abnormality - evaluation and management  DETERDING,ELIZABETH 10/02/2012, 7:03 AM

## 2012-10-02 NOTE — Care Management Note (Addendum)
  Page 2 of 2   10/04/2012     10:38:34 AM   CARE MANAGEMENT NOTE 10/04/2012  Patient:  [name], [name]   Account Number:  [id number]  Date Initiated:  10/01/2012  Documentation initiated by:  [facility]  Subjective/Objective Assessment:   DKA - positive troponins.     Action/Plan:   Anticipated DC Date:  10/04/2012   Anticipated DC Plan:  [place]  CM consult  Medication Assistance      Choice offered to / List presented to:             Status of service:  Completed, signed off Medicare Important Message given?   (If response is "NO", the following Medicare IM given date fields will be blank) Date Medicare IM given:   Date Additional Medicare IM given:    Discharge Disposition:  HOME/SELF CARE  Per UR Regulation:  Reviewed for med. necessity/level of care/duration of stay  If discussed at [place] of Stay Meetings, dates discussed:    Comments:  ContactKaory, [place] Mother [place] [phone] [place] Daughter [phone]   10-04-12 [address] [place] [phone] Pt planned for d/c today. Pt does not want to go back to SNF at this time and chooses home.  Per PT notes recommendations for [facility] PT. Per pt's insurance will not cover [facility] PT due to not having qualifying diagnosis. CM will see if outpatient therapy will be an option. CM did speak to pt and her daughter will be home with her until she goes to work. Pt did mention [employer]- she will have to call her PCP to increase the hours. She has an aide to come out 1 hr per day as of now. CM did call [place] to make sure brilinta is in stock and it is.  Pt is on Brilinta - please call Prior authorization # [id number]. No further needs from CM at this time.  9/15 1214p debbie dowell rn,bsn gave pt 30day free brilinta and copay assist card.

## 2012-10-02 NOTE — Progress Notes (Signed)
[facility] Progress Note Patient Name: [name] DOB: September 15, 1970 MRN: [id number]  Date of Service  10/02/2012   HPI/Events of Note  Hypokalemia   eICU Interventions  Potassium replaced   Intervention Category Minor Interventions: Electrolytes abnormality - evaluation and management  DETERDING,ELIZABETH 10/02/2012, 12:18 AM

## 2012-10-02 NOTE — Progress Notes (Addendum)
TELEMETRY: Reviewed telemetry pt in NSR: Filed Vitals:   10/02/12 0500 10/02/12 0600 10/02/12 0700 10/02/12 0745  BP: 132/62 125/52 129/51   Pulse: 69 72 73   Temp:    99.1 F (37.3 C)  TempSrc:    Oral  Resp: 19 6 15    Weight:      SpO2: 98% 99% 100%     Intake/Output Summary (Last 24 hours) at 10/02/12 0814 Last data filed at 10/02/12 0400  Gross per 24 hour  Intake 1903.9 ml  Output    801 ml  Net 1102.9 ml    SUBJECTIVE Feels very well today. Denies any chest pain or SOB. Complains of pain on the lateral aspect of her right thigh.  LABS: Basic Metabolic Panel:  Recent Labs  10/01/12 0350 10/01/12 0935  10/01/12 2313 10/02/12 0515  NA 144 142  < > 136 138  K 3.6 3.6  < > 3.2* 2.9*  CL 109 109  < > 102 104  CO2 17* 20  < > 20 22  GLUCOSE 159* 174*  < > 226* 151*  BUN 19 15  < > 8 7  CREATININE 1.00 0.77  < > 0.71 0.66  CALCIUM 8.3* 8.0*  < > 7.7* 8.0*  MG  --  1.6  --   --   --   PHOS  --  1.3*  --   --   --   < > = values in this interval not displayed. Liver Function Tests:  Recent Labs  09/30/12 2000 10/01/12 0350  AST 12 68*  ALT 10 18  ALKPHOS 92 79  BILITOT 0.2* 0.1*  PROT 6.3 5.8*  ALBUMIN 2.4* 2.1*   CBC:  Recent Labs  09/30/12 2000 10/01/12 0350 10/02/12 0515  WBC 17.4* 11.0* 9.7  NEUTROABS 15.6*  --   --   HGB 11.9* 11.7* 12.1  HCT 38.9 35.2* 35.4*  MCV 96.8 87.6 85.3  PLT 510* 431* 428*   Cardiac Enzymes:  Recent Labs  10/01/12 0350 10/01/12 0935 10/01/12 1444  TROPONINI >20.00* >20.00* 16.23*   Radiology/Studies:  Ct Head Wo Contrast  09/30/2012   *RADIOLOGY REPORT*  Clinical Data: Altered mental status  CT HEAD WITHOUT CONTRAST  Technique:  Contiguous axial images were obtained from the base of the skull through the vertex without contrast.  Comparison: Prior MRI from 04/12/2011  Findings: Multiple scattered focal hypodense areas are seen within the parasagittal right frontal lobe, right caudate nucleus, and  posterior right frontal lobe, likely not changed as compared to the prior MRI and CT.  Findings are consistent with remote ischemic infarcts.  No definite new ischemic infarct identified.  There is no acute intracranial hemorrhage. No midline shift or mass lesion.  The CSF containing spaces are within normal limits.  There is no hydrocephalous.  No extra-axial fluid collection.  Calvarium is intact.  Orbital soft tissues are normal.  Paranasal sinuses and mastoid air cells are clear.  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  No definite new ischemic infarct or other acute intracranial process identified.   Original Report Authenticated By: Jeannine Boga, M.D.   Dg Chest Portable 1 View  09/30/2012   CLINICAL DATA:  Chest pain, altered mental status  EXAM: PORTABLE CHEST - 1 VIEW  COMPARISON:  None.  FINDINGS: The heart size and mediastinal contours are within normal limits. No acute infiltrate or pulmonary edema. The visualized skeletal structures are unremarkable.  IMPRESSION: No active disease.   Electronically  Signed   By: Lahoma Crocker   On: 09/30/2012 19:53   Ecg: NSR. LAD, Anteroseptal infarct. ST elevation resolved. T wave inversion V1-5. Q waves V1-3.   Echo:Study Conclusions  - Left ventricle: Technically difficult study. The cavity size was normal. Wall thickness was increased in a pattern of moderate LVH. Systolic function was mildly to moderately reduced. The estimated ejection fraction was in the range of 40% to 45%. There was an increased relative contribution of atrial contraction to ventricular filling. Doppler parameters are consistent with abnormal left ventricular relaxation (grade 1 diastolic dysfunction). Doppler parameters are consistent with high ventricular filling pressure. - Regional wall motion abnormality: Severe hypokinesis of the mid anterior, apical septal, and apical myocardium; hypokinesis of the basal anteroseptal myocardium;  moderate hypokinesis of the apical anterior, mid anteroseptal, mid inferoseptal, and apical inferior myocardium. - Mitral valve: Calcified annulus. Mildly thickened leaflets . Mild posterior leaflet prolapse. Trivial regurgitation. - Left atrium: The atrium was mildly dilated.   PHYSICAL EXAM General: Well developed, well nourished, in no acute distress. Head: Normal Neck: Negative for carotid bruits. JVD not elevated. Lungs: Clear bilaterally to auscultation without wheezes, rales, or rhonchi. Breathing is unlabored. Heart: RRR S1 S2 without murmurs, rubs, or gallops.  Abdomen: Soft, non-tender, non-distended with normoactive bowel sounds. No hepatomegaly. No rebound/guarding. No obvious abdominal masses. Msk:  No erythema or bruising right thigh. Feet warm. S/p L BKA. Extremities: No clubbing, cyanosis or edema.  Distal pedal pulses are 2+ and equal bilaterally. Neuro: Alert and oriented X 3. Moves all extremities spontaneously. Psych:  Responds to questions appropriately with a normal affect.  ASSESSMENT AND PLAN: 1. Anterior STEMI- late presentation. S/p stents DES to proximal to mid LAD. No chest pain. Continue dual antiplatelet therapy with ASA and Brilinta. ASA is listed as "Allergy" due to CKD. No actual ASA allergy noted. No indication for long term anticoagulation with coumadin. This was started when she had embolus to LE and CVAs. No embolic source found. No history of atrial fibrillation. Neuro previously recommended antiplatelet Rx. I will not resume coumadin. Continue metoprolol. History of "swelling" with ARB so will avoid ACEi or ARB now. 2.DKA- per CCM 3. Hypokalemia- replete. 4. Acute encephalopathy- improved.  5. S/p left BKA. 6. Tobacco abuse. Smoking cessation advised. 7. History of CVAs.   OK to progress activity from cardiac standpoint.   Principal Problem:   DKA (diabetic ketoacidoses) Active Problems:   Diabetes mellitus   Peripheral vascular disease in  diabetes mellitus   Thromboembolism of lower extremity artery   Cerebral embolus with cerebral infarction   Acute encephalopathy   Acute myocardial infarction, unspecified site, initial episode of care   HX: anticoagulation    Signed, Lisa Tsukamoto [place] MD,FACC 10/02/2012 8:23 AM

## 2012-10-02 NOTE — Progress Notes (Signed)
CARDIAC REHAB PHASE I   PRE:  Rate/Rhythm: 78SR  BP:  Supine: 100/54  Sitting:   Standing:    SaO2: 99%RA  MODE:  Ambulation:   Pivoted to wheelchair ft   POST:  Rate/Rhythm: 92 SR  BP:  Supine:   Sitting: 119/60  Standing:    SaO2:  1420-1450 Came to walk with pt as we thought pt had prosthesis here now and results of leg test available. Obtained vital signs and got prosthesis. Not all parts here so cannot put on pt. Family called to get rest of parts here later . Helped pt pivot to wheelchair as she is getting ready to transfer. We will follow up tomorrow. Pt disappointed, was ready to walk.   Graylon Good, RN BSN  10/02/2012 2:46 PM

## 2012-10-03 DIAGNOSIS — I743 Embolism and thrombosis of arteries of the lower extremities: Secondary | ICD-10-CM

## 2012-10-03 DIAGNOSIS — F29 Unspecified psychosis not due to a substance or known physiological condition: Secondary | ICD-10-CM

## 2012-10-03 LAB — GLUCOSE, CAPILLARY
Glucose-Capillary: 57 mg/dL — ABNORMAL LOW (ref 70–99)
Glucose-Capillary: 134 mg/dL — ABNORMAL HIGH (ref 70–99)
Glucose-Capillary: 145 mg/dL — ABNORMAL HIGH (ref 70–99)
Glucose-Capillary: 106 mg/dL — ABNORMAL HIGH (ref 70–99)
Glucose-Capillary: 236 mg/dL — ABNORMAL HIGH (ref 70–99)

## 2012-10-03 LAB — BASIC METABOLIC PANEL
BUN: 7 mg/dL (ref 6–23)
CO2: 21 mEq/L (ref 19–32)
Calcium: 8.1 mg/dL — ABNORMAL LOW (ref 8.4–10.5)
Chloride: 106 mEq/L (ref 96–112)
Creatinine, Ser: 0.66 mg/dL (ref 0.50–1.10)
GFR calc Af Amer: >90 mL/min (ref >90)
GFR calc non Af Amer: >90 mL/min (ref >90)
Glucose, Bld: 114 mg/dL — ABNORMAL HIGH (ref 70–99)
Potassium: 3.9 mEq/L (ref 3.5–5.1)
Sodium: 136 mEq/L (ref 135–145)

## 2012-10-03 LAB — CBC
HCT: 32.3 % — ABNORMAL LOW (ref 36.0–46.0)
Hemoglobin: 10.9 g/dL — ABNORMAL LOW (ref 12.0–15.0)
MCH: 29.2 pg (ref 26.0–34.0)
MCHC: 33.7 g/dL (ref 30.0–36.0)
MCV: 86.6 fL (ref 78.0–100.0)
Platelets: 393 10*3/uL (ref 150–400)
RBC: 3.73 MIL/uL — ABNORMAL LOW (ref 3.87–5.11)
RDW: 14.3 % (ref 11.5–15.5)
WBC: 8.1 10*3/uL (ref 4.0–10.5)

## 2012-10-03 LAB — PHOSPHORUS: Phosphorus: 1.9 mg/dL — ABNORMAL LOW (ref 2.3–4.6)

## 2012-10-03 LAB — MAGNESIUM: Magnesium: 2 mg/dL (ref 1.5–2.5)

## 2012-10-03 MED ORDER — INSULIN GLARGINE 100 UNIT/ML ~~LOC~~ SOLN
15.0000 [IU] | Freq: Every day | SUBCUTANEOUS | Status: DC
  Administered 2012-10-03: 15 [IU] via SUBCUTANEOUS
  Filled 2012-10-03 (×2): qty 0.15

## 2012-10-03 MED ORDER — WARFARIN - PHARMACIST DOSING INPATIENT: Freq: Every day | Status: DC

## 2012-10-03 MED ORDER — K PHOS MONO-SOD PHOS DI & MONO 155-852-130 MG PO TABS
250.0000 mg | ORAL_TABLET | Freq: Two times a day (BID) | ORAL | Status: AC
  Administered 2012-10-03 (×2): 250 mg via ORAL
  Filled 2012-10-03 (×2): qty 1

## 2012-10-03 MED ORDER — WARFARIN SODIUM 7.5 MG PO TABS
7.5000 mg | ORAL_TABLET | Freq: Once | ORAL | Status: AC
  Administered 2012-10-03: 7.5 mg via ORAL
  Filled 2012-10-03 (×2): qty 1

## 2012-10-03 NOTE — Progress Notes (Signed)
ANTICOAGULATION CONSULT NOTE - Initial Consult  Pharmacy Consult for Coumadin Indication: History of embolic strokes   Allergies  Allergen Reactions  . Aspirin Other (See Comments)    Cannot take this medication due to kidney disease  . Ibuprofen Other (See Comments)    Kidney disease  . Losartan Swelling    Patient Measurements: Height: 5' 1.02" (155 cm) Weight: 129 lb 10.1 oz (58.8 kg) IBW/kg (Calculated) : 47.86 Heparin Dosing Weight:   Vital Signs: Temp: 97.7 F (36.5 C) (09/16 1504) Temp src: Oral (09/16 0611) BP: 111/72 mmHg (09/16 1504) Pulse Rate: 65 (09/16 1504)  Labs:  Recent Labs  09/30/12 2119  10/01/12 0350 10/01/12 0935 10/01/12 1444 10/01/12 2313 10/02/12 0515 10/03/12 0415  HGB  --   --  11.7*  --   --   --  12.1 10.9*  HCT  --   --  35.2*  --   --   --  35.4* 32.3*  PLT  --   --  431*  --   --   --  428* 393  APTT 25  --   --   --   --   --   --   --   LABPROT 15.2  --   --   --   --   --   --   --   INR 1.23  --   --   --   --   --   --   --   CREATININE  --   < > 1.00 0.77 0.80 0.71 0.66 0.66  TROPONINI  --   < > >20.00* >20.00* 16.23*  --   --   --   < > = values in this interval not displayed.  Estimated Creatinine Clearance: 74.9 ml/min (by C-G formula based on Cr of 0.66).   Medical History: Past Medical History  Diagnosis Date  . Shingles     11/2010  . Cellulitis     of face - 12/2010  . Peripheral vascular disease in diabetes mellitus   . Hyperlipidemia   . Tobacco abuse     0.5 - 1ppd x 21 yrs  . Headache(784.0)   . Renal disorder     glomerulonephritis  . Blood transfusion   . Anxiety   . Depression   . Peripheral vascular disease   . Diabetes mellitus     IDDM Since Age 67  . Embolism - blood clot   . Renal insufficiency     Medications:  Scheduled:  . aspirin  81 mg Oral Daily  . atorvastatin  80 mg Oral q1800  . heparin  5,000 Units Subcutaneous Q8H  . insulin aspart  0-15 Units Subcutaneous TID WC  .  insulin aspart  0-5 Units Subcutaneous QHS  . insulin glargine  15 Units Subcutaneous QHS  . metoprolol tartrate  25 mg Oral BID  . PARoxetine  20 mg Oral Daily  . phosphorus  250 mg Oral BID  . sodium chloride  3 mL Intravenous Q12H  . sodium chloride  3 mL Intravenous Q12H  . Ticagrelor  90 mg Oral BID  . warfarin  7.5 mg Oral ONCE-1800  . Warfarin - Pharmacist Dosing Inpatient   Does not apply q1800    Assessment: 42 yr old female resident of a nursing home pta was on coumadin PTA at a dose of 5 mg daily. However recently she had an high INR reduced with a couple of doses of Vit K. INR  is currently 1.23. Pt has been on prophylactic dose of heparin 5000 units SQ q8hrs.  Goal of Therapy:  INR 2.3   Plan:  Will give one booster dose of coumadin 7.5 mg and try to get INR to move upwards without too big a jump.  Pt's coumadin is given by nursing staff in nursing home.  Minta Balsam 10/03/2012,4:14 PM

## 2012-10-03 NOTE — Progress Notes (Signed)
Hypoglycemic Event  CBG: 57  Treatment: 8 oz of ginger ale plus breakfast  Symptoms: none  Follow-up CBG: Time:0755 CBG Result:134  Possible Reasons for Event: unknown    Comments/MD notified:yes   [name], [name]  Remember to initiate Hypoglycemia Order Set & complete

## 2012-10-03 NOTE — Progress Notes (Signed)
CARDIAC REHAB PHASE I   PRE:  Rate/Rhythm: 65 SR  BP:  Supine: 100/60  Sitting:   Standing:    SaO2: 99 RA  MODE:  Ambulation: 240 ft   POST:  Rate/Rhythm: 72 SR  BP:  Supine:   Sitting: 100/70  Standing:    SaO2: 99 RA 1310-1420 Assisted X 1 and used walker to ambulate. Gait steady with walker. Pt tires easily and took multiple standing rest stops.  No c/o of cp or SOB with walking. VS stable. Pt to recliner after walk with call light in reach. Started MI and stent education with pt. Discussed smoking cessation with pt. She seems very motivated to quit and wants to try cold [place]. Gave pt encouragement to quit. Pt given tips for quitting and coaching contact number. Would recommend Physical Therapy consult to help her with training for her prothesis. We will follow pt tomorrow. Jadore Parsells RN 10/03/2012 2:20 PM

## 2012-10-03 NOTE — Progress Notes (Signed)
Clinical Social Work Department BRIEF PSYCHOSOCIAL ASSESSMENT 10/03/2012  Patient:  [name], [name]     Account Number:  [id number]     Admit date:  09/30/2012  Clinical Social Worker:  Adair Laundry  Date/Time:  10/03/2012 02:45 PM  Referred by:  Physician  Date Referred:  10/03/2012 Referred for  SNF Placement   Other Referral:   Interview type:  Patient Other interview type:    PSYCHOSOCIAL DATA Living Status:  FACILITY Admitted from facility:  [facility] Level of care:  [place] Primary support name:  Krystyl Kieper [phone] Primary support relationship to patient:  PARENT Degree of support available:   Pt has a moderate amount of family support    CURRENT CONCERNS Current Concerns  Post-Acute Placement   Other Concerns:    SOCIAL WORK ASSESSMENT / PLAN CSW informed that pt is from [employer] and original plan was for pt to dc home. However, pt is not wanting SNF. CSW spoke to pt who told CSW she would like to dc to [facility] and was not happy with her care at [facility]. CSW asked pt for other facilities she would consider as a back up. Pt was only open to being faxed out to [facility], [facility], and [facility]. CSW spoke with pt about possibility that these facilities may not have a Medicaid bed opening. Pt continued to say she did not want to consider other facilities at this time.   Assessment/plan status:  Psychosocial Support/Ongoing Assessment of Needs Other assessment/ plan:   Information/referral to community resources:   None needed (pt already aware of SNF facilities in the area)    PATIENT'S/FAMILY'S RESPONSE TO PLAN OF CARE: Pt is agreeable and requesting SNF       [place], [place]

## 2012-10-03 NOTE — Progress Notes (Signed)
eLink Physician-Brief Progress Note Patient Name: KALEYAH LYKES DOB: 06/23/1970 MRN: [id number]  Date of Service  10/03/2012   HPI/Events of Note  Patient with diarrhea - request from nurse to send stool for C Diff  eICU Interventions  Order for C Diff test   Intervention Category Minor Interventions: Clinical assessment - ordering diagnostic tests  Ariell Gunnels 10/03/2012, 3:36 AM

## 2012-10-03 NOTE — Progress Notes (Signed)
TELEMETRY: Reviewed telemetry pt in NSR: Filed Vitals:   10/02/12 1200 10/02/12 1532 10/02/12 2111 10/03/12 0611  BP:  113/51 108/59 100/57  Pulse:  68 78 63  Temp: 98.9 F (37.2 C) 98 F (36.7 C) 98.5 F (36.9 C) 98.1 F (36.7 C)  TempSrc: Oral Oral Oral Oral  Resp:  14 18 18   Weight:      SpO2: 99% 100% 98% 98%    Intake/Output Summary (Last 24 hours) at 10/03/12 1232 Last data filed at 10/03/12 0900  Gross per 24 hour  Intake    260 ml  Output    353 ml  Net    -93 ml    SUBJECTIVE Feels very well today. Denies any chest pain or SOB. Complains of loose stools after she eats. No abdominal pain.  LABS: Basic Metabolic Panel:  Recent Labs  10/01/12 0935  10/02/12 0515 10/03/12 0415  NA 142  < > 138 136  K 3.6  < > 2.9* 3.9  CL 109  < > 104 106  CO2 20  < > 22 21  GLUCOSE 174*  < > 151* 114*  BUN 15  < > 7 7  CREATININE 0.77  < > 0.66 0.66  CALCIUM 8.0*  < > 8.0* 8.1*  MG 1.6  --   --  2.0  PHOS 1.3*  --   --  1.9*  < > = values in this interval not displayed. Liver Function Tests:  Recent Labs  09/30/12 2000 10/01/12 0350  AST 12 68*  ALT 10 18  ALKPHOS 92 79  BILITOT 0.2* 0.1*  PROT 6.3 5.8*  ALBUMIN 2.4* 2.1*   CBC:  Recent Labs  09/30/12 2000  10/02/12 0515 10/03/12 0415  WBC 17.4*  < > 9.7 8.1  NEUTROABS 15.6*  --   --   --   HGB 11.9*  < > 12.1 10.9*  HCT 38.9  < > 35.4* 32.3*  MCV 96.8  < > 85.3 86.6  PLT 510*  < > 428* 393  < > = values in this interval not displayed. Cardiac Enzymes:  Recent Labs  10/01/12 0350 10/01/12 0935 10/01/12 1444  TROPONINI >20.00* >20.00* 16.23*   Radiology/Studies:  Ct Head Wo Contrast  09/30/2012   *RADIOLOGY REPORT*  Clinical Data: Altered mental status  CT HEAD WITHOUT CONTRAST  Technique:  Contiguous axial images were obtained from the base of the skull through the vertex without contrast.  Comparison: Prior MRI from 04/12/2011  Findings: Multiple scattered focal hypodense areas are seen  within the parasagittal right frontal lobe, right caudate nucleus, and posterior right frontal lobe, likely not changed as compared to the prior MRI and CT.  Findings are consistent with remote ischemic infarcts.  No definite new ischemic infarct identified.  There is no acute intracranial hemorrhage. No midline shift or mass lesion.  The CSF containing spaces are within normal limits.  There is no hydrocephalous.  No extra-axial fluid collection.  Calvarium is intact.  Orbital soft tissues are normal.  Paranasal sinuses and mastoid air cells are clear.  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  No definite new ischemic infarct or other acute intracranial process identified.   Original Report Authenticated By: Jeannine Boga, M.D.   Dg Chest Portable 1 View  09/30/2012   CLINICAL DATA:  Chest pain, altered mental status  EXAM: PORTABLE CHEST - 1 VIEW  COMPARISON:  None.  FINDINGS: The heart size and mediastinal contours are within normal  limits. No acute infiltrate or pulmonary edema. The visualized skeletal structures are unremarkable.  IMPRESSION: No active disease.   Electronically Signed   By: Lahoma Crocker   On: 09/30/2012 19:53   Ecg: NSR. LAD, Anteroseptal infarct. ST elevation resolved. T wave inversion V1-5. Q waves V1-3.   Echo:Study Conclusions  - Left ventricle: Technically difficult study. The cavity size was normal. Wall thickness was increased in a pattern of moderate LVH. Systolic function was mildly to moderately reduced. The estimated ejection fraction was in the range of 40% to 45%. There was an increased relative contribution of atrial contraction to ventricular filling. Doppler parameters are consistent with abnormal left ventricular relaxation (grade 1 diastolic dysfunction). Doppler parameters are consistent with high ventricular filling pressure. - Regional wall motion abnormality: Severe hypokinesis of the mid anterior, apical septal, and  apical myocardium; hypokinesis of the basal anteroseptal myocardium; moderate hypokinesis of the apical anterior, mid anteroseptal, mid inferoseptal, and apical inferior myocardium. - Mitral valve: Calcified annulus. Mildly thickened leaflets . Mild posterior leaflet prolapse. Trivial regurgitation. - Left atrium: The atrium was mildly dilated.   PHYSICAL EXAM General: Well developed, well nourished, in no acute distress. Head: Normal Neck: Negative for carotid bruits. JVD not elevated. Lungs: Clear bilaterally to auscultation without wheezes, rales, or rhonchi. Breathing is unlabored. Heart: RRR S1 S2 without murmurs, rubs, or gallops.  Abdomen: Soft, non-tender, non-distended with normoactive bowel sounds. No hepatomegaly. No rebound/guarding. No obvious abdominal masses. Msk:  No erythema or bruising right thigh. Feet warm. S/p L BKA. Extremities: No clubbing, cyanosis or edema.  Distal pedal pulses are 2+ and equal bilaterally. Neuro: Alert and oriented X 3. Moves all extremities spontaneously. Psych:  Responds to questions appropriately with a normal affect.  ASSESSMENT AND PLAN: 1. Anterior STEMI- late presentation. S/p stents DES to proximal to mid LAD. No chest pain. Continue dual antiplatelet therapy with ASA and Brilinta. ASA is listed as "Allergy" due to CKD. No actual ASA allergy noted. No indication for long term anticoagulation with coumadin. This was started when she had embolus to LE and CVAs. No embolic source found. No history of atrial fibrillation. Neuro previously recommended antiplatelet Rx. I will not resume coumadin. Continue metoprolol. History of "swelling" with ARB so will avoid ACEi or ARB now. 2.DKA- per CCM 3. Hypokalemia- repleted. 4. Acute encephalopathy- resolved.  5. S/p left BKA. 6. Tobacco abuse. Smoking cessation advised. 7. History of CVAs.  8. Loose stools. C. Diff pending. Per primary team.  Plan: progress activity. From a cardiac standpoint she  should be ready to DC tomorrow.  Principal Problem:   DKA (diabetic ketoacidoses) Active Problems:   Diabetes mellitus   Peripheral vascular disease in diabetes mellitus   Hyperlipidemia   Thromboembolism of lower extremity artery   Cerebral embolus with cerebral infarction   Acute encephalopathy   Acute myocardial infarction, unspecified site, initial episode of care   HX: anticoagulation    Signed, Gwyneth Fernandez Martin[place] MD,FACC 10/03/2012 12:32 PM

## 2012-10-03 NOTE — Progress Notes (Signed)
[facility] PROGRESS NOTE  [name] [id number] DOB: July 07, 1970 DOA: 09/30/2012 PCP: Lisa Bogus, MD  Assessment/Plan: 1.STEMI; S/P cardiac catheterization on 10/01/2012. See results below --per cardiology Continue dual antiplatelet therapy with ASA and Brilinta --Patient has preliminary evaluation from cardiac rehabilitation, awaiting final recommendation --Consult placed for physical therapy recommendation:SNF vs home PT(family does not    believe patient would do well at home)  2.  severe diabetic ketoacidosis: Resolved, DM currently controlled on SSI moderate and     Lantus  3. Acute encephalopathy: Most likely due to the severe DKA. CT head completed see results below  -No neurological deficits at this time.   4.  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 She received doses of vitamin K few days ago, for an INR that was greater than 10. --Current INR= 1.23 on 09/30/2012   --Coumadin per pharmacy  5.  history of peripheral vascular disease, status post left BKA .  --Will most likely require SNF PT evaluate  6.  questionable history of rectal bleeding: No active bleeding noted currently. It may have been related to the elevated INR. Continue to monitor closely. CBCs will be checked closely. FOBT.  7. Hypophosphatemia; patient will receive K-Phos x2 doses today recheck from levels in the a.m.  8. Hypoglycemia; patient had one episode of hypoglycemia we'll decrease Lantus by 5 units  9. Nicotine dependence; patient counseled on sequela of continuing to smoke.  Patient requests to try cold [place]     Code Status: Full Family Communication: Family was present for discussion of plan of care Disposition Plan: Most likely to SNF awaiting PT/OT recommendation   Consultants:    Procedures:  Catheterization 10/01/2012 PCI Data:  Vessel - LAD/Segment - proximal to mid  Percent Stenosis  (pre) 100%  TIMI-flow 0  Stent 2.5 x 38 mm and 2.5 by 64mm Promus premier stents  Percent Stenosis (post) 0%  TIMI-flow (post) 3  Final Conclusions:  1. 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 Moderate disease at the ostium of the ramus intermediate branch.  2. Mild to moderate left ventricular dysfunction.  3. Successful stenting of the Roxanol to mid LAD with drug-eluting stents.  NOTE; per cardiology Continue dual antiplatelet therapy with ASA and Brilinta   CT head without contrast 09/30/2012 IMPRESSION:  Multiple scattered remote right MCA territory infarcts. No  definite new ischemic infarct or other acute intracranial process  identified   Antibiotics:    HPI/Subjective: [name] is a 42 y.o. female PMHx DM Type 2, PVD,  Thromboembolism Lower Extremity Artery, on Chronic anticoagulation for Hx Embolic strokes and Arterial thrombosis. Patient is very confused at this time, and unable to provide any history. She's accompanied by her brother and the nursing home director, where she resides. Apparently, the last few days patient has been having hallucinations. She's had multiple episodes of nausea and vomiting without any blood in the emesis. She apparently had a few episodes of rectal bleeding. She had elevated PT/INR, for which she was given vitamin K a few days ago, and was started on ciprofloxacin for possible UTI recently. She has received Zofran and Phenergan in the nursing facility with only partial relief. It was reported by the director of the SNF that the patient inadvertently received a bolus of D5 normal saline a couple days ago. Today she was complaining of chest pain, and appeared to  be more confused and so, she was sent over to the emergency department for further evaluation.   EKGs from 2013 reveal no evidence of Q waves in the inferior leads. There is decreased anterior R wave progression in V1 to V3. EKGs this admission  reveal large inferior Q waves. There is some anterior ST elevation in addition to the old decreased anterior R wave progression. Her troponin this admission has gone from slight elevation up to 20.  TODAY; negative CP, negative SOB, negative concerns  Objective: Filed Vitals:   10/02/12 1200 10/02/12 1532 10/02/12 2111 10/03/12 0611  BP:  113/51 108/59 100/57  Pulse:  68 78 63  Temp: 98.9 F (37.2 C) 98 F (36.7 C) 98.5 F (36.9 C) 98.1 F (36.7 C)  TempSrc: Oral Oral Oral Oral  Resp:  14 18 18   Weight:      SpO2: 99% 100% 98% 98%    Intake/Output Summary (Last 24 hours) at 10/03/12 0929 Last data filed at 10/03/12 0328  Gross per 24 hour  Intake      0 ml  Output    903 ml  Net   -903 ml   Filed Weights   10/01/12 1017  Weight: 58.8 kg (129 lb 10.1 oz)    Exam:   General: Alert and oriented x4,,NAD  Cardiovascular: Regular rhythm and rate, negative murmurs rubs gallops, DP/PT +1 on the right leg  Respiratory: Good auscultation bilateral  Abdomen: Soft, nontender, nondistended, plus bowel sounds  Musculoskeletal: Left BKA present, incision lines are healing well negative sign of infection, right lower extremity negative pedal edema   Data Reviewed: Basic Metabolic Panel:  Recent Labs Lab 10/01/12 0350 10/01/12 0935 10/01/12 1444 10/01/12 2313 10/02/12 0515 10/03/12 0415  NA 144 142 137 136 138 136  K 3.6 3.6 3.1* 3.2* 2.9* 3.9  CL 109 109 105 102 104 106  CO2 17* 20 22 20 22 21   GLUCOSE 159* 174* 141* 226* 151* 114*  BUN 19 15 12 8 7 7   CREATININE 1.00 0.77 0.80 0.71 0.66 0.66  CALCIUM 8.3* 8.0* 7.7* 7.7* 8.0* 8.1*  MG  --  1.6  --   --   --  2.0  PHOS  --  1.3*  --   --   --  1.9*   Liver Function Tests:  Recent Labs Lab 09/30/12 2000 10/01/12 0350  AST 12 68*  ALT 10 18  ALKPHOS 92 79  BILITOT 0.2* 0.1*  PROT 6.3 5.8*  ALBUMIN 2.4* 2.1*   No results found for this basename: LIPASE, AMYLASE,  in the last 168 hours  Recent Labs Lab  09/30/12 2001  AMMONIA 49   CBC:  Recent Labs Lab 09/30/12 2000 10/01/12 0350 10/02/12 0515 10/03/12 0415  WBC 17.4* 11.0* 9.7 8.1  NEUTROABS 15.6*  --   --   --   HGB 11.9* 11.7* 12.1 10.9*  HCT 38.9 35.2* 35.4* 32.3*  MCV 96.8 87.6 85.3 86.6  PLT 510* 431* 428* 393   Cardiac Enzymes:  Recent Labs Lab 09/30/12 2000 09/30/12 2303 10/01/12 0350 10/01/12 0935 10/01/12 1444  TROPONINI 0.33* 7.24* >20.00* >20.00* 16.23*   BNP (last 3 results) No results found for this basename: PROBNP,  in the last 8760 hours CBG:  Recent Labs Lab 10/02/12 1207 10/02/12 1632 10/02/12 2056 10/03/12 0742 10/03/12 0851  GLUCAP 140* 305* 105* 57* 134*    Recent Results (from the past 240 hour(s))  MRSA PCR SCREENING     Status: None  Collection Time    10/01/12  2:14 AM      Result Value Range Status   MRSA by PCR NEGATIVE  NEGATIVE Final   Comment:            The GeneXpert MRSA Assay (FDA     approved for NASAL specimens     only), is one component of a     comprehensive MRSA colonization     surveillance program. It is not     intended to diagnose MRSA     infection nor to guide or     monitor treatment for     MRSA infections.  CULTURE, BLOOD (ROUTINE X 2)     Status: None   Collection Time    10/01/12  4:25 AM      Result Value Range Status   Specimen Description BLOOD RIGHT HAND   Final   Special Requests     Final   Value: BOTTLES DRAWN AEROBIC AND ANAEROBIC 10CC BLUE 5CC RED   Culture  Setup Time     Final   Value: 10/01/2012 14:58     Performed at [facility]   Culture     Final   Value:        BLOOD CULTURE RECEIVED NO GROWTH TO DATE CULTURE WILL BE HELD FOR 5 DAYS BEFORE ISSUING A FINAL NEGATIVE REPORT     Performed at [facility]   Report Status PENDING   Incomplete  CULTURE, BLOOD (ROUTINE X 2)     Status: None   Collection Time    10/01/12  4:35 AM      Result Value Range Status   Specimen Description BLOOD LEFT HAND   Final    Special Requests     Final   Value: BOTTLES DRAWN AEROBIC AND ANAEROBIC 10CC BLUE 5CC RED   Culture  Setup Time     Final   Value: 10/01/2012 14:58     Performed at [facility]   Culture     Final   Value:        BLOOD CULTURE RECEIVED NO GROWTH TO DATE CULTURE WILL BE HELD FOR 5 DAYS BEFORE ISSUING A FINAL NEGATIVE REPORT     Performed at [facility]   Report Status PENDING   Incomplete  URINE CULTURE     Status: None   Collection Time    10/01/12  5:02 AM      Result Value Range Status   Specimen Description URINE, CATHETERIZED   Final   Special Requests NONE   Final   Culture  Setup Time     Final   Value: 10/01/2012 05:53     Performed at [facility]     Final   Value: NO GROWTH     Performed at [facility]   Culture     Final   Value: NO GROWTH     Performed at [facility]   Report Status 10/02/2012 FINAL   Final     Studies: No results found.  Scheduled Meds: . aspirin  81 mg Oral Daily  . atorvastatin  80 mg Oral q1800  . heparin  5,000 Units Subcutaneous Q8H  . insulin aspart  0-15 Units Subcutaneous TID WC  . insulin aspart  0-5 Units Subcutaneous QHS  . insulin glargine  20 Units Subcutaneous QHS  . metoprolol tartrate  25 mg Oral BID  . PARoxetine  20 mg Oral  Daily  . sodium chloride  3 mL Intravenous Q12H  . sodium chloride  3 mL Intravenous Q12H  . Ticagrelor  90 mg Oral BID   Continuous Infusions:   Principal Problem:   DKA (diabetic ketoacidoses) Active Problems:   Diabetes mellitus   Peripheral vascular disease in diabetes mellitus   Thromboembolism of lower extremity artery   Cerebral embolus with cerebral infarction   Acute encephalopathy   Acute myocardial infarction, unspecified site, initial episode of care   HX: anticoagulation    Time spent: 40 minutes    Agusta Hackenberg, J  [facility] Pager [phone]. If 7PM-7AM, please contact night-coverage at [email or web address],  password [facility] 10/03/2012, 9:29 AM  LOS: 3 days

## 2012-10-03 NOTE — Progress Notes (Signed)
10/03/12 Note CBG of 57 this morning before breakfast.  Please consider slight decrease in Lantus dose from 20 units to 18 units.  Thank you.  Alfard Cochrane S. Marcelline Mates, RN, CNS, CDE Inpatient Diabetes Program, team pager [phone]

## 2012-10-03 NOTE — Progress Notes (Addendum)
Clinical Social Work Department CLINICAL SOCIAL WORK PLACEMENT NOTE 10/03/2012  Patient:  [name], [name]  Account Number:  [id number] Admit date:  09/30/2012  Clinical Social Worker:  Berton Mount, Latanya Presser  Date/time:  10/03/2012 02:45 PM  Clinical Social Work is seeking post-discharge placement for this patient at the following level of care:   [place]   (*CSW will update this form in Epic as items are completed)   10/03/2012  Patient/family provided with [place] Department of Clinical Social Work's list of facilities offering this level of care within the geographic area requested by the patient (or if unable, by the patient's family).  10/03/2012  Patient/family informed of their freedom to choose among providers that offer the needed level of care, that participate in Medicare, Medicaid or managed care program needed by the patient, have an available bed and are willing to accept the patient.  10/03/2012  Patient/family informed of [facility]' ownership interest in [facility], as well as of the fact that they are under no obligation to receive care at this facility.  PASARR submitted to EDS on  PASARR number received from [place] on  Existing   FL2 transmitted to all facilities in geographic area requested by pt/family on  10/03/2012 FL2 transmitted to all facilities within larger geographic area on   Patient informed that his/her managed care company has contracts with or will negotiate with  certain facilities, including the following:     Patient/family informed of bed offers received: 10/04/2012  Patient chooses bed at -- Physician recommends and patient chooses bed at    Patient to be transferred to [facility] on  10/04/2012 Patient to be transferred to facility by   The following physician request were entered in Epic:   Additional Comments:

## 2012-10-03 NOTE — Progress Notes (Signed)
Pt has been having some loose stool, Dr Deterding E. Notified, order received to send stool sample down to test for C'diff. ------Krystelle Prashad, rn

## 2012-10-04 DIAGNOSIS — E785 Hyperlipidemia, unspecified: Secondary | ICD-10-CM

## 2012-10-04 LAB — CBC
HCT: 31.3 % — ABNORMAL LOW (ref 36.0–46.0)
Hemoglobin: 10.5 g/dL — ABNORMAL LOW (ref 12.0–15.0)
MCH: 29.2 pg (ref 26.0–34.0)
MCHC: 33.5 g/dL (ref 30.0–36.0)
MCV: 87.2 fL (ref 78.0–100.0)
Platelets: 386 10*3/uL (ref 150–400)
RBC: 3.59 MIL/uL — ABNORMAL LOW (ref 3.87–5.11)
RDW: 14.5 % (ref 11.5–15.5)
WBC: 6.6 10*3/uL (ref 4.0–10.5)

## 2012-10-04 LAB — PROTIME-INR
INR: 0.87 (ref 0.00–1.49)
Prothrombin Time: 11.7 seconds (ref 11.6–15.2)

## 2012-10-04 LAB — GLUCOSE, CAPILLARY
Glucose-Capillary: 103 mg/dL — ABNORMAL HIGH (ref 70–99)
Glucose-Capillary: 132 mg/dL — ABNORMAL HIGH (ref 70–99)
Glucose-Capillary: 140 mg/dL — ABNORMAL HIGH (ref 70–99)

## 2012-10-04 LAB — CLOSTRIDIUM DIFFICILE BY PCR: Toxigenic C. Difficile by PCR: NEGATIVE

## 2012-10-04 MED ORDER — ASPIRIN 81 MG PO CHEW: 81.0000 mg | CHEWABLE_TABLET | Freq: Every day | ORAL | Status: DC

## 2012-10-04 MED ORDER — ATORVASTATIN CALCIUM 80 MG PO TABS: 80.0000 mg | ORAL_TABLET | Freq: Every day | ORAL | Status: DC

## 2012-10-04 MED ORDER — PROMETHAZINE HCL 12.5 MG PO TABS
12.5000 mg | ORAL_TABLET | Freq: Four times a day (QID) | ORAL | Status: DC | PRN
  Administered 2012-10-04: 12.5 mg via ORAL
  Filled 2012-10-04: qty 1

## 2012-10-04 MED ORDER — METOPROLOL TARTRATE 25 MG PO TABS: 25.0000 mg | ORAL_TABLET | Freq: Two times a day (BID) | ORAL | Status: DC

## 2012-10-04 MED ORDER — INSULIN GLARGINE 100 UNIT/ML SOLOSTAR PEN: 15.0000 [IU] | PEN_INJECTOR | Freq: Every day | SUBCUTANEOUS | Status: DC

## 2012-10-04 MED ORDER — OXYCODONE-ACETAMINOPHEN 5-325 MG PO TABS
1.0000 | ORAL_TABLET | Freq: Four times a day (QID) | ORAL | Status: DC | PRN
  Administered 2012-10-04: 2 via ORAL
  Filled 2012-10-04: qty 2

## 2012-10-04 NOTE — Progress Notes (Signed)
1030-1100 Cardiac Rehab Completed discharge education with pt. She voices understanding. She is not appro for Outtp. CRP at present. Rodney Langton RN

## 2012-10-04 NOTE — Progress Notes (Signed)
Discussed discharge instructions with patient, including medications ordered, prescriptions, and follow up appointments.  Discussed pathophysiology of myocardial infarction and Diabetes as it relates to cardiovascular disease.  Understanding of materials furnished verified utilizing "teach back" method.

## 2012-10-04 NOTE — Care Management (Signed)
Late Entry: Pt did call [facility] and they will see pt for therapy on Tuesday October 10, 2012 at 11:00 am for evaluation. CM will call pt at home with time of visit. Bethena Roys, RN,BSN [phone].

## 2012-10-04 NOTE — Care Management (Signed)
Case Manager was asked to call in prior authorization for medication brilinta. Spoke to [employer] with medicaid and reference # for prior authorization is [id number]. No further needs from CM at this time. Bethena Roys, RN,BSN [phone]

## 2012-10-04 NOTE — Care Management Note (Signed)
Case Manager did call [facility] to see if they can approve some therapy visits. Per pt daughter will be able to take pt in the am. If daughter not available [facility] [phone] and [facility] [place]. CM still awaiting call back from [facility]. Bethena Roys, RN,BSN [phone]

## 2012-10-04 NOTE — Progress Notes (Signed)
Patient discharged to private residence via private vehicle accompanied by daughter.  Escorted to exit via wheelchair by nurse tech.

## 2012-10-04 NOTE — Progress Notes (Signed)
Patient asked to have her pain medication changed from fentanyl to her home medication percocet.  Patient stated she has to take nausea medication with the percocet.  MD notified.

## 2012-10-04 NOTE — Progress Notes (Signed)
Physical Therapy Progress Note Patient Details Name: [name] MRN: [id number] DOB: September 26, 1970 Today's Date: 10/04/2012 Time: 11:42am  Discussed PT d/c recommendations with CSW and RNCM and pt will not receive adequate PT in [facility] venue due to limitations in Medicaid coverage in [place].  Pt refusing to return to SNF.  Best option at this point is to pursue OPPT for continued rehab.  OP has same Medicaid limitations but greater likelihood for pt to receive timely services.  RNCM updated on PT recommendation/support for plan to return home and receive OPPT.  Thank you.     [facility] 10/04/2012, 11:42 AM

## 2012-10-04 NOTE — Discharge Summary (Signed)
Physician Discharge Summary  [name] [id number] DOB: 02-Jul-1970 DOA: 09/30/2012  PCP: Alonza Bogus, MD  Admit date: 09/30/2012 Discharge date: 10/04/2012  Time spent: 30 minutes  Recommendations for Outpatient Follow-up:  1. Follow up with PCP in 1-2 weeks 2. Follow up with Dr. Martinique at [place] office as scheduled 3. Stop coumadin per Cardiology recs (see below)  Discharge Diagnoses:  Principal Problem:   DKA (diabetic ketoacidoses) Active Problems:   Diabetes mellitus   Peripheral vascular disease in diabetes mellitus   Hyperlipidemia   Thromboembolism of lower extremity artery   Cerebral embolus with cerebral infarction   Acute encephalopathy   Acute myocardial infarction, unspecified site, initial episode of care   HX: anticoagulation   Discharge Condition: Stable  Diet recommendation: Cardiac  Filed Weights   10/01/12 1017 10/04/12 0818  Weight: 58.8 kg (129 lb 10.1 oz) 59.7 kg (131 lb 9.8 oz)    History of present illness:  42 years old female resident of a SNF with PMH relevant for DM type 1, PVD, CKD, CVA on coumadin and left BKA. Presents to [facility] with AMS, nausea and vomiting. on DKA. As per the history there is questionable history of rectal bleeding. She was recently on ciprofloxacin for UTI and received Vit K for supratherapeutic INR. Apparently the patient was complaining of chest pain but she denied having any CP to me. At [facility] she was found to be in severe DKA with pH of 6.9. DKA protocol was started and she received 2 amps of bicarb. A second troponin showed an increase from 0.33 to 7.24 with no EKG changes or active chest pain. Transfer to [facility] was requested for Cardiology evaluation. At the time of my exam the patient is lethargic but arousable. Denies CP, SOB, nausea, vomiting, abdominal pain or urinary symptoms. She is in no distress and hemodynamically stable. Saturating 99% on RA.   Hospital Course:  1.STEMI; S/P cardiac  catheterization on 10/01/2012 s/p DES to prox-mid LAD  --per cardiology Continue dual antiplatelet therapy with ASA and Brilinta  2. severe diabetic ketoacidosis: Resolved, DM currently controlled on SSI moderate and Lantus 3. Acute encephalopathy: Most likely due to the severe DKA. -No further neurological deficits through the remainder of the hospital course 4. 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 She received doses of vitamin K for an INR that was greater than 10. The case was discussed with Cardiology. Since the patient is now on dual antiplatelet therapy and has no DVT, recommendations were for stopping coumadin   5. history of peripheral vascular disease, status post left BKA .  --Pt was recommended SNF, however, pt desires to go home 6. questionable history of rectal bleeding: No active bleeding noted currently through this hospitalization. It may have been related to the elevated INR. Per above, coumadin has been stopped per Cardiology recs. 7. Hypophosphatemia; patient will received K-Phos replacement  8. Hypoglycemia; patient had one episode of hypoglycemia during this hospital course. Lantus was decreased by 5 units, currently on 15 units. 9. Nicotine dependence; patient counseled on sequela of continuing to smoke. Patient states she will officially quit now. Pt congratulated.  Consultations:  Cardiology  Critical Care  Discharge Exam: Filed Vitals:   10/03/12 1504 10/03/12 1952 10/03/12 2133 10/04/12 0818  BP: 111/72 118/57 108/51   Pulse: 65 74 67   Temp: 97.7 F (36.5 C) 98 F (36.7 C)    TempSrc:  Resp: 15 18    Height: 5' 1.02" (1.55 m)     Weight:    59.7 kg (131 lb 9.8 oz)  SpO2: 97% 99%     General: Awake, in nad Cardiovascular: regular, s1, s2 Respiratory: normal resp effort, s1, s2 Ext: s/p L BKA  Discharge Instructions       Future Appointments Provider Department Dept Phone    10/18/2012 2:10 PM Lendon Colonel, NP [facility] [place] [phone]       Medication List    STOP taking these medications       ciprofloxacin 500 MG tablet  Commonly known as:  CIPRO     fluconazole 150 MG tablet  Commonly known as:  DIFLUCAN     sodium chloride 0.45 %     warfarin 5 MG tablet  Commonly known as:  COUMADIN      TAKE these medications       ALPRAZolam 1 MG tablet  Commonly known as:  XANAX  Take 1 mg by mouth 3 (three) times daily as needed for anxiety.     aspirin 81 MG chewable tablet  Chew 1 tablet (81 mg total) by mouth daily.     atorvastatin 80 MG tablet  Commonly known as:  LIPITOR  Take 1 tablet (80 mg total) by mouth daily at 6 PM.     Insulin Glargine 100 UNIT/ML Sopn  Commonly known as:  LANTUS SOLOSTAR  Inject 15 Units into the skin at bedtime.     metoprolol tartrate 25 MG tablet  Commonly known as:  LOPRESSOR  Take 1 tablet (25 mg total) by mouth 2 (two) times daily.     NOVOLOG FLEXPEN 100 UNIT/ML injection  Generic drug:  insulin aspart  - Inject 2-10 Units into the skin 3 (three) times daily before meals. Sliding scale as follows  - 200-250=2 units  - 251-300=4 units  - 301-350=6 units  - 351-400=8 units  - >400-give 10 units and Notify MD     omeprazole 20 MG capsule  Commonly known as:  PRILOSEC  Take 20 mg by mouth daily.     ondansetron 8 MG tablet  Commonly known as:  ZOFRAN  Take 8 mg by mouth every 8 (eight) hours as needed for nausea.     oxyCODONE-acetaminophen 10-325 MG per tablet  Commonly known as:  PERCOCET  Take 1 tablet by mouth 4 (four) times daily.     PARoxetine 20 MG tablet  Commonly known as:  PAXIL  Take 20 mg by mouth daily.     promethazine 25 MG tablet  Commonly known as:  PHENERGAN  Take 25 mg by mouth every 6 (six) hours as needed for nausea.     sennosides-docusate sodium 8.6-50 MG tablet  Commonly known as:  SENOKOT-S  Take 1 tablet by mouth 2 (two) times  daily.       Allergies  Allergen Reactions  . Aspirin Other (See Comments)    Cannot take this medication due to kidney disease  . Ibuprofen Other (See Comments)    Kidney disease  . Losartan Swelling   Follow-up Information   Follow up with HAWKINS,EDWARD L, MD In 1 week.   Specialty:  Pulmonary Disease   Contact information:   [place] [address] [phone]       Follow up with [facility]. (You will be contacted to schedule and appointment)    Contact information:   [place]  [place] [id number]        The results of significant diagnostics from this hospitalization (including imaging, microbiology, ancillary and laboratory) are listed below for reference.    Significant Diagnostic Studies: Ct Head Wo Contrast  09/30/2012   *RADIOLOGY REPORT*  Clinical Data: Altered mental status  CT HEAD WITHOUT CONTRAST  Technique:  Contiguous axial images were obtained from the base of the skull through the vertex without contrast.  Comparison: Prior MRI from 04/12/2011  Findings: Multiple scattered focal hypodense areas are seen within the parasagittal right frontal lobe, right caudate nucleus, and posterior right frontal lobe, likely not changed as compared to the prior MRI and CT.  Findings are consistent with remote ischemic infarcts.  No definite new ischemic infarct identified.  There is no acute intracranial hemorrhage. No midline shift or mass lesion.  The CSF containing spaces are within normal limits.  There is no hydrocephalous.  No extra-axial fluid collection.  Calvarium is intact.  Orbital soft tissues are normal.  Paranasal sinuses and mastoid air cells are clear.  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  No definite new ischemic infarct or other acute intracranial process identified.   Original Report Authenticated By: Jeannine Boga, M.D.   Dg Chest Portable 1 View  09/30/2012    CLINICAL DATA:  Chest pain, altered mental status  EXAM: PORTABLE CHEST - 1 VIEW  COMPARISON:  None.  FINDINGS: The heart size and mediastinal contours are within normal limits. No acute infiltrate or pulmonary edema. The visualized skeletal structures are unremarkable.  IMPRESSION: No active disease.   Electronically Signed   By: Lahoma Crocker   On: 09/30/2012 19:53    Microbiology: Recent Results (from the past 240 hour(s))  MRSA PCR SCREENING     Status: None   Collection Time    10/01/12  2:14 AM      Result Value Range Status   MRSA by PCR NEGATIVE  NEGATIVE Final   Comment:            The GeneXpert MRSA Assay (FDA     approved for NASAL specimens     only), is one component of a     comprehensive MRSA colonization     surveillance program. It is not     intended to diagnose MRSA     infection nor to guide or     monitor treatment for     MRSA infections.  CULTURE, BLOOD (ROUTINE X 2)     Status: None   Collection Time    10/01/12  4:25 AM      Result Value Range Status   Specimen Description BLOOD RIGHT HAND   Final   Special Requests     Final   Value: BOTTLES DRAWN AEROBIC AND ANAEROBIC 10CC BLUE 5CC RED   Culture  Setup Time     Final   Value: 10/01/2012 14:58     Performed at [facility]   Culture     Final   Value:        BLOOD CULTURE RECEIVED NO GROWTH TO DATE CULTURE WILL BE HELD FOR 5 DAYS BEFORE ISSUING A FINAL NEGATIVE REPORT     Performed at [facility]   Report Status PENDING   Incomplete  CULTURE, BLOOD (ROUTINE X 2)     Status: None   Collection Time    10/01/12  4:35 AM      Result Value Range Status   Specimen Description BLOOD  LEFT HAND   Final   Special Requests     Final   Value: BOTTLES DRAWN AEROBIC AND ANAEROBIC 10CC BLUE 5CC RED   Culture  Setup Time     Final   Value: 10/01/2012 14:58     Performed at [facility]   Culture     Final   Value:        BLOOD CULTURE RECEIVED NO GROWTH TO DATE CULTURE WILL BE HELD FOR 5  DAYS BEFORE ISSUING A FINAL NEGATIVE REPORT     Performed at [facility]   Report Status PENDING   Incomplete  URINE CULTURE     Status: None   Collection Time    10/01/12  5:02 AM      Result Value Range Status   Specimen Description URINE, CATHETERIZED   Final   Special Requests NONE   Final   Culture  Setup Time     Final   Value: 10/01/2012 05:53     Performed at [facility]     Final   Value: NO GROWTH     Performed at [facility]   Culture     Final   Value: NO GROWTH     Performed at [facility]   Report Status 10/02/2012 FINAL   Final  CLOSTRIDIUM DIFFICILE BY PCR     Status: None   Collection Time    10/03/12  6:25 PM      Result Value Range Status   C difficile by pcr NEGATIVE  NEGATIVE Final     Labs: Basic Metabolic Panel:  Recent Labs Lab 10/01/12 0350 10/01/12 0935 10/01/12 1444 10/01/12 2313 10/02/12 0515 10/03/12 0415  NA 144 142 137 136 138 136  K 3.6 3.6 3.1* 3.2* 2.9* 3.9  CL 109 109 105 102 104 106  CO2 17* 20 22 20 22 21   GLUCOSE 159* 174* 141* 226* 151* 114*  BUN 19 15 12 8 7 7   CREATININE 1.00 0.77 0.80 0.71 0.66 0.66  CALCIUM 8.3* 8.0* 7.7* 7.7* 8.0* 8.1*  MG  --  1.6  --   --   --  2.0  PHOS  --  1.3*  --   --   --  1.9*   Liver Function Tests:  Recent Labs Lab 09/30/12 2000 10/01/12 0350  AST 12 68*  ALT 10 18  ALKPHOS 92 79  BILITOT 0.2* 0.1*  PROT 6.3 5.8*  ALBUMIN 2.4* 2.1*   No results found for this basename: LIPASE, AMYLASE,  in the last 168 hours  Recent Labs Lab 09/30/12 2001  AMMONIA 49   CBC:  Recent Labs Lab 09/30/12 2000 10/01/12 0350 10/02/12 0515 10/03/12 0415 10/04/12 0450  WBC 17.4* 11.0* 9.7 8.1 6.6  NEUTROABS 15.6*  --   --   --   --   HGB 11.9* 11.7* 12.1 10.9* 10.5*  HCT 38.9 35.2* 35.4* 32.3* 31.3*  MCV 96.8 87.6 85.3 86.6 87.2  PLT 510* 431* 428* 393 386   Cardiac Enzymes:  Recent Labs Lab 09/30/12 2000 09/30/12 2303 10/01/12 0350  10/01/12 0935 10/01/12 1444  TROPONINI 0.33* 7.24* >20.00* >20.00* 16.23*   BNP: BNP (last 3 results) No results found for this basename: PROBNP,  in the last 8760 hours CBG:  Recent Labs Lab 10/03/12 1115 10/03/12 1533 10/03/12 2050 10/04/12 0009 10/04/12 0726  GLUCAP 145* 106* 236* 103* 140*    Signed:  Illeana Edick K  [facility] 10/04/2012, 11:05 AM

## 2012-10-04 NOTE — Progress Notes (Signed)
Patient stated she felt like her blood sugar had dropped; said she felt shaky.  CBG 103

## 2012-10-04 NOTE — Evaluation (Signed)
Physical Therapy Evaluation Patient Details Name: [name] MRN: [id number] DOB: November 12, 1970 Today's Date: 10/04/2012 Time: [id number] PT Time Calculation (min): 24 min  PT Assessment / Plan / Recommendation History of Present Illness  42 y.o. female PMHx DM Type 2, PVD, Thromboembolism Lower Extremity Artery, on Chronic anticoagulation for Hx Embolic strokes and Arterial thrombosis. Admit with confusion, found to have STEMI.  Previous left BKA using prosthesis.   Clinical Impression  Pt presents with minimal limitations to functional mobility, able to apply and use prosthesis safely for ambulation with RW.  Recommend pt d/c home with HHPT to continue progression of prosthesis training to transition to less restrictive device. Has sufficient assist at home and verbalizes importance of physical activity to maintain/improve cardiopulmonary health.  No other needs acutely for PT, continue with cardiac rehab for mobility needs and please arrange for HHPT at d/c.  Thank you.    PT Assessment  All further PT needs can be met in the next venue of care    Follow Up Recommendations  Home health PT;Supervision - Intermittent    Does the patient have the potential to tolerate intense rehabilitation      Barriers to Discharge        Equipment Recommendations       Recommendations for Other Services     Frequency      Precautions / Restrictions Precautions Precautions: Fall Precaution Comments: has prosthesis for LEFT BKA, dependent on RW for ambulation Restrictions Weight Bearing Restrictions: No   Pertinent Vitals/Pain no apparent distress        Mobility  Bed Mobility Bed Mobility: Supine to Sit;Sit to Supine Supine to Sit: 7: Independent Sit to Supine: 7: Independent Transfers Transfers: Sit to Stand;Stand to Sit Sit to Stand: 6: Modified independent (Device/Increase time);From bed Stand to Sit: 6: Modified independent (Device/Increase time);To chair/3-in-1;To  bed Ambulation/Gait Ambulation/Gait Assistance: 5: Supervision Ambulation Distance (Feet): 20 Feet Assistive device: Rolling walker Ambulation/Gait Assistance Details: supervision for safety initially, progresses to mod I with RW, desires to be on cane or without need for ambulatory aide Gait Pattern: Step-through pattern Stairs: No Wheelchair Mobility Wheelchair Mobility: No    Exercises     PT Diagnosis: Difficulty walking  PT Problem List: Decreased mobility;Decreased knowledge of use of DME;Cardiopulmonary status limiting activity PT Treatment Interventions:       PT Goals(Current goals can be found in the care plan section) Acute Rehab PT Goals Patient Stated Goal: 'go home-home' PT Goal Formulation: No goals set, d/c therapy  Visit Information  Last PT Received On: 10/04/12 Assistance Needed: +1       Prior [place] expects to be discharged to:: Private residence Living Arrangements: Children Available Help at Discharge: Family Type of Home: [place] Access: [place]: One [place]: Environmental consultant - 2 wheels;Bedside commode;Wheelchair - manual Prior Function Level of Independence: Independent with assistive device(s) Communication Communication: No difficulties    Cognition  Cognition Arousal/Alertness: Awake/alert Behavior During Therapy: WFL for tasks assessed/performed Overall Cognitive Status: Within Functional Limits for tasks assessed    Extremity/Trunk Assessment Upper Extremity Assessment Upper Extremity Assessment: Overall WFL for tasks assessed Lower Extremity Assessment Lower Extremity Assessment: LLE deficits/detail LLE Deficits / Details: s/p bka previous admission   Balance    End of Session PT - End of Session Activity Tolerance: Patient tolerated treatment well Patient left: in bed Nurse Communication: Mobility status  GP     Herbie Drape 10/04/2012, 9:44 AM

## 2012-10-04 NOTE — Progress Notes (Signed)
TELEMETRY: Reviewed telemetry pt in NSR: Filed Vitals:   10/03/12 1504 10/03/12 1952 10/03/12 2133 10/04/12 0818  BP: 111/72 118/57 108/51   Pulse: 65 74 67   Temp: 97.7 F (36.5 C) 98 F (36.7 C)    TempSrc:      Resp: 15 18    Height: 5' 1.02" (1.55 m)     Weight:    131 lb 9.8 oz (59.7 kg)  SpO2: 97% 99%      Intake/Output Summary (Last 24 hours) at 10/04/12 0846 Last data filed at 10/04/12 0800  Gross per 24 hour  Intake   1220 ml  Output      0 ml  Net   1220 ml    SUBJECTIVE Feels very well today. Denies any chest pain or SOB. No further diarrhea.  LABS: Basic Metabolic Panel:  Recent Labs  10/01/12 0935  10/02/12 0515 10/03/12 0415  NA 142  < > 138 136  K 3.6  < > 2.9* 3.9  CL 109  < > 104 106  CO2 20  < > 22 21  GLUCOSE 174*  < > 151* 114*  BUN 15  < > 7 7  CREATININE 0.77  < > 0.66 0.66  CALCIUM 8.0*  < > 8.0* 8.1*  MG 1.6  --   --  2.0  PHOS 1.3*  --   --  1.9*  < > = values in this interval not displayed. Liver Function Tests: No results found for this basename: AST, ALT, ALKPHOS, BILITOT, PROT, ALBUMIN,  in the last 72 hours CBC:  Recent Labs  10/03/12 0415 10/04/12 0450  WBC 8.1 6.6  HGB 10.9* 10.5*  HCT 32.3* 31.3*  MCV 86.6 87.2  PLT 393 386   Cardiac Enzymes:  Recent Labs  10/01/12 0935 10/01/12 1444  TROPONINI >20.00* 16.23*   Radiology/Studies:  Ct Head Wo Contrast  09/30/2012   *RADIOLOGY REPORT*  Clinical Data: Altered mental status  CT HEAD WITHOUT CONTRAST  Technique:  Contiguous axial images were obtained from the base of the skull through the vertex without contrast.  Comparison: Prior MRI from 04/12/2011  Findings: Multiple scattered focal hypodense areas are seen within the parasagittal right frontal lobe, right caudate nucleus, and posterior right frontal lobe, likely not changed as compared to the prior MRI and CT.  Findings are consistent with remote ischemic infarcts.  No definite new ischemic infarct identified.   There is no acute intracranial hemorrhage. No midline shift or mass lesion.  The CSF containing spaces are within normal limits.  There is no hydrocephalous.  No extra-axial fluid collection.  Calvarium is intact.  Orbital soft tissues are normal.  Paranasal sinuses and mastoid air cells are clear.  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  No definite new ischemic infarct or other acute intracranial process identified.   Original Report Authenticated By: Jeannine Boga, M.D.   Dg Chest Portable 1 View  09/30/2012   CLINICAL DATA:  Chest pain, altered mental status  EXAM: PORTABLE CHEST - 1 VIEW  COMPARISON:  None.  FINDINGS: The heart size and mediastinal contours are within normal limits. No acute infiltrate or pulmonary edema. The visualized skeletal structures are unremarkable.  IMPRESSION: No active disease.   Electronically Signed   By: Lahoma Crocker   On: 09/30/2012 19:53   Ecg: NSR. LAD, Anteroseptal infarct. ST elevation resolved. T wave inversion V1-5. Q waves V1-3.   Echo:Study Conclusions  - Left ventricle: Technically difficult study.  The cavity size was normal. Wall thickness was increased in a pattern of moderate LVH. Systolic function was mildly to moderately reduced. The estimated ejection fraction was in the range of 40% to 45%. There was an increased relative contribution of atrial contraction to ventricular filling. Doppler parameters are consistent with abnormal left ventricular relaxation (grade 1 diastolic dysfunction). Doppler parameters are consistent with high ventricular filling pressure. - Regional wall motion abnormality: Severe hypokinesis of the mid anterior, apical septal, and apical myocardium; hypokinesis of the basal anteroseptal myocardium; moderate hypokinesis of the apical anterior, mid anteroseptal, mid inferoseptal, and apical inferior myocardium. - Mitral valve: Calcified annulus. Mildly thickened leaflets . Mild posterior  leaflet prolapse. Trivial regurgitation. - Left atrium: The atrium was mildly dilated.   PHYSICAL EXAM General: Well developed, well nourished, in no acute distress. Head: Normal Neck: Negative for carotid bruits. JVD not elevated. Lungs: Clear bilaterally to auscultation without wheezes, rales, or rhonchi. Breathing is unlabored. Heart: RRR S1 S2 without murmurs, rubs, or gallops.  Abdomen: Soft, non-tender, non-distended with normoactive bowel sounds. No hepatomegaly. No rebound/guarding. No obvious abdominal masses. Msk:  No erythema or bruising right thigh. Feet warm. S/p L BKA. Extremities: No clubbing, cyanosis or edema.  Distal pedal pulses are 2+ and equal bilaterally. Neuro: Alert and oriented X 3. Moves all extremities spontaneously. Psych:  Responds to questions appropriately with a normal affect.  ASSESSMENT AND PLAN: 1. Anterior STEMI- late presentation. S/p stents DES to proximal to mid LAD. No chest pain. Continue dual antiplatelet therapy with ASA and Brilinta. ASA is listed as "Allergy" due to CKD. No actual ASA allergy noted. No indication for long term anticoagulation with coumadin. This was started when she had embolus to LE and CVAs. No embolic source found. No history of atrial fibrillation. Neuro previously recommended antiplatelet Rx. I would not resume coumadin. Continue metoprolol. History of "swelling" with ARB so will avoid ACEi or ARB now. 2.DKA- per CCM 3. Hypokalemia- repleted. 4. Acute encephalopathy- resolved.  5. S/p left BKA. 6. Tobacco abuse. Smoking cessation advised. 7. History of CVAs.  8. Loose stools. C. Diff pending. Per primary team.  Plan: Ok for discharge from a cardiac standpoint. We will arrange follow up in our [place] office.  Principal Problem:   DKA (diabetic ketoacidoses) Active Problems:   Diabetes mellitus   Peripheral vascular disease in diabetes mellitus   Hyperlipidemia   Thromboembolism of lower extremity artery    Cerebral embolus with cerebral infarction   Acute encephalopathy   Acute myocardial infarction, unspecified site, initial episode of care   HX: anticoagulation    Signed, Peter Martinique MD,FACC 10/04/2012 8:46 AM

## 2012-10-04 NOTE — Progress Notes (Signed)
CSW (Clinical Education officer, museum) spoke with pt about possibility of referring out to other facilities as all requested facilities were unable to offer a bed. Pt reported to CSW she wants to go home and lives with her daughter. Pt said she feels safe and will be able to get around on her own. CSW did inform pt that insurance will not cover home health PT. Pt was not pleased with this but still requested to be discharged home where she can be close to her family. CSW informed RN CM and MD. CSW signing off.  [place], [place]

## 2012-10-09 LAB — CULTURE, BLOOD (ROUTINE X 2)
Culture: NO GROWTH
Culture: NO GROWTH

## 2012-10-10 ENCOUNTER — Ambulatory Visit (HOSPITAL_COMMUNITY): Payer: Medicaid Other | Admitting: Physical Therapy

## 2012-10-18 ENCOUNTER — Encounter: Payer: Medicaid Other | Admitting: Adult Health

## 2012-10-27 ENCOUNTER — Encounter: Payer: Medicaid Other | Admitting: Adult Health

## 2012-10-30 ENCOUNTER — Encounter: Payer: Self-pay | Admitting: Adult Health

## 2012-10-30 ENCOUNTER — Ambulatory Visit (INDEPENDENT_AMBULATORY_CARE_PROVIDER_SITE_OTHER): Payer: Medicaid Other | Admitting: Adult Health

## 2012-10-30 VITALS — BP 158/73 | HR 73 | Ht 61.0 in | Wt 123.0 lb

## 2012-10-30 DIAGNOSIS — I219 Acute myocardial infarction, unspecified: Secondary | ICD-10-CM

## 2012-10-30 DIAGNOSIS — E119 Type 2 diabetes mellitus without complications: Secondary | ICD-10-CM

## 2012-10-30 DIAGNOSIS — E785 Hyperlipidemia, unspecified: Secondary | ICD-10-CM

## 2012-10-30 DIAGNOSIS — I10 Essential (primary) hypertension: Secondary | ICD-10-CM

## 2012-10-30 MED ORDER — METOPROLOL TARTRATE 25 MG PO TABS: 25.0000 mg | ORAL_TABLET | Freq: Two times a day (BID) | ORAL | Status: DC

## 2012-10-30 MED ORDER — ATORVASTATIN CALCIUM 80 MG PO TABS: 80.0000 mg | ORAL_TABLET | Freq: Every day | ORAL | Status: DC

## 2012-10-30 MED ORDER — NITROGLYCERIN 0.4 MG SL SUBL: 0.4000 mg | SUBLINGUAL_TABLET | SUBLINGUAL | Status: DC | PRN

## 2012-10-30 NOTE — Assessment & Plan Note (Signed)
Follow up in 3 months with new labs for re-evaluation.

## 2012-10-30 NOTE — Progress Notes (Deleted)
Name: [name]    DOB: [birth year]  Age: 42 y.o.  MR#: [id number]       PCP:  Alonza Bogus, MD      Insurance: Payor: MEDICAID [place] / Plan: MEDICAID [place] ACCESS / Product Type: *No Product type* /   CC:    Chief Complaint  Patient presents with  . Coronary Artery Disease    VS Filed Vitals:   10/30/12 1518  BP: 158/73  Pulse: 73  Height: 5\' 1"  (1.549 m)  Weight: 123 lb (55.792 kg)    Weights Current Weight  10/30/12 123 lb (55.792 kg)  10/04/12 131 lb 9.8 oz (59.7 kg)  10/04/12 131 lb 9.8 oz (59.7 kg)    Blood Pressure  BP Readings from Last 3 Encounters:  10/30/12 158/73  10/03/12 108/51  10/03/12 108/51     Admit date:  (Not on file) Last encounter with RMR:  Visit date not found   Allergy Aspirin; Ibuprofen; and Losartan  Current Outpatient Prescriptions  Medication Sig Dispense Refill  . ALPRAZolam (XANAX) 1 MG tablet Take 1 mg by mouth 3 (three) times daily as needed for anxiety.      Marland Kitchen aspirin 81 MG chewable tablet Chew 1 tablet (81 mg total) by mouth daily.      Marland Kitchen atorvastatin (LIPITOR) 80 MG tablet Take 1 tablet (80 mg total) by mouth daily at 6 PM.  30 tablet  0  . insulin aspart (NOVOLOG FLEXPEN) 100 UNIT/ML injection Inject 2-10 Units into the skin 3 (three) times daily before meals. Sliding scale as follows 200-250=2 units 251-300=4 units 301-350=6 units 351-400=8 units >400-give 10 units and Notify MD      . Insulin Glargine (LANTUS SOLOSTAR) 100 UNIT/ML SOPN Inject 15 Units into the skin at bedtime.      . metoprolol tartrate (LOPRESSOR) 25 MG tablet Take 1 tablet (25 mg total) by mouth 2 (two) times daily.  60 tablet  0  . omeprazole (PRILOSEC) 20 MG capsule Take 20 mg by mouth daily.      . ondansetron (ZOFRAN) 8 MG tablet Take 8 mg by mouth every 8 (eight) hours as needed for nausea.      Marland Kitchen oxyCODONE-acetaminophen (PERCOCET) 10-325 MG per tablet Take 1 tablet by mouth 4 (four) times daily.      Marland Kitchen PARoxetine (PAXIL) 20 MG tablet Take 20 mg  by mouth daily.      . promethazine (PHENERGAN) 25 MG tablet Take 25 mg by mouth every 6 (six) hours as needed for nausea.      . sennosides-docusate sodium (SENOKOT-S) 8.6-50 MG tablet Take 1 tablet by mouth 2 (two) times daily.      . Ticagrelor (BRILINTA) 90 MG TABS tablet Take 90 mg by mouth 2 (two) times daily.      . [DISCONTINUED] losartan (COZAAR) 25 MG tablet Take 12.5 mg by mouth daily.       No current facility-administered medications for this visit.    Discontinued Meds:   There are no discontinued medications.  Patient Active Problem List   Diagnosis Date Noted  . Acute myocardial infarction, unspecified site, initial episode of care 10/01/2012  . HX: anticoagulation 10/01/2012  . DKA (diabetic ketoacidoses) 09/30/2012  . Acute encephalopathy 09/30/2012  . Cerebral embolus with cerebral infarction 04/13/2011  . Delirium 04/13/2011  . Osteomyelitis of foot, left, acute 04/09/2011  . Cellulitis of left foot 02/11/2011  . Thromboembolism of lower extremity artery 02/09/2011  . Abnormal ECG 01/27/2011  . Peripheral  vascular disease in diabetes mellitus   . Hyperlipidemia   . Tobacco abuse   . Diabetes mellitus 01/11/2011    LABS    Component Value Date/Time   NA 136 10/03/2012 0415   NA 138 10/02/2012 0515   NA 136 10/01/2012 2313   K 3.9 10/03/2012 0415   K 2.9* 10/02/2012 0515   K 3.2* 10/01/2012 2313   CL 106 10/03/2012 0415   CL 104 10/02/2012 0515   CL 102 10/01/2012 2313   CO2 21 10/03/2012 0415   CO2 22 10/02/2012 0515   CO2 20 10/01/2012 2313   GLUCOSE 114* 10/03/2012 0415   GLUCOSE 151* 10/02/2012 0515   GLUCOSE 226* 10/01/2012 2313   BUN 7 10/03/2012 0415   BUN 7 10/02/2012 0515   BUN 8 10/01/2012 2313   CREATININE 0.66 10/03/2012 0415   CREATININE 0.66 10/02/2012 0515   CREATININE 0.71 10/01/2012 2313   CALCIUM 8.1* 10/03/2012 0415   CALCIUM 8.0* 10/02/2012 0515   CALCIUM 7.7* 10/01/2012 2313   GFRNONAA >90 10/03/2012 0415   GFRNONAA >90 10/02/2012 0515    GFRNONAA >90 10/01/2012 2313   GFRAA >90 10/03/2012 0415   GFRAA >90 10/02/2012 0515   GFRAA >90 10/01/2012 2313   CMP     Component Value Date/Time   NA 136 10/03/2012 0415   K 3.9 10/03/2012 0415   CL 106 10/03/2012 0415   CO2 21 10/03/2012 0415   GLUCOSE 114* 10/03/2012 0415   BUN 7 10/03/2012 0415   CREATININE 0.66 10/03/2012 0415   CALCIUM 8.1* 10/03/2012 0415   PROT 5.8* 10/01/2012 0350   ALBUMIN 2.1* 10/01/2012 0350   AST 68* 10/01/2012 0350   ALT 18 10/01/2012 0350   ALKPHOS 79 10/01/2012 0350   BILITOT 0.1* 10/01/2012 0350   GFRNONAA >90 10/03/2012 0415   GFRAA >90 10/03/2012 0415       Component Value Date/Time   WBC 6.6 10/04/2012 0450   WBC 8.1 10/03/2012 0415   WBC 9.7 10/02/2012 0515   HGB 10.5* 10/04/2012 0450   HGB 10.9* 10/03/2012 0415   HGB 12.1 10/02/2012 0515   HCT 31.3* 10/04/2012 0450   HCT 32.3* 10/03/2012 0415   HCT 35.4* 10/02/2012 0515   MCV 87.2 10/04/2012 0450   MCV 86.6 10/03/2012 0415   MCV 85.3 10/02/2012 0515    Lipid Panel     Component Value Date/Time   CHOL  Value: 228        ATP III CLASSIFICATION:  <200     mg/dL   Desirable  200-239  mg/dL   Borderline High  >=240    mg/dL   High       * 03/30/2009 0547   TRIG 194* 03/30/2009 0547   HDL 45 03/30/2009 0547   CHOLHDL 5.1 03/30/2009 0547   VLDL 39 03/30/2009 0547   LDLCALC  Value: 144        Total Cholesterol/HDL:CHD Risk Coronary Heart Disease Risk Table                     Men   Women  1/2 Average Risk   3.4   3.3  Average Risk       5.0   4.4  2 X Average Risk   9.6   7.1  3 X Average Risk  23.4   11.0        Use the calculated Patient Ratio above and the CHD Risk Table to determine the patient's CHD Risk.  ATP III CLASSIFICATION (LDL):  <100     mg/dL   Optimal  100-129  mg/dL   Near or Above                    Optimal  130-159  mg/dL   Borderline  160-189  mg/dL   High  >190     mg/dL   Very High* 03/30/2009 0547    ABG    Component Value Date/Time   PHART 6.995* 09/30/2012 2133   PCO2ART 13.0* 09/30/2012  2133   PO2ART 97.9 09/30/2012 2133   HCO3 3.0* 09/30/2012 2133   TCO2 PENDING 09/30/2012 2133   ACIDBASEDEF 26.4* 09/30/2012 2133   O2SAT 94.6 09/30/2012 2133     Lab Results  Component Value Date   TSH 6.633* 04/14/2011   BNP (last 3 results) No results found for this basename: PROBNP,  in the last 8760 hours Cardiac Panel (last 3 results) No results found for this basename: CKTOTAL, CKMB, TROPONINI, RELINDX,  in the last 72 hours  Iron/TIBC/Ferritin No results found for this basename: iron, tibc, ferritin     EKG Orders placed in visit on 10/30/12  . EKG 12-LEAD     Prior Assessment and Plan Problem List as of 10/30/2012     Cardiovascular and Mediastinum   Peripheral vascular disease in diabetes mellitus   Thromboembolism of lower extremity artery   Cerebral embolus with cerebral infarction   Acute myocardial infarction, unspecified site, initial episode of care     Endocrine   Diabetes mellitus   DKA (diabetic ketoacidoses)     Nervous and Auditory   Delirium   Acute encephalopathy     Musculoskeletal and Integument   Osteomyelitis of foot, left, acute     Other   Hyperlipidemia   Tobacco abuse   Abnormal ECG   Cellulitis of left foot   HX: anticoagulation       Imaging: Ct Head Wo Contrast  09/30/2012   *RADIOLOGY REPORT*  Clinical Data: Altered mental status  CT HEAD WITHOUT CONTRAST  Technique:  Contiguous axial images were obtained from the base of the skull through the vertex without contrast.  Comparison: Prior MRI from 04/12/2011  Findings: Multiple scattered focal hypodense areas are seen within the parasagittal right frontal lobe, right caudate nucleus, and posterior right frontal lobe, likely not changed as compared to the prior MRI and CT.  Findings are consistent with remote ischemic infarcts.  No definite new ischemic infarct identified.  There is no acute intracranial hemorrhage. No midline shift or mass lesion.  The CSF containing spaces are within  normal limits.  There is no hydrocephalous.  No extra-axial fluid collection.  Calvarium is intact.  Orbital soft tissues are normal.  Paranasal sinuses and mastoid air cells are clear.  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  No definite new ischemic infarct or other acute intracranial process identified.   Original Report Authenticated By: Jeannine Boga, M.D.   Dg Chest Portable 1 View  09/30/2012   CLINICAL DATA:  Chest pain, altered mental status  EXAM: PORTABLE CHEST - 1 VIEW  COMPARISON:  None.  FINDINGS: The heart size and mediastinal contours are within normal limits. No acute infiltrate or pulmonary edema. The visualized skeletal structures are unremarkable.  IMPRESSION: No active disease.   Electronically Signed   By: Lahoma Crocker   On: 09/30/2012 19:53

## 2012-10-30 NOTE — Patient Instructions (Signed)
Your physician recommends that you schedule a follow-up appointment in: [place]

## 2012-10-30 NOTE — Assessment & Plan Note (Signed)
She is s/p PCI without further symptoms. She is medically complicated with several issues. She is compliant with medications, but not with lifestyle changes. I have counseled her on smoking cessation. She is given refills on metoprolol and statin. Samples of Brilinta are provided. She is strongly advised not to stop Brilinta for any reason. She will follow up in 3 months with Dr. Domenic Polite to be established.

## 2012-10-30 NOTE — Progress Notes (Signed)
HPI: [name] is a 42 year old former patient of Dr. Lattie Haw were following for ongoing assessment and management of coronary artery disease, status post admission to [facility] hospital September 2014 with cardiac catheterization and drug-eluting stent to the proximal mid LAD. The patient was started on aspirin and Brilinta. During that hospitalization the patient also had severe diabetic ketoacidosis, with acute encephalopathy associated the patient was continued on metoprolol, aspirin, atorvastatin, in [place]. But it was discontinued. She had been on this with a history of CVA. She is here for posthospitalization followup.    She comes today with continued musculoskeletal pain, back pain, and anxiety. She is feeling better since stent placement, without shoulder pain. She unfortunately continues to smoke. She is medically compliant.  Allergies  Allergen Reactions  . Aspirin Other (See Comments)    Cannot take this medication due to kidney disease  . Ibuprofen Other (See Comments)    Kidney disease  . Losartan Swelling    Current Outpatient Prescriptions  Medication Sig Dispense Refill  . ALPRAZolam (XANAX) 1 MG tablet Take 1 mg by mouth 3 (three) times daily as needed for anxiety.      Marland Kitchen aspirin 81 MG chewable tablet Chew 1 tablet (81 mg total) by mouth daily.      Marland Kitchen atorvastatin (LIPITOR) 80 MG tablet Take 1 tablet (80 mg total) by mouth daily at 6 PM.  30 tablet  6  . insulin aspart (NOVOLOG FLEXPEN) 100 UNIT/ML injection Inject 2-10 Units into the skin 3 (three) times daily before meals. Sliding scale as follows 200-250=2 units 251-300=4 units 301-350=6 units 351-400=8 units >400-give 10 units and Notify MD      . Insulin Glargine (LANTUS SOLOSTAR) 100 UNIT/ML SOPN Inject 15 Units into the skin at bedtime.      . metoprolol tartrate (LOPRESSOR) 25 MG tablet Take 1 tablet (25 mg total) by mouth 2 (two) times daily.  60 tablet  6  . omeprazole (PRILOSEC) 20 MG capsule Take 20 mg  by mouth daily.      . ondansetron (ZOFRAN) 8 MG tablet Take 8 mg by mouth every 8 (eight) hours as needed for nausea.      Marland Kitchen oxyCODONE-acetaminophen (PERCOCET) 10-325 MG per tablet Take 1 tablet by mouth 4 (four) times daily.      Marland Kitchen PARoxetine (PAXIL) 20 MG tablet Take 20 mg by mouth daily.      . promethazine (PHENERGAN) 25 MG tablet Take 25 mg by mouth every 6 (six) hours as needed for nausea.      . sennosides-docusate sodium (SENOKOT-S) 8.6-50 MG tablet Take 1 tablet by mouth 2 (two) times daily.      . Ticagrelor (BRILINTA) 90 MG TABS tablet Take 90 mg by mouth 2 (two) times daily.      . nitroGLYCERIN (NITROSTAT) 0.4 MG SL tablet Place 1 tablet (0.4 mg total) under the tongue every 5 (five) minutes as needed for chest pain.  25 tablet  5  . [DISCONTINUED] losartan (COZAAR) 25 MG tablet Take 12.5 mg by mouth daily.       No current facility-administered medications for this visit.    Past Medical History  Diagnosis Date  . Shingles     11/2010  . Cellulitis     of face - 12/2010  . Peripheral vascular disease in diabetes mellitus   . Hyperlipidemia   . Tobacco abuse     0.5 - 1ppd x 21 yrs  . Headache(784.0)   . Renal disorder  glomerulonephritis  . Blood transfusion   . Anxiety   . Depression   . Peripheral vascular disease   . Diabetes mellitus     IDDM Since Age 20  . Embolism - blood clot   . Renal insufficiency     Past Surgical History  Procedure Laterality Date  . Wrist surgery      left  . Embolectomy  01/29/2011    Procedure: EMBOLECTOMY;  Surgeon: Mal Misty, MD;  Location[facility] OR;  Service: Vascular;  Laterality: Left;  Left Popliteal and Tibial Embolectomy with patch angioplasty  . Dilation and curettage of uterus    . Multiple tooth extractions    . Amputation  03/03/2011    Procedure: AMPUTATION DIGIT;  Surgeon: Newt Minion, MD;  Location: [place];  Service: Orthopedics;  Laterality: Left;  Left Foot Amputation 4th and 5th toes at MTP joint  . Tee  without cardioversion  04/15/2011    Procedure: TRANSESOPHAGEAL ECHOCARDIOGRAM (TEE);  Surgeon: [name], MD;  Location: [facility];  Service: Cardiovascular;  Laterality: N/A;  . Amputation  04/22/2011    Procedure: AMPUTATION BELOW KNEE;  Surgeon: Newt Minion, MD;  Location: [place];  Service: Orthopedics;  Laterality: Left;  Left Below Knee Amputation    [id number] of systems complete and found to be negative unless listed above  PHYSICAL EXAM BP 158/73  Pulse 73  Ht 5\' 1"  (1.549 m)  Wt 123 lb (55.792 kg)  BMI 23.25 kg/m2  LMP 12/28/2010  General: Well developed, well nourished, in no acute distress Head: Eyes PERRLA, No xanthomas.   Normal cephalic and atramatic  Lungs: Some slight inspiratory wheezes. Smells heavily of smoke.  Heart: HRRR S1 S2, without MRG.  Pulses are 2+ & equal.            No carotid bruit. [name].  No abdominal bruits. No femoral bruits. Abdomen: Bowel sounds are positive, abdomen soft and non-tender without masses or                  Hernia's noted. Msk:  Back normal, normal gait. Normal strength and tone for age. Extremities: No clubbing, cyanosis or edema. Right BKA. DP +1 on left Neuro: Alert and oriented X 3. Psych:  Anxious, depressed  affect, responds appropriately    ASSESSMENT AND PLAN

## 2012-10-30 NOTE — Assessment & Plan Note (Signed)
Continue diabetic management per her PCP.

## 2012-11-27 ENCOUNTER — Telehealth: Payer: Self-pay | Admitting: Adult Health

## 2012-11-27 MED ORDER — PRASUGREL HCL 10 MG PO TABS: 10.0000 mg | ORAL_TABLET | Freq: Every day | ORAL | Status: DC

## 2012-11-27 NOTE — Telephone Encounter (Signed)
Pt goes to [employer]. This nurse can fill out a form to get help, but it takes a while to approve and with only 5 pills left she will be out. Do you recommend something else. We have no samples of brilinta.

## 2012-11-27 NOTE — Telephone Encounter (Signed)
Start Effient 10 mg daily per KL,NP. D/C Brilinta. Left samples and Effient book at front desk for pt to pick up.

## 2012-11-27 NOTE — Telephone Encounter (Signed)
Patient states that she only has 5 more pills of Brilinta left and she can not afford RX. / tgs

## 2012-12-06 ENCOUNTER — Telehealth: Payer: Self-pay | Admitting: *Deleted

## 2012-12-06 NOTE — Telephone Encounter (Signed)
Called medicare [phone] for prior authorization. Pt has been approved for Effient 10 mg Daily till 12-01-2013. Approval  number is [id number]. Faxed the approval back to [place].

## 2012-12-11 ENCOUNTER — Telehealth: Payer: Self-pay | Admitting: *Deleted

## 2012-12-11 DIAGNOSIS — N059 Unspecified nephritic syndrome with unspecified morphologic changes: Secondary | ICD-10-CM

## 2012-12-11 NOTE — Telephone Encounter (Signed)
.  left message to have patient return my call.  

## 2012-12-11 NOTE — Telephone Encounter (Signed)
Pt is concerned about the side effects of Effient and the warnings.

## 2012-12-12 DIAGNOSIS — N059 Unspecified nephritic syndrome with unspecified morphologic changes: Secondary | ICD-10-CM | POA: Insufficient documentation

## 2012-12-12 MED ORDER — CLOPIDOGREL BISULFATE 75 MG PO TABS: 75.0000 mg | ORAL_TABLET | Freq: Every day | ORAL | Status: DC

## 2012-12-12 NOTE — Telephone Encounter (Signed)
Spoke to pt to advise results/instructions. Pt understood. Pt requested the effient be removed from her list, this nurse called [facility] to make sure this nurse spoke to pharm rep Jonni Sanger whom advised he will remove the effient from her list, pt understood to STOP Effient, START Plavix 75mg  once daily and continue her ASA 81mg  once daily pt will call our office with any further concerns as needed

## 2012-12-12 NOTE — Telephone Encounter (Signed)
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 Domenic Polite 02-01-2013, please advise

## 2012-12-12 NOTE — Telephone Encounter (Signed)
This is a former patient Dr. Lattie Haw. I have not seen her in clinic. She had a complex hospital stay including percutaneous intervention, discharge summary reviewed. She was seen recently by Ms. Lawrence NP. My understanding is that she was changed from [place] to Effient due to insurance payment restrictions. She was placed on Effient 10 mg daily by Ms. Lawrence NP. My recommendation would be to stop Effient and place her on Plavix 75 mg daily. She has a prior history of stroke which generally contraindicates use of Effient per black box warning. I will also for this to Dr. Martin[place] who did her intervention with DES to the LAD in September to make sure he has no objection.

## 2012-12-18 NOTE — Telephone Encounter (Signed)
Noted, Thanks

## 2013-01-07 ENCOUNTER — Emergency Department (HOSPITAL_COMMUNITY): Payer: Medicaid Other

## 2013-01-07 ENCOUNTER — Encounter (HOSPITAL_COMMUNITY): Payer: Self-pay | Admitting: Emergency Medicine

## 2013-01-07 ENCOUNTER — Emergency Department (HOSPITAL_COMMUNITY)
Admission: EM | Admit: 2013-01-07 | Discharge: 2013-01-08 | Disposition: A | Discharge status: Home / Self Care | Payer: Medicaid Other | Attending: Emergency Medicine | Admitting: Emergency Medicine

## 2013-01-07 DIAGNOSIS — Z7982 Long term (current) use of aspirin: Secondary | ICD-10-CM | POA: Insufficient documentation

## 2013-01-07 DIAGNOSIS — Z7902 Long term (current) use of antithrombotics/antiplatelets: Secondary | ICD-10-CM | POA: Insufficient documentation

## 2013-01-07 DIAGNOSIS — I252 Old myocardial infarction: Secondary | ICD-10-CM | POA: Insufficient documentation

## 2013-01-07 DIAGNOSIS — F329 Major depressive disorder, single episode, unspecified: Secondary | ICD-10-CM | POA: Insufficient documentation

## 2013-01-07 DIAGNOSIS — E785 Hyperlipidemia, unspecified: Secondary | ICD-10-CM | POA: Insufficient documentation

## 2013-01-07 DIAGNOSIS — Z87891 Personal history of nicotine dependence: Secondary | ICD-10-CM | POA: Insufficient documentation

## 2013-01-07 DIAGNOSIS — F411 Generalized anxiety disorder: Secondary | ICD-10-CM | POA: Insufficient documentation

## 2013-01-07 DIAGNOSIS — R6883 Chills (without fever): Secondary | ICD-10-CM | POA: Insufficient documentation

## 2013-01-07 DIAGNOSIS — M546 Pain in thoracic spine: Secondary | ICD-10-CM | POA: Insufficient documentation

## 2013-01-07 DIAGNOSIS — J069 Acute upper respiratory infection, unspecified: Secondary | ICD-10-CM

## 2013-01-07 DIAGNOSIS — R0789 Other chest pain: Secondary | ICD-10-CM | POA: Insufficient documentation

## 2013-01-07 DIAGNOSIS — Z87448 Personal history of other diseases of urinary system: Secondary | ICD-10-CM | POA: Insufficient documentation

## 2013-01-07 DIAGNOSIS — Z872 Personal history of diseases of the skin and subcutaneous tissue: Secondary | ICD-10-CM | POA: Insufficient documentation

## 2013-01-07 DIAGNOSIS — E1159 Type 2 diabetes mellitus with other circulatory complications: Secondary | ICD-10-CM | POA: Insufficient documentation

## 2013-01-07 DIAGNOSIS — Z8673 Personal history of transient ischemic attack (TIA), and cerebral infarction without residual deficits: Secondary | ICD-10-CM | POA: Insufficient documentation

## 2013-01-07 DIAGNOSIS — Z79899 Other long term (current) drug therapy: Secondary | ICD-10-CM | POA: Insufficient documentation

## 2013-01-07 DIAGNOSIS — Z8619 Personal history of other infectious and parasitic diseases: Secondary | ICD-10-CM | POA: Insufficient documentation

## 2013-01-07 DIAGNOSIS — Z794 Long term (current) use of insulin: Secondary | ICD-10-CM | POA: Insufficient documentation

## 2013-01-07 DIAGNOSIS — I798 Other disorders of arteries, arterioles and capillaries in diseases classified elsewhere: Secondary | ICD-10-CM | POA: Insufficient documentation

## 2013-01-07 DIAGNOSIS — I10 Essential (primary) hypertension: Secondary | ICD-10-CM | POA: Insufficient documentation

## 2013-01-07 DIAGNOSIS — S88119A Complete traumatic amputation at level between knee and ankle, unspecified lower leg, initial encounter: Secondary | ICD-10-CM | POA: Insufficient documentation

## 2013-01-07 DIAGNOSIS — F3289 Other specified depressive episodes: Secondary | ICD-10-CM | POA: Insufficient documentation

## 2013-01-07 DIAGNOSIS — Z86718 Personal history of other venous thrombosis and embolism: Secondary | ICD-10-CM | POA: Insufficient documentation

## 2013-01-07 LAB — BASIC METABOLIC PANEL
BUN: 14 mg/dL (ref 6–23)
CO2: 24 mEq/L (ref 19–32)
Calcium: 9.2 mg/dL (ref 8.4–10.5)
Chloride: 93 mEq/L — ABNORMAL LOW (ref 96–112)
Creatinine, Ser: 1.46 mg/dL — ABNORMAL HIGH (ref 0.50–1.10)
GFR calc Af Amer: 50 mL/min — ABNORMAL LOW (ref >90)
GFR calc non Af Amer: 43 mL/min — ABNORMAL LOW (ref >90)
Glucose, Bld: 473 mg/dL — ABNORMAL HIGH (ref 70–99)
Potassium: 4.1 mEq/L (ref 3.5–5.1)
Sodium: 133 mEq/L — ABNORMAL LOW (ref 135–145)

## 2013-01-07 LAB — CBC WITH DIFFERENTIAL/PLATELET
Basophils Absolute: 0.1 10*3/uL (ref 0.0–0.1)
Basophils Relative: 1 % (ref 0–1)
Eosinophils Absolute: 0.2 10*3/uL (ref 0.0–0.7)
Eosinophils Relative: 3 % (ref 0–5)
HCT: 41.1 % (ref 36.0–46.0)
Hemoglobin: 13.8 g/dL (ref 12.0–15.0)
Lymphocytes Relative: 30 % (ref 12–46)
Lymphs Abs: 2.3 10*3/uL (ref 0.7–4.0)
MCH: 29.5 pg (ref 26.0–34.0)
MCHC: 33.6 g/dL (ref 30.0–36.0)
MCV: 87.8 fL (ref 78.0–100.0)
Monocytes Absolute: 0.5 10*3/uL (ref 0.1–1.0)
Monocytes Relative: 6 % (ref 3–12)
Neutro Abs: 4.6 10*3/uL (ref 1.7–7.7)
Neutrophils Relative %: 60 % (ref 43–77)
Platelets: 454 10*3/uL — ABNORMAL HIGH (ref 150–400)
RBC: 4.68 MIL/uL (ref 3.87–5.11)
RDW: 13.6 % (ref 11.5–15.5)
WBC: 7.7 10*3/uL (ref 4.0–10.5)

## 2013-01-07 LAB — TROPONIN I: Troponin I: <0.3 ng/mL (ref <0.30)

## 2013-01-07 MED ORDER — MORPHINE SULFATE 4 MG/ML IJ SOLN
4.0000 mg | Freq: Once | INTRAMUSCULAR | Status: AC
  Administered 2013-01-07: 4 mg via INTRAVENOUS
  Filled 2013-01-07: qty 1

## 2013-01-07 NOTE — ED Notes (Signed)
Pt c/o left sided, intermittent, throbbing chest pain that started earlier today. Pt had a heart attack in September. Pt also states she also is having flu like symptoms. Chills, nausea, fever, body aches, and congestion x 1wk.

## 2013-01-07 NOTE — ED Notes (Signed)
Pt sleeping. 

## 2013-01-07 NOTE — ED Provider Notes (Signed)
CSN: [id number]     Arrival date & time 01/07/13  2146 History  This chart was scribed for Lisa Speak, MD by [name], ED scribe.  This patient was seen in room APA05/APA05 and the patient's care was started at 10:57 PM.   Chief Complaint  Patient presents with  . Chest Pain    The history is provided by the patient. No language interpreter was used.    HPI Comments: [name] is a 42 y.o. female with h/o MI, stroke, embolism, DM, HTN, and hyperlipidemia who presents to the Emergency Department complaining of right-sided CP that began today described as similar to previous heart attack.  Pt states she has had "flu symptoms" for the past week including congestion, chills, weakness, fatigue, anorexia, generalized body aches, fever, and nausea.  She denies cough or vomiting.  Today she developed CP under the right breast radiating around into her right back and shoulder blade.  Pt had an MI in September 2014 and had stents placed and she describes her current pain as similar.   She took a nitroglycerin 30 minutes prior to evaluation and states this eased her pain somewhat.  She notes that she has had her heart medications changed multiple times over the past several months.  Pt denies recent sick contact.  She did not receive a flu shot this year.    Cardiologist is at [facility]   Past Medical History  Diagnosis Date  . Shingles     11/2010  . Cellulitis     of face - 12/2010  . Peripheral vascular disease in diabetes mellitus   . Hyperlipidemia   . Tobacco abuse     0.5 - 1ppd x 21 yrs  . Headache(784.0)   . Renal disorder     glomerulonephritis  . Blood transfusion   . Anxiety   . Depression   . Peripheral vascular disease   . Diabetes mellitus     IDDM Since Age 43  . Embolism - blood clot   . Renal insufficiency     Past Surgical History  Procedure Laterality Date  . Wrist surgery      left  . Embolectomy  01/29/2011    Procedure: EMBOLECTOMY;  Surgeon: Mal Misty, MD;  Location: [facility] OR;  Service: Vascular;  Laterality: Left;  Left Popliteal and Tibial Embolectomy with patch angioplasty  . Dilation and curettage of uterus    . Multiple tooth extractions    . Amputation  03/03/2011    Procedure: AMPUTATION DIGIT;  Surgeon: Newt Minion, MD;  Location: [place];  Service: Orthopedics;  Laterality: Left;  Left Foot Amputation 4th and 5th toes at MTP joint  . Tee without cardioversion  04/15/2011    Procedure: TRANSESOPHAGEAL ECHOCARDIOGRAM (TEE);  Surgeon: [name], MD;  [facility];  Service: Cardiovascular;  Laterality: N/A;  . Amputation  04/22/2011    Procedure: AMPUTATION BELOW KNEE;  Surgeon: Newt Minion, MD;  Location: [place];  Service: Orthopedics;  Laterality: Left;  Left Below Knee Amputation    Family History  Problem Relation Age of Onset  . COPD Mother     alive - 104  . Hypertension Mother   . Diabetes Mother   . Stroke Mother   . Coronary artery disease Mother   . Diabetes Father     alive - 5  . Hypertension Father   . Coronary artery disease Father   . Anesthesia problems Neg Hx  History  Substance Use Topics  . Smoking status: Passive Smoke Exposure - Never Smoker -- 1.00 packs/day for 21 years    Types: Cigarettes    Last Attempt to Quit: 01/30/2011  . Smokeless tobacco: Never Used  . Alcohol Use: No    OB History   Grav Para Term Preterm Abortions TAB SAB Ect Mult Living   1 1  1      1       Review of Systems A complete 10 system review of systems was obtained and all systems are negative except as noted in the HPI and PMH.    Allergies  Aspirin; Ibuprofen; and Losartan  Home Medications   Current Outpatient Rx  Name  Route  Sig  Dispense  Refill  . ALPRAZolam (XANAX) 1 MG tablet   Oral   Take 1 mg by mouth 3 (three) times daily as needed for anxiety.         Marland Kitchen aspirin 81 MG chewable tablet   Oral   Chew 1 tablet (81 mg total) by mouth daily.           Tolerating in  hospital without side effects   . atorvastatin (LIPITOR) 80 MG tablet   Oral   Take 1 tablet (80 mg total) by mouth daily at 6 PM.   30 tablet   6   . clopidogrel (PLAVIX) 75 MG tablet   Oral   Take 1 tablet (75 mg total) by mouth daily.   30 tablet   6   . insulin aspart (NOVOLOG FLEXPEN) 100 UNIT/ML injection   Subcutaneous   Inject 2-10 Units into the skin 3 (three) times daily before meals. Sliding scale as follows 200-250=2 units 251-300=4 units 301-350=6 units 351-400=8 units >400-give 10 units and Notify MD         . Insulin Glargine (LANTUS SOLOSTAR) 100 UNIT/ML SOPN   Subcutaneous   Inject 15 Units into the skin at bedtime.         . metoprolol tartrate (LOPRESSOR) 25 MG tablet   Oral   Take 1 tablet (25 mg total) by mouth 2 (two) times daily.   60 tablet   6   . nitroGLYCERIN (NITROSTAT) 0.4 MG SL tablet   Sublingual   Place 1 tablet (0.4 mg total) under the tongue every 5 (five) minutes as needed for chest pain.   25 tablet   5   . omeprazole (PRILOSEC) 20 MG capsule   Oral   Take 20 mg by mouth daily.         . ondansetron (ZOFRAN) 8 MG tablet   Oral   Take 8 mg by mouth every 8 (eight) hours as needed for nausea.         Marland Kitchen oxyCODONE-acetaminophen (PERCOCET) 10-325 MG per tablet   Oral   Take 1 tablet by mouth 4 (four) times daily.         Marland Kitchen PARoxetine (PAXIL) 20 MG tablet   Oral   Take 20 mg by mouth daily.         . promethazine (PHENERGAN) 25 MG tablet   Oral   Take 25 mg by mouth every 6 (six) hours as needed for nausea.         . sennosides-docusate sodium (SENOKOT-S) 8.6-50 MG tablet   Oral   Take 1 tablet by mouth 2 (two) times daily.          BP 113/67  Pulse 103  Temp(Src) 98.2 F (36.8 C) (  Oral)  Resp 14  Ht 5\' 1"  (1.549 m)  Wt 125 lb (56.7 kg)  BMI 23.63 kg/m2  SpO2 96%  LMP 12/28/2010  Physical Exam  Nursing note and vitals reviewed. Constitutional: She is oriented to person, place, and time. She  appears well-developed and well-nourished. No distress.  HENT:  Head: Normocephalic and atraumatic.  Eyes: EOM are normal.  Neck: Neck supple. No tracheal deviation present.  Cardiovascular: Normal rate, regular rhythm and normal heart sounds.   No murmur heard. Pulmonary/Chest: Effort normal and breath sounds normal. No respiratory distress. She has no wheezes. She has no rales. She exhibits tenderness.  Tenderness to palpation across the upper back and right chest  Musculoskeletal: Normal range of motion.  S/p right BKA  Neurological: She is alert and oriented to person, place, and time.  Skin: Skin is warm and dry.  Psychiatric: She has a normal mood and affect. Her behavior is normal.    ED Course  Procedures (including critical care time)  DIAGNOSTIC STUDIES: Oxygen Saturation is 96% on room air, normal by my interpretation.    COORDINATION OF CARE: 11:05 PM-Discussed treatment plan which includes cardiac workup with pt at bedside and pt agreed to plan.    Labs Review Labs Reviewed  CBC WITH DIFFERENTIAL - Abnormal; Notable for the following:    Platelets 454 (*)    All other components within normal limits  BASIC METABOLIC PANEL - Abnormal; Notable for the following:    Sodium 133 (*)    Chloride 93 (*)    Glucose, Bld 473 (*)    Creatinine, Ser 1.46 (*)    GFR calc non Af Amer 43 (*)    GFR calc Af Amer 50 (*)    All other components within normal limits  TROPONIN I    Imaging Review Dg Chest Portable 1 View  01/07/2013   CLINICAL DATA:  Left-sided chest pain. Intermittent throbbing chest pain.  EXAM: PORTABLE CHEST - 1 VIEW  COMPARISON:  09/30/2012.  FINDINGS: The heart size and mediastinal contours are within normal limits. Both lungs are clear. The visualized skeletal structures are unremarkable.  IMPRESSION: No active disease.   Electronically Signed   By: Dereck Ligas M.D.   On: 01/07/2013 22:23    EKG Interpretation    Date/Time:  Sunday January 07 2013 22:03:36 EST Ventricular Rate:  113 PR Interval:  154 QRS Duration: 78 QT Interval:  334 QTC Calculation: 458 R Axis:   -85 Text Interpretation:  Sinus tachycardia Biatrial enlargement Left axis deviation Inferior infarct (cited on or before 01-Oct-2012) Anteroseptal infarct (cited on or before 27-Jan-2011) Abnormal ECG When compared with ECG of 07-Jan-2013 22:03, Questionable change in initial forces of Septal leads Nonspecific T wave abnormality no longer evident in Anterior leads Confirmed by DELOS  MD, Alphonso Gregson ([id number]) on 01/07/2013 11:29:17 PM            MDM  No diagnosis found. Patient is a 42 year old female with history of insulin-dependent diabetes, recent myocardial infarction. She presents today with complaints of chills, cough, and congestion for the past several days. She states she's been coughing so much that her chest hurts. Workup reveals an essentially unchanged EKG with no evidence for an acute cardiac event. Her troponin is negative despite 16 hours of discomfort. I have little suspicion this is cardiac in nature and believe that it is infectious, likely viral. Her sugar was found to be elevated and she will be given an injection of insulin to  correct this. She was given a dose of morphine while in the emergency department and is feeling better. At this point I feel as though she is appropriate for discharge, to return if her symptoms worsen or change.    I personally performed the services described in this documentation, which was scribed in my presence. The recorded information has been reviewed and is accurate.      Lisa Speak, MD 01/08/13 [phone]

## 2013-01-07 NOTE — ED Notes (Signed)
MD at bedside. 

## 2013-01-07 NOTE — ED Notes (Signed)
Obtained EKG upon arrival to room 5.  Pulled old one from [place] and handed both to Dr Rogene Houston.

## 2013-01-07 NOTE — ED Notes (Signed)
Pt with pain across shoulder blades and nausea, denies vomiting, ASA 81mg  daily and states today's dose has been taken, took one SL NTG without relief

## 2013-01-08 MED ORDER — INSULIN ASPART 100 UNIT/ML ~~LOC~~ SOLN
8.0000 [IU] | Freq: Once | SUBCUTANEOUS | Status: AC
  Administered 2013-01-08: 8 [IU] via SUBCUTANEOUS

## 2013-01-08 MED ORDER — HYDROCOD POLST-CHLORPHEN POLST 10-8 MG/5ML PO LQCR: 5.0000 mL | Freq: Two times a day (BID) | ORAL | Status: DC | PRN

## 2013-01-31 ENCOUNTER — Ambulatory Visit: Payer: Medicaid Other | Admitting: Cardiology

## 2013-02-12 ENCOUNTER — Emergency Department (HOSPITAL_COMMUNITY): Payer: Medicaid Other

## 2013-02-12 ENCOUNTER — Encounter (HOSPITAL_COMMUNITY): Payer: Self-pay | Admitting: Emergency Medicine

## 2013-02-12 ENCOUNTER — Emergency Department (HOSPITAL_COMMUNITY)
Admission: EM | Admit: 2013-02-12 | Discharge: 2013-02-12 | Disposition: A | Discharge status: Home / Self Care | Payer: Medicaid Other | Attending: Emergency Medicine | Admitting: Emergency Medicine

## 2013-02-12 DIAGNOSIS — E785 Hyperlipidemia, unspecified: Secondary | ICD-10-CM | POA: Insufficient documentation

## 2013-02-12 DIAGNOSIS — Z86718 Personal history of other venous thrombosis and embolism: Secondary | ICD-10-CM | POA: Insufficient documentation

## 2013-02-12 DIAGNOSIS — M25579 Pain in unspecified ankle and joints of unspecified foot: Secondary | ICD-10-CM | POA: Insufficient documentation

## 2013-02-12 DIAGNOSIS — Z79899 Other long term (current) drug therapy: Secondary | ICD-10-CM | POA: Insufficient documentation

## 2013-02-12 DIAGNOSIS — F411 Generalized anxiety disorder: Secondary | ICD-10-CM | POA: Insufficient documentation

## 2013-02-12 DIAGNOSIS — E1159 Type 2 diabetes mellitus with other circulatory complications: Secondary | ICD-10-CM | POA: Insufficient documentation

## 2013-02-12 DIAGNOSIS — F3289 Other specified depressive episodes: Secondary | ICD-10-CM | POA: Insufficient documentation

## 2013-02-12 DIAGNOSIS — Z794 Long term (current) use of insulin: Secondary | ICD-10-CM | POA: Insufficient documentation

## 2013-02-12 DIAGNOSIS — Z7902 Long term (current) use of antithrombotics/antiplatelets: Secondary | ICD-10-CM | POA: Insufficient documentation

## 2013-02-12 DIAGNOSIS — Z872 Personal history of diseases of the skin and subcutaneous tissue: Secondary | ICD-10-CM | POA: Insufficient documentation

## 2013-02-12 DIAGNOSIS — Z8619 Personal history of other infectious and parasitic diseases: Secondary | ICD-10-CM | POA: Insufficient documentation

## 2013-02-12 DIAGNOSIS — M79671 Pain in right foot: Secondary | ICD-10-CM

## 2013-02-12 DIAGNOSIS — I798 Other disorders of arteries, arterioles and capillaries in diseases classified elsewhere: Secondary | ICD-10-CM | POA: Insufficient documentation

## 2013-02-12 DIAGNOSIS — Z7982 Long term (current) use of aspirin: Secondary | ICD-10-CM | POA: Insufficient documentation

## 2013-02-12 DIAGNOSIS — Z87448 Personal history of other diseases of urinary system: Secondary | ICD-10-CM | POA: Insufficient documentation

## 2013-02-12 DIAGNOSIS — F329 Major depressive disorder, single episode, unspecified: Secondary | ICD-10-CM | POA: Insufficient documentation

## 2013-02-12 LAB — CBC WITH DIFFERENTIAL/PLATELET
Basophils Absolute: 0 10*3/uL (ref 0.0–0.1)
Basophils Relative: 0 % (ref 0–1)
Eosinophils Absolute: 0.2 10*3/uL (ref 0.0–0.7)
Eosinophils Relative: 2 % (ref 0–5)
HCT: 34.4 % — ABNORMAL LOW (ref 36.0–46.0)
Hemoglobin: 11.8 g/dL — ABNORMAL LOW (ref 12.0–15.0)
Lymphocytes Relative: 28 % (ref 12–46)
Lymphs Abs: 2.8 10*3/uL (ref 0.7–4.0)
MCH: 29.6 pg (ref 26.0–34.0)
MCHC: 34.3 g/dL (ref 30.0–36.0)
MCV: 86.4 fL (ref 78.0–100.0)
Monocytes Absolute: 0.7 10*3/uL (ref 0.1–1.0)
Monocytes Relative: 7 % (ref 3–12)
Neutro Abs: 6.2 10*3/uL (ref 1.7–7.7)
Neutrophils Relative %: 62 % (ref 43–77)
Platelets: 458 10*3/uL — ABNORMAL HIGH (ref 150–400)
RBC: 3.98 MIL/uL (ref 3.87–5.11)
RDW: 13.8 % (ref 11.5–15.5)
WBC: 9.9 10*3/uL (ref 4.0–10.5)

## 2013-02-12 LAB — BASIC METABOLIC PANEL
BUN: 14 mg/dL (ref 6–23)
CO2: 21 mEq/L (ref 19–32)
Calcium: 8.5 mg/dL (ref 8.4–10.5)
Chloride: 99 mEq/L (ref 96–112)
Creatinine, Ser: 0.88 mg/dL (ref 0.50–1.10)
GFR calc Af Amer: >90 mL/min (ref >90)
GFR calc non Af Amer: 79 mL/min — ABNORMAL LOW (ref >90)
Glucose, Bld: 431 mg/dL — ABNORMAL HIGH (ref 70–99)
Potassium: 3.8 mEq/L (ref 3.7–5.3)
Sodium: 133 mEq/L — ABNORMAL LOW (ref 137–147)

## 2013-02-12 LAB — LACTIC ACID, PLASMA: Lactic Acid, Venous: 1.2 mmol/L (ref 0.5–2.2)

## 2013-02-12 MED ORDER — SODIUM CHLORIDE 0.9 % IV SOLN
1000.0000 mL | Freq: Once | INTRAVENOUS | Status: AC
  Administered 2013-02-12: 1000 mL via INTRAVENOUS

## 2013-02-12 MED ORDER — TRAMADOL HCL 50 MG PO TABS: 50.0000 mg | ORAL_TABLET | Freq: Four times a day (QID) | ORAL | Status: DC | PRN

## 2013-02-12 MED ORDER — SODIUM CHLORIDE 0.9 % IV SOLN: 1000.0000 mL | INTRAVENOUS | Status: DC

## 2013-02-12 NOTE — ED Notes (Signed)
Pt reports pain on the top of her right foot that started approx 2 hours ago, states pain is similar to the pain she had with a blood clot to left ankle

## 2013-02-12 NOTE — ED Provider Notes (Signed)
CSN: [id number]     Arrival date & time 02/12/13  [id number] History   First MD Initiated Contact with Patient 02/12/13 [phone]     Chief Complaint  Patient presents with  . Foot Pain   (Consider location/radiation/quality/duration/timing/severity/associated sxs/prior Treatment) Patient is a 43 y.o. female presenting with lower extremity pain. The history is provided by the patient.  Foot Pain  She woke up this evening with pain in the dorsum of her right foot near the ankle. Pain is dull and gradually spread to involve the entire foot. Pain is moderate she rates it at 4/10. It is worse with standing and with movement. There is was no history of trauma. She's concerned because she had similar symptoms in her left foot which was found to be due 2 a blood clot, and eventually she had to have a below-the-knee amputation.  Past Medical History  Diagnosis Date  . Shingles     11/2010  . Cellulitis     of face - 12/2010  . Peripheral vascular disease in diabetes mellitus   . Hyperlipidemia   . Tobacco abuse     0.5 - 1ppd x 21 yrs  . Headache(784.0)   . Renal disorder     glomerulonephritis  . Blood transfusion   . Anxiety   . Depression   . Peripheral vascular disease   . Diabetes mellitus     IDDM Since Age 36  . Embolism - blood clot   . Renal insufficiency    Past Surgical History  Procedure Laterality Date  . Wrist surgery      left  . Embolectomy  01/29/2011    Procedure: EMBOLECTOMY;  Surgeon: Mal Misty, MD;  Location: [facility] OR;  Service: Vascular;  Laterality: Left;  Left Popliteal and Tibial Embolectomy with patch angioplasty  . Dilation and curettage of uterus    . Multiple tooth extractions    . Amputation  03/03/2011    Procedure: AMPUTATION DIGIT;  Surgeon: Newt Minion, MD;  Location: [place];  Service: Orthopedics;  Laterality: Left;  Left Foot Amputation 4th and 5th toes at MTP joint  . Tee without cardioversion  04/15/2011    Procedure: TRANSESOPHAGEAL ECHOCARDIOGRAM  (TEE);  Surgeon: [name], MD;  [facility];  Service: Cardiovascular;  Laterality: N/A;  . Amputation  04/22/2011    Procedure: AMPUTATION BELOW KNEE;  Surgeon: Newt Minion, MD;  Location: [place];  Service: Orthopedics;  Laterality: Left;  Left Below Knee Amputation   Family History  Problem Relation Age of Onset  . COPD Mother     alive - 26  . Hypertension Mother   . Diabetes Mother   . Stroke Mother   . Coronary artery disease Mother   . Diabetes Father     alive - 58  . Hypertension Father   . Coronary artery disease Father   . Anesthesia problems Neg Hx    History  Substance Use Topics  . Smoking status: Passive Smoke Exposure - Never Smoker -- 1.00 packs/day for 21 years    Types: Cigarettes    Last Attempt to Quit: 01/30/2011  . Smokeless tobacco: Never Used  . Alcohol Use: No   OB History   Grav Para Term Preterm Abortions TAB SAB Ect Mult Living   1 1  1      1      Review of Systems  All other systems reviewed and are negative.    Allergies  Aspirin; Ibuprofen; and  Losartan  Home Medications   Current Outpatient Rx  Name  Route  Sig  Dispense  Refill  . ALPRAZolam (XANAX) 1 MG tablet   Oral   Take 1 mg by mouth 3 (three) times daily as needed for anxiety.         Marland Kitchen aspirin 81 MG chewable tablet   Oral   Chew 1 tablet (81 mg total) by mouth daily.           Tolerating in hospital without side effects   . atorvastatin (LIPITOR) 80 MG tablet   Oral   Take 1 tablet (80 mg total) by mouth daily at 6 PM.   30 tablet   6   . clopidogrel (PLAVIX) 75 MG tablet   Oral   Take 1 tablet (75 mg total) by mouth daily.   30 tablet   6   . insulin aspart (NOVOLOG FLEXPEN) 100 UNIT/ML injection   Subcutaneous   Inject 2-10 Units into the skin 3 (three) times daily before meals. Sliding scale as follows 200-250=2 units 251-300=4 units 301-350=6 units 351-400=8 units >400-give 10 units and Notify MD         . Insulin Glargine  (LANTUS SOLOSTAR) 100 UNIT/ML SOPN   Subcutaneous   Inject 15 Units into the skin at bedtime.         . metoprolol tartrate (LOPRESSOR) 25 MG tablet   Oral   Take 1 tablet (25 mg total) by mouth 2 (two) times daily.   60 tablet   6   . nitroGLYCERIN (NITROSTAT) 0.4 MG SL tablet   Sublingual   Place 1 tablet (0.4 mg total) under the tongue every 5 (five) minutes as needed for chest pain.   25 tablet   5   . omeprazole (PRILOSEC) 20 MG capsule   Oral   Take 20 mg by mouth daily.         . ondansetron (ZOFRAN) 8 MG tablet   Oral   Take 8 mg by mouth every 8 (eight) hours as needed for nausea.         Marland Kitchen oxyCODONE-acetaminophen (PERCOCET) 10-325 MG per tablet   Oral   Take 1 tablet by mouth 4 (four) times daily.         Marland Kitchen PARoxetine (PAXIL) 20 MG tablet   Oral   Take 20 mg by mouth daily.         . promethazine (PHENERGAN) 25 MG tablet   Oral   Take 25 mg by mouth every 6 (six) hours as needed for nausea.         . sennosides-docusate sodium (SENOKOT-S) 8.6-50 MG tablet   Oral   Take 1 tablet by mouth 2 (two) times daily.         . chlorpheniramine-HYDROcodone (TUSSIONEX PENNKINETIC ER) 10-8 MG/5ML LQCR   Oral   Take 5 mLs by mouth every 12 (twelve) hours as needed for cough.   115 mL   0    BP 195/79  Pulse 126  Temp(Src) 98.5 F (36.9 C) (Oral)  Ht 5\' 1"  (1.549 m)  Wt 121 lb (54.885 kg)  BMI 22.87 kg/m2  SpO2 98%  LMP 12/28/2010 Physical Exam  Nursing note and vitals reviewed.  43 year old female, resting comfortably and in no acute distress. Vital signs are significant for tachycardia with heart rate of 126, and hypertension with blood pressure 195/79. Oxygen saturation is 98%, which is normal. Head is normocephalic and atraumatic. PERRLA, EOMI. Oropharynx is clear.  Neck is nontender and supple without adenopathy or JVD. Back is nontender and there is no CVA tenderness. Lungs are clear without rales, wheezes, or rhonchi. Chest is  nontender. Heart has regular rate and rhythm without murmur. Abdomen is soft, flat, nontender without masses or hepatosplenomegaly and peristalsis is normoactive. Extremities: There is a left below the knee amputation. Right foot does have some mild tenderness across the dorsum of the foot. There is no erythema or pallor. Dorsalis pedis pulse is 1-2+. Capillary refill is prompt. There is normal sensation. Full passive range of motion is present. Skin is warm and dry without rash. Neurologic: Mental status is normal, cranial nerves are intact, there are no motor or sensory deficits.  ED Course  Procedures (including critical care time) Labs Review Results for orders placed during the hospital encounter of 02/12/13  LACTIC ACID, PLASMA      Result Value Range   Lactic Acid, Venous 1.2  0.5 - 2.2 mmol/L  CBC WITH DIFFERENTIAL      Result Value Range   WBC 9.9  4.0 - 10.5 K/uL   RBC 3.98  3.87 - 5.11 MIL/uL   Hemoglobin 11.8 (*) 12.0 - 15.0 g/dL   HCT 34.4 (*) 36.0 - 46.0 %   MCV 86.4  78.0 - 100.0 fL   MCH 29.6  26.0 - 34.0 pg   MCHC 34.3  30.0 - 36.0 g/dL   RDW 13.8  11.5 - 15.5 %   Platelets 458 (*) 150 - 400 K/uL   Neutrophils Relative % 62  43 - 77 %   Neutro Abs 6.2  1.7 - 7.7 K/uL   Lymphocytes Relative 28  12 - 46 %   Lymphs Abs 2.8  0.7 - 4.0 K/uL   Monocytes Relative 7  3 - 12 %   Monocytes Absolute 0.7  0.1 - 1.0 K/uL   Eosinophils Relative 2  0 - 5 %   Eosinophils Absolute 0.2  0.0 - 0.7 K/uL   Basophils Relative 0  0 - 1 %   Basophils Absolute 0.0  0.0 - 0.1 K/uL  BASIC METABOLIC PANEL      Result Value Range   Sodium 133 (*) 137 - 147 mEq/L   Potassium 3.8  3.7 - 5.3 mEq/L   Chloride 99  96 - 112 mEq/L   CO2 21  19 - 32 mEq/L   Glucose, Bld 431 (*) 70 - 99 mg/dL   BUN 14  6 - 23 mg/dL   Creatinine, Ser 0.88  0.50 - 1.10 mg/dL   Calcium 8.5  8.4 - 10.5 mg/dL   GFR calc non Af Amer 79 (*) >90 mL/min   GFR calc Af Amer >90  >90 mL/min   Imaging Review Dg Foot  Complete Right  02/12/2013   CLINICAL DATA:  Right foot pain  EXAM: RIGHT FOOT COMPLETE - 3+ VIEW  COMPARISON:  None.  FINDINGS: There is no evidence of fracture or dislocation. There is no evidence of arthropathy or other focal bone abnormality. Atherosclerotic vascular calcifications.  IMPRESSION: No acute or aggressive osseous finding of the right foot. Atherosclerotic vascular calcifications are advanced, particularly for age.  Recommend a repeat radiograph in 7-10 days if concern for acute pathology persists, to evaluate for interval change.   Electronically Signed   By: Carlos Levering M.D.   On: 02/12/2013 06:19    MDM   1. Pain in right foot    Right foot pain of uncertain cause. Clinically, there is  no indication of arterial thrombosis or embolus. X-ray will be obtained and lactic acid level checked to screen for ischemia. Old records are reviewed and she had sequential amputations of her left lower extremity culminating in below the knee amputation.  Workup is unremarkable. She is discharged with prescription for tramadol and is to followup with her PCP.    Delora Fuel, MD [phone]

## 2013-02-12 NOTE — ED Notes (Signed)
Patient requests pain med. MD aware, no new orders given at this time.

## 2013-02-12 NOTE — ED Notes (Signed)
Patient states that she was not pleased with dr. Roxanne Mins.

## 2013-02-12 NOTE — Discharge Instructions (Signed)
Your tests showed no evidence of any significant blockage in the arteries leading to your right foot. No cause for your pain is not clear. Please follow up with your PCP.  Tramadol tablets What is this medicine? TRAMADOL (TRA ma dole) is a pain reliever. It is used to treat moderate to severe pain in adults. This medicine may be used for other purposes; ask your health care provider or pharmacist if you have questions. COMMON BRAND NAME(S): Ultram What should I tell my health care provider before I take this medicine? They need to know if you have any of these conditions: -brain tumor -depression -drug abuse or addiction -head injury -if you frequently drink alcohol containing drinks -kidney disease or trouble passing urine -liver disease -lung disease, asthma, or breathing problems -seizures or epilepsy -suicidal thoughts, plans, or attempt; a previous suicide attempt by you or a family member -an unusual or allergic reaction to tramadol, codeine, other medicines, foods, dyes, or preservatives -pregnant or trying to get pregnant -breast-feeding How should I use this medicine? Take this medicine by mouth with a full glass of water. Follow the directions on the prescription label. If the medicine upsets your stomach, take it with food or milk. Do not take more medicine than you are told to take. Talk to your pediatrician regarding the use of this medicine in children. Special care may be needed. Overdosage: If you think you have taken too much of this medicine contact a poison control center or emergency room at once. NOTE: This medicine is only for you. Do not share this medicine with others. What if I miss a dose? If you miss a dose, take it as soon as you can. If it is almost time for your next dose, take only that dose. Do not take double or extra doses. What may interact with this medicine? Do not take this medicine with any of the following medications: -MAOIs like Carbex,  Eldepryl, Marplan, Nardil, and Parnate This medicine may also interact with the following medications: -alcohol or medicines that contain alcohol -antihistamines -benzodiazepines -bupropion -carbamazepine or oxcarbazepine -clozapine -cyclobenzaprine -digoxin -furazolidone -linezolid -medicines for depression, anxiety, or psychotic disturbances -medicines for migraine headache like almotriptan, eletriptan, frovatriptan, naratriptan, rizatriptan, sumatriptan, zolmitriptan -medicines for pain like pentazocine, buprenorphine, butorphanol, meperidine, nalbuphine, and propoxyphene -medicines for sleep -muscle relaxants -naltrexone -phenobarbital -phenothiazines like perphenazine, thioridazine, chlorpromazine, mesoridazine, fluphenazine, prochlorperazine, promazine, and trifluoperazine -procarbazine -warfarin This list may not describe all possible interactions. Give your health care provider a list of all the medicines, herbs, non-prescription drugs, or dietary supplements you use. Also tell them if you smoke, drink alcohol, or use illegal drugs. Some items may interact with your medicine. What should I watch for while using this medicine? Tell your doctor or health care professional if your pain does not go away, if it gets worse, or if you have new or a different type of pain. You may develop tolerance to the medicine. Tolerance means that you will need a higher dose of the medicine for pain relief. Tolerance is normal and is expected if you take this medicine for a long time. Do not suddenly stop taking your medicine because you may develop a severe reaction. Your body becomes used to the medicine. This does NOT mean you are addicted. Addiction is a behavior related to getting and using a drug for a non-medical reason. If you have pain, you have a medical reason to take pain medicine. Your doctor will tell you how much medicine to take.  If your doctor wants you to stop the medicine, the dose  will be slowly lowered over time to avoid any side effects. You may get drowsy or dizzy. Do not drive, use machinery, or do anything that needs mental alertness until you know how this medicine affects you. Do not stand or sit up quickly, especially if you are an older patient. This reduces the risk of dizzy or fainting spells. Alcohol can increase or decrease the effects of this medicine. Avoid alcoholic drinks. You may have constipation. Try to have a bowel movement at least every 2 to 3 days. If you do not have a bowel movement for 3 days, call your doctor or health care professional. Your mouth may get dry. Chewing sugarless gum or sucking hard candy, and drinking plenty of water may help. Contact your doctor if the problem does not go away or is severe. What side effects may I notice from receiving this medicine? Side effects that you should report to your doctor or health care professional as soon as possible: -allergic reactions like skin rash, itching or hives, swelling of the face, lips, or tongue -breathing difficulties, wheezing -confusion -itching -light headedness or fainting spells -redness, blistering, peeling or loosening of the skin, including inside the mouth -seizures Side effects that usually do not require medical attention (report to your doctor or health care professional if they continue or are bothersome): -constipation -dizziness -drowsiness -headache -nausea, vomiting This list may not describe all possible side effects. Call your doctor for medical advice about side effects. You may report side effects to FDA at 1-800-FDA-1088. Where should I keep my medicine? Keep out of the reach of children. Store at room temperature between 15 and 30 degrees C (59 and 86 degrees F). Keep container tightly closed. Throw away any unused medicine after the expiration date. NOTE: This sheet is a summary. It may not cover all possible information. If you have questions about this  medicine, talk to your doctor, pharmacist, or health care provider.  2014, Elsevier/Gold Standard. (2009-09-17 11:55:44)

## 2013-02-20 ENCOUNTER — Encounter: Payer: Self-pay | Admitting: Cardiology

## 2013-02-20 ENCOUNTER — Ambulatory Visit (INDEPENDENT_AMBULATORY_CARE_PROVIDER_SITE_OTHER): Payer: Medicaid Other | Admitting: Cardiology

## 2013-02-20 VITALS — BP 147/65 | HR 81 | Ht 61.0 in | Wt 130.0 lb

## 2013-02-20 DIAGNOSIS — I2589 Other forms of chronic ischemic heart disease: Secondary | ICD-10-CM

## 2013-02-20 DIAGNOSIS — E785 Hyperlipidemia, unspecified: Secondary | ICD-10-CM

## 2013-02-20 DIAGNOSIS — Z794 Long term (current) use of insulin: Secondary | ICD-10-CM

## 2013-02-20 DIAGNOSIS — E118 Type 2 diabetes mellitus with unspecified complications: Secondary | ICD-10-CM

## 2013-02-20 DIAGNOSIS — E119 Type 2 diabetes mellitus without complications: Secondary | ICD-10-CM

## 2013-02-20 DIAGNOSIS — I743 Embolism and thrombosis of arteries of the lower extremities: Secondary | ICD-10-CM

## 2013-02-20 DIAGNOSIS — I2510 Atherosclerotic heart disease of native coronary artery without angina pectoris: Secondary | ICD-10-CM | POA: Insufficient documentation

## 2013-02-20 DIAGNOSIS — I255 Ischemic cardiomyopathy: Secondary | ICD-10-CM | POA: Insufficient documentation

## 2013-02-20 DIAGNOSIS — IMO0001 Reserved for inherently not codable concepts without codable children: Secondary | ICD-10-CM

## 2013-02-20 NOTE — Patient Instructions (Signed)
Your physician wants you to follow-up in: 6 months . You will receive a reminder letter in the mail two months in advance. If you don't receive a letter, please call our office to schedule the follow-up appointment.  Your physician recommends that you continue on your current medications as directed. Please refer to the Current Medication list given to you today.   Please have blood work drawn as soon as you can.NOTHING to eat or drink 12 hrs before   Thanks for choosing [facility] !

## 2013-02-20 NOTE — Assessment & Plan Note (Signed)
Patient has a history of prior embolectomy and ultimately left AKA. She was previously on Coumadin, this was discontinued by Dr. Martinique around the time of her cardiac intervention in September of last year. She was placed on DAPT at that time. We may continue DAPT as a long-term regimen going forward, although could consider going back anticoagulation after her year treatment.

## 2013-02-20 NOTE — Assessment & Plan Note (Signed)
She is tolerating high-dose Lipitor. Followup FLP and LFT.

## 2013-02-20 NOTE — Progress Notes (Signed)
Clinical Summary [name] is a medically complex 43 y.o.female presenting for an office visit. She is a former patient of Dr. Lattie Haw, this is our first meeting in the office. She was last seen by Ms. Lawrence NP back in October 2014. History reviewed below  Most recently, antiplatelet regimen has included aspirin and Plavix. She has not had a recent lipid panel. Echocardiogram from September 2014 showed moderate LVH with LVEF [id number], grade 1 diastolic dysfunction, wall motion abnormalities consistent with ischemic cardiomyopathy, mild posterior leaflet mitral prolapse with trivial regurgitation, mild left atrial enlargement.  She reports no angina symptoms. States that she has been compliant with DAPT. No orthopnea or PND, no progressive shortness of breath.   Allergies  Allergen Reactions  . Ibuprofen Other (See Comments)    Kidney disease  . Losartan Swelling    Current Outpatient Prescriptions  Medication Sig Dispense Refill  . ALPRAZolam (XANAX) 1 MG tablet Take 1 mg by mouth 3 (three) times daily as needed for anxiety.      Marland Kitchen aspirin 81 MG chewable tablet Chew 1 tablet (81 mg total) by mouth daily.      Marland Kitchen atorvastatin (LIPITOR) 80 MG tablet Take 1 tablet (80 mg total) by mouth daily at 6 PM.  30 tablet  6  . insulin aspart (NOVOLOG FLEXPEN) 100 UNIT/ML injection Inject 2-10 Units into the skin 3 (three) times daily before meals. Sliding scale as follows 200-250=2 units 251-300=4 units 301-350=6 units 351-400=8 units >400-give 10 units and Notify MD      . Insulin Glargine (LANTUS SOLOSTAR) 100 UNIT/ML SOPN Inject 15 Units into the skin at bedtime.      . metoprolol tartrate (LOPRESSOR) 25 MG tablet Take 1 tablet (25 mg total) by mouth 2 (two) times daily.  60 tablet  6  . nitroGLYCERIN (NITROSTAT) 0.4 MG SL tablet Place 1 tablet (0.4 mg total) under the tongue every 5 (five) minutes as needed for chest pain.  25 tablet  5  . omeprazole (PRILOSEC) 20 MG capsule Take 20 mg  by mouth daily.      Marland Kitchen oxyCODONE-acetaminophen (PERCOCET) 10-325 MG per tablet Take 1 tablet by mouth 4 (four) times daily.      Marland Kitchen PARoxetine (PAXIL) 20 MG tablet Take 20 mg by mouth daily.      . promethazine (PHENERGAN) 25 MG tablet Take 25 mg by mouth every 6 (six) hours as needed for nausea.      . [DISCONTINUED] losartan (COZAAR) 25 MG tablet Take 12.5 mg by mouth daily.       No current facility-administered medications for this visit.    Past Medical History  Diagnosis Date  . Shingles     11/2010  . Facial cellulitis     12/2010  . Hyperlipidemia   . Headache(784.0)   . Glomerulonephritis   . Anxiety   . Depression   . Peripheral arterial disease   . Insulin dependent diabetes mellitus     History of diabetic ketoacidosis  . Arterial thromboembolism     Prior embolectomy, previously on Coumadin  . Coronary atherosclerosis of native coronary artery     DES to LAD September 2014  . History of stroke   . ST elevation myocardial infarction (STEMI) of anterior wall     Late presentation September 2014  . Ischemic cardiomyopathy     LVEF 40-45%    Social History Ms. Bail reports that she has been passively smoking Cigarettes.  She has a 21 pack-year  smoking history. She has never used smokeless tobacco. Ms. Yoh reports that she does not drink alcohol.  Review of Systems Negative except as outlined.  Physical Examination Filed Vitals:   02/20/13 1025  BP: 147/65  Pulse: 81   Filed Weights   02/20/13 1025  Weight: 130 lb (58.968 kg)   Appears comfortable at rest. HEENT: Conjunctiva and lids normal, oropharynx clear. Neck: Supple, no elevated JVP or carotid bruits, no thyromegaly. Lungs: Clear to auscultation, nonlabored breathing at rest. Cardiac: Regular rate and rhythm, no S3 or significant systolic murmur, no pericardial rub. Abdomen: Soft, nontender, bowel sounds present. Extremities: Status post left BKA, no edema on right, distal pulses 2+. Skin:  Warm and dry. Musculoskeletal: No kyphosis. Neuropsychiatric: Alert and oriented x3, affect grossly appropriate.   Problem List and Plan   Coronary atherosclerosis of native coronary artery Symptomatically stable status post DES to the LAD in September 2014. I reinforced the importance of continuing aspirin and Plavix without interruption for a least a year from intervention. She states that she has been compliant. Continue observation, followup in 6 months.  Cardiomyopathy, ischemic LVEF 40-45% as of last year. We will consider repeat echocardiogram around time of her next visit in may need to further optimize medical therapy.  Hyperlipidemia She is tolerating high-dose Lipitor. Followup FLP and LFT.  Insulin dependent diabetes mellitus with complications Keep followup with Dr. Luan Pulling.  Thromboembolism of lower extremity artery Patient has a history of prior embolectomy and ultimately left AKA. She was previously on Coumadin, this was discontinued by Dr. Martinique around the time of her cardiac intervention in September of last year. She was placed on DAPT at that time. We may continue DAPT as a long-term regimen going forward, although could consider going back anticoagulation after her year treatment.    Satira Sark, M.D., F.A.C.C.

## 2013-02-20 NOTE — Assessment & Plan Note (Signed)
Symptomatically stable status post DES to the LAD in September 2014. I reinforced the importance of continuing aspirin and Plavix without interruption for a least a year from intervention. She states that she has been compliant. Continue observation, followup in 6 months.

## 2013-02-20 NOTE — Assessment & Plan Note (Signed)
LVEF 40-45% as of last year. We will consider repeat echocardiogram around time of her next visit in may need to further optimize medical therapy.

## 2013-02-20 NOTE — Assessment & Plan Note (Signed)
Keep followup with Dr. Luan Pulling.

## 2013-05-23 ENCOUNTER — Other Ambulatory Visit (HOSPITAL_COMMUNITY): Payer: Self-pay | Admitting: Pulmonary Disease

## 2013-05-23 DIAGNOSIS — M79604 Pain in right leg: Secondary | ICD-10-CM

## 2013-05-29 ENCOUNTER — Ambulatory Visit (HOSPITAL_COMMUNITY): Payer: Medicaid Other

## 2013-05-30 ENCOUNTER — Other Ambulatory Visit (HOSPITAL_COMMUNITY): Payer: Medicaid Other

## 2013-05-30 ENCOUNTER — Ambulatory Visit (HOSPITAL_COMMUNITY): Payer: Medicaid Other

## 2013-06-13 ENCOUNTER — Ambulatory Visit (HOSPITAL_COMMUNITY): Admission: RE | Admit: 2013-06-13 | Payer: Medicaid Other | Source: Ambulatory Visit | Admitting: Physical Therapy

## 2013-06-15 ENCOUNTER — Ambulatory Visit (HOSPITAL_COMMUNITY): Payer: Medicaid Other | Attending: Pulmonary Disease | Admitting: Specialist

## 2013-06-15 ENCOUNTER — Ambulatory Visit (HOSPITAL_COMMUNITY): Payer: Medicaid Other | Admitting: Physical Therapy

## 2013-08-03 ENCOUNTER — Ambulatory Visit (HOSPITAL_COMMUNITY): Admission: RE | Admit: 2013-08-03 | Payer: Medicaid Other | Source: Ambulatory Visit

## 2013-08-06 ENCOUNTER — Telehealth: Payer: Self-pay | Admitting: Cardiology

## 2013-08-06 MED ORDER — CLOPIDOGREL BISULFATE 75 MG PO TABS: 75.0000 mg | ORAL_TABLET | Freq: Every day | ORAL | Status: DC

## 2013-08-06 NOTE — Telephone Encounter (Signed)
Needs refill on plavix sent to [facility]. / tgs

## 2013-08-06 NOTE — Telephone Encounter (Signed)
Medication sent via escribe. For only 2 months until seen by Domenic Polite in August. Dr Domenic Polite wanted her to stay on Plavix and ASA uninterrupted for 1 year post procedure.

## 2013-08-07 ENCOUNTER — Telehealth: Payer: Self-pay | Admitting: *Deleted

## 2013-08-07 MED ORDER — CLOPIDOGREL BISULFATE 75 MG PO TABS: 75.0000 mg | ORAL_TABLET | Freq: Every day | ORAL | Status: DC

## 2013-08-07 NOTE — Telephone Encounter (Signed)
Medication sent to pharmacy. Pt notified.

## 2013-08-07 NOTE — Telephone Encounter (Signed)
Message copied by Desma Mcgregor on Tue Aug 07, 2013  7:03 AM ------      Message from: MCDOWELL, Aloha Gell      Created: Mon Aug 06, 2013  9:38 PM       Please read through the note again. It is clear that she should still be on ASA and Plavix, although Plavix is not listed in her medication list - probably left out inadvertently when reviewed at check in. Please refill her Plavix.            ----- Message -----         From: Desma Mcgregor, CMA         Sent: 08/06/2013   3:38 PM           To: Satira Sark, MD            Pt said she has one more tab of Plavix; last office note in Feb does not show where she is on plavix or say to reorder.  Pt is stating she should be on plavix. Please advise       ------

## 2013-09-12 ENCOUNTER — Ambulatory Visit: Payer: Medicaid Other | Admitting: Cardiology

## 2013-10-22 ENCOUNTER — Encounter: Payer: Self-pay | Admitting: *Deleted

## 2013-10-22 ENCOUNTER — Encounter: Payer: Self-pay | Admitting: Cardiology

## 2013-10-22 ENCOUNTER — Encounter: Payer: Medicaid Other | Admitting: Cardiology

## 2013-10-22 NOTE — Progress Notes (Signed)
NNo-show. This encounter was created in error - please disregard.

## 2013-11-19 ENCOUNTER — Encounter: Payer: Self-pay | Admitting: Cardiology

## 2013-12-17 ENCOUNTER — Other Ambulatory Visit: Payer: Self-pay | Admitting: Adult Health

## 2013-12-27 ENCOUNTER — Encounter (HOSPITAL_COMMUNITY): Payer: Self-pay | Admitting: Vascular Surgery

## 2014-01-16 ENCOUNTER — Encounter: Payer: Self-pay | Admitting: *Deleted

## 2014-02-08 ENCOUNTER — Ambulatory Visit: Payer: Medicaid Other | Admitting: Adult Health

## 2014-02-12 ENCOUNTER — Ambulatory Visit (INDEPENDENT_AMBULATORY_CARE_PROVIDER_SITE_OTHER): Payer: Medicaid Other | Admitting: Adult Health

## 2014-02-12 ENCOUNTER — Encounter: Payer: Self-pay | Admitting: Adult Health

## 2014-02-12 VITALS — BP 112/62 | HR 61 | Ht 61.0 in | Wt 106.0 lb

## 2014-02-12 DIAGNOSIS — I2510 Atherosclerotic heart disease of native coronary artery without angina pectoris: Secondary | ICD-10-CM

## 2014-02-12 DIAGNOSIS — I255 Ischemic cardiomyopathy: Secondary | ICD-10-CM

## 2014-02-12 MED ORDER — METOPROLOL TARTRATE 25 MG PO TABS: 12.5000 mg | ORAL_TABLET | Freq: Two times a day (BID) | ORAL | Status: DC

## 2014-02-12 NOTE — Assessment & Plan Note (Signed)
No evidence of decompensation. She will continue current medications. Will repeat echo on next visit.

## 2014-02-12 NOTE — Patient Instructions (Signed)
Your physician wants you to follow-up in: 6 months with [name], NP. You will receive a reminder letter in the mail two months in advance. If you don't receive a letter, please call our office to schedule the follow-up appointment.  Your physician has recommended you make the following change in your medication:   Decrease: Metoprolol to 12.5 mg two times daily  Thank you for choosing [facility]!

## 2014-02-12 NOTE — Assessment & Plan Note (Addendum)
She is without cardiac complaint. No chest pain or hospitalizations for cardiac related issues. She has lost 30 lbs since being seen last. She is hypotensive and slightly bradycardic. She has has some mild dizziness that she attributes with issues concerning her diabetes control. I will decrease metoprolol to 12. 5 mg BID from 25 mg BID.  Will see her again in 6 months.

## 2014-02-12 NOTE — Progress Notes (Signed)
HPI: [name] is a 44 year old female patient of Dr. Domenic Polite, that we follow for ongoing assessment and management of CAD, with drug-eluting stent to the LAD in September 2014, ischemic cardiopathy with an LVEF of 40-45%, hyperlipidemia, and history of thromboembolism of the lower extremity on DAPT. She was seen in the office one year ago and was compliant with her medications and without symptomatic complaints.   She has lost 30 lbs since being seen last. She has suffered from a long bout of bronchitis and has had issues with her blood glucose. She is beginning to eat better now that she is feeling better. Has had some dizziness with low BP. Now off Plavix.       Allergies  Allergen Reactions  . Ibuprofen Other (See Comments)    Kidney disease  . Losartan Swelling    Current Outpatient Prescriptions  Medication Sig Dispense Refill  . ALPRAZolam (XANAX) 1 MG tablet Take 1 mg by mouth 3 (three) times daily as needed for anxiety.    Marland Kitchen aspirin 81 MG chewable tablet Chew 1 tablet (81 mg total) by mouth daily.    . insulin aspart (NOVOLOG FLEXPEN) 100 UNIT/ML injection Inject 2-10 Units into the skin 3 (three) times daily before meals. Sliding scale as follows 200-250=2 units 251-300=4 units 301-350=6 units 351-400=8 units >400-give 10 units and Notify MD    . Insulin Glargine (LANTUS SOLOSTAR) 100 UNIT/ML SOPN Inject 15 Units into the skin at bedtime.    . metoprolol tartrate (LOPRESSOR) 25 MG tablet TAKE ONE TABLET BY MOUTH TWICE A DAY 60 tablet 0  . nitroGLYCERIN (NITROSTAT) 0.4 MG SL tablet Place 1 tablet (0.4 mg total) under the tongue every 5 (five) minutes as needed for chest pain. 25 tablet 5  . ondansetron (ZOFRAN) 4 MG tablet Take 4 mg by mouth every 8 (eight) hours as needed for nausea or vomiting.    Marland Kitchen oxyCODONE-acetaminophen (PERCOCET) 10-325 MG per tablet Take 1 tablet by mouth 4 (four) times daily.    . [DISCONTINUED] losartan (COZAAR) 25 MG tablet Take 12.5 mg by mouth  daily.     No current facility-administered medications for this visit.    Past Medical History  Diagnosis Date  . Shingles     11/2010  . Facial cellulitis     12/2010  . Hyperlipidemia   . Headache(784.0)   . Glomerulonephritis   . Anxiety   . Depression   . Peripheral arterial disease   . Insulin dependent diabetes mellitus     History of diabetic ketoacidosis  . Arterial thromboembolism     Prior embolectomy, previously on Coumadin  . Coronary atherosclerosis of native coronary artery     DES to LAD September 2014  . History of stroke   . ST elevation myocardial infarction (STEMI) of anterior wall     Late presentation September 2014  . Ischemic cardiomyopathy     LVEF 40-45%    Past Surgical History  Procedure Laterality Date  . Wrist surgery      Left  . Embolectomy  01/29/2011    Procedure: EMBOLECTOMY;  Surgeon: Mal Misty, MD;  Location: [facility] OR;  Service: Vascular;  Laterality: Left;  Left Popliteal and Tibial Embolectomy with patch angioplasty  . Dilation and curettage of uterus    . Multiple tooth extractions    . Amputation  03/03/2011    Procedure: AMPUTATION DIGIT;  Surgeon: Newt Minion, MD;  Location: [facility];  Service: Orthopedics;  Laterality: Left;  Left Foot Amputation 4th and 5th toes at MTP joint  . Tee without cardioversion  04/15/2011    Procedure: TRANSESOPHAGEAL ECHOCARDIOGRAM (TEE);  Surgeon: [name], MD;  Location: [facility];  Service: Cardiovascular;  Laterality: N/A;  . Amputation  04/22/2011    Procedure: AMPUTATION BELOW KNEE;  Surgeon: Newt Minion, MD;  Location: [facility];  Service: Orthopedics;  Laterality: Left;  Left Below Knee Amputation  . Lower extremity angiogram N/A 01/27/2011    Procedure: LOWER EXTREMITY ANGIOGRAM;  Surgeon: Rosetta Posner, MD;  Location: [facility] CATH LAB;  Service: Cardiovascular;  Laterality: N/A;  . Lower extremity angiogram N/A 01/28/2011    Procedure: LOWER EXTREMITY ANGIOGRAM;  Surgeon: Conrad Bellfountain, MD;   Location: [facility] CATH LAB;  Service: Cardiovascular;  Laterality: N/A;  . Lower extremity angiogram Left 01/29/2011    Procedure: LOWER EXTREMITY ANGIOGRAM;  Surgeon: Mal Misty, MD;  Location: [facility] CATH LAB;  Service: Cardiovascular;  Laterality: Left;  . Left heart catheterization with coronary angiogram N/A 10/01/2012    Procedure: LEFT HEART CATHETERIZATION WITH CORONARY ANGIOGRAM;  Surgeon: Peter M Martinique, MD;  Location: [facility] CATH LAB;  Service: Cardiovascular;  Laterality: N/A;    ROS: Complete review of systems performed and found to be negative unless outlined above  PHYSICAL EXAM Pulse 61  Ht 5\' 1"  (1.549 m)  Wt 106 lb (48.081 kg)  BMI 20.04 kg/m2  SpO2 96%  LMP 12/28/2010 General: Well developed, well nourished, in no acute distress Head: Eyes PERRLA, No xanthomas.   Normal cephalic and atramatic  Lungs: Clear bilaterally to auscultation and percussion. Heart: HRRR S1 S2, bradycardic without MRG.  Pulses are 2+ & equal.            No carotid bruit. [name].  No abdominal bruits. No femoral bruits. Abdomen: Bowel sounds are positive, abdomen soft and non-tender without masses or                  Hernia's noted. Msk:  Back normal,slow gait. Normal strength and tone for age.Prosthetic left leg. Extremities: No clubbing, cyanosis or edema.  DP +1 Neuro: Alert and oriented X 3. Psych:  Good affect, responds appropriately   ASSESSMENT AND PLAN

## 2014-02-12 NOTE — Progress Notes (Deleted)
Name: [name]    DOB: 1970/12/17  Age: 44 y.o.  MR#: [id number]       PCP:  Alonza Bogus, MD      Insurance: Payor: MEDICAID [place] / Plan: MEDICAID [place] ACCESS / Product Type: *No Product type* /   CC:    Chief Complaint  Patient presents with  . Coronary Artery Disease  . Cardiomyopathy    VS Filed Vitals:   02/12/14 1415  BP: 112/62  Pulse: 61  Height: 5\' 1"  (1.549 m)  Weight: 106 lb (48.081 kg)  SpO2: 96%    Weights Current Weight  02/12/14 106 lb (48.081 kg)  02/20/13 130 lb (58.968 kg)  02/12/13 121 lb (54.885 kg)    Blood Pressure  BP Readings from Last 3 Encounters:  02/12/14 112/62  02/20/13 147/65  02/12/13 156/69     Admit date:  (Not on file) Last encounter with RMR:  12/17/2013   Allergy Ibuprofen and Losartan  Current Outpatient Prescriptions  Medication Sig Dispense Refill  . ALPRAZolam (XANAX) 1 MG tablet Take 1 mg by mouth 3 (three) times daily as needed for anxiety.    Marland Kitchen aspirin 81 MG chewable tablet Chew 1 tablet (81 mg total) by mouth daily.    . insulin aspart (NOVOLOG FLEXPEN) 100 UNIT/ML injection Inject 2-10 Units into the skin 3 (three) times daily before meals. Sliding scale as follows 200-250=2 units 251-300=4 units 301-350=6 units 351-400=8 units >400-give 10 units and Notify MD    . Insulin Glargine (LANTUS SOLOSTAR) 100 UNIT/ML SOPN Inject 15 Units into the skin at bedtime.    . metoprolol tartrate (LOPRESSOR) 25 MG tablet TAKE ONE TABLET BY MOUTH TWICE A DAY 60 tablet 0  . nitroGLYCERIN (NITROSTAT) 0.4 MG SL tablet Place 1 tablet (0.4 mg total) under the tongue every 5 (five) minutes as needed for chest pain. 25 tablet 5  . ondansetron (ZOFRAN) 4 MG tablet Take 4 mg by mouth every 8 (eight) hours as needed for nausea or vomiting.    Marland Kitchen oxyCODONE-acetaminophen (PERCOCET) 10-325 MG per tablet Take 1 tablet by mouth 4 (four) times daily.    . [DISCONTINUED] losartan (COZAAR) 25 MG tablet Take 12.5 mg by mouth daily.     No  current facility-administered medications for this visit.    Discontinued Meds:    Medications Discontinued During This Encounter  Medication Reason  . atorvastatin (LIPITOR) 80 MG tablet Discontinued by provider  . clopidogrel (PLAVIX) 75 MG tablet Discontinued by provider  . omeprazole (PRILOSEC) 20 MG capsule Discontinued by provider  . PARoxetine (PAXIL) 20 MG tablet Discontinued by provider  . promethazine (PHENERGAN) 25 MG tablet Discontinued by provider    Patient Active Problem List   Diagnosis Date Noted  . Coronary atherosclerosis of native coronary artery 02/20/2013  . Cardiomyopathy, ischemic 02/20/2013  . History of stroke 04/13/2011  . Thromboembolism of lower extremity artery 02/09/2011  . Peripheral arterial disease   . Hyperlipidemia   . Tobacco abuse   . Insulin dependent diabetes mellitus with complications [id number]    LABS    Component Value Date/Time   NA 133* 02/12/2013 0538   NA 133* 01/07/2013 2230   NA 136 10/03/2012 0415   K 3.8 02/12/2013 0538   K 4.1 01/07/2013 2230   K 3.9 10/03/2012 0415   CL 99 02/12/2013 0538   CL 93* 01/07/2013 2230   CL 106 10/03/2012 0415   CO2 21 02/12/2013 0538   CO2 24 01/07/2013 2230  CO2 21 10/03/2012 0415   GLUCOSE 431* 02/12/2013 0538   GLUCOSE 473* 01/07/2013 2230   GLUCOSE 114* 10/03/2012 0415   BUN 14 02/12/2013 0538   BUN 14 01/07/2013 2230   BUN 7 10/03/2012 0415   CREATININE 0.88 02/12/2013 0538   CREATININE 1.46* 01/07/2013 2230   CREATININE 0.66 10/03/2012 0415   CALCIUM 8.5 02/12/2013 0538   CALCIUM 9.2 01/07/2013 2230   CALCIUM 8.1* 10/03/2012 0415   GFRNONAA 79* 02/12/2013 0538   GFRNONAA 43* 01/07/2013 2230   GFRNONAA >90 10/03/2012 0415   GFRAA >90 02/12/2013 0538   GFRAA 50* 01/07/2013 2230   GFRAA >90 10/03/2012 0415   CMP     Component Value Date/Time   NA 133* 02/12/2013 0538   K 3.8 02/12/2013 0538   CL 99 02/12/2013 0538   CO2 21 02/12/2013 0538   GLUCOSE 431* 02/12/2013  0538   BUN 14 02/12/2013 0538   CREATININE 0.88 02/12/2013 0538   CALCIUM 8.5 02/12/2013 0538   PROT 5.8* 10/01/2012 0350   ALBUMIN 2.1* 10/01/2012 0350   AST 68* 10/01/2012 0350   ALT 18 10/01/2012 0350   ALKPHOS 79 10/01/2012 0350   BILITOT 0.1* 10/01/2012 0350   GFRNONAA 79* 02/12/2013 0538   GFRAA >90 02/12/2013 0538       Component Value Date/Time   WBC 9.9 02/12/2013 0538   WBC 7.7 01/07/2013 2230   WBC 6.6 10/04/2012 0450   HGB 11.8* 02/12/2013 0538   HGB 13.8 01/07/2013 2230   HGB 10.5* 10/04/2012 0450   HCT 34.4* 02/12/2013 0538   HCT 41.1 01/07/2013 2230   HCT 31.3* 10/04/2012 0450   MCV 86.4 02/12/2013 0538   MCV 87.8 01/07/2013 2230   MCV 87.2 10/04/2012 0450    Lipid Panel     Component Value Date/Time   CHOL * 03/30/2009 0547    228        ATP III CLASSIFICATION:  <200     mg/dL   Desirable  200-239  mg/dL   Borderline High  >=240    mg/dL   High          TRIG 194* 03/30/2009 0547   HDL 45 03/30/2009 0547   CHOLHDL 5.1 03/30/2009 0547   VLDL 39 03/30/2009 0547   LDLCALC * 03/30/2009 0547    144        Total Cholesterol/HDL:CHD Risk Coronary Heart Disease Risk Table                     Men   Women  1/2 Average Risk   3.4   3.3  Average Risk       5.0   4.4  2 X Average Risk   9.6   7.1  3 X Average Risk  23.4   11.0        Use the calculated Patient Ratio above and the CHD Risk Table to determine the patient's CHD Risk.        ATP III CLASSIFICATION (LDL):  <100     mg/dL   Optimal  100-129  mg/dL   Near or Above                    Optimal  130-159  mg/dL   Borderline  160-189  mg/dL   High  >190     mg/dL   Very High    ABG    Component Value Date/Time   PHART 6.995* 09/30/2012 2133  PCO2ART 13.0* 09/30/2012 2133   PO2ART 97.9 09/30/2012 2133   HCO3 3.0* 09/30/2012 2133   TCO2 PENDING 09/30/2012 2133   ACIDBASEDEF 26.4* 09/30/2012 2133   O2SAT 94.6 09/30/2012 2133     Lab Results  Component Value Date   TSH 6.633*  04/14/2011   BNP (last 3 results) No results for input(s): PROBNP in the last 8760 hours. Cardiac Panel (last 3 results) No results for input(s): CKTOTAL, CKMB, TROPONINI, RELINDX in the last 72 hours.  Iron/TIBC/Ferritin/ %Sat No results found for: IRON, TIBC, FERRITIN, IRONPCTSAT   EKG Orders placed or performed in visit on 02/12/14  . EKG 12-Lead     Prior Assessment and Plan Problem List as of 02/12/2014      Cardiovascular and Mediastinum   Peripheral arterial disease   Thromboembolism of lower extremity artery   Last Assessment & Plan 02/20/2013 Office Visit Written 02/20/2013 10:46 AM by Satira Sark, MD    Patient has a history of prior embolectomy and ultimately left AKA. She was previously on Coumadin, this was discontinued by Dr. Martinique around the time of her cardiac intervention in September of last year. She was placed on DAPT at that time. We may continue DAPT as a long-term regimen going forward, although could consider going back anticoagulation after her year treatment.      Coronary atherosclerosis of native coronary artery   Last Assessment & Plan 02/20/2013 Office Visit Written 02/20/2013 10:44 AM by Satira Sark, MD    Symptomatically stable status post DES to the LAD in September 2014. I reinforced the importance of continuing aspirin and Plavix without interruption for a least a year from intervention. She states that she has been compliant. Continue observation, followup in 6 months.      Cardiomyopathy, ischemic   Last Assessment & Plan 02/20/2013 Office Visit Written 02/20/2013 10:45 AM by Satira Sark, MD    LVEF 40-45% as of last year. We will consider repeat echocardiogram around time of her next visit in may need to further optimize medical therapy.        Endocrine   Insulin dependent diabetes mellitus with complications   Last Assessment & Plan 02/20/2013 Office Visit Written 02/20/2013 10:45 AM by Satira Sark, MD    Keep followup with Dr.  Luan Pulling.        Other   Hyperlipidemia   Last Assessment & Plan 02/20/2013 Office Visit Written 02/20/2013 10:45 AM by Satira Sark, MD    She is tolerating high-dose Lipitor. Followup FLP and LFT.      Tobacco abuse   History of stroke       Imaging: No results found.

## 2014-09-11 LAB — BLOOD GAS, ARTERIAL
Acid-base deficit: 26.4 mmol/L — ABNORMAL HIGH (ref 0.0–2.0)
Bicarbonate: 3 mEq/L — ABNORMAL LOW (ref 20.0–24.0)
Drawn by: 38235
FIO2: 0.21
O2 Saturation: 94.6 %
Patient temperature: 37
pCO2 arterial: 13 mmHg — CL (ref 35.0–45.0)
pH, Arterial: 6.995 — CL (ref 7.350–7.450)
pO2, Arterial: 97.9 mmHg (ref 80.0–100.0)

## 2014-09-19 ENCOUNTER — Other Ambulatory Visit: Payer: Self-pay | Admitting: Obstetrics & Gynecology

## 2014-10-08 ENCOUNTER — Other Ambulatory Visit: Payer: Self-pay | Admitting: *Deleted

## 2014-10-08 DIAGNOSIS — I739 Peripheral vascular disease, unspecified: Secondary | ICD-10-CM

## 2014-10-23 ENCOUNTER — Ambulatory Visit: Payer: Medicaid Other | Admitting: Cardiology

## 2014-10-24 ENCOUNTER — Ambulatory Visit (INDEPENDENT_AMBULATORY_CARE_PROVIDER_SITE_OTHER): Payer: Medicaid Other | Admitting: Cardiology

## 2014-10-24 ENCOUNTER — Encounter: Payer: Self-pay | Admitting: Cardiology

## 2014-10-24 VITALS — BP 122/64 | HR 88 | Ht 61.0 in | Wt 112.4 lb

## 2014-10-24 DIAGNOSIS — I25119 Atherosclerotic heart disease of native coronary artery with unspecified angina pectoris: Secondary | ICD-10-CM

## 2014-10-24 DIAGNOSIS — I743 Embolism and thrombosis of arteries of the lower extremities: Secondary | ICD-10-CM | POA: Diagnosis not present

## 2014-10-24 DIAGNOSIS — I429 Cardiomyopathy, unspecified: Secondary | ICD-10-CM

## 2014-10-24 DIAGNOSIS — Z720 Tobacco use: Secondary | ICD-10-CM

## 2014-10-24 DIAGNOSIS — Z23 Encounter for immunization: Secondary | ICD-10-CM | POA: Diagnosis not present

## 2014-10-24 MED ORDER — METOPROLOL TARTRATE 25 MG PO TABS: 25.0000 mg | ORAL_TABLET | Freq: Two times a day (BID) | ORAL | Status: DC

## 2014-10-24 NOTE — Progress Notes (Signed)
Cardiology Office Note  Date: 10/24/2014   ID: [name], DOB 11/14/1970, MRN [id number]  PCP: Alonza Bogus, MD  Primary Cardiologist: Rozann Lesches, MD   Chief Complaint  Patient presents with  . Coronary Artery Disease  . Cardiomyopathy    History of Present Illness: [name] is a medically complex 44 y.o. female last seen in January by Ms. Lawrence NP. She presents with her mother and a caregiver today for follow-up. She states that she has been under a lot of stress, crying and upset intermittently, during these times has angina symptoms that require nitroglycerin. She also ran out of Lopressor about a week ago.  We went over her medications today. Echocardiogram from 2014 is outlined below. She has not had a follow-up assessment.  She has a history of prior embolectomy and ultimately left AKA. She was previously on Coumadin, this was discontinued by Dr. Martinique around the time of her cardiac intervention in September 2014. She was placed on DAPT at that time. She was placed on Eliquis by Dr. Luan Pulling at some point within the last year, now off aspirin. She denies any bleeding problems and states that she has had lab work with Dr. Luan Pulling in the interim.  She continues to struggle with smoking cessation, has not been able to quit, although intermittently cuts back.  Past Medical History  Diagnosis Date  . Shingles     11/2010  . Facial cellulitis     12/2010  . Hyperlipidemia   . Headache(784.0)   . Glomerulonephritis   . Anxiety   . Depression   . Peripheral arterial disease ([place])   . Insulin dependent diabetes mellitus ([place])     History of diabetic ketoacidosis  . Arterial thromboembolism ([place])     Prior embolectomy, previously on Coumadin  . Coronary atherosclerosis of native coronary artery     DES to LAD September 2014  . History of stroke   . ST elevation myocardial infarction (STEMI) of anterior wall [facility])     Late presentation September 2014    . Ischemic cardiomyopathy     LVEF 40-45%    Past Surgical History  Procedure Laterality Date  . Wrist surgery      Left  . Embolectomy  01/29/2011    Procedure: EMBOLECTOMY;  Surgeon: Mal Misty, MD;  Location: [facility] OR;  Service: Vascular;  Laterality: Left;  Left Popliteal and Tibial Embolectomy with patch angioplasty  . Dilation and curettage of uterus    . Multiple tooth extractions    . Amputation  03/03/2011    Procedure: AMPUTATION DIGIT;  Surgeon: Newt Minion, MD;  Location: [facility];  Service: Orthopedics;  Laterality: Left;  Left Foot Amputation 4th and 5th toes at MTP joint  . Tee without cardioversion  04/15/2011    Procedure: TRANSESOPHAGEAL ECHOCARDIOGRAM (TEE);  Surgeon: [name], MD;  [facility];  Service: Cardiovascular;  Laterality: N/A;  . Amputation  04/22/2011    Procedure: AMPUTATION BELOW KNEE;  Surgeon: Newt Minion, MD;  Location: [place];  Service: Orthopedics;  Laterality: Left;  Left Below Knee Amputation  . Lower extremity angiogram N/A 01/27/2011    Procedure: LOWER EXTREMITY ANGIOGRAM;  Surgeon: Rosetta Posner, MD;  Location: [facility] CATH LAB;  Service: Cardiovascular;  Laterality: N/A;  . Lower extremity angiogram N/A 01/28/2011    Procedure: LOWER EXTREMITY ANGIOGRAM;  Surgeon: Conrad Esparto, MD;  Location: [facility] CATH LAB;  Service: Cardiovascular;  Laterality: N/A;  .  Lower extremity angiogram Left 01/29/2011    Procedure: LOWER EXTREMITY ANGIOGRAM;  Surgeon: Mal Misty, MD;  Location: [facility] CATH LAB;  Service: Cardiovascular;  Laterality: Left;  . Left heart catheterization with coronary angiogram N/A 10/01/2012    Procedure: LEFT HEART CATHETERIZATION WITH CORONARY ANGIOGRAM;  Surgeon: Peter M Martinique, MD;  Location: [facility] CATH LAB;  Service: Cardiovascular;  Laterality: N/A;    Current Outpatient Prescriptions  Medication Sig Dispense Refill  . ALPRAZolam (XANAX) 1 MG tablet Take 1 mg by mouth 3 (three) times daily as needed for anxiety.    Marland Kitchen  apixaban (ELIQUIS) 5 MG TABS tablet Take 5 mg by mouth 2 (two) times daily.    . insulin aspart (NOVOLOG FLEXPEN) 100 UNIT/ML injection Inject 2-10 Units into the skin 3 (three) times daily before meals. Sliding scale as follows 200-250=2 units 251-300=4 units 301-350=6 units 351-400=8 units >400-give 10 units and Notify MD    . Insulin Glargine (LANTUS SOLOSTAR) 100 UNIT/ML SOPN Inject 15 Units into the skin at bedtime.    . nitroGLYCERIN (NITROSTAT) 0.4 MG SL tablet Place 1 tablet (0.4 mg total) under the tongue every 5 (five) minutes as needed for chest pain. 25 tablet 5  . oxyCODONE-acetaminophen (PERCOCET) 10-325 MG per tablet Take 1 tablet by mouth 4 (four) times daily.    . metoprolol tartrate (LOPRESSOR) 25 MG tablet Take 1 tablet (25 mg total) by mouth 2 (two) times daily. 180 tablet 3  . [DISCONTINUED] losartan (COZAAR) 25 MG tablet Take 12.5 mg by mouth daily.     No current facility-administered medications for this visit.    Allergies:  Ibuprofen and Losartan   Social History: The patient  reports that she has been passively smoking Cigarettes.  She started smoking about 25 years ago. She has a 15.75 pack-year smoking history. She has never used smokeless tobacco. She reports that she uses illicit drugs (Marijuana) about once per week. She reports that she does not drink alcohol.   ROS:  Please see the history of present illness. Otherwise, complete review of systems is positive for stress and anxiety.  All other systems are reviewed and negative.   Physical Exam: VS:  BP 122/64 mmHg  Pulse 88  Ht 5\' 1"  (1.549 m)  Wt 112 lb 6.4 oz (50.984 kg)  BMI 21.25 kg/m2  SpO2 98%  LMP 12/28/2010, BMI Body mass index is 21.25 kg/(m^2).  Wt Readings from Last 3 Encounters:  10/24/14 112 lb 6.4 oz (50.984 kg)  02/12/14 106 lb (48.081 kg)  02/20/13 130 lb (58.968 kg)     Chronically ill-appearing woman seated in a wheelchair. HEENT: Conjunctiva and lids normal, oropharynx clear  poor dentition. Neck: Supple, no elevated JVP or carotid bruits, no thyromegaly. Lungs: Decreased breath sounds without wheezing, nonlabored breathing at rest. Cardiac: Regular rate and rhythm, no S3 or significant systolic murmur, no pericardial rub. Abdomen: Soft, nontender, bowel sounds present. Extremities: Status post left BKA, no edema on right. Skin: Warm and dry. Musculoskeletal: No kyphosis. Neuropsychiatric: Alert and oriented x3, affect grossly appropriate.   ECG: Tracing from 02/13/2014 showed normal sinus rhythm with rightward axis and evidence of old anterior infarct.  Other Studies Reviewed Today:  Echocardiogram 10/01/2012: Study Conclusions  - Left ventricle: Technically difficult study. The cavity size was normal. Wall thickness was increased in a pattern of moderate LVH. Systolic function was mildly to moderately reduced. The estimated ejection fraction was in the range of 40% to 45%. There was an increased relative contribution  of atrial contraction to ventricular filling. Doppler parameters are consistent with abnormal left ventricular relaxation (grade 1 diastolic dysfunction). Doppler parameters are consistent with high ventricular filling pressure. - Regional wall motion abnormality: Severe hypokinesis of the mid anterior, apical septal, and apical myocardium; hypokinesis of the basal anteroseptal myocardium; moderate hypokinesis of the apical anterior, mid anteroseptal, mid inferoseptal, and apical inferior myocardium. - Mitral valve: Calcified annulus. Mildly thickened leaflets . Mild posterior leaflet prolapse. Trivial regurgitation. - Left atrium: The atrium was mildly dilated.  ASSESSMENT AND PLAN:  1. CAD status post DES to the LAD in September 2014. LVEF 40-45% at that time. She is experiencing angina symptoms with emotional upset, also out of Lopressor over the last week. We will refill Lopressor at 25 mg twice daily for  now, could always consider long-acting nitrate if necessary. Otherwise continue current regimen and reestablish more regular follow-up. We will obtain an echocardiogram to reassess LVEF.  2. Ongoing tobacco abuse, has been very difficult for her to quit. We did discuss different strategies.  3. History of peripheral arterial embolectomy and ultimately left AKA, now back on Eliquis per Dr. Luan Pulling. She is off anti-platelet regimen.  Current medicines were reviewed at length with the patient today.   Orders Placed This Encounter  Procedures  . Flu Vaccine QUAD 36+ mos IM  . Echocardiogram    Disposition: FU with me in 6 months.   Signed, Satira Sark, MD, [facility] 10/24/2014 11:55 AM    [facility]. [address], [place], [place] [address] Phone: [phone]; Fax: [phone]

## 2014-10-24 NOTE — Patient Instructions (Signed)
Your physician wants you to follow-up in: 6 months with Dr Ferne Reus will receive a reminder letter in the mail two months in advance. If you don't receive a letter, please call our office to schedule the follow-up appointment.    INCREASE Lopressor to 25 mg twice a day   Your physician has requested that you have an echocardiogram. Echocardiography is a painless test that uses sound waves to create images of your heart. It provides your doctor with information about the size and shape of your heart and how well your heart's chambers and valves are working. This procedure takes approximately one hour. There are no restrictions for this procedure.     Thank you for choosing [facility] !

## 2014-10-29 ENCOUNTER — Encounter (HOSPITAL_COMMUNITY): Payer: Medicaid Other

## 2014-10-30 ENCOUNTER — Other Ambulatory Visit: Payer: Self-pay | Admitting: Vascular Surgery

## 2014-10-30 ENCOUNTER — Ambulatory Visit (HOSPITAL_COMMUNITY)
Admission: RE | Admit: 2014-10-30 | Discharge: 2014-10-30 | Disposition: A | Discharge status: Home / Self Care | Payer: Medicaid Other | Source: Ambulatory Visit | Attending: Cardiology | Admitting: Cardiology

## 2014-10-30 DIAGNOSIS — E785 Hyperlipidemia, unspecified: Secondary | ICD-10-CM | POA: Diagnosis not present

## 2014-10-30 DIAGNOSIS — E119 Type 2 diabetes mellitus without complications: Secondary | ICD-10-CM | POA: Insufficient documentation

## 2014-10-30 DIAGNOSIS — I25119 Atherosclerotic heart disease of native coronary artery with unspecified angina pectoris: Secondary | ICD-10-CM | POA: Diagnosis not present

## 2014-10-30 DIAGNOSIS — I429 Cardiomyopathy, unspecified: Secondary | ICD-10-CM | POA: Insufficient documentation

## 2014-10-30 DIAGNOSIS — Z794 Long term (current) use of insulin: Secondary | ICD-10-CM | POA: Diagnosis not present

## 2014-10-30 DIAGNOSIS — I739 Peripheral vascular disease, unspecified: Secondary | ICD-10-CM

## 2014-10-31 ENCOUNTER — Encounter: Payer: Self-pay | Admitting: Vascular Surgery

## 2014-10-31 ENCOUNTER — Other Ambulatory Visit: Payer: Self-pay | Admitting: Vascular Surgery

## 2014-10-31 ENCOUNTER — Ambulatory Visit (HOSPITAL_COMMUNITY)
Admission: RE | Admit: 2014-10-31 | Discharge: 2014-10-31 | Disposition: A | Discharge status: Home / Self Care | Payer: Medicaid Other | Source: Ambulatory Visit | Attending: Vascular Surgery | Admitting: Vascular Surgery

## 2014-10-31 DIAGNOSIS — I739 Peripheral vascular disease, unspecified: Secondary | ICD-10-CM

## 2014-10-31 DIAGNOSIS — Z48812 Encounter for surgical aftercare following surgery on the circulatory system: Secondary | ICD-10-CM | POA: Diagnosis not present

## 2014-10-31 DIAGNOSIS — Z89512 Acquired absence of left leg below knee: Secondary | ICD-10-CM | POA: Diagnosis not present

## 2014-10-31 DIAGNOSIS — E785 Hyperlipidemia, unspecified: Secondary | ICD-10-CM | POA: Diagnosis not present

## 2014-10-31 DIAGNOSIS — I2510 Atherosclerotic heart disease of native coronary artery without angina pectoris: Secondary | ICD-10-CM | POA: Diagnosis not present

## 2014-11-05 ENCOUNTER — Ambulatory Visit: Payer: Medicaid Other | Admitting: Vascular Surgery

## 2014-11-08 ENCOUNTER — Encounter: Payer: Self-pay | Admitting: Vascular Surgery

## 2014-11-12 ENCOUNTER — Encounter: Payer: Self-pay | Admitting: Vascular Surgery

## 2014-11-12 ENCOUNTER — Ambulatory Visit (INDEPENDENT_AMBULATORY_CARE_PROVIDER_SITE_OTHER): Payer: Medicaid Other | Admitting: Vascular Surgery

## 2014-11-12 VITALS — BP 133/72 | HR 71 | Temp 98.0°F | Ht 61.0 in | Wt 112.0 lb

## 2014-11-12 DIAGNOSIS — I739 Peripheral vascular disease, unspecified: Secondary | ICD-10-CM | POA: Diagnosis not present

## 2014-11-12 DIAGNOSIS — Z48812 Encounter for surgical aftercare following surgery on the circulatory system: Secondary | ICD-10-CM | POA: Diagnosis not present

## 2014-11-12 NOTE — Progress Notes (Signed)
Subjective:     Patient ID: [name], female   DOB: [birth year], 44 y.o.   MRN: [id number]  HPI This 44 year old female is referred by her podiatrist for evaluation of circulation right foot. Patient is known to me having undergone extensive thrombectomy after an embolus to her left leg a few years ago which eventually resulted in left below-knee dictation. She was on Coumadin therapy but 2 years ago had a myocardial infarction and now is on Eliquis. She had a problem with her right first toenail and recently saw the podiatrist to partially remov the nail. She is concerned about whether this will heal. She has some symptoms in the right foot which she describes as some pain and some numbness. She has phantom pain in her left below-knee dictation stopped. She does not use a prosthesis at the present time. She continues to smoke approximately 1 pack of cigarettes per day. She does have diabetes mellitus.  Past Medical History  Diagnosis Date  . Shingles     11/2010  . Facial cellulitis     12/2010  . Hyperlipidemia   . Headache(784.0)   . Glomerulonephritis   . Anxiety   . Depression   . Peripheral arterial disease ([place])   . Insulin dependent diabetes mellitus ([place])     History of diabetic ketoacidosis  . Arterial thromboembolism ([place])     Prior embolectomy, previously on Coumadin  . Coronary atherosclerosis of native coronary artery     DES to LAD September 2014  . History of stroke   . ST elevation myocardial infarction (STEMI) of anterior wall [facility])     Late presentation September 2014  . Ischemic cardiomyopathy     LVEF 40-45%    Social History  Substance Use Topics  . Smoking status: Passive Smoke Exposure - Never Smoker -- 0.75 packs/day for 21 years    Types: Cigarettes    Start date: 07/06/1989    Last Attempt to Quit: 01/30/2011  . Smokeless tobacco: Never Used  . Alcohol Use: No    Family History  Problem Relation Age of Onset  . COPD Mother     alive - 4  .  Hypertension Mother   . Diabetes Mother   . Stroke Mother   . Coronary artery disease Mother   . Diabetes Father     alive - 48  . Hypertension Father   . Coronary artery disease Father   . Anesthesia problems Neg Hx     Allergies  Allergen Reactions  . Ibuprofen Other (See Comments)    Kidney disease  . Losartan Swelling     Current outpatient prescriptions:  .  ALPRAZolam (XANAX) 1 MG tablet, Take 1 mg by mouth 3 (three) times daily as needed for anxiety., Disp: , Rfl:  .  apixaban (ELIQUIS) 5 MG TABS tablet, Take 5 mg by mouth 2 (two) times daily., Disp: , Rfl:  .  insulin aspart (NOVOLOG FLEXPEN) 100 UNIT/ML injection, Inject 2-10 Units into the skin 3 (three) times daily before meals. Sliding scale as follows 200-250=2 units 251-300=4 units 301-350=6 units 351-400=8 units >400-give 10 units and Notify MD, Disp: , Rfl:  .  Insulin Glargine (LANTUS SOLOSTAR) 100 UNIT/ML SOPN, Inject 15 Units into the skin at bedtime., Disp: , Rfl:  .  metoprolol tartrate (LOPRESSOR) 25 MG tablet, Take 1 tablet (25 mg total) by mouth 2 (two) times daily., Disp: 180 tablet, Rfl: 3 .  nitroGLYCERIN (NITROSTAT) 0.4 MG SL tablet, Place 1 tablet (0.4  mg total) under the tongue every 5 (five) minutes as needed for chest pain., Disp: 25 tablet, Rfl: 5 .  oxyCODONE-acetaminophen (PERCOCET) 10-325 MG per tablet, Take 1 tablet by mouth 4 (four) times daily., Disp: , Rfl:  .  [DISCONTINUED] losartan (COZAAR) 25 MG tablet, Take 12.5 mg by mouth daily., Disp: , Rfl:   Filed Vitals:   11/12/14 1610  BP: 133/72  Pulse: 71  Temp: 98 F (36.7 C)  TempSrc: Oral  Height: 5\' 1"  (1.549 m)  Weight: 112 lb (50.803 kg)  SpO2: 98%    Body mass index is 21.17 kg/(m^2).           Review of Systems Denies active chest pain, dyspnea on exertion, PND, orthopnea. Does have history of headaches, myocardial infrction in 2 years ago, diabetes mellitus, tobacco abuse. Other systems negative and complete review of  systms     Objective:   Physical Exam BP 133/72 mmHg  Pulse 71  Temp(Src) 98 F (36.7 C) (Oral)  Ht 5\' 1"  (1.549 m)  Wt 112 lb (50.803 kg)  BMI 21.17 kg/m2  SpO2 98%  LMP 12/28/2010  Gen.-alert and oriented x3 in no apparent distress -in a wheelchair-left below-knee  amputation HEENT normal for age Lungs no rhonchi or wheezing Cardiovascular regular rhythm no murmurs carotid pulses 3+ palpable no bruits audible Abdomen soft nontender no palpable masses Musculoskeletal free of  major deformities Skin clear -no rashes Neurologic normal Lower extremities  Right leg with 3+ femoral pulse palpable. No popliteal or distal pulses palpable right foot. There is a  Hypertrophic nail on the first nailbed which is uninfected and not draining with no erythema.    patient had lower extremity arterial Doppler studies performed 10/31/2014 in our office. Patient has monophasic flow in the right foot with ABI of 0.54 in the PT and digital flow in the first toe of 0.31. 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 She does not ambulate and she is not having trouble sleeping. She has no nonhealing ulcers or infection currently.   I discussed this with her and if she feels that her pain is worsening others any evidence of infection will proceed with angiography with possible PTA and stenting right superficial femoral artery if feasible If not she'll return in 3-4 months with repeat ABIs and duplex scan of right leg to look at her anatomy  she continues to smoke and  I suspect her long-term prognosis with her right leg is not good  She will call us if symptoms worsen before the 3-4 month follow-up

## 2014-11-13 DIAGNOSIS — Z0279 Encounter for issue of other medical certificate: Secondary | ICD-10-CM | POA: Diagnosis not present

## 2014-11-13 NOTE — Addendum Note (Signed)
Addended by: Dorthula Rue L on: 11/13/2014 03:51 PM   Modules accepted: Orders

## 2014-11-13 NOTE — Addendum Note (Signed)
Addended by: Mena Goes on: 11/13/2014 02:36 PM   Modules accepted: Orders

## 2015-02-18 ENCOUNTER — Other Ambulatory Visit: Payer: Self-pay | Admitting: Women's Health

## 2015-02-19 ENCOUNTER — Encounter: Payer: Self-pay | Admitting: Vascular Surgery

## 2015-02-25 ENCOUNTER — Ambulatory Visit (INDEPENDENT_AMBULATORY_CARE_PROVIDER_SITE_OTHER)
Admission: RE | Admit: 2015-02-25 | Discharge: 2015-02-25 | Disposition: A | Discharge status: Home / Self Care | Payer: Medicaid Other | Source: Ambulatory Visit | Attending: Vascular Surgery | Admitting: Vascular Surgery

## 2015-02-25 ENCOUNTER — Ambulatory Visit (HOSPITAL_COMMUNITY)
Admission: RE | Admit: 2015-02-25 | Discharge: 2015-02-25 | Disposition: A | Discharge status: Home / Self Care | Payer: Medicaid Other | Source: Ambulatory Visit | Attending: Vascular Surgery | Admitting: Vascular Surgery

## 2015-02-25 ENCOUNTER — Encounter: Payer: Self-pay | Admitting: Vascular Surgery

## 2015-02-25 ENCOUNTER — Ambulatory Visit (INDEPENDENT_AMBULATORY_CARE_PROVIDER_SITE_OTHER): Payer: Medicaid Other | Admitting: Vascular Surgery

## 2015-02-25 VITALS — BP 124/72 | HR 90 | Temp 97.6°F | Resp 16 | Ht 61.0 in | Wt 115.0 lb

## 2015-02-25 DIAGNOSIS — R938 Abnormal findings on diagnostic imaging of other specified body structures: Secondary | ICD-10-CM | POA: Diagnosis not present

## 2015-02-25 DIAGNOSIS — E785 Hyperlipidemia, unspecified: Secondary | ICD-10-CM | POA: Insufficient documentation

## 2015-02-25 DIAGNOSIS — Z48812 Encounter for surgical aftercare following surgery on the circulatory system: Secondary | ICD-10-CM

## 2015-02-25 DIAGNOSIS — I739 Peripheral vascular disease, unspecified: Secondary | ICD-10-CM

## 2015-02-25 DIAGNOSIS — E119 Type 2 diabetes mellitus without complications: Secondary | ICD-10-CM | POA: Insufficient documentation

## 2015-02-25 DIAGNOSIS — R0989 Other specified symptoms and signs involving the circulatory and respiratory systems: Secondary | ICD-10-CM | POA: Insufficient documentation

## 2015-02-25 DIAGNOSIS — M79671 Pain in right foot: Secondary | ICD-10-CM | POA: Diagnosis not present

## 2015-02-25 NOTE — Progress Notes (Signed)
Subjective:     Patient ID: [name], female   DOB: Mar 27, 1970, 45 y.o.   MRN: [id number]  HPI This 45 year old female returns for continued follow-up regarding her lower extremity arterial disease the right leg. She has a remote history of a left leg amputation. She continues to complain of pain in the right foot primarily. She continues to smoke between one half and 1 pack of cigarettes per day. She has no history of gangrene, ulceration, infection or other problems with the right foot other than pain.  Past Medical History  Diagnosis Date  . Shingles     11/2010  . Facial cellulitis     12/2010  . Hyperlipidemia   . Headache(784.0)   . Glomerulonephritis   . Anxiety   . Depression   . Peripheral arterial disease ([place])   . Insulin dependent diabetes mellitus ([place])     History of diabetic ketoacidosis  . 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 Coronary atherosclerosis of native coronary artery     DES to LAD September 2014  . History of stroke   . ST elevation myocardial infarction (STEMI) of anterior wall [facility])     Late presentation September 2014  . Ischemic cardiomyopathy     LVEF 40-45%    Social History  Substance Use Topics  . Smoking status: Passive Smoke Exposure - Never Smoker -- 0.75 packs/day for 21 years    Types: Cigarettes    Start date: 07/06/1989    Last Attempt to Quit: 01/30/2011  . Smokeless tobacco: Never Used  . Alcohol Use: No    Family History  Problem Relation Age of Onset  . COPD Mother     alive - 34  . Hypertension Mother   . Diabetes Mother   . Stroke Mother   . Coronary artery disease Mother   . Diabetes Father     alive - 52  . Hypertension Father   . Coronary artery disease Father   . Anesthesia problems Neg Hx     Allergies  Allergen Reactions  . Ibuprofen Other (See Comments)    Kidney disease  . Losartan Swelling     Current outpatient prescriptions:  .  ALPRAZolam (XANAX) 1  MG tablet, Take 1 mg by mouth 3 (three) times daily as needed for anxiety., Disp: , Rfl:  .  apixaban (ELIQUIS) 5 MG TABS tablet, Take 5 mg by mouth 2 (two) times daily., Disp: , Rfl:  .  insulin aspart (NOVOLOG FLEXPEN) 100 UNIT/ML injection, Inject 2-10 Units into the skin 3 (three) times daily before meals. Sliding scale as follows 200-250=2 units 251-300=4 units 301-350=6 units 351-400=8 units >400-give 10 units and Notify MD, Disp: , Rfl:  .  Insulin Glargine (LANTUS SOLOSTAR) 100 UNIT/ML SOPN, Inject 15 Units into the skin at bedtime., Disp: , Rfl:  .  metoprolol tartrate (LOPRESSOR) 25 MG tablet, Take 1 tablet (25 mg total) by mouth 2 (two) times daily., Disp: 180 tablet, Rfl: 3 .  nitroGLYCERIN (NITROSTAT) 0.4 MG SL tablet, Place 1 tablet (0.4 mg total) under the tongue every 5 (five) minutes as needed for chest pain., Disp: 25 tablet, Rfl: 5 .  oxyCODONE-acetaminophen (PERCOCET) 10-325 MG per tablet, Take 1 tablet by mouth 4 (four) times daily., Disp: , Rfl:  .  [DISCONTINUED] losartan (COZAAR) 25 MG tablet, Take 12.5 mg by mouth daily., Disp: , Rfl:   Filed Vitals:   02/25/15 1513  BP: 124/72  Pulse:  90  Temp: 97.6 F (36.4 C)  Resp: 16  Height: 5\' 1"  (1.549 m)  Weight: 115 lb (52.164 kg)  SpO2: 98%    Body mass index is 21.74 kg/(m^2).           Review of Systems Denies chest pain, dyspnea on exertion, PND, orthopnea, hemoptysis     Objective:   Physical Exam BP 124/72 mmHg  Pulse 90  Temp(Src) 97.6 F (36.4 C)  Resp 16  Ht 5\' 1"  (1.549 m)  Wt 115 lb (52.164 kg)  BMI 21.74 kg/m2  SpO2 98%  LMP 12/28/2010   Gen. Well-developed well-nourished female in no apparent distress alert and oriented 3  lungs no rhonchi or wheezing Right leg with 3+ femoral 2+ popliteal pulse palpable. No distal pulses palpable. Right foot is adequately perfused with no evidence of gangrene infection or ulceration.   ordered a  Right leg ultrasound with ABIs. ABI today is 0.7 to  compare to 0.5 for previous study in October 2016. There is biphasic throw through the popliteal artery with a mild stenosis in the mid superficial femoral artery. Tibial vessels all have monophasic flow.     Assessment:      suspect a lot of her pain in the right leg is de to neuropathy. Patient has evidence of tibial occlusive disease but ABIs satisfactory at 0.7 to Ongoing tobacco abuse        Plan:      nothing to offer from a vascular standpoint. Have again stressed the importance of tobacco cessation.   believe most of her symptoms are due to neuropathy with chronic disease Return to see [name] on when necessary basis

## 2015-02-26 ENCOUNTER — Other Ambulatory Visit: Payer: Self-pay | Admitting: Women's Health

## 2015-03-17 ENCOUNTER — Other Ambulatory Visit: Payer: Self-pay | Admitting: Women's Health

## 2015-03-24 ENCOUNTER — Other Ambulatory Visit: Payer: Self-pay | Admitting: Women's Health

## 2015-07-09 ENCOUNTER — Other Ambulatory Visit (HOSPITAL_COMMUNITY): Payer: Self-pay | Admitting: Pulmonary Disease

## 2015-07-09 DIAGNOSIS — N63 Unspecified lump in unspecified breast: Secondary | ICD-10-CM

## 2015-07-09 DIAGNOSIS — N6320 Unspecified lump in the left breast, unspecified quadrant: Secondary | ICD-10-CM

## 2015-07-09 DIAGNOSIS — N6310 Unspecified lump in the right breast, unspecified quadrant: Secondary | ICD-10-CM

## 2015-07-16 ENCOUNTER — Encounter (HOSPITAL_COMMUNITY): Payer: Medicaid Other

## 2015-10-29 ENCOUNTER — Other Ambulatory Visit (HOSPITAL_COMMUNITY): Payer: Self-pay | Admitting: Pulmonary Disease

## 2015-10-29 DIAGNOSIS — I739 Peripheral vascular disease, unspecified: Secondary | ICD-10-CM

## 2015-10-29 DIAGNOSIS — M79604 Pain in right leg: Secondary | ICD-10-CM

## 2015-10-30 ENCOUNTER — Ambulatory Visit (HOSPITAL_COMMUNITY)
Admission: RE | Admit: 2015-10-30 | Discharge: 2015-10-30 | Disposition: A | Discharge status: Home / Self Care | Payer: Medicaid Other | Source: Ambulatory Visit | Attending: Pulmonary Disease | Admitting: Pulmonary Disease

## 2015-10-30 DIAGNOSIS — I739 Peripheral vascular disease, unspecified: Secondary | ICD-10-CM | POA: Diagnosis not present

## 2015-10-30 DIAGNOSIS — M79604 Pain in right leg: Secondary | ICD-10-CM

## 2015-10-31 ENCOUNTER — Ambulatory Visit (HOSPITAL_COMMUNITY)
Admission: RE | Admit: 2015-10-31 | Discharge: 2015-10-31 | Disposition: A | Discharge status: Home / Self Care | Payer: Medicaid Other | Source: Ambulatory Visit | Attending: Pulmonary Disease | Admitting: Pulmonary Disease

## 2015-10-31 ENCOUNTER — Other Ambulatory Visit (HOSPITAL_COMMUNITY): Payer: Self-pay | Admitting: Pulmonary Disease

## 2015-10-31 DIAGNOSIS — M79604 Pain in right leg: Secondary | ICD-10-CM | POA: Diagnosis present

## 2015-10-31 DIAGNOSIS — I739 Peripheral vascular disease, unspecified: Secondary | ICD-10-CM

## 2015-11-11 ENCOUNTER — Encounter (HOSPITAL_COMMUNITY): Payer: Medicaid Other

## 2016-01-04 NOTE — Progress Notes (Deleted)
Cardiology Office Note  Date: 01/04/2016   ID: [name], DOB 02-09-1970, MRN [id number]  PCP: Alonza Bogus, MD  Primary Cardiologist: Rozann Lesches, MD   No chief complaint on file.   History of Present Illness: [name] is a 45 y.o. female last seen in October 2016.  She has a history of prior embolectomy and ultimately left AKA. She was previously on Coumadin, this was discontinued by Dr. Martinique around the time of her cardiac intervention in September 2014. She was placed on DAPT at that time. She was later placed on Eliquis by Dr. Luan Pulling.   Past Medical History:  Diagnosis Date  . Anxiety   . 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 Coronary atherosclerosis of native coronary artery    DES to LAD September 2014  . Depression   . Facial cellulitis    12/2010  . Glomerulonephritis   . Headache(784.0)   . History of stroke   . Hyperlipidemia   . Insulin dependent diabetes mellitus ([place])    History of diabetic ketoacidosis  . Ischemic cardiomyopathy    LVEF 40-45%  . Peripheral arterial disease ([place])   . Shingles    11/2010  . ST elevation myocardial infarction (STEMI) of anterior wall [facility])    Late presentation September 2014    Past Surgical History:  Procedure Laterality Date  . AMPUTATION  03/03/2011   Procedure: AMPUTATION DIGIT;  Surgeon: Newt Minion, MD;  Location: [place];  Service: Orthopedics;  Laterality: Left;  Left Foot Amputation 4th and 5th toes at MTP joint  . AMPUTATION  04/22/2011   Procedure: AMPUTATION BELOW KNEE;  Surgeon: Newt Minion, MD;  Location: [place];  Service: Orthopedics;  Laterality: Left;  Left Below Knee Amputation  . DILATION AND CURETTAGE OF UTERUS    . EMBOLECTOMY  01/29/2011   Procedure: EMBOLECTOMY;  Surgeon: Mal Misty, MD;  Location: [facility] OR;  Service: Vascular;  Laterality: Left;  Left Popliteal and Tibial Embolectomy with patch angioplasty  . LEFT HEART  CATHETERIZATION WITH CORONARY ANGIOGRAM N/A 10/01/2012   Procedure: LEFT HEART CATHETERIZATION WITH CORONARY ANGIOGRAM;  Surgeon: Peter M Martinique, MD;  Location: [facility] CATH LAB;  Service: Cardiovascular;  Laterality: N/A;  . LOWER EXTREMITY ANGIOGRAM N/A 01/27/2011   Procedure: LOWER EXTREMITY ANGIOGRAM;  Surgeon: Rosetta Posner, MD;  Location: [facility] CATH LAB;  Service: Cardiovascular;  Laterality: N/A;  . LOWER EXTREMITY ANGIOGRAM N/A 01/28/2011   Procedure: LOWER EXTREMITY ANGIOGRAM;  Surgeon: Conrad Butterfield, MD;  Location: [facility] CATH LAB;  Service: Cardiovascular;  Laterality: N/A;  . LOWER EXTREMITY ANGIOGRAM Left 01/29/2011   Procedure: LOWER EXTREMITY ANGIOGRAM;  Surgeon: Mal Misty, MD;  Location: [facility] CATH LAB;  Service: Cardiovascular;  Laterality: Left;  Marland Kitchen MULTIPLE TOOTH EXTRACTIONS    . TEE WITHOUT CARDIOVERSION  04/15/2011   Procedure: TRANSESOPHAGEAL ECHOCARDIOGRAM (TEE);  Surgeon: [name], MD;  [facility];  Service: Cardiovascular;  Laterality: N/A;  . WRIST SURGERY     Left    Current Outpatient Prescriptions  Medication Sig Dispense Refill  . ALPRAZolam (XANAX) 1 MG tablet Take 1 mg by mouth 3 (three) times daily as needed for anxiety.    Marland Kitchen apixaban (ELIQUIS) 5 MG TABS tablet Take 5 mg by mouth 2 (two) times daily.    . insulin aspart (NOVOLOG FLEXPEN) 100 UNIT/ML injection Inject 2-10 Units into the skin 3 (three) times daily before meals. Sliding  scale as follows 200-250=2 units 251-300=4 units 301-350=6 units 351-400=8 units >400-give 10 units and Notify MD    . Insulin Glargine (LANTUS SOLOSTAR) 100 UNIT/ML SOPN Inject 15 Units into the skin at bedtime.    . metoprolol tartrate (LOPRESSOR) 25 MG tablet Take 1 tablet (25 mg total) by mouth 2 (two) times daily. 180 tablet 3  . nitroGLYCERIN (NITROSTAT) 0.4 MG SL tablet Place 1 tablet (0.4 mg total) under the tongue every 5 (five) minutes as needed for chest pain. 25 tablet 5  . oxyCODONE-acetaminophen (PERCOCET)  10-325 MG per tablet Take 1 tablet by mouth 4 (four) times daily.     No current facility-administered medications for this visit.    Allergies:  Ibuprofen and Losartan   Social History: The patient  reports that she is a non-smoker but has been exposed to tobacco smoke. The exposure started about 26 years ago. She has been exposed to 0.75 packs per day for the past 21.00 years. She has never used smokeless tobacco. She reports that she uses drugs, including Marijuana, about 1 time per week. She reports that she does not drink alcohol.   Family History: The patient's family history includes COPD in her mother; Coronary artery disease in her father and mother; Diabetes in her father and mother; Hypertension in her father and mother; Stroke in her mother.   ROS:  Please see the history of present illness. Otherwise, complete review of systems is positive for {NONE DEFAULTED:18576::"none"}.  All other systems are reviewed and negative.   Physical Exam: VS:  LMP 12/28/2010 , BMI There is no height or weight on file to calculate BMI.  Wt Readings from Last 3 Encounters:  02/25/15 115 lb (52.2 kg)  11/12/14 112 lb (50.8 kg)  10/24/14 112 lb 6.4 oz (51 kg)    Chronically ill-appearing woman seated in a wheelchair. HEENT: Conjunctiva and lids normal, oropharynx clear poor dentition. Neck: Supple, no elevated JVP or carotid bruits, no thyromegaly. Lungs: Decreased breath sounds without wheezing, nonlabored breathing at rest. Cardiac: Regular rate and rhythm, no S3 or significant systolic murmur, no pericardial rub. Abdomen: Soft, nontender, bowel sounds present. Extremities: Status post left BKA, no edema on right. Skin: Warm and dry. Musculoskeletal: No kyphosis. Neuropsychiatric: Alert and oriented x3, affect grossly appropriate.  ECG: I personally reviewed the tracing from 02/13/2014 showed normal sinus rhythm with rightward axis and evidence of old anterior infarct.  Recent  Labwork:   Other Studies Reviewed Today:  Echocardiogram 10/30/2014: Study Conclusions  - Left ventricle: The cavity size was normal. Wall thickness was   normal. Systolic function was normal. The estimated ejection   fraction was in the range of 60% to 65%. Wall motion was normal;   there were no regional wall motion abnormalities. Doppler   parameters are consistent with abnormal left ventricular   relaxation (grade 1 diastolic dysfunction). - Aortic valve: Mildly calcified annulus. Trileaflet. - Mitral valve: Calcified annulus. Systolic bowing without   prolapse. - Left atrium: The atrium was at the upper limits of normal in   size. - Right atrium: Central venous pressure (est): 3 mm Hg. - Atrial septum: The septum bowed from left to right, consistent   with increased left atrial pressure. A patent foramen ovale   cannot be excluded. - Tricuspid valve: There was physiologic regurgitation. - Pericardium, extracardiac: There was no pericardial effusion.  Impressions:  - Normal LV wall thickness with LVEF 60-65% and grade 1 diastolic   dysfunction. Upper normal left atrial  chamber size. MAC with   systolic mitral bowling and trivial mitral regurgitation. Normal   RV contraction. There is bowing of the interatrial septum from   left to right consistent with increased pressure, cannot exclude   PFO. Since prior tracing in 2014, there has been normalization of   LVEF.  Assessment and Plan:   Current medicines were reviewed with the patient today.  No orders of the defined types were placed in this encounter.   Disposition:  Signed, Satira Sark, MD, [facility] 01/04/2016 6:56 PM    [facility] at [facility] [address] [place], [place] [address] Phone: [phone]; Fax: [phone]

## 2016-01-05 ENCOUNTER — Encounter: Payer: Self-pay | Admitting: Cardiology

## 2016-01-05 ENCOUNTER — Ambulatory Visit: Payer: Medicaid Other | Admitting: Cardiology

## 2016-01-08 ENCOUNTER — Other Ambulatory Visit: Payer: Self-pay

## 2016-01-08 DIAGNOSIS — I7092 Chronic total occlusion of artery of the extremities: Secondary | ICD-10-CM

## 2016-02-20 ENCOUNTER — Encounter: Payer: Self-pay | Admitting: Vascular Surgery

## 2016-02-25 ENCOUNTER — Encounter: Payer: Medicaid Other | Admitting: Vascular Surgery

## 2016-02-25 ENCOUNTER — Encounter (HOSPITAL_COMMUNITY): Payer: Medicaid Other

## 2016-03-15 ENCOUNTER — Encounter (HOSPITAL_COMMUNITY): Payer: Medicaid Other

## 2016-04-01 ENCOUNTER — Encounter: Payer: Self-pay | Admitting: Obstetrics and Gynecology

## 2016-04-01 ENCOUNTER — Observation Stay (HOSPITAL_COMMUNITY)
Admission: AD | Admit: 2016-04-01 | Discharge: 2016-04-02 | Disposition: A | Discharge status: Home / Self Care | Payer: Medicaid Other | Source: Ambulatory Visit | Attending: Obstetrics and Gynecology | Admitting: Obstetrics and Gynecology

## 2016-04-01 ENCOUNTER — Ambulatory Visit (INDEPENDENT_AMBULATORY_CARE_PROVIDER_SITE_OTHER): Payer: Medicaid Other | Admitting: Obstetrics and Gynecology

## 2016-04-01 VITALS — BP 128/70 | HR 92 | Wt 106.8 lb

## 2016-04-01 DIAGNOSIS — F17210 Nicotine dependence, cigarettes, uncomplicated: Secondary | ICD-10-CM | POA: Diagnosis not present

## 2016-04-01 DIAGNOSIS — I2510 Atherosclerotic heart disease of native coronary artery without angina pectoris: Secondary | ICD-10-CM | POA: Insufficient documentation

## 2016-04-01 DIAGNOSIS — N764 Abscess of vulva: Secondary | ICD-10-CM | POA: Diagnosis present

## 2016-04-01 DIAGNOSIS — E119 Type 2 diabetes mellitus without complications: Secondary | ICD-10-CM | POA: Diagnosis not present

## 2016-04-01 DIAGNOSIS — Z794 Long term (current) use of insulin: Secondary | ICD-10-CM | POA: Diagnosis not present

## 2016-04-01 DIAGNOSIS — E108 Type 1 diabetes mellitus with unspecified complications: Secondary | ICD-10-CM | POA: Diagnosis not present

## 2016-04-01 DIAGNOSIS — Z79899 Other long term (current) drug therapy: Secondary | ICD-10-CM | POA: Insufficient documentation

## 2016-04-01 LAB — CBC
HCT: 33.7 % — ABNORMAL LOW (ref 36.0–46.0)
Hemoglobin: 11.6 g/dL — ABNORMAL LOW (ref 12.0–15.0)
MCH: 29.9 pg (ref 26.0–34.0)
MCHC: 34.4 g/dL (ref 30.0–36.0)
MCV: 86.9 fL (ref 78.0–100.0)
Platelets: 436 10*3/uL — ABNORMAL HIGH (ref 150–400)
RBC: 3.88 MIL/uL (ref 3.87–5.11)
RDW: 14.7 % (ref 11.5–15.5)
WBC: 9.8 10*3/uL (ref 4.0–10.5)

## 2016-04-01 LAB — COMPREHENSIVE METABOLIC PANEL
ALT: 11 U/L — ABNORMAL LOW (ref 14–54)
AST: 18 U/L (ref 15–41)
Albumin: 2.3 g/dL — ABNORMAL LOW (ref 3.5–5.0)
Alkaline Phosphatase: 75 U/L (ref 38–126)
Anion gap: 7 (ref 5–15)
BUN: 23 mg/dL — ABNORMAL HIGH (ref 6–20)
CO2: 26 mmol/L (ref 22–32)
Calcium: 8.5 mg/dL — ABNORMAL LOW (ref 8.9–10.3)
Chloride: 95 mmol/L — ABNORMAL LOW (ref 101–111)
Creatinine, Ser: 1.37 mg/dL — ABNORMAL HIGH (ref 0.44–1.00)
GFR calc Af Amer: 53 mL/min — ABNORMAL LOW (ref >60)
GFR calc non Af Amer: 45 mL/min — ABNORMAL LOW (ref >60)
Glucose, Bld: 142 mg/dL — ABNORMAL HIGH (ref 65–99)
Potassium: 3.3 mmol/L — ABNORMAL LOW (ref 3.5–5.1)
Sodium: 128 mmol/L — ABNORMAL LOW (ref 135–145)
Total Bilirubin: 0.2 mg/dL — ABNORMAL LOW (ref 0.3–1.2)
Total Protein: 6.1 g/dL — ABNORMAL LOW (ref 6.5–8.1)

## 2016-04-01 LAB — GLUCOSE, CAPILLARY
Glucose-Capillary: 108 mg/dL — ABNORMAL HIGH (ref 65–99)
Glucose-Capillary: 249 mg/dL — ABNORMAL HIGH (ref 65–99)
Glucose-Capillary: 57 mg/dL — ABNORMAL LOW (ref 65–99)

## 2016-04-01 LAB — SEDIMENTATION RATE: Sed Rate: 84 mm/hr — ABNORMAL HIGH (ref 0–22)

## 2016-04-01 MED ORDER — OXYCODONE-ACETAMINOPHEN 5-325 MG PO TABS
1.0000 | ORAL_TABLET | Freq: Four times a day (QID) | ORAL | Status: DC
  Administered 2016-04-01 – 2016-04-02 (×2): 1 via ORAL
  Filled 2016-04-01 (×2): qty 1

## 2016-04-01 MED ORDER — OXYCODONE HCL 5 MG PO TABS
5.0000 mg | ORAL_TABLET | Freq: Four times a day (QID) | ORAL | Status: DC
  Administered 2016-04-01 – 2016-04-02 (×2): 5 mg via ORAL
  Filled 2016-04-01 (×2): qty 1

## 2016-04-01 MED ORDER — INSULIN ASPART 100 UNIT/ML ~~LOC~~ SOLN: 2.0000 [IU] | Freq: Three times a day (TID) | SUBCUTANEOUS | Status: DC

## 2016-04-01 MED ORDER — INSULIN GLARGINE 100 UNIT/ML ~~LOC~~ SOLN
20.0000 [IU] | Freq: Two times a day (BID) | SUBCUTANEOUS | Status: DC
  Administered 2016-04-01: 20 [IU] via SUBCUTANEOUS
  Filled 2016-04-01 (×8): qty 0.2

## 2016-04-01 MED ORDER — ENOXAPARIN SODIUM 40 MG/0.4ML ~~LOC~~ SOLN
40.0000 mg | SUBCUTANEOUS | Status: DC
  Administered 2016-04-01: 40 mg via SUBCUTANEOUS
  Filled 2016-04-01: qty 0.4

## 2016-04-01 MED ORDER — OXYCODONE-ACETAMINOPHEN 10-325 MG PO TABS: 1.0000 | ORAL_TABLET | Freq: Four times a day (QID) | ORAL | Status: DC

## 2016-04-01 MED ORDER — DOXYCYCLINE HYCLATE 100 MG PO TABS
100.0000 mg | ORAL_TABLET | Freq: Two times a day (BID) | ORAL | Status: DC
  Administered 2016-04-01 – 2016-04-02 (×2): 100 mg via ORAL
  Filled 2016-04-01 (×2): qty 1

## 2016-04-01 MED ORDER — ALPRAZOLAM 1 MG PO TABS
1.0000 mg | ORAL_TABLET | Freq: Three times a day (TID) | ORAL | Status: DC | PRN
  Administered 2016-04-01: 1 mg via ORAL
  Filled 2016-04-01: qty 1

## 2016-04-01 MED ORDER — NITROGLYCERIN 0.4 MG SL SUBL: 0.4000 mg | SUBLINGUAL_TABLET | SUBLINGUAL | Status: DC | PRN

## 2016-04-01 NOTE — H&P (Signed)
[name] is an 46 y.o. female. She is placed in observation as we assess her vulvar abscess and plan Incision and drainage.  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 She is very sore and has draining pinhole sites on the lateral aspects of the left labia majora, but also a chronic sinus just inferior to the left inguinal crease.  She is supposed to be on Eluquis, 5 mg bid, but has not taken x 30+ days UNTIL she took a dose 6 pm 3/14/ 18. Reviewing pharmacy recommendations for the length of time til spinal anesthesia can be considered, pt would need 48 hrs or more since last dose. She will therefore be evaluated overnight with plans for d/c after coordination of care, with plans to proceed with I & D of vulvar abscess next week, as she is afebrile and while in need of surgery , not deteriorating. Pertinent Gynecological History: Menses: post-menopausal Bleeding:  Contraception: tubal ligation DES exposure: unknown Blood transfusions: none Sexually transmitted diseases: no past history Previous GYN Procedures: tubal ligation  Last mammogram: normal Date: 10/17 Last pap:  Date:  OB History: G1, P1   Menstrual History: Menarche age: Patient's last menstrual period was 01/12/2011.    Past Medical History:  Diagnosis Date  . Anxiety   . 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 Coronary atherosclerosis of native coronary artery    DES to LAD September 2014  . Depression   . Facial cellulitis    12/2010  . Glomerulonephritis   . Headache(784.0)   . History of stroke   . Hyperlipidemia   . Insulin dependent diabetes mellitus ([place])    History of  diabetic ketoacidosis  . Ischemic cardiomyopathy    LVEF 40-45%  . Peripheral arterial disease ([place])   . Shingles    11/2010  . ST elevation myocardial infarction (STEMI) of anterior wall [facility])    Late presentation September 2014    Past Surgical History:  Procedure Laterality Date  . AMPUTATION  03/03/2011   Procedure: AMPUTATION DIGIT;  Surgeon: Newt Minion, MD;  Location: [place];  Service: Orthopedics;  Laterality: Left;  Left Foot Amputation 4th and 5th toes at MTP joint  . AMPUTATION  04/22/2011   Procedure: AMPUTATION BELOW KNEE;  Surgeon: Newt Minion, MD;  Location: [place];  Service: Orthopedics;  Laterality: Left;  Left Below Knee Amputation  . DILATION AND CURETTAGE OF UTERUS    . EMBOLECTOMY  01/29/2011   Procedure: EMBOLECTOMY;  Surgeon: Mal Misty, MD;  Location: [place] OR;  Service: Vascular;  Laterality: Left;  Left Popliteal and Tibial Embolectomy with patch angioplasty  . LEFT HEART CATHETERIZATION WITH CORONARY ANGIOGRAM N/A 10/01/2012   Procedure: LEFT HEART CATHETERIZATION WITH CORONARY ANGIOGRAM;  Surgeon: Peter M Martinique, MD;  Location: [facility] CATH LAB;  Service: Cardiovascular;  Laterality: N/A;  . LOWER EXTREMITY ANGIOGRAM N/A 01/27/2011   Procedure: LOWER EXTREMITY ANGIOGRAM;  Surgeon: Rosetta Posner, MD;  Location: [facility] CATH LAB;  Service: Cardiovascular;  Laterality: N/A;  . LOWER EXTREMITY ANGIOGRAM N/A 01/28/2011   Procedure: LOWER EXTREMITY ANGIOGRAM;  Surgeon: Conrad [place], MD;  Location: [facility]  CATH LAB;  Service: Cardiovascular;  Laterality: N/A;  . LOWER EXTREMITY ANGIOGRAM Left 01/29/2011   Procedure: LOWER EXTREMITY ANGIOGRAM;  Surgeon: Mal Misty, MD;  Location: [facility] CATH LAB;  Service: Cardiovascular;  Laterality: Left;  Marland Kitchen MULTIPLE TOOTH EXTRACTIONS    . TEE WITHOUT CARDIOVERSION  04/15/2011   Procedure: TRANSESOPHAGEAL ECHOCARDIOGRAM (TEE);  Surgeon: [name], MD;  [facility];  Service: Cardiovascular;  Laterality: N/A;  . WRIST SURGERY     Left     Family History  Problem Relation Age of Onset  . COPD Mother     alive - 8  . Hypertension Mother   . Diabetes Mother   . Stroke Mother   . Coronary artery disease Mother   . Diabetes Father     alive - 36  . Hypertension Father   . Coronary artery disease Father   . Anesthesia problems Neg Hx     Social History:  reports that she has been smoking Cigarettes.  She started smoking about 26 years ago. She has a 10.50 pack-year smoking history. She has never used smokeless tobacco. She reports that she uses drugs, including Marijuana, about 1 time per week. She reports that she does not drink alcohol.  Allergies:  Allergies  Allergen Reactions  . Ibuprofen Other (See Comments)    Kidney disease  . Losartan Swelling    Prescriptions Prior to Admission  Medication Sig Dispense Refill Last Dose  . ALPRAZolam (XANAX) 1 MG tablet Take 1 mg by mouth 3 (three) times daily as needed for anxiety.   04/01/2016 at Unknown time  . apixaban (ELIQUIS) 5 MG TABS tablet Take 5 mg by mouth 2 (two) times daily.   03/31/2016 at 1800  . doxycycline (VIBRAMYCIN) 100 MG capsule Take 100 mg by mouth 2 (two) times daily.   04/01/2016 at Unknown time  . insulin aspart (NOVOLOG FLEXPEN) 100 UNIT/ML injection Inject 2-10 Units into the skin 3 (three) times daily before meals. Sliding scale as follows 200-250=2 units 251-300=4 units 301-350=6 units 351-400=8 units >400-give 10 units and Notify MD   04/01/2016 at Unknown time  . Insulin Glargine (LANTUS SOLOSTAR) 100 UNIT/ML SOPN Inject 15 Units into the skin at bedtime. (Patient taking differently: Inject 20 Units into the skin at bedtime. )   03/31/2016 at Unknown time  . nitroGLYCERIN (NITROSTAT) 0.4 MG SL tablet Place 1 tablet (0.4 mg total) under the tongue every 5 (five) minutes as needed for chest pain. 25 tablet 5 Taking  . oxyCODONE-acetaminophen (PERCOCET) 10-325 MG per tablet Take 1 tablet by mouth 4 (four) times daily.   04/01/2016 at Unknown time   . metoprolol tartrate (LOPRESSOR) 25 MG tablet Take 1 tablet (25 mg total) by mouth 2 (two) times daily. (Patient not taking: Reported on 04/01/2016) 180 tablet 3 Not Taking at Unknown time    ROS  Blood pressure (!) 115/58, pulse 85, temperature 97.6 F (36.4 C), temperature source Oral, resp. rate 18, last menstrual period 01/12/2011, SpO2 97 %. Physical Exam  Constitutional: She is oriented to person, place, and time. She appears well-developed.  Appears older than stated age.  Eyes: Pupils are equal, round, and reactive to light.  Neck: Normal range of motion.  Cardiovascular: Normal rate and regular rhythm.   Respiratory: Effort normal and breath sounds normal. She has no wheezes. She has no rales. She exhibits no tenderness.  GI: Soft. Bowel sounds are normal. She exhibits no distension and no mass. There is no tenderness.  There is no rebound and no guarding.  Genitourinary: Vagina normal.  Genitourinary Comments: Vulva with firm induration and tenderness along a 5 cm wide x 12 cm long band of tissue on lateral aspect of left buttock/ labia majora.  No evidence of bartholins. Digital rectal: no abscess or interconnection to sore area  Musculoskeletal: Normal range of motion.  s/p left AKA.  Neurological: She is alert and oriented to person, place, and time.  Skin: Skin is warm and dry.    No results found for this or any previous visit (from the past 24 hour(s)).  No results found.  Assessment/Plan: Left vulvar abscess, chronic. DM type I Chronic periph vasc disease Recent use of eluquis on 03/31/16  Plan CBC, cmet , hgb AIC, ekg,        Discuss in am with anesthesia, with plans for I&D under Spinal next week after d/c to home  [name] V 04/01/2016, 8:47 PM

## 2016-04-01 NOTE — Progress Notes (Signed)
[facility] Visit  @DATE @            Patient name: [name] MRN [id number]  Date of birth: 06-24-1970  CC & HPI:  [name] is a 46 y.o. female presenting today for  an area of redness with associated swelling, pain, and drainage to the left vulva and left buttocks that has been ongoing for the past 1 month.   She has a history of type 1 IDDM; takes 25 units of Lantus BID, sliding scale of Novalog. She states her A1C is typically 13.   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[place])    Prior embolectomy, previously on Coumadin   Coronary atherosclerosis of native coronary artery    DES to LAD September 2014   Depression    Facial cellulitis    12/2010   Glomerulonephritis    Headache(784.0)    History of stroke    Hyperlipidemia    Insulin dependent diabetes mellitus ([place])    History of diabetic ketoacidosis   Ischemic cardiomyopathy    LVEF 40-45%   Peripheral arterial disease ([place])    Shingles    11/2010   ST elevation myocardial infarction (STEMI) of anterior wall [facility])    Late presentation September 2014                              Surgical Hx:    Past Surgical History:  Procedure Laterality Date   AMPUTATION  03/03/2011   Procedure: AMPUTATION DIGIT;  Surgeon: Newt Minion, MD;  Location: [place];  Service: Orthopedics;  Laterality: Left;  Left Foot Amputation 4th and 5th toes at MTP joint   AMPUTATION  04/22/2011   Procedure: AMPUTATION BELOW KNEE;  Surgeon: Newt Minion, MD;  Location: [place];  Service: Orthopedics;  Laterality: Left;  Left Below Knee Amputation   DILATION AND CURETTAGE OF UTERUS     EMBOLECTOMY  01/29/2011   Procedure: EMBOLECTOMY;  Surgeon: Mal Misty, MD;  Location: [facility] OR;  Service: Vascular;  Laterality: Left;  Left Popliteal and  Tibial Embolectomy with patch angioplasty   LEFT HEART CATHETERIZATION WITH CORONARY ANGIOGRAM N/A 10/01/2012   Procedure: LEFT HEART CATHETERIZATION WITH CORONARY ANGIOGRAM;  Surgeon: Peter M Martinique, MD;  Location: [facility] CATH LAB;  Service: Cardiovascular;  Laterality: N/A;   LOWER EXTREMITY ANGIOGRAM N/A 01/27/2011   Procedure: LOWER EXTREMITY ANGIOGRAM;  Surgeon: Rosetta Posner, MD;  Location: [facility] CATH LAB;  Service: Cardiovascular;  Laterality: N/A;   LOWER EXTREMITY ANGIOGRAM N/A 01/28/2011   Procedure: LOWER EXTREMITY ANGIOGRAM;  Surgeon: Conrad Nantucket, MD;  Location: [name] CATH LAB;  Service: Cardiovascular;  Laterality: N/A;   LOWER EXTREMITY ANGIOGRAM Left 01/29/2011   Procedure: LOWER EXTREMITY ANGIOGRAM;  Surgeon: Mal Misty, MD;  Location: [facility] CATH LAB;  Service: Cardiovascular;  Laterality: Left;   MULTIPLE TOOTH EXTRACTIONS     TEE WITHOUT CARDIOVERSION  04/15/2011   Procedure: TRANSESOPHAGEAL ECHOCARDIOGRAM (TEE);  Surgeon: [name], MD;  [facility];  Service: Cardiovascular;  Laterality: N/A;   WRIST SURGERY     Left   Medications: Reviewed & Updated - see associated section  Current Outpatient Prescriptions:    ALPRAZolam (XANAX) 1 MG tablet, Take 1 mg by mouth 3 (three) times daily as needed for anxiety., Disp: , Rfl:    apixaban (ELIQUIS) 5 MG TABS tablet, Take 5 mg by mouth 2 (two) times daily., Disp: , Rfl:    doxycycline (VIBRAMYCIN) 100 MG capsule, Take 100 mg by mouth 2 (two) times daily., Disp: , Rfl:    insulin aspart (NOVOLOG FLEXPEN) 100 UNIT/ML injection, Inject 2-10 Units into the skin 3 (three) times daily before meals. Sliding scale as follows 200-250=2 units 251-300=4 units 301-350=6 units 351-400=8 units >400-give 10 units and Notify MD, Disp: , Rfl:    Insulin Glargine (LANTUS SOLOSTAR) 100 UNIT/ML SOPN, Inject 15 Units into the skin at bedtime., Disp: , Rfl:    metoprolol tartrate (LOPRESSOR) 25 MG tablet, Take 1  tablet (25 mg total) by mouth 2 (two) times daily., Disp: 180 tablet, Rfl: 3   nitroGLYCERIN (NITROSTAT) 0.4 MG SL tablet, Place 1 tablet (0.4 mg total) under the tongue every 5 (five) minutes as needed for chest pain., Disp: 25 tablet, Rfl: 5   oxyCODONE-acetaminophen (PERCOCET) 10-325 MG per tablet, Take 1 tablet by mouth 4 (four) times daily., Disp: , Rfl:    Social History: Reviewed -  reports that she has been smoking Cigarettes.  She started smoking about 26 years ago. She has a 10.50 pack-year smoking history. She has never used smokeless tobacco.  Objective Findings:  Vitals: Blood pressure 128/70, pulse 92, weight 106 lb 12.8 oz (48.4 kg), last menstrual period 01/12/2011.  Physical Examination: General appearance - alert, well appearing, and in no distress Mental status - alert, oriented to person, place, and time Pelvic - VULVA: Small pin hole sized opening that is draining a deep gluteal abscess. Vaginal not affected    Assessment & Plan:   A:  1. Gluteal abscess, draining, multiple sites  P:  1. I&D of abscess will consider inpt care and do I&d in am. Note : pt is on eluquis, so will need to clear ths agent    By signing my name below, I, Sonum Patel, attest that this documentation has been prepared under the direction and in the presence of Jonnie Kind, MD. Electronically Signed: Sonum Patel, Scribe. 04/01/16. 4:01 PM.  I personally performed the services described in this documentation, which was SCRIBED in my presence. The recorded information has been reviewed and considered accurate. It has been edited as necessary during review. Jonnie Kind, MD

## 2016-04-02 ENCOUNTER — Encounter (HOSPITAL_COMMUNITY): Payer: Self-pay | Admitting: *Deleted

## 2016-04-02 DIAGNOSIS — N764 Abscess of vulva: Secondary | ICD-10-CM | POA: Diagnosis not present

## 2016-04-02 LAB — GLUCOSE, CAPILLARY
Glucose-Capillary: 55 mg/dL — ABNORMAL LOW (ref 65–99)
Glucose-Capillary: 56 mg/dL — ABNORMAL LOW (ref 65–99)

## 2016-04-02 MED ORDER — AMOXICILLIN-POT CLAVULANATE 875-125 MG PO TABS: 1.0000 | ORAL_TABLET | Freq: Two times a day (BID) | ORAL | 0 refills | Status: DC

## 2016-04-02 MED ORDER — DOXYCYCLINE HYCLATE 100 MG PO TABS: 100.0000 mg | ORAL_TABLET | Freq: Two times a day (BID) | ORAL | 0 refills | Status: DC

## 2016-04-02 MED ORDER — ENOXAPARIN SODIUM 40 MG/0.4ML ~~LOC~~ SOLN: 40.0000 mg | SUBCUTANEOUS | 0 refills | Status: DC

## 2016-04-02 MED ORDER — ENOXAPARIN SODIUM 30 MG/0.3ML ~~LOC~~ SOLN: 30.0000 mg | SUBCUTANEOUS | Status: DC

## 2016-04-02 NOTE — Consult Note (Signed)
Consult requested by: Dr. 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 She is medically noncompliant frequently misses appointments and tends to adjust her medications on her own. She has diabetes on insulin and has generally had poor control of her blood sugar. After she had had an arterial embolism she was placed on normal anticoagulant but she took herself off about a month ago but took a dose yesterday. She is in the hospital with abscess in her groin and buttocks area. She's going to be discharged today because she can't have an I&D until next week because of taking the dose of the anticoagulant. She says she feels okay. She has no new complaints. No nausea vomiting diarrhea. Surgical history wooded which is not documented in the medical record is that she's had multiple attempts at treatment of her peripheral arterial disease but she eventually culminated in having the below-the-knee amputation. She has had EGD in the past and she has had the catheterization and drug eluting stent.  Past Medical History:  Diagnosis Date  . Anxiety   . 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 Coronary atherosclerosis of native coronary artery    DES to LAD September 2014  . Depression   . Facial cellulitis    12/2010  . Glomerulonephritis   . Headache(784.0)   . History of stroke   . Hyperlipidemia   . Insulin dependent diabetes mellitus ([place])    History of diabetic ketoacidosis  . Ischemic cardiomyopathy    LVEF 40-45%  . Peripheral arterial disease ([place])   . Shingles    11/2010  . ST elevation  myocardial infarction (STEMI) of anterior wall [facility])    Late presentation September 2014     Family History  Problem Relation Age of Onset  . COPD Mother     alive - 61  . Hypertension Mother   . Diabetes Mother   . Stroke Mother   . Coronary artery disease Mother   . Diabetes Father     alive - 13  . Hypertension Father   . Coronary artery disease Father   . Anesthesia problems Neg Hx      Social History   Social History  . Marital status: Single    Spouse name: N/A  . Number of children: N/A  . Years of education: N/A   Social History Main Topics  . Smoking status: Current Every Day Smoker    Packs/day: 0.50    Years: 21.00    Types: Cigarettes    Start date: 07/06/1989    Last attempt to quit: 01/30/2011  . Smokeless tobacco: Never Used  . Alcohol use No     Comment: rarely  . Drug use: Yes    Frequency: 1.0 time per week    Types: Marijuana     Comment: Former  . Sexual activity: Yes    Partners: Male     Comment: pt stated that her tubal was only 50% effective- "not cut and burned"   Other Topics Concern  . Not on file   Social History Narrative   Unemployed.  Lives in [place] with [place], [place], and mother.  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     ROS: Except as mentioned 10 point review of systems is negative    Objective: Vital signs in last 24 hours: Temp:  [97.6 F (36.4 C)-98.5 F (36.9 C)] 98.2 F (36.8 C) (03/16 0556) Pulse Rate:  [75-92] 75 (03/16 0556) Resp:  [18] 18 (03/16 0556) BP: (99-128)/(42-70) 105/42 (03/16 0556) SpO2:  [97 %-100 %] 100 % (03/16 0556) Weight:  [48.4 kg (106 lb 12.8 oz)] 48.4 kg (106 lb 12.8 oz) (03/15 1500) Weight change:  Last BM Date: 04/01/16  Intake/Output from previous day: No intake/output data recorded.  PHYSICAL EXAM Constitutional: She is thin but well-developed in no acute distress. Eyes: Pupils react. EOMI. Ears nose mouth and throat: Her hearing is grossly normal. Mucous membranes  are moist. Throat is clear. Cardiovascular: Her heart is regular with normal heart sounds. She does not have any edema. Respiratory: Her respiratory effort is normal. She has bilateral rhonchi on lung exam. Gastrointestinal: She has normal bowel sounds. Her abdomen is soft without masses. Musculoskeletal: Normal strength. She has had the amputation. Neurological: No focal abnormalities psychiatric: She appears mildly anxious skin: Warm and dry. She has abscess in the area of the left groin that is draining some purulent material  Lab Results: Basic Metabolic Panel:  Recent Labs  04/01/16 2154  NA 128*  K 3.3*  CL 95*  CO2 26  GLUCOSE 142*  BUN 23*  CREATININE 1.37*  CALCIUM 8.5*   Liver Function Tests:  Recent Labs  04/01/16 2154  AST 18  ALT 11*  ALKPHOS 75  BILITOT 0.2*  PROT 6.1*  ALBUMIN 2.3*   No results for input(s): LIPASE, AMYLASE in the last 72 hours. No results for input(s): AMMONIA in the last 72 hours. CBC:  Recent Labs  04/01/16 2154  WBC 9.8  HGB 11.6*  HCT 33.7*  MCV 86.9  PLT 436*   Cardiac Enzymes: No results for input(s): CKTOTAL, CKMB, CKMBINDEX, TROPONINI in the last 72 hours. BNP: No results for input(s): PROBNP in the last 72 hours. D-Dimer: No results for input(s): DDIMER in the last 72 hours. CBG:  Recent Labs  04/01/16 2139 04/01/16 2329 04/02/16 0000 04/02/16 0736 04/02/16 0814  GLUCAP 249* 57* 108* 56* 55*   Hemoglobin A1C: No results for input(s): HGBA1C in the last 72 hours. Fasting Lipid Panel: No results for input(s): CHOL, HDL, LDLCALC, TRIG, CHOLHDL, LDLDIRECT in the last 72 hours. Thyroid Function Tests: No results for input(s): TSH, T4TOTAL, FREET4, T3FREE, THYROIDAB in the last 72 hours. Anemia Panel: No results for input(s): VITAMINB12, FOLATE, FERRITIN, TIBC, IRON, RETICCTPCT in the last 72 hours. Coagulation: No results for input(s): LABPROT, INR in the last 72 hours. Urine Drug Screen: Drugs of Abuse      Component Value Date/Time   LABOPIA NONE DETECTED 09/30/2012 1959   COCAINSCRNUR NONE DETECTED 09/30/2012 1959   LABBENZ POSITIVE (A) 09/30/2012 1959   AMPHETMU NONE DETECTED 09/30/2012 1959   THCU NONE DETECTED 09/30/2012 1959   LABBARB NONE DETECTED 09/30/2012 1959    Alcohol Level: No results for input(s): ETH in the last 72 hours. Urinalysis: No results for input(s): COLORURINE, LABSPEC, PHURINE, GLUCOSEU, HGBUR, BILIRUBINUR, KETONESUR, PROTEINUR, UROBILINOGEN, NITRITE, LEUKOCYTESUR in the last 72 hours.  Invalid input(s): APPERANCEUR Misc. Labs:   ABGS: No results for input(s): PHART, PO2ART, TCO2, HCO3 in the last 72 hours.  Invalid input(s): PCO2   MICROBIOLOGY: No results found for this or any previous visit (from the past  240 hour(s)).  Studies/Results: No results found.  Medications:  Prior to Admission:  Prescriptions Prior to Admission  Medication Sig Dispense Refill Last Dose  . ALPRAZolam (XANAX) 1 MG tablet Take 1 mg by mouth 3 (three) times daily as needed for anxiety.   04/01/2016 at Unknown time  . apixaban (ELIQUIS) 5 MG TABS tablet Take 5 mg by mouth 2 (two) times daily.   03/31/2016 at 1800  . doxycycline (VIBRAMYCIN) 100 MG capsule Take 100 mg by mouth 2 (two) times daily.   04/01/2016 at Unknown time  . insulin aspart (NOVOLOG FLEXPEN) 100 UNIT/ML injection Inject 2-10 Units into the skin 3 (three) times daily before meals. Sliding scale as follows 200-250=2 units 251-300=4 units 301-350=6 units 351-400=8 units >400-give 10 units and Notify MD   04/01/2016 at Unknown time  . Insulin Glargine (LANTUS SOLOSTAR) 100 UNIT/ML SOPN Inject 15 Units into the skin at bedtime. (Patient taking differently: Inject 20 Units into the skin at bedtime. )   03/31/2016 at Unknown time  . nitroGLYCERIN (NITROSTAT) 0.4 MG SL tablet Place 1 tablet (0.4 mg total) under the tongue every 5 (five) minutes as needed for chest pain. 25 tablet 5 Taking  . oxyCODONE-acetaminophen  (PERCOCET) 10-325 MG per tablet Take 1 tablet by mouth 4 (four) times daily.   04/01/2016 at Unknown time  . metoprolol tartrate (LOPRESSOR) 25 MG tablet Take 1 tablet (25 mg total) by mouth 2 (two) times daily. (Patient not taking: Reported on 04/01/2016) 180 tablet 3 Not Taking at Unknown time   Scheduled: . doxycycline  100 mg Oral BID  . enoxaparin (LOVENOX) injection  40 mg Subcutaneous Q24H  . insulin aspart  2-10 Units Subcutaneous TID AC  . insulin glargine  20 Units Subcutaneous BID  . oxyCODONE-acetaminophen  1 tablet Oral Q6H   And  . oxyCODONE  5 mg Oral Q6H   Continuous:  [id number], nitroGLYCERIN  Assesment: She has abscess of the vulva. She is not a candidate for surgery today which was the original plan because of taking the anticoagulant and the plan for spinal anesthesia.  At baseline she has diabetes which is generally not been well controlled. She has peripheral arterial disease and she's been chronically anticoagulated because of her history of arterial embolism resulting in amputation of her leg.  She has coronary artery occlusive disease but is not having any chest pain.  She has COPD at baseline and she is still smoking cigarettes. 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 No smoking over the weekend. No other anticoagulation over the weekend.    LOS: 1 day   Dennard Vezina L 04/02/2016, 8:16 AM

## 2016-04-02 NOTE — Progress Notes (Signed)
Blood sugar rechecked. It is 108 at this time. Patient is verbalizing of feeling much better.

## 2016-04-02 NOTE — Progress Notes (Signed)
Discharge instructions given to patient and family members. All verbalized understanding of instructions. Discharge to home with family.

## 2016-04-02 NOTE — Discharge Instructions (Signed)
Take your lovenox doses of 40 mg in the mornings, with your last dose Monday morning. DO NOT TAKE YOUR Tuesday MORNING LOVENOX DOSE BEFORE THE SURGERY.   Perianal Abscess An abscess is an infected area that is filled with pus. A perianal abscess occurs in the perineum, which is the area between the anus and the scrotum in males and between the anus and the vagina in females. Perianal abscesses can vary in size. Without treatment, a perianal abscess can become larger and cause other problems. What are the causes? This condition is caused by:  Waste from damaged or dead tissue (debris) that plugs up glands in the perineum. When this happens, an abscess may form.  Infections of the perineum. What are the signs or symptoms? Common symptoms of this condition include:  Swelling and redness in the area of the abscess. The redness may go beyond the abscess and appear as a red streak on the skin.  Pain in the area of the abscess, including pain when sitting, walking, or passing stool. Other possible symptoms include:  A visible, painful lump, or a lump that can be felt when touched.  Bleeding or pus-like discharge from the area.  Fever.  General weakness. How is this diagnosed? This condition is diagnosed based on your medical history and a physical exam of the affected area.  This may involve examining the rectal area with a gloved hand (digital rectal exam).  Sometimes, the health care provider needs to look into the rectum using a probe or a scope.  For women, it may require a careful vaginal exam. How is this treated? Treatment for this condition may include:  Making a cut (incision) in the abscess to drain the pus. This can sometimes be done in your health care provider's office or an emergency department after you are given medicine to numb the area (local anesthetic).  Surgery to drain the abscess. This is for larger or deeper abscesses.  Antibiotic medicines, if there is  infection in the surrounding tissue (cellulitis).  Having gauze packed into the abscess to continue draining the area.  Frequent baths in warm water that is deep enough to cover your hips and buttocks (sitz baths). These help the wound heal and they make the abscess less likely to come back. Follow these instructions at home: Medicines   Take over-the-counter and prescription medicines for pain, fever, or discomfort only as told by your health care provider.  If you were prescribed an antibiotic medicine, use it as told by your health care provider. Do not stop using the antibiotic even if you start to feel better.  Do not drive or use heavy machinery while taking prescription pain medicine. Wound care    Keep the skin around the wound clean and dry. Avoid cleaning the area too much.  Avoid scratching the wound.  Avoid using colored or perfumed toilet papers.  Take a sitz bath 3-4 times a day and after bowel movements. This will help reduce pain and swelling.  If directed, apply ice to the injured area:  Put ice in a plastic bag.  Place a towel between your skin and the bag.  Leave the ice on for 20 minutes, 2-3 times a day.  Check your incision area every day for signs of infection. Check for:  More redness, swelling, or pain.  More fluid or blood.  Warmth.  Pus or a bad smell. Gauze   If gauze was used in the abscess, follow instructions from your health care  provider about removing or changing the gauze. It can usually be removed in 2-3 days.  Wash your hands with soap and water before you remove or change your gauze. If soap and water are not available, use hand sanitizer.  If one or more drains were placed in the abscess cavity, be careful not to pull at them. Your health care provider will tell you how long they need to remain in place. General instructions   Keep all follow-up visits as told by your health care provider. This is important. Contact a health  care provider if:  You have trouble passing stool or passing urine.  Your pain or swelling in the affected area does not seem to be getting better.  The gauze packing or the drains come out before the planned time. Get help right away if:  You have problems moving or using your legs.  You have severe or increasing pain.  Your swelling in the affected area suddenly gets worse.  You have a large increase in bleeding or passing of pus.  You have chills or a fever. This information is not intended to replace advice given to you by your health care provider. Make sure you discuss any questions you have with your health care provider. Document Released: 02/10/2006 Document Revised: 07/25/2015 Document Reviewed: 06/16/2015 Elsevier Interactive Patient Education  2017 [employer].

## 2016-04-02 NOTE — Discharge Summary (Signed)
Physician Discharge Summary  Patient ID: [name] MRN: [id number] DOB/AGE: 1970/11/18 46 y.o.  Admit date: 04/01/2016 Discharge date: 04/02/2016  Admission Diagnoses:VULVAR ABSCESS                                        TYPE I DIABETES, Poorly controlled                                           Discharge Diagnoses:  Principal Problem:   Abscess of vulva   Discharged Condition: stable  Hospital Course:  CC & HPI:  [name] is a 46 y.o. female presenting today for  an area of redness with associated swelling, pain, and drainage to the left vulva and left buttocks that has been ongoing for the past 1 month.   She has a history of type 1 IDDM; takes 25 units of Lantus BID, sliding scale of Novalog. She states her A1C is typically 13.  When seen in the office on 04/01/2016 she had multiple draining sites along a sinus tract from the buttock to the left inguinal area there were exquisitely tender and draining a tissue fluid. The patient was allowed to go home and get her staffing come back in the hospital. Upon arrival further history questioning reveals patient had been on Eliquis that had only taken 1 dose in the past 30 days. Unfortunately she took that does the day before being admitted, on the 14th.. For that reason she is placed on oral antibiotics rescheduled for early next week for incision and drainage She was readmitted to the time this issue was found outside the patient was kept overnight and baseline laboratory work preoperatively arranged   Consults: DR Luan Pulling, DR Patsey Berthold  Significant Diagnostic Studies: labs:  Hemoglobin & Hematocrit     Component Value Date/Time   HGB 11.6 (L) 04/06/2016 0827   HCT 34.8 (L) 04/06/2016 0827    Treatments: antibiotics: metronidazole and doxycycline  Discharge Exam: Blood pressure (!) 105/42, pulse 75, temperature 98.2 F (36.8 C), temperature source Oral, resp. rate 18, last menstrual period 01/12/2011, SpO2 100  %. General appearance: alert, cooperative, appears older than stated age and mild distress Head: Normocephalic, without obvious abnormality, atraumatic Chest wall: no tenderness Pelvic: no adnexal masses or tenderness, rectovaginal septum normal, uterus normal size, shape, and consistency, vagina normal without discharge and Vulva shows a ridge of indurated weepy inflamed tissue from the inguinal area downward into the left buttocks Extremities: extremities normal, atraumatic, no cyanosis or edema and Patient is status post left leg BKA Skin: Tender inflamed sinus tract with granulation tissue at the exit in the left inguinal area and tender and questionably fluctuated down to the left buttock Lymph nodes: Cervical, supraclavicular, and axillary nodes normal. Incision/Wound:  Disposition: 01-Home or Self Care the patient was given oral antibiotics doxycycline and Flagyl, and will continue these until scheduled for surgery on March 20  Discharge Instructions    Call MD for:  temperature >100.4    Complete by:  As directed    Diet - low sodium heart healthy    Complete by:  As directed    Increase activity slowly    Complete by:  As directed      Allergies as of 04/02/2016  Reactions   Ibuprofen Other (See Comments)   Kidney disease   Losartan Swelling      Medication List    STOP taking these medications   ELIQUIS 5 MG Tabs tablet Generic drug:  apixaban   metoprolol tartrate 25 MG tablet Commonly known as:  LOPRESSOR     TAKE these medications   ALPRAZolam 1 MG tablet Commonly known as:  XANAX Take 1 mg by mouth 3 (three) times daily as needed for anxiety.   amoxicillin-clavulanate 875-125 MG tablet Commonly known as:  AUGMENTIN Take 1 tablet by mouth 2 (two) times daily.   doxycycline 100 MG capsule Commonly known as:  VIBRAMYCIN Take 100 mg by mouth 2 (two) times daily. What changed:  Another medication with the same name was added. Make sure you understand how  and when to take each.   doxycycline 100 MG tablet Commonly known as:  VIBRA-TABS Take 1 tablet (100 mg total) by mouth 2 (two) times daily. What changed:  You were already taking a medication with the same name, and this prescription was added. Make sure you understand how and when to take each.   enoxaparin 40 MG/0.4ML injection Commonly known as:  LOVENOX Inject 0.4 mLs (40 mg total) into the skin daily.   Insulin Glargine 100 UNIT/ML Solostar Pen Commonly known as:  LANTUS SOLOSTAR Inject 15 Units into the skin at bedtime. What changed:  how much to take   nitroGLYCERIN 0.4 MG SL tablet Commonly known as:  NITROSTAT Place 1 tablet (0.4 mg total) under the tongue every 5 (five) minutes as needed for chest pain.   NOVOLOG FLEXPEN 100 UNIT/ML injection Generic drug:  insulin aspart Inject 2-10 Units into the skin 3 (three) times daily before meals. Sliding scale as follows 200-250=2 units 251-300=4 units 301-350=6 units 351-400=8 units >400-give 10 units and Notify MD   oxyCODONE-acetaminophen 10-325 MG tablet Commonly known as:  PERCOCET Take 1 tablet by mouth 4 (four) times daily.      Follow-up Information    Jonnie Kind, MD Follow up.   Specialties:  Obstetrics and Gynecology, Radiology Why:  postop visit in 2 weeks, you will have surgery next tuesday, and will be contacted by phone today and again monday by day surgery, [phone] Contact information: [address] [place] [address] [phone]           Signed: Jonnie Kind 04/02/2016, 8:49 AM

## 2016-04-02 NOTE — Progress Notes (Signed)
Patient's blood sugar is 57. OJ and food given. She is alert and oriented.

## 2016-04-03 LAB — HEMOGLOBIN A1C
Hgb A1c MFr Bld: 13.9 % — ABNORMAL HIGH (ref 4.8–5.6)
Mean Plasma Glucose: 352 mg/dL

## 2016-04-05 ENCOUNTER — Encounter (HOSPITAL_COMMUNITY)
Admission: RE | Admit: 2016-04-05 | Discharge: 2016-04-05 | Disposition: A | Discharge status: Home / Self Care | Payer: Medicaid Other | Source: Ambulatory Visit | Attending: Obstetrics and Gynecology | Admitting: Obstetrics and Gynecology

## 2016-04-05 ENCOUNTER — Other Ambulatory Visit: Payer: Self-pay | Admitting: Obstetrics and Gynecology

## 2016-04-05 NOTE — Progress Notes (Unsigned)
[facility] Visit  @DATE @            Patient name: [name]   MRN [id number]  Date of birth: February 11, 1970  CC & HPI:  Lisa SITZMANN is a 46 y.o. female presenting today for  an area of redness with associated swelling, pain, and drainage to the left vulva and left buttocks that has been ongoing for the past 1 month.   She has a history of type 1 IDDM; takes 25 units of Lantus BID, sliding scale of Novalog. She states her A1C is typically 13.   ROS:  ROS +vulvar pain, discharge, swelling, redness -fever  Pertinent History Reviewed:   Reviewed: Significant for DM Medical             Past Medical History:  Diagnosis Date  . Anxiety   . 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 Coronary atherosclerosis of native coronary artery    DES to LAD September 2014  . Depression   . Facial cellulitis    12/2010  . Glomerulonephritis   . Headache(784.0)   . History of stroke   . Hyperlipidemia   . Insulin dependent diabetes mellitus ([name])    History of diabetic ketoacidosis  . Ischemic cardiomyopathy    LVEF 40-45%  . Peripheral arterial disease ([name])   . Shingles    11/2010  . ST elevation myocardial infarction (STEMI) of anterior wall [name])    Late presentation September 2014                              Surgical Hx:         Past Surgical History:  Procedure Laterality Date  . AMPUTATION  03/03/2011   Procedure: AMPUTATION DIGIT;  Surgeon: Newt Minion, MD;  Location: [place];  Service: Orthopedics;  Laterality: Left;  Left Foot Amputation 4th and 5th toes at MTP joint  . AMPUTATION  04/22/2011   Procedure: AMPUTATION BELOW KNEE;  Surgeon: Newt Minion, MD;  Location: [place];  Service: Orthopedics;  Laterality: Left;  Left Below Knee Amputation  . DILATION AND CURETTAGE OF UTERUS    . EMBOLECTOMY  01/29/2011   Procedure: EMBOLECTOMY;  Surgeon: Mal Misty, MD;  Location: [name];  Service:  Vascular;  Laterality: Left;  Left Popliteal and Tibial Embolectomy with patch angioplasty  . LEFT HEART CATHETERIZATION WITH CORONARY ANGIOGRAM N/A 10/01/2012   Procedure: LEFT HEART CATHETERIZATION WITH CORONARY ANGIOGRAM;  Surgeon: Peter M Martinique, MD;  Location: [name];  Service: Cardiovascular;  Laterality: N/A;  . LOWER EXTREMITY ANGIOGRAM N/A 01/27/2011   Procedure: LOWER EXTREMITY ANGIOGRAM;  Surgeon: Rosetta Posner, MD;  Location: [name];  Service: Cardiovascular;  Laterality: N/A;  . LOWER EXTREMITY ANGIOGRAM N/A 01/28/2011   Procedure: LOWER EXTREMITY ANGIOGRAM;  Surgeon: Conrad Bellevue, MD;  Location: [name];  Service: Cardiovascular;  Laterality: N/A;  . LOWER EXTREMITY ANGIOGRAM Left 01/29/2011   Procedure: LOWER EXTREMITY ANGIOGRAM;  Surgeon: Mal Misty, MD;  Location: [name];  Service: Cardiovascular;  Laterality: Left;  Marland Kitchen MULTIPLE TOOTH EXTRACTIONS    . TEE WITHOUT CARDIOVERSION  04/15/2011   Procedure: TRANSESOPHAGEAL ECHOCARDIOGRAM (TEE);  Surgeon: [name], MD;  Location: [name];  Service: Cardiovascular;  Laterality: N/A;  . WRIST SURGERY     Left   Medications: Reviewed & Updated - see  associated section                       Current Outpatient Prescriptions:  .  ALPRAZolam (XANAX) 1 MG tablet, Take 1 mg by mouth 3 (three) times daily as needed for anxiety., Disp: , Rfl:  .  apixaban (ELIQUIS) 5 MG TABS tablet, Take 5 mg by mouth 2 (two) times daily., Disp: , Rfl:  .  doxycycline (VIBRAMYCIN) 100 MG capsule, Take 100 mg by mouth 2 (two) times daily., Disp: , Rfl:  .  insulin aspart (NOVOLOG FLEXPEN) 100 UNIT/ML injection, Inject 2-10 Units into the skin 3 (three) times daily before meals. Sliding scale as follows 200-250=2 units 251-300=4 units 301-350=6 units 351-400=8 units >400-give 10 units and Notify MD, Disp: , Rfl:  .  Insulin Glargine (LANTUS SOLOSTAR) 100 UNIT/ML SOPN, Inject 15 Units into the skin at bedtime., Disp: , Rfl:   .  metoprolol tartrate (LOPRESSOR) 25 MG tablet, Take 1 tablet (25 mg total) by mouth 2 (two) times daily., Disp: 180 tablet, Rfl: 3 .  nitroGLYCERIN (NITROSTAT) 0.4 MG SL tablet, Place 1 tablet (0.4 mg total) under the tongue every 5 (five) minutes as needed for chest pain., Disp: 25 tablet, Rfl: 5 .  oxyCODONE-acetaminophen (PERCOCET) 10-325 MG per tablet, Take 1 tablet by mouth 4 (four) times daily., Disp: , Rfl:    Social History: Reviewed -  reports that she has been smoking Cigarettes.  She started smoking about 26 years ago. She has a 10.50 pack-year smoking history. She has never used smokeless tobacco.  Objective Findings:  Vitals: Blood pressure 128/70, pulse 92, weight 106 lb 12.8 oz (48.4 kg), last menstrual period 01/12/2011.  Physical Examination: General appearance - alert, well appearing, and in no distress Physical Examination: Chest - clear to auscultation, no wheezes, rales [name] rhonchi, symmetric air entry Heart - normal rate and regular rhythm Abdomen - soft, nontender, nondistended, no masses [name] organomegaly Mental status - alert, oriented to person, place, and time Pelvic - VULVA: Small pin hole sized openings in the skin medial to left inguinal crease that is draining a deep gluteal abscess pocket. There is minimal fluctuance. Vaginal not affected and perianal tissues are intact, so not felt to be a perirectal abscess with extension.   Assessment & Plan:   A:  1. Gluteal abscess, draining, multiple sites  P:  1. I&D of abscess will consider inpt care and do I&d in am. Note : pt was on eluquis, so was placed on lovenox over the weekend and took last dose yesterday

## 2016-04-06 ENCOUNTER — Ambulatory Visit (HOSPITAL_COMMUNITY): Payer: Medicaid Other | Admitting: Anesthesiology

## 2016-04-06 ENCOUNTER — Encounter (HOSPITAL_COMMUNITY): Payer: Self-pay | Admitting: Anesthesiology

## 2016-04-06 ENCOUNTER — Observation Stay (HOSPITAL_COMMUNITY)
Admission: RE | Admit: 2016-04-06 | Discharge: 2016-04-06 | Disposition: A | Discharge status: Home / Self Care | Payer: Medicaid Other | Source: Ambulatory Visit | Attending: Obstetrics and Gynecology | Admitting: Obstetrics and Gynecology

## 2016-04-06 ENCOUNTER — Encounter (HOSPITAL_COMMUNITY)
Admission: RE | Disposition: A | Discharge status: Home / Self Care | Payer: Self-pay | Source: Ambulatory Visit | Attending: Obstetrics and Gynecology

## 2016-04-06 DIAGNOSIS — Z79899 Other long term (current) drug therapy: Secondary | ICD-10-CM | POA: Insufficient documentation

## 2016-04-06 DIAGNOSIS — N764 Abscess of vulva: Principal | ICD-10-CM | POA: Insufficient documentation

## 2016-04-06 DIAGNOSIS — Z8673 Personal history of transient ischemic attack (TIA), and cerebral infarction without residual deficits: Secondary | ICD-10-CM | POA: Diagnosis not present

## 2016-04-06 DIAGNOSIS — Z836 Family history of other diseases of the respiratory system: Secondary | ICD-10-CM | POA: Diagnosis not present

## 2016-04-06 DIAGNOSIS — F17210 Nicotine dependence, cigarettes, uncomplicated: Secondary | ICD-10-CM | POA: Insufficient documentation

## 2016-04-06 DIAGNOSIS — Z794 Long term (current) use of insulin: Secondary | ICD-10-CM | POA: Insufficient documentation

## 2016-04-06 DIAGNOSIS — E785 Hyperlipidemia, unspecified: Secondary | ICD-10-CM | POA: Insufficient documentation

## 2016-04-06 DIAGNOSIS — I252 Old myocardial infarction: Secondary | ICD-10-CM | POA: Diagnosis not present

## 2016-04-06 DIAGNOSIS — E1051 Type 1 diabetes mellitus with diabetic peripheral angiopathy without gangrene: Secondary | ICD-10-CM | POA: Diagnosis not present

## 2016-04-06 DIAGNOSIS — I2510 Atherosclerotic heart disease of native coronary artery without angina pectoris: Secondary | ICD-10-CM | POA: Diagnosis not present

## 2016-04-06 DIAGNOSIS — Z833 Family history of diabetes mellitus: Secondary | ICD-10-CM | POA: Diagnosis not present

## 2016-04-06 DIAGNOSIS — F419 Anxiety disorder, unspecified: Secondary | ICD-10-CM | POA: Diagnosis not present

## 2016-04-06 DIAGNOSIS — F329 Major depressive disorder, single episode, unspecified: Secondary | ICD-10-CM | POA: Insufficient documentation

## 2016-04-06 DIAGNOSIS — Z792 Long term (current) use of antibiotics: Secondary | ICD-10-CM | POA: Insufficient documentation

## 2016-04-06 DIAGNOSIS — Z89512 Acquired absence of left leg below knee: Secondary | ICD-10-CM | POA: Diagnosis not present

## 2016-04-06 HISTORY — PX: INCISION AND DRAINAGE ABSCESS: SHX5864

## 2016-04-06 LAB — GLUCOSE, CAPILLARY
Glucose-Capillary: 533 mg/dL (ref 65–99)
Glucose-Capillary: 364 mg/dL — ABNORMAL HIGH (ref 65–99)
Glucose-Capillary: 161 mg/dL — ABNORMAL HIGH (ref 65–99)
Glucose-Capillary: 105 mg/dL — ABNORMAL HIGH (ref 65–99)
Glucose-Capillary: 66 mg/dL (ref 65–99)
Glucose-Capillary: 125 mg/dL — ABNORMAL HIGH (ref 65–99)
Glucose-Capillary: 190 mg/dL — ABNORMAL HIGH (ref 65–99)

## 2016-04-06 LAB — BASIC METABOLIC PANEL
Anion gap: 8 (ref 5–15)
BUN: 28 mg/dL — ABNORMAL HIGH (ref 6–20)
CO2: 25 mmol/L (ref 22–32)
Calcium: 8.2 mg/dL — ABNORMAL LOW (ref 8.9–10.3)
Chloride: 96 mmol/L — ABNORMAL LOW (ref 101–111)
Creatinine, Ser: 1.08 mg/dL — ABNORMAL HIGH (ref 0.44–1.00)
GFR calc Af Amer: >60 mL/min (ref >60)
GFR calc non Af Amer: >60 mL/min (ref >60)
Glucose, Bld: 511 mg/dL (ref 65–99)
Potassium: 4.5 mmol/L (ref 3.5–5.1)
Sodium: 129 mmol/L — ABNORMAL LOW (ref 135–145)

## 2016-04-06 LAB — CBC
HCT: 34.8 % — ABNORMAL LOW (ref 36.0–46.0)
Hemoglobin: 11.6 g/dL — ABNORMAL LOW (ref 12.0–15.0)
MCH: 29.7 pg (ref 26.0–34.0)
MCHC: 33.3 g/dL (ref 30.0–36.0)
MCV: 89.2 fL (ref 78.0–100.0)
Platelets: 452 10*3/uL — ABNORMAL HIGH (ref 150–400)
RBC: 3.9 MIL/uL (ref 3.87–5.11)
RDW: 15 % (ref 11.5–15.5)
WBC: 10 10*3/uL (ref 4.0–10.5)

## 2016-04-06 LAB — GRAM STAIN: Gram Stain: NONE SEEN

## 2016-04-06 LAB — TYPE AND SCREEN
ABO/RH(D): B POS
Antibody Screen: NEGATIVE

## 2016-04-06 LAB — HCG, SERUM, QUALITATIVE: Preg, Serum: NEGATIVE

## 2016-04-06 SURGERY — INCISION AND DRAINAGE, ABSCESS
Anesthesia: General

## 2016-04-06 MED ORDER — ONDANSETRON HCL 4 MG/2ML IJ SOLN
INTRAMUSCULAR | Status: AC
  Filled 2016-04-06: qty 2

## 2016-04-06 MED ORDER — PROPOFOL 10 MG/ML IV BOLUS
INTRAVENOUS | Status: AC
  Filled 2016-04-06: qty 20

## 2016-04-06 MED ORDER — INSULIN ASPART 100 UNIT/ML ~~LOC~~ SOLN
6.0000 [IU] | Freq: Once | SUBCUTANEOUS | Status: AC
  Administered 2016-04-06: 6 [IU] via SUBCUTANEOUS
  Filled 2016-04-06: qty 0.06

## 2016-04-06 MED ORDER — INSULIN ASPART 100 UNIT/ML ~~LOC~~ SOLN
0.0000 [IU] | Freq: Three times a day (TID) | SUBCUTANEOUS | Status: DC
  Administered 2016-04-06: 2 [IU] via SUBCUTANEOUS

## 2016-04-06 MED ORDER — OXYCODONE-ACETAMINOPHEN 5-325 MG PO TABS
1.0000 | ORAL_TABLET | Freq: Four times a day (QID) | ORAL | Status: DC
  Administered 2016-04-06: 1 via ORAL
  Filled 2016-04-06: qty 1

## 2016-04-06 MED ORDER — ALPRAZOLAM 1 MG PO TABS
1.0000 mg | ORAL_TABLET | Freq: Three times a day (TID) | ORAL | Status: DC | PRN
  Administered 2016-04-06: 1 mg via ORAL
  Filled 2016-04-06: qty 1

## 2016-04-06 MED ORDER — DEXTROSE 50 % IV SOLN
INTRAVENOUS | Status: AC
  Filled 2016-04-06: qty 50

## 2016-04-06 MED ORDER — OXYCODONE HCL 5 MG PO TABS
5.0000 mg | ORAL_TABLET | Freq: Four times a day (QID) | ORAL | Status: DC
  Administered 2016-04-06: 5 mg via ORAL
  Filled 2016-04-06: qty 1

## 2016-04-06 MED ORDER — OXYCODONE-ACETAMINOPHEN 10-325 MG PO TABS: 1.0000 | ORAL_TABLET | Freq: Four times a day (QID) | ORAL | Status: DC

## 2016-04-06 MED ORDER — BACITRACIN-NEOMYCIN-POLYMYXIN 400-5-5000 EX OINT
TOPICAL_OINTMENT | CUTANEOUS | Status: AC
  Filled 2016-04-06: qty 1

## 2016-04-06 MED ORDER — OXYCODONE-ACETAMINOPHEN 5-325 MG PO TABS
1.0000 | ORAL_TABLET | ORAL | Status: DC | PRN
  Administered 2016-04-06: 2 via ORAL
  Filled 2016-04-06: qty 2

## 2016-04-06 MED ORDER — BUPIVACAINE-EPINEPHRINE (PF) 0.5% -1:200000 IJ SOLN
INTRAMUSCULAR | Status: DC | PRN
  Administered 2016-04-06: 8 mL via PERINEURAL

## 2016-04-06 MED ORDER — SODIUM CHLORIDE 0.9 % IV SOLN
INTRAVENOUS | Status: DC
  Administered 2016-04-06: 15:00:00 via INTRAVENOUS

## 2016-04-06 MED ORDER — HYDROMORPHONE HCL 1 MG/ML IJ SOLN
0.2500 mg | INTRAMUSCULAR | Status: DC | PRN
  Administered 2016-04-06 (×3): 0.5 mg via INTRAVENOUS
  Filled 2016-04-06 (×3): qty 0.5

## 2016-04-06 MED ORDER — FENTANYL CITRATE (PF) 100 MCG/2ML IJ SOLN
INTRAMUSCULAR | Status: DC | PRN
  Administered 2016-04-06: 25 ug via INTRAVENOUS
  Administered 2016-04-06: 50 ug via INTRAVENOUS
  Administered 2016-04-06: 25 ug via INTRAVENOUS

## 2016-04-06 MED ORDER — DEXTROSE 5 % IV SOLN
2.0000 g | INTRAVENOUS | Status: AC
  Administered 2016-04-06: 2 g via INTRAVENOUS
  Filled 2016-04-06: qty 2

## 2016-04-06 MED ORDER — ONDANSETRON HCL 4 MG/2ML IJ SOLN
INTRAMUSCULAR | Status: DC | PRN
  Administered 2016-04-06: 4 mg via INTRAVENOUS

## 2016-04-06 MED ORDER — INSULIN ASPART 100 UNIT/ML ~~LOC~~ SOLN: 2.0000 [IU] | Freq: Three times a day (TID) | SUBCUTANEOUS | Status: DC

## 2016-04-06 MED ORDER — DEXTROSE 50 % IV SOLN
25.0000 mL | Freq: Once | INTRAVENOUS | Status: AC
  Administered 2016-04-06: 25 mL via INTRAVENOUS

## 2016-04-06 MED ORDER — LIDOCAINE HCL 1 % IJ SOLN
INTRAMUSCULAR | Status: DC | PRN
  Administered 2016-04-06: 50 mg via INTRADERMAL

## 2016-04-06 MED ORDER — DOXYCYCLINE HYCLATE 100 MG PO TABS: 100.0000 mg | ORAL_TABLET | Freq: Two times a day (BID) | ORAL | Status: DC

## 2016-04-06 MED ORDER — BUPIVACAINE-EPINEPHRINE (PF) 0.5% -1:200000 IJ SOLN
INTRAMUSCULAR | Status: AC
  Filled 2016-04-06: qty 30

## 2016-04-06 MED ORDER — PROPOFOL 10 MG/ML IV BOLUS
INTRAVENOUS | Status: DC | PRN
  Administered 2016-04-06: 150 mg via INTRAVENOUS

## 2016-04-06 MED ORDER — ONDANSETRON HCL 4 MG PO TABS: 4.0000 mg | ORAL_TABLET | Freq: Four times a day (QID) | ORAL | Status: DC | PRN

## 2016-04-06 MED ORDER — FENTANYL CITRATE (PF) 100 MCG/2ML IJ SOLN
INTRAMUSCULAR | Status: AC
  Filled 2016-04-06: qty 2

## 2016-04-06 MED ORDER — ONDANSETRON HCL 4 MG/2ML IJ SOLN: 4.0000 mg | Freq: Four times a day (QID) | INTRAMUSCULAR | Status: DC | PRN

## 2016-04-06 MED ORDER — MIDAZOLAM HCL 2 MG/2ML IJ SOLN
1.0000 mg | INTRAMUSCULAR | Status: DC
  Administered 2016-04-06: 2 mg via INTRAVENOUS
  Filled 2016-04-06: qty 2

## 2016-04-06 MED ORDER — AMOXICILLIN-POT CLAVULANATE 875-125 MG PO TABS
1.0000 | ORAL_TABLET | Freq: Two times a day (BID) | ORAL | Status: DC
  Administered 2016-04-06: 1 via ORAL
  Filled 2016-04-06: qty 1

## 2016-04-06 MED ORDER — ENOXAPARIN SODIUM 40 MG/0.4ML ~~LOC~~ SOLN: 40.0000 mg | SUBCUTANEOUS | Status: DC

## 2016-04-06 MED ORDER — NITROGLYCERIN 0.4 MG SL SUBL: 0.4000 mg | SUBLINGUAL_TABLET | SUBLINGUAL | Status: DC | PRN

## 2016-04-06 MED ORDER — SODIUM CHLORIDE 0.9 % IV SOLN
INTRAVENOUS | Status: DC
  Administered 2016-04-06 (×2): via INTRAVENOUS

## 2016-04-06 MED ORDER — MIDAZOLAM HCL 2 MG/2ML IJ SOLN
INTRAMUSCULAR | Status: AC
  Filled 2016-04-06: qty 2

## 2016-04-06 MED ORDER — KETOROLAC TROMETHAMINE 30 MG/ML IJ SOLN: 30.0000 mg | Freq: Once | INTRAMUSCULAR | Status: DC

## 2016-04-06 MED ORDER — PANTOPRAZOLE SODIUM 40 MG PO TBEC: 40.0000 mg | DELAYED_RELEASE_TABLET | Freq: Every day | ORAL | Status: DC

## 2016-04-06 MED ORDER — DEXTROSE IN LACTATED RINGERS 5 % IV SOLN
INTRAVENOUS | Status: DC | PRN
  Administered 2016-04-06: 11:00:00 via INTRAVENOUS

## 2016-04-06 MED ORDER — PROMETHAZINE HCL 12.5 MG PO TABS
25.0000 mg | ORAL_TABLET | Freq: Four times a day (QID) | ORAL | Status: DC | PRN
  Administered 2016-04-06: 25 mg via ORAL
  Filled 2016-04-06: qty 2

## 2016-04-06 MED ORDER — INSULIN GLARGINE 100 UNIT/ML ~~LOC~~ SOLN
15.0000 [IU] | Freq: Every day | SUBCUTANEOUS | Status: DC
  Filled 2016-04-06: qty 0.15

## 2016-04-06 SURGICAL SUPPLY — 29 items
BAG HAMPER (MISCELLANEOUS) ×3 IMPLANT
BLADE 15 SAFETY STRL DISP (BLADE) ×3 IMPLANT
BNDG GAUZE ROLL STR 2.25X3YD (GAUZE/BANDAGES/DRESSINGS) ×2 IMPLANT
BNDG GZE SM 3X2.25 6 PLY (GAUZE/BANDAGES/DRESSINGS) ×1
CLOTH BEACON ORANGE TIMEOUT ST (SAFETY) ×3 IMPLANT
COVER LIGHT HANDLE STERIS (MISCELLANEOUS) ×6 IMPLANT
DECANTER SPIKE VIAL GLASS SM (MISCELLANEOUS) ×3 IMPLANT
ELECT REM PT RETURN 9FT ADLT (ELECTROSURGICAL) ×3
ELECTRODE REM PT RTRN 9FT ADLT (ELECTROSURGICAL) ×1 IMPLANT
GAUZE IODOFORM PACK 1/2 7832 (GAUZE/BANDAGES/DRESSINGS) ×2 IMPLANT
GAUZE PACKING FOLDED 1/2 STR (GAUZE/BANDAGES/DRESSINGS) ×2 IMPLANT
GLOVE BIOGEL PI IND STRL 7.0 (GLOVE) ×1 IMPLANT
GLOVE BIOGEL PI IND STRL 9 (GLOVE) ×1 IMPLANT
GLOVE BIOGEL PI INDICATOR 7.0 (GLOVE) ×2
GLOVE BIOGEL PI INDICATOR 9 (GLOVE) ×2
GLOVE ECLIPSE 9.0 STRL (GLOVE) ×3 IMPLANT
GOWN SPEC L3 XXLG W/TWL (GOWN DISPOSABLE) ×6 IMPLANT
GOWN STRL REUS W/TWL LRG LVL3 (GOWN DISPOSABLE) ×3 IMPLANT
KIT ROOM TURNOVER AP CYSTO (KITS) ×3 IMPLANT
MANIFOLD NEPTUNE II (INSTRUMENTS) ×3 IMPLANT
NDL HYPO 25X1 1.5 SAFETY (NEEDLE) IMPLANT
NEEDLE HYPO 25X1 1.5 SAFETY (NEEDLE) IMPLANT
NS IRRIG 1000ML POUR BTL (IV SOLUTION) ×3 IMPLANT
PACK PERI GYN (CUSTOM PROCEDURE TRAY) ×3 IMPLANT
PAD ARMBOARD 7.5X6 YLW CONV (MISCELLANEOUS) ×3 IMPLANT
SET BASIN LINEN APH (SET/KITS/TRAYS/PACK) ×3 IMPLANT
SUT CHROMIC 3 0 SH 27 (SUTURE) IMPLANT
SYRINGE 10CC LL (SYRINGE) IMPLANT
TUBE ANAEROBIC PORT A CUL  W/M (MISCELLANEOUS) ×3 IMPLANT

## 2016-04-06 NOTE — Progress Notes (Signed)
Discharge instructions gone over with patient. Verbalized understanding. Told to await lab to come for bloodwork and then she could go home. Verbalized understanding.

## 2016-04-06 NOTE — Discharge Instructions (Signed)
Topical neosporin daily Resume eliquis Have the drains removed Thursday

## 2016-04-06 NOTE — Progress Notes (Signed)
Day of Surgery Procedure(s) (LRB): INCISION AND DRAINAGE ABSCESS (N/A)  Subjective: Patient reports incisional pain, tolerating PO and no problems voiding.  IV has infiltrated, and pt is eating CHO modified diet w/o problems.  Objective: I have reviewed patient's vital signs, intake and output, medications and labs. CBG (last 3)   Recent Labs  04/06/16 1211 04/06/16 1405 04/06/16 1625  GLUCAP 105* 125* 190*     General: alert, cooperative and no distress Resp: clear to auscultation bilaterally GI: soft, non-tender; bowel sounds normal; no masses,  no organomegaly  Assessment: s/p Procedure(s): INCISION AND DRAINAGE ABSCESS (N/A): stable  Plan: Discharge home  LOS: 0 days    Gearldene Fiorenza V 04/06/2016, 5:37 PM

## 2016-04-06 NOTE — Anesthesia Postprocedure Evaluation (Signed)
Anesthesia Post Note  Patient: [name]  Procedure(s) Performed: Procedure(s) (LRB): INCISION AND DRAINAGE ABSCESS (N/A)  Anesthesia Type: General Level of consciousness: awake and alert, oriented and patient cooperative Pain management: pain level controlled Vital Signs Assessment: post-procedure vital signs reviewed and stable Respiratory status: spontaneous breathing, respiratory function stable and patient connected to nasal cannula oxygen Cardiovascular status: blood pressure returned to baseline and stable Postop Assessment: no headache and no signs of nausea or vomiting Anesthetic complications: no     Last Vitals:  Vitals:   04/06/16 1030 04/06/16 1035  BP: 125/63   Pulse:    Resp: 16 16  Temp:      Last Pain:  Vitals:   04/06/16 0908  TempSrc: Oral  PainSc: 4                  Coun[facility] [place]

## 2016-04-06 NOTE — Anesthesia Preprocedure Evaluation (Addendum)
Anesthesia Evaluation  Patient identified by MRN, date of birth, ID band Patient awake    Reviewed: Allergy & Precautions, NPO status , Patient's Chart, lab work & pertinent test results  Airway Mallampati: II  TM Distance: >3 FB     Dental  (+) Edentulous Upper, Edentulous Lower   Pulmonary Current Smoker,    breath sounds clear to auscultation       Cardiovascular (-) angina+ CAD, + Past MI, + Cardiac Stents, + Peripheral Vascular Disease, +CHF and + DVT   Rhythm:Regular Rate:Normal     Neuro/Psych  Headaches, PSYCHIATRIC DISORDERS Anxiety Depression CVA, No Residual Symptoms    GI/Hepatic negative GI ROS,   Endo/Other  diabetes, Poorly Controlled, Type 2, Insulin Dependent  Renal/GU      Musculoskeletal   Abdominal   Peds  Hematology   Anesthesia Other Findings eliquis stopped 03-31-16   Reproductive/Obstetrics                            Anesthesia Physical Anesthesia Plan  ASA: IV  Anesthesia Plan: General   Post-op Pain Management:    Induction: Intravenous  Airway Management Planned: LMA  Additional Equipment:   Intra-op Plan:   Post-operative Plan: Extubation in OR  Informed Consent: I have reviewed the patients History and Physical, chart, labs and discussed the procedure including the risks, benefits and alternatives for the proposed anesthesia with the patient or authorized representative who has indicated his/her understanding and acceptance.     Plan Discussed with:   Anesthesia Plan Comments: (CBG=533 in pre-op. 6u novolog SQ ordered.)       Anesthesia Quick Evaluation

## 2016-04-06 NOTE — H&P (Signed)
[name] is an 46 y.o. female. She is placed in observation as we assess her vulvar abscess and plan Incision and drainage.  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 She is very sore and has draining pinhole sites on the lateral aspects of the left labia majora, but also a chronic sinus just inferior to the left inguinal crease.  She is supposed to be on Eluquis, 5 mg bid, but has not taken x 30+ days UNTIL she took a dose 6 pm 3/14/ 18. Reviewing pharmacy recommendations for the length of time til spinal anesthesia can be considered, pt would need 48 hrs or more since last dose. She will therefore be evaluated overnight with plans for d/c after coordination of care, with plans to proceed with I & D of vulvar abscess next week, as she is afebrile and while in need of surgery , not deteriorating. Pertinent Gynecological History: Menses: post-menopausal Bleeding:  Contraception: tubal ligation DES exposure: unknown Blood transfusions: none Sexually transmitted diseases: no past history Previous GYN Procedures: tubal ligation  Last mammogram: normal Date: 10/17 Last pap:  Date:  OB History: G1, P1       Menstrual History: Menarche age: Patient's last menstrual period was 01/12/2011.      Past Medical History:  Diagnosis Date  . Anxiety   . 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 Coronary atherosclerosis of native coronary artery    DES to LAD September 2014  . Depression   . Facial cellulitis    12/2010  . Glomerulonephritis   . Headache(784.0)   . History of stroke   . Hyperlipidemia   . Insulin dependent diabetes mellitus  ([place])    History of diabetic ketoacidosis  . Ischemic cardiomyopathy    LVEF 40-45%  . Peripheral arterial disease ([place])   . Shingles    11/2010  . ST elevation myocardial infarction (STEMI) of anterior wall [facility])    Late presentation September 2014         Past Surgical History:  Procedure Laterality Date  . AMPUTATION  03/03/2011   Procedure: AMPUTATION DIGIT;  Surgeon: Newt Minion, MD;  Location: [place];  Service: Orthopedics;  Laterality: Left;  Left Foot Amputation 4th and 5th toes at MTP joint  . AMPUTATION  04/22/2011   Procedure: AMPUTATION BELOW KNEE;  Surgeon: Newt Minion, MD;  Location: [place];  Service: Orthopedics;  Laterality: Left;  Left Below Knee Amputation  . DILATION AND CURETTAGE OF UTERUS    . EMBOLECTOMY  01/29/2011   Procedure: EMBOLECTOMY;  Surgeon: Mal Misty, MD;  Location[facility] OR;  Service: Vascular;  Laterality: Left;  Left Popliteal and Tibial Embolectomy with patch angioplasty  . LEFT HEART CATHETERIZATION WITH CORONARY ANGIOGRAM N/A 10/01/2012   Procedure: LEFT HEART CATHETERIZATION WITH CORONARY ANGIOGRAM;  Surgeon: Peter M Martinique, MD;  Location: [facility] CATH LAB;  Service: Cardiovascular;  Laterality: N/A;  . LOWER EXTREMITY ANGIOGRAM N/A 01/27/2011   Procedure: LOWER EXTREMITY ANGIOGRAM;  Surgeon: Rosetta Posner, MD;  Location[facility] CATH LAB;  Service: Cardiovascular;  Laterality: N/A;  . LOWER EXTREMITY ANGIOGRAM N/A 01/28/2011   Procedure: LOWER  EXTREMITY ANGIOGRAM;  Surgeon: Conrad Regan, MD;  Location: [facility] CATH LAB;  Service: Cardiovascular;  Laterality: N/A;  . LOWER EXTREMITY ANGIOGRAM Left 01/29/2011   Procedure: LOWER EXTREMITY ANGIOGRAM;  Surgeon: Mal Misty, MD;  Location: [facility] CATH LAB;  Service: Cardiovascular;  Laterality: Left;  Marland Kitchen MULTIPLE TOOTH EXTRACTIONS    . TEE WITHOUT CARDIOVERSION  04/15/2011   Procedure: TRANSESOPHAGEAL ECHOCARDIOGRAM (TEE);  Surgeon: [name], MD;  [facility];  Service:  Cardiovascular;  Laterality: N/A;  . WRIST SURGERY     Left    Family History  Problem Relation Age of Onset  . COPD Mother     alive - 37  . Hypertension Mother   . Diabetes Mother   . Stroke Mother   . Coronary artery disease Mother   . Diabetes Father     alive - 42  . Hypertension Father   . Coronary artery disease Father   . Anesthesia problems Neg Hx     Social History:  reports that she has been smoking Cigarettes.  She started smoking about 26 years ago. She has a 10.50 pack-year smoking history. She has never used smokeless tobacco. She reports that she uses drugs, including Marijuana, about 1 time per week. She reports that she does not drink alcohol.  Allergies:       Allergies  Allergen Reactions  . Ibuprofen Other (See Comments)    Kidney disease  . Losartan Swelling           Prescriptions Prior to Admission  Medication Sig Dispense Refill Last Dose  . ALPRAZolam (XANAX) 1 MG tablet Take 1 mg by mouth 3 (three) times daily as needed for anxiety.   04/01/2016 at Unknown time  . apixaban (ELIQUIS) 5 MG TABS tablet Take 5 mg by mouth 2 (two) times daily.   03/31/2016 at 1800  . doxycycline (VIBRAMYCIN) 100 MG capsule Take 100 mg by mouth 2 (two) times daily.   04/01/2016 at Unknown time  . insulin aspart (NOVOLOG FLEXPEN) 100 UNIT/ML injection Inject 2-10 Units into the skin 3 (three) times daily before meals. Sliding scale as follows 200-250=2 units 251-300=4 units 301-350=6 units 351-400=8 units >400-give 10 units and Notify MD   04/01/2016 at Unknown time  . Insulin Glargine (LANTUS SOLOSTAR) 100 UNIT/ML SOPN Inject 15 Units into the skin at bedtime. (Patient taking differently: Inject 20 Units into the skin at bedtime. )   03/31/2016 at Unknown time  . nitroGLYCERIN (NITROSTAT) 0.4 MG SL tablet Place 1 tablet (0.4 mg total) under the tongue every 5 (five) minutes as needed for chest pain. 25 tablet 5 Taking  .  oxyCODONE-acetaminophen (PERCOCET) 10-325 MG per tablet Take 1 tablet by mouth 4 (four) times daily.   04/01/2016 at Unknown time  . metoprolol tartrate (LOPRESSOR) 25 MG tablet Take 1 tablet (25 mg total) by mouth 2 (two) times daily. (Patient not taking: Reported on 04/01/2016) 180 tablet 3 Not Taking at Unknown time    ROS  Blood pressure (!) 115/58, pulse 85, temperature 97.6 F (36.4 C), temperature source Oral, resp. rate 18, last menstrual period 01/12/2011, SpO2 97 %. Physical Exam  Constitutional: She is oriented to person, place, and time. She appears well-developed.  Appears older than stated age.  Eyes: Pupils are equal, round, and reactive to light.  Neck: Normal range of motion.  Cardiovascular: Normal rate and regular rhythm.   Respiratory: Effort normal and breath sounds normal. She has no wheezes. She has no  rales. She exhibits no tenderness.  GI: Soft. Bowel sounds are normal. She exhibits no distension and no mass. There is no tenderness. There is no rebound and no guarding.  Genitourinary: Vagina normal.  Genitourinary Comments: Vulva with firm induration and tenderness along a 5 cm wide x 12 cm long band of tissue on lateral aspect of left buttock/ labia majora.  No evidence of bartholins. The area looks less inflamed than last week, has an area of residual tenderness and ?fluctuance on the buttock area, with the sinus tract being much less indurated, and the anterior drainage site medial to the inguinal crease is firmer, less purulence expresable. Digital rectal: no abscess or interconnection to sore area on buttock Musculoskeletal: Normal range of motion.  s/p left AKA.  Neurological: She is alert and oriented to person, place, and time.  Skin: Skin is warm and dry.    Lab Results Last 24 Hours  CBC Latest Ref Rng & Units 04/06/2016 04/01/2016 02/12/2013  WBC 4.0 - 10.5 K/uL 10.0 9.8 9.9  Hemoglobin 12.0 - 15.0 g/dL 11.6(L) 11.6(L) 11.8(L)  Hematocrit 36.0 -  46.0 % 34.8(L) 33.7(L) 34.4(L)  Platelets 150 - 400 K/uL 452(H) 436(H) 458(H)      CBG (last 3)   Recent Labs  04/06/16 0827 04/06/16 0947  GLUCAP 533* 364*   Dr Patsey Berthold is managing her cbg's with insulin to get her to acceptable levels for surgery this a.m.   Imaging Results (Last 48 hours)  No results found.    Assessment/Plan: Left vulvar abscess, chronic.  Slight improvement since last Friday DM type I Chronic periph vasc disease Recent use of eluquis on 03/31/16, switched to Lovenox for the weekend. Plan: Incision and drainage of vulvar abscess. Probable overnight observation on SSI.

## 2016-04-06 NOTE — Brief Op Note (Addendum)
04/06/2016  11:43 AM  PATIENT:  [name]  46 y.o. female  PRE-OPERATIVE DIAGNOSIS:  vulvar abscess  POST-OPERATIVE DIAGNOSIS:  vulvar abscess, with sinus tract to groin   PROCEDURE:  Procedure(s): INCISION AND DRAINAGE ABSCESS (N/A) Placement of iodoform packing SURGEON:  Surgeon(s) and Role: ([id number])     * Jonnie Kind, MD - Primary  PHYSICIAN ASSISTANT:   ASSISTANTS: none   ANESTHESIA:   general and With LMA  EBL:  Total I/O In: 1350 [I.V.:1300; IV Piggyback:50] Out: 50 [Blood:50]  BLOOD ADMINISTERED:none  DRAINS: Iodoform gauze in the Inc. sinus tract   LOCAL MEDICATIONS USED:  MARCAINE    and Amount: 10 ml  SPECIMEN:  Source of Specimen:  Aerobic and anaerobic cultures  DISPOSITION OF SPECIMEN:  N/A  COUNTS:  YES  TOURNIQUET:  * No tourniquets in log *  DICTATION: .Dragon Dictation  PLAN OF CARE: Admit for overnight observation  PATIENT DISPOSITION:  PACU - hemodynamically stable.   Delay start of Pharmacological VTE agent (>24hrs) due to surgical blood loss or risk of bleeding: yes

## 2016-04-06 NOTE — Transfer of Care (Signed)
Immediate Anesthesia Transfer of Care Note  Patient: [name]  Procedure(s) Performed: Procedure(s): INCISION AND DRAINAGE ABSCESS (N/A)  Patient Location: PACU  Anesthesia Type:General  Level of Consciousness: awake, alert , oriented and patient cooperative  Airway & Oxygen Therapy: Patient Spontanous Breathing and Patient connected to nasal cannula oxygen  Post-op Assessment: Report given to RN and Post -op Vital signs reviewed and stable  Post vital signs: Reviewed and stable  Last Vitals:  Vitals:   04/06/16 1030 04/06/16 1035  BP: 125/63   Pulse:    Resp: 16 16  Temp:      Last Pain:  Vitals:   04/06/16 0908  TempSrc: Oral  PainSc: 4       Patients Stated Pain Goal: 5 (78/67/54 4920)  Complications: No apparent anesthesia complications

## 2016-04-06 NOTE — Discharge Summary (Signed)
Physician Discharge Summary  Patient ID: [name] MRN: [id number] DOB/AGE: [birth year] 46 y.o.  Admit date: 04/06/2016 Discharge date: 04/06/2016  Admission Diagnoses:gluteal abscess,                                         Type I dm                                        ASCVD            Past Medical History:  Diagnosis Date  . Anxiety   . 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 Coronary atherosclerosis of native coronary artery    DES to LAD September 2014  . Depression   . Facial cellulitis    12/2010  . Glomerulonephritis   . Headache(784.0)   . History of stroke   . Hyperlipidemia   . Insulin dependent diabetes mellitus ([place])    History of diabetic ketoacidosis  . Ischemic cardiomyopathy    LVEF 40-45%  . Peripheral arterial disease ([place])   . Shingles    11/2010  . ST elevation myocardial infarction (STEMI) of anterior wall [facility])    Late presentation September 2014     Discharge Diagnoses:  Active Problems:   Abscess of vulva drained  Discharged Condition: good  Hospital Course: admitted , thru day surgery, and was I&D'd. Iodoform wicks placed.   Pt now able to eat, pain is controlled, and it is time for outpt care Drains to  Be removed this week  Consults: None  Significant Diagnostic Studies: labs:  CBG (last 3)   Recent Labs  04/06/16 1211 04/06/16 1405 04/06/16 1625  GLUCAP 105* 125* 190*   CBC    Component Value Date/Time   WBC 10.0 04/06/2016 0827   RBC 3.90 04/06/2016 0827   HGB 11.6 (L) 04/06/2016 0827   HCT 34.8 (L) 04/06/2016 0827   PLT 452 (H) 04/06/2016 0827   MCV 89.2 04/06/2016 0827   MCH 29.7 04/06/2016 0827   MCHC 33.3 04/06/2016 0827   RDW 15.0 04/06/2016 0827   LYMPHSABS 2.8 02/12/2013 0538   MONOABS 0.7 02/12/2013 0538   EOSABS 0.2 02/12/2013 0538   BASOSABS 0.0 02/12/2013 0538      Treatments: surgery: I&D vulvar abscess  Discharge Exam: Blood  pressure (!) 129/56, pulse 77, temperature 98.3 F (36.8 C), temperature source Oral, resp. rate 18, height 5\' 1"  (1.549 m), weight 106 lb 11.2 oz (48.4 kg), last menstrual period 01/12/2011, SpO2 100 %. General appearance: alert, cooperative and no distress Resp: clear to auscultation bilaterally GI: soft, non-tender; bowel sounds normal; no masses,  no organomegaly  Disposition: 01-Home or Self Care  Discharge Instructions    Call MD for:  persistant nausea and vomiting    Complete by:  As directed    Call MD for:  redness, tenderness, or signs of infection (pain, swelling, redness, odor or green/yellow discharge around incision site)    Complete by:  As directed    Call MD for:  severe uncontrolled pain    Complete by:  As directed    Call MD for:  temperature >100.4    Complete by:  As directed    Diet - low sodium heart healthy    Complete by:  As directed    Increase activity slowly    Complete by:  As directed    Other Restrictions    Complete by:  As directed    May shower daily, topical neosporin to wound     Allergies as of 04/06/2016      Reactions   Ibuprofen Other (See Comments)   Kidney disease   Losartan Swelling      Medication List    STOP taking these medications   enoxaparin 40 MG/0.4ML injection Commonly known as:  LOVENOX     TAKE these medications   ALPRAZolam 1 MG tablet Commonly known as:  XANAX Take 1 mg by mouth 3 (three) times daily as needed for anxiety.   amoxicillin-clavulanate 875-125 MG tablet Commonly known as:  AUGMENTIN Take 1 tablet by mouth 2 (two) times daily.   doxycycline 100 MG tablet Commonly known as:  VIBRA-TABS Take 1 tablet (100 mg total) by mouth 2 (two) times daily.   Insulin Glargine 100 UNIT/ML Solostar Pen Commonly known as:  LANTUS SOLOSTAR Inject 15 Units into the skin at bedtime. What changed:  how much to take   nitroGLYCERIN 0.4 MG SL tablet Commonly known as:  NITROSTAT Place 1 tablet (0.4 mg total)  under the tongue every 5 (five) minutes as needed for chest pain.   NOVOLOG FLEXPEN 100 UNIT/ML injection Generic drug:  insulin aspart Inject 2-10 Units into the skin 3 (three) times daily before meals. Sliding scale as follows 200-250=2 units 251-300=4 units 301-350=6 units 351-400=8 units >400-give 10 units and Notify MD   oxyCODONE-acetaminophen 10-325 MG tablet Commonly known as:  PERCOCET Take 1 tablet by mouth 4 (four) times daily.   promethazine 25 MG tablet Commonly known as:  PHENERGAN Take 25 mg by mouth every 6 (six) hours as needed for nausea or vomiting.      Follow-up Information    Jonnie Kind, MD Follow up.   Specialties:  Obstetrics and Gynecology, Radiology Why:  call office , I will need to remove the drain this week, on Thursday preferably Contact information: [place] [address] [phone]           Signed: Jonnie Kind 04/06/2016, 5:48 PM

## 2016-04-06 NOTE — Progress Notes (Signed)
During discharging patient, patient was instructed to wait until lab came up to draw her blood for a test the provider ordered before she left. Patient verbalized understanding and that she would wait for the lab to come draw her blood. After leaving patient's room, patient at some point left with belongings before lab could draw blood work. Attempted to call patient to request her to come back for the test, no answer.

## 2016-04-06 NOTE — Op Note (Signed)
04/06/2016  11:43 AM  PATIENT:  [name]  46 y.o. female  PRE-OPERATIVE DIAGNOSIS:  vulvar abscess  POST-OPERATIVE DIAGNOSIS:  vulvar abscess, with sinus tract to groin   PROCEDURE:  Procedure(s): INCISION AND DRAINAGE ABSCESS (N/A) Placement of iodoform packing SURGEON:  Surgeon(s) and Role: (01751)     * Jonnie Kind, MD - Primary  PHYSICIAN ASSISTANT:   ASSISTANTS: none   ANESTHESIA:   general and With LMA  EBL:  Total I/O In: 1350 [I.V.:1300; IV Piggyback:50] Out: 50 [Blood:50]  BLOOD ADMINISTERED:none  DRAINS: Iodoform gauze in the Inc. sinus tract   LOCAL MEDICATIONS USED:  MARCAINE    and Amount: 10 ml  SPECIMEN:  Source of Specimen:  Aerobic and anaerobic cultures  DISPOSITION OF SPECIMEN:  N/A  COUNTS:  YES  TOURNIQUET:  * No tourniquets in log *  DICTATION: .Dragon Dictation  PLAN OF CARE: Admit for overnight observation  PATIENT DISPOSITION:  PACU - hemodynamically stable.   Delay start of Pharmacological VTE agent (>24hrs) due to surgical blood loss or risk of bleeding: yes  Details of procedure: Patient was taken operating room prepped and draped for vaginal procedure with legs in candycane support timeout was conducted. Mefoxin 2 g was administered. The perineum was dramatically improved from 3 days ago. In talking with the family preoperatively it appears the patient may have given false history of actually taking her antibiotics until recently. The draining sinus tract just below the groin on the patient left was excised in an elliptical fashion and a 1 cm long incision by 6 mm with a in the sinus tract could then be cannulated with a hemostat reaching all the way down near the ischial tuberosity the sinus tract was quite superficial down to even with the care fourchette and then it tended to go deeper. A second opening was made aware the tract began to get deep. This is approximately 2 cm in length. The base of the sinus tract was debrided.  There was less granulation tissue that was extracted iodoform gauze, double looped was threaded through the sinus tract from the groin to the second opening and then tied externally so that it could not fall out or be dislodged. Posteriorly the sinus tract in the traced into the deeper fatty tissue reaching to approximately 3 cm from the anus. Digital rectal exam was double gloved hand was performed and we could not find any thickening to suggest that this was a perirectal abscess a third opening was made at the inferior most end of the sinus tract DNA second iodoform double looped was threaded through this third opening and looped from there to the second opening externally in such a way that it cannot fall out. A single stitch was placed in the 2 cm central opening pulling the 2 edges side to side making it into 2 separate 1 cm openings for improved ease of healing by secondary intention. Patient was allowed to awaken and go to recovery room after an additional 10 cc of Marcaine was administered injected around the surgical site. Sponge and needle counts were correct and patient will be observed overnight due to family's reluctance to provide support.

## 2016-04-07 ENCOUNTER — Encounter (HOSPITAL_COMMUNITY): Payer: Self-pay | Admitting: Obstetrics and Gynecology

## 2016-04-08 ENCOUNTER — Encounter: Payer: Self-pay | Admitting: Obstetrics and Gynecology

## 2016-04-08 ENCOUNTER — Ambulatory Visit (INDEPENDENT_AMBULATORY_CARE_PROVIDER_SITE_OTHER): Payer: Medicaid Other | Admitting: Obstetrics and Gynecology

## 2016-04-08 VITALS — BP 120/60 | HR 80 | Wt 101.0 lb

## 2016-04-08 DIAGNOSIS — N764 Abscess of vulva: Secondary | ICD-10-CM

## 2016-04-08 DIAGNOSIS — Z9889 Other specified postprocedural states: Secondary | ICD-10-CM

## 2016-04-08 DIAGNOSIS — Z4803 Encounter for change or removal of drains: Secondary | ICD-10-CM

## 2016-04-08 DIAGNOSIS — Z09 Encounter for follow-up examination after completed treatment for conditions other than malignant neoplasm: Secondary | ICD-10-CM

## 2016-04-08 NOTE — Progress Notes (Signed)
   Subjective:  [name] is a 46 y.o. female now 2 days status post I & D of vulvar abscess. she is her for removal of iodoform wick x 2.    pt is on antibiotics doxycycline and flagyl Review of Systems Negative except pt expects a yeast infxn   Diet:   Cho modified,    Bowel movements : normal.  Pain is controlled without any medications.  Objective:  BP 120/60   Pulse 80   Wt 101 lb (45.8 kg)   LMP 01/12/2011   BMI 19.08 kg/m  General:Well developed, well nourished.  No acute distress. Abdomen: Bowel sounds normal, soft, non-tender. Pelvic Exam:    External Genitalia:  Iodoform wicks removed.     Vagina: Normal    Cervix: Normal    Uterus: not checked    Adnexa/Bimanual: not checked  Incision(s):   Healing well, no drainage, no erythema, no hernia, no swelling, no dehiscence,  will allow drain sites to heal by secondary intention.   Assessment:  Post-Op 2 days s/p I & D vulvar abscess with sinus tract to groin   doing well postoperatively.   Plan:  1.Wound care discussed  Will add diflucan 2. . current medications.continued 3. Activity restrictions: may shower 4. return to work: not applicable. 5. Follow up in 4 week.

## 2016-04-11 LAB — AEROBIC/ANAEROBIC CULTURE W GRAM STAIN (SURGICAL/DEEP WOUND)

## 2016-04-11 LAB — AEROBIC/ANAEROBIC CULTURE (SURGICAL/DEEP WOUND): Culture: NORMAL

## 2016-11-06 ENCOUNTER — Inpatient Hospital Stay (HOSPITAL_COMMUNITY)
Admission: EM | Admit: 2016-11-06 | Discharge: 2016-11-13 | DRG: 438 | Disposition: A | Discharge status: Home / Self Care | Payer: Medicaid Other | Attending: Pulmonary Disease | Admitting: Pulmonary Disease

## 2016-11-06 ENCOUNTER — Encounter (HOSPITAL_COMMUNITY): Payer: Self-pay | Admitting: *Deleted

## 2016-11-06 ENCOUNTER — Emergency Department (HOSPITAL_COMMUNITY): Payer: Medicaid Other

## 2016-11-06 DIAGNOSIS — G8929 Other chronic pain: Secondary | ICD-10-CM | POA: Diagnosis present

## 2016-11-06 DIAGNOSIS — E1051 Type 1 diabetes mellitus with diabetic peripheral angiopathy without gangrene: Secondary | ICD-10-CM | POA: Diagnosis present

## 2016-11-06 DIAGNOSIS — E785 Hyperlipidemia, unspecified: Secondary | ICD-10-CM | POA: Diagnosis present

## 2016-11-06 DIAGNOSIS — K5903 Drug induced constipation: Secondary | ICD-10-CM | POA: Diagnosis present

## 2016-11-06 DIAGNOSIS — I743 Embolism and thrombosis of arteries of the lower extremities: Secondary | ICD-10-CM | POA: Diagnosis present

## 2016-11-06 DIAGNOSIS — Z8673 Personal history of transient ischemic attack (TIA), and cerebral infarction without residual deficits: Secondary | ICD-10-CM

## 2016-11-06 DIAGNOSIS — I739 Peripheral vascular disease, unspecified: Secondary | ICD-10-CM | POA: Diagnosis present

## 2016-11-06 DIAGNOSIS — K859 Acute pancreatitis without necrosis or infection, unspecified: Secondary | ICD-10-CM | POA: Diagnosis present

## 2016-11-06 DIAGNOSIS — Z7901 Long term (current) use of anticoagulants: Secondary | ICD-10-CM

## 2016-11-06 DIAGNOSIS — Z89512 Acquired absence of left leg below knee: Secondary | ICD-10-CM

## 2016-11-06 DIAGNOSIS — G546 Phantom limb syndrome with pain: Secondary | ICD-10-CM | POA: Diagnosis present

## 2016-11-06 DIAGNOSIS — Z23 Encounter for immunization: Secondary | ICD-10-CM

## 2016-11-06 DIAGNOSIS — Z823 Family history of stroke: Secondary | ICD-10-CM

## 2016-11-06 DIAGNOSIS — I252 Old myocardial infarction: Secondary | ICD-10-CM

## 2016-11-06 DIAGNOSIS — F17210 Nicotine dependence, cigarettes, uncomplicated: Secondary | ICD-10-CM | POA: Diagnosis present

## 2016-11-06 DIAGNOSIS — K8500 Idiopathic acute pancreatitis without necrosis or infection: Principal | ICD-10-CM | POA: Diagnosis present

## 2016-11-06 DIAGNOSIS — Z794 Long term (current) use of insulin: Secondary | ICD-10-CM

## 2016-11-06 DIAGNOSIS — IMO0001 Reserved for inherently not codable concepts without codable children: Secondary | ICD-10-CM

## 2016-11-06 DIAGNOSIS — Z886 Allergy status to analgesic agent status: Secondary | ICD-10-CM

## 2016-11-06 DIAGNOSIS — Z86718 Personal history of other venous thrombosis and embolism: Secondary | ICD-10-CM

## 2016-11-06 DIAGNOSIS — Z79891 Long term (current) use of opiate analgesic: Secondary | ICD-10-CM

## 2016-11-06 DIAGNOSIS — I255 Ischemic cardiomyopathy: Secondary | ICD-10-CM | POA: Diagnosis present

## 2016-11-06 DIAGNOSIS — G9341 Metabolic encephalopathy: Secondary | ICD-10-CM | POA: Diagnosis not present

## 2016-11-06 DIAGNOSIS — F329 Major depressive disorder, single episode, unspecified: Secondary | ICD-10-CM | POA: Diagnosis present

## 2016-11-06 DIAGNOSIS — Z833 Family history of diabetes mellitus: Secondary | ICD-10-CM

## 2016-11-06 DIAGNOSIS — Z888 Allergy status to other drugs, medicaments and biological substances status: Secondary | ICD-10-CM

## 2016-11-06 DIAGNOSIS — I2510 Atherosclerotic heart disease of native coronary artery without angina pectoris: Secondary | ICD-10-CM | POA: Diagnosis present

## 2016-11-06 DIAGNOSIS — Z8249 Family history of ischemic heart disease and other diseases of the circulatory system: Secondary | ICD-10-CM

## 2016-11-06 DIAGNOSIS — Z720 Tobacco use: Secondary | ICD-10-CM | POA: Diagnosis present

## 2016-11-06 DIAGNOSIS — F419 Anxiety disorder, unspecified: Secondary | ICD-10-CM | POA: Diagnosis present

## 2016-11-06 DIAGNOSIS — E118 Type 2 diabetes mellitus with unspecified complications: Secondary | ICD-10-CM

## 2016-11-06 DIAGNOSIS — T50995A Adverse effect of other drugs, medicaments and biological substances, initial encounter: Secondary | ICD-10-CM | POA: Diagnosis present

## 2016-11-06 LAB — BLOOD GAS, VENOUS
Acid-Base Excess: 0.4 mmol/L (ref 0.0–2.0)
Bicarbonate: 22.6 mmol/L (ref 20.0–28.0)
O2 Saturation: 49.2 %
pCO2, Ven: 44.7 mmHg (ref 44.0–60.0)
pH, Ven: 7.356 (ref 7.250–7.430)

## 2016-11-06 LAB — COMPREHENSIVE METABOLIC PANEL
ALT: 12 U/L — ABNORMAL LOW (ref 14–54)
AST: 21 U/L (ref 15–41)
Albumin: 3.2 g/dL — ABNORMAL LOW (ref 3.5–5.0)
Alkaline Phosphatase: 122 U/L (ref 38–126)
Anion gap: 15 (ref 5–15)
BUN: 31 mg/dL — ABNORMAL HIGH (ref 6–20)
CO2: 25 mmol/L (ref 22–32)
Calcium: 9.4 mg/dL (ref 8.9–10.3)
Chloride: 98 mmol/L — ABNORMAL LOW (ref 101–111)
Creatinine, Ser: 1.46 mg/dL — ABNORMAL HIGH (ref 0.44–1.00)
GFR calc Af Amer: 48 mL/min — ABNORMAL LOW (ref >60)
GFR calc non Af Amer: 42 mL/min — ABNORMAL LOW (ref >60)
Glucose, Bld: 380 mg/dL — ABNORMAL HIGH (ref 65–99)
Potassium: 3.9 mmol/L (ref 3.5–5.1)
Sodium: 138 mmol/L (ref 135–145)
Total Bilirubin: 1 mg/dL (ref 0.3–1.2)
Total Protein: 7.1 g/dL (ref 6.5–8.1)

## 2016-11-06 LAB — CBC WITH DIFFERENTIAL/PLATELET
Basophils Absolute: 0 10*3/uL (ref 0.0–0.1)
Basophils Relative: 0 %
Eosinophils Absolute: 0 10*3/uL (ref 0.0–0.7)
Eosinophils Relative: 0 %
HCT: 40 % (ref 36.0–46.0)
Hemoglobin: 14.1 g/dL (ref 12.0–15.0)
Lymphocytes Relative: 12 %
Lymphs Abs: 1.4 10*3/uL (ref 0.7–4.0)
MCH: 28.8 pg (ref 26.0–34.0)
MCHC: 35.3 g/dL (ref 30.0–36.0)
MCV: 81.6 fL (ref 78.0–100.0)
Monocytes Absolute: 0.2 10*3/uL (ref 0.1–1.0)
Monocytes Relative: 2 %
Neutro Abs: 10 10*3/uL — ABNORMAL HIGH (ref 1.7–7.7)
Neutrophils Relative %: 86 %
Platelets: 415 10*3/uL — ABNORMAL HIGH (ref 150–400)
RBC: 4.9 MIL/uL (ref 3.87–5.11)
RDW: 13 % (ref 11.5–15.5)
WBC: 11.5 10*3/uL — ABNORMAL HIGH (ref 4.0–10.5)

## 2016-11-06 LAB — CBG MONITORING, ED
Glucose-Capillary: 439 mg/dL — ABNORMAL HIGH (ref 65–99)
Glucose-Capillary: 286 mg/dL — ABNORMAL HIGH (ref 65–99)

## 2016-11-06 LAB — LIPASE, BLOOD: Lipase: 1797 U/L — ABNORMAL HIGH (ref 11–51)

## 2016-11-06 MED ORDER — HYDROMORPHONE HCL 1 MG/ML IJ SOLN
1.0000 mg | Freq: Once | INTRAMUSCULAR | Status: AC
  Administered 2016-11-06: 1 mg via INTRAVENOUS
  Filled 2016-11-06: qty 1

## 2016-11-06 MED ORDER — PROMETHAZINE HCL 25 MG/ML IJ SOLN
12.5000 mg | Freq: Once | INTRAMUSCULAR | Status: AC
  Administered 2016-11-06: 12.5 mg via INTRAVENOUS
  Filled 2016-11-06: qty 1

## 2016-11-06 MED ORDER — IOPAMIDOL (ISOVUE-300) INJECTION 61%
100.0000 mL | Freq: Once | INTRAVENOUS | Status: AC | PRN
  Administered 2016-11-06: 100 mL via INTRAVENOUS

## 2016-11-06 MED ORDER — IOPAMIDOL (ISOVUE-300) INJECTION 61%
INTRAVENOUS | Status: AC
  Administered 2016-11-06: 30 mL
  Filled 2016-11-06: qty 30

## 2016-11-06 MED ORDER — SODIUM CHLORIDE 0.9 % IV BOLUS (SEPSIS)
1000.0000 mL | Freq: Once | INTRAVENOUS | Status: AC
  Administered 2016-11-06: 1000 mL via INTRAVENOUS

## 2016-11-06 MED ORDER — FENTANYL CITRATE (PF) 100 MCG/2ML IJ SOLN
50.0000 ug | Freq: Once | INTRAMUSCULAR | Status: AC
  Administered 2016-11-06: 50 ug via INTRAVENOUS
  Filled 2016-11-06: qty 2

## 2016-11-06 MED ORDER — INSULIN ASPART 100 UNIT/ML ~~LOC~~ SOLN
10.0000 [IU] | Freq: Once | SUBCUTANEOUS | Status: AC
  Administered 2016-11-06: 10 [IU] via INTRAVENOUS
  Filled 2016-11-06: qty 1

## 2016-11-06 MED ORDER — METOCLOPRAMIDE HCL 5 MG/ML IJ SOLN
10.0000 mg | Freq: Once | INTRAMUSCULAR | Status: AC
  Administered 2016-11-06: 10 mg via INTRAVENOUS
  Filled 2016-11-06: qty 2

## 2016-11-06 NOTE — ED Triage Notes (Signed)
Pt c/o n/v/d, right upper quad pain that started yesterday,

## 2016-11-06 NOTE — ED Notes (Signed)
Pt stated she does not need to urinate at this time, aware of DO

## 2016-11-06 NOTE — ED Notes (Signed)
Respiratory paged d/t VBG specimen available.

## 2016-11-06 NOTE — ED Notes (Signed)
Pt actively vomiting at this time and requesting pain medication. MD Mesner notified.

## 2016-11-06 NOTE — ED Provider Notes (Signed)
Emergency Department Provider Note   I have reviewed the triage vital signs and the nursing notes.   HISTORY  Chief Complaint Emesis   HPI [name] is a 46 y.o. female with a history of atrial thromboembolisms, coronary artery disease, hyperlipidemia, insulin-dependent diabetes and diabetic ketoacidosis the presents to the emergency department today secondary to right upper quadrant and right lower quadrant abdominal pain associated with nonbloody nonbilious vomiting for at least 24 hours.  States that she had vomiting before with DKA but has not had abdominal pain similar to this.  She has had diarrhea off and on as well.  No history of abdominal surgeries.  Her blood sugars have been high at home but she says she has been taking her insulin but none of her p.o. medications.  No fevers or coughing.  No rashes or trauma.  No other associated modifying symptoms.  Has not tried anything else for the symptoms as she could not keep her Phenergan down.  Past Medical History:  Diagnosis Date  . Anxiety   . Arterial thromboembolism ([place])    Prior embolectomy, previously on Coumadin  . Coronary atherosclerosis of native coronary artery    DES to LAD September 2014  . Depression   . Facial cellulitis    12/2010  . Glomerulonephritis   . Headache(784.0)   . History of stroke   . Hyperlipidemia   . Insulin dependent diabetes mellitus ([place])    History of diabetic ketoacidosis  . Ischemic cardiomyopathy    LVEF 40-45%  . Peripheral arterial disease ([place])   . Shingles    11/2010  . ST elevation myocardial infarction (STEMI) of anterior wall [facility])    Late presentation September 2014    Patient Active Problem List   Diagnosis Date Noted  . Pancreatitis 11/07/2016  . Abscess of vulva 04/01/2016  . PAD (peripheral artery disease) ([place]) 11/12/2014  . Coronary atherosclerosis of native coronary artery 02/20/2013  . Cardiomyopathy, ischemic 02/20/2013  . History of stroke  04/13/2011  . Thromboembolism of lower extremity artery ([place]) 02/09/2011  . Peripheral arterial disease ([place])   . Hyperlipidemia   . Tobacco abuse   . Insulin dependent diabetes mellitus with complications ([place]) 20/10/710    Past Surgical History:  Procedure Laterality Date  . AMPUTATION  03/03/2011   Procedure: AMPUTATION DIGIT;  Surgeon: Newt Minion, MD;  Location: [place];  Service: Orthopedics;  Laterality: Left;  Left Foot Amputation 4th and 5th toes at MTP joint  . AMPUTATION  04/22/2011   Procedure: AMPUTATION BELOW KNEE;  Surgeon: Newt Minion, MD;  Location: [place];  Service: Orthopedics;  Laterality: Left;  Left Below Knee Amputation  . DILATION AND CURETTAGE OF UTERUS    . EMBOLECTOMY  01/29/2011   Procedure: EMBOLECTOMY;  Surgeon: Mal Misty, MD;  Location: [facility] OR;  Service: Vascular;  Laterality: Left;  Left Popliteal and Tibial Embolectomy with patch angioplasty  . INCISION AND DRAINAGE ABSCESS N/A 04/06/2016   Procedure: INCISION AND DRAINAGE ABSCESS;  Surgeon: Jonnie Kind, MD;  Location: [facility];  Service: Gynecology;  Laterality: N/A;  . LEFT HEART CATHETERIZATION WITH CORONARY ANGIOGRAM N/A 10/01/2012   Procedure: LEFT HEART CATHETERIZATION WITH CORONARY ANGIOGRAM;  Surgeon: Peter M Martinique, MD;  Location: [facility] CATH LAB;  Service: Cardiovascular;  Laterality: N/A;  . LOWER EXTREMITY ANGIOGRAM N/A 01/27/2011   Procedure: LOWER EXTREMITY ANGIOGRAM;  Surgeon: Rosetta Posner, MD;  Location: [facility] CATH LAB;  Service: Cardiovascular;  Laterality: N/A;  .  LOWER EXTREMITY ANGIOGRAM N/A 01/28/2011   Procedure: LOWER EXTREMITY ANGIOGRAM;  Surgeon: Conrad Greenfield, MD;  Location: [facility] CATH LAB;  Service: Cardiovascular;  Laterality: N/A;  . LOWER EXTREMITY ANGIOGRAM Left 01/29/2011   Procedure: LOWER EXTREMITY ANGIOGRAM;  Surgeon: Mal Misty, MD;  Location: [facility] CATH LAB;  Service: Cardiovascular;  Laterality: Left;  Marland Kitchen MULTIPLE TOOTH EXTRACTIONS    . TEE WITHOUT CARDIOVERSION  04/15/2011    Procedure: TRANSESOPHAGEAL ECHOCARDIOGRAM (TEE);  Surgeon: [name], MD;  [facility];  Service: Cardiovascular;  Laterality: N/A;  . WRIST SURGERY     Left      Allergies Ibuprofen and Losartan  Family History  Problem Relation Age of Onset  . COPD Mother        alive - 36  . Hypertension Mother   . Diabetes Mother   . Stroke Mother   . Coronary artery disease Mother   . Diabetes Father        alive - 41  . Hypertension Father   . Coronary artery disease Father   . Anesthesia problems Neg Hx     Social History Social History  Substance Use Topics  . Smoking status: Current Every Day Smoker    Packs/day: 0.50    Years: 21.00    Types: Cigarettes    Start date: 07/06/1989    Last attempt to quit: 01/30/2011  . Smokeless tobacco: Never Used  . Alcohol use No     Comment: rarely    Review of Systems  All other systems negative except as documented in the HPI. All pertinent positives and negatives as reviewed in the HPI. ____________________________________________   PHYSICAL EXAM:  VITAL SIGNS: ED Triage Vitals  Enc Vitals Group     BP 11/06/16 2045 120/71     Pulse Rate 11/06/16 2045 (!) 150     Resp 11/06/16 2045 20     Temp 11/06/16 2045 98.7 F (37.1 C)     Temp Source 11/06/16 2045 Oral     SpO2 11/06/16 2045 99 %     Weight 11/06/16 2049 98 lb (44.5 kg)     Height 11/06/16 2049 5\' 1"  (1.549 m)     Head Circumference --      Peak Flow --      Pain Score 11/06/16 2046 10     Pain Loc --      Pain Edu? --      Excl. in [place]? --     Constitutional: Alert and oriented. Well appearing and in no acute distress. Eyes: Conjunctivae are normal. PERRL. EOMI. Head: Atraumatic. Nose: No congestion/rhinnorhea. Mouth/Throat: Mucous membranes are moist.  Oropharynx non-erythematous. Neck: No stridor.  No meningeal signs.   Cardiovascular: tachycardic rate, regular rhythm. Good peripheral circulation. Grossly normal heart sounds.     Respiratory: tachypneic respiratory effort.  No retractions. Lungs CTAB. Gastrointestinal: Soft and tender to ruq/rlq. No distention.  Musculoskeletal: No lower extremity tenderness nor edema. No gross deformities of extremities. Neurologic:  Normal speech and language. No gross focal neurologic deficits are appreciated.  Skin:  Skin is warm, dry and intact. No rash noted.   ____________________________________________   LABS (all labs ordered are listed, but only abnormal results are displayed)  Labs Reviewed  CBC WITH DIFFERENTIAL/PLATELET - Abnormal; Notable for the following:       Result Value   WBC 11.5 (*)    Platelets 415 (*)    Neutro Abs 10.0 (*)    All other  components within normal limits  COMPREHENSIVE METABOLIC PANEL - Abnormal; Notable for the following:    Chloride 98 (*)    Glucose, Bld 380 (*)    BUN 31 (*)    Creatinine, Ser 1.46 (*)    Albumin 3.2 (*)    ALT 12 (*)    GFR calc non Af Amer 42 (*)    GFR calc Af Amer 48 (*)    All other components within normal limits  LIPASE, BLOOD - Abnormal; Notable for the following:    Lipase 1,797 (*)    All other components within normal limits  URINALYSIS, ROUTINE W REFLEX MICROSCOPIC - Abnormal; Notable for the following:    Specific Gravity, Urine >1.046 (*)    Glucose, UA >=500 (*)    Hgb urine dipstick SMALL (*)    Ketones, ur 5 (*)    Protein, ur >=300 (*)    Bacteria, UA RARE (*)    Squamous Epithelial / LPF 0-5 (*)    All other components within normal limits  COMPREHENSIVE METABOLIC PANEL - Abnormal; Notable for the following:    Sodium 133 (*)    Chloride 100 (*)    CO2 20 (*)    Glucose, Bld 391 (*)    BUN 24 (*)    Calcium 8.0 (*)    Total Protein 5.4 (*)    Albumin 2.4 (*)    AST 13 (*)    ALT 10 (*)    All other components within normal limits  LIPASE, BLOOD - Abnormal; Notable for the following:    Lipase 700 (*)    All other components within normal limits  GLUCOSE, CAPILLARY -  Abnormal; Notable for the following:    Glucose-Capillary 425 (*)    All other components within normal limits  LIPID PANEL - Abnormal; Notable for the following:    Triglycerides 152 (*)    HDL 39 (*)    All other components within normal limits  GLUCOSE, CAPILLARY - Abnormal; Notable for the following:    Glucose-Capillary 167 (*)    All other components within normal limits  CBG MONITORING, ED - Abnormal; Notable for the following:    Glucose-Capillary 439 (*)    All other components within normal limits  CBG MONITORING, ED - Abnormal; Notable for the following:    Glucose-Capillary 286 (*)    All other components within normal limits  CBG MONITORING, ED - Abnormal; Notable for the following:    Glucose-Capillary 306 (*)    All other components within normal limits  BLOOD GAS, VENOUS  HEMOGLOBIN A1C  HIV ANTIBODY (ROUTINE TESTING)   ____________________________________________  EKG   EKG Interpretation  Date/Time:  Saturday November 06 2016 21:02:27 EDT Ventricular Rate:  141 PR Interval:    QRS Duration: 96 QT Interval:  298 QTC Calculation: 457 R Axis:   -68 Text Interpretation:  Sinus tachycardia Consider right atrial enlargement Left anterior fascicular block LVH with secondary repolarization abnormality Anterior infarct, old Baseline wander in lead(s) III V1 Confirmed by Merrily Pew [phone]) on 11/06/2016 11:52:56 PM       ____________________________________________  RADIOLOGY  Ct Abdomen Pelvis W Contrast  Result Date: 11/07/2016 CLINICAL DATA:  Nausea, vomiting, diarrhea, and right upper quadrant pain starting yesterday. EXAM: CT ABDOMEN AND PELVIS WITH CONTRAST TECHNIQUE: Multidetector CT imaging of the abdomen and pelvis was performed using the standard protocol following bolus administration of intravenous contrast. CONTRAST:  76mL ISOVUE-300 IOPAMIDOL (ISOVUE-300) INJECTION 61%, 178mL ISOVUE-300 IOPAMIDOL (ISOVUE-300) INJECTION  61% COMPARISON:   08/11/2010 FINDINGS: Lower chest: The lung bases are clear. Hepatobiliary: No focal liver abnormality is seen. No gallstones, gallbladder wall thickening, or biliary dilatation. Pancreas: Unremarkable. No pancreatic ductal dilatation or surrounding inflammatory changes. Spleen: Normal in size without focal abnormality. Adrenals/Urinary Tract: No adrenal gland nodules. Cyst in the upper pole right kidney. No solid renal mass lesions. Nephrograms are symmetrical. No hydronephrosis or hydroureter. Bladder wall is not thickened and no bladder filling defects are identified. Stomach/Bowel: Stomach and small bowel are mostly decompressed. Contrast material is demonstrated in the distal small bowel suggesting no evidence of obstruction. Scattered stool in the colon. Colon is mostly decompressed as well. No inflammatory bowel changes are demonstrated. The appendix is normal. Vascular/Lymphatic: Diffuse calcification of the abdominal aorta without aneurysm. Possible calcific stenosis of the iliac arteries bilaterally. The iliac arteries and external iliac arteries remain patent. Reproductive: Uterus and bilateral adnexa are unremarkable. Other: No free air or free fluid in the abdomen. No loculated collections. Abdominal wall musculature appears intact. Musculoskeletal: No acute or significant osseous findings. IMPRESSION: No acute process demonstrated in the abdomen or pelvis. No evidence of bowel obstruction or inflammation. Diffuse aortic atherosclerosis with possible calcific stenosis in the iliac arteries. Electronically Signed   By: Lucienne Capers M.D.   On: 11/07/2016 00:08   US Abdomen Limited  Result Date: 11/07/2016 CLINICAL DATA:  Evaluate for gallstones.  Acute pancreatitis. EXAM: ULTRASOUND ABDOMEN LIMITED RIGHT UPPER QUADRANT COMPARISON:  CT scan from earlier today FINDINGS: Gallbladder: No gallstones or wall thickening visualized. No sonographic Murphy sign noted by sonographer. Common bile duct:  Diameter: 4.2 mm Liver: No focal lesion identified. Within normal limits in parenchymal echogenicity. Portal vein is patent on color Doppler imaging with normal direction of blood flow towards the liver. IMPRESSION: Normal study.  No cholelithiasis. Electronically Signed   By: Dorise Bullion III M.D   On: 11/07/2016 12:43    ____________________________________________   PROCEDURES  Procedure(s) performed:   Procedures   ____________________________________________   INITIAL IMPRESSION / ASSESSMENT AND PLAN / ED COURSE  Pertinent labs & imaging results that were available during my care of the patient were reviewed by me and considered in my medical decision making (see chart for details).  Suspect some type of intra-abdominal pathology versus gastroenteritis versus DKA.  Will get a CT scan and give her meds and insulin at this time.  We will also give her pain medicine and anti-nausea medication with frequent rechecks for her significant abdominal pain.  Labs showing lipemia c/w pancreatitis as likely cause. Ct without clue to why it happene3d. HR improved with pain control and 2L fluid but still with nausea and some pain. Will admit for further control.  ____________________________________________  FINAL CLINICAL IMPRESSION(S) / ED DIAGNOSES  Final diagnoses:  Pancreatitis, acute     MEDICATIONS GIVEN DURING THIS VISIT:  Medications  ALPRAZolam (XANAX) tablet 1 mg (1 mg Oral Given 11/07/16 0939)  insulin glargine (LANTUS) injection 10 Units (not administered)  acetaminophen (TYLENOL) tablet 650 mg (not administered)    Or  acetaminophen (TYLENOL) suppository 650 mg (not administered)  ondansetron (ZOFRAN) tablet 4 mg (not administered)    Or  ondansetron (ZOFRAN) injection 4 mg (not administered)  HYDROmorphone (DILAUDID) injection 1 mg (1 mg Intravenous Given 11/07/16 1325)  apixaban (ELIQUIS) tablet 5 mg (5 mg Oral Given 11/07/16 0834)  pneumococcal 23 valent  vaccine (PNU-IMMUNE) injection 0.5 mL (not administered)  insulin aspart (novoLOG) injection 0-5 Units (not administered)  lactated  ringers infusion ( Intravenous New Bag/Given 11/07/16 1324)  insulin aspart (novoLOG) injection 0-15 Units (3 Units Subcutaneous Given 11/07/16 1256)  pantoprazole (PROTONIX) injection 40 mg (40 mg Intravenous Given 11/07/16 1257)  sodium chloride 0.9 % bolus 1,000 mL (0 mLs Intravenous Stopped 11/06/16 2333)  promethazine (PHENERGAN) injection 12.5 mg (12.5 mg Intravenous Given 11/06/16 2114)  fentaNYL (SUBLIMAZE) injection 50 mcg (50 mcg Intravenous Given 11/06/16 2113)  insulin aspart (novoLOG) injection 10 Units (10 Units Intravenous Given 11/06/16 2114)  iopamidol (ISOVUE-300) 61 % injection 100 mL (100 mLs Intravenous Contrast Given 11/06/16 2326)  iopamidol (ISOVUE-300) 61 % injection (30 mLs  Contrast Given 11/06/16 2326)  HYDROmorphone (DILAUDID) injection 1 mg (1 mg Intravenous Given 11/06/16 2202)  metoCLOPramide (REGLAN) injection 10 mg (10 mg Intravenous Given 11/06/16 2202)  HYDROmorphone (DILAUDID) injection 1 mg (1 mg Intravenous Given 11/07/16 0046)  sodium chloride 0.9 % bolus 1,000 mL (0 mLs Intravenous Stopped 11/07/16 0258)  insulin aspart (novoLOG) injection 3 Units (3 Units Subcutaneous Given 11/07/16 0834)     NEW OUTPATIENT MEDICATIONS STARTED DURING THIS VISIT:  Current Discharge Medication List      Note:  This document was prepared using Dragon voice recognition software and may include unintentional dictation errors.  Merrily Pew, MD 11/07/16 [phone]

## 2016-11-07 ENCOUNTER — Observation Stay (HOSPITAL_COMMUNITY): Payer: Medicaid Other

## 2016-11-07 DIAGNOSIS — K859 Acute pancreatitis without necrosis or infection, unspecified: Secondary | ICD-10-CM

## 2016-11-07 DIAGNOSIS — K8500 Idiopathic acute pancreatitis without necrosis or infection: Secondary | ICD-10-CM | POA: Diagnosis not present

## 2016-11-07 DIAGNOSIS — E119 Type 2 diabetes mellitus without complications: Secondary | ICD-10-CM

## 2016-11-07 DIAGNOSIS — Z794 Long term (current) use of insulin: Secondary | ICD-10-CM

## 2016-11-07 HISTORY — DX: Acute pancreatitis without necrosis or infection, unspecified: K85.90

## 2016-11-07 LAB — LIPID PANEL
Cholesterol: 162 mg/dL (ref 0–200)
HDL: 39 mg/dL — ABNORMAL LOW (ref >40)
LDL Cholesterol: 93 mg/dL (ref 0–99)
Total CHOL/HDL Ratio: 4.2 RATIO
Triglycerides: 152 mg/dL — ABNORMAL HIGH (ref <150)
VLDL: 30 mg/dL (ref 0–40)

## 2016-11-07 LAB — COMPREHENSIVE METABOLIC PANEL
ALT: 10 U/L — ABNORMAL LOW (ref 14–54)
AST: 13 U/L — ABNORMAL LOW (ref 15–41)
Albumin: 2.4 g/dL — ABNORMAL LOW (ref 3.5–5.0)
Alkaline Phosphatase: 92 U/L (ref 38–126)
Anion gap: 13 (ref 5–15)
BUN: 24 mg/dL — ABNORMAL HIGH (ref 6–20)
CO2: 20 mmol/L — ABNORMAL LOW (ref 22–32)
Calcium: 8 mg/dL — ABNORMAL LOW (ref 8.9–10.3)
Chloride: 100 mmol/L — ABNORMAL LOW (ref 101–111)
Creatinine, Ser: 0.99 mg/dL (ref 0.44–1.00)
GFR calc Af Amer: >60 mL/min (ref >60)
GFR calc non Af Amer: >60 mL/min (ref >60)
Glucose, Bld: 391 mg/dL — ABNORMAL HIGH (ref 65–99)
Potassium: 3.7 mmol/L (ref 3.5–5.1)
Sodium: 133 mmol/L — ABNORMAL LOW (ref 135–145)
Total Bilirubin: 0.9 mg/dL (ref 0.3–1.2)
Total Protein: 5.4 g/dL — ABNORMAL LOW (ref 6.5–8.1)

## 2016-11-07 LAB — URINALYSIS, ROUTINE W REFLEX MICROSCOPIC
Bilirubin Urine: NEGATIVE
Glucose, UA: >=500 mg/dL — AB
Ketones, ur: 5 mg/dL — AB
Leukocytes, UA: NEGATIVE
Nitrite: NEGATIVE
Protein, ur: >=300 mg/dL — AB
Specific Gravity, Urine: >1.046 — ABNORMAL HIGH (ref 1.005–1.030)
pH: 5 (ref 5.0–8.0)

## 2016-11-07 LAB — LIPASE, BLOOD: Lipase: 700 U/L — ABNORMAL HIGH (ref 11–51)

## 2016-11-07 LAB — HEMOGLOBIN A1C
Hgb A1c MFr Bld: 14.3 % — ABNORMAL HIGH (ref 4.8–5.6)
Mean Plasma Glucose: 363.71 mg/dL

## 2016-11-07 LAB — GLUCOSE, CAPILLARY
Glucose-Capillary: 425 mg/dL — ABNORMAL HIGH (ref 65–99)
Glucose-Capillary: 167 mg/dL — ABNORMAL HIGH (ref 65–99)
Glucose-Capillary: 354 mg/dL — ABNORMAL HIGH (ref 65–99)
Glucose-Capillary: 248 mg/dL — ABNORMAL HIGH (ref 65–99)

## 2016-11-07 LAB — CBG MONITORING, ED: Glucose-Capillary: 306 mg/dL — ABNORMAL HIGH (ref 65–99)

## 2016-11-07 MED ORDER — INSULIN ASPART 100 UNIT/ML ~~LOC~~ SOLN
0.0000 [IU] | Freq: Three times a day (TID) | SUBCUTANEOUS | Status: DC
  Administered 2016-11-07: 3 [IU] via SUBCUTANEOUS
  Administered 2016-11-07: 15 [IU] via SUBCUTANEOUS
  Administered 2016-11-08: 11 [IU] via SUBCUTANEOUS

## 2016-11-07 MED ORDER — INSULIN ASPART 100 UNIT/ML ~~LOC~~ SOLN
0.0000 [IU] | Freq: Every day | SUBCUTANEOUS | Status: DC
  Administered 2016-11-07: 2 [IU] via SUBCUTANEOUS

## 2016-11-07 MED ORDER — ONDANSETRON HCL 4 MG PO TABS: 4.0000 mg | ORAL_TABLET | Freq: Four times a day (QID) | ORAL | Status: DC | PRN

## 2016-11-07 MED ORDER — INSULIN ASPART 100 UNIT/ML ~~LOC~~ SOLN
0.0000 [IU] | Freq: Three times a day (TID) | SUBCUTANEOUS | Status: DC
  Administered 2016-11-07: 9 [IU] via SUBCUTANEOUS

## 2016-11-07 MED ORDER — INSULIN ASPART 100 UNIT/ML ~~LOC~~ SOLN
3.0000 [IU] | Freq: Once | SUBCUTANEOUS | Status: AC
  Administered 2016-11-07: 3 [IU] via SUBCUTANEOUS

## 2016-11-07 MED ORDER — SODIUM CHLORIDE 0.9 % IV SOLN
INTRAVENOUS | Status: DC
  Administered 2016-11-07: 05:00:00 via INTRAVENOUS

## 2016-11-07 MED ORDER — PANTOPRAZOLE SODIUM 40 MG IV SOLR
40.0000 mg | Freq: Two times a day (BID) | INTRAVENOUS | Status: DC
  Administered 2016-11-07 (×2): 40 mg via INTRAVENOUS
  Filled 2016-11-07 (×2): qty 40

## 2016-11-07 MED ORDER — ONDANSETRON HCL 4 MG/2ML IJ SOLN
4.0000 mg | Freq: Four times a day (QID) | INTRAMUSCULAR | Status: DC | PRN
  Administered 2016-11-09 – 2016-11-12 (×2): 4 mg via INTRAVENOUS
  Filled 2016-11-07 (×2): qty 2

## 2016-11-07 MED ORDER — SODIUM CHLORIDE 0.9 % IV BOLUS (SEPSIS)
1000.0000 mL | Freq: Once | INTRAVENOUS | Status: AC
  Administered 2016-11-07: 1000 mL via INTRAVENOUS

## 2016-11-07 MED ORDER — ENOXAPARIN SODIUM 40 MG/0.4ML ~~LOC~~ SOLN: 40.0000 mg | SUBCUTANEOUS | Status: DC

## 2016-11-07 MED ORDER — LACTATED RINGERS IV SOLN
INTRAVENOUS | Status: DC
  Administered 2016-11-07 – 2016-11-08 (×6): via INTRAVENOUS

## 2016-11-07 MED ORDER — PNEUMOCOCCAL VAC POLYVALENT 25 MCG/0.5ML IJ INJ
0.5000 mL | INJECTION | INTRAMUSCULAR | Status: AC
Start: 2016-11-08 — End: 2016-11-08
  Administered 2016-11-08: 0.5 mL via INTRAMUSCULAR
  Filled 2016-11-07: qty 0.5

## 2016-11-07 MED ORDER — ACETAMINOPHEN 650 MG RE SUPP: 650.0000 mg | Freq: Four times a day (QID) | RECTAL | Status: DC | PRN

## 2016-11-07 MED ORDER — HYDROMORPHONE HCL 1 MG/ML IJ SOLN
1.0000 mg | INTRAMUSCULAR | Status: DC | PRN
  Administered 2016-11-07 – 2016-11-09 (×12): 1 mg via INTRAVENOUS
  Filled 2016-11-07 (×14): qty 1

## 2016-11-07 MED ORDER — ACETAMINOPHEN 325 MG PO TABS
650.0000 mg | ORAL_TABLET | Freq: Four times a day (QID) | ORAL | Status: DC | PRN
  Administered 2016-11-07 – 2016-11-13 (×4): 650 mg via ORAL
  Filled 2016-11-07 (×4): qty 2

## 2016-11-07 MED ORDER — APIXABAN 5 MG PO TABS
5.0000 mg | ORAL_TABLET | Freq: Two times a day (BID) | ORAL | Status: DC
  Administered 2016-11-07 – 2016-11-13 (×13): 5 mg via ORAL
  Filled 2016-11-07 (×13): qty 1

## 2016-11-07 MED ORDER — HYDROMORPHONE HCL 1 MG/ML IJ SOLN
1.0000 mg | Freq: Once | INTRAMUSCULAR | Status: AC
  Administered 2016-11-07: 1 mg via INTRAVENOUS
  Filled 2016-11-07: qty 1

## 2016-11-07 MED ORDER — ALPRAZOLAM 1 MG PO TABS
1.0000 mg | ORAL_TABLET | Freq: Three times a day (TID) | ORAL | Status: DC | PRN
  Administered 2016-11-07 – 2016-11-13 (×14): 1 mg via ORAL
  Filled 2016-11-07 (×16): qty 1

## 2016-11-07 MED ORDER — INSULIN ASPART 100 UNIT/ML ~~LOC~~ SOLN: 0.0000 [IU] | Freq: Three times a day (TID) | SUBCUTANEOUS | Status: DC

## 2016-11-07 MED ORDER — INSULIN GLARGINE 100 UNIT/ML ~~LOC~~ SOLN
10.0000 [IU] | Freq: Every day | SUBCUTANEOUS | Status: DC
  Administered 2016-11-07: 10 [IU] via SUBCUTANEOUS
  Filled 2016-11-07 (×2): qty 0.1

## 2016-11-07 NOTE — ED Notes (Signed)
Pt given water 
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
## 2016-11-07 NOTE — H&P (Signed)
TRH H&P    Patient Demographics:    Talecia Sherlin, is a 46 y.o. female  MRN: [id number]  DOB - 03-10-1970  Admit Date - 11/06/2016  Referring MD/NP/PA: Ms. Dolly Rias  Outpatient Primary MD for the patient is Sinda Du, MD  Patient coming from: [facility]  Chief Complaint  Patient presents with  . Emesis      HPI:    Wilene Pharo  is a 46 y.o. female, with history of thromboembolism, on anticoagulation with Eliquis, CAD, hyperlipidemia, diabetes mellitus came to hospital with complaints of abdominal pain.  Patient also has been having multiple episodes of vomiting for the past 24 hours.  She also complains of diarrhea. She denies chest pain, no shortness of breath. No fever, Complains of chills. She denies dysuria, urgency or frequency of urination.  In the ED, lab work showed lipase 1,797. Patient does not drink alcohol    Review of systems:      All other systems reviewed and are negative.   With Past History of the following :    Past Medical History:  Diagnosis Date  . Anxiety   . Arterial thromboembolism ([place] )    Prior embolectomy, previously on Coumadin  . Coronary atherosclerosis of native coronary artery    DES to LAD September 2014  . Depression   . Facial cellulitis    12/2010  . Glomerulonephritis   . Headache(784.0)   . History of stroke   . Hyperlipidemia   . Insulin dependent diabetes mellitus ([place])    History of diabetic ketoacidosis  . Ischemic cardiomyopathy    LVEF 40-45%  . Peripheral arterial disease ([place])   . Shingles    11/2010  . ST elevation myocardial infarction (STEMI) of anterior wall [facility])    Late presentation September 2014      Past Surgical History:  Procedure Laterality Date  . AMPUTATION  03/03/2011   Procedure: AMPUTATION DIGIT;  Surgeon: Newt Minion, MD;  Location: [place];  Service: Orthopedics;  Laterality: Left;  Left Foot Amputation 4th  and 5th toes at MTP joint  . AMPUTATION  04/22/2011   Procedure: AMPUTATION BELOW KNEE;  Surgeon: Newt Minion, MD;  Location: [place];  Service: Orthopedics;  Laterality: Left;  Left Below Knee Amputation  . DILATION AND CURETTAGE OF UTERUS    . EMBOLECTOMY  01/29/2011   Procedure: EMBOLECTOMY;  Surgeon: Mal Misty, MD;  Location: [facility] OR;  Service: Vascular;  Laterality: Left;  Left Popliteal and Tibial Embolectomy with patch angioplasty  . INCISION AND DRAINAGE ABSCESS N/A 04/06/2016   Procedure: INCISION AND DRAINAGE ABSCESS;  Surgeon: Jonnie Kind, MD;  Location[facility];  Service: Gynecology;  Laterality: N/A;  . LEFT HEART CATHETERIZATION WITH CORONARY ANGIOGRAM N/A 10/01/2012   Procedure: LEFT HEART CATHETERIZATION WITH CORONARY ANGIOGRAM;  Surgeon: Peter M Martinique, MD;  Location: [facility] CATH LAB;  Service: Cardiovascular;  Laterality: N/A;  . LOWER EXTREMITY ANGIOGRAM N/A 01/27/2011   Procedure: LOWER EXTREMITY ANGIOGRAM;  Surgeon: Rosetta Posner, MD;  Location: [facility] CATH LAB;  Service: Cardiovascular;  Laterality: N/A;  . LOWER EXTREMITY ANGIOGRAM N/A 01/28/2011   Procedure: LOWER EXTREMITY ANGIOGRAM;  Surgeon: Conrad St. Andrews, MD;  Location: [facility] CATH LAB;  Service: Cardiovascular;  Laterality: N/A;  . LOWER EXTREMITY ANGIOGRAM Left 01/29/2011   Procedure: LOWER EXTREMITY ANGIOGRAM;  Surgeon: Mal Misty, MD;  Location: [facility] CATH LAB;  Service: Cardiovascular;  Laterality: Left;  Marland Kitchen MULTIPLE TOOTH EXTRACTIONS    . TEE WITHOUT CARDIOVERSION  04/15/2011   Procedure: TRANSESOPHAGEAL ECHOCARDIOGRAM (TEE);  Surgeon: [name], MD;  [facility];  Service: Cardiovascular;  Laterality: N/A;  . WRIST SURGERY     Left      Social History:      Social History  Substance Use Topics  . Smoking status: Current Every Day Smoker    Packs/day: 0.50    Years: 21.00    Types: Cigarettes    Start date: 07/06/1989    Last attempt to quit: 01/30/2011  . Smokeless tobacco: Never Used  . Alcohol  use No     Comment: rarely       Family History :     Family History  Problem Relation Age of Onset  . COPD Mother        alive - 38  . Hypertension Mother   . Diabetes Mother   . Stroke Mother   . Coronary artery disease Mother   . Diabetes Father        alive - 12  . Hypertension Father   . Coronary artery disease Father   . Anesthesia problems Neg Hx       Home Medications:   Prior to Admission medications   Medication Sig Start Date End Date Taking? Authorizing Provider  ALPRAZolam Duanne Moron) 1 MG tablet Take 1 mg by mouth 3 (three) times daily as needed for anxiety.    [provider]  amoxicillin-clavulanate (AUGMENTIN) 875-125 MG tablet Take 1 tablet by mouth 2 (two) times daily. 04/02/16   Jonnie Kind, MD  doxycycline (VIBRA-TABS) 100 MG tablet Take 1 tablet (100 mg total) by mouth 2 (two) times daily. 04/02/16   Jonnie Kind, MD  insulin aspart (NOVOLOG FLEXPEN) 100 UNIT/ML injection Inject 2-10 Units into the skin 3 (three) times daily before meals. Sliding scale as follows 200-250=2 units 251-300=4 units 301-350=6 units 351-400=8 units >400-give 10 units and Notify MD    [provider]  Insulin Glargine (LANTUS SOLOSTAR) 100 UNIT/ML SOPN Inject 15 Units into the skin at bedtime. Patient taking differently: Inject 20 Units into the skin at bedtime.  10/04/12   Donne Hazel, MD  nitroGLYCERIN (NITROSTAT) 0.4 MG SL tablet Place 1 tablet (0.4 mg total) under the tongue every 5 (five) minutes as needed for chest pain. Patient not taking: Reported on 04/08/2016 10/30/12   Lendon Colonel, NP  oxyCODONE-acetaminophen (PERCOCET) 10-325 MG per tablet Take 1 tablet by mouth 4 (four) times daily.    [provider]  promethazine (PHENERGAN) 25 MG tablet Take 25 mg by mouth every 6 (six) hours as needed for nausea or vomiting.    [provider]     Allergies:     Allergies  Allergen Reactions  . Ibuprofen Other (See  Comments)    Kidney disease  . Losartan Swelling     Physical Exam:   Vitals  Blood pressure 136/73, pulse (!) 103, temperature 98.7 F (37.1 C), temperature source Oral, resp. rate 14, height 5\' 1"  (1.549 m), weight 44.5 kg (  98 lb), last menstrual period 01/12/2011, SpO2 98 %.  1.  General: Appears in no acute distress  2. Psychiatric:  Intact judgement and  insight, awake alert, oriented x 3.  3. Neurologic: No focal neurological deficits, all cranial nerves intact.Strength 5/5 all 4 extremities, sensation intact all 4 extremities, plantars down going.  4. Eyes :  anicteric sclerae, moist conjunctivae with no lid lag. PERRLA.  5. ENMT:  Oropharynx clear with moist mucous membranes and good dentition  6. Neck:  supple, no cervical lymphadenopathy appriciated, No thyromegaly  7. Respiratory : Normal respiratory effort, good air movement bilaterally,clear to  auscultation bilaterally  8. Cardiovascular : RRR, no gallops, rubs or murmurs, no leg edema  9. Gastrointestinal:  Positive bowel sounds, abdomen soft, tenderness to palpation in epigastric region  10. Skin:  No cyanosis, normal texture and turgor, no rash, lesions or ulcers  11.Musculoskeletal:  Good muscle tone,  joints appear normal , no effusions,  normal range of motion    Data Review:    CBC  Recent Labs Lab 11/06/16 2121  WBC 11.5*  HGB 14.1  HCT 40.0  PLT 415*  MCV 81.6  MCH 28.8  MCHC 35.3  RDW 13.0  LYMPHSABS 1.4  MONOABS 0.2  EOSABS 0.0  BASOSABS 0.0   ------------------------------------------------------------------------------------------------------------------  Chemistries   Recent Labs Lab 11/06/16 2121  NA 138  K 3.9  CL 98*  CO2 25  GLUCOSE 380*  BUN 31*  CREATININE 1.46*  CALCIUM 9.4  AST 21  ALT 12*  ALKPHOS 122  BILITOT 1.0    ------------------------------------------------------------------------------------------------------------------  ------------------------------------------------------------------------------------------------------------------ GFR: Estimated Creatinine Clearance: 33.5 mL/min (A) (by C-G formula based on SCr of 1.46 mg/dL (H)). Liver Function Tests:  Recent Labs Lab 11/06/16 2121  AST 21  ALT 12*  ALKPHOS 122  BILITOT 1.0  PROT 7.1  ALBUMIN 3.2*    Recent Labs Lab 11/06/16 2121  LIPASE 1,797*   No results for input(s): AMMONIA in the last 168 hours. Coagulation Profile: No results for input(s): INR, PROTIME in the last 168 hours. Cardiac Enzymes: No results for input(s): CKTOTAL, CKMB, CKMBINDEX, TROPONINI in the last 168 hours. BNP (last 3 results) No results for input(s): PROBNP in the last 8760 hours. HbA1C: No results for input(s): HGBA1C in the last 72 hours. CBG:  Recent Labs Lab 11/06/16 2056 11/06/16 2209 11/07/16 0100  GLUCAP 439* 286* 306*   Lipid Profile: No results for input(s): CHOL, HDL, LDLCALC, TRIG, CHOLHDL, LDLDIRECT in the last 72 hours. Thyroid Function Tests: No results for input(s): TSH, T4TOTAL, FREET4, T3FREE, THYROIDAB in the last 72 hours. Anemia Panel: No results for input(s): VITAMINB12, FOLATE, FERRITIN, TIBC, IRON, RETICCTPCT in the last 72 hours.  --------------------------------------------------------------------------------------------------------------- Urine analysis:    Component Value Date/Time   COLORURINE YELLOW 11/07/2016 0300   APPEARANCEUR CLEAR 11/07/2016 0300   LABSPEC >1.046 (H) 11/07/2016 0300   PHURINE 5.0 11/07/2016 0300   GLUCOSEU >=500 (A) 11/07/2016 0300   HGBUR SMALL (A) 11/07/2016 0300   BILIRUBINUR NEGATIVE 11/07/2016 0300   KETONESUR 5 (A) 11/07/2016 0300   PROTEINUR >=300 (A) 11/07/2016 0300   UROBILINOGEN 0.2 09/30/2012 1959   NITRITE NEGATIVE 11/07/2016 0300   LEUKOCYTESUR NEGATIVE  11/07/2016 0300      Imaging Results:    Ct Abdomen Pelvis W Contrast  Result Date: 11/07/2016 CLINICAL DATA:  Nausea, vomiting, diarrhea, and right upper quadrant pain starting yesterday. EXAM: CT ABDOMEN AND PELVIS WITH CONTRAST TECHNIQUE: Multidetector CT imaging of the abdomen and pelvis was  performed using the standard protocol following bolus administration of intravenous contrast. CONTRAST:  85mL ISOVUE-300 IOPAMIDOL (ISOVUE-300) INJECTION 61%, 134mL ISOVUE-300 IOPAMIDOL (ISOVUE-300) INJECTION 61% COMPARISON:  08/11/2010 FINDINGS: Lower chest: The lung bases are clear. Hepatobiliary: No focal liver abnormality is seen. No gallstones, gallbladder wall thickening, or biliary dilatation. Pancreas: Unremarkable. No pancreatic ductal dilatation or surrounding inflammatory changes. Spleen: Normal in size without focal abnormality. Adrenals/Urinary Tract: No adrenal gland nodules. Cyst in the upper pole right kidney. No solid renal mass lesions. Nephrograms are symmetrical. No hydronephrosis or hydroureter. Bladder wall is not thickened and no bladder filling defects are identified. Stomach/Bowel: Stomach and small bowel are mostly decompressed. Contrast material is demonstrated in the distal small bowel suggesting no evidence of obstruction. Scattered stool in the colon. Colon is mostly decompressed as well. No inflammatory bowel changes are demonstrated. The appendix is normal. Vascular/Lymphatic: Diffuse calcification of the abdominal aorta without aneurysm. Possible calcific stenosis of the iliac arteries bilaterally. The iliac arteries and external iliac arteries remain patent. Reproductive: Uterus and bilateral adnexa are unremarkable. Other: No free air or free fluid in the abdomen. No loculated collections. Abdominal wall musculature appears intact. Musculoskeletal: No acute or significant osseous findings. IMPRESSION: No acute process demonstrated in the abdomen or pelvis. No evidence of bowel  obstruction or inflammation. Diffuse aortic atherosclerosis with possible calcific stenosis in the iliac arteries. Electronically Signed   By: Lucienne Capers M.D.   On: 11/07/2016 00:08    My personal review of EKG: Rhythm NSR,   Assessment & Plan:    Active Problems:   Pancreatitis   1. Pancreatitis-CT scan shows normal pancreas, unclear etiology at this time.  We will keep her n.p.o., start IV normal saline at 125 mL per hour .  Check lipids in a.m.  We will start Dilaudid 1 mg IV every 4 hours as needed for pain. 2. Diabetes mellitus-we will resume Lantus at lower dose of 10 units subcu daily, sliding scale insulin with NovoLog.  Check CBC every 6 hours. 3. History of thromboembolism-patient has history of arterial thromboembolism, currently on Eliquis.  Will continue with Eliquis    DVT Prophylaxis-   Lovenox   AM Labs Ordered, also please review Full Orders  Family Communication: Admission, patients condition and plan of care including tests being ordered have been discussed with the patient and her daughter at bedside who indicate understanding and agree with the plan and Code Status.  Code Status: Full code  Admission status: Inpatient  Time spent in minutes : 60 minutes   Mende Biswell S M.D on 11/07/2016 at 3:33 AM  Between 7am to 7pm - Pager - [phone]. After 7pm go to [email or web address] - password [email or web address]  [facility] - Office  [phone]

## 2016-11-07 NOTE — Progress Notes (Signed)
Patient admitted with pancreatitis she denies ethanol consumption lives with daughterPatient admitted with pancreatitis she denies ethanol consumption lives with daughter GI consult requested we will add Protonix 40 mg IV every 12 hours she is n.p.o. except for some ice chips we will add Protonix 40 mg IV every 12 hours she is nothing by mouth except for some ice chips patient is also anticoagulated will perform CBC in a.m. along with repeat lipase daily 2 will perform CBC in a.m. with repeat lipase daily x2 patient is also anticoagulated GI consult requested [name] BPZ:[id number] DOB: [birth year] DOA: 11/06/2016 PCP: Sinda Du, MD   Physical Exam: Blood pressure (!) 132/58, pulse (!) 104, temperature 98.7 F (37.1 C), temperature source Oral, resp. rate 14, height 5\' 1"  (1.549 m), weight 44.5 kg (98 lb), last menstrual period 01/12/2011, SpO2 98 %. Lungs show prolonged history phase no rales wheeze rhonchi appreciable heart irregular regular no S3 or S4 no heaves thrills rubs abdomen no significant epigastric tenderness surprisingly active bowel sounds normoactive no rebound tenderness no rebound tenderness S3 or S4 no heaves chills or abdomen no significant epigastric tenderness surprisingly lungs show prolonged expiratory phase no rales wheezes appreciable heart irregular regular no   Investigations:  No results found for this or any previous visit (from the past 240 hour(s)).   Basic Metabolic Panel:  Recent Labs  11/06/16 2121 11/07/16 0634  NA 138 133*  K 3.9 3.7  CL 98* 100*  CO2 25 20*  GLUCOSE 380* 391*  BUN 31* 24*  CREATININE 1.46* 0.99  CALCIUM 9.4 8.0*   Liver Function Tests:  Recent Labs  11/06/16 2121 11/07/16 0634  AST 21 13*  ALT 12* 10*  ALKPHOS 122 92  BILITOT 1.0 0.9  PROT 7.1 5.4*  ALBUMIN 3.2* 2.4*     CBC:  Recent Labs  11/06/16 2121  WBC 11.5*  NEUTROABS 10.0*  HGB 14.1  HCT 40.0  MCV 81.6  PLT 415*    Ct Abdomen Pelvis W  Contrast  Result Date: 11/07/2016 CLINICAL DATA:  Nausea, vomiting, diarrhea, and right upper quadrant pain starting yesterday. EXAM: CT ABDOMEN AND PELVIS WITH CONTRAST TECHNIQUE: Multidetector CT imaging of the abdomen and pelvis was performed using the standard protocol following bolus administration of intravenous contrast. CONTRAST:  35mL ISOVUE-300 IOPAMIDOL (ISOVUE-300) INJECTION 61%, 170mL ISOVUE-300 IOPAMIDOL (ISOVUE-300) INJECTION 61% COMPARISON:  08/11/2010 FINDINGS: Lower chest: The lung bases are clear. Hepatobiliary: No focal liver abnormality is seen. No gallstones, gallbladder wall thickening, or biliary dilatation. Pancreas: Unremarkable. No pancreatic ductal dilatation or surrounding inflammatory changes. Spleen: Normal in size without focal abnormality. Adrenals/Urinary Tract: No adrenal gland nodules. Cyst in the upper pole right kidney. No solid renal mass lesions. Nephrograms are symmetrical. No hydronephrosis or hydroureter. Bladder wall is not thickened and no bladder filling defects are identified. Stomach/Bowel: Stomach and small bowel are mostly decompressed. Contrast material is demonstrated in the distal small bowel suggesting no evidence of obstruction. Scattered stool in the colon. Colon is mostly decompressed as well. No inflammatory bowel changes are demonstrated. The appendix is normal. Vascular/Lymphatic: Diffuse calcification of the abdominal aorta without aneurysm. Possible calcific stenosis of the iliac arteries bilaterally. The iliac arteries and external iliac arteries remain patent. Reproductive: Uterus and bilateral adnexa are unremarkable. Other: No free air or free fluid in the abdomen. No loculated collections. Abdominal wall musculature appears intact. Musculoskeletal: No acute or significant osseous findings. IMPRESSION: No acute process demonstrated in the abdomen or pelvis. No evidence of bowel  obstruction or inflammation. Diffuse aortic atherosclerosis with  possible calcific stenosis in the iliac arteries. Electronically Signed   By: Lucienne Capers M.D.   On: 11/07/2016 00:08      Medications:     impression:  Active Problems:   Pancreatitis     Plan: Add Protonix 40 mg IV every 12 hours serial lipases daily repeat CBC in a.m. due to anticoagulation status  Consultants: GastroenterologyGastroenterology   Procedures   Antibiotics:      30 minutes       Time spent: 30 minutes   LOS: 0 days   Oddie Bottger M   11/07/2016, 12:07 PM

## 2016-11-08 DIAGNOSIS — G8929 Other chronic pain: Secondary | ICD-10-CM | POA: Diagnosis present

## 2016-11-08 DIAGNOSIS — Z8673 Personal history of transient ischemic attack (TIA), and cerebral infarction without residual deficits: Secondary | ICD-10-CM | POA: Diagnosis not present

## 2016-11-08 DIAGNOSIS — F329 Major depressive disorder, single episode, unspecified: Secondary | ICD-10-CM | POA: Diagnosis present

## 2016-11-08 DIAGNOSIS — E1051 Type 1 diabetes mellitus with diabetic peripheral angiopathy without gangrene: Secondary | ICD-10-CM | POA: Diagnosis present

## 2016-11-08 DIAGNOSIS — K5903 Drug induced constipation: Secondary | ICD-10-CM | POA: Diagnosis present

## 2016-11-08 DIAGNOSIS — K8500 Idiopathic acute pancreatitis without necrosis or infection: Secondary | ICD-10-CM | POA: Diagnosis present

## 2016-11-08 DIAGNOSIS — Z886 Allergy status to analgesic agent status: Secondary | ICD-10-CM | POA: Diagnosis not present

## 2016-11-08 DIAGNOSIS — E785 Hyperlipidemia, unspecified: Secondary | ICD-10-CM | POA: Diagnosis present

## 2016-11-08 DIAGNOSIS — G546 Phantom limb syndrome with pain: Secondary | ICD-10-CM | POA: Diagnosis present

## 2016-11-08 DIAGNOSIS — F419 Anxiety disorder, unspecified: Secondary | ICD-10-CM | POA: Diagnosis present

## 2016-11-08 DIAGNOSIS — Z7901 Long term (current) use of anticoagulants: Secondary | ICD-10-CM | POA: Diagnosis not present

## 2016-11-08 DIAGNOSIS — Z823 Family history of stroke: Secondary | ICD-10-CM | POA: Diagnosis not present

## 2016-11-08 DIAGNOSIS — Z8249 Family history of ischemic heart disease and other diseases of the circulatory system: Secondary | ICD-10-CM | POA: Diagnosis not present

## 2016-11-08 DIAGNOSIS — I2510 Atherosclerotic heart disease of native coronary artery without angina pectoris: Secondary | ICD-10-CM | POA: Diagnosis present

## 2016-11-08 DIAGNOSIS — Z833 Family history of diabetes mellitus: Secondary | ICD-10-CM | POA: Diagnosis not present

## 2016-11-08 DIAGNOSIS — Z89512 Acquired absence of left leg below knee: Secondary | ICD-10-CM | POA: Diagnosis not present

## 2016-11-08 DIAGNOSIS — Z86718 Personal history of other venous thrombosis and embolism: Secondary | ICD-10-CM | POA: Diagnosis not present

## 2016-11-08 DIAGNOSIS — R109 Unspecified abdominal pain: Secondary | ICD-10-CM | POA: Diagnosis present

## 2016-11-08 DIAGNOSIS — T50995A Adverse effect of other drugs, medicaments and biological substances, initial encounter: Secondary | ICD-10-CM | POA: Diagnosis present

## 2016-11-08 DIAGNOSIS — Z23 Encounter for immunization: Secondary | ICD-10-CM | POA: Diagnosis not present

## 2016-11-08 DIAGNOSIS — G9341 Metabolic encephalopathy: Secondary | ICD-10-CM | POA: Diagnosis present

## 2016-11-08 DIAGNOSIS — F17210 Nicotine dependence, cigarettes, uncomplicated: Secondary | ICD-10-CM | POA: Diagnosis present

## 2016-11-08 DIAGNOSIS — I252 Old myocardial infarction: Secondary | ICD-10-CM | POA: Diagnosis not present

## 2016-11-08 DIAGNOSIS — Z888 Allergy status to other drugs, medicaments and biological substances status: Secondary | ICD-10-CM | POA: Diagnosis not present

## 2016-11-08 DIAGNOSIS — I255 Ischemic cardiomyopathy: Secondary | ICD-10-CM | POA: Diagnosis present

## 2016-11-08 LAB — GLUCOSE, CAPILLARY
Glucose-Capillary: 346 mg/dL — ABNORMAL HIGH (ref 65–99)
Glucose-Capillary: 98 mg/dL (ref 65–99)
Glucose-Capillary: 248 mg/dL — ABNORMAL HIGH (ref 65–99)
Glucose-Capillary: 197 mg/dL — ABNORMAL HIGH (ref 65–99)

## 2016-11-08 LAB — CBC WITH DIFFERENTIAL/PLATELET
Basophils Absolute: 0 10*3/uL (ref 0.0–0.1)
Basophils Relative: 0 %
Eosinophils Absolute: 0.2 10*3/uL (ref 0.0–0.7)
Eosinophils Relative: 3 %
HCT: 30.9 % — ABNORMAL LOW (ref 36.0–46.0)
Hemoglobin: 10.5 g/dL — ABNORMAL LOW (ref 12.0–15.0)
Lymphocytes Relative: 35 %
Lymphs Abs: 3 10*3/uL (ref 0.7–4.0)
MCH: 28.7 pg (ref 26.0–34.0)
MCHC: 34 g/dL (ref 30.0–36.0)
MCV: 84.4 fL (ref 78.0–100.0)
Monocytes Absolute: 0.6 10*3/uL (ref 0.1–1.0)
Monocytes Relative: 7 %
Neutro Abs: 4.6 10*3/uL (ref 1.7–7.7)
Neutrophils Relative %: 55 %
Platelets: 263 10*3/uL (ref 150–400)
RBC: 3.66 MIL/uL — ABNORMAL LOW (ref 3.87–5.11)
RDW: 13.5 % (ref 11.5–15.5)
WBC: 8.4 10*3/uL (ref 4.0–10.5)

## 2016-11-08 LAB — COMPREHENSIVE METABOLIC PANEL
ALT: 8 U/L — ABNORMAL LOW (ref 14–54)
AST: 12 U/L — ABNORMAL LOW (ref 15–41)
Albumin: 2 g/dL — ABNORMAL LOW (ref 3.5–5.0)
Alkaline Phosphatase: 84 U/L (ref 38–126)
Anion gap: 7 (ref 5–15)
BUN: 14 mg/dL (ref 6–20)
CO2: 23 mmol/L (ref 22–32)
Calcium: 8.1 mg/dL — ABNORMAL LOW (ref 8.9–10.3)
Chloride: 103 mmol/L (ref 101–111)
Creatinine, Ser: 0.72 mg/dL (ref 0.44–1.00)
GFR calc Af Amer: >60 mL/min (ref >60)
GFR calc non Af Amer: >60 mL/min (ref >60)
Glucose, Bld: 375 mg/dL — ABNORMAL HIGH (ref 65–99)
Potassium: 4 mmol/L (ref 3.5–5.1)
Sodium: 133 mmol/L — ABNORMAL LOW (ref 135–145)
Total Bilirubin: 0.3 mg/dL (ref 0.3–1.2)
Total Protein: 4.6 g/dL — ABNORMAL LOW (ref 6.5–8.1)

## 2016-11-08 LAB — HIV ANTIBODY (ROUTINE TESTING W REFLEX): HIV Screen 4th Generation wRfx: NONREACTIVE

## 2016-11-08 LAB — LIPASE, BLOOD: Lipase: 120 U/L — ABNORMAL HIGH (ref 11–51)

## 2016-11-08 MED ORDER — PANTOPRAZOLE SODIUM 40 MG PO TBEC
40.0000 mg | DELAYED_RELEASE_TABLET | Freq: Two times a day (BID) | ORAL | Status: DC
  Administered 2016-11-08 – 2016-11-13 (×10): 40 mg via ORAL
  Filled 2016-11-08 (×11): qty 1

## 2016-11-08 MED ORDER — INSULIN ASPART 100 UNIT/ML ~~LOC~~ SOLN
0.0000 [IU] | Freq: Every day | SUBCUTANEOUS | Status: DC
  Administered 2016-11-12: 3 [IU] via SUBCUTANEOUS

## 2016-11-08 MED ORDER — INSULIN ASPART 100 UNIT/ML ~~LOC~~ SOLN
4.0000 [IU] | Freq: Three times a day (TID) | SUBCUTANEOUS | Status: DC
  Administered 2016-11-08 – 2016-11-11 (×5): 4 [IU] via SUBCUTANEOUS

## 2016-11-08 MED ORDER — INSULIN ASPART 100 UNIT/ML ~~LOC~~ SOLN
0.0000 [IU] | Freq: Three times a day (TID) | SUBCUTANEOUS | Status: DC
  Administered 2016-11-08 – 2016-11-10 (×3): 3 [IU] via SUBCUTANEOUS
  Administered 2016-11-10: 5 [IU] via SUBCUTANEOUS
  Administered 2016-11-11: 2 [IU] via SUBCUTANEOUS
  Administered 2016-11-11: 3 [IU] via SUBCUTANEOUS
  Administered 2016-11-12: 1 [IU] via SUBCUTANEOUS

## 2016-11-08 MED ORDER — INSULIN GLARGINE 100 UNIT/ML ~~LOC~~ SOLN
20.0000 [IU] | Freq: Every day | SUBCUTANEOUS | Status: DC
  Administered 2016-11-08 – 2016-11-12 (×4): 20 [IU] via SUBCUTANEOUS
  Filled 2016-11-08 (×7): qty 0.2

## 2016-11-08 MED ORDER — LACTATED RINGERS IV SOLN
INTRAVENOUS | Status: DC
  Administered 2016-11-08 – 2016-11-12 (×5): via INTRAVENOUS

## 2016-11-08 NOTE — Progress Notes (Addendum)
Subjective: She says she's hungry. She has full liquids ordered but she doesn't like either grits or oatmeal. No other new changes.  Objective: Vital signs in last 24 hours: Temp:  [98.4 F (36.9 C)-99.3 F (37.4 C)] 99.3 F (37.4 C) (10/22 0545) Pulse Rate:  [90-91] 90 (10/22 0545) Resp:  [16] 16 (10/22 0545) BP: (120-141)/(49-59) 141/59 (10/22 0545) SpO2:  [99 %-100 %] 99 % (10/22 0545) Weight change:  Last BM Date: 11/05/16  Intake/Output from previous day: 10/21 0701 - 10/22 0700 In: 3700 [I.V.:3700] Out: -   PHYSICAL EXAM General appearance: alert, cooperative and no distress Resp: clear to auscultation bilaterally Cardio: regular rate and rhythm, S1, S2 normal, no murmur, click, rub or gallop GI: She has some mild left upper quadrant tenderness which apparently is better Extremities: She has previous amputation which is unchanged Very anxious  Lab Results:  Results for orders placed or performed during the hospital encounter of 11/06/16 (from the past 48 hour(s))  CBG monitoring, ED     Status: Abnormal   Collection Time: 11/06/16  8:56 PM  Result Value Ref Range   Glucose-Capillary 439 (H) 65 - 99 mg/dL  CBC with Differential     Status: Abnormal   Collection Time: 11/06/16  9:21 PM  Result Value Ref Range   WBC 11.5 (H) 4.0 - 10.5 K/uL   RBC 4.90 3.87 - 5.11 MIL/uL   Hemoglobin 14.1 12.0 - 15.0 g/dL   HCT 40.0 36.0 - 46.0 %   MCV 81.6 78.0 - 100.0 fL   MCH 28.8 26.0 - 34.0 pg   MCHC 35.3 30.0 - 36.0 g/dL   RDW 13.0 11.5 - 15.5 %   Platelets 415 (H) 150 - 400 K/uL   Neutrophils Relative % 86 %   Neutro Abs 10.0 (H) 1.7 - 7.7 K/uL   Lymphocytes Relative 12 %   Lymphs Abs 1.4 0.7 - 4.0 K/uL   Monocytes Relative 2 %   Monocytes Absolute 0.2 0.1 - 1.0 K/uL   Eosinophils Relative 0 %   Eosinophils Absolute 0.0 0.0 - 0.7 K/uL   Basophils Relative 0 %   Basophils Absolute 0.0 0.0 - 0.1 K/uL  Comprehensive metabolic panel     Status: Abnormal   Collection  Time: 11/06/16  9:21 PM  Result Value Ref Range   Sodium 138 135 - 145 mmol/L   Potassium 3.9 3.5 - 5.1 mmol/L   Chloride 98 (L) 101 - 111 mmol/L   CO2 25 22 - 32 mmol/L   Glucose, Bld 380 (H) 65 - 99 mg/dL   BUN 31 (H) 6 - 20 mg/dL   Creatinine, Ser 1.46 (H) 0.44 - 1.00 mg/dL   Calcium 9.4 8.9 - 10.3 mg/dL   Total Protein 7.1 6.5 - 8.1 g/dL   Albumin 3.2 (L) 3.5 - 5.0 g/dL   AST 21 15 - 41 U/L   ALT 12 (L) 14 - 54 U/L   Alkaline Phosphatase 122 38 - 126 U/L   Total Bilirubin 1.0 0.3 - 1.2 mg/dL   GFR calc non Af Amer 42 (L) >60 mL/min   GFR calc Af Amer 48 (L) >60 mL/min    Comment: (NOTE) The eGFR has been calculated using the CKD EPI equation. This calculation has not been validated in all clinical situations. eGFR's persistently <60 mL/min signify possible Chronic Kidney Disease.    Anion gap 15 5 - 15  Lipase, blood     Status: Abnormal   Collection Time: 11/06/16  9:21 PM  Result Value Ref Range   Lipase 1,797 (H) 11 - 51 U/L    Comment: RESULTS CONFIRMED BY MANUAL DILUTION  Blood gas, venous     Status: None   Collection Time: 11/06/16  9:22 PM  Result Value Ref Range   pH, Ven 7.356 7.250 - 7.430   pCO2, Ven 44.7 44.0 - 60.0 mmHg   pO2, Ven  32.0 - 45.0 mmHg    RBV BELOW REP RANGE SARAH LASHLEY RN BY BFRETWELL RRT 11/06/16 2200   Bicarbonate 22.6 20.0 - 28.0 mmol/L   Acid-Base Excess 0.4 0.0 - 2.0 mmol/L   O2 Saturation 49.2 %   Drawn by COLLECTED BY LABORATORY    Sample type VEIN   CBG monitoring, ED     Status: Abnormal   Collection Time: 11/06/16 10:09 PM  Result Value Ref Range   Glucose-Capillary 286 (H) 65 - 99 mg/dL  CBG monitoring, ED     Status: Abnormal   Collection Time: 11/07/16  1:00 AM  Result Value Ref Range   Glucose-Capillary 306 (H) 65 - 99 mg/dL  Urinalysis, Routine w reflex microscopic     Status: Abnormal   Collection Time: 11/07/16  3:00 AM  Result Value Ref Range   Color, Urine YELLOW YELLOW   APPearance CLEAR CLEAR   Specific  Gravity, Urine >1.046 (H) 1.005 - 1.030   pH 5.0 5.0 - 8.0   Glucose, UA >=500 (A) NEGATIVE mg/dL   Hgb urine dipstick SMALL (A) NEGATIVE   Bilirubin Urine NEGATIVE NEGATIVE   Ketones, ur 5 (A) NEGATIVE mg/dL   Protein, ur >=300 (A) NEGATIVE mg/dL   Nitrite NEGATIVE NEGATIVE   Leukocytes, UA NEGATIVE NEGATIVE   RBC / HPF 0-5 0 - 5 RBC/hpf   WBC, UA 0-5 0 - 5 WBC/hpf   Bacteria, UA RARE (A) NONE SEEN   Squamous Epithelial / LPF 0-5 (A) NONE SEEN  Hemoglobin A1c     Status: Abnormal   Collection Time: 11/07/16  6:34 AM  Result Value Ref Range   Hgb A1c MFr Bld 14.3 (H) 4.8 - 5.6 %    Comment: (NOTE) Pre diabetes:          5.7%-6.4% Diabetes:              >6.4% Glycemic control for   <7.0% adults with diabetes    Mean Plasma Glucose 363.71 mg/dL    Comment: Performed at [facility], [address] [place], [place] [address]  Comprehensive metabolic panel     Status: Abnormal   Collection Time: 11/07/16  6:34 AM  Result Value Ref Range   Sodium 133 (L) 135 - 145 mmol/L   Potassium 3.7 3.5 - 5.1 mmol/L   Chloride 100 (L) 101 - 111 mmol/L   CO2 20 (L) 22 - 32 mmol/L   Glucose, Bld 391 (H) 65 - 99 mg/dL   BUN 24 (H) 6 - 20 mg/dL   Creatinine, Ser 0.99 0.44 - 1.00 mg/dL   Calcium 8.0 (L) 8.9 - 10.3 mg/dL   Total Protein 5.4 (L) 6.5 - 8.1 g/dL   Albumin 2.4 (L) 3.5 - 5.0 g/dL   AST 13 (L) 15 - 41 U/L   ALT 10 (L) 14 - 54 U/L   Alkaline Phosphatase 92 38 - 126 U/L   Total Bilirubin 0.9 0.3 - 1.2 mg/dL   GFR calc non Af Amer >60 >60 mL/min   GFR calc Af Amer >60 >60 mL/min  Comment: (NOTE) The eGFR has been calculated using the CKD EPI equation. This calculation has not been validated in all clinical situations. eGFR's persistently <60 mL/min signify possible Chronic Kidney Disease.    Anion gap 13 5 - 15  Lipase, blood     Status: Abnormal   Collection Time: 11/07/16  6:34 AM  Result Value Ref Range   Lipase 700 (H) 11 - 51 U/L    Comment: RESULTS CONFIRMED BY  MANUAL DILUTION  Lipid panel     Status: Abnormal   Collection Time: 11/07/16  7:22 AM  Result Value Ref Range   Cholesterol 162 0 - 200 mg/dL   Triglycerides 152 (H) <150 mg/dL   HDL 39 (L) >40 mg/dL   Total CHOL/HDL Ratio 4.2 RATIO   VLDL 30 0 - 40 mg/dL   LDL Cholesterol 93 0 - 99 mg/dL    Comment:        Total Cholesterol/HDL:CHD Risk Coronary Heart Disease Risk Table                     Men   Women  1/2 Average Risk   3.4   3.3  Average Risk       5.0   4.4  2 X Average Risk   9.6   7.1  3 X Average Risk  23.4   11.0        Use the calculated Patient Ratio above and the CHD Risk Table to determine the patient's CHD Risk.        ATP III CLASSIFICATION (LDL):  <100     mg/dL   Optimal  100-129  mg/dL   Near or Above                    Optimal  130-159  mg/dL   Borderline  160-189  mg/dL   High  >190     mg/dL   Very High   Glucose, capillary     Status: Abnormal   Collection Time: 11/07/16  7:26 AM  Result Value Ref Range   Glucose-Capillary 425 (H) 65 - 99 mg/dL  Glucose, capillary     Status: Abnormal   Collection Time: 11/07/16 11:40 AM  Result Value Ref Range   Glucose-Capillary 167 (H) 65 - 99 mg/dL  Glucose, capillary     Status: Abnormal   Collection Time: 11/07/16  4:48 PM  Result Value Ref Range   Glucose-Capillary 354 (H) 65 - 99 mg/dL  Glucose, capillary     Status: Abnormal   Collection Time: 11/07/16  9:49 PM  Result Value Ref Range   Glucose-Capillary 248 (H) 65 - 99 mg/dL  Lipase, blood     Status: Abnormal   Collection Time: 11/08/16  4:10 AM  Result Value Ref Range   Lipase 120 (H) 11 - 51 U/L  CBC with Differential/Platelet     Status: Abnormal   Collection Time: 11/08/16  4:10 AM  Result Value Ref Range   WBC 8.4 4.0 - 10.5 K/uL   RBC 3.66 (L) 3.87 - 5.11 MIL/uL   Hemoglobin 10.5 (L) 12.0 - 15.0 g/dL    Comment: DELTA CHECK NOTED POSSIBLE DEHYDRATION ON PREVIOUS DRAW PER RN.    HCT 30.9 (L) 36.0 - 46.0 %   MCV 84.4 78.0 - 100.0 fL    MCH 28.7 26.0 - 34.0 pg   MCHC 34.0 30.0 - 36.0 g/dL   RDW 13.5 11.5 - 15.5 %   Platelets 263  150 - 400 K/uL   Neutrophils Relative % 55 %   Neutro Abs 4.6 1.7 - 7.7 K/uL   Lymphocytes Relative 35 %   Lymphs Abs 3.0 0.7 - 4.0 K/uL   Monocytes Relative 7 %   Monocytes Absolute 0.6 0.1 - 1.0 K/uL   Eosinophils Relative 3 %   Eosinophils Absolute 0.2 0.0 - 0.7 K/uL   Basophils Relative 0 %   Basophils Absolute 0.0 0.0 - 0.1 K/uL  Glucose, capillary     Status: Abnormal   Collection Time: 11/08/16  7:22 AM  Result Value Ref Range   Glucose-Capillary 346 (H) 65 - 99 mg/dL   Comment 1 Notify RN    Comment 2 Document in Chart     ABGS  Recent Labs  11/06/16 2122  HCO3 22.6   CULTURES No results found for this or any previous visit (from the past 240 hour(s)). Studies/Results: Ct Abdomen Pelvis W Contrast  Result Date: 11/07/2016 CLINICAL DATA:  Nausea, vomiting, diarrhea, and right upper quadrant pain starting yesterday. EXAM: CT ABDOMEN AND PELVIS WITH CONTRAST TECHNIQUE: Multidetector CT imaging of the abdomen and pelvis was performed using the standard protocol following bolus administration of intravenous contrast. CONTRAST:  60m ISOVUE-300 IOPAMIDOL (ISOVUE-300) INJECTION 61%, 1055mISOVUE-300 IOPAMIDOL (ISOVUE-300) INJECTION 61% COMPARISON:  08/11/2010 FINDINGS: Lower chest: The lung bases are clear. Hepatobiliary: No focal liver abnormality is seen. No gallstones, gallbladder wall thickening, or biliary dilatation. Pancreas: Unremarkable. No pancreatic ductal dilatation or surrounding inflammatory changes. Spleen: Normal in size without focal abnormality. Adrenals/Urinary Tract: No adrenal gland nodules. Cyst in the upper pole right kidney. No solid renal mass lesions. Nephrograms are symmetrical. No hydronephrosis or hydroureter. Bladder wall is not thickened and no bladder filling defects are identified. Stomach/Bowel: Stomach and small bowel are mostly decompressed. Contrast  material is demonstrated in the distal small bowel suggesting no evidence of obstruction. Scattered stool in the colon. Colon is mostly decompressed as well. No inflammatory bowel changes are demonstrated. The appendix is normal. Vascular/Lymphatic: Diffuse calcification of the abdominal aorta without aneurysm. Possible calcific stenosis of the iliac arteries bilaterally. The iliac arteries and external iliac arteries remain patent. Reproductive: Uterus and bilateral adnexa are unremarkable. Other: No free air or free fluid in the abdomen. No loculated collections. Abdominal wall musculature appears intact. Musculoskeletal: No acute or significant osseous findings. IMPRESSION: No acute process demonstrated in the abdomen or pelvis. No evidence of bowel obstruction or inflammation. Diffuse aortic atherosclerosis with possible calcific stenosis in the iliac arteries. Electronically Signed   By: WiLucienne Capers.D.   On: 11/07/2016 00:08   UsKoreabdomen Limited  Result Date: 11/07/2016 CLINICAL DATA:  Evaluate for gallstones.  Acute pancreatitis. EXAM: ULTRASOUND ABDOMEN LIMITED RIGHT UPPER QUADRANT COMPARISON:  CT scan from earlier today FINDINGS: Gallbladder: No gallstones or wall thickening visualized. No sonographic Murphy sign noted by sonographer. Common bile duct: Diameter: 4.2 mm Liver: No focal lesion identified. Within normal limits in parenchymal echogenicity. Portal vein is patent on color Doppler imaging with normal direction of blood flow towards the liver. IMPRESSION: Normal study.  No cholelithiasis. Electronically Signed   By: DaDorise BullionII M.D   On: 11/07/2016 12:43    Medications:  Prior to Admission:  Prescriptions Prior to Admission  Medication Sig Dispense Refill Last Dose  . ALPRAZolam (XANAX) 1 MG tablet Take 1 mg by mouth 3 (three) times daily as needed for anxiety.   Past Week at Unknown time  . apixaban (ELIQUIS) 5 MG  TABS tablet Take 5 mg by mouth 2 (two) times daily.    11/07/2016 at 0900  . insulin aspart (NOVOLOG FLEXPEN) 100 UNIT/ML injection Inject 2-10 Units into the skin 3 (three) times daily before meals. Sliding scale as follows 200-250=2 units 251-300=4 units 301-350=6 units 351-400=8 units >400-give 10 units and Notify MD   11/06/2016 at Unknown time  . Insulin Glargine (LANTUS SOLOSTAR) 100 UNIT/ML SOPN Inject 15 Units into the skin at bedtime. (Patient taking differently: Inject 20 Units into the skin at bedtime. )   11/06/2016 at Unknown time  . nitroGLYCERIN (NITROSTAT) 0.4 MG SL tablet Place 1 tablet (0.4 mg total) under the tongue every 5 (five) minutes as needed for chest pain. 25 tablet 5 unknown  . oxyCODONE-acetaminophen (PERCOCET) 10-325 MG per tablet Take 1 tablet by mouth 4 (four) times daily.   11/06/2016 at Unknown time  . promethazine (PHENERGAN) 25 MG tablet Take 25 mg by mouth every 6 (six) hours as needed for nausea or vomiting.   11/06/2016 at Unknown time   Scheduled: . apixaban  5 mg Oral BID  . insulin aspart  0-15 Units Subcutaneous TID WC  . insulin aspart  0-5 Units Subcutaneous QHS  . insulin glargine  10 Units Subcutaneous QHS  . pantoprazole (PROTONIX) IV  40 mg Intravenous Q12H  . pneumococcal 23 valent vaccine  0.5 mL Intramuscular Tomorrow-1000   Continuous: . lactated ringers 125 mL/hr at 11/08/16 1848   TTC:[id number] **OR** acetaminophen, ALPRAZolam, HYDROmorphone (DILAUDID) injection, ondansetron **OR** ondansetron (ZOFRAN) IV  Assesment: She was admitted with pancreatitis. She has diabetes at baseline. 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 She seems to be improving.Blood sugar okay on current treatment Active Problems:   Pancreatitis    Plan: See if she can tolerate full liquids try to add toast so that she will have something that she would like to eat. Continue other treatments.Continue sliding scale.    LOS: 0 days   Aarya Quebedeaux L 11/08/2016, 8:45 AM

## 2016-11-08 NOTE — Care Management Note (Signed)
Case Management Note  Patient Details  Name: [name] MRN: [id number] Date of Birth: [birth year]  Subjective/Objective:              Admitted with pancreatitis. Pt is from home, lives with her dtr who is her aid 7.5hr per day, 7 days a week. Pt has amputation, has a prothesis but has lost a significant amount of weight and is unable to use it. She uses wheelchair for mobility. She was not active with [facility] services pta, does not feel she needs them at this time. Pt reports compliance with medications and answers yes to being complaint with diet but looks the other way as she answers. She reports it is normal for her sugars to run 275 up to 400's. She has been to endocrinologist in the past but is not currently seeing one. Pt tells me her A1c was 12 the last time it was checked. I tell her it is now 81 and she becomes very tearful.       Action/Plan: Pt referred to dietician, diabetes coordinator and chaplain. No CM needs anticipated at DC. Will cont to follow to DC.  Expected Discharge Date:     11/11/2016             Expected Discharge Plan:  Home/Self Care  In-House Referral:  Nutrition  Discharge planning Services  CM Consult  Post Acute Care Choice:  NA Choice offered to:  NA  Status of Service:  In process, will continue to follow  Sherald Barge, RN 11/08/2016, 12:52 PM

## 2016-11-08 NOTE — Progress Notes (Signed)
Nutrition Brief Note  Patient identified on the Malnutrition Screening Tool (MST) Report and RD consulted to provide DM education.   The patient is a 46 yo female with hx of depression, anxiety and DM type 1 ( pt says she was diagnosed at 46 yo)- poorly controlled. Her daughter helps with her care giving. The patient says she counts cho but is unable to demonstrate. She says she eats 6-8 chos per meal.   RD provided "Carbohydrate Counting for People with Diabetes" handout from the Academy of [employer] and Dietetics. Discussed different food groups and their effects on blood sugar, emphasizing carbohydrate-containing foods. Provided list of carbohydrates and recommended serving sizes of common foods.  Discussed importance of controlled and consistent carbohydrate intake throughout the day. Provided examples of ways to balance meals/snacks and encouraged intake of high-fiber, whole grain complex carbohydrates. Teach back method used.  Expect fair compliance. The daughter needs to be educated as well since she is assisting with meal preparations and diabetes management. RD will be happy to return and review with her.   Past Medical History:  Diagnosis Date  . Anxiety   . 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 Coronary atherosclerosis of native coronary artery    DES to LAD September 2014  . Depression   . Facial cellulitis    12/2010  . Glomerulonephritis   . Headache(784.0)   . History of stroke   . Hyperlipidemia   . Insulin dependent diabetes mellitus ([place])    History of diabetic ketoacidosis  . Ischemic cardiomyopathy    LVEF 40-45%  . Peripheral arterial disease ([place])   . Shingles    11/2010  . ST elevation myocardial infarction (STEMI) of anterior wall [facility])    Late presentation September 2014    Scheduled meds . apixaban  5 mg Oral BID  . insulin aspart  0-15 Units Subcutaneous TID WC  . insulin aspart  0-5 Units Subcutaneous QHS  .  insulin glargine  10 Units Subcutaneous QHS  . pantoprazole  40 mg Oral BID AC     Recent Labs Lab 11/06/16 2121 11/07/16 0634 11/08/16 0410  NA 138 133* 133*  K 3.9 3.7 4.0  CL 98* 100* 103  CO2 25 20* 23  BUN 31* 24* 14  CREATININE 1.46* 0.99 0.72  CALCIUM 9.4 8.0* 8.1*  GLUCOSE 380* 391* 375*   Labs: Glucose 375 this morning.  10/21-A1C-14.3% and on 3/15- 13.9%.   Wt Readings from Last 15 Encounters:  11/06/16 98 lb (44.5 kg)  04/08/16 101 lb (45.8 kg)  04/06/16 106 lb 11.2 oz (48.4 kg)  04/01/16 106 lb 12.8 oz (48.4 kg)  02/25/15 115 lb (52.2 kg)  11/12/14 112 lb (50.8 kg)  10/24/14 112 lb 6.4 oz (51 kg)  02/12/14 106 lb (48.1 kg)  02/20/13 130 lb (59 kg)  02/12/13 121 lb (54.9 kg)  01/07/13 125 lb (56.7 kg)  10/30/12 123 lb (55.8 kg)  10/04/12 131 lb 9.8 oz (59.7 kg)  06/07/12 130 lb (59 kg)  01/24/12 130 lb (59 kg)    Body mass index is 18.52 kg/m. Patient meets criteria for borderline underweight based on current BMI. Review of her weight hx shows stable wt for the past 7 months and down 12% compared to 2 years ago during the same time frame.  Current diet order is Heart Healthy, patient is consuming approximately 25-50% of meals at this time. She is complaining of some pain currently and waiting  for her next opportunity for medication but says she is very hungry and would like a snack of Nabs. RD obtained for her and provided a Glucerna Shake for her to drink.    Colman Cater MS,RD,CSG,LDN Office: [phone] Pager: [phone]

## 2016-11-08 NOTE — Progress Notes (Addendum)
Inpatient Diabetes Program Recommendations  AACE/ADA: New Consensus Statement on Inpatient Glycemic Control (2015)  Target Ranges:  Prepandial:   less than 140 mg/dL      Peak postprandial:   less than 180 mg/dL (1-2 hours)      Critically ill patients:  140 - 180 mg/dL   Lab Results  Component Value Date   GLUCAP 98 11/08/2016   HGBA1C 14.3 (H) 11/07/2016    Review of Glycemic Control Results for [name], [name] (MRN [id number]) as of 11/08/2016 13:29  Ref. Range 11/07/2016 07:26 11/07/2016 11:40 11/07/2016 16:48 11/07/2016 21:49 11/08/2016 07:22 11/08/2016 11:09  Glucose-Capillary Latest Ref Range: 65 - 99 mg/dL 425 (H) 167 (H) 354 (H) 248 (H) 346 (H) 98   Diabetes history: DM1 Outpatient Diabetes medications: Lantus 20 units qd + Novolog 2-6 units tid correction scale Current orders for Inpatient glycemic control: Lantus 10 units + Novolog correction 0-15 units tid + 0-5 units hs  Inpatient Diabetes Program Recommendations:  Spoke to patient by phone. Patient is type 1 and has had diabetes since age 27. Patient states she has recently lost approximately 15-20 lbs over the past few weeks. -Increase Lantus to 20 units daily -Add Novolog 4 units tid meal coverage if eats 50% -Decrease Novolog correction to sensitive scale Patient needs on D/C: -Meter with strips # [id number]  Patient has seen endocrinologist Dr. Dorris Fetch in the past and is considering returning to Dr. Dorris Fetch for followup. 1500 Spoke with Dr. Luan Pulling and received orders to make insulin adjustments.  Thank you, Nani Gasser. Chevez Sambrano, RN, MSN, CDE  Diabetes Coordinator Inpatient Glycemic Control Team Team Pager [phone] (8am-5pm) 11/08/2016 2:13 PM

## 2016-11-08 NOTE — Progress Notes (Signed)
Subjective: Abdominal pain improved. No N/V. No pain with eating. Tolerated full liquids yesterday evening. Hungry and wants to advance diet.   Objective: Vital signs in last 24 hours: Temp:  [98.4 F (36.9 C)-99.3 F (37.4 C)] 99.3 F (37.4 C) (10/22 0545) Pulse Rate:  [90-91] 90 (10/22 0545) Resp:  [16] 16 (10/22 0545) BP: (120-141)/(49-59) 141/59 (10/22 0545) SpO2:  [99 %-100 %] 99 % (10/22 0545) Last BM Date: 11/07/16 General:   Alert and oriented, pleasant Head:  Normocephalic and atraumatic. Eyes:  No icterus, sclera clear. Conjuctiva pink.  Abdomen:  Bowel sounds present, soft, mild TTP epigastric/RUQ, non-distended. No HSM or hernias noted. No rebound or guarding. No masses appreciated  Neurologic:  Alert and  oriented x4 Psych:  Alert and cooperative. Normal mood and affect.  Intake/Output from previous day: 10/21 0701 - 10/22 0700 In: 3700 [I.V.:3700] Out: -  Intake/Output this shift: No intake/output data recorded.  Lab Results:  Recent Labs  11/06/16 2121 11/08/16 0410  WBC 11.5* 8.4  HGB 14.1 10.5*  HCT 40.0 30.9*  PLT 415* 263   BMET  Recent Labs  11/06/16 2121 11/07/16 0634  NA 138 133*  K 3.9 3.7  CL 98* 100*  CO2 25 20*  GLUCOSE 380* 391*  BUN 31* 24*  CREATININE 1.46* 0.99  CALCIUM 9.4 8.0*   LFT  Recent Labs  11/06/16 2121 11/07/16 0634  PROT 7.1 5.4*  ALBUMIN 3.2* 2.4*  AST 21 13*  ALT 12* 10*  ALKPHOS 122 92  BILITOT 1.0 0.9     Studies/Results: Ct Abdomen Pelvis W Contrast  Result Date: 11/07/2016 CLINICAL DATA:  Nausea, vomiting, diarrhea, and right upper quadrant pain starting yesterday. EXAM: CT ABDOMEN AND PELVIS WITH CONTRAST TECHNIQUE: Multidetector CT imaging of the abdomen and pelvis was performed using the standard protocol following bolus administration of intravenous contrast. CONTRAST:  54mL ISOVUE-300 IOPAMIDOL (ISOVUE-300) INJECTION 61%, 14mL ISOVUE-300 IOPAMIDOL (ISOVUE-300) INJECTION 61% COMPARISON:   08/11/2010 FINDINGS: Lower chest: The lung bases are clear. Hepatobiliary: No focal liver abnormality is seen. No gallstones, gallbladder wall thickening, or biliary dilatation. Pancreas: Unremarkable. No pancreatic ductal dilatation or surrounding inflammatory changes. Spleen: Normal in size without focal abnormality. Adrenals/Urinary Tract: No adrenal gland nodules. Cyst in the upper pole right kidney. No solid renal mass lesions. Nephrograms are symmetrical. No hydronephrosis or hydroureter. Bladder wall is not thickened and no bladder filling defects are identified. Stomach/Bowel: Stomach and small bowel are mostly decompressed. Contrast material is demonstrated in the distal small bowel suggesting no evidence of obstruction. Scattered stool in the colon. Colon is mostly decompressed as well. No inflammatory bowel changes are demonstrated. The appendix is normal. Vascular/Lymphatic: Diffuse calcification of the abdominal aorta without aneurysm. Possible calcific stenosis of the iliac arteries bilaterally. The iliac arteries and external iliac arteries remain patent. Reproductive: Uterus and bilateral adnexa are unremarkable. Other: No free air or free fluid in the abdomen. No loculated collections. Abdominal wall musculature appears intact. Musculoskeletal: No acute or significant osseous findings. IMPRESSION: No acute process demonstrated in the abdomen or pelvis. No evidence of bowel obstruction or inflammation. Diffuse aortic atherosclerosis with possible calcific stenosis in the iliac arteries. Electronically Signed   By: Lucienne Capers M.D.   On: 11/07/2016 00:08   US Abdomen Limited  Result Date: 11/07/2016 CLINICAL DATA:  Evaluate for gallstones.  Acute pancreatitis. EXAM: ULTRASOUND ABDOMEN LIMITED RIGHT UPPER QUADRANT COMPARISON:  CT scan from earlier today FINDINGS: Gallbladder: No gallstones or wall thickening visualized. No  sonographic Percell Miller sign noted by sonographer. Common bile duct:  Diameter: 4.2 mm Liver: No focal lesion identified. Within normal limits in parenchymal echogenicity. Portal vein is patent on color Doppler imaging with normal direction of blood flow towards the liver. IMPRESSION: Normal study.  No cholelithiasis. Electronically Signed   By: Dorise Bullion III M.D   On: 11/07/2016 12:43    Assessment: 46 year old female admitted with acute pancreatitis. Differentials include idiopathic, autoimmune, biliary. US abdomen yesterday without evidence of gallstones and LFTs normal. Triglycerides mildly elevated and not contributing to etiology. Clinically continues to improve and desires to advance diet. Will try low-fat diet for lunch. Will need MRCP as outpatient.     Plan: Recheck CMP today Decrease IVFs to 125/hour  Continue PPI BID: will change to oral Advance diet to low fat Will arrange follow-up as outpatient. Will arrange MRCP at that time.    Annitta Needs, PhD, ANP-BC [facility]     LOS: 0 days    11/08/2016, 7:52 AM

## 2016-11-09 ENCOUNTER — Telehealth: Payer: Self-pay | Admitting: Gastroenterology

## 2016-11-09 ENCOUNTER — Encounter: Payer: Self-pay | Admitting: Gastroenterology

## 2016-11-09 ENCOUNTER — Other Ambulatory Visit: Payer: Self-pay | Admitting: *Deleted

## 2016-11-09 DIAGNOSIS — K8500 Idiopathic acute pancreatitis without necrosis or infection: Secondary | ICD-10-CM

## 2016-11-09 LAB — GLUCOSE, CAPILLARY
Glucose-Capillary: 92 mg/dL (ref 65–99)
Glucose-Capillary: 62 mg/dL — ABNORMAL LOW (ref 65–99)
Glucose-Capillary: 184 mg/dL — ABNORMAL HIGH (ref 65–99)
Glucose-Capillary: 217 mg/dL — ABNORMAL HIGH (ref 65–99)

## 2016-11-09 LAB — LIPASE, BLOOD: Lipase: 78 U/L — ABNORMAL HIGH (ref 11–51)

## 2016-11-09 MED ORDER — HYDROMORPHONE HCL 1 MG/ML IJ SOLN
1.0000 mg | INTRAMUSCULAR | Status: DC | PRN
  Administered 2016-11-09 – 2016-11-12 (×27): 1 mg via INTRAVENOUS
  Filled 2016-11-09 (×27): qty 1

## 2016-11-09 NOTE — Progress Notes (Signed)
Jeannette Corpus text paged for notification of patient's blood sugar of 74. Verification for whether or not patient should get Lantus. Awaiting response.

## 2016-11-09 NOTE — Progress Notes (Signed)
    Subjective: No pain with eating. Still with RUQ pain but overall improved since admission. No N/V. States pain is not associated with eating and is intermittent. States she is hungry.   Objective: Vital signs in last 24 hours: Temp:  [98.4 F (36.9 C)-99.1 F (37.3 C)] 99.1 F (37.3 C) (10/23 0550) Pulse Rate:  [82-109] 88 (10/23 0550) Resp:  [16] 16 (10/23 0550) BP: (135-182)/(50-75) 144/71 (10/23 0550) SpO2:  [96 %-100 %] 100 % (10/23 0550) Last BM Date: 11/05/16 General:   Alert and oriented, pleasant Head:  Normocephalic and atraumatic. Abdomen:  Bowel sounds present, soft, TTP RUQ, epigastric, no rebound or guarding Neurologic:  Alert and  oriented x4 Psych:  Alert and cooperative. Normal mood and affect.  Intake/Output from previous day: 10/22 0701 - 10/23 0700 In: 3301.3 [P.O.:1200; I.V.:2101.3] Out: -  Intake/Output this shift: No intake/output data recorded.  Lab Results:  Recent Labs  11/06/16 2121 11/08/16 0410  WBC 11.5* 8.4  HGB 14.1 10.5*  HCT 40.0 30.9*  PLT 415* 263   BMET  Recent Labs  11/06/16 2121 11/07/16 0634 11/08/16 0410  NA 138 133* 133*  K 3.9 3.7 4.0  CL 98* 100* 103  CO2 25 20* 23  GLUCOSE 380* 391* 375*  BUN 31* 24* 14  CREATININE 1.46* 0.99 0.72  CALCIUM 9.4 8.0* 8.1*   LFT  Recent Labs  11/06/16 2121 11/07/16 0634 11/08/16 0410  PROT 7.1 5.4* 4.6*  ALBUMIN 3.2* 2.4* 2.0*  AST 21 13* 12*  ALT 12* 10* 8*  ALKPHOS 122 92 84  BILITOT 1.0 0.9 0.3   Lab Results  Component Value Date   LIPASE 78 (H) 11/09/2016     Studies/Results: US Abdomen Limited  Result Date: 11/07/2016 CLINICAL DATA:  Evaluate for gallstones.  Acute pancreatitis. EXAM: ULTRASOUND ABDOMEN LIMITED RIGHT UPPER QUADRANT COMPARISON:  CT scan from earlier today FINDINGS: Gallbladder: No gallstones or wall thickening visualized. No sonographic Murphy sign noted by sonographer. Common bile duct: Diameter: 4.2 mm Liver: No focal lesion identified.  Within normal limits in parenchymal echogenicity. Portal vein is patent on color Doppler imaging with normal direction of blood flow towards the liver. IMPRESSION: Normal study.  No cholelithiasis. Electronically Signed   By: Dorise Bullion III M.D   On: 11/07/2016 12:43    Assessment: 46 year old female admitted with acute pancreatitis. Differentials include idiopathic, autoimmune, biliary. US abdomen this admission without evidence of gallstones and LFTs normal. Triglycerides mildly elevated and not contributing to etiology. Improved with admission but still with pain this morning, unassociated with eating. Pain control measures adjusted by attending.   Plan: Supportive measures, monitor for worsening of symptoms Continue PPI BID Will need outpatient follow-up and MRCP: will arrange   Annitta Needs, PhD, ANP-BC [facility]    LOS: 1 day    11/09/2016, 7:59 AM

## 2016-11-09 NOTE — Telephone Encounter (Addendum)
MRCP IN 4-6 WEEKS, Dx: ACUTE IDIOPATHIC PANCREATITIS, OPV E30 IN 2-3 MOS, Dx; ACUTE IDIOPATHIC PANCREATITIS.

## 2016-11-09 NOTE — Progress Notes (Signed)
Inpatient Diabetes Program Recommendations  AACE/ADA: New Consensus Statement on Inpatient Glycemic Control (2015)  Target Ranges:  Prepandial:   less than 140 mg/dL      Peak postprandial:   less than 180 mg/dL (1-2 hours)      Critically ill patients:  140 - 180 mg/dL   Results for LILIANNE, DELAIR (MRN [id number]) as of 11/09/2016 12:51  Ref. Range 11/08/2016 07:22 11/08/2016 11:09 11/08/2016 16:42 11/08/2016 20:31  Glucose-Capillary Latest Ref Range: 65 - 99 mg/dL 346 (H)  11 units Novolog 98 248 (H)  7 units Novolog 197 (H)  20 units Lantus   Results for YAZHINI, MCAULAY (MRN [id number]) as of 11/09/2016 12:51  Ref. Range 11/09/2016 07:23 11/09/2016 11:24  Glucose-Capillary Latest Ref Range: 65 - 99 mg/dL 92 62 (L)    Home DM Meds: Lantus 20 units QHS       Novolog 2-10 units TID per SSI   Current Insulin Orders: Lantus 20 units QHS      Novolog Sensitive Correction Scale/ SSI (0-9 units) TID AC + HS      Novolog 4 units TID with meals      MD- Please consider the following in-hospital insulin adjustments:  1. Reduce Lantus slightly to 18 units QHS  2. Reduce Novolog Meal Coverage to: Novolog 3 units TID with meals (hold if pt eats <50% of meal)      --Will follow patient during hospitalization--  Wyn Quaker RN, MSN, CDE Diabetes Coordinator Inpatient Glycemic Control Team Team Pager: [phone] (8a-5p)

## 2016-11-09 NOTE — Progress Notes (Signed)
Subjective: She says she has more pain. She has no new complaints. She says she is still hungry.  Objective: Vital signs in last 24 hours: Temp:  [98.4 F (36.9 C)-99.1 F (37.3 C)] 99.1 F (37.3 C) (10/23 0550) Pulse Rate:  [82-109] 88 (10/23 0550) Resp:  [16] 16 (10/23 0550) BP: (135-182)/(50-75) 144/71 (10/23 0550) SpO2:  [94 %-100 %] 94 % (10/23 0839) Weight change:  Last BM Date: 11/05/16  Intake/Output from previous day: 10/22 0701 - 10/23 0700 In: 3301.3 [P.O.:1200; I.V.:2101.3] Out: -   PHYSICAL EXAM General appearance: alert, cooperative and mild distress Resp: clear to auscultation bilaterally Cardio: regular rate and rhythm, S1, S2 normal, no murmur, click, rub or gallop GI: She has mild abdominal tenderness 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  Lab Results:  Results for orders placed or performed during the hospital encounter of 11/06/16 (from the past 48 hour(s))  Glucose, capillary     Status: Abnormal   Collection Time: 11/07/16 11:40 AM  Result Value Ref Range   Glucose-Capillary 167 (H) 65 - 99 mg/dL  Glucose, capillary     Status: Abnormal   Collection Time: 11/07/16  4:48 PM  Result Value Ref Range   Glucose-Capillary 354 (H) 65 - 99 mg/dL  Glucose, capillary     Status: Abnormal   Collection Time: 11/07/16  9:49 PM  Result Value Ref Range   Glucose-Capillary 248 (H) 65 - 99 mg/dL  Lipase, blood     Status: Abnormal   Collection Time: 11/08/16  4:10 AM  Result Value Ref Range   Lipase 120 (H) 11 - 51 U/L  CBC with Differential/Platelet     Status: Abnormal   Collection Time: 11/08/16  4:10 AM  Result Value Ref Range   WBC 8.4 4.0 - 10.5 K/uL   RBC 3.66 (L) 3.87 - 5.11 MIL/uL   Hemoglobin 10.5 (L) 12.0 - 15.0 g/dL    Comment: DELTA CHECK NOTED POSSIBLE DEHYDRATION ON PREVIOUS DRAW PER RN.    HCT 30.9 (L) 36.0 - 46.0 %   MCV 84.4 78.0 - 100.0 fL   MCH 28.7 26.0 - 34.0 pg   MCHC 34.0 30.0 - 36.0 g/dL   RDW 13.5 11.5  - 15.5 %   Platelets 263 150 - 400 K/uL   Neutrophils Relative % 55 %   Neutro Abs 4.6 1.7 - 7.7 K/uL   Lymphocytes Relative 35 %   Lymphs Abs 3.0 0.7 - 4.0 K/uL   Monocytes Relative 7 %   Monocytes Absolute 0.6 0.1 - 1.0 K/uL   Eosinophils Relative 3 %   Eosinophils Absolute 0.2 0.0 - 0.7 K/uL   Basophils Relative 0 %   Basophils Absolute 0.0 0.0 - 0.1 K/uL  Comprehensive metabolic panel     Status: Abnormal   Collection Time: 11/08/16  4:10 AM  Result Value Ref Range   Sodium 133 (L) 135 - 145 mmol/L   Potassium 4.0 3.5 - 5.1 mmol/L   Chloride 103 101 - 111 mmol/L   CO2 23 22 - 32 mmol/L   Glucose, Bld 375 (H) 65 - 99 mg/dL   BUN 14 6 - 20 mg/dL   Creatinine, Ser 0.72 0.44 - 1.00 mg/dL   Calcium 8.1 (L) 8.9 - 10.3 mg/dL   Total Protein 4.6 (L) 6.5 - 8.1 g/dL   Albumin 2.0 (L) 3.5 - 5.0 g/dL   AST 12 (L) 15 - 41 U/L   ALT 8 (L) 14 - 54 U/L  Alkaline Phosphatase 84 38 - 126 U/L   Total Bilirubin 0.3 0.3 - 1.2 mg/dL   GFR calc non Af Amer >60 >60 mL/min   GFR calc Af Amer >60 >60 mL/min    Comment: (NOTE) The eGFR has been calculated using the CKD EPI equation. This calculation has not been validated in all clinical situations. eGFR's persistently <60 mL/min signify possible Chronic Kidney Disease.    Anion gap 7 5 - 15  Glucose, capillary     Status: Abnormal   Collection Time: 11/08/16  7:22 AM  Result Value Ref Range   Glucose-Capillary 346 (H) 65 - 99 mg/dL   Comment 1 Notify RN    Comment 2 Document in Chart   Glucose, capillary     Status: None   Collection Time: 11/08/16 11:09 AM  Result Value Ref Range   Glucose-Capillary 98 65 - 99 mg/dL   Comment 1 Notify RN    Comment 2 Document in Chart   Glucose, capillary     Status: Abnormal   Collection Time: 11/08/16  4:42 PM  Result Value Ref Range   Glucose-Capillary 248 (H) 65 - 99 mg/dL   Comment 1 Notify RN    Comment 2 Document in Chart   Glucose, capillary     Status: Abnormal   Collection Time:  11/08/16  8:31 PM  Result Value Ref Range   Glucose-Capillary 197 (H) 65 - 99 mg/dL   Comment 1 Notify RN    Comment 2 Document in Chart   Lipase, blood     Status: Abnormal   Collection Time: 11/09/16  4:00 AM  Result Value Ref Range   Lipase 78 (H) 11 - 51 U/L  Glucose, capillary     Status: None   Collection Time: 11/09/16  7:23 AM  Result Value Ref Range   Glucose-Capillary 92 65 - 99 mg/dL   Comment 1 Notify RN    Comment 2 Document in Chart     ABGS  Recent Labs  11/06/16 2122  HCO3 22.6   CULTURES No results found for this or any previous visit (from the past 240 hour(s)). Studies/Results: US Abdomen Limited  Result Date: 11/07/2016 CLINICAL DATA:  Evaluate for gallstones.  Acute pancreatitis. EXAM: ULTRASOUND ABDOMEN LIMITED RIGHT UPPER QUADRANT COMPARISON:  CT scan from earlier today FINDINGS: Gallbladder: No gallstones or wall thickening visualized. No sonographic Murphy sign noted by sonographer. Common bile duct: Diameter: 4.2 mm Liver: No focal lesion identified. Within normal limits in parenchymal echogenicity. Portal vein is patent on color Doppler imaging with normal direction of blood flow towards the liver. IMPRESSION: Normal study.  No cholelithiasis. Electronically Signed   By: Dorise Bullion III M.D   On: 11/07/2016 12:43    Medications:  Prior to Admission:  Prescriptions Prior to Admission  Medication Sig Dispense Refill Last Dose  . ALPRAZolam (XANAX) 1 MG tablet Take 1 mg by mouth 3 (three) times daily as needed for anxiety.   Past Week at Unknown time  . apixaban (ELIQUIS) 5 MG TABS tablet Take 5 mg by mouth 2 (two) times daily.   11/07/2016 at 0900  . insulin aspart (NOVOLOG FLEXPEN) 100 UNIT/ML injection Inject 2-10 Units into the skin 3 (three) times daily before meals. Sliding scale as follows 200-250=2 units 251-300=4 units 301-350=6 units 351-400=8 units >400-give 10 units and Notify MD   11/06/2016 at Unknown time  . Insulin Glargine  (LANTUS SOLOSTAR) 100 UNIT/ML SOPN Inject 15 Units into the skin at bedtime. (  Patient taking differently: Inject 20 Units into the skin at bedtime. )   11/06/2016 at Unknown time  . nitroGLYCERIN (NITROSTAT) 0.4 MG SL tablet Place 1 tablet (0.4 mg total) under the tongue every 5 (five) minutes as needed for chest pain. 25 tablet 5 unknown  . oxyCODONE-acetaminophen (PERCOCET) 10-325 MG per tablet Take 1 tablet by mouth 4 (four) times daily.   11/06/2016 at Unknown time  . promethazine (PHENERGAN) 25 MG tablet Take 25 mg by mouth every 6 (six) hours as needed for nausea or vomiting.   11/06/2016 at Unknown time   Scheduled: . apixaban  5 mg Oral BID  . insulin aspart  0-5 Units Subcutaneous QHS  . insulin aspart  0-9 Units Subcutaneous TID WC  . insulin aspart  4 Units Subcutaneous TID WC  . insulin glargine  20 Units Subcutaneous QHS  . pantoprazole  40 mg Oral BID AC   Continuous: . lactated ringers 50 mL/hr at 11/08/16 1720   NOB:[id number] **OR** acetaminophen, ALPRAZolam, HYDROmorphone (DILAUDID) injection, ondansetron **OR** ondansetron (ZOFRAN) IV  Assesment: She was admitted with acute pancreatitis. She is now complaining of more pain in her abdomen but also says she's hungry. Her abdomen is mildly tender. 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 I think she may be having some trouble with relative pain medication withdrawal. Active Problems:   Pancreatitis, acute    Plan: Increase pain medication. Continue other treatments. Try to get her blood sugar better controlled. Diabetic teaching is underway.    LOS: 1 day   Brandis Wixted L 11/09/2016, 8:46 AM

## 2016-11-09 NOTE — Telephone Encounter (Signed)
Please have patient return after outpatient MRCP for hospital follow-up (pancreatitis).

## 2016-11-09 NOTE — Progress Notes (Addendum)
Noted patient at doorway screaming "somebody help me", patient states she "was in her bed, got onto the bedside commode and scoot herself to the door". Patient states she "used the call light", call light in range and works properly, Network engineer states call light was not in use. Patient given PRN xanax and PRN dilaudid, patient states "pain 10/10". Patient assisted back to bed. Patient screams "if I just had a leg".  Once patient was given medications, patient instantly became calm.

## 2016-11-09 NOTE — Progress Notes (Addendum)
Hypoglycemic Event  CBG: 62  Treatment: juice   Symptoms: none  Follow-up CBG Result 184  Possible Reasons for Event: patient not eating properly   Comments/MD notified: Hawkins    [name]

## 2016-11-09 NOTE — Telephone Encounter (Signed)
PATIENT SCHEDULED  °

## 2016-11-10 LAB — GLUCOSE, CAPILLARY
Glucose-Capillary: 74 mg/dL (ref 65–99)
Glucose-Capillary: 267 mg/dL — ABNORMAL HIGH (ref 65–99)
Glucose-Capillary: 253 mg/dL — ABNORMAL HIGH (ref 65–99)
Glucose-Capillary: 38 mg/dL — CL (ref 65–99)
Glucose-Capillary: 108 mg/dL — ABNORMAL HIGH (ref 65–99)
Glucose-Capillary: 159 mg/dL — ABNORMAL HIGH (ref 65–99)
Glucose-Capillary: 209 mg/dL — ABNORMAL HIGH (ref 65–99)
Glucose-Capillary: 65 mg/dL (ref 65–99)
Glucose-Capillary: 74 mg/dL (ref 65–99)
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
## 2016-11-10 NOTE — Progress Notes (Signed)
Pt c/o extreme epigastric pain in upper (R) side, not relieved by q2h PRN dilaudid. She is crying consistently saying "she's tired" and she "can't take it". Made MD aware in prev text page concerning hypoglycemia.

## 2016-11-10 NOTE — Progress Notes (Signed)
Pt CBG 65. Writer gave juice with sugar, rechecked after 15 minutes and it came up to 74. Held HS Lantus and sliding scale insulin. Text paged MD to make aware.

## 2016-11-10 NOTE — Progress Notes (Signed)
Subjective: Events of last night noted. She is still very anxious this morning. She is crying out with pain in her abdomen. She still says she is hungry.  Objective: Vital signs in last 24 hours: Temp:  [98.4 F (36.9 C)-98.6 F (37 C)] 98.6 F (37 C) (10/24 0400) Pulse Rate:  [78-111] 111 (10/24 0400) Resp:  [18] 18 (10/24 0400) BP: (135-182)/(55-79) 182/79 (10/24 0400) SpO2:  [94 %-99 %] 98 % (10/24 0400) Weight change:  Last BM Date: 11/08/16  Intake/Output from previous day: 10/23 0701 - 10/24 0700 In: 480 [P.O.:480] Out: 500 [Urine:500]  PHYSICAL EXAM General appearance: alert and moderate distress Resp: clear to auscultation bilaterally Cardio: regular rate and rhythm, S1, S2 normal, no murmur, click, rub or gallop GI: She is tender in the epigastric area ExtremitieNo edema. 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 She is now oriented but very anxious  Lab Results:  Results for orders placed or performed during the hospital encounter of 11/06/16 (from the past 48 hour(s))  Glucose, capillary     Status: None   Collection Time: 11/08/16 11:09 AM  Result Value Ref Range   Glucose-Capillary 98 65 - 99 mg/dL   Comment 1 Notify RN    Comment 2 Document in Chart   Glucose, capillary     Status: Abnormal   Collection Time: 11/08/16  4:42 PM  Result Value Ref Range   Glucose-Capillary 248 (H) 65 - 99 mg/dL   Comment 1 Notify RN    Comment 2 Document in Chart   Glucose, capillary     Status: Abnormal   Collection Time: 11/08/16  8:31 PM  Result Value Ref Range   Glucose-Capillary 197 (H) 65 - 99 mg/dL   Comment 1 Notify RN    Comment 2 Document in Chart   Lipase, blood     Status: Abnormal   Collection Time: 11/09/16  4:00 AM  Result Value Ref Range   Lipase 78 (H) 11 - 51 U/L  Glucose, capillary     Status: None   Collection Time: 11/09/16  7:23 AM  Result Value Ref Range   Glucose-Capillary 92 65 - 99 mg/dL   Comment 1 Notify RN    Comment 2 Document in Chart   Glucose, capillary     Status: Abnormal   Collection Time: 11/09/16 11:24 AM  Result Value Ref Range   Glucose-Capillary 62 (L) 65 - 99 mg/dL   Comment 1 Notify RN    Comment 2 Document in Chart   Glucose, capillary     Status: Abnormal   Collection Time: 11/09/16 12:57 PM  Result Value Ref Range   Glucose-Capillary 184 (H) 65 - 99 mg/dL   Comment 1 Notify RN    Comment 2 Document in Chart   Glucose, capillary     Status: Abnormal   Collection Time: 11/09/16  4:06 PM  Result Value Ref Range   Glucose-Capillary 217 (H) 65 - 99 mg/dL   Comment 1 Notify RN    Comment 2 Document in Chart   Glucose, capillary     Status: None   Collection Time: 11/09/16  8:52 PM  Result Value Ref Range   Glucose-Capillary 74 65 - 99 mg/dL  Glucose, capillary     Status: Abnormal   Collection Time: 11/10/16  2:31 AM  Result Value Ref Range   Glucose-Capillary 267 (H) 65 - 99 mg/dL  Glucose, capillary     Status: Abnormal   Collection Time: 11/10/16  7:33 AM  Result Value Ref Range   Glucose-Capillary 253 (H) 65 - 99 mg/dL   Comment 1 Notify RN    Comment 2 Document in Chart     ABGS No results for input(s): PHART, PO2ART, TCO2, HCO3 in the last 72 hours.  Invalid input(s): PCO2 CULTURES No results found for this or any previous visit (from the past 240 hour(s)). Studies/Results: No results found.  Medications:  Prior to Admission:  Prescriptions Prior to Admission  Medication Sig Dispense Refill Last Dose  . ALPRAZolam (XANAX) 1 MG tablet Take 1 mg by mouth 3 (three) times daily as needed for anxiety.   Past Week at Unknown time  . apixaban (ELIQUIS) 5 MG TABS tablet Take 5 mg by mouth 2 (two) times daily.   11/07/2016 at 0900  . insulin aspart (NOVOLOG FLEXPEN) 100 UNIT/ML injection Inject 2-10 Units into the skin 3 (three) times daily before meals. Sliding scale as follows 200-250=2 units 251-300=4 units 301-350=6 units 351-400=8 units >400-give 10  units and Notify MD   11/06/2016 at Unknown time  . Insulin Glargine (LANTUS SOLOSTAR) 100 UNIT/ML SOPN Inject 15 Units into the skin at bedtime. (Patient taking differently: Inject 20 Units into the skin at bedtime. )   11/06/2016 at Unknown time  . nitroGLYCERIN (NITROSTAT) 0.4 MG SL tablet Place 1 tablet (0.4 mg total) under the tongue every 5 (five) minutes as needed for chest pain. 25 tablet 5 unknown  . oxyCODONE-acetaminophen (PERCOCET) 10-325 MG per tablet Take 1 tablet by mouth 4 (four) times daily.   11/06/2016 at Unknown time  . promethazine (PHENERGAN) 25 MG tablet Take 25 mg by mouth every 6 (six) hours as needed for nausea or vomiting.   11/06/2016 at Unknown time   Scheduled: . apixaban  5 mg Oral BID  . insulin aspart  0-5 Units Subcutaneous QHS  . insulin aspart  0-9 Units Subcutaneous TID WC  . insulin aspart  4 Units Subcutaneous TID WC  . insulin glargine  20 Units Subcutaneous QHS  . pantoprazole  40 mg Oral BID AC   Continuous: . lactated ringers 50 mL/hr at 11/09/16 1332   OIP:[id number] **OR** acetaminophen, ALPRAZolam, HYDROmorphone (DILAUDID) injection, ondansetron **OR** ondansetron (ZOFRAN) IV  Assesment: She was admitted with pancreatitis. She is still having abdominal pain and I'm going to put her back on clear liquid diet. She does say she is hungry but I think we may be making things worse by feeding her. She's had acute confusion I think perhaps from being out of her normal environment. She has diabetes which is fairly well controlled with no evidence of hypoglycemia. At baseline she has anxiety and depression and perhaps bipolar disease. Active Problems:   Pancreatitis, acute    Plan: Clear liquid diet. Continue alprazolam. Continue pain medication    LOS: 2 days   Manasi Dishon L 11/10/2016, 8:45 AM

## 2016-11-10 NOTE — Telephone Encounter (Signed)
PATIENT SCHEDULED  °

## 2016-11-11 LAB — GLUCOSE, CAPILLARY
Glucose-Capillary: 154 mg/dL — ABNORMAL HIGH (ref 65–99)
Glucose-Capillary: 250 mg/dL — ABNORMAL HIGH (ref 65–99)
Glucose-Capillary: 45 mg/dL — ABNORMAL LOW (ref 65–99)
Glucose-Capillary: 88 mg/dL (ref 65–99)
Glucose-Capillary: 163 mg/dL — ABNORMAL HIGH (ref 65–99)

## 2016-11-11 NOTE — Progress Notes (Addendum)
Inpatient Diabetes Program Recommendations  AACE/ADA: New Consensus Statement on Inpatient Glycemic Control (2015)  Target Ranges:  Prepandial:   less than 140 mg/dL      Peak postprandial:   less than 180 mg/dL (1-2 hours)      Critically ill patients:  140 - 180 mg/dL   Results for [name], [name] (MRN [id number]) as of 11/11/2016 09:25  Ref. Range 11/10/2016 07:33 11/10/2016 11:13 11/10/2016 12:36 11/10/2016 13:00 11/10/2016 16:52 11/10/2016 22:48 11/10/2016 23:27 11/11/2016 08:03  Glucose-Capillary Latest Ref Range: 65 - 99 mg/dL 253 (H) Nov 9 units given including 4 Meal Coverage 38 (LL) 108 (H) 159 (H) 209 (H) Nov 7 units given including Meal Coverage 65 74 154 (H)   Review of Glycemic Control  Inpatient Diabetes Program Recommendations:    Patient with hypoglycemia x2 yesterday, first time in the 30's, second time in the 60's however patient had cheeseburger. It's challenging to determine insulin needs due to pancreatitis and patient eating what is not ordered. While on clear liquid diet please consider d/cing meal coverage and order Novolog Correction Q4 hours.  Thanks,  Tama Headings RN, MSN, [facility] Inpatient Diabetes Coordinator Team Pager [phone] (8a-5p)

## 2016-11-11 NOTE — Progress Notes (Signed)
Discussed briefly with Dr. Luan Pulling. Patient remains inpatient due to ongoing abdominal pain. Patient ate cheeseburger last night brought in by family member. Offered reevaluation but he declined and will call if further assistance needed. Plan as previously outlined.   Laureen Ochs. Bernarda Caffey [facility] [phone] 10/25/20189:19 AM

## 2016-11-11 NOTE — Progress Notes (Signed)
Subjective: She is still complaining of abdominal pain. After I changed her to clear liquid diet yesterday she had family bring her a cheeseburger.  Objective: Vital signs in last 24 hours: Temp:  [98.2 F (36.8 C)-98.7 F (37.1 C)] 98.2 F (36.8 C) (10/25 0600) Pulse Rate:  [81-93] 81 (10/25 0600) Resp:  [16-18] 16 (10/25 0600) BP: (142-154)/(62-66) 150/62 (10/25 0600) SpO2:  [98 %-99 %] 99 % (10/25 0600) Weight change:  Last BM Date: 11/08/16  Intake/Output from previous day: 10/24 0701 - 10/25 0700 In: -  Out: 900 [Urine:900]  PHYSICAL EXAM General appearance: alert and mild distress Resp: clear to auscultation bilaterally Cardio: regular rate and rhythm, S1, S2 normal, no murmur, click, rub or gallop GI: Mild abdominal tenderness Extremities: extremities normal, atraumatic, no cyanosis or edema Skin warm and dry. Amputation stump is okay  Lab Results:  Results for orders placed or performed during the hospital encounter of 11/06/16 (from the past 48 hour(s))  Glucose, capillary     Status: Abnormal   Collection Time: 11/09/16 11:24 AM  Result Value Ref Range   Glucose-Capillary 62 (L) 65 - 99 mg/dL   Comment 1 Notify RN    Comment 2 Document in Chart   Glucose, capillary     Status: Abnormal   Collection Time: 11/09/16 12:57 PM  Result Value Ref Range   Glucose-Capillary 184 (H) 65 - 99 mg/dL   Comment 1 Notify RN    Comment 2 Document in Chart   Glucose, capillary     Status: Abnormal   Collection Time: 11/09/16  4:06 PM  Result Value Ref Range   Glucose-Capillary 217 (H) 65 - 99 mg/dL   Comment 1 Notify RN    Comment 2 Document in Chart   Glucose, capillary     Status: None   Collection Time: 11/09/16  8:52 PM  Result Value Ref Range   Glucose-Capillary 74 65 - 99 mg/dL  Glucose, capillary     Status: Abnormal   Collection Time: 11/10/16  2:31 AM  Result Value Ref Range   Glucose-Capillary 267 (H) 65 - 99 mg/dL  Glucose, capillary     Status: Abnormal    Collection Time: 11/10/16  7:33 AM  Result Value Ref Range   Glucose-Capillary 253 (H) 65 - 99 mg/dL   Comment 1 Notify RN    Comment 2 Document in Chart   Glucose, capillary     Status: Abnormal   Collection Time: 11/10/16 11:13 AM  Result Value Ref Range   Glucose-Capillary 38 (LL) 65 - 99 mg/dL   Comment 1 Notify RN    Comment 2 Document in Chart   Glucose, capillary     Status: Abnormal   Collection Time: 11/10/16 12:36 PM  Result Value Ref Range   Glucose-Capillary 108 (H) 65 - 99 mg/dL   Comment 1 Notify RN    Comment 2 Document in Chart   Glucose, capillary     Status: Abnormal   Collection Time: 11/10/16  1:00 PM  Result Value Ref Range   Glucose-Capillary 159 (H) 65 - 99 mg/dL  Glucose, capillary     Status: Abnormal   Collection Time: 11/10/16  4:52 PM  Result Value Ref Range   Glucose-Capillary 209 (H) 65 - 99 mg/dL   Comment 1 Notify RN    Comment 2 Document in Chart   Glucose, capillary     Status: None   Collection Time: 11/10/16 10:48 PM  Result Value Ref Range  Glucose-Capillary 65 65 - 99 mg/dL  Glucose, capillary     Status: None   Collection Time: 11/10/16 11:27 PM  Result Value Ref Range   Glucose-Capillary 74 65 - 99 mg/dL  Glucose, capillary     Status: Abnormal   Collection Time: 11/11/16  8:03 AM  Result Value Ref Range   Glucose-Capillary 154 (H) 65 - 99 mg/dL    ABGS No results for input(s): PHART, PO2ART, TCO2, HCO3 in the last 72 hours.  Invalid input(s): PCO2 CULTURES No results found for this or any previous visit (from the past 240 hour(s)). Studies/Results: No results found.  Medications:  Prior to Admission:  Prescriptions Prior to Admission  Medication Sig Dispense Refill Last Dose  . ALPRAZolam (XANAX) 1 MG tablet Take 1 mg by mouth 3 (three) times daily as needed for anxiety.   Past Week at Unknown time  . apixaban (ELIQUIS) 5 MG TABS tablet Take 5 mg by mouth 2 (two) times daily.   11/07/2016 at 0900  . insulin aspart  (NOVOLOG FLEXPEN) 100 UNIT/ML injection Inject 2-10 Units into the skin 3 (three) times daily before meals. Sliding scale as follows 200-250=2 units 251-300=4 units 301-350=6 units 351-400=8 units >400-give 10 units and Notify MD   11/06/2016 at Unknown time  . Insulin Glargine (LANTUS SOLOSTAR) 100 UNIT/ML SOPN Inject 15 Units into the skin at bedtime. (Patient taking differently: Inject 20 Units into the skin at bedtime. )   11/06/2016 at Unknown time  . nitroGLYCERIN (NITROSTAT) 0.4 MG SL tablet Place 1 tablet (0.4 mg total) under the tongue every 5 (five) minutes as needed for chest pain. 25 tablet 5 unknown  . oxyCODONE-acetaminophen (PERCOCET) 10-325 MG per tablet Take 1 tablet by mouth 4 (four) times daily.   11/06/2016 at Unknown time  . promethazine (PHENERGAN) 25 MG tablet Take 25 mg by mouth every 6 (six) hours as needed for nausea or vomiting.   11/06/2016 at Unknown time   Scheduled: . apixaban  5 mg Oral BID  . insulin aspart  0-5 Units Subcutaneous QHS  . insulin aspart  0-9 Units Subcutaneous TID WC  . insulin aspart  4 Units Subcutaneous TID WC  . insulin glargine  20 Units Subcutaneous QHS  . pantoprazole  40 mg Oral BID AC   Continuous: . lactated ringers 50 mL/hr at 11/11/16 0435   EHU:[id number] **OR** acetaminophen, ALPRAZolam, HYDROmorphone (DILAUDID) injection, ondansetron **OR** ondansetron (ZOFRAN) IV  Assesment:she was admitted with pancreatitis. She had complained severely of abdominal pain yesterday so I changed her to clear liquid diet but then she had someone bring her in a cheeseburger and she ate it. She's having more pain since then. I told her if she does that again I will discharge her from the hospital because she has to cooperate for [name] to get her better. Active Problems:   Pancreatitis, acute    Plan: Continue clear liquids for now.    LOS: 3 days   Magdelyn Roebuck L 11/11/2016, 9:06 AM

## 2016-11-11 NOTE — Care Management Note (Signed)
Case Management Note  Patient Details  Name: ALEXSIS KATHMAN MRN: [id number] Date of Birth: [birth year]  If discussed at Long Length of Stay Meetings, dates discussed:  11/11/2016  Additional Comments:  Sherald Barge, RN 11/11/2016, 12:59 PM

## 2016-11-12 LAB — GLUCOSE, CAPILLARY
Glucose-Capillary: 139 mg/dL — ABNORMAL HIGH (ref 65–99)
Glucose-Capillary: 107 mg/dL — ABNORMAL HIGH (ref 65–99)
Glucose-Capillary: 295 mg/dL — ABNORMAL HIGH (ref 65–99)

## 2016-11-12 MED ORDER — OXYCODONE-ACETAMINOPHEN 10-325 MG PO TABS: 1.0000 | ORAL_TABLET | ORAL | Status: DC | PRN

## 2016-11-12 MED ORDER — OXYCODONE HCL 5 MG PO TABS
5.0000 mg | ORAL_TABLET | ORAL | Status: DC | PRN
  Administered 2016-11-12 – 2016-11-13 (×4): 5 mg via ORAL
  Filled 2016-11-12 (×4): qty 1

## 2016-11-12 MED ORDER — OXYCODONE-ACETAMINOPHEN 5-325 MG PO TABS
1.0000 | ORAL_TABLET | ORAL | Status: DC | PRN
  Administered 2016-11-12 – 2016-11-13 (×4): 1 via ORAL
  Filled 2016-11-12 (×4): qty 1

## 2016-11-12 NOTE — Progress Notes (Signed)
Subjective: Events of last night noted. Her behavior during this hospitalization has been very different than her normal behavior at home and I think it's probably related to taking hydromorphone for pain.  Objective: Vital signs in last 24 hours: Temp:  [98 F (36.7 C)-98.3 F (36.8 C)] 98.2 F (36.8 C) (10/26 0518) Pulse Rate:  [80-89] 84 (10/26 0518) Resp:  [16] 16 (10/26 0518) BP: (148-162)/(56-65) 158/62 (10/26 0518) SpO2:  [99 %-100 %] 100 % (10/26 0518) Weight change:  Last BM Date: 11/11/16  Intake/Output from previous day: 10/25 0701 - 10/26 0700 In: 600 [P.O.:600] Out: -   PHYSICAL EXAM General appearance: alert, mild distress and Confused Resp: clear to auscultation bilaterally Cardio: regular rate and rhythm, S1, S2 normal, no murmur, click, rub or gallop GI: Minimal tenderness Extremities: Previous amputation well-healed Skin warm and dry  Lab Results:  Results for orders placed or performed during the hospital encounter of 11/06/16 (from the past 48 hour(s))  Glucose, capillary     Status: Abnormal   Collection Time: 11/10/16 11:13 AM  Result Value Ref Range   Glucose-Capillary 38 (LL) 65 - 99 mg/dL   Comment 1 Notify RN    Comment 2 Document in Chart   Glucose, capillary     Status: Abnormal   Collection Time: 11/10/16 12:36 PM  Result Value Ref Range   Glucose-Capillary 108 (H) 65 - 99 mg/dL   Comment 1 Notify RN    Comment 2 Document in Chart   Glucose, capillary     Status: Abnormal   Collection Time: 11/10/16  1:00 PM  Result Value Ref Range   Glucose-Capillary 159 (H) 65 - 99 mg/dL  Glucose, capillary     Status: Abnormal   Collection Time: 11/10/16  4:52 PM  Result Value Ref Range   Glucose-Capillary 209 (H) 65 - 99 mg/dL   Comment 1 Notify RN    Comment 2 Document in Chart   Glucose, capillary     Status: None   Collection Time: 11/10/16 10:48 PM  Result Value Ref Range   Glucose-Capillary 65 65 - 99 mg/dL  Glucose, capillary     Status:  None   Collection Time: 11/10/16 11:27 PM  Result Value Ref Range   Glucose-Capillary 74 65 - 99 mg/dL  Glucose, capillary     Status: Abnormal   Collection Time: 11/11/16  8:03 AM  Result Value Ref Range   Glucose-Capillary 154 (H) 65 - 99 mg/dL  Glucose, capillary     Status: Abnormal   Collection Time: 11/11/16 11:19 AM  Result Value Ref Range   Glucose-Capillary 250 (H) 65 - 99 mg/dL  Glucose, capillary     Status: Abnormal   Collection Time: 11/11/16  4:45 PM  Result Value Ref Range   Glucose-Capillary 45 (L) 65 - 99 mg/dL  Glucose, capillary     Status: None   Collection Time: 11/11/16  5:24 PM  Result Value Ref Range   Glucose-Capillary 88 65 - 99 mg/dL  Glucose, capillary     Status: Abnormal   Collection Time: 11/11/16  8:57 PM  Result Value Ref Range   Glucose-Capillary 163 (H) 65 - 99 mg/dL   Comment 1 Notify RN    Comment 2 Document in Chart   Glucose, capillary     Status: Abnormal   Collection Time: 11/12/16  8:07 AM  Result Value Ref Range   Glucose-Capillary 139 (H) 65 - 99 mg/dL   Comment 1 Call MD NNP PA CNM  ABGS No results for input(s): PHART, PO2ART, TCO2, HCO3 in the last 72 hours.  Invalid input(s): PCO2 CULTURES No results found for this or any previous visit (from the past 240 hour(s)). Studies/Results: No results found.  Medications:  Prior to Admission:  Prescriptions Prior to Admission  Medication Sig Dispense Refill Last Dose  . ALPRAZolam (XANAX) 1 MG tablet Take 1 mg by mouth 3 (three) times daily as needed for anxiety.   Past Week at Unknown time  . apixaban (ELIQUIS) 5 MG TABS tablet Take 5 mg by mouth 2 (two) times daily.   11/07/2016 at 0900  . insulin aspart (NOVOLOG FLEXPEN) 100 UNIT/ML injection Inject 2-10 Units into the skin 3 (three) times daily before meals. Sliding scale as follows 200-250=2 units 251-300=4 units 301-350=6 units 351-400=8 units >400-give 10 units and Notify MD   11/06/2016 at Unknown time  . Insulin  Glargine (LANTUS SOLOSTAR) 100 UNIT/ML SOPN Inject 15 Units into the skin at bedtime. (Patient taking differently: Inject 20 Units into the skin at bedtime. )   11/06/2016 at Unknown time  . nitroGLYCERIN (NITROSTAT) 0.4 MG SL tablet Place 1 tablet (0.4 mg total) under the tongue every 5 (five) minutes as needed for chest pain. 25 tablet 5 unknown  . oxyCODONE-acetaminophen (PERCOCET) 10-325 MG per tablet Take 1 tablet by mouth 4 (four) times daily.   11/06/2016 at Unknown time  . promethazine (PHENERGAN) 25 MG tablet Take 25 mg by mouth every 6 (six) hours as needed for nausea or vomiting.   11/06/2016 at Unknown time   Scheduled: . apixaban  5 mg Oral BID  . insulin aspart  0-5 Units Subcutaneous QHS  . insulin aspart  0-9 Units Subcutaneous TID WC  . insulin aspart  4 Units Subcutaneous TID WC  . insulin glargine  20 Units Subcutaneous QHS  . pantoprazole  40 mg Oral BID AC   Continuous: . lactated ringers 50 mL/hr at 11/12/16 0300   RCB:[id number] **OR** acetaminophen, ALPRAZolam, ondansetron **OR** ondansetron (ZOFRAN) IV  Assesment: She was admitted with acute pancreatitis. It's been difficult to be sure what her situation is because she alternately complains that she is in 10 out of 10 pain but then says she wants to eat. She's on clear liquids now. Her pain seems to be better controlled but I think she's having confusion related to taking hydromorphone which I have discontinued. Her blood sugar has been okay. Active Problems:   Pancreatitis, acute    Plan: Discontinue hydromorphone. See how she does with clear liquid breakfast as far as pain is concerned and consider advancing her diet    LOS: 4 days   Bodi Palmeri L 11/12/2016, 8:38 AM

## 2016-11-12 NOTE — Progress Notes (Signed)
Patient refused blood sugar check and medication. Patient states " you are not taking my blood".  Patient states" I do not want anything from you". Patient currently lying in bed, has been tearful and anxious today.

## 2016-11-12 NOTE — Progress Notes (Signed)
Patient bed alarm going off.  Entered the room where the patient told another nurse that she had been yelling for and hour and someone had told her to shut up.  Nurse told the patient that she has been close by and has not heard any yelling.  Patient then stated that she was leaving and that she was not going to be treated that way.  Writer asked the patient if she didn't need her pain medication then?  Patient said she wanted pain medication.  Medication given.  Patient tearful stating that her family didn't care about her and that she had a lot going on with her daughter and her mother.  Writer explained to the patient that she needed to concentrate on getting better because she needs to be well before she can take care of anyone else.  Patient still tearful and stating that she is leaving tomorrow she didn't care what the doctor says.  Told the patient that she would have to talk to the doctor in the morning.  Gave the patient apple juice and gram crackers. Explained to the patient again where the buttons were to call for help.  Patient agreeable.  Patient currently resting in room.  Will continue to monitor.

## 2016-11-13 DIAGNOSIS — G9341 Metabolic encephalopathy: Secondary | ICD-10-CM | POA: Diagnosis not present

## 2016-11-13 LAB — GLUCOSE, CAPILLARY
Glucose-Capillary: 95 mg/dL (ref 65–99)
Glucose-Capillary: 59 mg/dL — ABNORMAL LOW (ref 65–99)

## 2016-11-13 MED ORDER — OXYCODONE-ACETAMINOPHEN 10-325 MG PO TABS: 1.0000 | ORAL_TABLET | Freq: Four times a day (QID) | ORAL | 0 refills | Status: DC

## 2016-11-13 NOTE — Plan of Care (Signed)
Problem: Safety: Goal: Ability to remain free from injury will improve Outcome: Progressing Discussed safety techniques I.e call before fall; bed alarm; bed in low position; and side rails up x2

## 2016-11-13 NOTE — Plan of Care (Signed)
Problem: Education: Goal: Knowledge of [facility] Education information/materials will improve Outcome: Progressing Discussed general education information with patient-verbalized understanding

## 2016-11-13 NOTE — Discharge Summary (Signed)
Physician Discharge Summary  Patient ID: [name] MRN: [id number] DOB/AGE: [birth year] 46 y.o. Primary Care Physician:Meko Bellanger, Percell Miller, MD Admit date: 11/06/2016 Discharge date: 11/13/2016    Discharge Diagnoses:   Active Problems:   Insulin dependent diabetes mellitus with complications (HCC)   Peripheral arterial disease (HCC)   Tobacco abuse   Thromboembolism of lower extremity artery (HCC)   History of stroke   Coronary atherosclerosis of native coronary artery   Cardiomyopathy, ischemic   Pancreatitis, acute   Metabolic encephalopathy   Allergies as of 11/13/2016      Reactions   Ibuprofen Other (See Comments)   Kidney disease   Losartan Swelling      Medication List    TAKE these medications   ALPRAZolam 1 MG tablet Commonly known as:  XANAX Take 1 mg by mouth 3 (three) times daily as needed for anxiety.   ELIQUIS 5 MG Tabs tablet Generic drug:  apixaban Take 5 mg by mouth 2 (two) times daily.   Insulin Glargine 100 UNIT/ML Solostar Pen Commonly known as:  LANTUS SOLOSTAR Inject 15 Units into the skin at bedtime. What changed:  how much to take   nitroGLYCERIN 0.4 MG SL tablet Commonly known as:  NITROSTAT Place 1 tablet (0.4 mg total) under the tongue every 5 (five) minutes as needed for chest pain.   NOVOLOG FLEXPEN 100 UNIT/ML injection Generic drug:  insulin aspart Inject 2-10 Units into the skin 3 (three) times daily before meals. Sliding scale as follows 200-250=2 units 251-300=4 units 301-350=6 units 351-400=8 units >400-give 10 units and Notify MD   oxyCODONE-acetaminophen 10-325 MG tablet Commonly known as:  PERCOCET Take 1 tablet by mouth 4 (four) times daily.   promethazine 25 MG tablet Commonly known as:  PHENERGAN Take 25 mg by mouth every 6 (six) hours as needed for nausea or vomiting.       Discharged Condition:improved    Consults:Gastroenterology Dr. Oneida Alar  Significant Diagnostic Studies: Ct Abdomen Pelvis W  Contrast  Result Date: 11/07/2016 CLINICAL DATA:  Nausea, vomiting, diarrhea, and right upper quadrant pain starting yesterday. EXAM: CT ABDOMEN AND PELVIS WITH CONTRAST TECHNIQUE: Multidetector CT imaging of the abdomen and pelvis was performed using the standard protocol following bolus administration of intravenous contrast. CONTRAST:  37mL ISOVUE-300 IOPAMIDOL (ISOVUE-300) INJECTION 61%, 129mL ISOVUE-300 IOPAMIDOL (ISOVUE-300) INJECTION 61% COMPARISON:  08/11/2010 FINDINGS: Lower chest: The lung bases are clear. Hepatobiliary: No focal liver abnormality is seen. No gallstones, gallbladder wall thickening, or biliary dilatation. Pancreas: Unremarkable. No pancreatic ductal dilatation or surrounding inflammatory changes. Spleen: Normal in size without focal abnormality. Adrenals/Urinary Tract: No adrenal gland nodules. Cyst in the upper pole right kidney. No solid renal mass lesions. Nephrograms are symmetrical. No hydronephrosis or hydroureter. Bladder wall is not thickened and no bladder filling defects are identified. Stomach/Bowel: Stomach and small bowel are mostly decompressed. Contrast material is demonstrated in the distal small bowel suggesting no evidence of obstruction. Scattered stool in the colon. Colon is mostly decompressed as well. No inflammatory bowel changes are demonstrated. The appendix is normal. Vascular/Lymphatic: Diffuse calcification of the abdominal aorta without aneurysm. Possible calcific stenosis of the iliac arteries bilaterally. The iliac arteries and external iliac arteries remain patent. Reproductive: Uterus and bilateral adnexa are unremarkable. Other: No free air or free fluid in the abdomen. No loculated collections. Abdominal wall musculature appears intact. Musculoskeletal: No acute or significant osseous findings. IMPRESSION: No acute process demonstrated in the abdomen or pelvis. No evidence of bowel obstruction or inflammation. Diffuse aortic  atherosclerosis with  possible calcific stenosis in the iliac arteries. Electronically Signed   By: Lucienne Capers M.D.   On: 11/07/2016 00:08   US Abdomen Limited  Result Date: 11/07/2016 CLINICAL DATA:  Evaluate for gallstones.  Acute pancreatitis. EXAM: ULTRASOUND ABDOMEN LIMITED RIGHT UPPER QUADRANT COMPARISON:  CT scan from earlier today FINDINGS: Gallbladder: No gallstones or wall thickening visualized. No sonographic Murphy sign noted by sonographer. Common bile duct: Diameter: 4.2 mm Liver: No focal lesion identified. Within normal limits in parenchymal echogenicity. Portal vein is patent on color Doppler imaging with normal direction of blood flow towards the liver. IMPRESSION: Normal study.  No cholelithiasis. Electronically Signed   By: Dorise Bullion III M.D   On: 11/07/2016 12:43    Lab Results: Basic Metabolic Panel: No results for input(s): NA, K, CL, CO2, GLUCOSE, BUN, CREATININE, CALCIUM, MG, PHOS in the last 72 hours. Liver Function Tests: No results for input(s): AST, ALT, ALKPHOS, BILITOT, PROT, ALBUMIN in the last 72 hours.   CBC: No results for input(s): WBC, NEUTROABS, HGB, HCT, MCV, PLT in the last 72 hours.  No results found for this or any previous visit (from the past 240 hour(s)).   Hospital Course: This is a 46 year old who came to the emergency department because of abdominal pain. She is known to have diabetes likely type I. She was found to have pancreatitis which appears to be idiopathic. She was treated with pain medications and IV fluids. She became encephalopathic and this was thought to be related to being out of her normal environment and hydromorphone. Hydromorphone was discontinued and although she still had some encephalopathy she was much improved. Her abdominal pain improved and she was ready for discharge. I think she will do better as far as her mental status if she gets back to her normal environment  Discharge Exam: Blood pressure 135/64, pulse 88, temperature 98 F  (36.7 C), temperature source Oral, resp. rate 15, height 5\' 1"  (1.549 m), weight 44.5 kg (98 lb), last menstrual period 01/12/2011, SpO2 98 %. She is awake and alert. Chest is clear. Abdomen is soft. Still with mild agitation  Disposition: Home with home health services  Discharge Instructions    Call MD for:  persistant nausea and vomiting    Complete by:  As directed    Call MD for:  severe uncontrolled pain    Complete by:  As directed    Call MD for:  temperature >100.4    Complete by:  As directed    Diet - low sodium heart healthy    Complete by:  As directed    Face-to-face encounter (required for Medicare/Medicaid patients)    Complete by:  As directed    I Yong Wahlquist L certify that this patient is under my care and that I, or a nurse practitioner or physician's assistant working with me, had a face-to-face encounter that meets the physician face-to-face encounter requirements with this patient on 11/13/2016. The encounter with the patient was in whole, or in part for the following medical condition(s) which is the primary reason for home health care (List medical condition): Acute pancreatitis   The encounter with the patient was in whole, or in part, for the following medical condition, which is the primary reason for home health care:  Acute pancreatitis   I certify that, based on my findings, the following services are medically necessary home health services:   Nursing Physical therapy     Reason for Medically [place]  Services:  Skilled Nursing- Change/Decline in Patient Status   My clinical findings support the need for the above services:  Unable to leave home safely without assistance and/or assistive device   Further, I certify that my clinical findings support that this patient is homebound due to:  Unable to leave home safely without assistance   Home Health    Complete by:  As directed    To provide the following care/treatments:   PT RN      Increase activity slowly    Complete by:  As directed         Signed: Devon Pretty L   11/13/2016, 10:06 AM

## 2016-11-13 NOTE — Progress Notes (Signed)
Subjective: She is still a little bit agitated but better than yesterday. Her blood sugar is slightly low and that may be part of the problem. She has no other new complaints. She has been able to eat.  Objective: Vital signs in last 24 hours: Temp:  [98 F (36.7 C)-98.2 F (36.8 C)] 98 F (36.7 C) (10/27 0507) Pulse Rate:  [79-88] 88 (10/27 0507) Resp:  [15] 15 (10/27 0507) BP: (135-138)/(54-64) 135/64 (10/27 0507) SpO2:  [96 %-98 %] 98 % (10/27 0507) Weight change:  Last BM Date: 11/11/16  Intake/Output from previous day: 10/26 0701 - 10/27 0700 In: 100 [P.O.:100] Out: -   PHYSICAL EXAM General appearance: alert and mild distress Resp: clear to auscultation bilaterally Cardio: regular rate and rhythm, S1, S2 normal, no murmur, click, rub or gallop GI: soft, non-tender; bowel sounds normal; no masses,  no organomegaly Extremities: She has had left below-the-knee amputation Skin warm and dry  Lab Results:  Results for orders placed or performed during the hospital encounter of 11/06/16 (from the past 48 hour(s))  Glucose, capillary     Status: Abnormal   Collection Time: 11/11/16 11:19 AM  Result Value Ref Range   Glucose-Capillary 250 (H) 65 - 99 mg/dL  Glucose, capillary     Status: Abnormal   Collection Time: 11/11/16  4:45 PM  Result Value Ref Range   Glucose-Capillary 45 (L) 65 - 99 mg/dL  Glucose, capillary     Status: None   Collection Time: 11/11/16  5:24 PM  Result Value Ref Range   Glucose-Capillary 88 65 - 99 mg/dL  Glucose, capillary     Status: Abnormal   Collection Time: 11/11/16  8:57 PM  Result Value Ref Range   Glucose-Capillary 163 (H) 65 - 99 mg/dL   Comment 1 Notify RN    Comment 2 Document in Chart   Glucose, capillary     Status: Abnormal   Collection Time: 11/12/16  8:07 AM  Result Value Ref Range   Glucose-Capillary 139 (H) 65 - 99 mg/dL   Comment 1 Call MD NNP PA CNM   Glucose, capillary     Status: Abnormal   Collection Time: 11/12/16  11:34 AM  Result Value Ref Range   Glucose-Capillary 107 (H) 65 - 99 mg/dL  Glucose, capillary     Status: Abnormal   Collection Time: 11/12/16  9:02 PM  Result Value Ref Range   Glucose-Capillary 295 (H) 65 - 99 mg/dL   Comment 1 Notify RN    Comment 2 Document in Chart   Glucose, capillary     Status: None   Collection Time: 11/13/16  7:35 AM  Result Value Ref Range   Glucose-Capillary 95 65 - 99 mg/dL  Glucose, capillary     Status: Abnormal   Collection Time: 11/13/16  9:03 AM  Result Value Ref Range   Glucose-Capillary 59 (L) 65 - 99 mg/dL    ABGS No results for input(s): PHART, PO2ART, TCO2, HCO3 in the last 72 hours.  Invalid input(s): PCO2 CULTURES No results found for this or any previous visit (from the past 240 hour(s)). Studies/Results: No results found.  Medications:  Prior to Admission:  Prescriptions Prior to Admission  Medication Sig Dispense Refill Last Dose  . ALPRAZolam (XANAX) 1 MG tablet Take 1 mg by mouth 3 (three) times daily as needed for anxiety.   Past Week at Unknown time  . apixaban (ELIQUIS) 5 MG TABS tablet Take 5 mg by mouth 2 (two) times daily.  11/07/2016 at 0900  . insulin aspart (NOVOLOG FLEXPEN) 100 UNIT/ML injection Inject 2-10 Units into the skin 3 (three) times daily before meals. Sliding scale as follows 200-250=2 units 251-300=4 units 301-350=6 units 351-400=8 units >400-give 10 units and Notify MD   11/06/2016 at Unknown time  . Insulin Glargine (LANTUS SOLOSTAR) 100 UNIT/ML SOPN Inject 15 Units into the skin at bedtime. (Patient taking differently: Inject 20 Units into the skin at bedtime. )   11/06/2016 at Unknown time  . nitroGLYCERIN (NITROSTAT) 0.4 MG SL tablet Place 1 tablet (0.4 mg total) under the tongue every 5 (five) minutes as needed for chest pain. 25 tablet 5 unknown  . oxyCODONE-acetaminophen (PERCOCET) 10-325 MG per tablet Take 1 tablet by mouth 4 (four) times daily.   11/06/2016 at Unknown time  . promethazine  (PHENERGAN) 25 MG tablet Take 25 mg by mouth every 6 (six) hours as needed for nausea or vomiting.   11/06/2016 at Unknown time   Scheduled: . apixaban  5 mg Oral BID  . insulin aspart  0-5 Units Subcutaneous QHS  . insulin aspart  0-9 Units Subcutaneous TID WC  . insulin aspart  4 Units Subcutaneous TID WC  . insulin glargine  20 Units Subcutaneous QHS  . pantoprazole  40 mg Oral BID AC   Continuous: . lactated ringers 50 mL/hr at 11/12/16 0300   GYJ:EHUDJSHFWYOVZ **OR** acetaminophen, ALPRAZolam, ondansetron **OR** ondansetron (ZOFRAN) IV, oxyCODONE-acetaminophen **AND** oxyCODONE  Assesment: She was admitted with acute pancreatitis. She's had delirium I think probably from pain medication and although she is only 46 years old I think from being out of her normal environment. She's better and I think she will get better at home.  She has diabetes and she didn't eat very well this morning so her blood sugars a little bit low. She's going to drink some orange juice.  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 Active Problems:   Pancreatitis, acute    Plan: Discharge home today with home health services    LOS: 5 days   Aishia Barkey L 11/13/2016, 9:46 AM

## 2016-11-15 ENCOUNTER — Encounter: Payer: Self-pay | Admitting: *Deleted

## 2016-11-15 NOTE — Telephone Encounter (Signed)
Patient is scheduled for MRCP 12/14/16 @4pm  [facility]. NPO 4 hours prior to test.  I called patient and made aware of appt details. I have also mailed letter to patient with this information

## 2016-11-17 ENCOUNTER — Inpatient Hospital Stay (HOSPITAL_COMMUNITY)
Admission: EM | Admit: 2016-11-17 | Discharge: 2016-11-24 | DRG: 637 | Disposition: A | Discharge status: Skilled Nursing Facility | Payer: Medicaid Other | Attending: Internal Medicine | Admitting: Internal Medicine

## 2016-11-17 ENCOUNTER — Emergency Department (HOSPITAL_COMMUNITY): Payer: Medicaid Other

## 2016-11-17 ENCOUNTER — Encounter (HOSPITAL_COMMUNITY): Payer: Self-pay | Admitting: Neurology

## 2016-11-17 DIAGNOSIS — Z89512 Acquired absence of left leg below knee: Secondary | ICD-10-CM

## 2016-11-17 DIAGNOSIS — E1010 Type 1 diabetes mellitus with ketoacidosis without coma: Secondary | ICD-10-CM

## 2016-11-17 DIAGNOSIS — G9340 Encephalopathy, unspecified: Secondary | ICD-10-CM | POA: Diagnosis not present

## 2016-11-17 DIAGNOSIS — D6489 Other specified anemias: Secondary | ICD-10-CM | POA: Diagnosis present

## 2016-11-17 DIAGNOSIS — I6310 Cerebral infarction due to embolism of unspecified precerebral artery: Secondary | ICD-10-CM | POA: Diagnosis not present

## 2016-11-17 DIAGNOSIS — J9601 Acute respiratory failure with hypoxia: Secondary | ICD-10-CM

## 2016-11-17 DIAGNOSIS — I429 Cardiomyopathy, unspecified: Secondary | ICD-10-CM | POA: Diagnosis not present

## 2016-11-17 DIAGNOSIS — N3000 Acute cystitis without hematuria: Secondary | ICD-10-CM

## 2016-11-17 DIAGNOSIS — I110 Hypertensive heart disease with heart failure: Secondary | ICD-10-CM | POA: Diagnosis present

## 2016-11-17 DIAGNOSIS — Z823 Family history of stroke: Secondary | ICD-10-CM

## 2016-11-17 DIAGNOSIS — D6859 Other primary thrombophilia: Secondary | ICD-10-CM | POA: Diagnosis present

## 2016-11-17 DIAGNOSIS — E876 Hypokalemia: Secondary | ICD-10-CM | POA: Diagnosis not present

## 2016-11-17 DIAGNOSIS — I340 Nonrheumatic mitral (valve) insufficiency: Secondary | ICD-10-CM | POA: Diagnosis not present

## 2016-11-17 DIAGNOSIS — I255 Ischemic cardiomyopathy: Secondary | ICD-10-CM | POA: Diagnosis present

## 2016-11-17 DIAGNOSIS — Z681 Body mass index (BMI) 19 or less, adult: Secondary | ICD-10-CM

## 2016-11-17 DIAGNOSIS — R1310 Dysphagia, unspecified: Secondary | ICD-10-CM | POA: Diagnosis not present

## 2016-11-17 DIAGNOSIS — N170 Acute kidney failure with tubular necrosis: Secondary | ICD-10-CM | POA: Diagnosis present

## 2016-11-17 DIAGNOSIS — B9689 Other specified bacterial agents as the cause of diseases classified elsewhere: Secondary | ICD-10-CM | POA: Diagnosis present

## 2016-11-17 DIAGNOSIS — I6340 Cerebral infarction due to embolism of unspecified cerebral artery: Secondary | ICD-10-CM | POA: Insufficient documentation

## 2016-11-17 DIAGNOSIS — E1051 Type 1 diabetes mellitus with diabetic peripheral angiopathy without gangrene: Secondary | ICD-10-CM | POA: Diagnosis present

## 2016-11-17 DIAGNOSIS — R739 Hyperglycemia, unspecified: Secondary | ICD-10-CM | POA: Diagnosis not present

## 2016-11-17 DIAGNOSIS — Z9111 Patient's noncompliance with dietary regimen: Secondary | ICD-10-CM

## 2016-11-17 DIAGNOSIS — I63132 Cerebral infarction due to embolism of left carotid artery: Secondary | ICD-10-CM | POA: Diagnosis not present

## 2016-11-17 DIAGNOSIS — D631 Anemia in chronic kidney disease: Secondary | ICD-10-CM | POA: Diagnosis present

## 2016-11-17 DIAGNOSIS — G8321 Monoplegia of upper limb affecting right dominant side: Secondary | ICD-10-CM | POA: Diagnosis present

## 2016-11-17 DIAGNOSIS — Z515 Encounter for palliative care: Secondary | ICD-10-CM | POA: Diagnosis not present

## 2016-11-17 DIAGNOSIS — E1011 Type 1 diabetes mellitus with ketoacidosis with coma: Secondary | ICD-10-CM | POA: Diagnosis present

## 2016-11-17 DIAGNOSIS — F17210 Nicotine dependence, cigarettes, uncomplicated: Secondary | ICD-10-CM | POA: Diagnosis present

## 2016-11-17 DIAGNOSIS — E1022 Type 1 diabetes mellitus with diabetic chronic kidney disease: Secondary | ICD-10-CM | POA: Diagnosis present

## 2016-11-17 DIAGNOSIS — Z833 Family history of diabetes mellitus: Secondary | ICD-10-CM

## 2016-11-17 DIAGNOSIS — I2510 Atherosclerotic heart disease of native coronary artery without angina pectoris: Secondary | ICD-10-CM | POA: Diagnosis present

## 2016-11-17 DIAGNOSIS — I63512 Cerebral infarction due to unspecified occlusion or stenosis of left middle cerebral artery: Secondary | ICD-10-CM | POA: Diagnosis present

## 2016-11-17 DIAGNOSIS — N179 Acute kidney failure, unspecified: Secondary | ICD-10-CM | POA: Diagnosis not present

## 2016-11-17 DIAGNOSIS — T383X6A Underdosing of insulin and oral hypoglycemic [antidiabetic] drugs, initial encounter: Secondary | ICD-10-CM | POA: Diagnosis present

## 2016-11-17 DIAGNOSIS — I6522 Occlusion and stenosis of left carotid artery: Secondary | ICD-10-CM

## 2016-11-17 DIAGNOSIS — R4701 Aphasia: Secondary | ICD-10-CM | POA: Diagnosis present

## 2016-11-17 DIAGNOSIS — Z8673 Personal history of transient ischemic attack (TIA), and cerebral infarction without residual deficits: Secondary | ICD-10-CM

## 2016-11-17 DIAGNOSIS — I639 Cerebral infarction, unspecified: Secondary | ICD-10-CM | POA: Diagnosis not present

## 2016-11-17 DIAGNOSIS — Z7901 Long term (current) use of anticoagulants: Secondary | ICD-10-CM

## 2016-11-17 DIAGNOSIS — R29707 NIHSS score 7: Secondary | ICD-10-CM | POA: Diagnosis present

## 2016-11-17 DIAGNOSIS — Z8249 Family history of ischemic heart disease and other diseases of the circulatory system: Secondary | ICD-10-CM

## 2016-11-17 DIAGNOSIS — E43 Unspecified severe protein-calorie malnutrition: Secondary | ICD-10-CM | POA: Diagnosis present

## 2016-11-17 DIAGNOSIS — E875 Hyperkalemia: Secondary | ICD-10-CM | POA: Diagnosis present

## 2016-11-17 DIAGNOSIS — Z794 Long term (current) use of insulin: Secondary | ICD-10-CM

## 2016-11-17 DIAGNOSIS — T68XXXA Hypothermia, initial encounter: Secondary | ICD-10-CM | POA: Diagnosis present

## 2016-11-17 DIAGNOSIS — G546 Phantom limb syndrome with pain: Secondary | ICD-10-CM | POA: Diagnosis present

## 2016-11-17 DIAGNOSIS — E785 Hyperlipidemia, unspecified: Secondary | ICD-10-CM | POA: Diagnosis present

## 2016-11-17 DIAGNOSIS — G8929 Other chronic pain: Secondary | ICD-10-CM | POA: Diagnosis present

## 2016-11-17 DIAGNOSIS — J9690 Respiratory failure, unspecified, unspecified whether with hypoxia or hypercapnia: Secondary | ICD-10-CM

## 2016-11-17 DIAGNOSIS — E1042 Type 1 diabetes mellitus with diabetic polyneuropathy: Secondary | ICD-10-CM | POA: Diagnosis present

## 2016-11-17 DIAGNOSIS — G9341 Metabolic encephalopathy: Secondary | ICD-10-CM | POA: Diagnosis present

## 2016-11-17 DIAGNOSIS — Z86718 Personal history of other venous thrombosis and embolism: Secondary | ICD-10-CM

## 2016-11-17 DIAGNOSIS — Z888 Allergy status to other drugs, medicaments and biological substances status: Secondary | ICD-10-CM

## 2016-11-17 DIAGNOSIS — I252 Old myocardial infarction: Secondary | ICD-10-CM

## 2016-11-17 DIAGNOSIS — F419 Anxiety disorder, unspecified: Secondary | ICD-10-CM | POA: Diagnosis present

## 2016-11-17 DIAGNOSIS — R4182 Altered mental status, unspecified: Secondary | ICD-10-CM | POA: Diagnosis present

## 2016-11-17 DIAGNOSIS — I5021 Acute systolic (congestive) heart failure: Secondary | ICD-10-CM | POA: Diagnosis not present

## 2016-11-17 DIAGNOSIS — Z9289 Personal history of other medical treatment: Secondary | ICD-10-CM

## 2016-11-17 DIAGNOSIS — L89611 Pressure ulcer of right heel, stage 1: Secondary | ICD-10-CM

## 2016-11-17 DIAGNOSIS — L8990 Pressure ulcer of unspecified site, unspecified stage: Secondary | ICD-10-CM

## 2016-11-17 DIAGNOSIS — Z9114 Patient's other noncompliance with medication regimen: Secondary | ICD-10-CM

## 2016-11-17 DIAGNOSIS — Z8719 Personal history of other diseases of the digestive system: Secondary | ICD-10-CM

## 2016-11-17 DIAGNOSIS — Z79891 Long term (current) use of opiate analgesic: Secondary | ICD-10-CM

## 2016-11-17 DIAGNOSIS — R251 Tremor, unspecified: Secondary | ICD-10-CM | POA: Diagnosis present

## 2016-11-17 DIAGNOSIS — Z9119 Patient's noncompliance with other medical treatment and regimen: Secondary | ICD-10-CM

## 2016-11-17 DIAGNOSIS — Z955 Presence of coronary angioplasty implant and graft: Secondary | ICD-10-CM

## 2016-11-17 HISTORY — DX: Type 1 diabetes mellitus with ketoacidosis without coma: E10.10

## 2016-11-17 LAB — BASIC METABOLIC PANEL
Anion gap: 14 (ref 5–15)
BUN: 36 mg/dL — ABNORMAL HIGH (ref 6–20)
BUN: 34 mg/dL — ABNORMAL HIGH (ref 6–20)
CO2: <7 mmol/L — ABNORMAL LOW (ref 22–32)
CO2: 14 mmol/L — ABNORMAL LOW (ref 22–32)
Calcium: 7.4 mg/dL — ABNORMAL LOW (ref 8.9–10.3)
Calcium: 7.8 mg/dL — ABNORMAL LOW (ref 8.9–10.3)
Chloride: 111 mmol/L (ref 101–111)
Chloride: 116 mmol/L — ABNORMAL HIGH (ref 101–111)
Creatinine, Ser: 2.48 mg/dL — ABNORMAL HIGH (ref 0.44–1.00)
Creatinine, Ser: 2.2 mg/dL — ABNORMAL HIGH (ref 0.44–1.00)
GFR calc Af Amer: 26 mL/min — ABNORMAL LOW (ref >60)
GFR calc Af Amer: 29 mL/min — ABNORMAL LOW (ref >60)
GFR calc non Af Amer: 22 mL/min — ABNORMAL LOW (ref >60)
GFR calc non Af Amer: 25 mL/min — ABNORMAL LOW (ref >60)
Glucose, Bld: 805 mg/dL (ref 65–99)
Glucose, Bld: 500 mg/dL — ABNORMAL HIGH (ref 65–99)
Potassium: 3.1 mmol/L — ABNORMAL LOW (ref 3.5–5.1)
Potassium: 2.8 mmol/L — ABNORMAL LOW (ref 3.5–5.1)
Sodium: 139 mmol/L (ref 135–145)
Sodium: 144 mmol/L (ref 135–145)

## 2016-11-17 LAB — CBG MONITORING, ED
Glucose-Capillary: >600 mg/dL (ref 65–99)
Glucose-Capillary: >600 mg/dL (ref 65–99)
Glucose-Capillary: >600 mg/dL (ref 65–99)
Glucose-Capillary: >600 mg/dL (ref 65–99)
Glucose-Capillary: >600 mg/dL (ref 65–99)

## 2016-11-17 LAB — URINALYSIS, ROUTINE W REFLEX MICROSCOPIC
Bilirubin Urine: NEGATIVE
Glucose, UA: >=500 mg/dL — AB
Ketones, ur: 20 mg/dL — AB
Leukocytes, UA: NEGATIVE
Nitrite: NEGATIVE
Protein, ur: 100 mg/dL — AB
Specific Gravity, Urine: 1.02 (ref 1.005–1.030)
Squamous Epithelial / LPF: NONE SEEN
pH: 5 (ref 5.0–8.0)

## 2016-11-17 LAB — BLOOD GAS, ARTERIAL
Acid-base deficit: 28.1 mmol/L — ABNORMAL HIGH (ref 0.0–2.0)
Acid-base deficit: 25.2 mmol/L — ABNORMAL HIGH (ref 0.0–2.0)
Bicarbonate: 2.3 mmol/L — ABNORMAL LOW (ref 20.0–28.0)
Bicarbonate: 3.3 mmol/L — ABNORMAL LOW (ref 20.0–28.0)
Drawn by: 441371
Drawn by: 441371
FIO2: 21
FIO2: 21
O2 Saturation: 97.4 %
O2 Saturation: 97.3 %
Patient temperature: 86.2
Patient temperature: 95.1
pH, Arterial: 6.965 — CL (ref 7.350–7.450)
pH, Arterial: 7.122 — CL (ref 7.350–7.450)
pO2, Arterial: 156 mmHg — ABNORMAL HIGH (ref 83.0–108.0)
pO2, Arterial: 113 mmHg — ABNORMAL HIGH (ref 83.0–108.0)

## 2016-11-17 LAB — GLUCOSE, CAPILLARY
Glucose-Capillary: 280 mg/dL — ABNORMAL HIGH (ref 65–99)
Glucose-Capillary: 373 mg/dL — ABNORMAL HIGH (ref 65–99)
Glucose-Capillary: 437 mg/dL — ABNORMAL HIGH (ref 65–99)
Glucose-Capillary: 519 mg/dL (ref 65–99)
Glucose-Capillary: 570 mg/dL (ref 65–99)
Glucose-Capillary: >600 mg/dL (ref 65–99)

## 2016-11-17 LAB — COMPREHENSIVE METABOLIC PANEL
ALT: 12 U/L — ABNORMAL LOW (ref 14–54)
AST: 45 U/L — ABNORMAL HIGH (ref 15–41)
Albumin: 2 g/dL — ABNORMAL LOW (ref 3.5–5.0)
Alkaline Phosphatase: 110 U/L (ref 38–126)
BUN: 40 mg/dL — ABNORMAL HIGH (ref 6–20)
CO2: <7 mmol/L — ABNORMAL LOW (ref 22–32)
Calcium: 8.3 mg/dL — ABNORMAL LOW (ref 8.9–10.3)
Chloride: 96 mmol/L — ABNORMAL LOW (ref 101–111)
Creatinine, Ser: 2.76 mg/dL — ABNORMAL HIGH (ref 0.44–1.00)
GFR calc Af Amer: 22 mL/min — ABNORMAL LOW (ref >60)
GFR calc non Af Amer: 19 mL/min — ABNORMAL LOW (ref >60)
Glucose, Bld: 1429 mg/dL (ref 65–99)
Potassium: 6.3 mmol/L (ref 3.5–5.1)
Sodium: 131 mmol/L — ABNORMAL LOW (ref 135–145)
Total Bilirubin: 1.8 mg/dL — ABNORMAL HIGH (ref 0.3–1.2)
Total Protein: 5.2 g/dL — ABNORMAL LOW (ref 6.5–8.1)

## 2016-11-17 LAB — PHOSPHORUS: Phosphorus: 3.3 mg/dL (ref 2.5–4.6)

## 2016-11-17 LAB — CBC
HCT: 33.2 % — ABNORMAL LOW (ref 36.0–46.0)
Hemoglobin: 9.2 g/dL — ABNORMAL LOW (ref 12.0–15.0)
MCH: 28.1 pg (ref 26.0–34.0)
MCHC: 27.7 g/dL — ABNORMAL LOW (ref 30.0–36.0)
MCV: 101.5 fL — ABNORMAL HIGH (ref 78.0–100.0)
Platelets: 630 10*3/uL — ABNORMAL HIGH (ref 150–400)
RBC: 3.27 MIL/uL — ABNORMAL LOW (ref 3.87–5.11)
RDW: 14.7 % (ref 11.5–15.5)
WBC: 24.3 10*3/uL — ABNORMAL HIGH (ref 4.0–10.5)

## 2016-11-17 LAB — I-STAT BETA HCG BLOOD, ED (MC, WL, AP ONLY): I-stat hCG, quantitative: <5 m[IU]/mL (ref <5)

## 2016-11-17 LAB — ETHANOL: Alcohol, Ethyl (B): <10 mg/dL (ref <10)

## 2016-11-17 LAB — LIPASE, BLOOD: Lipase: 78 U/L — ABNORMAL HIGH (ref 11–51)

## 2016-11-17 LAB — I-STAT CG4 LACTIC ACID, ED: Lactic Acid, Venous: 2.93 mmol/L (ref 0.5–1.9)

## 2016-11-17 LAB — MRSA PCR SCREENING: MRSA by PCR: NEGATIVE

## 2016-11-17 LAB — CG4 I-STAT (LACTIC ACID): Lactic Acid, Venous: 2.4 mmol/L (ref 0.5–1.9)

## 2016-11-17 LAB — MAGNESIUM: Magnesium: 2.2 mg/dL (ref 1.7–2.4)

## 2016-11-17 MED ORDER — DEXTROSE-NACL 5-0.45 % IV SOLN: INTRAVENOUS | Status: DC

## 2016-11-17 MED ORDER — SODIUM CHLORIDE 0.9 % IV BOLUS (SEPSIS)
1000.0000 mL | Freq: Once | INTRAVENOUS | Status: AC
  Administered 2016-11-17: 1000 mL via INTRAVENOUS

## 2016-11-17 MED ORDER — POTASSIUM CHLORIDE 10 MEQ/100ML IV SOLN
10.0000 meq | INTRAVENOUS | Status: AC
  Administered 2016-11-18 (×2): 10 meq via INTRAVENOUS
  Filled 2016-11-17 (×4): qty 100

## 2016-11-17 MED ORDER — POTASSIUM CHLORIDE 10 MEQ/100ML IV SOLN
10.0000 meq | INTRAVENOUS | Status: AC
  Administered 2016-11-17 (×2): 10 meq via INTRAVENOUS
  Filled 2016-11-17 (×3): qty 100

## 2016-11-17 MED ORDER — SODIUM CHLORIDE 0.9 % IV SOLN
INTRAVENOUS | Status: DC
  Administered 2016-11-17: 21.6 [IU]/h via INTRAVENOUS
  Administered 2016-11-18: 1.8 [IU]/h via INTRAVENOUS
  Filled 2016-11-17 (×3): qty 1

## 2016-11-17 MED ORDER — SODIUM CHLORIDE 0.9 % IV SOLN
INTRAVENOUS | Status: DC
  Administered 2016-11-17: 5.4 [IU]/h via INTRAVENOUS
  Filled 2016-11-17: qty 1

## 2016-11-17 MED ORDER — INSULIN ASPART 100 UNIT/ML ~~LOC~~ SOLN
10.0000 [IU] | Freq: Once | SUBCUTANEOUS | Status: AC
  Administered 2016-11-17: 10 [IU] via INTRAVENOUS
  Filled 2016-11-17: qty 1

## 2016-11-17 MED ORDER — ONDANSETRON HCL 4 MG/2ML IJ SOLN
4.0000 mg | Freq: Four times a day (QID) | INTRAMUSCULAR | Status: DC | PRN
  Administered 2016-11-18: 4 mg via INTRAVENOUS
  Filled 2016-11-17: qty 2

## 2016-11-17 MED ORDER — SODIUM CHLORIDE 0.9 % IV SOLN
INTRAVENOUS | Status: DC
  Administered 2016-11-17: 17:00:00 via INTRAVENOUS
  Administered 2016-11-17: 150 mL/h via INTRAVENOUS

## 2016-11-17 MED ORDER — HEPARIN SODIUM (PORCINE) 5000 UNIT/ML IJ SOLN
5000.0000 [IU] | Freq: Three times a day (TID) | INTRAMUSCULAR | Status: DC
  Administered 2016-11-18 – 2016-11-24 (×17): 5000 [IU] via SUBCUTANEOUS
  Filled 2016-11-17 (×23): qty 1

## 2016-11-17 MED ORDER — ORAL CARE MOUTH RINSE
15.0000 mL | Freq: Two times a day (BID) | OROMUCOSAL | Status: DC
  Administered 2016-11-18 – 2016-11-20 (×5): 15 mL via OROMUCOSAL

## 2016-11-17 NOTE — ED Notes (Signed)
K 6.3, Glucose 1429, CO2 less than 7. Dr Venora Maples Notified.

## 2016-11-17 NOTE — ED Notes (Signed)
Pt still not responding or following commands. Pt has eyes open and is moving her left arm.

## 2016-11-17 NOTE — Progress Notes (Signed)
Critical ABG values given to Dr. Venora Maples.

## 2016-11-17 NOTE — Progress Notes (Signed)
[place] Progress Note Patient Name: [name] DOB: 04-10-1970 MRN: [id number]   Date of Service  11/17/2016  HPI/Events of Note  Contacted by bedside nurse regarding potassium 2.8. Patient still receiving second run of IV potassium chloride. Not oriented enough to tolerate oral intake. Creatinine 2.2. Has peripheral IV access. Glucose 373. Continuing on insulin drip. Metabolic acidosis improving with serum bicarbonate of 14. Anion gap 14.   eICU Interventions  1. Completing earlier potassium chloride order 2. Adding additional 2 runs of KCl 10 mEq IV 3. Continuing to monitor electrolytes with BMP every 4 hours 4. Continuing insulin drip and fluids as per DKA protocol      Intervention Category Major Interventions: Electrolyte abnormality - evaluation and management;Acid-Base disturbance - evaluation and management  Tera Partridge 11/17/2016, 10:39 PM

## 2016-11-17 NOTE — ED Triage Notes (Signed)
Per ems- pt comes from home, family reported unresponsive this morning, unsure exactly when LSN. CBG reading "high", ETC02 8, RR 40, BP 80/44, 22 R. AC. Has left AKA, has diabetes. Pt is responsive to pain.

## 2016-11-17 NOTE — Progress Notes (Signed)
e[facility] Physician-Brief Progress Note Patient Name: [name] DOB: Apr 07, 1970 MRN: [id number]   Date of Service  11/17/2016  HPI/Events of Note  Patient admitted with DKA. Hyperkalemia on arrival with acute renal failure. Hyperkalemia has resolved with recent basic metabolic panel and now potassium 3.1. Creatinine still 2.4. Phosphorus 3.3 & magnesium 2.2. Has peripheral IV access. Currently on insulin drip. Bicarbonate still undetectable.   eICU Interventions  1. Continuing insulin drip and DKA protocol 2. KCl 10 mEq IV 4 runs 3. Continuing to trend electrolytes every 4 hours      Intervention Category Major Interventions: Acid-Base disturbance - evaluation and management;Electrolyte abnormality - evaluation and management  Tera Partridge 11/17/2016, 7:26 PM

## 2016-11-17 NOTE — H&P (Signed)
PULMONARY / CRITICAL CARE MEDICINE   Name: [name] MRN: [id number] DOB: 08/24/1970    ADMISSION DATE:  11/17/2016  REFERRING MD:  Dr. Venora Maples, ER  CHIEF COMPLAINT:  Altered mental status  HISTORY OF PRESENT ILLNESS:   Hx from ER staff, chart, and family.  46 yo female at [facility] from 10/20 to 10/27 with acute pancreatitis and delirium.  No clear cause of her pancreatitis was found.  After discharge home, the family reports that Ms. Loftin was not compliant with her diabetes regimen.  She didn't have a means to test her blood sugars, and would sporadically use her insulin.  She became progressively more short of breath and nauseous over the past 2 days.  She was also getting dry heaves.  She started getting more confused.  She was brought to ER and found to have severe metabolic acidosis with hyperglycemia consistent with DKA.  PAST MEDICAL HISTORY :  She  has a past medical history of Anxiety; Arterial thromboembolism ([place]); Coronary atherosclerosis of native coronary artery; Depression; Facial cellulitis; Glomerulonephritis; Headache(784.0); History of stroke; Hyperlipidemia; Insulin dependent diabetes mellitus ([place]); Ischemic cardiomyopathy; Peripheral arterial disease ([place]); Shingles; and ST elevation myocardial infarction (STEMI) of anterior wall ([place]).  PAST SURGICAL HISTORY: She  has a past surgical history that includes Wrist surgery; Embolectomy (01/29/2011); Dilation and curettage of uterus; Multiple tooth extractions; Amputation (03/03/2011); TEE without cardioversion (04/15/2011); Amputation (04/22/2011); lower extremity angiogram (N/A, 01/27/2011); lower extremity angiogram (N/A, 01/28/2011); lower extremity angiogram (Left, 01/29/2011); left heart catheterization with coronary angiogram (N/A, 10/01/2012); and Incision and drainage abscess (N/A, 04/06/2016).  Allergies  Allergen Reactions  . Ibuprofen Other (See Comments)    Kidney disease  . Losartan Swelling    No current  facility-administered medications on file prior to encounter.    Current Outpatient Prescriptions on File Prior to Encounter  Medication Sig  . ALPRAZolam (XANAX) 1 MG tablet Take 1 mg by mouth 3 (three) times daily as needed for anxiety.  Marland Kitchen apixaban (ELIQUIS) 5 MG TABS tablet Take 5 mg by mouth 2 (two) times daily.  . insulin aspart (NOVOLOG FLEXPEN) 100 UNIT/ML injection Inject 2-10 Units into the skin 3 (three) times daily before meals. Sliding scale as follows 200-250=2 units 251-300=4 units 301-350=6 units 351-400=8 units >400-give 10 units and Notify MD  . Insulin Glargine (LANTUS SOLOSTAR) 100 UNIT/ML SOPN Inject 15 Units into the skin at bedtime. (Patient taking differently: Inject 20 Units into the skin at bedtime. )  . nitroGLYCERIN (NITROSTAT) 0.4 MG SL tablet Place 1 tablet (0.4 mg total) under the tongue every 5 (five) minutes as needed for chest pain.  Marland Kitchen oxyCODONE-acetaminophen (PERCOCET) 10-325 MG tablet Take 1 tablet by mouth 4 (four) times daily.  . promethazine (PHENERGAN) 25 MG tablet Take 25 mg by mouth every 6 (six) hours as needed for nausea or vomiting.  . [DISCONTINUED] losartan (COZAAR) 25 MG tablet Take 12.5 mg by mouth daily.    FAMILY HISTORY:  Her indicated that the status of her mother is unknown. She indicated that the status of her father is unknown. She indicated that the status of her neg hx is unknown.    SOCIAL HISTORY: She  reports that she has been smoking Cigarettes.  She started smoking about 27 years ago. She has a 10.50 pack-year smoking history. She has never used smokeless tobacco. She reports that she uses drugs, including Marijuana, about 1 time per week. She reports that she does not drink alcohol.  REVIEW OF SYSTEMS:  Unable to obtain  VITAL SIGNS: BP (!) 98/47   Pulse (!) 111   Temp (!) 97.2 F (36.2 C)   Resp (!) 31   Ht 5\' 1"  (1.549 m)   Wt 100 lb (45.4 kg)   LMP 01/12/2011   SpO2 100%   BMI 18.89 kg/m   INTAKE /  OUTPUT: No intake/output data recorded.  PHYSICAL EXAMINATION:  General - somnolent Eyes - pupils reactive ENT - dry mucosa, no LAN Cardiac - regular, tachycardic, no murmur Chest - no wheeze, rales, rapid respiratory rate Abd - soft, non tender Ext - no edema, Lt BKA Skin - no rashes Neuro - moves all extremities, opens eyes with stimulation, no following commands   LABS:  BMET  Recent Labs Lab 11/17/16 1103  NA 131*  K 6.3*  CL 96*  CO2 <7*  BUN 40*  CREATININE 2.76*  GLUCOSE 1,429*    Electrolytes  Recent Labs Lab 11/17/16 1103  CALCIUM 8.3*    CBC  Recent Labs Lab 11/17/16 1103  WBC 24.3*  HGB 9.2*  HCT 33.2*  PLT 630*    Coag's No results for input(s): APTT, INR in the last 168 hours.  Sepsis Markers  Recent Labs Lab 11/17/16 1316  LATICACIDVEN 2.93*    ABG  Recent Labs Lab 11/17/16 1100 11/17/16 1530  PHART 6.965* 7.122*  PCO2ART BELOW REPORTABLE RANGE BELOW REPORTABLE RANGE  PO2ART 156* 113*    Liver Enzymes  Recent Labs Lab 11/17/16 1103  AST 45*  ALT 12*  ALKPHOS 110  BILITOT 1.8*  ALBUMIN 2.0*    Cardiac Enzymes No results for input(s): TROPONINI, PROBNP in the last 168 hours.  Glucose  Recent Labs Lab 11/13/16 0903 11/17/16 1057 11/17/16 1259 11/17/16 1331 11/17/16 1505 11/17/16 1610  GLUCAP 59* >600* >600* >600* >600* >600*    Imaging Ct Head Wo Contrast  Result Date: 11/17/2016 CLINICAL DATA:  Altered level of consciousness. EXAM: CT HEAD WITHOUT CONTRAST TECHNIQUE: Contiguous axial images were obtained from the base of the skull through the vertex without intravenous contrast. COMPARISON:  09/30/2012 FINDINGS: Brain: Old infarcts in the right frontal lobe. Low-density noted in the left parietal lobe is new since 2014 compatible with age-indeterminate infarct. Chronic microvascular disease in the deep white matter. No hydrocephalus. Mild age advanced cerebral atrophy. Vascular: No hyperdense vessel  or unexpected calcification. Skull: No acute calvarial abnormality. Sinuses/Orbits: Visualized paranasal sinuses and mastoids clear. Orbital soft tissues unremarkable. Other: None IMPRESSION: Old right frontal infarct. New low-density scratched at low-density area in the left parietal lobe compatible with infarct, age indeterminate, new since 2014. Atrophy, chronic microvascular disease. Electronically Signed   By: Rolm Baptise M.D.   On: 11/17/2016 12:13   Dg Chest Portable 1 View  Result Date: 11/17/2016 CLINICAL DATA:  Unresponsive.  Hyperglycemia. EXAM: PORTABLE CHEST 1 VIEW COMPARISON:  January 07, 2013 FINDINGS: Lungs are clear. Heart size and pulmonary vascularity are normal. No adenopathy. There is an apparent coronary stent on the left. No evident bone lesions. No pneumothorax. IMPRESSION: No edema or consolidation. Electronically Signed   By: Lowella Grip III M.D.   On: 11/17/2016 11:10     STUDIES:  CT head 10/31 >> Old Rt frontal infarct, Age indeterminate infarct Lt parietal lobe, chronic microvascular disease, cerebral atrophy  SIGNIFICANT EVENTS: 10/31 Admit  DISCUSSION: 46 yo female with DKA with coma likely related to medical non compliance.  ASSESSMENT / PLAN:  DKA. - admit to ICU - IV insulin gtt until anion  gap corrected - continue IV fluids with normal saline >> change to D5 1/2 once blood sugar < 250  Hyperkalemia in setting of DKA. - monitor heart rhythm - f/u BMET q4h - f/u magnesium, and phosphorus level - might need electrolyte replacement once DKA improves  Acute renal failure with ATN >> baseline creatinine 0.72 from 11/08/16. - continue volume resuscitation  Acute metabolic encephalopathy. Hx of CVA, anxiety, chronic pain, peripheral neuropathy. - monitor mental status while correcting DKA - hold outpt xanax, percocet, neurontin  Hx of CAD s/p stenting. Hx of thromboembolism, HLD, PAD. - hold outpt eliquis for now  Recent episode of  pancreatitis. Severe protein calorie malnutrition. - lipase only 78 now >> was 1,797 during last admission at [facility] - prn zofran for nausea  Anemia of critical illness and chronic disease. - f/u CBC  DVT prophylaxis - SQ heparin SUP - not indicated Diet - NPO Goals of care - full code  Updated pt's family at bedside  CC time 45 minutes  Chesley Mires, MD [place] 11/17/2016, 5:14 PM Pager:  [phone] After 3pm call: [phone]

## 2016-11-17 NOTE — ED Notes (Addendum)
Dr Venora Maples aware of updated ABG and to consult ICU MD. Pt still has her eyes open, still moving around in the bed but not following commands. VSS.

## 2016-11-17 NOTE — ED Notes (Signed)
Called lab and added on lipase

## 2016-11-18 ENCOUNTER — Inpatient Hospital Stay (HOSPITAL_COMMUNITY): Payer: Medicaid Other

## 2016-11-18 DIAGNOSIS — R739 Hyperglycemia, unspecified: Secondary | ICD-10-CM

## 2016-11-18 DIAGNOSIS — J9601 Acute respiratory failure with hypoxia: Secondary | ICD-10-CM

## 2016-11-18 DIAGNOSIS — G9340 Encephalopathy, unspecified: Secondary | ICD-10-CM

## 2016-11-18 DIAGNOSIS — E1010 Type 1 diabetes mellitus with ketoacidosis without coma: Secondary | ICD-10-CM

## 2016-11-18 LAB — MAGNESIUM: Magnesium: 1.9 mg/dL (ref 1.7–2.4)

## 2016-11-18 LAB — URINALYSIS, ROUTINE W REFLEX MICROSCOPIC
Bilirubin Urine: NEGATIVE
Glucose, UA: 150 mg/dL — AB
Ketones, ur: 20 mg/dL — AB
Nitrite: NEGATIVE
Protein, ur: >=300 mg/dL — AB
Specific Gravity, Urine: 1.017 (ref 1.005–1.030)
pH: 5 (ref 5.0–8.0)

## 2016-11-18 LAB — BASIC METABOLIC PANEL
Anion gap: 8 (ref 5–15)
Anion gap: 12 (ref 5–15)
Anion gap: 10 (ref 5–15)
BUN: 31 mg/dL — ABNORMAL HIGH (ref 6–20)
BUN: 31 mg/dL — ABNORMAL HIGH (ref 6–20)
BUN: 31 mg/dL — ABNORMAL HIGH (ref 6–20)
CO2: 17 mmol/L — ABNORMAL LOW (ref 22–32)
CO2: 14 mmol/L — ABNORMAL LOW (ref 22–32)
CO2: 14 mmol/L — ABNORMAL LOW (ref 22–32)
Calcium: 7.8 mg/dL — ABNORMAL LOW (ref 8.9–10.3)
Calcium: 7.6 mg/dL — ABNORMAL LOW (ref 8.9–10.3)
Calcium: 7.9 mg/dL — ABNORMAL LOW (ref 8.9–10.3)
Chloride: 119 mmol/L — ABNORMAL HIGH (ref 101–111)
Chloride: 119 mmol/L — ABNORMAL HIGH (ref 101–111)
Chloride: 120 mmol/L — ABNORMAL HIGH (ref 101–111)
Creatinine, Ser: 1.79 mg/dL — ABNORMAL HIGH (ref 0.44–1.00)
Creatinine, Ser: 1.54 mg/dL — ABNORMAL HIGH (ref 0.44–1.00)
Creatinine, Ser: 1.47 mg/dL — ABNORMAL HIGH (ref 0.44–1.00)
GFR calc Af Amer: 38 mL/min — ABNORMAL LOW (ref >60)
GFR calc Af Amer: 45 mL/min — ABNORMAL LOW (ref >60)
GFR calc Af Amer: 48 mL/min — ABNORMAL LOW (ref >60)
GFR calc non Af Amer: 33 mL/min — ABNORMAL LOW (ref >60)
GFR calc non Af Amer: 39 mL/min — ABNORMAL LOW (ref >60)
GFR calc non Af Amer: 41 mL/min — ABNORMAL LOW (ref >60)
Glucose, Bld: 216 mg/dL — ABNORMAL HIGH (ref 65–99)
Glucose, Bld: 153 mg/dL — ABNORMAL HIGH (ref 65–99)
Glucose, Bld: 136 mg/dL — ABNORMAL HIGH (ref 65–99)
Potassium: 3 mmol/L — ABNORMAL LOW (ref 3.5–5.1)
Potassium: 4.7 mmol/L (ref 3.5–5.1)
Potassium: 5.2 mmol/L — ABNORMAL HIGH (ref 3.5–5.1)
Sodium: 144 mmol/L (ref 135–145)
Sodium: 145 mmol/L (ref 135–145)
Sodium: 144 mmol/L (ref 135–145)

## 2016-11-18 LAB — CBC
HCT: 27.7 % — ABNORMAL LOW (ref 36.0–46.0)
Hemoglobin: 9.9 g/dL — ABNORMAL LOW (ref 12.0–15.0)
MCH: 28.7 pg (ref 26.0–34.0)
MCHC: 35.7 g/dL (ref 30.0–36.0)
MCV: 80.3 fL (ref 78.0–100.0)
Platelets: 517 10*3/uL — ABNORMAL HIGH (ref 150–400)
RBC: 3.45 MIL/uL — ABNORMAL LOW (ref 3.87–5.11)
RDW: 13.5 % (ref 11.5–15.5)
WBC: 23.6 10*3/uL — ABNORMAL HIGH (ref 4.0–10.5)

## 2016-11-18 LAB — GLUCOSE, CAPILLARY
Glucose-Capillary: 201 mg/dL — ABNORMAL HIGH (ref 65–99)
Glucose-Capillary: 138 mg/dL — ABNORMAL HIGH (ref 65–99)
Glucose-Capillary: 100 mg/dL — ABNORMAL HIGH (ref 65–99)
Glucose-Capillary: 96 mg/dL (ref 65–99)
Glucose-Capillary: 238 mg/dL — ABNORMAL HIGH (ref 65–99)
Glucose-Capillary: 261 mg/dL — ABNORMAL HIGH (ref 65–99)
Glucose-Capillary: 215 mg/dL — ABNORMAL HIGH (ref 65–99)
Glucose-Capillary: 173 mg/dL — ABNORMAL HIGH (ref 65–99)
Glucose-Capillary: 134 mg/dL — ABNORMAL HIGH (ref 65–99)
Glucose-Capillary: 111 mg/dL — ABNORMAL HIGH (ref 65–99)
Glucose-Capillary: 123 mg/dL — ABNORMAL HIGH (ref 65–99)
Glucose-Capillary: 178 mg/dL — ABNORMAL HIGH (ref 65–99)
Glucose-Capillary: 196 mg/dL — ABNORMAL HIGH (ref 65–99)
Glucose-Capillary: 133 mg/dL — ABNORMAL HIGH (ref 65–99)

## 2016-11-18 LAB — PROCALCITONIN: Procalcitonin: 0.6 ng/mL

## 2016-11-18 LAB — PHOSPHORUS: Phosphorus: 1.9 mg/dL — ABNORMAL LOW (ref 2.5–4.6)

## 2016-11-18 MED ORDER — FENTANYL CITRATE (PF) 100 MCG/2ML IJ SOLN
100.0000 ug | INTRAMUSCULAR | Status: DC | PRN
  Administered 2016-11-18 (×2): 100 ug via INTRAVENOUS
  Filled 2016-11-18 (×4): qty 2

## 2016-11-18 MED ORDER — ETOMIDATE 2 MG/ML IV SOLN
10.0000 mg | Freq: Once | INTRAVENOUS | Status: AC
  Administered 2016-11-18: 20 mg via INTRAVENOUS

## 2016-11-18 MED ORDER — INSULIN GLARGINE 100 UNIT/ML ~~LOC~~ SOLN
15.0000 [IU] | Freq: Every day | SUBCUTANEOUS | Status: DC
  Administered 2016-11-18: 15 [IU] via SUBCUTANEOUS
  Filled 2016-11-18 (×2): qty 0.15

## 2016-11-18 MED ORDER — MIDAZOLAM HCL 2 MG/2ML IJ SOLN
2.0000 mg | INTRAMUSCULAR | Status: AC | PRN
  Administered 2016-11-18 (×3): 2 mg via INTRAVENOUS
  Filled 2016-11-18 (×3): qty 2

## 2016-11-18 MED ORDER — CHLORHEXIDINE GLUCONATE 0.12% ORAL RINSE (MEDLINE KIT)
15.0000 mL | Freq: Two times a day (BID) | OROMUCOSAL | Status: DC
  Administered 2016-11-18 – 2016-11-20 (×4): 15 mL via OROMUCOSAL

## 2016-11-18 MED ORDER — FENTANYL CITRATE (PF) 100 MCG/2ML IJ SOLN
INTRAMUSCULAR | Status: AC
  Filled 2016-11-18: qty 2

## 2016-11-18 MED ORDER — FENTANYL CITRATE (PF) 100 MCG/2ML IJ SOLN
INTRAMUSCULAR | Status: AC
  Administered 2016-11-18: 100 ug
  Filled 2016-11-18: qty 2

## 2016-11-18 MED ORDER — NYSTATIN 100000 UNIT/ML MT SUSP
5.0000 mL | Freq: Four times a day (QID) | OROMUCOSAL | Status: DC
  Filled 2016-11-18 (×2): qty 5

## 2016-11-18 MED ORDER — FLUCONAZOLE 100 MG PO TABS
100.0000 mg | ORAL_TABLET | Freq: Every day | ORAL | Status: DC
  Administered 2016-11-18: 100 mg
  Filled 2016-11-18 (×2): qty 1

## 2016-11-18 MED ORDER — POTASSIUM CHLORIDE 10 MEQ/100ML IV SOLN
10.0000 meq | INTRAVENOUS | Status: DC
  Administered 2016-11-18 (×3): 10 meq via INTRAVENOUS
  Filled 2016-11-18: qty 100

## 2016-11-18 MED ORDER — ORAL CARE MOUTH RINSE
15.0000 mL | Freq: Four times a day (QID) | OROMUCOSAL | Status: DC
  Administered 2016-11-19 – 2016-11-20 (×4): 15 mL via OROMUCOSAL

## 2016-11-18 MED ORDER — FENTANYL CITRATE (PF) 100 MCG/2ML IJ SOLN
100.0000 ug | INTRAMUSCULAR | Status: DC | PRN
  Administered 2016-11-18 – 2016-11-19 (×5): 100 ug via INTRAVENOUS
  Filled 2016-11-18 (×2): qty 2

## 2016-11-18 MED ORDER — GADOBENATE DIMEGLUMINE 529 MG/ML IV SOLN
8.0000 mL | Freq: Once | INTRAVENOUS | Status: AC | PRN
  Administered 2016-11-18: 8 mL via INTRAVENOUS

## 2016-11-18 MED ORDER — LACTATED RINGERS IV SOLN
INTRAVENOUS | Status: DC
  Administered 2016-11-18: 150 mL/h via INTRAVENOUS
  Administered 2016-11-19 – 2016-11-20 (×4): via INTRAVENOUS
  Administered 2016-11-20: 150 mL/h via INTRAVENOUS
  Administered 2016-11-21 (×2): via INTRAVENOUS
  Administered 2016-11-22: 150 mL/h via INTRAVENOUS

## 2016-11-18 MED ORDER — SODIUM PHOSPHATES 45 MMOLE/15ML IV SOLN
30.0000 mmol | Freq: Once | INTRAVENOUS | Status: AC
  Administered 2016-11-18: 30 mmol via INTRAVENOUS
  Filled 2016-11-18: qty 10

## 2016-11-18 MED ORDER — MIDAZOLAM HCL 2 MG/2ML IJ SOLN
INTRAMUSCULAR | Status: AC
  Administered 2016-11-18: 2 mg
  Filled 2016-11-18: qty 2

## 2016-11-18 MED ORDER — MIDAZOLAM HCL 2 MG/2ML IJ SOLN
INTRAMUSCULAR | Status: AC
  Filled 2016-11-18: qty 2

## 2016-11-18 MED ORDER — MIDAZOLAM HCL 2 MG/2ML IJ SOLN
2.0000 mg | INTRAMUSCULAR | Status: DC | PRN
  Administered 2016-11-18 – 2016-11-19 (×3): 2 mg via INTRAVENOUS
  Filled 2016-11-18 (×2): qty 2

## 2016-11-18 MED ORDER — INSULIN ASPART 100 UNIT/ML ~~LOC~~ SOLN
0.0000 [IU] | SUBCUTANEOUS | Status: DC
  Administered 2016-11-18: 2 [IU] via SUBCUTANEOUS
  Administered 2016-11-18: 3 [IU] via SUBCUTANEOUS

## 2016-11-18 MED ORDER — LORAZEPAM 2 MG/ML IJ SOLN: 1.0000 mg | INTRAMUSCULAR | Status: DC | PRN

## 2016-11-18 MED ORDER — POTASSIUM CHLORIDE 10 MEQ/100ML IV SOLN
10.0000 meq | INTRAVENOUS | Status: AC
  Administered 2016-11-18 (×2): 10 meq via INTRAVENOUS
  Filled 2016-11-18: qty 100

## 2016-11-18 NOTE — Progress Notes (Signed)
EEG Completed; Results Pending  

## 2016-11-18 NOTE — Progress Notes (Signed)
[facility] Progress Note Patient Name: [name] DOB: 06/29/1970 MRN: [id number]   Date of Service  11/18/2016  HPI/Events of Note  Notified by bedside nurse the patient was experiencing "jerking" movements earlier suspicious for possible seizure activity. Camera shows patient opening eyes with stimulation and moving all 4 extremities spontaneously. Still largely nonverbal and not answering questions. Grimacing intermittently. Mental status at this time his baseline from patient's admission earlier per nurse. No known history of seizure activity. Patient does have history of stroke.   eICU Interventions  1. Ordering IV Ativan as needed for any signs of seizures 2. Seizure precautions ordered 3. Neurochecks every hour ordered 4. EEG ordered      Intervention Category Major Interventions: Seizures - evaluation and management  Tera Partridge 11/18/2016, 4:22 AM

## 2016-11-18 NOTE — Procedures (Signed)
Intubation Procedure Note [name] [id number] [birth year]  Procedure: Intubation Indications: Airway protection and maintenance  Procedure Details Consent: Risks of procedure as well as the alternatives and risks of each were explained to the (patient/caregiver).  Consent for procedure obtained. Time Out: Verified patient identification, verified procedure, site/side was marked, verified correct patient position, special equipment/implants available, medications/allergies/relevent history reviewed, required imaging and test results available.  Performed  Maximum sterile technique was used including gloves, hand hygiene and mask.  MAC    Evaluation Hemodynamic Status: BP stable throughout; O2 sats: stable throughout Patient's Current Condition: stable Complications: No apparent complications Patient did tolerate procedure well. Chest X-ray ordered to verify placement.  CXR: pending.   [name] 11/18/2016

## 2016-11-18 NOTE — Procedures (Signed)
OGT Placement By MD  OGT inserted under direct laryngoscopy and verified by auscultation  Wesam G. Yacoub, M.D. [facility]. Pager: [phone]. After hours pager: [phone]. 

## 2016-11-18 NOTE — Progress Notes (Signed)
[place] Progress Note Patient Name: RHAYNE CHATWIN DOB: Aug 24, 1970 MRN: [id number]   Date of Service  11/18/2016  HPI/Events of Note  Serum potassium 3.0 with a little over one run of potassium left to administer. Creatinine 1.79. Patient has peripheral IV access. Bicarbonate improving. Anion gap now 8. Continuing on insulin drip.   eICU Interventions  1. Continuing insulin drip 2. Continuing to monitor electrolytes every 4 hours 3. Ordering KCl 10 mEq 5 additional runs 4. Switching maintenance IV fluid to lactated Ringer's at 150 mL per hour      Intervention Category Major Interventions: Acid-Base disturbance - evaluation and management;Electrolyte abnormality - evaluation and management  Tera Partridge 11/18/2016, 2:37 AM

## 2016-11-18 NOTE — Progress Notes (Signed)
Initial Nutrition Assessment  DOCUMENTATION CODES:   Underweight  INTERVENTION:   Recommend start TF via OGT if unable to extubate within the next 24 hours: Vital AF 1.2 at 50 ml/h (1200 ml per day) Provides 1440 kcal, 90 gm protein, 973 ml free water daily  NUTRITION DIAGNOSIS:   Inadequate oral intake related to inability to eat as evidenced by NPO status.  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  Discussed patient in ICU rounds and with RN today. Visited patient this morning, spoke with Mother who reports patient was not eating well PTA and likely also wasn't taking her insulin as scheduled. Patient was lying in bed, grimacing; she was unable to provide any nutrition information. Patient was recently discharged last Saturday from [facility] where she was being treated to pancreatitis. Intake during hospitalization was poor. Patient required intubation this afternoon.  Patient is currently intubated on ventilator support MV: 9.2 L/min Temp (24hrs), Avg:98.2 F (36.8 C), Min:96.1 F (35.6 C), Max:99.9 F (37.7 C)   Labs reviewed. 5.2 (H) CBG's: [phone] Medications reviewed and include sodium phosphate.  NUTRITION - FOCUSED PHYSICAL EXAM:    Most Recent Value  Orbital Region  Unable to assess  Upper Arm Region  Unable to assess  Thoracic and Lumbar Region  Unable to assess  Buccal Region  Unable to assess  Temple Region  Unable to assess  Clavicle Bone Region  Unable to assess  Clavicle and Acromion Bone Region  Unable to assess  Scapular Bone Region  Unable to assess  Dorsal Hand  Unable to assess  Patellar Region  Severe depletion  Anterior Thigh Region   Severe depletion  Posterior Calf Region  Unable to assess  Edema (RD Assessment)  Unable to assess  Hair  Reviewed  Eyes  Unable to assess  Mouth  Unable to assess  Skin  Reviewed  Nails  Unable to assess       Diet Order:  Diet NPO time specified  EDUCATION NEEDS:   No education needs have been identified at this time  Skin:  Skin Assessment: Reviewed RN Assessment  Last BM:  unknown  Height:   Ht Readings from Last 1 Encounters:  11/18/16 5\' 5"  (1.651 m)    Weight:   Wt Readings from Last 1 Encounters:  11/18/16 97 lb (44 kg)    Ideal Body Weight:  53.1 kg  BMI:  17.3 (using adjusted weight for amputation  Estimated Nutritional Needs:   Kcal:  1435  Protein:  70-80 gm  Fluid:  1.5 L    Molli Barrows, RD, LDN, [place] Pager [phone] After Hours Pager [phone]

## 2016-11-18 NOTE — Progress Notes (Signed)
RT note- Patient taken to MRI and now back in 39M, remains on current setting, with fio2 decreased to 60%.

## 2016-11-18 NOTE — ED Provider Notes (Signed)
[place] 36[facility] MEDICAL ICU Provider Note   CSN: [id number] Arrival date & time: 11/17/16  1049     History   Chief Complaint Chief Complaint  Patient presents with  . Hyperglycemia   Level 5 caveat: Altered mental status  HPI [name] is a 46 y.o. female.  HPI Patient is a 46 year old female with a history of poorly controlled type 1 diabetes who presents the emergency department hypothermic and hypotensive with alteration in her mental status as well as a CBG reading greater than 600.  Family reports noncompliance with her medications.  No other significant history available at time of initial evaluation.  Patient unable to provide any additional information.   Past Medical History:  Diagnosis Date  . Anxiety   . 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 Coronary atherosclerosis of native coronary artery    DES to LAD September 2014  . Depression   . Facial cellulitis    12/2010  . Glomerulonephritis   . Headache(784.0)   . History of stroke   . Hyperlipidemia   . Insulin dependent diabetes mellitus ([place])    History of diabetic ketoacidosis  . Ischemic cardiomyopathy    LVEF 40-45%  . Peripheral arterial disease ([place])   . Shingles    11/2010  . ST elevation myocardial infarction (STEMI) of anterior wall [facility])    Late presentation September 2014    Patient Active Problem List   Diagnosis Date Noted  . Acute respiratory failure with hypoxia ([place])   . DKA, type 1 ([place]) 11/17/2016  . Metabolic encephalopathy 54/27/0623  . Pancreatitis, acute 11/07/2016  . Abscess of vulva 04/01/2016  . PAD (peripheral artery disease) ([place]) 11/12/2014  . Coronary atherosclerosis of native coronary artery 02/20/2013  . Cardiomyopathy, ischemic 02/20/2013  . History of stroke 04/13/2011  . Thromboembolism of lower extremity artery ([place]) 02/09/2011  . Peripheral arterial disease ([place])   . Hyperlipidemia   . Tobacco  abuse   . Insulin dependent diabetes mellitus with complications ([place]) 76/28/3151    Past Surgical History:  Procedure Laterality Date  . AMPUTATION  03/03/2011   Procedure: AMPUTATION DIGIT;  Surgeon: Newt Minion, MD;  Location: [place];  Service: Orthopedics;  Laterality: Left;  Left Foot Amputation 4th and 5th toes at MTP joint  . AMPUTATION  04/22/2011   Procedure: AMPUTATION BELOW KNEE;  Surgeon: Newt Minion, MD;  Location: [place];  Service: Orthopedics;  Laterality: Left;  Left Below Knee Amputation  . DILATION AND CURETTAGE OF UTERUS    . EMBOLECTOMY  01/29/2011   Procedure: EMBOLECTOMY;  Surgeon: Mal Misty, MD;  Location: [facility] OR;  Service: Vascular;  Laterality: Left;  Left Popliteal and Tibial Embolectomy with patch angioplasty  . INCISION AND DRAINAGE ABSCESS N/A 04/06/2016   Procedure: INCISION AND DRAINAGE ABSCESS;  Surgeon: Jonnie Kind, MD;  Location: [facility];  Service: Gynecology;  Laterality: N/A;  . LEFT HEART CATHETERIZATION WITH CORONARY ANGIOGRAM N/A 10/01/2012   Procedure: LEFT HEART CATHETERIZATION WITH CORONARY ANGIOGRAM;  Surgeon: Peter M Martinique, MD;  Location: [facility] C[facility] LAB;  Service: Cardiovascular;  Laterality: N/A;  . LOWER EXTREMITY ANGIOGRAM N/A 01/27/2011   Procedure: LOWER EXTREMITY ANGIOGRAM;  Surgeon: Rosetta Posner, MD;  Location: [facility] CATH LAB;  Service: Cardiovascular;  Laterality: N/A;  . LOWER EXTREMITY ANGIOGRAM N/A 01/28/2011   Procedure: LOWER EXTREMITY ANGIOGRAM;  Surgeon: Conrad , MD;  Location: [facility] CATH LAB;  Service:  Cardiovascular;  Laterality: N/A;  . LOWER EXTREMITY ANGIOGRAM Left 01/29/2011   Procedure: LOWER EXTREMITY ANGIOGRAM;  Surgeon: Mal Misty, MD;  Location: [facility] CATH LAB;  Service: Cardiovascular;  Laterality: Left;  Marland Kitchen MULTIPLE TOOTH EXTRACTIONS    . TEE WITHOUT CARDIOVERSION  04/15/2011   Procedure: TRANSESOPHAGEAL ECHOCARDIOGRAM (TEE);  Surgeon: [name], MD;  Location: [facility];  Service: Cardiovascular;  Laterality:  N/A;  . WRIST SURGERY     Left    OB History    Gravida Para Term Preterm AB Living   1 1   1   1    SAB TAB Ectopic Multiple Live Births                   Home Medications    Prior to Admission medications   Medication Sig Start Date End Date Taking? Authorizing Provider  ALPRAZolam Duanne Moron) 1 MG tablet Take 1 mg by mouth 3 (three) times daily.    Yes [provider]  apixaban (ELIQUIS) 5 MG TABS tablet Take 5 mg by mouth 2 (two) times daily.   Yes [provider]  gabapentin (NEURONTIN) 300 MG capsule Take 300 mg by mouth at bedtime.   Yes [provider]  insulin aspart (NOVOLOG FLEXPEN) 100 UNIT/ML injection Inject 2-10 Units into the skin 3 (three) times daily before meals. Sliding scale as follows 200-250=2 units 251-300=4 units 301-350=6 units 351-400=8 units >400-give 10 units and Notify MD   Yes [provider]  Insulin Glargine (LANTUS SOLOSTAR) 100 UNIT/ML SOPN Inject 15 Units into the skin at bedtime. Patient taking differently: Inject 25 Units into the skin at bedtime.  10/04/12  Yes Donne Hazel, MD  nitroGLYCERIN (NITROSTAT) 0.4 MG SL tablet Place 1 tablet (0.4 mg total) under the tongue every 5 (five) minutes as needed for chest pain. 10/30/12  Yes Lendon Colonel, NP  oxyCODONE-acetaminophen (PERCOCET) 10-325 MG tablet Take 1 tablet by mouth 4 (four) times daily. 11/13/16  Yes Sinda Du, MD  promethazine (PHENERGAN) 12.5 MG tablet Take 12.5 mg by mouth every 4 (four) hours as needed for nausea or vomiting.    Yes [provider]    Family History Family History  Problem Relation Age of Onset  . COPD Mother        alive - 39  . Hypertension Mother   . Diabetes Mother   . Stroke Mother   . Coronary artery disease Mother   . Diabetes Father        alive - 16  . Hypertension Father   . Coronary artery disease Father   . Anesthesia problems Neg Hx     Social History Social History  Substance Use  Topics  . Smoking status: Current Every Day Smoker    Packs/day: 0.50    Years: 21.00    Types: Cigarettes    Start date: 07/06/1989    Last attempt to quit: 01/30/2011  . Smokeless tobacco: Never Used  . Alcohol use No     Comment: rarely     Allergies   Ibuprofen and Losartan   Review of Systems Review of Systems  Unable to perform ROS: Mental status change     Physical Exam Updated Vital Signs BP (!) 171/83 (BP Location: Left Arm)   Pulse (!) 116   Temp 98.6 F (37 C) (Axillary)   Resp 16   Ht 5\' 5"  (1.651 m)   Wt 44 kg (97 lb)   LMP 01/12/2011  SpO2 100%   BMI 16.14 kg/m   Physical Exam  Constitutional: She appears well-developed and well-nourished.  HENT:  Head: Normocephalic and atraumatic.  Eyes: EOM are normal.  Neck: Normal range of motion.  Cardiovascular:  Tachycardia  Pulmonary/Chest: Effort normal and breath sounds normal.  Abdominal: Soft. She exhibits no distension.  Musculoskeletal: Normal range of motion. She exhibits no edema or deformity.  Neurological:  Opens eyes to voice.  Does not follow simple commands.  Localizes to pain.  Skin: Skin is warm. No rash noted. No erythema.  Psychiatric: She has a normal mood and affect.  Nursing note and vitals reviewed.    ED Treatments / Results  Labs (all labs ordered are listed, but only abnormal results are displayed) Labs Reviewed  CBC - Abnormal; Notable for the following:       Result Value   WBC 24.3 (*)    RBC 3.27 (*)    Hemoglobin 9.2 (*)    HCT 33.2 (*)    MCV 101.5 (*)    MCHC 27.7 (*)    Platelets 630 (*)    All other components within normal limits  COMPREHENSIVE METABOLIC PANEL - Abnormal; Notable for the following:    Sodium 131 (*)    Potassium 6.3 (*)    Chloride 96 (*)    CO2 <7 (*)    Glucose, Bld 1,429 (*)    BUN 40 (*)    Creatinine, Ser 2.76 (*)    Calcium 8.3 (*)    Total Protein 5.2 (*)    Albumin 2.0 (*)    AST 45 (*)    ALT 12 (*)    Total Bilirubin  1.8 (*)    GFR calc non Af Amer 19 (*)    GFR calc Af Amer 22 (*)    All other components within normal limits  URINALYSIS, ROUTINE W REFLEX MICROSCOPIC - Abnormal; Notable for the following:    APPearance HAZY (*)    Glucose, UA >=500 (*)    Hgb urine dipstick MODERATE (*)    Ketones, ur 20 (*)    Protein, ur 100 (*)    Bacteria, UA RARE (*)    All other components within normal limits  BLOOD GAS, ARTERIAL - Abnormal; Notable for the following:    pH, Arterial 6.965 (*)    pO2, Arterial 156 (*)    Bicarbonate 2.3 (*)    Acid-base deficit 28.1 (*)    All other components within normal limits  LIPASE, BLOOD - Abnormal; Notable for the following:    Lipase 78 (*)    All other components within normal limits  BLOOD GAS, ARTERIAL - Abnormal; Notable for the following:    pH, Arterial 7.122 (*)    pO2, Arterial 113 (*)    Bicarbonate 3.3 (*)    Acid-base deficit 25.2 (*)    All other components within normal limits  BASIC METABOLIC PANEL - Abnormal; Notable for the following:    Potassium 3.1 (*)    CO2 <7 (*)    Glucose, Bld 805 (*)    BUN 36 (*)    Creatinine, Ser 2.48 (*)    Calcium 7.4 (*)    GFR calc non Af Amer 22 (*)    GFR calc Af Amer 26 (*)    All other components within normal limits  BASIC METABOLIC PANEL - Abnormal; Notable for the following:    Potassium 2.8 (*)    Chloride 116 (*)    CO2 14 (*)  Glucose, Bld 500 (*)    BUN 34 (*)    Creatinine, Ser 2.20 (*)    Calcium 7.8 (*)    GFR calc non Af Amer 25 (*)    GFR calc Af Amer 29 (*)    All other components within normal limits  BASIC METABOLIC PANEL - Abnormal; Notable for the following:    Potassium 3.0 (*)    Chloride 119 (*)    CO2 17 (*)    Glucose, Bld 216 (*)    BUN 31 (*)    Creatinine, Ser 1.79 (*)    Calcium 7.8 (*)    GFR calc non Af Amer 33 (*)    GFR calc Af Amer 38 (*)    All other components within normal limits  BASIC METABOLIC PANEL - Abnormal; Notable for the following:     Chloride 119 (*)    CO2 14 (*)    Glucose, Bld 153 (*)    BUN 31 (*)    Creatinine, Ser 1.54 (*)    Calcium 7.6 (*)    GFR calc non Af Amer 39 (*)    GFR calc Af Amer 45 (*)    All other components within normal limits  PHOSPHORUS - Abnormal; Notable for the following:    Phosphorus 1.9 (*)    All other components within normal limits  CBC - Abnormal; Notable for the following:    WBC 23.6 (*)    RBC 3.45 (*)    Hemoglobin 9.9 (*)    HCT 27.7 (*)    Platelets 517 (*)    All other components within normal limits  GLUCOSE, CAPILLARY - Abnormal; Notable for the following:    Glucose-Capillary >600 (*)    All other components within normal limits  GLUCOSE, CAPILLARY - Abnormal; Notable for the following:    Glucose-Capillary 570 (*)    All other components within normal limits  GLUCOSE, CAPILLARY - Abnormal; Notable for the following:    Glucose-Capillary 519 (*)    All other components within normal limits  GLUCOSE, CAPILLARY - Abnormal; Notable for the following:    Glucose-Capillary 437 (*)    All other components within normal limits  GLUCOSE, CAPILLARY - Abnormal; Notable for the following:    Glucose-Capillary 373 (*)    All other components within normal limits  GLUCOSE, CAPILLARY - Abnormal; Notable for the following:    Glucose-Capillary 280 (*)    All other components within normal limits  GLUCOSE, CAPILLARY - Abnormal; Notable for the following:    Glucose-Capillary 201 (*)    All other components within normal limits  GLUCOSE, CAPILLARY - Abnormal; Notable for the following:    Glucose-Capillary 138 (*)    All other components within normal limits  GLUCOSE, CAPILLARY - Abnormal; Notable for the following:    Glucose-Capillary 100 (*)    All other components within normal limits  GLUCOSE, CAPILLARY - Abnormal; Notable for the following:    Glucose-Capillary 238 (*)    All other components within normal limits  GLUCOSE, CAPILLARY - Abnormal; Notable for the  following:    Glucose-Capillary 261 (*)    All other components within normal limits  GLUCOSE, CAPILLARY - Abnormal; Notable for the following:    Glucose-Capillary 215 (*)    All other components within normal limits  GLUCOSE, CAPILLARY - Abnormal; Notable for the following:    Glucose-Capillary 173 (*)    All other components within normal limits  GLUCOSE, CAPILLARY - Abnormal; Notable for  the following:    Glucose-Capillary 134 (*)    All other components within normal limits  BASIC METABOLIC PANEL - Abnormal; Notable for the following:    Potassium 5.2 (*)    Chloride 120 (*)    CO2 14 (*)    Glucose, Bld 136 (*)    BUN 31 (*)    Creatinine, Ser 1.47 (*)    Calcium 7.9 (*)    GFR calc non Af Amer 41 (*)    GFR calc Af Amer 48 (*)    All other components within normal limits  GLUCOSE, CAPILLARY - Abnormal; Notable for the following:    Glucose-Capillary 111 (*)    All other components within normal limits  GLUCOSE, CAPILLARY - Abnormal; Notable for the following:    Glucose-Capillary 123 (*)    All other components within normal limits  GLUCOSE, CAPILLARY - Abnormal; Notable for the following:    Glucose-Capillary 178 (*)    All other components within normal limits  GLUCOSE, CAPILLARY - Abnormal; Notable for the following:    Glucose-Capillary 196 (*)    All other components within normal limits  CBG MONITORING, ED - Abnormal; Notable for the following:    Glucose-Capillary >600 (*)    All other components within normal limits  I-STAT CG4 LACTIC ACID, ED - Abnormal; Notable for the following:    Lactic Acid, Venous 2.93 (*)    All other components within normal limits  CBG MONITORING, ED - Abnormal; Notable for the following:    Glucose-Capillary >600 (*)    All other components within normal limits  CBG MONITORING, ED - Abnormal; Notable for the following:    Glucose-Capillary >600 (*)    All other components within normal limits  CBG MONITORING, ED - Abnormal;  Notable for the following:    Glucose-Capillary >600 (*)    All other components within normal limits  CBG MONITORING, ED - Abnormal; Notable for the following:    Glucose-Capillary >600 (*)    All other components within normal limits  CG4 I-STAT (LACTIC ACID) - Abnormal; Notable for the following:    Lactic Acid, Venous 2.40 (*)    All other components within normal limits  MRSA PCR SCREENING  CULTURE, BLOOD (ROUTINE X 2)  CULTURE, BLOOD (ROUTINE X 2)  URINE CULTURE  ETHANOL  PHOSPHORUS  MAGNESIUM  MAGNESIUM  GLUCOSE, CAPILLARY  PROCALCITONIN  URINALYSIS, ROUTINE W REFLEX MICROSCOPIC  I-STAT BETA HCG BLOOD, ED ([facility], [facility], [facility] ONLY)  I-STAT ARTERIAL BLOOD GAS, ED  I-STAT CG4 LACTIC ACID, ED  I-STAT ARTERIAL BLOOD GAS, ED    EKG  EKG Interpretation None       Radiology Ct Head Wo Contrast  Result Date: 11/17/2016 CLINICAL DATA:  Altered level of consciousness. EXAM: CT HEAD WITHOUT CONTRAST TECHNIQUE: Contiguous axial images were obtained from the base of the skull through the vertex without intravenous contrast. COMPARISON:  09/30/2012 FINDINGS: Brain: Old infarcts in the right frontal lobe. Low-density noted in the left parietal lobe is new since 2014 compatible with age-indeterminate infarct. Chronic microvascular disease in the deep white matter. No hydrocephalus. Mild age advanced cerebral atrophy. Vascular: No hyperdense vessel or unexpected calcification. Skull: No acute calvarial abnormality. Sinuses/Orbits: Visualized paranasal sinuses and mastoids clear. Orbital soft tissues unremarkable. Other: None IMPRESSION: Old right frontal infarct. New low-density scratched at low-density area in the left parietal lobe compatible with infarct, age indeterminate, new since 2014. Atrophy, chronic microvascular disease. Electronically Signed   By: Rolm Baptise  M.D.   On: 11/17/2016 12:13   Dg Chest Port 1 View  Result Date: 11/18/2016 CLINICAL DATA:  Intubated patient, acute  respiratory failure, diabetes, current smoker. EXAM: PORTABLE CHEST 1 VIEW COMPARISON:  Portable chest x-ray of November 17, 2016 FINDINGS: An endotracheal tube is been placed whose tip projects 3.4 cm above the carina. The esophagogastric tube tip and proximal port project below the GE junction. The lungs are well-expanded. There is no focal infiltrate. There is no pleural effusion or pneumothorax. The heart and pulmonary vascularity are normal. The bony thorax is unremarkable. IMPRESSION: Interval intubation of the trachea and esophagus with reasonable positioning of the tubes. Clear lungs. No CHF. Electronically Signed   By: David  Martinique M.D.   On: 11/18/2016 15:06   Dg Chest Portable 1 View  Result Date: 11/17/2016 CLINICAL DATA:  Unresponsive.  Hyperglycemia. EXAM: PORTABLE CHEST 1 VIEW COMPARISON:  January 07, 2013 FINDINGS: Lungs are clear. Heart size and pulmonary vascularity are normal. No adenopathy. There is an apparent coronary stent on the left. No evident bone lesions. No pneumothorax. IMPRESSION: No edema or consolidation. Electronically Signed   By: Lowella Grip III M.D.   On: 11/17/2016 11:10    Procedures .Critical Care Performed by: Jola Schmidt Authorized by: Jola Schmidt     CRITICAL CARE Performed by: Hoy Morn Total critical care time: 45 minutes Critical care time was exclusive of separately billable procedures and treating other patients. Critical care was necessary to treat or prevent imminent or life-threatening deterioration. Critical care was time spent personally by me on the following activities: development of treatment plan with patient and/or surrogate as well as nursing, discussions with consultants, evaluation of patient's response to treatment, examination of patient, obtaining history from patient or surrogate, ordering and performing treatments and interventions, ordering and review of laboratory studies, ordering and review of radiographic  studies, pulse oximetry and re-evaluation of patient's condition.   Medications Ordered in ED Medications  insulin regular (NOVOLIN R,HUMULIN R) 100 Units in sodium chloride 0.9 % 100 mL (1 Units/mL) infusion (0 Units/hr Intravenous Stopped 11/18/16 1525)  heparin injection 5,000 Units (5,000 Units Subcutaneous Given 11/18/16 1507)  ondansetron (ZOFRAN) injection 4 mg (4 mg Intravenous Given 11/18/16 1235)  MEDLINE mouth rinse (15 mLs Mouth Rinse Given 11/18/16 0800)  potassium chloride 10 mEq in 100 mL IVPB (0 mEq Intravenous Stopped 11/17/16 2259)  lactated ringers infusion (150 mL/hr Intravenous New Bag/Given 11/18/16 0245)  LORazepam (ATIVAN) injection 1-2 mg (not administered)  insulin glargine (LANTUS) injection 15 Units (15 Units Subcutaneous Given 11/18/16 1322)  insulin aspart (novoLOG) injection 0-15 Units (3 Units Subcutaneous Given 11/18/16 1527)  sodium phosphate 30 mmol in dextrose 5 % 250 mL infusion (30 mmol Intravenous New Bag/Given 11/18/16 1430)  fentaNYL (SUBLIMAZE) injection 100 mcg (100 mcg Intravenous Given 11/18/16 1517)  fentaNYL (SUBLIMAZE) injection 100 mcg (not administered)  midazolam (VERSED) injection 2 mg (2 mg Intravenous Given 11/18/16 1518)  midazolam (VERSED) injection 2 mg (not administered)  fluconazole (DIFLUCAN) tablet 100 mg (not administered)  sodium chloride 0.9 % bolus 1,000 mL (0 mLs Intravenous Stopped 11/17/16 1220)  sodium chloride 0.9 % bolus 1,000 mL (0 mLs Intravenous Stopped 11/17/16 1254)  insulin aspart (novoLOG) injection 10 Units (10 Units Intravenous Given 11/17/16 1135)  potassium chloride 10 mEq in 100 mL IVPB (0 mEq Intravenous Stopped 11/18/16 0424)  potassium chloride 10 mEq in 100 mL IVPB (0 mEq Intravenous Stopped 11/18/16 0312)  fentaNYL (SUBLIMAZE) 100 MCG/2ML injection (100 mcg  Given 11/18/16 1435)  midazolam (VERSED) 2 MG/2ML injection (2 mg  Given 11/18/16 1435)  fentaNYL (SUBLIMAZE) 100 MCG/2ML injection (  Duplicate 51/8/84 1660)    midazolam (VERSED) 2 MG/2ML injection (  Duplicate 63/0/16 0109)  etomidate (AMIDATE) injection 10 mg (20 mg Intravenous Given 11/18/16 1501)     Initial Impression / Assessment and Plan / ED Course  I have reviewed the triage vital signs and the nursing notes.  Pertinent labs & imaging results that were available during my care of the patient were reviewed by me and considered in my medical decision making (see chart for details).     Evidence of diabetic ketoacidosis and severe acidosis with pH initially 6.9.  This improved with IV fluids and IV insulin.  Her mental status is improving as well.  Given these significant hypothermia and derangement of her lab value she will be admitted the intensive care unit for ongoing management of her diabetic ketoacidosis with diabetic coma.  PCCM involved.  Final Clinical Impressions(s) / ED Diagnoses   Final diagnoses:  Diabetic ketoacidosis with coma associated with type 1 diabetes mellitus [facility])    New Prescriptions Current Discharge Medication List       Jola Schmidt, MD 11/18/16 1643

## 2016-11-18 NOTE — Progress Notes (Signed)
Received diabetes coordinator consult for transitioning off insulin drip. Noted that a BMET needs to be checked every 4 hours especially while on insulin drip. Recommend checking another BMET now.  Patient was discharged from [facility] on 10/27 and was discharged on Lantus 15 units daily and a Novolog correction scale 2-10 units TID with meals.  Recommend Lantus 15 units daily and to be given 2 hours before stopping insulin drip. Recommend starting Novolog MODERATE correction scale every 4 hours while NPO and then TID & HS when eating as soon as insulin drip is stopped. May need to titrate dosage as needed. Will continue to monitor blood sugars while in the hospital.    Harvel Ricks RN BSN CDE Diabetes Coordinator Pager: [phone]  8am-5pm

## 2016-11-18 NOTE — Progress Notes (Signed)
PULMONARY / CRITICAL CARE MEDICINE   Name: [name] MRN: [id number] DOB: 08-31-1970    ADMISSION DATE:  11/17/2016  REFERRING MD:  Dr. Venora Maples, ER  CHIEF COMPLAINT:  Altered mental status  HISTORY OF PRESENT ILLNESS:   Hx from ER staff, chart, and family.  46 yo female at [facility] from 10/20 to 10/27 with acute pancreatitis and delirium.  No clear cause of her pancreatitis was found.  After discharge home, the family reports that [name] was not compliant with her diabetes regimen.  She didn't have a means to test her blood sugars, and would sporadically use her insulin.  She became progressively more short of breath and nauseous over the past 2 days.  She was also getting dry heaves.  She started getting more confused.  She was brought to ER and found to have severe metabolic acidosis with hyperglycemia consistent with DKA.  SUBJECTIVE:   Patient sleeping but opens eyes and moans to exam. Unable to follow commands. Family reports during hospitalization last week, they noticed that she was acutely weak in RUE on 10/26.   VITAL SIGNS: BP 136/70 (BP Location: Left Arm)   Pulse (!) 101   Temp 99.5 F (37.5 C) (Core (Comment))   Resp (!) 24   Ht 5\' 1"  (1.549 m)   Wt 44 kg (97 lb)   LMP 01/12/2011   SpO2 100%   BMI 18.33 kg/m   INTAKE / OUTPUT: I/O last 3 completed shifts: In: 4067.1 [I.V.:2067.1; IV Piggyback:2000] Out: 1325 [Urine:1325]  PHYSICAL EXAMINATION: General - somnolent Eyes - pupils reactive ENT - dry mucosa, no LAD Cardiac - regular, tachycardic, no murmur Chest - no wheeze, rales, easy WOB Abd - soft, non tender Ext - no edema, Lt BKA Skin - no rashes Neuro - opens eyes with stimulation, not following commands, ?RUE weakness.   LABS:  BMET  Recent Labs Lab 11/17/16 2052 11/18/16 0032 11/18/16 0459  NA 144 144 145  K 2.8* 3.0* 4.7  CL 116* 119* 119*  CO2 14* 17* 14*  BUN 34* 31* 31*  CREATININE 2.20* 1.79* 1.54*  GLUCOSE 500* 216* 153*    Electrolytes  Recent Labs Lab 11/17/16 1706 11/17/16 2052 11/18/16 0032 11/18/16 0459  CALCIUM 7.4* 7.8* 7.8* 7.6*  MG 2.2  --   --  1.9  PHOS 3.3  --   --  1.9*   CBC  Recent Labs Lab 11/17/16 1103 11/18/16 0459  WBC 24.3* 23.6*  HGB 9.2* 9.9*  HCT 33.2* 27.7*  PLT 630* 517*   Coag's No results for input(s): APTT, INR in the last 168 hours.  Sepsis Markers  Recent Labs Lab 11/17/16 1316 11/17/16 1737  LATICACIDVEN 2.93* 2.40*   ABG  Recent Labs Lab 11/17/16 1100 11/17/16 1530  PHART 6.965* 7.122*  PCO2ART BELOW REPORTABLE RANGE BELOW REPORTABLE RANGE  PO2ART 156* 113*   Liver Enzymes  Recent Labs Lab 11/17/16 1103  AST 45*  ALT 12*  ALKPHOS 110  BILITOT 1.8*  ALBUMIN 2.0*   Cardiac Enzymes No results for input(s): TROPONINI, PROBNP in the last 168 hours.  Glucose  Recent Labs Lab 11/18/16 0152 11/18/16 0258 11/18/16 0406 11/18/16 0636 11/18/16 0757 11/18/16 0857  GLUCAP 138* 100* 96 238* 261* 215*   Imaging Ct Head Wo Contrast  Result Date: 11/17/2016 CLINICAL DATA:  Altered level of consciousness. EXAM: CT HEAD WITHOUT CONTRAST TECHNIQUE: Contiguous axial images were obtained from the base of the skull through the vertex without intravenous contrast. COMPARISON:  09/30/2012 FINDINGS: Brain: Old infarcts in the right frontal lobe. Low-density noted in the left parietal lobe is new since 2014 compatible with age-indeterminate infarct. Chronic microvascular disease in the deep white matter. No hydrocephalus. Mild age advanced cerebral atrophy. Vascular: No hyperdense vessel or unexpected calcification. Skull: No acute calvarial abnormality. Sinuses/Orbits: Visualized paranasal sinuses and mastoids clear. Orbital soft tissues unremarkable. Other: None IMPRESSION: Old right frontal infarct. New low-density scratched at low-density area in the left parietal lobe compatible with infarct, age indeterminate, new since 2014. Atrophy, chronic  microvascular disease. Electronically Signed   By: Rolm Baptise M.D.   On: 11/17/2016 12:13   Dg Chest Portable 1 View  Result Date: 11/17/2016 CLINICAL DATA:  Unresponsive.  Hyperglycemia. EXAM: PORTABLE CHEST 1 VIEW COMPARISON:  January 07, 2013 FINDINGS: Lungs are clear. Heart size and pulmonary vascularity are normal. No adenopathy. There is an apparent coronary stent on the left. No evident bone lesions. No pneumothorax. IMPRESSION: No edema or consolidation. Electronically Signed   By: Lowella Grip III M.D.   On: 11/17/2016 11:10   STUDIES:  CT head 10/31 >> Old Rt frontal infarct, Age indeterminate infarct Lt parietal lobe, chronic microvascular disease, cerebral atrophy  SIGNIFICANT EVENTS: 10/31 Admit 11/1 BG improving  DISCUSSION: 46 yo female with DKA with coma likely related to medical non compliance.  ASSESSMENT / PLAN:  DKA. Poor compliance with insulin regimen.  - IV insulin gtt until anion gap corrected - continue LR at 19ml/hr  Hypokalemia in setting of DKA. Mg and Phos appropriate. S/p 9 runs IV K. K 4.7 11/1.  - monitor heart rhythm - f/u BMET q4h  Acute renal failure with ATN >> baseline creatinine 0.72 from 11/08/16. - continue volume resuscitation  Acute metabolic encephalopathy. Hx of CVA, anxiety, chronic pain, peripheral neuropathy. Hx of opioid use at home (percocet 10/325 q4 on home meds) - monitor mental status while correcting DKA - hold outpt xanax, percocet, neurontin - monitor for opioid withdrawal  Hx of CAD s/p stenting. Hx of thromboembolism, HLD, PAD. - hold outpt eliquis for now  Recent episode of pancreatitis. Severe protein calorie malnutrition. - lipase only 78 now >> was 1,797 during last admission at [facility] - prn zofran for nausea  Anemia of critical illness and chronic disease. - f/u CBC  ?Right sided weakness: difficult to assess given MS, but family reports acute onset RUE weakness 10/26. CT head with subacute infarct on  admit. Consider repeat CVA.  -MRI brain -PT/OT/SLP  DVT prophylaxis - SQ heparin SUP - not indicated Diet - NPO Goals of care - full code  Ralene Ok, MD PGY 2, Family Medicine  Attending Note:  46 year old with multiple medical problems related to diabetes and lack of compliance.  Multiple CVAs that has been on anti-coagulation presenting with DKA.  The patient evidently lost the ability to move her right arm 6 days prior when she was in [facility].  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  Patient is not non-verbal and grimaces to pain.  On exam, not moving right side (twitching only).  I reviewed CXR myself, no acute disease noted.  Spoke with neurology.  Suggested MRI which was ordered.  Will intubate now and sedate see if the twitching stops and send down to MRI.  Will give versed PRN for withdrawal.  If MRI is abnormal will call neurology for a formal consult.  Hold in the ICU.  The patient is critically ill  with multiple organ systems failure and requires high complexity decision making for assessment and support, frequent evaluation and titration of therapies, application of advanced monitoring technologies and extensive interpretation of multiple databases.   Critical Care Time devoted to patient care services described in this note is  35  Minutes. This time reflects time of care of this signee Dr Jennet Maduro. This critical care time does not reflect procedure time, or teaching time or supervisory time of PA/NP/Med student/Med Resident etc but could involve care discussion time.  Rush Farmer, M.D. [facility] Pulmonary/Critical Care Medicine. Pager: [phone]. After hours pager: [phone].

## 2016-11-18 NOTE — Procedures (Signed)
ELECTROENCEPHALOGRAM REPORT  Date of Study: 11/18/2016  Patient's Name: [name] MRN: [id number] Date of Birth: 04-06-1970  Referring Provider: Dr. Tera Partridge  Clinical History: This is a 46 year old woman with altered mental status, severe metabolic acidosis with DKA.  Medications: insulin regular (NOVOLIN R,HUMULIN R) 100 Units in sodium chloride 0.9 % 100 mL (1 Units/mL) infusion  lactated ringers infusion  ondansetron (ZOFRAN) injection 4 mg   Technical Summary: A multichannel digital EEG recording measured by the international 10-20 system with electrodes applied with paste and impedances below 5000 ohms performed as portable with EKG monitoring in an awake and unresponsive patient.  Hyperventilation and photic stimulation were not performed.  The digital EEG was referentially recorded, reformatted, and digitally filtered in a variety of bipolar and referential montages for optimal display.   Description: The patient is awake and unresponsive during the recording. There is no clear posterior dominant rhythm. The background is disorganized with a large amount of diffuse medium to high voltage 4-5 Hz theta and 2-3 Hz delta slowing, at times with triphasic-like appearance. Normal sleep architecture is not seen. Hyperventilation and photic stimulation were not performed. Intermittently throughout the study, she is noted to have right arm jerking with no associated epileptiform correlate seen.  There were no epileptiform discharges or electrographic seizures seen.    EKG lead showed sinus tachycardia.  Impression: This awake EEG is abnormal due to the presence of moderate diffuse background slowing.  Clinical Correlation of the above findings indicates diffuse cerebral dysfunction that is non-specific in etiology and can be seen with hypoxic/ischemic injury, toxic/metabolic encephalopathies, or medication effect.  The absence of epileptiform discharges does not rule out a  clinical diagnosis of epilepsy.  Clinical correlation is advised.   Ellouise Newer, M.D.

## 2016-11-19 ENCOUNTER — Inpatient Hospital Stay (HOSPITAL_COMMUNITY): Payer: Medicaid Other

## 2016-11-19 DIAGNOSIS — I6340 Cerebral infarction due to embolism of unspecified cerebral artery: Secondary | ICD-10-CM | POA: Insufficient documentation

## 2016-11-19 DIAGNOSIS — I639 Cerebral infarction, unspecified: Secondary | ICD-10-CM

## 2016-11-19 DIAGNOSIS — Z515 Encounter for palliative care: Secondary | ICD-10-CM

## 2016-11-19 DIAGNOSIS — I63512 Cerebral infarction due to unspecified occlusion or stenosis of left middle cerebral artery: Secondary | ICD-10-CM

## 2016-11-19 LAB — GLUCOSE, CAPILLARY
Glucose-Capillary: 115 mg/dL — ABNORMAL HIGH (ref 65–99)
Glucose-Capillary: 116 mg/dL — ABNORMAL HIGH (ref 65–99)
Glucose-Capillary: 122 mg/dL — ABNORMAL HIGH (ref 65–99)
Glucose-Capillary: 128 mg/dL — ABNORMAL HIGH (ref 65–99)
Glucose-Capillary: 154 mg/dL — ABNORMAL HIGH (ref 65–99)
Glucose-Capillary: 194 mg/dL — ABNORMAL HIGH (ref 65–99)
Glucose-Capillary: 41 mg/dL — CL (ref 65–99)
Glucose-Capillary: 61 mg/dL — ABNORMAL LOW (ref 65–99)
Glucose-Capillary: 87 mg/dL (ref 65–99)

## 2016-11-19 LAB — BLOOD GAS, ARTERIAL
Acid-base deficit: 4.5 mmol/L — ABNORMAL HIGH (ref 0.0–2.0)
Bicarbonate: 19.5 mmol/L — ABNORMAL LOW (ref 20.0–28.0)
FIO2: 40
MECHVT: 460 mL
O2 Saturation: 98.2 %
PEEP: 5 cmH2O
Patient temperature: 100.4
RATE: 18 resp/min
pCO2 arterial: 33.7 mmHg (ref 32.0–48.0)
pH, Arterial: 7.385 (ref 7.350–7.450)
pO2, Arterial: 123 mmHg — ABNORMAL HIGH (ref 83.0–108.0)

## 2016-11-19 LAB — BASIC METABOLIC PANEL
Anion gap: 7 (ref 5–15)
BUN: 25 mg/dL — ABNORMAL HIGH (ref 6–20)
CO2: 19 mmol/L — ABNORMAL LOW (ref 22–32)
Calcium: 8 mg/dL — ABNORMAL LOW (ref 8.9–10.3)
Chloride: 118 mmol/L — ABNORMAL HIGH (ref 101–111)
Creatinine, Ser: 1.27 mg/dL — ABNORMAL HIGH (ref 0.44–1.00)
GFR calc Af Amer: 57 mL/min — ABNORMAL LOW (ref >60)
GFR calc non Af Amer: 49 mL/min — ABNORMAL LOW (ref >60)
Glucose, Bld: 121 mg/dL — ABNORMAL HIGH (ref 65–99)
Potassium: 3.8 mmol/L (ref 3.5–5.1)
Sodium: 144 mmol/L (ref 135–145)

## 2016-11-19 LAB — URINE CULTURE: Culture: >=100000 — AB

## 2016-11-19 LAB — MAGNESIUM: Magnesium: 1.8 mg/dL (ref 1.7–2.4)

## 2016-11-19 LAB — CBC
HCT: 30 % — ABNORMAL LOW (ref 36.0–46.0)
Hemoglobin: 10.2 g/dL — ABNORMAL LOW (ref 12.0–15.0)
MCH: 27.6 pg (ref 26.0–34.0)
MCHC: 34 g/dL (ref 30.0–36.0)
MCV: 81.1 fL (ref 78.0–100.0)
Platelets: 524 10*3/uL — ABNORMAL HIGH (ref 150–400)
RBC: 3.7 MIL/uL — ABNORMAL LOW (ref 3.87–5.11)
RDW: 14.3 % (ref 11.5–15.5)
WBC: 19 10*3/uL — ABNORMAL HIGH (ref 4.0–10.5)

## 2016-11-19 LAB — PHOSPHORUS: Phosphorus: 3.5 mg/dL (ref 2.5–4.6)

## 2016-11-19 LAB — PROCALCITONIN: Procalcitonin: 0.32 ng/mL

## 2016-11-19 MED ORDER — LORAZEPAM 2 MG/ML IJ SOLN
0.5000 mg | INTRAMUSCULAR | Status: DC | PRN
  Administered 2016-11-20 – 2016-11-23 (×6): 1 mg via INTRAVENOUS
  Filled 2016-11-19 (×6): qty 1

## 2016-11-19 MED ORDER — DEXTROSE 50 % IV SOLN
INTRAVENOUS | Status: AC
  Filled 2016-11-19: qty 50

## 2016-11-19 MED ORDER — FLUCONAZOLE 100MG IVPB
100.0000 mg | INTRAVENOUS | Status: DC
  Administered 2016-11-19 – 2016-11-22 (×4): 100 mg via INTRAVENOUS
  Filled 2016-11-19 (×4): qty 50

## 2016-11-19 MED ORDER — DEXTROSE 50 % IV SOLN
50.0000 mL | Freq: Once | INTRAVENOUS | Status: AC
  Administered 2016-11-19: 50 mL via INTRAVENOUS

## 2016-11-19 MED ORDER — INSULIN ASPART 100 UNIT/ML ~~LOC~~ SOLN
0.0000 [IU] | SUBCUTANEOUS | Status: DC
  Administered 2016-11-19: 1 [IU] via SUBCUTANEOUS
  Administered 2016-11-20: 5 [IU] via SUBCUTANEOUS
  Administered 2016-11-20: 7 [IU] via SUBCUTANEOUS
  Administered 2016-11-20: 2 [IU] via SUBCUTANEOUS
  Administered 2016-11-20: 3 [IU] via SUBCUTANEOUS
  Administered 2016-11-21: 2 [IU] via SUBCUTANEOUS
  Administered 2016-11-21: 3 [IU] via SUBCUTANEOUS
  Administered 2016-11-21: 1 [IU] via SUBCUTANEOUS
  Administered 2016-11-21 (×2): 3 [IU] via SUBCUTANEOUS
  Administered 2016-11-21: 5 [IU] via SUBCUTANEOUS
  Administered 2016-11-22 (×2): 3 [IU] via SUBCUTANEOUS
  Administered 2016-11-22: 2 [IU] via SUBCUTANEOUS
  Administered 2016-11-22: 3 [IU] via SUBCUTANEOUS
  Administered 2016-11-22: 2 [IU] via SUBCUTANEOUS
  Administered 2016-11-23 (×2): 5 [IU] via SUBCUTANEOUS
  Administered 2016-11-23 (×2): 2 [IU] via SUBCUTANEOUS
  Administered 2016-11-24: 1 [IU] via SUBCUTANEOUS
  Administered 2016-11-24: 2 [IU] via SUBCUTANEOUS
  Administered 2016-11-24: 1 [IU] via SUBCUTANEOUS

## 2016-11-19 MED ORDER — DEXTROSE 50 % IV SOLN
INTRAVENOUS | Status: AC
  Administered 2016-11-19: 50 mL
  Filled 2016-11-19: qty 50

## 2016-11-19 MED ORDER — ACETAMINOPHEN 650 MG RE SUPP
650.0000 mg | RECTAL | Status: DC | PRN
  Administered 2016-11-19 – 2016-11-21 (×2): 650 mg via RECTAL
  Filled 2016-11-19 (×3): qty 1

## 2016-11-19 MED ORDER — ASPIRIN EC 325 MG PO TBEC
325.0000 mg | DELAYED_RELEASE_TABLET | Freq: Every day | ORAL | Status: DC
  Administered 2016-11-22: 325 mg via ORAL
  Filled 2016-11-19 (×4): qty 1

## 2016-11-19 MED ORDER — INSULIN GLARGINE 100 UNIT/ML ~~LOC~~ SOLN
5.0000 [IU] | Freq: Every day | SUBCUTANEOUS | Status: DC
  Administered 2016-11-20: 5 [IU] via SUBCUTANEOUS
  Filled 2016-11-19: qty 0.05

## 2016-11-19 MED ORDER — ASPIRIN 300 MG RE SUPP
300.0000 mg | Freq: Every day | RECTAL | Status: DC
  Administered 2016-11-20 – 2016-11-21 (×2): 300 mg via RECTAL
  Filled 2016-11-19 (×5): qty 1

## 2016-11-19 NOTE — Progress Notes (Addendum)
Hypoglycemic Event  CBG: 61  Treatment: D50 IV 25 mL  Symptoms: None  Follow-up CBG: [id number] CBG Result:128  Possible Reasons for Event: Inadequate meal intake  Comments/MD notified:CCM MD, orders received

## 2016-11-19 NOTE — Procedures (Signed)
Extubation Procedure Note  Patient Details:   Name: [name] DOB: 12-25-1970 MRN: [id number]   Airway Documentation:     Evaluation  O2 sats: stable throughout Complications: No apparent complications Patient did tolerate procedure well. Bilateral Breath Sounds: Clear, Diminished   No   Pt extubated to 2L [place] per MD order. Pt stable throughout with no complications. Pt unable/unwilling to speak post extubation but does have a strong productive/congested cough but due to AMS unable/unwilling to spit out secretions. RN at bedside. RT will continue to monitor.   Laymond Purser M 11/19/2016, 10:02 AM

## 2016-11-19 NOTE — Progress Notes (Signed)
Results for ROLINDA, IMPSON (MRN [id number]) as of 11/19/2016 10:19  Ref. Range 11/19/2016 00:13 11/19/2016 00:33 11/19/2016 04:04 11/19/2016 07:20 11/19/2016 08:23  Glucose-Capillary Latest Ref Range: 65 - 99 mg/dL 41 (LL) 154 (H) 87 61 (L) 128 (H)  Noted that blood sugars were less than 70 mg/dl.  Recommend decreasing Lantus to 10-12 units daily and decreasing Novolog correction scale to SENSITIVE. Will continue to monitor blood sugars while in the hospital.   Harvel Ricks RN BSN CDE Diabetes Coordinator Pager: [phone]  8am-5pm

## 2016-11-19 NOTE — Progress Notes (Signed)
PULMONARY / CRITICAL CARE MEDICINE   Name: [name] MRN: [id number] DOB: [birth year]    ADMISSION DATE:  11/17/2016  REFERRING MD:  Dr. Venora Maples, ER  CHIEF COMPLAINT:  Altered mental status  HISTORY OF PRESENT ILLNESS:   Hx from ER staff, chart, and family.  46 yo female at [facility] from 10/20 to 10/27 with acute pancreatitis and delirium.  No clear cause of her pancreatitis was found.  After discharge home, the family reports that [name] was not compliant with her diabetes regimen.  She didn't have a means to test her blood sugars, and would sporadically use her insulin.  She became progressively more short of breath and nauseous over the past 2 days.  She was also getting dry heaves.  She started getting more confused.  She was brought to ER and found to have severe metabolic acidosis with hyperglycemia consistent with DKA.  SUBJECTIVE:   Patient opens eyes to exam, sedated.   VITAL SIGNS: BP 116/67   Pulse 83   Temp (!) 100.8 F (38.2 C)   Resp 18   Ht 5\' 5"  (1.651 m)   Wt 88 lb 6.5 oz (40.1 kg)   LMP 01/12/2011   SpO2 100%   BMI 14.71 kg/m   INTAKE / OUTPUT: I/O last 3 completed shifts: In: 6018 [I.V.:5698; NG/GT:60; IV Piggyback:260] Out: 1245 [Urine:1225; Emesis/NG output:100]  PHYSICAL EXAMINATION: General - somnolent Eyes - pupils reactive ENT - dry mucosa, no LAD Cardiac - regular, tachycardic, no murmur Chest - no wheeze, rales, easy WOB Abd - soft, non tender Ext - no edema, Lt BKA. Fine tremor of RUE.  Skin - no rashes Neuro - opens eyes with stimulation, not following commands, ?RUE weakness.   LABS:  BMET  Recent Labs Lab 11/18/16 0459 11/18/16 1246 11/19/16 0235  NA 145 144 144  K 4.7 5.2* 3.8  CL 119* 120* 118*  CO2 14* 14* 19*  BUN 31* 31* 25*  CREATININE 1.54* 1.47* 1.27*  GLUCOSE 153* 136* 121*   Electrolytes  Recent Labs Lab 11/17/16 1706  11/18/16 0459 11/18/16 1246 11/19/16 0235  CALCIUM 7.4*  < > 7.6* 7.9* 8.0*  MG  2.2  --  1.9  --  1.8  PHOS 3.3  --  1.9*  --  3.5  < > = values in this interval not displayed. CBC  Recent Labs Lab 11/17/16 1103 11/18/16 0459 11/19/16 0235  WBC 24.3* 23.6* 19.0*  HGB 9.2* 9.9* 10.2*  HCT 33.2* 27.7* 30.0*  PLT 630* 517* 524*   Coag's No results for input(s): APTT, INR in the last 168 hours.  Sepsis Markers  Recent Labs Lab 11/17/16 1316 11/17/16 1737 11/18/16 1246 11/19/16 0235  LATICACIDVEN 2.93* 2.40*  --   --   PROCALCITON  --   --  0.60 0.32   ABG  Recent Labs Lab 11/17/16 1100 11/17/16 1530 11/19/16 0440  PHART 6.965* 7.122* 7.385  PCO2ART BELOW REPORTABLE RANGE BELOW REPORTABLE RANGE 33.7  PO2ART 156* 113* 123*   Liver Enzymes  Recent Labs Lab 11/17/16 1103  AST 45*  ALT 12*  ALKPHOS 110  BILITOT 1.8*  ALBUMIN 2.0*   Cardiac Enzymes No results for input(s): TROPONINI, PROBNP in the last 168 hours.  Glucose  Recent Labs Lab 11/18/16 1519 11/18/16 2033 11/19/16 0013 11/19/16 0033 11/19/16 0404 11/19/16 0720  GLUCAP 196* 133* 41* 154* 87 61*   Imaging Mr Mra Head Wo Contrast  Result Date: 11/18/2016 CLINICAL DATA:  47 y/o  F; a mobile right arm. EXAM: MRI HEAD WITHOUT AND WITH CONTRAST MRA HEAD WITHOUT CONTRAST TECHNIQUE: Multiplanar, multiecho pulse sequences of the brain and surrounding structures were obtained without and with intravenous contrast. Angiographic images of the head were obtained using MRA technique without contrast. CONTRAST:  54mL MULTIHANCE GADOBENATE DIMEGLUMINE 529 MG/ML IV SOLN COMPARISON:  11/17/2016 CT of the head. 04/11/2016 MRI and MRA of the head. FINDINGS: MRI HEAD FINDINGS Brain: Large area of reduced diffusion involving the left lateral frontal lobe, second large area of reduced diffusion involving the left posterior temporal, parietal, and occipital lobes. Small foci of reduced diffusion are present within left caudate head, putamen, and scattered within the left anterolateral frontal and  temporal lobes. The areas of reduced diffusion demonstrate T2 FLAIR hyperintensity and mild local mass effect. Findings likely represent late acute to early subacute infarctions. There is incomplete mild enhancement of the infarctions. No abnormal susceptibility hypointensity to indicate acute intracranial hemorrhage. Small chronic cortical infarctions are present in the right superomedial frontal lobe, right lateral parietal lobe, right inferior cerebellar hemisphere, and the right occipital lobe. There is minimal hemosiderin deposition within the right superomedial frontal lobe chronic infarction. Mass effect from infarcts within the left hemisphere results in partial effacement of the left lateral ventricle and 3 mm of left-to-right midline shift. No herniation. Vascular: Loss of flow void within the left internal carotid artery compatible with occlusion. Skull and upper cervical spine: Normal marrow signal. Sinuses/Orbits: Negative. Other: None. MRA HEAD FINDINGS Motion degraded study. Suboptimal evaluation for subtle stenosis or small aneurysm. Internal carotid arteries: Patent right intracranial ICA. No flow related signal within the left ICA upper cervical, petrous, and cavernous segments. Faint flow is present within the left ICA at the ophthalmic and paraclinoid segments with reconstitution of flow with the terminus. Anterior cerebral arteries: Patent. Large right A1, diminutive left A1, and large anterior communicating artery, normal variant, stable. Middle cerebral arteries: Patent right middle cerebral artery and distal circulation. Patent left M1 with diminished flow related signal in the left MCA distribution. Anterior communicating artery: Patent. Posterior communicating arteries: Large right posterior communicating artery. Small left posterior communicating artery partially obscured by motion artifact. Posterior cerebral arteries:  Patent. Basilar artery:  Patent. Vertebral arteries:  Patent.  Left  dominant IMPRESSION: 1. Large areas of late acute/early subacute infarction within the left MCA distribution. No associated hemorrhage. Mild mass effect with partial effacement of left lateral ventricle and 3 mm left-to-right midline shift. 2. Left internal carotid artery occlusion with partial reconstitution of flow the level of the terminus. Asymmetric diminished flow related signal in left MCA distribution in comparison with right. 3. Multiple chronic infarctions as above. These results will be called to the ordering clinician or representative by the Radiologist Assistant, and communication documented in the PACS or zVision Dashboard. Electronically Signed   By: Kristine Garbe M.D.   On: 11/18/2016 18:46   Mr Jeri Cos TD Contrast  Result Date: 11/18/2016 CLINICAL DATA:  46 y/o  F; a mobile right arm. EXAM: MRI HEAD WITHOUT AND WITH CONTRAST MRA HEAD WITHOUT CONTRAST TECHNIQUE: Multiplanar, multiecho pulse sequences of the brain and surrounding structures were obtained without and with intravenous contrast. Angiographic images of the head were obtained using MRA technique without contrast. CONTRAST:  52mL MULTIHANCE GADOBENATE DIMEGLUMINE 529 MG/ML IV SOLN COMPARISON:  11/17/2016 CT of the head. 04/11/2016 MRI and MRA of the head. FINDINGS: MRI HEAD FINDINGS Brain: Large area of reduced diffusion involving the left lateral frontal lobe,  second large area of reduced diffusion involving the left posterior temporal, parietal, and occipital lobes. Small foci of reduced diffusion are present within left caudate head, putamen, and scattered within the left anterolateral frontal and temporal lobes. The areas of reduced diffusion demonstrate T2 FLAIR hyperintensity and mild local mass effect. Findings likely represent late acute to early subacute infarctions. There is incomplete mild enhancement of the infarctions. No abnormal susceptibility hypointensity to indicate acute intracranial hemorrhage. Small  chronic cortical infarctions are present in the right superomedial frontal lobe, right lateral parietal lobe, right inferior cerebellar hemisphere, and the right occipital lobe. There is minimal hemosiderin deposition within the right superomedial frontal lobe chronic infarction. Mass effect from infarcts within the left hemisphere results in partial effacement of the left lateral ventricle and 3 mm of left-to-right midline shift. No herniation. Vascular: Loss of flow void within the left internal carotid artery compatible with occlusion. Skull and upper cervical spine: Normal marrow signal. Sinuses/Orbits: Negative. Other: None. MRA HEAD FINDINGS Motion degraded study. Suboptimal evaluation for subtle stenosis or small aneurysm. Internal carotid arteries: Patent right intracranial ICA. No flow related signal within the left ICA upper cervical, petrous, and cavernous segments. Faint flow is present within the left ICA at the ophthalmic and paraclinoid segments with reconstitution of flow with the terminus. Anterior cerebral arteries: Patent. Large right A1, diminutive left A1, and large anterior communicating artery, normal variant, stable. Middle cerebral arteries: Patent right middle cerebral artery and distal circulation. Patent left M1 with diminished flow related signal in the left MCA distribution. Anterior communicating artery: Patent. Posterior communicating arteries: Large right posterior communicating artery. Small left posterior communicating artery partially obscured by motion artifact. Posterior cerebral arteries:  Patent. Basilar artery:  Patent. Vertebral arteries:  Patent.  Left dominant IMPRESSION: 1. Large areas of late acute/early subacute infarction within the left MCA distribution. No associated hemorrhage. Mild mass effect with partial effacement of left lateral ventricle and 3 mm left-to-right midline shift. 2. Left internal carotid artery occlusion with partial reconstitution of flow the  level of the terminus. Asymmetric diminished flow related signal in left MCA distribution in comparison with right. 3. Multiple chronic infarctions as above. These results will be called to the ordering clinician or representative by the Radiologist Assistant, and communication documented in the PACS or zVision Dashboard. Electronically Signed   By: Kristine Garbe M.D.   On: 11/18/2016 18:46   Dg Chest Port 1 View  Result Date: 11/19/2016 CLINICAL DATA:  Intubation. EXAM: PORTABLE CHEST 1 VIEW COMPARISON:  11/18/2016 . FINDINGS: Endotracheal tube and NG tube in stable position. Heart size normal. No focal infiltrate. No pleural effusion or pneumothorax. IMPRESSION: 1. Lines and tubes in stable position. 2. No acute cardiopulmonary disease . Chest is stable from prior exam. Electronically Signed   By: Marcello Moores  Register   On: 11/19/2016 07:38   Dg Chest Port 1 View  Result Date: 11/18/2016 CLINICAL DATA:  Intubated patient, acute respiratory failure, diabetes, current smoker. EXAM: PORTABLE CHEST 1 VIEW COMPARISON:  Portable chest x-ray of November 17, 2016 FINDINGS: An endotracheal tube is been placed whose tip projects 3.4 cm above the carina. The esophagogastric tube tip and proximal port project below the GE junction. The lungs are well-expanded. There is no focal infiltrate. There is no pleural effusion or pneumothorax. The heart and pulmonary vascularity are normal. The bony thorax is unremarkable. IMPRESSION: Interval intubation of the trachea and esophagus with reasonable positioning of the tubes. Clear lungs. No CHF. Electronically Signed  By: David  Martinique M.D.   On: 11/18/2016 15:06   STUDIES:  CT head 10/31 >> Old Rt frontal infarct, Age indeterminate infarct Lt parietal lobe, chronic microvascular disease, cerebral atrophy  SIGNIFICANT EVENTS: 10/31 Admit 11/1 BG improving, notable RUE weakness and tremor, plan for MRI brain looking for CVA vs structural causes for  tremor  DISCUSSION: 46 yo female with DKA with coma likely related to medical non compliance.  ASSESSMENT / PLAN:  DKA, resolved. Poor compliance with insulin regimen. ON with hypoglycemia, given D50.  - Lantus 15U daily - moderate SSI   Hypokalemia in setting of DKA. Mg and Phos appropriate. S/p 9 runs IV K. K 4.7 11/1.  - monitor heart rhythm - f/u BMET q4h  Acute renal failure with ATN >> baseline creatinine 0.72 from 11/08/16. Continues to improve.  - continue volume resuscitation  Large MCA infarct: given RUE weakness and AMS, suspected new CVA based on CT head from ED. MRI confirmed large MCA infarct.  -consult stroke team - PT/OT/SLP - hold outpt xanax, percocet, neurontin - monitor for opioid withdrawal  Hx of CAD s/p stenting. Hx of thromboembolism, HLD, PAD. - hold outpt eliquis for now  Recent episode of pancreatitis. Severe protein calorie malnutrition. - lipase only 78 now >> was 1,797 during last admission at [facility] - prn zofran for nausea  Anemia of critical illness and chronic disease. - f/u CBC  DVT prophylaxis - SQ heparin SUP - not indicated Diet - NPO Goals of care - full code  Ralene Ok, MD PGY 2, Family Medicine  Attending Note:  46 year old female with poorly controlled diabetes type I presenting with DKA.  Patient was not moving the right side on exam and MRI that I reviewed myself that revealed L MCA CVA that is subacute likely from last Friday when family noticed she was not moving the right side.  Patient is in a very poor condition overall.  Spoke with father and step mother, informed them that I am concerned about her overall prognosis given how poorly controlled the DM has been and all its effects.  Will proceed with extubation today, continue full code as she lives with her daughter and they would like her daughter to be the one making decisions about code status, palliative care consult requested.  Hold in the ICU post  extubation.  The patient is critically ill with multiple organ systems failure and requires high complexity decision making for assessment and support, frequent evaluation and titration of therapies, application of advanced monitoring technologies and extensive interpretation of multiple databases.   Critical Care Time devoted to patient care services described in this note is  35  Minutes. This time reflects time of care of this signee Dr Jennet Maduro. This critical care time does not reflect procedure time, or teaching time or supervisory time of PA/NP/Med student/Med Resident etc but could involve care discussion time.  Rush Farmer, M.D. [facility] Pulmonary/Critical Care Medicine. Pager: [phone]. After hours pager: [phone].

## 2016-11-19 NOTE — Consult Note (Addendum)
Requesting Physician: Dr. Nelda Marseille    Chief Complaint: Stroke  History obtained from:  Chart    HPI:                                                                                                                                         [name] is an 46 y.o. female (as stated the majority of the history is obtained from chart as patient is not able to give a history at this time.)  "Patient was transferred from [facility] from 10/20 to 10/27 with acute pancreatitis and delirium.  No clear cause of her pancreatitis was found.  After discharge home, the family reports that [name] was not compliant with her diabetes regimen.  She didn't have a means to test her blood sugars, and would sporadically use her insulin.  She became progressively more short of breath and nauseous over the past 2 days.  She was also getting dry heaves.  She started getting more confused.  She was brought to ER and found to have severe metabolic acidosis with hyperglycemia consistent with DKA."Well in the ICU patient was noted to have a right arm tremor.  EEG was ordered and did not show any correlate of seizure activity.  MRI was obtained and did show a left MCA infarct that is acute/subacute.  At this time it is both in the DWI/ADC/and T2.  Exam correlates with a left MCA stroke.  In addition patient is also withdrawing from Xanax.  Her father and stepmother were at bedside.  They reported that the patient was admitted any pain as mentioned above from the H&P, she started developing some tremors or abnormal movements of her right side while at [facility] prior to discharge.  She was able to speak and talk normally upon discharge.   Date last known well: Unable to determine -at least 5 days ago Time last known well: Unable to determine tPA Given: No: Out of the window for TPA Modified Rankin: Rankin Score=0 NIH stroke scale of 7  Past Medical History:  Diagnosis Date  . Anxiety   . 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 Coronary atherosclerosis of native coronary artery    DES to LAD September 2014  . Depression   . Facial cellulitis    12/2010  . Glomerulonephritis   . Headache(784.0)   . History of stroke   . Hyperlipidemia   . Insulin dependent diabetes mellitus ([place])    History of diabetic ketoacidosis  . Ischemic cardiomyopathy    LVEF 40-45%  . Peripheral arterial disease ([place])   . Shingles    11/2010  . ST elevation myocardial infarction (STEMI) of anterior wall [facility])    Late presentation September 2014    Past Surgical History:  Procedure Laterality Date  . AMPUTATION  03/03/2011   Procedure: AMPUTATION DIGIT;  Surgeon:  Newt Minion, MD;  Location: [place];  Service: Orthopedics;  Laterality: Left;  Left Foot Amputation 4th and 5th toes at MTP joint  . AMPUTATION  04/22/2011   Procedure: AMPUTATION BELOW KNEE;  Surgeon: Newt Minion, MD;  Location: [place];  Service: Orthopedics;  Laterality: Left;  Left Below Knee Amputation  . DILATION AND CURETTAGE OF UTERUS    . EMBOLECTOMY  01/29/2011   Procedure: EMBOLECTOMY;  Surgeon: Mal Misty, MD;  Location: [facility] OR;  Service: Vascular;  Laterality: Left;  Left Popliteal and Tibial Embolectomy with patch angioplasty  . INCISION AND DRAINAGE ABSCESS N/A 04/06/2016   Procedure: INCISION AND DRAINAGE ABSCESS;  Surgeon: Jonnie Kind, MD;  Location: [facility];  Service: Gynecology;  Laterality: N/A;  . LEFT HEART CATHETERIZATION WITH CORONARY ANGIOGRAM N/A 10/01/2012   Procedure: LEFT HEART CATHETERIZATION WITH CORONARY ANGIOGRAM;  Surgeon: Peter M Martinique, MD;  Location: [facility] CATH LAB;  Service: Cardiovascular;  Laterality: N/A;  . LOWER EXTREMITY ANGIOGRAM N/A 01/27/2011   Procedure: LOWER EXTREMITY ANGIOGRAM;  Surgeon: Rosetta Posner, MD;  Location: [facility] CATH LAB;  Service: Cardiovascular;  Laterality: N/A;  . LOWER EXTREMITY ANGIOGRAM N/A 01/28/2011   Procedure: LOWER EXTREMITY ANGIOGRAM;  Surgeon: Conrad Montrose, MD;  Location: [facility] CATH LAB;  Service: Cardiovascular;  Laterality: N/A;  . LOWER EXTREMITY ANGIOGRAM Left 01/29/2011   Procedure: LOWER EXTREMITY ANGIOGRAM;  Surgeon: Mal Misty, MD;  Location: [facility] CATH LAB;  Service: Cardiovascular;  Laterality: Left;  Marland Kitchen MULTIPLE TOOTH EXTRACTIONS    . TEE WITHOUT CARDIOVERSION  04/15/2011   Procedure: TRANSESOPHAGEAL ECHOCARDIOGRAM (TEE);  Surgeon: [name], MD;  [facility];  Service: Cardiovascular;  Laterality: N/A;  . WRIST SURGERY     Left    Family History  Problem Relation Age of Onset  . COPD Mother        alive - 50  . Hypertension Mother   . Diabetes Mother   . Stroke Mother   . Coronary artery disease Mother   . Diabetes Father        alive - 52  . Hypertension Father   . Coronary artery disease Father   . Anesthesia problems Neg Hx    Social History:  reports that she has been smoking Cigarettes.  She started smoking about 27 years ago. She has a 10.50 pack-year smoking history. She has never used smokeless tobacco. She reports that she uses drugs, including Marijuana, about 1 time per week. She reports that she does not drink alcohol.  Allergies:  Allergies  Allergen Reactions  . Ibuprofen Other (See Comments)    Kidney disease  . Losartan Swelling    Medications:                                                                                                                           Prior to Admission:  Prescriptions Prior to Admission  Medication Sig Dispense Refill Last  Dose  . ALPRAZolam (XANAX) 1 MG tablet Take 1 mg by mouth 3 (three) times daily.    unknown at prn  . apixaban (ELIQUIS) 5 MG TABS tablet Take 5 mg by mouth 2 (two) times daily.   unknown  . gabapentin (NEURONTIN) 300 MG capsule Take 300 mg by mouth at bedtime.     . insulin aspart (NOVOLOG FLEXPEN) 100 UNIT/ML injection Inject 2-10 Units into the skin 3 (three) times daily before meals. Sliding scale as follows 200-250=2  units 251-300=4 units 301-350=6 units 351-400=8 units >400-give 10 units and Notify MD   unknown at prn  . Insulin Glargine (LANTUS SOLOSTAR) 100 UNIT/ML SOPN Inject 15 Units into the skin at bedtime. (Patient taking differently: Inject 25 Units into the skin at bedtime. )   11/16/2016 at Unknown time  . nitroGLYCERIN (NITROSTAT) 0.4 MG SL tablet Place 1 tablet (0.4 mg total) under the tongue every 5 (five) minutes as needed for chest pain. 25 tablet 5 11/16/2016 at prn  . oxyCODONE-acetaminophen (PERCOCET) 10-325 MG tablet Take 1 tablet by mouth 4 (four) times daily. 120 tablet 0 11/16/2016 at prn  . promethazine (PHENERGAN) 12.5 MG tablet Take 12.5 mg by mouth every 4 (four) hours as needed for nausea or vomiting.    11/16/2016 at prn   Scheduled: . chlorhexidine gluconate (MEDLINE KIT)  15 mL Mouth Rinse BID  . fluconazole  100 mg Per Tube Daily  . heparin  5,000 Units Subcutaneous Q8H  . insulin aspart  0-15 Units Subcutaneous Q4H  . insulin glargine  15 Units Subcutaneous Daily  . mouth rinse  15 mL Mouth Rinse BID  . mouth rinse  15 mL Mouth Rinse QID   Continuous: . insulin (NOVOLIN-R) infusion Stopped (11/18/16 1525)  . lactated ringers 150 mL/hr at 11/19/16 0032   SHF:WYOVZCHY (SUBLIMAZE) injection, fentaNYL (SUBLIMAZE) injection, LORazepam, midazolam, ondansetron (ZOFRAN) IV  ROS:                                                                                                                                       History obtained from unobtainable from patient due to Patient is a phasic and not talking  Neurologic Examination:                                                                                                      Blood pressure 140/71, pulse 88, temperature (!) 101.3 F (38.5 C), resp. rate 17, height 5' 5" (1.651 m), weight 40.1 kg (  88 lb 6.5 oz), last menstrual period 01/12/2011, SpO2 100 %.  HEENT-  Normocephalic, no lesions, without obvious abnormality.   Normal external eye and conjunctiva.  Normal TM's bilaterally.  Normal auditory canals and external ears. Normal external nose, mucus membranes and septum.  Normal pharynx. Cardiovascular- S1, S2 normal, pulses palpable throughout   Lungs- chest clear, no wheezing, rales, normal symmetric air entry Abdomen- normal findings: bowel sounds normal Extremities- no edema Lymph-no adenopathy palpable Musculoskeletal-no joint tenderness, deformity or swelling Skin-warm and dry, no hyperpigmentation, vitiligo, or suspicious lesions  Neurological Examination Mental Status: Alert, currently nonverbal.  She does not follow commands.  Patient does wince to pain bilaterally. Cranial Nerves: II: Right hemianopsia III,IV, left gaze preference, pupils equal, round, reactive to light and accommodation V,VII: When patient grimaces she has notable right facial decreased movement, no grimace to pain on the right VIII: Opens eyes to voice Motor: Right : Upper extremity   0/5    Left:     Upper extremity   4/5  --Notable tremor of the right upper extremity but not the lower extremity.  Lower extremity   5/5     Lower extremity   5/5 Tone and bulk:normal tone throughout; no atrophy noted Sensory: Winces to pain in all 4 extremities Deep Tendon Reflexes: Tendon reflexes are depressed throughout  plantars: Right: downgoing   Left: Low knee amputation Cerebellar: Unable to perform as she does not understand my commands Gait: Not tested  Lab Results: Basic Metabolic Panel:  Recent Labs Lab 11/17/16 1706 11/17/16 2052 11/18/16 0032 11/18/16 0459 11/18/16 1246 11/19/16 0235  NA 139 144 144 145 144 144  K 3.1* 2.8* 3.0* 4.7 5.2* 3.8  CL 111 116* 119* 119* 120* 118*  CO2 <7* 14* 17* 14* 14* 19*  GLUCOSE 805* 500* 216* 153* 136* 121*  BUN 36* 34* 31* 31* 31* 25*  CREATININE 2.48* 2.20* 1.79* 1.54* 1.47* 1.27*  CALCIUM 7.4* 7.8* 7.8* 7.6* 7.9* 8.0*  MG 2.2  --   --  1.9  --  1.8  PHOS 3.3  --   --   1.9*  --  3.5    Liver Function Tests:  Recent Labs Lab 11/17/16 1103  AST 45*  ALT 12*  ALKPHOS 110  BILITOT 1.8*  PROT 5.2*  ALBUMIN 2.0*    Recent Labs Lab 11/17/16 1103  LIPASE 78*  CBC:  Recent Labs Lab 11/17/16 1103 11/18/16 0459 11/19/16 0235  WBC 24.3* 23.6* 19.0*  HGB 9.2* 9.9* 10.2*  HCT 33.2* 27.7* 30.0*  MCV 101.5* 80.3 81.1  PLT 630* 517* 524*   CBG:  Recent Labs Lab 11/19/16 0013 11/19/16 0033 11/19/16 0404 11/19/16 0720 11/19/16 0823  GLUCAP 41* 154* 87 61* 128*    Microbiology: Results for orders placed or performed during the hospital encounter of 11/17/16  MRSA PCR Screening     Status: None   Collection Time: 11/17/16  5:43 PM  Result Value Ref Range Status   MRSA by PCR NEGATIVE NEGATIVE Final    Comment:        The GeneXpert MRSA Assay (FDA approved for NASAL specimens only), is one component of a comprehensive MRSA colonization surveillance program. It is not intended to diagnose MRSA infection nor to guide or monitor treatment for MRSA infections.     Coagulation Studies: No results for input(s): LABPROT, INR in the last 72 hours.  Imaging: Ct Head Wo Contrast  Result Date: 11/17/2016 CLINICAL DATA:  Altered level of consciousness. EXAM:  CT HEAD WITHOUT CONTRAST TECHNIQUE: Contiguous axial images were obtained from the base of the skull through the vertex without intravenous contrast. COMPARISON:  09/30/2012 FINDINGS: Brain: Old infarcts in the right frontal lobe. Low-density noted in the left parietal lobe is new since 2014 compatible with age-indeterminate infarct. Chronic microvascular disease in the deep white matter. No hydrocephalus. Mild age advanced cerebral atrophy. Vascular: No hyperdense vessel or unexpected calcification. Skull: No acute calvarial abnormality. Sinuses/Orbits: Visualized paranasal sinuses and mastoids clear. Orbital soft tissues unremarkable. Other: None IMPRESSION: Old right frontal infarct. New  low-density scratched at low-density area in the left parietal lobe compatible with infarct, age indeterminate, new since 2014. Atrophy, chronic microvascular disease. Electronically Signed   By: Rolm Baptise M.D.   On: 11/17/2016 12:13   Mr [name] Head Wo Contrast  Result Date: 11/18/2016 CLINICAL DATA:  46 y/o  F; a mobile right arm. EXAM: MRI HEAD WITHOUT AND WITH CONTRAST MRA HEAD WITHOUT CONTRAST TECHNIQUE: Multiplanar, multiecho pulse sequences of the brain and surrounding structures were obtained without and with intravenous contrast. Angiographic images of the head were obtained using MRA technique without contrast. CONTRAST:  66m MULTIHANCE GADOBENATE DIMEGLUMINE 529 MG/ML IV SOLN COMPARISON:  11/17/2016 CT of the head. 04/11/2016 MRI and MRA of the head. FINDINGS: MRI HEAD FINDINGS Brain: Large area of reduced diffusion involving the left lateral frontal lobe, second large area of reduced diffusion involving the left posterior temporal, parietal, and occipital lobes. Small foci of reduced diffusion are present within left caudate head, putamen, and scattered within the left anterolateral frontal and temporal lobes. The areas of reduced diffusion demonstrate T2 FLAIR hyperintensity and mild local mass effect. Findings likely represent late acute to early subacute infarctions. There is incomplete mild enhancement of the infarctions. No abnormal susceptibility hypointensity to indicate acute intracranial hemorrhage. Small chronic cortical infarctions are present in the right superomedial frontal lobe, right lateral parietal lobe, right inferior cerebellar hemisphere, and the right occipital lobe. There is minimal hemosiderin deposition within the right superomedial frontal lobe chronic infarction. Mass effect from infarcts within the left hemisphere results in partial effacement of the left lateral ventricle and 3 mm of left-to-right midline shift. No herniation. Vascular: Loss of flow void within the left  internal carotid artery compatible with occlusion. Skull and upper cervical spine: Normal marrow signal. Sinuses/Orbits: Negative. Other: None. MRA HEAD FINDINGS Motion degraded study. Suboptimal evaluation for subtle stenosis or small aneurysm. Internal carotid arteries: Patent right intracranial ICA. No flow related signal within the left ICA upper cervical, petrous, and cavernous segments. Faint flow is present within the left ICA at the ophthalmic and paraclinoid segments with reconstitution of flow with the terminus. Anterior cerebral arteries: Patent. Large right A1, diminutive left A1, and large anterior communicating artery, normal variant, stable. Middle cerebral arteries: Patent right middle cerebral artery and distal circulation. Patent left M1 with diminished flow related signal in the left MCA distribution. Anterior communicating artery: Patent. Posterior communicating arteries: Large right posterior communicating artery. Small left posterior communicating artery partially obscured by motion artifact. Posterior cerebral arteries:  Patent. Basilar artery:  Patent. Vertebral arteries:  Patent.  Left dominant IMPRESSION: 1. Large areas of late acute/early subacute infarction within the left MCA distribution. No associated hemorrhage. Mild mass effect with partial effacement of left lateral ventricle and 3 mm left-to-right midline shift. 2. Left internal carotid artery occlusion with partial reconstitution of flow the level of the terminus. Asymmetric diminished flow related signal in left MCA distribution in comparison with  right. 3. Multiple chronic infarctions as above. These results will be called to the ordering clinician or representative by the Radiologist Assistant, and communication documented in the PACS or zVision Dashboard. Electronically Signed   By: Kristine Garbe M.D.   On: 11/18/2016 18:46   Mr Jeri Cos YP Contrast  Result Date: 11/18/2016 CLINICAL DATA:  46 y/o  F; a mobile  right arm. EXAM: MRI HEAD WITHOUT AND WITH CONTRAST MRA HEAD WITHOUT CONTRAST TECHNIQUE: Multiplanar, multiecho pulse sequences of the brain and surrounding structures were obtained without and with intravenous contrast. Angiographic images of the head were obtained using MRA technique without contrast. CONTRAST:  71m MULTIHANCE GADOBENATE DIMEGLUMINE 529 MG/ML IV SOLN COMPARISON:  11/17/2016 CT of the head. 04/11/2016 MRI and MRA of the head. FINDINGS: MRI HEAD FINDINGS Brain: Large area of reduced diffusion involving the left lateral frontal lobe, second large area of reduced diffusion involving the left posterior temporal, parietal, and occipital lobes. Small foci of reduced diffusion are present within left caudate head, putamen, and scattered within the left anterolateral frontal and temporal lobes. The areas of reduced diffusion demonstrate T2 FLAIR hyperintensity and mild local mass effect. Findings likely represent late acute to early subacute infarctions. There is incomplete mild enhancement of the infarctions. No abnormal susceptibility hypointensity to indicate acute intracranial hemorrhage. Small chronic cortical infarctions are present in the right superomedial frontal lobe, right lateral parietal lobe, right inferior cerebellar hemisphere, and the right occipital lobe. There is minimal hemosiderin deposition within the right superomedial frontal lobe chronic infarction. Mass effect from infarcts within the left hemisphere results in partial effacement of the left lateral ventricle and 3 mm of left-to-right midline shift. No herniation. Vascular: Loss of flow void within the left internal carotid artery compatible with occlusion. Skull and upper cervical spine: Normal marrow signal. Sinuses/Orbits: Negative. Other: None. MRA HEAD FINDINGS Motion degraded study. Suboptimal evaluation for subtle stenosis or small aneurysm. Internal carotid arteries: Patent right intracranial ICA. No flow related signal  within the left ICA upper cervical, petrous, and cavernous segments. Faint flow is present within the left ICA at the ophthalmic and paraclinoid segments with reconstitution of flow with the terminus. Anterior cerebral arteries: Patent. Large right A1, diminutive left A1, and large anterior communicating artery, normal variant, stable. Middle cerebral arteries: Patent right middle cerebral artery and distal circulation. Patent left M1 with diminished flow related signal in the left MCA distribution. Anterior communicating artery: Patent. Posterior communicating arteries: Large right posterior communicating artery. Small left posterior communicating artery partially obscured by motion artifact. Posterior cerebral arteries:  Patent. Basilar artery:  Patent. Vertebral arteries:  Patent.  Left dominant IMPRESSION: 1. Large areas of late acute/early subacute infarction within the left MCA distribution. No associated hemorrhage. Mild mass effect with partial effacement of left lateral ventricle and 3 mm left-to-right midline shift. 2. Left internal carotid artery occlusion with partial reconstitution of flow the level of the terminus. Asymmetric diminished flow related signal in left MCA distribution in comparison with right. 3. Multiple chronic infarctions as above. These results will be called to the ordering clinician or representative by the Radiologist Assistant, and communication documented in the PACS or zVision Dashboard. Electronically Signed   By: LKristine GarbeM.D.   On: 11/18/2016 18:46   Dg Chest Port 1 View  Result Date: 11/19/2016 CLINICAL DATA:  Intubation. EXAM: PORTABLE CHEST 1 VIEW COMPARISON:  11/18/2016 . FINDINGS: Endotracheal tube and NG tube in stable position. Heart size normal. No focal infiltrate. No pleural  effusion or pneumothorax. IMPRESSION: 1. Lines and tubes in stable position. 2. No acute cardiopulmonary disease . Chest is stable from prior exam. Electronically Signed   By:  Marcello Moores  Register   On: 11/19/2016 07:38   Dg Chest Port 1 View  Result Date: 11/18/2016 CLINICAL DATA:  Intubated patient, acute respiratory failure, diabetes, current smoker. EXAM: PORTABLE CHEST 1 VIEW COMPARISON:  Portable chest x-ray of November 17, 2016 FINDINGS: An endotracheal tube is been placed whose tip projects 3.4 cm above the carina. The esophagogastric tube tip and proximal port project below the GE junction. The lungs are well-expanded. There is no focal infiltrate. There is no pleural effusion or pneumothorax. The heart and pulmonary vascularity are normal. The bony thorax is unremarkable. IMPRESSION: Interval intubation of the trachea and esophagus with reasonable positioning of the tubes. Clear lungs. No CHF. Electronically Signed   By: David  Martinique M.D.   On: 11/18/2016 15:06   Dg Chest Portable 1 View  Result Date: 11/17/2016 CLINICAL DATA:  Unresponsive.  Hyperglycemia. EXAM: PORTABLE CHEST 1 VIEW COMPARISON:  January 07, 2013 FINDINGS: Lungs are clear. Heart size and pulmonary vascularity are normal. No adenopathy. There is an apparent coronary stent on the left. No evident bone lesions. No pneumothorax. IMPRESSION: No edema or consolidation. Electronically Signed   By: Lowella Grip III M.D.   On: 11/17/2016 11:10    Assessment and plan discussed with with attending physician and they are in agreement.    Etta Quill PA-C [facility] [phone]  11/19/2016, 8:54 AM  Attending Neurohospitalist Addendum Patient seen and examined. Agree with the history and physical as documented above. Agree with the plan as documented, which I helped formulate. I have independently obtaining history, and review of systems on my exam and the patient and reviewed the chart including pertinent labs and pertinent imaging.    Assessment: 46 y.o. 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  While in the hospital patient was noted to be in DKA and later noted to have less movement on the right along with a tremor in her right upper extremity.  For that reason neurology was consulted.  MRI did show a left MCA infarct. MRA of the head shows occlusion of the left internal carotid with partial reconstitution of flow at the level of the terminus.  Diminished flow in the left MCA distribution in comparison to the right.  Further investigation of history per the family, she was having trouble with her right side at least for the last 4-5 days.  Her speech was normal upon discharge from [facility].  They are not sure as to when she stopped talking, or if she had hemiparesis or hemiplegia.  Stroke Risk Factors - hyperlipidemia and hypertension Etiology of the stroke-most likely cardioembolic.  Recommend 1. HgbA1c, fasting lipid panel 3. PT consult, OT consult, Speech consult 4. Echocardiogram 5. 80 mg of Atorvistatin 6. Prophylactic therapy-Antiplatelet med: 81 mg aspirin daily.  We will leave restarting time for her Eliquis up to stroke team 7. Risk factor modification 8. Telemetry monitoring 9. Frequent neuro checks 10 NPO until passes stroke swallow screen 11 please page stroke NP  Or  PA  Or  MD from 8am -4 pm  as this patient from this time will be  followed by the stroke.   You can look them up on [email or web address]  Password [email or web address]   In addition, she does have multiple cerebrovascular risk factors, but due to the multiplicity of her arterial thrombotic events, consider evaluating for hypercoagulable states including genetic testing.  --- Amie Portland, MD [facility] [phone]  If 7pm to 7am, please call on call as listed on AMION.

## 2016-11-19 NOTE — Care Management Note (Signed)
Case Management Note  Patient Details  Name: [name] MRN: [id number] Date of Birth: 1970/06/20  Subjective/Objective:  Pt admitted with AMS and DKA                Action/Plan:  Pt recently discharged home with daughter.  Per family pt was not compliant at home post discharge.  Pt is now ventilated - CM will continue to follow for discharge needs.   Expected Discharge Date:                  Expected Discharge Plan:     In-House Referral:     Discharge planning Services  CM Consult  Post Acute Care Choice:    Choice offered to:     DME Arranged:    DME Agency:     HH Arranged:    HH Agency:     Status of Service:     If discussed at [employer], dates discussed:    Additional Comments:  Maryclare Labrador, RN 11/19/2016, 4:30 PM

## 2016-11-19 NOTE — Progress Notes (Signed)
Hypoglycemic Event  CBG: cbg-41  Treatment: D50 IV 50 mL  Symptoms: None  Follow-up CBG: Time:0032 CBG Result:154  Possible Reasons for Event: inadequate meal regimen, medication regimen  Comments/MD notified:Per protocol    [name]

## 2016-11-19 NOTE — Progress Notes (Signed)
eLink Physician-Brief Progress Note Patient Name: [name] DOB: [birth year] MRN: [id number]   Date of Service  11/19/2016  HPI/Events of Note  Camera check on patient postextubation. Resting comfortably. Eyes closed. Saturating normally with normal respiratory effort. No family at bedside.   eICU Interventions  Continuing close monitoring postextubation.      Intervention Category Major Interventions: Respiratory failure - evaluation and management  Tera Partridge 11/19/2016, 7:47 PM

## 2016-11-20 ENCOUNTER — Inpatient Hospital Stay (HOSPITAL_COMMUNITY): Payer: Medicaid Other

## 2016-11-20 DIAGNOSIS — E1011 Type 1 diabetes mellitus with ketoacidosis with coma: Secondary | ICD-10-CM

## 2016-11-20 DIAGNOSIS — I6310 Cerebral infarction due to embolism of unspecified precerebral artery: Secondary | ICD-10-CM

## 2016-11-20 DIAGNOSIS — N3000 Acute cystitis without hematuria: Secondary | ICD-10-CM

## 2016-11-20 DIAGNOSIS — E785 Hyperlipidemia, unspecified: Secondary | ICD-10-CM

## 2016-11-20 DIAGNOSIS — N179 Acute kidney failure, unspecified: Secondary | ICD-10-CM

## 2016-11-20 DIAGNOSIS — R1310 Dysphagia, unspecified: Secondary | ICD-10-CM

## 2016-11-20 DIAGNOSIS — Z515 Encounter for palliative care: Secondary | ICD-10-CM

## 2016-11-20 DIAGNOSIS — J9601 Acute respiratory failure with hypoxia: Secondary | ICD-10-CM

## 2016-11-20 DIAGNOSIS — I6522 Occlusion and stenosis of left carotid artery: Secondary | ICD-10-CM

## 2016-11-20 DIAGNOSIS — D6859 Other primary thrombophilia: Secondary | ICD-10-CM

## 2016-11-20 LAB — GLUCOSE, CAPILLARY
Glucose-Capillary: 310 mg/dL — ABNORMAL HIGH (ref 65–99)
Glucose-Capillary: 212 mg/dL — ABNORMAL HIGH (ref 65–99)
Glucose-Capillary: 254 mg/dL — ABNORMAL HIGH (ref 65–99)
Glucose-Capillary: 83 mg/dL (ref 65–99)
Glucose-Capillary: 69 mg/dL (ref 65–99)
Glucose-Capillary: 148 mg/dL — ABNORMAL HIGH (ref 65–99)

## 2016-11-20 LAB — BASIC METABOLIC PANEL
Anion gap: 13 (ref 5–15)
BUN: 18 mg/dL (ref 6–20)
CO2: 15 mmol/L — ABNORMAL LOW (ref 22–32)
Calcium: 7.9 mg/dL — ABNORMAL LOW (ref 8.9–10.3)
Chloride: 113 mmol/L — ABNORMAL HIGH (ref 101–111)
Creatinine, Ser: 1.1 mg/dL — ABNORMAL HIGH (ref 0.44–1.00)
GFR calc Af Amer: >60 mL/min (ref >60)
GFR calc non Af Amer: 59 mL/min — ABNORMAL LOW (ref >60)
Glucose, Bld: 336 mg/dL — ABNORMAL HIGH (ref 65–99)
Potassium: 4.8 mmol/L (ref 3.5–5.1)
Sodium: 141 mmol/L (ref 135–145)

## 2016-11-20 LAB — CBC
HCT: 29.4 % — ABNORMAL LOW (ref 36.0–46.0)
Hemoglobin: 9.6 g/dL — ABNORMAL LOW (ref 12.0–15.0)
MCH: 27.5 pg (ref 26.0–34.0)
MCHC: 32.7 g/dL (ref 30.0–36.0)
MCV: 84.2 fL (ref 78.0–100.0)
Platelets: 365 10*3/uL (ref 150–400)
RBC: 3.49 MIL/uL — ABNORMAL LOW (ref 3.87–5.11)
RDW: 15 % (ref 11.5–15.5)
WBC: 13.9 10*3/uL — ABNORMAL HIGH (ref 4.0–10.5)

## 2016-11-20 LAB — PROCALCITONIN: Procalcitonin: <0.1 ng/mL

## 2016-11-20 MED ORDER — ATORVASTATIN CALCIUM 80 MG PO TABS
80.0000 mg | ORAL_TABLET | Freq: Every day | ORAL | Status: DC
  Administered 2016-11-21 – 2016-11-23 (×3): 80 mg via ORAL
  Filled 2016-11-20 (×4): qty 1

## 2016-11-20 MED ORDER — VITAL HIGH PROTEIN PO LIQD: 1000.0000 mL | ORAL | Status: DC

## 2016-11-20 MED ORDER — DEXTROSE 50 % IV SOLN
INTRAVENOUS | Status: AC
  Administered 2016-11-20: 25 mL
  Filled 2016-11-20: qty 50

## 2016-11-20 MED ORDER — VITAL AF 1.2 CAL PO LIQD
1000.0000 mL | ORAL | Status: DC
  Administered 2016-11-20 – 2016-11-21 (×2): 1000 mL
  Filled 2016-11-20 (×2): qty 1000

## 2016-11-20 MED ORDER — INSULIN GLARGINE 100 UNIT/ML ~~LOC~~ SOLN
10.0000 [IU] | Freq: Every day | SUBCUTANEOUS | Status: DC
  Administered 2016-11-21 – 2016-11-24 (×4): 10 [IU] via SUBCUTANEOUS
  Filled 2016-11-20 (×4): qty 0.1

## 2016-11-20 MED ORDER — FENTANYL CITRATE (PF) 100 MCG/2ML IJ SOLN
25.0000 ug | INTRAMUSCULAR | Status: DC | PRN
  Administered 2016-11-20 – 2016-11-23 (×15): 25 ug via INTRAVENOUS
  Filled 2016-11-20 (×15): qty 2

## 2016-11-20 NOTE — Progress Notes (Signed)
STROKE TEAM PROGRESS NOTE   HISTORY OF PRESENT ILLNESS (per record) [name] is an 46 y.o. female (as stated the majority of the history is obtained from chart as patient is not able to give a history at this time.)  "Patient was transferred from [facility] from 10/20 to 10/27 with acute pancreatitis and delirium. No clear cause of her pancreatitis was found. After discharge home, the family reports that [name] was not compliant with her diabetes regimen. She didn't have a means to test her blood sugars, and would sporadically use her insulin. She became progressively more short of breath and nauseous over the past 2 days. She was also getting dry heaves. She started getting more confused. She was brought to ER and found to have severe metabolic acidosis with hyperglycemia consistent with DKA."Well in the ICU patient was noted to have a right arm tremor.  EEG was ordered and did not show any correlate of seizure activity.  MRI was obtained and did show a left MCA infarct that is acute/subacute.  At this time it is both in the DWI/ADC/and T2.  Exam correlates with a left MCA stroke.  In addition patient is also withdrawing from Xanax.  Her father and stepmother were at bedside.  They reported that the patient was admitted any pain as mentioned above from the H&P, she started developing some tremors or abnormal movements of her right side while at [facility] prior to discharge.  She was able to speak and talk normally upon discharge.   Date last known well: Unable to determine -at least 5 days ago Time last known well: Unable to determine tPA Given: No: Out of the window for TPA Modified Rankin: Rankin Score=0 NIH stroke scale of 7   SUBJECTIVE (INTERVAL HISTORY) No family is at the bedside.  She was sleeping when I walked in her room. On waking up, she is aphasic but crying "ohhh, ohhh". Left gaze preference, not able to move right UE, but spontaneous on the right LE.     OBJECTIVE Temp:  [99.3 F (37.4 C)-101.7 F (38.7 C)] 100.6 F (38.1 C) (11/03 0700) Pulse Rate:  [78-101] 88 (11/03 0700) Cardiac Rhythm: Normal sinus rhythm (11/03 0400) Resp:  [14-25] 19 (11/03 0700) BP: (128-153)/(65-83) 147/74 (11/03 0700) SpO2:  [94 %-100 %] 100 % (11/03 0700) FiO2 (%):  [40 %] 40 % (11/02 1600)  CBC:  Recent Labs Lab 11/19/16 0235 11/20/16 0356  WBC 19.0* 13.9*  HGB 10.2* 9.6*  HCT 30.0* 29.4*  MCV 81.1 84.2  PLT 524* 347    Basic Metabolic Panel:  Recent Labs Lab 11/18/16 0459  11/19/16 0235 11/20/16 0356  NA 145  < > 144 141  K 4.7  < > 3.8 4.8  CL 119*  < > 118* 113*  CO2 14*  < > 19* 15*  GLUCOSE 153*  < > 121* 336*  BUN 31*  < > 25* 18  CREATININE 1.54*  < > 1.27* 1.10*  CALCIUM 7.6*  < > 8.0* 7.9*  MG 1.9  --  1.8  --   PHOS 1.9*  --  3.5  --   < > = values in this interval not displayed.  Lipid Panel:    Component Value Date/Time   CHOL 162 11/07/2016 0722   TRIG 152 (H) 11/07/2016 0722   HDL 39 (L) 11/07/2016 0722   CHOLHDL 4.2 11/07/2016 0722   VLDL 30 11/07/2016 0722   LDLCALC 93 11/07/2016 0722  HgbA1c:  Lab Results  Component Value Date   HGBA1C 14.3 (H) 11/07/2016   Urine Drug Screen:    Component Value Date/Time   LABOPIA NONE DETECTED 09/30/2012 1959   COCAINSCRNUR NONE DETECTED 09/30/2012 1959   LABBENZ POSITIVE (A) 09/30/2012 1959   AMPHETMU NONE DETECTED 09/30/2012 1959   THCU NONE DETECTED 09/30/2012 1959   LABBARB NONE DETECTED 09/30/2012 1959    Alcohol Level     Component Value Date/Time   ETH <10 11/17/2016 1103    IMAGING I have personally reviewed the radiological images below and agree with the radiology interpretations.  CT head Old right frontal infarct. New low-density scratched at low-density area in the left parietal lobe compatible with infarct, age indeterminate, new since 2014. Atrophy, chronic microvascular disease.  Mr [name] Head Wo Contrast 11/18/2016 IMPRESSION:  1.  Large areas of late acute/early subacute infarction within the left MCA distribution. No associated hemorrhage. Mild mass effect with partial effacement of left lateral ventricle and 3 mm left-to-right midline shift.  2. Left internal carotid artery occlusion with partial reconstitution of flow the level of the terminus. Asymmetric diminished flow related signal in left MCA distribution in comparison with right.  3. Multiple chronic infarctions as above.   Transthoracic Echocardiogram - pending  Bilateral Carotid Dopplers - pending   PHYSICAL EXAM Vitals:   11/20/16 0400 11/20/16 0500 11/20/16 0600 11/20/16 0700  BP: (!) 142/73 (!) 141/68 140/65 (!) 147/74  Pulse: 85 96 99 88  Resp: 19 20 (!) 22 19  Temp: 100 F (37.8 C) 100.2 F (37.9 C) (!) 100.6 F (38.1 C) (!) 100.6 F (38.1 C)  TempSrc:      SpO2: 100% 100% 99% 100%  Weight:      Height:        Temp:  [99.3 F (37.4 C)-101.7 F (38.7 C)] 99.3 F (37.4 C) (11/03 1000) Pulse Rate:  [78-99] 98 (11/03 1000) Resp:  [14-28] 25 (11/03 1000) BP: (127-153)/(58-83) 127/58 (11/03 1000) SpO2:  [94 %-100 %] 99 % (11/03 1000) FiO2 (%):  [40 %] 40 % (11/02 1600)  General - thin, well developed, crying "ohhh, ohhh' on waking up from sleep  Ophthalmologic - Fundi not visualized due to noncooperation.  Cardiovascular - Regular rate and rhythm.  Neuro - awake alert but global aphasia, not following commands, no speech output. Appears to have right side neglect. Blinking to visual threat on the left but not on the right. PERRL, left gaze preference but able to cross midline, incomplete on right gaze. Facial seems to have left facial weakness, tongue midline in mouth. RUE and RLE spontaneous movement with RLE BKA. LUE 0/5 and no movement on pain, LLE brisk movement on pain and with spontaneous movement. Sensation, coordination not cooperative and gait not tested.  ASSESSMENT/PLAN [name] is a 46 y.o. female with history of  previous strokes, diabetes mellitus, tobacco use, coronary artery disease with previous MI, peripheral arterial disease, ischemic cardiomyopathy, hyperlipidemia, medical noncompliance, anxiety, depression, and a history of pancreatitis presenting with altered mental status. She did not receive IV t-PA due to unknown time of onset.  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 Etiology likely due to known hx of thrombophilia.   Resultant global aphasia, right UE plegia  CT head -acute left MCA infarct, old right MCA, ACA infarcts  MRI head - Large areas of late acute/early subacute infarction within the left MCA distribution.   Partial  effacement of left lateral ventricle and 3 mm left-to-right midline shift.   MRA head - Lt ICA occlusion with partial reconstitution. Diminished flow in left MCA distribution.  Carotid Doppler - pending  2D Echo - pending  LDL - 93  HgbA1c - 14.3  VTE prophylaxis - Subcutaneous heparin  Diet NPO time specified  No antithrombotic prior to admission, now on aspirin suppository 300 mg daily.   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  06/2561 - left leg ischemic change, embolic events, s/p left LE thrombectomy -put on Coumadin  03/2011 - multifocal infarcts involving right MCA/ACA, right MCA, right cerebellum, embolic pattern, EEG negative for seizure, TEE and TTE negative - hypercoagulable workup showed protein C and protein S deficiency and positive lupus anticoagulant testing, negative for anticardiolipin and anti-beta-2 glycoprotein antibodies - continue the Coumadin and add Plavix   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, continue Coumadin, Plavix discontinued  Patient was on Eliquis PTA - likely noncompliant  Left ICA  occlusion, 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 and noncompliant with anticoagulation  MRA nonvisualization of left ICA  Carotid Doppler pending  Once creatinine normalized, may consider CTA of the neck to further evaluation  Uncontrolled diabetes  HgbA1c 14.3, goal < 7.0  Uncontrolled  11/06/16 - 11/13/16 admitted to [facility] for DKA  History of left foot infection with progression ended up with left BKA  SSI and CBG monitoring  On Lantus  Management per primary team  Hypertension  Stable  Permissive hypertension (OK if < 220/120) but gradually normalize in 5-7 days  Long-term BP goal normotensive  Hyperlipidemia  Home meds: No lipid lowering medications prior to admission  LDL 93, goal < 70  Lipitor 80 when taking PO meds.  Continue statin at discharge  AKI on CKD  Creatinine 1.79-> 1.47-> 1.27  Likely due to DKA and dehydration  Continue IV fluid, currently lactated Ringers @150  cc/h  UTI with leukocytosis, improving  WBC 23.6-> 19.0->13.9  Urine culture - > 100,000 lactobacillus  On fluconazole  Tobacco abuse  Current smoker  Smoking cessation counseling will be provided  Other Stroke Risk Factors  Family hx stroke (Mother)  CAD/MI 2014 s/p stent  PAD  History of drug use  Medical noncompliance  Other Active Problems  Fever - 100.6   Anemia of chronic disease - 9.6 / 29.4  ? Xanax withdrawal  Left BKA  Hospital day # 3  This patient is critically ill due to left MCA large stroke, DKA, left ICA occlusion, UTI with leukocytosis, AKI and thrombophilia and at significant risk of neurological worsening, death form recurrent stroke, hemorrhagic conversion, DKA, sepsis, kidney failure, respiratory failure and heart failure. This patient's care requires constant monitoring of vital signs, hemodynamics, respiratory and cardiac monitoring, review of multiple databases, neurological assessment, discussion with family, other specialists and medical  decision making of high complexity. I spent 45 minutes of neurocritical care time in the care of this patient.  Rosalin Hawking, MD PhD Stroke Neurology 11/20/2016 3:10 PM   To contact Stroke Continuity provider, please refer to [email or web address] After hours, contact General Neurology

## 2016-11-20 NOTE — Progress Notes (Signed)
PULMONARY / CRITICAL CARE MEDICINE   Name: [name] MRN: [id number] DOB: 1970/08/25    ADMISSION DATE:  11/17/2016  REFERRING MD:  Dr. Venora Maples, ER  CHIEF COMPLAINT:  Altered mental status  HISTORY OF PRESENT ILLNESS:   Hx from ER staff, chart, and family.  46 yo female at [facility] from 10/20 to 10/27 with acute pancreatitis and delirium.  No clear cause of her pancreatitis was found.  After discharge home, Lisa family reports that [name] was not compliant with her diabetes regimen.  She didn't have a means to test her blood sugars, and would sporadically use her insulin.  She became progressively more short of breath and nauseous over Lisa past 2 days.  She was also getting dry heaves.  She started getting more confused.  She was brought to ER and found to have severe metabolic acidosis with hyperglycemia consistent with DKA.  SUBJECTIVE:   Extubated x 24 hours.  Awake and alert, moaning with pain. Follows selective commands. Expressive aphasia  VITAL SIGNS: BP (!) 127/58   Pulse 98   Temp 99.3 F (37.4 C)   Resp (!) 25   Ht _0  (1.651 m)   Wt 88 lb 6.5 oz (40.1 kg)   LMP 01/12/2011   SpO2 99%   BMI 14.71 kg/m   INTAKE / OUTPUT: I/O last 3 completed shifts: In: 3664 [I.V.:5400; NG/GT:60; IV Piggyback:95] Out: 1640 [Urine:1240; Emesis/NG output:400]  PHYSICAL EXAMINATION: General - chronically ill appearing female, supine in bed, awake and alert on 2L [place], sat of 99% Eyes - PERRLA ENT - dry mucosa, no LAD Cardiac - S1, S2, RRR, Tachy to SR, No RMG Chest - no wheeze, rales,  No accessory muscle use Abd - soft, non tender, BS + Ext - 1+ edema RU extremity,  Lt BKA. Fine tremor of RUE.  Skin - warm, dry, and intact no rashes or lesions Neuro - opens eyes with stimulation, not following commands, + RUE weakness, moving R leg.   LABS:  BMET  Recent Labs Lab 11/18/16 1246 11/19/16 0235 11/20/16 0356  NA 144 144 141  K 5.2* 3.8 4.8  CL 120* 118* 113*  CO2 14*  19* 15*  BUN 31* 25* 18  CREATININE 1.47* 1.27* 1.10*  GLUCOSE 136* 121* 336*   Electrolytes  Recent Labs Lab 11/17/16 1706  11/18/16 0459 11/18/16 1246 11/19/16 0235 11/20/16 0356  CALCIUM 7.4*  < > 7.6* 7.9* 8.0* 7.9*  MG 2.2  --  1.9  --  1.8  --   PHOS 3.3  --  1.9*  --  3.5  --   < > = values in this interval not displayed. CBC  Recent Labs Lab 11/18/16 0459 11/19/16 0235 11/20/16 0356  WBC 23.6* 19.0* 13.9*  HGB 9.9* 10.2* 9.6*  HCT 27.7* 30.0* 29.4*  PLT 517* 524* 365   Coag's No results for input(s): APTT, INR in Lisa last 168 hours.  Sepsis Markers  Recent Labs Lab 11/17/16 1316 11/17/16 1737 11/18/16 1246 11/19/16 0235 11/20/16 0356  LATICACIDVEN 2.93* 2.40*  --   --   --   PROCALCITON  --   --  0.60 0.32 <0.10   ABG  Recent Labs Lab 11/17/16 1100 11/17/16 1530 11/19/16 0440  PHART 6.965* 7.122* 7.385  PCO2ART BELOW REPORTABLE RANGE BELOW REPORTABLE RANGE 33.7  PO2ART 156* 113* 123*   Liver Enzymes  Recent Labs Lab 11/17/16 1103  AST 45*  ALT 12*  ALKPHOS 110  BILITOT 1.8*  ALBUMIN 2.0*   Cardiac Enzymes No results for input(s): TROPONINI, PROBNP in Lisa last 168 hours.  Glucose  Recent Labs Lab 11/19/16 1148 11/19/16 1459 11/19/16 2012 11/19/16 2359 11/20/16 0346 11/20/16 0839  GLUCAP 116* 122* 115* 194* 310* 212*   Imaging No results found. STUDIES:  CT head 10/31 >> Old Rt frontal infarct, Age indeterminate infarct Lt parietal lobe, chronic microvascular disease, cerebral atrophy MRI Brain11/1>> Large areas of late acute/early subacute infarction within Lisa left MCA distribution. No associated hemorrhage. Mild mass effect with partial effacement of left lateral ventricle and 3 mm left-to-right midline shift. Left internal carotid artery occlusion with partial reconstitution of flow Lisa level of Lisa terminus. Asymmetric diminished flow related signal in left MCA distribution in comparison with right. EEG>> EEG is  abnormal due to Lisa presence of moderate diffuse background slowing. Findings indicate diffuse cerebral dysfunction that is non-specific in etiology and can be seen with hypoxic/ischemic injury, toxic/metabolic encephalopathies, or medication effect.  Lisa absence of epileptiform discharges does not rule out a clinical diagnosis of epilepsy.    SIGNIFICANT EVENTS: 10/31 Admit 11/1 BG improving, notable RUE weakness and tremor, plan for MRI brain looking for CVA vs structural causes for tremor 11/2 >> Extubated  DISCUSSION: 46 yo female with DKA with coma likely related to medical non compliance.   ASSESSMENT / PLAN:  DKA, resolved. Poor compliance with insulin regimen. ON with hypoglycemia, given D50.  - Lantus 10 units daily - CBG Q 4 - sensitive  SSI  - Case Management Consult to ensure [name] has supplies to manage DM once discharged  Hypokalemia in setting of DKA>> resolved. Mg and Phos appropriate. S/p 9 runs IV K. K 4.7 11/1.  - monitor heart rhythm - f/u BMET 11/4 - replete lytes as needed  Acute renal failure with ATN >> baseline creatinine 0.72 from 11/08/16. Continues to improve.  - continue volume resuscitation - Trend BMET - Avoid nephrotoxic drugs - Monitor UO - replete electrolytes as needed  Large MCA infarct: given RUE weakness and AMS, Expressive aphasia, suspected new CVA based on CT head from ED. MRI confirmed large MCA infarct.  -consult stroke team - PT/OT/SLP>> Swallow study pending 11/3>> will need nutrition - hold outpt xanax, percocet, neurontin - monitor for opioid withdrawal>> prn ativan - EEG results as above  Hx of CAD s/p stenting. Hx of thromboembolism, HLD, PAD. - Telemetry monitoring - Maintain MAP > 65 - hold outpt eliquis for now>> resume per stroke team - Echo - Start Statin  Recent episode of pancreatitis. Severe protein calorie malnutrition. - lipase only 78 now >> was 1,797 during last admission at [facility] - prn zofran for nausea -  Nutrition post swallow eval  Anemia of critical illness and chronic disease. - f/u CBC - Monitor for obvious source of bleeding  DVT prophylaxis - SQ heparin SUP - not indicated Diet - NPO>> swallow eval in process Goals of care - full code - Palliative care met with family, full code status.  Magdalen Spatz, AGACNP-BC [facility] Pulmonary/Critical Care Medicine Pager # [phone] 11/20/2016 11:24 am  Attending Note:  46 year old female with IDDM since age 31 who has been very non-compliant with medications now presenting with L MCA CVA.  [name] was extubated on 11/2 but airway protection is questionable at best.  On exam, transmitted upper airway sounds are audible.  Failed swallow evaluation.  Place cortrak, restart TF, replace electrolytes.  Full code status per family.  [name] is critically  ill with multiple organ systems failure and requires high complexity decision making for assessment and support, frequent evaluation and titration of therapies, application of advanced monitoring technologies and extensive interpretation of multiple databases.   Critical Care Time devoted to [name] care services described in this note is  35  Minutes. This time reflects time of care of this signee Dr Jennet Maduro. This critical care time does not reflect procedure time, or teaching time or supervisory time of PA/NP/Med student/Med Resident etc but could involve care discussion time.  Rush Farmer, M.D. [facility] Pulmonary/Critical Care Medicine. Pager: [phone]. After hours pager: [phone].

## 2016-11-20 NOTE — Consult Note (Signed)
Consultation Note Date: 11/20/2016   Patient Name: [name]  DOB: 10-28-1970  MRN: [id number]  Age / Sex: 46 y.o., female  PCP: Sinda Du, MD Referring Physician: Rush Farmer, MD  Reason for Consultation: Establishing goals of care and Psychosocial/spiritual support  HPI/Patient Profile: 46 y.o. 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  Patient was recently treated at [facility] from 11/06/2016 to 11/13/2016 with acute pancreatitis and associated delirium.  No clear cause of her pancreatitis was found.  After discharge home family reports that patient was not compliant with her diabetes  regimen (did not have a means to test her blood sugar, would only sporadically use her insulin).  She became progressively more short of breath,  nauseated and presented to the emergency room with metabolic acidosis, hyperglycemia consistent with DKA.Marland Kitchen   Clinical Assessment and Goals of Care: Met with patient, reviewed chart.  Patient was extubated on 11/19/2016 and her respiratory status has been stable since that time.  At this point she is unable to speak for herself.  She has expressive perhaps global aphasia.  She is agitated, anxious.  She is crying and moaning.  When I asked her if she lived in [place] she did make eye contact and shake her head yes but otherwise has been very sporadic in her ability to answer even simple questions yes/no  Unfortunately at this point patient can no longer speak for  herself regarding advanced directives.  She is not married.  She has 1 daughter, Rockey Situ, [phone], who would be her healthcare proxy according to [facility].  Her father, Birdell Frasier is also alive.  Family appears to be making decisions jointly.  Family meeting held with daughter, father, stepmother, aunt., and niece.  They share that patient has a long history of noncompliance with her diabetes.  They state she does not even own a machine at home to test her blood sugar.  She lives with her daughter Vikki Ports.  Vikki Ports states her mother has shared recently how tired she is of being sick.  Chelsea states her mother often times stays in the bed until 4:00 PM, and that she is depressed.  Despite her difficult medical history, recent hospitalizations and now new stroke, family states they feel that she would want to continue to try to do everything she can to get better.  Introduced and defined terms of full code, DO NOT RESUSCITATE.  We also discussed as a consequence of her stroke, the possibility that she would not be able to eat and thus questions regarding artificial nutrition, specifically PEG tube will need to be addressed.  We also discussed long-range planning of likely need to  be living in a skilled nursing facility after discharge    SUMMARY OF RECOMMENDATIONS   Full scope of treatment  Code Status/Advance Care Planning:  Full code    Symptom Management:   Pain: Family shares that patient has chronic pain, left phantom leg limb pain and takes 6 Percocets daily prior to this admission.  She is moaning and indicating that she is in pain now.  Will defer to critical care medicine, fentanyl 25-50 mcg every 4 hours as needed may be an option  Anxiety: Patient is benzodiazepine dependent.  Family states she does not take Xanax 1 mg 3 times daily and that she uses her Xanax approximately 3-4 days a week versus daily.  Continue with Ativan as needed  Palliative  Prophylaxis:   Aspiration, Bowel Regimen, Delirium Protocol, Eye Care, Frequent Pain Assessment, Oral Care and Turn Reposition  Additional Recommendations (Limitations, Scope, Preferences):  Full Scope Treatment  Psycho-social/Spiritual:   Desire for further Chaplaincy support:no  Additional Recommendations: Referral to Community Resources   Prognosis:   Unable to determine  Discharge Planning: To Be Determined      Primary Diagnoses: Present on Admission: . DKA, type 1 ([place])   I have reviewed the medical record, interviewed the patient and family, and examined the patient. The following aspects are pertinent.  Past Medical History:  Diagnosis Date  . Anxiety   . 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 Coronary atherosclerosis of native coronary artery    DES to LAD September 2014  . Depression   . Facial cellulitis    12/2010  . Glomerulonephritis   . Headache(784.0)   . History of stroke   . Hyperlipidemia   . Insulin dependent diabetes mellitus ([place])    History of diabetic ketoacidosis  . Ischemic cardiomyopathy    LVEF 40-45%  . Peripheral arterial disease ([place])   . Shingles    11/2010  . ST elevation myocardial infarction (STEMI) of anterior wall [facility])    Late presentation September 2014   Social History   Social History  . Marital status: Single    Spouse name: N/A  . Number of children: N/A  . Years of education: N/A   Social History Main Topics  . Smoking status: Current Every Day Smoker    Packs/day: 0.50    Years: 21.00    Types: Cigarettes    Start date: 07/06/1989    Last attempt to quit: 01/30/2011  . Smokeless tobacco: Never Used  . Alcohol use No     Comment: rarely  . Drug use: Yes    Frequency: 1.0 time per week    Types: Marijuana     Comment: Former  . Sexual activity: Yes    Partners: Male     Comment: pt stated that her tubal was only 50% effective- "not cut and burned"   Other  Topics Concern  . None   Social History Narrative   Unemployed.  Lives in [place] with [place], [place], and mother.  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   Family History  Problem Relation Age of Onset  . COPD Mother        alive - 88  . Hypertension Mother   . Diabetes Mother   . Stroke Mother   . Coronary artery disease Mother   . Diabetes Father        alive - 5  . Hypertension Father   . Coronary artery disease Father   .  Anesthesia problems Neg Hx    Scheduled Meds: . aspirin EC  325 mg Oral Daily   Or  . aspirin  300 mg Rectal Daily  . atorvastatin  80 mg Oral q1800  . chlorhexidine gluconate (MEDLINE KIT)  15 mL Mouth Rinse BID  . heparin  5,000 Units Subcutaneous Q8H  . insulin aspart  0-9 Units Subcutaneous Q4H  . [START ON 11/21/2016] insulin glargine  10 Units Subcutaneous Daily  . mouth rinse  15 mL Mouth Rinse BID  . mouth rinse  15 mL Mouth Rinse QID   Continuous Infusions: . fluconazole (DIFLUCAN) IV Stopped (11/20/16 1302)  . lactated ringers 150 mL/hr (11/20/16 0405)   PRN Meds:.acetaminophen, LORazepam, ondansetron (ZOFRAN) IV Medications Prior to Admission:  Prior to Admission medications   Medication Sig Start Date End Date Taking? Authorizing Provider  ALPRAZolam Duanne Moron) 1 MG tablet Take 1 mg by mouth 3 (three) times daily.    Yes [provider]  apixaban (ELIQUIS) 5 MG TABS tablet Take 5 mg by mouth 2 (two) times daily.   Yes [provider]  gabapentin (NEURONTIN) 300 MG capsule Take 300 mg by mouth at bedtime.   Yes [provider]  insulin aspart (NOVOLOG FLEXPEN) 100 UNIT/ML injection Inject 2-10 Units into the skin 3 (three) times daily before meals. Sliding scale as follows 200-250=2 units 251-300=4 units 301-350=6 units 351-400=8 units >400-give 10 units and Notify MD   Yes [provider]  Insulin Glargine (LANTUS SOLOSTAR) 100 UNIT/ML SOPN Inject 15 Units into the skin at bedtime. Patient  taking differently: Inject 25 Units into the skin at bedtime.  10/04/12  Yes Donne Hazel, MD  nitroGLYCERIN (NITROSTAT) 0.4 MG SL tablet Place 1 tablet (0.4 mg total) under the tongue every 5 (five) minutes as needed for chest pain. 10/30/12  Yes Lendon Colonel, NP  oxyCODONE-acetaminophen (PERCOCET) 10-325 MG tablet Take 1 tablet by mouth 4 (four) times daily. 11/13/16  Yes Sinda Du, MD  promethazine (PHENERGAN) 12.5 MG tablet Take 12.5 mg by mouth every 4 (four) hours as needed for nausea or vomiting.    Yes [provider]   Allergies  Allergen Reactions  . Ibuprofen Other (See Comments)    Kidney disease  . Losartan Swelling   Review of Systems  Unable to perform ROS: Acuity of condition    Physical Exam  Constitutional:  Acutely ill appearing middle-aged female seen in ICU.  She is crying, in distress  HENT:  Head: Normocephalic and atraumatic.  Cardiovascular: Normal rate.   Pulmonary/Chest: Effort normal.  Was extubated 11/19/2016  Musculoskeletal:  Left above-the-knee amputation (2013)  Neurological: She is alert.  She is alert, able to track individuals in the room but has expressive versus global aphasia Right upper extremity is flaccid; right lower extremity is contracted  Skin: Skin is warm and dry.  Psychiatric:  Anxious, crying  Nursing note and vitals reviewed.   Vital Signs: BP (!) 131/58   Pulse 87   Temp 100.2 F (37.9 C)   Resp (!) 22   Ht _0  (1.651 m)   Wt 40.1 kg (88 lb 6.5 oz)   LMP 01/12/2011   SpO2 100%   BMI 14.71 kg/m  Pain Assessment: FLACC   Pain Score: Asleep   SpO2: SpO2: 100 % O2 Device:SpO2: 100 % O2 Flow Rate: .O2 Flow Rate (L/min): 2 L/min  IO: Intake/output summary:  Intake/Output Summary (Last 24 hours) at 11/20/16 1405 Last data filed at 11/20/16 1302  Gross per 24 hour  Intake             3505 ml  Output             1300 ml  Net             2205 ml    LBM: Last BM Date: 11/11/16 Baseline  Weight: Weight: 45.4 kg (100 lb) Most recent weight: Weight: 40.1 kg (88 lb 6.5 oz)     Palliative Assessment/Data:   Flowsheet Rows     Most Recent Value  Intake Tab  Referral Department  Critical care  Unit at Time of Referral  ICU  Palliative Care Primary Diagnosis  Neurology  Date Notified  11/19/16  Palliative Care Type  New Palliative care  Reason for referral  Clarify Goals of Care, Psychosocial or Spiritual support  Date of Admission  11/17/16  Date first seen by Palliative Care  11/20/16  # of days Palliative referral response time  1 Day(s)  # of days IP prior to Palliative referral  2  Clinical Assessment  Palliative Performance Scale Score  30%  Pain Max last 24 hours  Not able to report  Pain Min Last 24 hours  Not able to report  Dyspnea Max Last 24 Hours  Not able to report  Dyspnea Min Last 24 hours  Not able to report  Nausea Max Last 24 Hours  Not able to report  Nausea Min Last 24 Hours  Not able to report  Anxiety Max Last 24 Hours  Not able to report  Anxiety Min Last 24 Hours  Not able to report  Other Max Last 24 Hours  Not able to report  Psychosocial & Spiritual Assessment  Palliative Care Outcomes  Patient/Family meeting held?  Yes  Who was at the meeting?  pt's dtr, father, step mother, aunt and niece  Palliative Care Outcomes  Clarified goals of care  Palliative Care follow-up planned  Yes, Facility      Time In: 1200 Time Out: 1330 Time Total: 90 min Greater than 50%  of this time was spent counseling and coordinating care related to the above assessment and plan. Staffed with Dr. Nelda Marseille  Signed by: Dory Horn, NP   Please contact Palliative Medicine Team phone at [phone] for questions and concerns.  For individual provider: See Shea Evans

## 2016-11-20 NOTE — Evaluation (Signed)
Clinical/Bedside Swallow Evaluation Patient Details  Name: [name] MRN: [id number] Date of Birth: 10-04-1970  Today's Date: 11/20/2016 Time: SLP Start Time (ACUTE ONLY): 0803 SLP Stop Time (ACUTE ONLY): 0839 SLP Time Calculation (min) (ACUTE ONLY): 36 min  Past Medical History:  Past Medical History:  Diagnosis Date  . Anxiety   . Arterial thromboembolism ([place])    Prior embolectomy, previously on Coumadin  . Coronary atherosclerosis of native coronary artery    DES to LAD September 2014  . Depression   . Facial cellulitis    12/2010  . Glomerulonephritis   . Headache(784.0)   . History of stroke   . Hyperlipidemia   . Insulin dependent diabetes mellitus ([place])    History of diabetic ketoacidosis  . Ischemic cardiomyopathy    LVEF 40-45%  . Peripheral arterial disease ([place])   . Shingles    11/2010  . ST elevation myocardial infarction (STEMI) of anterior wall [facility])    Late presentation September 2014   Past Surgical History:  Past Surgical History:  Procedure Laterality Date  . AMPUTATION  03/03/2011   Procedure: AMPUTATION DIGIT;  Surgeon: Newt Minion, MD;  Location: [place];  Service: Orthopedics;  Laterality: Left;  Left Foot Amputation 4th and 5th toes at MTP joint  . AMPUTATION  04/22/2011   Procedure: AMPUTATION BELOW KNEE;  Surgeon: Newt Minion, MD;  Location: [place];  Service: Orthopedics;  Laterality: Left;  Left Below Knee Amputation  . DILATION AND CURETTAGE OF UTERUS    . EMBOLECTOMY  01/29/2011   Procedure: EMBOLECTOMY;  Surgeon: Mal Misty, MD;  [facility] OR;  Service: Vascular;  Laterality: Left;  Left Popliteal and Tibial Embolectomy with patch angioplasty  . INCISION AND DRAINAGE ABSCESS N/A 04/06/2016   Procedure: INCISION AND DRAINAGE ABSCESS;  Surgeon: Jonnie Kind, MD;  Location: [facility] ORS;  Service: Gynecology;  Laterality: N/A;  . LEFT HEART CATHETERIZATION WITH CORONARY ANGIOGRAM N/A 10/01/2012   Procedure: LEFT HEART CATHETERIZATION WITH  CORONARY ANGIOGRAM;  Surgeon: Peter M Martinique, MD;  Location: [facility] CATH LAB;  Service: Cardiovascular;  Laterality: N/A;  . LOWER EXTREMITY ANGIOGRAM N/A 01/27/2011   Procedure: LOWER EXTREMITY ANGIOGRAM;  Surgeon: Rosetta Posner, MD;  Location: [facility] CATH LAB;  Service: Cardiovascular;  Laterality: N/A;  . LOWER EXTREMITY ANGIOGRAM N/A 01/28/2011   Procedure: LOWER EXTREMITY ANGIOGRAM;  Surgeon: Conrad [place] Hills, MD;  Location: [facility] CATH LAB;  Service: Cardiovascular;  Laterality: N/A;  . LOWER EXTREMITY ANGIOGRAM Left 01/29/2011   Procedure: LOWER EXTREMITY ANGIOGRAM;  Surgeon: Mal Misty, MD;  Location: [facility] CATH LAB;  Service: Cardiovascular;  Laterality: Left;  Marland Kitchen MULTIPLE TOOTH EXTRACTIONS    . TEE WITHOUT CARDIOVERSION  04/15/2011   Procedure: TRANSESOPHAGEAL ECHOCARDIOGRAM (TEE);  Surgeon: [name], MD;  [facility];  Service: Cardiovascular;  Laterality: N/A;  . WRIST SURGERY     Left   HPI:  Pt is a 46 yo female at [facility] from 10/20 to 10/27 with acute pancreatitis and delirium. No clear cause of her pancreatitis was found. After discharge home, the family reports that [name] was not compliant with her diabetes regimen. She didn't have a means to test her blood sugars, and would sporadically use her insulin. She became progressively more short of breath and nauseous over the past 2 days. She was also getting dry heaves. She started getting more confused. She was brought to ER and found to have severe metabolic acidosis with hyperglycemia consistent  with DKA. MRI of head reveals Large areas of late acute/early subacute infarction within the left MCA distribution as well as multiple chronic infarcts. Intubated 11/1 to 11/2. Chest x ray without acute cardiopulmonary disease.    Assessment / Plan / Recommendation Clinical Impression  Pt presents with a suspected moderate oropharyngeal dysphagia of neurogenic etiology (MRI significant for acute to subacute large left MCA territory  infarct). Swallow deficits likely negatively impacted by 1 day intubation, with extubation date noted 11/2.   Residual right sided oral motor deficits observed including mild right facial droop, decreased facial sensation, and decreased strength and ROM. These resulted in intermittent right sided anterior spillage by cup sip without pt awareness. Pt alert however emotionally labile throughout interaction. Suspect global deficits in receptive and expressive language as pt unable to consistently follow simple verbal commands and SLP unable to stimulate verbal responses from the pt. Further formal cognitive linguistic evaluation warranted. Pt exhibited immediate and delayed cough with thin and nectar thick liquids with suspected decreased airway protection. Changes in vocal quality also exhibited. Recommend proceed with objective swallow testing, MBS this date to determine safest diet. Continue NPO until completion of MBS.  SLP Visit Diagnosis: Dysphagia, unspecified (R13.10)    Aspiration Risk  Moderate aspiration risk;Risk for inadequate nutrition/hydration    Diet Recommendation   NPO, pending completion of MBS  Medication Administration: Via alternative means    Other  Recommendations Oral Care Recommendations: Oral care QID   Follow up Recommendations        Frequency and Duration min 2x/week  2 weeks       Prognosis Prognosis for Safe Diet Advancement: Good Barriers to Reach Goals: Language deficits;Time post onset;Severity of deficits      Swallow Study   General Date of Onset: 11/17/16 HPI: Pt is a 46 yo female at [facility] from 10/20 to 10/27 with acute pancreatitis and delirium. No clear cause of her pancreatitis was found. After discharge home, the family reports that [name] was not compliant with her diabetes regimen. She didn't have a means to test her blood sugars, and would sporadically use her insulin. She became progressively more short of breath and nauseous over the past  2 days. She was also getting dry heaves. She started getting more confused. She was brought to ER and found to have severe metabolic acidosis with hyperglycemia consistent with DKA. MRI of head reveals Large areas of late acute/early subacute infarction within the left MCA distribution as well as multiple chronic infarcts. Intubated 11/1 to 11/2. Chest x ray without acute cardiopulmonary disease.  Type of Study: Bedside Swallow Evaluation Previous Swallow Assessment: none on file Diet Prior to this Study: NPO Temperature Spikes Noted: Yes Respiratory Status: Room air History of Recent Intubation: Yes Length of Intubations (days): 1 days Date extubated: 11/19/16 Behavior/Cognition: Alert;Requires cueing;Other (Comment) (appears aphasic, emotionally labile ) Oral Cavity Assessment: Dry Oral Care Completed by SLP: Yes Oral Cavity - Dentition: Edentulous Self-Feeding Abilities: Needs assist Patient Positioning: Upright in bed Baseline Vocal Quality: Low vocal intensity Volitional Cough: Cognitively unable to elicit Volitional Swallow: Unable to elicit    Oral/Motor/Sensory Function Overall Oral Motor/Sensory Function: Moderate impairment Facial ROM: Reduced right;Suspected CN VII (facial) dysfunction Facial Symmetry: Abnormal symmetry right;Suspected CN VII (facial) dysfunction Facial Sensation: Reduced right;Suspected CN V (Trigeminal) dysfunction   Ice Chips Ice chips: Impaired Presentation: Spoon Oral Phase Impairments: Reduced labial seal Oral Phase Functional Implications: Prolonged oral transit Pharyngeal Phase Impairments: Suspected delayed Swallow;Multiple swallows;Decreased hyoid-laryngeal movement  Thin Liquid Thin Liquid: Impaired Presentation: Spoon Oral Phase Functional Implications: Prolonged oral transit Pharyngeal  Phase Impairments: Suspected delayed Swallow;Decreased hyoid-laryngeal movement;Cough - Immediate;Cough - Delayed;Wet Vocal Quality;Multiple swallows     Nectar Thick Nectar Thick Liquid: Impaired Presentation: Cup;Spoon Oral Phase Impairments: Reduced labial seal Oral phase functional implications: Prolonged oral transit;Right anterior spillage Pharyngeal Phase Impairments: Multiple swallows;Cough - Delayed;Throat Clearing - Delayed;Wet Vocal Quality;Suspected delayed Swallow;Decreased hyoid-laryngeal movement   Honey Thick Honey Thick Liquid: Not tested   Puree Puree: Impaired Presentation: Spoon Oral Phase Impairments: Reduced lingual movement/coordination Oral Phase Functional Implications: Prolonged oral transit Pharyngeal Phase Impairments: Suspected delayed Swallow;Decreased hyoid-laryngeal movement;Multiple swallows   Solid   GO   Solid: Impaired Presentation: Self Fed Oral Phase Impairments: Other (comment) (attempted to eat, then disinterest showed did not place )       Arvil Chaco MA, [place] 11/20/2016,8:44 AM

## 2016-11-20 NOTE — Progress Notes (Addendum)
Cortrak Tube Team Note:  Consult received to place a Cortrak feeding tube.   A 10 F Cortrak tube was placed in the R nare and secured with a nasal bridle at 70 cm. Per the Cortrak monitor reading the tube tip is located in gastric antrum, approximately at the pylorus. Pt was highly resistant to placement, had to be restrained w/ help of nurse x2. Did not seek postpyloric placement given highly agitated disposition.   No x-ray is required. RN may begin using tube.   If the tube becomes dislodged please keep the tube and contact the Cortrak team at [email or web address] (password [email or web address]) for replacement.  If after hours and replacement cannot be delayed, place a NG tube and confirm placement with an abdominal x-ray.   Addendum. MD desires TF begins. Will use TF recommendations left by RD 11/1:  Vital AF 1.2 at 50 ml/h (1200 ml per day) Provides 1440 kcal, 90 gm protein, 973 ml free water daily  Burtis Junes RD, LDN, CNSC Clinical Nutrition Pager: [phone] 11/20/2016 5:41 PM

## 2016-11-20 NOTE — Progress Notes (Signed)
Modified Barium Swallow Progress Note  Patient Details  Name: [name] MRN: [id number] Date of Birth: [birth year]  Today's Date: 11/20/2016  Modified Barium Swallow completed.  Full report located under Chart Review in the Imaging Section.  Brief recommendations include the following:  Clinical Impression  Patient presents with suspected moderate oropharyngeal dysphagia with sensory and motor impairments secondary to neurologic deficits and recent, brief intubation. This objective study was very limited as pt began gagging when 3rd bolus was administered. Pt grew very anxious, moaning in pain and was unable to continue the study. Despite limited observation, noted oral weakness resulting in decreased bolus cohesion, piecemeal swallowing. Tongue base retraction, hyolaryngeal excursion and pharyngeal constriction reduced, resulting in mild-moderate pharyngeal residue. Swallow initiation delayed with puree, with reversible penetration during the swallow. Pt coughing between frames; no observed aspiration but ? reduced airway protection with secretions, possibly diluted residue. As teaspoon bolus of honey was administered, pt began gagging, with backflow of vallecular puree residue into the oral cavity, which pt expectorated along with honey-thick bolus. Pt began crying, moaning, HR elevated. SLP provided encouragement, relaxation techniques. Pt calmed slightly but she continued moaning and was unable to continue the exam. Given her clinical signs of aspiration at bedside and observed impairments in this brief exam, she is at moderate-severe risk for aspiration. Recommend she remain NPO until she is able to participate in repeat instrumental exam; will follow to determine readiness for repeat MBS or possible FEES Monday. Overall prognosis good, though she will likely need temporary means of alternative nutrition.   Swallow Evaluation Recommendations       SLP Diet Recommendations: Alternative means  - temporary;NPO       Medication Administration: Via alternative means               Oral Care Recommendations: Oral care QID      Deneise Lever, [place], [place] Speech-Language Pathologist [phone]  Aliene Altes 11/20/2016,1:34 PM

## 2016-11-20 NOTE — Progress Notes (Addendum)
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

## 2016-11-20 NOTE — Progress Notes (Signed)
Hypoglycemic Event  CBG: 69 @ 1929  Treatment: D50 IV 25 mL @ 2006  Symptoms: None  Follow-up CBG: Time: 2032 CBG Result: 148  Possible Reasons for Event: Inadequate meal intake  Comments/MD notified: TF initiated at [place] via core track at 54mL/hr, MD not notified.    [name]

## 2016-11-21 ENCOUNTER — Inpatient Hospital Stay (HOSPITAL_COMMUNITY): Payer: Medicaid Other

## 2016-11-21 DIAGNOSIS — I340 Nonrheumatic mitral (valve) insufficiency: Secondary | ICD-10-CM

## 2016-11-21 DIAGNOSIS — L8990 Pressure ulcer of unspecified site, unspecified stage: Secondary | ICD-10-CM

## 2016-11-21 DIAGNOSIS — N179 Acute kidney failure, unspecified: Secondary | ICD-10-CM

## 2016-11-21 DIAGNOSIS — N3000 Acute cystitis without hematuria: Secondary | ICD-10-CM

## 2016-11-21 DIAGNOSIS — D6859 Other primary thrombophilia: Secondary | ICD-10-CM

## 2016-11-21 DIAGNOSIS — I63132 Cerebral infarction due to embolism of left carotid artery: Secondary | ICD-10-CM

## 2016-11-21 DIAGNOSIS — I6522 Occlusion and stenosis of left carotid artery: Secondary | ICD-10-CM

## 2016-11-21 LAB — ECHOCARDIOGRAM COMPLETE
Ao-asc: 28 cm
E decel time: 151 msec
E/e' ratio: 19.96
FS: 23 % — AB (ref 28–44)
Height: 65 in
IVS/LV PW RATIO, ED: 0.97
LA ID, A-P, ES: 31 mm
LA diam end sys: 31 mm
LA diam index: 2.12 cm/m2
LA vol A4C: 28.8 ml
LA vol index: 19.2 mL/m2
LA vol: 28.1 mL
LV E/e' medial: 19.96
LV E/e'average: 19.96
LV PW d: 12.1 mm — AB (ref 0.6–1.1)
LV dias vol index: 34 mL/m2
LV dias vol: 50 mL (ref 46–106)
LV e' LATERAL: 5.11 cm/s
LV sys vol index: 32 mL/m2
LV sys vol: 46 mL — AB (ref 14–42)
LVOT SV: 30 mL
LVOT VTI: 16.7 cm
LVOT area: 1.77 cm2
LVOT diameter: 15 mm
LVOT peak grad rest: 4 mmHg
LVOT peak vel: 105 cm/s
Lateral S' vel: 12.3 cm/s
MV Dec: 151
MV Peak grad: 4 mmHg
MV pk A vel: 113 m/s
MV pk E vel: 102 m/s
Simpson's disk: 8
Stroke v: 4 ml
TAPSE: 16.8 mm
TDI e' lateral: 5.11
TDI e' medial: 4.46
Weight: 1573.2 oz

## 2016-11-21 LAB — GLUCOSE, CAPILLARY
Glucose-Capillary: 146 mg/dL — ABNORMAL HIGH (ref 65–99)
Glucose-Capillary: 179 mg/dL — ABNORMAL HIGH (ref 65–99)
Glucose-Capillary: 212 mg/dL — ABNORMAL HIGH (ref 65–99)
Glucose-Capillary: 230 mg/dL — ABNORMAL HIGH (ref 65–99)
Glucose-Capillary: 244 mg/dL — ABNORMAL HIGH (ref 65–99)
Glucose-Capillary: 268 mg/dL — ABNORMAL HIGH (ref 65–99)

## 2016-11-21 LAB — CBC
HCT: 28.2 % — ABNORMAL LOW (ref 36.0–46.0)
Hemoglobin: 9.3 g/dL — ABNORMAL LOW (ref 12.0–15.0)
MCH: 27.8 pg (ref 26.0–34.0)
MCHC: 33 g/dL (ref 30.0–36.0)
MCV: 84.2 fL (ref 78.0–100.0)
Platelets: 359 10*3/uL (ref 150–400)
RBC: 3.35 MIL/uL — ABNORMAL LOW (ref 3.87–5.11)
RDW: 14.9 % (ref 11.5–15.5)
WBC: 11.5 10*3/uL — ABNORMAL HIGH (ref 4.0–10.5)

## 2016-11-21 LAB — BASIC METABOLIC PANEL
Anion gap: 13 (ref 5–15)
BUN: 20 mg/dL (ref 6–20)
CO2: 16 mmol/L — ABNORMAL LOW (ref 22–32)
Calcium: 8 mg/dL — ABNORMAL LOW (ref 8.9–10.3)
Chloride: 113 mmol/L — ABNORMAL HIGH (ref 101–111)
Creatinine, Ser: 0.94 mg/dL (ref 0.44–1.00)
GFR calc Af Amer: >60 mL/min (ref >60)
GFR calc non Af Amer: >60 mL/min (ref >60)
Glucose, Bld: 299 mg/dL — ABNORMAL HIGH (ref 65–99)
Potassium: 3.6 mmol/L (ref 3.5–5.1)
Sodium: 142 mmol/L (ref 135–145)

## 2016-11-21 LAB — HEMOGLOBIN A1C
Hgb A1c MFr Bld: 13.6 % — ABNORMAL HIGH (ref 4.8–5.6)
Mean Plasma Glucose: 343.62 mg/dL

## 2016-11-21 LAB — MAGNESIUM
Magnesium: 1.6 mg/dL — ABNORMAL LOW (ref 1.7–2.4)
Magnesium: 2 mg/dL (ref 1.7–2.4)

## 2016-11-21 LAB — PHOSPHORUS: Phosphorus: 2.4 mg/dL — ABNORMAL LOW (ref 2.5–4.6)

## 2016-11-21 MED ORDER — IOPAMIDOL (ISOVUE-370) INJECTION 76%
INTRAVENOUS | Status: AC
  Administered 2016-11-21: 50 mL
  Filled 2016-11-21: qty 50

## 2016-11-21 MED ORDER — POTASSIUM CHLORIDE 20 MEQ/15ML (10%) PO SOLN
40.0000 meq | Freq: Once | ORAL | Status: AC
Start: 2016-11-21 — End: 2016-11-21
  Administered 2016-11-21: 40 meq via ORAL
  Filled 2016-11-21: qty 30

## 2016-11-21 MED ORDER — FUROSEMIDE 10 MG/ML IJ SOLN
20.0000 mg | Freq: Once | INTRAMUSCULAR | Status: AC
  Administered 2016-11-21: 20 mg via INTRAVENOUS
  Filled 2016-11-21: qty 2

## 2016-11-21 MED ORDER — ORAL CARE MOUTH RINSE
15.0000 mL | Freq: Two times a day (BID) | OROMUCOSAL | Status: DC
  Administered 2016-11-21 – 2016-11-23 (×6): 15 mL via OROMUCOSAL

## 2016-11-21 MED ORDER — MAGNESIUM SULFATE 2 GM/50ML IV SOLN
2.0000 g | Freq: Once | INTRAVENOUS | Status: AC
  Administered 2016-11-21: 2 g via INTRAVENOUS
  Filled 2016-11-21: qty 50

## 2016-11-21 NOTE — Progress Notes (Signed)
PULMONARY / CRITICAL CARE MEDICINE   Name: Lisa [name] MRN: [id number] DOB: 1971-01-15    ADMISSION DATE:  11/17/2016  REFERRING MD:  Dr. Venora Maples, ER  CHIEF COMPLAINT:  Altered mental status  HISTORY OF PRESENT ILLNESS:   Hx from ER staff, chart, and family.  46 yo [name] at [facility] from 10/20 to 10/Lisa with acute pancreatitis and delirium.  No clear cause of her pancreatitis was found.  After discharge home, the family reports that Lisa [name] was not compliant with her diabetes regimen.  She didn't have a means to test her blood sugars, and would sporadically use her insulin.  She became progressively more short of breath and nauseous over the past 2 days.  She was also getting dry heaves.  She started getting more confused.  She was brought to ER and found to have severe metabolic acidosis with hyperglycemia consistent with DKA.  SUBJECTIVE:   Extubated x 48 hours.  Asleep, per nursing moaning less. Follows selective commands. Word searching  VITAL SIGNS: BP (!) 160/74   Pulse (!) 102   Temp 99.5 F (37.5 C)   Resp (!) 24   Ht 5' 5" (1.651 m)   Wt 98 lb 5.2 oz (44.6 kg)   LMP 01/12/2011   SpO2 100%   BMI 16.36 kg/m   INTAKE / OUTPUT: I/O last 3 completed shifts: In: 6028.3 [I.V.:5400; NG/GT:578.3; IV Piggyback:50] Out: 24 [Urine:1205; Emesis/NG output:425]  PHYSICAL EXAMINATION: General - chronically ill appearing [name], supine in bed, asleep  on 2L [place], sat of 100% Eyes - PERRLA,  ENT - dry mucosa, no LAD, Chortrak Cardiac - S1, S2, RRR, Tachy to SR per tele, No RMG Chest - no wheeze, rales, + coarse,  No accessory muscle use Abd - soft, non tender, BS + Ext - 1+ edema RU extremity,  Lt BKA. Fine tremor of RUE.  Skin - warm, dry, and intact no rashes or lesions Neuro - opens eyes with stimulation,  following commands intermittently, + RUE weakness, moving R leg., No drift L arm , global aphasia  LABS:  BMET Recent Labs  Lab Lisa/02/18 0235 Lisa/03/18 0356  Lisa/04/18 0453  NA 144 141 142  K 3.8 4.8 3.6  CL 118* 113* 113*  CO2 19* 15* 16*  BUN 25* 18 20  CREATININE 1.Lisa* 1.10* 0.94  GLUCOSE 121* 336* 299*   Electrolytes Recent Labs  Lab Lisa/01/18 0459  Lisa/02/18 0235 Lisa/03/18 0356 Lisa/04/18 0453  CALCIUM 7.6*   < > 8.0* 7.9* 8.0*  MG 1.9  --  1.8  --  1.6*  PHOS 1.9*  --  3.5  --  2.4*   < > = values in this interval not displayed.   CBC Recent Labs  Lab Lisa/02/18 0235 Lisa/03/18 0356 Lisa/04/18 0453  WBC 19.0* 13.9* Lisa.5*  HGB 10.2* 9.6* 9.3*  HCT 30.0* 29.4* 28.2*  PLT 524* 365 359   Coag's No results for input(s): APTT, INR in the last 168 hours.  Sepsis Markers Recent Labs  Lab 11/17/16 1316 11/17/16 1737 Lisa/01/18 1246 Lisa/02/18 0235 Lisa/03/18 0356  LATICACIDVEN 2.93* 2.40*  --   --   --   PROCALCITON  --   --  0.60 0.32 <0.10   ABG Recent Labs  Lab 11/17/16 1100 11/17/16 1530 Lisa/02/18 0440  PHART 6.965* 7.122* 7.385  PCO2ART BELOW REPORTABLE RANGE BELOW REPORTABLE RANGE 33.7  PO2ART 156* 113* 123*   Liver Enzymes Recent Labs  Lab 11/17/16 1103  AST 45*  ALT 12*  ALKPHOS 110  BILITOT 1.8*  ALBUMIN 2.0*   Cardiac Enzymes No results for input(s): TROPONINI, PROBNP in the last 168 hours.  Glucose Recent Labs  Lab Lisa/03/18 1653 Lisa/03/18 1929 Lisa/03/18 2032 Lisa/04/18 0033 Lisa/04/18 0344 Lisa/04/18 0826  GLUCAP 83 69 148* 212* 244* 268*   Imaging Ct Angio Neck W Or Wo Contrast  Result Date: Lisa/04/2016 CLINICAL DATA:  46 year old  [name] with respiratory failure, presented with pancreatitis, DKA, and altered mental status. Found have acute to subacute left MCA territory infarcts and left ICA occlusion. EXAM: CT ANGIOGRAPHY NECK TECHNIQUE: Multidetector CT imaging of the neck was performed using the standard protocol during bolus administration of intravenous contrast. Multiplanar CT image reconstructions and MIPs were obtained to evaluate the vascular anatomy. Carotid stenosis measurements (when  applicable) are obtained utilizing NASCET criteria, using the distal internal carotid diameter as the denominator. CONTRAST:  50 mL Isovue 370 COMPARISON:  Brain MRI and intracranial MRA Lisa/01/2016. FINDINGS: Skeleton: Absent dentition. No acute osseous abnormality identified. Visualized paranasal sinuses and mastoids are stable and well pneumatized. Upper chest: Moderate to large bilateral layering pleural effusions. Evidence of compressive atelectasis, but also non dependent distal left upper lobe peribronchial opacity. Evidence of reactive mediastinal lymph nodes. Enteric tube courses into the thoracic esophagus. Other neck: Right nasoenteric tube in place. Otherwise negative. No cervical lymphadenopathy. Aortic arch: 3 vessel arch configuration. No significant arch atherosclerosis. Right carotid system: Mild soft plaque in the right CCA without stenosis. Mild soft and calcified plaque at the right ICA origin and bulb without stenosis. Left carotid system: Patent left CCA with only mild soft plaque. Patent left carotid bifurcation, but the left ICA occludes at the level of the bulb with a flame shaped termination (series 12, image 28). No reconstituted flow to the skullbase. Vertebral arteries:No proximal subclavian artery stenosis despite mild soft plaque. Both vertebral artery origins are normal. The left vertebral artery is dominant and patent to the skullbase without stenosis. The right vertebral artery is non dominant and diminutive but patent to the skullbase. Visible Intracranial: Posterior Circulation: The non dominant distal right vertebral artery functionally terminates in PICA. Dominant distal left vertebral artery with normal PICA origin. Left V4 segment calcified plaque with only mild stenosis. Patent vertebrobasilar junction. Visible basilar artery is patent without stenosis. Anterior circulation: The proximal left ICA siphon is appears occluded there is faint, thread-like reconstituted flow at the  level cavernous left ICA (series 7, image 4). At the same time the right ICA siphon is circumferentially narrow did at the level of the proximal cavernous segment as seen on series 7, image 5. Right ICA siphon stenosis was also visible on the comparison MRA. Furthermore, the MRA revealed a prominent right posterior communicating artery above which the right ICA terminus had a more normal caliber. Review of the MIP images confirms the above findings IMPRESSION: 1. Occluded Left ICA in the neck at the level of the bulb. Flame shaped termination of the vessel at the bulb such that differential considerations for occlusion include a left ICA dissection (however, see also #2). Faint, thread-like reconstitution only at the visible left cavernous ICA. 2. Patent cervical right ICA with only mild atherosclerosis in the neck, however, moderate to severe circumferential tapering of the vessel - apparently due to soft plaque - is noted in the visible right ICA siphon. And the recent MRA demonstrated similar right ICA siphon stenosis with collateral supply to the right ICA terminus from the Right Pcomm. Therefore, severe soft atherosclerotic plaque might  be the more likely explanation for the left ICA occlusion in #1. 3. Moderate to large bilateral layering pleural effusions. 4. Superimposed patchy peripheral left upper lobe opacity. Consider developing acute left lung infection. 5. Normal vertebral arteries, the left is dominant. Electronically Signed   By: Genevie Ann M.D.   On: Lisa/04/2016 10:36   Dg Chest Port 1 View  Result Date: Lisa/04/2016 CLINICAL DATA:  46 year old [name] with respiratory failure, presented with pancreatitis, DKA, and altered mental status. Found have acute to subacute left MCA territory infarcts. EXAM: PORTABLE CHEST 1 VIEW COMPARISON:  Lisa/02/2016 and earlier. FINDINGS: Portable AP semi upright view at 0447 hours. Extubated. Enteric feeding tube in place, tip not included. Increased mild veiling opacity  at both lung bases. Stable cardiac size and mediastinal contours. Stable pulmonary vascularity without edema. No pneumothorax. No consolidation. Coronary artery stent again noted. Small volume barium at the splenic flexure (s/p modified barium swallow yesterday). IMPRESSION: 1. Extubated.  Feeding tube in place. 2. Increased veiling opacity at both lung bases suggesting small pleural effusions and/or atelectasis. Electronically Signed   By: Genevie Ann M.D.   On: Lisa/04/2016 07:39   Dg Swallowing Func-speech Pathology  Result Date: Lisa/03/2016 Objective Swallowing Evaluation: Type of Study: MBS-Modified Barium Swallow Study Lisa [name] Details Name: LYDIANN BONIFAS MRN: [id number] Date of Birth: [birth year] Today's Date: Lisa/03/2016 Time: SLP Start Time (ACUTE ONLY): 1118-SLP Stop Time (ACUTE ONLY): 1133 SLP Time Calculation (min) (ACUTE ONLY): 15 min Past Medical History: Past Medical History: Diagnosis Date . Anxiety  . 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 Coronary atherosclerosis of native coronary artery   DES to LAD September 2014 . Depression  . Facial cellulitis   12/2010 . Glomerulonephritis  . Headache(784.0)  . History of stroke  . Hyperlipidemia  . Insulin dependent diabetes mellitus ([place])   History of diabetic ketoacidosis . Ischemic cardiomyopathy   LVEF 40-45% . Peripheral arterial disease ([place])  . Shingles   Lisa/2012 . ST elevation myocardial infarction (STEMI) of anterior wall [facility])   Late presentation September 2014 Past Surgical History: Past Surgical History: Procedure Laterality Date . AMPUTATION  03/03/2011  Procedure: AMPUTATION DIGIT;  Surgeon: Newt Minion, MD;  Location: [place];  Service: Orthopedics;  Laterality: Left;  Left Foot Amputation 4th and 5th toes at MTP joint . AMPUTATION  04/22/2011  Procedure: AMPUTATION BELOW KNEE;  Surgeon: Newt Minion, MD;  Location: [place];  Service: Orthopedics;  Laterality: Left;  Left Below Knee Amputation . DILATION AND  CURETTAGE OF UTERUS   . EMBOLECTOMY  1/Lisa/2013  Procedure: EMBOLECTOMY;  Surgeon: Mal Misty, MD;  Location: [facility] OR;  Service: Vascular;  Laterality: Left;  Left Popliteal and Tibial Embolectomy with patch angioplasty . INCISION AND DRAINAGE ABSCESS N/A 04/06/2016  Procedure: INCISION AND DRAINAGE ABSCESS;  Surgeon: Jonnie Kind, MD;  Location: [facility];  Service: Gynecology;  Laterality: N/A; . LEFT HEART CATHETERIZATION WITH CORONARY ANGIOGRAM N/A 10/01/2012  Procedure: LEFT HEART CATHETERIZATION WITH CORONARY ANGIOGRAM;  Surgeon: Peter M Martinique, MD;  Location: [facility] CATH LAB;  Service: Cardiovascular;  Laterality: N/A; . LOWER EXTREMITY ANGIOGRAM N/A 01/27/2011  Procedure: LOWER EXTREMITY ANGIOGRAM;  Surgeon: Rosetta Posner, MD;  Location: [facility] CATH LAB;  Service: Cardiovascular;  Laterality: N/A; . LOWER EXTREMITY ANGIOGRAM N/A 01/28/2011  Procedure: LOWER EXTREMITY ANGIOGRAM;  Surgeon: Conrad , MD;  Location: [facility] CATH LAB;  Service: Cardiovascular;  Laterality: N/A; . LOWER EXTREMITY ANGIOGRAM Left 1/Lisa/2013  Procedure: LOWER  EXTREMITY ANGIOGRAM;  Surgeon: Mal Misty, MD;  Location: [facility] CATH LAB;  Service: Cardiovascular;  Laterality: Left; Marland Kitchen MULTIPLE TOOTH EXTRACTIONS   . TEE WITHOUT CARDIOVERSION  04/15/2011  Procedure: TRANSESOPHAGEAL ECHOCARDIOGRAM (TEE);  Surgeon: [name], MD;  [facility];  Service: Cardiovascular;  Laterality: N/A; . WRIST SURGERY    Left HPI: Lisa [name] is a 46 yo [name] at [facility] from 10/20 to 10/Lisa with acute pancreatitis and delirium. No clear cause of her pancreatitis was found. After discharge home, the family reports that Lisa [name] was not compliant with her diabetes regimen. She didn't have a means to test her blood sugars, and would sporadically use her insulin. She became progressively more short of breath and nauseous over the past 2 days. She was also getting dry heaves. She started getting more confused. She was brought to ER and found to have severe  metabolic acidosis with hyperglycemia consistent with DKA. MRI of head reveals Large areas of late acute/early subacute infarction within the left MCA distribution as well as multiple chronic infarcts. Intubated Lisa/1 to Lisa/2. Chest x ray without acute cardiopulmonary disease.  Subjective: Lisa [name] nods head when questioned if she understands SLP Assessment / Plan / Recommendation CHL IP CLINICAL IMPRESSIONS Lisa/03/2016 Clinical Impression Lisa [name] presents with suspected moderate oropharyngeal dysphagia with sensory and motor impairments secondary to neurologic deficits and recent, brief intubation. This objective study was very limited as Lisa [name] began gagging when 3rd bolus was administered. Lisa [name] grew very anxious, moaning in pain and was unable to continue the study. Despite limited observation, noted oral weakness resulting in decreased bolus cohesion, piecemeal swallowing. Tongue base retraction, hyolaryngeal excursion and pharyngeal constriction reduced, resulting in mild-moderate pharyngeal residue. Swallow initiation delayed with puree, with reversible penetration during the swallow. Lisa [name] coughing between frames; no observed aspiration but ? reduced airway protection with secretions, possibly diluted residue. As teaspoon bolus of honey was administered, Lisa [name] began gagging, with backflow of vallecular puree residue into the oral cavity, which Lisa [name] expectorated along with honey-thick bolus. Lisa [name] began crying, moaning, HR elevated. SLP provided encouragement, relaxation techniques. Lisa [name] calmed slightly but she continued moaning and was unable to continue the exam. Given her clinical signs of aspiration at bedside and observed impairments in this brief exam, she is at moderate-severe risk for aspiration. Recommend she remain NPO until she is able to participate in repeat instrumental exam; will follow to determine readiness for repeat MBS or possible FEES Monday. Overall prognosis good, though she will likely need temporary means of  alternative nutrition. SLP Visit Diagnosis Dysphagia, oropharyngeal phase (R13.12) Attention and concentration deficit following -- Frontal lobe and executive function deficit following -- Impact on safety and function Moderate aspiration risk;Severe aspiration risk;Other (comment)   CHL IP TREATMENT RECOMMENDATION Lisa/03/2016 Treatment Recommendations F/U MBS in --- days (Comment);F/U FEES in --- days (Comment)   Prognosis Lisa/03/2016 Prognosis for Safe Diet Advancement Good Barriers to Reach Goals Language deficits;Time post onset;Severity of deficits Barriers/Prognosis Comment -- CHL IP DIET RECOMMENDATION Lisa/03/2016 SLP Diet Recommendations Alternative means - temporary;NPO Liquid Administration via -- Medication Administration Via alternative means Compensations -- Postural Changes --   CHL IP OTHER RECOMMENDATIONS Lisa/03/2016 Recommended Consults -- Oral Care Recommendations Oral care QID Other Recommendations --   CHL IP FOLLOW UP RECOMMENDATIONS Lisa/03/2016 Follow up Recommendations Inpatient Rehab   CHL IP FREQUENCY AND DURATION Lisa/03/2016 Speech Therapy Frequency (ACUTE ONLY) min 2x/week Treatment Duration 2 weeks      CHL IP ORAL PHASE Lisa/03/2016 Oral Phase Impaired  Oral - Pudding Teaspoon -- Oral - Pudding Cup -- Oral - Honey Teaspoon Right anterior bolus loss;Other (Comment) Oral - Honey Cup -- Oral - Nectar Teaspoon -- Oral - Nectar Cup -- Oral - Nectar Straw -- Oral - Thin Teaspoon -- Oral - Thin Cup -- Oral - Thin Straw -- Oral - Puree Piecemeal swallowing;Delayed oral transit;Decreased bolus cohesion;Pocketing in anterior sulcus Oral - Mech Soft -- Oral - Regular -- Oral - Multi-Consistency -- Oral - Pill -- Oral Phase - Comment --  CHL IP PHARYNGEAL PHASE Lisa/03/2016 Pharyngeal Phase Impaired Pharyngeal- Pudding Teaspoon -- Pharyngeal -- Pharyngeal- Pudding Cup -- Pharyngeal -- Pharyngeal- Honey Teaspoon -- Pharyngeal -- Pharyngeal- Honey Cup -- Pharyngeal -- Pharyngeal- Nectar Teaspoon -- Pharyngeal --  Pharyngeal- Nectar Cup -- Pharyngeal -- Pharyngeal- Nectar Straw -- Pharyngeal -- Pharyngeal- Thin Teaspoon -- Pharyngeal -- Pharyngeal- Thin Cup -- Pharyngeal -- Pharyngeal- Thin Straw -- Pharyngeal -- Pharyngeal- Puree Reduced pharyngeal peristalsis;Reduced anterior laryngeal mobility;Reduced laryngeal elevation;Reduced tongue base retraction;Pharyngeal residue - valleculae;Penetration/Aspiration during swallow;Delayed swallow initiation-vallecula Pharyngeal Material enters airway, remains ABOVE vocal cords then ejected out Pharyngeal- Mechanical Soft -- Pharyngeal -- Pharyngeal- Regular -- Pharyngeal -- Pharyngeal- Multi-consistency -- Pharyngeal -- Pharyngeal- Pill -- Pharyngeal -- Pharyngeal Comment --  No flowsheet data found. No flowsheet data found. Aliene Altes Lisa/03/2016, 3:20 PM  Deneise Lever, MS, CCC-SLP Speech-Language Pathologist [phone]            STUDIES:  CT head 10/31 >> Old Rt frontal infarct, Age indeterminate infarct Lt parietal lobe, chronic microvascular disease, cerebral atrophy MRI Brain11/1>> Large areas of late acute/early subacute infarction within the left MCA distribution. No associated hemorrhage. Mild mass effect with partial effacement of left lateral ventricle and 3 mm left-to-right midline shift. Left internal carotid artery occlusion with partial reconstitution of flow the level of the terminus. Asymmetric diminished flow related signal in left MCA distribution in comparison with right. EEG>> EEG is abnormal due to the presence of moderate diffuse background slowing. Findings indicate diffuse cerebral dysfunction that is non-specific in etiology and can be seen with hypoxic/ischemic injury, toxic/metabolic encephalopathies, or medication effect.  The absence of epileptiform discharges does not rule out a clinical diagnosis of epilepsy.   CTA Neck with contrast Lisa/4 >>Occluded Left ICA in the neck at the level of the bulb. Flame shaped termination of the vessel at  the bulb such that differential considerations for occlusion include a left ICA dissection (however, see also #2). Faint, thread-like reconstitution only at the visible left cavernous ICA. Patent cervical right ICA with only mild atherosclerosis in the neck, however, moderate to severe circumferential tapering of the vessel -  due to soft plaque - is noted in the visible right ICA siphon. And the recent MRA demonstrated similar right ICA siphon stenosis with collateral supply to the right ICA terminus from the Right Pcomm. Therefore, severe soft atherosclerotic plaque might be the more likely explanation for the left ICA occlusion in Moderate to large bilateral layering pleural effusions. Superimposed patchy peripheral left upper lobe opacity. Consider developing acute left lung infection. SIGNIFICANT EVENTS: 10/31 Admit Lisa/1 BG improving, notable RUE weakness and tremor, plan for MRI brain looking for CVA vs structural causes for tremor Lisa/2 >> Extubated  DISCUSSION: 46 yo [name] with DKA with coma likely related to medical non compliance, with imaging consistent with Left MCA large stroke likely secondary to large vessel disease with left ICA occlusion. Etiology likely due to known hx of thrombophilia. ( Protein C and  Protein S deficiency, positive Lupus anticoagulant testing)    ASSESSMENT / PLAN:  Pulmonary:  Moderate to large bilateral layering pleural effusions per CT Lisa/4>>. Superimposed patchy peripheral left upper lobe opacity. ? developing acute left lung infection. Remains on 2L Net + 12 L Plan: - Consider Lasix 20 mg x 1 ( small effusions) - Mobilize/ OOB - Agressive Pulmonary Toilet as able  - Follow CXR - Titrate oxygen for saturations >92%  Infectious Leukocytosis T Max 100.6 Plan: Follow CXR Trend fever curve/ WBC Follow Micro BC>> no growth to date Culture as is clinically indicated  DKA, resolved. Poor compliance with insulin regimen. On with hypoglycemia protocol.   HGB A1c 14.3 - Lantus 10 units daily - CBG Q 4 - sensitive  SSI  - Case Management Consult to ensure Lisa [name] has supplies to manage DM once discharged>>  with stroke and    poor medication compliance>> will most likely need long term  placement   Hypokalemia in setting of DKA>> resolved.  Hypomag , Hypo phos Lisa/4  - monitor heart rhythm - f/u BMET Lisa/4 - replete lytes as needed  Acute renal failure with ATN >> baseline creatinine 0.72 from 11/08/16. Continues to improve.  - Trend BMET - Avoid nephrotoxic drugs - Monitor UO - replete electrolytes as needed  Large MCA infarct: given RUE weakness and AMS, Expressive aphasia, suspected new CVA based on CT head from ED. MRI confirmed large MCA infarct. Global aphasia RUE  plegia - consult stroke team - Lisa [name]/OT/SLP>> Swallow study pending Lisa/3>> Chortrak placed - hold outpt xanax, percocet, neurontin - monitor for opioid withdrawal>> prn ativan - EEG results as above - Resume Eliquis Lisa/Lisa ( per neuro note Lisa/4)  Hx of CAD s/p stenting. Hx of thromboembolism, HLD, PAD. HTN - Telemetry monitoring - Maintain MAP > 65 - Permissive hypertension < 220/120, but normalizw within 5-7 days with long term BP goal of normotesive - hold outpt eliquis for now>> resume Lisa/Lisa per neuro - Echo pending - Continue  Statin  Recent episode of pancreatitis. Severe protein calorie malnutrition. - lipase only 78 now >> was 1,797 during last admission at [facility] - prn zofran for nausea - Nutrition post swallow per feeding tube  Anemia of critical illness and chronic disease. - f/u CBC - Monitor for obvious source of bleeding - Transfuse for HGB < 7  DVT prophylaxis - SQ heparin SUP - not indicated Diet - NPO>> swallow eval in process Goals of care - full code - Palliative care met with family, full code status.  Magdalen Spatz, AGACNP-BC [place] Pager # [phone] Lisa/04/2016 Lisa:24 am  Attending Note:  46  year old [name] with PMH of IDDM since age 59 with all its associated complication who recently had an embolic stroke with severely altered mental status who is now protecting her airway on exam with clear lungs.  I reviewed CXR myself, lungs are clear.  Discussed with PCCM-NP.  Lisa [name] remains very confused.  Neurology feels that given location and size of the stroke Lisa [name] has a very high chance of deterioration so will hold in the ICU overnight.  Stabilize her vital signs as able.  Replace electrolytes.  BMET in AM.  KVO IVF.  The Lisa [name] is critically ill with multiple organ systems failure and requires high complexity decision making for assessment and support, frequent evaluation and titration of therapies, application of advanced monitoring technologies and extensive interpretation of multiple databases.   Critical Care Time devoted to Lisa [name]  care services described in this note is  35  Minutes. This time reflects time of care of this signee Dr Jennet Maduro. This critical care time does not reflect procedure time, or teaching time or supervisory time of PA/NP/Med student/Med Resident etc but could involve care discussion time.  Rush Farmer, M.D. [facility] Pulmonary/Critical Care Medicine. Pager: [phone]. After hours pager: [phone].

## 2016-11-21 NOTE — Progress Notes (Addendum)
STROKE TEAM PROGRESS NOTE   SUBJECTIVE (INTERVAL HISTORY) No family is at the bedside.  She is lethargic but arousable, she is aphasic but continue to cry with "ohhh, ohhh". Left gaze preference and right neglect, not able to move right UE, but spontaneous on the right LE. CUS showed left ICA occlusion. Cre normalized and CTA neck confirmed left ICA occlusion but not much atherosclerosis, consistent with embolic pattern. On cortrak and TF. BP stable.   OBJECTIVE Temp:  [97.5 F (36.4 C)-100.6 F (38.1 C)] 100 F (37.8 C) (11/04 0600) Pulse Rate:  [78-114] 110 (11/04 0600) Cardiac Rhythm: Normal sinus rhythm (11/03 2000) Resp:  [12-28] 24 (11/04 0600) BP: (117-158)/(58-97) 155/71 (11/04 0600) SpO2:  [99 %-100 %] 100 % (11/04 0600) Weight:  [98 lb 5.2 oz (44.6 kg)] 98 lb 5.2 oz (44.6 kg) (11/04 0449)  CBC:  Recent Labs  Lab 11/20/16 0356 11/21/16 0453  WBC 13.9* 11.5*  HGB 9.6* 9.3*  HCT 29.4* 28.2*  MCV 84.2 84.2  PLT 365 149    Basic Metabolic Panel:  Recent Labs  Lab 11/19/16 0235 11/20/16 0356 11/21/16 0453  NA 144 141 142  K 3.8 4.8 3.6  CL 118* 113* 113*  CO2 19* 15* 16*  GLUCOSE 121* 336* 299*  BUN 25* 18 20  CREATININE 1.27* 1.10* 0.94  CALCIUM 8.0* 7.9* 8.0*  MG 1.8  --  1.6*  PHOS 3.5  --  2.4*    Lipid Panel:     Component Value Date/Time   CHOL 162 11/07/2016 0722   TRIG 152 (H) 11/07/2016 0722   HDL 39 (L) 11/07/2016 0722   CHOLHDL 4.2 11/07/2016 0722   VLDL 30 11/07/2016 0722   LDLCALC 93 11/07/2016 0722   HgbA1c:  Lab Results  Component Value Date   HGBA1C 13.6 (H) 11/21/2016   Urine Drug Screen:     Component Value Date/Time   LABOPIA NONE DETECTED 09/30/2012 1959   COCAINSCRNUR NONE DETECTED 09/30/2012 1959   LABBENZ POSITIVE (A) 09/30/2012 1959   AMPHETMU NONE DETECTED 09/30/2012 1959   THCU NONE DETECTED 09/30/2012 1959   LABBARB NONE DETECTED 09/30/2012 1959    Alcohol Level     Component Value Date/Time   ETH <10  11/17/2016 1103    IMAGING I have personally reviewed the radiological images below and agree with the radiology interpretations.  CT head Old right frontal infarct. New low-density scratched at low-density area in the left parietal lobe compatible with infarct, age indeterminate, new since 2014. Atrophy, chronic microvascular disease.  Mr [name] Head Wo Contrast 11/18/2016 IMPRESSION:  1. Large areas of late acute/early subacute infarction within the left MCA distribution. No associated hemorrhage. Mild mass effect with partial effacement of left lateral ventricle and 3 mm left-to-right midline shift.  2. Left internal carotid artery occlusion with partial reconstitution of flow the level of the terminus. Asymmetric diminished flow related signal in left MCA distribution in comparison with right.  3. Multiple chronic infarctions as above.   Transthoracic Echocardiogram - pending  Bilateral Carotid Dopplers - Occluded left ICA was seen, no abnormalities detected on the right Carotid artery.  CTA neck - pending   PHYSICAL EXAM Vitals:   11/21/16 0400 11/21/16 0449 11/21/16 0500 11/21/16 0600  BP: (!) 117/97  (!) 146/85 (!) 155/71  Pulse: (!) 109  (!) 106 (!) 110  Resp: 20  (!) 27 (!) 24  Temp: 100 F (37.8 C)  99.9 F (37.7 C) 100 F (37.8 C)  TempSrc:  SpO2: 100%  100% 100%  Weight:  98 lb 5.2 oz (44.6 kg)    Height:        Temp:  [97.5 F (36.4 C)-100.6 F (38.1 C)] 100 F (37.8 C) (11/04 0600) Pulse Rate:  [78-114] 110 (11/04 0600) Resp:  [12-28] 24 (11/04 0600) BP: (117-158)/(58-97) 155/71 (11/04 0600) SpO2:  [99 %-100 %] 100 % (11/04 0600) Weight:  [98 lb 5.2 oz (44.6 kg)] 98 lb 5.2 oz (44.6 kg) (11/04 0449)  General - thin, well developed, crying "ohhh, ohhh' on movement of limbs  Ophthalmologic - Fundi not visualized due to noncooperation.  Cardiovascular - Regular rate and rhythm.  Neuro - awake alert but global aphasia, not following commands, no  speech output. Appears to have right side neglect. Blinking to visual threat on the left but not on the right. PERRL, left gaze preference but able to cross midline, incomplete on right gaze. Facial seems to have left facial weakness, tongue midline in mouth. RUE and RLE spontaneous movement with RLE BKA. LUE 0/5 and no movement on pain, LLE brisk movement on pain and with spontaneous movement. Sensation, coordination not cooperative and gait not tested.  ASSESSMENT/PLAN [name] is a 46 y.o. female with history of previous strokes, diabetes mellitus, tobacco use, coronary artery disease with previous MI, peripheral arterial disease, ischemic cardiomyopathy, hyperlipidemia, medical noncompliance, anxiety, depression, and a history of pancreatitis presenting with altered mental status. She did not receive IV t-PA due to unknown time of onset.  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 Etiology likely due to known hx of thrombophilia.   Resultant global aphasia, right UE plegia  CT head -acute left MCA infarct, old right MCA, ACA infarcts  MRI head - Large areas of late acute/early subacute infarction within the left MCA distribution.   Partial effacement of left lateral ventricle and 3 mm left-to-right midline shift.   MRA head - Lt ICA occlusion with partial reconstitution. Diminished flow in left MCA distribution.  CTA neck pending  Carotid Doppler - left ICA occlusion  2D Echo - pending  LDL - 93  HgbA1c - 14.3  VTE prophylaxis - Subcutaneous heparin Diet NPO time specified  No antithrombotic prior to admission, now on aspirin 325 mg daily. 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  03/5571 - left leg ischemic change, embolic events, s/p left LE thrombectomy -put on Coumadin  03/2011 - multifocal infarcts involving right MCA/ACA, right MCA, right cerebellum, embolic pattern, EEG negative for seizure, TEE and TTE negative - hypercoagulable workup showed protein C and protein S deficiency and positive lupus anticoagulant testing, negative for anticardiolipin and anti-beta-2 glycoprotein antibodies - continue the Coumadin and add Plavix   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, continue Coumadin, Plavix discontinued  Patient was on Eliquis PTA - likely noncompliant  Continue ASA for now - resume eliquis in one week  Left ICA occlusion, 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 and noncompliant with anticoagulation  MRA nonvisualization of left ICA  Carotid Doppler left ICA occlusion  CTA of the neck pending - pattern consistent with embolic event  Continue ASA for now - resume eliquis in one week  Uncontrolled diabetes  HgbA1c 14.3, goal < 7.0  Uncontrolled  11/06/16 - 11/13/16 admitted to [facility]  for DKA  History of left foot infection with progression ended up with left BKA  SSI and CBG monitoring  On Lantus  Management per primary team  Hypertension  Stable  Permissive hypertension (OK if < 220/120) but gradually normalize in 5-7 days  Long-term BP goal normotensive  Hyperlipidemia  Home meds: No lipid lowering medications prior to admission  LDL 93, goal < 70  Lipitor 80 when taking PO meds.  Continue statin at discharge  AKI   Creatinine 1.79-> 1.47-> 1.27->0.94  Likely due to DKA and dehydration  Continue TF  UTI with leukocytosis, improving  WBC 23.6-> 19.0->13.9->11.5  Urine culture - > 100,000 lactobacillus  On fluconazole  Tobacco abuse  Current smoker  Smoking cessation counseling will be provided  Other Stroke Risk Factors  Family hx stroke (Mother)  CAD/MI 2014 s/p stent  PAD  History of drug use  Medical noncompliance  Other  Active Problems  Fever, resolved  Anemia of chronic disease - 9.6->9.3  ? Xanax withdrawal  Left BKA  Hospital day # 4  This patient is critically ill due to left MCA large stroke, DKA, left ICA occlusion, UTI with leukocytosis, AKI and thrombophilia and at significant risk of neurological worsening, death form recurrent stroke, hemorrhagic conversion, DKA, sepsis, kidney failure, respiratory failure and heart failure. This patient's care requires constant monitoring of vital signs, hemodynamics, respiratory and cardiac monitoring, review of multiple databases, neurological assessment, discussion with family, other specialists and medical decision making of high complexity. I spent 35 minutes of neurocritical care time in the care of this patient.  Rosalin Hawking, MD PhD Stroke Neurology 11/21/2016 10:42 AM  To contact Stroke Continuity provider, please refer to [email or web address] After hours, contact General Neurology

## 2016-11-21 NOTE — Progress Notes (Signed)
  Echocardiogram 2D Echocardiogram has been performed.  [name] 11/21/2016, 1:36 PM

## 2016-11-22 ENCOUNTER — Inpatient Hospital Stay (HOSPITAL_COMMUNITY): Payer: Medicaid Other

## 2016-11-22 DIAGNOSIS — I2510 Atherosclerotic heart disease of native coronary artery without angina pectoris: Secondary | ICD-10-CM

## 2016-11-22 LAB — CBC
HCT: 28.8 % — ABNORMAL LOW (ref 36.0–46.0)
Hemoglobin: 9.3 g/dL — ABNORMAL LOW (ref 12.0–15.0)
MCH: 27.4 pg (ref 26.0–34.0)
MCHC: 32.3 g/dL (ref 30.0–36.0)
MCV: 85 fL (ref 78.0–100.0)
Platelets: 234 10*3/uL (ref 150–400)
RBC: 3.39 MIL/uL — ABNORMAL LOW (ref 3.87–5.11)
RDW: 15.3 % (ref 11.5–15.5)
WBC: 9.2 10*3/uL (ref 4.0–10.5)

## 2016-11-22 LAB — GLUCOSE, CAPILLARY
Glucose-Capillary: 162 mg/dL — ABNORMAL HIGH (ref 65–99)
Glucose-Capillary: 172 mg/dL — ABNORMAL HIGH (ref 65–99)
Glucose-Capillary: 201 mg/dL — ABNORMAL HIGH (ref 65–99)
Glucose-Capillary: 210 mg/dL — ABNORMAL HIGH (ref 65–99)
Glucose-Capillary: 211 mg/dL — ABNORMAL HIGH (ref 65–99)
Glucose-Capillary: 70 mg/dL (ref 65–99)

## 2016-11-22 LAB — VAS US CAROTID
LEFT ECA DIAS: -16 cm/s
LEFT VERTEBRAL DIAS: -19 cm/s
Left CCA dist dias: 5 cm/s
Left CCA dist sys: 69 cm/s
Left CCA prox dias: 4 cm/s
Left CCA prox sys: 69 cm/s
RIGHT ECA DIAS: -21 cm/s
RIGHT VERTEBRAL DIAS: 13 cm/s
Right CCA prox dias: 16 cm/s
Right CCA prox sys: 83 cm/s
Right cca dist sys: -86 cm/s

## 2016-11-22 LAB — BASIC METABOLIC PANEL
Anion gap: 9 (ref 5–15)
BUN: 16 mg/dL (ref 6–20)
CO2: 19 mmol/L — ABNORMAL LOW (ref 22–32)
Calcium: 7.9 mg/dL — ABNORMAL LOW (ref 8.9–10.3)
Chloride: 116 mmol/L — ABNORMAL HIGH (ref 101–111)
Creatinine, Ser: 0.89 mg/dL (ref 0.44–1.00)
GFR calc Af Amer: >60 mL/min (ref >60)
GFR calc non Af Amer: >60 mL/min (ref >60)
Glucose, Bld: 199 mg/dL — ABNORMAL HIGH (ref 65–99)
Potassium: 3.5 mmol/L (ref 3.5–5.1)
Sodium: 144 mmol/L (ref 135–145)

## 2016-11-22 LAB — MAGNESIUM: Magnesium: 1.8 mg/dL (ref 1.7–2.4)

## 2016-11-22 LAB — PHOSPHORUS: Phosphorus: 1.9 mg/dL — ABNORMAL LOW (ref 2.5–4.6)

## 2016-11-22 MED ORDER — ASPIRIN 81 MG PO CHEW
324.0000 mg | CHEWABLE_TABLET | Freq: Every day | ORAL | Status: DC
  Administered 2016-11-23 – 2016-11-24 (×2): 324 mg
  Filled 2016-11-22 (×2): qty 4

## 2016-11-22 MED ORDER — GLUCERNA 1.2 CAL PO LIQD
1000.0000 mL | ORAL | Status: DC
  Administered 2016-11-22: 1000 mL
  Filled 2016-11-22 (×6): qty 1000

## 2016-11-22 MED ORDER — CEPHALEXIN 250 MG/5ML PO SUSR
500.0000 mg | Freq: Two times a day (BID) | ORAL | Status: DC
  Administered 2016-11-22 – 2016-11-24 (×4): 500 mg
  Filled 2016-11-22 (×7): qty 10

## 2016-11-22 MED ORDER — FLUCONAZOLE 40 MG/ML PO SUSR
100.0000 mg | Freq: Every day | ORAL | Status: DC
  Administered 2016-11-23 – 2016-11-24 (×2): 100 mg
  Filled 2016-11-22 (×2): qty 2.5

## 2016-11-22 NOTE — Consult Note (Signed)
Cardiology Consultation:   Patient ID: [name]; [id number]; Nov 07, 1970   Admit date: 11/17/2016 Date of Consult: 11/22/2016  Primary Care Provider: Sinda Du, MD Primary Cardiologist: Dr. Domenic Polite (last seen 10/2014)   Patient Profile:   [name] is a 46 y.o. female with a hx of CAD s/p DES to LAD in 2014, PVD, s/p L BKA,  Prior embolectomy (previously on coumadin), tobacco abuse and CVA who is being seen today for the evaluation of CHF at the request of Dr. Lavina Hamman.  Hx of anterior STEMI 09/2012 with late presentation s/p DES to proximal to mid LAD. Medical tx for moderate disease at the ostium of the ramus intermediate branch. LVEF was 40/45% at that time. Subsequent echo 10/2014 (LAST) showed improved LVEF to 60-65% and finding cannot exclude PFO.   Admitted 11/06/16-11/13/16 for acute pancreatitis and encephalopathy.   History of Present Illness:   [name] presented to ER 11/17/16 with AMS. Found to have severe metabolic acidosis with DKA in setting of non compliant with diabetic medications. Also noted to have AKI. Noted right side tremors on 11/18/16. MRI of brain showed acute/subacute Left MCA infract. CTA neck confirmed left ICA occlusion. Neurology has recommended ASA for now and then resume eliquis in one week post stroke if no planned procedure.   Echo this admission showed LVEF of 30-35% (down from 2016) with Akinesis of the distal septum and apex. mild diastolic dysfunction; mild LVH;  prominent apical trabeculae but no clear thrombus; mild MR. Cardiology is asked for further management.     Past Medical History:  Diagnosis Date  . Anxiety   . 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 Coronary atherosclerosis of native coronary artery    DES to LAD September 2014  . Depression   . Facial cellulitis    12/2010  . Glomerulonephritis   . Headache(784.0)   . History of stroke   . Hyperlipidemia   . Insulin  dependent diabetes mellitus ([place])    History of diabetic ketoacidosis  . Ischemic cardiomyopathy    LVEF 40-45%  . Peripheral arterial disease ([place])   . Shingles    11/2010  . ST elevation myocardial infarction (STEMI) of anterior wall [facility])    Late presentation September 2014    Past Surgical History:  Procedure Laterality Date  . DILATION AND CURETTAGE OF UTERUS    . MULTIPLE TOOTH EXTRACTIONS    . WRIST SURGERY     Left      Inpatient Medications: Scheduled Meds: . aspirin EC  325 mg Oral Daily   Or  . aspirin  300 mg Rectal Daily  . atorvastatin  80 mg Oral q1800  . [START ON 11/23/2016] fluconazole  100 mg Per Tube Daily  . heparin  5,000 Units Subcutaneous Q8H  . insulin aspart  0-9 Units Subcutaneous Q4H  . insulin glargine  10 Units Subcutaneous Daily  . mouth rinse  15 mL Mouth Rinse BID   Continuous Infusions: . feeding supplement (GLUCERNA 1.2 CAL)    . lactated ringers 10 mL/hr at 11/22/16 0956   PRN Meds: acetaminophen, fentaNYL (SUBLIMAZE) injection, LORazepam, ondansetron (ZOFRAN) IV  Allergies:    Allergies  Allergen Reactions  . Ibuprofen Other (See Comments)    Kidney disease  . Losartan Swelling    Social History:   Social History   Socioeconomic History  . Marital status: Single    Spouse name: Not on file  . Number of  children: Not on file  . Years of education: Not on file  . Highest education level: Not on file  Social Needs  . Financial resource strain: Not on file  . Food insecurity - worry: Not on file  . Food insecurity - inability: Not on file  . Transportation needs - medical: Not on file  . Transportation needs - non-medical: Not on file  Occupational History  . Not on file  Tobacco Use  . Smoking status: Current Every Day Smoker    Packs/day: 0.50    Years: 21.00    Pack years: 10.50    Types: Cigarettes    Start date: 07/06/1989    Last attempt to quit: 01/30/2011    Years since quitting: 5.8  . Smokeless tobacco:  Never Used  Substance and Sexual Activity  . Alcohol use: No    Alcohol/week: 0.0 oz    Comment: rarely  . Drug use: Yes    Frequency: 1.0 times per week    Types: Marijuana    Comment: Former  . Sexual activity: Yes    Partners: Male    Comment: pt stated that her tubal was only 50% effective- "not cut and burned"  Other Topics Concern  . Not on file  Social History Narrative   Unemployed.  Lives in [place] with [place], [place], and mother.  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    Family History:    Family History  Problem Relation Age of Onset  . COPD Mother        alive - 58  . Hypertension Mother   . Diabetes Mother   . Stroke Mother   . Coronary artery disease Mother   . Diabetes Father        alive - 54  . Hypertension Father   . Coronary artery disease Father   . Anesthesia problems Neg Hx      ROS:  Please see the history of present illness.  ROS All other ROS reviewed and negative.     Physical Exam/Data:   Vitals:   11/22/16 0800 11/22/16 0845 11/22/16 0900 11/22/16 1000  BP: 111/73   108/69  Pulse: (!) 108  97 (!) 102  Resp: (!) 26  20 18   Temp:  99.9 F (37.7 C)  99.9 F (37.7 C)  TempSrc:  Core (Comment)    SpO2: 96%  96% 96%  Weight:      Height:        Intake/Output Summary (Last 24 hours) at 11/22/2016 1503 Last data filed at 11/22/2016 1000 Gross per 24 hour  Intake 3500 ml  Output 1440 ml  Net 2060 ml   Filed Weights   11/19/16 0500 11/21/16 0449 11/22/16 0450  Weight: 88 lb 6.5 oz (40.1 kg) 98 lb 5.2 oz (44.6 kg) 112 lb 3.4 oz (50.9 kg)   Body mass index is 18.67 kg/m.  General:  Ill appearing thin frail female in NAD HEENT: normal Lymph: no adenopathy Neck: [name] Endocrine:  No thryomegaly Vascular: No carotid bruits; FA pulses 2+ bilaterally without bruits  Cardiac:  normal S1, S2; RRR; no murmur Lungs:  clear to auscultation bilaterally, no wheezing, rhonchi or rales  Abd: soft, nontender, no hepatomegaly  Ext:  S/p L BKA, mild edema on RUE Musculoskeletal:  No deformities, BUE and BLE strength normal and equal Skin: warm and dry  Neuro:  Aphasic, not able to move RUE Psych:  aohasic  EKG:  The EKG was personally reviewed and  demonstrates:  11/17/16: SR with non specific ST/T wave changes Telemetry:  Telemetry was personally reviewed and demonstrates:  Sinus rhythm with rate mostly in 90-100s with rate PVCs  Relevant CV Studies: Cath 09/2012 Coronary angiography: Coronary dominance: right  Left mainstem: The left main tapers to 20% distally.  Left anterior descending (LAD): The left anterior descending artery is occluded proximally.  There is a large ramus intermediate branch which has a 70-80% stenosis at the ostium.  Left circumflex (LCx): The left circumflex has mild disease proximally up to 30%.   Right coronary artery (RCA): The right coronary is a large dominant vessel. It has a long segmental area of disease in the proximal to mid vessel up to 40%. The right coronary gives good collaterals to the LAD.  Left ventriculography: Left ventricular systolic function is abnormal, there is a focal area of akinesis in the distal inferior wall and apex. There is hypokinesis of the mid to distal anterior wall. Ejection fraction is estimated at 40-45%. There is no significant mitral regurgitation   PCI Note:  Following the diagnostic procedure, the decision was made to proceed with PCI of the LAD. The time course of her occlusion is unknown but felt to be within the past 12 hours. Given her young age I felt that revascularization was appropriate. Ticagelor 180 mg was given orally.  Weight-based heparin was given for anticoagulation. Once a therapeutic ACT was achieved, a 6 [place] XB LAD 3.0 guide catheter was inserted. I was unable to cross the occlusion with a pro-water wire. A [place] XT coronary guidewire was used to cross the lesion.  The lesion was predilated with a 2.0 mm balloon.  The  lesion was then stented with a 2.5 x 38 mm Promus premier stent. There was still a small area of disease in the proximal vessel that was not covered. We used a second 2.5 by 42mm Promus per meter stent and overlapping fashion to cover this area.   Following PCI, there was 0% residual stenosis and TIMI-3 flow. Final angiography confirmed an excellent result. The patient tolerated the procedure well. There were no immediate procedural complications. A TR band was used for radial hemostasis. The patient was transferred to the post catheterization recovery area for further monitoring.  PCI Data: Vessel - LAD/Segment - proximal to mid Percent Stenosis (pre)  100% TIMI-flow 0 Stent 2.5 x 38 mm and 2.5 by 21mm Promus premier stents Percent Stenosis (post) 0% TIMI-flow (post) 3  Final Conclusions:   1. 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 Moderate disease at the ostium of the ramus intermediate branch. 2. Mild to moderate left ventricular dysfunction. 3. Successful stenting of the Roxanol to mid LAD with drug-eluting stents.   Recommendations:  Will treat initially with dual antiplatelet therapy with aspirin 81 mg daily and Brilinta. We'll need to review indication for anticoagulation.  Laboratory Data:  Chemistry Recent Labs  Lab 11/20/16 0356 11/21/16 0453 11/22/16 0242  NA 141 142 144  K 4.8 3.6 3.5  CL 113* 113* 116*  CO2 15* 16* 19*  GLUCOSE 336* 299* 199*  BUN 18 20 16   CREATININE 1.10* 0.94 0.89  CALCIUM 7.9* 8.0* 7.9*  GFRNONAA 59* >60 >60  GFRAA >60 >60 >60  ANIONGAP 13 13 9     Recent Labs  Lab 11/17/16 1103  PROT 5.2*  ALBUMIN 2.0*  AST 45*  ALT 12*  ALKPHOS 110  BILITOT 1.8*   Hematology Recent Labs  Lab  11/20/16 0356 11/21/16 0453 11/22/16 0242  WBC 13.9* 11.5* 9.2  RBC 3.49* 3.35* 3.39*  HGB 9.6* 9.3* 9.3*  HCT 29.4* 28.2* 28.8*  MCV 84.2 84.2 85.0  MCH 27.5 27.8 27.4  MCHC 32.7 33.0 32.3  RDW 15.0 14.9  15.3  PLT 365 359 234   Cardiac EnzymesNo results for input(s): TROPONINI in the last 168 hours. No results for input(s): TROPIPOC in the last 168 hours.  BNPNo results for input(s): BNP, PROBNP in the last 168 hours.  DDimer No results for input(s): DDIMER in the last 168 hours.  Lipid Panel     Component Value Date/Time   CHOL 162 11/07/2016 0722   TRIG 152 (H) 11/07/2016 0722   HDL 39 (L) 11/07/2016 0722   CHOLHDL 4.2 11/07/2016 0722   VLDL 30 11/07/2016 0722   LDLCALC 93 11/07/2016 0722   Radiology/Studies:  Ct Angio Neck W Or Wo Contrast  Result Date: 11/21/2016 CLINICAL DATA:  46 year old  female with respiratory failure, presented with pancreatitis, DKA, and altered mental status. Found have acute to subacute left MCA territory infarcts and left ICA occlusion. EXAM: CT ANGIOGRAPHY NECK TECHNIQUE: Multidetector CT imaging of the neck was performed using the standard protocol during bolus administration of intravenous contrast. Multiplanar CT image reconstructions and MIPs were obtained to evaluate the vascular anatomy. Carotid stenosis measurements (when applicable) are obtained utilizing NASCET criteria, using the distal internal carotid diameter as the denominator. CONTRAST:  50 mL Isovue 370 COMPARISON:  Brain MRI and intracranial MRA 11/18/2016. FINDINGS: Skeleton: Absent dentition. No acute osseous abnormality identified. Visualized paranasal sinuses and mastoids are stable and well pneumatized. Upper chest: Moderate to large bilateral layering pleural effusions. Evidence of compressive atelectasis, but also non dependent distal left upper lobe peribronchial opacity. Evidence of reactive mediastinal lymph nodes. Enteric tube courses into the thoracic esophagus. Other neck: Right nasoenteric tube in place. Otherwise negative. No cervical lymphadenopathy. Aortic arch: 3 vessel arch configuration. No significant arch atherosclerosis. Right carotid system: Mild soft plaque in the right CCA  without stenosis. Mild soft and calcified plaque at the right ICA origin and bulb without stenosis. Left carotid system: Patent left CCA with only mild soft plaque. Patent left carotid bifurcation, but the left ICA occludes at the level of the bulb with a flame shaped termination (series 12, image 28). No reconstituted flow to the skullbase. Vertebral arteries:No proximal subclavian artery stenosis despite mild soft plaque. Both vertebral artery origins are normal. The left vertebral artery is dominant and patent to the skullbase without stenosis. The right vertebral artery is non dominant and diminutive but patent to the skullbase. Visible Intracranial: Posterior Circulation: The non dominant distal right vertebral artery functionally terminates in PICA. Dominant distal left vertebral artery with normal PICA origin. Left V4 segment calcified plaque with only mild stenosis. Patent vertebrobasilar junction. Visible basilar artery is patent without stenosis. Anterior circulation: The proximal left ICA siphon is appears occluded there is faint, thread-like reconstituted flow at the level cavernous left ICA (series 7, image 4). At the same time the right ICA siphon is circumferentially narrow did at the level of the proximal cavernous segment as seen on series 7, image 5. Right ICA siphon stenosis was also visible on the comparison MRA. Furthermore, the MRA revealed a prominent right posterior communicating artery above which the right ICA terminus had a more normal caliber. Review of the MIP images confirms the above findings IMPRESSION: 1. Occluded Left ICA in the neck at the level of the bulb.  Flame shaped termination of the vessel at the bulb such that differential considerations for occlusion include a left ICA dissection (however, see also #2). Faint, thread-like reconstitution only at the visible left cavernous ICA. 2. Patent cervical right ICA with only mild atherosclerosis in the neck, however, moderate to  severe circumferential tapering of the vessel - apparently due to soft plaque - is noted in the visible right ICA siphon. And the recent MRA demonstrated similar right ICA siphon stenosis with collateral supply to the right ICA terminus from the Right Pcomm. Therefore, severe soft atherosclerotic plaque might be the more likely explanation for the left ICA occlusion in #1. 3. Moderate to large bilateral layering pleural effusions. 4. Superimposed patchy peripheral left upper lobe opacity. Consider developing acute left lung infection. 5. Normal vertebral arteries, the left is dominant. Electronically Signed   By: Genevie Ann M.D.   On: 11/21/2016 10:36   Mr Virgel Paling QI Contrast  Result Date: 11/18/2016 CLINICAL DATA:  46 y/o  F; a mobile right arm. EXAM: MRI HEAD WITHOUT AND WITH CONTRAST MRA HEAD WITHOUT CONTRAST TECHNIQUE: Multiplanar, multiecho pulse sequences of the brain and surrounding structures were obtained without and with intravenous contrast. Angiographic images of the head were obtained using MRA technique without contrast. CONTRAST:  64mL MULTIHANCE GADOBENATE DIMEGLUMINE 529 MG/ML IV SOLN COMPARISON:  11/17/2016 CT of the head. 04/11/2016 MRI and MRA of the head. FINDINGS: MRI HEAD FINDINGS Brain: Large area of reduced diffusion involving the left lateral frontal lobe, second large area of reduced diffusion involving the left posterior temporal, parietal, and occipital lobes. Small foci of reduced diffusion are present within left caudate head, putamen, and scattered within the left anterolateral frontal and temporal lobes. The areas of reduced diffusion demonstrate T2 FLAIR hyperintensity and mild local mass effect. Findings likely represent late acute to early subacute infarctions. There is incomplete mild enhancement of the infarctions. No abnormal susceptibility hypointensity to indicate acute intracranial hemorrhage. Small chronic cortical infarctions are present in the right superomedial frontal  lobe, right lateral parietal lobe, right inferior cerebellar hemisphere, and the right occipital lobe. There is minimal hemosiderin deposition within the right superomedial frontal lobe chronic infarction. Mass effect from infarcts within the left hemisphere results in partial effacement of the left lateral ventricle and 3 mm of left-to-right midline shift. No herniation. Vascular: Loss of flow void within the left internal carotid artery compatible with occlusion. Skull and upper cervical spine: Normal marrow signal. Sinuses/Orbits: Negative. Other: None. MRA HEAD FINDINGS Motion degraded study. Suboptimal evaluation for subtle stenosis or small aneurysm. Internal carotid arteries: Patent right intracranial ICA. No flow related signal within the left ICA upper cervical, petrous, and cavernous segments. Faint flow is present within the left ICA at the ophthalmic and paraclinoid segments with reconstitution of flow with the terminus. Anterior cerebral arteries: Patent. Large right A1, diminutive left A1, and large anterior communicating artery, normal variant, stable. Middle cerebral arteries: Patent right middle cerebral artery and distal circulation. Patent left M1 with diminished flow related signal in the left MCA distribution. Anterior communicating artery: Patent. Posterior communicating arteries: Large right posterior communicating artery. Small left posterior communicating artery partially obscured by motion artifact. Posterior cerebral arteries:  Patent. Basilar artery:  Patent. Vertebral arteries:  Patent.  Left dominant IMPRESSION: 1. Large areas of late acute/early subacute infarction within the left MCA distribution. No associated hemorrhage. Mild mass effect with partial effacement of left lateral ventricle and 3 mm left-to-right midline shift. 2. Left internal carotid artery  occlusion with partial reconstitution of flow the level of the terminus. Asymmetric diminished flow related signal in left MCA  distribution in comparison with right. 3. Multiple chronic infarctions as above. These results will be called to the ordering clinician or representative by the Radiologist Assistant, and communication documented in the PACS or zVision Dashboard. Electronically Signed   By: Kristine Garbe M.D.   On: 11/18/2016 18:46   Mr Jeri Cos GB Contrast  Result Date: 11/18/2016 CLINICAL DATA:  46 y/o  F; a mobile right arm. EXAM: MRI HEAD WITHOUT AND WITH CONTRAST MRA HEAD WITHOUT CONTRAST TECHNIQUE: Multiplanar, multiecho pulse sequences of the brain and surrounding structures were obtained without and with intravenous contrast. Angiographic images of the head were obtained using MRA technique without contrast. CONTRAST:  24mL MULTIHANCE GADOBENATE DIMEGLUMINE 529 MG/ML IV SOLN COMPARISON:  11/17/2016 CT of the head. 04/11/2016 MRI and MRA of the head. FINDINGS: MRI HEAD FINDINGS Brain: Large area of reduced diffusion involving the left lateral frontal lobe, second large area of reduced diffusion involving the left posterior temporal, parietal, and occipital lobes. Small foci of reduced diffusion are present within left caudate head, putamen, and scattered within the left anterolateral frontal and temporal lobes. The areas of reduced diffusion demonstrate T2 FLAIR hyperintensity and mild local mass effect. Findings likely represent late acute to early subacute infarctions. There is incomplete mild enhancement of the infarctions. No abnormal susceptibility hypointensity to indicate acute intracranial hemorrhage. Small chronic cortical infarctions are present in the right superomedial frontal lobe, right lateral parietal lobe, right inferior cerebellar hemisphere, and the right occipital lobe. There is minimal hemosiderin deposition within the right superomedial frontal lobe chronic infarction. Mass effect from infarcts within the left hemisphere results in partial effacement of the left lateral ventricle and 3 mm of  left-to-right midline shift. No herniation. Vascular: Loss of flow void within the left internal carotid artery compatible with occlusion. Skull and upper cervical spine: Normal marrow signal. Sinuses/Orbits: Negative. Other: None. MRA HEAD FINDINGS Motion degraded study. Suboptimal evaluation for subtle stenosis or small aneurysm. Internal carotid arteries: Patent right intracranial ICA. No flow related signal within the left ICA upper cervical, petrous, and cavernous segments. Faint flow is present within the left ICA at the ophthalmic and paraclinoid segments with reconstitution of flow with the terminus. Anterior cerebral arteries: Patent. Large right A1, diminutive left A1, and large anterior communicating artery, normal variant, stable. Middle cerebral arteries: Patent right middle cerebral artery and distal circulation. Patent left M1 with diminished flow related signal in the left MCA distribution. Anterior communicating artery: Patent. Posterior communicating arteries: Large right posterior communicating artery. Small left posterior communicating artery partially obscured by motion artifact. Posterior cerebral arteries:  Patent. Basilar artery:  Patent. Vertebral arteries:  Patent.  Left dominant IMPRESSION: 1. Large areas of late acute/early subacute infarction within the left MCA distribution. No associated hemorrhage. Mild mass effect with partial effacement of left lateral ventricle and 3 mm left-to-right midline shift. 2. Left internal carotid artery occlusion with partial reconstitution of flow the level of the terminus. Asymmetric diminished flow related signal in left MCA distribution in comparison with right. 3. Multiple chronic infarctions as above. These results will be called to the ordering clinician or representative by the Radiologist Assistant, and communication documented in the PACS or zVision Dashboard. Electronically Signed   By: Kristine Garbe M.D.   On: 11/18/2016 18:46    Dg Chest Port 1 View  Result Date: 11/22/2016 CLINICAL DATA:  Respiratory failure. EXAM:  PORTABLE CHEST 1 VIEW COMPARISON:  Radiograph of November 21, 2016. FINDINGS: The heart size and mediastinal contours are within normal limits. No pneumothorax is noted. Feeding tube is seen entering stomach. Bibasilar opacities are noted concerning for atelectasis or edema with mild associated pleural effusions. The visualized skeletal structures are unremarkable. IMPRESSION: Stable bibasilar atelectasis or edema with mild associated pleural effusions. Electronically Signed   By: Marijo Conception, M.D.   On: 11/22/2016 07:46   Dg Chest Port 1 View  Result Date: 11/21/2016 CLINICAL DATA:  46 year old female with respiratory failure, presented with pancreatitis, DKA, and altered mental status. Found have acute to subacute left MCA territory infarcts. EXAM: PORTABLE CHEST 1 VIEW COMPARISON:  11/19/2016 and earlier. FINDINGS: Portable AP semi upright view at 0447 hours. Extubated. Enteric feeding tube in place, tip not included. Increased mild veiling opacity at both lung bases. Stable cardiac size and mediastinal contours. Stable pulmonary vascularity without edema. No pneumothorax. No consolidation. Coronary artery stent again noted. Small volume barium at the splenic flexure (s/p modified barium swallow yesterday). IMPRESSION: 1. Extubated.  Feeding tube in place. 2. Increased veiling opacity at both lung bases suggesting small pleural effusions and/or atelectasis. Electronically Signed   By: Genevie Ann M.D.   On: 11/21/2016 07:39   Dg Chest Port 1 View  Result Date: 11/19/2016 CLINICAL DATA:  Intubation. EXAM: PORTABLE CHEST 1 VIEW COMPARISON:  11/18/2016 . FINDINGS: Endotracheal tube and NG tube in stable position. Heart size normal. No focal infiltrate. No pleural effusion or pneumothorax. IMPRESSION: 1. Lines and tubes in stable position. 2. No acute cardiopulmonary disease . Chest is stable from prior exam.  Electronically Signed   By: Marcello Moores  Register   On: 11/19/2016 07:38   Dg Chest Port 1 View  Result Date: 11/18/2016 CLINICAL DATA:  Intubated patient, acute respiratory failure, diabetes, current smoker. EXAM: PORTABLE CHEST 1 VIEW COMPARISON:  Portable chest x-ray of November 17, 2016 FINDINGS: An endotracheal tube is been placed whose tip projects 3.4 cm above the carina. The esophagogastric tube tip and proximal port project below the GE junction. The lungs are well-expanded. There is no focal infiltrate. There is no pleural effusion or pneumothorax. The heart and pulmonary vascularity are normal. The bony thorax is unremarkable. IMPRESSION: Interval intubation of the trachea and esophagus with reasonable positioning of the tubes. Clear lungs. No CHF. Electronically Signed   By: David  [place]ique M.D.   On: 11/18/2016 15:06   Dg Swallowing Func-speech Pathology  Result Date: 11/20/2016 Objective Swallowing Evaluation: Type of Study: MBS-Modified Barium Swallow Study Patient Details Name: SURIYAH VERGARA MRN: [id number] Date of Birth: Jun 10, 1970 Today's Date: 11/20/2016 Time: SLP Start Time (ACUTE ONLY): 1118-SLP Stop Time (ACUTE ONLY): 1133 SLP Time Calculation (min) (ACUTE ONLY): 15 min Past Medical History: Past Medical History: Diagnosis Date . Anxiety  . Arterial thromboembolism ([place])   Prior embolectomy, previously on Coumadin . Coronary atherosclerosis of native coronary artery   DES to LAD September 2014 . Depression  . Facial cellulitis   12/2010 . Glomerulonephritis  . Headache(784.0)  . History of stroke  . Hyperlipidemia  . Insulin dependent diabetes mellitus ([place])   History of diabetic ketoacidosis . Ischemic cardiomyopathy   LVEF 40-45% . Peripheral arterial disease ([place])  . Shingles   11/2010 . ST elevation myocardial infarction (STEMI) of anterior wall [facility])   Late presentation September 2014 Past Surgical History: Past Surgical History: Procedure Laterality Date . AMPUTATION  03/03/2011  Procedure:  AMPUTATION DIGIT;  Surgeon: Beverely Low  Fernanda Drum, MD;  Location: [place];  Service: Orthopedics;  Laterality: Left;  Left Foot Amputation 4th and 5th toes at MTP joint . AMPUTATION  04/22/2011  Procedure: AMPUTATION BELOW KNEE;  Surgeon: Newt Minion, MD;  Location: [place];  Service: Orthopedics;  Laterality: Left;  Left Below Knee Amputation . DILATION AND CURETTAGE OF UTERUS   . EMBOLECTOMY  01/29/2011  Procedure: EMBOLECTOMY;  Surgeon: Mal Misty, MD;  Location: [facility] OR;  Service: Vascular;  Laterality: Left;  Left Popliteal and Tibial Embolectomy with patch angioplasty . INCISION AND DRAINAGE ABSCESS N/A 04/06/2016  Procedure: INCISION AND DRAINAGE ABSCESS;  Surgeon: Jonnie Kind, MD;  Location: [facility];  Service: Gynecology;  Laterality: N/A; . LEFT HEART CATHETERIZATION WITH CORONARY ANGIOGRAM N/A 10/01/2012  Procedure: LEFT HEART CATHETERIZATION WITH CORONARY ANGIOGRAM;  Surgeon: Peter M Martinique, MD;  Location: [facility] CATH LAB;  Service: Cardiovascular;  Laterality: N/A; . LOWER EXTREMITY ANGIOGRAM N/A 01/27/2011  Procedure: LOWER EXTREMITY ANGIOGRAM;  Surgeon: Rosetta Posner, MD;  Location: [facility] CATH LAB;  Service: Cardiovascular;  Laterality: N/A; . LOWER EXTREMITY ANGIOGRAM N/A 01/28/2011  Procedure: LOWER EXTREMITY ANGIOGRAM;  Surgeon: Conrad Campbell, MD;  Location: [facility] CATH LAB;  Service: Cardiovascular;  Laterality: N/A; . LOWER EXTREMITY ANGIOGRAM Left 01/29/2011  Procedure: LOWER EXTREMITY ANGIOGRAM;  Surgeon: Mal Misty, MD;  Location: [facility] CATH LAB;  Service: Cardiovascular;  Laterality: Left; Marland Kitchen MULTIPLE TOOTH EXTRACTIONS   . TEE WITHOUT CARDIOVERSION  04/15/2011  Procedure: TRANSESOPHAGEAL ECHOCARDIOGRAM (TEE);  Surgeon: [name], MD;  [facility];  Service: Cardiovascular;  Laterality: N/A; . WRIST SURGERY    Left HPI: Pt is a 46 yo female at [facility] from 10/20 to 10/27 with acute pancreatitis and delirium. No clear cause of her pancreatitis was found. After discharge home, the family reports that Lisa.  Toruno was not compliant with her diabetes regimen. She didn't have a means to test her blood sugars, and would sporadically use her insulin. She became progressively more short of breath and nauseous over the past 2 days. She was also getting dry heaves. She started getting more confused. She was brought to ER and found to have severe metabolic acidosis with hyperglycemia consistent with DKA. MRI of head reveals Large areas of late acute/early subacute infarction within the left MCA distribution as well as multiple chronic infarcts. Intubated 11/1 to 11/2. Chest x ray without acute cardiopulmonary disease.  Subjective: Pt nods head when questioned if she understands SLP Assessment / Plan / Recommendation CHL IP CLINICAL IMPRESSIONS 11/20/2016 Clinical Impression Patient presents with suspected moderate oropharyngeal dysphagia with sensory and motor impairments secondary to neurologic deficits and recent, brief intubation. This objective study was very limited as pt began gagging when 3rd bolus was administered. Pt grew very anxious, moaning in pain and was unable to continue the study. Despite limited observation, noted oral weakness resulting in decreased bolus cohesion, piecemeal swallowing. Tongue base retraction, hyolaryngeal excursion and pharyngeal constriction reduced, resulting in mild-moderate pharyngeal residue. Swallow initiation delayed with puree, with reversible penetration during the swallow. Pt coughing between frames; no observed aspiration but ? reduced airway protection with secretions, possibly diluted residue. As teaspoon bolus of honey was administered, pt began gagging, with backflow of vallecular puree residue into the oral cavity, which pt expectorated along with honey-thick bolus. Pt began crying, moaning, HR elevated. SLP provided encouragement, relaxation techniques. Pt calmed slightly but she continued moaning and was unable to continue the exam. Given her clinical signs of  aspiration at bedside and observed impairments in this brief exam, she is at moderate-severe risk for aspiration. Recommend she remain NPO until she is able to participate in repeat instrumental exam; will follow to determine readiness for repeat MBS or possible FEES Monday. Overall prognosis good, though she will likely need temporary means of alternative nutrition. SLP Visit Diagnosis Dysphagia, oropharyngeal phase (R13.12) Attention and concentration deficit following -- Frontal lobe and executive function deficit following -- Impact on safety and function Moderate aspiration risk;Severe aspiration risk;Other (comment)   CHL IP TREATMENT RECOMMENDATION 11/20/2016 Treatment Recommendations F/U MBS in --- days (Comment);F/U FEES in --- days (Comment)   Prognosis 11/20/2016 Prognosis for Safe Diet Advancement Good Barriers to Reach Goals Language deficits;Time post onset;Severity of deficits Barriers/Prognosis Comment -- CHL IP DIET RECOMMENDATION 11/20/2016 SLP Diet Recommendations Alternative means - temporary;NPO Liquid Administration via -- Medication Administration Via alternative means Compensations -- Postural Changes --   CHL IP OTHER RECOMMENDATIONS 11/20/2016 Recommended Consults -- Oral Care Recommendations Oral care QID Other Recommendations --   CHL IP FOLLOW UP RECOMMENDATIONS 11/20/2016 Follow up Recommendations Inpatient Rehab   CHL IP FREQUENCY AND DURATION 11/20/2016 Speech Therapy Frequency (ACUTE ONLY) min 2x/week Treatment Duration 2 weeks      CHL IP ORAL PHASE 11/20/2016 Oral Phase Impaired Oral - Pudding Teaspoon -- Oral - Pudding Cup -- Oral - Honey Teaspoon Right anterior bolus loss;Other (Comment) Oral - Honey Cup -- Oral - Nectar Teaspoon -- Oral - Nectar Cup -- Oral - Nectar Straw -- Oral - Thin Teaspoon -- Oral - Thin Cup -- Oral - Thin Straw -- Oral - Puree Piecemeal swallowing;Delayed oral transit;Decreased bolus cohesion;Pocketing in anterior sulcus Oral - Mech Soft -- Oral - Regular --  Oral - Multi-Consistency -- Oral - Pill -- Oral Phase - Comment --  CHL IP PHARYNGEAL PHASE 11/20/2016 Pharyngeal Phase Impaired Pharyngeal- Pudding Teaspoon -- Pharyngeal -- Pharyngeal- Pudding Cup -- Pharyngeal -- Pharyngeal- Honey Teaspoon -- Pharyngeal -- Pharyngeal- Honey Cup -- Pharyngeal -- Pharyngeal- Nectar Teaspoon -- Pharyngeal -- Pharyngeal- Nectar Cup -- Pharyngeal -- Pharyngeal- Nectar Straw -- Pharyngeal -- Pharyngeal- Thin Teaspoon -- Pharyngeal -- Pharyngeal- Thin Cup -- Pharyngeal -- Pharyngeal- Thin Straw -- Pharyngeal -- Pharyngeal- Puree Reduced pharyngeal peristalsis;Reduced anterior laryngeal mobility;Reduced laryngeal elevation;Reduced tongue base retraction;Pharyngeal residue - valleculae;Penetration/Aspiration during swallow;Delayed swallow initiation-vallecula Pharyngeal Material enters airway, remains ABOVE vocal cords then ejected out Pharyngeal- Mechanical Soft -- Pharyngeal -- Pharyngeal- Regular -- Pharyngeal -- Pharyngeal- Multi-consistency -- Pharyngeal -- Pharyngeal- Pill -- Pharyngeal -- Pharyngeal Comment --  No flowsheet data found. No flowsheet data found. Aliene Altes 11/20/2016, 3:20 PM  Deneise Lever, Lisa, [place] Speech-Language Pathologist [phone]             Assessment and Plan:   1. Acute systolic CHF with prior hx of ICM - Echo this admission showed LVEF of 30-35% (down from 2016) with Akinesis of the distal septum and apex. mild diastolic dysfunction; mild LVH;  prominent apical trabeculae but no clear thrombus; mild MR.  - She has hx of ICM with normalization of LVEF on last echo in 2016. - Consider BB and ACE/ARB (allow permissive hypertension). Volume statin relatively stable. Non a current candidate of invasive testing. Likely outpatient stress test when more stable.   2. CAD s/p DES to LAD in 2014 - Continue ASA  3. 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 Previously on coumadin. Neurology is recommended Eliquis (start date  11/25/16)  4. DM -  A1c 14.3  5. HTN - Relatively stable  6. AKI - Resolved  7. Tobacco abuse - She need to quit.  For questions or updates, please contact [place] Please consult [email or web address] for contact info under Cardiology/STEMI.   Jarrett Soho, PA  11/22/2016 3:03 PM    Patient seen and examined. Agree with assessment and plan.' [name] is an unfortunate 46 year old female has history of type 1 diabetes mellitus and in 2014 suffered an ST segment elevation MI and was found to have total occlusion of her LAD which was successfully treated with stenting.  She has significant peripheral vascular disease and is status post left BKA.  Initially, daily.  Ejection fraction was 40-45%, which subsequently improved to 6065%.  She recently was found to have severe metabolic acidosis with DKA and presented with a left ICA occlusion.  An echo during this admission is shown an EF down to 30-35% with wall motion abnormality in the distal septum and apex consistent with prior MI, mild diastolic dysfunction, LVH, and no definitive thrombus was identified.  She has residual right hemiparesis.  Presently, she is aphasic but is not having chest pain or shortness of breath.  A CT has shown bilateral pleural effusions. ,  Her blood pressure is on the low side of normal at 105/70 and she is in sinus rhythm with a pulse in the 90s to 100 range.  Will try to initiate very low-dose carvedilol at 3.125 mg twice a day as blood pressure and pulse rate allow.  There is a reported allergy to losartan and will need to investigate further.  Lipid studies reveal an LDL of 93 and she is now on atorvastatin 80 mg.  Renal function remained stable and as her blood pressure will allow, as long as she has not had anaphylaxis to losartan, it may be possible to initiate low-dose ACE or another ARB with her LV dysfunction.  Future plan is to initiate eliquis for systemic anticoagulation in several days.  We  will follow patient with you.   Troy Sine, MD, [facility] 11/22/2016 5:36 PM

## 2016-11-22 NOTE — Progress Notes (Signed)
PULMONARY / CRITICAL CARE MEDICINE   Name: ANALEIGH ARIES MRN: [id number] DOB: January 08, 1971    ADMISSION DATE:  11/17/2016  REFERRING MD:  Dr. Venora Maples, ER  CHIEF COMPLAINT:  Altered mental status  HISTORY OF PRESENT ILLNESS:   Hx from ER staff, chart, and family.  46 yo female at [facility] from 10/20 to 10/27 with acute pancreatitis and delirium.  No clear cause of her pancreatitis was found.  After discharge home, the family reports that Ms. Mayhall was not compliant with her diabetes regimen.  She didn't have a means to test her blood sugars, and would sporadically use her insulin.  She became progressively more short of breath and nauseous over the past 2 days.  She was also getting dry heaves.  She started getting more confused.  She was brought to ER and found to have severe metabolic acidosis with hyperglycemia consistent with DKA.  SUBJECTIVE:   Patient tearful, asking for help, somewhat nonpurposeful.   VITAL SIGNS: BP 103/78   Pulse (!) 102   Temp 99.1 F (37.3 C)   Resp 17   Ht _0  (1.651 m)   Wt 112 lb 3.4 oz (50.9 kg)   LMP 01/12/2011   SpO2 96%   BMI 18.67 kg/m   INTAKE / OUTPUT: I/O last 3 completed shifts: In: 6778.3 [I.V.:5100; NG/GT:1578.3; IV Piggyback:100] Out: 2465 [Urine:2465]  PHYSICAL EXAMINATION: General - chronically ill appearing female, supine in bed, asleep  on 2L , sat of 100% Eyes - PERRLA ENT - dry mucosa, no LAD, Coretrack in place Cardiac - S1, S2, RRR, No RMG Chest - no wheeze, rales, + coarse,  No accessory muscle use Abd - soft, non tender, BS + Ext - 1+ edema RU extremity,  Lt BKA. Fine tremor of RUE.  Skin - warm, dry, and intact no rashes or lesions Neuro - opens eyes with stimulation,  following commands intermittently, + RUE weakness, moving R leg, global aphasia  LABS:  BMET Recent Labs  Lab 11/20/16 0356 11/21/16 0453 11/22/16 0242  NA 141 142 144  K 4.8 3.6 3.5  CL 113* 113* 116*  CO2 15* 16* 19*  BUN _1 CREATININE 1.10* 0.94 0.89  GLUCOSE 336* 299* 199*   Electrolytes Recent Labs  Lab 11/19/16 0235 11/20/16 0356 11/21/16 0453 11/21/16 1938 11/22/16 0242  CALCIUM 8.0* 7.9* 8.0*  --  7.9*  MG 1.8  --  1.6* 2.0 1.8  PHOS 3.5  --  2.4*  --  1.9*   CBC Recent Labs  Lab 11/20/16 0356 11/21/16 0453 11/22/16 0242  WBC 13.9* 11.5* 9.2  HGB 9.6* 9.3* 9.3*  HCT 29.4* 28.2* 28.8*  PLT 365 359 234   Coag's No results for input(s): APTT, INR in the last 168 hours.  Sepsis Markers Recent Labs  Lab 11/17/16 1316 11/17/16 1737 11/18/16 1246 11/19/16 0235 11/20/16 0356  LATICACIDVEN 2.93* 2.40*  --   --   --   PROCALCITON  --   --  0.60 0.32 <0.10   ABG Recent Labs  Lab 11/17/16 1100 11/17/16 1530 11/19/16 0440  PHART 6.965* 7.122* 7.385  PCO2ART BELOW REPORTABLE RANGE BELOW REPORTABLE RANGE 33.7  PO2ART 156* 113* 123*   Liver Enzymes Recent Labs  Lab 11/17/16 1103  AST 45*  ALT 12*  ALKPHOS 110  BILITOT 1.8*  ALBUMIN 2.0*   Cardiac Enzymes No results for input(s): TROPONINI, PROBNP in the last 168 hours.  Glucose Recent Labs  Lab 11/21/16 0826 11/21/16  1121 11/21/16 1510 11/21/16 1933 11/22/16 0038 11/22/16 0438  GLUCAP 268* 230* 179* 146* 210* 162*   Imaging Ct Angio Neck W Or Wo Contrast  Result Date: 11/21/2016 CLINICAL DATA:  46 year old  female with respiratory failure, presented with pancreatitis, DKA, and altered mental status. Found have acute to subacute left MCA territory infarcts and left ICA occlusion. EXAM: CT ANGIOGRAPHY NECK TECHNIQUE: Multidetector CT imaging of the neck was performed using the standard protocol during bolus administration of intravenous contrast. Multiplanar CT image reconstructions and MIPs were obtained to evaluate the vascular anatomy. Carotid stenosis measurements (when applicable) are obtained utilizing NASCET criteria, using the distal internal carotid diameter as the denominator. CONTRAST:  50 mL Isovue 370  COMPARISON:  Brain MRI and intracranial MRA 11/18/2016. FINDINGS: Skeleton: Absent dentition. No acute osseous abnormality identified. Visualized paranasal sinuses and mastoids are stable and well pneumatized. Upper chest: Moderate to large bilateral layering pleural effusions. Evidence of compressive atelectasis, but also non dependent distal left upper lobe peribronchial opacity. Evidence of reactive mediastinal lymph nodes. Enteric tube courses into the thoracic esophagus. Other neck: Right nasoenteric tube in place. Otherwise negative. No cervical lymphadenopathy. Aortic arch: 3 vessel arch configuration. No significant arch atherosclerosis. Right carotid system: Mild soft plaque in the right CCA without stenosis. Mild soft and calcified plaque at the right ICA origin and bulb without stenosis. Left carotid system: Patent left CCA with only mild soft plaque. Patent left carotid bifurcation, but the left ICA occludes at the level of the bulb with a flame shaped termination (series 12, image 28). No reconstituted flow to the skullbase. Vertebral arteries:No proximal subclavian artery stenosis despite mild soft plaque. Both vertebral artery origins are normal. The left vertebral artery is dominant and patent to the skullbase without stenosis. The right vertebral artery is non dominant and diminutive but patent to the skullbase. Visible Intracranial: Posterior Circulation: The non dominant distal right vertebral artery functionally terminates in PICA. Dominant distal left vertebral artery with normal PICA origin. Left V4 segment calcified plaque with only mild stenosis. Patent vertebrobasilar junction. Visible basilar artery is patent without stenosis. Anterior circulation: The proximal left ICA siphon is appears occluded there is faint, thread-like reconstituted flow at the level cavernous left ICA (series 7, image 4). At the same time the right ICA siphon is circumferentially narrow did at the level of the  proximal cavernous segment as seen on series 7, image 5. Right ICA siphon stenosis was also visible on the comparison MRA. Furthermore, the MRA revealed a prominent right posterior communicating artery above which the right ICA terminus had a more normal caliber. Review of the MIP images confirms the above findings IMPRESSION: 1. Occluded Left ICA in the neck at the level of the bulb. Flame shaped termination of the vessel at the bulb such that differential considerations for occlusion include a left ICA dissection (however, see also #2). Faint, thread-like reconstitution only at the visible left cavernous ICA. 2. Patent cervical right ICA with only mild atherosclerosis in the neck, however, moderate to severe circumferential tapering of the vessel - apparently due to soft plaque - is noted in the visible right ICA siphon. And the recent MRA demonstrated similar right ICA siphon stenosis with collateral supply to the right ICA terminus from the Right Pcomm. Therefore, severe soft atherosclerotic plaque might be the more likely explanation for the left ICA occlusion in #1. 3. Moderate to large bilateral layering pleural effusions. 4. Superimposed patchy peripheral left upper lobe opacity. Consider developing acute left  lung infection. 5. Normal vertebral arteries, the left is dominant. Electronically Signed   By: Genevie Ann M.D.   On: 11/21/2016 10:36   STUDIES:  CT head 10/31 >> Old Rt frontal infarct, Age indeterminate infarct Lt parietal lobe, chronic microvascular disease, cerebral atrophy MRI Brain11/1>> Large areas of late acute/early subacute infarction within the left MCA distribution. No associated hemorrhage. Mild mass effect with partial effacement of left lateral ventricle and 3 mm left-to-right midline shift. Left internal carotid artery occlusion with partial reconstitution of flow the level of the terminus. Asymmetric diminished flow related signal in left MCA distribution in comparison with  right. EEG>> EEG is abnormal due to the presence of moderate diffuse background slowing. Findings indicate diffuse cerebral dysfunction that is non-specific in etiology and can be seen with hypoxic/ischemic injury, toxic/metabolic encephalopathies, or medication effect.  The absence of epileptiform discharges does not rule out a clinical diagnosis of epilepsy.   CTA Neck with contrast 11/4 >>Occluded Left ICA in the neck at the level of the bulb. Flame shaped termination of the vessel at the bulb such that differential considerations for occlusion include a left ICA dissection (however, see also #2). Faint, thread-like reconstitution only at the visible left cavernous ICA. Patent cervical right ICA with only mild atherosclerosis in the neck, however, moderate to severe circumferential tapering of the vessel -  due to soft plaque - is noted in the visible right ICA siphon. And the recent MRA demonstrated similar right ICA siphon stenosis with collateral supply to the right ICA terminus from the Right Pcomm. Therefore, severe soft atherosclerotic plaque might be the more likely explanation for the left ICA occlusion. Moderate to large bilateral layering pleural effusions. Superimposed patchy peripheral left upper lobe opacity. Consider developing acute left lung infection. Echo: newly reduced EF to 30-35%, hypokinesis of anteroseptum and apex. G1DD. Mild MR.   SIGNIFICANT EVENTS: 10/31 Admit 11/1 BG improving, notable RUE weakness and tremor, plan for MRI brain looking for CVA vs structural causes for tremor >> large MCA infarct, MRA with left ICA occlusion with partial reconstitution.  11/2 >> Extubated  DISCUSSION: 46 yo female with DKA with coma likely related to medical non compliance, with imaging consistent with Left MCA large stroke likely secondary to large vessel disease with left ICA occlusion. Etiology likely due to known hx of thrombophilia. 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  ASSESSMENT / PLAN: Neuro: Large MCA infarct: given RUE weakness and AMS, Expressive aphasia, suspected new CVA based on CT head from ED. MRI confirmed large MCA infarct. Global aphasia. RUE hemiplegia.  - stroke team following, appreciate recs - PT/OT/SLP>> Swallow study pending 11/3>> Chortrak placed - hold outpt xanax, percocet, neurontin - monitor for opioid withdrawal>> prn ativan - EEG results as above - Resume Eliquis 11/11 (per neuro note 11/4)  Cardio:  Hx of CAD s/p stenting. Hx of thromboembolism, HLD, PAD. HTN. Echo 11/4 with EF 30-35%, hypokinesis of anteroseptum, apex, G1DD, no clear thrombus. Repeat CXR more consistent with atelectasis, no additional diuresis 11/5.  - Telemetry monitoring - Maintain MAP > 65 - out of permissive HTN window - hold outpt eliquis for now>> resume 11/11 per neuro - Continue Statin - consult HF team for newly reduced EF   Pulmonary:  Moderate to large bilateral layering pleural effusions per CT 11/4>>. Superimposed patchy peripheral left upper lobe opacity. ? developing acute left lung infection. Last lasix 11/4 34m IV.  Net + 14 L - Mobilize/OOB -  Agressive Pulmonary Toilet as able  - Follow CXR - Titrate oxygen for saturations >92%  Infectious: Leukocytosis, T Max 100.6, AF x 24 hours. Urine culture with 100,000 Lactobacillus. Bcx NGTD  - Follow CXR - Trend fever curve/ WBC - follow blood cultures  Endocrine:  DM type 1: Poor compliance with insulin regimen. On with hypoglycemia protocol.  HGB A1c 14.3 - Lantus 10 units daily - CBG Q 4 - sensitive SSI  - Case Management Consult to ensure patient has supplies to manage DM once discharged>> with stroke and poor medication compliance>> will most likely need long term  placement   Renal:  Acute renal failure with ATN >> baseline creatinine 0.72 from 11/08/16. Continues to improve.  - Trend BMET - Avoid nephrotoxic drugs - Monitor UO - replete electrolytes  as needed  Heme:  Anemia of critical illness and chronic disease. - f/u CBC - Monitor for obvious source of bleeding - Transfuse for HGB < 7  Hypokalemia in setting of DKA>> resolved.  Hypomag , Hypo phos 11/4  - monitor heart rhythm - f/u BMET 11/4 - replete lytes as needed  DVT prophylaxis - SQ heparin SUP - not indicated Diet - TF Goals of care - full code - Palliative care met with family.  Ralene Ok, MD PGY2, Family Medicine

## 2016-11-22 NOTE — Progress Notes (Signed)
  Speech Language Pathology Treatment: Dysphagia  Patient Details Name: [name] MRN: [id number] DOB: 02-17-1970 Today's Date: 11/22/2016 Time: 0153-0205 SLP Time Calculation (min) (ACUTE ONLY): 12 min  Assessment / Plan / Recommendation Clinical Impression  Pt observed with PO trials to determine readiness for repeat objective swallow study. Pt tolerated trials of honey-thick liquid and puree consistencies with no gagging/refusal of POs and subjectively improved oral cohesion/control since MBS on 11/20/16. Wet throat clearing observed following first few bites of puree likely secondary to vallecular residue as noted in MBS report; given additional bites of puree and sips of honey-thick liquids, throat clearing ceased. Pt demonstrated improved tolerance and cooperation suggesting readiness for repeat objective swallow study; will f/u with FEES tomorrow. Pt to remain NPO with potential for diet advancement pending FEES results; puree textures from stock (applesauce/pudding) allowed by RN.     HPI HPI: Pt is a 46 yo female at [facility] from 10/20 to 10/27 with acute pancreatitis and delirium. No clear cause of her pancreatitis was found. After discharge home, the family reports that [name] was not compliant with her diabetes regimen. She didn't have a means to test her blood sugars, and would sporadically use her insulin. She became progressively more short of breath and nauseous over the past 2 days. She was also getting dry heaves. She started getting more confused. She was brought to ER and found to have severe metabolic acidosis with hyperglycemia consistent with DKA. MRI of head reveals Large areas of late acute/early subacute infarction within the left MCA distribution as well as multiple chronic infarcts. Intubated 11/1 to 11/2. Chest x ray without acute cardiopulmonary disease.       SLP Plan  New goals to be determined pending instrumental study       Recommendations  Diet  recommendations: NPO(Stock puree allowed) Medication Administration: Via alternative means Postural Changes and/or Swallow Maneuvers: Seated upright 90 degrees                Oral Care Recommendations: Oral care QID Follow up Recommendations: Inpatient Rehab SLP Visit Diagnosis: Dysphagia, oropharyngeal phase (R13.12) Plan: New goals to be determined pending instrumental study       At[place], Student SLP 11/22/2016, 2:25 PM

## 2016-11-22 NOTE — Progress Notes (Signed)
Patient arrived around 2200 from 26M her speech is unintelligible, does not follow commands but seems drowsy, has foley and core track with Glucerna running at 60 with 20 ml of water every 4 hours, she was saying ouch  Which nurse giving report to me meant that she was having pain. I gave her 25 mcg's of fentanyl will continue to monitor.

## 2016-11-22 NOTE — Progress Notes (Signed)
STROKE TEAM PROGRESS NOTE   SUBJECTIVE (INTERVAL HISTORY) Father and step mother are at the bedside. As per them, pt lives with her daughter. Pt likely not taking medications or taking care of her self well at home. She is awake but still aphasic, not able to move right UE, but spontaneous on the right LE. CTA neck confirmed left ICA occlusion. On cortrak and TF. BP stable.   OBJECTIVE Temp:  [98.6 F (37 C)-100 F (37.8 C)] 99.9 F (37.7 C) (11/05 1000) Pulse Rate:  [85-119] 102 (11/05 1000) Cardiac Rhythm: Sinus tachycardia (11/05 0800) Resp:  [11-26] 18 (11/05 1000) BP: (98-184)/(59-111) 108/69 (11/05 1000) SpO2:  [95 %-100 %] 96 % (11/05 1000) Weight:  [112 lb 3.4 oz (50.9 kg)] 112 lb 3.4 oz (50.9 kg) (11/05 0450)  CBC:  Recent Labs  Lab 11/21/16 0453 11/22/16 0242  WBC 11.5* 9.2  HGB 9.3* 9.3*  HCT 28.2* 28.8*  MCV 84.2 85.0  PLT 359 465    Basic Metabolic Panel:  Recent Labs  Lab 11/21/16 0453 11/21/16 1938 11/22/16 0242  NA 142  --  144  K 3.6  --  3.5  CL 113*  --  116*  CO2 16*  --  19*  GLUCOSE 299*  --  199*  BUN 20  --  16  CREATININE 0.94  --  0.89  CALCIUM 8.0*  --  7.9*  MG 1.6* 2.0 1.8  PHOS 2.4*  --  1.9*    Lipid Panel:     Component Value Date/Time   CHOL 162 11/07/2016 0722   TRIG 152 (H) 11/07/2016 0722   HDL 39 (L) 11/07/2016 0722   CHOLHDL 4.2 11/07/2016 0722   VLDL 30 11/07/2016 0722   LDLCALC 93 11/07/2016 0722   HgbA1c:  Lab Results  Component Value Date   HGBA1C 13.6 (H) 11/21/2016   Urine Drug Screen:     Component Value Date/Time   LABOPIA NONE DETECTED 09/30/2012 1959   COCAINSCRNUR NONE DETECTED 09/30/2012 1959   LABBENZ POSITIVE (A) 09/30/2012 1959   AMPHETMU NONE DETECTED 09/30/2012 1959   THCU NONE DETECTED 09/30/2012 1959   LABBARB NONE DETECTED 09/30/2012 1959    Alcohol Level     Component Value Date/Time   ETH <10 11/17/2016 1103    IMAGING I have personally reviewed the radiological images below and  agree with the radiology interpretations.  CT head Old right frontal infarct. New low-density scratched at low-density area in the left parietal lobe compatible with infarct, age indeterminate, new since 2014. Atrophy, chronic microvascular disease.  Mr [name] Head Wo Contrast 11/18/2016 IMPRESSION:  1. Large areas of late acute/early subacute infarction within the left MCA distribution. No associated hemorrhage. Mild mass effect with partial effacement of left lateral ventricle and 3 mm left-to-right midline shift.  2. Left internal carotid artery occlusion with partial reconstitution of flow the level of the terminus. Asymmetric diminished flow related signal in left MCA distribution in comparison with right.  3. Multiple chronic infarctions as above.   Transthoracic Echocardiogram -  - Akinesis of the distal septum and apex with overall moderate to   severe LV dysfunction; mild diastolic dysfunction; mild LVH;   prominent apical trabeculae but no clear thrombus; mild MR.  Bilateral Carotid Dopplers - Occluded left ICA was seen, no abnormalities detected on the right Carotid artery.  CTA neck 1. Occluded Left ICA in the neck at the level of the bulb. Flame shaped termination of the vessel at the bulb such  that differential considerations for occlusion include a left ICA dissection (however, see also #2). Faint, thread-like reconstitution only at the visible left cavernous ICA. 2. Patent cervical right ICA with only mild atherosclerosis in the neck, however, moderate to severe circumferential tapering of the vessel - apparently due to soft plaque - is noted in the visible right ICA siphon. And the recent MRA demonstrated similar right ICA siphon stenosis with collateral supply to the right ICA terminus from the Right Pcomm. Therefore, severe soft atherosclerotic plaque might be the more likely explanation for the left ICA occlusion in #1. 3. Moderate to large bilateral layering  pleural effusions. 4. Superimposed patchy peripheral left upper lobe opacity. Consider developing acute left lung infection. 5. Normal vertebral arteries, the left is dominant.   PHYSICAL EXAM Vitals:   11/22/16 0800 11/22/16 0845 11/22/16 0900 11/22/16 1000  BP: 111/73   108/69  Pulse: (!) 108  97 (!) 102  Resp: (!) 26  20 18   Temp:  99.9 F (37.7 C)  99.9 F (37.7 C)  TempSrc:  Core (Comment)    SpO2: 96%  96% 96%  Weight:      Height:        Temp:  [98.6 F (37 C)-100 F (37.8 C)] 99.9 F (37.7 C) (11/05 1000) Pulse Rate:  [85-119] 102 (11/05 1000) Resp:  [11-26] 18 (11/05 1000) BP: (98-184)/(59-111) 108/69 (11/05 1000) SpO2:  [95 %-100 %] 96 % (11/05 1000) Weight:  [112 lb 3.4 oz (50.9 kg)] 112 lb 3.4 oz (50.9 kg) (11/05 0450)  General - thin, well developed, not in acute distress  Ophthalmologic - Fundi not visualized due to noncooperation.  Cardiovascular - Regular rate and rhythm.  Neuro - awake alert but global aphasia, not following commands except open and close eyes, no speech output except monotone sound. Appears to have some right side neglect. Blinking to visual threat on the left but not consistently on the right. PERRL, left gaze preference but able to attending to both sides. Mild left facial weakness, tongue midline in mouth. RUE and RLE spontaneous movement with RLE BKA. LUE 0/5 and no movement on pain, LLE brisk movement on pain and with spontaneous movement against gravity. Sensation, coordination not cooperative and gait not tested.  ASSESSMENT/PLAN [name] is a 46 y.o. female with history of previous strokes, diabetes mellitus, tobacco use, coronary artery disease with previous MI, peripheral arterial disease, ischemic cardiomyopathy, hyperlipidemia, medical noncompliance, anxiety, depression, and a history of pancreatitis presenting with altered mental status. She did not receive IV t-PA due to unknown time of onset.  Stroke:  Left MCA  large stroke likely secondary to large vessel disease with left ICA occlusion. Etiology likely due to known hx of thrombophilia.   Resultant global aphasia, right UE plegia  CT head -acute left MCA infarct, old right MCA, ACA infarcts  MRI head - Large areas of late acute/early subacute infarction within the left MCA distribution.   Partial effacement of left lateral ventricle and 3 mm left-to-right midline shift.   MRA head - Lt ICA occlusion with partial reconstitution. Diminished flow in left MCA distribution.  CTA neck left ICA occlusion, right ICA siphon soft plaque with stenosis  Carotid Doppler - left ICA occlusion  2D Echo - EF 30-35%, severe LV dysfunction  LDL - 93  HgbA1c - 14.3  VTE prophylaxis - Subcutaneous heparin Diet NPO time specified  No antithrombotic prior to admission, now on aspirin 325 mg daily. 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  08/2991 - left leg ischemic change, embolic events, s/p left LE thrombectomy -put on Coumadin  03/2011 - multifocal infarcts involving right MCA/ACA, right MCA, right cerebellum, embolic pattern, EEG negative for seizure, TEE and TTE negative - hypercoagulable workup showed protein C and protein S deficiency and positive lupus anticoagulant testing, negative for anticardiolipin and anti-beta-2 glycoprotein antibodies - continue the Coumadin and add Plavix   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, continue Coumadin, Plavix discontinued  Patient was on Eliquis PTA - likely noncompliant  Continue ASA for now - resume eliquis in one week post stroke if no procedure planned (11/25/16)  Left ICA occlusion, 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 and noncompliant with  anticoagulation  MRA nonvisualization of left ICA  Carotid Doppler left ICA occlusion  CTA of the neck pending - pattern consistent with embolic event  Continue ASA for now - resume eliquis in one week post stroke if no procedure planned (11/25/16)  Cardiomyopathy  TTE EF 30-35%  In 2016 EF was 60-65%  Cardiology on board  Likely ischemic etiology given vasculopathy  Uncontrolled diabetes  HgbA1c 14.3, goal < 7.0  Uncontrolled  11/06/16 - 11/13/16 admitted to [facility] for DKA  History of left foot infection with progression ended up with left BKA  SSI and CBG monitoring  On Lantus  Management per primary team  Hypertension  Stable Long-term BP goal 120-150 due to left ICA occlusion and right ICA stenosis  Hyperlipidemia  Home meds: No lipid lowering medications prior to admission  LDL 93, goal < 70  Lipitor 80 when taking PO meds.  Continue statin at discharge  AKI   Creatinine 1.79-> 1.47-> 1.27->0.94->0.89  Likely due to DKA and dehydration  Continue TF  UTI with leukocytosis, resolved  WBC 23.6-> 19.0->13.9->11.5->9.2  Urine culture - > 100,000 lactobacillus  On fluconazole  Tobacco abuse  Current smoker  Smoking cessation counseling will be provided  Other Stroke Risk Factors  Family hx stroke (Mother)  CAD/MI 2014 s/p stent  PAD  History of drug use  Medical noncompliance  Other Active Problems  Fever, resolved  Anemia of chronic disease - 9.6->9.3->9.3  ? Xanax withdrawal  Left BKA  Hospital day # 5  This patient is critically ill due to left MCA large stroke, DKA, left ICA occlusion, UTI with leukocytosis, AKI and thrombophilia and at significant risk of neurological worsening, death form recurrent stroke, hemorrhagic conversion, DKA, sepsis, kidney failure, respiratory failure and heart failure. This patient's care requires constant monitoring of vital signs, hemodynamics, respiratory and cardiac monitoring, review of  multiple databases, neurological assessment, discussion with family, other specialists and medical decision making of high complexity. I had long discussion with father and stepmother at bedside, updated pt current condition, treatment plan and potential prognosis. They expressed understanding and appreciation. I also discussed with Dr. Ashok Cordia in the ICU. I spent 35 minutes of neurocritical care time in the care of this patient.  Neurology will sign off. Please call with questions. Pt will follow up with Dr. Erlinda Hong at [facility] in about 6 weeks. Thanks for the consult.   Rosalin Hawking, MD PhD Stroke Neurology 11/22/2016 11:56 AM  To contact Stroke Continuity provider, please refer to [email or web address] After hours, contact General Neurology

## 2016-11-22 NOTE — Progress Notes (Addendum)
Nutrition Follow-up  DOCUMENTATION CODES:   Underweight  INTERVENTION:    To better meet re-estimated need, change TF to Glucerna 1.2 at 60 ml/h (1440 ml per day) to provide 1728 kcal 86 gm protein, 1159 ml free water daily.  Follow for adequacy of oral intake once diet is advanced, and add PO supplements as needed.  NUTRITION DIAGNOSIS:   Inadequate oral intake related to inability to eat as evidenced by NPO status.  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   Discussed patient in ICU rounds and with RN today. Patient was extubated on 11/2.  S/P MBS 11/3, SLP recommended NPO. Cortrak tube placed on 11/3 for TF. Patient is currently receiving Vital AF 1.2 via Cortrak at 50 ml/h (1200 ml/day) to provide 1440 kcals, 90 gm protein, 973 ml free water daily.  SLP in room with patient during RD visit; hopeful for diet advancement after FEES tomorrow.  Labs reviewed. Phosphorus 1.9 (L) CBG's: [phone] Medications reviewed.  NUTRITION - FOCUSED PHYSICAL EXAM:    Most Recent Value  Orbital Region  Mild depletion  Upper Arm Region  No depletion  Thoracic and Lumbar Region  Unable to assess  Buccal Region  No depletion  Temple Region  Moderate depletion  Clavicle Bone Region  No depletion  Clavicle and Acromion Bone Region  No depletion  Scapular Bone Region  Unable to assess  Dorsal Hand  Unable to assess  Patellar Region  Severe depletion  Anterior Thigh Region  Severe depletion  Posterior Calf Region  Severe depletion  Edema (RD Assessment)  None  Hair  Reviewed  Eyes  Reviewed  Mouth  Reviewed  Skin   Reviewed  Nails  Reviewed       Diet Order:  Diet NPO time specified  EDUCATION NEEDS:   No education needs have been identified at this time  Skin:  Skin Assessment: Skin Integrity Issues:(stage 1 to R heel)  Last BM:  10/25  Height:   Ht Readings from Last 1 Encounters:  11/18/16 '5\' 5"'  (1.651 m)    Weight:   Wt Readings from Last 1 Encounters:  11/22/16 112 lb 3.4 oz (50.9 kg)    Ideal Body Weight:  53.1 kg  BMI:  Body mass index is 18.67 kg/m.  Estimated Nutritional Needs:   Kcal:  1600-1800  Protein:  70-80 gm  Fluid:  1.6-1.8 L   Molli Barrows, RD, LDN, [place] Pager [phone] After Hours Pager [phone]

## 2016-11-23 DIAGNOSIS — I429 Cardiomyopathy, unspecified: Secondary | ICD-10-CM

## 2016-11-23 LAB — CULTURE, BLOOD (ROUTINE X 2)
Culture: NO GROWTH
Culture: NO GROWTH
Special Requests: ADEQUATE
Special Requests: ADEQUATE

## 2016-11-23 LAB — RENAL FUNCTION PANEL
Albumin: 1.5 g/dL — ABNORMAL LOW (ref 3.5–5.0)
Anion gap: 7 (ref 5–15)
BUN: 15 mg/dL (ref 6–20)
CO2: 22 mmol/L (ref 22–32)
Calcium: 7.8 mg/dL — ABNORMAL LOW (ref 8.9–10.3)
Chloride: 115 mmol/L — ABNORMAL HIGH (ref 101–111)
Creatinine, Ser: 0.73 mg/dL (ref 0.44–1.00)
GFR calc Af Amer: >60 mL/min (ref >60)
GFR calc non Af Amer: >60 mL/min (ref >60)
Glucose, Bld: 177 mg/dL — ABNORMAL HIGH (ref 65–99)
Phosphorus: 2 mg/dL — ABNORMAL LOW (ref 2.5–4.6)
Potassium: 3.8 mmol/L (ref 3.5–5.1)
Sodium: 144 mmol/L (ref 135–145)

## 2016-11-23 LAB — CBC WITH DIFFERENTIAL/PLATELET
Basophils Absolute: 0 10*3/uL (ref 0.0–0.1)
Basophils Relative: 0 %
Eosinophils Absolute: 0.2 10*3/uL (ref 0.0–0.7)
Eosinophils Relative: 3 %
HCT: 26.5 % — ABNORMAL LOW (ref 36.0–46.0)
Hemoglobin: 8.5 g/dL — ABNORMAL LOW (ref 12.0–15.0)
Lymphocytes Relative: 25 %
Lymphs Abs: 2.1 10*3/uL (ref 0.7–4.0)
MCH: 27.4 pg (ref 26.0–34.0)
MCHC: 32.1 g/dL (ref 30.0–36.0)
MCV: 85.5 fL (ref 78.0–100.0)
Monocytes Absolute: 0.7 10*3/uL (ref 0.1–1.0)
Monocytes Relative: 8 %
Neutro Abs: 5.6 10*3/uL (ref 1.7–7.7)
Neutrophils Relative %: 64 %
Platelets: 392 10*3/uL (ref 150–400)
RBC: 3.1 MIL/uL — ABNORMAL LOW (ref 3.87–5.11)
RDW: 15.3 % (ref 11.5–15.5)
WBC: 8.6 10*3/uL (ref 4.0–10.5)

## 2016-11-23 LAB — GLUCOSE, CAPILLARY
Glucose-Capillary: 128 mg/dL — ABNORMAL HIGH (ref 65–99)
Glucose-Capillary: 255 mg/dL — ABNORMAL HIGH (ref 65–99)
Glucose-Capillary: 258 mg/dL — ABNORMAL HIGH (ref 65–99)
Glucose-Capillary: 93 mg/dL (ref 65–99)

## 2016-11-23 LAB — MAGNESIUM: Magnesium: 1.8 mg/dL (ref 1.7–2.4)

## 2016-11-23 MED ORDER — RESOURCE THICKENUP CLEAR PO POWD
ORAL | Status: DC | PRN
  Filled 2016-11-23 (×2): qty 125

## 2016-11-23 MED ORDER — CARVEDILOL 3.125 MG PO TABS
3.1250 mg | ORAL_TABLET | Freq: Two times a day (BID) | ORAL | Status: DC
  Administered 2016-11-23 – 2016-11-24 (×2): 3.125 mg via ORAL
  Filled 2016-11-23 (×2): qty 1

## 2016-11-23 NOTE — Evaluation (Signed)
Occupational Therapy Evaluation Patient Details Name: [name] MRN: [id number] DOB: 1970-02-22 Today's Date: 11/23/2016    History of Present Illness Pt is a 46 yo female with PMH including Anxiety; Arterial thromboembolism; Coronary atherosclerosis of native coronary artery; Depression; Facial cellulitis; Glomerulonephritis; History of stroke; Hyperlipidemia; Insulin dependent diabetes mellitus; Ischemic cardiomyopathy; Peripheral arterial disease; Shingles; Left BKA, and ST elevation myocardial infarction (STEMI) of anterior wall. Pt at [facility] from 10/20 to 10/27 with acute pancreatitis and delirium.  No clear cause of her pancreatitis was found.  After discharge home, the family reports that [name] was not compliant with her diabetes regimen.  She didn't have a means to test her blood sugars, and would sporadically use her insulin.  She became progressively more short of breath and nauseous over the past 2 days.  She was also getting dry heaves.  She started getting more confused.  She was brought to ER and found to have severe metabolic acidosis with hyperglycemia consistent with DKA. MRI of head reveals Large areas of late acute/early subacute infarction within the left MCA distribution as well as multiple chronic infarcts. Intubated 11/1 to 11/2. Chest x ray without acute cardiopulmonary disease.    Clinical Impression   Upon arrival, pt alert and supine in bed. Pt unable to provide information on PLOF and home set up and support. Pt currently requiring Max A for ADLs and Max A +2 for bed mobility. Pt presenting with no active movement of RUE, poor cognition, and decreased balance. Pt tearful throughout session and clearly frustrated with her poor functional performance and speech. Pt would benefit from acute OT to facilitate safe dc and increase occupational performance participation. Recommend dc to SNF for further OT to optimize safety and independence with ADLs and functional mobility.      Follow Up Recommendations  SNF    Equipment Recommendations  Other (comment)(Defer to next venue)    Recommendations for Other Services PT consult;Speech consult     Precautions / Restrictions Precautions Precautions: Fall Restrictions Weight Bearing Restrictions: No      Mobility Bed Mobility Overal bed mobility: Needs Assistance Bed Mobility: Rolling;Sidelying to Sit Rolling: Mod assist Sidelying to sit: Max assist;+2 for physical assistance       General bed mobility comments: Pt able to pull at bed rail with VCs to initiate to roll towards R side. Max A +2 to transition hips towards EOB with pad, manage BLEs, and elevate trunk.  Transfers                 General transfer comment: Unable to attempt secondary to RLE weakness and poor cognititon    Balance Overall balance assessment: Needs assistance Sitting-balance support: No upper extremity supported;Feet unsupported Sitting balance-Leahy Scale: Fair Sitting balance - Comments: Able to maintain static sitting with Min Guard A for safety at EOB. Pt sitting EOB ~ 8 Minutes Postural control: Right lateral lean                                 ADL either performed or assessed with clinical judgement   ADL Overall ADL's : Needs assistance/impaired Eating/Feeding: Moderate assistance;Sitting Eating/Feeding Details (indicate cue type and reason): supported sitting Grooming: Wash/dry face;Sitting;Cueing for sequencing;Cueing for safety;Maximal assistance Grooming Details (indicate cue type and reason): Pt requiring Min Guard for safety at EOB. Requiring Max cues to intitiate tasks and wash face. Pt requiring Max A to incorporate RUE. Pt  demosntarting poor crossing of midline with LUE during application of location.  Upper Body Bathing: Maximal assistance;Cueing for sequencing;Cueing for safety;Sitting   Lower Body Bathing: Maximal assistance;Cueing for sequencing;Cueing for safety;Bed level   Upper  Body Dressing : Maximal assistance;Sitting;Cueing for sequencing;Cueing for safety   Lower Body Dressing: Maximal assistance;Cueing for sequencing;Cueing for safety;Bed level                 General ADL Comments: Pt demosntrating decreased functional performance and requires Max A for ADLs and bed mobility. Pt presenting with flaccidity in RUE, poor cognition and speech, and decreased balance.      Vision   Vision Assessment?: Vision impaired- to be further tested in functional context Eye Alignment: Within Functional Limits Alignment/Gaze Preference: Gaze left Additional Comments: Unable to assess due to coginition. Pt inattentive to R side and required Max cues     Perception     Praxis      Pertinent Vitals/Pain Pain Assessment: Faces Faces Pain Scale: Hurts whole lot Pain Location: unable to state Pain Descriptors / Indicators: Moaning;Grimacing;Discomfort Pain Intervention(s): Monitored during session;Repositioned;Limited activity within patient's tolerance     Hand Dominance (Unknown)   Extremity/Trunk Assessment Upper Extremity Assessment Upper Extremity Assessment: RUE deficits/detail RUE Deficits / Details: WFL PROM. Brunnstrum level 1 with no active movement. Edema throughout hand and forearm RUE Coordination: decreased fine motor;decreased gross motor   Lower Extremity Assessment Lower Extremity Assessment: Defer to PT evaluation       Communication Communication Communication: Expressive difficulties   Cognition Arousal/Alertness: Awake/alert Behavior During Therapy: Restless Overall Cognitive Status: No family/caregiver present to determine baseline cognitive functioning Area of Impairment: Memory;Following commands;Safety/judgement;Attention;Problem solving;Awareness                   Current Attention Level: Sustained Memory: Decreased short-term memory Following Commands: Follows one step commands inconsistently;Follows one step commands  with increased time Safety/Judgement: Decreased awareness of safety;Decreased awareness of deficits Awareness: Intellectual Problem Solving: Slow processing;Decreased initiation;Difficulty sequencing;Requires verbal cues;Requires tactile cues     General Comments  Edema in R hand and forearm. Pt tearful throughotu session and frustrated at decreased fucntonal performance and poor communication    Exercises Exercises: General Upper Extremity General Exercises - Upper Extremity Shoulder Flexion: PROM;Right;10 reps;Supine Shoulder Extension: PROM;Right;10 reps;Supine Elbow Flexion: PROM;Right;10 reps;Supine Elbow Extension: PROM;Right;10 reps;Supine Wrist Flexion: PROM;Right;10 reps;Supine Wrist Extension: PROM;Right;10 reps;Supine Digit Composite Flexion: PROM;Right;10 reps;Supine Composite Extension: PROM;Right;10 reps;Supine   Shoulder Instructions      Home Living Family/patient expects to be discharged to:: Private residence Living Arrangements: Children                               Additional Comments: No family present to provide home set up and support. Pt unable to provide accurate information. RN states she has spoken with pt's daughter.      Prior Functioning/Environment          Comments: Pt unable to confirm PLOF due to speech deficits        OT Problem List: Decreased strength;Decreased range of motion;Decreased activity tolerance;Impaired balance (sitting and/or standing);Impaired vision/perception;Decreased coordination;Decreased cognition;Decreased safety awareness;Decreased knowledge of use of DME or AE;Impaired UE functional use      OT Treatment/Interventions: Self-care/ADL training;Therapeutic exercise;Energy conservation;DME and/or AE instruction;Therapeutic activities;Patient/family education    OT Goals(Current goals can be found in the care plan section) Acute Rehab OT Goals Patient Stated Goal: Unstated OT Goal Formulation:  Patient  unable to participate in goal setting Time For Goal Achievement: 12/07/16 Potential to Achieve Goals: Good ADL Goals Pt Will Perform Eating: with min assist;sitting Pt Will Perform Grooming: with min assist;sitting Pt Will Perform Upper Body Bathing: sitting;with mod assist Pt Will Perform Upper Body Dressing: sitting;with mod assist  OT Frequency: Min 3X/week   Barriers to D/C:            Co-evaluation              AM-PAC PT "6 Clicks" Daily Activity     Outcome Measure Help from another person eating meals?: A Lot Help from another person taking care of personal grooming?: A Lot Help from another person toileting, which includes using toliet, bedpan, or urinal?: Total Help from another person bathing (including washing, rinsing, drying)?: A Lot Help from another person to put on and taking off regular upper body clothing?: A Lot Help from another person to put on and taking off regular lower body clothing?: A Lot 6 Click Score: 11   End of Session Nurse Communication: Mobility status;Other (comment)(Pt requesting RN)  Activity Tolerance: Patient limited by fatigue Patient left: in bed;with call bell/phone within reach;with bed alarm set;with SCD's reapplied  OT Visit Diagnosis: Unsteadiness on feet (R26.81);Other abnormalities of gait and mobility (R26.89);Muscle weakness (generalized) (M62.81);Other symptoms and signs involving cognitive function;Hemiplegia and hemiparesis Hemiplegia - Right/Left: Right Hemiplegia - dominant/non-dominant: Dominant(unknown) Hemiplegia - caused by: Cerebral infarction                Time: 1103-1130 OT Time Calculation (min): 27 min Charges:  OT General Charges $OT Visit: 1 Visit OT Evaluation $OT Eval Moderate Complexity: 1 Mod G-Codes:     [place] MSOT, OTR/L Acute Rehab Pager: [phone] Office: [place] 11/23/2016, 12:08 PM

## 2016-11-23 NOTE — Evaluation (Signed)
Physical Therapy Evaluation Patient Details Name: [name] MRN: [id number] DOB: 28-Apr-1970 Today's Date: 11/23/2016   History of Present Illness  Pt is a 46 yo female with PMH including Anxiety; Arterial thromboembolism; Coronary atherosclerosis of native coronary artery; Depression; Facial cellulitis; Glomerulonephritis; History of stroke; Hyperlipidemia; Insulin dependent diabetes mellitus; Ischemic cardiomyopathy; Peripheral arterial disease; Shingles; Left BKA, and ST elevation myocardial infarction (STEMI) of anterior wall. Pt at [facility] from 10/20 to 10/27 with acute pancreatitis and delirium.  No clear cause of her pancreatitis was found.  After discharge home, the family reports that [name] was not compliant with her diabetes regimen.  She didn't have a means to test her blood sugars, and would sporadically use her insulin.  She became progressively more short of breath and nauseous over the past 2 days.  She was also getting dry heaves.  She started getting more confused.  She was brought to ER and found to have severe metabolic acidosis with hyperglycemia consistent with DKA. MRI of head reveals Large areas of late acute/early subacute infarction within the left MCA distribution as well as multiple chronic infarcts. Intubated 11/1 to 11/2. Chest x ray without acute cardiopulmonary disease.   Clinical Impression  Pt admitted with above diagnosis. Pt currently with functional limitations due to the deficits listed below (see PT Problem List). Pt is limited in mobility by R sided weakness and L BKA. Pt is currently, modA for rolling to R and maxAx2 for all other bed mobility.  Pt will benefit from skilled PT to increase their independence and safety with mobility to allow discharge to the venue listed below.       Follow Up Recommendations SNF    Equipment Recommendations  Other (comment)(to be determined at next venue)    Recommendations for Other Services       Precautions /  Restrictions Precautions Precautions: Fall Restrictions Weight Bearing Restrictions: No      Mobility  Bed Mobility Overal bed mobility: Needs Assistance Bed Mobility: Rolling;Sidelying to Sit Rolling: Mod assist Sidelying to sit: Max assist;+2 for physical assistance       General bed mobility comments: Pt able to pull at bed rail with VCs to initiate to roll towards R side. Max A +2 to transition hips towards EOB with pad, manage BLEs, and elevate trunk.  Transfers                 General transfer comment: Unable to attempt secondary to RLE weakness and poor cognititon     Balance Overall balance assessment: Needs assistance Sitting-balance support: No upper extremity supported;Feet unsupported Sitting balance-Leahy Scale: Fair Sitting balance - Comments: Able to maintain static sitting with Min Guard A for safety at EOB. Pt sitting EOB ~ 8 Minutes Postural control: Right lateral lean                                   Pertinent Vitals/Pain Pain Assessment: Faces Faces Pain Scale: Hurts whole lot Pain Location: unable to state Pain Descriptors / Indicators: Moaning;Grimacing;Discomfort Pain Intervention(s): Monitored during session;Repositioned;Limited activity within patient's tolerance    Home Living Family/patient expects to be discharged to:: Private residence Living Arrangements: Children               Additional Comments: No family present to provide home set up and support. Pt unable to provide accurate information. RN states she has spoken with pt's daughter.  Prior Function           Comments: Pt unable to confirm PLOF due to speech deficits     Hand Dominance   Dominant Hand: (Unknown)    Extremity/Trunk Assessment   Upper Extremity Assessment Upper Extremity Assessment: Defer to OT evaluation RUE Deficits / Details: WFL PROM. Brunnstrum level 1 with no active movement. Edema throughout hand and forearm RUE  Coordination: decreased fine motor;decreased gross motor    Lower Extremity Assessment Lower Extremity Assessment: RLE deficits/detail;LLE deficits/detail RLE Deficits / Details: Inconsitent ability to lift from R hip, knee and ankle strength is 0/5 LLE Deficits / Details: L BKA, hip and knee ROM WFL, strength grossly 4-/5       Communication   Communication: Expressive difficulties  Cognition Arousal/Alertness: Awake/alert Behavior During Therapy: Restless Overall Cognitive Status: No family/caregiver present to determine baseline cognitive functioning Area of Impairment: Memory;Following commands;Safety/judgement;Attention;Problem solving;Awareness                   Current Attention Level: Sustained Memory: Decreased short-term memory Following Commands: Follows one step commands inconsistently;Follows one step commands with increased time Safety/Judgement: Decreased awareness of safety;Decreased awareness of deficits Awareness: Intellectual Problem Solving: Slow processing;Decreased initiation;Difficulty sequencing;Requires verbal cues;Requires tactile cues        General Comments General comments (skin integrity, edema, etc.): Edema in R hand and forearm. Pt tearful throughotu session and frustrated at decreased fucntonal performance and poor communication    Exercises General Exercises - Upper Extremity Shoulder Flexion: PROM;Right;10 reps;Supine Shoulder Extension: PROM;Right;10 reps;Supine Elbow Flexion: PROM;Right;10 reps;Supine Elbow Extension: PROM;Right;10 reps;Supine Wrist Flexion: PROM;Right;10 reps;Supine Wrist Extension: PROM;Right;10 reps;Supine Digit Composite Flexion: PROM;Right;10 reps;Supine Composite Extension: PROM;Right;10 reps;Supine   Assessment/Plan    PT Assessment Patient needs continued PT services  PT Problem List Decreased strength;Decreased range of motion;Decreased activity tolerance;Decreased balance;Decreased mobility;Decreased  coordination;Decreased knowledge of use of DME;Decreased safety awareness;Impaired sensation;Pain       PT Treatment Interventions Functional mobility training;Therapeutic activities;Therapeutic exercise;Balance training    PT Goals (Current goals can be found in the Care Plan section)  Acute Rehab PT Goals Patient Stated Goal: Unstated PT Goal Formulation: Patient unable to participate in goal setting Time For Goal Achievement: 12/07/16 Potential to Achieve Goals: Fair    Frequency Min 2X/week   Barriers to discharge        Co-evaluation PT/OT/SLP Co-Evaluation/Treatment: Yes Reason for Co-Treatment: Complexity of the patient's impairments (multi-system involvement);For patient/therapist safety;Necessary to address cognition/behavior during functional activity PT goals addressed during session: Mobility/safety with mobility;Balance OT goals addressed during session: ADL's and self-care;Strengthening/ROM       AM-PAC PT "6 Clicks" Daily Activity  Outcome Measure Difficulty turning over in bed (including adjusting bedclothes, sheets and blankets)?: Unable Difficulty moving from lying on back to sitting on the side of the bed? : Unable Difficulty sitting down on and standing up from a chair with arms (e.g., wheelchair, bedside commode, etc,.)?: Unable Help needed moving to and from a bed to chair (including a wheelchair)?: Total Help needed walking in hospital room?: Total Help needed climbing 3-5 steps with a railing? : Total 6 Click Score: 6    End of Session   Activity Tolerance: Patient limited by fatigue Patient left: in bed;with call bell/phone within reach;with bed alarm set Nurse Communication: Mobility status PT Visit Diagnosis: Other abnormalities of gait and mobility (R26.89);Muscle weakness (generalized) (M62.81);Difficulty in walking, not elsewhere classified (R26.2);Other symptoms and signs involving the nervous system (R29.898);Pain Pain - Right/Left:  Right Pain -  part of body: Arm;Hand;Leg    Time: 1102-1129 PT Time Calculation (min) (ACUTE ONLY): 27 min   Charges:   PT Evaluation $PT Eval High Complexity: 1 High     PT G Codes:        Kaci Freel B. Migdalia Dk PT, DPT Acute Rehabilitation  [phone] Pager [phone]    [place] 11/23/2016, 12:23 PM

## 2016-11-23 NOTE — Progress Notes (Signed)
Progress Note  Patient Name: [name] Date of Encounter: 11/23/2016  Primary Cardiologist: Dr. Domenic Polite (last seen 10/2014)  Subjective   Aphasic. Try to communicate.   Inpatient Medications    Scheduled Meds: . aspirin  324 mg Per Tube Daily  . atorvastatin  80 mg Oral q1800  . carvedilol  3.125 mg Oral BID WC  . cephALEXin  500 mg Per Tube Q12H  . fluconazole  100 mg Per Tube Daily  . heparin  5,000 Units Subcutaneous Q8H  . insulin aspart  0-9 Units Subcutaneous Q4H  . insulin glargine  10 Units Subcutaneous Daily  . mouth rinse  15 mL Mouth Rinse BID   Continuous Infusions: . feeding supplement (GLUCERNA 1.2 CAL) Stopped (11/23/16 1044)  . lactated ringers 10 mL/hr at 11/22/16 0956   PRN Meds: acetaminophen, fentaNYL (SUBLIMAZE) injection, LORazepam, ondansetron (ZOFRAN) IV, RESOURCE THICKENUP CLEAR   Vital Signs    Vitals:   11/22/16 2139 11/23/16 0025 11/23/16 0522 11/23/16 1010  BP: 132/65 133/61 (!) 154/71 (!) 151/67  Pulse: (!) 109 99 100 96  Resp: 20 18 18 18   Temp: 98.5 F (36.9 C) 98.7 F (37.1 C) 98.9 F (37.2 C) (!) 97.5 F (36.4 C)  TempSrc: Oral Oral Oral Oral  SpO2: 95% 96% 97% 95%  Weight: 109 lb 2 oz (49.5 kg)     Height: 5\' 1"  (1.549 m)       Intake/Output Summary (Last 24 hours) at 11/23/2016 1207 Last data filed at 11/23/2016 1100 Gross per 24 hour  Intake 1388.17 ml  Output 850 ml  Net 538.17 ml   Filed Weights   11/21/16 0449 11/22/16 0450 11/22/16 2139  Weight: 98 lb 5.2 oz (44.6 kg) 112 lb 3.4 oz (50.9 kg) 109 lb 2 oz (49.5 kg)    Telemetry    SR at rate of 90s- Personally Reviewed  ECG   N/A  Physical Exam   GEN: No acute distress.   Neck: [name] Cardiac: RRR, no murmurs, rubs, or gallops.  Respiratory: Clear to auscultation bilaterally. GI: Soft, nontender, non-distended  MS: No edema; No deformity. Neuro:  Aphasic, not able to move RUE Psych:  [place]  Lab  11/17/16 1103  11/21/16 0453 11/22/16 0242 11/23/16 0520  NA 131*   < > 142 144 144  K 6.3*   < > 3.6 3.5 3.8  CL 96*   < > 113* 116* 115*  CO2 <7*   < > 16* 19* 22  GLUCOSE 1,429*   < > 299* 199* 177*  BUN 40*   < > 20 16 15   CREATININE 2.76*   < > 0.94 0.89 0.73  CALCIUM 8.3*   < > 8.0* 7.9* 7.8*  PROT 5.2*  --   --   --   --   ALBUMIN 2.0*  --   --   --  1.5*  AST 45*  --   --   --   --   ALT 12*  --   --   --   --   ALKPHOS 110  --   --   --   --   BILITOT 1.8*  --   --   --   --   GFRNONAA 19*   < > >60 >60 >60  GFRAA 22*   < > >60 >60 >60  ANIONGAP NOT CALCULATED   < > 13 9 7    < > =  values in this interval not displayed.     Hematology Recent Labs  Lab 11/21/16 0453 11/22/16 0242 11/23/16 0520  WBC 11.5* 9.2 8.6  RBC 3.35* 3.39* 3.10*  HGB 9.3* 9.3* 8.5*  HCT 28.2* 28.8* 26.5*  MCV 84.2 85.0 85.5  MCH 27.8 27.4 27.4  MCHC 33.0 32.3 32.1  RDW 14.9 15.3 15.3  PLT 359 234 392    Radiology    Dg Chest Port 1 View  Result Date: 11/22/2016 CLINICAL DATA:  Respiratory failure. EXAM: PORTABLE CHEST 1 VIEW COMPARISON:  Radiograph of November 21, 2016. FINDINGS: The heart size and mediastinal contours are within normal limits. No pneumothorax is noted. Feeding tube is seen entering stomach. Bibasilar opacities are noted concerning for atelectasis or edema with mild associated pleural effusions. The visualized skeletal structures are unremarkable. IMPRESSION: Stable bibasilar atelectasis or edema with mild associated pleural effusions. Electronically Signed   By: Marijo Conception, M.D.   On: 11/22/2016 07:46    Cardiac Studies   Echo 11/21/16 Study Conclusions  - Left ventricle: The cavity size was normal. Wall thickness was   increased in a pattern of mild LVH. Systolic function was   moderately to severely reduced. The estimated ejection fraction   was in the range of 30% to 35%. There is hypokinesis of the   anteroseptal myocardium. There is akinesis of the  apical   myocardium. Doppler parameters are consistent with abnormal left   ventricular relaxation (grade 1 diastolic dysfunction). Doppler   parameters are consistent with high ventricular filling pressure. - Mitral valve: Calcified annulus. Mildly thickened leaflets .   There was mild regurgitation.  Impressions:  - Akinesis of the distal septum and apex with overall moderate to   severe LV dysfunction; mild diastolic dysfunction; mild LVH;   prominent apical trabeculae but no clear thrombus; mild MR.  Patient Profile     [name] is a 46 y.o. female with a hx of CAD s/p DES to LAD in 2014, PVD, s/p L BKA,  Prior embolectomy (previously on coumadin), tobacco abuse, prior CVA admitted with  severe metabolic acidosis with DKA in setting of non compliant with diabetic medications. Found to have acute/subacute Left MCA infract. CTA neck confirmed left ICA occlusion. Neurology has recommended ASA for now and then resume eliquis in one week post stroke if no planned procedure. Cardiology is consulted for low LVEF.  Assessment & Plan     1. Acute systolic CHF with prior hx of ICM - Echo this admission showed LVEF of 30-35% (down from 60-65% in  2016) with Akinesis of the distal septum and apex. mild diastolic dysfunction; mild LVH; prominent apical trabeculae but no clear thrombus; mild MR.  - She has hx of ICM with normalization of LVEF on last echo in 2016. - Low dose Coreg added today (allow permissive hypertension). Consider ACE/ ARB however allergic to losartan.  2. CAD s/p DES to LAD in 2014 - Continue ASA  3. 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 Previously on coumadin. Neurology is recommended Eliquis (start date 11/25/16)  4. DM - A1c 14.3. Per primary team.   For questions or updates, please contact [place] Please consult [email or web address] for contact info under Cardiology/STEMI.      Jarrett Soho, PA  11/23/2016,  12:07 PM    Patient seen and examined. Agree with assessment and plan. HR now in the 90s with addition of low dose carvedilol at 3.125 mg bid. Will continue  same dose today and possibly titrarte to 6.25 mg bid tomorrow. No chest pain. Anticoagulation to start later in week per neurology.    Troy Sine, MD, [facility] 11/23/2016 2:31 PM

## 2016-11-23 NOTE — Telephone Encounter (Signed)
MRCP was approved from 11/23/16-12/23/16. Auth # [id number] via [employer]

## 2016-11-23 NOTE — Procedures (Signed)
Objective Swallowing Evaluation: Type of Study: FEES-Fiberoptic Endoscopic Evaluation of Swallow   Patient Details  Name: [name] MRN: [id number] Date of Birth: 1970/05/18  Today's Date: 11/23/2016 Time: SLP Start Time (ACUTE ONLY): 4431 -SLP Stop Time (ACUTE ONLY): 0936  SLP Time Calculation (min) (ACUTE ONLY): 20 min   Past Medical History:  Past Medical History:  Diagnosis Date  . Anxiety   . 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 Coronary atherosclerosis of native coronary artery    DES to LAD September 2014  . Depression   . Facial cellulitis    12/2010  . Glomerulonephritis   . Headache(784.0)   . History of stroke   . Hyperlipidemia   . Insulin dependent diabetes mellitus ([place])    History of diabetic ketoacidosis  . Ischemic cardiomyopathy    LVEF 40-45%  . Peripheral arterial disease ([place])   . Shingles    11/2010  . ST elevation myocardial infarction (STEMI) of anterior wall [facility])    Late presentation September 2014   Past Surgical History:  Past Surgical History:  Procedure Laterality Date  . DILATION AND CURETTAGE OF UTERUS    . MULTIPLE TOOTH EXTRACTIONS    . WRIST SURGERY     Left   HPI: Pt is a 46 yo female at [facility] from 10/20 to 10/27 with acute pancreatitis and delirium. No clear cause of her pancreatitis was found. After discharge home, the family reports that Ms. Hemberger was not compliant with her diabetes regimen. She didn't have a means to test her blood sugars, and would sporadically use her insulin. She became progressively more short of breath and nauseous over the past 2 days. She was also getting dry heaves. She started getting more confused. She was brought to ER and found to have severe metabolic acidosis with hyperglycemia consistent with DKA. MRI of head reveals Large areas of late acute/early subacute infarction within the left MCA distribution as well as multiple chronic infarcts. Intubated  11/1 to 11/2. Chest x ray without acute cardiopulmonary disease.    Subjective: Pt nods head when questioned if she understands SLP    Assessment / Plan / Recommendation  CHL IP CLINICAL IMPRESSIONS 11/23/2016  Clinical Impression Pt demonstrates significant improvement in swallow function on FEES, also likely due to improved mentation and willingness to take PO rather than barium. Primary impairment is slightly slow initiation of airway closure and hyolaryngeal excursion resulting in sensed penetration aspiration before the swallow with large sips of thin liquids and flash penetration with large sips of nectar. Laryngeal adduction visibly complete despite mild excrecense on bilateral posterior vocal folds. No signs of pharyngeal weakness Recommend pt initaite a dys 3 (mechanical soft) diet with nectar thick liquids. Expect PO intake to be very good as pt has been eager to eat and drink. WOuld not expect ongoing need for NG tube. Will follow for tolerance.   SLP Visit Diagnosis Dysphagia, oropharyngeal phase (R13.12)  Attention and concentration deficit following --  Frontal lobe and executive function deficit following --  Impact on safety and function Mild aspiration risk      CHL IP TREATMENT RECOMMENDATION 11/20/2016  Treatment Recommendations F/U MBS in --- days (Comment);F/U FEES in --- days (Comment)     Prognosis 11/23/2016  Prognosis for Safe Diet Advancement Good  Barriers to Reach Goals --  Barriers/Prognosis Comment --    CHL IP DIET RECOMMENDATION 11/23/2016  SLP Diet Recommendations Dysphagia 3 (Mech soft) solids;Nectar thick  liquid  Liquid Administration via Cup;Straw  Medication Administration Whole meds with puree  Compensations Slow rate;Small sips/bites  Postural Changes Seated upright at 90 degrees      CHL IP OTHER RECOMMENDATIONS 11/23/2016  Recommended Consults --  Oral Care Recommendations Oral care BID  Other Recommendations --      CHL IP FOLLOW UP  RECOMMENDATIONS 11/23/2016  Follow up Recommendations Inpatient Rehab      CHL IP FREQUENCY AND DURATION 11/23/2016  Speech Therapy Frequency (ACUTE ONLY) min 2x/week  Treatment Duration 2 weeks           CHL IP ORAL PHASE 11/23/2016  Oral Phase WFL  Oral - Pudding Teaspoon --  Oral - Pudding Cup --  Oral - Honey Teaspoon WFL  Oral - Honey Cup --  Oral - Nectar Teaspoon --  Oral - Nectar Cup --  Oral - Nectar Straw --  Oral - Thin Teaspoon --  Oral - Thin Cup --  Oral - Thin Straw --  Oral - Puree WFL  Oral - Mech Soft --  Oral - Regular --  Oral - Multi-Consistency --  Oral - Pill --  Oral Phase - Comment --    CHL IP PHARYNGEAL PHASE 11/23/2016  Pharyngeal Phase Impaired  Pharyngeal- Pudding Teaspoon --  Pharyngeal --  Pharyngeal- Pudding Cup --  Pharyngeal --  Pharyngeal- Honey Teaspoon WFL  Pharyngeal --  Pharyngeal- Honey Cup --  Pharyngeal --  Pharyngeal- Nectar Teaspoon WFL  Pharyngeal --  Pharyngeal- Nectar Cup Delayed swallow initiation-vallecula  Pharyngeal --  Pharyngeal- Nectar Straw Delayed swallow initiation-pyriform sinuses  Pharyngeal --  Pharyngeal- Thin Teaspoon --  Pharyngeal --  Pharyngeal- Thin Cup Penetration/Aspiration before swallow;Delayed swallow initiation-pyriform sinuses  Pharyngeal Material enters airway, CONTACTS cords and then ejected out;Material enters airway, passes BELOW cords then ejected out  Pharyngeal- Thin Straw --  Pharyngeal --  Pharyngeal- Puree WFL  Pharyngeal --  Pharyngeal- Mechanical Soft Pharyngeal residue - valleculae  Pharyngeal --  Pharyngeal- Regular --  Pharyngeal --  Pharyngeal- Multi-consistency --  Pharyngeal --  Pharyngeal- Pill --  Pharyngeal --  Pharyngeal Comment --     No flowsheet data found.  No flowsheet data found.  Jonavin Seder, Katherene Ponto 11/23/2016, 10:00 AM

## 2016-11-23 NOTE — Care Management Note (Addendum)
Case Management Note  Patient Details  Name: [name] MRN: [id number] Date of Birth: 11-15-1970  Subjective/Objective:    Pt admitted with DKA and CVA. She is from home with her daughter.                 Action/Plan: Pt passed swallow eval today. Awaiting PT/OT evals. CM following for d/c disposition.  Expected Discharge Date:                  Expected Discharge Plan:     In-House Referral:     Discharge planning Services  CM Consult  Post Acute Care Choice:    Choice offered to:     DME Arranged:    DME Agency:     HH Arranged:    HH Agency:     Status of Service:  In process, will continue to follow  If discussed at Long Length of Stay Meetings, dates discussed:    Additional Comments:  Pollie Friar, RN 11/23/2016, 10:50 AM

## 2016-11-23 NOTE — Progress Notes (Signed)
PROGRESS NOTE [facility]   [name]   UJW:[id number] DOB: 1970-03-28  DOA: 11/17/2016 PCP: Sinda Du, MD   Brief Narrative:  [name] is a 46 year old female with past medical history of diabetes, chronic kidney disease, CAD, peripheral vascular disease and anxiety presented to the emergency department with short of breath, nausea and dry heaves leading to altered mental status.  Upon ED evaluation she was found to be on metabolic acidosis due to DKA and she was  intubated due to unresponsiveness.  Patient was admitted to the ICU where an MRI was done noted to have large MCA infarct with global aphasia and right hemiplegia.  During hospital course patient developed pleural effusions, which was treated with IV Lasix.  Neurology was consulted, cardiology was also consulted for possible acute coronary event.  Patient lupus anticoagulant positive.  Patient transferred to try and hospitalist for further management.  Subjective: Patient seen and examined, speaks some words but mainly nonverbal.  Follow commands, patient passed swallow eval.  No acute events overnight.  CBGs remained stable.  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 Continue aspirin 325 mg and Lipitor daily  Patient passed swallow eval, DC NG tube  DKA with Coma - in setting of diabetes type 2 uncontrolled with hyperglycemia Initially intubated, successfully extubated on 11/2 Acidosis has resolved, patient on Lantus 10 units subcutaneous daily and insulin sliding scale every 4 hours.  CBGs slight above goal, now the patient is using oral route will adjust Lantus increased to 50 mg daily.  Monitor requirement over 24 hours and continue to adjust.  A1c 56.2  Acute systolic CHF in setting of prior history of ischemic  cardiomyopathy Suspected acute myocardial infarction -suggested by focal wall motion abnormality on echo, EF 30-35% Cardiology following -they have started low-dose Coreg as we are allowing permissive hypertension due to stroke.  May need ACE/ARB however patient is allergic to losartan.  Eventually will need Aldactone as well. Continue to monitor  Acute renal failure - due to hypoperfusion Resolved, monitor creatinine  Left internal carotid artery occlusion Seen on carotid duplex, carotid artery completely occluded.  Will discuss with vascular surgery  Anemia Unclear etiology at this time, suspect in the setting of acute illness.  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 Neurology recommending resume systemic anticoagulation 1 week for stroke   Lactobacillus UTI Keflex 500 mg twice daily x 3 days  Remove Foley  DVT prophylaxis: Subq heparin Code Status: Full code Family Communication: None at bedside Disposition Plan: To be determined  Consultants:  Neurology Cardiology  Procedures:     Antimicrobials: Anti-infectives (From admission, onward)   Start     Dose/Rate Route Frequency Ordered Stop   11/23/16 1000  fluconazole (DIFLUCAN) 40 MG/ML suspension 100 mg     100 mg Per Tube Daily 11/22/16 1431     11/22/16 1645  cephALEXin (KEFLEX) 250 MG/5ML suspension 500 mg     500 mg Per Tube Every 12 hours 11/22/16 1535 11/25/16 2159   11/19/16 1215  fluconazole (DIFLUCAN) IVPB 100 mg  Status:  Discontinued     100 mg 50 mL/hr over 60 Minutes Intravenous Every 24 hours 11/19/16 1208 11/22/16 1431  11/18/16 1645  fluconazole (DIFLUCAN) tablet 100 mg  Status:  Discontinued     100 mg Per Tube Daily 11/18/16 1634 11/19/16 1208        Objective: Vitals:   11/23/16 0025 11/23/16 0522 11/23/16 1010 11/23/16 1336  BP: 133/61 (!) 154/71 (!) 151/67  140/60  Pulse: 99 100 96 90  Resp: 18 18 18 18   Temp: 98.7 F (37.1 C) 98.9 F (37.2 C) (!) 97.5 F (36.4 C) 99 F (37.2 C)  TempSrc: Oral Oral Oral Oral  SpO2: 96% 97% 95% 95%  Weight:      Height:        Intake/Output Summary (Last 24 hours) at 11/23/2016 1633 Last data filed at 11/23/2016 1300 Gross per 24 hour  Intake 1508.17 ml  Output 850 ml  Net 658.17 ml   Filed Weights   11/21/16 0449 11/22/16 0450 11/22/16 2139  Weight: 44.6 kg (98 lb 5.2 oz) 50.9 kg (112 lb 3.4 oz) 49.5 kg (109 lb 2 oz)    Examination:  General exam: Appears calm and comfortable  Respiratory system: Clear to auscultation. No wheezes,crackle or rhonchi Cardiovascular system: S1 & S2 heard, RRR. [name], murmurs, rubs or gallops Gastrointestinal system: Abdomen is nondistended, soft and nontender. Normal bowel sounds heard. Central nervous system: Mostly aphasic, follow commands, R hemiparesis Extremities: R UE and LE edema, Left BKA  Skin: No rashes Psychiatry: Unable to evaluate   Data Reviewed: I have personally reviewed following labs and imaging studies  CBC: Recent Labs  Lab 11/19/16 0235 11/20/16 0356 11/21/16 0453 11/22/16 0242 11/23/16 0520  WBC 19.0* 13.9* 11.5* 9.2 8.6  NEUTROABS  --   --   --   --  5.6  HGB 10.2* 9.6* 9.3* 9.3* 8.5*  HCT 30.0* 29.4* 28.2* 28.8* 26.5*  MCV 81.1 84.2 84.2 85.0 85.5  PLT 524* 365 359 234 009   Basic Metabolic Panel: Recent Labs  Lab 11/18/16 0459  11/19/16 0235 11/20/16 0356 11/21/16 0453 11/21/16 1938 11/22/16 0242 11/23/16 0520  NA 145   < > 144 141 142  --  144 144  K 4.7   < > 3.8 4.8 3.6  --  3.5 3.8  CL 119*   < > 118* 113* 113*  --  116* 115*  CO2 14*   < > 19* 15* 16*  --  19* 22  GLUCOSE 153*   < > 121* 336* 299*  --  199* 177*  BUN 31*   < > 25* 18 20  --  16 15  CREATININE 1.54*   < > 1.27* 1.10* 0.94  --  0.89 0.73  CALCIUM 7.6*   < > 8.0* 7.9* 8.0*  --  7.9* 7.8*  MG 1.9  --  1.8  --  1.6* 2.0 1.8 1.8  PHOS 1.9*  --   3.5  --  2.4*  --  1.9* 2.0*   < > = values in this interval not displayed.   GFR: Estimated Creatinine Clearance: 65.6 mL/min (by C-G formula based on SCr of 0.73 mg/dL). Liver Function Tests: Recent Labs  Lab 11/17/16 1103 11/23/16 0520  AST 45*  --   ALT 12*  --   ALKPHOS 110  --   BILITOT 1.8*  --   PROT 5.2*  --   ALBUMIN 2.0* 1.5*   Recent Labs  Lab 11/17/16 1103  LIPASE 78*   No results for input(s): AMMONIA in the last 168 hours. Coagulation Profile: No results for input(s):  INR, PROTIME in the last 168 hours. Cardiac Enzymes: No results for input(s): CKTOTAL, CKMB, CKMBINDEX, TROPONINI in the last 168 hours. BNP (last 3 results) No results for input(s): PROBNP in the last 8760 hours. HbA1C: Recent Labs    11/21/16 0453  HGBA1C 13.6*   CBG: Recent Labs  Lab 11/22/16 0800 11/22/16 1134 11/22/16 1602 11/22/16 1933 11/23/16 1133  GLUCAP 211* 201* 172* 70 258*   Lipid Profile: No results for input(s): CHOL, HDL, LDLCALC, TRIG, CHOLHDL, LDLDIRECT in the last 72 hours. Thyroid Function Tests: No results for input(s): TSH, T4TOTAL, FREET4, T3FREE, THYROIDAB in the last 72 hours. Anemia Panel: No results for input(s): VITAMINB12, FOLATE, FERRITIN, TIBC, IRON, RETICCTPCT in the last 72 hours. Sepsis Labs: Recent Labs  Lab 11/17/16 1316 11/17/16 1737 11/18/16 1246 11/19/16 0235 11/20/16 0356  PROCALCITON  --   --  0.60 0.32 <0.10  LATICACIDVEN 2.93* 2.40*  --   --   --     Recent Results (from the past 240 hour(s))  MRSA PCR Screening     Status: None   Collection Time: 11/17/16  5:43 PM  Result Value Ref Range Status   MRSA by PCR NEGATIVE NEGATIVE Final    Comment:        The GeneXpert MRSA Assay (FDA approved for NASAL specimens only), is one component of a comprehensive MRSA colonization surveillance program. It is not intended to diagnose MRSA infection nor to guide or monitor treatment for MRSA infections.   Urine Culture     Status:  Abnormal   Collection Time: 11/18/16  2:56 PM  Result Value Ref Range Status   Specimen Description URINE, CATHETERIZED  Final   Special Requests NONE  Final   Culture (A)  Final    >=100,000 COLONIES/mL LACTOBACILLUS SPECIES Standardized susceptibility testing for this organism is not available.    Report Status 11/19/2016 FINAL  Final  Culture, blood (Routine X 2) w Reflex to ID Panel     Status: None   Collection Time: 11/18/16  3:10 PM  Result Value Ref Range Status   Specimen Description BLOOD LEFT HAND  Final   Special Requests IN PEDIATRIC BOTTLE Blood Culture adequate volume  Final   Culture NO GROWTH 5 DAYS  Final   Report Status 11/23/2016 FINAL  Final  Culture, blood (Routine X 2) w Reflex to ID Panel     Status: None   Collection Time: 11/18/16  3:20 PM  Result Value Ref Range Status   Specimen Description BLOOD LEFT HAND  Final   Special Requests IN PEDIATRIC BOTTLE Blood Culture adequate volume  Final   Culture NO GROWTH 5 DAYS  Final   Report Status 11/23/2016 FINAL  Final      Radiology Studies: Dg Chest Port 1 View  Result Date: 11/22/2016 CLINICAL DATA:  Respiratory failure. EXAM: PORTABLE CHEST 1 VIEW COMPARISON:  Radiograph of November 21, 2016. FINDINGS: The heart size and mediastinal contours are within normal limits. No pneumothorax is noted. Feeding tube is seen entering stomach. Bibasilar opacities are noted concerning for atelectasis or edema with mild associated pleural effusions. The visualized skeletal structures are unremarkable. IMPRESSION: Stable bibasilar atelectasis or edema with mild associated pleural effusions. Electronically Signed   By: Marijo Conception, M.D.   On: 11/22/2016 07:46      Scheduled Meds: . aspirin  324 mg Per Tube Daily  . atorvastatin  80 mg Oral q1800  . carvedilol  3.125 mg Oral BID WC  . cephALEXin  500 mg Per Tube Q12H  . fluconazole  100 mg Per Tube Daily  . heparin  5,000 Units Subcutaneous Q8H  . insulin aspart  0-9  Units Subcutaneous Q4H  . insulin glargine  10 Units Subcutaneous Daily  . mouth rinse  15 mL Mouth Rinse BID   Continuous Infusions: . feeding supplement (GLUCERNA 1.2 CAL) Stopped (11/23/16 1044)  . lactated ringers 10 mL/hr at 11/22/16 0956     LOS: 6 days    Time spent: Total of 25 minutes spent with pt, greater than 50% of which was spent in discussion of  treatment, counseling and coordination of care   Chipper Oman, MD Pager: Text Page via [email or web address]   If 7PM-7AM, please contact night-coverage [email or web address] 11/23/2016, 4:33 PM

## 2016-11-24 LAB — CBC WITH DIFFERENTIAL/PLATELET
Basophils Absolute: 0 10*3/uL (ref 0.0–0.1)
Basophils Relative: 0 %
Eosinophils Absolute: 0.4 10*3/uL (ref 0.0–0.7)
Eosinophils Relative: 4 %
HCT: 26.6 % — ABNORMAL LOW (ref 36.0–46.0)
Hemoglobin: 8.5 g/dL — ABNORMAL LOW (ref 12.0–15.0)
Lymphocytes Relative: 32 %
Lymphs Abs: 2.6 10*3/uL (ref 0.7–4.0)
MCH: 27.4 pg (ref 26.0–34.0)
MCHC: 32 g/dL (ref 30.0–36.0)
MCV: 85.8 fL (ref 78.0–100.0)
Monocytes Absolute: 0.7 10*3/uL (ref 0.1–1.0)
Monocytes Relative: 9 %
Neutro Abs: 4.4 10*3/uL (ref 1.7–7.7)
Neutrophils Relative %: 55 %
Platelets: 400 10*3/uL (ref 150–400)
RBC: 3.1 MIL/uL — ABNORMAL LOW (ref 3.87–5.11)
RDW: 15 % (ref 11.5–15.5)
WBC: 8 10*3/uL (ref 4.0–10.5)

## 2016-11-24 LAB — GLUCOSE, CAPILLARY
Glucose-Capillary: 96 mg/dL (ref 65–99)
Glucose-Capillary: 132 mg/dL — ABNORMAL HIGH (ref 65–99)
Glucose-Capillary: 151 mg/dL — ABNORMAL HIGH (ref 65–99)
Glucose-Capillary: 190 mg/dL — ABNORMAL HIGH (ref 65–99)
Glucose-Capillary: 83 mg/dL (ref 65–99)
Glucose-Capillary: 185 mg/dL — ABNORMAL HIGH (ref 65–99)
Glucose-Capillary: 231 mg/dL — ABNORMAL HIGH (ref 65–99)
Glucose-Capillary: 239 mg/dL — ABNORMAL HIGH (ref 65–99)

## 2016-11-24 LAB — BASIC METABOLIC PANEL
Anion gap: 7 (ref 5–15)
BUN: 11 mg/dL (ref 6–20)
CO2: 23 mmol/L (ref 22–32)
Calcium: 7.8 mg/dL — ABNORMAL LOW (ref 8.9–10.3)
Chloride: 113 mmol/L — ABNORMAL HIGH (ref 101–111)
Creatinine, Ser: 0.66 mg/dL (ref 0.44–1.00)
GFR calc Af Amer: >60 mL/min (ref >60)
GFR calc non Af Amer: >60 mL/min (ref >60)
Glucose, Bld: 108 mg/dL — ABNORMAL HIGH (ref 65–99)
Potassium: 3.8 mmol/L (ref 3.5–5.1)
Sodium: 143 mmol/L (ref 135–145)

## 2016-11-24 LAB — PHOSPHORUS: Phosphorus: 2.7 mg/dL (ref 2.5–4.6)

## 2016-11-24 MED ORDER — RESOURCE THICKENUP CLEAR PO POWD: 1.0000 | ORAL | 0 refills | Status: DC | PRN

## 2016-11-24 MED ORDER — ASPIRIN EC 81 MG PO TBEC: 81.0000 mg | DELAYED_RELEASE_TABLET | Freq: Every day | ORAL | 2 refills | Status: DC

## 2016-11-24 MED ORDER — ALPRAZOLAM 1 MG PO TABS: 0.5000 mg | ORAL_TABLET | Freq: Three times a day (TID) | ORAL | 0 refills | Status: DC | PRN

## 2016-11-24 MED ORDER — CARVEDILOL 3.125 MG PO TABS: 3.1250 mg | ORAL_TABLET | Freq: Two times a day (BID) | ORAL | 0 refills | Status: DC

## 2016-11-24 MED ORDER — APIXABAN 5 MG PO TABS: 5.0000 mg | ORAL_TABLET | Freq: Two times a day (BID) | ORAL | Status: DC

## 2016-11-24 MED ORDER — FLUCONAZOLE 40 MG/ML PO SUSR: 100.0000 mg | Freq: Every day | ORAL | 0 refills | Status: AC

## 2016-11-24 MED ORDER — INSULIN GLARGINE 100 UNIT/ML SOLOSTAR PEN: 10.0000 [IU] | PEN_INJECTOR | Freq: Every day | SUBCUTANEOUS | 11 refills | Status: DC

## 2016-11-24 MED ORDER — CEPHALEXIN 250 MG/5ML PO SUSR: 500.0000 mg | Freq: Two times a day (BID) | ORAL | 0 refills | Status: AC

## 2016-11-24 MED ORDER — APIXABAN 5 MG PO TABS: 5.0000 mg | ORAL_TABLET | Freq: Two times a day (BID) | ORAL | 0 refills | Status: DC

## 2016-11-24 MED ORDER — OXYCODONE-ACETAMINOPHEN 10-325 MG PO TABS: 0.5000 | ORAL_TABLET | ORAL | 0 refills | Status: DC | PRN

## 2016-11-24 MED ORDER — ATORVASTATIN CALCIUM 80 MG PO TABS: 80.0000 mg | ORAL_TABLET | Freq: Every day | ORAL | 0 refills | Status: DC

## 2016-11-24 NOTE — Discharge Summary (Addendum)
Physician Discharge Summary  [name] TKP:[id number] DOB: 1970/03/01 DOA: 11/17/2016  PCP: Sinda Du, MD  Admit date: 11/17/2016 Discharge date: 11/24/2016  Admitted From: Home  Disposition:  SNF  Recommendations for Outpatient Follow-up:  1. Follow up with PCP in 1-2 weeks 2. Please obtain BMP/CBC in one week 3. Start eliquis 11-25-2016  Discharge Condition; Stable.  CODE STATUS: Full code.  Diet recommendation: Dysphagia 3  Brief/Interim Summary: Brief Narrative:  [name] is a 46 year old female with past medical history of diabetes, chronic kidney disease, CAD, peripheral vascular disease and anxiety presented to the emergency department with short of breath, nausea and dry heaves leading to altered mental status.  Upon ED evaluation she was found to be on metabolic acidosis due to DKA and she was  intubated due to unresponsiveness.  Patient was admitted to the ICU where an MRI was done noted to have large MCA infarct with global aphasia and right hemiplegia.  During hospital course patient developed pleural effusions, which was treated with IV Lasix.  Neurology was consulted, cardiology was also consulted for possible acute coronary event.  Patient lupus anticoagulant positive.  Patient transferred to try and hospitalist for further management.  Subjective: Patient seen and examined, speaks some words but mainly nonverbal.  Follow commands, patient passed swallow eval.  No acute events overnight.  CBGs remained stable.  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 On dysphagia diet.  Stable to be discharge to SNF>   DKA with Coma - in setting of diabetes type 2 uncontrolled with  hyperglycemia Initially intubated, successfully extubated on 11/2 Acidosis has resolved, patient on Lantus 10 units subcutaneous daily and insulin sliding scale .  Monitor requirement over 24 hours and continue to adjust.  A1c 51.7  Acute systolic CHF in setting of prior history of ischemic cardiomyopathy Suspected acute myocardial infarction -suggested by focal wall motion abnormality on echo, EF 30-35% Cardiology following -they have started low-dose Coreg as we are allowing permissive hypertension due to stroke.  May need ACE/ARB however patient is allergic to losartan.  Eventually will need Aldactone as well. Continue to monitor. Eu volemic.   Acute renal failure - due to hypoperfusion Resolved, monitor creatinine  Left internal carotid artery occlusion Seen on carotid duplex, carotid artery completely occluded.  per neurology   Anemia Unclear etiology at this time, suspect in the setting of acute illness.  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 Neurology recommending resume Eliquis 11-25-2016  Lactobacillus UTI Keflex 500 mg twice daily x 3 days   Pressure injury, stage 1 Right heel   Discharge Diagnoses:  Active Problems:   DKA, type 1 ([place])   Acute respiratory failure with hypoxia ([facility])   Cerebral embolism with cerebral infarction   Diabetic ketoacidosis with coma associated with type 1 diabetes mellitus ([place])   Palliative care by specialist   Acute cystitis without hematuria   AKI (acute kidney injury) ([place])   Thrombophilia ([place])   ICAO (internal carotid artery occlusion), left   Pressure injury of skin   Cardiomyopathy [facility])    Discharge Instructions  Discharge Instructions    Ambulatory  referral to Neurology   Complete by:  As directed    Pt will follow up with Dr. Erlinda Hong at [facility] in about 2 months. Thanks.   Diet - low sodium  heart healthy   Complete by:  As directed    Increase activity slowly   Complete by:  As directed      Allergies as of 11/24/2016      Reactions   Ibuprofen Other (See Comments)   Kidney disease   Losartan Swelling      Medication List    STOP taking these medications   promethazine 12.5 MG tablet Commonly known as:  PHENERGAN     TAKE these medications   ALPRAZolam 1 MG tablet Commonly known as:  XANAX Take 0.5 tablets (0.5 mg total) 3 (three) times daily as needed by mouth for anxiety. What changed:    how much to take  when to take this  reasons to take this   apixaban 5 MG Tabs tablet Commonly known as:  ELIQUIS Take 1 tablet (5 mg total) 2 (two) times daily by mouth. Start taking on:  11/25/2016   atorvastatin 80 MG tablet Commonly known as:  LIPITOR Take 1 tablet (80 mg total) daily at 6 PM by mouth.   carvedilol 3.125 MG tablet Commonly known as:  COREG Take 1 tablet (3.125 mg total) 2 (two) times daily with a meal by mouth.   cephALEXin 250 MG/5ML suspension Commonly known as:  KEFLEX Take 10 mLs (500 mg total) every 12 (twelve) hours for 2 days by mouth.   fluconazole 40 MG/ML suspension Commonly known as:  DIFLUCAN Place 2.5 mLs (100 mg total) daily for 3 days into feeding tube. Start taking on:  11/25/2016   gabapentin 300 MG capsule Commonly known as:  NEURONTIN Take 300 mg by mouth at bedtime.   Insulin Glargine 100 UNIT/ML Solostar Pen Commonly known as:  LANTUS SOLOSTAR Inject 10 Units at bedtime into the skin. What changed:  how much to take   nitroGLYCERIN 0.4 MG SL tablet Commonly known as:  NITROSTAT Place 1 tablet (0.4 mg total) under the tongue every 5 (five) minutes as needed for chest pain.   NOVOLOG FLEXPEN 100 UNIT/ML injection Generic drug:  insulin aspart Inject 2-10 Units into the skin 3 (three) times daily before meals. Sliding scale as follows 200-250=2 units 251-300=4 units 301-350=6 units 351-400=8 units >400-give  10 units and Notify MD   oxyCODONE-acetaminophen 10-325 MG tablet Commonly known as:  PERCOCET Take 0.5 tablets every 4 (four) hours as needed by mouth for pain. What changed:    how much to take  when to take this  reasons to take this   RESOURCE THICKENUP CLEAR Powd Take 120 g as needed by mouth.      Follow-up Information    Rosalin Hawking, MD. Schedule an appointment as soon as possible for a visit in 6 week(s).   Specialty:  Neurology Contact information: [address] [place] [address] [phone]          Allergies  Allergen Reactions  . Ibuprofen Other (See Comments)    Kidney disease  . Losartan Swelling    Consultations:  Neurology  Cardiology    Procedures/Studies: Ct Head Wo Contrast  Result Date: 11/17/2016 CLINICAL DATA:  Altered level of consciousness. EXAM: CT HEAD WITHOUT CONTRAST TECHNIQUE: Contiguous axial images were obtained from the base of the skull through the vertex without intravenous contrast. COMPARISON:  09/30/2012 FINDINGS: Brain: Old infarcts in  the right frontal lobe. Low-density noted in the left parietal lobe is new since 2014 compatible with age-indeterminate infarct. Chronic microvascular disease in the deep white matter. No hydrocephalus. Mild age advanced cerebral atrophy. Vascular: No hyperdense vessel or unexpected calcification. Skull: No acute calvarial abnormality. Sinuses/Orbits: Visualized paranasal sinuses and mastoids clear. Orbital soft tissues unremarkable. Other: None IMPRESSION: Old right frontal infarct. New low-density scratched at low-density area in the left parietal lobe compatible with infarct, age indeterminate, new since 2014. Atrophy, chronic microvascular disease. Electronically Signed   By: Rolm Baptise M.D.   On: 11/17/2016 12:13   Ct Angio Neck W Or Wo Contrast  Result Date: 11/21/2016 CLINICAL DATA:  46 year old  female with respiratory failure, presented with pancreatitis, DKA, and  altered mental status. Found have acute to subacute left MCA territory infarcts and left ICA occlusion. EXAM: CT ANGIOGRAPHY NECK TECHNIQUE: Multidetector CT imaging of the neck was performed using the standard protocol during bolus administration of intravenous contrast. Multiplanar CT image reconstructions and MIPs were obtained to evaluate the vascular anatomy. Carotid stenosis measurements (when applicable) are obtained utilizing NASCET criteria, using the distal internal carotid diameter as the denominator. CONTRAST:  50 mL Isovue 370 COMPARISON:  Brain MRI and intracranial MRA 11/18/2016. FINDINGS: Skeleton: Absent dentition. No acute osseous abnormality identified. Visualized paranasal sinuses and mastoids are stable and well pneumatized. Upper chest: Moderate to large bilateral layering pleural effusions. Evidence of compressive atelectasis, but also non dependent distal left upper lobe peribronchial opacity. Evidence of reactive mediastinal lymph nodes. Enteric tube courses into the thoracic esophagus. Other neck: Right nasoenteric tube in place. Otherwise negative. No cervical lymphadenopathy. Aortic arch: 3 vessel arch configuration. No significant arch atherosclerosis. Right carotid system: Mild soft plaque in the right CCA without stenosis. Mild soft and calcified plaque at the right ICA origin and bulb without stenosis. Left carotid system: Patent left CCA with only mild soft plaque. Patent left carotid bifurcation, but the left ICA occludes at the level of the bulb with a flame shaped termination (series 12, image 28). No reconstituted flow to the skullbase. Vertebral arteries:No proximal subclavian artery stenosis despite mild soft plaque. Both vertebral artery origins are normal. The left vertebral artery is dominant and patent to the skullbase without stenosis. The right vertebral artery is non dominant and diminutive but patent to the skullbase. Visible Intracranial: Posterior Circulation: The non  dominant distal right vertebral artery functionally terminates in PICA. Dominant distal left vertebral artery with normal PICA origin. Left V4 segment calcified plaque with only mild stenosis. Patent vertebrobasilar junction. Visible basilar artery is patent without stenosis. Anterior circulation: The proximal left ICA siphon is appears occluded there is faint, thread-like reconstituted flow at the level cavernous left ICA (series 7, image 4). At the same time the right ICA siphon is circumferentially narrow did at the level of the proximal cavernous segment as seen on series 7, image 5. Right ICA siphon stenosis was also visible on the comparison MRA. Furthermore, the MRA revealed a prominent right posterior communicating artery above which the right ICA terminus had a more normal caliber. Review of the MIP images confirms the above findings IMPRESSION: 1. Occluded Left ICA in the neck at the level of the bulb. Flame shaped termination of the vessel at the bulb such that differential considerations for occlusion include a left ICA dissection (however, see also #2). Faint, thread-like reconstitution only at the visible left cavernous ICA. 2. Patent cervical right ICA with only mild atherosclerosis in the neck, however,  moderate to severe circumferential tapering of the vessel - apparently due to soft plaque - is noted in the visible right ICA siphon. And the recent MRA demonstrated similar right ICA siphon stenosis with collateral supply to the right ICA terminus from the Right Pcomm. Therefore, severe soft atherosclerotic plaque might be the more likely explanation for the left ICA occlusion in #1. 3. Moderate to large bilateral layering pleural effusions. 4. Superimposed patchy peripheral left upper lobe opacity. Consider developing acute left lung infection. 5. Normal vertebral arteries, the left is dominant. Electronically Signed   By: Genevie Ann M.D.   On: 11/21/2016 10:36   Mr Virgel Paling GG Contrast  Result  Date: 11/18/2016 CLINICAL DATA:  46 y/o  F; a mobile right arm. EXAM: MRI HEAD WITHOUT AND WITH CONTRAST MRA HEAD WITHOUT CONTRAST TECHNIQUE: Multiplanar, multiecho pulse sequences of the brain and surrounding structures were obtained without and with intravenous contrast. Angiographic images of the head were obtained using MRA technique without contrast. CONTRAST:  88mL MULTIHANCE GADOBENATE DIMEGLUMINE 529 MG/ML IV SOLN COMPARISON:  11/17/2016 CT of the head. 04/11/2016 MRI and MRA of the head. FINDINGS: MRI HEAD FINDINGS Brain: Large area of reduced diffusion involving the left lateral frontal lobe, second large area of reduced diffusion involving the left posterior temporal, parietal, and occipital lobes. Small foci of reduced diffusion are present within left caudate head, putamen, and scattered within the left anterolateral frontal and temporal lobes. The areas of reduced diffusion demonstrate T2 FLAIR hyperintensity and mild local mass effect. Findings likely represent late acute to early subacute infarctions. There is incomplete mild enhancement of the infarctions. No abnormal susceptibility hypointensity to indicate acute intracranial hemorrhage. Small chronic cortical infarctions are present in the right superomedial frontal lobe, right lateral parietal lobe, right inferior cerebellar hemisphere, and the right occipital lobe. There is minimal hemosiderin deposition within the right superomedial frontal lobe chronic infarction. Mass effect from infarcts within the left hemisphere results in partial effacement of the left lateral ventricle and 3 mm of left-to-right midline shift. No herniation. Vascular: Loss of flow void within the left internal carotid artery compatible with occlusion. Skull and upper cervical spine: Normal marrow signal. Sinuses/Orbits: Negative. Other: None. MRA HEAD FINDINGS Motion degraded study. Suboptimal evaluation for subtle stenosis or small aneurysm. Internal carotid arteries:  Patent right intracranial ICA. No flow related signal within the left ICA upper cervical, petrous, and cavernous segments. Faint flow is present within the left ICA at the ophthalmic and paraclinoid segments with reconstitution of flow with the terminus. Anterior cerebral arteries: Patent. Large right A1, diminutive left A1, and large anterior communicating artery, normal variant, stable. Middle cerebral arteries: Patent right middle cerebral artery and distal circulation. Patent left M1 with diminished flow related signal in the left MCA distribution. Anterior communicating artery: Patent. Posterior communicating arteries: Large right posterior communicating artery. Small left posterior communicating artery partially obscured by motion artifact. Posterior cerebral arteries:  Patent. Basilar artery:  Patent. Vertebral arteries:  Patent.  Left dominant IMPRESSION: 1. Large areas of late acute/early subacute infarction within the left MCA distribution. No associated hemorrhage. Mild mass effect with partial effacement of left lateral ventricle and 3 mm left-to-right midline shift. 2. Left internal carotid artery occlusion with partial reconstitution of flow the level of the terminus. Asymmetric diminished flow related signal in left MCA distribution in comparison with right. 3. Multiple chronic infarctions as above. These results will be called to the ordering clinician or representative by the Radiologist Assistant, and communication  documented in the PACS or zVision Dashboard. Electronically Signed   By: Kristine Garbe M.D.   On: 11/18/2016 18:46   Mr Jeri Cos ZO Contrast  Result Date: 11/18/2016 CLINICAL DATA:  46 y/o  F; a mobile right arm. EXAM: MRI HEAD WITHOUT AND WITH CONTRAST MRA HEAD WITHOUT CONTRAST TECHNIQUE: Multiplanar, multiecho pulse sequences of the brain and surrounding structures were obtained without and with intravenous contrast. Angiographic images of the head were obtained using MRA  technique without contrast. CONTRAST:  26mL MULTIHANCE GADOBENATE DIMEGLUMINE 529 MG/ML IV SOLN COMPARISON:  11/17/2016 CT of the head. 04/11/2016 MRI and MRA of the head. FINDINGS: MRI HEAD FINDINGS Brain: Large area of reduced diffusion involving the left lateral frontal lobe, second large area of reduced diffusion involving the left posterior temporal, parietal, and occipital lobes. Small foci of reduced diffusion are present within left caudate head, putamen, and scattered within the left anterolateral frontal and temporal lobes. The areas of reduced diffusion demonstrate T2 FLAIR hyperintensity and mild local mass effect. Findings likely represent late acute to early subacute infarctions. There is incomplete mild enhancement of the infarctions. No abnormal susceptibility hypointensity to indicate acute intracranial hemorrhage. Small chronic cortical infarctions are present in the right superomedial frontal lobe, right lateral parietal lobe, right inferior cerebellar hemisphere, and the right occipital lobe. There is minimal hemosiderin deposition within the right superomedial frontal lobe chronic infarction. Mass effect from infarcts within the left hemisphere results in partial effacement of the left lateral ventricle and 3 mm of left-to-right midline shift. No herniation. Vascular: Loss of flow void within the left internal carotid artery compatible with occlusion. Skull and upper cervical spine: Normal marrow signal. Sinuses/Orbits: Negative. Other: None. MRA HEAD FINDINGS Motion degraded study. Suboptimal evaluation for subtle stenosis or small aneurysm. Internal carotid arteries: Patent right intracranial ICA. No flow related signal within the left ICA upper cervical, petrous, and cavernous segments. Faint flow is present within the left ICA at the ophthalmic and paraclinoid segments with reconstitution of flow with the terminus. Anterior cerebral arteries: Patent. Large right A1, diminutive left A1, and  large anterior communicating artery, normal variant, stable. Middle cerebral arteries: Patent right middle cerebral artery and distal circulation. Patent left M1 with diminished flow related signal in the left MCA distribution. Anterior communicating artery: Patent. Posterior communicating arteries: Large right posterior communicating artery. Small left posterior communicating artery partially obscured by motion artifact. Posterior cerebral arteries:  Patent. Basilar artery:  Patent. Vertebral arteries:  Patent.  Left dominant IMPRESSION: 1. Large areas of late acute/early subacute infarction within the left MCA distribution. No associated hemorrhage. Mild mass effect with partial effacement of left lateral ventricle and 3 mm left-to-right midline shift. 2. Left internal carotid artery occlusion with partial reconstitution of flow the level of the terminus. Asymmetric diminished flow related signal in left MCA distribution in comparison with right. 3. Multiple chronic infarctions as above. These results will be called to the ordering clinician or representative by the Radiologist Assistant, and communication documented in the PACS or zVision Dashboard. Electronically Signed   By: Kristine Garbe M.D.   On: 11/18/2016 18:46   Ct Abdomen Pelvis W Contrast  Result Date: 11/07/2016 CLINICAL DATA:  Nausea, vomiting, diarrhea, and right upper quadrant pain starting yesterday. EXAM: CT ABDOMEN AND PELVIS WITH CONTRAST TECHNIQUE: Multidetector CT imaging of the abdomen and pelvis was performed using the standard protocol following bolus administration of intravenous contrast. CONTRAST:  65mL ISOVUE-300 IOPAMIDOL (ISOVUE-300) INJECTION 61%, 137mL ISOVUE-300 IOPAMIDOL (ISOVUE-300) INJECTION  61% COMPARISON:  08/11/2010 FINDINGS: Lower chest: The lung bases are clear. Hepatobiliary: No focal liver abnormality is seen. No gallstones, gallbladder wall thickening, or biliary dilatation. Pancreas: Unremarkable. No  pancreatic ductal dilatation or surrounding inflammatory changes. Spleen: Normal in size without focal abnormality. Adrenals/Urinary Tract: No adrenal gland nodules. Cyst in the upper pole right kidney. No solid renal mass lesions. Nephrograms are symmetrical. No hydronephrosis or hydroureter. Bladder wall is not thickened and no bladder filling defects are identified. Stomach/Bowel: Stomach and small bowel are mostly decompressed. Contrast material is demonstrated in the distal small bowel suggesting no evidence of obstruction. Scattered stool in the colon. Colon is mostly decompressed as well. No inflammatory bowel changes are demonstrated. The appendix is normal. Vascular/Lymphatic: Diffuse calcification of the abdominal aorta without aneurysm. Possible calcific stenosis of the iliac arteries bilaterally. The iliac arteries and external iliac arteries remain patent. Reproductive: Uterus and bilateral adnexa are unremarkable. Other: No free air or free fluid in the abdomen. No loculated collections. Abdominal wall musculature appears intact. Musculoskeletal: No acute or significant osseous findings. IMPRESSION: No acute process demonstrated in the abdomen or pelvis. No evidence of bowel obstruction or inflammation. Diffuse aortic atherosclerosis with possible calcific stenosis in the iliac arteries. Electronically Signed   By: Lucienne Capers M.D.   On: 11/07/2016 00:08   US Abdomen Limited  Result Date: 11/07/2016 CLINICAL DATA:  Evaluate for gallstones.  Acute pancreatitis. EXAM: ULTRASOUND ABDOMEN LIMITED RIGHT UPPER QUADRANT COMPARISON:  CT scan from earlier today FINDINGS: Gallbladder: No gallstones or wall thickening visualized. No sonographic Murphy sign noted by sonographer. Common bile duct: Diameter: 4.2 mm Liver: No focal lesion identified. Within normal limits in parenchymal echogenicity. Portal vein is patent on color Doppler imaging with normal direction of blood flow towards the liver.  IMPRESSION: Normal study.  No cholelithiasis. Electronically Signed   By: Dorise Bullion III M.D   On: 11/07/2016 12:43   Dg Chest Port 1 View  Result Date: 11/22/2016 CLINICAL DATA:  Respiratory failure. EXAM: PORTABLE CHEST 1 VIEW COMPARISON:  Radiograph of November 21, 2016. FINDINGS: The heart size and mediastinal contours are within normal limits. No pneumothorax is noted. Feeding tube is seen entering stomach. Bibasilar opacities are noted concerning for atelectasis or edema with mild associated pleural effusions. The visualized skeletal structures are unremarkable. IMPRESSION: Stable bibasilar atelectasis or edema with mild associated pleural effusions. Electronically Signed   By: Marijo Conception, M.D.   On: 11/22/2016 07:46   Dg Chest Port 1 View  Result Date: 11/21/2016 CLINICAL DATA:  46 year old female with respiratory failure, presented with pancreatitis, DKA, and altered mental status. Found have acute to subacute left MCA territory infarcts. EXAM: PORTABLE CHEST 1 VIEW COMPARISON:  11/19/2016 and earlier. FINDINGS: Portable AP semi upright view at 0447 hours. Extubated. Enteric feeding tube in place, tip not included. Increased mild veiling opacity at both lung bases. Stable cardiac size and mediastinal contours. Stable pulmonary vascularity without edema. No pneumothorax. No consolidation. Coronary artery stent again noted. Small volume barium at the splenic flexure (s/p modified barium swallow yesterday). IMPRESSION: 1. Extubated.  Feeding tube in place. 2. Increased veiling opacity at both lung bases suggesting small pleural effusions and/or atelectasis. Electronically Signed   By: Genevie Ann M.D.   On: 11/21/2016 07:39   Dg Chest Port 1 View  Result Date: 11/19/2016 CLINICAL DATA:  Intubation. EXAM: PORTABLE CHEST 1 VIEW COMPARISON:  11/18/2016 . FINDINGS: Endotracheal tube and NG tube in stable position. Heart size normal. No  focal infiltrate. No pleural effusion or pneumothorax.  IMPRESSION: 1. Lines and tubes in stable position. 2. No acute cardiopulmonary disease . Chest is stable from prior exam. Electronically Signed   By: Marcello Moores  Register   On: 11/19/2016 07:38   Dg Chest Port 1 View  Result Date: 11/18/2016 CLINICAL DATA:  Intubated patient, acute respiratory failure, diabetes, current smoker. EXAM: PORTABLE CHEST 1 VIEW COMPARISON:  Portable chest x-ray of November 17, 2016 FINDINGS: An endotracheal tube is been placed whose tip projects 3.4 cm above the carina. The esophagogastric tube tip and proximal port project below the GE junction. The lungs are well-expanded. There is no focal infiltrate. There is no pleural effusion or pneumothorax. The heart and pulmonary vascularity are normal. The bony thorax is unremarkable. IMPRESSION: Interval intubation of the trachea and esophagus with reasonable positioning of the tubes. Clear lungs. No CHF. Electronically Signed   By: David  Martinique M.D.   On: 11/18/2016 15:06   Dg Chest Portable 1 View  Result Date: 11/17/2016 CLINICAL DATA:  Unresponsive.  Hyperglycemia. EXAM: PORTABLE CHEST 1 VIEW COMPARISON:  January 07, 2013 FINDINGS: Lungs are clear. Heart size and pulmonary vascularity are normal. No adenopathy. There is an apparent coronary stent on the left. No evident bone lesions. No pneumothorax. IMPRESSION: No edema or consolidation. Electronically Signed   By: Lowella Grip III M.D.   On: 11/17/2016 11:10   Dg Swallowing Func-speech Pathology  Result Date: 11/20/2016 Objective Swallowing Evaluation: Type of Study: MBS-Modified Barium Swallow Study Patient Details Name: [name] MRN: [id number] Date of Birth: Mar 12, 1970 Today's Date: 11/20/2016 Time: SLP Start Time (ACUTE ONLY): 1118-SLP Stop Time (ACUTE ONLY): 1133 SLP Time Calculation (min) (ACUTE ONLY): 15 min Past Medical History: Past Medical History: Diagnosis Date . Anxiety  . 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 Coronary  atherosclerosis of native coronary artery   DES to LAD September 2014 . Depression  . Facial cellulitis   12/2010 . Glomerulonephritis  . Headache(784.0)  . History of stroke  . Hyperlipidemia  . Insulin dependent diabetes mellitus ([place])   History of diabetic ketoacidosis . Ischemic cardiomyopathy   LVEF 40-45% . Peripheral arterial disease ([place])  . Shingles   11/2010 . ST elevation myocardial infarction (STEMI) of anterior wall [facility])   Late presentation September 2014 Past Surgical History: Past Surgical History: Procedure Laterality Date . AMPUTATION  03/03/2011  Procedure: AMPUTATION DIGIT;  Surgeon: Newt Minion, MD;  Location: [place];  Service: Orthopedics;  Laterality: Left;  Left Foot Amputation 4th and 5th toes at MTP joint . AMPUTATION  04/22/2011  Procedure: AMPUTATION BELOW KNEE;  Surgeon: Newt Minion, MD;  Location: [place];  Service: Orthopedics;  Laterality: Left;  Left Below Knee Amputation . DILATION AND CURETTAGE OF UTERUS   . EMBOLECTOMY  01/29/2011  Procedure: EMBOLECTOMY;  Surgeon: Mal Misty, MD;  Location: [facility] OR;  Service: Vascular;  Laterality: Left;  Left Popliteal and Tibial Embolectomy with patch angioplasty . INCISION AND DRAINAGE ABSCESS N/A 04/06/2016  Procedure: INCISION AND DRAINAGE ABSCESS;  Surgeon: Jonnie Kind, MD;  Location: [facility];  Service: Gynecology;  Laterality: N/A; . LEFT HEART CATHETERIZATION WITH CORONARY ANGIOGRAM N/A 10/01/2012  Procedure: LEFT HEART CATHETERIZATION WITH CORONARY ANGIOGRAM;  Surgeon: Peter M Martinique, MD;  Location: [facility] CATH LAB;  Service: Cardiovascular;  Laterality: N/A; . LOWER EXTREMITY ANGIOGRAM N/A 01/27/2011  Procedure: LOWER EXTREMITY ANGIOGRAM;  Surgeon: Rosetta Posner, MD;  Location: [facility] CATH LAB;  Service: Cardiovascular;  Laterality: N/A; . LOWER EXTREMITY ANGIOGRAM N/A 01/28/2011  Procedure: LOWER EXTREMITY ANGIOGRAM;  Surgeon: Conrad Little River, MD;  Location: [facility] CATH LAB;  Service: Cardiovascular;  Laterality: N/A; . LOWER EXTREMITY ANGIOGRAM Left  01/29/2011  Procedure: LOWER EXTREMITY ANGIOGRAM;  Surgeon: Mal Misty, MD;  Location: [facility] CATH LAB;  Service: Cardiovascular;  Laterality: Left; Marland Kitchen MULTIPLE TOOTH EXTRACTIONS   . TEE WITHOUT CARDIOVERSION  04/15/2011  Procedure: TRANSESOPHAGEAL ECHOCARDIOGRAM (TEE);  Surgeon: [name], MD;  [facility];  Service: Cardiovascular;  Laterality: N/A; . WRIST SURGERY    Left HPI: Pt is a 46 yo female at [facility] from 10/20 to 10/27 with acute pancreatitis and delirium. No clear cause of her pancreatitis was found. After discharge home, the family reports that Ms. Cwikla was not compliant with her diabetes regimen. She didn't have a means to test her blood sugars, and would sporadically use her insulin. She became progressively more short of breath and nauseous over the past 2 days. She was also getting dry heaves. She started getting more confused. She was brought to ER and found to have severe metabolic acidosis with hyperglycemia consistent with DKA. MRI of head reveals Large areas of late acute/early subacute infarction within the left MCA distribution as well as multiple chronic infarcts. Intubated 11/1 to 11/2. Chest x ray without acute cardiopulmonary disease.  Subjective: Pt nods head when questioned if she understands SLP Assessment / Plan / Recommendation CHL IP CLINICAL IMPRESSIONS 11/20/2016 Clinical Impression Patient presents with suspected moderate oropharyngeal dysphagia with sensory and motor impairments secondary to neurologic deficits and recent, brief intubation. This objective study was very limited as pt began gagging when 3rd bolus was administered. Pt grew very anxious, moaning in pain and was unable to continue the study. Despite limited observation, noted oral weakness resulting in decreased bolus cohesion, piecemeal swallowing. Tongue base retraction, hyolaryngeal excursion and pharyngeal constriction reduced, resulting in mild-moderate pharyngeal residue. Swallow  initiation delayed with puree, with reversible penetration during the swallow. Pt coughing between frames; no observed aspiration but ? reduced airway protection with secretions, possibly diluted residue. As teaspoon bolus of honey was administered, pt began gagging, with backflow of vallecular puree residue into the oral cavity, which pt expectorated along with honey-thick bolus. Pt began crying, moaning, HR elevated. SLP provided encouragement, relaxation techniques. Pt calmed slightly but she continued moaning and was unable to continue the exam. Given her clinical signs of aspiration at bedside and observed impairments in this brief exam, she is at moderate-severe risk for aspiration. Recommend she remain NPO until she is able to participate in repeat instrumental exam; will follow to determine readiness for repeat MBS or possible FEES Monday. Overall prognosis good, though she will likely need temporary means of alternative nutrition. SLP Visit Diagnosis Dysphagia, oropharyngeal phase (R13.12) Attention and concentration deficit following -- Frontal lobe and executive function deficit following -- Impact on safety and function Moderate aspiration risk;Severe aspiration risk;Other (comment)   CHL IP TREATMENT RECOMMENDATION 11/20/2016 Treatment Recommendations F/U MBS in --- days (Comment);F/U FEES in --- days (Comment)   Prognosis 11/20/2016 Prognosis for Safe Diet Advancement Good Barriers to Reach Goals Language deficits;Time post onset;Severity of deficits Barriers/Prognosis Comment -- CHL IP DIET RECOMMENDATION 11/20/2016 SLP Diet Recommendations Alternative means - temporary;NPO Liquid Administration via -- Medication Administration Via alternative means Compensations -- Postural Changes --   CHL IP OTHER RECOMMENDATIONS 11/20/2016 Recommended Consults -- Oral Care Recommendations Oral care QID Other Recommendations --   CHL  IP FOLLOW UP RECOMMENDATIONS 11/20/2016 Follow up Recommendations Inpatient Rehab   CHL  IP FREQUENCY AND DURATION 11/20/2016 Speech Therapy Frequency (ACUTE ONLY) min 2x/week Treatment Duration 2 weeks      CHL IP ORAL PHASE 11/20/2016 Oral Phase Impaired Oral - Pudding Teaspoon -- Oral - Pudding Cup -- Oral - Honey Teaspoon Right anterior bolus loss;Other (Comment) Oral - Honey Cup -- Oral - Nectar Teaspoon -- Oral - Nectar Cup -- Oral - Nectar Straw -- Oral - Thin Teaspoon -- Oral - Thin Cup -- Oral - Thin Straw -- Oral - Puree Piecemeal swallowing;Delayed oral transit;Decreased bolus cohesion;Pocketing in anterior sulcus Oral - Mech Soft -- Oral - Regular -- Oral - Multi-Consistency -- Oral - Pill -- Oral Phase - Comment --  CHL IP PHARYNGEAL PHASE 11/20/2016 Pharyngeal Phase Impaired Pharyngeal- Pudding Teaspoon -- Pharyngeal -- Pharyngeal- Pudding Cup -- Pharyngeal -- Pharyngeal- Honey Teaspoon -- Pharyngeal -- Pharyngeal- Honey Cup -- Pharyngeal -- Pharyngeal- Nectar Teaspoon -- Pharyngeal -- Pharyngeal- Nectar Cup -- Pharyngeal -- Pharyngeal- Nectar Straw -- Pharyngeal -- Pharyngeal- Thin Teaspoon -- Pharyngeal -- Pharyngeal- Thin Cup -- Pharyngeal -- Pharyngeal- Thin Straw -- Pharyngeal -- Pharyngeal- Puree Reduced pharyngeal peristalsis;Reduced anterior laryngeal mobility;Reduced laryngeal elevation;Reduced tongue base retraction;Pharyngeal residue - valleculae;Penetration/Aspiration during swallow;Delayed swallow initiation-vallecula Pharyngeal Material enters airway, remains ABOVE vocal cords then ejected out Pharyngeal- Mechanical Soft -- Pharyngeal -- Pharyngeal- Regular -- Pharyngeal -- Pharyngeal- Multi-consistency -- Pharyngeal -- Pharyngeal- Pill -- Pharyngeal -- Pharyngeal Comment --  No flowsheet data found. No flowsheet data found. Aliene Altes 11/20/2016, 3:20 PM  Deneise Lever, MS, CCC-SLP Speech-Language Pathologist [phone]               Subjective: Patient aphasic.   Discharge Exam: Vitals:   11/24/16 1332 11/24/16 1352  BP: (!) 142/58 (!) 149/65  Pulse: 83 88   Resp: 18 18  Temp: 98.3 F (36.8 C) 98.8 F (37.1 C)  SpO2: 96% 98%   Vitals:   11/24/16 0457 11/24/16 0750 11/24/16 1332 11/24/16 1352  BP: 139/70 (!) 154/68 (!) 142/58 (!) 149/65  Pulse: 97 86 83 88  Resp: 20  18 18   Temp: 98.4 F (36.9 C) 98.8 F (37.1 C) 98.3 F (36.8 C) 98.8 F (37.1 C)  TempSrc: Oral Oral Oral Oral  SpO2: 98% 98% 96% 98%  Weight: 45 kg (99 lb 3.3 oz)     Height:        General: Pt is alert, awake, not in acute distress Cardiovascular: RRR, S1/S2 +, no rubs, no gallops Respiratory: CTA bilaterally, no wheezing, no rhonchi Abdominal: Soft, NT, ND, bowel sounds + Extremities: no edema, no cyanosis. Left AKA>  Neuro; aphasia. Right side upper extremity weakness.     The results of significant diagnostics from this hospitalization (including imaging, microbiology, ancillary and laboratory) are listed below for reference.     Microbiology: Recent Results (from the past 240 hour(s))  MRSA PCR Screening     Status: None   Collection Time: 11/17/16  5:43 PM  Result Value Ref Range Status   MRSA by PCR NEGATIVE NEGATIVE Final    Comment:        The GeneXpert MRSA Assay (FDA approved for NASAL specimens only), is one component of a comprehensive MRSA colonization surveillance program. It is not intended to diagnose MRSA infection nor to guide or monitor treatment for MRSA infections.   Urine Culture     Status: Abnormal   Collection Time: 11/18/16  2:56 PM  Result Value Ref Range Status   Specimen Description URINE, CATHETERIZED  Final   Special Requests NONE  Final   Culture (A)  Final    >=100,000 COLONIES/mL LACTOBACILLUS SPECIES Standardized susceptibility testing for this organism is not available.    Report Status 11/19/2016 FINAL  Final  Culture, blood (Routine X 2) w Reflex to ID Panel     Status: None   Collection Time: 11/18/16  3:10 PM  Result Value Ref Range Status   Specimen Description BLOOD LEFT HAND  Final   Special  Requests IN PEDIATRIC BOTTLE Blood Culture adequate volume  Final   Culture NO GROWTH 5 DAYS  Final   Report Status 11/23/2016 FINAL  Final  Culture, blood (Routine X 2) w Reflex to ID Panel     Status: None   Collection Time: 11/18/16  3:20 PM  Result Value Ref Range Status   Specimen Description BLOOD LEFT HAND  Final   Special Requests IN PEDIATRIC BOTTLE Blood Culture adequate volume  Final   Culture NO GROWTH 5 DAYS  Final   Report Status 11/23/2016 FINAL  Final     Labs: BNP (last 3 results) No results for input(s): BNP in the last 8760 hours. Basic Metabolic Panel: Recent Labs  Lab 11/19/16 0235 11/20/16 0356 11/21/16 0453 11/21/16 1938 11/22/16 0242 11/23/16 0520 11/24/16 0553 11/24/16 0800  NA 144 141 142  --  144 144 143  --   K 3.8 4.8 3.6  --  3.5 3.8 3.8  --   CL 118* 113* 113*  --  116* 115* 113*  --   CO2 19* 15* 16*  --  19* 22 23  --   GLUCOSE 121* 336* 299*  --  199* 177* 108*  --   BUN 25* 18 20  --  16 15 11   --   CREATININE 1.27* 1.10* 0.94  --  0.89 0.73 0.66  --   CALCIUM 8.0* 7.9* 8.0*  --  7.9* 7.8* 7.8*  --   MG 1.8  --  1.6* 2.0 1.8 1.8  --   --   PHOS 3.5  --  2.4*  --  1.9* 2.0*  --  2.7   Liver Function Tests: Recent Labs  Lab 11/23/16 0520  ALBUMIN 1.5*   No results for input(s): LIPASE, AMYLASE in the last 168 hours. No results for input(s): AMMONIA in the last 168 hours. CBC: Recent Labs  Lab 11/20/16 0356 11/21/16 0453 11/22/16 0242 11/23/16 0520 11/24/16 0553  WBC 13.9* 11.5* 9.2 8.6 8.0  NEUTROABS  --   --   --  5.6 4.4  HGB 9.6* 9.3* 9.3* 8.5* 8.5*  HCT 29.4* 28.2* 28.8* 26.5* 26.6*  MCV 84.2 84.2 85.0 85.5 85.8  PLT 365 359 234 392 400   Cardiac Enzymes: No results for input(s): CKTOTAL, CKMB, CKMBINDEX, TROPONINI in the last 168 hours. BNP: Invalid input(s): POCBNP CBG: Recent Labs  Lab 11/23/16 2338 11/24/16 0343 11/24/16 0824 11/24/16 1112 11/24/16 1215  GLUCAP 128* 96 132* 185* 231*   D-Dimer No  results for input(s): DDIMER in the last 72 hours. Hgb A1c No results for input(s): HGBA1C in the last 72 hours. Lipid Profile No results for input(s): CHOL, HDL, LDLCALC, TRIG, CHOLHDL, LDLDIRECT in the last 72 hours. Thyroid function studies No results for input(s): TSH, T4TOTAL, T3FREE, THYROIDAB in the last 72 hours.  Invalid input(s): FREET3 Anemia work up No results for input(s): VITAMINB12, FOLATE, FERRITIN, TIBC, IRON, RETICCTPCT in the last  72 hours. Urinalysis    Component Value Date/Time   COLORURINE YELLOW 11/18/2016 1458   APPEARANCEUR HAZY (A) 11/18/2016 1458   LABSPEC 1.017 11/18/2016 1458   PHURINE 5.0 11/18/2016 1458   GLUCOSEU 150 (A) 11/18/2016 1458   HGBUR MODERATE (A) 11/18/2016 1458   BILIRUBINUR NEGATIVE 11/18/2016 1458   KETONESUR 20 (A) 11/18/2016 1458   PROTEINUR >=300 (A) 11/18/2016 1458   UROBILINOGEN 0.2 09/30/2012 1959   NITRITE NEGATIVE 11/18/2016 1458   LEUKOCYTESUR SMALL (A) 11/18/2016 1458   Sepsis Labs Invalid input(s): PROCALCITONIN,  WBC,  LACTICIDVEN Microbiology Recent Results (from the past 240 hour(s))  MRSA PCR Screening     Status: None   Collection Time: 11/17/16  5:43 PM  Result Value Ref Range Status   MRSA by PCR NEGATIVE NEGATIVE Final    Comment:        The GeneXpert MRSA Assay (FDA approved for NASAL specimens only), is one component of a comprehensive MRSA colonization surveillance program. It is not intended to diagnose MRSA infection nor to guide or monitor treatment for MRSA infections.   Urine Culture     Status: Abnormal   Collection Time: 11/18/16  2:56 PM  Result Value Ref Range Status   Specimen Description URINE, CATHETERIZED  Final   Special Requests NONE  Final   Culture (A)  Final    >=100,000 COLONIES/mL LACTOBACILLUS SPECIES Standardized susceptibility testing for this organism is not available.    Report Status 11/19/2016 FINAL  Final  Culture, blood (Routine X 2) w Reflex to ID Panel     Status:  None   Collection Time: 11/18/16  3:10 PM  Result Value Ref Range Status   Specimen Description BLOOD LEFT HAND  Final   Special Requests IN PEDIATRIC BOTTLE Blood Culture adequate volume  Final   Culture NO GROWTH 5 DAYS  Final   Report Status 11/23/2016 FINAL  Final  Culture, blood (Routine X 2) w Reflex to ID Panel     Status: None   Collection Time: 11/18/16  3:20 PM  Result Value Ref Range Status   Specimen Description BLOOD LEFT HAND  Final   Special Requests IN PEDIATRIC BOTTLE Blood Culture adequate volume  Final   Culture NO GROWTH 5 DAYS  Final   Report Status 11/23/2016 FINAL  Final     Time coordinating discharge: Over 30 minutes  SIGNED:   Elmarie Shiley, MD  [facility] 11/24/2016, 2:31 PM Pager [phone]  If 7PM-7AM, please contact night-coverage [email or web address] Password [email or web address]

## 2016-11-24 NOTE — Progress Notes (Signed)
Called and gave nurse report to SNF. Pt discharged by ambulance via stretcher. All of belongings and prescriptions given to transport.

## 2016-11-24 NOTE — Progress Notes (Signed)
Physical Therapy Treatment Patient Details Name: [name] MRN: [id number] DOB: 1970-04-02 Today's Date: 11/24/2016    History of Present Illness Pt is a 46 yo female with PMH including Anxiety; Arterial thromboembolism; Coronary atherosclerosis of native coronary artery; Depression; Facial cellulitis; Glomerulonephritis; History of stroke; Hyperlipidemia; Insulin dependent diabetes mellitus; Ischemic cardiomyopathy; Peripheral arterial disease; Shingles; Left BKA, and ST elevation myocardial infarction (STEMI) of anterior wall. Pt at [facility] from 10/20 to 10/27 with acute pancreatitis and delirium.  No clear cause of her pancreatitis was found.  After discharge home, the family reports that [name] was not compliant with her diabetes regimen.  She didn't have a means to test her blood sugars, and would sporadically use her insulin.  She became progressively more short of breath and nauseous over the past 2 days.  She was also getting dry heaves.  She started getting more confused.  She was brought to ER and found to have severe metabolic acidosis with hyperglycemia consistent with DKA. MRI of head reveals Large areas of late acute/early subacute infarction within the left MCA distribution as well as multiple chronic infarcts. Intubated 11/1 to 11/2. Chest x ray without acute cardiopulmonary disease.     PT Comments    Pt is making good progress towards her goals today. Pt has had return in R LE strength enough to perform sit>stand 4x EoB, with modAx2 progressing to modAx1. Pt R UE has reduced edema today, however continues to have increased flaccidity. Pt requires skilled PT to progress mobility and improve strength and endurance to safely navigate their discharge environment.    Follow Up Recommendations  SNF     Equipment Recommendations  Other (comment)(to be determined at next venue)    Recommendations for Other Services       Precautions / Restrictions Precautions Precautions:  Fall Restrictions Weight Bearing Restrictions: No    Mobility  Bed Mobility Overal bed mobility: Needs Assistance Bed Mobility: Supine to Sit     Supine to sit: Min assist     General bed mobility comments: Pt able to reach to R rail and pull herself to upright and manage her LE off edge of bed, minA needed to scoot hips to EoB  Transfers Overall transfer level: Needs assistance   Transfers: Sit to/from Stand Sit to Stand: Mod assist;+2 physical assistance         General transfer comment: 4x sit<>stand EoB progressing from modAx2 to modAx1 for steadying in upright, pt unable to use RW due to continued flaccidity of R UE       Balance Overall balance assessment: Needs assistance Sitting-balance support: No upper extremity supported;Feet unsupported Sitting balance-Leahy Scale: Fair Sitting balance - Comments: Able to maintain static sitting with Min Guard A for safety at EOB. Pt sitting EOB ~ 8 Minutes Postural control: Right lateral lean Standing balance support: Bilateral upper extremity supported Standing balance-Leahy Scale: Poor Standing balance comment: requires assist for balance                            Cognition Arousal/Alertness: Awake/alert Behavior During Therapy: Restless;Anxious;Impulsive Overall Cognitive Status: Impaired/Different from baseline Area of Impairment: Memory;Following commands;Safety/judgement;Attention;Problem solving;Awareness                   Current Attention Level: Sustained Memory: Decreased short-term memory Following Commands: Follows one step commands inconsistently;Follows one step commands with increased time Safety/Judgement: Decreased awareness of safety;Decreased awareness of deficits Awareness: Intellectual Problem  Solving: Slow processing;Decreased initiation;Difficulty sequencing;Requires verbal cues;Requires tactile cues               Pertinent Vitals/Pain Pain Assessment: Faces Faces Pain  Scale: Hurts even more Pain Location: unable to state Pain Descriptors / Indicators: Moaning;Grimacing;Discomfort Pain Intervention(s): Monitored during session;Limited activity within patient's tolerance           PT Goals (current goals can now be found in the care plan section) Acute Rehab PT Goals Patient Stated Goal: Unstated PT Goal Formulation: Patient unable to participate in goal setting Time For Goal Achievement: 12/07/16 Potential to Achieve Goals: Fair Progress towards PT goals: Progressing toward goals    Frequency    Min 2X/week      PT Plan Current plan remains appropriate       AM-PAC PT "6 Clicks" Daily Activity  Outcome Measure  Difficulty turning over in bed (including adjusting bedclothes, sheets and blankets)?: Unable Difficulty moving from lying on back to sitting on the side of the bed? : Unable Difficulty sitting down on and standing up from a chair with arms (e.g., wheelchair, bedside commode, etc,.)?: Unable Help needed moving to and from a bed to chair (including a wheelchair)?: Total Help needed walking in hospital room?: Total Help needed climbing 3-5 steps with a railing? : Total 6 Click Score: 6    End of Session Equipment Utilized During Treatment: Gait belt Activity Tolerance: Patient limited by fatigue Patient left: in bed;with call bell/phone within reach;with bed alarm set;with family/visitor present Nurse Communication: Mobility status PT Visit Diagnosis: Other abnormalities of gait and mobility (R26.89);Muscle weakness (generalized) (M62.81);Difficulty in walking, not elsewhere classified (R26.2);Other symptoms and signs involving the nervous system (R29.898);Pain Pain - Right/Left: Right Pain - part of body: Arm;Hand;Leg     Time: 8329-1916 PT Time Calculation (min) (ACUTE ONLY): 22 min  Charges:  $Therapeutic Activity: 8-22 mins                    G Codes:       Jabrea Kallstrom B. Migdalia Dk PT, DPT Acute Rehabilitation   [phone] Pager [phone]     [place] 11/24/2016, 5:31 PM

## 2016-11-24 NOTE — Care Management Note (Signed)
Case Management Note  Patient Details  Name: SOCORRO KANITZ MRN: [id number] Date of Birth: March 25, 1970  Subjective/Objective:                    Action/Plan: Pt discharging to [facility] in [place], [place]. No further needs per CM.   Expected Discharge Date:  11/24/16               Expected Discharge Plan:  Skilled Nursing Facility  In-House Referral:  Clinical Social Work  Discharge planning Services  CM Consult  Post Acute Care Choice:    Choice offered to:     DME Arranged:    DME Agency:     HH Arranged:    [place] Agency:     Status of Service:  Completed, signed off  If discussed at [employer], dates discussed:    Additional Comments:  Pollie Friar, RN 11/24/2016, 2:24 PM

## 2016-11-24 NOTE — NC FL2 (Signed)
[place] MEDICAID FL2 LEVEL OF CARE SCREENING TOOL     IDENTIFICATION  Patient Name: [name] Birthdate: 01/12/1971 Sex: female Admission Date (Current Location): 11/17/2016  [facility] and [place] Number:  [employer] and Address:  [facility], [place] [address] [place], [place] [address]      Provider Number: [id number]  Attending Physician Name and Address:  Elmarie Shiley, MD  Relative Name and Phone Number:       Current Level of Care: Hospital Recommended Level of Care: [place] Prior Approval Number:    Date Approved/Denied:   PASRR Number: [id number]  Discharge Plan: SNF    Current Diagnoses: Patient Active Problem List   Diagnosis Date Noted  . Cardiomyopathy ([place])   . Pressure injury of skin 11/21/2016  . Diabetic ketoacidosis with coma associated with type 1 diabetes mellitus ([place])   . Palliative care by specialist   . Acute cystitis without hematuria   . AKI (acute kidney injury) ([place])   . Thrombophilia ([place])   . ICAO (internal carotid artery occlusion), left   . Cerebral embolism with cerebral infarction 11/19/2016  . Acute respiratory failure with hypoxia ([place])   . DKA, type 1 ([place]) 11/17/2016  . Metabolic encephalopathy 19/62/2297  . Pancreatitis, acute 11/07/2016  . Abscess of vulva 04/01/2016  . PAD (peripheral artery disease) ([place]) 11/12/2014  . Coronary atherosclerosis of native coronary artery 02/20/2013  . Cardiomyopathy, ischemic 02/20/2013  . History of stroke 04/13/2011  . Thromboembolism of lower extremity artery ([place]) 02/09/2011  . Peripheral arterial disease ([place])   . Hyperlipidemia   . Tobacco abuse   . Insulin dependent diabetes mellitus with complications ([place]) 98/92/1194    Orientation RESPIRATION BLADDER Height & Weight     Self, Place  Normal Incontinent Weight: 99 lb 3.3 oz (45 kg) Height:  5\' 1"  (154.9 cm)  BEHAVIORAL SYMPTOMS/MOOD NEUROLOGICAL BOWEL NUTRITION  STATUS      Incontinent Diet (mechanical soft, nectar thick liquids)  AMBULATORY STATUS COMMUNICATION OF NEEDS Skin   Extensive Assist Verbally PU Stage and Appropriate Care PU Stage 1 Dressing: No Dressing(right heel)                     Personal Care Assistance Level of Assistance  Bathing, Feeding, Dressing Bathing Assistance: Maximum assistance Feeding assistance: Limited assistance Dressing Assistance: Maximum assistance     Functional Limitations Info  Sight, Hearing, Speech Sight Info: Adequate Hearing Info: Adequate Speech Info: Impaired(expressive aphasia)    SPECIAL CARE FACTORS FREQUENCY  PT (By licensed PT), OT (By licensed OT), Speech therapy     PT Frequency: 5x/wk OT Frequency: 5x/wk     Speech Therapy Frequency: 3x/wk      Contractures Contractures Info: Not present    Additional Factors Info  Code Status, Allergies, Insulin Sliding Scale Code Status Info: Full Allergies Info: Ibuprofen, Losartan   Insulin Sliding Scale Info: Insulin 0-9 units every 4 hours; Lantus 10 units daily       Current Medications (11/24/2016):  This is the current hospital active medication list Current Facility-Administered Medications  Medication Dose Route Frequency Provider Last Rate Last Dose  . acetaminophen (TYLENOL) suppository 650 mg  650 mg Rectal Q4H PRN Sela Hilding, MD   650 mg at 11/21/16 1519  . aspirin chewable tablet 324 mg  324 mg Per Tube Daily Javier Glazier, MD   324 mg at 11/24/16 0931  . atorvastatin (LIPITOR) tablet 80 mg  80 mg Oral q1800 Magdalen Spatz, NP   80 mg at 11/23/16 1802  . carvedilol (COREG) tablet 3.125 mg  3.125 mg Oral BID WC Bhagat, Bhavinkumar, PA   3.125 mg at 11/24/16 0837  . cephALEXin (KEFLEX) 250 MG/5ML suspension 500 mg  500 mg Per Tube Q12H Javier Glazier, MD   500 mg at 11/24/16 0931  . feeding supplement (GLUCERNA 1.2 CAL) liquid 1,000 mL  1,000 mL Per Tube Continuous Javier Glazier, MD   Stopped at  11/23/16 1044  . fentaNYL (SUBLIMAZE) injection 25 mcg  25 mcg Intravenous Q2H PRN Sampson Goon, MD   25 mcg at 11/23/16 1859  . fluconazole (DIFLUCAN) 40 MG/ML suspension 100 mg  100 mg Per Tube Daily Sloan Leiter B, RPH   100 mg at 11/24/16 0932  . heparin injection 5,000 Units  5,000 Units Subcutaneous Q8H Chesley Mires, MD   5,000 Units at 11/24/16 0551  . insulin aspart (novoLOG) injection 0-9 Units  0-9 Units Subcutaneous Q4H Sela Hilding, MD   1 Units at 11/24/16 [phone]  . insulin glargine (LANTUS) injection 10 Units  10 Units Subcutaneous Daily Magdalen Spatz, NP   10 Units at 11/24/16 0932  . lactated ringers infusion   Intravenous Continuous Javier Glazier, MD 10 mL/hr at 11/22/16 [phone]    . LORazepam (ATIVAN) injection 0.5-1 mg  0.5-1 mg Intravenous Q4H PRN Sela Hilding, MD   1 mg at 11/23/16 0137  . MEDLINE mouth rinse  15 mL Mouth Rinse BID Rush Farmer, MD   15 mL at 11/23/16 2159  . ondansetron (ZOFRAN) injection 4 mg  4 mg Intravenous Q6H PRN Chesley Mires, MD   4 mg at 11/18/16 1235  . [place]   Oral PRN Patrecia Pour, Christean Grief, MD         Discharge Medications: Please see discharge summary for a list of discharge medications.  Relevant Imaging Results:  Relevant Lab Results:   Additional Information SS#: [id number]  Geralynn Ochs, LCSW

## 2016-11-24 NOTE — Progress Notes (Addendum)
[name] for apixiban Indication: atrial fibrillation  Allergies  Allergen Reactions  . Ibuprofen Other (See Comments)    Kidney disease  . Losartan Swelling    Patient Measurements: Height: 5\' 1"  (154.9 cm) Weight: 99 lb 3.3 oz (45 kg) IBW/kg (Calculated) : 47.8   Vital Signs: Temp: 98.8 F (37.1 C) (11/07 0750) Temp Source: Oral (11/07 0750) BP: 154/68 (11/07 0750) Pulse Rate: 86 (11/07 0750)  Labs: Recent Labs    11/22/16 0242 11/23/16 0520 11/24/16 0553  HGB 9.3* 8.5* 8.5*  HCT 28.8* 26.5* 26.6*  PLT 234 392 400  CREATININE 0.89 0.73 0.66    Estimated Creatinine Clearance: 61.8 mL/min (by C-G formula based on SCr of 0.66 mg/dL).   Medical History: Past Medical History:  Diagnosis Date  . Anxiety   . Arterial thromboembolism ([place])    Prior embolectomy, previously on Coumadin  . Coronary atherosclerosis of native coronary artery    DES to LAD September 2014  . Depression   . Facial cellulitis    12/2010  . Glomerulonephritis   . Headache(784.0)   . History of stroke   . Hyperlipidemia   . Insulin dependent diabetes mellitus ([place])    History of diabetic ketoacidosis  . Ischemic cardiomyopathy    LVEF 40-45%  . Peripheral arterial disease ([place])   . Shingles    11/2010  . ST elevation myocardial infarction (STEMI) of anterior wall [facility])    Late presentation September 2014    Medications:  Medications Prior to Admission  Medication Sig Dispense Refill Last Dose  . ALPRAZolam (XANAX) 1 MG tablet Take 1 mg by mouth 3 (three) times daily.    unknown at prn  . apixaban (ELIQUIS) 5 MG TABS tablet Take 5 mg by mouth 2 (two) times daily.   unknown  . gabapentin (NEURONTIN) 300 MG capsule Take 300 mg by mouth at bedtime.     . insulin aspart (NOVOLOG FLEXPEN) 100 UNIT/ML injection Inject 2-10 Units into the skin 3 (three) times daily before meals. Sliding scale as follows 200-250=2 units 251-300=4 units 301-350=6  units 351-400=8 units >400-give 10 units and Notify MD   unknown at prn  . Insulin Glargine (LANTUS SOLOSTAR) 100 UNIT/ML SOPN Inject 15 Units into the skin at bedtime. (Patient taking differently: Inject 25 Units into the skin at bedtime. )   11/16/2016 at Unknown time  . nitroGLYCERIN (NITROSTAT) 0.4 MG SL tablet Place 1 tablet (0.4 mg total) under the tongue every 5 (five) minutes as needed for chest pain. 25 tablet 5 11/16/2016 at prn  . oxyCODONE-acetaminophen (PERCOCET) 10-325 MG tablet Take 1 tablet by mouth 4 (four) times daily. 120 tablet 0 11/16/2016 at prn  . promethazine (PHENERGAN) 12.5 MG tablet Take 12.5 mg by mouth every 4 (four) hours as needed for nausea or vomiting.    11/16/2016 at prn   Scheduled:  . aspirin  324 mg Per Tube Daily  . atorvastatin  80 mg Oral q1800  . carvedilol  3.125 mg Oral BID WC  . cephALEXin  500 mg Per Tube Q12H  . fluconazole  100 mg Per Tube Daily  . heparin  5,000 Units Subcutaneous Q8H  . insulin aspart  0-9 Units Subcutaneous Q4H  . insulin glargine  10 Units Subcutaneous Daily  . mouth rinse  15 mL Mouth Rinse BID    Assessment: 46 yo female with CVA. She is on apixiban for history of lupus anticoagulant with protein C/S deficiency.  Pharmacy consulted  to restart apixaban on 11/8 -Hg= 8.5 (stable) -SCr= 0.66  Goal of Therapy:  Monitor platelets by anticoagulation protocol: Yes   Plan:  -Restart apixiban 5mg  po bid on 11/8 -Consider decreasing aspirin to 81mg /day -Will discontinue heparin after the last dose today -Will sign off. Please contact pharmacy with any other needs.  Thank you Hildred Laser, Pharm D 11/24/2016 10:58 AM

## 2016-11-24 NOTE — Progress Notes (Signed)
Patient will discharge to [facility] [place] Anticipated discharge date: 11/24/16 Family notified: Rockey Situ, daughter Transportation by: Corey Harold  Nurse to call report to [phone]   CSW signing off.  Estanislado Emms, [place]  Clinical Social Worker

## 2016-11-24 NOTE — Evaluation (Signed)
Speech Language Pathology Evaluation Patient Details Name: NIKCOLE EISCHEID MRN: [id number] DOB: 12/01/1970 Today's Date: 11/24/2016 Time: 2878-6767 SLP Time Calculation (min) (ACUTE ONLY): 30 min  Problem List:  Patient Active Problem List   Diagnosis Date Noted  . Cardiomyopathy ([place])   . Pressure injury of skin 11/21/2016  . Diabetic ketoacidosis with coma associated with type 1 diabetes mellitus ([place])   . Palliative care by specialist   . Acute cystitis without hematuria   . AKI (acute kidney injury) ([place])   . Thrombophilia ([place])   . ICAO (internal carotid artery occlusion), left   . Cerebral embolism with cerebral infarction 11/19/2016  . Acute respiratory failure with hypoxia ([place])   . DKA, type 1 ([place]) 11/17/2016  . Metabolic encephalopathy 20/94/7096  . Pancreatitis, acute 11/07/2016  . Abscess of vulva 04/01/2016  . PAD (peripheral artery disease) ([place]) 11/12/2014  . Coronary atherosclerosis of native coronary artery 02/20/2013  . Cardiomyopathy, ischemic 02/20/2013  . History of stroke 04/13/2011  . Thromboembolism of lower extremity artery ([place]) 02/09/2011  . Peripheral arterial disease ([place])   . Hyperlipidemia   . Tobacco abuse   . Insulin dependent diabetes mellitus with complications ([place]) 28/36/6294   Past Medical History:  Past Medical History:  Diagnosis Date  . Anxiety   . Arterial thromboembolism ([place])    Prior embolectomy, previously on Coumadin  . Coronary atherosclerosis of native coronary artery    DES to LAD September 2014  . Depression   . Facial cellulitis    12/2010  . Glomerulonephritis   . Headache(784.0)   . History of stroke   . Hyperlipidemia   . Insulin dependent diabetes mellitus ([place])    History of diabetic ketoacidosis  . Ischemic cardiomyopathy    LVEF 40-45%  . Peripheral arterial disease ([place])   . Shingles    11/2010  . ST elevation myocardial infarction (STEMI) of anterior wall [facility])    Late presentation September 2014    Past Surgical History:  Past Surgical History:  Procedure Laterality Date  . DILATION AND CURETTAGE OF UTERUS    . MULTIPLE TOOTH EXTRACTIONS    . WRIST SURGERY     Left   HPI:  Pt is a 46 yo female at [facility] from 10/20 to 10/27 with acute pancreatitis and delirium. No clear cause of her pancreatitis was found. After discharge home, the family reports that Ms. Yono was not compliant with her diabetes regimen. She didn't have a means to test her blood sugars, and would sporadically use her insulin. She became progressively more short of breath and nauseous over the past 2 days. She was also getting dry heaves. She started getting more confused. She was brought to ER and found to have severe metabolic acidosis with hyperglycemia consistent with DKA. MRI of head reveals Large areas of late acute/early subacute infarction within the left MCA distribution as well as multiple chronic infarcts. Intubated 11/1 to 11/2. Chest x ray without acute cardiopulmonary disease.    Assessment / Plan / Recommendation Clinical Impression  Pt presents with mixed expressive/receptive aphasia; demonstrates ability to repeat simple phrases given MAX verbal cues, participate in automatic speech taks (singing) and approximate gestures with greater than 50% accuracy when given MAX verbal and tactile cueing. Pt responds appropriately (physically) in functional context (reaches for medicine, holds arm out for shot, holds pen); however, is unsuccessful at approximating gestures without MAX tactile cueing and/or functional context. Pt's gestures/speech suggestive of apraxia; inconsistent errors and groping noticed. 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 Perseveration demonstrated by repetition of phrases/words and the action of moving her hand to her face when attempting commands. Cognition unable to be fully assessed secondary to language deficits; however, pt attends to session and  demonstrates emergent awareness. Will continue to follow acutely for facilitation of functional communication.    SLP Assessment  SLP Recommendation/Assessment: Patient needs continued Speech Lanaguage Pathology Services SLP Visit Diagnosis: Frontal lobe and executive function deficit Frontal lobe and executive function deficit following: Cerebral infarction    Follow Up Recommendations  Inpatient Rehab    Frequency and Duration min 2x/week  2 weeks      SLP Evaluation Cognition  Overall Cognitive Status: No family/caregiver present to determine baseline cognitive functioning Arousal/Alertness: Awake/alert Attention: Sustained Sustained Attention: Appears intact Executive Function: Initiating Initiating: Appears intact Behaviors: Perseveration;Lability;Restless       Comprehension  Auditory Comprehension Overall Auditory Comprehension: Impaired Yes/No Questions: Impaired Basic Biographical Questions: 26-50% accurate Basic Immediate Environment Questions: 25-49% accurate Commands: Impaired One Step Basic Commands: 0-24% accurate Conversation: Simple Interfering Components: Attention;Pain EffectiveTechniques: Extra processing time;Repetition;Stressing words;Visual/Gestural cues Visual Recognition/Discrimination Discrimination: Exceptions to [facility] Common Objects: Unable to indentify Reading Comprehension Reading Status: Unable to assess (comment)(Able to read her name; possible apraxia)    Expression Expression Primary Mode of Expression: Verbal Verbal Expression Overall Verbal Expression: Impaired Initiation: No impairment Automatic Speech: Singing;Name Level of Generative/Spontaneous Verbalization: Phrase Repetition: Impaired Level of Impairment: Sentence level Naming: Impairment Confrontation: Impaired Common Objects: Unable to indentify Convergent: 0-24% accurate Verbal Errors: Jargon;Aware of errors;Perseveration Pragmatics: No impairment Effective Techniques:  Semantic cues;Sentence completion;Melodic intonation;Phonemic cues Written Expression Written Expression: Exceptions to [facility] Self Formulation Ability: Letter   Oral / Motor  Oral Motor/Sensory Function Overall Oral Motor/Sensory Function: Mild impairment Facial ROM: Reduced right;Suspected CN VII (facial) dysfunction Facial Symmetry: Abnormal symmetry right;Suspected CN VII (facial) dysfunction Facial Strength: Within Functional Limits Lingual ROM: Within Functional Limits Velum: Within Functional Limits Mandible: Within Functional Limits Motor Speech Overall Motor Speech: Impaired Respiration: Within functional limits Phonation: Normal Resonance: Within functional limits Motor Speech Errors: Inconsistent;Aware   GO                    Aaron Edelman, Student SLP 11/24/2016, 10:48 AM

## 2016-11-24 NOTE — Clinical Social Work Placement (Signed)
   CLINICAL SOCIAL WORK PLACEMENT  NOTE  Date:  11/24/2016  Patient Details  Name: [name] MRN: [id number] Date of Birth: 08-14-1970  Clinical Social Work is seeking post-discharge placement for this patient at the [place] level of care (*CSW will initial, date and re-position this form in  chart as items are completed):  Yes   Patient/family provided with [place] Work Department's list of facilities offering this level of care within the geographic area requested by the patient (or if unable, by the patient's family).  Yes   Patient/family informed of their freedom to choose among providers that offer the needed level of care, that participate in Medicare, Medicaid or managed care program needed by the patient, have an available bed and are willing to accept the patient.  Yes   Patient/family informed of [place]'s ownership interest in [facility] and [facility], as well as of the fact that they are under no obligation to receive care at these facilities.  PASRR submitted to EDS on       PASRR number received on 11/24/16     Existing PASRR number confirmed on       FL2 transmitted to all facilities in geographic area requested by pt/family on 11/24/16     FL2 transmitted to all facilities within larger geographic area on       Patient informed that his/her managed care company has contracts with or will negotiate with certain facilities, including the following:  [facility]     Yes   Patient/family informed of bed offers received.  Patient chooses bed at [facility]     Physician recommends and patient chooses bed at      Patient to be transferred to [facility] [place] on 11/24/16.  Patient to be transferred to facility by [facility]     Patient family notified on 11/24/16 of transfer.  Name of family member notified:        PHYSICIAN Please prepare priority discharge summary, including medications,  Please prepare prescriptions, Please sign FL2     Additional Comment:    _______________________________________________ Estanislado Emms, LCSW 11/24/2016, 2:06 PM

## 2016-11-24 NOTE — Progress Notes (Signed)
  Speech Language Pathology Treatment: Dysphagia  Patient Details Name: [name] MRN: [id number] DOB: 02/22/1970 Today's Date: 11/24/2016 Time: 0941-1000 SLP Time Calculation (min) (ACUTE ONLY): 19 min  Assessment / Plan / Recommendation Clinical Impression  Observed pt for diet check with mixed, regular and nectar-thick liquid consistencies; coughing and throat clearing observed intermittently throughout session and noted difficulty with mixed consistency as observed by discoordination of bolus control and posterior transit. No immediate cough noted with nectar-thick consistencies alone and mild oral residue post swallow following regular texture. Recommend continuation of Dys 3 diet with nectar-thick liquids and meds whole in puree or nectar-thick liquids. Will continue to follow closely to monitor diet tolerance.   HPI HPI: Pt is a 46 yo female at [facility] from 10/20 to 10/27 with acute pancreatitis and delirium. No clear cause of her pancreatitis was found. After discharge home, the family reports that [name] was not compliant with her diabetes regimen. She didn't have a means to test her blood sugars, and would sporadically use her insulin. She became progressively more short of breath and nauseous over the past 2 days. She was also getting dry heaves. She started getting more confused. She was brought to ER and found to have severe metabolic acidosis with hyperglycemia consistent with DKA. MRI of head reveals Large areas of late acute/early subacute infarction within the left MCA distribution as well as multiple chronic infarcts. Intubated 11/1 to 11/2. Chest x ray without acute cardiopulmonary disease.       SLP Plan  Continue with current plan of care  Patient needs continued Speech Lanaguage Pathology Services    Recommendations  Diet recommendations: Dysphagia 3 (mechanical soft);Nectar-thick liquid Liquids provided via: Cup;Straw Medication Administration: Whole meds  with liquid Supervision: Staff to assist with self feeding Compensations: Slow rate;Small sips/bites Postural Changes and/or Swallow Maneuvers: Seated upright 90 degrees                Oral Care Recommendations: Oral care BID Follow up Recommendations: Inpatient Rehab SLP Visit Diagnosis: Frontal lobe and executive function deficit Frontal lobe and executive function deficit following: Cerebral infarction Plan: Continue with current plan of care       GO                Aaron Edelman, Student SLP 11/24/2016, 10:53 AM

## 2016-12-14 ENCOUNTER — Ambulatory Visit (HOSPITAL_COMMUNITY)
Admission: RE | Admit: 2016-12-14 | Discharge: 2016-12-14 | Disposition: A | Discharge status: Home / Self Care | Payer: Medicaid Other | Source: Ambulatory Visit | Attending: Gastroenterology | Admitting: Gastroenterology

## 2016-12-14 ENCOUNTER — Other Ambulatory Visit: Payer: Self-pay | Admitting: Gastroenterology

## 2016-12-14 DIAGNOSIS — K8500 Idiopathic acute pancreatitis without necrosis or infection: Secondary | ICD-10-CM | POA: Diagnosis not present

## 2016-12-14 DIAGNOSIS — J90 Pleural effusion, not elsewhere classified: Secondary | ICD-10-CM | POA: Insufficient documentation

## 2016-12-14 DIAGNOSIS — I700 Atherosclerosis of aorta: Secondary | ICD-10-CM | POA: Insufficient documentation

## 2016-12-14 MED ORDER — GADOBENATE DIMEGLUMINE 529 MG/ML IV SOLN
8.0000 mL | Freq: Once | INTRAVENOUS | Status: AC | PRN
  Administered 2016-12-14: 8 mL via INTRAVENOUS

## 2016-12-15 ENCOUNTER — Encounter: Payer: Self-pay | Admitting: Gastroenterology

## 2016-12-15 NOTE — Progress Notes (Signed)
LMOM to call.

## 2016-12-16 NOTE — Progress Notes (Signed)
LMOM to call. Mailing a letter to call also.  Forwarding to [facility] to make referral when pt knows.

## 2016-12-30 ENCOUNTER — Telehealth: Payer: Self-pay | Admitting: Gastroenterology

## 2016-12-30 DIAGNOSIS — K8500 Idiopathic acute pancreatitis without necrosis or infection: Secondary | ICD-10-CM

## 2016-12-30 NOTE — Telephone Encounter (Signed)
Pt's daughter, Vikki Ports, called to say that she received a letter where DS was unable to reach them by phone regarding results. Please call daughter's number at [phone]

## 2016-12-30 NOTE — Telephone Encounter (Signed)
Referral has been placed to general surgery

## 2016-12-30 NOTE — Addendum Note (Signed)
Addended by: Inge Rise on: 12/30/2016 11:48 AM   Modules accepted: Orders

## 2016-12-30 NOTE — Telephone Encounter (Signed)
I spoke to [name]. She is aware of the results and recommendations by Dr. Oneida Alar. See results under imaging.  OK to refer to surgeon for advise on having a cholecystectomy. Ok to refer in [place]. Her mom resides at [facility].

## 2017-01-04 ENCOUNTER — Ambulatory Visit: Payer: Medicaid Other | Admitting: Gastroenterology

## 2017-01-13 ENCOUNTER — Ambulatory Visit: Payer: Medicaid Other | Admitting: General Surgery

## 2017-01-13 NOTE — Telephone Encounter (Signed)
Claiborne Billings at [facility] called office. Pt didn't show for her appt today. Informed Claiborne Billings that pt's daughter had informed DS that she is now a pt at [facility].

## 2017-02-22 ENCOUNTER — Ambulatory Visit: Payer: Medicaid Other | Admitting: Nutrition

## 2017-07-15 ENCOUNTER — Encounter: Payer: Self-pay | Admitting: Gastroenterology

## 2017-07-28 ENCOUNTER — Other Ambulatory Visit (HOSPITAL_COMMUNITY): Payer: Self-pay | Admitting: Pulmonary Disease

## 2017-07-28 DIAGNOSIS — R109 Unspecified abdominal pain: Secondary | ICD-10-CM

## 2017-08-03 ENCOUNTER — Ambulatory Visit (HOSPITAL_COMMUNITY): Payer: Medicaid Other

## 2017-08-18 ENCOUNTER — Ambulatory Visit (HOSPITAL_COMMUNITY)
Admission: RE | Admit: 2017-08-18 | Discharge: 2017-08-18 | Disposition: A | Discharge status: Home / Self Care | Payer: Medicaid Other | Source: Ambulatory Visit | Attending: Pulmonary Disease | Admitting: Pulmonary Disease

## 2017-08-18 ENCOUNTER — Encounter (HOSPITAL_COMMUNITY): Payer: Self-pay

## 2017-10-13 ENCOUNTER — Telehealth: Payer: Self-pay | Admitting: Gastroenterology

## 2017-10-13 ENCOUNTER — Ambulatory Visit: Payer: Medicaid Other | Admitting: Gastroenterology

## 2017-10-13 ENCOUNTER — Encounter: Payer: Self-pay | Admitting: Gastroenterology

## 2017-10-13 ENCOUNTER — Encounter

## 2017-10-13 NOTE — Telephone Encounter (Signed)
PATIENT WAS A NO SHOW AND LETTER SENT  °

## 2018-08-02 ENCOUNTER — Other Ambulatory Visit: Payer: Self-pay

## 2018-08-02 ENCOUNTER — Encounter (HOSPITAL_COMMUNITY): Payer: Self-pay | Admitting: Emergency Medicine

## 2018-08-02 ENCOUNTER — Emergency Department (HOSPITAL_COMMUNITY)
Admission: EM | Admit: 2018-08-02 | Discharge: 2018-08-03 | Disposition: A | Discharge status: Home / Self Care | Payer: Medicaid Other | Attending: Emergency Medicine | Admitting: Emergency Medicine

## 2018-08-02 DIAGNOSIS — Z79899 Other long term (current) drug therapy: Secondary | ICD-10-CM | POA: Diagnosis not present

## 2018-08-02 DIAGNOSIS — E119 Type 2 diabetes mellitus without complications: Secondary | ICD-10-CM | POA: Insufficient documentation

## 2018-08-02 DIAGNOSIS — R1011 Right upper quadrant pain: Secondary | ICD-10-CM | POA: Diagnosis present

## 2018-08-02 DIAGNOSIS — Z8673 Personal history of transient ischemic attack (TIA), and cerebral infarction without residual deficits: Secondary | ICD-10-CM | POA: Diagnosis not present

## 2018-08-02 DIAGNOSIS — F17210 Nicotine dependence, cigarettes, uncomplicated: Secondary | ICD-10-CM | POA: Insufficient documentation

## 2018-08-02 DIAGNOSIS — N390 Urinary tract infection, site not specified: Secondary | ICD-10-CM

## 2018-08-02 DIAGNOSIS — Z794 Long term (current) use of insulin: Secondary | ICD-10-CM | POA: Insufficient documentation

## 2018-08-02 LAB — CBC
HCT: 54.2 % — ABNORMAL HIGH (ref 36.0–46.0)
Hemoglobin: 16.7 g/dL — ABNORMAL HIGH (ref 12.0–15.0)
MCH: 29.5 pg (ref 26.0–34.0)
MCHC: 30.8 g/dL (ref 30.0–36.0)
MCV: 95.6 fL (ref 80.0–100.0)
Platelets: 356 10*3/uL (ref 150–400)
RBC: 5.67 MIL/uL — ABNORMAL HIGH (ref 3.87–5.11)
RDW: 13.3 % (ref 11.5–15.5)
WBC: 9 10*3/uL (ref 4.0–10.5)
nRBC: 0 % (ref 0.0–0.2)

## 2018-08-02 MED ORDER — SODIUM CHLORIDE 0.9% FLUSH
3.0000 mL | Freq: Once | INTRAVENOUS | Status: AC
  Administered 2018-08-03: 3 mL via INTRAVENOUS

## 2018-08-02 NOTE — ED Notes (Addendum)
Patient very emotional. Patient states she does not know how to express what's wrong with her physically due to having a stroke. States her stomach hurts over her gallbladder region but could not describe pain. Pt stated she has scared to be in the hospital because her mom just died of Dementia. Used therapeutic communication.

## 2018-08-02 NOTE — ED Triage Notes (Signed)
Patient states she is having abdominal pain x 2 days with nausea and vomiting.

## 2018-08-02 NOTE — ED Notes (Signed)
Unable to obtain IV at this time. Notified Janett Billow, Therapist, sports.

## 2018-08-03 ENCOUNTER — Emergency Department (HOSPITAL_COMMUNITY): Payer: Medicaid Other

## 2018-08-03 LAB — COMPREHENSIVE METABOLIC PANEL
ALT: 18 U/L (ref 0–44)
AST: 18 U/L (ref 15–41)
Albumin: 2.9 g/dL — ABNORMAL LOW (ref 3.5–5.0)
Alkaline Phosphatase: 92 U/L (ref 38–126)
Anion gap: 9 (ref 5–15)
BUN: 25 mg/dL — ABNORMAL HIGH (ref 6–20)
CO2: 23 mmol/L (ref 22–32)
Calcium: 8.6 mg/dL — ABNORMAL LOW (ref 8.9–10.3)
Chloride: 104 mmol/L (ref 98–111)
Creatinine, Ser: 1.63 mg/dL — ABNORMAL HIGH (ref 0.44–1.00)
GFR calc Af Amer: 42 mL/min — ABNORMAL LOW (ref >60)
GFR calc non Af Amer: 37 mL/min — ABNORMAL LOW (ref >60)
Glucose, Bld: 204 mg/dL — ABNORMAL HIGH (ref 70–99)
Potassium: 4.2 mmol/L (ref 3.5–5.1)
Sodium: 136 mmol/L (ref 135–145)
Total Bilirubin: 0.8 mg/dL (ref 0.3–1.2)
Total Protein: 6.1 g/dL — ABNORMAL LOW (ref 6.5–8.1)

## 2018-08-03 LAB — URINALYSIS, ROUTINE W REFLEX MICROSCOPIC
Bilirubin Urine: NEGATIVE
Glucose, UA: >=500 mg/dL — AB
Ketones, ur: NEGATIVE mg/dL
Leukocytes,Ua: NEGATIVE
Nitrite: NEGATIVE
Protein, ur: >=300 mg/dL — AB
Specific Gravity, Urine: 1.037 — ABNORMAL HIGH (ref 1.005–1.030)
WBC, UA: >50 WBC/hpf — ABNORMAL HIGH (ref 0–5)
pH: 5 (ref 5.0–8.0)

## 2018-08-03 LAB — LIPASE, BLOOD: Lipase: 18 U/L (ref 11–51)

## 2018-08-03 MED ORDER — PANTOPRAZOLE SODIUM 40 MG PO TBEC: 40.0000 mg | DELAYED_RELEASE_TABLET | Freq: Every day | ORAL | 3 refills | Status: DC

## 2018-08-03 MED ORDER — ONDANSETRON HCL 4 MG/2ML IJ SOLN
4.0000 mg | Freq: Once | INTRAMUSCULAR | Status: AC
  Administered 2018-08-03: 4 mg via INTRAVENOUS
  Filled 2018-08-03: qty 2

## 2018-08-03 MED ORDER — ONDANSETRON HCL 4 MG PO TABS: 4.0000 mg | ORAL_TABLET | Freq: Four times a day (QID) | ORAL | 0 refills | Status: DC | PRN

## 2018-08-03 MED ORDER — MORPHINE SULFATE (PF) 4 MG/ML IV SOLN
4.0000 mg | Freq: Once | INTRAVENOUS | Status: AC
  Administered 2018-08-03: 4 mg via INTRAVENOUS
  Filled 2018-08-03: qty 1

## 2018-08-03 MED ORDER — IOHEXOL 300 MG/ML  SOLN
80.0000 mL | Freq: Once | INTRAMUSCULAR | Status: AC | PRN
  Administered 2018-08-03: 80 mL via INTRAVENOUS

## 2018-08-03 MED ORDER — IOHEXOL 300 MG/ML  SOLN
30.0000 mL | Freq: Once | INTRAMUSCULAR | Status: AC | PRN
  Administered 2018-08-03: 30 mL via ORAL

## 2018-08-03 MED ORDER — SODIUM CHLORIDE 0.9 % IV BOLUS
500.0000 mL | Freq: Once | INTRAVENOUS | Status: AC
Start: 2018-08-03 — End: 2018-08-03
  Administered 2018-08-03: 500 mL via INTRAVENOUS

## 2018-08-03 MED ORDER — SODIUM CHLORIDE 0.9 % IV SOLN
1.0000 g | Freq: Once | INTRAVENOUS | Status: AC
  Administered 2018-08-03: 1 g via INTRAVENOUS
  Filled 2018-08-03: qty 10

## 2018-08-03 MED ORDER — CEPHALEXIN 500 MG PO CAPS: 500.0000 mg | ORAL_CAPSULE | Freq: Two times a day (BID) | ORAL | 0 refills | Status: DC

## 2018-08-03 NOTE — ED Notes (Signed)
Patient transported to CT 

## 2018-08-03 NOTE — ED Provider Notes (Signed)
[facility] EMERGENCY DEPARTMENT Provider Note   CSN: [id number] Arrival date & time: 08/02/18  1846    History   Chief Complaint Chief Complaint  Patient presents with   Abdominal Pain    nausea vomiting    HPI [name] is a 48 y.o. female.     Patient presents to the emergency department for evaluation of abdominal pain.  Patient reports several days of right-sided abdominal pain.  Pain radiates into her back.  She cannot describe the pain.  She has had associated nausea and vomiting.  Nothing makes it better or worse.  No chest pain or difficulty breathing.     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[place])    Prior embolectomy, previously on Coumadin   Coronary atherosclerosis of native coronary artery    DES to LAD September 2014   Depression    Facial cellulitis    12/2010   Glomerulonephritis    Headache(784.0)    History of stroke    Hyperlipidemia    Insulin dependent diabetes mellitus ([place])    History of diabetic ketoacidosis   Ischemic cardiomyopathy    LVEF 40-45%   Peripheral arterial disease ([place])    Shingles    11/2010   ST elevation myocardial infarction (STEMI) of anterior wall [facility])    Late presentation September 2014    Patient Active Problem List   Diagnosis Date Noted   Cardiomyopathy ([place])    Pressure injury of skin 11/21/2016   Diabetic ketoacidosis with coma associated with type 1 diabetes mellitus ([place])    Palliative care by specialist    Acute cystitis without hematuria    AKI (acute kidney injury) (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[place]) 02/09/2011   Peripheral arterial disease ([place])    Hyperlipidemia    Tobacco abuse    Insulin dependent diabetes mellitus with complications ([place]) 32/12/2480    Past Surgical History:  Procedure Laterality Date   AMPUTATION  03/03/2011   Procedure: AMPUTATION DIGIT;  Surgeon: Newt Minion, MD;  Location: [place];  Service: Orthopedics;  Laterality: Left;  Left Foot Amputation 4th and 5th toes at MTP joint   AMPUTATION  04/22/2011   Procedure: AMPUTATION BELOW KNEE;  Surgeon: Newt Minion, MD;  Location: [place];  Service: Orthopedics;  Laterality: Left;  Left Below Knee Amputation   DILATION AND CURETTAGE OF UTERUS     EMBOLECTOMY  01/29/2011   Procedure: EMBOLECTOMY;  Surgeon: Mal Misty, MD;  Location: [place];  Service: Vascular;  Laterality: Left;  Left Popliteal and Tibial Embolectomy with patch angioplasty   INCISION AND DRAINAGE ABSCESS N/A 04/06/2016   Procedure: INCISION AND DRAINAGE ABSCESS;  Surgeon: Jonnie Kind, MD;  Location: [facility];  Service: Gynecology;  Laterality: N/A;   LEFT HEART CATHETERIZATION WITH CORONARY ANGIOGRAM N/A 10/01/2012   Procedure: LEFT HEART CATHETERIZATION WITH CORONARY ANGIOGRAM;  Surgeon: Peter M Martinique, MD;  Location: [facility] CATH LAB;  Service: Cardiovascular;  Laterality: N/A;   LOWER EXTREMITY ANGIOGRAM N/A 01/27/2011   Procedure: LOWER EXTREMITY ANGIOGRAM;  Surgeon: Rosetta Posner, MD;  Location: [facility] CATH LAB;  Service: Cardiovascular;  Laterality: N/A;   LOWER EXTREMITY ANGIOGRAM N/A 01/28/2011   Procedure: LOWER EXTREMITY ANGIOGRAM;  Surgeon: Conrad [place], MD;  Location: [facility] CATH LAB;  Service: Cardiovascular;  Laterality: N/A;   LOWER EXTREMITY ANGIOGRAM Left 01/29/2011   Procedure: LOWER EXTREMITY ANGIOGRAM;  Surgeon: Mal Misty, MD;  Location:  [facility] CATH LAB;  Service: Cardiovascular;  Laterality: Left;   MULTIPLE TOOTH EXTRACTIONS     TEE WITHOUT CARDIOVERSION  04/15/2011   Procedure: TRANSESOPHAGEAL ECHOCARDIOGRAM (TEE);  Surgeon: [name], MD;  [facility];  Service: Cardiovascular;  Laterality: N/A;   WRIST SURGERY     Left     OB History    Gravida  1   Para  1   Term      Preterm  1   AB      Living  1     SAB      TAB      Ectopic      Multiple      Live Births               Home Medications    Prior to Admission medications   Medication Sig Start Date End Date Taking? Authorizing Provider  ALPRAZolam Duanne Moron) 1 MG tablet Take 0.5 tablets (0.5 mg total) 3 (three) times daily as needed by mouth for anxiety. 11/24/16   Regalado, Belkys A, MD  apixaban (ELIQUIS) 5 MG TABS tablet Take 1 tablet (5 mg total) 2 (two) times daily by mouth. 11/25/16   Regalado, Belkys A, MD  atorvastatin (LIPITOR) 80 MG tablet Take 1 tablet (80 mg total) daily at 6 PM by mouth. 11/24/16   Regalado, Belkys A, MD  carvedilol (COREG) 3.125 MG tablet Take 1 tablet (3.125 mg total) 2 (two) times daily with a meal by mouth. 11/24/16   Regalado, Belkys A, MD  cephALEXin (KEFLEX) 500 MG capsule Take 1 capsule (500 mg total) by mouth 2 (two) times daily. 08/03/18   Orpah Greek, MD  gabapentin (NEURONTIN) 300 MG capsule Take 300 mg by mouth at bedtime.    [provider]  insulin aspart (NOVOLOG FLEXPEN) 100 UNIT/ML injection Inject 2-10 Units into the skin 3 (three) times daily before meals. Sliding scale as follows 200-250=2 units 251-300=4 units 301-350=6 units 351-400=8 units >400-give 10 units and Notify MD    [provider]  Insulin Glargine (LANTUS SOLOSTAR) 100 UNIT/ML Solostar Pen Inject 10 Units at bedtime into the skin. 11/24/16   Regalado, Cassie Freer, MD  Maltodextrin-Xanthan Gum (RESOURCE THICKENUP CLEAR) POWD Take 120 g as needed by mouth. 11/24/16   Regalado, Belkys A, MD    nitroGLYCERIN (NITROSTAT) 0.4 MG SL tablet Place 1 tablet (0.4 mg total) under the tongue every 5 (five) minutes as needed for chest pain. 10/30/12   Lendon Colonel, NP  ondansetron (ZOFRAN) 4 MG tablet Take 1 tablet (4 mg total) by mouth every 6 (six) hours as needed for nausea or vomiting. 08/03/18   Cristin Szatkowski, Gwenyth Allegra, MD  oxyCODONE-acetaminophen (PERCOCET) 10-325 MG tablet Take 0.5 tablets every 4 (four) hours as needed by mouth for pain. 11/24/16   Regalado, Belkys A, MD  pantoprazole (PROTONIX) 40 MG tablet Take 1 tablet (40 mg total) by mouth daily. 08/03/18   Orpah Greek, MD  Family History Family History  Problem Relation Age of Onset   COPD Mother        alive - 42   Hypertension Mother    Diabetes Mother    Stroke Mother    Coronary artery disease Mother    Diabetes Father        alive - 75   Hypertension Father    Coronary artery disease Father    Anesthesia problems Neg Hx     Social History Social History   Tobacco Use   Smoking status: Current Every Day Smoker    Packs/day: 0.50    Years: 21.00    Pack years: 10.50    Types: Cigarettes    Start date: 07/06/1989    Last attempt to quit: 01/30/2011    Years since quitting: 7.5   Smokeless tobacco: Never Used  Substance Use Topics   Alcohol use: No    Alcohol/week: 0.0 standard drinks    Comment: rarely   Drug use: Yes    Frequency: 1.0 times per week    Types: Marijuana    Comment: Former     Allergies   Ibuprofen and Losartan   Review of Systems Review of Systems  Gastrointestinal: Positive for abdominal pain.  All other systems reviewed and are negative.    Physical Exam Updated Vital Signs BP 128/73    Pulse 83    Temp 98 F (36.7 C) (Oral)    Resp 18    Ht 5\' 1"  (1.549 m)    Wt 45.4 kg    LMP 01/12/2011    SpO2 97%    BMI 18.89 kg/m   Physical Exam Vitals signs and nursing note reviewed.  Constitutional:      General: She is not in acute distress.     Appearance: Normal appearance. She is well-developed.  HENT:     Head: Normocephalic and atraumatic.     Right Ear: Hearing normal.     Left Ear: Hearing normal.     Nose: Nose normal.  Eyes:     Conjunctiva/sclera: Conjunctivae normal.     Pupils: Pupils are equal, round, and reactive to light.  Neck:     Musculoskeletal: Normal range of motion and neck supple.  Cardiovascular:     Rate and Rhythm: Regular rhythm.     Heart sounds: S1 normal and S2 normal. No murmur. No friction rub. No gallop.   Pulmonary:     Effort: Pulmonary effort is normal. No respiratory distress.     Breath sounds: Normal breath sounds.  Chest:     Chest wall: No tenderness.  Abdominal:     General: Bowel sounds are normal.     Palpations: Abdomen is soft.     Tenderness: There is abdominal tenderness in the right upper quadrant and epigastric area. There is no guarding or rebound. Negative signs include Murphy's sign and McBurney's sign.     Hernia: No hernia is present.    Musculoskeletal: Normal range of motion.  Skin:    General: Skin is warm and dry.     Findings: No rash.  Neurological:     Mental Status: She is alert and oriented to person, place, and time.     GCS: GCS eye subscore is 4. GCS verbal subscore is 5. GCS motor subscore is 6.     Cranial Nerves: No cranial nerve deficit.     Sensory: No sensory deficit.     Coordination: Coordination normal.  Psychiatric:  Speech: Speech normal.        Behavior: Behavior normal.        Thought Content: Thought content normal.      ED Treatments / Results  Labs (all labs ordered are listed, but only abnormal results are displayed) Labs Reviewed  COMPREHENSIVE METABOLIC PANEL - Abnormal; Notable for the following components:      Result Value   Glucose, Bld 204 (*)    BUN 25 (*)    Creatinine, Ser 1.63 (*)    Calcium 8.6 (*)    Total Protein 6.1 (*)    Albumin 2.9 (*)    GFR calc non Af Amer 37 (*)    GFR calc Af Amer 42 (*)      All other components within normal limits  CBC - Abnormal; Notable for the following components:   RBC 5.67 (*)    Hemoglobin 16.7 (*)    HCT 54.2 (*)    All other components within normal limits  URINALYSIS, ROUTINE W REFLEX MICROSCOPIC - Abnormal; Notable for the following components:   APPearance CLOUDY (*)    Specific Gravity, Urine 1.037 (*)    Glucose, UA >=500 (*)    Hgb urine dipstick SMALL (*)    Protein, ur >=300 (*)    WBC, UA >50 (*)    Bacteria, UA MANY (*)    All other components within normal limits  URINE CULTURE  LIPASE, BLOOD    EKG None  Radiology Ct Abdomen Pelvis W Contrast  Result Date: 08/03/2018 CLINICAL DATA:  Right upper quadrant abdominal pain with nausea and vomiting EXAM: CT ABDOMEN AND PELVIS WITH CONTRAST TECHNIQUE: Multidetector CT imaging of the abdomen and pelvis was performed using the standard protocol following bolus administration of intravenous contrast. CONTRAST:  26mL OMNIPAQUE IOHEXOL 300 MG/ML SOLN, 80mL OMNIPAQUE IOHEXOL 300 MG/ML SOLN COMPARISON:  CT 11/06/2016, 08/11/2010, MRI 12/14/2016 FINDINGS: Lower chest: Lung bases demonstrate no acute consolidation or effusion. The heart size is normal. Hepatobiliary: No focal liver abnormality is seen. No gallstones, gallbladder wall thickening, or biliary dilatation. Pancreas: Unremarkable. No pancreatic ductal dilatation or surrounding inflammatory changes. Spleen: Normal in size without focal abnormality. Adrenals/Urinary Tract: Adrenal glands are normal. No hydronephrosis. Cyst within the upper pole of the right kidney. The bladder contains a small amount of air. Stomach/Bowel: Stomach is within normal limits. Appendix appears normal. No evidence of bowel wall thickening, distention, or inflammatory changes. Vascular/Lymphatic: Moderate to marked aortic atherosclerosis. No aneurysm. No significantly enlarged lymph nodes. Reproductive: Uterus and bilateral adnexa are unremarkable. Other: Negative  for free air or free fluid. Musculoskeletal: No acute or suspicious osseous abnormality IMPRESSION: 1. No CT evidence for acute intra-abdominal or pelvic abnormality. 2. Small amount of air in the urinary bladder presumably related to recent instrumentation. Electronically Signed   By: Donavan Foil M.D.   On: 08/03/2018 01:51    Procedures Procedures (including critical care time)  Medications Ordered in ED Medications  sodium chloride flush (NS) 0.9 % injection 3 mL (3 mLs Intravenous Given 08/03/18 0051)  sodium chloride 0.9 % bolus 500 mL (0 mLs Intravenous Stopped 08/03/18 0241)  iohexol (OMNIPAQUE) 300 MG/ML solution 30 mL (30 mLs Oral Contrast Given 08/03/18 0116)  iohexol (OMNIPAQUE) 300 MG/ML solution 80 mL (80 mLs Intravenous Contrast Given 08/03/18 0116)  morphine 4 MG/ML injection 4 mg (4 mg Intravenous Given 08/03/18 0057)  ondansetron (ZOFRAN) injection 4 mg (4 mg Intravenous Given 08/03/18 0057)  cefTRIAXone (ROCEPHIN) 1 g in sodium chloride 0.9 %  100 mL IVPB (0 g Intravenous Stopped 08/03/18 0445)     Initial Impression / Assessment and Plan / ED Course  I have reviewed the triage vital signs and the nursing notes.  Pertinent labs & imaging results that were available during my care of the patient were reviewed by me and considered in my medical decision making (see chart for details).        Patient presents to the emergency department for evaluation of abdominal pain.  She has had nausea, vomiting and right-sided abdominal pain for the last several days.  Examination revealed tenderness but no guarding or rebound.  Negative Murphy sign.  Patient did not appear to be in any significant distress.  Lab work was unremarkable.  She does have a history of pancreatitis that was labeled idiopathic in the past.  Her lipase is normal.  She is not experiencing left upper abdominal pain.  She does have a history of diabetic ketoacidosis and poorly controlled diabetes.  Glucose is 204 today,  no acidosis.  BUN is elevated above her normal creatinine of 1.63.  She likely is exhibiting some element of dehydration.  I was cautious with IV fluids, however as she does have an ejection fraction of 30 to 35%.  She was treated with morphine and antiemetics.  Patient underwent CT scan of abdomen and pelvis to further evaluate her abdominal pain.  No acute pathology is noted.  Urinalysis finally did return and does show signs of infection.  No CVA tenderness, no fever, no leukocytosis.  Doubt pyelonephritis.  Patient no longer having nausea and vomiting after treatment here in the ER.  Patient administered IV Rocephin and will be discharged with antiemetic and Keflex, culture pending.  Final Clinical Impressions(s) / ED Diagnoses   Final diagnoses:  Right upper quadrant abdominal pain  Lower urinary tract infectious disease    ED Discharge Orders         Ordered    cephALEXin (KEFLEX) 500 MG capsule  2 times daily     08/03/18 0510    ondansetron (ZOFRAN) 4 MG tablet  Every 6 hours PRN     08/03/18 0510    pantoprazole (PROTONIX) 40 MG tablet  Daily     08/03/18 0510           Orpah Greek, MD 08/03/18 [phone]

## 2018-08-03 NOTE — ED Notes (Signed)
Upon entering room, patient tearful. Asked patient if she felt the need to go to restroom at this time. Patient states that she does not have the need to urinate at this time. States that she needs pain medication at this time. Will check with RN

## 2018-08-05 LAB — URINE CULTURE: Culture: >=100000 — AB

## 2018-08-06 ENCOUNTER — Telehealth: Payer: Self-pay | Admitting: Emergency Medicine

## 2018-08-06 NOTE — Telephone Encounter (Signed)
Post ED Visit - Positive Culture Follow-up  Culture report reviewed by antimicrobial stewardship pharmacist: [facility] Team []  Elenor Quinones, Pharm.D. []  Heide Guile, Pharm.D., BCPS AQ-ID []  Parks Neptune, Pharm.D., BCPS []  Alycia Rossetti, Pharm.D., BCPS []  [place], Pharm.D., BCPS, AAHIVP []  Legrand Como, Pharm.D., BCPS, AAHIVP []  Salome Arnt, PharmD, BCPS []  Johnnette Gourd, PharmD, BCPS []  Hughes Better, PharmD, BCPS [x]  Gorden Harms, PharmD []  Laqueta Linden, PharmD, BCPS []  Albertina Parr, PharmD  [facility] Team []  Leodis Sias, PharmD []  Lindell Spar, PharmD []  Royetta Asal, PharmD []  Graylin Shiver, Rph []  Rema Fendt) Glennon Mac, PharmD []  Arlyn Dunning, PharmD []  Netta Cedars, PharmD []  Dia Sitter, PharmD []  Leone Haven, PharmD []  Gretta Arab, PharmD []  Theodis Shove, PharmD []  Peggyann Juba, PharmD []  Reuel Boom, PharmD   Positive urine culture Treated with Cephalexin, organism sensitive to the same and no further patient follow-up is required at this time.  Sandi Raveling Jeralyn Nolden 08/06/2018, 2:18 PM

## 2018-12-06 DIAGNOSIS — Z89512 Acquired absence of left leg below knee: Secondary | ICD-10-CM | POA: Insufficient documentation

## 2018-12-06 DIAGNOSIS — Z89612 Acquired absence of left leg above knee: Secondary | ICD-10-CM | POA: Insufficient documentation

## 2018-12-06 DIAGNOSIS — G8191 Hemiplegia, unspecified affecting right dominant side: Secondary | ICD-10-CM

## 2018-12-06 DIAGNOSIS — E1151 Type 2 diabetes mellitus with diabetic peripheral angiopathy without gangrene: Secondary | ICD-10-CM | POA: Insufficient documentation

## 2019-01-22 ENCOUNTER — Other Ambulatory Visit: Payer: Self-pay

## 2019-01-22 ENCOUNTER — Encounter: Payer: Self-pay | Admitting: Family Medicine

## 2019-01-22 ENCOUNTER — Ambulatory Visit (INDEPENDENT_AMBULATORY_CARE_PROVIDER_SITE_OTHER): Payer: Medicaid Other | Admitting: Family Medicine

## 2019-01-22 VITALS — BP 119/70 | HR 86 | Temp 98.1°F | Ht 63.0 in | Wt 114.2 lb

## 2019-01-22 DIAGNOSIS — J449 Chronic obstructive pulmonary disease, unspecified: Secondary | ICD-10-CM | POA: Insufficient documentation

## 2019-01-22 DIAGNOSIS — Z794 Long term (current) use of insulin: Secondary | ICD-10-CM

## 2019-01-22 DIAGNOSIS — R319 Hematuria, unspecified: Secondary | ICD-10-CM | POA: Diagnosis not present

## 2019-01-22 DIAGNOSIS — N390 Urinary tract infection, site not specified: Secondary | ICD-10-CM | POA: Diagnosis not present

## 2019-01-22 DIAGNOSIS — I739 Peripheral vascular disease, unspecified: Secondary | ICD-10-CM | POA: Diagnosis not present

## 2019-01-22 DIAGNOSIS — I429 Cardiomyopathy, unspecified: Secondary | ICD-10-CM

## 2019-01-22 DIAGNOSIS — I2510 Atherosclerotic heart disease of native coronary artery without angina pectoris: Secondary | ICD-10-CM

## 2019-01-22 DIAGNOSIS — E1151 Type 2 diabetes mellitus with diabetic peripheral angiopathy without gangrene: Secondary | ICD-10-CM

## 2019-01-22 DIAGNOSIS — E782 Mixed hyperlipidemia: Secondary | ICD-10-CM | POA: Diagnosis not present

## 2019-01-22 DIAGNOSIS — Z720 Tobacco use: Secondary | ICD-10-CM

## 2019-01-22 DIAGNOSIS — R1011 Right upper quadrant pain: Secondary | ICD-10-CM

## 2019-01-22 LAB — POCT URINALYSIS DIP (CLINITEK)
Bilirubin, UA: NEGATIVE
Glucose, UA: 500 mg/dL — AB
Ketones, POC UA: NEGATIVE mg/dL
Leukocytes, UA: NEGATIVE
Nitrite, UA: POSITIVE — AB
POC PROTEIN,UA: >=300 — AB
Spec Grav, UA: >=1.03 — AB (ref 1.010–1.025)
Urobilinogen, UA: 0.2 E.U./dL
pH, UA: 6 (ref 5.0–8.0)

## 2019-01-22 LAB — POCT GLYCOSYLATED HEMOGLOBIN (HGB A1C): Hemoglobin A1C: 11.2 % — AB (ref 4.0–5.6)

## 2019-01-22 MED ORDER — AMOXICILLIN 500 MG PO CAPS: 500.0000 mg | ORAL_CAPSULE | Freq: Two times a day (BID) | ORAL | 0 refills | Status: DC

## 2019-01-22 MED ORDER — PANTOPRAZOLE SODIUM 40 MG PO TBEC: 40.0000 mg | DELAYED_RELEASE_TABLET | Freq: Every day | ORAL | 3 refills | Status: DC

## 2019-01-22 NOTE — Progress Notes (Signed)
New Patient Office Visit  Subjective:  Patient ID: [name], female    DOB: 1970-02-20  Age: 49 y.o. MRN: [id number]  CC:  Chief Complaint  Patient presents with  . Establish Care  . Right Upper Side & absominal pain    HPI [name] presents for pain management-previously taking Percocet-now out of medication as she has no ID and was declined by pain management due to no ID-waiting on photo ID Anxiety-xanax-weaned off 6 months-no current medication for anxiety-daughter states improved when on pain medication-now off both Blood clots caused amputation-left leg-8 years-taking Eliquis DM-poor control-greater than 200 fasting-h/o of infections-I and D-GYN, no recent eye exam Hyperlipidemia-Lipitor-no recent labwork for baseline lft or lipid CVD-Lantus--15units at night, Novolog-sliding scale -200 fasting-h/o of stroke and MI-per daughter poor control on DM prior to stroke-now lives with daughter Continued to smoke-COPD noted on old records review-pt states no inhalers needed Past Medical History:  Diagnosis Date  . Acquired absence of left leg below knee (HCC)   . Anxiety   . 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 Coronary atherosclerosis of native coronary artery    DES to LAD September 2014  . Depression   . Disorder of teeth and supporting structures, unspecified   . Facial cellulitis    12/2010  . Generalized edema   . Glomerulonephritis   . Headache(784.0)   . Hemiplegia, unspecified affecting right dominant side ([place])   . History of stroke   . Hyperlipidemia   . Insulin dependent diabetes mellitus    History of diabetic ketoacidosis  . Ischemic cardiomyopathy    LVEF 40-45%  . Major depressive disorder, single episode, unspecified   . Peripheral arterial disease ([place])   . Shingles    11/2010  . ST elevation myocardial infarction (STEMI) of anterior wall [facility])    Late presentation September 2014  . Type 2 diabetes  mellitus with diabetic peripheral angiopathy without gangrene [facility])     Past Surgical History:  Procedure Laterality Date  . AMPUTATION  03/03/2011   Procedure: AMPUTATION DIGIT;  Surgeon: Newt Minion, MD;  Location: [employer];  Service: Orthopedics;  Laterality: Left;  Left Foot Amputation 4th and 5th toes at MTP joint  . AMPUTATION  04/22/2011   Procedure: AMPUTATION BELOW KNEE;  Surgeon: Newt Minion, MD;  Location: [place];  Service: Orthopedics;  Laterality: Left;  Left Below Knee Amputation  . DILATION AND CURETTAGE OF UTERUS    . EMBOLECTOMY  01/29/2011   Procedure: EMBOLECTOMY;  Surgeon: Mal Misty, MD;  Location: [facility] OR;  Service: Vascular;  Laterality: Left;  Left Popliteal and Tibial Embolectomy with patch angioplasty  . INCISION AND DRAINAGE ABSCESS N/A 04/06/2016   Procedure: INCISION AND DRAINAGE ABSCESS;  Surgeon: Jonnie Kind, MD;  Location: [facility];  Service: Gynecology;  Laterality: N/A;  . LEFT HEART CATHETERIZATION WITH CORONARY ANGIOGRAM N/A 10/01/2012   Procedure: LEFT HEART CATHETERIZATION WITH CORONARY ANGIOGRAM;  Surgeon: Peter M Martinique, MD;  Location: [facility] CATH LAB;  Service: Cardiovascular;  Laterality: N/A;  . LOWER EXTREMITY ANGIOGRAM N/A 01/27/2011   Procedure: LOWER EXTREMITY ANGIOGRAM;  Surgeon: Rosetta Posner, MD;  Location: [facility] CATH LAB;  Service: Cardiovascular;  Laterality: N/A;  . LOWER EXTREMITY ANGIOGRAM N/A 01/28/2011   Procedure: LOWER EXTREMITY ANGIOGRAM;  Surgeon: Conrad [place], MD;  Location: [facility] CATH LAB;  Service: Cardiovascular;  Laterality: N/A;  . LOWER EXTREMITY ANGIOGRAM Left 01/29/2011   Procedure:  LOWER EXTREMITY ANGIOGRAM;  Surgeon: Mal Misty, MD;  Location: [facility] CATH LAB;  Service: Cardiovascular;  Laterality: Left;  Marland Kitchen MULTIPLE TOOTH EXTRACTIONS    . TEE WITHOUT CARDIOVERSION  04/15/2011   Procedure: TRANSESOPHAGEAL ECHOCARDIOGRAM (TEE);  Surgeon: [name], MD;  Location[facility];  Service: Cardiovascular;  Laterality: N/A;  . WRIST  SURGERY     Left    Family History  Problem Relation Age of Onset  . COPD Mother        alive - 28  . Hypertension Mother   . Diabetes Mother   . Stroke Mother   . Coronary artery disease Mother   . Diabetes Father        alive - 49  . Hypertension Father   . Coronary artery disease Father   . Anesthesia problems Neg Hx     Social History   Socioeconomic History  . Marital status: Single    Spouse name: Not on file  . Number of children: Not on file  . Years of education: Not on file  . Highest education level: Not on file  Occupational History  . Not on file  Tobacco Use  . Smoking status: Current Every Day Smoker    Packs/day: 0.50    Years: 21.00    Pack years: 10.50    Types: Cigarettes    Start date: 07/06/1989    Last attempt to quit: 01/30/2011    Years since quitting: 7.9  . Smokeless tobacco: Never Used  Substance and Sexual Activity  . Alcohol use: No    Alcohol/week: 0.0 standard drinks    Comment: rarely  . Drug use: Yes    Frequency: 1.0 times per week    Types: Marijuana    Comment: Former  . Sexual activity: Yes    Partners: Male    Comment: pt stated that her tubal was only 50% effective- "not cut and burned"  Other Topics Concern  . Not on file  Social History Narrative   Unemployed.  Lives in [place] with [place], [place], and mother.  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   Social Determinants of Health   Financial Resource Strain:   . Difficulty of Paying Living Expenses: Not on file  Food Insecurity:   . Worried About Charity fundraiser in the Last Year: Not on file  . Ran Out of Food in the Last Year: Not on file  Transportation Needs:   . Lack of Transportation (Medical): Not on file  . Lack of Transportation (Non-Medical): Not on file  Physical Activity:   . Days of Exercise per Week: Not on file  . Minutes of Exercise per Session: Not on file  Stress:   . Feeling of Stress : Not on file  Social Connections:   .  Frequency of Communication with Friends and Family: Not on file  . Frequency of Social Gatherings with Friends and Family: Not on file  . Attends Religious Services: Not on file  . Active Member of Clubs or Organizations: Not on file  . Attends Archivist Meetings: Not on file  . Marital Status: Not on file  Intimate Partner Violence:   . Fear of Current or Ex-Partner: Not on file  . Emotionally Abused: Not on file  . Physically Abused: Not on file  . Sexually Abused: Not on file    ROS Review of Systems  Constitutional: Positive for appetite change and fatigue.  HENT: Negative.  Eyes:       No recent eye exam  Respiratory: Negative.   Cardiovascular:       Stent placed after MI Cardio myopathy  Gastrointestinal: Positive for nausea.  Endocrine: Positive for polyuria.       Dm-diagnosis 49yo  Genitourinary: Positive for frequency. Negative for dysuria and hematuria.  Musculoskeletal: Positive for arthralgias and myalgias.  Skin: Negative.   Allergic/Immunologic: Negative.   Neurological: Positive for speech difficulty.  Hematological: Negative.   Psychiatric/Behavioral: Positive for confusion. The patient is nervous/anxious.     Objective:   Today's Vitals: BP 119/70 (BP Location: Left Arm, Patient Position: Sitting, Cuff Size: Normal)   Pulse 86   Temp 98.1 F (36.7 C) (Oral)   Ht 5\' 3"  (1.6 m)   Wt 114 lb 3.2 oz (51.8 kg)   LMP 01/12/2011   SpO2 96%   BMI 20.23 kg/m   Physical Exam Vitals reviewed.  Constitutional:      General: She is in acute distress.  HENT:     Head: Normocephalic.  Cardiovascular:     Rate and Rhythm: Normal rate and regular rhythm.     Pulses: Normal pulses.     Heart sounds: Normal heart sounds.  Pulmonary:     Effort: Pulmonary effort is normal.     Breath sounds: Normal breath sounds.  Abdominal:     Palpations: Abdomen is soft.     Tenderness: There is abdominal tenderness.     Comments: Right upper quad   Neurological:     Mental Status: She is alert.   amputation with artificial leg-left BKA-artifical leg  Assessment & Plan:  1. Type 2 diabetes mellitus with diabetic peripheral angiopathy without gangrene, with long-term current use of insulin (HCC) Currently taking 15units lantus and sliding scale insulin with meals-currently poor control-no recent evaluation by endo or nutrition-now lives with daughter with improved compliance. No recent eye exam. No recent blood work-A1c today 11.2 -d/w pt and daughter - CBC w/Diff/Platelet - COMPLETE METABOLIC PANEL WITH GFR - POCT HgB A1C - Ambulatory referral to Endocrinology - Ambulatory referral to diabetic education - Ambulatory referral to Ophthalmology  2. PAD (peripheral artery disease) (HCC) Previously seen PAD by vascular - Ambulatory referral to Cardiology appt with pain management-phantom pain per daughter-percocet in the past QID-unable to see pain management due to no ID-rescheduled after DMV  3. Moderate mixed hyperlipidemia not requiring statin therapy Atorvastatin -unknown as no recent labwork - Lipid panel  4. Coronary artery disease involving native heart without angina pectoris, unspecified vessel or lesion type - TSH - Ambulatory referral to Cardiology No f/u for echo or evaluation-no use of nitro-bp well controlled currently 5. Urinary tract infection with hematuria, site unspecified Amoxil-sensitive previously with cultures-took Keflex in the past-CKF-pt with pressure and urgency-no follow up from 7/20 diagnosis-concern for renal failure-father died from MI during dialysis this year - Urine Culture - POCT URINALYSIS DIP (CLINITEK)  6. Cardiomyopathy, unspecified type ([place]) Cardio referral-no recent follow up  7. Chronic obstructive pulmonary disease, unspecified COPD type ([place]) No inhalers-stable per pt-no longer taking Xanax Right upper quad abdominal pain-reviewed MRI and CT-no gallstones, start protonix tob  abuse-pt is not interested in quitting  8. Abdominal pain-uti, reviewed CT/MRI results-labwork recommended Outpatient Encounter Medications as of 01/22/2019  Medication Sig  . ALPRAZolam (XANAX) 1 MG tablet Take 0.5 tablets (0.5 mg total) 3 (three) times daily as needed by mouth for anxiety. (Patient not taking: Reported on 01/22/2019)  . apixaban (ELIQUIS) 5 MG  TABS tablet Take 1 tablet (5 mg total) 2 (two) times daily by mouth.  Marland Kitchen atorvastatin (LIPITOR) 80 MG tablet Take 1 tablet (80 mg total) daily at 6 PM by mouth.  . carvedilol (COREG) 3.125 MG tablet Take 1 tablet (3.125 mg total) 2 (two) times daily with a meal by mouth.  . cephALEXin (KEFLEX) 500 MG capsule Take 1 capsule (500 mg total) by mouth 2 (two) times daily.  Marland Kitchen gabapentin (NEURONTIN) 300 MG capsule Take 300 mg by mouth at bedtime.  . insulin aspart (NOVOLOG FLEXPEN) 100 UNIT/ML injection Inject 2-10 Units into the skin 3 (three) times daily before meals. Sliding scale as follows 200-250=2 units 251-300=4 units 301-350=6 units 351-400=8 units >400-give 10 units and Notify MD  . Insulin Glargine (LANTUS SOLOSTAR) 100 UNIT/ML Solostar Pen Inject 10 Units at bedtime into the skin.  . Maltodextrin-Xanthan Gum (RESOURCE THICKENUP CLEAR) POWD Take 120 g as needed by mouth.  . nitroGLYCERIN (NITROSTAT) 0.4 MG SL tablet Place 1 tablet (0.4 mg total) under the tongue every 5 (five) minutes as needed for chest pain.  Marland Kitchen ondansetron (ZOFRAN) 4 MG tablet Take 1 tablet (4 mg total) by mouth every 6 (six) hours as needed for nausea or vomiting.  Marland Kitchen oxyCODONE-acetaminophen (PERCOCET) 10-325 MG tablet Take 0.5 tablets every 4 (four) hours as needed by mouth for pain. (Patient not taking: Reported on 01/22/2019)  . pantoprazole (PROTONIX) 40 MG tablet Take 1 tablet (40 mg total) by mouth daily.   No facility-administered encounter medications on file as of 01/22/2019.  1 hour spent with pt and daughter in review of labwork, exam, history, old records,  assessment , plan and reason for referrals. Discussed with diabetic education concern about previous non compliance and improved compliance with daughter.   Follow-up: 3 months  Inette Hannah Beat, MD

## 2019-01-22 NOTE — Patient Instructions (Signed)

## 2019-01-25 LAB — URINE CULTURE
MICRO NUMBER:: 10004643
SPECIMEN QUALITY:: ADEQUATE

## 2019-01-30 ENCOUNTER — Ambulatory Visit (INDEPENDENT_AMBULATORY_CARE_PROVIDER_SITE_OTHER): Payer: Medicaid Other

## 2019-01-30 ENCOUNTER — Other Ambulatory Visit: Payer: Self-pay

## 2019-01-30 DIAGNOSIS — N390 Urinary tract infection, site not specified: Secondary | ICD-10-CM | POA: Diagnosis not present

## 2019-01-30 DIAGNOSIS — Z794 Long term (current) use of insulin: Secondary | ICD-10-CM

## 2019-01-30 DIAGNOSIS — R319 Hematuria, unspecified: Secondary | ICD-10-CM

## 2019-01-30 DIAGNOSIS — E1151 Type 2 diabetes mellitus with diabetic peripheral angiopathy without gangrene: Secondary | ICD-10-CM

## 2019-01-30 LAB — POCT URINALYSIS DIPSTICK
Bilirubin, UA: NEGATIVE
Glucose, UA: POSITIVE — AB
Ketones, UA: NEGATIVE
Leukocytes, UA: NEGATIVE
Nitrite, UA: NEGATIVE
Protein, UA: POSITIVE — AB
Spec Grav, UA: >=1.03 — AB (ref 1.010–1.025)
Urobilinogen, UA: 0.2 E.U./dL
pH, UA: 6.5 (ref 5.0–8.0)

## 2019-02-01 ENCOUNTER — Telehealth: Payer: Self-pay | Admitting: Family Medicine

## 2019-02-01 LAB — URINE CULTURE
MICRO NUMBER:: 10034878
Result:: NO GROWTH
SPECIMEN QUALITY:: ADEQUATE

## 2019-02-01 NOTE — Telephone Encounter (Signed)
Serena Colonel try [phone]

## 2019-02-01 NOTE — Telephone Encounter (Signed)
Glucose and protein-culture negative for infection Diabetes not well controlled

## 2019-02-01 NOTE — Telephone Encounter (Signed)
I spoke to patients daughter Blima Rich and she is aware

## 2019-02-01 NOTE — Telephone Encounter (Signed)
Tried to reach daughter Vikki Ports and Sylvi both and phones are not working

## 2019-02-01 NOTE — Telephone Encounter (Signed)
Patient daughter is calling and states she dropped off a urine specimen and would like to know what it came back

## 2019-02-01 NOTE — Telephone Encounter (Signed)
Routing to Dr. Holly Bodily for results

## 2019-02-07 ENCOUNTER — Ambulatory Visit: Payer: Medicaid Other | Admitting: "Endocrinology

## 2019-02-15 ENCOUNTER — Ambulatory Visit: Payer: Medicaid Other | Admitting: Nutrition

## 2019-03-01 ENCOUNTER — Other Ambulatory Visit: Payer: Self-pay

## 2019-03-01 ENCOUNTER — Encounter: Payer: Self-pay | Admitting: Cardiology

## 2019-03-01 ENCOUNTER — Ambulatory Visit (INDEPENDENT_AMBULATORY_CARE_PROVIDER_SITE_OTHER): Payer: Medicaid Other | Admitting: Cardiology

## 2019-03-01 VITALS — BP 150/79 | HR 103 | Temp 97.2°F | Ht 61.0 in | Wt 111.0 lb

## 2019-03-01 DIAGNOSIS — I25119 Atherosclerotic heart disease of native coronary artery with unspecified angina pectoris: Secondary | ICD-10-CM | POA: Diagnosis not present

## 2019-03-01 DIAGNOSIS — E1165 Type 2 diabetes mellitus with hyperglycemia: Secondary | ICD-10-CM

## 2019-03-01 DIAGNOSIS — I255 Ischemic cardiomyopathy: Secondary | ICD-10-CM | POA: Diagnosis not present

## 2019-03-01 DIAGNOSIS — E782 Mixed hyperlipidemia: Secondary | ICD-10-CM

## 2019-03-01 DIAGNOSIS — D6859 Other primary thrombophilia: Secondary | ICD-10-CM

## 2019-03-01 MED ORDER — CARVEDILOL 6.25 MG PO TABS: 6.2500 mg | ORAL_TABLET | Freq: Two times a day (BID) | ORAL | 3 refills | Status: DC

## 2019-03-01 NOTE — Patient Instructions (Signed)
Medication Instructions:  INCREASE Coreg to 6.25 mg twice a day    *If you need a refill on your cardiac medications before your next appointment, please call your pharmacy*  Lab Work: None today If you have labs (blood work) drawn today and your tests are completely normal, you will receive your results only by: Marland Kitchen MyChart Message (if you have MyChart) OR . A paper copy in the mail If you have any lab test that is abnormal or we need to change your treatment, we will call you to review the results.  Testing/Procedures: Your physician has requested that you have an echocardiogram. Echocardiography is a painless test that uses sound waves to create images of your heart. It provides your doctor with information about the size and shape of your heart and how well your heart's chambers and valves are working. This procedure takes approximately one hour. There are no restrictions for this procedure.    Follow-Up: We will call you with results.         Thank you for choosing [facility] !

## 2019-03-01 NOTE — Progress Notes (Signed)
Cardiology Office Note  Date: 03/01/2019   ID: KARRY BARRILLEAUX, DOB 1970-12-02, MRN [id number]  PCP:  Maryruth Hancock, MD  Consulting Cardiologist: Satira Sark, MD Electrophysiologist:  None   Chief Complaint  Patient presents with  . History of CAD and PAD    History of Present Illness: [name] is a 49 y.o. female referred for cardiology consultation by Dr. Holly Bodily with history of CAD and PAD.  She was last seen in our practice back in 2018.  She has follow-up with Dr. Luan Pulling since then, recently established with Dr. Holly Bodily.  Records indicate left MCA distribution stroke back in 2018 in the setting of positive lupus anticoagulant with protein C and protein S deficiency.  She was anticoagulated with Eliquis.  She had been noncompliant with diabetic medications resulting in severe metabolic acidosis with DKA.  She was found to have evidence of ischemic cardiomyopathy with LVEF 30 to 35% managed medically.  She does have a known history of CAD with previous DES intervention to the LAD in 2014.  She was reportedly in a nursing home for about a year after that and has been back home living with her daughter for the last year and a half.  She has a history of prior embolectomy and ultimately left AKA. She was previously on Coumadin, this was discontinued by Dr. Martinique around the time of her cardiac intervention in September 2014. She was placed on DAPT at that time. She was subsequently placed on Eliquis by Dr. Luan Pulling, and this was continued after the stroke described above.  Type 2 diabetes mellitus is poorly controlled, recent hemoglobin A1c of 11.2%.  She has not completed follow-up lipid profile as yet.  She is here with her daughter today.  We went over her present medications and she has been compliant with Eliquis, Lipitor, and Coreg from a cardiac perspective.  She is currently on insulin and has pending consultation with endocrinology.  I personally reviewed her ECG today  which showed sinus tachycardia with probable biatrial enlargement, old anteroseptal infarct pattern, nonspecific T wave changes.  She does not describe any angina symptoms, is fairly sedentary.  She stays at home most of the time and has right-sided weakness mainly affecting her right arm.  Left leg prosthesis is in place.  Past Medical History:  Diagnosis Date  . Acquired absence of left leg below knee (HCC)   . Anxiety   . 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 Coronary atherosclerosis of native coronary artery    DES to LAD September 2014  . Depression   . Facial cellulitis    12/2010  . Glomerulonephritis   . Headache(784.0)   . Hemiplegia, unspecified affecting right dominant side ([place])   . History of stroke   . Hyperlipidemia   . Insulin dependent diabetes mellitus    History of diabetic ketoacidosis  . Ischemic cardiomyopathy    LVEF 40-45%  . Major depressive disorder, single episode, unspecified   . Peripheral arterial disease ([place])   . Shingles    11/2010  . ST elevation myocardial infarction (STEMI) of anterior wall [facility])    Late presentation September 2014    Past Surgical History:  Procedure Laterality Date  . AMPUTATION  03/03/2011   Procedure: AMPUTATION DIGIT;  Surgeon: Newt Minion, MD;  Location: [place];  Service: Orthopedics;  Laterality: Left;  Left Foot Amputation 4th and 5th toes at MTP joint  . AMPUTATION  04/22/2011   Procedure: AMPUTATION BELOW KNEE;  Surgeon: Newt Minion, MD;  Location: [place];  Service: Orthopedics;  Laterality: Left;  Left Below Knee Amputation  . DILATION AND CURETTAGE OF UTERUS    . EMBOLECTOMY  01/29/2011   Procedure: EMBOLECTOMY;  Surgeon: Mal Misty, MD;  Location: [facility];  Service: Vascular;  Laterality: Left;  Left Popliteal and Tibial Embolectomy with patch angioplasty  . INCISION AND DRAINAGE ABSCESS N/A 04/06/2016   Procedure: INCISION AND DRAINAGE ABSCESS;  Surgeon: Jonnie Kind, MD;  [facility];  Service: Gynecology;  Laterality: N/A;  . LEFT HEART CATHETERIZATION WITH CORONARY ANGIOGRAM N/A 10/01/2012   Procedure: LEFT HEART CATHETERIZATION WITH CORONARY ANGIOGRAM;  Surgeon: Peter M Martinique, MD;  Location: [facility] CATH LAB;  Service: Cardiovascular;  Laterality: N/A;  . LOWER EXTREMITY ANGIOGRAM N/A 01/27/2011   Procedure: LOWER EXTREMITY ANGIOGRAM;  Surgeon: Rosetta Posner, MD;  Location: [facility] CATH LAB;  Service: Cardiovascular;  Laterality: N/A;  . LOWER EXTREMITY ANGIOGRAM N/A 01/28/2011   Procedure: LOWER EXTREMITY ANGIOGRAM;  Surgeon: Conrad Stoddard, MD;  Location: [facility] CATH LAB;  Service: Cardiovascular;  Laterality: N/A;  . LOWER EXTREMITY ANGIOGRAM Left 01/29/2011   Procedure: LOWER EXTREMITY ANGIOGRAM;  Surgeon: Mal Misty, MD;  Location: [facility] CATH LAB;  Service: Cardiovascular;  Laterality: Left;  Marland Kitchen MULTIPLE TOOTH EXTRACTIONS    . TEE WITHOUT CARDIOVERSION  04/15/2011   Procedure: TRANSESOPHAGEAL ECHOCARDIOGRAM (TEE);  Surgeon: [name], MD;  [facility];  Service: Cardiovascular;  Laterality: N/A;  . WRIST SURGERY     Left    Current Outpatient Medications  Medication Sig Dispense Refill  . apixaban (ELIQUIS) 5 MG TABS tablet Take 1 tablet (5 mg total) 2 (two) times daily by mouth. 60 tablet 0  . atorvastatin (LIPITOR) 80 MG tablet Take 1 tablet (80 mg total) daily at 6 PM by mouth. 30 tablet 0  . insulin aspart (NOVOLOG FLEXPEN) 100 UNIT/ML injection Inject 2-10 Units into the skin 3 (three) times daily before meals. Sliding scale as follows 200-250=2 units 251-300=4 units 301-350=6 units 351-400=8 units >400-give 10 units and Notify MD    . Insulin Glargine (LANTUS SOLOSTAR) 100 UNIT/ML Solostar Pen Inject 10 Units at bedtime into the skin. 15 mL 11  . Maltodextrin-Xanthan Gum (RESOURCE THICKENUP CLEAR) POWD Take 120 g as needed by mouth. 3 Can 0  . nitroGLYCERIN (NITROSTAT) 0.4 MG SL tablet Place 1 tablet (0.4 mg total) under the tongue  every 5 (five) minutes as needed for chest pain. 25 tablet 5  . oxyCODONE-acetaminophen (PERCOCET) 10-325 MG tablet Take 0.5 tablets every 4 (four) hours as needed by mouth for pain. 120 tablet 0  . carvedilol (COREG) 6.25 MG tablet Take 1 tablet (6.25 mg total) by mouth 2 (two) times daily. 180 tablet 3   No current facility-administered medications for this visit.   Allergies:  Ibuprofen and Losartan   Social History: The patient  reports that she has been smoking cigarettes. She started smoking about 29 years ago. She has a 10.50 pack-year smoking history. She has never used smokeless tobacco. She reports current drug use. Frequency: 1.00 time per week. Drug: Marijuana. She reports that she does not drink alcohol.   Family History: The patient's family history includes COPD in her mother; Coronary artery disease in her father and mother; Diabetes in her father and mother; Hypertension in her father and mother; Stroke in her mother.   ROS:  Please see the  history of present illness. Otherwise, complete review of systems is positive for relative anhedonia, chronic pain.  All other systems are reviewed and negative.   Physical Exam: VS:  BP (!) 150/79   Pulse (!) 103   Temp (!) 97.2 F (36.2 C)   Ht 5\' 1"  (1.549 m)   Wt 111 lb (50.3 kg)   LMP 01/12/2011   SpO2 99%   BMI 20.97 kg/m , BMI Body mass index is 20.97 kg/m.  Wt Readings from Last 3 Encounters:  03/01/19 111 lb (50.3 kg)  01/22/19 114 lb 3.2 oz (51.8 kg)  08/02/18 100 lb (45.4 kg)    General: Chronically ill-appearing woman, no acute distress. HEENT: Conjunctiva and lids normal, wearing a mask. Neck: Supple, no elevated JVP or carotid bruits, no thyromegaly. Lungs: Decreased breath sounds without wheezing, nonlabored breathing at rest. Cardiac: Regular rate and rhythm, no S3, 2/6 systolic murmur, no pericardial rub. Abdomen: Soft, bowel sounds present. Extremities: Status post left AKA with prosthesis in place. Skin:  Warm and dry. Musculoskeletal: No kyphosis. Neuropsychiatric: Alert and oriented x3, weakness of right arm following prior stroke.  ECG:  An ECG dated 11/17/2016 was personally reviewed today and demonstrated:  Sinus rhythm with prolonged PR interval, probable old anteroseptal infarct pattern, nonspecific ST changes.  Recent Labwork: 08/02/2018: ALT 18; AST 18; BUN 25; Creatinine, Ser 1.63; Hemoglobin 16.7; Platelets 356; Potassium 4.2; Sodium 136     Component Value Date/Time   CHOL 162 11/07/2016 0722   TRIG 152 (H) 11/07/2016 0722   HDL 39 (L) 11/07/2016 0722   CHOLHDL 4.2 11/07/2016 0722   VLDL 30 11/07/2016 0722   LDLCALC 93 11/07/2016 0722    Other Studies Reviewed Today:  Echocardiogram 11/21/2016: Study Conclusions   - Left ventricle: The cavity size was normal. Wall thickness was  increased in a pattern of mild LVH. Systolic function was  moderately to severely reduced. The estimated ejection fraction  was in the range of 30% to 35%. There is hypokinesis of the  anteroseptal myocardium. There is akinesis of the apical  myocardium. Doppler parameters are consistent with abnormal left  ventricular relaxation (grade 1 diastolic dysfunction). Doppler  parameters are consistent with high ventricular filling pressure.  - Mitral valve: Calcified annulus. Mildly thickened leaflets .  There was mild regurgitation.   Impressions:   - Akinesis of the distal septum and apex with overall moderate to  severe LV dysfunction; mild diastolic dysfunction; mild LVH;  prominent apical trabeculae but no clear thrombus; mild MR.  Assessment and Plan:  1.  Hypercoagulable state with records indicating positive lupus anticoagulant, protein C and protein S deficiency.  She had a previous embolectomy involving the left lower extremity and ultimately left AKA.  Also left MCA distribution stroke more recently in 2018.  She has been on Eliquis long-term.  I reviewed her recent  lab work.  No reported spontaneous bleeding episodes.  2.  CAD by history status post DES to the LAD in 2014.  No obvious angina symptoms.  She is currently on low-dose beta-blocker and statin therapy, has nitroglycerin available as needed.  Plan to increase Coreg to 6.25 mg twice daily.  3.  Ischemic cardiomyopathy, LVEF 30 to 35% as of 2018.  Follow-up echocardiogram will be obtained.  Coreg is being uptitrated.  She has an allergy to ARB's.  We will decide of additional medication titration is a possibility.  Might be a candidate for hydralazine/nitrate combination depending on blood pressure over time and if  she has continued LV dysfunction.  She does not appear to be a good candidate for an ICD if LVEF remains depressed in similar range.  4.  Known left ICA occlusion.  5.  Uncontrolled type 2 diabetes mellitus with recent hemoglobin A1c 11.2%.  She is on insulin and has pending consultation with endocrinology.  6.  Follow-up lipids pending.  Reportedly compliant with high-dose Lipitor.  Would aim for LDL below 70.  Medication Adjustments/Labs and Tests Ordered: Current medicines are reviewed at length with the patient today.  Concerns regarding medicines are outlined above.   Tests Ordered: Orders Placed This Encounter  Procedures  . EKG 12-Lead  . ECHOCARDIOGRAM COMPLETE    Medication Changes: Meds ordered this encounter  Medications  . carvedilol (COREG) 6.25 MG tablet    Sig: Take 1 tablet (6.25 mg total) by mouth 2 (two) times daily.    Dispense:  180 tablet    Refill:  3    03/01/19 dose increased to 6.25 mg bid    Disposition:  Follow up test results and determine follow-up plan.  Signed, Satira Sark, MD, [facility] 03/01/2019 3:30 PM    [place] [facility] [place]. [address], [place], [place] [address] Phone: [phone]; Fax: [phone]

## 2019-03-09 ENCOUNTER — Ambulatory Visit (HOSPITAL_COMMUNITY): Payer: Medicaid Other

## 2019-03-10 ENCOUNTER — Other Ambulatory Visit: Payer: Self-pay | Admitting: Family Medicine

## 2019-03-12 ENCOUNTER — Ambulatory Visit (HOSPITAL_COMMUNITY): Payer: Medicaid Other

## 2019-03-16 ENCOUNTER — Other Ambulatory Visit: Payer: Self-pay

## 2019-03-16 ENCOUNTER — Ambulatory Visit (HOSPITAL_COMMUNITY)
Admission: RE | Admit: 2019-03-16 | Discharge: 2019-03-16 | Disposition: A | Discharge status: Home / Self Care | Payer: Medicaid Other | Source: Ambulatory Visit | Attending: Cardiology | Admitting: Cardiology

## 2019-03-16 DIAGNOSIS — I255 Ischemic cardiomyopathy: Secondary | ICD-10-CM | POA: Diagnosis not present

## 2019-03-16 NOTE — Progress Notes (Signed)
*  PRELIMINARY RESULTS* Echocardiogram 2D Echocardiogram has been performed.  [name] 03/16/2019, 1:58 PM

## 2019-03-22 ENCOUNTER — Ambulatory Visit: Payer: Medicaid Other | Admitting: Nutrition

## 2019-03-22 NOTE — Progress Notes (Unsigned)
NO show letter sent

## 2019-03-28 ENCOUNTER — Ambulatory Visit: Payer: Medicaid Other | Admitting: Nutrition

## 2019-04-23 ENCOUNTER — Ambulatory Visit: Payer: Medicaid Other | Admitting: Family Medicine

## 2019-06-12 ENCOUNTER — Other Ambulatory Visit: Payer: Self-pay | Admitting: Family Medicine

## 2019-06-13 NOTE — Telephone Encounter (Signed)
Please advise, pt has not done lab work nor has patient kept appointments.

## 2019-06-25 ENCOUNTER — Other Ambulatory Visit: Payer: Self-pay

## 2019-06-25 MED ORDER — ATORVASTATIN CALCIUM 80 MG PO TABS: 80.0000 mg | ORAL_TABLET | Freq: Every day | ORAL | 6 refills | Status: DC

## 2019-06-25 NOTE — Telephone Encounter (Signed)
Refilled atorvastatin

## 2019-07-16 ENCOUNTER — Ambulatory Visit: Payer: Medicaid Other | Admitting: Cardiology

## 2019-07-16 NOTE — Progress Notes (Deleted)
Cardiology Office Note  Date: 07/16/2019   ID: IKRAN [name], DOB 1970/09/12, MRN [id number]  PCP:  Lisa Hancock, MD  Cardiologist:  Rozann Lesches, MD Electrophysiologist:  None   No chief complaint on file.   History of Present Illness: [name] is a 49 y.o. female seen in consultation back in February.  Complex history as detailed in the last note.  Follow-up echocardiogram from February revealed LVEF up to the range of 45 to 50% with mild diastolic dysfunction, normal RV contraction.  Past Medical History:  Diagnosis Date  . Acquired absence of left leg below knee (HCC)   . Anxiety   . 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 Coronary atherosclerosis of native coronary artery    DES to LAD September 2014  . Depression   . Facial cellulitis    12/2010  . Glomerulonephritis   . Headache(784.0)   . Hemiplegia, unspecified affecting right dominant side ([place])   . History of stroke   . Hyperlipidemia   . Insulin dependent diabetes mellitus    History of diabetic ketoacidosis  . Ischemic cardiomyopathy    LVEF 40-45%  . Major depressive disorder, single episode, unspecified   . Peripheral arterial disease ([place])   . Shingles    11/2010  . ST elevation myocardial infarction (STEMI) of anterior wall [facility])    Late presentation September 2014    Past Surgical History:  Procedure Laterality Date  . AMPUTATION  03/03/2011   Procedure: AMPUTATION DIGIT;  Surgeon: Newt Minion, MD;  Location: [place];  Service: Orthopedics;  Laterality: Left;  Left Foot Amputation 4th and 5th toes at MTP joint  . AMPUTATION  04/22/2011   Procedure: AMPUTATION BELOW KNEE;  Surgeon: Newt Minion, MD;  Location: [place];  Service: Orthopedics;  Laterality: Left;  Left Below Knee Amputation  . DILATION AND CURETTAGE OF UTERUS    . EMBOLECTOMY  01/29/2011   Procedure: EMBOLECTOMY;  Surgeon: Mal Misty, MD;  Location: [facility] OR;  Service: Vascular;   Laterality: Left;  Left Popliteal and Tibial Embolectomy with patch angioplasty  . INCISION AND DRAINAGE ABSCESS N/A 04/06/2016   Procedure: INCISION AND DRAINAGE ABSCESS;  Surgeon: Jonnie Kind, MD;  [facility];  Service: Gynecology;  Laterality: N/A;  . LEFT HEART CATHETERIZATION WITH CORONARY ANGIOGRAM N/A 10/01/2012   Procedure: LEFT HEART CATHETERIZATION WITH CORONARY ANGIOGRAM;  Surgeon: Peter M Martinique, MD;  Location: [facility] CATH LAB;  Service: Cardiovascular;  Laterality: N/A;  . LOWER EXTREMITY ANGIOGRAM N/A 01/27/2011   Procedure: LOWER EXTREMITY ANGIOGRAM;  Surgeon: Rosetta Posner, MD;  Location: [facility] CATH LAB;  Service: Cardiovascular;  Laterality: N/A;  . LOWER EXTREMITY ANGIOGRAM N/A 01/28/2011   Procedure: LOWER EXTREMITY ANGIOGRAM;  Surgeon: Conrad Bristol, MD;  Location: [facility] CATH LAB;  Service: Cardiovascular;  Laterality: N/A;  . LOWER EXTREMITY ANGIOGRAM Left 01/29/2011   Procedure: LOWER EXTREMITY ANGIOGRAM;  Surgeon: Mal Misty, MD;  Location: [facility] CATH LAB;  Service: Cardiovascular;  Laterality: Left;  Marland Kitchen MULTIPLE TOOTH EXTRACTIONS    . TEE WITHOUT CARDIOVERSION  04/15/2011   Procedure: TRANSESOPHAGEAL ECHOCARDIOGRAM (TEE);  Surgeon: [name], MD;  [facility];  Service: Cardiovascular;  Laterality: N/A;  . WRIST SURGERY     Left    Current Outpatient Medications  Medication Sig Dispense Refill  . ACCU-CHEK AVIVA PLUS test strip USE TO TEST 4 TIMES DAILY AS DIRECTED. 100 strip  0  . apixaban (ELIQUIS) 5 MG TABS tablet Take 1 tablet (5 mg total) 2 (two) times daily by mouth. 60 tablet 0  . atorvastatin (LIPITOR) 80 MG tablet Take 1 tablet (80 mg total) by mouth daily at 6 PM. 30 tablet 6  . carvedilol (COREG) 6.25 MG tablet Take 1 tablet (6.25 mg total) by mouth 2 (two) times daily. 180 tablet 3  . insulin aspart (NOVOLOG FLEXPEN) 100 UNIT/ML injection Inject 2-10 Units into the skin 3 (three) times daily before meals. Sliding scale as follows 200-250=2  units 251-300=4 units 301-350=6 units 351-400=8 units >400-give 10 units and Notify MD    . Insulin Glargine (LANTUS SOLOSTAR) 100 UNIT/ML Solostar Pen Inject 10 Units at bedtime into the skin. 15 mL 11  . Maltodextrin-Xanthan Gum (RESOURCE THICKENUP CLEAR) POWD Take 120 g as needed by mouth. 3 Can 0  . nitroGLYCERIN (NITROSTAT) 0.4 MG SL tablet Place 1 tablet (0.4 mg total) under the tongue every 5 (five) minutes as needed for chest pain. 25 tablet 5  . oxyCODONE-acetaminophen (PERCOCET) 10-325 MG tablet Take 0.5 tablets every 4 (four) hours as needed by mouth for pain. 120 tablet 0   No current facility-administered medications for this visit.   Allergies:  Ibuprofen and Losartan   Social History: The patient  reports that she has been smoking cigarettes. She started smoking about 30 years ago. She has a 10.50 pack-year smoking history. She has never used smokeless tobacco. She reports current drug use. Frequency: 1.00 time per week. Drug: Marijuana. She reports that she does not drink alcohol.   Family History: The patient's family history includes COPD in her mother; Coronary artery disease in her father and mother; Diabetes in her father and mother; Hypertension in her father and mother; Stroke in her mother.   ROS:  Please see the history of present illness. Otherwise, complete review of systems is positive for {NONE DEFAULTED:18576::"none"}.  All other systems are reviewed and negative.   Physical Exam: VS:  LMP 01/12/2011 , BMI There is no height or weight on file to calculate BMI.  Wt Readings from Last 3 Encounters:  03/01/19 111 lb (50.3 kg)  01/22/19 114 lb 3.2 oz (51.8 kg)  08/02/18 100 lb (45.4 kg)    General: Patient appears comfortable at rest. HEENT: Conjunctiva and lids normal, oropharynx clear with moist mucosa. Neck: Supple, no elevated JVP or carotid bruits, no thyromegaly. Lungs: Clear to auscultation, nonlabored breathing at rest. Cardiac: Regular rate and  rhythm, no S3 or significant systolic murmur, no pericardial rub. Abdomen: Soft, nontender, no hepatomegaly, bowel sounds present, no guarding or rebound. Extremities: No pitting edema, distal pulses 2+. Skin: Warm and dry. Musculoskeletal: No kyphosis. Neuropsychiatric: Alert and oriented x3, affect grossly appropriate.  ECG:  An ECG dated 03/01/2019 was personally reviewed today and demonstrated:  Sinus tachycardia with probable biatrial enlargement, old anteroseptal infarct pattern, nonspecific T wave changes.  Recent Labwork: 08/02/2018: ALT 18; AST 18; BUN 25; Creatinine, Ser 1.63; Hemoglobin 16.7; Platelets 356; Potassium 4.2; Sodium 136     Component Value Date/Time   CHOL 162 11/07/2016 0722   TRIG 152 (H) 11/07/2016 0722   HDL 39 (L) 11/07/2016 0722   CHOLHDL 4.2 11/07/2016 0722   VLDL 30 11/07/2016 0722   LDLCALC 93 11/07/2016 0722    Other Studies Reviewed Today:  Echocardiogram 03/16/2019: 1. LV apex is trabeculated. Left ventricular ejection fraction, by  estimation, is 45 to 50%. The left ventricle has mildly decreased  function. The  left ventricle demonstrates regional wall motion  abnormalities (see scoring diagram/findings for  description). There is moderate left ventricular hypertrophy of the septal  segment. Left ventricular diastolic parameters are consistent with Grade I  diastolic dysfunction (impaired relaxation).  2. Right ventricular systolic function is normal. The right ventricular  size is normal.  3. The mitral valve is abnormal. No evidence of mitral valve  regurgitation.  4. The aortic valve is tricuspid. Aortic valve regurgitation is not  visualized. No aortic stenosis is present.  5. The inferior vena cava is normal in size with greater than 50%  respiratory variability, suggesting right atrial pressure of 3 mmHg.   Assessment and Plan:   Medication Adjustments/Labs and Tests Ordered: Current medicines are reviewed at length with the  patient today.  Concerns regarding medicines are outlined above.   Tests Ordered: No orders of the defined types were placed in this encounter.   Medication Changes: No orders of the defined types were placed in this encounter.   Disposition:  Follow up {follow up:15908}  Signed, Satira Sark, MD, [facility] 07/16/2019 8:52 AM    [facility] [place]. [address] [place], [place] [address] Phone: [phone]; Fax: [phone]

## 2019-09-22 ENCOUNTER — Other Ambulatory Visit: Payer: Self-pay | Admitting: Cardiology

## 2019-09-22 DIAGNOSIS — D6859 Other primary thrombophilia: Secondary | ICD-10-CM

## 2019-09-25 NOTE — Telephone Encounter (Signed)
Age 49, weight 50kg, SCr has not been checked in > 1 year and had increased from baseline of 0.6-0.8 up to 1.63. Pt needs labs recheck to verify which dose of Eliquis she needs to take.  Called pt, her daughter answered and states that pt's pain doctor recently checked many labs - Emelia Loron with [facility] in [place]. Called office and spoke with medical records who states they did not check a SCr on pt at all in the last year.  Called pt back and spoke with her daughter again. She will take pt to lab across the street from [facility] tomorrow. States pt has at least a few days of Eliquis left. Lab orders have been placed.

## 2019-09-28 ENCOUNTER — Other Ambulatory Visit: Payer: Self-pay

## 2019-09-28 ENCOUNTER — Other Ambulatory Visit (HOSPITAL_COMMUNITY)
Admission: RE | Admit: 2019-09-28 | Discharge: 2019-09-28 | Disposition: A | Discharge status: Home / Self Care | Payer: Medicaid Other | Source: Ambulatory Visit | Attending: Cardiology | Admitting: Cardiology

## 2019-09-28 DIAGNOSIS — D6859 Other primary thrombophilia: Secondary | ICD-10-CM | POA: Diagnosis not present

## 2019-09-28 LAB — CBC
HCT: 42.3 % (ref 36.0–46.0)
Hemoglobin: 13.7 g/dL (ref 12.0–15.0)
MCH: 30 pg (ref 26.0–34.0)
MCHC: 32.4 g/dL (ref 30.0–36.0)
MCV: 92.8 fL (ref 80.0–100.0)
Platelets: 418 10*3/uL — ABNORMAL HIGH (ref 150–400)
RBC: 4.56 MIL/uL (ref 3.87–5.11)
RDW: 13.2 % (ref 11.5–15.5)
WBC: 7.8 10*3/uL (ref 4.0–10.5)
nRBC: 0 % (ref 0.0–0.2)

## 2019-09-28 LAB — BASIC METABOLIC PANEL
Anion gap: 9 (ref 5–15)
BUN: 30 mg/dL — ABNORMAL HIGH (ref 6–20)
CO2: 22 mmol/L (ref 22–32)
Calcium: 8.4 mg/dL — ABNORMAL LOW (ref 8.9–10.3)
Chloride: 104 mmol/L (ref 98–111)
Creatinine, Ser: 2.26 mg/dL — ABNORMAL HIGH (ref 0.44–1.00)
GFR calc Af Amer: 28 mL/min — ABNORMAL LOW (ref >60)
GFR calc non Af Amer: 24 mL/min — ABNORMAL LOW (ref >60)
Glucose, Bld: 314 mg/dL — ABNORMAL HIGH (ref 70–99)
Potassium: 5.3 mmol/L — ABNORMAL HIGH (ref 3.5–5.1)
Sodium: 135 mmol/L (ref 135–145)

## 2019-09-28 MED ORDER — APIXABAN 5 MG PO TABS
5.0000 mg | ORAL_TABLET | Freq: Two times a day (BID) | ORAL | 3 refills | Status: DC
Start: 2019-09-28 — End: 2019-09-28

## 2019-09-28 MED ORDER — APIXABAN 5 MG PO TABS: 5.0000 mg | ORAL_TABLET | Freq: Two times a day (BID) | ORAL | 3 refills | Status: DC

## 2019-09-28 NOTE — Telephone Encounter (Signed)
Attempted to contact pt, spoke with Vikki Ports pt's daughter, she states she has not taken pt to the lab yet.  She states she is getting ready to take her today in the next hour or so, but she has enough Eliquis to last her through the weekend.  Will forward message to Edrick Oh, RN for [facility] to be on the lookout for these labs next week to assess Eliquis dosage and refill prescription.

## 2019-09-28 NOTE — Telephone Encounter (Signed)
refilled eliquis 

## 2019-10-02 ENCOUNTER — Other Ambulatory Visit: Payer: Self-pay | Admitting: *Deleted

## 2019-10-02 MED ORDER — APIXABAN 2.5 MG PO TABS: 2.5000 mg | ORAL_TABLET | Freq: Two times a day (BID) | ORAL | 6 refills | Status: DC

## 2019-10-02 NOTE — Telephone Encounter (Signed)
Thank you Lorenda.  Yes, should be on Eliquis 2.5 mg twice daily.  Please modify the prescription.

## 2019-10-02 NOTE — Telephone Encounter (Signed)
Dr. Domenic Polite  Pt went for Eliquis lab work on 09/28/19.  Scr 2.26  Hgb 13.7  Hct 42.3  Wt 50.3kg  Age 49.  Based on Wt of 50.3kg and SCr of 2.26 Eliquis dose should be reduced to 2.5mg  BID.  Do you agree or want to leave her on 5mg  bid.  Thanks, Lattie Haw

## 2019-11-20 ENCOUNTER — Ambulatory Visit: Payer: Medicaid Other | Admitting: Internal Medicine

## 2019-11-28 ENCOUNTER — Other Ambulatory Visit: Payer: Self-pay

## 2019-11-28 ENCOUNTER — Ambulatory Visit (INDEPENDENT_AMBULATORY_CARE_PROVIDER_SITE_OTHER): Payer: Medicaid Other | Admitting: Internal Medicine

## 2019-11-28 ENCOUNTER — Encounter: Payer: Self-pay | Admitting: Internal Medicine

## 2019-11-28 DIAGNOSIS — Z7689 Persons encountering health services in other specified circumstances: Secondary | ICD-10-CM | POA: Diagnosis not present

## 2019-11-28 DIAGNOSIS — Z8673 Personal history of transient ischemic attack (TIA), and cerebral infarction without residual deficits: Secondary | ICD-10-CM

## 2019-11-28 DIAGNOSIS — Z23 Encounter for immunization: Secondary | ICD-10-CM | POA: Diagnosis not present

## 2019-11-28 DIAGNOSIS — E1059 Type 1 diabetes mellitus with other circulatory complications: Secondary | ICD-10-CM

## 2019-11-28 DIAGNOSIS — Z1231 Encounter for screening mammogram for malignant neoplasm of breast: Secondary | ICD-10-CM

## 2019-11-28 DIAGNOSIS — E782 Mixed hyperlipidemia: Secondary | ICD-10-CM

## 2019-11-28 DIAGNOSIS — N184 Chronic kidney disease, stage 4 (severe): Secondary | ICD-10-CM

## 2019-11-28 DIAGNOSIS — I429 Cardiomyopathy, unspecified: Secondary | ICD-10-CM

## 2019-11-28 DIAGNOSIS — I739 Peripheral vascular disease, unspecified: Secondary | ICD-10-CM

## 2019-11-28 DIAGNOSIS — Z1211 Encounter for screening for malignant neoplasm of colon: Secondary | ICD-10-CM

## 2019-11-28 DIAGNOSIS — N186 End stage renal disease: Secondary | ICD-10-CM | POA: Insufficient documentation

## 2019-11-28 DIAGNOSIS — I2510 Atherosclerotic heart disease of native coronary artery without angina pectoris: Secondary | ICD-10-CM

## 2019-11-28 DIAGNOSIS — E109 Type 1 diabetes mellitus without complications: Secondary | ICD-10-CM | POA: Insufficient documentation

## 2019-11-28 LAB — POCT HEMOGLOBIN: Hemoglobin: 11.4 g/dL (ref 11–14.6)

## 2019-11-28 NOTE — Patient Instructions (Signed)
Please continue to take Lantus 15 U at bedtime.  Please add Novolog 6 U before meals on top of the sliding scale. If you notice glucose readings less than 70 or more than 300, please contact us.  You are being referred to Nephrologist for chronic kidney disease.  You are being referred to Endocrinologist for diabetes management.  Please continue to take medications as prescribed.  Please continue to check blood glucose before meals and at bedtime.  Please continue to follow diabetic diet.

## 2019-11-28 NOTE — Assessment & Plan Note (Signed)
Care established Previous chart reviewed History and medications reviewed with the patient 

## 2019-11-28 NOTE — Assessment & Plan Note (Signed)
In 2018, with residual right sided weakness, more profound in UE Has h/o Protein C and S deficiency and Lupus anticoagulant On Eliquis, not on any antiplatelet On statin

## 2019-11-28 NOTE — Assessment & Plan Note (Signed)
S/p stent to LAD Not on any antiplatelet, on Eliquis Follows up with Cardiologist 

## 2019-11-28 NOTE — Assessment & Plan Note (Signed)
Related to type 1 DM S/p left AKA On Eliquis due to coagulopathy 

## 2019-11-28 NOTE — Progress Notes (Addendum)
New Patient Office Visit  Subjective:  Patient ID: [name], female    DOB: 1970-03-23  Age: 49 y.o. 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  She has longstanding history of type I DM since she was 49 years old.  Her last HbA1c was 11.2 in 01/2019.  She has been taking Lantus 15 units at bedtime and follows insulin sliding scale.  Her daughter checks her blood sugars before meals and at bedtime, which have been ranging around 100 most of the time, with some readings above 300.  He used to see endocrinologist in the past.  She denies any polyuria, polyphagia, urinary frequency, dysuria or hematuria.  She had a stroke in 2018, with residual right-sided weakness, more profound in the upper extremity.  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  She also has progressive CKD, last CMP reviewed.  She denies any dysuria, hematuria or urinary hesitancy.  She follows up with cardiologist for CAD and ischemic cardiomyopathy management.  She is on Coreg, blood pressure is well-controlled.  She denies any headache, dizziness, chest pain, dyspnea or palpitations.  She has not had any colonoscopy yet.  She is willing to get Cologuard done.  She is due for mammography.  She has not had COVID vaccine yet.  Upon explaining benefits, she states that she will take it later.  She received flu vaccine in the office today.    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[place])    Prior embolectomy, previously on Coumadin   Cataract     Phreesia 11/19/2019   Chronic kidney disease    Phreesia 11/19/2019   Clotting disorder ([place])    Phreesia 11/19/2019   Coronary atherosclerosis of native coronary artery    DES to LAD September 2014   Depression    Depression    Phreesia 11/19/2019   Diabetes mellitus without complication ([place])    Phreesia 11/19/2019   Diabetic ketoacidosis with coma associated with type 1 diabetes mellitus ([place])    DKA, type 1 ([place]) 11/17/2016   Facial cellulitis    12/2010   Glomerulonephritis    Headache(784.0)    Hemiplegia, unspecified affecting right dominant side ([place])    History of stroke    Hyperlipidemia    Insulin dependent diabetes mellitus    History of diabetic ketoacidosis   Ischemic cardiomyopathy    LVEF 40-45%   Major depressive disorder, single episode, unspecified    Pancreatitis, acute 11/07/2016   Peripheral arterial disease ([place])    Shingles    11/2010   ST elevation myocardial infarction (STEMI) of anterior wall [facility])    Late presentation September 2014   Stroke [facility])    Phreesia 11/19/2019    Past Surgical History:  Procedure Laterality Date   AMPUTATION  03/03/2011   Procedure: AMPUTATION DIGIT;  Surgeon: Newt Minion, MD;  Location: [place];  Service: Orthopedics;  Laterality: Left;  Left Foot Amputation 4th and 5th toes at MTP joint   AMPUTATION  04/22/2011   Procedure:  AMPUTATION BELOW KNEE;  Surgeon: Newt Minion, MD;  Location: [place];  Service: Orthopedics;  Laterality: Left;  Left Below Knee Amputation   DILATION AND CURETTAGE OF UTERUS     EMBOLECTOMY  01/29/2011   Procedure: EMBOLECTOMY;  Surgeon: Mal Misty, MD;  Location: [place];  Service: Vascular;  Laterality: Left;  Left Popliteal and Tibial Embolectomy with patch angioplasty   INCISION AND DRAINAGE ABSCESS N/A 04/06/2016   Procedure: INCISION AND DRAINAGE ABSCESS;  Surgeon: Jonnie Kind, MD;  Location: [facility];  Service: Gynecology;  Laterality: N/A;   LEFT HEART CATHETERIZATION WITH CORONARY  ANGIOGRAM N/A 10/01/2012   Procedure: LEFT HEART CATHETERIZATION WITH CORONARY ANGIOGRAM;  Surgeon: Peter M Martinique, MD;  Location: [facility] CATH LAB;  Service: Cardiovascular;  Laterality: N/A;   LOWER EXTREMITY ANGIOGRAM N/A 01/27/2011   Procedure: LOWER EXTREMITY ANGIOGRAM;  Surgeon: Rosetta Posner, MD;  Location: [facility] CATH LAB;  Service: Cardiovascular;  Laterality: N/A;   LOWER EXTREMITY ANGIOGRAM N/A 01/28/2011   Procedure: LOWER EXTREMITY ANGIOGRAM;  Surgeon: Conrad Langston, MD;  Location: [facility] CATH LAB;  Service: Cardiovascular;  Laterality: N/A;   LOWER EXTREMITY ANGIOGRAM Left 01/29/2011   Procedure: LOWER EXTREMITY ANGIOGRAM;  Surgeon: Mal Misty, MD;  Location: [facility] CATH LAB;  Service: Cardiovascular;  Laterality: Left;   MULTIPLE TOOTH EXTRACTIONS     TEE WITHOUT CARDIOVERSION  04/15/2011   Procedure: TRANSESOPHAGEAL ECHOCARDIOGRAM (TEE);  Surgeon: [name], MD;  Location[facility];  Service: Cardiovascular;  Laterality: N/A;   WRIST SURGERY     Left    Family History  Problem Relation Age of Onset   COPD Mother    Hypertension Mother    Diabetes Mother    Stroke Mother    Coronary artery disease Mother    Diabetes Father    Hypertension Father    Coronary artery disease Father    Anesthesia problems Neg Hx     Social History   Socioeconomic History   Marital status: Single    Spouse name: Not on file   Number of children: Not on file   Years of education: Not on file   Highest education level: Not on file  Occupational History   Not on file  Tobacco Use   Smoking status: Current Every Day Smoker    Packs/day: 0.50    Years: 21.00    Pack years: 10.50    Types: Cigarettes    Start date: 07/06/1989    Last attempt to quit: 01/30/2011    Years since quitting: 8.8   Smokeless tobacco: Never Used  Vaping Use   Vaping Use: Never used  Substance and Sexual Activity   Alcohol use: No    Alcohol/week: 0.0 standard drinks    Comment: rarely   Drug use: Yes     Frequency: 1.0 times per week    Types: Marijuana    Comment: Former   Sexual activity: Yes    Partners: Male    Comment: pt stated that her tubal was only 50% effective- "not cut and burned"  Other Topics Concern   Not on file  Social History Narrative   Unemployed.  Lives in [place] with [place], [place], and mother.  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   Social Determinants of Health   Financial Resource Strain:    Difficulty of Paying Living Expenses: Not on file  Food Insecurity:    Worried About [place] in the Last Year:  Not on file   Ran Out of Food in the Last Year: Not on file  Transportation Needs:    Lack of Transportation (Medical): Not on file   Lack of Transportation (Non-Medical): Not on file  Physical Activity:    Days of Exercise per Week: Not on file   Minutes of Exercise per Session: Not on file  Stress:    Feeling of Stress : Not on file  Social Connections:    Frequency of Communication with Friends and Family: Not on file   Frequency of Social Gatherings with Friends and Family: Not on file   Attends Religious Services: Not on file   Active Member of Clubs or Organizations: Not on file   Attends Archivist Meetings: Not on file   Marital Status: Not on file  Intimate Partner Violence:    Fear of Current or Ex-Partner: Not on file   Emotionally Abused: Not on file   Physically Abused: Not on file   Sexually Abused: Not on file    ROS Review of Systems  Constitutional: Negative for chills and fever.  HENT: Negative for congestion, sinus pressure, sinus pain and sore throat.   Eyes: Negative for pain and discharge.  Respiratory: Negative for cough and shortness of breath.   Cardiovascular: Negative for chest pain and palpitations.  Gastrointestinal: Negative for abdominal pain, constipation, diarrhea, nausea and vomiting.  Endocrine: Negative for polydipsia and polyuria.  Genitourinary: Negative for dysuria and  hematuria.  Musculoskeletal: Negative for neck pain and neck stiffness.  Skin: Negative for rash.  Neurological: Negative for dizziness and weakness.  Psychiatric/Behavioral: Negative for agitation and behavioral problems.    Objective:   Today's Vitals: LMP 01/12/2011   Physical Exam Vitals reviewed.  Constitutional:      General: She is not in acute distress.    Appearance: She is not diaphoretic.  HENT:     Head: Normocephalic and atraumatic.     Nose: Nose normal.     Mouth/Throat:     Mouth: Mucous membranes are moist.  Eyes:     General: No scleral icterus.    Extraocular Movements: Extraocular movements intact.     Pupils: Pupils are equal, round, and reactive to light.  Cardiovascular:     Rate and Rhythm: Normal rate and regular rhythm.     Pulses: Normal pulses.     Heart sounds: Normal heart sounds. No murmur heard. Pulmonary:     Breath sounds: Normal breath sounds. No wheezing or rales.  Abdominal:     Palpations: Abdomen is soft.     Tenderness: There is no abdominal tenderness.  Musculoskeletal:     Cervical back: Neck supple. No tenderness.     Comments: S/p left BKA, prosthesis in place  Skin:    General: Skin is warm.  Neurological:     General: No focal deficit present.     Mental Status: She is alert and oriented to person, place, and time.     Sensory: No sensory deficit.     Motor: No weakness.  Psychiatric:        Mood and Affect: Mood normal.        Behavior: Behavior normal.     Assessment & Plan:   Problem List Items Addressed This Visit       Cardiovascular and Mediastinum   Coronary artery disease involving native heart without angina pectoris    S/p stent to LAD Not on any antiplatelet, on Eliquis Follows up with Cardiologist  PAD (peripheral artery disease) (HCC)    Related to type 1 DM S/p left AKA On Eliquis due to coagulopathy      Cardiomyopathy ([place])    Ischemic On Coreg 6.25 mg BID Follows up with  Cardiologist        Endocrine   Diabetes mellitus type I ([place])    HbA1C:  today in office On Lantus 15 U qHS and ISS Added 6 U Novolog TID Advised to follow diabetic diet On statin Diabetic foot exam: Today Diabetic eye exam: In 10/2019, no retinopathy      Relevant Orders   Ambulatory referral to Endocrinology   Microalbumin, urine   POCT hemoglobin     Genitourinary   Stage 4 chronic kidney disease ([place])    Related to uncontrolled type 2 DM Progressive CKD Referred to Nephrology      Relevant Orders   Ambulatory referral to Nephrology     Other   Hyperlipidemia    On Atorvastatin 80 mg QD      History of stroke    In 2018, with residual right sided weakness, more profound in UE Has h/o Protein C and S deficiency and Lupus anticoagulant On Eliquis, not on any antiplatelet On statin       Encounter to establish care - Primary    Care established Previous chart reviewed History and medications reviewed with the patient         Other Visit Diagnoses     Encounter for screening mammogram for breast cancer       Relevant Orders   MM Digital Screening   Screening for colon cancer       Relevant Orders   Cologuard   Need for immunization against influenza       Relevant Orders   Flu Vaccine QUAD 36+ mos IM (Completed)       Outpatient Encounter Medications as of 11/28/2019  Medication Sig   ACCU-CHEK AVIVA PLUS test strip USE TO TEST 4 TIMES DAILY AS DIRECTED.   apixaban (ELIQUIS) 2.5 MG TABS tablet Take 1 tablet (2.5 mg total) by mouth 2 (two) times daily.   atorvastatin (LIPITOR) 80 MG tablet Take 1 tablet (80 mg total) by mouth daily at 6 PM.   carvedilol (COREG) 6.25 MG tablet Take 1 tablet (6.25 mg total) by mouth 2 (two) times daily.   insulin aspart (NOVOLOG FLEXPEN) 100 UNIT/ML injection Inject 2-10 Units into the skin 3 (three) times daily before meals. Sliding scale as follows 200-250=2 units 251-300=4 units 301-350=6 units 351-400=8  units >400-give 10 units and Notify MD   Insulin Glargine (LANTUS SOLOSTAR) 100 UNIT/ML Solostar Pen Inject 10 Units at bedtime into the skin.   Maltodextrin-Xanthan Gum (RESOURCE THICKENUP CLEAR) POWD Take 120 g as needed by mouth.   nitroGLYCERIN (NITROSTAT) 0.4 MG SL tablet Place 1 tablet (0.4 mg total) under the tongue every 5 (five) minutes as needed for chest pain.   oxyCODONE-acetaminophen (PERCOCET) 10-325 MG tablet Take 0.5 tablets every 4 (four) hours as needed by mouth for pain.   No facility-administered encounter medications on file as of 11/28/2019.    Follow-up: Return in about 6 weeks (around 01/09/2020).   Lindell Spar, MD

## 2019-11-28 NOTE — Assessment & Plan Note (Signed)
Related to uncontrolled type 2 DM Progressive CKD Referred to Nephrology 

## 2019-11-28 NOTE — Assessment & Plan Note (Signed)
On Atorvastatin 80 mg QD ?

## 2019-11-28 NOTE — Assessment & Plan Note (Signed)
Ischemic On Coreg 6.25 mg BID Follows up with Cardiologist 

## 2019-11-28 NOTE — Assessment & Plan Note (Addendum)
HbA1C:  today in office On Lantus 15 U qHS and ISS Added 6 U Novolog TID Advised to follow diabetic diet On statin Diabetic foot exam: Today Diabetic eye exam: In 10/2019, no retinopathy

## 2019-11-30 LAB — MICROALBUMIN, URINE: Microalbumin, Urine: 5287.3 ug/mL

## 2019-12-07 ENCOUNTER — Encounter: Payer: Self-pay | Admitting: Nurse Practitioner

## 2019-12-07 ENCOUNTER — Ambulatory Visit: Payer: Medicaid Other | Admitting: Nurse Practitioner

## 2019-12-10 ENCOUNTER — Other Ambulatory Visit: Payer: Self-pay | Admitting: Nephrology

## 2019-12-10 DIAGNOSIS — N189 Chronic kidney disease, unspecified: Secondary | ICD-10-CM

## 2019-12-11 ENCOUNTER — Encounter: Payer: Self-pay | Admitting: Nurse Practitioner

## 2019-12-11 ENCOUNTER — Ambulatory Visit (INDEPENDENT_AMBULATORY_CARE_PROVIDER_SITE_OTHER): Payer: Medicaid Other | Admitting: Nurse Practitioner

## 2019-12-11 ENCOUNTER — Other Ambulatory Visit: Payer: Self-pay

## 2019-12-11 VITALS — BP 149/83 | HR 80 | Ht 61.0 in | Wt 116.0 lb

## 2019-12-11 DIAGNOSIS — E1059 Type 1 diabetes mellitus with other circulatory complications: Secondary | ICD-10-CM

## 2019-12-11 DIAGNOSIS — E782 Mixed hyperlipidemia: Secondary | ICD-10-CM | POA: Diagnosis not present

## 2019-12-11 DIAGNOSIS — I10 Essential (primary) hypertension: Secondary | ICD-10-CM

## 2019-12-11 LAB — POCT GLYCOSYLATED HEMOGLOBIN (HGB A1C): HbA1c, POC (controlled diabetic range): 11.4 % — AB (ref 0.0–7.0)

## 2019-12-11 NOTE — Progress Notes (Signed)
Endocrinology Consult Note       12/11/2019, 5:06 PM   Subjective:    Patient ID: [name], female    DOB: 08-03-1970.  [name] is being seen in consultation for management of currently uncontrolled symptomatic diabetes requested by  Lindell Spar, MD.   Past Medical History:  Diagnosis Date  . Acquired absence of left leg below knee (HCC)   . Anxiety   . 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 Cataract    Phreesia 11/19/2019  . Chronic kidney disease    Phreesia 11/19/2019  . Clotting disorder ([place])    Phreesia 11/19/2019  . Coronary atherosclerosis of native coronary artery    DES to LAD September 2014  . Depression   . Depression    Phreesia 11/19/2019  . Diabetes mellitus without complication ([place])    Phreesia 11/19/2019  . Diabetic ketoacidosis with coma associated with type 1 diabetes mellitus ([place])   . DKA, type 1 ([place]) 11/17/2016  . Facial cellulitis    12/2010  . Glomerulonephritis   . Headache(784.0)   . Hemiplegia, unspecified affecting right dominant side ([place])   . History of stroke   . Hyperlipidemia   . Insulin dependent diabetes mellitus    History of diabetic ketoacidosis  . Ischemic cardiomyopathy    LVEF 40-45%  . Major depressive disorder, single episode, unspecified   . Pancreatitis, acute 11/07/2016  . Peripheral arterial disease ([place])   . Shingles    11/2010  . ST elevation myocardial infarction (STEMI) of anterior wall [facility])    Late presentation September 2014  . Stroke [facility]    Phreesia 11/19/2019    Past Surgical History:  Procedure Laterality Date  . AMPUTATION  03/03/2011   Procedure: AMPUTATION DIGIT;  Surgeon: Newt Minion, MD;  Location: [place];  Service: Orthopedics;  Laterality: Left;  Left Foot Amputation 4th and 5th toes at MTP joint  . AMPUTATION  04/22/2011   Procedure: AMPUTATION BELOW KNEE;   Surgeon: Newt Minion, MD;  Location: [place];  Service: Orthopedics;  Laterality: Left;  Left Below Knee Amputation  . DILATION AND CURETTAGE OF UTERUS    . EMBOLECTOMY  01/29/2011   Procedure: EMBOLECTOMY;  Surgeon: Mal Misty, MD;  Location: [facility] OR;  Service: Vascular;  Laterality: Left;  Left Popliteal and Tibial Embolectomy with patch angioplasty  . INCISION AND DRAINAGE ABSCESS N/A 04/06/2016   Procedure: INCISION AND DRAINAGE ABSCESS;  Surgeon: Jonnie Kind, MD;  Location: [facility] ORS;  Service: Gynecology;  Laterality: N/A;  . LEFT HEART CATHETERIZATION WITH CORONARY ANGIOGRAM N/A 10/01/2012   Procedure: LEFT HEART CATHETERIZATION WITH CORONARY ANGIOGRAM;  Surgeon: Peter M Martinique, MD;  Location: [facility] CATH LAB;  Service: Cardiovascular;  Laterality: N/A;  . LOWER EXTREMITY ANGIOGRAM N/A 01/27/2011   Procedure: LOWER EXTREMITY ANGIOGRAM;  Surgeon: Rosetta Posner, MD;  Location: [facility] CATH LAB;  Service: Cardiovascular;  Laterality: N/A;  . LOWER EXTREMITY ANGIOGRAM N/A 01/28/2011   Procedure: LOWER EXTREMITY ANGIOGRAM;  Surgeon: Conrad Baraga, MD;  Location: [facility] CATH LAB;  Service: Cardiovascular;  Laterality: N/A;  . LOWER EXTREMITY ANGIOGRAM Left 01/29/2011   Procedure: LOWER EXTREMITY ANGIOGRAM;  Surgeon: Mal Misty, MD;  Location: [facility] CATH LAB;  Service: Cardiovascular;  Laterality: Left;  Marland Kitchen MULTIPLE TOOTH EXTRACTIONS    . TEE WITHOUT CARDIOVERSION  04/15/2011   Procedure: TRANSESOPHAGEAL ECHOCARDIOGRAM (TEE);  Surgeon: [name], MD;  Location: [facility];  Service: Cardiovascular;  Laterality: N/A;  . WRIST SURGERY     Left    Social History   Socioeconomic History  . Marital status: Single    Spouse name: Not on file  . Number of children: Not on file  . Years of education: Not on file  . Highest education level: Not on file  Occupational History  . Not on file  Tobacco Use  . Smoking status: Current Every Day Smoker    Packs/day: 0.50    Years: 21.00    Pack years: 10.50     Types: Cigarettes    Start date: 07/06/1989    Last attempt to quit: 01/30/2011    Years since quitting: 8.8  . Smokeless tobacco: Never Used  Vaping Use  . Vaping Use: Never used  Substance and Sexual Activity  . Alcohol use: No    Alcohol/week: 0.0 standard drinks    Comment: rarely  . Drug use: Yes    Frequency: 1.0 times per week    Types: Marijuana    Comment: Former  . Sexual activity: Yes    Partners: Male    Comment: pt stated that her tubal was only 50% effective- "not cut and burned"  Other Topics Concern  . Not on file  Social History Narrative   Unemployed.  Lives in [place] with [place], [place], and mother.  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   Social Determinants of Health   Financial Resource Strain:   . Difficulty of Paying Living Expenses: Not on file  Food Insecurity:   . Worried About Charity fundraiser in the Last Year: Not on file  . Ran Out of Food in the Last Year: Not on file  Transportation Needs:   . Lack of Transportation (Medical): Not on file  . Lack of Transportation (Non-Medical): Not on file  Physical Activity:   . Days of Exercise per Week: Not on file  . Minutes of Exercise per Session: Not on file  Stress:   . Feeling of Stress : Not on file  Social Connections:   . Frequency of Communication with Friends and Family: Not on file  . Frequency of Social Gatherings with Friends and Family: Not on file  . Attends Religious Services: Not on file  . Active Member of Clubs or Organizations: Not on file  . Attends Archivist Meetings: Not on file  . Marital Status: Not on file    Family History  Problem Relation Age of Onset  . COPD Mother   . Hypertension Mother   . Diabetes Mother   . Stroke Mother   . Coronary artery disease Mother   . Diabetes Father   . Hypertension Father   . Coronary artery disease Father   . Anesthesia problems Neg Hx     Outpatient Encounter Medications as of 12/11/2019   Medication Sig  . ACCU-CHEK AVIVA PLUS test strip USE TO TEST 4 TIMES DAILY AS DIRECTED.  Marland Kitchen apixaban (ELIQUIS) 2.5 MG TABS tablet Take 1 tablet (2.5 mg total) by mouth 2 (two) times daily.  Marland Kitchen atorvastatin (LIPITOR) 80 MG  tablet Take 1 tablet (80 mg total) by mouth daily at 6 PM.  . carvedilol (COREG) 6.25 MG tablet Take 1 tablet (6.25 mg total) by mouth 2 (two) times daily.  . insulin aspart (NOVOLOG FLEXPEN) 100 UNIT/ML injection Inject 4-7 Units into the skin 3 (three) times daily before meals. Sliding scale as follows 200-250=2 units 251-300=4 units 301-350=6 units 351-400=8 units >400-give 10 units and Notify MD  . Insulin Glargine (LANTUS SOLOSTAR) 100 UNIT/ML Solostar Pen Inject 10 Units at bedtime into the skin.  . Maltodextrin-Xanthan Gum (RESOURCE THICKENUP CLEAR) POWD Take 120 g as needed by mouth.  . nitroGLYCERIN (NITROSTAT) 0.4 MG SL tablet Place 1 tablet (0.4 mg total) under the tongue every 5 (five) minutes as needed for chest pain.  Marland Kitchen oxyCODONE-acetaminophen (PERCOCET) 10-325 MG tablet Take 0.5 tablets every 4 (four) hours as needed by mouth for pain.   No facility-administered encounter medications on file as of 12/11/2019.    ALLERGIES: Allergies  Allergen Reactions  . Ibuprofen Other (See Comments)    Kidney disease  . Losartan Swelling    VACCINATION STATUS: Immunization History  Administered Date(s) Administered  . Influenza, Seasonal, Injecte, Preservative Fre 11/23/2018  . Influenza,inj,Quad PF,6+ Mos 10/24/2014, 11/28/2019  . Influenza-Unspecified 11/08/2011, 01/15/2013, 11/20/2015  . Pneumococcal Polysaccharide-23 11/08/2016    Diabetes She presents for her initial diabetic visit. She has type 1 diabetes mellitus. Onset time: She was diagnosed at approximate age of 60. Her disease course has been fluctuating. There are no hypoglycemic associated symptoms. Associated symptoms include blurred vision, fatigue, polydipsia, polyphagia and polyuria. There are no  hypoglycemic complications. Symptoms are worsening. Diabetic complications include a CVA, heart disease, nephropathy, peripheral neuropathy and PVD. Risk factors for coronary artery disease include diabetes mellitus, dyslipidemia, hypertension, sedentary lifestyle and tobacco exposure. Current diabetic treatment includes intensive insulin program. She is compliant with treatment some of the time. Her weight is fluctuating minimally. She is following a generally unhealthy diet. When asked about meal planning, she reported none. She has not had a previous visit with a dietitian. She never (Has L AKA with prosthesis) participates in exercise. (She presents today for her consultation, accompanied by her daughter, with no meter or logs to review.  Her POCT A1c today is 11.4%.  She does monitor her glucose 2-3 times per day and frequently skips meals due to her sleep pattern.  She admits to the consumption of sugary beverages (soda) but has cut back significantly.  She denies any significant hypoglycemia recently.) An ACE inhibitor/angiotensin II receptor blocker is not being taken. She sees a podiatrist.Eye exam is current.  Hyperlipidemia This is a chronic problem. The current episode started more than 1 year ago. The problem is uncontrolled. Recent lipid tests were reviewed and are variable. Exacerbating diseases include chronic renal disease and diabetes. Factors aggravating her hyperlipidemia include beta blockers, smoking and fatty foods. Current antihyperlipidemic treatment includes statins. Compliance problems include adherence to diet and adherence to exercise.  Risk factors for coronary artery disease include diabetes mellitus, dyslipidemia, hypertension and a sedentary lifestyle.  Hypertension This is a chronic problem. The current episode started more than 1 year ago. The problem has been gradually worsening since onset. The problem is uncontrolled. Associated symptoms include blurred vision. There are no  associated agents to hypertension. Risk factors for coronary artery disease include diabetes mellitus, dyslipidemia, smoking/tobacco exposure and sedentary lifestyle. Past treatments include beta blockers. The current treatment provides mild improvement. Compliance problems include diet and exercise.  Hypertensive end-organ damage  includes kidney disease, CAD/MI, CVA, heart failure and PVD. Identifiable causes of hypertension include chronic renal disease.    Review of systems  Constitutional: + Minimally fluctuating body weight,  current Body mass index is 21.92 kg/m. , + fatigue, no subjective hyperthermia, no subjective hypothermia Eyes: + blurry vision (schedules to have cataract removal next month), no xerophthalmia ENT: no sore throat, no nodules palpated in throat, no dysphagia/odynophagia, no hoarseness Cardiovascular: no chest pain, no shortness of breath, no palpitations, no leg swelling Respiratory: no cough, no shortness of breath Gastrointestinal: no nausea/vomiting/diarrhea Genitourinary: +polyuria, + polyphagia Musculoskeletal: no muscle/joint aches, Hx of L AKA- with prosthesis Skin: no rashes, no hyperemia Neurological: no tremors, no numbness, no tingling, no dizziness Psychiatric: no depression, no anxiety   Objective:    BP (!) 149/83   Pulse 80   Ht 5\' 1"  (1.549 m)   Wt 116 lb (52.6 kg)   LMP 01/12/2011   BMI 21.92 kg/m   Wt Readings from Last 3 Encounters:  12/11/19 116 lb (52.6 kg)  03/01/19 111 lb (50.3 kg)  01/22/19 114 lb 3.2 oz (51.8 kg)    BP Readings from Last 3 Encounters:  12/11/19 (!) 149/83  03/01/19 (!) 150/79  01/22/19 119/70    Physical Exam- Limited  Constitutional:  Body mass index is 21.92 kg/m. , not in acute distress, mildly anxious state of mind Eyes:  EOMI, no exophthalmos Neck: Supple Cardiovascular: RRR, no murmers, rubs, or gallops, no edema Respiratory: Adequate breathing efforts, no crackles, rales, rhonchi, or  wheezing Musculoskeletal: L AKA with prosthesis, strength intact in all four extremities, no gross restriction of joint movements Skin:  no rashes, no hyperemia, + nicotinic discoloration to fingernails Neurological: no tremor with outstretched hands   CMP ( most recent) CMP     Component Value Date/Time   NA 135 09/28/2019 1456   K 5.3 (H) 09/28/2019 1456   CL 104 09/28/2019 1456   CO2 22 09/28/2019 1456   GLUCOSE 314 (H) 09/28/2019 1456   BUN 30 (H) 09/28/2019 1456   CREATININE 2.26 (H) 09/28/2019 1456   CALCIUM 8.4 (L) 09/28/2019 1456   PROT 6.1 (L) 08/02/2018 2332   ALBUMIN 2.9 (L) 08/02/2018 2332   AST 18 08/02/2018 2332   ALT 18 08/02/2018 2332   ALKPHOS 92 08/02/2018 2332   BILITOT 0.8 08/02/2018 2332   GFRNONAA 24 (L) 09/28/2019 1456   GFRAA 28 (L) 09/28/2019 1456     Diabetic Labs (most recent): Lab Results  Component Value Date   HGBA1C 11.4 (A) 12/11/2019   HGBA1C 11.2 (A) 01/22/2019   HGBA1C 13.6 (H) 11/21/2016     Lipid Panel ( most recent) Lipid Panel     Component Value Date/Time   CHOL 162 11/07/2016 0722   TRIG 152 (H) 11/07/2016 0722   HDL 39 (L) 11/07/2016 0722   CHOLHDL 4.2 11/07/2016 0722   VLDL 30 11/07/2016 0722   LDLCALC 93 11/07/2016 0722       Assessment & Plan:   1) Type 1 diabetes mellitus with other circulatory complication ([place])  She presents today for her consultation, accompanied by her daughter, with no meter or logs to review.  Her POCT A1c today is 11.4%.  She does monitor her glucose 2-3 times per day and frequently skips meals due to her sleep pattern.  She admits to the consumption of sugary beverages (soda) but has cut back significantly.  She denies any significant hypoglycemia recently.  She is currently on Lantus 15  units at bedtime and Novolog 8-10 units TID with meals.  - [name] Pulse has currently uncontrolled symptomatic type 2 DM since  49 years of age,  with most recent A1c of 11.4 %.   Recent labs  reviewed.  - I had a long discussion with her about the progressive nature of diabetes and the pathology behind its complications. -her diabetes is complicated by CAD, PVD, CKD, CVA, MI, DKA and she remains at a high risk for more acute and chronic complications which include retinopathy, and neuropathy. These are all discussed in detail with her.  - I have counseled her on diet  and weight management  by adopting a carbohydrate restricted/protein rich diet. Patient is encouraged to switch to  unprocessed or minimally processed complex starch and increased protein intake (animal or plant source), fruits, and vegetables. -  she is advised to stick to a routine mealtimes to eat 3 meals  a day and avoid unnecessary snacks ( to snack only to correct hypoglycemia).   - she acknowledges that there is a room for improvement in her food and drink choices. - Suggestion is made for her to avoid simple carbohydrates  from her diet including Cakes, Sweet Desserts, Ice Cream, Soda (diet and regular), Sweet Tea, Candies, Chips, Cookies, Store Bought Juices, Alcohol in Excess of  1-2 drinks a day, Artificial Sweeteners,  Coffee Creamer, and "Sugar-free" Products. This will help patient to have more stable blood glucose profile and potentially avoid unintended weight gain.  - she will be scheduled with Jearld Fenton, RDN, CDE for diabetes education.  - I have approached her with the following individualized plan to manage  her diabetes and patient agrees:   -She is advised to continue Lantus 15 units SQ daily at bedtime and is advised to change her Novolog to 4-7 units TID with meals if glucose is above 80 and she is eating.  Specific instructions on how to titrate insulin dose based on glucose readings given to patient in writing.  -She is encouraged to start monitoring blood glucose 4 times per day, before meals and before bed.  She is instructed to log her readings (and her insulin administration) on the clinic  sheets provided to bring with her to her follow up appointment in 2 weeks.  She could benefit from CGM device.  Will consider ordering on her follow up visit.  - she is warned not to take insulin without proper monitoring per orders.  - Adjustment parameters are given to her for hypo and hyperglycemia in writing.  - she is encouraged to call clinic for blood glucose levels less than 70 or above 300 mg /dl.  - Specific targets for  A1c;  LDL, HDL,  and Triglycerides were discussed with the patient.  2) Blood Pressure /Hypertension:  Her blood pressure is not controlled to target.  She is advised to continue Carvedilol 6.25 mg po twice daily.  Will defer any med changes to nephrology.  3) Lipids/Hyperlipidemia:  Her most recent lipid panel from 11/07/16 shows controlled LDL of 93 and elevated triglycerides of 152.  She is advised to continue Lipitor 80 mg po daily at bedtime.  Side effects and precautions discussed with her.  4)  Weight/Diet: Her Body mass index is 21.92 kg/m.  -  she is not a candidate for weight loss.  Exercise, and detailed carbohydrates information provided  -  detailed on discharge instructions.  5) Chronic Care/Health Maintenance: -she is on Statin medications and is encouraged to  initiate and continue to follow up with Ophthalmology, Dentist,  Podiatrist at least yearly or according to recommendations, and advised to [place]. I have recommended yearly flu vaccine and pneumonia vaccine at least every 5 years; moderate intensity exercise for up to 150 minutes weekly; and  sleep for at least 7 hours a day.  Smoking cessation instruction/counseling given:  counseled patient on the dangers of tobacco use, advised patient to stop smoking, and reviewed strategies to maximize success   - she is  advised to maintain close follow up with Lindell Spar, MD for primary care needs, as well as her other providers for optimal and coordinated care.   - Time spent in this  patient care: 60 min, of which > 50% was spent in  counseling  her about her diabetes and the rest reviewing her blood glucose logs , discussing her hypoglycemia and hyperglycemia episodes, reviewing her current and  previous labs / studies  ( including abstraction from other facilities) and medications  doses and developing a  long term treatment plan based on the latest standards of care/ guidelines; and documenting her care.    Please refer to Patient Instructions for Blood Glucose Monitoring and Insulin/Medications Dosing Guide"  in media tab for additional information. Please  also refer to " Patient Self Inventory" in the Media  tab for reviewed elements of pertinent patient history.  [name] participated in the discussions, expressed understanding, and voiced agreement with the above plans.  All questions were answered to her satisfaction. she is encouraged to contact clinic should she have any questions or concerns prior to her return visit.   Follow up plan: - Return in about 2 weeks (around 12/25/2019) for Diabetes follow up, Bring glucometer and logs.  Rayetta Pigg, [facility] [address] [place], [place] [address] Phone: [phone] Fax: [phone]  12/11/2019, 5:06 PM

## 2019-12-11 NOTE — Patient Instructions (Signed)

## 2019-12-25 ENCOUNTER — Ambulatory Visit: Payer: Medicaid Other | Admitting: Nurse Practitioner

## 2019-12-26 NOTE — H&P (Signed)
Surgical History & Physical  Patient Name: [name] DOB: [birth year]  Surgery: Cataract extraction with intraocular lens implant phacoemulsification; Left Eye  Surgeon: Baruch Goldmann MD Surgery Date:  01/09/2020 Pre-Op Date:  12/24/2019  HPI: A 77 Yr. old female patient Pt referred by Dr. Jorja Loa for cataract evaluation. The patient complains of difficulty when recognizing people, which began many years ago. Both eyes are affected. The episode is constant. The condition's severity is worsening. The complaint is associated with glare and light sensitivity. Symptoms are negatively affecting pt's quality of life. Pt denies any increase in floaters or flashes of light. Pt states vision decrease dramatically OS after stroke in 2019. HPI was performed by Baruch Goldmann .  Medical History: Cataracts mild NPDR OD Diabetes - DM Type 1 High Blood Pressure Stage 4 Kidney Failure Stroke  Review of Systems Negative Allergic/Immunologic Negative Cardiovascular Negative Constitutional Negative Ear, Nose, Mouth & Throat Negative Endocrine Negative Eyes Negative Gastrointestinal Negative Genitourinary Negative Hemotologic/Lymphatic Negative Integumentary Negative Musculoskeletal Negative Neurological Negative Psychiatry Negative Respiratory  Social   Current every day smoker   Medication Lantus, NovoLog, Eliquis, Carvedilol, Atorvastatin, Oxycodone,   Sx/Procedures Heart stent, Leg Amputation below knee, Metal plate left wrist,   Drug Allergies   NKDA  History & Physical: Heent:  Cataract, Left eye NECK: supple without bruits LUNGS: lungs clear to auscultation CV: regular rate and rhythm Abdomen: soft and non-tender  Impression & Plan: Assessment: 1.  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  COMBINED FORMS AGE RELATED CATARACT; Right Eye (H25.811) 3.  DM Type 1; Right Mild Without ME (E10.3291) 4.  BLEPHARITIS; Right Upper Lid, Right Lower Lid,  Left Upper Lid, Left Lower Lid (H01.001, H01.002,H01.004,H01.005) 5.  Pinguecula; Both Eyes (H11.153) 6.  BLINDNESS RIGHT EYE CATEGORY 5, LOW VISION LEFT EYE (H54.115)  Plan: 1.  Cataract accounts for the patient's decreased vision. This visual impairment is not correctable with a tolerable change in glasses or contact lenses. Cataract surgery with an implantation of a new lens should significantly improve the visual and functional status of the patient. Discussed all risks, benefits, alternatives, and potential complications. Discussed the procedures and recovery. Patient desires to have surgery. A-scan ordered and performed today for intra-ocular lens calculations. The surgery will be performed in order to improve vision for driving, reading, and for eye examinations. Recommend phacoemulsification with intra-ocular lens. Recommend Dextenza for post-operative pain and inflammation. Left Eye. Dilates poorly - shugacaine by protocol. Vision [place]. [place]. May need to convert to general based on patient's anxiety. 2.  Not visually significant - monitor. 3.  Monitor. Call with any worsening vision, pain, or any other concerns. 4.  Recommend regular lid cleaning. 5.  Observe; Artificial tears as needed for irritation. 6.  Likely secondary to stroke. Monocular precautions discussed, including wearing shatterproof lenses.

## 2020-01-04 ENCOUNTER — Ambulatory Visit (HOSPITAL_COMMUNITY)
Admission: RE | Admit: 2020-01-04 | Discharge: 2020-01-04 | Disposition: A | Discharge status: Home / Self Care | Payer: Medicaid Other | Source: Ambulatory Visit | Attending: Nephrology | Admitting: Nephrology

## 2020-01-04 ENCOUNTER — Other Ambulatory Visit: Payer: Self-pay

## 2020-01-04 DIAGNOSIS — N189 Chronic kidney disease, unspecified: Secondary | ICD-10-CM | POA: Insufficient documentation

## 2020-01-04 NOTE — Patient Instructions (Signed)
RETINA [name]  01/04/2020     @PREFPERIOPPHARMACY @   Your procedure is scheduled on  01/09/2020.  Report to [facility] at  1030  A.M.  Call this number if you have problems the morning of surgery:  [phone]   Remember:  Do not eat or drink after midnight.                          Take these medicines the morning of surgery with A SIP OF WATER Carvedilol, zofran(if needed), oxycodone(if needed). Take 12 units of your glargine the night before your procedure. If your glucose is over 220 take 1/2 of your usual correction dose.    Do not wear jewelry, make-up or nail polish.  Do not wear lotions, powders, or perfumes. Please wear deodorant and brush your teeth.  Do not shave 48 hours prior to surgery.  Men may shave face and neck.  Do not bring valuables to the hospital.  [facility] is not responsible for any belongings or valuables.  Contacts, dentures or bridgework may not be worn into surgery.  Leave your suitcase in the car.  After surgery it may be brought to your room.  For patients admitted to the hospital, discharge time will be determined by your treatment team.  Patients discharged the day of surgery will not be allowed to drive home.   Name and phone number of your driver:   family Special instructions:  DO NOT smoke the morning of your procedure.  Please read over the following fact sheets that you were given. Anesthesia Post-op Instructions and Care and Recovery After Surgery      Cataract Surgery, Care After This sheet gives you information about how to care for yourself after your procedure. Your health care provider may also give you more specific instructions. If you have problems or questions, contact your health care provider. What can I expect after the procedure? After the procedure, it is common to have:  Itching.  Discomfort.  Fluid discharge.  Sensitivity to light and to touch.  Bruising in or around the eye.  Mild blurred  vision. Follow these instructions at home: Eye care   Do not touch or rub your eyes.  Protect your eyes as told by your health care provider. You may be told to wear a protective eye shield or sunglasses.  Do not put a contact lens into the affected eye or eyes until your health care provider approves.  Keep the area around your eye clean and dry: ? Avoid swimming. ? Do not allow water to hit you directly in the face while showering. ? Keep soap and shampoo out of your eyes.  Check your eye every day for signs of infection. Watch for: ? Redness, swelling, or pain. ? Fluid, blood, or pus. ? Warmth. ? A bad smell. ? Vision that is getting worse. ? Sensitivity that is getting worse. Activity  Do not drive for 24 hours if you were given a sedative during your procedure.  Avoid strenuous activities, such as playing contact sports, for as long as told by your health care provider.  Do not drive or use heavy machinery until your health care provider approves.  Do not bend or lift heavy objects. Bending increases pressure in the eye. You can walk, climb stairs, and do light household chores.  Ask your health care provider when you can return to work. If you work in a dusty environment,  you may be advised to wear protective eyewear for a period of time. General instructions  Take or apply over-the-counter and prescription medicines only as told by your health care provider. This includes eye drops.  Keep all follow-up visits as told by your health care provider. This is important. Contact a health care provider if:  You have increased bruising around your eye.  You have pain that is not helped with medicine.  You have a fever.  You have redness, swelling, or pain in your eye.  You have fluid, blood, or pus coming from your incision.  Your vision gets worse.  Your sensitivity to light gets worse. Get help right away if:  You have sudden loss of vision.  You see flashes  of light or spots (floaters).  You have severe eye pain.  You develop nausea or vomiting. Summary  After your procedure, it is common to have itching, discomfort, bruising, fluid discharge, or sensitivity to light.  Follow instructions from your health care provider about caring for your eye after the procedure.  Do not rub your eye after the procedure. You may need to wear eye protection or sunglasses. Do not wear contact lenses. Keep the area around your eye clean and dry.  Avoid activities that require a lot of effort. These include playing sports and lifting heavy objects.  Contact a health care provider if you have increased bruising, pain that does not go away, or a fever. Get help right away if you suddenly lose your vision, see flashes of light or spots, or have severe pain in the eye. This information is not intended to replace advice given to you by your health care provider. Make sure you discuss any questions you have with your health care provider. Document Revised: 10/31/2018 Document Reviewed: 07/04/2017 Elsevier Patient Education  [place].

## 2020-01-07 ENCOUNTER — Other Ambulatory Visit (HOSPITAL_COMMUNITY)
Admission: RE | Admit: 2020-01-07 | Discharge: 2020-01-07 | Disposition: A | Discharge status: Home / Self Care | Payer: Medicaid Other | Source: Ambulatory Visit | Attending: Ophthalmology | Admitting: Ophthalmology

## 2020-01-07 ENCOUNTER — Other Ambulatory Visit: Payer: Self-pay

## 2020-01-07 ENCOUNTER — Encounter (HOSPITAL_COMMUNITY): Payer: Self-pay

## 2020-01-07 ENCOUNTER — Encounter (HOSPITAL_COMMUNITY)
Admission: RE | Admit: 2020-01-07 | Discharge: 2020-01-07 | Disposition: A | Discharge status: Home / Self Care | Payer: Medicaid Other | Source: Ambulatory Visit | Attending: Ophthalmology | Admitting: Ophthalmology

## 2020-01-07 DIAGNOSIS — Z20822 Contact with and (suspected) exposure to covid-19: Secondary | ICD-10-CM | POA: Insufficient documentation

## 2020-01-07 DIAGNOSIS — Z01812 Encounter for preprocedural laboratory examination: Secondary | ICD-10-CM | POA: Diagnosis not present

## 2020-01-07 LAB — CBC WITH DIFFERENTIAL/PLATELET
Abs Immature Granulocytes: 0.02 10*3/uL (ref 0.00–0.07)
Basophils Absolute: 0.1 10*3/uL (ref 0.0–0.1)
Basophils Relative: 1 %
Eosinophils Absolute: 0.3 10*3/uL (ref 0.0–0.5)
Eosinophils Relative: 3 %
HCT: 43 % (ref 36.0–46.0)
Hemoglobin: 13.9 g/dL (ref 12.0–15.0)
Immature Granulocytes: 0 %
Lymphocytes Relative: 38 %
Lymphs Abs: 3.3 10*3/uL (ref 0.7–4.0)
MCH: 30.5 pg (ref 26.0–34.0)
MCHC: 32.3 g/dL (ref 30.0–36.0)
MCV: 94.3 fL (ref 80.0–100.0)
Monocytes Absolute: 0.7 10*3/uL (ref 0.1–1.0)
Monocytes Relative: 8 %
Neutro Abs: 4.3 10*3/uL (ref 1.7–7.7)
Neutrophils Relative %: 50 %
Platelets: 380 10*3/uL (ref 150–400)
RBC: 4.56 MIL/uL (ref 3.87–5.11)
RDW: 13.4 % (ref 11.5–15.5)
WBC: 8.7 10*3/uL (ref 4.0–10.5)
nRBC: 0 % (ref 0.0–0.2)

## 2020-01-07 LAB — BASIC METABOLIC PANEL
Anion gap: 7 (ref 5–15)
BUN: 33 mg/dL — ABNORMAL HIGH (ref 6–20)
CO2: 23 mmol/L (ref 22–32)
Calcium: 8.4 mg/dL — ABNORMAL LOW (ref 8.9–10.3)
Chloride: 105 mmol/L (ref 98–111)
Creatinine, Ser: 2.02 mg/dL — ABNORMAL HIGH (ref 0.44–1.00)
GFR, Estimated: 30 mL/min — ABNORMAL LOW (ref >60)
Glucose, Bld: 262 mg/dL — ABNORMAL HIGH (ref 70–99)
Potassium: 4.9 mmol/L (ref 3.5–5.1)
Sodium: 135 mmol/L (ref 135–145)

## 2020-01-07 LAB — HEMOGLOBIN A1C
Hgb A1c MFr Bld: 10.5 % — ABNORMAL HIGH (ref 4.8–5.6)
Mean Plasma Glucose: 254.65 mg/dL

## 2020-01-07 LAB — SARS CORONAVIRUS 2 (TAT 6-24 HRS): SARS Coronavirus 2: NEGATIVE

## 2020-01-08 MED ORDER — CYCLOPENTOLATE-PHENYLEPHRINE 0.2-1 % OP SOLN: 1.0000 [drp] | OPHTHALMIC | Status: DC | PRN

## 2020-01-09 ENCOUNTER — Ambulatory Visit: Payer: Medicaid Other | Admitting: Internal Medicine

## 2020-01-09 ENCOUNTER — Encounter (HOSPITAL_COMMUNITY)
Admission: RE | Disposition: A | Discharge status: Home / Self Care | Payer: Self-pay | Source: Home / Self Care | Attending: Ophthalmology

## 2020-01-09 ENCOUNTER — Ambulatory Visit (HOSPITAL_COMMUNITY): Payer: Medicaid Other | Admitting: Anesthesiology

## 2020-01-09 ENCOUNTER — Encounter (HOSPITAL_COMMUNITY): Payer: Self-pay | Admitting: Ophthalmology

## 2020-01-09 ENCOUNTER — Ambulatory Visit (HOSPITAL_COMMUNITY)
Admission: RE | Admit: 2020-01-09 | Discharge: 2020-01-09 | Disposition: A | Discharge status: Home / Self Care | Payer: Medicaid Other | Attending: Ophthalmology | Admitting: Ophthalmology

## 2020-01-09 DIAGNOSIS — H2522 Age-related cataract, morgagnian type, left eye: Secondary | ICD-10-CM | POA: Insufficient documentation

## 2020-01-09 DIAGNOSIS — Z8673 Personal history of transient ischemic attack (TIA), and cerebral infarction without residual deficits: Secondary | ICD-10-CM | POA: Diagnosis not present

## 2020-01-09 DIAGNOSIS — Z89519 Acquired absence of unspecified leg below knee: Secondary | ICD-10-CM | POA: Diagnosis not present

## 2020-01-09 DIAGNOSIS — H0100B Unspecified blepharitis left eye, upper and lower eyelids: Secondary | ICD-10-CM | POA: Insufficient documentation

## 2020-01-09 DIAGNOSIS — Z955 Presence of coronary angioplasty implant and graft: Secondary | ICD-10-CM | POA: Insufficient documentation

## 2020-01-09 DIAGNOSIS — Z7901 Long term (current) use of anticoagulants: Secondary | ICD-10-CM | POA: Diagnosis not present

## 2020-01-09 DIAGNOSIS — Z79899 Other long term (current) drug therapy: Secondary | ICD-10-CM | POA: Insufficient documentation

## 2020-01-09 DIAGNOSIS — F17200 Nicotine dependence, unspecified, uncomplicated: Secondary | ICD-10-CM | POA: Diagnosis not present

## 2020-01-09 DIAGNOSIS — Z794 Long term (current) use of insulin: Secondary | ICD-10-CM | POA: Diagnosis not present

## 2020-01-09 DIAGNOSIS — E1036 Type 1 diabetes mellitus with diabetic cataract: Secondary | ICD-10-CM | POA: Insufficient documentation

## 2020-01-09 DIAGNOSIS — H25811 Combined forms of age-related cataract, right eye: Secondary | ICD-10-CM | POA: Insufficient documentation

## 2020-01-09 DIAGNOSIS — H0100A Unspecified blepharitis right eye, upper and lower eyelids: Secondary | ICD-10-CM | POA: Insufficient documentation

## 2020-01-09 DIAGNOSIS — H5461 Unqualified visual loss, right eye, normal vision left eye: Secondary | ICD-10-CM | POA: Diagnosis not present

## 2020-01-09 DIAGNOSIS — H11153 Pinguecula, bilateral: Secondary | ICD-10-CM | POA: Insufficient documentation

## 2020-01-09 HISTORY — PX: CATARACT EXTRACTION W/PHACO: SHX586

## 2020-01-09 LAB — GLUCOSE, CAPILLARY: Glucose-Capillary: 203 mg/dL — ABNORMAL HIGH (ref 70–99)

## 2020-01-09 SURGERY — PHACOEMULSIFICATION, CATARACT, WITH IOL INSERTION
Anesthesia: Monitor Anesthesia Care | Site: Eye | Laterality: Left

## 2020-01-09 MED ORDER — FENTANYL CITRATE (PF) 100 MCG/2ML IJ SOLN: 25.0000 ug | INTRAMUSCULAR | Status: DC | PRN

## 2020-01-09 MED ORDER — FENTANYL CITRATE (PF) 100 MCG/2ML IJ SOLN
INTRAMUSCULAR | Status: AC
  Filled 2020-01-09: qty 2

## 2020-01-09 MED ORDER — SODIUM HYALURONATE 23 MG/ML IO SOLN
INTRAOCULAR | Status: DC | PRN
  Administered 2020-01-09: 0.6 mL via INTRAOCULAR

## 2020-01-09 MED ORDER — LACTATED RINGERS IV SOLN
INTRAVENOUS | Status: DC
  Administered 2020-01-09: 11:00:00 1000 mL via INTRAVENOUS

## 2020-01-09 MED ORDER — PHENYLEPHRINE-KETOROLAC 1-0.3 % IO SOLN
INTRAOCULAR | Status: AC
  Filled 2020-01-09: qty 4

## 2020-01-09 MED ORDER — MIDAZOLAM HCL 2 MG/2ML IJ SOLN
INTRAMUSCULAR | Status: AC
  Filled 2020-01-09: qty 2

## 2020-01-09 MED ORDER — PHENYLEPHRINE HCL 2.5 % OP SOLN
1.0000 [drp] | OPHTHALMIC | Status: AC | PRN
  Administered 2020-01-09 (×3): 1 [drp] via OPHTHALMIC

## 2020-01-09 MED ORDER — MIDAZOLAM HCL 2 MG/2ML IJ SOLN
INTRAMUSCULAR | Status: DC | PRN
  Administered 2020-01-09 (×2): .5 mg via INTRAVENOUS

## 2020-01-09 MED ORDER — TRYPAN BLUE 0.06 % OP SOLN
OPHTHALMIC | Status: DC | PRN
  Administered 2020-01-09: 0.5 mL via INTRAOCULAR

## 2020-01-09 MED ORDER — NEOMYCIN-POLYMYXIN-DEXAMETH 3.5-10000-0.1 OP SUSP
OPHTHALMIC | Status: DC | PRN
  Administered 2020-01-09: 1 [drp] via OPHTHALMIC

## 2020-01-09 MED ORDER — TROPICAMIDE 1 % OP SOLN
1.0000 [drp] | OPHTHALMIC | Status: AC
  Administered 2020-01-09 (×3): 1 [drp] via OPHTHALMIC

## 2020-01-09 MED ORDER — PROPOFOL 10 MG/ML IV BOLUS
INTRAVENOUS | Status: AC
  Filled 2020-01-09: qty 20

## 2020-01-09 MED ORDER — TRYPAN BLUE 0.06 % OP SOLN
OPHTHALMIC | Status: AC
  Filled 2020-01-09: qty 0.5

## 2020-01-09 MED ORDER — PROVISC 10 MG/ML IO SOLN
INTRAOCULAR | Status: DC | PRN
  Administered 2020-01-09: 0.85 mL via INTRAOCULAR

## 2020-01-09 MED ORDER — TETRACAINE HCL 0.5 % OP SOLN
1.0000 [drp] | OPHTHALMIC | Status: AC | PRN
  Administered 2020-01-09 (×3): 1 [drp] via OPHTHALMIC

## 2020-01-09 MED ORDER — BSS IO SOLN
INTRAOCULAR | Status: DC | PRN
  Administered 2020-01-09: 15 mL via INTRAOCULAR

## 2020-01-09 MED ORDER — EPINEPHRINE PF 1 MG/ML IJ SOLN
INTRAMUSCULAR | Status: AC
  Filled 2020-01-09: qty 1

## 2020-01-09 MED ORDER — LIDOCAINE HCL 3.5 % OP GEL
1.0000 "application " | Freq: Once | OPHTHALMIC | Status: AC
  Administered 2020-01-09: 1 via OPHTHALMIC

## 2020-01-09 MED ORDER — STERILE WATER FOR IRRIGATION IR SOLN
Status: DC | PRN
  Administered 2020-01-09: 250 mL

## 2020-01-09 MED ORDER — LIDOCAINE HCL (PF) 1 % IJ SOLN
INTRAOCULAR | Status: DC | PRN
  Administered 2020-01-09: 12:00:00 1 mL via OPHTHALMIC

## 2020-01-09 MED ORDER — PHENYLEPHRINE-KETOROLAC 1-0.3 % IO SOLN
INTRAOCULAR | Status: DC | PRN
  Administered 2020-01-09: 12:00:00 500 mL via OPHTHALMIC

## 2020-01-09 MED ORDER — MIDAZOLAM HCL 2 MG/2ML IJ SOLN
2.0000 mg | Freq: Once | INTRAMUSCULAR | Status: AC
  Administered 2020-01-09: 11:00:00 2 mg via INTRAVENOUS

## 2020-01-09 MED ORDER — POVIDONE-IODINE 5 % OP SOLN
OPHTHALMIC | Status: DC | PRN
  Administered 2020-01-09: 1 via OPHTHALMIC

## 2020-01-09 MED ORDER — FENTANYL CITRATE (PF) 100 MCG/2ML IJ SOLN
INTRAMUSCULAR | Status: DC | PRN
  Administered 2020-01-09 (×2): 25 ug via INTRAVENOUS

## 2020-01-09 SURGICAL SUPPLY — 13 items
CLOTH BEACON ORANGE TIMEOUT ST (SAFETY) ×2 IMPLANT
EYE SHIELD UNIVERSAL CLEAR (GAUZE/BANDAGES/DRESSINGS) ×2 IMPLANT
GLOVE BIOGEL PI IND STRL 7.0 (GLOVE) IMPLANT
GLOVE BIOGEL PI INDICATOR 7.0 (GLOVE) ×4
NDL HYPO 18GX1.5 BLUNT FILL (NEEDLE) IMPLANT
NEEDLE HYPO 18GX1.5 BLUNT FILL (NEEDLE) ×3 IMPLANT
PAD ARMBOARD 7.5X6 YLW CONV (MISCELLANEOUS) ×2 IMPLANT
SYR TB 1ML LL NO SAFETY (SYRINGE) ×2 IMPLANT
TAPE SURG TRANSPORE 1 IN (GAUZE/BANDAGES/DRESSINGS) IMPLANT
TAPE SURGICAL TRANSPORE 1 IN (GAUZE/BANDAGES/DRESSINGS) ×3
TECNIS IOL (Intraocular Lens) ×2 IMPLANT
VISCOELASTIC ADDITIONAL (OPHTHALMIC RELATED) IMPLANT
WATER STERILE IRR 250ML POUR (IV SOLUTION) ×2 IMPLANT

## 2020-01-09 NOTE — Addendum Note (Signed)
Addendum  created 01/09/20 1256 by Tacy Learn, CRNA   Charge Capture section accepted

## 2020-01-09 NOTE — Anesthesia Postprocedure Evaluation (Signed)
Anesthesia Post Note  Patient: [name]  Procedure(s) Performed: CATARACT EXTRACTION PHACO AND INTRAOCULAR LENS PLACEMENT (IOC) (Left Eye)  Patient location during evaluation: Phase II Anesthesia Type: MAC Level of consciousness: awake, oriented, awake and alert and patient cooperative Pain management: pain level controlled Vital Signs Assessment: post-procedure vital signs reviewed and stable Respiratory status: spontaneous breathing, respiratory function stable and nonlabored ventilation Cardiovascular status: blood pressure returned to baseline and stable Postop Assessment: no headache and no backache Anesthetic complications: no   No complications documented.   Last Vitals:  Vitals:   01/09/20 1044  BP: (!) 161/75  Pulse: 75  Resp: 20  Temp: 36.8 C  SpO2: 100%    Last Pain:  Vitals:   01/09/20 1044  TempSrc: Oral  PainSc: 0-No pain                 Tacy Learn

## 2020-01-09 NOTE — Interval H&P Note (Signed)
History and Physical Interval Note:  01/09/2020 11:27 AM  [name]  has presented today for surgery, with the diagnosis of Nuclear sclerotic cataract - Left eye.  The various methods of treatment have been discussed with the patient and family. After consideration of risks, benefits and other options for treatment, the patient has consented to  Procedure(s) with comments: CATARACT EXTRACTION PHACO AND INTRAOCULAR LENS PLACEMENT ([place]) (Left) - left, pt needs to last of the day, may need general anesthesia as a surgical intervention.  The patient's history has been reviewed, patient examined, no change in status, stable for surgery.  I have reviewed the patient's chart and labs.  Questions were answered to the patient's satisfaction.     Baruch Goldmann

## 2020-01-09 NOTE — Discharge Instructions (Signed)
Please discharge patient when stable, will follow up today with Dr. Phelan Schadt at the [facility] [place] office immediately following discharge.  Leave shield in place until visit.  All paperwork with discharge instructions will be given at the office.  [facility] Address:  [address]  [place], [place] [address]             Monitored Anesthesia Care, Care After These instructions provide you with information about caring for yourself after your procedure. Your health care provider may also give you more specific instructions. Your treatment has been planned according to current medical practices, but problems sometimes occur. Call your health care provider if you have any problems or questions after your procedure. What can I expect after the procedure? After your procedure, you may:  Feel sleepy for several hours.  Feel clumsy and have poor balance for several hours.  Feel forgetful about what happened after the procedure.  Have poor judgment for several hours.  Feel nauseous or vomit.  Have a sore throat if you had a breathing tube during the procedure. Follow these instructions at home: For at least 24 hours after the procedure:      Have a responsible adult stay with you. It is important to have someone help care for you until you are awake and alert.  Rest as needed.  Do not: ? Participate in activities in which you could fall or become injured. ? Drive. ? Use heavy machinery. ? Drink alcohol. ? Take sleeping pills or medicines that cause drowsiness. ? Make important decisions or sign legal documents. ? Take care of children on your own. Eating and drinking  Follow the diet that is recommended by your health care provider.  If you vomit, drink water, juice, or soup when you can drink without vomiting.  Make sure you have little or no nausea before eating solid foods. General instructions  Take over-the-counter and  prescription medicines only as told by your health care provider.  If you have sleep apnea, surgery and certain medicines can increase your risk for breathing problems. Follow instructions from your health care provider about wearing your sleep device: ? Anytime you are sleeping, including during daytime naps. ? While taking prescription pain medicines, sleeping medicines, or medicines that make you drowsy.  If you smoke, do not smoke without supervision.  Keep all follow-up visits as told by your health care provider. This is important. Contact a health care provider if:  You keep feeling nauseous or you keep vomiting.  You feel light-headed.  You develop a rash.  You have a fever. Get help right away if:  You have trouble breathing. Summary  For several hours after your procedure, you may feel sleepy and have poor judgment.  Have a responsible adult stay with you for at least 24 hours or until you are awake and alert. This information is not intended to replace advice given to you by your health care provider. Make sure you discuss any questions you have with your health care provider. Document Revised: 04/04/2017 Document Reviewed: 04/27/2015 Elsevier Patient Education  2020 Elsevier Inc.  

## 2020-01-09 NOTE — Transfer of Care (Signed)
Immediate Anesthesia Transfer of Care Note  Patient: [name]  Procedure(s) Performed: CATARACT EXTRACTION PHACO AND INTRAOCULAR LENS PLACEMENT (IOC) (Left Eye)  Patient Location: Short Stay  Anesthesia Type:MAC  Level of Consciousness: awake, alert , oriented and patient cooperative  Airway & Oxygen Therapy: Patient Spontanous Breathing  Post-op Assessment: Report given to RN, Post -op Vital signs reviewed and stable and Patient moving all extremities  Post vital signs: Reviewed and stable  Last Vitals:  Vitals Value Taken Time  BP    Temp    Pulse    Resp    SpO2      Last Pain:  Vitals:   01/09/20 1044  TempSrc: Oral  PainSc: 0-No pain      Patients Stated Pain Goal: 5 (83/81/84 0375)  Complications: No complications documented.

## 2020-01-09 NOTE — Op Note (Signed)
Date of procedure: 01/09/20  Pre-operative diagnosis: Mature, Visually significant age-related cataract, Left Eye (H25.22)  Post-operative diagnosis: Mature Visually significant age-related cataract, Left Eye  Procedure: Complex Removal of cataract via phacoemulsification and insertion of intra-ocular lens AMO DCB00 +26.0D into the capsular bag of the Left Eye  Attending surgeon: Gerda Diss. Ada Woodbury, MD, MA  Anesthesia: MAC, Topical [place]  Complications: None  Estimated Blood Loss: <54m (minimal)  Specimens: None  Implants: As above  Indications:  Mature Visually significant age-related cataract, Left Eye  Procedure:  The patient was seen and identified in the pre-operative area. The operative eye was identified and dilated.  The operative eye was marked.  Topical anesthesia was administered to the operative eye.     The patient was then to the operative suite and placed in the supine position.  A timeout was performed confirming the patient, procedure to be performed, and all other relevant information.   The patient's face was prepped and draped in the usual fashion for intra-ocular surgery.  A lid speculum was placed into the operative eye and the surgical microscope moved into place and focused.  A lack of red reflex due to a mature cataract was confirmed.  An inferotemporal paracentesis was created using a 20 gauge paracentesis blade.  Vision blue was injected into the anterior chamber.  Shugarcaine was injected into the anterior chamber.  Viscoelastic was injected into the anterior chamber.  A temporal clear-corneal main wound incision was created using a 2.479mmicrokeratome.  A continuous curvilinear capsulorrhexis was initiated using an irrigating cystitome and completed using capsulorrhexis forceps.  Hydrodissection and hydrodeliniation were performed.  Viscoelastic was injected into the anterior chamber.  A phacoemulsification handpiece and a chopper as a second instrument were used  to remove the nucleus and epinucleus. The irrigation/aspiration handpiece was used to remove any remaining cortical material.   The capsular bag was reinflated with viscoelastic, checked, and found to be intact. The intraocular lens was inserted into the capsular bag and dialed into place using a kuglen hook.  The irrigation/aspiration handpiece was used to remove any remaining viscoelastic.  The clear corneal wound and paracentesis wounds were then hydrated and checked with Weck-Cels to be watertight.  The lid-speculum and drape was removed, and the patient's face was cleaned with a wet and dry 4x4.  Maxitrol was instilled in the eye before a clear shield was taped over the eye. The patient was taken to the post-operative care unit in good condition, having tolerated the procedure well.  Post-Op Instructions: [facility] and will receive all other orders and instructions.

## 2020-01-09 NOTE — Anesthesia Preprocedure Evaluation (Addendum)
Anesthesia Evaluation  Patient identified by MRN, date of birth, ID band Patient awake    Reviewed: Allergy & Precautions, NPO status , Patient's Chart, lab work & pertinent test results  Airway Mallampati: III  TM Distance: >3 FB Neck ROM: Full    Dental  (+) Edentulous Upper, Edentulous Lower   Pulmonary Current Smoker and Patient abstained from smoking.,    Pulmonary exam normal breath sounds clear to auscultation       Cardiovascular Exercise Tolerance: Poor + CAD, + Past MI, + Cardiac Stents and + Peripheral Vascular Disease  Normal cardiovascular exam Rhythm:Regular Rate:Normal  1. LV apex is trabeculated. Left ventricular ejection fraction, by estimation, is 45 to 50%. The left ventricle has mildly decreased function. The left ventricle demonstrates regional wall motion abnormalities (see scoring diagram/findings for  description). There is moderate left ventricular hypertrophy of the septal  segment. Left ventricular diastolic parameters are consistent with Grade I  diastolic dysfunction (impaired relaxation).  2. Right ventricular systolic function is normal. The right ventricular size is normal.  3. The mitral valve is abnormal. No evidence of mitral valve regurgitation.  4. The aortic valve is tricuspid. Aortic valve regurgitation is not visualized. No aortic stenosis is present.  5. The inferior vena cava is normal in size with greater than 50% respiratory variability, suggesting right atrial pressure of 3 mmHg.    Neuro/Psych  Headaches, PSYCHIATRIC DISORDERS Anxiety Depression CVA, Residual Symptoms    GI/Hepatic negative GI ROS, Neg liver ROS,   Endo/Other  diabetes, Poorly Controlled, Type 1, Insulin Dependent  Renal/GU Renal InsufficiencyRenal disease     Musculoskeletal negative musculoskeletal ROS (+)   Abdominal   Peds  Hematology   Anesthesia Other Findings   Reproductive/Obstetrics negative  OB ROS                           Anesthesia Physical Anesthesia Plan  ASA: III  Anesthesia Plan: MAC and General   Post-op Pain Management:    Induction:   PONV Risk Score and Plan: TIVA  Airway Management Planned: Nasal Cannula, Natural Airway and LMA  Additional Equipment:   Intra-op Plan:   Post-operative Plan: Extubation in OR  Informed Consent: I have reviewed the patients History and Physical, chart, labs and discussed the procedure including the risks, benefits and alternatives for the proposed anesthesia with the patient or authorized representative who has indicated his/her understanding and acceptance.       Plan Discussed with: CRNA and Surgeon  Anesthesia Plan Comments: (Possible GA with airway, risk of kidney function getting worse was explained.)       Anesthesia Quick Evaluation

## 2020-01-10 ENCOUNTER — Encounter (HOSPITAL_COMMUNITY): Payer: Self-pay | Admitting: Ophthalmology

## 2020-01-22 ENCOUNTER — Other Ambulatory Visit: Payer: Self-pay | Admitting: Ophthalmology

## 2020-01-23 ENCOUNTER — Encounter (HOSPITAL_COMMUNITY): Payer: Self-pay | Admitting: Ophthalmology

## 2020-01-23 ENCOUNTER — Encounter (HOSPITAL_COMMUNITY): Payer: Self-pay | Admitting: Vascular Surgery

## 2020-01-23 NOTE — Progress Notes (Signed)
Anesthesia Chart Review: SAME DAY WORK-UP  Case: [id number] Date/Time: 01/24/20 1415   Procedures:      PARS PLANA VITRECTOMY WITH 25 GAUGE (Left Eye)     PHOTOCOAGULATION WITH LASER (Left Eye)     AVASTIN INJECTION (Left Eye)   Anesthesia type: Monitor Anesthesia Care   Pre-op diagnosis: Vitreous Hemorrhage Left Eye   Location: [facility] ROOM 08 / [facility]   Surgeons: Sherlynn Stalls, MD      DISCUSSION: Patient is a 50 year old female scheduled for the above procedure. She is s/p complex removal of left cataract on 01/09/20 by Baruch Goldmann, MD.   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  Last evaluated by Rozann Lesches, MD was on 03/01/19 for CAD and PAD follow-up. She had not been seen by cardiology since 2018. No obvious anginal symptoms. Echo ordered to re-evaluate her LVEF and showed some improvement from 30-35% to 45-50%. She does not currently have a follow-up visit scheduled. Next visit with PCP is scheduled for 01/30/20. He notes she is on Eliquis for hypercoagulable state.    Historically with uncontrolled DM. Re-established with endocrinology on 12/11/19. Lantus continued and Novolog meal coverage increased. Glucose monitoring and diet compliance encouraged. She was  also referred to DM Education. A1c 10.5% on 01/07/20, down from 11.4 on 12/11/19.     She is a same day work-up, so labs on the day of surgery. Cr 2.02 on 01/07/20, down from 2.26 on 09/28/19 and was 1.63 on 08/02/18.  If presurgical COVID-19 test not done, then she will need on arrival. PAT RN to follow-up regarding Eliquis instructions given to patient. UPDATE: Surgery to be postponed. Patient not wanting to proceed at this time.    VS:  BP Readings from Last 3 Encounters:  01/09/20 128/62  01/07/20 (!) 155/60  12/11/19 (!) 149/83   Pulse Readings from Last 3 Encounters:  01/09/20 74  01/07/20 77  12/11/19 80    PROVIDERS: Lindell Spar, MD is listed as PCP. Previously she saw Sinda Du, MD prior to his retirement. Rozann Lesches, MD is cardiologist [name], MD is endocrinologist, last visit 2013, but recently evaluated by Rayetta Pigg, NP on 12/11/19 for uncontrolled DM.    LABS: For day of surgery. Last lab results include: Lab Results  Component Value Date   WBC 8.7 01/07/2020   HGB 13.9 01/07/2020   HCT 43.0 01/07/2020   PLT 380 01/07/2020   GLUCOSE 262 (H) 01/07/2020   ALT 18 08/02/2018   AST 18 08/02/2018   NA 135 01/07/2020   K 4.9 01/07/2020   CL 105 01/07/2020   CREATININE 2.02 (H) 01/07/2020   BUN 33 (H) 01/07/2020   CO2 23 01/07/2020   HGBA1C 10.5 (H) 01/07/2020      IMAGES:   EKG: 02/28/19: Sinus tachycardia at 1 1 bpm Right atrial enlargement Anterior septal infarct, age undetermined T wave abnormality, consider lateral ischemia.  CV: Echo 03/16/19: IMPRESSIONS  1. LV apex is trabeculated. Left ventricular ejection fraction, by  estimation, is 45 to 50%. The left ventricle has mildly decreased  function. The left ventricle demonstrates regional wall motion  abnormalities (see scoring diagram/findings for  description). There is moderate left ventricular hypertrophy of the septal  segment. Left ventricular diastolic  parameters are consistent with Grade I  diastolic dysfunction (impaired relaxation).  2. Right ventricular systolic function is normal. The right ventricular  size is normal.  3. The mitral valve is abnormal. No evidence of mitral valve  regurgitation.  4. The aortic valve is tricuspid. Aortic valve regurgitation is not  visualized. No aortic stenosis is present.  5. The inferior vena cava is normal in size with greater than 50%  respiratory variability, suggesting right atrial pressure of 3 mmHg.  (Comparison 11/21/16: mild LVH, LVEF 30-35%, anteroseptal hypokinesis, apical akinesis, grade 1 DD, mild MR; LVEFL 40-45% 10/01/12)  Carotid US 11/20/16: Final Interpretation:  Right Carotid: There is no evicence of a significant stenosis in the right  ICA.  Left Carotid: The left ICA appears occluded.   Cardiac cath 09/30/12: Final Conclusions:   1. 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 Moderate disease at the ostium of the ramus intermediate branch. 2. Mild to moderate left ventricular dysfunction. EF 40-45%. 3. Successful stenting of the Roxanol to mid LAD with drug-eluting stents.     Past Medical History:  Diagnosis Date  . Acquired absence of left leg below knee (HCC)   . Anxiety   . 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 Cataract    Phreesia 11/19/2019  . Chronic kidney disease    Phreesia 11/19/2019  . Clotting disorder ([place])    Phreesia 11/19/2019  . Coronary atherosclerosis of native coronary artery    DES to LAD September 2014  . Depression   . Depression    Phreesia 11/19/2019  . Diabetes mellitus without complication ([place])    Phreesia 11/19/2019  . Diabetic ketoacidosis with coma associated with type 1 diabetes mellitus ([place])   . DKA, type 1 ([place]) 11/17/2016  . Facial cellulitis    12/2010  . Glomerulonephritis   . Headache(784.0)   . Hemiplegia, unspecified affecting right  dominant side ([place])   . History of stroke   . Hyperlipidemia   . Insulin dependent diabetes mellitus    History of diabetic ketoacidosis  . Ischemic cardiomyopathy    LVEF 40-45%  . Major depressive disorder, single episode, unspecified   . Pancreatitis, acute 11/07/2016  . Peripheral arterial disease ([place])   . Shingles    11/2010  . ST elevation myocardial infarction (STEMI) of anterior wall [facility])    Late presentation September 2014  . Stroke [facility])    2018    Past Surgical History:  Procedure Laterality Date  . AMPUTATION  03/03/2011   Procedure: AMPUTATION DIGIT;  Surgeon: Newt Minion, MD;  Location: [facility];  Service: Orthopedics;  Laterality: Left;  Left Foot Amputation 4th and 5th toes at MTP joint  . AMPUTATION  04/22/2011   Procedure: AMPUTATION BELOW KNEE;  Surgeon: Newt Minion, MD;  Location: [facility];  Service: Orthopedics;  Laterality: Left;  Left Below Knee Amputation  . CATARACT EXTRACTION W/PHACO Left 01/09/2020   Procedure: CATARACT EXTRACTION PHACO AND INTRAOCULAR LENS PLACEMENT (IOC);  Surgeon: Baruch Goldmann, MD;  Location: [facility];  Service: Ophthalmology;  Laterality: Left;  CDE: 9.73  .  DILATION AND CURETTAGE OF UTERUS    . EMBOLECTOMY  01/29/2011   Procedure: EMBOLECTOMY;  Surgeon: Mal Misty, MD;  Location: [facility] OR;  Service: Vascular;  Laterality: Left;  Left Popliteal and Tibial Embolectomy with patch angioplasty  . INCISION AND DRAINAGE ABSCESS N/A 04/06/2016   Procedure: INCISION AND DRAINAGE ABSCESS;  Surgeon: Jonnie Kind, MD;  Location: [facility];  Service: Gynecology;  Laterality: N/A;  . LEFT HEART CATHETERIZATION WITH CORONARY ANGIOGRAM N/A 10/01/2012   Procedure: LEFT HEART CATHETERIZATION WITH CORONARY ANGIOGRAM;  Surgeon: Peter M Martinique, MD;  Location: [facility] CATH LAB;  Service: Cardiovascular;  Laterality: N/A;  . LOWER EXTREMITY ANGIOGRAM N/A 01/27/2011   Procedure: LOWER EXTREMITY ANGIOGRAM;  Surgeon: Rosetta Posner, MD;  Location: [facility] CATH LAB;  Service:  Cardiovascular;  Laterality: N/A;  . LOWER EXTREMITY ANGIOGRAM N/A 01/28/2011   Procedure: LOWER EXTREMITY ANGIOGRAM;  Surgeon: Conrad Moreauville, MD;  Location: [facility] CATH LAB;  Service: Cardiovascular;  Laterality: N/A;  . LOWER EXTREMITY ANGIOGRAM Left 01/29/2011   Procedure: LOWER EXTREMITY ANGIOGRAM;  Surgeon: Mal Misty, MD;  Location: [facility] CATH LAB;  Service: Cardiovascular;  Laterality: Left;  Marland Kitchen MULTIPLE TOOTH EXTRACTIONS    . TEE WITHOUT CARDIOVERSION  04/15/2011   Procedure: TRANSESOPHAGEAL ECHOCARDIOGRAM (TEE);  Surgeon: [name], MD;  Location: [facility];  Service: Cardiovascular;  Laterality: N/A;  . WRIST SURGERY     Left    MEDICATIONS: No current facility-administered medications for this encounter.   Marland Kitchen ACCU-CHEK AVIVA PLUS test strip  . apixaban (ELIQUIS) 2.5 MG TABS tablet  . atorvastatin (LIPITOR) 80 MG tablet  . carvedilol (COREG) 6.25 MG tablet  . insulin aspart (NOVOLOG) 100 UNIT/ML injection  . Insulin Glargine (LANTUS SOLOSTAR) 100 UNIT/ML Solostar Pen  . nitroGLYCERIN (NITROSTAT) 0.4 MG SL tablet  . ondansetron (ZOFRAN) 4 MG tablet  . oxyCODONE-acetaminophen (PERCOCET) 10-325 MG tablet    Myra Gianotti, PA-C Surgical Short Stay/Anesthesiology [facility] Phone [phone] [facility] Phone [phone] 01/23/2020 2:09 PM

## 2020-01-23 NOTE — Progress Notes (Signed)
Patient was given pre-op instructions over the phone. The opportunity was given for the patient to ask questions. No further questions asked. Patient verbalized understanding of instructions given.   Nothing to eat or drink after midnight.  7 days prior to surgery STOP taking any Aspirin (unless otherwise instructed by your surgeon), Aleve, Naproxen, Ibuprofen, Motrin, Advil, Goody's, BC's, all herbal medications, fish oil, and all vitamins.   PCP -  Cardiologist -   Chest x-ray -  EKG -  Stress Test -  ECHO -  Cardiac Cath -     Fasting Blood Sugar -  Checks Blood Sugar _____ times a day  Blood Thinner Instructions: Eliquis   COVID TEST-    Anesthesia review:  Yes HX afib  Patient denies shortness of breath, fever, cough and chest pain at PAT appointment   All instructions explained to the patient, with a verbal understanding of the material. Patient agrees to go over the instructions while at home for a better understanding. Patient also instructed to self quarantine after being tested for COVID-19. The opportunity to ask questions was provided.

## 2020-01-24 ENCOUNTER — Ambulatory Visit: Payer: Medicaid Other | Admitting: Nurse Practitioner

## 2020-01-24 ENCOUNTER — Ambulatory Visit (HOSPITAL_COMMUNITY): Admission: RE | Admit: 2020-01-24 | Payer: Medicaid Other | Source: Home / Self Care | Admitting: Ophthalmology

## 2020-01-24 ENCOUNTER — Encounter: Payer: Self-pay | Admitting: Nurse Practitioner

## 2020-01-24 ENCOUNTER — Encounter (HOSPITAL_COMMUNITY): Admission: RE | Payer: Self-pay | Source: Home / Self Care

## 2020-01-24 HISTORY — DX: Occlusion and stenosis of left carotid artery: I65.22

## 2020-01-24 SURGERY — PARS PLANA VITRECTOMY WITH 25 GAUGE
Anesthesia: Monitor Anesthesia Care | Site: Eye | Laterality: Left

## 2020-01-30 ENCOUNTER — Other Ambulatory Visit: Payer: Self-pay

## 2020-01-30 ENCOUNTER — Telehealth: Payer: Medicaid Other | Admitting: Internal Medicine

## 2020-02-04 ENCOUNTER — Ambulatory Visit: Payer: Medicaid Other | Admitting: Nurse Practitioner

## 2020-02-07 ENCOUNTER — Telehealth: Payer: Self-pay

## 2020-02-07 ENCOUNTER — Other Ambulatory Visit (HOSPITAL_COMMUNITY): Payer: Medicaid Other

## 2020-02-07 NOTE — Telephone Encounter (Signed)
Patients daughter calling she states the patient has tested positive with covid and is concerned with all her other health problems wants to know if dr patel has an recommendations and is requesting something for a uti [phone]

## 2020-02-07 NOTE — Telephone Encounter (Signed)
Please advise 

## 2020-02-08 ENCOUNTER — Other Ambulatory Visit (HOSPITAL_COMMUNITY): Payer: Medicaid Other

## 2020-02-08 NOTE — Telephone Encounter (Signed)
Called and spoke to daughter. Advised to take Tylenol for fever or muscle aches. Burning sensation could be due to dehydration, advised to increase fluid intake and take Azo for symptomatic relief. If dysuria persistent, will check UA and prescribe antibiotics.

## 2020-02-11 ENCOUNTER — Ambulatory Visit: Admit: 2020-02-11 | Payer: Medicaid Other | Admitting: Ophthalmology

## 2020-02-11 SURGERY — PHACOEMULSIFICATION, CATARACT, WITH IOL INSERTION
Anesthesia: Monitor Anesthesia Care | Laterality: Right

## 2020-03-18 ENCOUNTER — Other Ambulatory Visit: Payer: Self-pay | Admitting: Cardiology

## 2020-04-02 ENCOUNTER — Ambulatory Visit: Payer: Medicaid Other | Admitting: Nurse Practitioner

## 2020-04-03 ENCOUNTER — Other Ambulatory Visit: Payer: Self-pay

## 2020-04-03 ENCOUNTER — Encounter: Payer: Self-pay | Admitting: Nurse Practitioner

## 2020-04-03 ENCOUNTER — Ambulatory Visit (INDEPENDENT_AMBULATORY_CARE_PROVIDER_SITE_OTHER): Payer: Medicaid Other | Admitting: Nurse Practitioner

## 2020-04-03 VITALS — BP 151/81 | HR 74 | Ht 61.0 in | Wt 117.0 lb

## 2020-04-03 DIAGNOSIS — E559 Vitamin D deficiency, unspecified: Secondary | ICD-10-CM | POA: Diagnosis not present

## 2020-04-03 DIAGNOSIS — I10 Essential (primary) hypertension: Secondary | ICD-10-CM | POA: Diagnosis not present

## 2020-04-03 DIAGNOSIS — E782 Mixed hyperlipidemia: Secondary | ICD-10-CM | POA: Diagnosis not present

## 2020-04-03 DIAGNOSIS — E1059 Type 1 diabetes mellitus with other circulatory complications: Secondary | ICD-10-CM

## 2020-04-03 LAB — POCT GLYCOSYLATED HEMOGLOBIN (HGB A1C): HbA1c, POC (controlled diabetic range): 13 % — AB (ref 0.0–7.0)

## 2020-04-03 MED ORDER — ACCU-CHEK GUIDE VI STRP: ORAL_STRIP | 12 refills | Status: DC

## 2020-04-03 MED ORDER — DEXCOM G6 TRANSMITTER MISC: 1.0000 | 1 refills | Status: DC

## 2020-04-03 MED ORDER — DEXCOM G6 SENSOR MISC: 6 refills | Status: DC

## 2020-04-03 NOTE — Progress Notes (Signed)
Endocrinology Consult Note       04/03/2020, 3:45 PM   Subjective:    Patient ID: [name], female    DOB: 04-18-1970.  [name] is being seen in consultation for management of currently uncontrolled symptomatic diabetes requested by  Lindell Spar, MD.   Past Medical History:  Diagnosis Date  . Acquired absence of left leg below knee (HCC)   . Anxiety   . 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 Cataract    Phreesia 11/19/2019  . Chronic kidney disease    Phreesia 11/19/2019  . Clotting disorder ([place])    Phreesia 11/19/2019  . Coronary atherosclerosis of native coronary artery    DES to LAD September 2014  . Depression   . Depression    Phreesia 11/19/2019  . Diabetes mellitus without complication ([place])    Phreesia 11/19/2019  . Diabetic ketoacidosis with coma associated with type 1 diabetes mellitus ([place])   . DKA, type 1 ([place]) 11/17/2016  . Facial cellulitis    12/2010  . Glomerulonephritis   . Headache(784.0)   . Hemiplegia, unspecified affecting right dominant side ([place])   . History of stroke   . Hyperlipidemia   . Insulin dependent diabetes mellitus    History of diabetic ketoacidosis  . Ischemic cardiomyopathy    LVEF 40-45%  . Left carotid artery occlusion   . Major depressive disorder, single episode, unspecified   . Pancreatitis, acute 11/07/2016  . Peripheral arterial disease ([place])   . Shingles    11/2010  . ST elevation myocardial infarction (STEMI) of anterior wall [facility])    Late presentation September 2014  . Stroke [facility])    2018    Past Surgical History:  Procedure Laterality Date  . AMPUTATION  03/03/2011   Procedure: AMPUTATION DIGIT;  Surgeon: Newt Minion, MD;  Location: [facility];  Service: Orthopedics;  Laterality: Left;  Left Foot Amputation 4th and 5th toes at MTP joint  . AMPUTATION  04/22/2011   Procedure:  AMPUTATION BELOW KNEE;  Surgeon: Newt Minion, MD;  Location: [facility];  Service: Orthopedics;  Laterality: Left;  Left Below Knee Amputation  . CATARACT EXTRACTION W/PHACO Left 01/09/2020   Procedure: CATARACT EXTRACTION PHACO AND INTRAOCULAR LENS PLACEMENT (IOC);  Surgeon: Baruch Goldmann, MD;  Location: [facility];  Service: Ophthalmology;  Laterality: Left;  CDE: 9.73  . DILATION AND CURETTAGE OF UTERUS    . EMBOLECTOMY  01/29/2011   Procedure: EMBOLECTOMY;  Surgeon: Mal Misty, MD;  Location: [facility] OR;  Service: Vascular;  Laterality: Left;  Left Popliteal and Tibial Embolectomy with patch angioplasty  . INCISION AND DRAINAGE ABSCESS N/A 04/06/2016   Procedure: INCISION AND DRAINAGE ABSCESS;  Surgeon: Jonnie Kind, MD;  Location: [facility];  Service: Gynecology;  Laterality: N/A;  . LEFT HEART CATHETERIZATION WITH CORONARY ANGIOGRAM N/A 10/01/2012   Procedure: LEFT HEART CATHETERIZATION WITH CORONARY ANGIOGRAM;  Surgeon: Peter M Martinique, MD;  Location: [facility] CATH LAB;  Service: Cardiovascular;  Laterality: N/A;  . LOWER EXTREMITY ANGIOGRAM N/A 01/27/2011   Procedure: LOWER EXTREMITY ANGIOGRAM;  Surgeon: Sherren Mocha  Katina Dung, MD;  Location: [facility] CATH LAB;  Service: Cardiovascular;  Laterality: N/A;  . LOWER EXTREMITY ANGIOGRAM N/A 01/28/2011   Procedure: LOWER EXTREMITY ANGIOGRAM;  Surgeon: Conrad [place], MD;  Location: [facility] CATH LAB;  Service: Cardiovascular;  Laterality: N/A;  . LOWER EXTREMITY ANGIOGRAM Left 01/29/2011   Procedure: LOWER EXTREMITY ANGIOGRAM;  Surgeon: Mal Misty, MD;  Location: [facility] CATH LAB;  Service: Cardiovascular;  Laterality: Left;  Marland Kitchen MULTIPLE TOOTH EXTRACTIONS    . TEE WITHOUT CARDIOVERSION  04/15/2011   Procedure: TRANSESOPHAGEAL ECHOCARDIOGRAM (TEE);  Surgeon: [name], MD;  [facility];  Service: Cardiovascular;  Laterality: N/A;  . WRIST SURGERY     Left    Social History   Socioeconomic History  . Marital status: Single    Spouse name: Not on file  . Number of  children: Not on file  . Years of education: Not on file  . Highest education level: Not on file  Occupational History  . Not on file  Tobacco Use  . Smoking status: Current Every Day Smoker    Packs/day: 0.50    Years: 21.00    Pack years: 10.50    Types: Cigarettes    Start date: 07/06/1989    Last attempt to quit: 01/30/2011    Years since quitting: 9.1  . Smokeless tobacco: Never Used  Vaping Use  . Vaping Use: Never used  Substance and Sexual Activity  . Alcohol use: No    Alcohol/week: 0.0 standard drinks    Comment: rarely  . Drug use: Yes    Frequency: 1.0 times per week    Types: Marijuana    Comment: Former  . Sexual activity: Yes    Partners: Male    Comment: pt stated that her tubal was only 50% effective- "not cut and burned"  Other Topics Concern  . Not on file  Social History Narrative   Unemployed.  Lives in [place] with [place], [place], and mother.  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   Social Determinants of Health   Financial Resource Strain: Not on file  Food Insecurity: Not on file  Transportation Needs: Not on file  Physical Activity: Not on file  Stress: Not on file  Social Connections: Not on file    Family History  Problem Relation Age of Onset  . COPD Mother   . Hypertension Mother   . Diabetes Mother   . Stroke Mother   . Coronary artery disease Mother   . Diabetes Father   . Hypertension Father   . Coronary artery disease Father   . Anesthesia problems Neg Hx     Outpatient Encounter Medications as of 04/03/2020  Medication Sig  . apixaban (ELIQUIS) 2.5 MG TABS tablet Take 1 tablet (2.5 mg total) by mouth 2 (two) times daily.  Marland Kitchen atorvastatin (LIPITOR) 80 MG tablet Take 1 tablet (80 mg total) by mouth daily at 6 PM.  . carvedilol (COREG) 6.25 MG tablet TAKE ONE TABLET (6.25MG  TOTAL) BY MOUTH TWO TIMES DAILY  . Continuous Blood Gluc Sensor (DEXCOM G6 SENSOR) MISC Apply new sensor every 10 days as instructed  . Continuous  Blood Gluc Transmit (DEXCOM G6 TRANSMITTER) MISC 1 Piece by Does not apply route as directed.  Marland Kitchen glucose blood (ACCU-CHEK GUIDE) test strip Use as instructed  . insulin aspart (NOVOLOG) 100 UNIT/ML injection Inject 2-5 Units into the skin 3 (three) times daily before meals.  . Insulin Glargine (LANTUS SOLOSTAR) 100 UNIT/ML Solostar  Pen Inject 10 Units at bedtime into the skin. (Patient taking differently: Inject 15 Units into the skin at bedtime.)  . nitroGLYCERIN (NITROSTAT) 0.4 MG SL tablet Place 1 tablet (0.4 mg total) under the tongue every 5 (five) minutes as needed for chest pain.  Marland Kitchen ondansetron (ZOFRAN) 4 MG tablet Take 4 mg by mouth 4 (four) times daily as needed for nausea or vomiting.  Marland Kitchen oxyCODONE-acetaminophen (PERCOCET) 10-325 MG tablet Take 0.5 tablets every 4 (four) hours as needed by mouth for pain.  . [DISCONTINUED] ACCU-CHEK AVIVA PLUS test strip USE TO TEST 4 TIMES DAILY AS DIRECTED.   No facility-administered encounter medications on file as of 04/03/2020.    ALLERGIES: Allergies  Allergen Reactions  . Ibuprofen Other (See Comments)    Kidney disease  . Losartan Swelling    VACCINATION STATUS: Immunization History  Administered Date(s) Administered  . Influenza, Seasonal, Injecte, Preservative Fre 11/23/2018  . Influenza,inj,Quad PF,6+ Mos 10/24/2014, 11/28/2019  . Influenza-Unspecified 11/08/2011, 01/15/2013, 11/20/2015  . Pneumococcal Polysaccharide-23 11/08/2016    Diabetes She presents for her follow-up diabetic visit. She has type 1 diabetes mellitus. Onset time: She was diagnosed at approximate age of 72. Her disease course has been worsening. Hypoglycemia symptoms include confusion, nervousness/anxiousness, speech difficulty, sweats and tremors. Associated symptoms include blurred vision, fatigue, foot paresthesias, polydipsia, polyphagia and polyuria. Hypoglycemia complications include required assistance. (EMS was called in between visits due to severe  hypoglycemia during the afternoon.) Symptoms are worsening. Diabetic complications include a CVA, heart disease, nephropathy, peripheral neuropathy and PVD. Risk factors for coronary artery disease include diabetes mellitus, dyslipidemia, hypertension, sedentary lifestyle and tobacco exposure. Current diabetic treatment includes intensive insulin program. She is compliant with treatment some of the time. Her weight is fluctuating minimally. She is following a generally unhealthy diet. When asked about meal planning, she reported none. She has not had a previous visit with a dietitian. She never (Has L AKA with prosthesis) participates in exercise. Her home blood glucose trend is fluctuating dramatically. (She presents today, accompanied by her daughter, with her logs and broken meter to review showing widely fluctuating glycemic profile, mostly hyperglycemia.  Her POCT A1c today is 13%, worsening from last visit of 10.5%.  Her daughter admits to lowering insulin dose independently, despite high glucose readings, due to fear of hypoglycemia (given recent hypoglycemic events where EMS was called).) An ACE inhibitor/angiotensin II receptor blocker is not being taken. She sees a podiatrist.Eye exam is current.  Hyperlipidemia This is a chronic problem. The current episode started more than 1 year ago. The problem is uncontrolled. Recent lipid tests were reviewed and are variable. Exacerbating diseases include chronic renal disease and diabetes. Factors aggravating her hyperlipidemia include beta blockers, smoking and fatty foods. Current antihyperlipidemic treatment includes statins. Compliance problems include adherence to diet and adherence to exercise.  Risk factors for coronary artery disease include diabetes mellitus, dyslipidemia, hypertension and a sedentary lifestyle.  Hypertension This is a chronic problem. The current episode started more than 1 year ago. The problem has been waxing and waning since onset.  The problem is uncontrolled. Associated symptoms include blurred vision and sweats. There are no associated agents to hypertension. Risk factors for coronary artery disease include diabetes mellitus, dyslipidemia, smoking/tobacco exposure and sedentary lifestyle. Past treatments include beta blockers. The current treatment provides mild improvement. Compliance problems include diet and exercise.  Hypertensive end-organ damage includes kidney disease, CAD/MI, CVA, heart failure and PVD. Identifiable causes of hypertension include chronic renal disease.  Review of systems  Constitutional: + Minimally fluctuating body weight,  current Body mass index is 22.11 kg/m. 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), no xerophthalmia ENT: no sore throat, no nodules palpated in throat, no dysphagia/odynophagia, no hoarseness Cardiovascular: no chest pain, no shortness of breath, no palpitations, no leg swelling Respiratory: no cough, no shortness of breath Gastrointestinal: no nausea/vomiting/diarrhea Genitourinary: +polyuria, + polyphagia Musculoskeletal: no muscle/joint aches, Hx of L AKA- with prosthesis Skin: no rashes, no hyperemia Neurological: no tremors, no numbness, no tingling, no dizziness Psychiatric: no depression, no anxiety   Objective:    BP (!) 151/81 (BP Location: Left Arm, Patient Position: Sitting)   Pulse 74   Ht 5\' 1"  (1.549 m)   Wt 117 lb (53.1 kg)   LMP 01/12/2011   BMI 22.11 kg/m   Wt Readings from Last 3 Encounters:  04/03/20 117 lb (53.1 kg)  01/07/20 115 lb (52.2 kg)  12/11/19 116 lb (52.6 kg)    BP Readings from Last 3 Encounters:  04/03/20 (!) 151/81  01/09/20 128/62  01/07/20 (!) 155/60    Physical Exam- Limited  Constitutional:  Body mass index is 22.11 kg/m. , not in acute distress, mildly anxious state of mind, tearful during visit Eyes:  EOMI, no  exophthalmos Neck: Supple Cardiovascular: RRR, no murmers, rubs, or gallops, no edema Respiratory: Adequate breathing efforts, no crackles, rales, rhonchi, or wheezing Musculoskeletal: L AKA with prosthesis, strength intact in all four extremities, no gross restriction of joint movements Skin:  no rashes, no hyperemia, + nicotinic discoloration to fingernails Neurological: no tremor with outstretched hands   CMP ( most recent) CMP     Component Value Date/Time   NA 135 01/07/2020 0944   K 4.9 01/07/2020 0944   CL 105 01/07/2020 0944   CO2 23 01/07/2020 0944   GLUCOSE 262 (H) 01/07/2020 0944   BUN 33 (H) 01/07/2020 0944   CREATININE 2.02 (H) 01/07/2020 0944   CALCIUM 8.4 (L) 01/07/2020 0944   PROT 6.1 (L) 08/02/2018 2332   ALBUMIN 2.9 (L) 08/02/2018 2332   AST 18 08/02/2018 2332   ALT 18 08/02/2018 2332   ALKPHOS 92 08/02/2018 2332   BILITOT 0.8 08/02/2018 2332   GFRNONAA 30 (L) 01/07/2020 0944   GFRAA 28 (L) 09/28/2019 1456     Diabetic Labs (most recent): Lab Results  Component Value Date   HGBA1C 13.0 (A) 04/03/2020   HGBA1C 10.5 (H) 01/07/2020   HGBA1C 11.4 (A) 12/11/2019     Lipid Panel ( most recent) Lipid Panel     Component Value Date/Time   CHOL 162 11/07/2016 0722   TRIG 152 (H) 11/07/2016 0722   HDL 39 (L) 11/07/2016 0722   CHOLHDL 4.2 11/07/2016 0722   VLDL 30 11/07/2016 0722   LDLCALC 93 11/07/2016 0722       Assessment & Plan:   1) Type 1 diabetes mellitus with other circulatory complication ([place])  She presents today, accompanied by her daughter, with her logs and broken meter to review showing widely fluctuating glycemic profile, mostly hyperglycemia.  Her POCT A1c today is 13%, worsening from last visit of 10.5%.  Her daughter admits to lowering insulin dose independently, despite high glucose readings, due to fear of hypoglycemia (given recent hypoglycemic events where EMS was called).  - CHARI PARMENTER has currently uncontrolled symptomatic  type 2 DM since 50 years of age.  Recent labs reviewed.  -  I had a long discussion with her about the progressive nature of diabetes and the pathology behind its complications. -her diabetes is complicated by CAD, PVD, CKD, CVA, MI, DKA and she remains at a high risk for more acute and chronic complications which include retinopathy, and neuropathy. These are all discussed in detail with her.  - Nutritional counseling repeated at each appointment due to patients tendency to fall back in to old habits.  - The patient admits there is a room for improvement in their diet and drink choices. -  Suggestion is made for the patient to avoid simple carbohydrates from their diet including Cakes, Sweet Desserts / Pastries, Ice Cream, Soda (diet and regular), Sweet Tea, Candies, Chips, Cookies, Sweet Pastries,  Store Bought Juices, Alcohol in Excess of  1-2 drinks a day, Artificial Sweeteners, Coffee Creamer, and "Sugar-free" Products. This will help patient to have stable blood glucose profile and potentially avoid unintended weight gain.   - I encouraged the patient to switch to  unprocessed or minimally processed complex starch and increased protein intake (animal or plant source), fruits, and vegetables.   - Patient is advised to stick to a routine mealtimes to eat 3 meals  a day and avoid unnecessary snacks ( to snack only to correct hypoglycemia).  - she will be scheduled with Jearld Fenton, RDN, CDE for diabetes education.  - I have approached her with the following individualized plan to manage  her diabetes and patient agrees:   -She is advised to stick to original plan of Lantus 15 units SQ daily at bedtime and is advised to lower her dose of Novolog to 2-5 units TID with meals if glucose is above 90 and she is eating.  Specific instructions on how to titrate insulin dose based on glucose readings given to patient in writing.  -She is encouraged to continue monitoring blood glucose 4 times per  day, before meals and before bed, and notify the clinic if she has readings less than 70 or greater than 300 for 3 tests in a row.  She could benefit from CGM device for safety purposes to help avoid debilitating low glucose events.  Sample Dexcom provided to her today, will send in Rx to [place].  - she is warned not to take insulin without proper monitoring per orders.  - Adjustment parameters are given to her for hypo and hyperglycemia in writing.  - Specific targets for  A1c;  LDL, HDL,  and Triglycerides were discussed with the patient.  2) Blood Pressure /Hypertension:  Her blood pressure is still not controlled to target.  She is advised to continue Carvedilol 6.25 mg po twice daily.  Will defer any med changes to nephrology.  3) Lipids/Hyperlipidemia:  Her most recent lipid panel from 11/07/16 shows controlled LDL of 93 and elevated triglycerides of 152.  She is advised to continue Lipitor 80 mg po daily at bedtime.  Side effects and precautions discussed with her.  Will recheck lipid panel prior to next visit.  4)  Weight/Diet: Her Body mass index is 22.11 kg/m.  -  she is not a candidate for weight loss.  Exercise, and detailed carbohydrates information provided  -  detailed on discharge instructions.  5) Chronic Care/Health Maintenance: -she is on Statin medications and is encouraged to initiate and continue to follow up with Ophthalmology, Dentist,  Podiatrist at least yearly or according to recommendations, and advised to [place]. I have recommended yearly flu vaccine and pneumonia vaccine at least  every 5 years; moderate intensity exercise for up to 150 minutes weekly; and  sleep for at least 7 hours a day.  Smoking cessation instruction/counseling given:  counseled patient on the dangers of tobacco use, advised patient to stop smoking, and reviewed strategies to maximize success   - she is  advised to maintain close follow up with Lindell Spar, MD for primary care  needs, as well as her other providers for optimal and coordinated care.   - Time spent on this patient care encounter:  40 min, of which > 50% was spent in  counseling and the rest reviewing her blood glucose logs , discussing her hypoglycemia and hyperglycemia episodes, reviewing her current and  previous labs / studies  ( including abstraction from other facilities) and medications  doses and developing a  long term treatment plan and documenting her care.   Please refer to Patient Instructions for Blood Glucose Monitoring and Insulin/Medications Dosing Guide"  in media tab for additional information. Please  also refer to " Patient Self Inventory" in the Media  tab for reviewed elements of pertinent patient history.  Silvestre Gunner participated in the discussions, expressed understanding, and voiced agreement with the above plans.  All questions were answered to her satisfaction. she is encouraged to contact clinic should she have any questions or concerns prior to her return visit.   Follow up plan: - Return in about 3 months (around 07/04/2020) for Diabetes follow up with A1c in office, Previsit labs, Bring glucometer and logs.  Rayetta Pigg, [facility] [address] [place],  [address] Phone: [phone] Fax: [phone]  04/03/2020, 3:45 PM

## 2020-04-03 NOTE — Patient Instructions (Signed)

## 2020-04-03 NOTE — Addendum Note (Signed)
Addended by: Brita Romp on: 04/03/2020 03:47 PM   Modules accepted: Orders

## 2020-04-12 ENCOUNTER — Other Ambulatory Visit: Payer: Self-pay | Admitting: Internal Medicine

## 2020-04-28 ENCOUNTER — Telehealth: Payer: Self-pay | Admitting: Nurse Practitioner

## 2020-04-28 NOTE — Telephone Encounter (Signed)
[name] is calling from [facility] start and is requesting a call back in regards to needing more information, he states he also sent a fax over. [phone]

## 2020-04-28 NOTE — Telephone Encounter (Signed)
Tried to call [name] with Dexcom, call would not go through at time of call.

## 2020-04-29 NOTE — Telephone Encounter (Signed)
Tried to call [place] with Dexcom, called could not be completed.

## 2020-05-01 ENCOUNTER — Telehealth: Payer: Self-pay | Admitting: Nurse Practitioner

## 2020-05-01 NOTE — Telephone Encounter (Signed)
Returned call, they are sending over prior auth forms to be filled out and faxed back.

## 2020-05-01 NOTE — Telephone Encounter (Signed)
Mable called from [employer] and is requesting a call back in regards to patient, states a fax was also sent over. [phone]

## 2020-05-06 ENCOUNTER — Other Ambulatory Visit: Payer: Self-pay

## 2020-05-06 ENCOUNTER — Telehealth: Payer: Self-pay

## 2020-05-06 DIAGNOSIS — E1059 Type 1 diabetes mellitus with other circulatory complications: Secondary | ICD-10-CM

## 2020-05-06 MED ORDER — DEXCOM G6 RECEIVER DEVI: 0 refills | Status: DC

## 2020-05-06 NOTE — Progress Notes (Signed)
dexcom g6

## 2020-05-06 NOTE — Telephone Encounter (Signed)
Sent in

## 2020-05-06 NOTE — Telephone Encounter (Signed)
Pt's daughter left a VM that she has been approved for the [place], [employer] asked her to call us and get RX sent in for this.

## 2020-05-06 NOTE — Telephone Encounter (Signed)
Patient needs the RX for the receiver, she said Whitney gave her the rest in the office

## 2020-05-19 ENCOUNTER — Other Ambulatory Visit: Payer: Self-pay | Admitting: Nurse Practitioner

## 2020-05-19 DIAGNOSIS — E1059 Type 1 diabetes mellitus with other circulatory complications: Secondary | ICD-10-CM

## 2020-05-19 DIAGNOSIS — I10 Essential (primary) hypertension: Secondary | ICD-10-CM

## 2020-05-19 DIAGNOSIS — E782 Mixed hyperlipidemia: Secondary | ICD-10-CM

## 2020-05-19 DIAGNOSIS — E559 Vitamin D deficiency, unspecified: Secondary | ICD-10-CM

## 2020-06-13 ENCOUNTER — Other Ambulatory Visit: Payer: Self-pay | Admitting: Nurse Practitioner

## 2020-07-07 ENCOUNTER — Ambulatory Visit: Payer: Medicaid Other | Admitting: Nurse Practitioner

## 2020-07-09 ENCOUNTER — Encounter: Payer: Self-pay | Admitting: Nurse Practitioner

## 2020-07-10 ENCOUNTER — Ambulatory Visit: Payer: Medicaid Other | Admitting: Nurse Practitioner

## 2020-07-24 ENCOUNTER — Ambulatory Visit (INDEPENDENT_AMBULATORY_CARE_PROVIDER_SITE_OTHER): Payer: Medicaid Other | Admitting: Nurse Practitioner

## 2020-07-24 ENCOUNTER — Encounter: Payer: Self-pay | Admitting: Nurse Practitioner

## 2020-07-24 VITALS — BP 123/70 | HR 82 | Ht 61.0 in | Wt 108.0 lb

## 2020-07-24 DIAGNOSIS — E782 Mixed hyperlipidemia: Secondary | ICD-10-CM

## 2020-07-24 DIAGNOSIS — E1059 Type 1 diabetes mellitus with other circulatory complications: Secondary | ICD-10-CM | POA: Diagnosis not present

## 2020-07-24 DIAGNOSIS — I10 Essential (primary) hypertension: Secondary | ICD-10-CM | POA: Diagnosis not present

## 2020-07-24 DIAGNOSIS — E559 Vitamin D deficiency, unspecified: Secondary | ICD-10-CM

## 2020-07-24 LAB — COMPREHENSIVE METABOLIC PANEL
ALT: 22 IU/L (ref 0–32)
AST: 20 IU/L (ref 0–40)
Albumin/Globulin Ratio: 1.5 (ref 1.2–2.2)
Albumin: 3.9 g/dL (ref 3.8–4.9)
Alkaline Phosphatase: 145 IU/L — ABNORMAL HIGH (ref 44–121)
BUN/Creatinine Ratio: 12 (ref 9–23)
BUN: 25 mg/dL — ABNORMAL HIGH (ref 6–24)
Bilirubin Total: 0.5 mg/dL (ref 0.0–1.2)
CO2: 23 mmol/L (ref 20–29)
Calcium: 9.6 mg/dL (ref 8.7–10.2)
Chloride: 97 mmol/L (ref 96–106)
Creatinine, Ser: 2.03 mg/dL — ABNORMAL HIGH (ref 0.57–1.00)
Globulin, Total: 2.6 g/dL (ref 1.5–4.5)
Glucose: 346 mg/dL — ABNORMAL HIGH (ref 65–99)
Potassium: 4.5 mmol/L (ref 3.5–5.2)
Sodium: 135 mmol/L (ref 134–144)
Total Protein: 6.5 g/dL (ref 6.0–8.5)
eGFR: 29 mL/min/{1.73_m2} — ABNORMAL LOW (ref >59)

## 2020-07-24 LAB — POCT GLYCOSYLATED HEMOGLOBIN (HGB A1C): Hemoglobin A1C: 14 % — AB (ref 4.0–5.6)

## 2020-07-24 LAB — VITAMIN D 25 HYDROXY (VIT D DEFICIENCY, FRACTURES): Vit D, 25-Hydroxy: 19.4 ng/mL — ABNORMAL LOW (ref 30.0–100.0)

## 2020-07-24 LAB — LIPID PANEL
Chol/HDL Ratio: 2.8 ratio (ref 0.0–4.4)
Cholesterol, Total: 185 mg/dL (ref 100–199)
HDL: 66 mg/dL (ref >39)
LDL Chol Calc (NIH): 91 mg/dL (ref 0–99)
Triglycerides: 168 mg/dL — ABNORMAL HIGH (ref 0–149)
VLDL Cholesterol Cal: 28 mg/dL (ref 5–40)

## 2020-07-24 LAB — TSH: TSH: 3.86 u[IU]/mL (ref 0.450–4.500)

## 2020-07-24 LAB — T4, FREE: Free T4: 1.18 ng/dL (ref 0.82–1.77)

## 2020-07-24 MED ORDER — LANTUS SOLOSTAR 100 UNIT/ML ~~LOC~~ SOPN
20.0000 [IU] | PEN_INJECTOR | Freq: Every day | SUBCUTANEOUS | 11 refills | Status: DC
Start: 2020-07-24 — End: 2021-04-08

## 2020-07-24 MED ORDER — UNIFINE PENTIPS 31G X 8 MM MISC: 11 refills | Status: DC

## 2020-07-24 MED ORDER — INSULIN ASPART 100 UNIT/ML FLEXPEN
2.0000 [IU] | PEN_INJECTOR | Freq: Three times a day (TID) | SUBCUTANEOUS | 3 refills | Status: DC
Start: 2020-07-24 — End: 2021-04-08

## 2020-07-24 NOTE — Progress Notes (Signed)
Endocrinology Follow Up Note       07/24/2020, 2:10 PM   Subjective:    Patient ID: [name], female    DOB: Dec 10, 1970.  [name] is being seen in follow up after being seen in consultation for management of currently uncontrolled symptomatic diabetes requested by  Lindell Spar, MD.   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[place])    Prior embolectomy, previously on Coumadin   Cataract    Phreesia 11/19/2019   Chronic kidney disease    Phreesia 11/19/2019   Clotting disorder ([place])    Phreesia 11/19/2019   Coronary atherosclerosis of native coronary artery    DES to LAD September 2014   Depression    Depression    Phreesia 11/19/2019   Diabetes mellitus without complication ([place])    Phreesia 11/19/2019   Diabetic ketoacidosis with coma associated with type 1 diabetes mellitus ([place])    DKA, type 1 ([place]) 11/17/2016   Facial cellulitis    12/2010   Glomerulonephritis    Headache(784.0)    Hemiplegia, unspecified affecting right dominant side ([place])    History of stroke    Hyperlipidemia    Insulin dependent diabetes mellitus    History of diabetic ketoacidosis   Ischemic cardiomyopathy    LVEF 40-45%   Left carotid artery occlusion    Major depressive disorder, single episode, unspecified    Pancreatitis, acute 11/07/2016   Peripheral arterial disease ([place])    Shingles    11/2010   ST elevation myocardial infarction (STEMI) of anterior wall [facility])    Late presentation September 2014   Stroke [facility])    2018    Past Surgical History:  Procedure Laterality Date   AMPUTATION  03/03/2011   Procedure: AMPUTATION DIGIT;  Surgeon: Newt Minion, MD;  Location: [place];  Service: Orthopedics;  Laterality: Left;  Left Foot Amputation 4th and 5th toes at MTP joint   AMPUTATION  04/22/2011   Procedure: AMPUTATION BELOW  KNEE;  Surgeon: Newt Minion, MD;  Location: [place];  Service: Orthopedics;  Laterality: Left;  Left Below Knee Amputation   CATARACT EXTRACTION W/PHACO Left 01/09/2020   Procedure: CATARACT EXTRACTION PHACO AND INTRAOCULAR LENS PLACEMENT (IOC);  Surgeon: Baruch Goldmann, MD;  Location: [facility];  Service: Ophthalmology;  Laterality: Left;  CDE: 9.73   DILATION AND CURETTAGE OF UTERUS     EMBOLECTOMY  01/29/2011   Procedure: EMBOLECTOMY;  Surgeon: Mal Misty, MD;  Location: [place];  Service: Vascular;  Laterality: Left;  Left Popliteal and Tibial Embolectomy with patch angioplasty   INCISION AND DRAINAGE ABSCESS N/A 04/06/2016   Procedure: INCISION AND DRAINAGE ABSCESS;  Surgeon: Jonnie Kind, MD;  Location: [facility];  Service: Gynecology;  Laterality: N/A;   LEFT HEART CATHETERIZATION WITH CORONARY ANGIOGRAM N/A 10/01/2012   Procedure: LEFT HEART CATHETERIZATION WITH CORONARY ANGIOGRAM;  Surgeon: Peter M Martinique, MD;  Location[facility] C[facility] LAB;  Service: Cardiovascular;  Laterality: N/A;   LOWER EXTREMITY ANGIOGRAM N/A 01/27/2011   Procedure: LOWER EXTREMITY ANGIOGRAM;  Surgeon: Rosetta Posner, MD;  Location[facility] CATH  LAB;  Service: Cardiovascular;  Laterality: N/A;   LOWER EXTREMITY ANGIOGRAM N/A 01/28/2011   Procedure: LOWER EXTREMITY ANGIOGRAM;  Surgeon: Conrad Chardon, MD;  Location: [facility] CATH LAB;  Service: Cardiovascular;  Laterality: N/A;   LOWER EXTREMITY ANGIOGRAM Left 01/29/2011   Procedure: LOWER EXTREMITY ANGIOGRAM;  Surgeon: Mal Misty, MD;  Location: [facility] CATH LAB;  Service: Cardiovascular;  Laterality: Left;   MULTIPLE TOOTH EXTRACTIONS     TEE WITHOUT CARDIOVERSION  04/15/2011   Procedure: TRANSESOPHAGEAL ECHOCARDIOGRAM (TEE);  Surgeon: [name], MD;  [facility];  Service: Cardiovascular;  Laterality: N/A;   WRIST SURGERY     Left    Social History   Socioeconomic History   Marital status: Single    Spouse name: Not on file   Number of children: Not on file   Years  of education: Not on file   Highest education level: Not on file  Occupational History   Not on file  Tobacco Use   Smoking status: Every Day    Packs/day: 0.50    Years: 21.00    Pack years: 10.50    Types: Cigarettes    Start date: 07/06/1989    Last attempt to quit: 01/30/2011    Years since quitting: 9.4   Smokeless tobacco: Never  Vaping Use   Vaping Use: Never used  Substance and Sexual Activity   Alcohol use: No    Alcohol/week: 0.0 standard drinks    Comment: rarely   Drug use: Yes    Frequency: 1.0 times per week    Types: Marijuana    Comment: Former   Sexual activity: Yes    Partners: Male    Comment: pt stated that her tubal was only 50% effective- "not cut and burned"  Other Topics Concern   Not on file  Social History Narrative   Unemployed.  Lives in [place] with [place], [place], and mother.  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   Social Determinants of Health   Financial Resource Strain: Not on file  Food Insecurity: Not on file  Transportation Needs: Not on file  Physical Activity: Not on file  Stress: Not on file  Social Connections: Not on file    Family History  Problem Relation Age of Onset   COPD Mother    Hypertension Mother    Diabetes Mother    Stroke Mother    Coronary artery disease Mother    Diabetes Father    Hypertension Father    Coronary artery disease Father    Anesthesia problems Neg Hx     Outpatient Encounter Medications as of 07/24/2020  Medication Sig   insulin aspart (NOVOLOG) 100 UNIT/ML FlexPen Inject 2-8 Units into the skin 3 (three) times daily with meals.   apixaban (ELIQUIS) 2.5 MG TABS tablet Take 1 tablet (2.5 mg total) by mouth 2 (two) times daily.   atorvastatin (LIPITOR) 80 MG tablet TAKE ONE TABLET (80MG  TOTAL) BY MOUTH DAILY AT 6PM   carvedilol (COREG) 6.25 MG tablet TAKE ONE TABLET (6.25MG  TOTAL) BY MOUTH TWO TIMES DAILY   Continuous Blood Gluc Receiver (DEXCOM G6 RECEIVER) DEVI Use as directed to  test blood glucose four times daily   Continuous Blood Gluc Sensor (DEXCOM G6 SENSOR) MISC Apply new sensor every 10 days as instructed   Continuous Blood Gluc Transmit (DEXCOM G6 TRANSMITTER) MISC 1 Piece by Does not apply route as directed.   glucose blood (ACCU-CHEK GUIDE) test strip Use as instructed to  monitor glucose 4 times daily, before meals and before bed.   insulin glargine (LANTUS SOLOSTAR) 100 UNIT/ML Solostar Pen Inject 20 Units into the skin at bedtime.   Insulin Pen Needle (UNIFINE PENTIPS) 31G X 8 MM MISC USE TO INJECT INSULIN SUBCUTANEOUSLY . USE FOR BOTH SLIDING SCALE AND FOR DAILYINSULIN DOSES.   nitroGLYCERIN (NITROSTAT) 0.4 MG SL tablet Place 1 tablet (0.4 mg total) under the tongue every 5 (five) minutes as needed for chest pain.   ondansetron (ZOFRAN) 4 MG tablet Take 4 mg by mouth 4 (four) times daily as needed for nausea or vomiting.   oxyCODONE-acetaminophen (PERCOCET) 10-325 MG tablet Take 0.5 tablets every 4 (four) hours as needed by mouth for pain.   [DISCONTINUED] insulin aspart (NOVOLOG) 100 UNIT/ML injection Inject 2-8 Units into the skin 3 (three) times daily before meals.   [DISCONTINUED] Insulin Glargine (LANTUS SOLOSTAR) 100 UNIT/ML Solostar Pen Inject 10 Units at bedtime into the skin. (Patient taking differently: Inject 12 Units into the skin at bedtime.)   [DISCONTINUED] UNIFINE PENTIPS 31G X 8 MM MISC USE TO INJECT INSULIN SUBCUTANEOUSLY . USE FOR BOTH SLIDING SCALE AND FOR DAILYINSULIN DOSES.   No facility-administered encounter medications on file as of 07/24/2020.    ALLERGIES: Allergies  Allergen Reactions   Ibuprofen Other (See Comments)    Kidney disease   Losartan Swelling    VACCINATION STATUS: Immunization History  Administered Date(s) Administered   Influenza, Seasonal, Injecte, Preservative Fre 11/23/2018   Influenza,inj,Quad PF,6+ Mos 10/24/2014, 11/28/2019   Influenza-Unspecified 11/08/2011, 01/15/2013, 11/20/2015   Pneumococcal  Polysaccharide-23 11/08/2016    Diabetes She presents for her follow-up diabetic visit. She has type 1 diabetes mellitus. Onset time: She was diagnosed at approximate age of 15. Her disease course has been worsening. Hypoglycemia symptoms include confusion, nervousness/anxiousness, speech difficulty, sweats and tremors. Associated symptoms include blurred vision, fatigue, foot paresthesias, polydipsia, polyphagia, polyuria and weight loss. There are no hypoglycemic complications. (Has has several hypoglycemic events in the past where EMS was called.  She is extremely scared of becoming hypoglycemic again.) Symptoms are worsening. Diabetic complications include a CVA, heart disease, nephropathy, peripheral neuropathy and PVD. Risk factors for coronary artery disease include diabetes mellitus, dyslipidemia, hypertension, sedentary lifestyle and tobacco exposure. Current diabetic treatment includes intensive insulin program. She is compliant with treatment most of the time. Her weight is fluctuating minimally. She is following a generally unhealthy diet. When asked about meal planning, she reported none. She has not had a previous visit with a dietitian. She never (Has L AKA with prosthesis) participates in exercise. Her home blood glucose trend is increasing steadily. Her breakfast blood glucose range is generally >200 mg/dl. Her lunch blood glucose range is generally >200 mg/dl. Her dinner blood glucose range is generally >200 mg/dl. Her bedtime blood glucose range is generally >200 mg/dl. Her overall blood glucose range is >200 mg/dl. (She presents today, accompanied by her daughter, with her CGM, no logs showing persistently high glycemic profile overall.  Her POCT A1c today is 14%, worsening from last visit of 13%.  She did receive her Dexcom device and analysis shows TIR < 1%, TAR 99% (with 91 in very high range), TBR 0%.  She has only been injecting 12 units of the Lantus instead of the recommended 15  units.) An ACE inhibitor/angiotensin II receptor blocker is not being taken. She sees a podiatrist.Eye exam is current.  Hyperlipidemia This is a chronic problem. The current episode started more than 1 year ago. The problem  is uncontrolled. Recent lipid tests were reviewed and are variable. Exacerbating diseases include chronic renal disease and diabetes. Factors aggravating her hyperlipidemia include beta blockers, smoking and fatty foods. Current antihyperlipidemic treatment includes statins. Compliance problems include adherence to diet and adherence to exercise.  Risk factors for coronary artery disease include diabetes mellitus, dyslipidemia, hypertension and a sedentary lifestyle.  Hypertension This is a chronic problem. The current episode started more than 1 year ago. The problem has been waxing and waning since onset. The problem is controlled. Associated symptoms include blurred vision and sweats. There are no associated agents to hypertension. Risk factors for coronary artery disease include diabetes mellitus, dyslipidemia, smoking/tobacco exposure and sedentary lifestyle. Past treatments include beta blockers. The current treatment provides mild improvement. Compliance problems include diet and exercise.  Hypertensive end-organ damage includes kidney disease, CAD/MI, CVA, heart failure and PVD. Identifiable causes of hypertension include chronic renal disease.   Review of systems  Constitutional: + rapidly decreasing body weight,  current Body mass index is 20.41 kg/m. 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), no xerophthalmia ENT: no sore throat, no nodules palpated in throat, no dysphagia/odynophagia, no hoarseness Cardiovascular: no chest pain, no shortness of breath, no palpitations, no leg swelling Respiratory: no cough, no shortness of breath Gastrointestinal: no  nausea/vomiting/diarrhea Genitourinary: +polyuria, + polyphagia Musculoskeletal: no muscle/joint aches, Hx of L AKA- with prosthesis Skin: no rashes, no hyperemia Neurological: no tremors, no numbness, no tingling, no dizziness Psychiatric: no depression, no anxiety   Objective:    BP 123/70   Pulse 82   Ht 5\' 1"  (1.549 m)   Wt 108 lb (49 kg)   LMP 01/12/2011   BMI 20.41 kg/m   Wt Readings from Last 3 Encounters:  07/24/20 108 lb (49 kg)  04/03/20 117 lb (53.1 kg)  01/07/20 115 lb (52.2 kg)    BP Readings from Last 3 Encounters:  07/24/20 123/70  04/03/20 (!) 151/81  01/09/20 128/62    Physical Exam- Limited  Constitutional:  Body mass index is 20.41 kg/m. , not in acute distress, mildly anxious state of mind, tearful during visit Eyes:  EOMI, no exophthalmos Neck: Supple Cardiovascular: RRR, no murmurs, rubs, or gallops, no edema Respiratory: Adequate breathing efforts, no crackles, rales, rhonchi, or wheezing Musculoskeletal: L AKA with prosthesis, strength intact in all four extremities, no gross restriction of joint movements Skin:  no rashes, no hyperemia, + nicotinic discoloration to fingernails Neurological: no tremor with outstretched hands   Foot exam:  No rashes, ulcers, cuts, calluses, + onychodystrophy.  L BKA Good pulses to RLE Good sensation to 10 g monofilament to RLE   CMP ( most recent) CMP     Component Value Date/Time   NA 135 07/23/2020 0922   K 4.5 07/23/2020 0922   CL 97 07/23/2020 0922   CO2 23 07/23/2020 0922   GLUCOSE 346 (H) 07/23/2020 0922   GLUCOSE 262 (H) 01/07/2020 0944   BUN 25 (H) 07/23/2020 0922   CREATININE 2.03 (H) 07/23/2020 0922   CALCIUM 9.6 07/23/2020 0922   PROT 6.5 07/23/2020 0922   ALBUMIN 3.9 07/23/2020 0922   AST 20 07/23/2020 0922   ALT 22 07/23/2020 0922   ALKPHOS 145 (H) 07/23/2020 0922   BILITOT 0.5 07/23/2020 0922   GFRNONAA 30 (L) 01/07/2020 0944   GFRAA 28 (L) 09/28/2019 1456     Diabetic Labs  (most recent): Lab Results  Component  Value Date   HGBA1C 14.0 (A) 07/24/2020   HGBA1C 13.0 (A) 04/03/2020   HGBA1C 10.5 (H) 01/07/2020     Lipid Panel ( most recent) Lipid Panel     Component Value Date/Time   CHOL 185 07/23/2020 0922   TRIG 168 (H) 07/23/2020 0922   HDL 66 07/23/2020 0922   CHOLHDL 2.8 07/23/2020 0922   CHOLHDL 4.2 11/07/2016 0722   VLDL 30 11/07/2016 0722   LDLCALC 91 07/23/2020 0922   LABVLDL 28 07/23/2020 0922       Assessment & Plan:   1) Type 1 diabetes mellitus with other circulatory complication ([place])  She presents today, accompanied by her daughter, with her CGM, no logs showing persistently high glycemic profile overall.  Her POCT A1c today is 14%, worsening from last visit of 13%.  She did receive her Dexcom device and analysis shows TIR < 1%, TAR 99% (with 91 in very high range), TBR 0%.  She has only been injecting 12 units of the Lantus instead of the recommended 15 units.  - [name] has currently uncontrolled symptomatic type 2 DM since 50 years of age.  Recent labs reviewed.  - I had a long discussion with her about the progressive nature of diabetes and the pathology behind its complications. -her diabetes is complicated by CAD, PVD, CKD, CVA, MI, DKA and she remains at a high risk for more acute and chronic complications which include retinopathy, and neuropathy. These are all discussed in detail with her.  - Nutritional counseling repeated at each appointment due to patients tendency to fall back in to old habits.  - The patient admits there is a room for improvement in their diet and drink choices. -  Suggestion is made for the patient to avoid simple carbohydrates from their diet including Cakes, Sweet Desserts / Pastries, Ice Cream, Soda (diet and regular), Sweet Tea, Candies, Chips, Cookies, Sweet Pastries, Store Bought Juices, Alcohol in Excess of 1-2 drinks a day, Artificial Sweeteners, Coffee Creamer, and "Sugar-free"  Products. This will help patient to have stable blood glucose profile and potentially avoid unintended weight gain.   - I encouraged the patient to switch to unprocessed or minimally processed complex starch and increased protein intake (animal or plant source), fruits, and vegetables.   - Patient is advised to stick to a routine mealtimes to eat 3 meals a day and avoid unnecessary snacks (to snack only to correct hypoglycemia).  - she will be scheduled with Jearld Fenton, RDN, CDE for diabetes education.  - I have approached her with the following individualized plan to manage  her diabetes and patient agrees:   -Based on her significant hyperglycemia, she is advised to increase her Lantus to 20 units nightly.  She is also advised to adjust her Novolog from 2-8 units TID with meals to help cover prandial spikes.  Specific instructions on how to titrate insulin dose based on glucose readings given to patient and daughter in writing.    -She is encouraged to continue monitoring blood glucose 4 times per day using her CGM (which she was recently approved for), before meals and before bed, and notify the clinic if she has readings less than 70 or greater than 300 for 3 tests in a row.  She could benefit from CGM device for safety purposes to help avoid debilitating low glucose events.    - she is warned not to take insulin without proper monitoring per orders.  - Adjustment parameters are given to  her for hypo and hyperglycemia in writing.  - Specific targets for  A1c;  LDL, HDL,  and Triglycerides were discussed with the patient.  2) Blood Pressure /Hypertension:  Her blood pressure is controlled to target.  She is advised to continue Carvedilol 6.25 mg po twice daily.  Will defer any med changes to nephrology.  3) Lipids/Hyperlipidemia:  Her most recent lipid panel from 07/23/20 shows controlled LDL of 91 and elevated triglycerides of 168.  She is advised to continue Lipitor 80 mg po daily at  bedtime.  Side effects and precautions discussed with her.  She is also advised to avoid fried foods and butter.  4)  Weight/Diet: Her Body mass index is 20.41 kg/m.  -  she is not a candidate for weight loss.  Exercise, and detailed carbohydrates information provided  -  detailed on discharge instructions.  5) Chronic Care/Health Maintenance: -she is on Statin medications and is encouraged to initiate and continue to follow up with Ophthalmology, Dentist,  Podiatrist (filled out paperwork for diabetic shoes) at least yearly or according to recommendations, and advised to [place]. I have recommended yearly flu vaccine and pneumonia vaccine at least every 5 years; moderate intensity exercise for up to 150 minutes weekly; and  sleep for at least 7 hours a day.  Smoking cessation instruction/counseling given:  counseled patient on the dangers of tobacco use, advised patient to stop smoking, and reviewed strategies to maximize success   - she is advised to maintain close follow up with Lindell Spar, MD for primary care needs, as well as her other providers for optimal and coordinated care.     I spent 37 minutes in the care of the patient today including review of labs from [place], Lipids, Thyroid Function, Hematology (current and previous including abstractions from other facilities); face-to-face time discussing  her blood glucose readings/logs, discussing hypoglycemia and hyperglycemia episodes and symptoms, medications doses, her options of short and long term treatment based on the latest standards of care / guidelines;  discussion about incorporating lifestyle medicine;  and documenting the encounter.    Please refer to Patient Instructions for Blood Glucose Monitoring and Insulin/Medications Dosing Guide"  in media tab for additional information. Please  also refer to " Patient Self Inventory" in the Media  tab for reviewed elements of pertinent patient history.  [name]  participated in the discussions, expressed understanding, and voiced agreement with the above plans.  All questions were answered to her satisfaction. she is encouraged to contact clinic should she have any questions or concerns prior to her return visit.   Follow up plan: - Return in about 1 month (around 08/24/2020) for Diabetes F/U, Bring meter and logs, No previsit labs.    Rayetta Pigg, [facility] [address] [place], [place] [address] Phone: [phone] Fax: [phone]  07/24/2020, 2:10 PM

## 2020-07-24 NOTE — Patient Instructions (Signed)

## 2020-08-20 ENCOUNTER — Telehealth: Payer: Self-pay | Admitting: Nurse Practitioner

## 2020-08-20 NOTE — Telephone Encounter (Signed)
Left pt's daughter a message stating we have sent the PA request in and are waiting for response.

## 2020-08-20 NOTE — Telephone Encounter (Signed)
Pt daughter called and states they have been without dexcom sensor for 3 days waiting for PA. She uses [employer]

## 2020-08-25 ENCOUNTER — Telehealth: Payer: Self-pay | Admitting: Nurse Practitioner

## 2020-08-25 NOTE — Telephone Encounter (Signed)
Pt daughter is calling and states that they received a letter in the mail that her PA was denied for the dexcom transmitter and sensor.

## 2020-08-26 ENCOUNTER — Ambulatory Visit: Payer: Medicaid Other | Admitting: Nurse Practitioner

## 2020-08-26 NOTE — Telephone Encounter (Signed)
Left a message requesting a return call to the office. 

## 2020-08-26 NOTE — Telephone Encounter (Signed)
Pt cancelled appt. Awaiting return call.

## 2020-08-26 NOTE — Telephone Encounter (Signed)
Pt has appt today. Will discuss at time of visit.

## 2020-09-18 ENCOUNTER — Ambulatory Visit: Payer: Medicaid Other | Admitting: Nurse Practitioner

## 2020-09-24 ENCOUNTER — Ambulatory Visit: Payer: Medicaid Other | Admitting: Nurse Practitioner

## 2020-10-29 ENCOUNTER — Telehealth: Payer: Self-pay

## 2020-10-29 MED ORDER — ACCU-CHEK GUIDE VI STRP: ORAL_STRIP | 12 refills | Status: DC

## 2020-10-29 NOTE — Telephone Encounter (Signed)
Pt's daughter called to see if you could refill her Accu Chek Guide Test Strips. Last Office visit was July. She said as soon as she gets her car fixed she will bring her mom in for an appt. [place]

## 2020-11-04 ENCOUNTER — Other Ambulatory Visit: Payer: Self-pay | Admitting: *Deleted

## 2020-11-04 ENCOUNTER — Telehealth: Payer: Self-pay | Admitting: Internal Medicine

## 2020-11-04 NOTE — Telephone Encounter (Signed)
These are not prescribed by [name] will need to call cardiology for refills. Please notify pt

## 2020-11-04 NOTE — Telephone Encounter (Signed)
Pt daughter called in on pt behalf for prescription refills on  Carvedilol and Eliquis

## 2020-11-05 NOTE — Progress Notes (Signed)
Cardiology Office Note    Date:  11/06/2020   ID:  [name], DOB March 27, 1970, MRN [id number]  PCP:  Lindell Spar, MD  Cardiologist: Rozann Lesches, MD    Chief Complaint  Patient presents with   Follow-up    Overdue visit    History of Present Illness:    [name] is a 50 y.o. female with past medical history of CAD (s/p DES to LAD in 2014), HFrEF (EF 30-35% in 2018, at 45-50% by repeat imaging in 02/2019), PVD (s/p L BKA), carotid artery stenosis (known LICA occlusion), HTN, HLD, Stage 3-4 CKD, positive lupus anticoagulant and prior CVA who presents to the office today for overdue follow-up.   She was last examined by Dr. Domenic Polite in 02/2019 and she denied any recent anginal symptoms at that time. She was on long-term anticoagulation given her hypercoagulable state with positive lupus anticoagulant and protein C and protein S deficiency. Coreg was titrated to 6.25 mg twice daily and a follow-up echocardiogram was recommended for reassessment of her EF and could consider adding Hydralazine/Nitrates pending BP trend and reassessment of EF. Repeat echocardiogram did show that her EF had improved to 45 to 50% with moderate LVH, grade 1 diastolic dysfunction and no significant valve abnormalities. It was recommended that she follow-up in 3 months but she has not been evaluated by Cardiology since.  In talking with the patient and her daughter today, she reports overall doing well since her last visit. She denies any recent chest pain or palpitations. No recent dyspnea on exertion, orthopnea, PND or pitting edema. She is not overly active at baseline since her prior CVA and is mostly in a wheelchair at home and uses her prosthesis when going to appointments or to the store.   She reports good compliance with Eliquis and denies any evidence of active bleeding.    Past Medical History:  Diagnosis Date   Acquired absence of left leg below knee ([place])    Anxiety    Arterial  thromboembolism ([place])    Prior embolectomy, previously on Coumadin   Cataract    Phreesia 11/19/2019   Chronic kidney disease    Phreesia 11/19/2019   Clotting disorder ([place])    Phreesia 11/19/2019   Coronary atherosclerosis of native coronary artery    a. s/p DES to LAD in 2014   Depression    Depression    Phreesia 11/19/2019   Diabetes mellitus without complication ([place])    Phreesia 11/19/2019   Diabetic ketoacidosis with coma associated with type 1 diabetes mellitus ([place])    DKA, type 1 ([place]) 11/17/2016   Facial cellulitis    12/2010   Glomerulonephritis    Headache(784.0)    Hemiplegia, unspecified affecting right dominant side ([place])    History of stroke    Hyperlipidemia    Insulin dependent diabetes mellitus    History of diabetic ketoacidosis   Ischemic cardiomyopathy    a. EF 30-35% in 2018 b. EF at 45-50% by repeat imaging in 02/2019   Left carotid artery occlusion    Major depressive disorder, single episode, unspecified    Pancreatitis, acute 11/07/2016   Peripheral arterial disease ([place])    Shingles    11/2010   ST elevation myocardial infarction (STEMI) of anterior wall [facility])    Late presentation September 2014   Stroke [facility])    2018    Past Surgical History:  Procedure Laterality Date   AMPUTATION  03/03/2011   Procedure: AMPUTATION DIGIT;  Surgeon: Newt Minion, MD;  Location: [place];  Service: Orthopedics;  Laterality: Left;  Left Foot Amputation 4th and 5th toes at MTP joint   AMPUTATION  04/22/2011   Procedure: AMPUTATION BELOW KNEE;  Surgeon: Newt Minion, MD;  Location: [place];  Service: Orthopedics;  Laterality: Left;  Left Below Knee Amputation   CATARACT EXTRACTION W/PHACO Left 01/09/2020   Procedure: CATARACT EXTRACTION PHACO AND INTRAOCULAR LENS PLACEMENT (IOC);  Surgeon: Baruch Goldmann, MD;  Location: [facility];  Service: Ophthalmology;  Laterality: Left;  CDE: 9.73   DILATION AND CURETTAGE OF UTERUS     EMBOLECTOMY  01/29/2011   Procedure:  EMBOLECTOMY;  Surgeon: Mal Misty, MD;  Location: [place];  Service: Vascular;  Laterality: Left;  Left Popliteal and Tibial Embolectomy with patch angioplasty   INCISION AND DRAINAGE ABSCESS N/A 04/06/2016   Procedure: INCISION AND DRAINAGE ABSCESS;  Surgeon: Jonnie Kind, MD;  Location: [facility];  Service: Gynecology;  Laterality: N/A;   LEFT HEART CATHETERIZATION WITH CORONARY ANGIOGRAM N/A 10/01/2012   Procedure: LEFT HEART CATHETERIZATION WITH CORONARY ANGIOGRAM;  Surgeon: Peter M Martinique, MD;  Location: [facility] CATH LAB;  Service: Cardiovascular;  Laterality: N/A;   LOWER EXTREMITY ANGIOGRAM N/A 01/27/2011   Procedure: LOWER EXTREMITY ANGIOGRAM;  Surgeon: Rosetta Posner, MD;  Location: [facility] CATH LAB;  Service: Cardiovascular;  Laterality: N/A;   LOWER EXTREMITY ANGIOGRAM N/A 01/28/2011   Procedure: LOWER EXTREMITY ANGIOGRAM;  Surgeon: Conrad Mapleton, MD;  Location: [facility] CATH LAB;  Service: Cardiovascular;  Laterality: N/A;   LOWER EXTREMITY ANGIOGRAM Left 01/29/2011   Procedure: LOWER EXTREMITY ANGIOGRAM;  Surgeon: Mal Misty, MD;  Location[facility] C[facility] LAB;  Service: Cardiovascular;  Laterality: Left;   MULTIPLE TOOTH EXTRACTIONS     TEE WITHOUT CARDIOVERSION  04/15/2011   Procedure: TRANSESOPHAGEAL ECHOCARDIOGRAM (TEE);  Surgeon: [name], MD;  [facility];  Service: Cardiovascular;  Laterality: N/A;   WRIST SURGERY     Left    Current Medications: Outpatient Medications Prior to Visit  Medication Sig Dispense Refill   glucose blood (ACCU-CHEK GUIDE) test strip Use as instructed to monitor glucose 4 times daily, before meals and before bed. 400 each 12   insulin aspart (NOVOLOG) 100 UNIT/ML FlexPen Inject 2-8 Units into the skin 3 (three) times daily with meals. 15 mL 3   insulin glargine (LANTUS SOLOSTAR) 100 UNIT/ML Solostar Pen Inject 20 Units into the skin at bedtime. 15 mL 11   Insulin Pen Needle (UNIFINE PENTIPS) 31G X 8 MM MISC USE TO INJECT INSULIN SUBCUTANEOUSLY . USE FOR  BOTH SLIDING SCALE AND FOR DAILYINSULIN DOSES. 100 each 11   ondansetron (ZOFRAN) 4 MG tablet Take 4 mg by mouth 4 (four) times daily as needed for nausea or vomiting.     oxyCODONE-acetaminophen (PERCOCET) 10-325 MG tablet Take 0.5 tablets every 4 (four) hours as needed by mouth for pain. 120 tablet 0   apixaban (ELIQUIS) 2.5 MG TABS tablet Take 1 tablet (2.5 mg total) by mouth 2 (two) times daily. 60 tablet 6   atorvastatin (LIPITOR) 80 MG tablet TAKE ONE TABLET (80MG  TOTAL) BY MOUTH DAILY AT 6PM 30 tablet 6   carvedilol (COREG) 6.25 MG tablet TAKE ONE TABLET (6.25MG  TOTAL) BY MOUTH TWO TIMES DAILY 60 tablet 0   nitroGLYCERIN (NITROSTAT) 0.4 MG SL tablet Place 1 tablet (0.4 mg total) under the tongue every 5 (five) minutes as needed for chest pain. 25 tablet 5   Continuous Blood Gluc  Receiver (DEXCOM G6 RECEIVER) DEVI Use as directed to test blood glucose four times daily (Patient not taking: Reported on 11/06/2020) 1 each 0   Continuous Blood Gluc Sensor (DEXCOM G6 SENSOR) MISC Apply new sensor every 10 days as instructed (Patient not taking: Reported on 11/06/2020) 3 each 6   Continuous Blood Gluc Transmit (DEXCOM G6 TRANSMITTER) MISC 1 Piece by Does not apply route as directed. (Patient not taking: Reported on 11/06/2020) 1 each 1   No facility-administered medications prior to visit.     Allergies:   Ibuprofen and Losartan   Social History   Socioeconomic History   Marital status: Single    Spouse name: Not on file   Number of children: Not on file   Years of education: Not on file   Highest education level: Not on file  Occupational History   Not on file  Tobacco Use   Smoking status: Every Day    Packs/day: 0.50    Years: 21.00    Pack years: 10.50    Types: Cigarettes    Start date: 07/06/1989    Last attempt to quit: 01/30/2011    Years since quitting: 9.7   Smokeless tobacco: Never  Vaping Use   Vaping Use: Never used  Substance and Sexual Activity   Alcohol use: No     Alcohol/week: 0.0 standard drinks    Comment: rarely   Drug use: Yes    Frequency: 1.0 times per week    Types: Marijuana    Comment: Former   Sexual activity: Yes    Partners: Male    Comment: pt stated that her tubal was only 50% effective- "not cut and burned"  Other Topics Concern   Not on file  Social History Narrative   Unemployed.  Lives in [place] with [place], [place], and mother.  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   Social Determinants of Health   Financial Resource Strain: Not on file  Food Insecurity: Not on file  Transportation Needs: Not on file  Physical Activity: Not on file  Stress: Not on file  Social Connections: Not on file     Family History:  The patient's family history includes COPD in her mother; Coronary artery disease in her father and mother; Diabetes in her father and mother; Hypertension in her father and mother; Stroke in her mother.   Review of Systems:    Please see the history of present illness.     All other systems reviewed and are otherwise negative except as noted above.   Physical Exam:    VS:  BP (!) 146/70   Pulse 73   Ht 5\' 1"  (1.549 m)   Wt 108 lb (49 kg)   LMP 01/12/2011   SpO2 98%   BMI 20.41 kg/m    General: Thin female appearing in no acute distress. Tearful at times.  Head: Normocephalic, atraumatic. Neck: Left carotid bruit noted. JVD not elevated.  Lungs: Respirations regular and unlabored, without wheezes or rales.  Heart: Regular rate and rhythm. No S3 or S4.  No murmur, no rubs, or gallops appreciated. Abdomen: Appears non-distended. No obvious abdominal masses. Msk:  Strength and tone appear normal for age. No obvious joint deformities or effusions. Extremities: No clubbing or cyanosis. No pitting edema. Left BKA.  Neuro: Alert and oriented X 3. Moves all extremities spontaneously. No focal deficits noted. Psych:  Responds to questions appropriately with a normal affect. Skin: No rashes or lesions  noted  Wt Readings  from Last 3 Encounters:  11/06/20 108 lb (49 kg)  07/24/20 108 lb (49 kg)  04/03/20 117 lb (53.1 kg)     Studies/Labs Reviewed:   EKG:  EKG is ordered today.  The ekg ordered today demonstrates NSR, HR 71 with diffuse TWI along inferior, anterior and lateral leads which has been noted intermittently on prior tracings dating back to 2018.  Recent Labs: 01/07/2020: Hemoglobin 13.9; Platelets 380 07/23/2020: ALT 22; BUN 25; Creatinine, Ser 2.03; Potassium 4.5; Sodium 135; TSH 3.860   Lipid Panel    Component Value Date/Time   CHOL 185 07/23/2020 0922   TRIG 168 (H) 07/23/2020 0922   HDL 66 07/23/2020 0922   CHOLHDL 2.8 07/23/2020 0922   CHOLHDL 4.2 11/07/2016 0722   VLDL 30 11/07/2016 0722   LDLCALC 91 07/23/2020 0922    Additional studies/ records that were reviewed today include:   Carotid Dopplers: 11/2016 Final Interpretation:  Right Carotid: There is no evicence of a significant stenosis in the right  ICA.   Left Carotid: The left ICA appears occluded.   Echocardiogram: 02/2019 IMPRESSIONS     1. LV apex is trabeculated. Left ventricular ejection fraction, by  estimation, is 45 to 50%. The left ventricle has mildly decreased  function. The left ventricle demonstrates regional wall motion  abnormalities (see scoring diagram/findings for  description). There is moderate left ventricular hypertrophy of the septal  segment. Left ventricular diastolic parameters are consistent with Grade I  diastolic dysfunction (impaired relaxation).   2. Right ventricular systolic function is normal. The right ventricular  size is normal.   3. The mitral valve is abnormal. No evidence of mitral valve  regurgitation.   4. The aortic valve is tricuspid. Aortic valve regurgitation is not  visualized. No aortic stenosis is present.   5. The inferior vena cava is normal in size with greater than 50%  respiratory variability, suggesting right atrial pressure of 3 mmHg.    Assessment:    1. Coronary artery disease involving native coronary artery of native heart without angina pectoris   2. Cardiomyopathy, ischemic   3. ICAO (internal carotid artery occlusion), left   4. Essential hypertension   5. Hyperlipidemia LDL goal <70   6. CKD (chronic kidney disease) stage 4, GFR 15-29 ml/min (HCC)   7. Hypercoagulable state ([place])      Plan:   In order of problems listed above:  1. CAD - She is s/p DES to LAD in 2014. She does have an abnormal EKG with diffuse TWI which was noted on prior tracings in 2018 but now more prominent. She denies any recent chest pain or dyspnea on exertion. Will plan to obtain a repeat echocardiogram as outlined below for reassessment of EF and wall motion. If EF is reduced, could consider a NST but she would not be an ideal catheterization candidate given her baseline Stage 3-4 CKD.  - Continue Atorvastatin 80mg  daily and Coreg 6.25mg  BID. She is not on ASA given the need for anticoagulation. Will update Rx for SL NTG.   2. HFmrEF - Her EF was previously at 30-35% in 2018, at 45-50% by repeat imaging in 02/2019. She was initially somewhat hesitant to undergo a repeat echocardiogram but was in agreement by the end of this encounter after discussing further with her and her daughter.  - She appears euvolemic on examination today. Continue Coreg 6.25mg  BID. If EF is reduced, could consider adding low-dose Hydralazine and nitrates. Not on ACE-I/ARB/ARNI/Spiro/SGLT2 inhibitor given her CKD.  Does take Lasix as needed which is managed by Nephrology.   3. Carotid artery stenosis  - She has a known LICA occlusion. Remains on anticoagulation and statin therapy.   4. HTN - Her BP is elevated to 146/70 today but they report her BP is typically well-controlled. Continue Coreg and will consider further medication adjustments pending echo results.   5. HLD - Her LDL was at 91 in 07/2020 but her daughter reports she was previously noncompliant  with taking medications regularly but does take as prescribed now. Continue Atorvastatin 80mg  daily and if LDL remains above goal, would recommend switching to Crestor 40mg  daily.   6.  Stage 3-4 CKD - Followed by Dr. Theador Hawthorne. Baseline creatinine 2.0 - 2.2.  7. Positive Lupus Anticoagulant/Protein C and Protein S Deficiency - She has been on anticoagulation with Eliquis 2.5mg  BID and this is the appropriate dose currently given her creatinine at 2.03 by most recent check and weight at 108 lbs.    Medication Adjustments/Labs and Tests Ordered: Current medicines are reviewed at length with the patient today.  Concerns regarding medicines are outlined above.  Medication changes, Labs and Tests ordered today are listed in the Patient Instructions below. Patient Instructions  Medication Instructions:  Your physician recommends that you continue on your current medications as directed. Please refer to the Current Medication list given to you today.  *If you need a refill on your cardiac medications before your next appointment, please call your pharmacy*   Lab Work: None If you have labs (blood work) drawn today and your tests are completely normal, you will receive your results only by: [place] (if you have MyChart) OR A paper copy in the mail If you have any lab test that is abnormal or we need to change your treatment, we will call you to review the results.   Testing/Procedures: Your physician has requested that you have an echocardiogram. Echocardiography is a painless test that uses sound waves to create images of your heart. It provides your doctor with information about the size and shape of your heart and how well your heart's chambers and valves are working. This procedure takes approximately one hour. There are no restrictions for this procedure.    Follow-Up: At [facility], you and your health needs are our priority.  As part of our continuing mission to provide you  with exceptional heart care, we have created designated Provider Care Teams.  These Care Teams include your primary Cardiologist (physician) and Advanced Practice Providers (APPs -  Physician Assistants and Nurse Practitioners) who all work together to provide you with the care you need, when you need it.   Your next appointment:   6 month(s)  The format for your next appointment:   In Person  Provider:   Rozann Lesches, MD or Bernerd Pho, PA-C    Signed, Erma Heritage, PA-C  11/06/2020 9:07 PM    [facility] [address] [place], [place] [address] Phone: [phone] Fax: [phone]

## 2020-11-06 ENCOUNTER — Ambulatory Visit (INDEPENDENT_AMBULATORY_CARE_PROVIDER_SITE_OTHER): Payer: Medicaid Other | Admitting: Student

## 2020-11-06 ENCOUNTER — Encounter: Payer: Self-pay | Admitting: Student

## 2020-11-06 ENCOUNTER — Other Ambulatory Visit: Payer: Self-pay

## 2020-11-06 VITALS — BP 146/70 | HR 73 | Ht 61.0 in | Wt 108.0 lb

## 2020-11-06 DIAGNOSIS — E785 Hyperlipidemia, unspecified: Secondary | ICD-10-CM

## 2020-11-06 DIAGNOSIS — I2510 Atherosclerotic heart disease of native coronary artery without angina pectoris: Secondary | ICD-10-CM | POA: Diagnosis not present

## 2020-11-06 DIAGNOSIS — D6859 Other primary thrombophilia: Secondary | ICD-10-CM

## 2020-11-06 DIAGNOSIS — I10 Essential (primary) hypertension: Secondary | ICD-10-CM | POA: Diagnosis not present

## 2020-11-06 DIAGNOSIS — I6522 Occlusion and stenosis of left carotid artery: Secondary | ICD-10-CM | POA: Diagnosis not present

## 2020-11-06 DIAGNOSIS — N184 Chronic kidney disease, stage 4 (severe): Secondary | ICD-10-CM

## 2020-11-06 DIAGNOSIS — I255 Ischemic cardiomyopathy: Secondary | ICD-10-CM | POA: Diagnosis not present

## 2020-11-06 MED ORDER — APIXABAN 2.5 MG PO TABS: 2.5000 mg | ORAL_TABLET | Freq: Two times a day (BID) | ORAL | 3 refills | Status: DC

## 2020-11-06 MED ORDER — CARVEDILOL 6.25 MG PO TABS: 6.2500 mg | ORAL_TABLET | Freq: Two times a day (BID) | ORAL | 3 refills | Status: DC

## 2020-11-06 MED ORDER — NITROGLYCERIN 0.4 MG SL SUBL: 0.4000 mg | SUBLINGUAL_TABLET | SUBLINGUAL | 3 refills | Status: DC | PRN

## 2020-11-06 MED ORDER — ATORVASTATIN CALCIUM 80 MG PO TABS: 80.0000 mg | ORAL_TABLET | Freq: Every day | ORAL | 3 refills | Status: DC

## 2020-11-06 NOTE — Patient Instructions (Signed)
Medication Instructions:  Your physician recommends that you continue on your current medications as directed. Please refer to the Current Medication list given to you today.  *If you need a refill on your cardiac medications before your next appointment, please call your pharmacy*   Lab Work: None If you have labs (blood work) drawn today and your tests are completely normal, you will receive your results only by: [place] (if you have MyChart) OR A paper copy in the mail If you have any lab test that is abnormal or we need to change your treatment, we will call you to review the results.   Testing/Procedures: Your physician has requested that you have an echocardiogram. Echocardiography is a painless test that uses sound waves to create images of your heart. It provides your doctor with information about the size and shape of your heart and how well your heart's chambers and valves are working. This procedure takes approximately one hour. There are no restrictions for this procedure.    Follow-Up: At [facility], you and your health needs are our priority.  As part of our continuing mission to provide you with exceptional heart care, we have created designated Provider Care Teams.  These Care Teams include your primary Cardiologist (physician) and Advanced Practice Providers (APPs -  Physician Assistants and Nurse Practitioners) who all work together to provide you with the care you need, when you need it.   Your next appointment:   6 month(s)  The format for your next appointment:   In Person  Provider:   Rozann Lesches, MD or Bernerd Pho, PA-C

## 2020-11-13 ENCOUNTER — Ambulatory Visit (INDEPENDENT_AMBULATORY_CARE_PROVIDER_SITE_OTHER): Payer: Medicaid Other | Admitting: Internal Medicine

## 2020-11-13 ENCOUNTER — Ambulatory Visit: Payer: Medicaid Other

## 2020-11-13 ENCOUNTER — Other Ambulatory Visit: Payer: Self-pay

## 2020-11-13 ENCOUNTER — Encounter: Payer: Self-pay | Admitting: Internal Medicine

## 2020-11-13 VITALS — BP 121/73 | HR 74 | Resp 18 | Ht 61.0 in | Wt 107.0 lb

## 2020-11-13 DIAGNOSIS — G8191 Hemiplegia, unspecified affecting right dominant side: Secondary | ICD-10-CM | POA: Diagnosis not present

## 2020-11-13 DIAGNOSIS — I2510 Atherosclerotic heart disease of native coronary artery without angina pectoris: Secondary | ICD-10-CM | POA: Diagnosis not present

## 2020-11-13 DIAGNOSIS — Z23 Encounter for immunization: Secondary | ICD-10-CM | POA: Diagnosis not present

## 2020-11-13 DIAGNOSIS — E1059 Type 1 diabetes mellitus with other circulatory complications: Secondary | ICD-10-CM | POA: Diagnosis not present

## 2020-11-13 DIAGNOSIS — I739 Peripheral vascular disease, unspecified: Secondary | ICD-10-CM | POA: Diagnosis not present

## 2020-11-13 NOTE — Assessment & Plan Note (Signed)
Related to type 1 DM S/p left AKA On Eliquis due to coagulopathy 

## 2020-11-13 NOTE — Progress Notes (Addendum)
Established Patient Office Visit  Subjective:  Patient ID: [name], female    DOB: [birth year]  Age: 50 y.o. 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  Her daughter was present during the visit.  Type I DM: Her last HbA1C was 14.0.  She has been taking Lantus 20 units at bedtime and follows insulin sliding scale.  Her daughter checks her blood sugars before meals and at bedtime, which have been variable.  She has been following up with endocrinology for it.  She had a stroke in 2018, with residual right-sided weakness, more profound in the upper extremity.  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 She requires hospital bed at home for better support and frequent repositioning to avoid any pressure ulcer. She is dependent for her ADLs.  She also has progressive CKD, last CMP reviewed.  She denies any dysuria, hematuria or urinary hesitancy.  She follows up with Dr. Theador Hawthorne.  She follows up with cardiologist for CAD and ischemic cardiomyopathy management.  She is on Coreg, blood pressure is well-controlled.  She denies any headache, dizziness, chest pain, dyspnea or palpitations.  She requests disability placard for her vehicle.  She also requires hospital bed and her urinary incontinence supplies -daughter will have the home health agency fax order set to our office. She has had left AKA.  She is currently wheelchair-bound.   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 s/p DES to LAD in 2014   Depression    Depression    Phreesia 11/19/2019   Diabetes mellitus without complication ([place])    Phreesia 11/19/2019   Diabetic ketoacidosis with coma associated with type 1 diabetes mellitus ([place])    DKA, type 1 ([place]) 11/17/2016   Facial cellulitis    12/2010   Glomerulonephritis    Headache(784.0)    Hemiplegia, unspecified affecting right dominant side ([place])    History of stroke    Hyperlipidemia    Insulin dependent diabetes mellitus    History of diabetic ketoacidosis   Ischemic cardiomyopathy    a. EF 30-35% in 2018 b. EF at 45-50% by repeat imaging in 02/2019   Left carotid artery occlusion    Major depressive disorder, single episode, unspecified    Pancreatitis, acute 11/07/2016   Peripheral arterial disease ([place])    Shingles    11/2010   ST elevation myocardial infarction (STEMI) of anterior wall [facility])    Late presentation September 2014   Stroke [facility])    2018    Past Surgical History:  Procedure Laterality  Date   AMPUTATION  03/03/2011   Procedure: AMPUTATION DIGIT;  Surgeon: Newt Minion, MD;  Location: [place];  Service: Orthopedics;  Laterality: Left;  Left Foot Amputation 4th and 5th toes at MTP joint   AMPUTATION  04/22/2011   Procedure: AMPUTATION BELOW KNEE;  Surgeon: Newt Minion, MD;  Location: [place];  Service: Orthopedics;  Laterality: Left;  Left Below Knee Amputation   CATARACT EXTRACTION W/PHACO Left 01/09/2020   Procedure: CATARACT EXTRACTION PHACO AND INTRAOCULAR LENS PLACEMENT (IOC);  Surgeon: Baruch Goldmann, MD;  Location: [facility];  Service: Ophthalmology;  Laterality:  Left;  CDE: 9.73   DILATION AND CURETTAGE OF UTERUS     EMBOLECTOMY  01/29/2011   Procedure: EMBOLECTOMY;  Surgeon: Mal Misty, MD;  Location: [place];  Service: Vascular;  Laterality: Left;  Left Popliteal and Tibial Embolectomy with patch angioplasty   INCISION AND DRAINAGE ABSCESS N/A 04/06/2016   Procedure: INCISION AND DRAINAGE ABSCESS;  Surgeon: Jonnie Kind, MD;  Location: [facility];  Service: Gynecology;  Laterality: N/A;   LEFT HEART CATHETERIZATION WITH CORONARY ANGIOGRAM N/A 10/01/2012   Procedure: LEFT HEART CATHETERIZATION WITH CORONARY ANGIOGRAM;  Surgeon: Peter M Martinique, MD;  Location: [facility] CATH LAB;  Service: Cardiovascular;  Laterality: N/A;   LOWER EXTREMITY ANGIOGRAM N/A 01/27/2011   Procedure: LOWER EXTREMITY ANGIOGRAM;  Surgeon: Rosetta Posner, MD;  Location: [facility] CATH LAB;  Service: Cardiovascular;  Laterality: N/A;   LOWER EXTREMITY ANGIOGRAM N/A 01/28/2011   Procedure: LOWER EXTREMITY ANGIOGRAM;  Surgeon: Conrad Heartwell, MD;  Location: [facility] CATH LAB;  Service: Cardiovascular;  Laterality: N/A;   LOWER EXTREMITY ANGIOGRAM Left 01/29/2011   Procedure: LOWER EXTREMITY ANGIOGRAM;  Surgeon: Mal Misty, MD;  Location: [facility] CATH LAB;  Service: Cardiovascular;  Laterality: Left;   MULTIPLE TOOTH EXTRACTIONS     TEE WITHOUT CARDIOVERSION  04/15/2011   Procedure: TRANSESOPHAGEAL ECHOCARDIOGRAM (TEE);  Surgeon: [name], MD;  [facility];  Service: Cardiovascular;  Laterality: N/A;   WRIST SURGERY     Left    Family History  Problem Relation Age of Onset   COPD Mother    Hypertension Mother    Diabetes Mother    Stroke Mother    Coronary artery disease Mother    Diabetes Father    Hypertension Father    Coronary artery disease Father    Anesthesia problems Neg Hx     Social History   Socioeconomic History   Marital status: Single    Spouse name: Not on file   Number of children: Not on file   Years of education: Not on file   Highest education level: Not  on file  Occupational History   Not on file  Tobacco Use   Smoking status: Every Day    Packs/day: 0.50    Years: 21.00    Pack years: 10.50    Types: Cigarettes    Start date: 07/06/1989    Last attempt to quit: 01/30/2011    Years since quitting: 9.7   Smokeless tobacco: Never  Vaping Use   Vaping Use: Never used  Substance and Sexual Activity   Alcohol use: No    Alcohol/week: 0.0 standard drinks    Comment: rarely   Drug use: Yes    Frequency: 1.0 times per week    Types: Marijuana    Comment: Former   Sexual activity: Yes    Partners: Male    Comment: pt stated that her tubal was  only 50% effective- "not cut and burned"  Other Topics Concern   Not on file  Social History Narrative   Unemployed.  Lives in [place] with [place], [place], and mother.  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   Social Determinants of Health   Financial Resource Strain: Not on file  Food Insecurity: Not on file  Transportation Needs: Not on file  Physical Activity: Not on file  Stress: Not on file  Social Connections: Not on file  Intimate Partner Violence: Not on file    Outpatient Medications Prior to Visit  Medication Sig Dispense Refill   apixaban (ELIQUIS) 2.5 MG TABS tablet Take 1 tablet (2.5 mg total) by mouth 2 (two) times daily. 180 tablet 3   atorvastatin (LIPITOR) 80 MG tablet Take 1 tablet (80 mg total) by mouth daily. 90 tablet 3   carvedilol (COREG) 6.25 MG tablet Take 1 tablet (6.25 mg total) by mouth 2 (two) times daily with a meal. 180 tablet 3   Continuous Blood Gluc Receiver (DEXCOM G6 RECEIVER) DEVI Use as directed to test blood glucose four times daily 1 each 0   Continuous Blood Gluc Sensor (DEXCOM G6 SENSOR) MISC Apply new sensor every 10 days as instructed 3 each 6   Continuous Blood Gluc Transmit (DEXCOM G6 TRANSMITTER) MISC 1 Piece by Does not apply route as directed. 1 each 1   glucose blood (ACCU-CHEK GUIDE) test strip Use as instructed to monitor  glucose 4 times daily, before meals and before bed. 400 each 12   insulin aspart (NOVOLOG) 100 UNIT/ML FlexPen Inject 2-8 Units into the skin 3 (three) times daily with meals. 15 mL 3   insulin glargine (LANTUS SOLOSTAR) 100 UNIT/ML Solostar Pen Inject 20 Units into the skin at bedtime. 15 mL 11   Insulin Pen Needle (UNIFINE PENTIPS) 31G X 8 MM MISC USE TO INJECT INSULIN SUBCUTANEOUSLY . USE FOR BOTH SLIDING SCALE AND FOR DAILYINSULIN DOSES. 100 each 11   nitroGLYCERIN (NITROSTAT) 0.4 MG SL tablet Place 1 tablet (0.4 mg total) under the tongue every 5 (five) minutes as needed for chest pain. 25 tablet 3   ondansetron (ZOFRAN) 4 MG tablet Take 4 mg by mouth 4 (four) times daily as needed for nausea or vomiting.     oxyCODONE-acetaminophen (PERCOCET) 10-325 MG tablet Take 0.5 tablets every 4 (four) hours as needed by mouth for pain. 120 tablet 0   No facility-administered medications prior to visit.    Allergies  Allergen Reactions   Ibuprofen Other (See Comments)    Kidney disease   Losartan Swelling    ROS Review of Systems  Constitutional:  Negative for chills and fever.  HENT:  Negative for congestion, sinus pressure, sinus pain and sore throat.   Eyes:  Positive for visual disturbance. Negative for pain and discharge.  Respiratory:  Negative for cough and shortness of breath.   Cardiovascular:  Negative for chest pain and palpitations.  Gastrointestinal:  Negative for abdominal pain, nausea and vomiting.  Endocrine: Negative for polydipsia and polyuria.  Genitourinary:  Negative for dysuria and hematuria.  Musculoskeletal:  Positive for arthralgias. Negative for neck pain and neck stiffness.  Skin:  Negative for rash.  Neurological:  Negative for dizziness and weakness.  Psychiatric/Behavioral:  Negative for agitation and behavioral problems. The patient is nervous/anxious.      Objective:    Physical Exam Vitals reviewed.  Constitutional:      General: She is not in acute  distress.  Appearance: She is not diaphoretic.  HENT:     Head: Normocephalic and atraumatic.     Nose: Nose normal.     Mouth/Throat:     Mouth: Mucous membranes are moist.  Eyes:     General: No scleral icterus.    Extraocular Movements: Extraocular movements intact.  Cardiovascular:     Rate and Rhythm: Normal rate and regular rhythm.     Pulses: Normal pulses.     Heart sounds: Normal heart sounds. No murmur heard. Pulmonary:     Breath sounds: Normal breath sounds. No wheezing or rales.  Abdominal:     Palpations: Abdomen is soft.     Tenderness: There is no abdominal tenderness.  Musculoskeletal:     Cervical back: Neck supple. No tenderness.     Comments: S/p left BKA, prosthesis in place  Skin:    General: Skin is warm.  Neurological:     General: No focal deficit present.     Mental Status: She is alert and oriented to person, place, and time.     Sensory: Sensory deficit present.     Motor: Weakness (Right LE) present.  Psychiatric:        Mood and Affect: Mood normal.        Behavior: Behavior normal.     BP 121/73 (BP Location: Left Arm, Patient Position: Sitting, Cuff Size: Normal)   Pulse 74   Resp 18   Ht _0  (1.549 m)   Wt 107 lb (48.5 kg)   LMP 01/12/2011   SpO2 98%   BMI 20.22 kg/m  Wt Readings from Last 3 Encounters:  11/13/20 107 lb (48.5 kg)  11/06/20 108 lb (49 kg)  07/24/20 108 lb (49 kg)     Health Maintenance Due  Topic Date Due   COVID-19 Vaccine (1) Never done   OPHTHALMOLOGY EXAM  Never done   Hepatitis C Screening  Never done   TETANUS/TDAP  Never done   Zoster Vaccines- Shingrix (1 of 2) Never done   PAP SMEAR-Modifier  Never done   COLONOSCOPY (Pts 45-7yr Insurance coverage will need to be confirmed)  Never done   Pneumococcal Vaccine 132639Years old (2 - PCV) 11/08/2017   MAMMOGRAM  07/07/2019   URINE MICROALBUMIN  11/27/2020    There are no preventive care reminders to display for this patient.  Lab Results   Component Value Date   TSH 3.860 07/23/2020   Lab Results  Component Value Date   WBC 8.7 01/07/2020   HGB 13.9 01/07/2020   HCT 43.0 01/07/2020   MCV 94.3 01/07/2020   PLT 380 01/07/2020   Lab Results  Component Value Date   NA 135 07/23/2020   K 4.5 07/23/2020   CO2 23 07/23/2020   GLUCOSE 346 (H) 07/23/2020   BUN 25 (H) 07/23/2020   CREATININE 2.03 (H) 07/23/2020   BILITOT 0.5 07/23/2020   ALKPHOS 145 (H) 07/23/2020   AST 20 07/23/2020   ALT 22 07/23/2020   PROT 6.5 07/23/2020   ALBUMIN 3.9 07/23/2020   CALCIUM 9.6 07/23/2020   ANIONGAP 7 01/07/2020   EGFR 29 (L) 07/23/2020   Lab Results  Component Value Date   CHOL 185 07/23/2020   Lab Results  Component Value Date   HDL 66 07/23/2020   Lab Results  Component Value Date   LDLCALC 91 07/23/2020   Lab Results  Component Value Date   TRIG 168 (H) 07/23/2020   Lab Results  Component Value Date  CHOLHDL 2.8 07/23/2020   Lab Results  Component Value Date   HGBA1C 14.0 (A) 07/24/2020      Assessment & Plan:   Problem List Items Addressed This Visit       Cardiovascular and Mediastinum   Coronary artery disease involving native heart without angina pectoris    S/p stent to LAD Not on any antiplatelet, on Eliquis Follows up with Cardiologist      PAD (peripheral artery disease) ([place])    Related to type 1 DM S/p left AKA On Eliquis due to coagulopathy        Endocrine   Diabetes mellitus type I ([place]) - Primary    Lab Results  Component Value Date   HGBA1C 14.0 (A) 07/24/2020  On Lantus 20 U qHS and ISS, followed by Endocrinology Advised to follow diabetic diet On statin Diabetic eye exam: In 10/2019, no retinopathy        Nervous and Auditory   Hemiplegia, unspecified affecting right dominant side ([place])    S/p CVA Has left AKA Wheelchair-bound currently Requires hospital bed, order will be faxed once received Handicap placard form filled      Other Visit Diagnoses     Need  for immunization against influenza       Relevant Orders   Flu Vaccine QUAD 20moIM (Fluarix, Fluzone & Alfiuria Quad PF) (Completed)       No orders of the defined types were placed in this encounter.   Follow-up: Return in about 6 months (around 05/14/2021) for Annual physical.    RLindell Spar MD

## 2020-11-13 NOTE — Assessment & Plan Note (Signed)
Lab Results  Component Value Date   HGBA1C 14.0 (A) 07/24/2020   On Lantus 20 U qHS and ISS, followed by Endocrinology Advised to follow diabetic diet On statin Diabetic eye exam: In 10/2019, no retinopathy

## 2020-11-13 NOTE — Assessment & Plan Note (Signed)
S/p CVA Has left AKA Wheelchair-bound currently Requires hospital bed, order will be faxed once received Handicap placard form filled

## 2020-11-13 NOTE — Assessment & Plan Note (Signed)
S/p stent to LAD Not on any antiplatelet, on Eliquis Follows up with Cardiologist 

## 2020-11-13 NOTE — Patient Instructions (Signed)
Please continue to take medications as prescribed.  Please follow up with Nephrologist and Endocrinologist as scheduled.

## 2020-11-19 ENCOUNTER — Encounter: Payer: Self-pay | Admitting: Internal Medicine

## 2020-11-19 ENCOUNTER — Other Ambulatory Visit: Payer: Self-pay | Admitting: *Deleted

## 2020-11-19 DIAGNOSIS — Z89512 Acquired absence of left leg below knee: Secondary | ICD-10-CM

## 2020-11-19 DIAGNOSIS — Z8673 Personal history of transient ischemic attack (TIA), and cerebral infarction without residual deficits: Secondary | ICD-10-CM

## 2020-11-19 DIAGNOSIS — I63132 Cerebral infarction due to embolism of left carotid artery: Secondary | ICD-10-CM

## 2020-12-10 ENCOUNTER — Encounter: Payer: Self-pay | Admitting: Internal Medicine

## 2020-12-19 ENCOUNTER — Other Ambulatory Visit: Payer: Self-pay

## 2020-12-19 ENCOUNTER — Ambulatory Visit (HOSPITAL_COMMUNITY)
Admission: RE | Admit: 2020-12-19 | Discharge: 2020-12-19 | Disposition: A | Discharge status: Home / Self Care | Payer: Medicaid Other | Source: Ambulatory Visit | Attending: Student | Admitting: Student

## 2020-12-19 DIAGNOSIS — I255 Ischemic cardiomyopathy: Secondary | ICD-10-CM

## 2020-12-19 LAB — ECHOCARDIOGRAM COMPLETE
Area-P 1/2: 2.34 cm2
Calc EF: 40.1 %
S' Lateral: 2.8 cm
Single Plane A2C EF: 35.8 %
Single Plane A4C EF: 41.5 %

## 2020-12-19 NOTE — Progress Notes (Signed)
*  PRELIMINARY RESULTS* Echocardiogram 2D Echocardiogram has been performed.  [name] 12/19/2020, 4:07 PM

## 2020-12-22 ENCOUNTER — Other Ambulatory Visit: Payer: Self-pay

## 2020-12-22 MED ORDER — HYDRALAZINE HCL 25 MG PO TABS: 12.5000 mg | ORAL_TABLET | Freq: Two times a day (BID) | ORAL | 3 refills | Status: DC

## 2021-04-08 ENCOUNTER — Other Ambulatory Visit: Payer: Self-pay | Admitting: Nurse Practitioner

## 2021-04-08 ENCOUNTER — Telehealth: Payer: Self-pay | Admitting: Nurse Practitioner

## 2021-04-08 DIAGNOSIS — E559 Vitamin D deficiency, unspecified: Secondary | ICD-10-CM

## 2021-04-08 DIAGNOSIS — E1059 Type 1 diabetes mellitus with other circulatory complications: Secondary | ICD-10-CM

## 2021-04-08 MED ORDER — LANTUS SOLOSTAR 100 UNIT/ML ~~LOC~~ SOPN: 20.0000 [IU] | PEN_INJECTOR | Freq: Every day | SUBCUTANEOUS | 0 refills | Status: DC

## 2021-04-08 MED ORDER — INSULIN ASPART 100 UNIT/ML FLEXPEN: 2.0000 [IU] | PEN_INJECTOR | Freq: Three times a day (TID) | SUBCUTANEOUS | 0 refills | Status: DC

## 2021-04-08 NOTE — Telephone Encounter (Signed)
done

## 2021-04-08 NOTE — Telephone Encounter (Signed)
Patient's daughter Vikki Ports called and asked for an appt. Whitney placed new orders. Vikki Ports will take her tomorrow to have fasting labs drawn and see [name] in office Monday. She said she is out of insulin and will borrow from a friend for tonight. Will you send in enough for her to get by until Monday? ?

## 2021-04-09 ENCOUNTER — Encounter: Payer: Self-pay | Admitting: Nurse Practitioner

## 2021-04-10 LAB — COMPREHENSIVE METABOLIC PANEL
ALT: 68 IU/L — ABNORMAL HIGH (ref 0–32)
AST: 66 IU/L — ABNORMAL HIGH (ref 0–40)
Albumin/Globulin Ratio: 1.3 (ref 1.2–2.2)
Albumin: 3.7 g/dL — ABNORMAL LOW (ref 3.8–4.9)
Alkaline Phosphatase: 110 IU/L (ref 44–121)
BUN/Creatinine Ratio: 15 (ref 9–23)
BUN: 33 mg/dL — ABNORMAL HIGH (ref 6–24)
Bilirubin Total: 0.3 mg/dL (ref 0.0–1.2)
CO2: 26 mmol/L (ref 20–29)
Calcium: 9.1 mg/dL (ref 8.7–10.2)
Chloride: 106 mmol/L (ref 96–106)
Creatinine, Ser: 2.22 mg/dL — ABNORMAL HIGH (ref 0.57–1.00)
Globulin, Total: 2.8 g/dL (ref 1.5–4.5)
Glucose: 88 mg/dL (ref 70–99)
Potassium: 5.4 mmol/L — ABNORMAL HIGH (ref 3.5–5.2)
Sodium: 147 mmol/L — ABNORMAL HIGH (ref 134–144)
Total Protein: 6.5 g/dL (ref 6.0–8.5)
eGFR: 26 mL/min/{1.73_m2} — ABNORMAL LOW (ref >59)

## 2021-04-10 LAB — LIPID PANEL
Chol/HDL Ratio: 2.6 ratio (ref 0.0–4.4)
Cholesterol, Total: 151 mg/dL (ref 100–199)
HDL: 59 mg/dL (ref >39)
LDL Chol Calc (NIH): 77 mg/dL (ref 0–99)
Triglycerides: 75 mg/dL (ref 0–149)
VLDL Cholesterol Cal: 15 mg/dL (ref 5–40)

## 2021-04-10 LAB — VITAMIN D 25 HYDROXY (VIT D DEFICIENCY, FRACTURES): Vit D, 25-Hydroxy: 16.2 ng/mL — ABNORMAL LOW (ref 30.0–100.0)

## 2021-04-10 LAB — TSH: TSH: 3.07 u[IU]/mL (ref 0.450–4.500)

## 2021-04-10 LAB — T4, FREE: Free T4: 1.03 ng/dL (ref 0.82–1.77)

## 2021-04-13 ENCOUNTER — Ambulatory Visit (INDEPENDENT_AMBULATORY_CARE_PROVIDER_SITE_OTHER): Payer: Medicaid Other | Admitting: Nurse Practitioner

## 2021-04-13 ENCOUNTER — Other Ambulatory Visit: Payer: Self-pay

## 2021-04-13 ENCOUNTER — Encounter: Payer: Self-pay | Admitting: Nurse Practitioner

## 2021-04-13 VITALS — BP 144/84 | HR 80 | Ht 61.0 in | Wt 103.0 lb

## 2021-04-13 DIAGNOSIS — E559 Vitamin D deficiency, unspecified: Secondary | ICD-10-CM | POA: Diagnosis not present

## 2021-04-13 DIAGNOSIS — E782 Mixed hyperlipidemia: Secondary | ICD-10-CM

## 2021-04-13 DIAGNOSIS — E1059 Type 1 diabetes mellitus with other circulatory complications: Secondary | ICD-10-CM | POA: Diagnosis not present

## 2021-04-13 DIAGNOSIS — I10 Essential (primary) hypertension: Secondary | ICD-10-CM | POA: Diagnosis not present

## 2021-04-13 LAB — POCT GLYCOSYLATED HEMOGLOBIN (HGB A1C): Hemoglobin A1C: 11.6 % — AB (ref 4.0–5.6)

## 2021-04-13 MED ORDER — DEXCOM G6 TRANSMITTER MISC: 3 refills | Status: DC

## 2021-04-13 MED ORDER — DEXCOM G6 SENSOR MISC: 3 refills | Status: DC

## 2021-04-13 MED ORDER — TRESIBA FLEXTOUCH 100 UNIT/ML ~~LOC~~ SOPN: 20.0000 [IU] | PEN_INJECTOR | Freq: Every day | SUBCUTANEOUS | 3 refills | Status: DC

## 2021-04-13 NOTE — Patient Instructions (Signed)
Diabetes Mellitus Emergency Preparedness Plan ?A diabetes emergency preparedness plan is a checklist to make sure you have everything you need to manage your diabetes in case of an emergency, such as an evacuation, natural disaster, national security emergency, or pandemic lockdown. ?Managing your diabetes is something you have to do all day every day. The American Diabetes Association and the [employer] of Endocrinology both recommend putting together an emergency diabetes kit. Your kit should include important information and documents as well as all the supplies you will need to manage your diabetes for at least 1 week. Store it in a portable, Engineer, materials. The best time to start making your emergency kit is now. ?How to make your emergency kit ?Collect information and documents ?Include the following information and documents in your kit: ?The type of diabetes you have. ?A copy of your health insurance cards and photo ID. ?A list of all your other medical conditions, allergies, and surgeries. ?A list of all your medicines and doses with the contact information for your pharmacy. Ask your health care provider for a list of your current medicines. ?Any recent lab results, including your latest hemoglobin A1C (HbA1C). ?The make, model, and serial number of your insulin pump, if you use one. Also include contact information for the manufacturer. ?Contact information for people who should be notified in case of an emergency. Include your health care provider's name, address, and phone number. ?Collect diabetes care items ?Include the following diabetes care items in your kit: ?At least a 1-week supply of: ?Oral medicines. ?Insulin. ?Blood glucose testing supplies. These include testing strips, lancets, and extra batteries for your blood glucose monitor and pump. ?A charger for the continuous glucose monitor (CGM) receiver and pump. ?Any extra supplies needed for your CGM or pump. ?A supply of  glucagon, glucose tablets, juice, soda, or hard candy in case of hypoglycemia. ?Coolers or cold packs. ?A safe container for syringes, needles, and lancets. ? ?Other preparations ?Other things to consider doing as part of your emergency plan: ?Make sure that your mobile phone is charged and that you have an extra charger, cable, or batteries. ?Choose a meeting place for family members. ?Wear a medical alert or ID bracelet. ?If you have a child with diabetes, make sure your child's school has a copy of his or her emergency plan, including the name of the staff member who will assist your child. ?Where to find more information ?American Diabetes Association: www.diabetes.org ?Centers for Disease Control and Prevention: blogs.[employer].com.cy ?Summary ?A diabetes emergency preparedness plan is a checklist to make sure you have everything you need in case of an emergency. ?Your kit should include important information and documents as well as all the supplies you will need to manage your condition for at least 1 week. ?Store your kit in a portable, Engineer, materials. ?The best time to start making your emergency kit is now. ?This information is not intended to replace advice given to you by your health care provider. Make sure you discuss any questions you have with your health care provider. ?Document Revised: 07/12/2019 Document Reviewed: 07/12/2019 ?Elsevier Patient Education ? [place]. ? ?

## 2021-04-13 NOTE — Progress Notes (Signed)
? ?                                               Endocrinology Follow Up Note  ?     04/13/2021, 11:43 AM ? ? ?Subjective:  ? ? Patient ID: [name], female    DOB: 01-Dec-1970.  ?[name] is being seen in follow up after being seen in consultation for management of currently uncontrolled symptomatic diabetes requested by  Lindell Spar, MD. ? ? ?Past Medical History:  ?Diagnosis Date  ? Acquired absence of left leg below knee (HCC)   ? Anxiety   ? 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 Prior embolectomy, previously on Coumadin  ? Cataract   ? Phreesia 11/19/2019  ? Chronic kidney disease   ? Phreesia 11/19/2019  ? Clotting disorder ([place])   ? Phreesia 11/19/2019  ? Coronary atherosclerosis of native coronary artery   ? a. s/p DES to LAD in 2014  ? Depression   ? Depression   ? Phreesia 11/19/2019  ? Diabetes mellitus without complication ([place])   ? Phreesia 11/19/2019  ? Diabetic ketoacidosis with coma associated with type 1 diabetes mellitus ([place])   ? DKA, type 1 ([place]) 11/17/2016  ? Facial cellulitis   ? 12/2010  ? Glomerulonephritis   ? Headache(784.0)   ? Hemiplegia, unspecified affecting right dominant side ([place])   ? History of stroke   ? Hyperlipidemia   ? Insulin dependent diabetes mellitus   ? History of diabetic ketoacidosis  ? Ischemic cardiomyopathy   ? a. EF 30-35% in 2018 b. EF at 45-50% by repeat imaging in 02/2019  ? Left carotid artery occlusion   ? Major depressive disorder, single episode, unspecified   ? Pancreatitis, acute 11/07/2016  ? Peripheral arterial disease ([place])   ? Shingles   ? 11/2010  ? ST elevation myocardial infarction (STEMI) of anterior wall ([place])   ? Late presentation September 2014  ? Stroke [facility])   ? 2018  ? ? ?Past Surgical History:  ?Procedure Laterality Date  ? AMPUTATION  03/03/2011  ? Procedure: AMPUTATION DIGIT;  Surgeon: Newt Minion, MD;  Location: [place];  Service: Orthopedics;  Laterality: Left;  Left Foot Amputation 4th and 5th toes at MTP  joint  ? AMPUTATION  04/22/2011  ? Procedure: AMPUTATION BELOW KNEE;  Surgeon: Newt Minion, MD;  Location: [place];  Service: Orthopedics;  Laterality: Left;  Left Below Knee Amputation  ? CATARACT EXTRACTION W/PHACO Left 01/09/2020  ? Procedure: CATARACT EXTRACTION PHACO AND INTRAOCULAR LENS PLACEMENT (IOC);  Surgeon: Baruch Goldmann, MD;  [facility];  Service: Ophthalmology;  Laterality: Left;  CDE: 9.73  ? DILATION AND CURETTAGE OF UTERUS    ? EMBOLECTOMY  01/29/2011  ? Procedure: EMBOLECTOMY;  Surgeon: Mal Misty, MD;  [facility];  Service: Vascular;  Laterality: Left;  Left Popliteal and Tibial Embolectomy with patch angioplasty  ? INCISION AND DRAINAGE ABSCESS N/A 04/06/2016  ? Procedure: INCISION AND DRAINAGE ABSCESS;  Surgeon: Jonnie Kind, MD;  [facility];  Service: Gynecology;  Laterality: N/A;  ? LEFT HEART CATHETERIZATION WITH CORONARY ANGIOGRAM N/A 10/01/2012  ? Procedure: LEFT HEART CATHETERIZATION WITH CORONARY ANGIOGRAM;  Surgeon: Peter M Martinique, MD;  Location: [facility] CATH LAB;  Service: Cardiovascular;  Laterality: N/A;  ? LOWER EXTREMITY ANGIOGRAM N/A 01/27/2011  ?  Procedure: LOWER EXTREMITY ANGIOGRAM;  Surgeon: Rosetta Posner, MD;  Location: [facility] CATH LAB;  Service: Cardiovascular;  Laterality: N/A;  ? LOWER EXTREMITY ANGIOGRAM N/A 01/28/2011  ? Procedure: LOWER EXTREMITY ANGIOGRAM;  Surgeon: Conrad Waco, MD;  Location: [facility] CATH LAB;  Service: Cardiovascular;  Laterality: N/A;  ? LOWER EXTREMITY ANGIOGRAM Left 01/29/2011  ? Procedure: LOWER EXTREMITY ANGIOGRAM;  Surgeon: Mal Misty, MD;  Location: [facility] CATH LAB;  Service: Cardiovascular;  Laterality: Left;  ? MULTIPLE TOOTH EXTRACTIONS    ? TEE WITHOUT CARDIOVERSION  04/15/2011  ? Procedure: TRANSESOPHAGEAL ECHOCARDIOGRAM (TEE);  Surgeon: [name], MD;  [facility];  Service: Cardiovascular;  Laterality: N/A;  ? WRIST SURGERY    ? Left  ? ? ?Social History  ? ?Socioeconomic History  ? Marital status: Single  ?  Spouse  name: Not on file  ? Number of children: Not on file  ? Years of education: Not on file  ? Highest education level: Not on file  ?Occupational History  ? Not on file  ?Tobacco Use  ? Smoking status: Every Day  ?  Packs/day: 0.50  ?  Years: 21.00  ?  Pack years: 10.50  ?  Types: Cigarettes  ?  Start date: 07/06/1989  ?  Last attempt to quit: 01/30/2011  ?  Years since quitting: 10.2  ? Smokeless tobacco: Never  ?Vaping Use  ? Vaping Use: Never used  ?Substance and Sexual Activity  ? Alcohol use: No  ?  Alcohol/week: 0.0 standard drinks  ?  Comment: rarely  ? Drug use: Yes  ?  Frequency: 1.0 times per week  ?  Types: Marijuana  ?  Comment: Former  ? Sexual activity: Yes  ?  Partners: Male  ?  Comment: pt stated that her tubal was only 50% effective- "not cut and burned"  ?Other Topics Concern  ? Not on file  ?Social History Narrative  ? Unemployed.  Lives in [place] with [place], [place], and mother.  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  ? ?Social Determinants of Health  ? ?Financial Resource Strain: Not on file  ?Food Insecurity: Not on file  ?Transportation Needs: Not on file  ?Physical Activity: Not on file  ?Stress: Not on file  ?Social Connections: Not on file  ? ? ?Family History  ?Problem Relation Age of Onset  ? COPD Mother   ? Hypertension Mother   ? Diabetes Mother   ? Stroke Mother   ? Coronary artery disease Mother   ? Diabetes Father   ? Hypertension Father   ? Coronary artery disease Father   ? Anesthesia problems Neg Hx   ? ? ?Outpatient Encounter Medications as of 04/13/2021  ?Medication Sig  ? Continuous Blood Gluc Sensor (DEXCOM G6 SENSOR) MISC Change sensor every 10 days as directed  ? Continuous Blood Gluc Transmit (DEXCOM G6 TRANSMITTER) MISC Change transmitter every 90 days as directed.  ? insulin degludec (TRESIBA FLEXTOUCH) 100 UNIT/ML FlexTouch Pen Inject 20 Units into the skin at bedtime.  ? apixaban (ELIQUIS) 2.5 MG TABS tablet Take 1 tablet (2.5 mg total) by mouth 2 (two) times  daily.  ? atorvastatin (LIPITOR) 80 MG tablet Take 1 tablet (80 mg total) by mouth daily.  ? carvedilol (COREG) 6.25 MG tablet Take 1 tablet (6.25 mg total) by mouth 2 (two) times daily with a meal.  ? glucose blood (ACCU-CHEK GUIDE) test strip Use as instructed to monitor glucose 4 times daily, before meals  and before bed.  ? hydrALAZINE (APRESOLINE) 25 MG tablet Take 0.5 tablets (12.5 mg total) by mouth 2 (two) times daily.  ? insulin aspart (NOVOLOG) 100 UNIT/ML FlexPen Inject 2-8 Units into the skin 3 (three) times daily with meals.  ? Insulin Pen Needle (UNIFINE PENTIPS) 31G X 8 MM MISC USE TO INJECT INSULIN SUBCUTANEOUSLY . USE FOR BOTH SLIDING SCALE AND FOR DAILYINSULIN DOSES.  ? nitroGLYCERIN (NITROSTAT) 0.4 MG SL tablet Place 1 tablet (0.4 mg total) under the tongue every 5 (five) minutes as needed for chest pain.  ? ondansetron (ZOFRAN) 4 MG tablet Take 4 mg by mouth 4 (four) times daily as needed for nausea or vomiting.  ? oxyCODONE-acetaminophen (PERCOCET) 10-325 MG tablet Take 0.5 tablets every 4 (four) hours as needed by mouth for pain.  ? [DISCONTINUED] Continuous Blood Gluc Receiver (DEXCOM G6 RECEIVER) DEVI Use as directed to test blood glucose four times daily (Patient not taking: Reported on 04/13/2021)  ? [DISCONTINUED] Continuous Blood Gluc Sensor (DEXCOM G6 SENSOR) MISC Apply new sensor every 10 days as instructed (Patient not taking: Reported on 04/13/2021)  ? [DISCONTINUED] Continuous Blood Gluc Transmit (DEXCOM G6 TRANSMITTER) MISC 1 Piece by Does not apply route as directed. (Patient not taking: Reported on 04/13/2021)  ? [DISCONTINUED] insulin glargine (LANTUS SOLOSTAR) 100 UNIT/ML Solostar Pen Inject 20 Units into the skin at bedtime.  ? ?No facility-administered encounter medications on file as of 04/13/2021.  ? ? ?ALLERGIES: ?Allergies  ?Allergen Reactions  ? Ibuprofen Other (See Comments)  ?  Kidney disease  ? Losartan Swelling  ? ? ?VACCINATION STATUS: ?Immunization History  ?Administered  Date(s) Administered  ? Influenza, Seasonal, Injecte, Preservative Fre 11/23/2018  ? Influenza,inj,Quad PF,6+ Mos 10/24/2014, 11/28/2019, 11/13/2020  ? Influenza-Unspecified 11/08/2011, 01/15/2013, 11/20/2015

## 2021-04-14 ENCOUNTER — Telehealth: Payer: Self-pay

## 2021-04-14 NOTE — Telephone Encounter (Signed)
Faxed the prior authorization to [employer] at [phone] and received a confirmation fax that they went through for her [place] and Transmitter and her [place] [employer] Flextouch insulin pen. Waiting for a response. ?

## 2021-05-01 ENCOUNTER — Ambulatory Visit (INDEPENDENT_AMBULATORY_CARE_PROVIDER_SITE_OTHER): Payer: Medicaid Other | Admitting: Family Medicine

## 2021-05-01 ENCOUNTER — Encounter: Payer: Self-pay | Admitting: Family Medicine

## 2021-05-01 VITALS — BP 126/74 | HR 78 | Ht 61.0 in | Wt 102.4 lb

## 2021-05-01 DIAGNOSIS — R1031 Right lower quadrant pain: Secondary | ICD-10-CM

## 2021-05-01 DIAGNOSIS — K279 Peptic ulcer, site unspecified, unspecified as acute or chronic, without hemorrhage or perforation: Secondary | ICD-10-CM

## 2021-05-01 DIAGNOSIS — B179 Acute viral hepatitis, unspecified: Secondary | ICD-10-CM | POA: Insufficient documentation

## 2021-05-01 DIAGNOSIS — N3001 Acute cystitis with hematuria: Secondary | ICD-10-CM | POA: Diagnosis not present

## 2021-05-01 DIAGNOSIS — R109 Unspecified abdominal pain: Secondary | ICD-10-CM | POA: Diagnosis not present

## 2021-05-01 DIAGNOSIS — N3090 Cystitis, unspecified without hematuria: Secondary | ICD-10-CM | POA: Insufficient documentation

## 2021-05-01 LAB — POCT URINALYSIS DIP (CLINITEK)
Bilirubin, UA: NEGATIVE
Glucose, UA: 100 mg/dL — AB
Ketones, POC UA: NEGATIVE mg/dL
Nitrite, UA: POSITIVE — AB
POC PROTEIN,UA: >=300 — AB
Spec Grav, UA: 1.025
Urobilinogen, UA: 0.2 U/dL
pH, UA: 6

## 2021-05-01 MED ORDER — SULFAMETHOXAZOLE-TRIMETHOPRIM 800-160 MG PO TABS: 1.0000 | ORAL_TABLET | Freq: Two times a day (BID) | ORAL | 0 refills | Status: AC

## 2021-05-01 MED ORDER — OMEPRAZOLE 20 MG PO CPDR: 20.0000 mg | DELAYED_RELEASE_CAPSULE | Freq: Every day | ORAL | 3 refills | Status: DC

## 2021-05-01 NOTE — Assessment & Plan Note (Signed)
-  Lipid panel odered ?

## 2021-05-01 NOTE — Patient Instructions (Addendum)
I appreciate the opportunity to provide care to you today! ? ?-please pick up your prescription for UTI and finish the course of treatment ?-Referral was made to GI today; they will call to set up an appt ?-Please go to [facility] to get u/s of your RUQ ?- please pick up your omeprazole to help with your abdominal pain by reducing  your stomach acid ? ? ?  ?It was a pleasure to see you and I look forward to continuing to work together on your health and well-being. ?Please do not hesitate to call the office if you need care or have questions about your care. ?  ?Have a wonderful day and week. ?With Gratitude, ?Alvira Monday MSN, FNP-BC ? ?

## 2021-05-01 NOTE — Progress Notes (Signed)
? ?Acute Office Visit ? ?Subjective:  ? ? Patient ID: [name], female    DOB: 03-15-1970, 51 y.o.   MRN: [id number] ? ?Chief Complaint  ?Patient presents with  ? Abdominal Pain  ?  Complains of constipation. Daughter states she noticed her eyes yellow yesterday. Would like a referral to GI.   ? ? ?HPI ?[name] is a 51 y.o F  with PMH of  T1DM, acute pancreatitis in the setting of glucose >500 in 2018,  stage 4 chronic kidney disease, and Peripheral arterial disease seen today with her daughter. The patient complains of chronic RUQ pain. States that she's been having pain for 3 years with no relief. Pt has attempted Tums, antacids, and analgesics without relief. She states that sometimes the pain is worse when she eats. The patient denies nausea, vomiting, diarrhea, fever, and chills.  ?The patient is T1DM but doesn't take her insulin regularly due to her lack of appetite. The daughter reported yellowing of the patient's eyes yesterday and would like a GI referral.  ? ?UTI: she denies urgency, frequency, dysuria, flank pain, and fever but reports lower abdominal pain ? ? ?Past Medical History:  ?Diagnosis Date  ? Acquired absence of left leg below knee (HCC)   ? Anxiety   ? 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 Prior embolectomy, previously on Coumadin  ? Cataract   ? Phreesia 11/19/2019  ? Chronic kidney disease   ? Phreesia 11/19/2019  ? Clotting disorder ([place])   ? Phreesia 11/19/2019  ? Coronary atherosclerosis of native coronary artery   ? a. s/p DES to LAD in 2014  ? Depression   ? Depression   ? Phreesia 11/19/2019  ? Diabetes mellitus without complication ([place])   ? Phreesia 11/19/2019  ? Diabetic ketoacidosis with coma associated with type 1 diabetes mellitus ([place])   ? DKA, type 1 ([place]) 11/17/2016  ? Facial cellulitis   ? 12/2010  ? Glomerulonephritis   ? Headache(784.0)   ? Hemiplegia, unspecified affecting right dominant side ([place])   ? History of stroke   ? Hyperlipidemia   ? Insulin dependent  diabetes mellitus   ? History of diabetic ketoacidosis  ? Ischemic cardiomyopathy   ? a. EF 30-35% in 2018 b. EF at 45-50% by repeat imaging in 02/2019  ? Left carotid artery occlusion   ? Major depressive disorder, single episode, unspecified   ? Pancreatitis, acute 11/07/2016  ? Peripheral arterial disease ([place])   ? Shingles   ? 11/2010  ? ST elevation myocardial infarction (STEMI) of anterior wall ([place])   ? Late presentation September 2014  ? Stroke [facility])   ? 2018  ? ? ?Past Surgical History:  ?Procedure Laterality Date  ? AMPUTATION  03/03/2011  ? Procedure: AMPUTATION DIGIT;  Surgeon: Newt Minion, MD;  Location: [place];  Service: Orthopedics;  Laterality: Left;  Left Foot Amputation 4th and 5th toes at MTP joint  ? AMPUTATION  04/22/2011  ? Procedure: AMPUTATION BELOW KNEE;  Surgeon: Newt Minion, MD;  Location: [place];  Service: Orthopedics;  Laterality: Left;  Left Below Knee Amputation  ? CATARACT EXTRACTION W/PHACO Left 01/09/2020  ? Procedure: CATARACT EXTRACTION PHACO AND INTRAOCULAR LENS PLACEMENT (IOC);  Surgeon: Baruch Goldmann, MD;  [facility];  Service: Ophthalmology;  Laterality: Left;  CDE: 9.73  ? DILATION AND CURETTAGE OF UTERUS    ? EMBOLECTOMY  01/29/2011  ? Procedure: EMBOLECTOMY;  Surgeon: Mal Misty, MD;  Location: [place];  Service: Vascular;  Laterality: Left;  Left Popliteal and Tibial Embolectomy with patch angioplasty  ? INCISION AND DRAINAGE ABSCESS N/A 04/06/2016  ? Procedure: INCISION AND DRAINAGE ABSCESS;  Surgeon: Jonnie Kind, MD;  [facility];  Service: Gynecology;  Laterality: N/A;  ? LEFT HEART CATHETERIZATION WITH CORONARY ANGIOGRAM N/A 10/01/2012  ? Procedure: LEFT HEART CATHETERIZATION WITH CORONARY ANGIOGRAM;  Surgeon: Peter M Martinique, MD;  Location: [facility] CATH LAB;  Service: Cardiovascular;  Laterality: N/A;  ? LOWER EXTREMITY ANGIOGRAM N/A 01/27/2011  ? Procedure: LOWER EXTREMITY ANGIOGRAM;  Surgeon: Rosetta Posner, MD;  Location: [facility] C[facility] LAB;  Service: Cardiovascular;   Laterality: N/A;  ? LOWER EXTREMITY ANGIOGRAM N/A 01/28/2011  ? Procedure: LOWER EXTREMITY ANGIOGRAM;  Surgeon: Conrad [place]uss[place], MD;  Location: [facility] CATH LAB;  Service: Cardiovascular;  Laterality: N/A;  ? LOWER EXTREMITY ANGIOGRAM Left 01/29/2011  ? Procedure: LOWER EXTREMITY ANGIOGRAM;  Surgeon: Mal Misty, MD;  Location: [facility] CATH LAB;  Service: Cardiovascular;  Laterality: Left;  ? MULTIPLE TOOTH EXTRACTIONS    ? TEE WITHOUT CARDIOVERSION  04/15/2011  ? Procedure: TRANSESOPHAGEAL ECHOCARDIOGRAM (TEE);  Surgeon: [name], MD;  [facility];  Service: Cardiovascular;  Laterality: N/A;  ? WRIST SURGERY    ? Left  ? ? ?Family History  ?Problem Relation Age of Onset  ? COPD Mother   ? Hypertension Mother   ? Diabetes Mother   ? Stroke Mother   ? Coronary artery disease Mother   ? Diabetes Father   ? Hypertension Father   ? Coronary artery disease Father   ? Anesthesia problems Neg Hx   ? ? ?Social History  ? ?Socioeconomic History  ? Marital status: Single  ?  Spouse name: Not on file  ? Number of children: Not on file  ? Years of education: Not on file  ? Highest education level: Not on file  ?Occupational History  ? Not on file  ?Tobacco Use  ? Smoking status: Every Day  ?  Packs/day: 0.50  ?  Years: 21.00  ?  Pack years: 10.50  ?  Types: Cigarettes  ?  Start date: 07/06/1989  ?  Last attempt to quit: 01/30/2011  ?  Years since quitting: 10.2  ? Smokeless tobacco: Never  ?Vaping Use  ? Vaping Use: Never used  ?Substance and Sexual Activity  ? Alcohol use: No  ?  Alcohol/week: 0.0 standard drinks  ?  Comment: rarely  ? Drug use: Yes  ?  Frequency: 1.0 times per week  ?  Types: Marijuana  ?  Comment: Former  ? Sexual activity: Yes  ?  Partners: Male  ?  Comment: pt stated that her tubal was only 50% effective- "not cut and burned"  ?Other Topics Concern  ? Not on file  ?Social History Narrative  ? Unemployed.  Lives in [place] with [place], [place], and mother.  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  ? ?Social Determinants of Health  ? ?Financial Resource Strain: Not on file  ?Food Insecurity: Not on file  ?Transportation Needs: Not on file  ?Physical Activity: Not on file  ?Stress: Not on file  ?Social Connections: Not on file  ?Intimate Partner Violence: Not on file  ? ? ?Outpatient Medications Prior to Visit  ?Medication Sig Dispense Refill  ? apixaban (ELIQUIS) 2.5 MG TABS tablet Take 1 tablet (2.5 mg total) by mouth 2 (two) times daily. 180 tablet 3  ? atorvastatin (LIPITOR) 80 MG tablet Take  1 tablet (80 mg total) by mouth daily. 90 tablet 3  ? carvedilol (COREG) 6.25 MG tablet Take 1 tablet (6.25 mg total) by mouth 2 (two) times daily with a meal. 180 tablet 3  ? Continuous Blood Gluc Sensor (DEXCOM G6 SENSOR) MISC Change sensor every 10 days as directed 9 each 3  ? Continuous Blood Gluc Transmit (DEXCOM G6 TRANSMITTER) MISC Change transmitter every 90 days as directed. 1 each 3  ? glucose blood (ACCU-CHEK GUIDE) test strip Use as instructed to monitor glucose 4 times daily, before meals and before bed. 400 each 12  ? hydrALAZINE (APRESOLINE) 25 MG tablet Take 0.5 tablets (12.5 mg total) by mouth 2 (two) times daily. 90 tablet 3  ? insulin aspart (NOVOLOG) 100 UNIT/ML FlexPen Inject 2-8 Units into the skin 3 (three) times daily with meals. 9 mL 0  ? insulin degludec (TRESIBA FLEXTOUCH) 100 UNIT/ML FlexTouch Pen Inject 20 Units into the skin at bedtime. 15 mL 3  ? Insulin Pen Needle (UNIFINE PENTIPS) 31G X 8 MM MISC USE TO INJECT INSULIN SUBCUTANEOUSLY . USE FOR BOTH SLIDING SCALE AND FOR DAILYINSULIN DOSES. 100 each 11  ? nitroGLYCERIN (NITROSTAT) 0.4 MG SL tablet Place 1 tablet (0.4 mg total) under the tongue every 5 (five) minutes as needed for chest pain. 25 tablet 3  ? ondansetron (ZOFRAN) 4 MG tablet Take 4 mg by mouth 4 (four) times daily as needed for nausea or vomiting.    ? oxyCODONE-acetaminophen (PERCOCET) 10-325 MG tablet Take 0.5 tablets every 4 (four) hours as needed by mouth for  pain. 120 tablet 0  ? ?No facility-administered medications prior to visit.  ? ? ?Allergies  ?Allergen Reactions  ? Ibuprofen Other (See Comments)  ?  Kidney disease  ? Losartan Swelling  ? ? ?Review of Systems

## 2021-05-01 NOTE — Assessment & Plan Note (Signed)
?-   Positive urine dipstick for leukocytes and nitrates ?-Patient is asymptomatic ?-Empirically treating with Bactrim due to urine dipstick findings ?-Pending culture ?

## 2021-05-02 LAB — BILIRUBIN, FRACTIONATED(TOT/DIR/INDIR): Bilirubin, Indirect: 0.19 mg/dL (ref 0.10–0.80)

## 2021-05-02 LAB — HEPATIC FUNCTION PANEL
ALT: 19 IU/L (ref 0–32)
AST: 14 IU/L (ref 0–40)
Albumin: 3.6 g/dL — ABNORMAL LOW (ref 3.8–4.9)
Alkaline Phosphatase: 100 IU/L (ref 44–121)
Bilirubin Total: 0.3 mg/dL (ref 0.0–1.2)
Bilirubin, Direct: 0.11 mg/dL (ref 0.00–0.40)
Total Protein: 6.1 g/dL (ref 6.0–8.5)

## 2021-05-02 LAB — GAMMA GT: GGT: 41 IU/L (ref 0–60)

## 2021-05-04 ENCOUNTER — Encounter: Payer: Self-pay | Admitting: Internal Medicine

## 2021-05-04 ENCOUNTER — Other Ambulatory Visit: Payer: Self-pay | Admitting: Family Medicine

## 2021-05-04 DIAGNOSIS — N3090 Cystitis, unspecified without hematuria: Secondary | ICD-10-CM

## 2021-05-04 LAB — URINE CULTURE

## 2021-05-04 MED ORDER — NITROFURANTOIN MONOHYD MACRO 100 MG PO CAPS: 100.0000 mg | ORAL_CAPSULE | Freq: Two times a day (BID) | ORAL | 0 refills | Status: AC

## 2021-05-05 ENCOUNTER — Encounter: Payer: Self-pay | Admitting: Family Medicine

## 2021-05-13 ENCOUNTER — Other Ambulatory Visit: Payer: Self-pay | Admitting: Nurse Practitioner

## 2021-05-13 ENCOUNTER — Ambulatory Visit (HOSPITAL_COMMUNITY): Payer: Medicaid Other

## 2021-05-13 ENCOUNTER — Encounter: Payer: Self-pay | Admitting: Nurse Practitioner

## 2021-05-13 ENCOUNTER — Ambulatory Visit (INDEPENDENT_AMBULATORY_CARE_PROVIDER_SITE_OTHER): Payer: Medicaid Other | Admitting: Nurse Practitioner

## 2021-05-13 VITALS — BP 125/71 | HR 69 | Ht 61.0 in | Wt 101.0 lb

## 2021-05-13 DIAGNOSIS — E559 Vitamin D deficiency, unspecified: Secondary | ICD-10-CM

## 2021-05-13 DIAGNOSIS — I10 Essential (primary) hypertension: Secondary | ICD-10-CM | POA: Diagnosis not present

## 2021-05-13 DIAGNOSIS — E1059 Type 1 diabetes mellitus with other circulatory complications: Secondary | ICD-10-CM | POA: Diagnosis not present

## 2021-05-13 DIAGNOSIS — E782 Mixed hyperlipidemia: Secondary | ICD-10-CM | POA: Diagnosis not present

## 2021-05-13 NOTE — Patient Instructions (Signed)
Diabetes Mellitus and Foot Care Foot care is an important part of your health, especially when you have diabetes. Diabetes may cause you to have problems because of poor blood flow (circulation) to your feet and legs, which can cause your skin to: Become thinner and drier. Break more easily. Heal more slowly. Peel and crack. You may also have nerve damage (neuropathy) in your legs and feet, causing decreased feeling in them. This means that you may not notice minor injuries to your feet that could lead to more serious problems. Noticing and addressing any potential problems early is the best way to prevent future foot problems. How to care for your feet Foot hygiene  Wash your feet daily with warm water and mild soap. Do not use hot water. Then, pat your feet and the areas between your toes until they are completely dry. Do not soak your feet as this can dry your skin. Trim your toenails straight across. Do not dig under them or around the cuticle. File the edges of your nails with an emery board or nail file. Apply a moisturizing lotion or petroleum jelly to the skin on your feet and to dry, brittle toenails. Use lotion that does not contain alcohol and is unscented. Do not apply lotion between your toes. Shoes and socks Wear clean socks or stockings every day. Make sure they are not too tight. Do not wear knee-high stockings since they may decrease blood flow to your legs. Wear shoes that fit properly and have enough cushioning. Always look in your shoes before you put them on to be sure there are no objects inside. To break in new shoes, wear them for just a few hours a day. This prevents injuries on your feet. Wounds, scrapes, corns, and calluses  Check your feet daily for blisters, cuts, bruises, sores, and redness. If you cannot see the bottom of your feet, use a mirror or ask someone for help. Do not cut corns or calluses or try to remove them with medicine. If you find a minor scrape,  cut, or break in the skin on your feet, keep it and the skin around it clean and dry. You may clean these areas with mild soap and water. Do not clean the area with peroxide, alcohol, or iodine. If you have a wound, scrape, corn, or callus on your foot, look at it several times a day to make sure it is healing and not infected. Check for: Redness, swelling, or pain. Fluid or blood. Warmth. Pus or a bad smell. General tips Do not cross your legs. This may decrease blood flow to your feet. Do not use heating pads or hot water bottles on your feet. They may burn your skin. If you have lost feeling in your feet or legs, you may not know this is happening until it is too late. Protect your feet from hot and cold by wearing shoes, such as at the beach or on hot pavement. Schedule a complete foot exam at least once a year (annually) or more often if you have foot problems. Report any cuts, sores, or bruises to your health care provider immediately. Where to find more information American Diabetes Association: www.diabetes.org Association of Diabetes Care & Education Specialists: www.diabeteseducator.org Contact a health care provider if: You have a medical condition that increases your risk of infection and you have any cuts, sores, or bruises on your feet. You have an injury that is not healing. You have redness on your legs or feet. You   feel burning or tingling in your legs or feet. You have pain or cramps in your legs and feet. Your legs or feet are numb. Your feet always feel cold. You have pain around any toenails. Get help right away if: You have a wound, scrape, corn, or callus on your foot and: You have pain, swelling, or redness that gets worse. You have fluid or blood coming from the wound, scrape, corn, or callus. Your wound, scrape, corn, or callus feels warm to the touch. You have pus or a bad smell coming from the wound, scrape, corn, or callus. You have a fever. You have a red  line going up your leg. Summary Check your feet every day for blisters, cuts, bruises, sores, and redness. Apply a moisturizing lotion or petroleum jelly to the skin on your feet and to dry, brittle toenails. Wear shoes that fit properly and have enough cushioning. If you have foot problems, report any cuts, sores, or bruises to your health care provider immediately. Schedule a complete foot exam at least once a year (annually) or more often if you have foot problems. This information is not intended to replace advice given to you by your health care provider. Make sure you discuss any questions you have with your health care provider. Document Revised: 07/26/2019 Document Reviewed: 07/26/2019 Elsevier Patient Education  2023 [employer] Inc.  

## 2021-05-13 NOTE — Progress Notes (Signed)
? ?                                               Endocrinology Follow Up Note  ?     05/13/2021, 1:33 PM ? ? ?Subjective:  ? ? Patient ID: [name], female    DOB: 1970-04-10.  ?[name] is being seen in follow up after being seen in consultation for management of currently uncontrolled symptomatic diabetes requested by  Lindell Spar, MD. ? ? ?Past Medical History:  ?Diagnosis Date  ? Acquired absence of left leg below knee (HCC)   ? Anxiety   ? 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 Prior embolectomy, previously on Coumadin  ? Cataract   ? Phreesia 11/19/2019  ? Chronic kidney disease   ? Phreesia 11/19/2019  ? Clotting disorder ([place])   ? Phreesia 11/19/2019  ? Coronary atherosclerosis of native coronary artery   ? a. s/p DES to LAD in 2014  ? Depression   ? Depression   ? Phreesia 11/19/2019  ? Diabetes mellitus without complication ([place])   ? Phreesia 11/19/2019  ? Diabetic ketoacidosis with coma associated with type 1 diabetes mellitus ([place])   ? DKA, type 1 ([place]) 11/17/2016  ? Facial cellulitis   ? 12/2010  ? Glomerulonephritis   ? Headache(784.0)   ? Hemiplegia, unspecified affecting right dominant side ([place])   ? History of stroke   ? Hyperlipidemia   ? Insulin dependent diabetes mellitus   ? History of diabetic ketoacidosis  ? Ischemic cardiomyopathy   ? a. EF 30-35% in 2018 b. EF at 45-50% by repeat imaging in 02/2019  ? Left carotid artery occlusion   ? Major depressive disorder, single episode, unspecified   ? Pancreatitis, acute 11/07/2016  ? Peripheral arterial disease ([place])   ? Shingles   ? 11/2010  ? ST elevation myocardial infarction (STEMI) of anterior wall ([place])   ? Late presentation September 2014  ? Stroke [facility])   ? 2018  ? ? ?Past Surgical History:  ?Procedure Laterality Date  ? AMPUTATION  03/03/2011  ? Procedure: AMPUTATION DIGIT;  Surgeon: Newt Minion, MD;  Location: [place];  Service: Orthopedics;  Laterality: Left;  Left Foot Amputation 4th and 5th toes at MTP joint   ? AMPUTATION  04/22/2011  ? Procedure: AMPUTATION BELOW KNEE;  Surgeon: Newt Minion, MD;  Location: [place];  Service: Orthopedics;  Laterality: Left;  Left Below Knee Amputation  ? CATARACT EXTRACTION W/PHACO Left 01/09/2020  ? Procedure: CATARACT EXTRACTION PHACO AND INTRAOCULAR LENS PLACEMENT (IOC);  Surgeon: Baruch Goldmann, MD;  [facility]: [facility];  Service: Ophthalmology;  Laterality: Left;  CDE: 9.73  ? DILATION AND CURETTAGE OF UTERUS    ? EMBOLECTOMY  01/29/2011  ? Procedure: EMBOLECTOMY;  Surgeon: Mal Misty, MD;  Location: [facility] OR;  Service: Vascular;  Laterality: Left;  Left Popliteal and Tibial Embolectomy with patch angioplasty  ? INCISION AND DRAINAGE ABSCESS N/A 04/06/2016  ? Procedure: INCISION AND DRAINAGE ABSCESS;  Surgeon: Jonnie Kind, MD;  [facility]: [facility];  Service: Gynecology;  Laterality: N/A;  ? LEFT HEART CATHETERIZATION WITH CORONARY ANGIOGRAM N/A 10/01/2012  ? Procedure: LEFT HEART CATHETERIZATION WITH CORONARY ANGIOGRAM;  Surgeon: Peter M Martinique, MD;  Location: [facility] CATH LAB;  Service: Cardiovascular;  Laterality: N/A;  ? LOWER EXTREMITY ANGIOGRAM N/A 01/27/2011  ?  Procedure: LOWER EXTREMITY ANGIOGRAM;  Surgeon: Rosetta Posner, MD;  Location: [facility] CATH LAB;  Service: Cardiovascular;  Laterality: N/A;  ? LOWER EXTREMITY ANGIOGRAM N/A 01/28/2011  ? Procedure: LOWER EXTREMITY ANGIOGRAM;  Surgeon: Conrad Tippecanoe, MD;  Location: [facility] CATH LAB;  Service: Cardiovascular;  Laterality: N/A;  ? LOWER EXTREMITY ANGIOGRAM Left 01/29/2011  ? Procedure: LOWER EXTREMITY ANGIOGRAM;  Surgeon: Mal Misty, MD;  Location: [facility] CATH LAB;  Service: Cardiovascular;  Laterality: Left;  ? MULTIPLE TOOTH EXTRACTIONS    ? TEE WITHOUT CARDIOVERSION  04/15/2011  ? Procedure: TRANSESOPHAGEAL ECHOCARDIOGRAM (TEE);  Surgeon: [name], MD;  [facility];  Service: Cardiovascular;  Laterality: N/A;  ? WRIST SURGERY    ? Left  ? ? ?Social History  ? ?Socioeconomic History  ? Marital status: Single  ?  Spouse name:  Not on file  ? Number of children: Not on file  ? Years of education: Not on file  ? Highest education level: Not on file  ?Occupational History  ? Not on file  ?Tobacco Use  ? Smoking status: Every Day  ?  Packs/day: 0.50  ?  Years: 21.00  ?  Pack years: 10.50  ?  Types: Cigarettes  ?  Start date: 07/06/1989  ?  Last attempt to quit: 01/30/2011  ?  Years since quitting: 10.2  ? Smokeless tobacco: Never  ?Vaping Use  ? Vaping Use: Never used  ?Substance and Sexual Activity  ? Alcohol use: No  ?  Alcohol/week: 0.0 standard drinks  ?  Comment: rarely  ? Drug use: Yes  ?  Frequency: 1.0 times per week  ?  Types: Marijuana  ?  Comment: Former  ? Sexual activity: Yes  ?  Partners: Male  ?  Comment: pt stated that her tubal was only 50% effective- "not cut and burned"  ?Other Topics Concern  ? Not on file  ?Social History Narrative  ? Unemployed.  Lives in [place] with [place], [place], and mother.  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  ? ?Social Determinants of Health  ? ?Financial Resource Strain: Not on file  ?Food Insecurity: Not on file  ?Transportation Needs: Not on file  ?Physical Activity: Not on file  ?Stress: Not on file  ?Social Connections: Not on file  ? ? ?Family History  ?Problem Relation Age of Onset  ? COPD Mother   ? Hypertension Mother   ? Diabetes Mother   ? Stroke Mother   ? Coronary artery disease Mother   ? Diabetes Father   ? Hypertension Father   ? Coronary artery disease Father   ? Anesthesia problems Neg Hx   ? ? ?Outpatient Encounter Medications as of 05/13/2021  ?Medication Sig  ? apixaban (ELIQUIS) 2.5 MG TABS tablet Take 1 tablet (2.5 mg total) by mouth 2 (two) times daily.  ? atorvastatin (LIPITOR) 80 MG tablet Take 1 tablet (80 mg total) by mouth daily.  ? carvedilol (COREG) 6.25 MG tablet Take 1 tablet (6.25 mg total) by mouth 2 (two) times daily with a meal.  ? ciprofloxacin (CIPRO) 500 MG tablet Take 500 mg by mouth 2 (two) times daily.  ? Continuous Blood Gluc Sensor (DEXCOM G6  SENSOR) MISC Change sensor every 10 days as directed  ? Continuous Blood Gluc Transmit (DEXCOM G6 TRANSMITTER) MISC Change transmitter every 90 days as directed.  ? glucose blood (ACCU-CHEK GUIDE) test strip Use as instructed to monitor glucose 4 times daily, before meals and before  bed.  ? hydrALAZINE (APRESOLINE) 25 MG tablet Take 0.5 tablets (12.5 mg total) by mouth 2 (two) times daily.  ? insulin aspart (NOVOLOG) 100 UNIT/ML FlexPen Inject 2-8 Units into the skin 3 (three) times daily with meals.  ? Insulin Pen Needle (UNIFINE PENTIPS) 31G X 8 MM MISC USE TO INJECT INSULIN SUBCUTANEOUSLY . USE FOR BOTH SLIDING SCALE AND FOR DAILYINSULIN DOSES.  ? LANTUS SOLOSTAR 100 UNIT/ML Solostar Pen Inject 20 Units into the skin at bedtime.  ? LINZESS 72 MCG capsule Take 72 mcg by mouth daily.  ? nitroGLYCERIN (NITROSTAT) 0.4 MG SL tablet Place 1 tablet (0.4 mg total) under the tongue every 5 (five) minutes as needed for chest pain.  ? omeprazole (PRILOSEC) 20 MG capsule Take 1 capsule (20 mg total) by mouth daily.  ? ondansetron (ZOFRAN) 4 MG tablet Take 4 mg by mouth 4 (four) times daily as needed for nausea or vomiting.  ? ondansetron (ZOFRAN-ODT) 4 MG disintegrating tablet Take 4 mg by mouth 2 (two) times daily as needed.  ? oxyCODONE-acetaminophen (PERCOCET) 10-325 MG tablet Take 0.5 tablets every 4 (four) hours as needed by mouth for pain.  ? insulin degludec (TRESIBA FLEXTOUCH) 100 UNIT/ML FlexTouch Pen Inject 20 Units into the skin at bedtime. (Patient not taking: Reported on 05/13/2021)  ? ?No facility-administered encounter medications on file as of 05/13/2021.  ? ? ?ALLERGIES: ?Allergies  ?Allergen Reactions  ? Ibuprofen Other (See Comments)  ?  Kidney disease  ? Losartan Swelling  ? ? ?VACCINATION STATUS: ?Immunization History  ?Administered Date(s) Administered  ? Influenza, Seasonal, Injecte, Preservative Fre 11/23/2018  ? Influenza,inj,Quad PF,6+ Mos 10/24/2014, 11/28/2019, 11/13/2020  ? Influenza-Unspecified  11/08/2011, 01/15/2013, 11/20/2015  ? Pneumococcal Polysaccharide-23 11/08/2016  ? ? ?Diabetes ?She presents for her follow-up diabetic visit. She has type 1 diabetes mellitus. Onset time: She was diagno

## 2021-05-14 ENCOUNTER — Encounter: Payer: Medicaid Other | Admitting: Internal Medicine

## 2021-05-20 ENCOUNTER — Ambulatory Visit (HOSPITAL_COMMUNITY): Payer: Medicaid Other

## 2021-05-27 ENCOUNTER — Other Ambulatory Visit: Payer: Self-pay | Admitting: Family Medicine

## 2021-05-27 DIAGNOSIS — R109 Unspecified abdominal pain: Secondary | ICD-10-CM

## 2021-05-27 MED ORDER — OMEPRAZOLE 20 MG PO CPDR: 20.0000 mg | DELAYED_RELEASE_CAPSULE | Freq: Every day | ORAL | 3 refills | Status: DC

## 2021-05-27 NOTE — Telephone Encounter (Signed)
Refill sent.

## 2021-06-03 ENCOUNTER — Ambulatory Visit: Payer: Medicaid Other | Admitting: Gastroenterology

## 2021-06-03 ENCOUNTER — Telehealth: Payer: Self-pay | Admitting: Gastroenterology

## 2021-06-03 ENCOUNTER — Telehealth: Payer: Self-pay

## 2021-06-03 ENCOUNTER — Encounter: Payer: Self-pay | Admitting: Gastroenterology

## 2021-06-03 DIAGNOSIS — K5900 Constipation, unspecified: Secondary | ICD-10-CM | POA: Diagnosis not present

## 2021-06-03 DIAGNOSIS — R112 Nausea with vomiting, unspecified: Secondary | ICD-10-CM

## 2021-06-03 DIAGNOSIS — R1010 Upper abdominal pain, unspecified: Secondary | ICD-10-CM | POA: Insufficient documentation

## 2021-06-03 DIAGNOSIS — R1310 Dysphagia, unspecified: Secondary | ICD-10-CM | POA: Diagnosis not present

## 2021-06-03 HISTORY — DX: Upper abdominal pain, unspecified: R10.10

## 2021-06-03 HISTORY — DX: Nausea with vomiting, unspecified: R11.2

## 2021-06-03 NOTE — Patient Instructions (Addendum)
Continue omeprazole '20mg'$  once daily before breakfast. ?Continue Linzess 53mg once daily before breakfast.  ?I will obtain copy of last ultrasound for review. We will be in touch. ?Upper endoscopy. See separate instructions. You will need to hold Eliquis 48 hours before procedure.  ?You likely have some delayed stomach emptying (gastroparesis) due to chronic diabetes and chronic pain medications. You can try eating small snacks/meals every 2-3 hours to try to control nausea. Eating large meals will likely make you sick. On bad days, you can switch to liquid diet or soft foods such as pudding/yogurt/mashed potatoes.  ? ? ? ? ?Gastroparesis ? ?Gastroparesis is a condition in which food takes longer than normal to empty from the stomach. This condition is also known as delayed gastric emptying. It is usually a long-term (chronic) condition. ?There is no cure, but there are treatments and things that you can do at home to help relieve symptoms. Treating the underlying condition that causes gastroparesis can also help relieve symptoms. ?What are the causes? ?In many cases, the cause of this condition is not known. Possible causes include: ?A hormone (endocrine) disorder, such as hypothyroidism or diabetes. ?A nervous system disease, such as Parkinson's disease or multiple sclerosis. ?Cancer, infection, or surgery that affects the stomach or vagus nerve. The vagus nerve runs from your chest, through your neck, and to the lower part of your brain. ?A connective tissue disorder, such as scleroderma. ?Certain medicines. ?What increases the risk? ?You are more likely to develop this condition if: ?You have certain disorders or diseases. These may include: ?An endocrine disorder. ?An eating disorder. ?Amyloidosis. ?Scleroderma. ?Parkinson's disease. ?Multiple sclerosis. ?Cancer or infection of the stomach or the vagus nerve. ?You have had surgery on your stomach or vagus nerve. ?You take certain medicines. ?You are  female. ?What are the signs or symptoms? ?Symptoms of this condition include: ?Feeling full after eating very little or a loss of appetite. ?Nausea, vomiting, or heartburn. ?Bloating of your abdomen. ?Inconsistent blood sugar (glucose) levels on blood tests. ?Unexplained weight loss. ?Acid from the stomach coming up into the esophagus (gastroesophageal reflux). ?Sudden tightening (spasm) of the stomach, which can be painful. ?Symptoms may come and go. Some people may not notice any symptoms. ?How is this diagnosed? ?This condition is diagnosed with tests, such as: ?Tests that check how long it takes food to move through the stomach and intestines. These tests include: ?Upper gastrointestinal (GI) series. For this test, you drink a liquid that shows up well on X-rays, and then X-rays are taken of your intestines. ?Gastric emptying scintigraphy. For this test, you eat food that contains a small amount of radioactive material, and then scans are taken. ?Wireless capsule GI monitoring system. For this test, you swallow a pill (capsule) that records information about how foods and fluid move through your stomach. ?Gastric manometry. For this test, a tube is passed down your throat and into your stomach to measure electrical and muscular activity. ?Endoscopy. For this test, a long, thin tube with a camera and light on the end is passed down your throat and into your stomach to check for problems in your stomach lining. ?Ultrasound. This test uses sound waves to create images of the inside of your body. This can help rule out gallbladder disease or pancreatitis as a cause of your symptoms. ?How is this treated? ?There is no cure for this condition, but treatment and home care may relieve symptoms. Treatment may include: ?Treating the underlying cause. ?Managing your symptoms by making  changes to your diet and exercise habits. ?Taking medicines to control nausea and vomiting and to stimulate stomach muscles. ?Getting food  through a feeding tube in the hospital. This may be done in severe cases. ?Having surgery to insert a device called a gastric electrical stimulator into your body. This device helps improve stomach emptying and control nausea and vomiting. ?Follow these instructions at home: ?Take over-the-counter and prescription medicines only as told by your health care provider. ?Follow instructions from your health care provider about eating or drinking restrictions. Your health care provider may recommend that you: ?Eat smaller meals more often. ?Eat low-fat foods. ?Eat low-fiber forms of high-fiber foods. For example, eat cooked vegetables instead of raw vegetables. ?Have only liquid foods instead of solid foods. Liquid foods are easier to digest. ?Drink enough fluid to keep your urine pale yellow. ?Exercise as often as told by your health care provider. ?Keep all follow-up visits. This is important. ?Contact a health care provider if you: ?Notice that your symptoms do not improve with treatment. ?Have new symptoms. ?Get help right away if you: ?Have severe pain in your abdomen that does not improve with treatment. ?Have nausea that is severe or does not go away. ?Vomit every time you drink fluids. ?Summary ?Gastroparesis is a long-term (chronic) condition in which food takes longer than normal to empty from the stomach. ?Symptoms include nausea, vomiting, heartburn, bloating of your abdomen, and loss of appetite. ?Eating smaller portions, low-fat foods, and low-fiber forms of high-fiber foods may help you manage your symptoms. ?Get help right away if you have severe pain in your abdomen. ?This information is not intended to replace advice given to you by your health care provider. Make sure you discuss any questions you have with your health care provider. ?Document Revised: 05/14/2019 Document Reviewed: 05/14/2019 ?Elsevier Patient Education ? [place]. ? ?

## 2021-06-03 NOTE — Telephone Encounter (Signed)
Patient seen in office today and is scheduled for EGD 07/17/21.  ? ?After further review of her records, I would like to make sure we have approval from cardiology to hold Eliquis 48 hours prior to EGD.   ? ?Tammy please start process. Thanks. [facility] clinical FYI. PLEASE DON'T CANCEL EGD. ?

## 2021-06-03 NOTE — Telephone Encounter (Signed)
Request for clearance has been submitted. Awaiting approval/denial.  ?

## 2021-06-03 NOTE — Telephone Encounter (Signed)
Noted, will await approval from cardiology to mail instructions. ?

## 2021-06-03 NOTE — Telephone Encounter (Signed)
Patient with diagnosis of + lupus anticoagulant, protein C and S deficiency, stroke, and prior thromboembolism s/p embolectomy on Eliquis for anticoagulation.   ? ?Procedure: EGD +/- dilation ?Date of procedure: 06/16/21 ? ?CrCl 57m/min ?Platelet count 380K ? ?Would recommend that pt only hold anticoagulation for 1 day prior to procedure given hypercoagulable state. If 2 day hold is still requested, would need MD clearance. ?
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
## 2021-06-03 NOTE — Telephone Encounter (Signed)
Attention: Preop ? ? ?We would like to request holding the following medication for patient please. ? ?Procedure: EGD +/- dilation ? ?Date: 06/16/2021 ? ?Medication to hold: Eliquis x 48 hrs ? ?Surgeon: Dr. Abbey Chatters ? ?Phone: [phone] ? ?Fax:  [phone] ? ?Type of Anesthesia: Propofol ? ? ? ? ? ? ?  ?

## 2021-06-04 NOTE — Telephone Encounter (Signed)
Will route to ordering GI PA for their input on whether they can proceed with EGD+/- dilation with 24 hour Eliquis hold given her hypercoagulable state.  Magda Paganini, please reply to P CV DIV PREOP. Thanks!

## 2021-06-05 NOTE — Telephone Encounter (Signed)
Dr. Abbey Chatters, see below. Patient on for EGD+/-ED. Dx: N/V, abd pain, pill dysphagia.   Are you ok with proceeding with holding Eliquis 24 hours only due to hypercoagulable state? She is currently on 2.'5mg'$  BID given weight and creatinine clearance.

## 2021-06-08 NOTE — Telephone Encounter (Signed)
Yes that is fine

## 2021-06-09 NOTE — Telephone Encounter (Signed)
   Primary Cardiologist: Rozann Lesches, MD  Chart reviewed as part of pre-operative protocol coverage. Given past medical history and time since last visit, based on ACC/AHA guidelines, [name] would be at acceptable risk for the planned procedure without further cardiovascular testing.   Eliquis have been reviewed by clinical pharmacist.  Patient may hold Eliquis for 24 hours prior to procedure. Agreement for this change in original request to hold DOAC for 48 hours has been approved by Dr. Abbey Chatters, gastroenterologist.   I will route this recommendation to the requesting party via [place] fax function and remove from pre-op pool.  Please call with questions.  [name], NP-C    06/09/2021, 8:05 AM [name] [address] Office [phone] Fax [phone]

## 2021-06-09 NOTE — Telephone Encounter (Signed)
[facility] clinical: please see input from cardiology. We need to let her know to hold Eliquis only 24 hours before her EGD. NOT 48 hours.\  Please send out new instructions as well.

## 2021-06-09 NOTE — Telephone Encounter (Signed)
See other telephone note.  

## 2021-06-09 NOTE — Telephone Encounter (Signed)
Called pt's daughter Evlyn Clines), informed her pt will need told Eliquis for 24 hours instead of 48 hours. New instructions and pre-op appt letter mailed.  Pt's daughter asked if pt's [name] results from [facility] has been received? She has been vomiting past 3 days.

## 2021-06-11 NOTE — Telephone Encounter (Signed)
Pt is needing Zofran sent in to [facility]. Manuela Schwartz, please contact [facility] and get status of U/S.

## 2021-06-11 NOTE — Telephone Encounter (Addendum)
I have not seen an U/S from [facility] yet. Can we request again?  Tammy, Can we make sure she has Zofran to use during episodes of n/v. She can use every 4 hours as needed. Let me know if she needs RX. Also while feeling nauseated, she should still with sips of liquids until nausea passes.

## 2021-06-11 NOTE — Telephone Encounter (Signed)
Pt does not want the disintegrating tablets.

## 2021-06-12 MED ORDER — ONDANSETRON HCL 4 MG PO TABS: ORAL_TABLET | ORAL | 1 refills | Status: DC

## 2021-06-12 NOTE — Telephone Encounter (Signed)
Pt was made aware.  

## 2021-06-12 NOTE — Addendum Note (Signed)
Addended by: Mahala Menghini on: 06/12/2021 07:48 AM   Modules accepted: Orders

## 2021-06-12 NOTE — Telephone Encounter (Signed)
I sent RX to last checked pharmacy, [facility].

## 2021-06-17 NOTE — Telephone Encounter (Signed)
I have sent another request for records.

## 2021-06-26 NOTE — Telephone Encounter (Signed)
Reviewed ultrasound report dated August 2022:  Gallbladder normal Common bile duct measures 0.36 cm 2 cm cyst right kidney   Nothing to explain abd pain or n/v. EGD as planned.

## 2021-06-26 NOTE — Telephone Encounter (Signed)
U/S was received and was given to Neil Crouch PA-C for review.

## 2021-06-26 NOTE — Telephone Encounter (Signed)
Please let pt know we have requested her u/s report multiple times and have received nothing. The only thing I could suggest is that she request them to send to [name].   Plans for EGD as scheduled.

## 2021-06-26 NOTE — Telephone Encounter (Signed)
Contacted [employer] and spoke with the nurse, she is faxing over the u/s momentarily.

## 2021-06-30 NOTE — Telephone Encounter (Signed)
Pt's daughter was made aware and verbalized understanding.  

## 2021-07-09 NOTE — Patient Instructions (Addendum)
[name]  07/09/2021     '@PREFPERIOPPHARMACY'$ @   Your procedure is scheduled on  07/17/2021.   Report to [facility] at  1100  A.M.   Call this number if you have problems the morning of surgery:  [phone]   Remember:  Follow the diet instructions given to you by the office.    Your last dose of eliquis should be on 07/14/2021.    Take 10 units of your night time insulin the night before your procedure.    DO NOT take any medications for diabetes the morning of your procedure.    Take these medicines the morning of surgery with A SIP OF WATER              elavil, coreg, prilosec, zofran, oxycodone.     Do not wear jewelry, make-up or nail polish.  Do not wear lotions, powders, or perfumes, or deodorant.  Do not shave 48 hours prior to surgery.  Men may shave face and neck.  Do not bring valuables to the hospital.  [facility] is not responsible for any belongings or valuables.  Contacts, dentures or bridgework may not be worn into surgery.  Leave your suitcase in the car.  After surgery it may be brought to your room.  For patients admitted to the hospital, discharge time will be determined by your treatment team.  Patients discharged the day of surgery will not be allowed to drive home and must have someone with them for 24 hours.    Special instructions:   DO NOT smoke tobacco or vape for 24 hours before your procedure.  Please read over the following fact sheets that you were given. Anesthesia Post-op Instructions and Care and Recovery After Surgery       Upper Endoscopy, Adult, Care After This sheet gives you information about how to care for yourself after your procedure. Your health care provider may also give you more specific instructions. If you have problems or questions, contact your health care provider. What can I expect after the procedure? After the procedure, it is common to have: A sore throat. Mild stomach pain or  discomfort. Bloating. Nausea. Follow these instructions at home:  Follow instructions from your health care provider about what to eat or drink after your procedure. Return to your normal activities as told by your health care provider. Ask your health care provider what activities are safe for you. Take over-the-counter and prescription medicines only as told by your health care provider. If you were given a sedative during the procedure, it can affect you for several hours. Do not drive or operate machinery until your health care provider says that it is safe. Keep all follow-up visits as told by your health care provider. This is important. Contact a health care provider if you have: A sore throat that lasts longer than one day. Trouble swallowing. Get help right away if: You vomit blood or your vomit looks like coffee grounds. You have: A fever. Bloody, black, or tarry stools. A severe sore throat or you cannot swallow. Difficulty breathing. Severe pain in your chest or abdomen. Summary After the procedure, it is common to have a sore throat, mild stomach discomfort, bloating, and nausea. If you were given a sedative during the procedure, it can affect you for several hours. Do not drive or operate machinery until your health care provider says that it is safe. Follow instructions from your health care  provider about what to eat or drink after your procedure. Return to your normal activities as told by your health care provider. This information is not intended to replace advice given to you by your health care provider. Make sure you discuss any questions you have with your health care provider. Document Revised: 11/10/2018 Document Reviewed: 06/06/2017 Elsevier Patient Education  [place]. Esophageal Dilatation Esophageal dilatation, also called esophageal dilation, is a procedure to widen or open a blocked or narrowed part of the esophagus. The esophagus is the part of  the body that moves food and liquid from the mouth to the stomach. You may need this procedure if: You have a buildup of scar tissue in your esophagus that makes it difficult, painful, or impossible to swallow. This can be caused by gastroesophageal reflux disease (GERD). You have cancer of the esophagus. There is a problem with how food moves through your esophagus. In some cases, you may need this procedure repeated at a later time to dilate the esophagus gradually. Tell a health care provider about: Any allergies you have. All medicines you are taking, including vitamins, herbs, eye drops, creams, and over-the-counter medicines. Any problems you or family members have had with anesthetic medicines. Any blood disorders you have. Any surgeries you have had. Any medical conditions you have. Any antibiotic medicines you are required to take before dental procedures. Whether you are pregnant or may be pregnant. What are the risks? Generally, this is a safe procedure. However, problems may occur, including: Bleeding due to a tear in the lining of the esophagus. A hole, or perforation, in the esophagus. What happens before the procedure? Ask your health care provider about: Changing or stopping your regular medicines. This is especially important if you are taking diabetes medicines or blood thinners. Taking medicines such as aspirin and ibuprofen. These medicines can thin your blood. Do not take these medicines unless your health care provider tells you to take them. Taking over-the-counter medicines, vitamins, herbs, and supplements. Follow instructions from your health care provider about eating or drinking restrictions. Plan to have a responsible adult take you home from the hospital or clinic. Plan to have a responsible adult care for you for the time you are told after you leave the hospital or clinic. This is important. What happens during the procedure? You may be given a medicine to  help you relax (sedative). A numbing medicine may be sprayed into the back of your throat, or you may gargle the medicine. Your health care provider may perform the dilatation using various surgical instruments, such as: Simple dilators. This instrument is carefully placed in the esophagus to stretch it. Guided wire bougies. This involves using an endoscope to insert a wire into the esophagus. A dilator is passed over this wire to enlarge the esophagus. Then the wire is removed. Balloon dilators. An endoscope with a small balloon is inserted into the esophagus. The balloon is inflated to stretch the esophagus and open it up. The procedure may vary among health care providers and hospitals. What can I expect after the procedure? Your blood pressure, heart rate, breathing rate, and blood oxygen level will be monitored until you leave the hospital or clinic. Your throat may feel slightly sore and numb. This will get better over time. You will not be allowed to eat or drink until your throat is no longer numb. When you are able to drink, urinate, and sit on the edge of the bed without nausea or dizziness, you  may be able to return home. Follow these instructions at home: Take over-the-counter and prescription medicines only as told by your health care provider. If you were given a sedative during the procedure, it can affect you for several hours. Do not drive or operate machinery until your health care provider says that it is safe. Plan to have a responsible adult care for you for the time you are told. This is important. Follow instructions from your health care provider about any eating or drinking restrictions. Do not use any products that contain nicotine or tobacco, such as cigarettes, e-cigarettes, and chewing tobacco. If you need help quitting, ask your health care provider. Keep all follow-up visits. This is important. Contact a health care provider if: You have a fever. You have pain that  is not relieved by medicine. Get help right away if: You have chest pain. You have trouble breathing. You have trouble swallowing. You vomit blood. You have black, tarry, or bloody stools. These symptoms may represent a serious problem that is an emergency. Do not wait to see if the symptoms will go away. Get medical help right away. Call your local emergency services (911 in the [place] Do not drive yourself to the hospital. Summary Esophageal dilatation, also called esophageal dilation, is a procedure to widen or open a blocked or narrowed part of the esophagus. Plan to have a responsible adult take you home from the hospital or clinic. For this procedure, a numbing medicine may be sprayed into the back of your throat, or you may gargle the medicine. Do not drive or operate machinery until your health care provider says that it is safe. This information is not intended to replace advice given to you by your health care provider. Make sure you discuss any questions you have with your health care provider. Document Revised: 05/23/2019 Document Reviewed: 05/23/2019 Elsevier Patient Education  [place] After This sheet gives you information about how to care for yourself after your procedure. Your health care provider may also give you more specific instructions. If you have problems or questions, contact your health care provider. What can I expect after the procedure? After the procedure, it is common to have: Tiredness. Forgetfulness about what happened after the procedure. Impaired judgment for important decisions. Nausea or vomiting. Some difficulty with balance. Follow these instructions at home: For the time period you were told by your health care provider:     Rest as needed. Do not participate in activities where you could fall or become injured. Do not drive or use machinery. Do not drink alcohol. Do not take sleeping pills or  medicines that cause drowsiness. Do not make important decisions or sign legal documents. Do not take care of children on your own. Eating and drinking Follow the diet that is recommended by your health care provider. Drink enough fluid to keep your urine pale yellow. If you vomit: Drink water, juice, or soup when you can drink without vomiting. Make sure you have little or no nausea before eating solid foods. General instructions Have a responsible adult stay with you for the time you are told. It is important to have someone help care for you until you are awake and alert. Take over-the-counter and prescription medicines only as told by your health care provider. If you have sleep apnea, surgery and certain medicines can increase your risk for breathing problems. Follow instructions from your health care provider about wearing your sleep device: Anytime you  are sleeping, including during daytime naps. While taking prescription pain medicines, sleeping medicines, or medicines that make you drowsy. Avoid smoking. Keep all follow-up visits as told by your health care provider. This is important. Contact a health care provider if: You keep feeling nauseous or you keep vomiting. You feel light-headed. You are still sleepy or having trouble with balance after 24 hours. You develop a rash. You have a fever. You have redness or swelling around the IV site. Get help right away if: You have trouble breathing. You have new-onset confusion at home. Summary For several hours after your procedure, you may feel tired. You may also be forgetful and have poor judgment. Have a responsible adult stay with you for the time you are told. It is important to have someone help care for you until you are awake and alert. Rest as told. Do not drive or operate machinery. Do not drink alcohol or take sleeping pills. Get help right away if you have trouble breathing, or if you suddenly become confused. This  information is not intended to replace advice given to you by your health care provider. Make sure you discuss any questions you have with your health care provider. Document Revised: 12/09/2020 Document Reviewed: 12/07/2018 Elsevier Patient Education  [place].

## 2021-07-14 ENCOUNTER — Encounter (HOSPITAL_COMMUNITY)
Admission: RE | Admit: 2021-07-14 | Discharge: 2021-07-14 | Disposition: A | Discharge status: Home / Self Care | Payer: Medicaid Other | Source: Ambulatory Visit | Attending: Internal Medicine | Admitting: Internal Medicine

## 2021-07-14 ENCOUNTER — Encounter (HOSPITAL_COMMUNITY): Payer: Self-pay

## 2021-07-14 VITALS — BP 161/78 | HR 81 | Temp 97.7°F | Ht 61.0 in | Wt 101.0 lb

## 2021-07-14 DIAGNOSIS — E1059 Type 1 diabetes mellitus with other circulatory complications: Secondary | ICD-10-CM | POA: Diagnosis not present

## 2021-07-14 DIAGNOSIS — Z01812 Encounter for preprocedural laboratory examination: Secondary | ICD-10-CM | POA: Insufficient documentation

## 2021-07-14 LAB — BASIC METABOLIC PANEL
Anion gap: 14 (ref 5–15)
BUN: 42 mg/dL — ABNORMAL HIGH (ref 6–20)
CO2: 22 mmol/L (ref 22–32)
Calcium: 9 mg/dL (ref 8.9–10.3)
Chloride: 101 mmol/L (ref 98–111)
Creatinine, Ser: 2.82 mg/dL — ABNORMAL HIGH (ref 0.44–1.00)
GFR, Estimated: 20 mL/min — ABNORMAL LOW (ref >60)
Glucose, Bld: 179 mg/dL — ABNORMAL HIGH (ref 70–99)
Potassium: 5.1 mmol/L (ref 3.5–5.1)
Sodium: 137 mmol/L (ref 135–145)

## 2021-07-17 ENCOUNTER — Ambulatory Visit (HOSPITAL_BASED_OUTPATIENT_CLINIC_OR_DEPARTMENT_OTHER): Payer: Medicaid Other | Admitting: Certified Registered Nurse Anesthetist

## 2021-07-17 ENCOUNTER — Encounter (HOSPITAL_COMMUNITY): Payer: Self-pay

## 2021-07-17 ENCOUNTER — Ambulatory Visit (HOSPITAL_COMMUNITY): Payer: Medicaid Other | Admitting: Certified Registered Nurse Anesthetist

## 2021-07-17 ENCOUNTER — Encounter (HOSPITAL_COMMUNITY)
Admission: RE | Disposition: A | Discharge status: Home / Self Care | Payer: Self-pay | Source: Home / Self Care | Attending: Internal Medicine

## 2021-07-17 ENCOUNTER — Ambulatory Visit (HOSPITAL_COMMUNITY)
Admission: RE | Admit: 2021-07-17 | Discharge: 2021-07-17 | Disposition: A | Discharge status: Home / Self Care | Payer: Medicaid Other | Attending: Internal Medicine | Admitting: Internal Medicine

## 2021-07-17 DIAGNOSIS — I132 Hypertensive heart and chronic kidney disease with heart failure and with stage 5 chronic kidney disease, or end stage renal disease: Secondary | ICD-10-CM | POA: Diagnosis not present

## 2021-07-17 DIAGNOSIS — I6930 Unspecified sequelae of cerebral infarction: Secondary | ICD-10-CM | POA: Diagnosis not present

## 2021-07-17 DIAGNOSIS — F17210 Nicotine dependence, cigarettes, uncomplicated: Secondary | ICD-10-CM

## 2021-07-17 DIAGNOSIS — N186 End stage renal disease: Secondary | ICD-10-CM | POA: Insufficient documentation

## 2021-07-17 DIAGNOSIS — Z794 Long term (current) use of insulin: Secondary | ICD-10-CM | POA: Diagnosis not present

## 2021-07-17 DIAGNOSIS — R634 Abnormal weight loss: Secondary | ICD-10-CM | POA: Insufficient documentation

## 2021-07-17 DIAGNOSIS — I509 Heart failure, unspecified: Secondary | ICD-10-CM | POA: Insufficient documentation

## 2021-07-17 DIAGNOSIS — K2950 Unspecified chronic gastritis without bleeding: Secondary | ICD-10-CM | POA: Insufficient documentation

## 2021-07-17 DIAGNOSIS — F32A Depression, unspecified: Secondary | ICD-10-CM | POA: Diagnosis not present

## 2021-07-17 DIAGNOSIS — I252 Old myocardial infarction: Secondary | ICD-10-CM | POA: Insufficient documentation

## 2021-07-17 DIAGNOSIS — Z7901 Long term (current) use of anticoagulants: Secondary | ICD-10-CM | POA: Insufficient documentation

## 2021-07-17 DIAGNOSIS — E1051 Type 1 diabetes mellitus with diabetic peripheral angiopathy without gangrene: Secondary | ICD-10-CM | POA: Diagnosis not present

## 2021-07-17 DIAGNOSIS — F419 Anxiety disorder, unspecified: Secondary | ICD-10-CM | POA: Insufficient documentation

## 2021-07-17 DIAGNOSIS — K2970 Gastritis, unspecified, without bleeding: Secondary | ICD-10-CM

## 2021-07-17 DIAGNOSIS — I2510 Atherosclerotic heart disease of native coronary artery without angina pectoris: Secondary | ICD-10-CM

## 2021-07-17 DIAGNOSIS — Z89512 Acquired absence of left leg below knee: Secondary | ICD-10-CM | POA: Insufficient documentation

## 2021-07-17 DIAGNOSIS — Z79899 Other long term (current) drug therapy: Secondary | ICD-10-CM | POA: Diagnosis not present

## 2021-07-17 DIAGNOSIS — R1011 Right upper quadrant pain: Secondary | ICD-10-CM | POA: Insufficient documentation

## 2021-07-17 DIAGNOSIS — K219 Gastro-esophageal reflux disease without esophagitis: Secondary | ICD-10-CM | POA: Insufficient documentation

## 2021-07-17 DIAGNOSIS — E1022 Type 1 diabetes mellitus with diabetic chronic kidney disease: Secondary | ICD-10-CM | POA: Insufficient documentation

## 2021-07-17 HISTORY — PX: BIOPSY: SHX5522

## 2021-07-17 HISTORY — PX: ESOPHAGOGASTRODUODENOSCOPY (EGD) WITH PROPOFOL: SHX5813

## 2021-07-17 LAB — GLUCOSE, CAPILLARY: Glucose-Capillary: 273 mg/dL — ABNORMAL HIGH (ref 70–99)

## 2021-07-17 SURGERY — ESOPHAGOGASTRODUODENOSCOPY (EGD) WITH PROPOFOL
Anesthesia: General

## 2021-07-17 MED ORDER — LIDOCAINE HCL (CARDIAC) PF 100 MG/5ML IV SOSY
PREFILLED_SYRINGE | INTRAVENOUS | Status: DC | PRN
  Administered 2021-07-17: 40 mg via INTRATRACHEAL

## 2021-07-17 MED ORDER — PROPOFOL 10 MG/ML IV BOLUS
INTRAVENOUS | Status: DC | PRN
  Administered 2021-07-17: 70 mg via INTRAVENOUS

## 2021-07-17 MED ORDER — LACTATED RINGERS IV SOLN
INTRAVENOUS | Status: DC
Start: 2021-07-17 — End: 2021-07-17
  Administered 2021-07-17: 1000 mL via INTRAVENOUS

## 2021-07-17 NOTE — Anesthesia Preprocedure Evaluation (Signed)
Anesthesia Evaluation  Patient identified by MRN, date of birth, ID band Patient awake    Reviewed: Allergy & Precautions, H&P , NPO status , Patient's Chart, lab work & pertinent test results, reviewed documented beta blocker date and time   Airway Mallampati: II  TM Distance: >3 FB Neck ROM: full    Dental no notable dental hx.    Pulmonary neg pulmonary ROS, Current Smoker and Patient abstained from smoking.,    Pulmonary exam normal breath sounds clear to auscultation       Cardiovascular Exercise Tolerance: Good + CAD, + Peripheral Vascular Disease and +CHF   Rhythm:regular Rate:Normal     Neuro/Psych  Headaches, PSYCHIATRIC DISORDERS Anxiety Depression CVA, Residual Symptoms    GI/Hepatic negative GI ROS, Neg liver ROS,   Endo/Other  negative endocrine ROSdiabetes  Renal/GU CRF and ESRFRenal disease  negative genitourinary   Musculoskeletal   Abdominal   Peds  Hematology negative hematology ROS (+)   Anesthesia Other Findings   Reproductive/Obstetrics negative OB ROS                             Anesthesia Physical Anesthesia Plan  ASA: 3  Anesthesia Plan: General   Post-op Pain Management:    Induction:   PONV Risk Score and Plan: Propofol infusion  Airway Management Planned:   Additional Equipment:   Intra-op Plan:   Post-operative Plan:   Informed Consent: I have reviewed the patients History and Physical, chart, labs and discussed the procedure including the risks, benefits and alternatives for the proposed anesthesia with the patient or authorized representative who has indicated his/her understanding and acceptance.     Dental Advisory Given  Plan Discussed with: CRNA  Anesthesia Plan Comments:         Anesthesia Quick Evaluation

## 2021-07-17 NOTE — Transfer of Care (Signed)
Immediate Anesthesia Transfer of Care Note  Patient: NIKHITA MENTZEL  Procedure(s) Performed: ESOPHAGOGASTRODUODENOSCOPY (EGD) WITH PROPOFOL BIOPSY  Patient Location: Short Stay  Anesthesia Type:General  Level of Consciousness: drowsy  Airway & Oxygen Therapy: Patient Spontanous Breathing  Post-op Assessment: Report given to RN and Post -op Vital signs reviewed and stable  Post vital signs: Reviewed and stable  Last Vitals:  Vitals Value Taken Time  BP 126/55 07/17/21 1212  Temp 37.2 C 07/17/21 1212  Pulse 80 07/17/21 1212  Resp 18 07/17/21 1212  SpO2 96 % 07/17/21 1212    Last Pain:  Vitals:   07/17/21 1212  TempSrc: Oral  PainSc: Asleep      Patients Stated Pain Goal: 6 (46/[id number]/81 2751)  Complications: No notable events documented.

## 2021-07-17 NOTE — Discharge Instructions (Signed)
EGD Discharge instructions Please read the instructions outlined below and refer to this sheet in the next few weeks. These discharge instructions provide you with general information on caring for yourself after you leave the hospital. Your doctor may also give you specific instructions. While your treatment has been planned according to the most current medical practices available, unavoidable complications occasionally occur. If you have any problems or questions after discharge, please call your doctor. ACTIVITY You may resume your regular activity but move at a slower pace for the next 24 hours.  Take frequent rest periods for the next 24 hours.  Walking will help expel (get rid of) the air and reduce the bloated feeling in your abdomen.  No driving for 24 hours (because of the anesthesia (medicine) used during the test).  You may shower.  Do not sign any important legal documents or operate any machinery for 24 hours (because of the anesthesia used during the test).  NUTRITION Drink plenty of fluids.  You may resume your normal diet.  Begin with a light meal and progress to your normal diet.  Avoid alcoholic beverages for 24 hours or as instructed by your caregiver.  MEDICATIONS You may resume your normal medications unless your caregiver tells you otherwise.  WHAT YOU CAN EXPECT TODAY You may experience abdominal discomfort such as a feeling of fullness or "gas" pains.  FOLLOW-UP Your doctor will discuss the results of your test with you.  SEEK IMMEDIATE MEDICAL ATTENTION IF ANY OF THE FOLLOWING OCCUR: Excessive nausea (feeling sick to your stomach) and/or vomiting.  Severe abdominal pain and distention (swelling).  Trouble swallowing.  Temperature over 101 F (37.8 C).  Rectal bleeding or vomiting of blood.    Your EGD revealed mild amount inflammation in your stomach.  I took biopsies of this to rule out infection with a bacteria called H. pylori.  Await pathology results, my  office will contact you. Continue on Omeprazole daily. Follow up with Magda Paganini in 3-4 months.    I hope you have a great rest of your week!  Elon Alas. Abbey Chatters, D.O. Gastroenterology and Hepatology [facility]

## 2021-07-17 NOTE — Op Note (Signed)
[facility] Patient Name: [name] Procedure Date: 07/17/2021 11:58 AM MRN: [id number] Date of Birth: [birth year] Attending MD: Elon Alas. Abbey Chatters DO CSN: [id number] Age: 51 Admit Type: Outpatient Procedure:                Upper GI endoscopy Indications:              Abdominal pain in the right upper quadrant, Nausea,                            Weight loss Providers:                Elon Alas. Abbey Chatters, DO, Everardo Pacific, Michae Kava Tech, Technician Referring MD:              Medicines:                See the Anesthesia note for documentation of the                            administered medications Complications:            No immediate complications. Estimated Blood Loss:     Estimated blood loss was minimal. Procedure:                Pre-Anesthesia Assessment:                           - The anesthesia plan was to use monitored                            anesthesia care (MAC).                           After obtaining informed consent, the endoscope was                            passed under direct vision. Throughout the                            procedure, the patient's blood pressure, pulse, and                            oxygen saturations were monitored continuously. The                            GIF-H190 ([id number]) scope was introduced through the                            mouth, and advanced to the second part of duodenum.                            The upper GI endoscopy was accomplished without                            difficulty. The patient  tolerated the procedure                            well. Scope In: 12:08:21 PM Scope Out: 12:10:36 PM Total Procedure Duration: 0 hours 2 minutes 15 seconds  Findings:      The Z-line was regular and was found 38 cm from the incisors.      There is no endoscopic evidence of bleeding, areas of erosion,       esophagitis, hiatal hernia, ulcerations or varices in the entire        esophagus.      Localized mild inflammation characterized by erythema was found in the       gastric body. Biopsies were taken with a cold forceps for Helicobacter       pylori testing.      The duodenal bulb, first portion of the duodenum and second portion of       the duodenum were normal. Impression:               - Z-line regular, 38 cm from the incisors.                           - Gastritis. Biopsied.                           - Normal duodenal bulb, first portion of the                            duodenum and second portion of the duodenum. Moderate Sedation:      Per Anesthesia Care Recommendation:           - Patient has a contact number available for                            emergencies. The signs and symptoms of potential                            delayed complications were discussed with the                            patient. Return to normal activities tomorrow.                            Written discharge instructions were provided to the                            patient.                           - Resume previous diet.                           - Continue present medications.                           - Await pathology results.                           -  Use Prilosec (omeprazole) 20 mg PO daily.                           - Return to GI clinic in 4 months. Procedure Code(s):        --- Professional ---                           [id number], Esophagogastroduodenoscopy, flexible,                            transoral; with biopsy, single or multiple Diagnosis Code(s):        --- Professional ---                           K29.70, Gastritis, unspecified, without bleeding                           R10.11, Right upper quadrant pain                           R11.0, Nausea                           R63.4, Abnormal weight loss CPT copyright 2019 American Medical Association. All rights reserved. The codes documented in this report are preliminary and upon coder review may  be  revised to meet current compliance requirements. Elon Alas. Abbey Chatters, DO Justice Abbey Chatters, DO 07/17/2021 12:14:42 PM This report has been signed electronically. Number of Addenda: 0

## 2021-07-17 NOTE — H&P (Signed)
Primary Care Physician:  Lindell Spar, MD Primary Gastroenterologist:  Dr. Abbey Chatters  Pre-Procedure History & Physical: HPI:  [name] is a 51 y.o. female is here for an EGD with GERD, N/V, weight loss.  Past Medical History:  Diagnosis Date   Acquired absence of left leg below knee ([place])    Anxiety    was on xanax for years but discontinued prior to prior PCP retiring as she was on opioids and was told she would not find someone to write for both   Arterial thromboembolism ([place])    Prior embolectomy, previously on Coumadin   Cataract    Phreesia 11/19/2019   Chronic kidney disease    Phreesia 11/19/2019   Clotting disorder ([place])    Phreesia 11/19/2019   Coronary atherosclerosis of native coronary artery    a. s/p DES to LAD in 2014   Depression    Depression    Phreesia 11/19/2019   Diabetes mellitus without complication ([place])    Phreesia 11/19/2019   Diabetic ketoacidosis with coma associated with type 1 diabetes mellitus ([place])    DKA, type 1 ([place]) 11/17/2016   Facial cellulitis    12/2010   Glomerulonephritis    Headache(784.0)    Hemiplegia, unspecified affecting right dominant side ([place])    History of stroke    Hyperlipidemia    Insulin dependent diabetes mellitus    History of diabetic ketoacidosis   Ischemic cardiomyopathy    a. EF 30-35% in 2018 b. EF at 45-50% by repeat imaging in 02/2019   Left carotid artery occlusion    Major depressive disorder, single episode, unspecified    Pancreatitis, acute 11/07/2016   Peripheral arterial disease ([place])    Shingles    11/2010   ST elevation myocardial infarction (STEMI) of anterior wall [facility])    Late presentation September 2014   Stroke [facility])    2018    Past Surgical History:  Procedure Laterality Date   AMPUTATION  03/03/2011   Procedure: AMPUTATION DIGIT;  Surgeon: Newt Minion, MD;  Location: [place];  Service: Orthopedics;  Laterality: Left;  Left Foot Amputation 4th and 5th toes at MTP joint    AMPUTATION  04/22/2011   Procedure: AMPUTATION BELOW KNEE;  Surgeon: Newt Minion, MD;  Location: [place];  Service: Orthopedics;  Laterality: Left;  Left Below Knee Amputation   CATARACT EXTRACTION W/PHACO Left 01/09/2020   Procedure: CATARACT EXTRACTION PHACO AND INTRAOCULAR LENS PLACEMENT (IOC);  Surgeon: Baruch Goldmann, MD;  Location: [facility];  Service: Ophthalmology;  Laterality: Left;  CDE: 9.73   DILATION AND CURETTAGE OF UTERUS     EMBOLECTOMY  01/29/2011   Procedure: EMBOLECTOMY;  Surgeon: Mal Misty, MD;  Location: [place];  Service: Vascular;  Laterality: Left;  Left Popliteal and Tibial Embolectomy with patch angioplasty   INCISION AND DRAINAGE ABSCESS N/A 04/06/2016   Procedure: INCISION AND DRAINAGE ABSCESS;  Surgeon: Jonnie Kind, MD;  Location: [facility];  Service: Gynecology;  Laterality: N/A;   LEFT HEART CATHETERIZATION WITH CORONARY ANGIOGRAM N/A 10/01/2012   Procedure: LEFT HEART CATHETERIZATION WITH CORONARY ANGIOGRAM;  Surgeon: Peter M Martinique, MD;  Location: [facility] CATH LAB;  Service: Cardiovascular;  Laterality: N/A;   LOWER EXTREMITY ANGIOGRAM N/A 01/27/2011   Procedure: LOWER EXTREMITY ANGIOGRAM;  Surgeon: Rosetta Posner, MD;  Location: [facility] CATH LAB;  Service: Cardiovascular;  Laterality: N/A;   LOWER EXTREMITY ANGIOGRAM N/A 01/28/2011   Procedure: LOWER EXTREMITY ANGIOGRAM;  Surgeon: Conrad St. James,  MD;  Location: [place] CATH LAB;  Service: Cardiovascular;  Laterality: N/A;   LOWER EXTREMITY ANGIOGRAM Left 01/29/2011   Procedure: LOWER EXTREMITY ANGIOGRAM;  Surgeon: Mal Misty, MD;  Location: [facility] CATH LAB;  Service: Cardiovascular;  Laterality: Left;   MULTIPLE TOOTH EXTRACTIONS     TEE WITHOUT CARDIOVERSION  04/15/2011   Procedure: TRANSESOPHAGEAL ECHOCARDIOGRAM (TEE);  Surgeon: [name], MD;  [facility];  Service: Cardiovascular;  Laterality: N/A;   WRIST SURGERY     Left    Prior to Admission medications   Medication Sig Start Date End Date Taking? Authorizing  Provider  amitriptyline (ELAVIL) 25 MG tablet Take 25 mg by mouth 2 (two) times a week. 07/02/21  Yes [provider]  apixaban (ELIQUIS) 2.5 MG TABS tablet Take 1 tablet (2.5 mg total) by mouth 2 (two) times daily. 11/06/20  Yes Strader, Bruno, PA-C  atorvastatin (LIPITOR) 80 MG tablet Take 1 tablet (80 mg total) by mouth daily. 11/06/20  Yes Strader, [place], PA-C  carvedilol (COREG) 6.25 MG tablet Take 1 tablet (6.25 mg total) by mouth 2 (two) times daily with a meal. 11/06/20  Yes Strader, [place] M, PA-C  hydrALAZINE (APRESOLINE) 25 MG tablet Take 0.5 tablets (12.5 mg total) by mouth 2 (two) times daily. 12/22/20  Yes Strader, [place], PA-C  insulin aspart (NOVOLOG) 100 UNIT/ML FlexPen Inject 2-8 Units into the skin 3 (three) times daily with meals. 04/08/21  Yes Reardon, Juanetta Beets, NP  LANTUS SOLOSTAR 100 UNIT/ML Solostar Pen Inject 15 Units into the skin at bedtime. 04/13/21  Yes [provider]  LINZESS 72 MCG capsule Take 72 mcg by mouth daily. 03/05/21  Yes [provider]  omeprazole (PRILOSEC) 20 MG capsule Take 1 capsule (20 mg total) by mouth daily. 05/27/21  Yes Alvira Monday, FNP  ondansetron (ZOFRAN) 4 MG tablet Take '4mg'$  po every 4-6 hours as needed for n/v. 06/12/21  Yes Mahala Menghini, PA-C  oxyCODONE-acetaminophen (PERCOCET) 10-325 MG tablet Take 0.5 tablets every 4 (four) hours as needed by mouth for pain. 11/24/16  Yes Regalado, Belkys A, MD  glucose blood (ACCU-CHEK GUIDE) test strip Use as instructed to monitor glucose 4 times daily, before meals and before bed. 10/29/20   Brita Romp, NP  Insulin Pen Needle (UNIFINE PENTIPS) 31G X 8 MM MISC USE TO INJECT INSULIN SUBCUTANEOUSLY . USE FOR BOTH SLIDING SCALE AND FOR DAILYINSULIN DOSES. 07/24/20   Brita Romp, NP  nitroGLYCERIN (NITROSTAT) 0.4 MG SL tablet Place 1 tablet (0.4 mg total) under the tongue every 5 (five) minutes as needed for chest pain. 11/06/20   Strader, Fransisco Hertz, PA-C   oxymetazoline (AFRIN) 0.05 % nasal spray Place 1 spray into both nostrils daily as needed for congestion.    [provider]    Allergies as of 06/03/2021 - Review Complete 06/03/2021  Allergen Reaction Noted   Ibuprofen Other (See Comments) 11/03/2010   Losartan Swelling 04/23/2011    Family History  Problem Relation Age of Onset   COPD Mother    Hypertension Mother    Diabetes Mother    Stroke Mother    Coronary artery disease Mother    Diabetes Father    Hypertension Father    Coronary artery disease Father    Kidney disease Father        on dialysis, died from heart attack while being dialzed   Pancreatitis Maternal Aunt    Cancer Maternal Aunt  unknown primary   Anesthesia problems Neg Hx    Colon cancer Neg Hx     Social History   Socioeconomic History   Marital status: Single    Spouse name: Not on file   Number of children: Not on file   Years of education: Not on file   Highest education level: Not on file  Occupational History   Not on file  Tobacco Use   Smoking status: Every Day    Packs/day: 0.50    Years: 21.00    Total pack years: 10.50    Types: Cigarettes    Start date: 07/06/1989   Smokeless tobacco: Never  Vaping Use   Vaping Use: Never used  Substance and Sexual Activity   Alcohol use: No    Alcohol/week: 0.0 standard drinks of alcohol    Comment: rarely   Drug use: Yes    Frequency: 1.0 times per week    Types: Marijuana   Sexual activity: Yes    Partners: Male    Comment: pt stated that her tubal was only 50% effective- "not cut and burned"  Other Topics Concern   Not on file  Social History Narrative   Unemployed.  Lives in [place] with [place], [place], and mother.  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   Social Determinants of Health   Financial Resource Strain: Not on file  Food Insecurity: Not on file  Transportation Needs: Not on file  Physical Activity: Not on file  Stress: Not on file  Social  Connections: Not on file  Intimate Partner Violence: Not on file    Review of Systems: See HPI, otherwise negative ROS  Physical Exam: Vital signs in last 24 hours: Temp:  [99.2 F (37.3 C)] 99.2 F (37.3 C) (06/30 1120) Pulse Rate:  [88] 88 (06/30 1120) Resp:  [20] 20 (06/30 1120) BP: (173)/(76) 173/76 (06/30 1120) SpO2:  [99 %] 99 % (06/30 1120) Weight:  [45.8 kg] 45.8 kg (06/30 1120)   General:   Alert,  Well-developed, well-nourished, pleasant and cooperative in NAD Head:  Normocephalic and atraumatic. Eyes:  Sclera clear, no icterus.   Conjunctiva pink. Ears:  Normal auditory acuity. Nose:  No deformity, discharge,  or lesions. Mouth:  No deformity or lesions, dentition normal. Neck:  Supple; no masses or thyromegaly. Lungs:  Clear throughout to auscultation.   No wheezes, crackles, or rhonchi. No acute distress. Heart:  Regular rate and rhythm; no murmurs, clicks, rubs,  or gallops. Abdomen:  Soft, nontender and nondistended. No masses, hepatosplenomegaly or hernias noted. Normal bowel sounds, without guarding, and without rebound.   Msk:  Symmetrical without gross deformities. Normal posture. Extremities:  Without clubbing or edema. Neurologic:  Alert and  oriented x4;  grossly normal neurologically. Skin:  Intact without significant lesions or rashes. Cervical Nodes:  No significant cervical adenopathy. Psych:  Alert and cooperative. Normal mood and affect.  Impression/Plan: [name] is here for an EGD with GERD, N/V, weight loss.   The risks of the procedure including infection, bleed, or perforation as well as benefits, limitations, alternatives and imponderables have been reviewed with the patient. Questions have been answered. All parties agreeable.

## 2021-07-18 NOTE — Anesthesia Postprocedure Evaluation (Signed)
Anesthesia Post Note  Patient: [name]  Procedure(s) Performed: ESOPHAGOGASTRODUODENOSCOPY (EGD) WITH PROPOFOL BIOPSY  Patient location during evaluation: Phase II Anesthesia Type: General Level of consciousness: awake Pain management: pain level controlled Vital Signs Assessment: post-procedure vital signs reviewed and stable Respiratory status: spontaneous breathing and respiratory function stable Cardiovascular status: blood pressure returned to baseline and stable Postop Assessment: no headache and no apparent nausea or vomiting Anesthetic complications: no Comments: Late entry   No notable events documented.   Last Vitals:  Vitals:   07/17/21 1120 07/17/21 1212  BP: (!) 173/76 (!) 126/55  Pulse: 88 80  Resp: 20 18  Temp: 37.3 C 37.2 C  SpO2: 99% 96%    Last Pain:  Vitals:   07/17/21 1212  TempSrc: Oral  PainSc: [name]

## 2021-07-20 ENCOUNTER — Encounter: Payer: Self-pay | Admitting: Internal Medicine

## 2021-07-21 LAB — SURGICAL PATHOLOGY

## 2021-07-23 ENCOUNTER — Encounter (HOSPITAL_COMMUNITY): Payer: Self-pay | Admitting: Internal Medicine

## 2021-08-03 ENCOUNTER — Ambulatory Visit (INDEPENDENT_AMBULATORY_CARE_PROVIDER_SITE_OTHER): Payer: Medicaid Other | Admitting: Internal Medicine

## 2021-08-03 ENCOUNTER — Encounter: Payer: Self-pay | Admitting: Internal Medicine

## 2021-08-03 VITALS — BP 150/70 | HR 76 | Ht 61.0 in | Wt 99.0 lb

## 2021-08-03 DIAGNOSIS — I10 Essential (primary) hypertension: Secondary | ICD-10-CM

## 2021-08-03 DIAGNOSIS — Z89612 Acquired absence of left leg above knee: Secondary | ICD-10-CM | POA: Diagnosis not present

## 2021-08-03 DIAGNOSIS — N184 Chronic kidney disease, stage 4 (severe): Secondary | ICD-10-CM

## 2021-08-03 DIAGNOSIS — I255 Ischemic cardiomyopathy: Secondary | ICD-10-CM

## 2021-08-03 DIAGNOSIS — I739 Peripheral vascular disease, unspecified: Secondary | ICD-10-CM

## 2021-08-03 DIAGNOSIS — E1059 Type 1 diabetes mellitus with other circulatory complications: Secondary | ICD-10-CM | POA: Diagnosis not present

## 2021-08-03 DIAGNOSIS — F411 Generalized anxiety disorder: Secondary | ICD-10-CM | POA: Insufficient documentation

## 2021-08-03 MED ORDER — MIRTAZAPINE 15 MG PO TBDP: 15.0000 mg | ORAL_TABLET | Freq: Every day | ORAL | 2 refills | Status: DC

## 2021-08-03 NOTE — Assessment & Plan Note (Signed)
Related to uncontrolled type 2 DM Progressive CKD Referred to Nephrology

## 2021-08-03 NOTE — Progress Notes (Addendum)
Established Patient Office Visit  Subjective:  Patient ID: [name], female    DOB: [birth year]  Age: 51 y.o. MRN: [id number]  CC:  Chief Complaint  Patient presents with   Follow-up   Pain    On bottom hasn't fell, no scratches, or anything on it.    Anxiety    HPI [name] is a 51 y.o. 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  Her daughter was present during the visit.  She also has progressive CKD, last CMP reviewed.  She denies any dysuria, hematuria or urinary hesitancy.  She follows up with Dr. Theador Hawthorne.  She complains of chronic low back pain.  She has difficulty ambulating, and is mostly wheelchair-bound or bedridden.  Her daughter helps her with ADLs.  Does not have any sacral or pressure ulcer currently.  She is on oxycodone for chronic pain.  She also has radiating symptoms to RLE, but did not like gabapentin or amitriptyline for neuropathy.  She also complains of severe spells of anxiety and insomnia.  She also reports anhedonia, fatigue and lack of concentration.  Denies SI or HI.  She also reports loss of weight, but her daughter reports that she has very poor p.o. intake.   Type I DM: Her last HbA1C was 11.6.  She has been taking Lantus 15 units at bedtime and follows insulin sliding scale.  Her daughter checks her blood sugars before meals and at bedtime, which have been variable.  She has been following up with endocrinology for it.     Past Medical History:  Diagnosis Date   Acquired absence of left leg below knee ([place])    Anxiety    was on xanax for years but discontinued prior to prior PCP retiring as she was on opioids and was told she would not find someone to write for both   Arterial thromboembolism ([place])     Prior embolectomy, previously on Coumadin   Cataract    Phreesia 11/19/2019   Chronic kidney disease    Phreesia 11/19/2019   Clotting disorder ([place])    Phreesia 11/19/2019   Coronary atherosclerosis of native coronary artery    a. s/p DES to LAD in 2014   Depression    Depression    Phreesia 11/19/2019   Diabetes mellitus without complication ([place])    Phreesia 11/19/2019   Diabetic ketoacidosis with coma associated with type 1 diabetes mellitus ([place])    DKA, type 1 ([place]) 11/17/2016   Facial cellulitis    12/2010   Glomerulonephritis    Headache(784.0)    Hemiplegia, unspecified affecting right dominant side ([place])    History of stroke    Hyperlipidemia    Insulin dependent diabetes mellitus    History of diabetic ketoacidosis   Ischemic cardiomyopathy    a. EF 30-35% in 2018 b. EF at 45-50% by repeat imaging in 02/2019   Left carotid artery occlusion    Major depressive disorder, single episode, unspecified    Pancreatitis, acute 11/07/2016   Peripheral arterial disease ([place])    Shingles    11/2010   ST elevation myocardial infarction (STEMI) of anterior wall [facility])    Late presentation September 2014   Stroke [facility])    2018   Upper abdominal pain  06/03/2021    Past Surgical History:  Procedure Laterality Date   AMPUTATION  03/03/2011   Procedure: AMPUTATION DIGIT;  Surgeon: Newt Minion, MD;  Location: [place];  Service: Orthopedics;  Laterality: Left;  Left Foot Amputation 4th and 5th toes at MTP joint   AMPUTATION  04/22/2011   Procedure: AMPUTATION BELOW KNEE;  Surgeon: Newt Minion, MD;  Location: [facility];  Service: Orthopedics;  Laterality: Left;  Left Below Knee Amputation   BIOPSY  07/17/2021   Procedure: BIOPSY;  Surgeon: Eloise Harman, DO;  [facility];  Service: Endoscopy;;   CATARACT EXTRACTION W/PHACO Left 01/09/2020   Procedure: CATARACT EXTRACTION PHACO AND INTRAOCULAR LENS PLACEMENT (IOC);  Surgeon: Baruch Goldmann, MD;  Location: [facility];   Service: Ophthalmology;  Laterality: Left;  CDE: 9.73   COLONOSCOPY WITH PROPOFOL N/A 12/14/2021   Procedure: COLONOSCOPY WITH PROPOFOL;  Surgeon: Eloise Harman, DO;  [facility];  Service: Endoscopy;  Laterality: N/A;  9:45 am   DILATION AND CURETTAGE OF UTERUS     EMBOLECTOMY  01/29/2011   Procedure: EMBOLECTOMY;  Surgeon: Mal Misty, MD;  Location: [facility] OR;  Service: Vascular;  Laterality: Left;  Left Popliteal and Tibial Embolectomy with patch angioplasty   ESOPHAGOGASTRODUODENOSCOPY (EGD) WITH PROPOFOL N/A 07/17/2021   Procedure: ESOPHAGOGASTRODUODENOSCOPY (EGD) WITH PROPOFOL;  Surgeon: Eloise Harman, DO;  [facility];  Service: Endoscopy;  Laterality: N/A;  1:00pm   INCISION AND DRAINAGE ABSCESS N/A 04/06/2016   Procedure: INCISION AND DRAINAGE ABSCESS;  Surgeon: Jonnie Kind, MD;  Location: [facility];  Service: Gynecology;  Laterality: N/A;   LEFT HEART CATHETERIZATION WITH CORONARY ANGIOGRAM N/A 10/01/2012   Procedure: LEFT HEART CATHETERIZATION WITH CORONARY ANGIOGRAM;  Surgeon: Peter M Martinique, MD;  Location: [facility] CATH LAB;  Service: Cardiovascular;  Laterality: N/A;   LOWER EXTREMITY ANGIOGRAM N/A 01/27/2011   Procedure: LOWER EXTREMITY ANGIOGRAM;  Surgeon: Rosetta Posner, MD;  Location: [facility] CATH LAB;  Service: Cardiovascular;  Laterality: N/A;   LOWER EXTREMITY ANGIOGRAM N/A 01/28/2011   Procedure: LOWER EXTREMITY ANGIOGRAM;  Surgeon: Conrad Lomita, MD;  Location: [facility] CATH LAB;  Service: Cardiovascular;  Laterality: N/A;   LOWER EXTREMITY ANGIOGRAM Left 01/29/2011   Procedure: LOWER EXTREMITY ANGIOGRAM;  Surgeon: Mal Misty, MD;  Location: [facility] CATH LAB;  Service: Cardiovascular;  Laterality: Left;   MULTIPLE TOOTH EXTRACTIONS     POLYPECTOMY  12/14/2021   Procedure: POLYPECTOMY;  Surgeon: Eloise Harman, DO;  [facility];  Service: Endoscopy;;   TEE WITHOUT CARDIOVERSION  04/15/2011   Procedure: TRANSESOPHAGEAL ECHOCARDIOGRAM (TEE);  Surgeon:  [name], MD;  [facility];  Service: Cardiovascular;  Laterality: N/A;   WRIST SURGERY     Left    Family History  Problem Relation Age of Onset   COPD Mother    Hypertension Mother    Diabetes Mother    Stroke Mother    Coronary artery disease Mother    Diabetes Father    Hypertension Father    Coronary artery disease Father    Kidney disease Father        on dialysis, died from heart attack while being dialzed   Pancreatitis Maternal Aunt    Cancer Maternal Aunt        unknown primary   Anesthesia problems Neg Hx    Colon cancer Neg Hx     Social History   Socioeconomic History   Marital status:  Single    Spouse name: Not on file   Number of children: Not on file   Years of education: Not on file   Highest education level: Not on file  Occupational History   Not on file  Tobacco Use   Smoking status: Every Day    Packs/day: 0.50    Years: 21.00    Total pack years: 10.50    Types: Cigarettes    Start date: 07/06/1989   Smokeless tobacco: Never  Vaping Use   Vaping Use: Never used  Substance and Sexual Activity   Alcohol use: No    Alcohol/week: 0.0 standard drinks of alcohol    Comment: rarely   Drug use: Not Currently    Frequency: 1.0 times per week    Types: Marijuana   Sexual activity: Yes    Partners: Male    Comment: pt stated that her tubal was only 50% effective- "not cut and burned"  Other Topics Concern   Not on file  Social History Narrative   Unemployed.  Lives in [place] with [place], [place], and mother.  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   Social Determinants of Health   Financial Resource Strain: Not on file  Food Insecurity: Not on file  Transportation Needs: Not on file  Physical Activity: Not on file  Stress: Not on file  Social Connections: Not on file  Intimate Partner Violence: Not on file    Outpatient Medications Prior to Visit  Medication Sig Dispense Refill   apixaban (ELIQUIS) 2.5 MG  TABS tablet Take 1 tablet (2.5 mg total) by mouth 2 (two) times daily. 180 tablet 3   atorvastatin (LIPITOR) 80 MG tablet Take 1 tablet (80 mg total) by mouth daily. (Patient taking differently: Take 40 mg by mouth 2 (two) times daily.) 90 tablet 3   carvedilol (COREG) 6.25 MG tablet Take 1 tablet (6.25 mg total) by mouth 2 (two) times daily with a meal. 180 tablet 3   hydrALAZINE (APRESOLINE) 25 MG tablet Take 0.5 tablets (12.5 mg total) by mouth 2 (two) times daily. 90 tablet 3   Insulin Pen Needle (UNIFINE PENTIPS) 31G X 8 MM MISC USE TO INJECT INSULIN SUBCUTANEOUSLY . USE FOR BOTH SLIDING SCALE AND FOR DAILYINSULIN DOSES. 100 each 11   nitroGLYCERIN (NITROSTAT) 0.4 MG SL tablet Place 1 tablet (0.4 mg total) under the tongue every 5 (five) minutes as needed for chest pain. 25 tablet 3   omeprazole (PRILOSEC) 20 MG capsule Take 1 capsule (20 mg total) by mouth daily. 30 capsule 3   oxyCODONE-acetaminophen (PERCOCET) 10-325 MG tablet Take 0.5 tablets every 4 (four) hours as needed by mouth for pain. (Patient taking differently: Take 1 tablet by mouth every 4 (four) hours as needed for pain.) 120 tablet 0   glucose blood (ACCU-CHEK GUIDE) test strip Use as instructed to monitor glucose 4 times daily, before meals and before bed. 400 each 12   insulin aspart (NOVOLOG) 100 UNIT/ML FlexPen Inject 2-8 Units into the skin 3 (three) times daily with meals. (Patient taking differently: Inject 1-7 Units into the skin 3 (three) times daily with meals.) 9 mL 0   LANTUS SOLOSTAR 100 UNIT/ML Solostar Pen Inject 15 Units into the skin at bedtime.     LINZESS 72 MCG capsule Take 72 mcg by mouth daily.     ondansetron (ZOFRAN) 4 MG tablet Take 23m po every 4-6 hours as needed for n/v. 30 tablet 1   amitriptyline (ELAVIL) 25 MG tablet  Take 25 mg by mouth 2 (two) times a week. (Patient not taking: Reported on 08/03/2021)     oxymetazoline (AFRIN) 0.05 % nasal spray Place 1 spray into both nostrils daily as needed for  congestion.     No facility-administered medications prior to visit.    Allergies  Allergen Reactions   Losartan Swelling   Mirtazapine     Dizziness/confusion    Nsaids     Kidney disease    ROS Review of Systems  Constitutional:  Negative for chills and fever.  HENT:  Negative for congestion, sinus pressure, sinus pain and sore throat.   Eyes:  Positive for visual disturbance. Negative for pain and discharge.  Respiratory:  Negative for cough and shortness of breath.   Cardiovascular:  Negative for chest pain and palpitations.  Gastrointestinal:  Negative for abdominal pain, nausea and vomiting.  Endocrine: Negative for polydipsia and polyuria.  Genitourinary:  Negative for dysuria and hematuria.  Musculoskeletal:  Positive for arthralgias and back pain. Negative for neck pain and neck stiffness.  Skin:  Negative for rash.  Neurological:  Negative for dizziness and weakness.  Psychiatric/Behavioral:  Negative for agitation and behavioral problems. The patient is nervous/anxious.       Objective:    Physical Exam Vitals reviewed.  Constitutional:      General: She is not in acute distress.    Appearance: She is not diaphoretic.     Comments: In wheelchair  HENT:     Head: Normocephalic and atraumatic.     Nose: Nose normal.     Mouth/Throat:     Mouth: Mucous membranes are moist.  Eyes:     General: No scleral icterus.    Extraocular Movements: Extraocular movements intact.  Cardiovascular:     Rate and Rhythm: Normal rate and regular rhythm.     Pulses: Normal pulses.     Heart sounds: Normal heart sounds. No murmur heard. Pulmonary:     Breath sounds: Normal breath sounds. No wheezing or rales.  Musculoskeletal:     Cervical back: Neck supple. No tenderness.     Comments: S/p left BKA, prosthesis in place  Skin:    General: Skin is warm.  Neurological:     General: No focal deficit present.     Mental Status: She is alert and oriented to person, place,  and time.     Sensory: Sensory deficit present.     Motor: Weakness (Right LE) present.  Psychiatric:        Mood and Affect: Mood normal.        Behavior: Behavior normal.     BP (!) 150/70   Pulse 76   Ht '5\' 1"'  (1.549 m)   Wt 99 lb (44.9 kg)   LMP 12/28/2010   SpO2 98%   BMI 18.71 kg/m  Wt Readings from Last 3 Encounters:  12/31/21 103 lb (46.7 kg)  12/23/21 104 lb (47.2 kg)  12/14/21 101 lb 6.6 oz (46 kg)    Lab Results  Component Value Date   TSH 3.070 04/09/2021   Lab Results  Component Value Date   WBC 7.6 12/03/2021   HGB 12.0 12/03/2021   HCT 37.1 12/03/2021   MCV 94.4 12/03/2021   PLT 240 12/03/2021   Lab Results  Component Value Date   NA 137 12/08/2021   K 5.4 (H) 12/08/2021   CO2 27 12/08/2021   GLUCOSE 172 (H) 12/08/2021   BUN 38 (H) 12/08/2021   CREATININE 2.70 (H) 12/08/2021  BILITOT 0.3 05/01/2021   ALKPHOS 100 05/01/2021   AST 14 05/01/2021   ALT 19 05/01/2021   PROT 6.1 05/01/2021   ALBUMIN 3.6 (L) 05/01/2021   CALCIUM 8.2 (L) 12/08/2021   ANIONGAP 4 (L) 12/08/2021   EGFR 26 (L) 04/09/2021   Lab Results  Component Value Date   CHOL 151 04/09/2021   Lab Results  Component Value Date   HDL 59 04/09/2021   Lab Results  Component Value Date   LDLCALC 77 04/09/2021   Lab Results  Component Value Date   TRIG 75 04/09/2021   Lab Results  Component Value Date   CHOLHDL 2.6 04/09/2021   Lab Results  Component Value Date   HGBA1C 10.5 (A) 12/23/2021      Assessment & Plan:   Problem List Items Addressed This Visit       Cardiovascular and Mediastinum   PAD (peripheral artery disease) ([place])    Related to type 1 DM S/p left AKA On Eliquis due to coagulopathy      Cardiomyopathy ([place])    Ischemic On Coreg 6.25 mg BID Follows up with Cardiologist      Essential hypertension    BP Readings from Last 1 Encounters:  08/03/21 (!) 150/70  Elevated today as she has not had hydralazine today Usually well-controlled at  home Counseled for compliance with the medications Advised DASH diet         Endocrine   Diabetes mellitus type I ([place])    Lab Results  Component Value Date   HGBA1C 11.6 (A) 04/13/2021  On Lantus 15 U qHS and ISS, followed by Endocrinology Advised to follow diabetic diet On statin Diabetic eye exam: In 10/2019, no retinopathy        Genitourinary   Stage 4 chronic kidney disease ([place])    Related to uncontrolled type 2 DM Progressive CKD Referred to Nephrology        Other   Acquired absence of left leg above knee ([place])    Due to PAD      GAD (generalized anxiety disorder) - Primary    Considering her weight loss due to lack of appetite, started Remeron Her chronic medical conditions are also contributing to her depression and anxiety Did not tolerate Elavil       Meds ordered this encounter  Medications   DISCONTD: mirtazapine (REMERON SOL-TAB) 15 MG disintegrating tablet    Sig: Take 1 tablet (15 mg total) by mouth at bedtime.    Dispense:  30 tablet    Refill:  2    Follow-up: Return in about 4 months (around 12/04/2021) for Annual physical.    Lindell Spar, MD

## 2021-08-03 NOTE — Assessment & Plan Note (Signed)
Considering her weight loss due to lack of appetite, started Remeron Her chronic medical conditions are also contributing to her depression and anxiety Did not tolerate Elavil

## 2021-08-03 NOTE — Patient Instructions (Signed)
Please start taking Remeron as prescribed.  Please continue taking other medications as prescribed.  Please help her with frequent repositioning to avoid sacral ulcer.

## 2021-08-03 NOTE — Assessment & Plan Note (Signed)
Ischemic On Coreg 6.25 mg BID Follows up with Cardiologist

## 2021-08-03 NOTE — Assessment & Plan Note (Signed)
Due to PAD

## 2021-08-03 NOTE — Assessment & Plan Note (Signed)
Related to type 1 DM S/p left AKA On Eliquis due to coagulopathy

## 2021-08-03 NOTE — Assessment & Plan Note (Signed)
BP Readings from Last 1 Encounters:  08/03/21 (!) 150/70   Elevated today as she has not had hydralazine today Usually well-controlled at home Counseled for compliance with the medications Advised DASH diet

## 2021-08-03 NOTE — Assessment & Plan Note (Signed)
Lab Results  Component Value Date   HGBA1C 11.6 (A) 04/13/2021   On Lantus 15 U qHS and ISS, followed by Endocrinology Advised to follow diabetic diet On statin Diabetic eye exam: In 10/2019, no retinopathy

## 2021-08-04 ENCOUNTER — Telehealth: Payer: Self-pay | Admitting: Internal Medicine

## 2021-08-04 ENCOUNTER — Other Ambulatory Visit: Payer: Self-pay | Admitting: Nurse Practitioner

## 2021-08-04 ENCOUNTER — Other Ambulatory Visit: Payer: Self-pay | Admitting: Internal Medicine

## 2021-08-04 DIAGNOSIS — F411 Generalized anxiety disorder: Secondary | ICD-10-CM

## 2021-08-04 MED ORDER — MIRTAZAPINE 15 MG PO TABS: 15.0000 mg | ORAL_TABLET | Freq: Every day | ORAL | 5 refills | Status: DC

## 2021-08-04 NOTE — Telephone Encounter (Signed)
Tammy with [facility] phar call states the dissolvable tablets. She spoke with pt daughter and she thinks she will be able to swallow the tables because of how small they are. Wants to know if you can please change rx to swallow tablets? Pt daughter states she doesn't want to use another phar to get this.    mirtazapine (REMERON SOL-TAB) 15 MG disintegrating tablet

## 2021-08-12 ENCOUNTER — Ambulatory Visit: Payer: Medicaid Other | Admitting: Nurse Practitioner

## 2021-08-12 ENCOUNTER — Encounter: Payer: Self-pay | Admitting: Nurse Practitioner

## 2021-08-24 ENCOUNTER — Other Ambulatory Visit (HOSPITAL_COMMUNITY): Payer: Self-pay | Admitting: Nephrology

## 2021-08-24 ENCOUNTER — Other Ambulatory Visit: Payer: Self-pay | Admitting: Nephrology

## 2021-08-24 DIAGNOSIS — E1122 Type 2 diabetes mellitus with diabetic chronic kidney disease: Secondary | ICD-10-CM

## 2021-08-24 DIAGNOSIS — N170 Acute kidney failure with tubular necrosis: Secondary | ICD-10-CM

## 2021-08-24 DIAGNOSIS — R809 Proteinuria, unspecified: Secondary | ICD-10-CM

## 2021-09-01 ENCOUNTER — Other Ambulatory Visit: Payer: Self-pay | Admitting: Gastroenterology

## 2021-09-01 ENCOUNTER — Ambulatory Visit (HOSPITAL_COMMUNITY)
Admission: RE | Admit: 2021-09-01 | Discharge: 2021-09-01 | Disposition: A | Discharge status: Home / Self Care | Payer: Medicaid Other | Source: Ambulatory Visit | Attending: Nephrology | Admitting: Nephrology

## 2021-09-01 DIAGNOSIS — E1122 Type 2 diabetes mellitus with diabetic chronic kidney disease: Secondary | ICD-10-CM | POA: Insufficient documentation

## 2021-09-01 DIAGNOSIS — N170 Acute kidney failure with tubular necrosis: Secondary | ICD-10-CM | POA: Insufficient documentation

## 2021-09-01 DIAGNOSIS — R809 Proteinuria, unspecified: Secondary | ICD-10-CM | POA: Insufficient documentation

## 2021-09-09 ENCOUNTER — Ambulatory Visit: Payer: Medicaid Other | Admitting: Family Medicine

## 2021-09-16 ENCOUNTER — Other Ambulatory Visit (HOSPITAL_COMMUNITY): Payer: Self-pay | Admitting: Nephrology

## 2021-09-16 ENCOUNTER — Other Ambulatory Visit: Payer: Self-pay | Admitting: Nephrology

## 2021-09-16 DIAGNOSIS — E1122 Type 2 diabetes mellitus with diabetic chronic kidney disease: Secondary | ICD-10-CM

## 2021-09-16 DIAGNOSIS — R809 Proteinuria, unspecified: Secondary | ICD-10-CM

## 2021-09-16 DIAGNOSIS — N170 Acute kidney failure with tubular necrosis: Secondary | ICD-10-CM

## 2021-09-16 DIAGNOSIS — I5042 Chronic combined systolic (congestive) and diastolic (congestive) heart failure: Secondary | ICD-10-CM

## 2021-09-16 NOTE — Progress Notes (Unsigned)
Ahmed Prima, [place] M, PA-C  Donita Brooks D Good morning,   She has not been on this from a cardiac perspective. By review of notes, she is on it due to positive Lupus Anticoagulant/Protein C and Protein S Deficiency. Would defer this to Hematology or her PCP.   Thanks,  [place]

## 2021-09-18 ENCOUNTER — Other Ambulatory Visit (HOSPITAL_COMMUNITY)
Admission: RE | Admit: 2021-09-18 | Discharge: 2021-09-18 | Disposition: A | Discharge status: Home / Self Care | Payer: Medicaid Other | Source: Ambulatory Visit | Attending: Family Medicine | Admitting: Family Medicine

## 2021-09-18 ENCOUNTER — Encounter: Payer: Self-pay | Admitting: Family Medicine

## 2021-09-18 ENCOUNTER — Other Ambulatory Visit: Payer: Self-pay

## 2021-09-18 ENCOUNTER — Ambulatory Visit (INDEPENDENT_AMBULATORY_CARE_PROVIDER_SITE_OTHER): Payer: Medicaid Other | Admitting: Family Medicine

## 2021-09-18 VITALS — BP 144/74 | HR 75 | Ht 61.0 in | Wt 103.0 lb

## 2021-09-18 DIAGNOSIS — Z124 Encounter for screening for malignant neoplasm of cervix: Secondary | ICD-10-CM | POA: Insufficient documentation

## 2021-09-18 DIAGNOSIS — N3090 Cystitis, unspecified without hematuria: Secondary | ICD-10-CM

## 2021-09-18 DIAGNOSIS — E1059 Type 1 diabetes mellitus with other circulatory complications: Secondary | ICD-10-CM

## 2021-09-18 DIAGNOSIS — R300 Dysuria: Secondary | ICD-10-CM | POA: Diagnosis not present

## 2021-09-18 DIAGNOSIS — N184 Chronic kidney disease, stage 4 (severe): Secondary | ICD-10-CM

## 2021-09-18 LAB — POCT URINALYSIS DIP (CLINITEK)
Bilirubin, UA: NEGATIVE
Glucose, UA: 250 mg/dL — AB
Ketones, POC UA: NEGATIVE mg/dL
Leukocytes, UA: NEGATIVE
Nitrite, UA: POSITIVE — AB
POC PROTEIN,UA: >=300 — AB
Spec Grav, UA: 1.025 (ref 1.010–1.025)
Urobilinogen, UA: 1 E.U./dL
pH, UA: 7 (ref 5.0–8.0)

## 2021-09-18 MED ORDER — NITROFURANTOIN MONOHYD MACRO 100 MG PO CAPS: 100.0000 mg | ORAL_CAPSULE | Freq: Two times a day (BID) | ORAL | 0 refills | Status: AC

## 2021-09-18 NOTE — Progress Notes (Unsigned)
Established Patient Office Visit  Subjective:  Patient ID: [name], female    DOB: 1970/10/13  Age: 51 y.o. MRN: [id number]  CC:  Chief Complaint  Patient presents with   Gynecologic Exam    pap   burning with urination     Since 09/16/21    HPI [name] is a 51 y.o. female with past medical history of PAD, essential hypertension, presents for a pap smear with complaints of urinary symptoms.  Past Medical History:  Diagnosis Date   Acquired absence of left leg below knee ([place])    Anxiety    was on xanax for years but discontinued prior to prior PCP retiring as she was on opioids and was told she would not find someone to write for both   Arterial thromboembolism ([place])    Prior embolectomy, previously on Coumadin   Cataract    Phreesia 11/19/2019   Chronic kidney disease    Phreesia 11/19/2019   Clotting disorder ([place])    Phreesia 11/19/2019   Coronary atherosclerosis of native coronary artery    a. s/p DES to LAD in 2014   Depression    Depression    Phreesia 11/19/2019   Diabetes mellitus without complication ([place])    Phreesia 11/19/2019   Diabetic ketoacidosis with coma associated with type 1 diabetes mellitus ([place])    DKA, type 1 ([place]) 11/17/2016   Facial cellulitis    12/2010   Glomerulonephritis    Headache(784.0)    Hemiplegia, unspecified affecting right dominant side ([place])    History of stroke    Hyperlipidemia    Insulin dependent diabetes mellitus    History of diabetic ketoacidosis   Ischemic cardiomyopathy    a. EF 30-35% in 2018 b. EF at 45-50% by repeat imaging in 02/2019   Left carotid artery occlusion    Major depressive disorder, single episode, unspecified    Pancreatitis, acute 11/07/2016   Peripheral arterial disease ([place])    Shingles    11/2010   ST elevation myocardial infarction (STEMI) of anterior wall [facility])    Late presentation September 2014   Stroke [facility])    2018   Upper abdominal pain 06/03/2021    Past Surgical  History:  Procedure Laterality Date   AMPUTATION  03/03/2011   Procedure: AMPUTATION DIGIT;  Surgeon: Newt Minion, MD;  Location: [place];  Service: Orthopedics;  Laterality: Left;  Left Foot Amputation 4th and 5th toes at MTP joint   AMPUTATION  04/22/2011   Procedure: AMPUTATION BELOW KNEE;  Surgeon: Newt Minion, MD;  Location: [place];  Service: Orthopedics;  Laterality: Left;  Left Below Knee Amputation   BIOPSY  07/17/2021   Procedure: BIOPSY;  Surgeon: Eloise Harman, DO;  [facility];  Service: Endoscopy;;   CATARACT EXTRACTION W/PHACO Left 01/09/2020   Procedure: CATARACT EXTRACTION PHACO AND INTRAOCULAR LENS PLACEMENT (IOC);  Surgeon: Baruch Goldmann, MD;  Location[facility];  Service: Ophthalmology;  Laterality: Left;  CDE: 9.73   DILATION AND CURETTAGE OF UTERUS     EMBOLECTOMY  01/29/2011   Procedure: EMBOLECTOMY;  Surgeon: Mal Misty, MD;  Location: [facility] OR;  Service: Vascular;  Laterality: Left;  Left Popliteal and Tibial Embolectomy with patch angioplasty   ESOPHAGOGASTRODUODENOSCOPY (EGD) WITH PROPOFOL N/A 07/17/2021   Procedure: ESOPHAGOGASTRODUODENOSCOPY (EGD) WITH PROPOFOL;  Surgeon: Eloise Harman, DO;  [facility];  Service: Endoscopy;  Laterality: N/A;  1:00pm   INCISION AND DRAINAGE ABSCESS N/A  04/06/2016   Procedure: INCISION AND DRAINAGE ABSCESS;  Surgeon: Jonnie Kind, MD;  [facility];  Service: Gynecology;  Laterality: N/A;   LEFT HEART CATHETERIZATION WITH CORONARY ANGIOGRAM N/A 10/01/2012   Procedure: LEFT HEART CATHETERIZATION WITH CORONARY ANGIOGRAM;  Surgeon: Peter M Martinique, MD;  Location: [facility] CATH LAB;  Service: Cardiovascular;  Laterality: N/A;   LOWER EXTREMITY ANGIOGRAM N/A 01/27/2011   Procedure: LOWER EXTREMITY ANGIOGRAM;  Surgeon: Rosetta Posner, MD;  Location: [facility] CATH LAB;  Service: Cardiovascular;  Laterality: N/A;   LOWER EXTREMITY ANGIOGRAM N/A 01/28/2011   Procedure: LOWER EXTREMITY ANGIOGRAM;  Surgeon: Conrad Eva, MD;   Location: [facility] CATH LAB;  Service: Cardiovascular;  Laterality: N/A;   LOWER EXTREMITY ANGIOGRAM Left 01/29/2011   Procedure: LOWER EXTREMITY ANGIOGRAM;  Surgeon: Mal Misty, MD;  Location: [facility] CATH LAB;  Service: Cardiovascular;  Laterality: Left;   MULTIPLE TOOTH EXTRACTIONS     TEE WITHOUT CARDIOVERSION  04/15/2011   Procedure: TRANSESOPHAGEAL ECHOCARDIOGRAM (TEE);  Surgeon: [name], MD;  [facility];  Service: Cardiovascular;  Laterality: N/A;   WRIST SURGERY     Left    Family History  Problem Relation Age of Onset   COPD Mother    Hypertension Mother    Diabetes Mother    Stroke Mother    Coronary artery disease Mother    Diabetes Father    Hypertension Father    Coronary artery disease Father    Kidney disease Father        on dialysis, died from heart attack while being dialzed   Pancreatitis Maternal Aunt    Cancer Maternal Aunt        unknown primary   Anesthesia problems Neg Hx    Colon cancer Neg Hx     Social History   Socioeconomic History   Marital status: Single    Spouse name: Not on file   Number of children: Not on file   Years of education: Not on file   Highest education level: Not on file  Occupational History   Not on file  Tobacco Use   Smoking status: Every Day    Packs/day: 0.50    Years: 21.00    Total pack years: 10.50    Types: Cigarettes    Start date: 07/06/1989   Smokeless tobacco: Never  Vaping Use   Vaping Use: Never used  Substance and Sexual Activity   Alcohol use: No    Alcohol/week: 0.0 standard drinks of alcohol    Comment: rarely   Drug use: Yes    Frequency: 1.0 times per week    Types: Marijuana   Sexual activity: Yes    Partners: Male    Comment: pt stated that her tubal was only 50% effective- "not cut and burned"  Other Topics Concern   Not on file  Social History Narrative   Unemployed.  Lives in [place] with [place], [place], and mother.  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    Social Determinants of Health   Financial Resource Strain: Not on file  Food Insecurity: Not on file  Transportation Needs: Not on file  Physical Activity: Not on file  Stress: Not on file  Social Connections: Not on file  Intimate Partner Violence: Not on file    Outpatient Medications Prior to Visit  Medication Sig Dispense Refill   apixaban (ELIQUIS) 2.5 MG TABS tablet Take 1 tablet (2.5 mg total) by mouth 2 (two) times daily. [place]  tablet 3   atorvastatin (LIPITOR) 80 MG tablet Take 1 tablet (80 mg total) by mouth daily. 90 tablet 3   carvedilol (COREG) 6.25 MG tablet Take 1 tablet (6.25 mg total) by mouth 2 (two) times daily with a meal. 180 tablet 3   glucose blood (ACCU-CHEK GUIDE) test strip Use as instructed to monitor glucose 4 times daily, before meals and before bed. 400 each 12   hydrALAZINE (APRESOLINE) 25 MG tablet Take 0.5 tablets (12.5 mg total) by mouth 2 (two) times daily. 90 tablet 3   insulin aspart (NOVOLOG) 100 UNIT/ML FlexPen Inject 2-8 Units into the skin 3 (three) times daily with meals. 9 mL 0   Insulin Pen Needle (UNIFINE PENTIPS) 31G X 8 MM MISC USE TO INJECT INSULIN SUBCUTANEOUSLY . USE FOR BOTH SLIDING SCALE AND FOR DAILYINSULIN DOSES. 100 each 11   LANTUS SOLOSTAR 100 UNIT/ML Solostar Pen Inject 15 Units into the skin at bedtime.     LINZESS 72 MCG capsule Take 72 mcg by mouth daily.     nitroGLYCERIN (NITROSTAT) 0.4 MG SL tablet Place 1 tablet (0.4 mg total) under the tongue every 5 (five) minutes as needed for chest pain. 25 tablet 3   omeprazole (PRILOSEC) 20 MG capsule Take 1 capsule (20 mg total) by mouth daily. 30 capsule 3   ondansetron (ZOFRAN) 4 MG tablet TAKE ONE TABLET (4MG) BY MOUTH EVERY 4 TO 6 HOURS AS NEEDED FOR NAUSEA/VOMITING 30 tablet 0   oxyCODONE-acetaminophen (PERCOCET) 10-325 MG tablet Take 0.5 tablets every 4 (four) hours as needed by mouth for pain. 120 tablet 0   mirtazapine (REMERON) 15 MG tablet Take 1 tablet (15 mg total) by  mouth at bedtime. (Patient not taking: Reported on 09/18/2021) 30 tablet 5   No facility-administered medications prior to visit.    Allergies  Allergen Reactions   Ibuprofen Other (See Comments)    Kidney disease   Losartan Swelling    ROS Review of Systems  Constitutional:  Negative for fever.  Respiratory:  Negative for cough and chest tightness.   Genitourinary:  Positive for frequency and urgency. Negative for dysuria, flank pain, hematuria, vaginal bleeding, vaginal discharge and vaginal pain.      Objective:    Physical Exam HENT:     Head: Normocephalic.  Cardiovascular:     Rate and Rhythm: Normal rate and regular rhythm.     Pulses: Normal pulses.     Heart sounds: Normal heart sounds.  Pulmonary:     Effort: Pulmonary effort is normal.     Breath sounds: Normal breath sounds.  Abdominal:     Tenderness: There is no right CVA tenderness or left CVA tenderness.  Genitourinary:    General: Normal vulva.     Exam position: Lithotomy position.     Pubic Area: No rash or pubic lice.      Tanner stage (genital): 5.     Comments: Vaginal wall: pink and rugated, smooth and non-tender; absence of lesions, edema, and erythema. Labia Majora and Minora: present bilaterally, moist, soft tissue, and homogeneous; free of edema and ulcerations. Clitoris is anatomically present, above the urethral, and free of lesions, masses, and ulceration.   Neurological:     Mental Status: She is alert.     BP (!) 144/74 (BP Location: Left Arm, Cuff Size: Normal)   Pulse 75   Ht '5\' 1"'  (1.549 m)   Wt 103 lb (46.7 kg)   LMP 12/28/2010   SpO2 96%   BMI  19.46 kg/m  Wt Readings from Last 3 Encounters:  09/18/21 103 lb (46.7 kg)  08/03/21 99 lb (44.9 kg)  07/17/21 100 lb 15.5 oz (45.8 kg)    Lab Results  Component Value Date   TSH 3.070 04/09/2021   Lab Results  Component Value Date   WBC 8.7 01/07/2020   HGB 13.9 01/07/2020   HCT 43.0 01/07/2020   MCV 94.3 01/07/2020   PLT  380 01/07/2020   Lab Results  Component Value Date   NA 137 07/14/2021   K 5.1 07/14/2021   CO2 22 07/14/2021   GLUCOSE 179 (H) 07/14/2021   BUN 42 (H) 07/14/2021   CREATININE 2.82 (H) 07/14/2021   BILITOT 0.3 05/01/2021   ALKPHOS 100 05/01/2021   AST 14 05/01/2021   ALT 19 05/01/2021   PROT 6.1 05/01/2021   ALBUMIN 3.6 (L) 05/01/2021   CALCIUM 9.0 07/14/2021   ANIONGAP 14 07/14/2021   EGFR 26 (L) 04/09/2021   Lab Results  Component Value Date   CHOL 151 04/09/2021   Lab Results  Component Value Date   HDL 59 04/09/2021   Lab Results  Component Value Date   LDLCALC 77 04/09/2021   Lab Results  Component Value Date   TRIG 75 04/09/2021   Lab Results  Component Value Date   CHOLHDL 2.6 04/09/2021   Lab Results  Component Value Date   HGBA1C 11.6 (A) 04/13/2021      Assessment & Plan:   Problem List Items Addressed This Visit       Genitourinary   Cystitis    UA positive for nitrates Will treat with Macrobid for 5 days Please complete the full course of the antibiotics   You can help prevent UTIs by doing the following:  -Avoid holding urine for prolonged periods; this stretches the bladder and causes bacteria to form because bacteria like warm and wet environments to grow -Empty the bladder as soon as the need arises.  -Empty your bladder soon after intercourse.  -Take showers instead of baths -Wipe front to back; doing so after urinating and after a bowel movement helps prevent bacteria in the anal region from spreading to the vagina and urethra. -Also, drink a full glass of water to help flush bacteria.      Relevant Medications   nitrofurantoin, macrocrystal-monohydrate, (MACROBID) 100 MG capsule     Other   Cervical cancer screening - Primary    - According to the [place], cytology and HPV co-testing (preferred) every 5 years or cytology alone (acceptable) every 3 years. - Pap due 2026/2028      Other Visit Diagnoses      Encounter for Papanicolaou smear for cervical cancer screening       Relevant Orders   Cytology - PAP   Burning with urination       Relevant Orders   POCT URINALYSIS DIP (CLINITEK) (Completed)   Urine Culture       Meds ordered this encounter  Medications   nitrofurantoin, macrocrystal-monohydrate, (MACROBID) 100 MG capsule    Sig: Take 1 capsule (100 mg total) by mouth 2 (two) times daily for 5 days.    Dispense:  10 capsule    Refill:  0    Follow-up: No follow-ups on file.    Alvira Monday, FNP

## 2021-09-18 NOTE — Patient Instructions (Signed)
I appreciate the opportunity to provide care to you today!   We will call you with your pap results   Please continue to a heart-healthy diet and increase your physical activities. Try to exercise for 88mns at least three times a week.      It was a pleasure to see you and I look forward to continuing to work together on your health and well-being. Please do not hesitate to call the office if you need care or have questions about your care.   Have a wonderful day and week. With Gratitude, GAlvira MondayMSN, FNP-BC

## 2021-09-21 DIAGNOSIS — Z124 Encounter for screening for malignant neoplasm of cervix: Secondary | ICD-10-CM | POA: Insufficient documentation

## 2021-09-21 NOTE — Assessment & Plan Note (Signed)
-   According to the [place], cytology and HPV co-testing (preferred) every 5 years or cytology alone (acceptable) every 3 years. - Pap due 2026/2028

## 2021-09-21 NOTE — Assessment & Plan Note (Signed)
UA positive for nitrates Will treat with Macrobid for 5 days Please complete the full course of the antibiotics   You can help prevent UTIs by doing the following:  -Avoid holding urine for prolonged periods; this stretches the bladder and causes bacteria to form because bacteria like warm and wet environments to grow -Empty the bladder as soon as the need arises.  -Empty your bladder soon after intercourse.  -Take showers instead of baths -Wipe front to back; doing so after urinating and after a bowel movement helps prevent bacteria in the anal region from spreading to the vagina and urethra. -Also, drink a full glass of water to help flush bacteria.

## 2021-09-22 LAB — URINE CULTURE

## 2021-09-23 NOTE — Progress Notes (Unsigned)
Lindell Spar, MD  Donita Brooks D Good Morning! It is okay to hold it 2 days prior to procedure and restart it as soon as possible after procedure. It is currently prescribed and managed by Cardiology for her.

## 2021-09-24 LAB — CYTOLOGY - PAP
Comment: NEGATIVE
Diagnosis: NEGATIVE
High risk HPV: NEGATIVE

## 2021-09-24 NOTE — Progress Notes (Signed)
Please inform the patient that her pap was negative . This means that no changes were found on her cervix

## 2021-09-25 LAB — MICROALBUMIN / CREATININE URINE RATIO
Creatinine, Urine: 102.9 mg/dL
Microalb/Creat Ratio: 4447 mg/g creat — ABNORMAL HIGH (ref 0–29)
Microalbumin, Urine: 4576.4 ug/mL

## 2021-10-01 ENCOUNTER — Other Ambulatory Visit: Payer: Self-pay | Admitting: Radiology

## 2021-10-01 DIAGNOSIS — N179 Acute kidney failure, unspecified: Secondary | ICD-10-CM

## 2021-10-02 ENCOUNTER — Other Ambulatory Visit: Payer: Self-pay | Admitting: Radiology

## 2021-10-03 NOTE — H&P (Signed)
Chief Complaint: Patient was seen in consultation today for nephrotic range proteinuria at the request of [place]  Referring Physician(s): Bhutani,Manpreet S  Supervising Physician: Juliet Rude  Patient Status: [facility] - Out-pt  History of Present Illness: [name] is a 51 y.o. female with PMHs of MI, HLD, CVA in 2018, DM, PAD on Eliquis, and CKD who [facility] for random renal biopsy.  Patient has been followed by [facility] due to history of CKD. It appears that the patient had normal renal function until November 2018, she developed chronic kidney disease in July 2020 with BUN 25, creatinine 1.63.  Her renal function has been stable until June 2023 when she showed worsening of renal function with BUN of 42, creatinine 2. 82, and GFR 20.  UA has been showing proteinuria since January 2021, random renal biopsy was recommended to patient by nephrology for further evaluation and management.  Patient has been hesitant to proceed with a random renal biopsy but she ultimately she agreed to proceed.  IR was requested for image guided random renal biopsy. Patient laying in bed, not in acute distress, appears nervous. Daughter at bedside.  Reports that she was recently treated for UTI, still has dysuria but denies flank pain.  Denise headache, fever, chills, shortness of breath, cough, chest pain, abdominal pain, nausea ,vomiting, and bleeding.  Past Medical History:  Diagnosis Date   Acquired absence of left leg below knee ([place])    Anxiety    was on xanax for years but discontinued prior to prior PCP retiring as she was on opioids and was told she would not find someone to write for both   Arterial thromboembolism ([place])    Prior embolectomy, previously on Coumadin   Cataract    Phreesia 11/19/2019   Chronic kidney disease    Phreesia 11/19/2019   Clotting disorder ([place])    Phreesia 11/19/2019   Coronary atherosclerosis of native  coronary artery    a. s/p DES to LAD in 2014   Depression    Depression    Phreesia 11/19/2019   Diabetes mellitus without complication ([place])    Phreesia 11/19/2019   Diabetic ketoacidosis with coma associated with type 1 diabetes mellitus ([place])    DKA, type 1 ([place]) 11/17/2016   Facial cellulitis    12/2010   Glomerulonephritis    Headache(784.0)    Hemiplegia, unspecified affecting right dominant side ([employer])    History of stroke    Hyperlipidemia    Insulin dependent diabetes mellitus    History of diabetic ketoacidosis   Ischemic cardiomyopathy    a. EF 30-35% in 2018 b. EF at 45-50% by repeat imaging in 02/2019   Left carotid artery occlusion    Major depressive disorder, single episode, unspecified    Pancreatitis, acute 11/07/2016   Peripheral arterial disease ([place])    Shingles    11/2010   ST elevation myocardial infarction (STEMI) of anterior wall [facility])    Late presentation September 2014   Stroke [facility])    2018   Upper abdominal pain 06/03/2021    Past Surgical History:  Procedure Laterality Date   AMPUTATION  03/03/2011   Procedure: AMPUTATION DIGIT;  Surgeon: Newt Minion, MD;  Location: [place];  Service: Orthopedics;  Laterality: Left;  Left Foot Amputation 4th and 5th toes at MTP joint   AMPUTATION  04/22/2011   Procedure: AMPUTATION BELOW KNEE;  Surgeon: Newt Minion, MD;  Location: [place];  Service: Orthopedics;  Laterality: Left;  Left Below Knee Amputation   BIOPSY  07/17/2021   Procedure: BIOPSY;  Surgeon: Eloise Harman, DO;  [facility];  Service: Endoscopy;;   CATARACT EXTRACTION W/PHACO Left 01/09/2020   Procedure: CATARACT EXTRACTION PHACO AND INTRAOCULAR LENS PLACEMENT (IOC);  Surgeon: Baruch Goldmann, MD;  Location: [facility] OR[facility];  Service: Ophthalmology;  Laterality: Left;  CDE: 9.73   DILATION AND CURETTAGE OF UTERUS     EMBOLECTOMY  01/29/2011   Procedure: EMBOLECTOMY;  Surgeon: Mal Misty, MD;  Location: [facility] OR;  Service: Vascular;   Laterality: Left;  Left Popliteal and Tibial Embolectomy with patch angioplasty   ESOPHAGOGASTRODUODENOSCOPY (EGD) WITH PROPOFOL N/A 07/17/2021   Procedure: ESOPHAGOGASTRODUODENOSCOPY (EGD) WITH PROPOFOL;  Surgeon: Eloise Harman, DO;  [facility];  Service: Endoscopy;  Laterality: N/A;  1:00pm   INCISION AND DRAINAGE ABSCESS N/A 04/06/2016   Procedure: INCISION AND DRAINAGE ABSCESS;  Surgeon: Jonnie Kind, MD;  Location: [facility];  Service: Gynecology;  Laterality: N/A;   LEFT HEART CATHETERIZATION WITH CORONARY ANGIOGRAM N/A 10/01/2012   Procedure: LEFT HEART CATHETERIZATION WITH CORONARY ANGIOGRAM;  Surgeon: Peter M Martinique, MD;  Location: [facility] CATH LAB;  Service: Cardiovascular;  Laterality: N/A;   LOWER EXTREMITY ANGIOGRAM N/A 01/27/2011   Procedure: LOWER EXTREMITY ANGIOGRAM;  Surgeon: Rosetta Posner, MD;  Location: [facility] CATH LAB;  Service: Cardiovascular;  Laterality: N/A;   LOWER EXTREMITY ANGIOGRAM N/A 01/28/2011   Procedure: LOWER EXTREMITY ANGIOGRAM;  Surgeon: Conrad La Presa, MD;  Location: [facility] CATH LAB;  Service: Cardiovascular;  Laterality: N/A;   LOWER EXTREMITY ANGIOGRAM Left 01/29/2011   Procedure: LOWER EXTREMITY ANGIOGRAM;  Surgeon: Mal Misty, MD;  Location: [facility] CATH LAB;  Service: Cardiovascular;  Laterality: Left;   MULTIPLE TOOTH EXTRACTIONS     TEE WITHOUT CARDIOVERSION  04/15/2011   Procedure: TRANSESOPHAGEAL ECHOCARDIOGRAM (TEE);  Surgeon: [name], MD;  [facility];  Service: Cardiovascular;  Laterality: N/A;   WRIST SURGERY     Left    Allergies: Ibuprofen, Losartan, and Mirtazapine  Medications: Prior to Admission medications   Medication Sig Start Date End Date Taking? Authorizing Provider  acetaminophen (TYLENOL) 500 MG tablet Take 500 mg by mouth every 6 (six) hours as needed for moderate pain.   Yes [provider]  apixaban (ELIQUIS) 2.5 MG TABS tablet Take 1 tablet (2.5 mg total) by mouth 2 (two) times daily. Patient taking  differently: Take 2.5 mg by mouth at bedtime. 11/06/20  Yes Strader, [place], PA-C  atorvastatin (LIPITOR) 80 MG tablet Take 1 tablet (80 mg total) by mouth daily. 11/06/20  Yes Strader, [place] M, PA-C  calcium carbonate (TUMS EX) 750 MG chewable tablet Chew 1 tablet by mouth daily as needed for heartburn.   Yes [provider]  carvedilol (COREG) 6.25 MG tablet Take 1 tablet (6.25 mg total) by mouth 2 (two) times daily with a meal. Patient taking differently: Take 6.25 mg by mouth at bedtime. 11/06/20  Yes Strader, [place] M, PA-C  cetirizine (ZYRTEC) 10 MG tablet Take 10 mg by mouth daily as needed for allergies.   Yes [provider]  hydrALAZINE (APRESOLINE) 25 MG tablet Take 0.5 tablets (12.5 mg total) by mouth 2 (two) times daily. Patient taking differently: Take 12.5 mg by mouth at bedtime. 12/22/20  Yes Strader, [place], PA-C  insulin aspart (NOVOLOG) 100 UNIT/ML FlexPen Inject 2-8 Units into the skin 3 (three) times daily with meals. Patient  taking differently: Inject 1-7 Units into the skin 3 (three) times daily with meals. 04/08/21  Yes Brita Romp, NP  LINZESS 72 MCG capsule Take 72 mcg by mouth daily. 03/05/21  Yes [provider]  nitroGLYCERIN (NITROSTAT) 0.4 MG SL tablet Place 1 tablet (0.4 mg total) under the tongue every 5 (five) minutes as needed for chest pain. 11/06/20  Yes Strader, [place] M, PA-C  omeprazole (PRILOSEC) 20 MG capsule Take 1 capsule (20 mg total) by mouth daily. 05/27/21  Yes Alvira Monday, FNP  ondansetron (ZOFRAN) 4 MG tablet TAKE ONE TABLET ('4MG'$ ) BY MOUTH EVERY 4 TO 6 HOURS AS NEEDED FOR NAUSEA/VOMITING 09/01/21  Yes Erenest Rasher, PA-C  oxyCODONE-acetaminophen (PERCOCET) 10-325 MG tablet Take 0.5 tablets every 4 (four) hours as needed by mouth for pain. Patient taking differently: Take 0.5-1 tablets by mouth every 4 (four) hours as needed for pain. 11/24/16  Yes Regalado, Belk[place] A, MD  simethicone (GAS-X) 80 MG  chewable tablet Chew 80 mg by mouth every 6 (six) hours as needed for flatulence.   Yes [provider]  TRESIBA FLEXTOUCH 100 UNIT/ML FlexTouch Pen Inject 10 Units into the skin at bedtime. 08/04/21  Yes [provider]  glucose blood (ACCU-CHEK GUIDE) test strip Use as instructed to monitor glucose 4 times daily, before meals and before bed. 10/29/20   Brita Romp, NP  Insulin Pen Needle (UNIFINE PENTIPS) 31G X 8 MM MISC USE TO INJECT INSULIN SUBCUTANEOUSLY . USE FOR BOTH SLIDING SCALE AND FOR DAILYINSULIN DOSES. 07/24/20   Brita Romp, NP  mirtazapine (REMERON) 15 MG tablet Take 1 tablet (15 mg total) by mouth at bedtime. Patient not taking: Reported on 09/18/2021 08/04/21   Lindell Spar, MD     Family History  Problem Relation Age of Onset   COPD Mother    Hypertension Mother    Diabetes Mother    Stroke Mother    Coronary artery disease Mother    Diabetes Father    Hypertension Father    Coronary artery disease Father    Kidney disease Father        on dialysis, died from heart attack while being dialzed   Pancreatitis Maternal Aunt    Cancer Maternal Aunt        unknown primary   Anesthesia problems Neg Hx    Colon cancer Neg Hx     Social History   Socioeconomic History   Marital status: Single    Spouse name: Not on file   Number of children: Not on file   Years of education: Not on file   Highest education level: Not on file  Occupational History   Not on file  Tobacco Use   Smoking status: Every Day    Packs/day: 0.50    Years: 21.00    Total pack years: 10.50    Types: Cigarettes    Start date: 07/06/1989   Smokeless tobacco: Never  Vaping Use   Vaping Use: Never used  Substance and Sexual Activity   Alcohol use: No    Alcohol/week: 0.0 standard drinks of alcohol    Comment: rarely   Drug use: Not Currently    Frequency: 1.0 times per week    Types: Marijuana   Sexual activity: Yes    Partners: Male    Comment: pt stated  that her tubal was only 50% effective- "not cut and burned"  Other Topics Concern   Not on file  Social History Narrative  Unemployed.  Lives in [place] with [place], [place], and mother.  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   Social Determinants of Health   Financial Resource Strain: Not on file  Food Insecurity: Not on file  Transportation Needs: Not on file  Physical Activity: Not on file  Stress: Not on file  Social Connections: Not on file     Review of Systems: A 12 point ROS discussed and pertinent positives are indicated in the HPI above.  All other systems are negative.  Vital Signs: BP (!) 169/72   Pulse 89   Temp 98.2 F (36.8 C) (Oral)   Resp 16   Ht '5\' 1"'$  (1.549 m)   Wt 101 lb (45.8 kg)   LMP 12/28/2010   SpO2 99%   BMI 19.08 kg/m    Physical Exam Vitals reviewed.  Constitutional:      General: She is not in acute distress. HENT:     Mouth/Throat:     Mouth: Mucous membranes are moist.  Cardiovascular:     Rate and Rhythm: Normal rate and regular rhythm.     Heart sounds: Normal heart sounds.  Pulmonary:     Effort: Pulmonary effort is normal.     Breath sounds: Normal breath sounds.  Abdominal:     General: Abdomen is flat.     Palpations: Abdomen is soft.  Genitourinary:    Comments: Negative CVA tenderness bilaterally  Musculoskeletal:     Cervical back: Neck supple.  Skin:    General: Skin is warm and dry.     Coloration: Skin is not jaundiced or pale.  Neurological:     Mental Status: She is alert and oriented to person, place, and time.  Psychiatric:        Mood and Affect: Mood normal.        Behavior: Behavior normal.        Judgment: Judgment normal.     MD Evaluation Airway: WNL Heart: WNL Abdomen: WNL Chest/ Lungs: WNL ASA  Classification: 3 Mallampati/Airway Score: Two  Imaging: No results found.  Labs:  CBC: Recent Labs    10/05/21 0630  WBC 8.2  HGB 12.3  HCT 36.9  PLT 246    COAGS: No  results for input(s): "INR", "APTT" in the last 8760 hours.  BMP: Recent Labs    04/09/21 1227 07/14/21 1000  NA 147* 137  K 5.4* 5.1  CL 106 101  CO2 26 22  GLUCOSE 88 179*  BUN 33* 42*  CALCIUM 9.1 9.0  CREATININE 2.22* 2.82*  GFRNONAA  --  20*    LIVER FUNCTION TESTS: Recent Labs    04/09/21 1227 05/01/21 1100  BILITOT 0.3 0.3  AST 66* 14  ALT 68* 19  ALKPHOS 110 100  PROT 6.5 6.1  ALBUMIN 3.7* 3.6*    TUMOR MARKERS: No results for input(s): "AFPTM", "CEA", "CA199", "CHROMGRNA" in the last 8760 hours.  Assessment and Plan: 51 y.o. female with history of CKD with nephrotic range proteinuria who is in need of appointment on renal biopsy for further evaluation and management.  Patient presents to [facility] IR today for image guided random renal biopsy. N.p.o. since midnight VS hypertensive, 169/72. She took Carvedilol this mooring - Dr. Denna Haggard notified.  CBC wnl INR pending  Glucose 597. Patient type 1 DM, did not have insuline this morning due to NPO, daughter states that her glucose has been hard to manage. Pt currently does not appears to be in DKA- Dr. Denna Haggard  notified  Patient is on Eliquis, has been held 3 days.   Risks and benefits of random renal biopsy was discussed with the patient and/or patient's family including, but not limited to bleeding, infection, damage to adjacent structures or low yield requiring additional tests.  All of the questions were answered and there is agreement to proceed.  Consent signed and in chart.     Thank you for this interesting consult.  I greatly enjoyed meeting NAYLIN BURKLE and look forward to participating in their care.  A copy of this report was sent to the requesting provider on this date.  Electronically Signed: Tera Mater, PA-C 10/05/2021, 7:30 AM   I spent a total of  30 Minutes   in face to face in clinical consultation, greater than 50% of which was counseling/coordinating care for random renal biopsy.  This  chart was dictated using voice recognition software.  Despite best efforts to proofread,  errors can occur which can change the documentation meaning.

## 2021-10-05 ENCOUNTER — Encounter: Payer: Self-pay | Admitting: Internal Medicine

## 2021-10-05 ENCOUNTER — Encounter (HOSPITAL_COMMUNITY): Payer: Self-pay

## 2021-10-05 ENCOUNTER — Ambulatory Visit (INDEPENDENT_AMBULATORY_CARE_PROVIDER_SITE_OTHER): Payer: Medicaid Other | Admitting: Internal Medicine

## 2021-10-05 ENCOUNTER — Ambulatory Visit (HOSPITAL_COMMUNITY)
Admission: RE | Admit: 2021-10-05 | Discharge: 2021-10-05 | Disposition: A | Discharge status: Home / Self Care | Payer: Medicaid Other | Source: Ambulatory Visit | Attending: Nephrology | Admitting: Nephrology

## 2021-10-05 DIAGNOSIS — N3000 Acute cystitis without hematuria: Secondary | ICD-10-CM

## 2021-10-05 DIAGNOSIS — I5042 Chronic combined systolic (congestive) and diastolic (congestive) heart failure: Secondary | ICD-10-CM | POA: Insufficient documentation

## 2021-10-05 DIAGNOSIS — N170 Acute kidney failure with tubular necrosis: Secondary | ICD-10-CM | POA: Diagnosis not present

## 2021-10-05 DIAGNOSIS — R809 Proteinuria, unspecified: Secondary | ICD-10-CM | POA: Insufficient documentation

## 2021-10-05 DIAGNOSIS — E1122 Type 2 diabetes mellitus with diabetic chronic kidney disease: Secondary | ICD-10-CM | POA: Diagnosis present

## 2021-10-05 DIAGNOSIS — N179 Acute kidney failure, unspecified: Secondary | ICD-10-CM | POA: Diagnosis present

## 2021-10-05 LAB — URINALYSIS, ROUTINE W REFLEX MICROSCOPIC
Bilirubin Urine: NEGATIVE
Glucose, UA: >=500 mg/dL — AB
Hgb urine dipstick: NEGATIVE
Ketones, ur: NEGATIVE mg/dL
Nitrite: NEGATIVE
Protein, ur: >=300 mg/dL — AB
Specific Gravity, Urine: 1.014 (ref 1.005–1.030)
WBC, UA: >50 WBC/hpf — ABNORMAL HIGH (ref 0–5)
pH: 7 (ref 5.0–8.0)

## 2021-10-05 LAB — GLUCOSE, CAPILLARY
Glucose-Capillary: 597 mg/dL (ref 70–99)
Glucose-Capillary: 508 mg/dL (ref 70–99)
Glucose-Capillary: 426 mg/dL — ABNORMAL HIGH (ref 70–99)

## 2021-10-05 LAB — CBC
HCT: 36.9 % (ref 36.0–46.0)
Hemoglobin: 12.3 g/dL (ref 12.0–15.0)
MCH: 31.3 pg (ref 26.0–34.0)
MCHC: 33.3 g/dL (ref 30.0–36.0)
MCV: 93.9 fL (ref 80.0–100.0)
Platelets: 246 10*3/uL (ref 150–400)
RBC: 3.93 MIL/uL (ref 3.87–5.11)
RDW: 13.2 % (ref 11.5–15.5)
WBC: 8.2 10*3/uL (ref 4.0–10.5)
nRBC: 0 % (ref 0.0–0.2)

## 2021-10-05 LAB — PROTIME-INR
INR: 0.9 (ref 0.8–1.2)
Prothrombin Time: 12 seconds (ref 11.4–15.2)

## 2021-10-05 MED ORDER — SODIUM CHLORIDE 0.9 % IV SOLN: INTRAVENOUS | Status: DC

## 2021-10-05 MED ORDER — INSULIN ASPART 100 UNIT/ML IJ SOLN
3.0000 [IU] | Freq: Once | INTRAMUSCULAR | Status: AC
  Filled 2021-10-05: qty 0.03

## 2021-10-05 MED ORDER — INSULIN ASPART 100 UNIT/ML IJ SOLN
INTRAMUSCULAR | Status: AC
  Administered 2021-10-05: 3 [IU] via SUBCUTANEOUS
  Filled 2021-10-05: qty 1

## 2021-10-05 MED ORDER — CEFUROXIME AXETIL 500 MG PO TABS: 500.0000 mg | ORAL_TABLET | Freq: Every day | ORAL | 0 refills | Status: AC

## 2021-10-05 NOTE — Progress Notes (Signed)
Virtual Visit via Telephone Note   This visit type was conducted due to national recommendations for restrictions regarding the COVID-19 Pandemic (e.g. social distancing) in an effort to limit this patient's exposure and mitigate transmission in our community.  Due to her co-morbid illnesses, this patient is at least at moderate risk for complications without adequate follow up.  This format is felt to be most appropriate for this patient at this time.  The patient did not have access to video technology/had technical difficulties with video requiring transitioning to audio format only (telephone).  All issues noted in this document were discussed and addressed.  No physical exam could be performed with this format.  Evaluation Performed:  Follow-up visit  Date:  10/05/2021   ID:  [name], DOB Aug 29, 1970, MRN [id number]  Patient Location: Home Provider Location: Office/Clinic  Participants: Patient's daughter - [name] Location of Patient: Home Location of Provider: Telehealth Consent was obtain for visit to be over via telehealth. I verified that I am speaking with the correct person using two identifiers.  PCP:  Lindell Spar, MD   Chief Complaint: Urinary frequency and foul-smelling urine  History of Present Illness:    [name] is a 51 y.o. female who has a televisit for complaint of urinary frequency and foul-smelling urine for the last few weeks.  She had to get renal biopsy today, but had urine testing done before the procedure and was told of UTI.  Of note, she was recently treated with Macrobid for E. coli UTI about 2 weeks ago.  Last urine culture was sensitive to Macrobid.  She denies any fever or chills currently.  She does report back pain and nausea, which are chronic.  The patient does not have symptoms concerning for COVID-19 infection (fever, chills, cough, or new shortness of breath).   Past Medical, Surgical, Social History, Allergies, and  Medications have been Reviewed.  Past Medical History:  Diagnosis Date   Acquired absence of left leg below knee ([place])    Anxiety    was on xanax for years but discontinued prior to prior PCP retiring as she was on opioids and was told she would not find someone to write for both   Arterial thromboembolism ([place])    Prior embolectomy, previously on Coumadin   Cataract    Phreesia 11/19/2019   Chronic kidney disease    Phreesia 11/19/2019   Clotting disorder ([place])    Phreesia 11/19/2019   Coronary atherosclerosis of native coronary artery    a. s/p DES to LAD in 2014   Depression    Depression    Phreesia 11/19/2019   Diabetes mellitus without complication ([place])    Phreesia 11/19/2019   Diabetic ketoacidosis with coma associated with type 1 diabetes mellitus ([place])    DKA, type 1 ([place]) 11/17/2016   Facial cellulitis    12/2010   Glomerulonephritis    Headache(784.0)    Hemiplegia, unspecified affecting right dominant side ([place])    History of stroke    Hyperlipidemia    Insulin dependent diabetes mellitus    History of diabetic ketoacidosis   Ischemic cardiomyopathy    a. EF 30-35% in 2018 b. EF at 45-50% by repeat imaging in 02/2019   Left carotid artery occlusion    Major depressive disorder, single episode, unspecified    Pancreatitis, acute 11/07/2016   Peripheral arterial disease ([place])    Shingles    11/2010   ST elevation myocardial infarction (STEMI)  of anterior wall [facility])    Late presentation September 2014   Stroke [facility])    2018   Upper abdominal pain 06/03/2021   Past Surgical History:  Procedure Laterality Date   AMPUTATION  03/03/2011   Procedure: AMPUTATION DIGIT;  Surgeon: Newt Minion, MD;  Location: [place];  Service: Orthopedics;  Laterality: Left;  Left Foot Amputation 4th and 5th toes at MTP joint   AMPUTATION  04/22/2011   Procedure: AMPUTATION BELOW KNEE;  Surgeon: Newt Minion, MD;  Location: [place];  Service: Orthopedics;  Laterality: Left;  Left  Below Knee Amputation   BIOPSY  07/17/2021   Procedure: BIOPSY;  Surgeon: Eloise Harman, DO;  [facility];  Service: Endoscopy;;   CATARACT EXTRACTION W/PHACO Left 01/09/2020   Procedure: CATARACT EXTRACTION PHACO AND INTRAOCULAR LENS PLACEMENT (IOC);  Surgeon: Baruch Goldmann, MD;  [facility];  Service: Ophthalmology;  Laterality: Left;  CDE: 9.73   DILATION AND CURETTAGE OF UTERUS     EMBOLECTOMY  01/29/2011   Procedure: EMBOLECTOMY;  Surgeon: Mal Misty, MD;  Location[facility] OR;  Service: Vascular;  Laterality: Left;  Left Popliteal and Tibial Embolectomy with patch angioplasty   ESOPHAGOGASTRODUODENOSCOPY (EGD) WITH PROPOFOL N/A 07/17/2021   Procedure: ESOPHAGOGASTRODUODENOSCOPY (EGD) WITH PROPOFOL;  Surgeon: Eloise Harman, DO;  [facility];  Service: Endoscopy;  Laterality: N/A;  1:00pm   INCISION AND DRAINAGE ABSCESS N/A 04/06/2016   Procedure: INCISION AND DRAINAGE ABSCESS;  Surgeon: Jonnie Kind, MD;  [facility];  Service: Gynecology;  Laterality: N/A;   LEFT HEART CATHETERIZATION WITH CORONARY ANGIOGRAM N/A 10/01/2012   Procedure: LEFT HEART CATHETERIZATION WITH CORONARY ANGIOGRAM;  Surgeon: Peter M Martinique, MD;  Location: [facility] CATH LAB;  Service: Cardiovascular;  Laterality: N/A;   LOWER EXTREMITY ANGIOGRAM N/A 01/27/2011   Procedure: LOWER EXTREMITY ANGIOGRAM;  Surgeon: Rosetta Posner, MD;  Location: [facility] C[facility] LAB;  Service: Cardiovascular;  Laterality: N/A;   LOWER EXTREMITY ANGIOGRAM N/A 01/28/2011   Procedure: LOWER EXTREMITY ANGIOGRAM;  Surgeon: Conrad Martin, MD;  Location: [facility] CATH LAB;  Service: Cardiovascular;  Laterality: N/A;   LOWER EXTREMITY ANGIOGRAM Left 01/29/2011   Procedure: LOWER EXTREMITY ANGIOGRAM;  Surgeon: Mal Misty, MD;  Location: [facility] CATH LAB;  Service: Cardiovascular;  Laterality: Left;   MULTIPLE TOOTH EXTRACTIONS     TEE WITHOUT CARDIOVERSION  04/15/2011   Procedure: TRANSESOPHAGEAL ECHOCARDIOGRAM (TEE);  Surgeon: [name], MD;  [facility];  Service: Cardiovascular;  Laterality: N/A;   WRIST SURGERY     Left     Current Meds  Medication Sig   acetaminophen (TYLENOL) 500 MG tablet Take 500 mg by mouth every 6 (six) hours as needed for moderate pain.   apixaban (ELIQUIS) 2.5 MG TABS tablet Take 1 tablet (2.5 mg total) by mouth 2 (two) times daily. (Patient taking differently: Take 2.5 mg by mouth at bedtime.)   atorvastatin (LIPITOR) 80 MG tablet Take 1 tablet (80 mg total) by mouth daily.   calcium carbonate (TUMS EX) 750 MG chewable tablet Chew 1 tablet by mouth daily as needed for heartburn.   carvedilol (COREG) 6.25 MG tablet Take 1 tablet (6.25 mg total) by mouth 2 (two) times daily with a meal. (Patient taking differently: Take 6.25 mg by mouth at bedtime.)   cetirizine (ZYRTEC) 10 MG tablet Take 10 mg by mouth daily as needed for allergies.   glucose blood (ACCU-CHEK GUIDE) test strip Use as instructed  to monitor glucose 4 times daily, before meals and before bed.   hydrALAZINE (APRESOLINE) 25 MG tablet Take 0.5 tablets (12.5 mg total) by mouth 2 (two) times daily. (Patient taking differently: Take 12.5 mg by mouth at bedtime.)   insulin aspart (NOVOLOG) 100 UNIT/ML FlexPen Inject 2-8 Units into the skin 3 (three) times daily with meals. (Patient taking differently: Inject 1-7 Units into the skin 3 (three) times daily with meals.)   Insulin Pen Needle (UNIFINE PENTIPS) 31G X 8 MM MISC USE TO INJECT INSULIN SUBCUTANEOUSLY . USE FOR BOTH SLIDING SCALE AND FOR DAILYINSULIN DOSES.   LINZESS 72 MCG capsule Take 72 mcg by mouth daily.   mirtazapine (REMERON) 15 MG tablet Take 1 tablet (15 mg total) by mouth at bedtime.   nitroGLYCERIN (NITROSTAT) 0.4 MG SL tablet Place 1 tablet (0.4 mg total) under the tongue every 5 (five) minutes as needed for chest pain.   omeprazole (PRILOSEC) 20 MG capsule Take 1 capsule (20 mg total) by mouth daily.   ondansetron (ZOFRAN) 4 MG tablet TAKE ONE TABLET  ('4MG'$ ) BY MOUTH EVERY 4 TO 6 HOURS AS NEEDED FOR NAUSEA/VOMITING   oxyCODONE-acetaminophen (PERCOCET) 10-325 MG tablet Take 0.5 tablets every 4 (four) hours as needed by mouth for pain. (Patient taking differently: Take 0.5-1 tablets by mouth every 4 (four) hours as needed for pain.)   simethicone (GAS-X) 80 MG chewable tablet Chew 80 mg by mouth every 6 (six) hours as needed for flatulence.   TRESIBA FLEXTOUCH 100 UNIT/ML FlexTouch Pen Inject 10 Units into the skin at bedtime.     Allergies:   Ibuprofen, Losartan, and Mirtazapine   ROS:   Please see the history of present illness.     All other systems reviewed and are negative.   Labs/Other Tests and Data Reviewed:    Recent Labs: 04/09/2021: TSH 3.070 05/01/2021: ALT 19 07/14/2021: BUN 42; Creatinine, Ser 2.82; Potassium 5.1; Sodium 137 10/05/2021: Hemoglobin 12.3; Platelets 246   Recent Lipid Panel Lab Results  Component Value Date/Time   CHOL 151 04/09/2021 12:27 PM   TRIG 75 04/09/2021 12:27 PM   HDL 59 04/09/2021 12:27 PM   CHOLHDL 2.6 04/09/2021 12:27 PM   CHOLHDL 4.2 11/07/2016 07:22 AM   LDLCALC 77 04/09/2021 12:27 PM    Wt Readings from Last 3 Encounters:  10/05/21 101 lb (45.8 kg)  09/18/21 103 lb (46.7 kg)  08/03/21 99 lb (44.9 kg)     ASSESSMENT & PLAN:    UTI UA reviewed from today -small LE, negative nitrite Has urinary frequency and foul-smelling urine Started cefuroxime 500 mg daily-renally dosed Last urine culture showed E. coli-sensitive to Macrobid and cephalosporins Advised to maintain adequate hydration  Time:   Today, I have spent 15 minutes reviewing the chart, including problem list, medications, and with the patient with telehealth technology discussing the above problems.   Medication Adjustments/Labs and Tests Ordered: Current medicines are reviewed at length with the patient today.  Concerns regarding medicines are outlined above.   Tests Ordered: No orders of the defined types were  placed in this encounter.   Medication Changes: No orders of the defined types were placed in this encounter.    Note: This dictation was prepared with Dragon dictation along with smaller phrase technology. Similar sounding words can be transcribed inadequately or may not be corrected upon review. Any transcriptional errors that result from this process are unintentional.      Disposition:  Follow up  Signed, Lindell Spar, MD  10/05/2021 1:58 PM     [facility]

## 2021-10-05 NOTE — Sedation Documentation (Signed)
Per MD procedure cancelled due to elevated blood sugar, blood pressure, and + urinalysis.  IV D/C, assisted pt with dressing and transported via wheelchair to main entrance for discharge home with daughter. Pt and daughter verbalize understanding.

## 2021-10-05 NOTE — Telephone Encounter (Signed)
schedule

## 2021-10-05 NOTE — Sedation Documentation (Signed)
MD aware of glucose readings. Awaiting results of urinalysis at this time. Pt is resting, daughter at bedside.

## 2021-10-06 ENCOUNTER — Encounter (INDEPENDENT_AMBULATORY_CARE_PROVIDER_SITE_OTHER): Payer: Self-pay

## 2021-10-06 LAB — COLOGUARD: COLOGUARD: POSITIVE — AB

## 2021-10-13 ENCOUNTER — Other Ambulatory Visit: Payer: Self-pay | Admitting: *Deleted

## 2021-10-13 ENCOUNTER — Encounter: Payer: Self-pay | Admitting: Internal Medicine

## 2021-10-13 DIAGNOSIS — R195 Other fecal abnormalities: Secondary | ICD-10-CM

## 2021-10-14 ENCOUNTER — Encounter: Payer: Self-pay | Admitting: *Deleted

## 2021-11-05 ENCOUNTER — Encounter: Payer: Self-pay | Admitting: Cardiology

## 2021-11-05 ENCOUNTER — Ambulatory Visit: Payer: Medicaid Other | Attending: Cardiology | Admitting: Cardiology

## 2021-11-05 VITALS — BP 142/80 | HR 74 | Ht 61.0 in | Wt 101.8 lb

## 2021-11-05 DIAGNOSIS — I25119 Atherosclerotic heart disease of native coronary artery with unspecified angina pectoris: Secondary | ICD-10-CM

## 2021-11-05 DIAGNOSIS — I5020 Unspecified systolic (congestive) heart failure: Secondary | ICD-10-CM | POA: Diagnosis present

## 2021-11-05 DIAGNOSIS — I5022 Chronic systolic (congestive) heart failure: Secondary | ICD-10-CM | POA: Diagnosis present

## 2021-11-05 DIAGNOSIS — N184 Chronic kidney disease, stage 4 (severe): Secondary | ICD-10-CM | POA: Diagnosis present

## 2021-11-05 DIAGNOSIS — E782 Mixed hyperlipidemia: Secondary | ICD-10-CM | POA: Diagnosis present

## 2021-11-05 NOTE — Progress Notes (Signed)
Cardiology Office Note  Date: 11/05/2021   ID: [name], DOB 15-Nov-1970, MRN [id number]  PCP:  Lindell Spar, MD  Cardiologist:  Rozann Lesches, MD Electrophysiologist:  None   Chief Complaint  Patient presents with   Cardiac follow-up    History of Present Illness: [name] is a 51 y.o. female last seen in October 2022 by Delano Metz, I reviewed the note.  She is here today with her daughter for follow-up visit.  From a cardiac perspective, she does not report any chest pain, stable NYHA class II dyspnea, no significant weight gain.  We went over her medications and discussed doses.  She has been taking things correctly of late including twice daily doses.  She is on Coreg, Eliquis, Lipitor, hydralazine, and as needed nitroglycerin.  Otherwise no diuretic therapy.  She has an allergy to ARB's. Also CKD stage IV with creatinine 2.6.  She is following with Dr. Theador Hawthorne at this point.  Echocardiogram from December 2022 revealed LVEF 40 to 45% range with moderate asymmetric basal septal hypertrophy, mild diastolic dysfunction, normal RV contraction.  I personally reviewed her ECG today which shows sinus rhythm with right atrial enlargement, left anterior fascicular block, pulmonary disease pattern, and poor R wave progression with anterolateral T wave inversions that are old.  Past Medical History:  Diagnosis Date   Acquired absence of left leg below knee ([place])    Anxiety    was on xanax for years but discontinued prior to prior PCP retiring as she was on opioids and was told she would not find someone to write for both   Arterial thromboembolism ([place])    Prior embolectomy, previously on Coumadin   Cataract    Phreesia 11/19/2019   Chronic kidney disease    Phreesia 11/19/2019   Clotting disorder ([place])    Phreesia 11/19/2019   Coronary atherosclerosis of native coronary artery    a. s/p DES to LAD in 2014   Depression    Depression    Phreesia 11/19/2019    Diabetes mellitus without complication ([place])    Phreesia 11/19/2019   Diabetic ketoacidosis with coma associated with type 1 diabetes mellitus ([place])    DKA, type 1 ([place]) 11/17/2016   Facial cellulitis    12/2010   Glomerulonephritis    Headache(784.0)    Hemiplegia, unspecified affecting right dominant side ([place])    History of stroke    Hyperlipidemia    Insulin dependent diabetes mellitus    History of diabetic ketoacidosis   Ischemic cardiomyopathy    a. EF 30-35% in 2018 b. EF at 45-50% by repeat imaging in 02/2019   Left carotid artery occlusion    Major depressive disorder, single episode, unspecified    Pancreatitis, acute 11/07/2016   Peripheral arterial disease ([place])    Shingles    11/2010   ST elevation myocardial infarction (STEMI) of anterior wall [facility]    Late presentation September 2014   Stroke [facility])    2018   Upper abdominal pain 06/03/2021    Past Surgical History:  Procedure Laterality Date   AMPUTATION  03/03/2011   Procedure: AMPUTATION DIGIT;  Surgeon: Newt Minion, MD;  Location: [place];  Service: Orthopedics;  Laterality: Left;  Left Foot Amputation 4th and 5th toes at MTP joint   AMPUTATION  04/22/2011   Procedure: AMPUTATION BELOW KNEE;  Surgeon: Newt Minion, MD;  Location: [place];  Service: Orthopedics;  Laterality: Left;  Left Below Knee Amputation  BIOPSY  07/17/2021   Procedure: BIOPSY;  Surgeon: Eloise Harman, DO;  [facility];  Service: Endoscopy;;   CATARACT EXTRACTION W/PHACO Left 01/09/2020   Procedure: CATARACT EXTRACTION PHACO AND INTRAOCULAR LENS PLACEMENT (IOC);  Surgeon: Baruch Goldmann, MD;  [facility];  Service: Ophthalmology;  Laterality: Left;  CDE: 9.73   DILATION AND CURETTAGE OF UTERUS     EMBOLECTOMY  01/29/2011   Procedure: EMBOLECTOMY;  Surgeon: Mal Misty, MD;  Location: [facility] OR;  Service: Vascular;  Laterality: Left;  Left Popliteal and Tibial Embolectomy with patch angioplasty   ESOPHAGOGASTRODUODENOSCOPY  (EGD) WITH PROPOFOL N/A 07/17/2021   Procedure: ESOPHAGOGASTRODUODENOSCOPY (EGD) WITH PROPOFOL;  Surgeon: Eloise Harman, DO;  [facility];  Service: Endoscopy;  Laterality: N/A;  1:00pm   INCISION AND DRAINAGE ABSCESS N/A 04/06/2016   Procedure: INCISION AND DRAINAGE ABSCESS;  Surgeon: Jonnie Kind, MD;  [facility];  Service: Gynecology;  Laterality: N/A;   LEFT HEART CATHETERIZATION WITH CORONARY ANGIOGRAM N/A 10/01/2012   Procedure: LEFT HEART CATHETERIZATION WITH CORONARY ANGIOGRAM;  Surgeon: Peter M Martinique, MD;  Location: [facility] CATH LAB;  Service: Cardiovascular;  Laterality: N/A;   LOWER EXTREMITY ANGIOGRAM N/A 01/27/2011   Procedure: LOWER EXTREMITY ANGIOGRAM;  Surgeon: Rosetta Posner, MD;  Location: [facility] CATH LAB;  Service: Cardiovascular;  Laterality: N/A;   LOWER EXTREMITY ANGIOGRAM N/A 01/28/2011   Procedure: LOWER EXTREMITY ANGIOGRAM;  Surgeon: Conrad Kinney, MD;  Location: [facility] CATH LAB;  Service: Cardiovascular;  Laterality: N/A;   LOWER EXTREMITY ANGIOGRAM Left 01/29/2011   Procedure: LOWER EXTREMITY ANGIOGRAM;  Surgeon: Mal Misty, MD;  Location: [facility] CATH LAB;  Service: Cardiovascular;  Laterality: Left;   MULTIPLE TOOTH EXTRACTIONS     TEE WITHOUT CARDIOVERSION  04/15/2011   Procedure: TRANSESOPHAGEAL ECHOCARDIOGRAM (TEE);  Surgeon: [name], MD;  [facility];  Service: Cardiovascular;  Laterality: N/A;   WRIST SURGERY     Left    Current Outpatient Medications  Medication Sig Dispense Refill   acetaminophen (TYLENOL) 500 MG tablet Take 500 mg by mouth every 6 (six) hours as needed for moderate pain.     apixaban (ELIQUIS) 2.5 MG TABS tablet Take 1 tablet (2.5 mg total) by mouth 2 (two) times daily. 180 tablet 3   atorvastatin (LIPITOR) 80 MG tablet Take 1 tablet (80 mg total) by mouth daily. 90 tablet 3   calcium carbonate (TUMS EX) 750 MG chewable tablet Chew 1 tablet by mouth daily as needed for heartburn.     carvedilol (COREG) 6.25 MG  tablet Take 1 tablet (6.25 mg total) by mouth 2 (two) times daily with a meal. 180 tablet 3   cetirizine (ZYRTEC) 10 MG tablet Take 10 mg by mouth daily as needed for allergies.     glucose blood (ACCU-CHEK GUIDE) test strip Use as instructed to monitor glucose 4 times daily, before meals and before bed. 400 each 12   hydrALAZINE (APRESOLINE) 25 MG tablet Take 0.5 tablets (12.5 mg total) by mouth 2 (two) times daily. (Patient taking differently: Take 12.5 mg by mouth in the morning and at bedtime.) 90 tablet 3   insulin aspart (NOVOLOG) 100 UNIT/ML FlexPen Inject 2-8 Units into the skin 3 (three) times daily with meals. (Patient taking differently: Inject 1-7 Units into the skin 3 (three) times daily with meals.) 9 mL 0   Insulin Pen Needle (UNIFINE PENTIPS) 31G X 8 MM MISC USE TO INJECT INSULIN SUBCUTANEOUSLY . USE FOR  BOTH SLIDING SCALE AND FOR DAILYINSULIN DOSES. 100 each 11   LINZESS 72 MCG capsule Take 72 mcg by mouth daily.     mirtazapine (REMERON) 15 MG tablet Take 1 tablet (15 mg total) by mouth at bedtime. 30 tablet 5   nitroGLYCERIN (NITROSTAT) 0.4 MG SL tablet Place 1 tablet (0.4 mg total) under the tongue every 5 (five) minutes as needed for chest pain. 25 tablet 3   omeprazole (PRILOSEC) 20 MG capsule Take 1 capsule (20 mg total) by mouth daily. 30 capsule 3   ondansetron (ZOFRAN) 4 MG tablet TAKE ONE TABLET ('4MG'$ ) BY MOUTH EVERY 4 TO 6 HOURS AS NEEDED FOR NAUSEA/VOMITING 30 tablet 0   oxyCODONE-acetaminophen (PERCOCET) 10-325 MG tablet Take 0.5 tablets every 4 (four) hours as needed by mouth for pain. (Patient taking differently: Take 0.5-1 tablets by mouth every 4 (four) hours as needed for pain.) 120 tablet 0   simethicone (GAS-X) 80 MG chewable tablet Chew 80 mg by mouth every 6 (six) hours as needed for flatulence.     TRESIBA FLEXTOUCH 100 UNIT/ML FlexTouch Pen Inject 10 Units into the skin at bedtime.     No current facility-administered medications for this visit.   Allergies:   Ibuprofen, Losartan, and Mirtazapine   ROS: No orthopnea or PND.  Physical Exam: VS:  BP (!) 142/80   Pulse 74   Ht '5\' 1"'$  (1.549 m)   Wt 101 lb 12.8 oz (46.2 kg)   LMP 12/28/2010   SpO2 99%   BMI 19.23 kg/m , BMI Body mass index is 19.23 kg/m.  Wt Readings from Last 3 Encounters:  11/05/21 101 lb 12.8 oz (46.2 kg)  10/05/21 101 lb (45.8 kg)  09/18/21 103 lb (46.7 kg)    General: Patient appears comfortable at rest. HEENT: Conjunctiva and lids normal. Neck: Supple, no elevated JVP, left carotid bruit. Lungs: Decreased breath sounds without wheezing, nonlabored breathing at rest. Cardiac: Regular rate and rhythm, no S3, 1/6 systolic murmur. Abdomen: Soft, nontender, no hepatomegaly, bowel sounds present, no guarding or rebound. Extremities: Status post left BKA with prosthesis in place..  ECG:  An ECG dated 11/06/2020 was personally reviewed today and demonstrated:  Sinus rhythm with decreased R wave progression and nonspecific ST-T changes.  Recent Labwork: 04/09/2021: TSH 3.070 05/01/2021: ALT 19; AST 14 07/14/2021: BUN 42; Creatinine, Ser 2.82; Potassium 5.1; Sodium 137 10/05/2021: Hemoglobin 12.3; Platelets 246     Component Value Date/Time   CHOL 151 04/09/2021 1227   TRIG 75 04/09/2021 1227   HDL 59 04/09/2021 1227   CHOLHDL 2.6 04/09/2021 1227   CHOLHDL 4.2 11/07/2016 0722   VLDL 30 11/07/2016 0722   LDLCALC 77 04/09/2021 1227    Other Studies Reviewed Today:  Echocardiogram 12/19/2020:  1. Left ventricular ejection fraction, by estimation, is 40 to 45%. The  left ventricle has mildly decreased function. The left ventricle  demonstrates regional wall motion abnormalities (see scoring  diagram/findings for description). There is moderate  asymmetric left ventricular hypertrophy of the basal segment. Left  ventricular diastolic parameters are consistent with Grade I diastolic  dysfunction (impaired relaxation). Apical trabeculation without obvious  thrombus.    2. Right ventricular systolic function is normal. The right ventricular  size is normal.   3. The mitral valve is abnormal. Trivial mitral valve regurgitation.  Moderate mitral annular calcification.   4. The aortic valve is tricuspid. Aortic valve regurgitation is not  visualized.   5. The inferior vena cava is normal in size with  greater than 50%  respiratory variability, suggesting right atrial pressure of 3 mmHg.   Assessment and Plan:  1.  HFmrEF with LVEF approximately 45% and wall motion maladies consistent with ischemic cardiomyopathy.  Does not appear fluid overloaded.  GDMT limited by CKD stage IV and also ARB allergy.  Currently on Coreg and hydralazine, no standing diuretic.  2.  CAD status post DES to the LAD in 2014.  ECG chronically abnormal.  No obvious angina reported.  Not on aspirin given use of Eliquis.  She remains on high-dose Lipitor.  Last LDL 77.  3.  CKD stage IV, creatinine 2.6 most recently.  Continue to follow with Dr. Verne Spurr.  4.  Protein C and S deficiency as well as lupus anticoagulant positive.  She remains on Eliquis.  Medication Adjustments/Labs and Tests Ordered: Current medicines are reviewed at length with the patient today.  Concerns regarding medicines are outlined above.   Tests Ordered: Orders Placed This Encounter  Procedures   EKG 12-Lead    Medication Changes: No orders of the defined types were placed in this encounter.   Disposition:  Follow up  6 months.  Signed, Satira Sark, MD, [facility] 11/05/2021 1:48 PM    [facility] at [facility] [address], [place], [place] [address] Phone: [phone]; Fax: [phone]

## 2021-11-05 NOTE — Patient Instructions (Signed)
Medication Instructions:  Your physician recommends that you continue on your current medications as directed. Please refer to the Current Medication list given to you today.   Labwork: None today  Testing/Procedures: None today  Follow-Up: 6 months  Any Other Special Instructions Will Be Listed Below (If Applicable).  If you need a refill on your cardiac medications before your next appointment, please call your pharmacy.  

## 2021-11-09 ENCOUNTER — Ambulatory Visit (INDEPENDENT_AMBULATORY_CARE_PROVIDER_SITE_OTHER): Payer: Medicaid Other | Admitting: Gastroenterology

## 2021-11-09 ENCOUNTER — Telehealth: Payer: Self-pay | Admitting: Gastroenterology

## 2021-11-09 ENCOUNTER — Encounter: Payer: Self-pay | Admitting: *Deleted

## 2021-11-09 ENCOUNTER — Encounter: Payer: Self-pay | Admitting: Gastroenterology

## 2021-11-09 VITALS — BP 117/70 | HR 76 | Temp 98.2°F | Ht 61.0 in | Wt 102.8 lb

## 2021-11-09 DIAGNOSIS — K5900 Constipation, unspecified: Secondary | ICD-10-CM | POA: Diagnosis not present

## 2021-11-09 DIAGNOSIS — R112 Nausea with vomiting, unspecified: Secondary | ICD-10-CM | POA: Diagnosis not present

## 2021-11-09 DIAGNOSIS — R195 Other fecal abnormalities: Secondary | ICD-10-CM

## 2021-11-09 DIAGNOSIS — K219 Gastro-esophageal reflux disease without esophagitis: Secondary | ICD-10-CM

## 2021-11-09 DIAGNOSIS — R109 Unspecified abdominal pain: Secondary | ICD-10-CM | POA: Insufficient documentation

## 2021-11-09 MED ORDER — CLENPIQ 10-3.5-12 MG-GM -GM/175ML PO SOLN: 1.0000 | ORAL | 0 refills | Status: DC

## 2021-11-09 MED ORDER — LINACLOTIDE 145 MCG PO CAPS: 145.0000 ug | ORAL_CAPSULE | Freq: Every day | ORAL | 5 refills | Status: DC

## 2021-11-09 MED ORDER — ONDANSETRON HCL 4 MG PO TABS: ORAL_TABLET | ORAL | 0 refills | Status: DC

## 2021-11-09 NOTE — Progress Notes (Signed)
GI Office Note    Referring Provider: Lindell Spar, MD Primary Care Physician:  Lindell Spar, MD  Primary Gastroenterologist: Elon Alas. Abbey Chatters, DO   Chief Complaint   Chief Complaint  Patient presents with   Follow-up    Positive cologuard, still having nausea and vomiting.     History of Present Illness   [name] is a 51 y.o. female presenting today at the request of Dr. Posey Pronto for positive Cologuard (09/2021). No prior colonoscopy. 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  Patient presents with daughter for follow up today to discuss positive Cologuard. Patient last seen in the office back in May 2023.  At that time complained of constant abdominal pain for years, pain predominantly right-sided.  Complained of nausea, poor appetite.  Complained of pill dysphagia resulting in cough, then vomiting.  Chronic constipation felt to be related to pain medication.  Responded to Linzess 72 mcg daily.  She has a history of chronic anxiety, previously was on Xanax for years but was weaned off prior to Dr. Luan Pulling retiring, he feared she would not find another provider to give her both Xanax and narcotics.  She has been seen by psychiatry in the past but refused further appointments.    EGD completed since last visit as outlined. She states she continues to vomit a couple of times per week. Can happen anytime. Have not been able to correlate with any particular activity. Sugars seem to be okay during episodes. Vomiting is not preceded by abdominal pain or nausea. She continues to have right sided abdominal pain and pain in the epigastric region that radiates into the chest. Has tried Gas relief pills and TUMS, omeprazole, Pepto that helps some.  Denies solid food dysphagia. Her appetite has been poor, eating  like a bird the last two weeks. BMs doing better on Linzess but still only have a BM 1-2 times per week. No melena, brbpr.    Prior work up: Patient was seen by our group in October 2018 while inpatient with idiopathic pancreatitis in the setting of glucose greater than 500.  CT abdomen pelvis with contrast at presentation October 2018 showed normal pancreas. Lipase was 1797. Right upper quadrant ultrasound at the same time with no gallstones.  CBD 4.2 mm.  Completed outpatient MRCP November 2018 for idiopathic pancreatitis, severe motion artifact seen despite efforts of the technologist and patient, no biliary dilatation or abnormal biliary filling defects seen, pancreas unremarkable with no findings of pancreas divisum.  She was referred to general surgery for consideration of cholecystectomy. Patient never went to surgery consult due to having stroke with prolonged hospitalization/rehabilitation.    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 Positive lupus anticoagulant with protein C and protein S deficiency. Was on Eliquis at the time of CVA.     CT abdomen pelvis with contrast July 2021 unremarkable.   Recent labs April 2023: GGT 41, albumin 3.6, total bilirubin 0.3, alkaline phosphatase 100, AST 14, ALT 19. In March 2023 her AST was 66, ALT 68.   Labs from March 2023: A1c 11.6, had been 14 in July 2022.   Labs from May 2022: Hemoglobin 13.8, MCV 93.8.  Iron 87, TIBC 245, iron saturations 36%, ferritin 24, B12 337, hepatitis B surface antigen negative, hepatitis B surface antigen negative, hepatitis  C antibody negative.  ANA positive, 1:80 titer  Reviewed ultrasound report dated August 2022: -Gallbladder normal -Common bile duct measures 0.36 cm -2 cm cyst right kidney  EGD 06/2021: - Z-line regular, 38 cm from the incisors. - Gastritis. Biopsied. - Normal duodenal bulb, first portion of the duodenum and second portion of the duodenum.    Medications   Current  Outpatient Medications  Medication Sig Dispense Refill   acetaminophen (TYLENOL) 500 MG tablet Take 500 mg by mouth every 6 (six) hours as needed for moderate pain.     apixaban (ELIQUIS) 2.5 MG TABS tablet Take 1 tablet (2.5 mg total) by mouth 2 (two) times daily. 180 tablet 3   atorvastatin (LIPITOR) 80 MG tablet Take 1 tablet (80 mg total) by mouth daily. 90 tablet 3   calcium carbonate (TUMS EX) 750 MG chewable tablet Chew 1 tablet by mouth daily as needed for heartburn.     carvedilol (COREG) 6.25 MG tablet Take 1 tablet (6.25 mg total) by mouth 2 (two) times daily with a meal. 180 tablet 3   cetirizine (ZYRTEC) 10 MG tablet Take 10 mg by mouth daily as needed for allergies.     glucose blood (ACCU-CHEK GUIDE) test strip Use as instructed to monitor glucose 4 times daily, before meals and before bed. 400 each 12   hydrALAZINE (APRESOLINE) 25 MG tablet Take 0.5 tablets (12.5 mg total) by mouth 2 (two) times daily. (Patient taking differently: Take 12.5 mg by mouth in the morning and at bedtime.) 90 tablet 3   insulin aspart (NOVOLOG) 100 UNIT/ML FlexPen Inject 2-8 Units into the skin 3 (three) times daily with meals. (Patient taking differently: Inject 1-7 Units into the skin 3 (three) times daily with meals.) 9 mL 0   Insulin Pen Needle (UNIFINE PENTIPS) 31G X 8 MM MISC USE TO INJECT INSULIN SUBCUTANEOUSLY . USE FOR BOTH SLIDING SCALE AND FOR DAILYINSULIN DOSES. 100 each 11   LINZESS 72 MCG capsule Take 72 mcg by mouth daily.     mirtazapine (REMERON) 15 MG tablet Take 1 tablet (15 mg total) by mouth at bedtime. 30 tablet 5   nitroGLYCERIN (NITROSTAT) 0.4 MG SL tablet Place 1 tablet (0.4 mg total) under the tongue every 5 (five) minutes as needed for chest pain. 25 tablet 3   omeprazole (PRILOSEC) 20 MG capsule Take 1 capsule (20 mg total) by mouth daily. 30 capsule 3   ondansetron (ZOFRAN) 4 MG tablet TAKE ONE TABLET ('4MG'$ ) BY MOUTH EVERY 4 TO 6 HOURS AS NEEDED FOR NAUSEA/VOMITING 30 tablet 0    oxyCODONE-acetaminophen (PERCOCET) 10-325 MG tablet Take 0.5 tablets every 4 (four) hours as needed by mouth for pain. (Patient taking differently: Take 0.5-1 tablets by mouth every 4 (four) hours as needed for pain.) 120 tablet 0   simethicone (GAS-X) 80 MG chewable tablet Chew 80 mg by mouth every 6 (six) hours as needed for flatulence.     TRESIBA FLEXTOUCH 100 UNIT/ML FlexTouch Pen Inject 10 Units into the skin at bedtime.     No current facility-administered medications for this visit.    Allergies   Allergies as of 11/09/2021 - Review Complete 11/09/2021  Allergen Reaction Noted   Ibuprofen Other (See Comments) 11/03/2010   Losartan Swelling 04/23/2011   Mirtazapine  09/30/2021     Past Medical History   Past Medical History:  Diagnosis Date   Acquired absence of left leg below knee (HCC)    Anxiety    was on xanax for  years but discontinued prior to prior PCP retiring as she was on opioids and was told she would not find someone to write for both   Arterial thromboembolism ([place])    Prior embolectomy, previously on Coumadin   Cataract    Phreesia 11/19/2019   Chronic kidney disease    Phreesia 11/19/2019   Clotting disorder ([place])    Phreesia 11/19/2019   Coronary atherosclerosis of native coronary artery    a. s/p DES to LAD in 2014   Depression    Depression    Phreesia 11/19/2019   Diabetes mellitus without complication ([place])    Phreesia 11/19/2019   Diabetic ketoacidosis with coma associated with type 1 diabetes mellitus ([place])    DKA, type 1 ([place]) 11/17/2016   Facial cellulitis    12/2010   Glomerulonephritis    Headache(784.0)    Hemiplegia, unspecified affecting right dominant side ([place])    History of stroke    Hyperlipidemia    Insulin dependent diabetes mellitus    History of diabetic ketoacidosis   Ischemic cardiomyopathy    a. EF 30-35% in 2018 b. EF at 45-50% by repeat imaging in 02/2019   Left carotid artery occlusion    Major depressive disorder,  single episode, unspecified    Pancreatitis, acute 11/07/2016   Peripheral arterial disease ([place])    Shingles    11/2010   ST elevation myocardial infarction (STEMI) of anterior wall [facility])    Late presentation September 2014   Stroke [facility])    2018   Upper abdominal pain 06/03/2021    Past Surgical History   Past Surgical History:  Procedure Laterality Date   AMPUTATION  03/03/2011   Procedure: AMPUTATION DIGIT;  Surgeon: Newt Minion, MD;  Location: [place];  Service: Orthopedics;  Laterality: Left;  Left Foot Amputation 4th and 5th toes at MTP joint   AMPUTATION  04/22/2011   Procedure: AMPUTATION BELOW KNEE;  Surgeon: Newt Minion, MD;  Location: [place];  Service: Orthopedics;  Laterality: Left;  Left Below Knee Amputation   BIOPSY  07/17/2021   Procedure: BIOPSY;  Surgeon: Eloise Harman, DO;  [facility];  Service: Endoscopy;;   CATARACT EXTRACTION W/PHACO Left 01/09/2020   Procedure: CATARACT EXTRACTION PHACO AND INTRAOCULAR LENS PLACEMENT (IOC);  Surgeon: Baruch Goldmann, MD;  Location: [facility];  Service: Ophthalmology;  Laterality: Left;  CDE: 9.73   DILATION AND CURETTAGE OF UTERUS     EMBOLECTOMY  01/29/2011   Procedure: EMBOLECTOMY;  Surgeon: Mal Misty, MD;  Location: [facility] OR;  Service: Vascular;  Laterality: Left;  Left Popliteal and Tibial Embolectomy with patch angioplasty   ESOPHAGOGASTRODUODENOSCOPY (EGD) WITH PROPOFOL N/A 07/17/2021   Procedure: ESOPHAGOGASTRODUODENOSCOPY (EGD) WITH PROPOFOL;  Surgeon: Eloise Harman, DO;  [facility];  Service: Endoscopy;  Laterality: N/A;  1:00pm   INCISION AND DRAINAGE ABSCESS N/A 04/06/2016   Procedure: INCISION AND DRAINAGE ABSCESS;  Surgeon: Jonnie Kind, MD;  Location: [facility];  Service: Gynecology;  Laterality: N/A;   LEFT HEART CATHETERIZATION WITH CORONARY ANGIOGRAM N/A 10/01/2012   Procedure: LEFT HEART CATHETERIZATION WITH CORONARY ANGIOGRAM;  Surgeon: Peter M Martinique, MD;  Location: [facility] CATH LAB;   Service: Cardiovascular;  Laterality: N/A;   LOWER EXTREMITY ANGIOGRAM N/A 01/27/2011   Procedure: LOWER EXTREMITY ANGIOGRAM;  Surgeon: Rosetta Posner, MD;  Location[facility] C[facility] LAB;  Service: Cardiovascular;  Laterality: N/A;   LOWER EXTREMITY ANGIOGRAM N/A 01/28/2011   Procedure: LOWER EXTREMITY ANGIOGRAM;  Surgeon:  Conrad La Rosita, MD;  Location: [facility] CATH LAB;  Service: Cardiovascular;  Laterality: N/A;   LOWER EXTREMITY ANGIOGRAM Left 01/29/2011   Procedure: LOWER EXTREMITY ANGIOGRAM;  Surgeon: Mal Misty, MD;  Location: [facility] CATH LAB;  Service: Cardiovascular;  Laterality: Left;   MULTIPLE TOOTH EXTRACTIONS     TEE WITHOUT CARDIOVERSION  04/15/2011   Procedure: TRANSESOPHAGEAL ECHOCARDIOGRAM (TEE);  Surgeon: [name], MD;  [facility];  Service: Cardiovascular;  Laterality: N/A;   WRIST SURGERY     Left    Past Family History   Family History  Problem Relation Age of Onset   COPD Mother    Hypertension Mother    Diabetes Mother    Stroke Mother    Coronary artery disease Mother    Diabetes Father    Hypertension Father    Coronary artery disease Father    Kidney disease Father        on dialysis, died from heart attack while being dialzed   Pancreatitis Maternal Aunt    Cancer Maternal Aunt        unknown primary   Anesthesia problems Neg Hx    Colon cancer Neg Hx     Past Social History   Social History   Socioeconomic History   Marital status: Single    Spouse name: Not on file   Number of children: Not on file   Years of education: Not on file   Highest education level: Not on file  Occupational History   Not on file  Tobacco Use   Smoking status: Every Day    Packs/day: 0.50    Years: 21.00    Total pack years: 10.50    Types: Cigarettes    Start date: 07/06/1989   Smokeless tobacco: Never  Vaping Use   Vaping Use: Never used  Substance and Sexual Activity   Alcohol use: No    Alcohol/week: 0.0 standard drinks of alcohol    Comment: rarely    Drug use: Not Currently    Frequency: 1.0 times per week    Types: Marijuana   Sexual activity: Yes    Partners: Male    Comment: pt stated that her tubal was only 50% effective- "not cut and burned"  Other Topics Concern   Not on file  Social History Narrative   Unemployed.  Lives in [place] with [place], [place], and mother.  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   Social Determinants of Health   Financial Resource Strain: Not on file  Food Insecurity: Not on file  Transportation Needs: Not on file  Physical Activity: Not on file  Stress: Not on file  Social Connections: Not on file  Intimate Partner Violence: Not on file    Review of Systems   General: Negative for anorexia, recent weight loss, fever, chills, fatigue, weakness. ENT: Negative for hoarseness, difficulty swallowing , nasal congestion. CV: Negative for chest pain, angina, palpitations, dyspnea on exertion, peripheral edema.  Respiratory: Negative for dyspnea at rest, dyspnea on exertion, cough, sputum, wheezing.  GI: See history of present illness. GU:  Negative for dysuria, hematuria, urinary incontinence, urinary frequency, nocturnal urination.  Endo: Negative for unusual weight change.     Physical Exam   BP (!) 173/77 (BP Location: Left Arm, Patient Position: Sitting, Cuff Size: Normal)   Pulse 76   Temp 98.2 F (36.8 C) (Oral)   Ht '5\' 1"'$  (1.549 m)   Wt 102 lb 12.8 oz (46.6 kg)  LMP 12/28/2010   SpO2 99%   BMI 19.42 kg/m    General: chronically ill appearing female in no acute distress. Appears older than states age. Tearful.   Eyes: No icterus. Mouth: Oropharyngeal mucosa moist and pink , no lesions erythema or exudate. Lungs: Clear to auscultation bilaterally.  Heart: Regular rate and rhythm, no murmurs rubs or gallops.  Abdomen: Bowel sounds are normal,nondistended, no hepatosplenomegaly or masses,  no abdominal bruits or hernia , no rebound or guarding. Tender in the right mid  abdomen, full but no mass.  Rectal: not performed  Extremities: No lower extremity edema. No clubbing or deformities. Neuro: Alert and oriented x 4   Skin: Warm and dry, no jaundice.   Psych: Alert and cooperative, normal mood and affect.  Labs   Lab Results  Component Value Date   CREATININE 2.82 (H) 07/14/2021   BUN 42 (H) 07/14/2021   NA 137 07/14/2021   K 5.1 07/14/2021   CL 101 07/14/2021   CO2 22 07/14/2021   Lab Results  Component Value Date   WBC 8.2 10/05/2021   HGB 12.3 10/05/2021   HCT 36.9 10/05/2021   MCV 93.9 10/05/2021   PLT 246 10/05/2021   Lab Results  Component Value Date   INR 0.9 10/05/2021   INR 0.87 10/04/2012   INR 1.23 09/30/2012    Imaging Studies   No results found.  Assessment   GERD: typical heartburn doing ok. She has been having some epigastric pain/RUQ pain which she takes TUMS/Pepto with some relief. EGD up to date.   Constipation: some improvement on Linzess but would recommend increasing dose to better control symptoms.   Right sided abdominal pain: chronic for several years. Intermittent elevation of AST/ALT. H/o idiopathic pancreatitis in 2018. CT, ultrasound, MRCP at that time unremarkable. Recent U/S as outline above with normal gallbladder. Recent EGD without explanation for her abdominal pain. Recent positive Cologuard, needs further investigation. Based on findings of colonoscopy, she may need CT A/P.   Vomiting: denies nausea, abdominal pain preceding episodes of vomiting which occur about twice per week. Likely multifactorial. She likely has some underlying gastroparesis in setting of chronically poorly controlled diabetes. Anxiety likely playing a role as well. Fortunately symptoms are less than before but she continues to have poor appetite. Proceed with colonoscopy. If negative, would consider CT A/P.   Positive Cologuard: colonoscopy planned.   PLAN   Refilled her zofran RX. Increase Linzess to 129mg daily before  breakfast.  Continue omeprazole '20mg'$  daily. Colonoscopy in the near future. ASA 3.  I have discussed the risks, alternatives, benefits with regards to but not limited to the risk of reaction to medication, bleeding, infection, perforation and the patient is agreeable to proceed. Written consent to be obtained. Will hold Eliquis 24 hours prior to colonoscopy based on previous recommendations back in 05/2021 due to her history of clotting disorder.   LLaureen [facility]chs [facility]

## 2021-11-09 NOTE — Telephone Encounter (Signed)
[name], patient's recent labs show creatinine clearance in the low 20s. She needs Trilyte prep due to kidney disease. Also will need tap water enema instead of fleet's enema.   Please give her new prep instructions. She cannot have Clenpiq given lab results.

## 2021-11-09 NOTE — Patient Instructions (Signed)
Increase Linzess to 149mg daily before breakfast. Goal of having bowel movement 4-5 times per week. Let me know if not effective. Continue omeprazole 20 mg daily before breakfast. Prescription for Zofran sent to your pharmacy to use as needed for nausea and vomiting. Colonoscopy to be scheduled.  You will need to hold Eliquis 24 hours prior to procedure.

## 2021-11-10 ENCOUNTER — Encounter: Payer: Self-pay | Admitting: *Deleted

## 2021-11-10 MED ORDER — PEG 3350-KCL-NA BICARB-NACL 420 G PO SOLR: 4000.0000 mL | Freq: Once | ORAL | 0 refills | Status: AC

## 2021-11-10 NOTE — Telephone Encounter (Signed)
Noted. Will change prep to trilyte. New instructions will be mailed to pt

## 2021-11-10 NOTE — Telephone Encounter (Signed)
Pt's daughter informed of change in prep and new instructions mailed

## 2021-11-12 ENCOUNTER — Ambulatory Visit: Payer: Medicaid Other | Admitting: Gastroenterology

## 2021-11-17 ENCOUNTER — Other Ambulatory Visit (HOSPITAL_COMMUNITY): Payer: Self-pay | Admitting: Nephrology

## 2021-11-17 DIAGNOSIS — N170 Acute kidney failure with tubular necrosis: Secondary | ICD-10-CM

## 2021-11-17 DIAGNOSIS — I5042 Chronic combined systolic (congestive) and diastolic (congestive) heart failure: Secondary | ICD-10-CM

## 2021-11-17 DIAGNOSIS — E1122 Type 2 diabetes mellitus with diabetic chronic kidney disease: Secondary | ICD-10-CM

## 2021-11-17 DIAGNOSIS — R809 Proteinuria, unspecified: Secondary | ICD-10-CM

## 2021-12-01 ENCOUNTER — Other Ambulatory Visit: Payer: Self-pay | Admitting: Gastroenterology

## 2021-12-01 ENCOUNTER — Other Ambulatory Visit: Payer: Self-pay | Admitting: Student

## 2021-12-01 DIAGNOSIS — R809 Proteinuria, unspecified: Secondary | ICD-10-CM

## 2021-12-02 ENCOUNTER — Other Ambulatory Visit: Payer: Self-pay | Admitting: Radiology

## 2021-12-02 ENCOUNTER — Encounter: Payer: Self-pay | Admitting: Nurse Practitioner

## 2021-12-02 ENCOUNTER — Other Ambulatory Visit: Payer: Self-pay | Admitting: Student

## 2021-12-02 DIAGNOSIS — R809 Proteinuria, unspecified: Secondary | ICD-10-CM

## 2021-12-02 MED ORDER — ACCU-CHEK GUIDE VI STRP: ORAL_STRIP | 12 refills | Status: DC

## 2021-12-02 NOTE — H&P (Signed)
Chief Complaint: Nephrotic range proteinuria   Referring Physician(s): [employer]   Supervising Physician: Michaelle Birks  Patient Status: [facility] - Out-pt  History of Present Illness: [name] is a 51 y.o. female with PMHs of MI, HLD, CVA in 2018, DM, PAD on Eliquis, and CKD stage IV.   It appears she had normal renal function until November 2018.  In July 2020 she was found to have a creatinine of 1.63.    Her renal function was stable until June 2023 when she showed worsening of renal function with BUN of 42, creatinine 2.82, and GFR 20.   UA has been showing proteinuria since January 2021.  She is here today for random renal biopsy. She was scheduled previously on 10/05/21 however her blood sugar was too elevated to proceed.  She is NPO. No nausea/vomiting. No Fever/chills. ROS negative.  Past Medical History:  Diagnosis Date   Acquired absence of left leg below knee ([place])    Anxiety    was on xanax for years but discontinued prior to prior PCP retiring as she was on opioids and was told she would not find someone to write for both   Arterial thromboembolism ([place])    Prior embolectomy, previously on Coumadin   Cataract    Phreesia 11/19/2019   Chronic kidney disease    Phreesia 11/19/2019   Clotting disorder ([place])    Phreesia 11/19/2019   Coronary atherosclerosis of native coronary artery    a. s/p DES to LAD in 2014   Depression    Depression    Phreesia 11/19/2019   Diabetes mellitus without complication ([place])    Phreesia 11/19/2019   Diabetic ketoacidosis with coma associated with type 1 diabetes mellitus ([place])    DKA, type 1 ([place]) 11/17/2016   Facial cellulitis    12/2010   Glomerulonephritis    Headache(784.0)    Hemiplegia, unspecified affecting right dominant side ([place])    History of stroke    Hyperlipidemia    Insulin dependent diabetes mellitus    History of diabetic ketoacidosis   Ischemic cardiomyopathy    a. EF 30-35% in 2018 b.  EF at 45-50% by repeat imaging in 02/2019   Left carotid artery occlusion    Major depressive disorder, single episode, unspecified    Pancreatitis, acute 11/07/2016   Peripheral arterial disease ([place])    Shingles    11/2010   ST elevation myocardial infarction (STEMI) of anterior wall [facility])    Late presentation September 2014   Stroke [facility])    2018   Upper abdominal pain 06/03/2021    Past Surgical History:  Procedure Laterality Date   AMPUTATION  03/03/2011   Procedure: AMPUTATION DIGIT;  Surgeon: Newt Minion, MD;  Location: [place];  Service: Orthopedics;  Laterality: Left;  Left Foot Amputation 4th and 5th toes at MTP joint   AMPUTATION  04/22/2011   Procedure: AMPUTATION BELOW KNEE;  Surgeon: Newt Minion, MD;  Location: [place];  Service: Orthopedics;  Laterality: Left;  Left Below Knee Amputation   BIOPSY  07/17/2021   Procedure: BIOPSY;  Surgeon: Eloise Harman, DO;  [facility];  Service: Endoscopy;;   CATARACT EXTRACTION W/PHACO Left 01/09/2020   Procedure: CATARACT EXTRACTION PHACO AND INTRAOCULAR LENS PLACEMENT (IOC);  Surgeon: Baruch Goldmann, MD;  Location: [facility] OR[facility];  Service: Ophthalmology;  Laterality: Left;  CDE: 9.73   DILATION AND CURETTAGE OF UTERUS     EMBOLECTOMY  01/29/2011  Procedure: EMBOLECTOMY;  Surgeon: Mal Misty, MD;  Location: [facility] OR;  Service: Vascular;  Laterality: Left;  Left Popliteal and Tibial Embolectomy with patch angioplasty   ESOPHAGOGASTRODUODENOSCOPY (EGD) WITH PROPOFOL N/A 07/17/2021   Procedure: ESOPHAGOGASTRODUODENOSCOPY (EGD) WITH PROPOFOL;  Surgeon: Eloise Harman, DO;  [facility];  Service: Endoscopy;  Laterality: N/A;  1:00pm   INCISION AND DRAINAGE ABSCESS N/A 04/06/2016   Procedure: INCISION AND DRAINAGE ABSCESS;  Surgeon: Jonnie Kind, MD;  Location: [facility];  Service: Gynecology;  Laterality: N/A;   LEFT HEART CATHETERIZATION WITH CORONARY ANGIOGRAM N/A 10/01/2012   Procedure: LEFT HEART CATHETERIZATION WITH  CORONARY ANGIOGRAM;  Surgeon: Peter M Martinique, MD;  Location: [facility] CATH LAB;  Service: Cardiovascular;  Laterality: N/A;   LOWER EXTREMITY ANGIOGRAM N/A 01/27/2011   Procedure: LOWER EXTREMITY ANGIOGRAM;  Surgeon: Rosetta Posner, MD;  Location: [facility] CATH LAB;  Service: Cardiovascular;  Laterality: N/A;   LOWER EXTREMITY ANGIOGRAM N/A 01/28/2011   Procedure: LOWER EXTREMITY ANGIOGRAM;  Surgeon: Conrad Westland, MD;  Location: [facility] CATH LAB;  Service: Cardiovascular;  Laterality: N/A;   LOWER EXTREMITY ANGIOGRAM Left 01/29/2011   Procedure: LOWER EXTREMITY ANGIOGRAM;  Surgeon: Mal Misty, MD;  Location: [facility] CATH LAB;  Service: Cardiovascular;  Laterality: Left;   MULTIPLE TOOTH EXTRACTIONS     TEE WITHOUT CARDIOVERSION  04/15/2011   Procedure: TRANSESOPHAGEAL ECHOCARDIOGRAM (TEE);  Surgeon: [name], MD;  [facility];  Service: Cardiovascular;  Laterality: N/A;   WRIST SURGERY     Left    Allergies: Losartan, Mirtazapine, and Nsaids  Medications: Prior to Admission medications   Medication Sig Start Date End Date Taking? Authorizing Provider  acetaminophen (TYLENOL) 500 MG tablet Take 500 mg by mouth every 6 (six) hours as needed for moderate pain.   Yes [provider]  apixaban (ELIQUIS) 2.5 MG TABS tablet Take 1 tablet (2.5 mg total) by mouth 2 (two) times daily. 11/06/20  Yes Strader, [place], PA-C  atorvastatin (LIPITOR) 80 MG tablet Take 1 tablet (80 mg total) by mouth daily. Patient taking differently: Take 40 mg by mouth 2 (two) times daily. 11/06/20  Yes Strader, [place] M, PA-C  calcium carbonate (TUMS EX) 750 MG chewable tablet Chew 1 tablet by mouth daily as needed for heartburn.   Yes [provider]  carvedilol (COREG) 6.25 MG tablet Take 1 tablet (6.25 mg total) by mouth 2 (two) times daily with a meal. 11/06/20  Yes Strader, [place] M, PA-C  cetirizine (ZYRTEC) 10 MG tablet Take 10 mg by mouth daily as needed for allergies.   Yes [provider]  hydrALAZINE (APRESOLINE) 25 MG tablet Take 0.5 tablets (12.5 mg total) by mouth 2 (two) times daily. 12/22/20  Yes Strader, [place], PA-C  insulin aspart (NOVOLOG) 100 UNIT/ML FlexPen Inject 2-8 Units into the skin 3 (three) times daily with meals. Patient taking differently: Inject 1-7 Units into the skin 3 (three) times daily with meals. 04/08/21  Yes Brita Romp, NP  linaclotide [facility]) 145 MCG CAPS capsule Take 1 capsule (145 mcg total) by mouth daily before breakfast. 11/09/21  Yes Mahala Menghini, PA-C  nitroGLYCERIN (NITROSTAT) 0.4 MG SL tablet Place 1 tablet (0.4 mg total) under the tongue every 5 (five) minutes as needed for chest pain. 11/06/20  Yes Strader, [place] M, PA-C  omeprazole (PRILOSEC) 20 MG capsule Take 1 capsule (20 mg total) by mouth daily. 05/27/21  Yes Alvira Monday, FNP  oxyCODONE-acetaminophen (PERCOCET) [phone]  MG tablet Take 0.5 tablets every 4 (four) hours as needed by mouth for pain. Patient taking differently: Take 1 tablet by mouth every 4 (four) hours as needed for pain. 11/24/16  Yes Regalado, [place] A, MD  simethicone (GAS-X) 80 MG chewable tablet Chew 80 mg by mouth every 6 (six) hours as needed for flatulence.   Yes [provider]  TRESIBA FLEXTOUCH 100 UNIT/ML FlexTouch Pen Inject 10 Units into the skin at bedtime. 08/04/21  Yes [provider]  glucose blood (ACCU-CHEK GUIDE) test strip Use as instructed to monitor glucose 4 times daily, before meals and before bed. 10/29/20   Brita Romp, NP  Insulin Pen Needle (UNIFINE PENTIPS) 31G X 8 MM MISC USE TO INJECT INSULIN SUBCUTANEOUSLY . USE FOR BOTH SLIDING SCALE AND FOR DAILYINSULIN DOSES. 07/24/20   Brita Romp, NP  mirtazapine (REMERON) 15 MG tablet Take 1 tablet (15 mg total) by mouth at bedtime. Patient not taking: Reported on 12/01/2021 08/04/21   Lindell Spar, MD  ondansetron (ZOFRAN) 4 MG tablet TAKE ONE TABLET BY MOUTH (4MG) EVERY 4 TO 6 HOURS AS  NEEDED FOR NAUSEA OR VOMITING 12/01/21   Mahala Menghini, PA-C  Sod Picosulfate-Mag Ox-Cit Acd (CLENPIQ) 10-3.5-12 MG-GM -GM/175ML SOLN Take 1 kit by mouth as directed. 11/09/21   Eloise Harman, DO     Family History  Problem Relation Age of Onset   COPD Mother    Hypertension Mother    Diabetes Mother    Stroke Mother    Coronary artery disease Mother    Diabetes Father    Hypertension Father    Coronary artery disease Father    Kidney disease Father        on dialysis, died from heart attack while being dialzed   Pancreatitis Maternal Aunt    Cancer Maternal Aunt        unknown primary   Anesthesia problems Neg Hx    Colon cancer Neg Hx     Social History   Socioeconomic History   Marital status: Single    Spouse name: Not on file   Number of children: Not on file   Years of education: Not on file   Highest education level: Not on file  Occupational History   Not on file  Tobacco Use   Smoking status: Every Day    Packs/day: 0.50    Years: 21.00    Total pack years: 10.50    Types: Cigarettes    Start date: 07/06/1989   Smokeless tobacco: Never  Vaping Use   Vaping Use: Never used  Substance and Sexual Activity   Alcohol use: No    Alcohol/week: 0.0 standard drinks of alcohol    Comment: rarely   Drug use: Not Currently    Frequency: 1.0 times per week    Types: Marijuana   Sexual activity: Yes    Partners: Male    Comment: pt stated that her tubal was only 50% effective- "not cut and burned"  Other Topics Concern   Not on file  Social History Narrative   Unemployed.  Lives in [place] with [place], [place], and mother.  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   Social Determinants of Health   Financial Resource Strain: Not on file  Food Insecurity: Not on file  Transportation Needs: Not on file  Physical Activity: Not on file  Stress: Not on file  Social Connections: Not on file     Review of Systems:  A 12 point ROS discussed and  pertinent positives are indicated in the HPI above.  All other systems are negative.  Review of Systems  Vital Signs: LMP 12/28/2010   Physical Exam Vitals reviewed.  Constitutional:      Appearance: Normal appearance.  HENT:     Head: Normocephalic and atraumatic.  Eyes:     Extraocular Movements: Extraocular movements intact.  Cardiovascular:     Rate and Rhythm: Normal rate and regular rhythm.  Pulmonary:     Effort: Pulmonary effort is normal. No respiratory distress.     Breath sounds: Normal breath sounds.  Abdominal:     Palpations: Abdomen is soft.  Musculoskeletal:        General: Normal range of motion.     Cervical back: Normal range of motion.  Skin:    General: Skin is warm and dry.  Neurological:     General: No focal deficit present.     Mental Status: She is alert and oriented to person, place, and time.  Psychiatric:        Mood and Affect: Mood normal.        Behavior: Behavior normal.        Thought Content: Thought content normal.        Judgment: Judgment normal.     Imaging: No results found.  Labs:  CBC: Recent Labs    10/05/21 0630  WBC 8.2  HGB 12.3  HCT 36.9  PLT 246    COAGS: Recent Labs    10/05/21 0630  INR 0.9    BMP: Recent Labs    04/09/21 1227 07/14/21 1000  NA 147* 137  K 5.4* 5.1  CL 106 101  CO2 26 22  GLUCOSE 88 179*  BUN 33* 42*  CALCIUM 9.1 9.0  CREATININE 2.22* 2.82*  GFRNONAA  --  20*    LIVER FUNCTION TESTS: Recent Labs    04/09/21 1227 05/01/21 1100  BILITOT 0.3 0.3  AST 66* 14  ALT 68* 19  ALKPHOS 110 100  PROT 6.5 6.1  ALBUMIN 3.7* 3.6*    TUMOR MARKERS: No results for input(s): "AFPTM", "CEA", "CA199", "CHROMGRNA" in the last 8760 hours.  Assessment and Plan:  Nephrotic range proteinuria  Will proceed with image guided random renal biopsy today by Dr. Maryelizabeth Kaufmann.  Risks and benefits of renal biopsy was discussed with the patient and/or patient's family including, but not  limited to bleeding, infection, damage to adjacent structures or low yield requiring additional tests.  All of the questions were answered and there is agreement to proceed.  Consent signed and in chart.   Thank you for allowing our service to participate in [name] 's care.  Electronically Signed: Murrell Redden, PA-C   12/02/2021, 3:21 PM      I spent a total of    25 Minutes in face to face in clinical consultation, greater than 50% of which was counseling/coordinating care for random renal biopsy.

## 2021-12-02 NOTE — Telephone Encounter (Signed)
I have sent in the refill for strips, can you set up a follow up for her?

## 2021-12-03 ENCOUNTER — Encounter (HOSPITAL_COMMUNITY): Payer: Self-pay

## 2021-12-03 ENCOUNTER — Ambulatory Visit (HOSPITAL_COMMUNITY)
Admission: RE | Admit: 2021-12-03 | Discharge: 2021-12-03 | Disposition: A | Discharge status: Home / Self Care | Payer: Medicaid Other | Source: Ambulatory Visit | Attending: Nephrology | Admitting: Nephrology

## 2021-12-03 DIAGNOSIS — R809 Proteinuria, unspecified: Secondary | ICD-10-CM | POA: Insufficient documentation

## 2021-12-03 DIAGNOSIS — I5042 Chronic combined systolic (congestive) and diastolic (congestive) heart failure: Secondary | ICD-10-CM | POA: Insufficient documentation

## 2021-12-03 DIAGNOSIS — Z8673 Personal history of transient ischemic attack (TIA), and cerebral infarction without residual deficits: Secondary | ICD-10-CM | POA: Diagnosis not present

## 2021-12-03 DIAGNOSIS — I739 Peripheral vascular disease, unspecified: Secondary | ICD-10-CM | POA: Diagnosis not present

## 2021-12-03 DIAGNOSIS — N170 Acute kidney failure with tubular necrosis: Secondary | ICD-10-CM | POA: Insufficient documentation

## 2021-12-03 DIAGNOSIS — F17210 Nicotine dependence, cigarettes, uncomplicated: Secondary | ICD-10-CM | POA: Diagnosis not present

## 2021-12-03 DIAGNOSIS — N184 Chronic kidney disease, stage 4 (severe): Secondary | ICD-10-CM | POA: Insufficient documentation

## 2021-12-03 DIAGNOSIS — I252 Old myocardial infarction: Secondary | ICD-10-CM | POA: Insufficient documentation

## 2021-12-03 DIAGNOSIS — E1122 Type 2 diabetes mellitus with diabetic chronic kidney disease: Secondary | ICD-10-CM | POA: Insufficient documentation

## 2021-12-03 DIAGNOSIS — Z7901 Long term (current) use of anticoagulants: Secondary | ICD-10-CM | POA: Insufficient documentation

## 2021-12-03 LAB — PROTIME-INR
INR: 1 (ref 0.8–1.2)
Prothrombin Time: 13.1 seconds (ref 11.4–15.2)

## 2021-12-03 LAB — CBC
HCT: 37.1 % (ref 36.0–46.0)
Hemoglobin: 12 g/dL (ref 12.0–15.0)
MCH: 30.5 pg (ref 26.0–34.0)
MCHC: 32.3 g/dL (ref 30.0–36.0)
MCV: 94.4 fL (ref 80.0–100.0)
Platelets: 240 10*3/uL (ref 150–400)
RBC: 3.93 MIL/uL (ref 3.87–5.11)
RDW: 12.8 % (ref 11.5–15.5)
WBC: 7.6 10*3/uL (ref 4.0–10.5)
nRBC: 0 % (ref 0.0–0.2)

## 2021-12-03 LAB — GLUCOSE, CAPILLARY
Glucose-Capillary: 184 mg/dL — ABNORMAL HIGH (ref 70–99)
Glucose-Capillary: 223 mg/dL — ABNORMAL HIGH (ref 70–99)

## 2021-12-03 MED ORDER — GELATIN ABSORBABLE 12-7 MM EX MISC: 1.0000 | Freq: Once | CUTANEOUS | Status: DC

## 2021-12-03 MED ORDER — SODIUM CHLORIDE 0.9 % IV SOLN: INTRAVENOUS | Status: DC

## 2021-12-03 MED ORDER — LIDOCAINE HCL (PF) 1 % IJ SOLN
INTRAMUSCULAR | Status: AC
  Filled 2021-12-03: qty 30

## 2021-12-03 MED ORDER — FENTANYL CITRATE (PF) 100 MCG/2ML IJ SOLN
INTRAMUSCULAR | Status: AC | PRN
  Administered 2021-12-03: 50 ug via INTRAVENOUS
  Administered 2021-12-03 (×2): 25 ug via INTRAVENOUS

## 2021-12-03 MED ORDER — GELATIN ABSORBABLE 12-7 MM EX MISC
CUTANEOUS | Status: AC
  Filled 2021-12-03: qty 1

## 2021-12-03 MED ORDER — MIDAZOLAM HCL 2 MG/2ML IJ SOLN
INTRAMUSCULAR | Status: AC | PRN
  Administered 2021-12-03 (×2): 1 mg via INTRAVENOUS

## 2021-12-03 MED ORDER — FENTANYL CITRATE (PF) 100 MCG/2ML IJ SOLN
INTRAMUSCULAR | Status: AC
  Filled 2021-12-03: qty 2

## 2021-12-03 MED ORDER — HYDRALAZINE HCL 25 MG PO TABS
12.5000 mg | ORAL_TABLET | Freq: Once | ORAL | Status: AC
  Administered 2021-12-03: 12.5 mg via ORAL
  Filled 2021-12-03: qty 0.5

## 2021-12-03 MED ORDER — LIDOCAINE HCL (PF) 1 % IJ SOLN: 10.0000 mL | Freq: Once | INTRAMUSCULAR | Status: DC

## 2021-12-03 MED ORDER — MIDAZOLAM HCL 2 MG/2ML IJ SOLN
INTRAMUSCULAR | Status: AC
  Filled 2021-12-03: qty 2

## 2021-12-03 NOTE — Procedures (Signed)
Vascular and Interventional Radiology Procedure Note  Patient: [name] DOB: 1970-12-17 Medical Record Number: [id number] Note Date/Time: 12/03/21 9:02 AM   Performing Physician: Michaelle Birks, MD Assistant(s): None  Diagnosis: CKD. Glomerulonephritis.   Procedure: RENAL BIOPSY, non-targeted  Anesthesia: Conscious Sedation Complications: None Estimated Blood Loss: Minimal Specimens: Sent for Pathology  Findings:  Successful Ultrasound-guided biopsy of kidney, left non-targeted. A total of 2 samples were obtained. Hemostasis of the tract was achieved using Gelfoam Slurry Embolization.  Plan: Bed rest for 2 hours.  See detailed procedure note with images in PACS. The patient tolerated the procedure well without incident or complication and was returned to Recovery in stable condition.    Michaelle Birks, MD Vascular and Interventional Radiology [facility] Radiology   Pager. [place]

## 2021-12-04 NOTE — Patient Instructions (Signed)
[name]  12/04/2021     '@PREFPERIOPPHARMACY'$ @   Your procedure is scheduled on  12/14/2021.   Report to [facility] at  [facility]   Call this number if you have problems the morning of surgery:  [phone]  If you experience any cold or flu symptoms such as cough, fever, chills, shortness of breath, etc. between now and your scheduled surgery, please notify us at the above number.   Remember:  Follow the diet and prep instructions given to you by the office.     Your last dose of eliquis should be on 12/12/2021.     Take 5 units of tresiba the night before your procedure.     DO NOT take any medications for diabetes the morning of your procedure.     Take these medicines the morning of surgery with A SIP OF WATER       omeprazole, zofran (if needed), oxycodone(if needed), carvedilol, zyrtec.    Do not wear jewelry, make-up or nail polish.  Do not wear lotions, powders, or perfumes, or deodorant.  Do not shave 48 hours prior to surgery.  Men may shave face and neck.  Do not bring valuables to the hospital.  [facility] is not responsible for any belongings or valuables.  Contacts, dentures or bridgework may not be worn into surgery.  Leave your suitcase in the car.  After surgery it may be brought to your room.  For patients admitted to the hospital, discharge time will be determined by your treatment team.  Patients discharged the day of surgery will not be allowed to drive home and must have someone with them for 24 hours.    Special instructions:   DO NOT smoke tobacco or vape for 24 hours before your procedure.  Please read over the following fact sheets that you were given. Anesthesia Post-op Instructions and Care and Recovery After Surgery      Colonoscopy, Adult, Care After The following information offers guidance on how to care for yourself after your procedure. Your health care provider may also give you more specific instructions. If you  have problems or questions, contact your health care provider. What can I expect after the procedure? After the procedure, it is common to have: A small amount of blood in your stool for 24 hours after the procedure. Some gas. Mild cramping or bloating of your abdomen. Follow these instructions at home: Eating and drinking  Drink enough fluid to keep your urine pale yellow. Follow instructions from your health care provider about eating or drinking restrictions. Resume your normal diet as told by your health care provider. Avoid heavy or fried foods that are hard to digest. Activity Rest as told by your health care provider. Avoid sitting for a long time without moving. Get up to take short walks every 1-2 hours. This is important to improve blood flow and breathing. Ask for help if you feel weak or unsteady. Return to your normal activities as told by your health care provider. Ask your health care provider what activities are safe for you. Managing cramping and bloating  Try walking around when you have cramps or feel bloated. If directed, apply heat to your abdomen as told by your health care provider. Use the heat source that your health care provider recommends, such as a moist heat pack or a heating pad. Place a towel between your skin and the heat source. Leave the heat on for 20-30  minutes. Remove the heat if your skin turns bright red. This is especially important if you are unable to feel pain, heat, or cold. You have a greater risk of getting burned. General instructions If you were given a sedative during the procedure, it can affect you for several hours. Do not drive or operate machinery until your health care provider says that it is safe. For the first 24 hours after the procedure: Do not sign important documents. Do not drink alcohol. Do your regular daily activities at a slower pace than normal. Eat soft foods that are easy to digest. Take over-the-counter and  prescription medicines only as told by your health care provider. Keep all follow-up visits. This is important. Contact a health care provider if: You have blood in your stool 2-3 days after the procedure. Get help right away if: You have more than a small spotting of blood in your stool. You have large blood clots in your stool. You have swelling of your abdomen. You have nausea or vomiting. You have a fever. You have increasing pain in your abdomen that is not relieved with medicine. These symptoms may be an emergency. Get help right away. Call 911. Do not wait to see if the symptoms will go away. Do not drive yourself to the hospital. Summary After the procedure, it is common to have a small amount of blood in your stool. You may also have mild cramping and bloating of your abdomen. If you were given a sedative during the procedure, it can affect you for several hours. Do not drive or operate machinery until your health care provider says that it is safe. Get help right away if you have a lot of blood in your stool, nausea or vomiting, a fever, or increased pain in your abdomen. This information is not intended to replace advice given to you by your health care provider. Make sure you discuss any questions you have with your health care provider. Document Revised: 08/27/2020 Document Reviewed: 08/27/2020 Elsevier Patient Education  [place] After The following information offers guidance on how to care for yourself after your procedure. Your health care provider may also give you more specific instructions. If you have problems or questions, contact your health care provider. What can I expect after the procedure? After the procedure, it is common to have: Tiredness. Little or no memory about what happened during or after the procedure. Impaired judgment when it comes to making decisions. Nausea or vomiting. Some trouble with balance. Follow  these instructions at home: For the time period you were told by your health care provider:  Rest. Do not participate in activities where you could fall or become injured. Do not drive or use machinery. Do not drink alcohol. Do not take sleeping pills or medicines that cause drowsiness. Do not make important decisions or sign legal documents. Do not take care of children on your own. Medicines Take over-the-counter and prescription medicines only as told by your health care provider. If you were prescribed antibiotics, take them as told by your health care provider. Do not stop using the antibiotic even if you start to feel better. Eating and drinking Follow instructions from your health care provider about what you may eat and drink. Drink enough fluid to keep your urine pale yellow. If you vomit: Drink clear fluids slowly and in small amounts as you are able. Clear fluids include water, ice chips, low-calorie sports drinks, and fruit juice that has  water added to it (diluted fruit juice). Eat light and bland foods in small amounts as you are able. These foods include bananas, applesauce, rice, lean meats, toast, and crackers. General instructions  Have a responsible adult stay with you for the time you are told. It is important to have someone help care for you until you are awake and alert. If you have sleep apnea, surgery and some medicines can increase your risk for breathing problems. Follow instructions from your health care provider about wearing your sleep device: When you are sleeping. This includes during daytime naps. While taking prescription pain medicines, sleeping medicines, or medicines that make you drowsy. Do not use any products that contain nicotine or tobacco. These products include cigarettes, chewing tobacco, and vaping devices, such as e-cigarettes. If you need help quitting, ask your health care provider. Contact a health care provider if: You feel nauseous or  vomit every time you eat or drink. You feel light-headed. You are still sleepy or having trouble with balance after 24 hours. You get a rash. You have a fever. You have redness or swelling around the IV site. Get help right away if: You have trouble breathing. You have new confusion after you get home. These symptoms may be an emergency. Get help right away. Call 911. Do not wait to see if the symptoms will go away. Do not drive yourself to the hospital. This information is not intended to replace advice given to you by your health care provider. Make sure you discuss any questions you have with your health care provider. Document Revised: 06/01/2021 Document Reviewed: 06/01/2021 Elsevier Patient Education  [place].

## 2021-12-08 ENCOUNTER — Encounter (HOSPITAL_COMMUNITY)
Admission: RE | Admit: 2021-12-08 | Discharge: 2021-12-08 | Disposition: A | Discharge status: Home / Self Care | Payer: Medicaid Other | Source: Ambulatory Visit | Attending: Internal Medicine | Admitting: Internal Medicine

## 2021-12-08 ENCOUNTER — Encounter (HOSPITAL_COMMUNITY): Payer: Self-pay

## 2021-12-08 ENCOUNTER — Encounter: Payer: Medicaid Other | Admitting: Internal Medicine

## 2021-12-08 DIAGNOSIS — E1059 Type 1 diabetes mellitus with other circulatory complications: Secondary | ICD-10-CM | POA: Insufficient documentation

## 2021-12-08 LAB — BASIC METABOLIC PANEL
Anion gap: 4 — ABNORMAL LOW (ref 5–15)
BUN: 38 mg/dL — ABNORMAL HIGH (ref 6–20)
CO2: 27 mmol/L (ref 22–32)
Calcium: 8.2 mg/dL — ABNORMAL LOW (ref 8.9–10.3)
Chloride: 106 mmol/L (ref 98–111)
Creatinine, Ser: 2.7 mg/dL — ABNORMAL HIGH (ref 0.44–1.00)
GFR, Estimated: 21 mL/min — ABNORMAL LOW (ref >60)
Glucose, Bld: 172 mg/dL — ABNORMAL HIGH (ref 70–99)
Potassium: 5.4 mmol/L — ABNORMAL HIGH (ref 3.5–5.1)
Sodium: 137 mmol/L (ref 135–145)

## 2021-12-14 ENCOUNTER — Encounter (HOSPITAL_COMMUNITY)
Admission: RE | Disposition: A | Discharge status: Home / Self Care | Payer: Self-pay | Source: Home / Self Care | Attending: Internal Medicine

## 2021-12-14 ENCOUNTER — Ambulatory Visit (HOSPITAL_COMMUNITY): Payer: Medicaid Other | Admitting: Anesthesiology

## 2021-12-14 ENCOUNTER — Ambulatory Visit (HOSPITAL_BASED_OUTPATIENT_CLINIC_OR_DEPARTMENT_OTHER): Payer: Medicaid Other | Admitting: Anesthesiology

## 2021-12-14 ENCOUNTER — Encounter (HOSPITAL_COMMUNITY): Payer: Self-pay

## 2021-12-14 ENCOUNTER — Ambulatory Visit (HOSPITAL_COMMUNITY)
Admission: RE | Admit: 2021-12-14 | Discharge: 2021-12-14 | Disposition: A | Discharge status: Home / Self Care | Payer: Medicaid Other | Attending: Internal Medicine | Admitting: Internal Medicine

## 2021-12-14 DIAGNOSIS — K648 Other hemorrhoids: Secondary | ICD-10-CM

## 2021-12-14 DIAGNOSIS — F1120 Opioid dependence, uncomplicated: Secondary | ICD-10-CM | POA: Insufficient documentation

## 2021-12-14 DIAGNOSIS — D125 Benign neoplasm of sigmoid colon: Secondary | ICD-10-CM | POA: Insufficient documentation

## 2021-12-14 DIAGNOSIS — I2510 Atherosclerotic heart disease of native coronary artery without angina pectoris: Secondary | ICD-10-CM | POA: Insufficient documentation

## 2021-12-14 DIAGNOSIS — F419 Anxiety disorder, unspecified: Secondary | ICD-10-CM | POA: Diagnosis not present

## 2021-12-14 DIAGNOSIS — R195 Other fecal abnormalities: Secondary | ICD-10-CM | POA: Diagnosis not present

## 2021-12-14 DIAGNOSIS — I110 Hypertensive heart disease with heart failure: Secondary | ICD-10-CM | POA: Diagnosis not present

## 2021-12-14 DIAGNOSIS — Z955 Presence of coronary angioplasty implant and graft: Secondary | ICD-10-CM | POA: Insufficient documentation

## 2021-12-14 DIAGNOSIS — I509 Heart failure, unspecified: Secondary | ICD-10-CM | POA: Insufficient documentation

## 2021-12-14 DIAGNOSIS — Z86718 Personal history of other venous thrombosis and embolism: Secondary | ICD-10-CM | POA: Diagnosis not present

## 2021-12-14 DIAGNOSIS — F32A Depression, unspecified: Secondary | ICD-10-CM | POA: Insufficient documentation

## 2021-12-14 DIAGNOSIS — E1051 Type 1 diabetes mellitus with diabetic peripheral angiopathy without gangrene: Secondary | ICD-10-CM | POA: Diagnosis not present

## 2021-12-14 DIAGNOSIS — E785 Hyperlipidemia, unspecified: Secondary | ICD-10-CM | POA: Insufficient documentation

## 2021-12-14 DIAGNOSIS — Z89512 Acquired absence of left leg below knee: Secondary | ICD-10-CM | POA: Insufficient documentation

## 2021-12-14 DIAGNOSIS — Z7984 Long term (current) use of oral hypoglycemic drugs: Secondary | ICD-10-CM | POA: Insufficient documentation

## 2021-12-14 DIAGNOSIS — F17210 Nicotine dependence, cigarettes, uncomplicated: Secondary | ICD-10-CM | POA: Insufficient documentation

## 2021-12-14 DIAGNOSIS — E1165 Type 2 diabetes mellitus with hyperglycemia: Secondary | ICD-10-CM

## 2021-12-14 DIAGNOSIS — K5900 Constipation, unspecified: Secondary | ICD-10-CM

## 2021-12-14 DIAGNOSIS — Z1212 Encounter for screening for malignant neoplasm of rectum: Secondary | ICD-10-CM

## 2021-12-14 DIAGNOSIS — Z1211 Encounter for screening for malignant neoplasm of colon: Secondary | ICD-10-CM | POA: Insufficient documentation

## 2021-12-14 DIAGNOSIS — Z794 Long term (current) use of insulin: Secondary | ICD-10-CM | POA: Diagnosis not present

## 2021-12-14 DIAGNOSIS — N189 Chronic kidney disease, unspecified: Secondary | ICD-10-CM | POA: Insufficient documentation

## 2021-12-14 DIAGNOSIS — R109 Unspecified abdominal pain: Secondary | ICD-10-CM

## 2021-12-14 DIAGNOSIS — I6930 Unspecified sequelae of cerebral infarction: Secondary | ICD-10-CM | POA: Diagnosis not present

## 2021-12-14 DIAGNOSIS — E1065 Type 1 diabetes mellitus with hyperglycemia: Secondary | ICD-10-CM | POA: Diagnosis not present

## 2021-12-14 DIAGNOSIS — Z79899 Other long term (current) drug therapy: Secondary | ICD-10-CM | POA: Insufficient documentation

## 2021-12-14 DIAGNOSIS — K219 Gastro-esophageal reflux disease without esophagitis: Secondary | ICD-10-CM | POA: Insufficient documentation

## 2021-12-14 DIAGNOSIS — E1022 Type 1 diabetes mellitus with diabetic chronic kidney disease: Secondary | ICD-10-CM | POA: Insufficient documentation

## 2021-12-14 DIAGNOSIS — I252 Old myocardial infarction: Secondary | ICD-10-CM | POA: Diagnosis not present

## 2021-12-14 DIAGNOSIS — R194 Change in bowel habit: Secondary | ICD-10-CM

## 2021-12-14 DIAGNOSIS — Z7901 Long term (current) use of anticoagulants: Secondary | ICD-10-CM | POA: Insufficient documentation

## 2021-12-14 HISTORY — PX: COLONOSCOPY WITH PROPOFOL: SHX5780

## 2021-12-14 HISTORY — PX: POLYPECTOMY: SHX5525

## 2021-12-14 LAB — GLUCOSE, CAPILLARY: Glucose-Capillary: 191 mg/dL — ABNORMAL HIGH (ref 70–99)

## 2021-12-14 SURGERY — COLONOSCOPY WITH PROPOFOL
Anesthesia: General

## 2021-12-14 MED ORDER — FENTANYL CITRATE PF 50 MCG/ML IJ SOSY
PREFILLED_SYRINGE | INTRAMUSCULAR | Status: AC
  Administered 2021-12-14: 50 ug
  Filled 2021-12-14: qty 1

## 2021-12-14 MED ORDER — PROPOFOL 10 MG/ML IV BOLUS
INTRAVENOUS | Status: DC | PRN
  Administered 2021-12-14 (×2): 30 mg via INTRAVENOUS
  Administered 2021-12-14: 50 mg via INTRAVENOUS
  Administered 2021-12-14 (×3): 30 mg via INTRAVENOUS

## 2021-12-14 MED ORDER — LACTATED RINGERS IV SOLN: INTRAVENOUS | Status: DC

## 2021-12-14 MED ORDER — FENTANYL CITRATE (PF) 100 MCG/2ML IJ SOLN: 50.0000 ug | Freq: Once | INTRAMUSCULAR | Status: DC

## 2021-12-14 MED ORDER — LIDOCAINE HCL (CARDIAC) PF 100 MG/5ML IV SOSY
PREFILLED_SYRINGE | INTRAVENOUS | Status: DC | PRN
  Administered 2021-12-14: 50 mg via INTRAVENOUS

## 2021-12-14 NOTE — Anesthesia Preprocedure Evaluation (Addendum)
Anesthesia Evaluation  Patient identified by MRN, date of birth, ID band Patient awake    Reviewed: Allergy & Precautions, H&P , NPO status , Patient's Chart, lab work & pertinent test results  Airway Mallampati: II  TM Distance: >3 FB Neck ROM: Full    Dental  (+) Upper Dentures, Lower Dentures   Pulmonary Current Smoker and Patient abstained from smoking.   Pulmonary exam normal breath sounds clear to auscultation       Cardiovascular hypertension, Pt. on medications + CAD, + Past MI, + Cardiac Stents, + Peripheral Vascular Disease, +CHF and + DVT  Normal cardiovascular exam Rhythm:Regular Rate:Normal  Erma Heritage, PA-C 12/20/2020  8:52 AM EST   Please let the patient know the pumping function of her heart remains reduced at 40-45% (normal is 55-65%). She does have thickness of the heart muscle which is likely secondary to HTN as well. As discussed at her office visit, I would recommend we add Hydralazine 12.'5mg'$  twice daily to assist with the pumping function of her heart. This can be titrated over time as BP allows.     Neuro/Psych  Headaches PSYCHIATRIC DISORDERS Anxiety Depression    CVA, Residual Symptoms    GI/Hepatic ,GERD  Medicated and Controlled,,(+) neg Cirrhosis      , Hepatitis -  Endo/Other  diabetes, Poorly Controlled, Type 2, Oral Hypoglycemic Agents, Insulin Dependent    Renal/GU Renal InsufficiencyRenal disease  negative genitourinary   Musculoskeletal  (+)  narcotic dependent  Abdominal   Peds negative pediatric ROS (+)  Hematology negative hematology ROS (+)   Anesthesia Other Findings   Reproductive/Obstetrics negative OB ROS                             Anesthesia Physical Anesthesia Plan  ASA: 3  Anesthesia Plan: General   Post-op Pain Management: Minimal or no pain anticipated   Induction: Intravenous  PONV Risk Score and Plan: 1 and Propofol  infusion  Airway Management Planned: Nasal Cannula and Natural Airway  Additional Equipment:   Intra-op Plan:   Post-operative Plan:   Informed Consent: I have reviewed the patients History and Physical, chart, labs and discussed the procedure including the risks, benefits and alternatives for the proposed anesthesia with the patient or authorized representative who has indicated his/her understanding and acceptance.       Plan Discussed with: CRNA and Surgeon  Anesthesia Plan Comments:         Anesthesia Quick Evaluation

## 2021-12-14 NOTE — Discharge Instructions (Addendum)
  Colonoscopy Discharge Instructions  Read the instructions outlined below and refer to this sheet in the next few weeks. These discharge instructions provide you with general information on caring for yourself after you leave the hospital. Your doctor may also give you specific instructions. While your treatment has been planned according to the most current medical practices available, unavoidable complications occasionally occur.   ACTIVITY You may resume your regular activity, but move at a slower pace for the next 24 hours.  Take frequent rest periods for the next 24 hours.  Walking will help get rid of the air and reduce the bloated feeling in your belly (abdomen).  No driving for 24 hours (because of the medicine (anesthesia) used during the test).   Do not sign any important legal documents or operate any machinery for 24 hours (because of the anesthesia used during the test).  NUTRITION Drink plenty of fluids.  You may resume your normal diet as instructed by your doctor.  Begin with a light meal and progress to your normal diet. Heavy or fried foods are harder to digest and may make you feel sick to your stomach (nauseated).  Avoid alcoholic beverages for 24 hours or as instructed.  MEDICATIONS You may resume your normal medications unless your doctor tells you otherwise.  WHAT YOU CAN EXPECT TODAY Some feelings of bloating in the abdomen.  Passage of more gas than usual.  Spotting of blood in your stool or on the toilet paper.  IF YOU HAD POLYPS REMOVED DURING THE COLONOSCOPY: No aspirin products for 7 days or as instructed.  No alcohol for 7 days or as instructed.  Eat a soft diet for the next 24 hours.  FINDING OUT THE RESULTS OF YOUR TEST Not all test results are available during your visit. If your test results are not back during the visit, make an appointment with your caregiver to find out the results. Do not assume everything is normal if you have not heard from your  caregiver or the medical facility. It is important for you to follow up on all of your test results.  SEEK IMMEDIATE MEDICAL ATTENTION IF: You have more than a spotting of blood in your stool.  Your belly is swollen (abdominal distention).  You are nauseated or vomiting.  You have a temperature over 101.  You have abdominal pain or discomfort that is severe or gets worse throughout the day.   Your colonoscopy revealed 1 polyp(s) which I removed successfully. Await pathology results, my office will contact you. I recommend repeating colonoscopy in 5-10 years for surveillance purposes.   Follow up with GI in 3 months.   Resume Eliquis immediately   I hope you have a great rest of your week!  Elon Alas. Abbey Chatters, D.O. Gastroenterology and Hepatology [facility]

## 2021-12-14 NOTE — H&P (Signed)
Primary Care Physician:  Lindell Spar, MD Primary Gastroenterologist:  Dr. Abbey Chatters  Pre-Procedure History & Physical: HPI:  [name] is a 51 y.o. female is here for a colonoscopy to be performed for positive Cologuard testing.  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 s/p DES to LAD in 2014   Depression    Depression    Phreesia 11/19/2019   Diabetes mellitus without complication ([place])    Phreesia 11/19/2019   Diabetic ketoacidosis with coma associated with type 1 diabetes mellitus ([place])    DKA, type 1 ([place]) 11/17/2016   Facial cellulitis    12/2010   Glomerulonephritis    Headache(784.0)    Hemiplegia, unspecified affecting right dominant side ([place])    History of stroke    Hyperlipidemia    Insulin dependent diabetes mellitus    History of diabetic ketoacidosis   Ischemic cardiomyopathy    a. EF 30-35% in 2018 b. EF at 45-50% by repeat imaging in 02/2019   Left carotid artery occlusion    Major depressive disorder, single episode, unspecified    Pancreatitis, acute 11/07/2016   Peripheral arterial disease ([place])    Shingles    11/2010   ST elevation myocardial infarction (STEMI) of anterior wall [facility])    Late presentation September 2014   Stroke [facility])    2018   Upper abdominal pain 06/03/2021    Past Surgical History:  Procedure Laterality Date   AMPUTATION  03/03/2011   Procedure: AMPUTATION DIGIT;  Surgeon: Newt Minion, MD;  Location: [place];  Service: Orthopedics;  Laterality: Left;   Left Foot Amputation 4th and 5th toes at MTP joint   AMPUTATION  04/22/2011   Procedure: AMPUTATION BELOW KNEE;  Surgeon: Newt Minion, MD;  Location: [place];  Service: Orthopedics;  Laterality: Left;  Left Below Knee Amputation   BIOPSY  07/17/2021   Procedure: BIOPSY;  Surgeon: Eloise Harman, DO;  [facility];  Service: Endoscopy;;   CATARACT EXTRACTION W/PHACO Left 01/09/2020   Procedure: CATARACT EXTRACTION PHACO AND INTRAOCULAR LENS PLACEMENT (IOC);  Surgeon: Baruch Goldmann, MD;  Location: [facility];  Service: Ophthalmology;  Laterality: Left;  CDE: 9.73   DILATION AND CURETTAGE OF UTERUS     EMBOLECTOMY  01/29/2011   Procedure: EMBOLECTOMY;  Surgeon: Mal Misty, MD;  Location[facility] OR;  Service: Vascular;  Laterality: Left;  Left Popliteal and Tibial Embolectomy with patch angioplasty   ESOPHAGOGASTRODUODENOSCOPY (EGD) WITH PROPOFOL N/A 07/17/2021   Procedure: ESOPHAGOGASTRODUODENOSCOPY (EGD) WITH PROPOFOL;  Surgeon: Eloise Harman, DO;  [facility];  Service: Endoscopy;  Laterality: N/A;  1:00pm   INCISION AND DRAINAGE ABSCESS N/A 04/06/2016   Procedure: INCISION AND DRAINAGE ABSCESS;  Surgeon: Jonnie Kind, MD;  Location[facility];  Service: Gynecology;  Laterality: N/A;   LEFT HEART CATHETERIZATION WITH CORONARY ANGIOGRAM N/A 10/01/2012   Procedure: LEFT HEART CATHETERIZATION WITH CORONARY ANGIOGRAM;  Surgeon: Peter M Martinique, MD;  Location: [facility] CATH LAB;  Service: Cardiovascular;  Laterality: N/A;   LOWER EXTREMITY ANGIOGRAM N/A 01/27/2011   Procedure: LOWER EXTREMITY ANGIOGRAM;  Surgeon: Rosetta Posner, MD;  Location: [facility] C[facility] LAB;  Service: Cardiovascular;  Laterality: N/A;   LOWER EXTREMITY ANGIOGRAM N/A 01/28/2011   Procedure: LOWER EXTREMITY ANGIOGRAM;  Surgeon: Conrad Millbury, MD;  Location: [facility] CATH LAB;  Service: Cardiovascular;  Laterality: N/A;   LOWER EXTREMITY ANGIOGRAM Left 01/29/2011   Procedure: LOWER EXTREMITY ANGIOGRAM;  Surgeon: Mal Misty, MD;   Location: [facility] CATH LAB;  Service: Cardiovascular;  Laterality: Left;   MULTIPLE TOOTH EXTRACTIONS     TEE WITHOUT CARDIOVERSION  04/15/2011   Procedure: TRANSESOPHAGEAL ECHOCARDIOGRAM (TEE);  Surgeon: [name], MD;  [facility];  Service: Cardiovascular;  Laterality: N/A;   WRIST SURGERY     Left    Prior to Admission medications   Medication Sig Start Date End Date Taking? Authorizing Provider  acetaminophen (TYLENOL) 500 MG tablet Take 500 mg by mouth every 6 (six) hours as needed for moderate pain.   Yes [provider]  atorvastatin (LIPITOR) 80 MG tablet Take 1 tablet (80 mg total) by mouth daily. Patient taking differently: Take 40 mg by mouth 2 (two) times daily. 11/06/20  Yes Strader, [place] M, PA-C  calcium carbonate (TUMS EX) 750 MG chewable tablet Chew 1 tablet by mouth daily as needed for heartburn.   Yes [provider]  carvedilol (COREG) 6.25 MG tablet Take 1 tablet (6.25 mg total) by mouth 2 (two) times daily with a meal. 11/06/20  Yes Strader, [place] M, PA-C  cetirizine (ZYRTEC) 10 MG tablet Take 10 mg by mouth daily as needed for allergies.   Yes [provider]  hydrALAZINE (APRESOLINE) 25 MG tablet Take 0.5 tablets (12.5 mg total) by mouth 2 (two) times daily. 12/22/20  Yes Strader, [place], PA-C  insulin aspart (NOVOLOG) 100 UNIT/ML FlexPen Inject 2-8 Units into the skin 3 (three) times daily with meals. Patient taking differently: Inject 1-7 Units into the skin 3 (three) times daily with meals. 04/08/21  Yes Brita Romp, NP  linaclotide [facility]) 145 MCG CAPS capsule Take 1 capsule (145 mcg total) by mouth daily before breakfast. 11/09/21  Yes Mahala Menghini, PA-C  omeprazole (PRILOSEC) 20 MG capsule Take 1 capsule (20 mg total) by mouth daily. 05/27/21  Yes Alvira Monday, FNP  ondansetron (ZOFRAN) 4 MG tablet TAKE ONE TABLET BY MOUTH (4MG) EVERY 4 TO 6 HOURS AS NEEDED FOR NAUSEA OR VOMITING 12/01/21  Yes Mahala Menghini, PA-C  oxyCODONE-acetaminophen (PERCOCET) 10-325 MG tablet Take 0.5 tablets every 4 (four) hours as needed by mouth for pain. Patient taking differently: Take 1 tablet by mouth every 4 (four) hours as needed for pain. 11/24/16  Yes Regalado, Belkys A, MD  simethicone (GAS-X) 80 MG chewable tablet Chew 80 mg by mouth every 6 (six) hours as needed for flatulence.   Yes [provider]  apixaban (ELIQUIS) 2.5 MG TABS tablet Take 1 tablet (2.5 mg total) by mouth 2 (two) times daily. 11/06/20   Strader, Fransisco Hertz, PA-C  glucose blood (ACCU-CHEK GUIDE) test strip Use as instructed to monitor glucose 4 times daily, before meals and before bed. 12/02/21   Brita Romp, NP  Insulin Pen Needle (UNIFINE PENTIPS) 31G X 8 MM MISC USE TO INJECT INSULIN SUBCUTANEOUSLY . USE FOR BOTH SLIDING SCALE AND FOR DAILYINSULIN DOSES. 07/24/20  Brita Romp, NP  mirtazapine (REMERON) 15 MG tablet Take 1 tablet (15 mg total) by mouth at bedtime. Patient not taking: Reported on 12/01/2021 08/04/21   Lindell Spar, MD  nitroGLYCERIN (NITROSTAT) 0.4 MG SL tablet Place 1 tablet (0.4 mg total) under the tongue every 5 (five) minutes as needed for chest pain. 11/06/20   Strader, Fransisco Hertz, PA-C  Sod Picosulfate-Mag Ox-Cit Acd (CLENPIQ) 10-3.5-12 MG-GM -GM/175ML SOLN Take 1 kit by mouth as directed. 11/09/21   Flor Houdeshell K, DO  TRESIBA FLEXTOUCH 100 UNIT/ML FlexTouch Pen Inject 10 Units into the skin at bedtime. 08/04/21   [provider]    Allergies as of 11/09/2021 - Review Complete 11/09/2021  Allergen Reaction Noted   Ibuprofen Other (See Comments) 11/03/2010   Losartan Swelling 04/23/2011   Mirtazapine  09/30/2021    Family History  Problem Relation Age of Onset   COPD Mother    Hypertension Mother    Diabetes Mother    Stroke Mother    Coronary artery disease Mother    Diabetes Father    Hypertension Father    Coronary artery disease Father    Kidney disease Father         on dialysis, died from heart attack while being dialzed   Pancreatitis Maternal Aunt    Cancer Maternal Aunt        unknown primary   Anesthesia problems Neg Hx    Colon cancer Neg Hx     Social History   Socioeconomic History   Marital status: Single    Spouse name: Not on file   Number of children: Not on file   Years of education: Not on file   Highest education level: Not on file  Occupational History   Not on file  Tobacco Use   Smoking status: Every Day    Packs/day: 0.50    Years: 21.00    Total pack years: 10.50    Types: Cigarettes    Start date: 07/06/1989   Smokeless tobacco: Never  Vaping Use   Vaping Use: Never used  Substance and Sexual Activity   Alcohol use: No    Alcohol/week: 0.0 standard drinks of alcohol    Comment: rarely   Drug use: Not Currently    Frequency: 1.0 times per week    Types: Marijuana   Sexual activity: Yes    Partners: Male    Comment: pt stated that her tubal was only 50% effective- "not cut and burned"  Other Topics Concern   Not on file  Social History Narrative   Unemployed.  Lives in [place] with [place], [place], and mother.  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   Social Determinants of Health   Financial Resource Strain: Not on file  Food Insecurity: Not on file  Transportation Needs: Not on file  Physical Activity: Not on file  Stress: Not on file  Social Connections: Not on file  Intimate Partner Violence: Not on file    Review of Systems: See HPI, otherwise negative ROS  Physical Exam: Vital signs in last 24 hours: Temp:  [98 F (36.7 C)] 98 F (36.7 C) (11/27 0910) Pulse Rate:  [89] 89 (11/27 0910) Resp:  [16] 16 (11/27 0910) BP: (177)/(73) 177/73 (11/27 0910) SpO2:  [97 %] 97 % (11/27 0910) Weight:  [46 kg] 46 kg (11/27 0910)   General:   Alert,  Well-developed, well-nourished, pleasant and cooperative in NAD Head:  Normocephalic and atraumatic. Eyes:  Sclera  clear, no icterus.    Conjunctiva pink. Ears:  Normal auditory acuity. Nose:  No deformity, discharge,  or lesions. Mouth:  No deformity or lesions, dentition normal. Neck:  Supple; no masses or thyromegaly. Lungs:  Clear throughout to auscultation.   No wheezes, crackles, or rhonchi. No acute distress. Heart:  Regular rate and rhythm; no murmurs, clicks, rubs,  or gallops. Abdomen:  Soft, nontender and nondistended. No masses, hepatosplenomegaly or hernias noted. Normal bowel sounds, without guarding, and without rebound.   Msk:  Symmetrical without gross deformities. Normal posture. Extremities:  Without clubbing or edema. Neurologic:  Alert and  oriented x4;  grossly normal neurologically. Skin:  Intact without significant lesions or rashes. Cervical Nodes:  No significant cervical adenopathy. Psych:  Alert and cooperative. Normal mood and affect.  Impression/Plan: [name] is here for a colonoscopy to be performed for positive Cologuard testing.  The risks of the procedure including infection, bleed, or perforation as well as benefits, limitations, alternatives and imponderables have been reviewed with the patient. Questions have been answered. All parties agreeable.

## 2021-12-14 NOTE — Op Note (Signed)
[facility] Patient Name: [name] Procedure Date: 12/14/2021 9:31 AM MRN: [id number] Date of Birth: 09-02-1970 Attending MD: Elon Alas. Abbey Chatters , [place], [id number] CSN: [id number] Age: 51 Admit Type: Outpatient Procedure:                Colonoscopy Indications:              Screening for colorectal malignant neoplasm,                            Incidental - Positive Cologuard test Providers:                Elon Alas. Abbey Chatters, DO, Lambert Mody, Raphael Gibney, Technician Referring MD:              Medicines:                See the Anesthesia note for documentation of the                            administered medications Complications:            No immediate complications. Estimated Blood Loss:     Estimated blood loss was minimal. Procedure:                Pre-Anesthesia Assessment:                           - The anesthesia plan was to use monitored                            anesthesia care (MAC).                           After obtaining informed consent, the colonoscope                            was passed under direct vision. Throughout the                            procedure, the patient's blood pressure, pulse, and                            oxygen saturations were monitored continuously. The                            PCF-HQ190L ([id number]) scope was introduced through                            the anus and advanced to the the cecum, identified                            by appendiceal orifice and ileocecal valve. The                            colonoscopy was technically difficult and complex  due to a tortuous colon. The patient tolerated the                            procedure well. The quality of the bowel                            preparation was evaluated using the BBPS [facility]                            Bowel Preparation Scale) with scores of: Right                            Colon = 3, Transverse  Colon = 3 and Left Colon = 3                            (entire mucosa seen well with no residual staining,                            small fragments of stool or opaque liquid). The                            total BBPS score equals 9. Scope In: 9:46:10 AM Scope Out: 10:01:42 AM Scope Withdrawal Time: 0 hours 8 minutes 33 seconds  Total Procedure Duration: 0 hours 15 minutes 32 seconds  Findings:      The perianal and digital rectal examinations were normal.      Non-bleeding internal hemorrhoids were found during endoscopy.      An 8 mm polyp was found in the sigmoid colon. The polyp was flat. The       polyp was removed with a cold snare. Resection and retrieval were       complete.      The exam was otherwise without abnormality. Impression:               - Non-bleeding internal hemorrhoids.                           - One 8 mm polyp in the sigmoid colon, removed with                            a cold snare. Resected and retrieved.                           - The examination was otherwise normal. Moderate Sedation:      Per Anesthesia Care Recommendation:           - Patient has a contact number available for                            emergencies. The signs and symptoms of potential                            delayed complications were discussed with the  patient. Return to normal activities tomorrow.                            Written discharge instructions were provided to the                            patient.                           - Resume previous diet.                           - Continue present medications.                           - Await pathology results.                           - Repeat colonoscopy in 5-10 years for surveillance.                           - Return to GI clinic in 3 months. Procedure Code(s):        --- Professional ---                           [phone], Colonoscopy, flexible; with removal of                             tumor(s), polyp(s), or other lesion(s) by snare                            technique Diagnosis Code(s):        --- Professional ---                           Z12.11, Encounter for screening for malignant                            neoplasm of colon                           D12.5, Benign neoplasm of sigmoid colon                           K64.8, Other hemorrhoids CPT copyright 2022 American Medical Association. All rights reserved. The codes documented in this report are preliminary and upon coder review may  be revised to meet current compliance requirements. Elon Alas. Abbey Chatters, DO [place] Abbey Chatters, DO 12/14/2021 10:03:39 AM This report has been signed electronically. Number of Addenda: 0

## 2021-12-14 NOTE — Progress Notes (Signed)
Pt states she has boil on right groin area with gauze in place.

## 2021-12-14 NOTE — Anesthesia Postprocedure Evaluation (Signed)
Anesthesia Post Note  Patient: [name]  Procedure(s) Performed: COLONOSCOPY WITH PROPOFOL POLYPECTOMY  Patient location during evaluation: Phase II Anesthesia Type: General Level of consciousness: awake and alert and oriented Pain management: pain level controlled Vital Signs Assessment: post-procedure vital signs reviewed and stable Respiratory status: spontaneous breathing, nonlabored ventilation and respiratory function stable Cardiovascular status: blood pressure returned to baseline and stable Postop Assessment: no apparent nausea or vomiting Anesthetic complications: no  No notable events documented.   Last Vitals:  Vitals:   12/14/21 0910 12/14/21 1007  BP: (!) 177/73 (!) 105/54  Pulse: 89 83  Resp: 16 15  Temp: 36.7 C 36.8 C  SpO2: 97% 96%    Last Pain:  Vitals:   12/14/21 1007  TempSrc: Oral  PainSc: 4                  Dillan Candela C Placida Cambre

## 2021-12-14 NOTE — Transfer of Care (Signed)
Immediate Anesthesia Transfer of Care Note  Patient: AYMAR WHITFILL  Procedure(s) Performed: COLONOSCOPY WITH PROPOFOL POLYPECTOMY  Patient Location: Short Stay  Anesthesia Type:General  Level of Consciousness: awake  Airway & Oxygen Therapy: Patient Spontanous Breathing  Post-op Assessment: Report given to RN and Post -op Vital signs reviewed and stable  Post vital signs: Reviewed and stable  Last Vitals:  Vitals Value Taken Time  BP 105/54 12/14/21 1007  Temp 36.8 C 12/14/21 1007  Pulse 83 12/14/21 1007  Resp 15 12/14/21 1007  SpO2 96 % 12/14/21 1007    Last Pain:  Vitals:   12/14/21 1007  TempSrc: Oral  PainSc: 4          Complications: No notable events documented.

## 2021-12-15 LAB — SURGICAL PATHOLOGY

## 2021-12-18 ENCOUNTER — Encounter (HOSPITAL_COMMUNITY): Payer: Self-pay | Admitting: Internal Medicine

## 2021-12-23 ENCOUNTER — Encounter: Payer: Self-pay | Admitting: Nurse Practitioner

## 2021-12-23 ENCOUNTER — Ambulatory Visit (INDEPENDENT_AMBULATORY_CARE_PROVIDER_SITE_OTHER): Payer: Medicaid Other | Admitting: Nurse Practitioner

## 2021-12-23 VITALS — BP 179/89 | HR 71 | Ht 61.0 in | Wt 104.0 lb

## 2021-12-23 DIAGNOSIS — E782 Mixed hyperlipidemia: Secondary | ICD-10-CM

## 2021-12-23 DIAGNOSIS — E559 Vitamin D deficiency, unspecified: Secondary | ICD-10-CM | POA: Diagnosis not present

## 2021-12-23 DIAGNOSIS — I10 Essential (primary) hypertension: Secondary | ICD-10-CM | POA: Diagnosis not present

## 2021-12-23 DIAGNOSIS — E1059 Type 1 diabetes mellitus with other circulatory complications: Secondary | ICD-10-CM | POA: Diagnosis not present

## 2021-12-23 LAB — POCT GLYCOSYLATED HEMOGLOBIN (HGB A1C): Hemoglobin A1C: 10.5 % — AB (ref 4.0–5.6)

## 2021-12-23 MED ORDER — TRESIBA FLEXTOUCH 100 UNIT/ML ~~LOC~~ SOPN: 10.0000 [IU] | PEN_INJECTOR | Freq: Every day | SUBCUTANEOUS | 3 refills | Status: DC

## 2021-12-23 MED ORDER — INSULIN ASPART 100 UNIT/ML FLEXPEN: 1.0000 [IU] | PEN_INJECTOR | Freq: Three times a day (TID) | SUBCUTANEOUS | 3 refills | Status: DC

## 2021-12-23 NOTE — Progress Notes (Signed)
Endocrinology Follow Up Note       12/23/2021, 2:37 PM   Subjective:    Patient ID: [name], female    DOB: 1970-04-30.  [name] is being seen in follow up after being seen in consultation for management of currently uncontrolled symptomatic diabetes requested by  Lindell Spar, MD.   Past Medical History:  Diagnosis Date   Acquired absence of left leg below knee ([place])    Anxiety    was on xanax for years but discontinued prior to prior PCP retiring as she was on opioids and was told she would not find someone to write for both   Arterial thromboembolism ([place])    Prior embolectomy, previously on Coumadin   Cataract    Phreesia 11/19/2019   Chronic kidney disease    Phreesia 11/19/2019   Clotting disorder ([place])    Phreesia 11/19/2019   Coronary atherosclerosis of native coronary artery    a. s/p DES to LAD in 2014   Depression    Depression    Phreesia 11/19/2019   Diabetes mellitus without complication ([place])    Phreesia 11/19/2019   Diabetic ketoacidosis with coma associated with type 1 diabetes mellitus ([place])    DKA, type 1 ([place]) 11/17/2016   Facial cellulitis    12/2010   Glomerulonephritis    Headache(784.0)    Hemiplegia, unspecified affecting right dominant side ([place])    History of stroke    Hyperlipidemia    Insulin dependent diabetes mellitus    History of diabetic ketoacidosis   Ischemic cardiomyopathy    a. EF 30-35% in 2018 b. EF at 45-50% by repeat imaging in 02/2019   Left carotid artery occlusion    Major depressive disorder, single episode, unspecified    Pancreatitis, acute 11/07/2016   Peripheral arterial disease ([place])    Shingles    11/2010   ST elevation myocardial infarction (STEMI) of anterior wall [facility])    Late presentation September 2014   Stroke [facility])    2018   Upper abdominal pain 06/03/2021    Past Surgical History:  Procedure Laterality Date    AMPUTATION  03/03/2011   Procedure: AMPUTATION DIGIT;  Surgeon: Newt Minion, MD;  Location: [facility];  Service: Orthopedics;  Laterality: Left;  Left Foot Amputation 4th and 5th toes at MTP joint   AMPUTATION  04/22/2011   Procedure: AMPUTATION BELOW KNEE;  Surgeon: Newt Minion, MD;  Location: [place];  Service: Orthopedics;  Laterality: Left;  Left Below Knee Amputation   BIOPSY  07/17/2021   Procedure: BIOPSY;  Surgeon: Eloise Harman, DO;  [facility];  Service: Endoscopy;;   CATARACT EXTRACTION W/PHACO Left 01/09/2020   Procedure: CATARACT EXTRACTION PHACO AND INTRAOCULAR LENS PLACEMENT (IOC);  Surgeon: Baruch Goldmann, MD;  Location: [facility];  Service: Ophthalmology;  Laterality: Left;  CDE: 9.73   COLONOSCOPY WITH PROPOFOL N/A 12/14/2021   Procedure: COLONOSCOPY WITH PROPOFOL;  Surgeon: Eloise Harman, DO;  [facility];  Service: Endoscopy;  Laterality: N/A;  9:45 am   DILATION AND CURETTAGE OF UTERUS     EMBOLECTOMY  01/29/2011   Procedure: EMBOLECTOMY;  Surgeon: Mal Misty, MD;  [facility];  Service: Vascular;  Laterality: Left;  Left Popliteal and Tibial Embolectomy with patch angioplasty   ESOPHAGOGASTRODUODENOSCOPY (EGD) WITH PROPOFOL N/A 07/17/2021   Procedure: ESOPHAGOGASTRODUODENOSCOPY (EGD) WITH PROPOFOL;  Surgeon: Eloise Harman, DO;  [facility];  Service: Endoscopy;  Laterality: N/A;  1:00pm   INCISION AND DRAINAGE ABSCESS N/A 04/06/2016   Procedure: INCISION AND DRAINAGE ABSCESS;  Surgeon: Jonnie Kind, MD;  Location: [facility];  Service: Gynecology;  Laterality: N/A;   LEFT HEART CATHETERIZATION WITH CORONARY ANGIOGRAM N/A 10/01/2012   Procedure: LEFT HEART CATHETERIZATION WITH CORONARY ANGIOGRAM;  Surgeon: Peter M Martinique, MD;  Location: [facility] CATH LAB;  Service: Cardiovascular;  Laterality: N/A;   LOWER EXTREMITY ANGIOGRAM N/A 01/27/2011   Procedure: LOWER EXTREMITY ANGIOGRAM;  Surgeon: Rosetta Posner, MD;  Location: [facility] CATH LAB;  Service:  Cardiovascular;  Laterality: N/A;   LOWER EXTREMITY ANGIOGRAM N/A 01/28/2011   Procedure: LOWER EXTREMITY ANGIOGRAM;  Surgeon: Conrad Walcott, MD;  Location: [facility] CATH LAB;  Service: Cardiovascular;  Laterality: N/A;   LOWER EXTREMITY ANGIOGRAM Left 01/29/2011   Procedure: LOWER EXTREMITY ANGIOGRAM;  Surgeon: Mal Misty, MD;  Location: [facility] CATH LAB;  Service: Cardiovascular;  Laterality: Left;   MULTIPLE TOOTH EXTRACTIONS     POLYPECTOMY  12/14/2021   Procedure: POLYPECTOMY;  Surgeon: Eloise Harman, DO;  [facility];  Service: Endoscopy;;   TEE WITHOUT CARDIOVERSION  04/15/2011   Procedure: TRANSESOPHAGEAL ECHOCARDIOGRAM (TEE);  Surgeon: [name], MD;  [facility];  Service: Cardiovascular;  Laterality: N/A;   WRIST SURGERY     Left    Social History   Socioeconomic History   Marital status: Single    Spouse name: Not on file   Number of children: Not on file   Years of education: Not on file   Highest education level: Not on file  Occupational History   Not on file  Tobacco Use   Smoking status: Every Day    Packs/day: 0.50    Years: 21.00    Total pack years: 10.50    Types: Cigarettes    Start date: 07/06/1989   Smokeless tobacco: Never  Vaping Use   Vaping Use: Never used  Substance and Sexual Activity   Alcohol use: No    Alcohol/week: 0.0 standard drinks of alcohol    Comment: rarely   Drug use: Not Currently    Frequency: 1.0 times per week    Types: Marijuana   Sexual activity: Yes    Partners: Male    Comment: pt stated that her tubal was only 50% effective- "not cut and burned"  Other Topics Concern   Not on file  Social History Narrative   Unemployed.  Lives in [place] with [place], [place], and mother.  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   Social Determinants of Health   Financial Resource Strain: Not on file  Food Insecurity: Not on file  Transportation Needs: Not on file  Physical Activity: Not on file   Stress: Not on file  Social Connections: Not on file    Family History  Problem Relation Age of Onset   COPD Mother    Hypertension Mother    Diabetes Mother    Stroke Mother    Coronary artery disease Mother    Diabetes Father    Hypertension Father    Coronary artery disease Father    Kidney disease Father        on dialysis,  died from heart attack while being dialzed   Pancreatitis Maternal Aunt    Cancer Maternal Aunt        unknown primary   Anesthesia problems Neg Hx    Colon cancer Neg Hx     Outpatient Encounter Medications as of 12/23/2021  Medication Sig   acetaminophen (TYLENOL) 500 MG tablet Take 500 mg by mouth every 6 (six) hours as needed for moderate pain.   apixaban (ELIQUIS) 2.5 MG TABS tablet Take 1 tablet (2.5 mg total) by mouth 2 (two) times daily.   atorvastatin (LIPITOR) 80 MG tablet Take 1 tablet (80 mg total) by mouth daily. (Patient taking differently: Take 40 mg by mouth 2 (two) times daily.)   calcium carbonate (TUMS EX) 750 MG chewable tablet Chew 1 tablet by mouth daily as needed for heartburn.   carvedilol (COREG) 6.25 MG tablet Take 1 tablet (6.25 mg total) by mouth 2 (two) times daily with a meal.   cetirizine (ZYRTEC) 10 MG tablet Take 10 mg by mouth daily as needed for allergies.   glucose blood (ACCU-CHEK GUIDE) test strip Use as instructed to monitor glucose 4 times daily, before meals and before bed.   hydrALAZINE (APRESOLINE) 25 MG tablet Take 0.5 tablets (12.5 mg total) by mouth 2 (two) times daily.   Insulin Pen Needle (UNIFINE PENTIPS) 31G X 8 MM MISC USE TO INJECT INSULIN SUBCUTANEOUSLY . USE FOR BOTH SLIDING SCALE AND FOR DAILYINSULIN DOSES.   linaclotide (LINZESS) 145 MCG CAPS capsule Take 1 capsule (145 mcg total) by mouth daily before breakfast.   nitroGLYCERIN (NITROSTAT) 0.4 MG SL tablet Place 1 tablet (0.4 mg total) under the tongue every 5 (five) minutes as needed for chest pain.   omeprazole (PRILOSEC) 20 MG capsule Take 1  capsule (20 mg total) by mouth daily.   ondansetron (ZOFRAN) 4 MG tablet TAKE ONE TABLET BY MOUTH ('4MG'$ ) EVERY 4 TO 6 HOURS AS NEEDED FOR NAUSEA OR VOMITING   oxyCODONE-acetaminophen (PERCOCET) 10-325 MG tablet Take 0.5 tablets every 4 (four) hours as needed by mouth for pain. (Patient taking differently: Take 1 tablet by mouth every 4 (four) hours as needed for pain.)   simethicone (GAS-X) 80 MG chewable tablet Chew 80 mg by mouth every 6 (six) hours as needed for flatulence.   [DISCONTINUED] insulin aspart (NOVOLOG) 100 UNIT/ML FlexPen Inject 2-8 Units into the skin 3 (three) times daily with meals. (Patient taking differently: Inject 1-7 Units into the skin 3 (three) times daily with meals.)   [DISCONTINUED] TRESIBA FLEXTOUCH 100 UNIT/ML FlexTouch Pen Inject 10 Units into the skin at bedtime.   insulin aspart (NOVOLOG) 100 UNIT/ML FlexPen Inject 1-7 Units into the skin 3 (three) times daily with meals.   mirtazapine (REMERON) 15 MG tablet Take 1 tablet (15 mg total) by mouth at bedtime. (Patient not taking: Reported on 12/01/2021)   TRESIBA FLEXTOUCH 100 UNIT/ML FlexTouch Pen Inject 10 Units into the skin at bedtime.   No facility-administered encounter medications on file as of 12/23/2021.    ALLERGIES: Allergies  Allergen Reactions   Losartan Swelling   Mirtazapine     Dizziness/confusion    Nsaids     Kidney disease    VACCINATION STATUS: Immunization History  Administered Date(s) Administered   Influenza, Seasonal, Injecte, Preservative Fre 11/23/2018   Influenza,inj,Quad PF,6+ Mos 10/24/2014, 11/28/2019, 11/13/2020   Influenza-Unspecified 11/08/2011, 01/15/2013, 11/20/2015   Pneumococcal Polysaccharide-23 11/08/2016    Diabetes She presents for her follow-up diabetic visit. She has type 1 diabetes mellitus.  Onset time: She was diagnosed at approximate age of 55. Her disease course has been fluctuating. There are no hypoglycemic associated symptoms. Associated symptoms include  blurred vision, fatigue, foot paresthesias, polydipsia, polyphagia and polyuria. Pertinent negatives for diabetes include no weight loss. There are no hypoglycemic complications. (Has has several hypoglycemic events in the past where EMS was called.  She is extremely scared of becoming hypoglycemic again.) Symptoms are stable. Diabetic complications include a CVA, heart disease, nephropathy, peripheral neuropathy and PVD. Risk factors for coronary artery disease include diabetes mellitus, dyslipidemia, hypertension, sedentary lifestyle and tobacco exposure. Current diabetic treatment includes intensive insulin program. She is compliant with treatment most of the time. Her weight is fluctuating minimally. She is following a generally unhealthy diet. When asked about meal planning, she reported none. She has not had a previous visit with a dietitian. She never (Has L AKA with prosthesis) participates in exercise. Her home blood glucose trend is fluctuating dramatically. (She presents today, accompanied by her daughter, with her glucose readings showing fluctuating glycemic profile overall.  Her POCT A1c today is 10.5%, improving from last visit of 11.6%.  She still reports she is unable to eat, has no appetite.  She denies any significant hypoglycemia since lowering her Tyler Aas between visits.) An ACE inhibitor/angiotensin II receptor blocker is not being taken. She sees a podiatrist.Eye exam is current.  Hyperlipidemia This is a chronic problem. The current episode started more than 1 year ago. The problem is uncontrolled. Recent lipid tests were reviewed and are variable. Exacerbating diseases include chronic renal disease and diabetes. Factors aggravating her hyperlipidemia include beta blockers, smoking and fatty foods. Current antihyperlipidemic treatment includes statins. Compliance problems include adherence to diet and adherence to exercise.  Risk factors for coronary artery disease include diabetes  mellitus, dyslipidemia, hypertension and a sedentary lifestyle.  Hypertension This is a chronic problem. The current episode started more than 1 year ago. The problem has been waxing and waning since onset. The problem is uncontrolled. Associated symptoms include blurred vision. There are no associated agents to hypertension. Risk factors for coronary artery disease include diabetes mellitus, dyslipidemia, smoking/tobacco exposure and sedentary lifestyle. Past treatments include beta blockers. The current treatment provides mild improvement. Compliance problems include diet and exercise.  Hypertensive end-organ damage includes kidney disease, CAD/MI, CVA, heart failure and PVD. Identifiable causes of hypertension include chronic renal disease.    Review of systems  Constitutional: +minimally fluctuating body weight,  current Body mass index is 19.65 kg/m. 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), no xerophthalmia ENT: no sore throat, no nodules palpated in throat, no dysphagia/odynophagia, no hoarseness Cardiovascular: no chest pain, no shortness of breath, no palpitations, no leg swelling Respiratory: no cough, no shortness of breath Gastrointestinal: no nausea/vomiting/diarrhea Genitourinary: +polyuria, + polyphagia Musculoskeletal: no muscle/joint aches, Hx of L AKA- with prosthesis Skin: no rashes, no hyperemia Neurological: no tremors, no numbness, no tingling, no dizziness Psychiatric: no depression, no anxiety   Objective:    BP (!) 179/89 (BP Location: Left Leg, Patient Position: Sitting, Cuff Size: Normal) Comment: Patient has not taken her BP medication today  Pulse 71   Ht '5\' 1"'$  (1.549 m)   Wt 104 lb (47.2 kg)   LMP 12/28/2010   BMI 19.65 kg/m   Wt Readings from Last 3 Encounters:  12/23/21 104 lb (47.2 kg)  12/14/21 101 lb 6.6 oz (46 kg)  12/03/21 101 lb 6.6  oz (46 kg)     BP Readings from Last 3 Encounters:  12/23/21 (!) 179/89  12/14/21 (!) 105/54  12/03/21 135/63    Physical Exam- Limited  Constitutional:  Body mass index is 19.65 kg/m. , not in acute distress, mildly anxious state of mind, tearful during visit, irritable Eyes:  EOMI, no exophthalmos Neck: Supple Cardiovascular: RRR, no murmurs, rubs, or gallops, no edema Respiratory: Adequate breathing efforts, no crackles, rales, rhonchi, or wheezing Musculoskeletal: L AKA with prosthesis, strength intact in all four extremities, no gross restriction of joint movements Skin:  no rashes, no hyperemia, + nicotinic discoloration to fingernails Neurological: no tremor with outstretched hands    CMP ( most recent) CMP     Component Value Date/Time   NA 137 12/08/2021 1015   NA 147 (H) 04/09/2021 1227   K 5.4 (H) 12/08/2021 1015   CL 106 12/08/2021 1015   CO2 27 12/08/2021 1015   GLUCOSE 172 (H) 12/08/2021 1015   BUN 38 (H) 12/08/2021 1015   BUN 33 (H) 04/09/2021 1227   CREATININE 2.70 (H) 12/08/2021 1015   CALCIUM 8.2 (L) 12/08/2021 1015   PROT 6.1 05/01/2021 1100   ALBUMIN 3.6 (L) 05/01/2021 1100   AST 14 05/01/2021 1100   ALT 19 05/01/2021 1100   ALKPHOS 100 05/01/2021 1100   BILITOT 0.3 05/01/2021 1100   GFRNONAA 21 (L) 12/08/2021 1015   GFRAA 28 (L) 09/28/2019 1456     Diabetic Labs (most recent): Lab Results  Component Value Date   HGBA1C 10.5 (A) 12/23/2021   HGBA1C 11.6 (A) 04/13/2021   HGBA1C 14.0 (A) 07/24/2020     Lipid Panel ( most recent) Lipid Panel     Component Value Date/Time   CHOL 151 04/09/2021 1227   TRIG 75 04/09/2021 1227   HDL 59 04/09/2021 1227   CHOLHDL 2.6 04/09/2021 1227   CHOLHDL 4.2 11/07/2016 0722   VLDL 30 11/07/2016 0722   LDLCALC 77 04/09/2021 1227   LABVLDL 15 04/09/2021 1227       Assessment & Plan:   1) Type 1 diabetes mellitus with other circulatory complication ([place])  She presents today, accompanied by her daughter, with  her glucose readings showing fluctuating glycemic profile overall.  Her POCT A1c today is 10.5%, improving from last visit of 11.6%.  She still reports she is unable to eat, has no appetite.  She denies any significant hypoglycemia since lowering her Tyler Aas between visits.  - [name] has currently uncontrolled symptomatic type 2 DM since 51 years of age.  Recent labs reviewed.  - I had a long discussion with her about the progressive nature of diabetes and the pathology behind its complications. -her diabetes is complicated by CAD, PVD, CKD, CVA, MI, DKA and she remains at a high risk for more acute and chronic complications which include retinopathy, and neuropathy. These are all discussed in detail with her.  - Nutritional counseling repeated at each appointment due to patients tendency to fall back in to old habits.  - The patient admits there is a room for improvement in their diet and drink choices. -  Suggestion is made for the patient to avoid simple carbohydrates from their diet including Cakes, Sweet Desserts / Pastries, Ice Cream, Soda (diet and regular), Sweet Tea, Candies, Chips, Cookies, Sweet Pastries, Store Bought Juices, Alcohol in Excess of 1-2 drinks a day, Artificial Sweeteners, Coffee Creamer, and "Sugar-free" Products. This will help patient to have stable blood glucose profile and potentially avoid  unintended weight gain.   - I encouraged the patient to switch to unprocessed or minimally processed complex starch and increased protein intake (animal or plant source), fruits, and vegetables.   - Patient is advised to stick to a routine mealtimes to eat 3 meals a day and avoid unnecessary snacks (to snack only to correct hypoglycemia).  - she will be scheduled with Jearld [place], RDN, CDE for diabetes education.  - I have approached her with the following individualized plan to manage  her diabetes and patient agrees:    -She is advised to continue her current dose  Tresiba 10 units SQ nightly.  She can continue her Novolog 1-7 units TID with meals if glucose is above 90 and she is eating (Specific instructions on how to titrate insulin dosage based on glucose readings given to patient in writing).  She is limited by her lack of meal routine.     -She is encouraged to continue monitoring blood glucose 4 times per day using her CGM (which she was recently approved for), before meals and before bed, and notify the clinic if she has readings less than 70 or greater than 300 for 3 tests in a row.  She could benefit from CGM device for safety purposes to help avoid debilitating low glucose events but insurance will not cover it given she did not use it properly nor see improvement in her glucose when she used it previously.  - she is warned not to take insulin without proper monitoring per orders.  - Adjustment parameters are given to her for hypo and hyperglycemia in writing.  - Specific targets for  A1c;  LDL, HDL,  and Triglycerides were discussed with the patient.  2) Blood Pressure /Hypertension:  Her blood pressure is not controlled to target.  She is advised to continue Carvedilol 6.25 mg po twice daily and Hydralazine 12.5 mg po twice daily.  Will defer any med changes to nephrology.  3) Lipids/Hyperlipidemia:  Her most recent lipid panel from 04/09/21 shows controlled LDL of 77.5.  She is advised to continue Lipitor 80 mg po daily at bedtime.  Side effects and precautions discussed with her.  She is also advised to avoid fried foods and butter.  4)  Weight/Diet: Her Body mass index is 19.65 kg/m.  -  she is not a candidate for weight loss.  Exercise, and detailed carbohydrates information provided  -  detailed on discharge instructions.  5) Vitamin D deficiency Her most recent vitamin D level was 16.2 on 04/09/21.  She is not currently on supplementation.    6) Chronic Care/Health Maintenance: -she is on Statin medications and is encouraged to initiate  and continue to follow up with Ophthalmology, Dentist,  Podiatrist at least yearly or according to recommendations, and advised to [place]. I have recommended yearly flu vaccine and pneumonia vaccine at least every 5 years; moderate intensity exercise for up to 150 minutes weekly; and  sleep for at least 7 hours a day.  Smoking cessation instruction/counseling given:  counseled patient on the dangers of tobacco use, advised patient to stop smoking, and reviewed strategies to maximize success   - she is advised to maintain close follow up with Lindell Spar, MD for primary care needs, as well as her other providers for optimal and coordinated care. -filled out forms for her to be able to get diabetic shoes.     I spent 37 minutes in the care of the patient today including review of labs  from [place], Lipids, Thyroid Function, Hematology (current and previous including abstractions from other facilities); face-to-face time discussing  her blood glucose readings/logs, discussing hypoglycemia and hyperglycemia episodes and symptoms, medications doses, her options of short and long term treatment based on the latest standards of care / guidelines;  discussion about incorporating lifestyle medicine;  and documenting the encounter. Risk reduction counseling performed per USPSTF guidelines to reduce obesity and cardiovascular risk factors.     Please refer to Patient Instructions for Blood Glucose Monitoring and Insulin/Medications Dosing Guide"  in media tab for additional information. Please  also refer to " Patient Self Inventory" in the Media  tab for reviewed elements of pertinent patient history.  [name] participated in the discussions, expressed understanding, and voiced agreement with the above plans.  All questions were answered to her satisfaction. she is encouraged to contact clinic should she have any questions or concerns prior to her return visit.   Follow up plan: - Return in  about 3 months (around 03/24/2022) for Diabetes F/U with A1c in office, No previsit labs, Bring meter and logs.   [name], [facility] [address] [place], [place] [address] Phone: [phone] Fax: [phone]  12/23/2021, 2:37 PM

## 2021-12-29 ENCOUNTER — Encounter (HOSPITAL_COMMUNITY): Payer: Self-pay

## 2021-12-29 LAB — SURGICAL PATHOLOGY

## 2021-12-31 ENCOUNTER — Encounter: Payer: Self-pay | Admitting: Internal Medicine

## 2021-12-31 ENCOUNTER — Ambulatory Visit (INDEPENDENT_AMBULATORY_CARE_PROVIDER_SITE_OTHER): Payer: Medicaid Other | Admitting: Internal Medicine

## 2021-12-31 VITALS — BP 146/72 | HR 74 | Ht 61.0 in | Wt 103.0 lb

## 2021-12-31 DIAGNOSIS — Z23 Encounter for immunization: Secondary | ICD-10-CM

## 2021-12-31 DIAGNOSIS — G8191 Hemiplegia, unspecified affecting right dominant side: Secondary | ICD-10-CM

## 2021-12-31 DIAGNOSIS — Z0001 Encounter for general adult medical examination with abnormal findings: Secondary | ICD-10-CM | POA: Diagnosis not present

## 2021-12-31 DIAGNOSIS — Z89512 Acquired absence of left leg below knee: Secondary | ICD-10-CM | POA: Diagnosis not present

## 2021-12-31 DIAGNOSIS — I2510 Atherosclerotic heart disease of native coronary artery without angina pectoris: Secondary | ICD-10-CM

## 2021-12-31 DIAGNOSIS — N052 Unspecified nephritic syndrome with diffuse membranous glomerulonephritis: Secondary | ICD-10-CM | POA: Insufficient documentation

## 2021-12-31 DIAGNOSIS — E1059 Type 1 diabetes mellitus with other circulatory complications: Secondary | ICD-10-CM | POA: Diagnosis not present

## 2021-12-31 DIAGNOSIS — F411 Generalized anxiety disorder: Secondary | ICD-10-CM

## 2021-12-31 DIAGNOSIS — I10 Essential (primary) hypertension: Secondary | ICD-10-CM | POA: Diagnosis not present

## 2021-12-31 DIAGNOSIS — E440 Moderate protein-calorie malnutrition: Secondary | ICD-10-CM

## 2021-12-31 MED ORDER — MIRTAZAPINE 7.5 MG PO TABS: 7.5000 mg | ORAL_TABLET | Freq: Every day | ORAL | 0 refills | Status: DC

## 2021-12-31 MED ORDER — MISC. DEVICES MISC: 0 refills | Status: AC

## 2021-12-31 NOTE — Progress Notes (Signed)
Established Patient Office Visit  Subjective:  Patient ID: [name], female    DOB: [birth year]  Age: 51 y.o. MRN: [id number]  CC:  Chief Complaint  Patient presents with   Annual Exam    Patient would like a rx for a wheelchair cushion, and wants a referral to biotech for her leg     HPI [name] is a 51 y.o. 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 Her daughter was present during the visit.  She complains of chronic low back pain.  She has difficulty ambulating, and is mostly wheelchair-bound or bedridden.  Her daughter helps her with ADLs.  Does not have any sacral or pressure ulcer currently.  She is on oxycodone for chronic pain.  She also has radiating symptoms to RLE, but did not like gabapentin or amitriptyline for neuropathy.  She requests prescription for wheelchair cushion.  She also requests referral to prosthetic clinic.   She also complains of severe spells of anxiety and insomnia.  She also reports anhedonia, fatigue and lack of concentration.  Denies SI or HI.  She also reports loss of weight, but her daughter reports that she has very poor p.o. intake. She was given Remeron 15 U qHS, but had episodes of confusion with it. She agrees to try a lower dose to improve insomnia and appetite.   Type I DM: Her last HbA1C was 10.5, which is slightly better tan prior.  She has been taking [place] 10 units at bedtime and follows insulin sliding scale.  Her daughter checks her blood sugars before meals and at bedtime, which have been variable.  She has been following up with endocrinology for it.  She also has progressive CKD, last CMP reviewed.  She denies any dysuria, hematuria or urinary hesitancy.  She follows up with Dr. Theador Hawthorne.  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   Past Medical History:  Diagnosis Date   Acquired absence of left leg below knee ([place])    Anxiety    was on xanax for years but discontinued prior to prior PCP retiring as she was on opioids and was told she would not find someone to write for both   Arterial thromboembolism ([place])    Prior embolectomy, previously on Coumadin   Cataract    Phreesia 11/19/2019   Chronic kidney disease    Phreesia 11/19/2019   Clotting disorder ([place])    Phreesia 11/19/2019   Coronary atherosclerosis of native coronary artery    a. s/p DES to LAD in 2014   Depression    Depression    Phreesia 11/19/2019   Diabetes mellitus without complication ([place])    Phreesia 11/19/2019   Diabetic ketoacidosis with coma associated with type 1 diabetes mellitus ([place])    DKA, type 1 ([place]) 11/17/2016   Facial cellulitis    12/2010   Glomerulonephritis    Headache(784.0)    Hemiplegia, unspecified affecting right dominant side ([place])    History of stroke    Hyperlipidemia    Insulin dependent diabetes mellitus    History of diabetic ketoacidosis   Ischemic cardiomyopathy    a. EF 30-35% in 2018 b. EF at 45-50% by repeat imaging in 02/2019   Left carotid artery occlusion  Major depressive disorder, single episode, unspecified    Pancreatitis, acute 11/07/2016   Peripheral arterial disease ([place])    Shingles    11/2010   ST elevation myocardial infarction (STEMI) of anterior wall [facility])    Late presentation September 2014   Stroke [facility])    2018   Upper abdominal pain 06/03/2021    Past Surgical History:  Procedure Laterality Date   AMPUTATION  03/03/2011   Procedure: AMPUTATION DIGIT;  Surgeon: Newt Minion, MD;  Location: [place];  Service: Orthopedics;  Laterality: Left;  Left Foot Amputation 4th and 5th toes at MTP joint   AMPUTATION  04/22/2011   Procedure: AMPUTATION BELOW KNEE;  Surgeon: Newt Minion, MD;  Location:  [place];  Service: Orthopedics;  Laterality: Left;  Left Below Knee Amputation   BIOPSY  07/17/2021   Procedure: BIOPSY;  Surgeon: Eloise Harman, DO;  [facility];  Service: Endoscopy;;   CATARACT EXTRACTION W/PHACO Left 01/09/2020   Procedure: CATARACT EXTRACTION PHACO AND INTRAOCULAR LENS PLACEMENT (IOC);  Surgeon: Baruch Goldmann, MD;  Location[facility];  Service: Ophthalmology;  Laterality: Left;  CDE: 9.73   COLONOSCOPY WITH PROPOFOL N/A 12/14/2021   Procedure: COLONOSCOPY WITH PROPOFOL;  Surgeon: Eloise Harman, DO;  [facility];  Service: Endoscopy;  Laterality: N/A;  9:45 am   DILATION AND CURETTAGE OF UTERUS     EMBOLECTOMY  01/29/2011   Procedure: EMBOLECTOMY;  Surgeon: Mal Misty, MD;  Location: [facility] OR;  Service: Vascular;  Laterality: Left;  Left Popliteal and Tibial Embolectomy with patch angioplasty   ESOPHAGOGASTRODUODENOSCOPY (EGD) WITH PROPOFOL N/A 07/17/2021   Procedure: ESOPHAGOGASTRODUODENOSCOPY (EGD) WITH PROPOFOL;  Surgeon: Eloise Harman, DO;  [facility];  Service: Endoscopy;  Laterality: N/A;  1:00pm   INCISION AND DRAINAGE ABSCESS N/A 04/06/2016   Procedure: INCISION AND DRAINAGE ABSCESS;  Surgeon: Jonnie Kind, MD;  [facility];  Service: Gynecology;  Laterality: N/A;   LEFT HEART CATHETERIZATION WITH CORONARY ANGIOGRAM N/A 10/01/2012   Procedure: LEFT HEART CATHETERIZATION WITH CORONARY ANGIOGRAM;  Surgeon: Peter M Martinique, MD;  Location: [facility] CATH LAB;  Service: Cardiovascular;  Laterality: N/A;   LOWER EXTREMITY ANGIOGRAM N/A 01/27/2011   Procedure: LOWER EXTREMITY ANGIOGRAM;  Surgeon: Rosetta Posner, MD;  Location: [facility] CATH LAB;  Service: Cardiovascular;  Laterality: N/A;   LOWER EXTREMITY ANGIOGRAM N/A 01/28/2011   Procedure: LOWER EXTREMITY ANGIOGRAM;  Surgeon: Conrad DeWitt, MD;  Location: [facility] CATH LAB;  Service: Cardiovascular;  Laterality: N/A;   LOWER EXTREMITY ANGIOGRAM Left 01/29/2011   Procedure: LOWER EXTREMITY  ANGIOGRAM;  Surgeon: Mal Misty, MD;  Location: [facility] CATH LAB;  Service: Cardiovascular;  Laterality: Left;   MULTIPLE TOOTH EXTRACTIONS     POLYPECTOMY  12/14/2021   Procedure: POLYPECTOMY;  Surgeon: Eloise Harman, DO;  [facility];  Service: Endoscopy;;   TEE WITHOUT CARDIOVERSION  04/15/2011   Procedure: TRANSESOPHAGEAL ECHOCARDIOGRAM (TEE);  Surgeon: [name], MD;  [facility];  Service: Cardiovascular;  Laterality: N/A;   WRIST SURGERY     Left    Family History  Problem Relation Age of Onset   COPD Mother    Hypertension Mother    Diabetes Mother    Stroke Mother    Coronary artery disease Mother    Diabetes Father    Hypertension Father    Coronary artery disease Father    Kidney disease Father  on dialysis, died from heart attack while being dialzed   Pancreatitis Maternal Aunt    Cancer Maternal Aunt        unknown primary   Anesthesia problems Neg Hx    Colon cancer Neg Hx     Social History   Socioeconomic History   Marital status: Single    Spouse name: Not on file   Number of children: Not on file   Years of education: Not on file   Highest education level: Not on file  Occupational History   Not on file  Tobacco Use   Smoking status: Every Day    Packs/day: 0.50    Years: 21.00    Total pack years: 10.50    Types: Cigarettes    Start date: 07/06/1989   Smokeless tobacco: Never  Vaping Use   Vaping Use: Never used  Substance and Sexual Activity   Alcohol use: No    Alcohol/week: 0.0 standard drinks of alcohol    Comment: rarely   Drug use: Not Currently    Frequency: 1.0 times per week    Types: Marijuana   Sexual activity: Yes    Partners: Male    Comment: pt stated that her tubal was only 50% effective- "not cut and burned"  Other Topics Concern   Not on file  Social History Narrative   Unemployed.  Lives in [place] with [place], [place], and mother.  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    Social Determinants of Health   Financial Resource Strain: Not on file  Food Insecurity: Not on file  Transportation Needs: Not on file  Physical Activity: Not on file  Stress: Not on file  Social Connections: Not on file  Intimate Partner Violence: Not on file    Outpatient Medications Prior to Visit  Medication Sig Dispense Refill   acetaminophen (TYLENOL) 500 MG tablet Take 500 mg by mouth every 6 (six) hours as needed for moderate pain.     apixaban (ELIQUIS) 2.5 MG TABS tablet Take 1 tablet (2.5 mg total) by mouth 2 (two) times daily. 180 tablet 3   atorvastatin (LIPITOR) 80 MG tablet Take 1 tablet (80 mg total) by mouth daily. (Patient taking differently: Take 40 mg by mouth 2 (two) times daily.) 90 tablet 3   calcium carbonate (TUMS EX) 750 MG chewable tablet Chew 1 tablet by mouth daily as needed for heartburn.     carvedilol (COREG) 6.25 MG tablet Take 1 tablet (6.25 mg total) by mouth 2 (two) times daily with a meal. 180 tablet 3   cetirizine (ZYRTEC) 10 MG tablet Take 10 mg by mouth daily as needed for allergies.     glucose blood (ACCU-CHEK GUIDE) test strip Use as instructed to monitor glucose 4 times daily, before meals and before bed. 400 each 12   hydrALAZINE (APRESOLINE) 25 MG tablet Take 0.5 tablets (12.5 mg total) by mouth 2 (two) times daily. 90 tablet 3   insulin aspart (NOVOLOG) 100 UNIT/ML FlexPen Inject 1-7 Units into the skin 3 (three) times daily with meals. 15 mL 3   Insulin Pen Needle (UNIFINE PENTIPS) 31G X 8 MM MISC USE TO INJECT INSULIN SUBCUTANEOUSLY . USE FOR BOTH SLIDING SCALE AND FOR DAILYINSULIN DOSES. 100 each 11   linaclotide (LINZESS) 145 MCG CAPS capsule Take 1 capsule (145 mcg total) by mouth daily before breakfast. 30 capsule 5   nitroGLYCERIN (NITROSTAT) 0.4 MG SL tablet Place 1 tablet (0.4 mg total) under the tongue every 5 (  five) minutes as needed for chest pain. 25 tablet 3   omeprazole (PRILOSEC) 20 MG capsule Take 1 capsule (20 mg total) by  mouth daily. 30 capsule 3   ondansetron (ZOFRAN) 4 MG tablet TAKE ONE TABLET BY MOUTH (4MG) EVERY 4 TO 6 HOURS AS NEEDED FOR NAUSEA OR VOMITING 90 tablet 1   oxyCODONE-acetaminophen (PERCOCET) 10-325 MG tablet Take 0.5 tablets every 4 (four) hours as needed by mouth for pain. (Patient taking differently: Take 1 tablet by mouth every 4 (four) hours as needed for pain.) 120 tablet 0   simethicone (GAS-X) 80 MG chewable tablet Chew 80 mg by mouth every 6 (six) hours as needed for flatulence.     TRESIBA FLEXTOUCH 100 UNIT/ML FlexTouch Pen Inject 10 Units into the skin at bedtime. 9 mL 3   mirtazapine (REMERON) 15 MG tablet Take 1 tablet (15 mg total) by mouth at bedtime. (Patient not taking: Reported on 12/01/2021) 30 tablet 5   No facility-administered medications prior to visit.    Allergies  Allergen Reactions   Losartan Swelling   Mirtazapine     Dizziness/confusion    Nsaids     Kidney disease    ROS Review of Systems  Constitutional:  Negative for chills and fever.  HENT:  Negative for congestion, sinus pressure, sinus pain and sore throat.   Eyes:  Positive for visual disturbance. Negative for pain and discharge.  Respiratory:  Negative for cough and shortness of breath.   Cardiovascular:  Negative for chest pain and palpitations.  Gastrointestinal:  Negative for abdominal pain, nausea and vomiting.  Endocrine: Negative for polydipsia and polyuria.  Genitourinary:  Negative for dysuria and hematuria.  Musculoskeletal:  Positive for arthralgias and back pain. Negative for neck pain and neck stiffness.  Skin:  Negative for rash.  Neurological:  Negative for dizziness and weakness.  Psychiatric/Behavioral:  Negative for agitation and behavioral problems. The patient is nervous/anxious.       Objective:    Physical Exam Vitals reviewed.  Constitutional:      General: She is not in acute distress.    Appearance: She is not diaphoretic.     Comments: In wheelchair  HENT:      Head: Normocephalic and atraumatic.     Nose: Nose normal.     Mouth/Throat:     Mouth: Mucous membranes are moist.  Eyes:     General: No scleral icterus.    Extraocular Movements: Extraocular movements intact.  Cardiovascular:     Rate and Rhythm: Normal rate and regular rhythm.     Pulses: Normal pulses.     Heart sounds: Normal heart sounds. No murmur heard. Pulmonary:     Breath sounds: Normal breath sounds. No wheezing or rales.  Musculoskeletal:     Cervical back: Neck supple. No tenderness.     Comments: S/p left BKA, prosthesis in place  Skin:    General: Skin is warm.  Neurological:     General: No focal deficit present.     Mental Status: She is alert and oriented to person, place, and time.     Sensory: Sensory deficit present.     Motor: Weakness (Right LE) present.  Psychiatric:        Mood and Affect: Mood normal.        Behavior: Behavior normal.     BP (!) 146/72 (BP Location: Left Arm)   Pulse 74   Ht _0  (1.549 m)   Wt 103 lb (46.7 kg)  LMP 12/28/2010   SpO2 98%   BMI 19.46 kg/m  Wt Readings from Last 3 Encounters:  12/31/21 103 lb (46.7 kg)  12/23/21 104 lb (47.2 kg)  12/14/21 101 lb 6.6 oz (46 kg)    Lab Results  Component Value Date   TSH 3.070 04/09/2021   Lab Results  Component Value Date   WBC 7.6 12/03/2021   HGB 12.0 12/03/2021   HCT 37.1 12/03/2021   MCV 94.4 12/03/2021   PLT 240 12/03/2021   Lab Results  Component Value Date   NA 137 12/08/2021   K 5.4 (H) 12/08/2021   CO2 27 12/08/2021   GLUCOSE 172 (H) 12/08/2021   BUN 38 (H) 12/08/2021   CREATININE 2.70 (H) 12/08/2021   BILITOT 0.3 05/01/2021   ALKPHOS 100 05/01/2021   AST 14 05/01/2021   ALT 19 05/01/2021   PROT 6.1 05/01/2021   ALBUMIN 3.6 (L) 05/01/2021   CALCIUM 8.2 (L) 12/08/2021   ANIONGAP 4 (L) 12/08/2021   EGFR 26 (L) 04/09/2021   Lab Results  Component Value Date   CHOL 151 04/09/2021   Lab Results  Component Value Date   HDL 59 04/09/2021    Lab Results  Component Value Date   LDLCALC 77 04/09/2021   Lab Results  Component Value Date   TRIG 75 04/09/2021   Lab Results  Component Value Date   CHOLHDL 2.6 04/09/2021   Lab Results  Component Value Date   HGBA1C 10.5 (A) 12/23/2021      Assessment & Plan:   Problem List Items Addressed This Visit       Cardiovascular and Mediastinum   Coronary artery disease involving native heart without angina pectoris    S/p stent to LAD Not on any antiplatelet, on Eliquis Follows up with Cardiologist      Relevant Orders   Lipid Profile   TSH   Essential hypertension    BP Readings from Last 1 Encounters:  12/31/21 (!) 146/72  Elevated today as she has not had hydralazine today Usually well-controlled at home Counseled for compliance with the medications Advised DASH diet      Relevant Orders   TSH     Endocrine   Diabetes mellitus type I ([place])    Lab Results  Component Value Date   HGBA1C 10.5 (A) 12/23/2021  On [place] 10 U qHS and ISS, followed by Endocrinology Advised to follow diabetic diet On statin Diabetic eye exam: In 10/2019, no retinopathy      Relevant Medications   Misc. Devices MISC     Nervous and Auditory   Hemiplegia, unspecified affecting right dominant side ([place])    S/p CVA Has left BKA Wheelchair-bound currently, prescription for wheelchair cushion faxed      Relevant Medications   Misc. Devices MISC     Genitourinary   Membranous glomerulonephritis    Has history of diabetic CKD Recent renal biopsy showed membranous glomerulopathy, needs to discuss with Nephrology        Other   GAD (generalized anxiety disorder)    Considering her weight loss due to lack of appetite, started Remeron at a lower dose now Her chronic medical conditions are also contributing to her depression and anxiety Did not tolerate Elavil      Relevant Medications   mirtazapine (REMERON) 7.5 MG tablet   Hx of BKA, left (H[facility])    For history  of PVD Referred to prosthetic clinic for adjustment of prosthetic and she has been elevating prosthetic  and has difficulty moving her leg      Relevant Medications   Misc. Devices MISC   Other Relevant Orders   Ambulatory referral to Orthopedic Surgery   Encounter for general adult medical examination with abnormal findings - Primary    Physical exam as documented. Fasting blood tests ordered. Flu vaccine today, denies Shingrix vaccine today.      Other Visit Diagnoses     Moderate protein-calorie malnutrition ([place])       Relevant Medications   mirtazapine (REMERON) 7.5 MG tablet   Misc. Devices MISC   Other Relevant Orders   TSH   Need for immunization against influenza       Relevant Orders   Flu Vaccine QUAD 21moIM (Fluarix, Fluzone & Alfiuria Quad PF) (Completed)       Meds ordered this encounter  Medications   mirtazapine (REMERON) 7.5 MG tablet    Sig: Take 1 tablet (7.5 mg total) by mouth at bedtime.    Dispense:  30 tablet    Refill:  0   Misc. Devices MISC    Sig: Wheelchair cushion - small.    Dispense:  1 each    Refill:  0    Follow-up: Return in about 4 months (around 05/02/2022) for DM and weight check.    RLindell Spar MD

## 2021-12-31 NOTE — Progress Notes (Incomplete)
Established Patient Office Visit  Subjective:  Patient ID: [name], female    DOB: [birth year]  Age: 51 y.o. MRN: [id number]  CC:  Chief Complaint  Patient presents with  . Annual Exam    Patient would like a rx for a wheelchair cushion, and wants a referral to biotech for her leg     HPI [name] is a 51 y.o. 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 Her daughter was present during the visit.  She complains of chronic low back pain.  She has difficulty ambulating, and is mostly wheelchair-bound or bedridden.  Her daughter helps her with ADLs.  Does not have any sacral or pressure ulcer currently.  She is on oxycodone for chronic pain.  She also has radiating symptoms to RLE, but did not like gabapentin or amitriptyline for neuropathy.  She requests prescription for wheelchair cushion.  She also requests referral to prosthetic clinic.   She also complains of severe spells of anxiety and insomnia.  She also reports anhedonia, fatigue and lack of concentration.  Denies SI or HI.  She also reports loss of weight, but her daughter reports that she has very poor p.o. intake.   Type I DM: Her last HbA1C was 10.5, which is slightly better tan prior.  She has been taking [place] 10 units at bedtime and follows insulin sliding scale.  Her daughter checks her blood sugars before meals and at bedtime, which have been variable.  She has been following up with endocrinology for it.  She also has progressive CKD, last CMP reviewed.  She denies any dysuria, hematuria or urinary hesitancy.  She follows up with Dr. Theador Hawthorne.  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   Past Medical History:  Diagnosis Date  .  Acquired absence of left leg below knee (HCC)   . Anxiety    was on xanax for years but discontinued prior to prior PCP retiring as she was on opioids and was told she would not find someone to write for both  . 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 Cataract    Phreesia 11/19/2019  . Chronic kidney disease    Phreesia 11/19/2019  . Clotting disorder ([place])    Phreesia 11/19/2019  . Coronary atherosclerosis of native coronary artery    a. s/p DES to LAD in 2014  . Depression   . Depression    Phreesia 11/19/2019  . Diabetes mellitus without complication ([place])    Phreesia 11/19/2019  . Diabetic ketoacidosis with coma associated with type 1 diabetes mellitus ([place])   . DKA, type 1 ([place]) 11/17/2016  . Facial cellulitis    12/2010  . Glomerulonephritis   . Headache(784.0)   . Hemiplegia, unspecified affecting right dominant side ([place])   . History of stroke   . Hyperlipidemia   . Insulin dependent diabetes mellitus    History of diabetic ketoacidosis  . Ischemic cardiomyopathy    a. EF 30-35% in 2018 b. EF at 45-50% by repeat imaging in 02/2019  . Left carotid artery occlusion   . Major depressive disorder, single episode, unspecified   . Pancreatitis, acute 11/07/2016  . Peripheral arterial disease ([place])   . Shingles  11/2010  . ST elevation myocardial infarction (STEMI) of anterior wall [facility])    Late presentation September 2014  . Stroke ([place])    2018  . Upper abdominal pain 06/03/2021    Past Surgical History:  Procedure Laterality Date  . AMPUTATION  03/03/2011   Procedure: AMPUTATION DIGIT;  Surgeon: Newt Minion, MD;  Location: [facility];  Service: Orthopedics;  Laterality: Left;  Left Foot Amputation 4th and 5th toes at MTP joint  . AMPUTATION  04/22/2011   Procedure: AMPUTATION BELOW KNEE;  Surgeon: Newt Minion, MD;  Location: [facility];  Service: Orthopedics;  Laterality: Left;  Left Below Knee Amputation  . BIOPSY  07/17/2021    Procedure: BIOPSY;  Surgeon: Eloise Harman, DO;  [facility];  Service: Endoscopy;;  . CATARACT EXTRACTION W/PHACO Left 01/09/2020   Procedure: CATARACT EXTRACTION PHACO AND INTRAOCULAR LENS PLACEMENT (IOC);  Surgeon: Baruch Goldmann, MD;  [facility]: [facility];  Service: Ophthalmology;  Laterality: Left;  CDE: 9.73  . COLONOSCOPY WITH PROPOFOL N/A 12/14/2021   Procedure: COLONOSCOPY WITH PROPOFOL;  Surgeon: Eloise Harman, DO;  [facility];  Service: Endoscopy;  Laterality: N/A;  9:45 am  . DILATION AND CURETTAGE OF UTERUS    . EMBOLECTOMY  01/29/2011   Procedure: EMBOLECTOMY;  Surgeon: Mal Misty, MD;  Location: [facility];  Service: Vascular;  Laterality: Left;  Left Popliteal and Tibial Embolectomy with patch angioplasty  . ESOPHAGOGASTRODUODENOSCOPY (EGD) WITH PROPOFOL N/A 07/17/2021   Procedure: ESOPHAGOGASTRODUODENOSCOPY (EGD) WITH PROPOFOL;  Surgeon: Eloise Harman, DO;  [facility];  Service: Endoscopy;  Laterality: N/A;  1:00pm  . INCISION AND DRAINAGE ABSCESS N/A 04/06/2016   Procedure: INCISION AND DRAINAGE ABSCESS;  Surgeon: Jonnie Kind, MD;  [facility];  Service: Gynecology;  Laterality: N/A;  . LEFT HEART CATHETERIZATION WITH CORONARY ANGIOGRAM N/A 10/01/2012   Procedure: LEFT HEART CATHETERIZATION WITH CORONARY ANGIOGRAM;  Surgeon: Peter M Martinique, MD;  Location: [facility] CATH LAB;  Service: Cardiovascular;  Laterality: N/A;  . LOWER EXTREMITY ANGIOGRAM N/A 01/27/2011   Procedure: LOWER EXTREMITY ANGIOGRAM;  Surgeon: Rosetta Posner, MD;  Location: [facility] CATH LAB;  Service: Cardiovascular;  Laterality: N/A;  . LOWER EXTREMITY ANGIOGRAM N/A 01/28/2011   Procedure: LOWER EXTREMITY ANGIOGRAM;  Surgeon: Conrad Hurtsboro, MD;  Location: [facility] CATH LAB;  Service: Cardiovascular;  Laterality: N/A;  . LOWER EXTREMITY ANGIOGRAM Left 01/29/2011   Procedure: LOWER EXTREMITY ANGIOGRAM;  Surgeon: Mal Misty, MD;  Location: [facility] CATH LAB;  Service: Cardiovascular;   Laterality: Left;  Marland Kitchen MULTIPLE TOOTH EXTRACTIONS    . POLYPECTOMY  12/14/2021   Procedure: POLYPECTOMY;  Surgeon: Eloise Harman, DO;  [facility];  Service: Endoscopy;;  . TEE WITHOUT CARDIOVERSION  04/15/2011   Procedure: TRANSESOPHAGEAL ECHOCARDIOGRAM (TEE);  Surgeon: [name], MD;  [facility];  Service: Cardiovascular;  Laterality: N/A;  . WRIST SURGERY     Left    Family History  Problem Relation Age of Onset  . COPD Mother   . Hypertension Mother   . Diabetes Mother   . Stroke Mother   . Coronary artery disease Mother   . Diabetes Father   . Hypertension Father   . Coronary artery disease Father   . Kidney disease Father        on dialysis, died from heart attack while being dialzed  . Pancreatitis Maternal Aunt   . Cancer Maternal Aunt  unknown primary  . Anesthesia problems Neg Hx   . Colon cancer Neg Hx     Social History   Socioeconomic History  . Marital status: Single    Spouse name: Not on file  . Number of children: Not on file  . Years of education: Not on file  . Highest education level: Not on file  Occupational History  . Not on file  Tobacco Use  . Smoking status: Every Day    Packs/day: 0.50    Years: 21.00    Total pack years: 10.50    Types: Cigarettes    Start date: 07/06/1989  . Smokeless tobacco: Never  Vaping Use  . Vaping Use: Never used  Substance and Sexual Activity  . Alcohol use: No    Alcohol/week: 0.0 standard drinks of alcohol    Comment: rarely  . Drug use: Not Currently    Frequency: 1.0 times per week    Types: Marijuana  . Sexual activity: Yes    Partners: Male    Comment: pt stated that her tubal was only 50% effective- "not cut and burned"  Other Topics Concern  . Not on file  Social History Narrative   Unemployed.  Lives in [place] with [place], [place], and mother.  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   Social Determinants of Health   Financial Resource Strain:  Not on file  Food Insecurity: Not on file  Transportation Needs: Not on file  Physical Activity: Not on file  Stress: Not on file  Social Connections: Not on file  Intimate Partner Violence: Not on file    Outpatient Medications Prior to Visit  Medication Sig Dispense Refill  . acetaminophen (TYLENOL) 500 MG tablet Take 500 mg by mouth every 6 (six) hours as needed for moderate pain.    Marland Kitchen apixaban (ELIQUIS) 2.5 MG TABS tablet Take 1 tablet (2.5 mg total) by mouth 2 (two) times daily. 180 tablet 3  . atorvastatin (LIPITOR) 80 MG tablet Take 1 tablet (80 mg total) by mouth daily. (Patient taking differently: Take 40 mg by mouth 2 (two) times daily.) 90 tablet 3  . calcium carbonate (TUMS EX) 750 MG chewable tablet Chew 1 tablet by mouth daily as needed for heartburn.    . carvedilol (COREG) 6.25 MG tablet Take 1 tablet (6.25 mg total) by mouth 2 (two) times daily with a meal. 180 tablet 3  . cetirizine (ZYRTEC) 10 MG tablet Take 10 mg by mouth daily as needed for allergies.    Marland Kitchen glucose blood (ACCU-CHEK GUIDE) test strip Use as instructed to monitor glucose 4 times daily, before meals and before bed. 400 each 12  . hydrALAZINE (APRESOLINE) 25 MG tablet Take 0.5 tablets (12.5 mg total) by mouth 2 (two) times daily. 90 tablet 3  . insulin aspart (NOVOLOG) 100 UNIT/ML FlexPen Inject 1-7 Units into the skin 3 (three) times daily with meals. 15 mL 3  . Insulin Pen Needle (UNIFINE PENTIPS) 31G X 8 MM MISC USE TO INJECT INSULIN SUBCUTANEOUSLY . USE FOR BOTH SLIDING SCALE AND FOR DAILYINSULIN DOSES. 100 each 11  . linaclotide (LINZESS) 145 MCG CAPS capsule Take 1 capsule (145 mcg total) by mouth daily before breakfast. 30 capsule 5  . mirtazapine (REMERON) 15 MG tablet Take 1 tablet (15 mg total) by mouth at bedtime. (Patient not taking: Reported on 12/01/2021) 30 tablet 5  . nitroGLYCERIN (NITROSTAT) 0.4 MG SL tablet Place 1 tablet (0.4 mg total) under the tongue every 5 (five)  minutes as needed for  chest pain. 25 tablet 3  . omeprazole (PRILOSEC) 20 MG capsule Take 1 capsule (20 mg total) by mouth daily. 30 capsule 3  . ondansetron (ZOFRAN) 4 MG tablet TAKE ONE TABLET BY MOUTH (4MG) EVERY 4 TO 6 HOURS AS NEEDED FOR NAUSEA OR VOMITING 90 tablet 1  . oxyCODONE-acetaminophen (PERCOCET) 10-325 MG tablet Take 0.5 tablets every 4 (four) hours as needed by mouth for pain. (Patient taking differently: Take 1 tablet by mouth every 4 (four) hours as needed for pain.) 120 tablet 0  . simethicone (GAS-X) 80 MG chewable tablet Chew 80 mg by mouth every 6 (six) hours as needed for flatulence.    Tyler Aas FLEXTOUCH 100 UNIT/ML FlexTouch Pen Inject 10 Units into the skin at bedtime. 9 mL 3   No facility-administered medications prior to visit.    Allergies  Allergen Reactions  . Losartan Swelling  . Mirtazapine     Dizziness/confusion   . Nsaids     Kidney disease    ROS Review of Systems  Constitutional:  Negative for chills and fever.  HENT:  Negative for congestion, sinus pressure, sinus pain and sore throat.   Eyes:  Positive for visual disturbance. Negative for pain and discharge.  Respiratory:  Negative for cough and shortness of breath.   Cardiovascular:  Negative for chest pain and palpitations.  Gastrointestinal:  Negative for abdominal pain, nausea and vomiting.  Endocrine: Negative for polydipsia and polyuria.  Genitourinary:  Negative for dysuria and hematuria.  Musculoskeletal:  Positive for arthralgias and back pain. Negative for neck pain and neck stiffness.  Skin:  Negative for rash.  Neurological:  Negative for dizziness and weakness.  Psychiatric/Behavioral:  Negative for agitation and behavioral problems. The patient is nervous/anxious.       Objective:    Physical Exam Vitals reviewed.  Constitutional:      General: She is not in acute distress.    Appearance: She is not diaphoretic.     Comments: In wheelchair  HENT:     Head: Normocephalic and atraumatic.      Nose: Nose normal.     Mouth/Throat:     Mouth: Mucous membranes are moist.  Eyes:     General: No scleral icterus.    Extraocular Movements: Extraocular movements intact.  Cardiovascular:     Rate and Rhythm: Normal rate and regular rhythm.     Pulses: Normal pulses.     Heart sounds: Normal heart sounds. No murmur heard. Pulmonary:     Breath sounds: Normal breath sounds. No wheezing or rales.  Musculoskeletal:     Cervical back: Neck supple. No tenderness.     Comments: S/p left BKA, prosthesis in place  Skin:    General: Skin is warm.  Neurological:     General: No focal deficit present.     Mental Status: She is alert and oriented to person, place, and time.     Sensory: Sensory deficit present.     Motor: Weakness (Right LE) present.  Psychiatric:        Mood and Affect: Mood normal.        Behavior: Behavior normal.     BP (!) 153/83 (BP Location: Right Arm, Patient Position: Sitting, Cuff Size: Normal)   Pulse 74   Ht _0  (1.549 m)   Wt 103 lb (46.7 kg)   LMP 12/28/2010   SpO2 98%   BMI 19.46 kg/m  Wt Readings from Last 3 Encounters:  12/31/21  103 lb (46.7 kg)  12/23/21 104 lb (47.2 kg)  12/14/21 101 lb 6.6 oz (46 kg)    Lab Results  Component Value Date   TSH 3.070 04/09/2021   Lab Results  Component Value Date   WBC 7.6 12/03/2021   HGB 12.0 12/03/2021   HCT 37.1 12/03/2021   MCV 94.4 12/03/2021   PLT 240 12/03/2021   Lab Results  Component Value Date   NA 137 12/08/2021   K 5.4 (H) 12/08/2021   CO2 27 12/08/2021   GLUCOSE 172 (H) 12/08/2021   BUN 38 (H) 12/08/2021   CREATININE 2.70 (H) 12/08/2021   BILITOT 0.3 05/01/2021   ALKPHOS 100 05/01/2021   AST 14 05/01/2021   ALT 19 05/01/2021   PROT 6.1 05/01/2021   ALBUMIN 3.6 (L) 05/01/2021   CALCIUM 8.2 (L) 12/08/2021   ANIONGAP 4 (L) 12/08/2021   EGFR 26 (L) 04/09/2021   Lab Results  Component Value Date   CHOL 151 04/09/2021   Lab Results  Component Value Date   HDL 59  04/09/2021   Lab Results  Component Value Date   LDLCALC 77 04/09/2021   Lab Results  Component Value Date   TRIG 75 04/09/2021   Lab Results  Component Value Date   CHOLHDL 2.6 04/09/2021   Lab Results  Component Value Date   HGBA1C 10.5 (A) 12/23/2021      Assessment & Plan:   Problem List Items Addressed This Visit   None Visit Diagnoses     Encounter for general adult medical examination with abnormal findings    -  Primary       No orders of the defined types were placed in this encounter.   Follow-up: No follow-ups on file.    Lindell Spar, MD

## 2021-12-31 NOTE — Assessment & Plan Note (Addendum)
BP Readings from Last 1 Encounters:  12/31/21 (!) 146/72   Elevated today as she has not had hydralazine today Usually well-controlled at home Counseled for compliance with the medications Advised DASH diet

## 2021-12-31 NOTE — Patient Instructions (Signed)
Please start taking Mirtazepine as prescribed.  Please continue to follow low carb diet.

## 2021-12-31 NOTE — Assessment & Plan Note (Signed)
Lab Results  Component Value Date   HGBA1C 10.5 (A) 12/23/2021   On [place] 10 U qHS and ISS, followed by Endocrinology Advised to follow diabetic diet On statin Diabetic eye exam: In 10/2019, no retinopathy

## 2022-01-01 ENCOUNTER — Encounter: Payer: Self-pay | Admitting: Internal Medicine

## 2022-01-01 NOTE — Assessment & Plan Note (Signed)
Physical exam as documented. Fasting blood tests ordered. Flu vaccine today, denies Shingrix vaccine today.

## 2022-01-01 NOTE — Assessment & Plan Note (Signed)
Considering her weight loss due to lack of appetite, started Remeron at a lower dose now Her chronic medical conditions are also contributing to her depression and anxiety Did not tolerate Elavil

## 2022-01-01 NOTE — Assessment & Plan Note (Addendum)
Has history of diabetic CKD Recent renal biopsy showed membranous glomerulopathy, needs to discuss with Nephrology

## 2022-01-01 NOTE — Assessment & Plan Note (Signed)
S/p CVA Has left BKA Wheelchair-bound currently, prescription for wheelchair cushion faxed

## 2022-01-01 NOTE — Assessment & Plan Note (Addendum)
For history of PVD Referred to prosthetic clinic for adjustment of prosthetic and she has been elevating prosthetic and has difficulty moving her leg

## 2022-01-01 NOTE — Assessment & Plan Note (Signed)
S/p stent to LAD Not on any antiplatelet, on Eliquis Follows up with Cardiologist

## 2022-01-07 ENCOUNTER — Other Ambulatory Visit: Payer: Self-pay | Admitting: Student

## 2022-01-07 NOTE — Telephone Encounter (Signed)
Prescription refill request for Eliquis received. hx: hypercoagulable state (11/06/2020), stroke Last office visit: 11/05/2021, [place] Scr:2.30, 12/08/2021 Age: 51 yo  Weight: 46.7 kg   Refill sent.

## 2022-02-04 ENCOUNTER — Telehealth: Payer: Self-pay | Admitting: Gastroenterology

## 2022-02-04 ENCOUNTER — Encounter: Payer: Self-pay | Admitting: Gastroenterology

## 2022-02-04 ENCOUNTER — Ambulatory Visit (INDEPENDENT_AMBULATORY_CARE_PROVIDER_SITE_OTHER): Payer: Medicaid Other | Admitting: Gastroenterology

## 2022-02-04 ENCOUNTER — Ambulatory Visit (HOSPITAL_COMMUNITY)
Admission: RE | Admit: 2022-02-04 | Discharge: 2022-02-04 | Disposition: A | Discharge status: Home / Self Care | Payer: Medicaid Other | Source: Ambulatory Visit | Attending: Gastroenterology | Admitting: Gastroenterology

## 2022-02-04 ENCOUNTER — Encounter (HOSPITAL_COMMUNITY): Payer: Self-pay

## 2022-02-04 VITALS — BP 154/81 | HR 88 | Temp 97.3°F | Ht 61.0 in | Wt 97.8 lb

## 2022-02-04 DIAGNOSIS — R109 Unspecified abdominal pain: Secondary | ICD-10-CM

## 2022-02-04 DIAGNOSIS — R1031 Right lower quadrant pain: Secondary | ICD-10-CM | POA: Insufficient documentation

## 2022-02-04 MED ORDER — OMEPRAZOLE 20 MG PO CPDR: 20.0000 mg | DELAYED_RELEASE_CAPSULE | Freq: Two times a day (BID) | ORAL | 3 refills | Status: DC

## 2022-02-04 MED ORDER — IOHEXOL 9 MG/ML PO SOLN
ORAL | Status: AC
  Filled 2022-02-04: qty 1000

## 2022-02-04 NOTE — Telephone Encounter (Signed)
I have increased omeprazole to BID and sent prescription. Please let patient know. Thanks!

## 2022-02-04 NOTE — Patient Instructions (Signed)
Please have blood work done today.  We are ordering a stat CT scan!  I will refer you to Surgery once I review all labs and CT and make sure nothing acute is going on.  Please go to the emergency room if worsening pain, fever, chills, or unable to tolerate oral intake.   It was a pleasure to see you today. I want to create trusting relationships with patients and provide genuine, compassionate, and quality care. I truly value your feedback, so please be on the lookout for a survey regarding your visit with me today. I appreciate your time in completing this!    Annitta Needs, PhD, ANP-BC [facility]

## 2022-02-04 NOTE — Progress Notes (Signed)
Gastroenterology Office Note     Primary Care Physician:  Lindell Spar, MD  Primary Gastroenterologist: Dr. Abbey Chatters    Chief Complaint   Chief Complaint  Patient presents with   Follow-up    Abd pain, not eating, vomiting     History of Present Illness   [name] is a 52 y.o. female presenting today in follow-up with a history of chronic abdominal pain, N/V, constipation, idiopathic pancreatitis in 2018 with glucose greater than 500, MRCP 2018 with severe motion degradation. Gallbladder still present, as she was unable to follow through with cholecystectomy recommendations due to stroke.    Noting right-sided abdominal pain upper and lower, chronic, worsening. Over the past week hasn't eaten much of anything. Eating just crackers and animal cookies. Stomach feels full and tight. Drank Ensure and vomited. Drinking small amount of ginger ale. Urine is not dark. Drinking fairlife milk, 2 cups a day. Worsening postprandial abdominal pain. Spicy foods and meats will worsen symptoms. Will tolerate potatoes. Intermittent vomiting every other day at least once.   Omeprazole once daily. Eating Tums for relief.  Zofran 4 mg without improvement.    Linzess 145 mcg capsules have helped with constipation. Going every other day. Used to be once a week.   Last A1c 10.5 in Dec 2023. Blood sugars have been in the 400s a few times this week.   Last endoscopic procedures:   Colonoscopy Nov 2023: non-bleeding internal hemorrhoids, one 8 mm polyp in sigmoid (sessile serrated adenoma without dysplasia)    EGD June 2023 due to N/V: gastritis s/p biopsy     Past Medical History:  Diagnosis Date   Acquired absence of left leg below knee ([place])    Anxiety    was on xanax for years but discontinued prior to prior PCP retiring as she was on opioids and was told she would not find someone to write for both   Arterial thromboembolism ([place])    Prior embolectomy, previously on  Coumadin   Cataract    Phreesia 11/19/2019   Chronic kidney disease    Phreesia 11/19/2019   Clotting disorder ([place])    Phreesia 11/19/2019   Coronary atherosclerosis of native coronary artery    a. s/p DES to LAD in 2014   Depression    Depression    Phreesia 11/19/2019   Diabetes mellitus without complication ([place])    Phreesia 11/19/2019   Diabetic ketoacidosis with coma associated with type 1 diabetes mellitus ([place])    DKA, type 1 ([place]) 11/17/2016   Facial cellulitis    12/2010   Glomerulonephritis    Headache(784.0)    Hemiplegia, unspecified affecting right dominant side ([place])    History of stroke    Hyperlipidemia    Insulin dependent diabetes mellitus    History of diabetic ketoacidosis   Ischemic cardiomyopathy    a. EF 30-35% in 2018 b. EF at 45-50% by repeat imaging in 02/2019   Left carotid artery occlusion    Major depressive disorder, single episode, unspecified    Pancreatitis, acute 11/07/2016   Peripheral arterial disease ([place])    Shingles    11/2010   ST elevation myocardial infarction (STEMI) of anterior wall [facility])    Late presentation September 2014   Stroke [facility])    2018   Upper abdominal pain 06/03/2021    Past Surgical History:  Procedure Laterality Date   AMPUTATION  03/03/2011   Procedure: AMPUTATION DIGIT;  Surgeon: Illene Regulus  Sharol Given, MD;  Location: [place];  Service: Orthopedics;  Laterality: Left;  Left Foot Amputation 4th and 5th toes at MTP joint   AMPUTATION  04/22/2011   Procedure: AMPUTATION BELOW KNEE;  Surgeon: Newt Minion, MD;  Location: [place];  Service: Orthopedics;  Laterality: Left;  Left Below Knee Amputation   BIOPSY  07/17/2021   Procedure: BIOPSY;  Surgeon: Eloise Harman, DO;  [facility];  Service: Endoscopy;;   CATARACT EXTRACTION W/PHACO Left 01/09/2020   Procedure: CATARACT EXTRACTION PHACO AND INTRAOCULAR LENS PLACEMENT (IOC);  Surgeon: Baruch Goldmann, MD;  Location: [facility];  Service: Ophthalmology;  Laterality:  Left;  CDE: 9.73   COLONOSCOPY WITH PROPOFOL N/A 12/14/2021   Procedure: COLONOSCOPY WITH PROPOFOL;  Surgeon: Eloise Harman, DO;  [facility];  Service: Endoscopy;  Laterality: N/A;  9:45 am   DILATION AND CURETTAGE OF UTERUS     EMBOLECTOMY  01/29/2011   Procedure: EMBOLECTOMY;  Surgeon: Mal Misty, MD;  Location: [facility] OR;  Service: Vascular;  Laterality: Left;  Left Popliteal and Tibial Embolectomy with patch angioplasty   ESOPHAGOGASTRODUODENOSCOPY (EGD) WITH PROPOFOL N/A 07/17/2021   Procedure: ESOPHAGOGASTRODUODENOSCOPY (EGD) WITH PROPOFOL;  Surgeon: Eloise Harman, DO;  [facility];  Service: Endoscopy;  Laterality: N/A;  1:00pm   INCISION AND DRAINAGE ABSCESS N/A 04/06/2016   Procedure: INCISION AND DRAINAGE ABSCESS;  Surgeon: Jonnie Kind, MD;  Location: [facility];  Service: Gynecology;  Laterality: N/A;   LEFT HEART CATHETERIZATION WITH CORONARY ANGIOGRAM N/A 10/01/2012   Procedure: LEFT HEART CATHETERIZATION WITH CORONARY ANGIOGRAM;  Surgeon: Peter M Martinique, MD;  Location: [facility] CATH LAB;  Service: Cardiovascular;  Laterality: N/A;   LOWER EXTREMITY ANGIOGRAM N/A 01/27/2011   Procedure: LOWER EXTREMITY ANGIOGRAM;  Surgeon: Rosetta Posner, MD;  Location: [facility] CATH LAB;  Service: Cardiovascular;  Laterality: N/A;   LOWER EXTREMITY ANGIOGRAM N/A 01/28/2011   Procedure: LOWER EXTREMITY ANGIOGRAM;  Surgeon: Conrad Bethel, MD;  Location: [facility] CATH LAB;  Service: Cardiovascular;  Laterality: N/A;   LOWER EXTREMITY ANGIOGRAM Left 01/29/2011   Procedure: LOWER EXTREMITY ANGIOGRAM;  Surgeon: Mal Misty, MD;  Location: [facility] CATH LAB;  Service: Cardiovascular;  Laterality: Left;   MULTIPLE TOOTH EXTRACTIONS     POLYPECTOMY  12/14/2021   Procedure: POLYPECTOMY;  Surgeon: Eloise Harman, DO;  [facility];  Service: Endoscopy;;   TEE WITHOUT CARDIOVERSION  04/15/2011   Procedure: TRANSESOPHAGEAL ECHOCARDIOGRAM (TEE);  Surgeon: [name], MD;  [facility];  Service: Cardiovascular;  Laterality: N/A;   WRIST SURGERY     Left    Current Outpatient Medications  Medication Sig Dispense Refill   acetaminophen (TYLENOL) 500 MG tablet Take 500 mg by mouth every 6 (six) hours as needed for moderate pain.     apixaban (ELIQUIS) 2.5 MG TABS tablet TAKE ONE TABLET (2.'5MG'$  TOTAL) BY MOUTH TWO TIMES DAILY 180 tablet 1   atorvastatin (LIPITOR) 80 MG tablet TAKE ONE TABLET ('80MG'$  TOTAL) BY MOUTH DAILY 90 tablet 3   calcium carbonate (TUMS EX) 750 MG chewable tablet Chew 1 tablet by mouth daily as needed for heartburn.     carvedilol (COREG) 6.25 MG tablet TAKE ONE TABLET (6.'25MG'$  TOTAL) BY MOUTH TWO TIMES DAILY WITH A MEAL 180 tablet 3   cetirizine (ZYRTEC) 10 MG tablet Take 10 mg by mouth daily as needed for allergies.     glucose blood (ACCU-CHEK GUIDE) test strip Use as instructed  to monitor glucose 4 times daily, before meals and before bed. 400 each 12   hydrALAZINE (APRESOLINE) 25 MG tablet TAKE ONE-HALF TABLET (12.'5MG'$  TOTAL) BY MOUTH TWO TIMES DAILY 90 tablet 3   insulin aspart (NOVOLOG) 100 UNIT/ML FlexPen Inject 1-7 Units into the skin 3 (three) times daily with meals. 15 mL 3   Insulin Pen Needle (UNIFINE PENTIPS) 31G X 8 MM MISC USE TO INJECT INSULIN SUBCUTANEOUSLY . USE FOR BOTH SLIDING SCALE AND FOR DAILYINSULIN DOSES. 100 each 11   linaclotide (LINZESS) 145 MCG CAPS capsule Take 1 capsule (145 mcg total) by mouth daily before breakfast. 30 capsule 5   mirtazapine (REMERON) 7.5 MG tablet Take 1 tablet (7.5 mg total) by mouth at bedtime. 30 tablet 0   omeprazole (PRILOSEC) 20 MG capsule Take 1 capsule (20 mg total) by mouth daily. 30 capsule 3   ondansetron (ZOFRAN) 4 MG tablet TAKE ONE TABLET BY MOUTH ('4MG'$ ) EVERY 4 TO 6 HOURS AS NEEDED FOR NAUSEA OR VOMITING 90 tablet 1   oxyCODONE-acetaminophen (PERCOCET) 10-325 MG tablet Take 0.5 tablets every 4 (four) hours as needed by mouth for pain. (Patient taking differently: Take 1 tablet by mouth every  4 (four) hours as needed for pain.) 120 tablet 0   simethicone (GAS-X) 80 MG chewable tablet Chew 80 mg by mouth every 6 (six) hours as needed for flatulence.     TRESIBA FLEXTOUCH 100 UNIT/ML FlexTouch Pen Inject 10 Units into the skin at bedtime. 9 mL 3   Misc. Devices MISC Wheelchair cushion - small. 1 each 0   No current facility-administered medications for this visit.    Allergies as of 02/04/2022 - Review Complete 02/04/2022  Allergen Reaction Noted   Losartan Swelling 04/23/2011   Mirtazapine  09/30/2021   Nsaids  12/01/2021    Family History  Problem Relation Age of Onset   COPD Mother    Hypertension Mother    Diabetes Mother    Stroke Mother    Coronary artery disease Mother    Diabetes Father    Hypertension Father    Coronary artery disease Father    Kidney disease Father        on dialysis, died from heart attack while being dialzed   Pancreatitis Maternal Aunt    Cancer Maternal Aunt        unknown primary   Anesthesia problems Neg Hx    Colon cancer Neg Hx     Social History   Socioeconomic History   Marital status: Single    Spouse name: Not on file   Number of children: Not on file   Years of education: Not on file   Highest education level: Not on file  Occupational History   Not on file  Tobacco Use   Smoking status: Every Day    Packs/day: 0.50    Years: 21.00    Total pack years: 10.50    Types: Cigarettes    Start date: 07/06/1989   Smokeless tobacco: Never  Vaping Use   Vaping Use: Never used  Substance and Sexual Activity   Alcohol use: No    Alcohol/week: 0.0 standard drinks of alcohol    Comment: rarely   Drug use: Not Currently    Frequency: 1.0 times per week    Types: Marijuana   Sexual activity: Yes    Partners: Male    Comment: pt stated that her tubal was only 50% effective- "not cut and burned"  Other Topics Concern   Not  on file  Social History Narrative   Unemployed.  Lives in [place] with [place], [place], and  mother.  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   Social Determinants of Health   Financial Resource Strain: Not on file  Food Insecurity: Not on file  Transportation Needs: Not on file  Physical Activity: Not on file  Stress: Not on file  Social Connections: Not on file  Intimate Partner Violence: Not on file     Review of Systems   Gen: Denies any fever, chills, fatigue, weight loss, lack of appetite.  CV: Denies chest pain, heart palpitations, peripheral edema, syncope.  Resp: Denies shortness of breath at rest or with exertion. Denies wheezing or cough.  GI: Denies dysphagia or odynophagia. Denies jaundice, hematemesis, fecal incontinence. GU : Denies urinary burning, urinary frequency, urinary hesitancy MS: Denies joint pain, muscle weakness, cramps, or limitation of movement.  Derm: Denies rash, itching, dry skin Psych: Denies depression, anxiety, memory loss, and confusion Heme: Denies bruising, bleeding, and enlarged lymph nodes.   Physical Exam   BP (!) 153/80   Pulse 88   Temp (!) 97.3 F (36.3 C)   Ht '5\' 1"'$  (1.549 m)   Wt 97 lb 12.8 oz (44.4 kg)   LMP 12/28/2010   BMI 18.48 kg/m  General:   Alert and oriented. Tearful, anxious.  Head:  Normocephalic and atraumatic. Eyes:  Without icterus Abdomen:  +BS, soft, TTP RLQ Rectal:  Deferred  Msk:  Symmetrical without gross deformities. Normal posture. Extremities:  Without edema. Neurologic:  Alert and  oriented x4;  grossly normal neurologically. Skin:  Intact without significant lesions or rashes. Psych:  Alert and cooperative. Normal mood and affect.    ultrasound report dated August 2022: -Gallbladder normal -Common bile duct measures 0.36 cm -2 cm cyst right kidney  Assessment   [name] is a 52 y.o. female presenting today in follow-up with a history of chronic abdominal pain, N/V, constipation, idiopathic pancreatitis in 2018 with glucose greater than 500, MRCP 2018 with severe motion  degradation. Gallbladder still present, as she was unable to follow through with cholecystectomy recommendations due to stroke.    Now with worsening right-sided abdominal pain. Difficult historian and quite tearful today. Daughter present. Differentials broad. No recent labs.  Will check stat labs today, CT. If unrevealing, will consider HIDA scan vs just referring to Gen Surg for consideration of biliary etiology. However, her pain is more concentrated in RLQ on exam.   GERD: not ideally controlled. Increase omeprazole to BID for now.   Constipation: continue Linzess 145 mcg daily. May need to increase in the future.  N/V: likely underlying gastroparesis. Poor glycemic control. Continue Zofran, and increase PPI to BID.     PLAN    CBC, CMP, lipase Stat CT today, oral contrast only due to CKD Continue Linzess 145 mcg daily Increase omeprazole to BID Consider HIDA vs referral to surgery   Annitta Needs, PhD, ANP-BC [facility]astroenter[facility]

## 2022-02-04 NOTE — Telephone Encounter (Signed)
Phoned and advised the pt of the Rx being sent in for her to take twice a day.

## 2022-02-11 ENCOUNTER — Other Ambulatory Visit: Payer: Self-pay | Admitting: Gastroenterology

## 2022-02-11 LAB — CBC WITH DIFFERENTIAL/PLATELET
Basophils Absolute: 0 10*3/uL (ref 0.0–0.2)
Basos: 1 %
EOS (ABSOLUTE): 0.3 10*3/uL (ref 0.0–0.4)
Eos: 3 %
Hematocrit: 39.7 % (ref 34.0–46.6)
Hemoglobin: 13.1 g/dL (ref 11.1–15.9)
Lymphocytes Absolute: 2.2 10*3/uL (ref 0.7–3.1)
Lymphs: 26 %
MCH: 30.5 pg (ref 26.6–33.0)
MCHC: 33 g/dL (ref 31.5–35.7)
MCV: 93 fL (ref 79–97)
Monocytes Absolute: 0.8 10*3/uL (ref 0.1–0.9)
Monocytes: 9 %
Neutrophils Absolute: 5.3 10*3/uL (ref 1.4–7.0)
Neutrophils: 61 %
Platelets: 261 10*3/uL (ref 150–450)
RBC: 4.29 x10E6/uL (ref 3.77–5.28)
RDW: 13.8 % (ref 11.7–15.4)
WBC: 8.5 10*3/uL (ref 3.4–10.8)

## 2022-02-11 LAB — COMPREHENSIVE METABOLIC PANEL
ALT: 14 IU/L (ref 0–32)
AST: 17 IU/L (ref 0–40)
Albumin/Globulin Ratio: 1.7 (ref 1.2–2.2)
Albumin: 4.1 g/dL (ref 3.8–4.9)
Alkaline Phosphatase: 110 IU/L (ref 44–121)
BUN/Creatinine Ratio: 18 (ref 9–23)
BUN: 57 mg/dL — ABNORMAL HIGH (ref 6–24)
Bilirubin Total: 0.3 mg/dL (ref 0.0–1.2)
CO2: 24 mmol/L (ref 20–29)
Calcium: 9.2 mg/dL (ref 8.7–10.2)
Chloride: 103 mmol/L (ref 96–106)
Creatinine, Ser: 3.24 mg/dL — ABNORMAL HIGH (ref 0.57–1.00)
Globulin, Total: 2.4 g/dL (ref 1.5–4.5)
Glucose: 201 mg/dL — ABNORMAL HIGH (ref 70–99)
Potassium: 5.4 mmol/L — ABNORMAL HIGH (ref 3.5–5.2)
Sodium: 137 mmol/L (ref 134–144)
Total Protein: 6.5 g/dL (ref 6.0–8.5)
eGFR: 17 mL/min/{1.73_m2} — ABNORMAL LOW (ref >59)

## 2022-02-11 LAB — LIPASE: Lipase: 27 U/L (ref 14–72)

## 2022-02-11 MED ORDER — LINACLOTIDE 290 MCG PO CAPS: 290.0000 ug | ORAL_CAPSULE | Freq: Every day | ORAL | 3 refills | Status: DC

## 2022-02-11 NOTE — Progress Notes (Signed)
Completed.

## 2022-02-15 ENCOUNTER — Other Ambulatory Visit: Payer: Self-pay

## 2022-02-15 DIAGNOSIS — R112 Nausea with vomiting, unspecified: Secondary | ICD-10-CM

## 2022-02-15 DIAGNOSIS — B179 Acute viral hepatitis, unspecified: Secondary | ICD-10-CM

## 2022-02-19 LAB — BASIC METABOLIC PANEL
BUN/Creatinine Ratio: 19 (ref 9–23)
BUN: 50 mg/dL — ABNORMAL HIGH (ref 6–24)
CO2: 18 mmol/L — ABNORMAL LOW (ref 20–29)
Calcium: 8.8 mg/dL (ref 8.7–10.2)
Chloride: 101 mmol/L (ref 96–106)
Creatinine, Ser: 2.58 mg/dL — ABNORMAL HIGH (ref 0.57–1.00)
Glucose: 415 mg/dL — ABNORMAL HIGH (ref 70–99)
Potassium: 5.5 mmol/L — ABNORMAL HIGH (ref 3.5–5.2)
Sodium: 135 mmol/L (ref 134–144)
eGFR: 22 mL/min/{1.73_m2} — ABNORMAL LOW (ref >59)

## 2022-02-23 ENCOUNTER — Encounter: Payer: Self-pay | Admitting: Cardiology

## 2022-02-24 ENCOUNTER — Other Ambulatory Visit: Payer: Self-pay

## 2022-02-24 MED ORDER — HYDRALAZINE HCL 25 MG PO TABS: 25.0000 mg | ORAL_TABLET | Freq: Two times a day (BID) | ORAL | 3 refills | Status: DC

## 2022-03-04 ENCOUNTER — Other Ambulatory Visit: Payer: Self-pay

## 2022-03-10 ENCOUNTER — Other Ambulatory Visit (HOSPITAL_COMMUNITY): Payer: Self-pay

## 2022-03-10 ENCOUNTER — Encounter (HOSPITAL_COMMUNITY): Payer: Self-pay | Admitting: Nephrology

## 2022-03-10 ENCOUNTER — Telehealth: Payer: Self-pay | Admitting: Pharmacy Technician

## 2022-03-10 NOTE — Telephone Encounter (Signed)
Auth Submission: NO AUTH NEEDED @[facility]  Payer: MEDICAID ACCESS/CAP Medication & CPT/J Code(s) submitted: Rituxan (Rituximab) [id number] Route of submission (phone, fax, portal):  Phone # Fax # Auth type: Pharmacy Benefit Units/visits requested: X2 Reference number:  Approval from: 03/10/22 to 01/18/23   Spoke with Linda/RN @ [facility] [phone] Script will be sent to [facility]. Medication will need to be shipped to [facility].

## 2022-03-11 ENCOUNTER — Other Ambulatory Visit (HOSPITAL_COMMUNITY): Payer: Self-pay

## 2022-03-11 ENCOUNTER — Other Ambulatory Visit: Payer: Self-pay | Admitting: Pharmacy Technician

## 2022-03-11 DIAGNOSIS — N189 Chronic kidney disease, unspecified: Secondary | ICD-10-CM

## 2022-03-11 DIAGNOSIS — N032 Chronic nephritic syndrome with diffuse membranous glomerulonephritis: Secondary | ICD-10-CM

## 2022-03-11 MED ORDER — RITUXAN 500 MG/50ML IV SOLN
1000.0000 mg | INTRAVENOUS | 0 refills | Status: DC
  Filled 2022-03-11 (×2): qty 200, 28d supply, fill #0

## 2022-03-12 ENCOUNTER — Other Ambulatory Visit (HOSPITAL_COMMUNITY): Payer: Self-pay

## 2022-03-15 ENCOUNTER — Other Ambulatory Visit: Payer: Self-pay

## 2022-03-15 ENCOUNTER — Other Ambulatory Visit (HOSPITAL_COMMUNITY): Payer: Self-pay

## 2022-03-16 ENCOUNTER — Other Ambulatory Visit: Payer: Self-pay

## 2022-03-16 ENCOUNTER — Encounter: Payer: Self-pay | Admitting: Internal Medicine

## 2022-03-16 ENCOUNTER — Other Ambulatory Visit: Payer: Self-pay | Admitting: Internal Medicine

## 2022-03-16 DIAGNOSIS — E440 Moderate protein-calorie malnutrition: Secondary | ICD-10-CM

## 2022-03-16 DIAGNOSIS — F411 Generalized anxiety disorder: Secondary | ICD-10-CM

## 2022-03-16 MED ORDER — MIRTAZAPINE 7.5 MG PO TABS: 7.5000 mg | ORAL_TABLET | Freq: Every day | ORAL | 5 refills | Status: DC

## 2022-03-17 ENCOUNTER — Ambulatory Visit: Payer: Medicaid Other | Admitting: Gastroenterology

## 2022-03-29 ENCOUNTER — Encounter: Payer: Self-pay | Admitting: Nurse Practitioner

## 2022-03-29 ENCOUNTER — Ambulatory Visit (INDEPENDENT_AMBULATORY_CARE_PROVIDER_SITE_OTHER): Payer: Medicaid Other | Admitting: Nurse Practitioner

## 2022-03-29 VITALS — BP 108/66 | HR 83 | Ht 61.0 in | Wt 102.2 lb

## 2022-03-29 DIAGNOSIS — I10 Essential (primary) hypertension: Secondary | ICD-10-CM

## 2022-03-29 DIAGNOSIS — E559 Vitamin D deficiency, unspecified: Secondary | ICD-10-CM

## 2022-03-29 DIAGNOSIS — E1059 Type 1 diabetes mellitus with other circulatory complications: Secondary | ICD-10-CM

## 2022-03-29 DIAGNOSIS — E782 Mixed hyperlipidemia: Secondary | ICD-10-CM

## 2022-03-29 LAB — POCT GLYCOSYLATED HEMOGLOBIN (HGB A1C): Hemoglobin A1C: 10.3 % — AB (ref 4.0–5.6)

## 2022-03-29 MED ORDER — DEXCOM G7 RECEIVER DEVI: 1.0000 | Freq: Once | 0 refills | Status: AC

## 2022-03-29 MED ORDER — TRESIBA FLEXTOUCH 100 UNIT/ML ~~LOC~~ SOPN: 10.0000 [IU] | PEN_INJECTOR | Freq: Every day | SUBCUTANEOUS | 3 refills | Status: DC

## 2022-03-29 MED ORDER — UNIFINE PENTIPS 31G X 8 MM MISC: 11 refills | Status: DC

## 2022-03-29 MED ORDER — INSULIN ASPART 100 UNIT/ML FLEXPEN: 1.0000 [IU] | PEN_INJECTOR | Freq: Three times a day (TID) | SUBCUTANEOUS | 3 refills | Status: DC

## 2022-03-29 MED ORDER — DEXCOM G7 SENSOR MISC: 1.0000 | 3 refills | Status: DC

## 2022-03-29 NOTE — Progress Notes (Signed)
Endocrinology Follow Up Note       03/29/2022, 2:37 PM   Subjective:    Patient ID: [name], female    DOB: July 31, 1970.  [name] is being seen in follow up after being seen in consultation for management of currently uncontrolled symptomatic diabetes requested by  Lindell Spar, MD.   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 s/p DES to LAD in 2014   Depression    Depression    Phreesia 11/19/2019   Diabetes mellitus without complication ([name])    Phreesia 11/19/2019   Diabetic ketoacidosis with coma associated with type 1 diabetes mellitus ([name])    DKA, type 1 ([name]) 11/17/2016   Facial cellulitis    12/2010   Glomerulonephritis    Headache(784.0)    Hemiplegia, unspecified affecting right dominant side ([name])    History of stroke    Hyperlipidemia    Insulin dependent diabetes mellitus    History of diabetic ketoacidosis   Ischemic cardiomyopathy    a. EF 30-35% in 2018 b. EF at 45-50% by repeat imaging in 02/2019   Left carotid artery occlusion    Major depressive disorder, single episode, unspecified    N&V (nausea and vomiting) 06/03/2021   Pancreatitis, acute 11/07/2016   Peripheral arterial disease ([name])    Shingles    11/2010   ST elevation myocardial infarction (STEMI) of anterior wall [name])    Late presentation September 2014   Stroke [name])    2018   Upper abdominal pain 06/03/2021    Past Surgical History:   Procedure Laterality Date   AMPUTATION  03/03/2011   Procedure: AMPUTATION DIGIT;  Surgeon: Newt Minion, MD;  Location: [facility];  Service: Orthopedics;  Laterality: Left;  Left Foot Amputation 4th and 5th toes at MTP joint   AMPUTATION  04/22/2011   Procedure: AMPUTATION BELOW KNEE;  Surgeon: Newt Minion, MD;  Location: [facility];  Service: Orthopedics;  Laterality: Left;  Left Below Knee Amputation   BIOPSY  07/17/2021   Procedure: BIOPSY;  Surgeon: Eloise Harman, DO;  [facility];  Service: Endoscopy;;   CATARACT EXTRACTION W/PHACO Left 01/09/2020   Procedure: CATARACT EXTRACTION PHACO AND INTRAOCULAR LENS PLACEMENT (IOC);  Surgeon: Baruch Goldmann, MD;  Location: [facility];  Service: Ophthalmology;  Laterality: Left;  CDE: 9.73   COLONOSCOPY WITH PROPOFOL N/A 12/14/2021   Procedure: COLONOSCOPY WITH PROPOFOL;  Surgeon: Eloise Harman, DO;  [facility];  Service: Endoscopy;  Laterality: N/A;  9:45 am   DILATION AND CURETTAGE OF UTERUS     EMBOLECTOMY  01/29/2011   Procedure: EMBOLECTOMY;  Surgeon: Mal Misty, MD;  [facility]: [name];  Service: Vascular;  Laterality: Left;  Left Popliteal and Tibial Embolectomy with patch angioplasty   ESOPHAGOGASTRODUODENOSCOPY (EGD) WITH PROPOFOL N/A 07/17/2021   Procedure: ESOPHAGOGASTRODUODENOSCOPY (EGD) WITH PROPOFOL;  Surgeon: Eloise Harman, DO;  [facility];  Service: Endoscopy;  Laterality: N/A;  1:00pm   INCISION AND DRAINAGE ABSCESS N/A 04/06/2016   Procedure: INCISION AND DRAINAGE ABSCESS;  Surgeon: Jonnie Kind, MD;  Location: [facility];  Service: Gynecology;  Laterality: N/A;   LEFT HEART CATHETERIZATION WITH CORONARY ANGIOGRAM N/A 10/01/2012   Procedure: LEFT HEART CATHETERIZATION WITH CORONARY ANGIOGRAM;  Surgeon: Peter M Martinique, MD;  Location: [name];  Service: Cardiovascular;  Laterality: N/A;   LOWER EXTREMITY ANGIOGRAM N/A 01/27/2011   Procedure: LOWER EXTREMITY ANGIOGRAM;  Surgeon: Rosetta Posner, MD;   Location: [name];  Service: Cardiovascular;  Laterality: N/A;   LOWER EXTREMITY ANGIOGRAM N/A 01/28/2011   Procedure: LOWER EXTREMITY ANGIOGRAM;  Surgeon: Conrad , MD;  [name];  Service: Cardiovascular;  Laterality: N/A;   LOWER EXTREMITY ANGIOGRAM Left 01/29/2011   Procedure: LOWER EXTREMITY ANGIOGRAM;  Surgeon: Mal Misty, MD;  Location: [name];  Service: Cardiovascular;  Laterality: Left;   MULTIPLE TOOTH EXTRACTIONS     POLYPECTOMY  12/14/2021   Procedure: POLYPECTOMY;  Surgeon: Eloise Harman, DO;  [facility];  Service: Endoscopy;;   TEE WITHOUT CARDIOVERSION  04/15/2011   Procedure: TRANSESOPHAGEAL ECHOCARDIOGRAM (TEE);  Surgeon: [name], MD;  [facility];  Service: Cardiovascular;  Laterality: N/A;   WRIST SURGERY     Left    Social History   Socioeconomic History   Marital status: Single    Spouse name: Not on file   Number of children: Not on file   Years of education: Not on file   Highest education level: Not on file  Occupational History   Not on file  Tobacco Use   Smoking status: Every Day    Packs/day: 0.50    Years: 21.00    Total pack years: 10.50    Types: Cigarettes    Start date: 07/06/1989   Smokeless tobacco: Never  Vaping Use   Vaping Use: Never used  Substance and Sexual Activity   Alcohol use: No    Alcohol/week: 0.0 standard drinks of alcohol    Comment: rarely   Drug use: Not Currently    Frequency: 1.0 times per week    Types: Marijuana   Sexual activity: Yes    Partners: Male    Comment: pt stated that her tubal was only 50% effective- "not cut and burned"  Other Topics Concern   Not on file  Social History Narrative   Unemployed.  Lives in [place] with Palma Sola, [name], and mother.  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   Social Determinants of Health   Financial Resource Strain: Not on file  Food Insecurity: Not on file  Transportation Needs: Not on file   Physical Activity: Not on file  Stress: Not on file  Social Connections: Not on file    Family History  Problem Relation Age of Onset   COPD Mother    Hypertension Mother    Diabetes Mother    Stroke Mother    Coronary artery disease Mother    Diabetes Father    Hypertension Father    Coronary artery disease Father    Kidney disease Father  on dialysis, died from heart attack while being dialzed   Pancreatitis Maternal Aunt    Cancer Maternal Aunt        unknown primary   Anesthesia problems Neg Hx    Colon cancer Neg Hx     Outpatient Encounter Medications as of 03/29/2022  Medication Sig   acetaminophen (TYLENOL) 500 MG tablet Take 500 mg by mouth every 6 (six) hours as needed for moderate pain.   apixaban (ELIQUIS) 2.5 MG TABS tablet TAKE ONE TABLET (2.'5MG'$  TOTAL) BY MOUTH TWO TIMES DAILY   atorvastatin (LIPITOR) 80 MG tablet TAKE ONE TABLET ('80MG'$  TOTAL) BY MOUTH DAILY   calcium carbonate (TUMS EX) 750 MG chewable tablet Chew 1 tablet by mouth daily as needed for heartburn.   carvedilol (COREG) 6.25 MG tablet TAKE ONE TABLET (6.'25MG'$  TOTAL) BY MOUTH TWO TIMES DAILY WITH A MEAL   cetirizine (ZYRTEC) 10 MG tablet Take 10 mg by mouth daily as needed for allergies.   Continuous Blood Gluc Receiver (DEXCOM G7 RECEIVER) DEVI 1 Device by Does not apply route once for 1 dose.   Continuous Blood Gluc Sensor (DEXCOM G7 SENSOR) MISC Inject 1 Application into the skin as directed. Change sensor every 10 days as directed.   glucose blood (ACCU-CHEK GUIDE) test strip Use as instructed to monitor glucose 4 times daily, before meals and before bed.   hydrALAZINE (APRESOLINE) 25 MG tablet Take 1 tablet (25 mg total) by mouth in the morning and at bedtime.   linaclotide (LINZESS) 290 MCG CAPS capsule Take 1 capsule (290 mcg total) by mouth daily before breakfast.   mirtazapine (REMERON) 7.5 MG tablet Take 1 tablet (7.5 mg total) by mouth at bedtime.   Misc. Devices MISC Wheelchair cushion  - small.   omeprazole (PRILOSEC) 20 MG capsule Take 1 capsule (20 mg total) by mouth 2 (two) times daily before a meal.   ondansetron (ZOFRAN) 4 MG tablet TAKE ONE TABLET BY MOUTH ('4MG'$ ) EVERY 4 TO 6 HOURS AS NEEDED FOR NAUSEA OR VOMITING   oxyCODONE-acetaminophen (PERCOCET) 10-325 MG tablet Take 0.5 tablets every 4 (four) hours as needed by mouth for pain. (Patient taking differently: Take 1 tablet by mouth every 4 (four) hours as needed for pain.)   RITUXAN 500 MG/50ML injection Inject 100 mLs (1,000 mg total) into the vein every 14 (fourteen) days. Courier to [facility]. [address] [place] [address] Attn: Mikey Kirschner. (pharmacy dept)   simethicone (GAS-X) 80 MG chewable tablet Chew 80 mg by mouth every 6 (six) hours as needed for flatulence.   [DISCONTINUED] insulin aspart (NOVOLOG) 100 UNIT/ML FlexPen Inject 1-7 Units into the skin 3 (three) times daily with meals.   [DISCONTINUED] Insulin Pen Needle (UNIFINE PENTIPS) 31G X 8 MM MISC USE TO INJECT INSULIN SUBCUTANEOUSLY . USE FOR BOTH SLIDING SCALE AND FOR DAILYINSULIN DOSES.   [DISCONTINUED] TRESIBA FLEXTOUCH 100 UNIT/ML FlexTouch Pen Inject 10 Units into the skin at bedtime.   insulin aspart (NOVOLOG) 100 UNIT/ML FlexPen Inject 1-7 Units into the skin 3 (three) times daily with meals.   Insulin Pen Needle (UNIFINE PENTIPS) 31G X 8 MM MISC USE TO INJECT INSULIN SUBCUTANEOUSLY . USE FOR BOTH SLIDING SCALE AND FOR DAILYINSULIN DOSES.   TRESIBA FLEXTOUCH 100 UNIT/ML FlexTouch Pen Inject 10 Units into the skin at bedtime.   No facility-administered encounter medications on file as of 03/29/2022.    ALLERGIES: Allergies  Allergen Reactions   Losartan Swelling   Nsaids     Kidney disease  VACCINATION STATUS: Immunization History  Administered Date(s) Administered   Influenza, Seasonal, Injecte, Preservative Fre 11/23/2018   Influenza,inj,Quad PF,6+ Mos 10/24/2014, 11/28/2019, 11/13/2020, 12/31/2021   Influenza-Unspecified  11/08/2011, 01/15/2013, 11/20/2015   Pneumococcal Polysaccharide-23 11/08/2016    Diabetes She presents for her follow-up diabetic visit. She has type 1 diabetes mellitus. Onset time: She was diagnosed at approximate age of 13. Her disease course has been fluctuating. There are no hypoglycemic associated symptoms. Associated symptoms include blurred vision, fatigue, foot paresthesias, polydipsia, polyphagia and polyuria. Pertinent negatives for diabetes include no weight loss. There are no hypoglycemic complications. (Has has several hypoglycemic events in the past where EMS was called.  She is extremely scared of becoming hypoglycemic again.) Symptoms are stable. Diabetic complications include a CVA, heart disease, nephropathy, peripheral neuropathy and PVD. Risk factors for coronary artery disease include diabetes mellitus, dyslipidemia, hypertension, sedentary lifestyle and tobacco exposure. Current diabetic treatment includes intensive insulin program. She is compliant with treatment most of the time. Her weight is fluctuating minimally. She is following a generally unhealthy diet. When asked about meal planning, she reported none. She has not had a previous visit with a dietitian. She never (Has L AKA with prosthesis) participates in exercise. Her home blood glucose trend is decreasing steadily. Her overall blood glucose range is >200 mg/dl. (She presents today, accompanied by her daughter, with her glucose readings showing fluctuating glycemic profile but improved overall.  Her POCT A1c today is 10.3, slightly improving from last visit of 10.5%.  Analysis of her meter shows 7-day average of 214, 14-day average of 233, 30-day average of 244, 90-day average of 245.  She still eats only 1-2 real meals per day but has started incorporating more protein shakes.  She brings by her podiatry request for DM footwear.) An ACE inhibitor/angiotensin II receptor blocker is not being taken. She sees a podiatrist.Eye  exam is current.  Hyperlipidemia This is a chronic problem. The current episode started more than 1 year ago. The problem is uncontrolled. Recent lipid tests were reviewed and are variable. Exacerbating diseases include chronic renal disease and diabetes. Factors aggravating her hyperlipidemia include beta blockers, smoking and fatty foods. Current antihyperlipidemic treatment includes statins. Compliance problems include adherence to diet and adherence to exercise.  Risk factors for coronary artery disease include diabetes mellitus, dyslipidemia, hypertension and a sedentary lifestyle.  Hypertension This is a chronic problem. The current episode started more than 1 year ago. The problem has been waxing and waning since onset. The problem is uncontrolled. Associated symptoms include blurred vision. There are no associated agents to hypertension. Risk factors for coronary artery disease include diabetes mellitus, dyslipidemia, smoking/tobacco exposure and sedentary lifestyle. Past treatments include beta blockers. The current treatment provides mild improvement. Compliance problems include diet and exercise.  Hypertensive end-organ damage includes kidney disease, CAD/MI, CVA, heart failure and PVD. Identifiable causes of hypertension include chronic renal disease.    Review of systems  Constitutional: +minimally fluctuating body weight,  current Body mass index is 19.31 kg/m. 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), no xerophthalmia ENT: no sore throat, no nodules palpated in throat, no dysphagia/odynophagia, no hoarseness Cardiovascular: no chest pain, no shortness of breath, no palpitations, no leg swelling Respiratory: no cough, no shortness of breath Gastrointestinal: no nausea/vomiting/diarrhea Genitourinary: +polyuria, + polyphagia Musculoskeletal: no muscle/joint aches, Hx of L AKA- with  prosthesis Skin: no rashes, no hyperemia Neurological: no tremors, no numbness,  no tingling, no dizziness Psychiatric: no depression, no anxiety   Objective:    BP 108/66 (BP Location: Left Arm, Patient Position: Sitting, Cuff Size: Normal)   Pulse 83   Ht '5\' 1"'$  (1.549 m)   Wt 102 lb 3.2 oz (46.4 kg)   LMP 12/28/2010   BMI 19.31 kg/m   Wt Readings from Last 3 Encounters:  03/29/22 102 lb 3.2 oz (46.4 kg)  02/04/22 97 lb 12.8 oz (44.4 kg)  12/31/21 103 lb (46.7 kg)    BP Readings from Last 3 Encounters:  03/29/22 108/66  02/04/22 (!) 154/81  12/31/21 (!) 146/72    Physical Exam- Limited  Constitutional:  Body mass index is 19.31 kg/m. , not in acute distress, mildly anxious state of mind, tearful during visit, irritable Eyes:  EOMI, no exophthalmos Musculoskeletal: L AKA with prosthesis, strength intact in all four extremities, no gross restriction of joint movements Skin:  no rashes, no hyperemia, + nicotinic discoloration to fingernails Neurological: no tremor with outstretched hands    CMP ( most recent) CMP     Component Value Date/Time   NA 135 02/18/2022 1451   K 5.5 (H) 02/18/2022 1451   CL 101 02/18/2022 1451   CO2 18 (L) 02/18/2022 1451   GLUCOSE 415 (H) 02/18/2022 1451   GLUCOSE 172 (H) 12/08/2021 1015   BUN 50 (H) 02/18/2022 1451   CREATININE 2.58 (H) 02/18/2022 1451   CALCIUM 8.8 02/18/2022 1451   PROT 6.5 02/11/2022 1230   ALBUMIN 4.1 02/11/2022 1230   AST 17 02/11/2022 1230   ALT 14 02/11/2022 1230   ALKPHOS 110 02/11/2022 1230   BILITOT 0.3 02/11/2022 1230   GFRNONAA 21 (L) 12/08/2021 1015   GFRAA 28 (L) 09/28/2019 1456     Diabetic Labs (most recent): Lab Results  Component Value Date   HGBA1C 10.3 (A) 03/29/2022   HGBA1C 10.5 (A) 12/23/2021   HGBA1C 11.6 (A) 04/13/2021     Lipid Panel ( most recent) Lipid Panel     Component Value Date/Time   CHOL 151 04/09/2021 1227   TRIG 75 04/09/2021 1227   HDL 59 04/09/2021 1227    CHOLHDL 2.6 04/09/2021 1227   CHOLHDL 4.2 11/07/2016 0722   VLDL 30 11/07/2016 0722   LDLCALC 77 04/09/2021 1227   LABVLDL 15 04/09/2021 1227       Assessment & Plan:   1) Type 1 diabetes mellitus with other circulatory complication ([place])  She presents today, accompanied by her daughter, with her glucose readings showing fluctuating glycemic profile but improved overall.  Her POCT A1c today is 10.3, slightly improving from last visit of 10.5%.  Analysis of her meter shows 7-day average of 214, 14-day average of 233, 30-day average of 244, 90-day average of 245.  She still eats only 1-2 real meals per day but has started incorporating more protein shakes.  She brings by her podiatry request for DM footwear.  - MACKENSI IMIG has currently uncontrolled symptomatic type 2 DM since 52 years of age.  Recent labs reviewed.  - I had a long discussion with her about the progressive nature of diabetes and the pathology behind its complications. -her diabetes is complicated by CAD, PVD, CKD, CVA, MI, DKA and she remains at a high risk for more acute and chronic complications which include retinopathy, and neuropathy. These are all discussed in detail with her.  - Nutritional counseling repeated at each appointment due to patients tendency to fall back in to old habits.  -  The patient admits there is a room for improvement in their diet and drink choices. -  Suggestion is made for the patient to avoid simple carbohydrates from their diet including Cakes, Sweet Desserts / Pastries, Ice Cream, Soda (diet and regular), Sweet Tea, Candies, Chips, Cookies, Sweet Pastries, Store Bought Juices, Alcohol in Excess of 1-2 drinks a day, Artificial Sweeteners, Coffee Creamer, and "Sugar-free" Products. This will help patient to have stable blood glucose profile and potentially avoid unintended weight gain.   - I encouraged the patient to switch to unprocessed or minimally processed complex starch and increased  protein intake (animal or plant source), fruits, and vegetables.   - Patient is advised to stick to a routine mealtimes to eat 3 meals a day and avoid unnecessary snacks (to snack only to correct hypoglycemia).  - she will be scheduled with Jearld Fenton, RDN, CDE for diabetes education.  - I have approached her with the following individualized plan to manage  her diabetes and patient agrees:    -She is advised to continue her current dose Tresiba 10 units SQ nightly.  She can continue her Novolog 1-7 units TID with meals if glucose is above 90 and she is eating (Specific instructions on how to titrate insulin dosage based on glucose readings given to patient in writing).  She is limited by her lack of meal routine.     -She is encouraged to continue monitoring blood glucose 4 times per day using her CGM (which she was recently approved for), before meals and before bed, and notify the clinic if she has readings less than 70 or greater than 300 for 3 tests in a row.  She could benefit from CGM device for safety purposes to help avoid debilitating low glucose events but insurance will not cover it given she did not use it properly nor see improvement in her glucose when she used it previously.  She has been monitoring glucose with the help of her daughter 4 times daily, enough to try and get it covered.  - she is warned not to take insulin without proper monitoring per orders.  - Adjustment parameters are given to her for hypo and hyperglycemia in writing.  - Specific targets for  A1c;  LDL, HDL,  and Triglycerides were discussed with the patient.  2) Blood Pressure /Hypertension:  Her blood pressure is not controlled to target.  She is advised to continue Carvedilol 6.25 mg po twice daily and Hydralazine 12.5 mg po twice daily.  Will defer any med changes to nephrology.  3) Lipids/Hyperlipidemia:  Her most recent lipid panel from 04/09/21 shows controlled LDL of 77.5.  She is advised to  continue Lipitor 80 mg po daily at bedtime.  Side effects and precautions discussed with her.  She is also advised to avoid fried foods and butter.  4)  Weight/Diet: Her Body mass index is 19.31 kg/m.  -  she is not a candidate for weight loss.  Exercise, and detailed carbohydrates information provided  -  detailed on discharge instructions.  5) Vitamin D deficiency Her most recent vitamin D level was 16.2 on 04/09/21.  She is not currently on supplementation.    6) Chronic Care/Health Maintenance: -she is on Statin medications and is encouraged to initiate and continue to follow up with Ophthalmology, Dentist,  Podiatrist at least yearly or according to recommendations, and advised to [place]. I have recommended yearly flu vaccine and pneumonia vaccine at least every 5 years; moderate intensity exercise for  up to 150 minutes weekly; and  sleep for at least 7 hours a day.  Smoking cessation instruction/counseling given:  counseled patient on the dangers of tobacco use, advised patient to stop smoking, and reviewed strategies to maximize success   - she is advised to maintain close follow up with Lindell Spar, MD for primary care needs, as well as her other providers for optimal and coordinated care. -filled out forms for her to be able to get diabetic shoes.     I spent  37  minutes in the care of the patient today including review of labs from [facility], Lipids, Thyroid Function, Hematology (current and previous including abstractions from other facilities); face-to-face time discussing  her blood glucose readings/logs, discussing hypoglycemia and hyperglycemia episodes and symptoms, medications doses, her options of short and long term treatment based on the latest standards of care / guidelines;  discussion about incorporating lifestyle medicine;  and documenting the encounter. Risk reduction counseling performed per USPSTF guidelines to reduce obesity and cardiovascular risk factors.      Please refer to Patient Instructions for Blood Glucose Monitoring and Insulin/Medications Dosing Guide"  in media tab for additional information. Please  also refer to " Patient Self Inventory" in the Media  tab for reviewed elements of pertinent patient history.  Silvestre Gunner participated in the discussions, expressed understanding, and voiced agreement with the above plans.  All questions were answered to her satisfaction. she is encouraged to contact clinic should she have any questions or concerns prior to her return visit.   Follow up plan: - Return in about 3 months (around 06/29/2022) for Diabetes F/U with A1c in office, No previsit labs, Bring meter and logs.   Rayetta Pigg, [name] [address] [place],  [address] Phone: [phone] Fax: [phone]  03/29/2022, 2:37 PM

## 2022-04-01 ENCOUNTER — Other Ambulatory Visit (HOSPITAL_COMMUNITY): Payer: Self-pay

## 2022-04-02 ENCOUNTER — Encounter (HOSPITAL_COMMUNITY): Payer: Self-pay | Admitting: Nephrology

## 2022-04-12 ENCOUNTER — Other Ambulatory Visit (HOSPITAL_COMMUNITY): Payer: Self-pay

## 2022-04-14 NOTE — Progress Notes (Signed)
    Cardiology Office Note  Date: 04/19/2022   ID: [name], DOB [birth year], MRN [id number]  History of Present Illness: [name] is a 52 y.o. female last seen in October 2023.  She is here with her daughter for a follow-up visit.  From a cardiac perspective, no major change in symptomatology, specifically no angina, no weight gain or obvious fluid retention.  She is still following regularly with Dr. Theador Hawthorne, CKD stage IV and with evidence of biopsy-proven membranous glomerulopathy.  Most recent creatinine was 2.58.  I reviewed her cardiac regimen which is otherwise stable.  She reports no spontaneous bleeding problems on Eliquis.  Physical Exam: VS:  BP 134/80   Pulse 90   Ht 5\' 1"  (1.549 m)   Wt 101 lb (45.8 kg)   LMP 12/28/2010   SpO2 99%   BMI 19.08 kg/m , BMI Body mass index is 19.08 kg/m.  Wt Readings from Last 3 Encounters:  04/19/22 101 lb (45.8 kg)  03/29/22 102 lb 3.2 oz (46.4 kg)  02/04/22 97 lb 12.8 oz (44.4 kg)    General: Patient appears comfortable at rest. HEENT: Conjunctiva and lids normal. Neck: Supple, no elevated JVP, left carotid bruit. Lungs: Diminished breath sounds without wheezing. Cardiac: Regular rate and rhythm, no S3, 1/6 systolic murmur. Extremities: Status post left BKA with prosthesis in position.  ECG:  An ECG dated 11/05/2021 was personally reviewed today and demonstrated:  Sinus rhythm with right atrial enlargement, left anterior fascicular block, pulmonary disease pattern, poor R wave progression, anterolateral T wave inversions.  Labwork: 02/11/2022: ALT 14; AST 17; Hemoglobin 13.1; Platelets 261 02/18/2022: BUN 50; Creatinine, Ser 2.58; Potassium 5.5; Sodium 135     Component Value Date/Time   CHOL 151 04/09/2021 1227   TRIG 75 04/09/2021 1227   HDL 59 04/09/2021 1227   CHOLHDL 2.6 04/09/2021 1227   CHOLHDL 4.2 11/07/2016 0722   VLDL 30 11/07/2016 0722   LDLCALC 77 04/09/2021 1227   Other Studies Reviewed Today:  No  interval cardiac testing for review today.  Assessment and Plan:  1.  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  GDMT limited by CKD stage IV and ARB allergy.  No obvious fluid retention noted today.  Continue Coreg and hydralazine.  2.  CAD status post DES to the LAD in 2014.  No angina at current level of activity.  She remains on high-dose Lipitor.  3.  CKD stage IV, last creatinine 2.58 with potassium 5.5.  She has biopsy-proven membranous glomerulopathy and follows with Dr. Theador Hawthorne.  4.  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  She is on chronic anticoagulation with Eliquis.  No spontaneous bleeding problems reported.  Recent hemoglobin 13.1.  Disposition:  Follow up  6 months.  Signed, Satira Sark, M.D., F.A.C.C. [place] at [facility]

## 2022-04-19 ENCOUNTER — Encounter (HOSPITAL_COMMUNITY): Payer: Self-pay | Admitting: Internal Medicine

## 2022-04-19 ENCOUNTER — Encounter: Payer: Self-pay | Admitting: Cardiology

## 2022-04-19 ENCOUNTER — Other Ambulatory Visit: Payer: Self-pay | Admitting: Internal Medicine

## 2022-04-19 ENCOUNTER — Encounter: Payer: Self-pay | Admitting: Internal Medicine

## 2022-04-19 ENCOUNTER — Ambulatory Visit: Payer: Medicaid Other | Attending: Cardiology | Admitting: Cardiology

## 2022-04-19 VITALS — BP 134/80 | HR 90 | Ht 61.0 in | Wt 101.0 lb

## 2022-04-19 DIAGNOSIS — N6311 Unspecified lump in the right breast, upper outer quadrant: Secondary | ICD-10-CM

## 2022-04-19 DIAGNOSIS — I5020 Unspecified systolic (congestive) heart failure: Secondary | ICD-10-CM | POA: Diagnosis not present

## 2022-04-19 DIAGNOSIS — N184 Chronic kidney disease, stage 4 (severe): Secondary | ICD-10-CM | POA: Insufficient documentation

## 2022-04-19 DIAGNOSIS — I25119 Atherosclerotic heart disease of native coronary artery with unspecified angina pectoris: Secondary | ICD-10-CM | POA: Diagnosis not present

## 2022-04-19 DIAGNOSIS — Z1231 Encounter for screening mammogram for malignant neoplasm of breast: Secondary | ICD-10-CM

## 2022-04-19 NOTE — Patient Instructions (Signed)
Medication Instructions:  Your physician recommends that you continue on your current medications as directed. Please refer to the Current Medication list given to you today.   Labwork: None today  Testing/Procedures: None today  Follow-Up: 6 months  Any Other Special Instructions Will Be Listed Below (If Applicable).  If you need a refill on your cardiac medications before your next appointment, please call your pharmacy.  

## 2022-04-29 ENCOUNTER — Ambulatory Visit (HOSPITAL_COMMUNITY)
Admission: RE | Admit: 2022-04-29 | Discharge: 2022-04-29 | Disposition: A | Discharge status: Home / Self Care | Payer: Medicaid Other | Source: Ambulatory Visit | Attending: Internal Medicine | Admitting: Internal Medicine

## 2022-04-29 DIAGNOSIS — N6311 Unspecified lump in the right breast, upper outer quadrant: Secondary | ICD-10-CM | POA: Insufficient documentation

## 2022-05-05 ENCOUNTER — Encounter: Payer: Self-pay | Admitting: Internal Medicine

## 2022-05-05 ENCOUNTER — Ambulatory Visit (INDEPENDENT_AMBULATORY_CARE_PROVIDER_SITE_OTHER): Payer: Medicaid Other | Admitting: Internal Medicine

## 2022-05-05 VITALS — BP 108/59 | HR 73 | Ht 61.0 in | Wt 101.0 lb

## 2022-05-05 DIAGNOSIS — Z89512 Acquired absence of left leg below knee: Secondary | ICD-10-CM

## 2022-05-05 DIAGNOSIS — N184 Chronic kidney disease, stage 4 (severe): Secondary | ICD-10-CM

## 2022-05-05 DIAGNOSIS — I10 Essential (primary) hypertension: Secondary | ICD-10-CM

## 2022-05-05 DIAGNOSIS — G8191 Hemiplegia, unspecified affecting right dominant side: Secondary | ICD-10-CM | POA: Diagnosis not present

## 2022-05-05 DIAGNOSIS — I739 Peripheral vascular disease, unspecified: Secondary | ICD-10-CM

## 2022-05-05 DIAGNOSIS — I2510 Atherosclerotic heart disease of native coronary artery without angina pectoris: Secondary | ICD-10-CM

## 2022-05-05 DIAGNOSIS — E1059 Type 1 diabetes mellitus with other circulatory complications: Secondary | ICD-10-CM

## 2022-05-05 DIAGNOSIS — F411 Generalized anxiety disorder: Secondary | ICD-10-CM

## 2022-05-05 DIAGNOSIS — E440 Moderate protein-calorie malnutrition: Secondary | ICD-10-CM

## 2022-05-05 DIAGNOSIS — N052 Unspecified nephritic syndrome with diffuse membranous glomerulonephritis: Secondary | ICD-10-CM

## 2022-05-05 MED ORDER — MIRTAZAPINE 7.5 MG PO TABS: 7.5000 mg | ORAL_TABLET | Freq: Every day | ORAL | 5 refills | Status: DC

## 2022-05-05 NOTE — Progress Notes (Signed)
Established Patient Office Visit  Subjective:  Patient ID: [name], female    DOB: 08/09/1970  Age: 52 y.o. MRN: [id number]  CC:  Chief Complaint  Patient presents with   Diabetes    Four month follow up. Patient is wanting a [facility]. Patient has a spot on her finger that she would like to have looked at.    HPI [name] is a 52 y.o. 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  Her daughter was present during the visit.  Type I DM: Her last HbA1C was 10.3, which is slightly better than prior.  She has been taking [place] 10 units at bedtime and follows insulin sliding scale.  Her daughter checks her blood sugars before meals and at bedtime, which have been variable.  She has been following up with endocrinology for it.  Of note, she skips meals most of the days, and cannot take mealtime insulin due to it.  She also has progressive CKD, last CMP reviewed.  She denies any dysuria, hematuria or urinary hesitancy.  She follows up with Dr. Wolfgang Phoenix.  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  She was prescribed Rituxan, but she has opted to avoid it for now due to its side effects.  She also complains of severe spells of anxiety and insomnia.  She also reports anhedonia, fatigue and lack of concentration.  Denies SI or HI.  She also reports loss of weight in the past, but her daughter reports that she has very poor p.o. intake. She was given Remeron 15 U qHS, but had episodes of confusion with it.  She has been tolerating Remeron 7.5 mg dose.  Her weight has been stable compared to the last visit.  Her insomnia has also improved.  She complains of chronic  low back pain.  She has difficulty ambulating, and is mostly wheelchair-bound or bedridden.  Her daughter helps her with ADLs.  Does not have any sacral or pressure ulcer currently.  She is on oxycodone for chronic pain.  She also has radiating symptoms to RLE, but did not like gabapentin or amitriptyline for neuropathy.  She also requests referral to prosthetic clinic.    Past Medical History:  Diagnosis Date   Acquired absence of left leg below knee    Anxiety    was on xanax for years but discontinued prior to prior PCP retiring as she was on opioids and was told she would not find someone to write for both   Arterial thromboembolism    Prior embolectomy, previously on Coumadin   Cataract    Phreesia 11/19/2019   Chronic kidney disease    Phreesia 11/19/2019   Clotting disorder    Phreesia 11/19/2019   Coronary atherosclerosis of native coronary artery    a. s/p DES to LAD in 2014   Depression    Depression    Phreesia 11/19/2019   Diabetes mellitus without complication    Phreesia 11/19/2019   Diabetic ketoacidosis with coma associated with type 1 diabetes mellitus    DKA, type 1 11/17/2016   Facial cellulitis    12/2010   Glomerulonephritis    Headache(784.0)    Hemiplegia, unspecified affecting right dominant  side    History of stroke    Hyperlipidemia    Insulin dependent diabetes mellitus    History of diabetic ketoacidosis   Ischemic cardiomyopathy    a. EF 30-35% in 2018 b. EF at 45-50% by repeat imaging in 02/2019   Left carotid artery occlusion    Major depressive disorder, single episode, unspecified    N&V (nausea and vomiting) 06/03/2021   Pancreatitis, acute 11/07/2016   Peripheral arterial disease    Shingles    11/2010   ST elevation myocardial infarction (STEMI) of anterior wall    Late presentation September 2014   Stroke    2018   Upper abdominal pain 06/03/2021    Past Surgical History:  Procedure Laterality Date   AMPUTATION  03/03/2011    Procedure: AMPUTATION DIGIT;  Surgeon: Nadara Mustard, MD;  Location: [facility] OR;  Service: Orthopedics;  Laterality: Left;  Left Foot Amputation 4th and 5th toes at MTP joint   AMPUTATION  04/22/2011   Procedure: AMPUTATION BELOW KNEE;  Surgeon: Nadara Mustard, MD;  Location: [facility] OR;  Service: Orthopedics;  Laterality: Left;  Left Below Knee Amputation   BIOPSY  07/17/2021   Procedure: BIOPSY;  Surgeon: Lanelle Bal, DO;  [facility] SU[facility];  Service: Endoscopy;;   CATARACT EXTRACTION W/PHACO Left 01/09/2020   Procedure: CATARACT EXTRACTION PHACO AND INTRAOCULAR LENS PLACEMENT (IOC);  Surgeon: Fabio Pierce, MD;  Location: [facility] ORS;  Service: Ophthalmology;  Laterality: Left;  CDE: 9.73   COLONOSCOPY WITH PROPOFOL N/A 12/14/2021   Procedure: COLONOSCOPY WITH PROPOFOL;  Surgeon: Lanelle Bal, DO;  Location: [facility];  Service: Endoscopy;  Laterality: N/A;  9:45 am   DILATION AND CURETTAGE OF UTERUS     EMBOLECTOMY  01/29/2011   Procedure: EMBOLECTOMY;  Surgeon: Pryor Ochoa, MD;  Location: [facility] OR;  Service: Vascular;  Laterality: Left;  Left Popliteal and Tibial Embolectomy with patch angioplasty   ESOPHAGOGASTRODUODENOSCOPY (EGD) WITH PROPOFOL N/A 07/17/2021   Procedure: ESOPHAGOGASTRODUODENOSCOPY (EGD) WITH PROPOFOL;  Surgeon: Lanelle Bal, DO;  [facility];  Service: Endoscopy;  Laterality: N/A;  1:00pm   INCISION AND DRAINAGE ABSCESS N/A 04/06/2016   Procedure: INCISION AND DRAINAGE ABSCESS;  Surgeon: Tilda Burrow, MD;  Location: [facility] ORS;  Service: Gynecology;  Laterality: N/A;   LEFT HEART CATHETERIZATION WITH CORONARY ANGIOGRAM N/A 10/01/2012   Procedure: LEFT HEART CATHETERIZATION WITH CORONARY ANGIOGRAM;  Surgeon: Peter M Swaziland, MD;  Location: [facility] CATH LAB;  Service: Cardiovascular;  Laterality: N/A;   LOWER EXTREMITY ANGIOGRAM N/A 01/27/2011   Procedure: LOWER EXTREMITY ANGIOGRAM;  Surgeon: Larina Earthly, MD;  Location: [facility] CATH LAB;  Service: Cardiovascular;   Laterality: N/A;   LOWER EXTREMITY ANGIOGRAM N/A 01/28/2011   Procedure: LOWER EXTREMITY ANGIOGRAM;  Surgeon: Fransisco Hertz, MD;  Location: [facility] CATH LAB;  Service: Cardiovascular;  Laterality: N/A;   LOWER EXTREMITY ANGIOGRAM Left 01/29/2011   Procedure: LOWER EXTREMITY ANGIOGRAM;  Surgeon: Pryor Ochoa, MD;  Location: [facility] CATH LAB;  Service: Cardiovascular;  Laterality: Left;   MULTIPLE TOOTH EXTRACTIONS     POLYPECTOMY  12/14/2021   Procedure: POLYPECTOMY;  Surgeon: Lanelle Bal, DO;  [facility];  Service: Endoscopy;;   TEE WITHOUT CARDIOVERSION  04/15/2011   Procedure: TRANSESOPHAGEAL ECHOCARDIOGRAM (TEE);  Surgeon: Kathlen Brunswick, MD;  [facility];  Service: Cardiovascular;  Laterality: N/A;   WRIST SURGERY     Left    Family History  Problem  Relation Age of Onset   COPD Mother    Hypertension Mother    Diabetes Mother    Stroke Mother    Coronary artery disease Mother    Diabetes Father    Hypertension Father    Coronary artery disease Father    Kidney disease Father        on dialysis, died from heart attack while being dialzed   Pancreatitis Maternal Aunt    Cancer Maternal Aunt        unknown primary   Anesthesia problems Neg Hx    Colon cancer Neg Hx     Social History   Socioeconomic History   Marital status: Single    Spouse name: Not on file   Number of children: Not on file   Years of education: Not on file   Highest education level: Not on file  Occupational History   Not on file  Tobacco Use   Smoking status: Every Day    Packs/day: 0.50    Years: 21.00    Additional pack years: 0.00    Total pack years: 10.50    Types: Cigarettes    Start date: 07/06/1989   Smokeless tobacco: Never  Vaping Use   Vaping Use: Never used  Substance and Sexual Activity   Alcohol use: No    Alcohol/week: 0.0 standard drinks of alcohol    Comment: rarely   Drug use: Not Currently    Frequency: 1.0 times per week    Types: Marijuana    Sexual activity: Yes    Partners: Male    Comment: pt stated that her tubal was only 50% effective- "not cut and burned"  Other Topics Concern   Not on file  Social History Narrative   Unemployed.  Lives in [place] with [place], [place], and mother.  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   Social Determinants of Health   Financial Resource Strain: Not on file  Food Insecurity: Not on file  Transportation Needs: Not on file  Physical Activity: Not on file  Stress: Not on file  Social Connections: Not on file  Intimate Partner Violence: Not on file    Outpatient Medications Prior to Visit  Medication Sig Dispense Refill   acetaminophen (TYLENOL) 500 MG tablet Take 500 mg by mouth every 6 (six) hours as needed for moderate pain.     apixaban (ELIQUIS) 2.5 MG TABS tablet TAKE ONE TABLET (2.5MG  TOTAL) BY MOUTH TWO TIMES DAILY 180 tablet 1   atorvastatin (LIPITOR) 80 MG tablet TAKE ONE TABLET (  TOTAL) BY MOUTH DAILY 90 tablet 3   calcium carbonate (TUMS EX) 750 MG chewable tablet Chew 1 tablet by mouth daily as needed for heartburn.     carvedilol (COREG) 6.25 MG tablet TAKE ONE TABLET (6.25MG  TOTAL) BY MOUTH TWO TIMES DAILY WITH A MEAL 180 tablet 3   cetirizine (ZYRTEC) 10 MG tablet Take 10 mg by mouth daily as needed for allergies.     Continuous Blood Gluc Sensor (DEXCOM G7 SENSOR) MISC Inject 1 Application into the skin as directed. Change sensor every 10 days as directed. 9 each 3   glucose blood (ACCU-CHEK GUIDE) test strip Use as instructed to monitor glucose 4 times daily, before meals and before bed. 400 each 12   hydrALAZINE (APRESOLINE) 25 MG tablet Take 1 tablet (25 mg total) by mouth in the morning and at bedtime. 180 tablet 3   insulin aspart (NOVOLOG) 100 UNIT/ML FlexPen Inject 1-7 Units into the skin  3 (three) times daily with meals. 15 mL 3   Insulin Pen Needle (UNIFINE PENTIPS) 31G X 8 MM MISC USE TO INJECT INSULIN SUBCUTANEOUSLY . USE FOR BOTH SLIDING SCALE AND  FOR DAILYINSULIN DOSES. 100 each 11   linaclotide (LINZESS) 290 MCG CAPS capsule Take 1 capsule (290 mcg total) by mouth daily before breakfast. 90 capsule 3   Misc. Devices MISC Wheelchair cushion - small. 1 each 0   omeprazole (PRILOSEC) 20 MG capsule Take 1 capsule (20 mg total) by mouth 2 (two) times daily before a meal. 60 capsule 3   ondansetron (ZOFRAN) 4 MG tablet TAKE ONE TABLET BY MOUTH ( ) EVERY 4 TO 6 HOURS AS NEEDED FOR NAUSEA OR VOMITING 90 tablet 1   oxyCODONE-acetaminophen (PERCOCET) 10-325 MG tablet Take 0.5 tablets every 4 (four) hours as needed by mouth for pain. (Patient taking differently: Take 1 tablet by mouth every 4 (four) hours as needed for pain.) 120 tablet 0   RITUXAN 500 MG/50ML injection Inject 100 mLs (1,000 mg total) into the vein every 14 (fourteen) days. Courier to [facility]. [address] [place] [address] Attn: Serafina Royals. (pharmacy dept) 200 mL 0   simethicone (GAS-X) 80 MG chewable tablet Chew 80 mg by mouth every 6 (six) hours as needed for flatulence.     TRESIBA FLEXTOUCH 100 UNIT/ML FlexTouch Pen Inject 10 Units into the skin at bedtime. 9 mL 3   mirtazapine (REMERON) 7.5 MG tablet Take 1 tablet (7.5 mg total) by mouth at bedtime. 30 tablet 5   No facility-administered medications prior to visit.    Allergies  Allergen Reactions   Losartan Swelling   Nsaids     Kidney disease    ROS Review of Systems  Constitutional:  Negative for chills and fever.  HENT:  Negative for congestion, sinus pressure, sinus pain and sore throat.   Eyes:  Positive for visual disturbance. Negative for pain and discharge.  Respiratory:  Negative for cough and shortness of breath.   Cardiovascular:  Negative for chest pain and palpitations.  Gastrointestinal:  Negative for abdominal pain, nausea and vomiting.  Endocrine: Negative for polydipsia and polyuria.  Genitourinary:  Negative for dysuria and hematuria.  Musculoskeletal:  Positive for arthralgias and  back pain. Negative for neck pain and neck stiffness.  Skin:  Negative for rash.  Neurological:  Negative for dizziness and weakness.  Psychiatric/Behavioral:  Negative for agitation and behavioral problems. The patient is nervous/anxious.       Objective:    Physical Exam Vitals reviewed.  Constitutional:      General: She is not in acute distress.    Appearance: She is cachectic. She is not diaphoretic.     Comments: In wheelchair  HENT:     Head: Normocephalic and atraumatic.     Nose: Nose normal.     Mouth/Throat:     Mouth: Mucous membranes are moist.  Eyes:     General: No scleral icterus.    Extraocular Movements: Extraocular movements intact.  Cardiovascular:     Rate and Rhythm: Normal rate and regular rhythm.     Pulses: Normal pulses.     Heart sounds: Normal heart sounds. No murmur heard. Pulmonary:     Breath sounds: Normal breath sounds. No wheezing or rales.  Musculoskeletal:     Cervical back: Neck supple. No tenderness.     Comments: S/p left BKA, prosthesis in place  Skin:    General: Skin is warm.  Neurological:  General: No focal deficit present.     Mental Status: She is alert and oriented to person, place, and time.     Sensory: Sensory deficit present.     Motor: Weakness (Right LE) present.  Psychiatric:        Mood and Affect: Mood normal.        Behavior: Behavior normal.     BP (!) 108/59 (BP Location: Left Arm, Patient Position: Sitting, Cuff Size: Normal)   Pulse 73   Ht 5\' 1"  (1.549 m)   Wt 101 lb (45.8 kg)   LMP 12/28/2010   SpO2 95%   BMI 19.08 kg/m  Wt Readings from Last 3 Encounters:  05/05/22 101 lb (45.8 kg)  04/19/22 101 lb (45.8 kg)  03/29/22 102 lb 3.2 oz (46.4 kg)    Lab Results  Component Value Date   TSH 3.070 04/09/2021   Lab Results  Component Value Date   WBC 8.5 02/11/2022   HGB 13.1 02/11/2022   HCT 39.7 02/11/2022   MCV 93 02/11/2022   PLT 261 02/11/2022   Lab Results  Component Value Date    NA 135 02/18/2022   K 5.5 (H) 02/18/2022   CO2 18 (L) 02/18/2022   GLUCOSE 415 (H) 02/18/2022   BUN 50 (H) 02/18/2022   CREATININE 2.58 (H) 02/18/2022   BILITOT 0.3 02/11/2022   ALKPHOS 110 02/11/2022   AST 17 02/11/2022   ALT 14 02/11/2022   PROT 6.5 02/11/2022   ALBUMIN 4.1 02/11/2022   CALCIUM 8.8 02/18/2022   ANIONGAP 4 (L) 12/08/2021   EGFR 22 (L) 02/18/2022   Lab Results  Component Value Date   CHOL 151 04/09/2021   Lab Results  Component Value Date   HDL 59 04/09/2021   Lab Results  Component Value Date   LDLCALC 77 04/09/2021   Lab Results  Component Value Date   TRIG 75 04/09/2021   Lab Results  Component Value Date   CHOLHDL 2.6 04/09/2021   Lab Results  Component Value Date   HGBA1C 10.3 (A) 03/29/2022      Assessment & Plan:   Problem List Items Addressed This Visit       Cardiovascular and Mediastinum   Coronary artery disease involving native heart without angina pectoris    S/p stent to LAD Not on any antiplatelet, on Eliquis Follows up with Cardiologist      PAD (peripheral artery disease) (HCC)    Related to type 1 DM S/p left AKA On Eliquis due to coagulopathy      Essential hypertension - Primary    BP Readings from Last 1 Encounters:  05/05/22 (!) 108/59  Usually well-controlled at home Counseled for compliance with the medications Advised DASH diet        Endocrine   Diabetes mellitus type I    Lab Results  Component Value Date   HGBA1C 10.3 (A) 03/29/2022  On Tresiba 10 U qHS and ISS, followed by Endocrinology Advised to follow diabetic diet - needs to eat at regular intervals and take mealtime insulin On statin Diabetic eye exam: In 10/2019, no retinopathy        Nervous and Auditory   Hemiplegia, unspecified affecting right dominant side    S/p CVA Has left BKA Wheelchair-bound currently, prescription for wheelchair cushion faxed        Genitourinary   Stage 4 chronic kidney disease    Related to  uncontrolled type 2 DM Progressive CKD Followed by Nephrology  Membranous glomerulonephritis    Has history of diabetic CKD Recent renal biopsy showed membranous glomerulopathy Followed by Nephrology She has opted to avoid immunosuppressive treatment for now        Other   GAD (generalized anxiety disorder) (Chronic)    Considering her weight loss due to lack of appetite, continue Remeron at a lower dose now - did not tolerate 15 mg dose Her chronic medical conditions are also contributing to her depression and anxiety Did not tolerate Elavil      Relevant Medications   mirtazapine (REMERON) 7.5 MG tablet   Hx of BKA, left    For history of PVD Referred to prosthetic clinic for adjustment of prosthetic and she has been elevating prosthetic and has difficulty moving her leg      Relevant Orders   Ambulatory referral to Orthopedic Surgery   Moderate protein-calorie malnutrition    Due to poor p.o. intake Needs to eat at regular intervals and take mealtime insulin Needs to continue Remeron Needs to take protein supplement daily      Relevant Medications   mirtazapine (REMERON) 7.5 MG tablet    Meds ordered this encounter  Medications   mirtazapine (REMERON) 7.5 MG tablet    Sig: Take 1 tablet (7.5 mg total) by mouth at bedtime.    Dispense:  30 tablet    Refill:  5    Follow-up: Return in about 4 months (around 09/04/2022) for DM and weight check.    Anabel Halon, MD

## 2022-05-05 NOTE — Assessment & Plan Note (Signed)
Related to type 1 DM °S/p left AKA °On Eliquis due to coagulopathy °

## 2022-05-05 NOTE — Assessment & Plan Note (Signed)
S/p stent to LAD Not on any antiplatelet, on Eliquis Follows up with Cardiologist 

## 2022-05-05 NOTE — Assessment & Plan Note (Signed)
BP Readings from Last 1 Encounters:  05/05/22 (!) 108/59   Usually well-controlled at home Counseled for compliance with the medications Advised DASH diet

## 2022-05-05 NOTE — Assessment & Plan Note (Addendum)
Lab Results  Component Value Date   HGBA1C 10.3 (A) 03/29/2022   On Tresiba 10 U qHS and ISS, followed by Endocrinology Advised to follow diabetic diet - needs to eat at regular intervals and take mealtime insulin On statin Diabetic eye exam: In 10/2019, no retinopathy

## 2022-05-05 NOTE — Patient Instructions (Addendum)
Please continue to take medications as prescribed.  Please continue to follow low carb diet and ambulate as tolerated. 

## 2022-05-05 NOTE — Assessment & Plan Note (Signed)
S/p CVA Has left BKA Wheelchair-bound currently, prescription for wheelchair cushion faxed 

## 2022-05-07 DIAGNOSIS — E440 Moderate protein-calorie malnutrition: Secondary | ICD-10-CM | POA: Insufficient documentation

## 2022-05-07 NOTE — Assessment & Plan Note (Signed)
Due to poor p.o. intake Needs to eat at regular intervals and take mealtime insulin Needs to continue Remeron Needs to take protein supplement daily

## 2022-05-07 NOTE — Assessment & Plan Note (Signed)
Considering her weight loss due to lack of appetite, continue Remeron at a lower dose now - did not tolerate 15 mg dose Her chronic medical conditions are also contributing to her depression and anxiety Did not tolerate Elavil

## 2022-05-07 NOTE — Assessment & Plan Note (Addendum)
Has history of diabetic CKD Recent renal biopsy showed membranous glomerulopathy Followed by Nephrology She has opted to avoid immunosuppressive treatment for now

## 2022-05-07 NOTE — Assessment & Plan Note (Addendum)
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

## 2022-05-07 NOTE — Assessment & Plan Note (Signed)
For history of PVD Referred to prosthetic clinic for adjustment of prosthetic and she has been elevating prosthetic and has difficulty moving her leg 

## 2022-05-10 ENCOUNTER — Telehealth: Payer: Self-pay | Admitting: Internal Medicine

## 2022-05-10 NOTE — Telephone Encounter (Signed)
Patient called about referral from last week returning a call that was setup for [facility], patient call back # [phone].

## 2022-05-11 ENCOUNTER — Encounter: Payer: Self-pay | Admitting: Internal Medicine

## 2022-05-11 ENCOUNTER — Telehealth: Payer: Self-pay | Admitting: Internal Medicine

## 2022-05-11 NOTE — Telephone Encounter (Signed)
[facility] called in regard to patient referral.  States they need prescription / order for pt prosthetics.

## 2022-05-13 ENCOUNTER — Telehealth: Payer: Self-pay

## 2022-05-13 ENCOUNTER — Other Ambulatory Visit (HOSPITAL_COMMUNITY): Payer: Self-pay

## 2022-05-13 NOTE — Telephone Encounter (Signed)
PA request received via CMM for Tresiba FlexTouch (insulin degludec injection) 100 Units/mL solution  PA submitted to [employer] and has been APPROVED from 05/13/2022 - 05/13/2023  Confirmation: [id number] W

## 2022-05-13 NOTE — Telephone Encounter (Signed)
PA has been APPROVED from 05/13/2022-05/13/2023 

## 2022-05-13 NOTE — Telephone Encounter (Signed)
PA request received via CMM for Dexcom G7 Sensor  PA has been submitted via [employer] and is pending determination  *most recent chart attached to request  Confirmation: [id number] W

## 2022-05-19 ENCOUNTER — Encounter: Payer: Self-pay | Admitting: Nurse Practitioner

## 2022-05-19 MED ORDER — DEXCOM G7 RECEIVER DEVI: 1.0000 | Freq: Once | 0 refills | Status: AC

## 2022-05-19 MED ORDER — TRESIBA FLEXTOUCH 100 UNIT/ML ~~LOC~~ SOPN: 15.0000 [IU] | PEN_INJECTOR | Freq: Every day | SUBCUTANEOUS | 3 refills | Status: DC

## 2022-05-19 NOTE — Addendum Note (Signed)
Addended by: Dani Gobble on: 05/19/2022 02:20 PM   Modules accepted: Orders

## 2022-05-20 ENCOUNTER — Other Ambulatory Visit: Payer: Self-pay | Admitting: Nurse Practitioner

## 2022-05-20 ENCOUNTER — Other Ambulatory Visit (HOSPITAL_COMMUNITY): Payer: Self-pay

## 2022-05-20 ENCOUNTER — Telehealth: Payer: Self-pay | Admitting: Pharmacy Technician

## 2022-05-20 ENCOUNTER — Other Ambulatory Visit: Payer: Self-pay

## 2022-05-20 MED ORDER — TRESIBA FLEXTOUCH 100 UNIT/ML ~~LOC~~ SOPN: 17.0000 [IU] | PEN_INJECTOR | Freq: Every day | SUBCUTANEOUS | 3 refills | Status: DC

## 2022-05-20 MED ORDER — TRESIBA FLEXTOUCH 100 UNIT/ML ~~LOC~~ SOPN: 15.0000 [IU] | PEN_INJECTOR | Freq: Every day | SUBCUTANEOUS | 3 refills | Status: DC

## 2022-05-20 NOTE — Telephone Encounter (Signed)
Pharmacy Patient Advocate Encounter   Received notification from Pt Advice requests/NP that prior authorization for Dexcom Receiver is required/requested.   PA submitted on 05/20/22 to (ins) [place] Medicaid via [employer] or [facility]) confirmation # [id number]   Prior Authorization has been approved  PA # [id number] Effective dates: 05/20/22 through 11/16/22

## 2022-05-20 NOTE — Telephone Encounter (Signed)
Patient's daughter ws called and made aware.

## 2022-05-20 NOTE — Telephone Encounter (Signed)
Did you receive PA request for her Dexcom G7 receiver?  The sensors were approved but she now needs a PA for the receiver (her current phone is not compatible).

## 2022-05-20 NOTE — Telephone Encounter (Signed)
PA request submitted. New Encounter created for follow up. For additional info see Pharmacy Prior Auth telephone encounter from 05/20/22.

## 2022-05-20 NOTE — Telephone Encounter (Signed)
Will you let the daughter know that her receiver was approved?

## 2022-05-21 ENCOUNTER — Other Ambulatory Visit: Payer: Self-pay

## 2022-05-21 ENCOUNTER — Encounter: Payer: Self-pay | Admitting: Internal Medicine

## 2022-05-21 DIAGNOSIS — Z89512 Acquired absence of left leg below knee: Secondary | ICD-10-CM

## 2022-05-21 DIAGNOSIS — T879 Unspecified complications of amputation stump: Secondary | ICD-10-CM

## 2022-05-24 ENCOUNTER — Telehealth: Payer: Self-pay | Admitting: Internal Medicine

## 2022-05-24 NOTE — Telephone Encounter (Signed)
[facility] called on patient behalf.  Needs new prescription on what provider wants patient to receive.  Last did not contain this info.  Fax [phone]

## 2022-05-25 NOTE — Telephone Encounter (Signed)
Spoke to [facility]

## 2022-05-26 ENCOUNTER — Other Ambulatory Visit: Payer: Self-pay | Admitting: Gastroenterology

## 2022-07-08 ENCOUNTER — Ambulatory Visit: Payer: Medicaid Other | Admitting: Nurse Practitioner

## 2022-07-08 ENCOUNTER — Encounter: Payer: Self-pay | Admitting: Nurse Practitioner

## 2022-07-08 DIAGNOSIS — E782 Mixed hyperlipidemia: Secondary | ICD-10-CM

## 2022-07-08 DIAGNOSIS — E1059 Type 1 diabetes mellitus with other circulatory complications: Secondary | ICD-10-CM

## 2022-07-08 DIAGNOSIS — I10 Essential (primary) hypertension: Secondary | ICD-10-CM

## 2022-07-08 DIAGNOSIS — Z794 Long term (current) use of insulin: Secondary | ICD-10-CM

## 2022-07-08 DIAGNOSIS — E559 Vitamin D deficiency, unspecified: Secondary | ICD-10-CM

## 2022-07-08 NOTE — Telephone Encounter (Signed)
See patient note

## 2022-07-17 ENCOUNTER — Other Ambulatory Visit: Payer: Self-pay | Admitting: Cardiology

## 2022-07-19 NOTE — Telephone Encounter (Signed)
Prescription refill request for Eliquis received. Indication: Lupus anticoag +/CM Last office visit: 04/19/22  Ival Bible MD Scr: 2.85 on 05/19/22 Age: 52 Weight: 45.8kg  Based on above findings Eliquis 2.5mg  twice daily is the appropriate dose.  Refill approved.

## 2022-08-03 ENCOUNTER — Other Ambulatory Visit: Payer: Self-pay

## 2022-08-09 LAB — COMPREHENSIVE METABOLIC PANEL: eGFR: 18

## 2022-08-09 LAB — BASIC METABOLIC PANEL
BUN: 58 — AB (ref 4–21)
Creatinine: 3.1 — AB (ref 0.5–1.1)

## 2022-08-09 LAB — CBC AND DIFFERENTIAL
HCT: 32 — AB (ref 36–46)
Hemoglobin: 10.9 — AB (ref 12.0–16.0)

## 2022-08-09 LAB — VITAMIN D 25 HYDROXY (VIT D DEFICIENCY, FRACTURES): Vit D, 25-Hydroxy: 20.2

## 2022-08-09 LAB — HEMOGLOBIN A1C: Hemoglobin A1C: 8.4

## 2022-08-09 LAB — MICROALBUMIN / CREATININE URINE RATIO: Microalb Creat Ratio: 8906

## 2022-08-09 LAB — PROTEIN / CREATININE RATIO, URINE: Creatinine, Urine: 60.4

## 2022-08-12 ENCOUNTER — Other Ambulatory Visit (HOSPITAL_COMMUNITY): Payer: Self-pay

## 2022-08-23 ENCOUNTER — Ambulatory Visit (INDEPENDENT_AMBULATORY_CARE_PROVIDER_SITE_OTHER): Payer: Medicaid Other | Admitting: Nurse Practitioner

## 2022-08-23 ENCOUNTER — Encounter: Payer: Self-pay | Admitting: Nurse Practitioner

## 2022-08-23 VITALS — BP 138/80 | HR 88 | Ht 61.0 in | Wt 104.4 lb

## 2022-08-23 DIAGNOSIS — E782 Mixed hyperlipidemia: Secondary | ICD-10-CM | POA: Diagnosis not present

## 2022-08-23 DIAGNOSIS — E559 Vitamin D deficiency, unspecified: Secondary | ICD-10-CM | POA: Diagnosis not present

## 2022-08-23 DIAGNOSIS — E1059 Type 1 diabetes mellitus with other circulatory complications: Secondary | ICD-10-CM

## 2022-08-23 DIAGNOSIS — I10 Essential (primary) hypertension: Secondary | ICD-10-CM

## 2022-08-23 DIAGNOSIS — Z794 Long term (current) use of insulin: Secondary | ICD-10-CM

## 2022-08-23 MED ORDER — TRESIBA FLEXTOUCH 100 UNIT/ML ~~LOC~~ SOPN: 17.0000 [IU] | PEN_INJECTOR | Freq: Every day | SUBCUTANEOUS | 3 refills | Status: DC

## 2022-08-23 MED ORDER — UNIFINE PENTIPS 31G X 8 MM MISC: 11 refills | Status: DC

## 2022-08-23 MED ORDER — INSULIN ASPART 100 UNIT/ML FLEXPEN: 1.0000 [IU] | PEN_INJECTOR | Freq: Three times a day (TID) | SUBCUTANEOUS | 3 refills | Status: DC

## 2022-08-23 NOTE — Progress Notes (Signed)
Endocrinology Follow Up Note       08/23/2022, 1:25 PM   Subjective:    Patient ID: [name], female    DOB: August 11, 1970.  [name] is being seen in follow up after being seen in consultation for management of currently uncontrolled symptomatic diabetes requested by  Anabel Halon, MD.   Past Medical History:  Diagnosis Date   Acquired absence of left leg below knee (HCC)    Anxiety    was on xanax for years but discontinued prior to prior PCP retiring as she was on opioids and was told she would not find someone to write for both   Arterial thromboembolism (HCC)    Prior embolectomy, previously on Coumadin   Cataract    Phreesia 11/19/2019   Chronic kidney disease    Phreesia 11/19/2019   Clotting disorder (HCC)    Phreesia 11/19/2019   Coronary atherosclerosis of native coronary artery    a. s/p DES to LAD in 2014   Depression    Depression    Phreesia 11/19/2019   Diabetes mellitus without complication (HCC)    Phreesia 11/19/2019   Diabetic ketoacidosis with coma associated with type 1 diabetes mellitus (HCC)    DKA, type 1 (HCC) 11/17/2016   Facial cellulitis    12/2010   Glomerulonephritis    Headache(784.0)    Hemiplegia, unspecified affecting right dominant side (HCC)    History of stroke    Hyperlipidemia    Insulin dependent diabetes mellitus    History of diabetic ketoacidosis   Ischemic cardiomyopathy    a. EF 30-35% in 2018 b. EF at 45-50% by repeat imaging in 02/2019   Left carotid artery occlusion    Major depressive disorder, single episode, unspecified    N&V (nausea and vomiting) 06/03/2021   Pancreatitis, acute 11/07/2016   Peripheral arterial disease (HCC)    Shingles    11/2010   ST elevation myocardial infarction (STEMI) of anterior wall [facility])    Late presentation September 2014   [facility])    2018   Upper abdominal pain 06/03/2021    Past Surgical History:   Procedure Laterality Date   AMPUTATION  03/03/2011   Procedure: AMPUTATION DIGIT;  Surgeon: Nadara Mustard, MD;  Location: [facility] OR;  Service: Orthopedics;  Laterality: Left;  Left Foot Amputation 4th and 5th toes at MTP joint   AMPUTATION  04/22/2011   Procedure: AMPUTATION BELOW KNEE;  Surgeon: Nadara Mustard, MD;  Location: [facility] OR;  Service: Orthopedics;  Laterality: Left;  Left Below Knee Amputation   BIOPSY  07/17/2021   Procedure: BIOPSY;  Surgeon: Lanelle Bal, DO;  Location: [facility] SUITE;  Service: Endoscopy;;   CATARACT EXTRACTION W/PHACO Left 01/09/2020   Procedure: CATARACT EXTRACTION PHACO AND INTRAOCULAR LENS PLACEMENT (IOC);  Surgeon: Fabio Pierce, MD;  Location: [facility] ORS;  Service: Ophthalmology;  Laterality: Left;  CDE: 9.73   COLONOSCOPY WITH PROPOFOL N/A 12/14/2021   Procedure: COLONOSCOPY WITH PROPOFOL;  Surgeon: Lanelle Bal, DO;  [facility];  Service: Endoscopy;  Laterality: N/A;  9:45 am   DILATION AND CURETTAGE OF UTERUS     EMBOLECTOMY  01/29/2011   Procedure: EMBOLECTOMY;  Surgeon: Pryor Ochoa, MD;  Location: [facility] OR;  Service: Vascular;  Laterality: Left;  Left Popliteal and Tibial Embolectomy with patch angioplasty   ESOPHAGOGASTRODUODENOSCOPY (EGD) WITH PROPOFOL N/A 07/17/2021   Procedure: ESOPHAGOGASTRODUODENOSCOPY (EGD) WITH PROPOFOL;  Surgeon: Lanelle Bal, DO;  [facility];  Service: Endoscopy;  Laterality: N/A;  1:00pm   INCISION AND DRAINAGE ABSCESS N/A 04/06/2016   Procedure: INCISION AND DRAINAGE ABSCESS;  Surgeon: Tilda Burrow, MD;  Location: [facility] ORS;  Service: Gynecology;  Laterality: N/A;   LEFT HEART CATHETERIZATION WITH CORONARY ANGIOGRAM N/A 10/01/2012   Procedure: LEFT HEART CATHETERIZATION WITH CORONARY ANGIOGRAM;  Surgeon: Peter M Swaziland, MD;  Location: [facility] CATH LAB;  Service: Cardiovascular;  Laterality: N/A;   LOWER EXTREMITY ANGIOGRAM N/A 01/27/2011   Procedure: LOWER EXTREMITY ANGIOGRAM;  Surgeon: Larina Earthly, MD;   Location: [facility] CATH LAB;  Service: Cardiovascular;  Laterality: N/A;   LOWER EXTREMITY ANGIOGRAM N/A 01/28/2011   Procedure: LOWER EXTREMITY ANGIOGRAM;  Surgeon: Fransisco Hertz, MD;  Location: [facility] CATH LAB;  Service: Cardiovascular;  Laterality: N/A;   LOWER EXTREMITY ANGIOGRAM Left 01/29/2011   Procedure: LOWER EXTREMITY ANGIOGRAM;  Surgeon: Pryor Ochoa, MD;  Location: [facility] CATH LAB;  Service: Cardiovascular;  Laterality: Left;   MULTIPLE TOOTH EXTRACTIONS     POLYPECTOMY  12/14/2021   Procedure: POLYPECTOMY;  Surgeon: Lanelle Bal, DO;  [facility];  Service: Endoscopy;;   TEE WITHOUT CARDIOVERSION  04/15/2011   Procedure: TRANSESOPHAGEAL ECHOCARDIOGRAM (TEE);  Surgeon: Kathlen Brunswick, MD;  [facility];  Service: Cardiovascular;  Laterality: N/A;   WRIST SURGERY     Left    Social History   Socioeconomic History   Marital status: Single    Spouse name: Not on file   Number of children: Not on file   Years of education: Not on file   Highest education level: Not on file  Occupational History   Not on file  Tobacco Use   Smoking status: Every Day    Current packs/day: 0.50    Average packs/day: 0.5 packs/day for 33.1 years (16.6 ttl pk-yrs)    Types: Cigarettes    Start date: 07/06/1989   Smokeless tobacco: Never  Vaping Use   Vaping status: Never Used  Substance and Sexual Activity   Alcohol use: No    Alcohol/week: 0.0 standard drinks of alcohol    Comment: rarely   Drug use: Not Currently    Frequency: 1.0 times per week    Types: Marijuana   Sexual activity: Yes    Partners: Male    Comment: pt stated that her tubal was only 50% effective- "not cut and burned"  Other Topics Concern   Not on file  Social History Narrative   Unemployed.  Lives in [place] with [place], [place], and mother.  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   Social Determinants of Health   Financial Resource Strain: Not on file  Food Insecurity: Not on file   Transportation Needs: Not on file  Physical Activity: Not on file  Stress: Not on file  Social Connections: Not on file    Family History  Problem Relation Age of Onset   COPD Mother    Hypertension Mother    Diabetes Mother    Stroke Mother    Coronary artery disease Mother    Diabetes Father    Hypertension Father    Coronary artery disease Father    Kidney disease Father  on dialysis, died from heart attack while being dialzed   Pancreatitis Maternal Aunt    Cancer Maternal Aunt        unknown primary   Anesthesia problems Neg Hx    Colon cancer Neg Hx     Outpatient Encounter Medications as of 08/23/2022  Medication Sig   acetaminophen (TYLENOL) 500 MG tablet Take 500 mg by mouth every 6 (six) hours as needed for moderate pain.   atorvastatin (LIPITOR) 80 MG tablet TAKE ONE TABLET (80MG  TOTAL) BY MOUTH DAILY   calcium carbonate (TUMS EX) 750 MG chewable tablet Chew 1 tablet by mouth daily as needed for heartburn.   carvedilol (COREG) 6.25 MG tablet TAKE ONE TABLET (6.25MG  TOTAL) BY MOUTH TWO TIMES DAILY WITH A MEAL   cetirizine (ZYRTEC) 10 MG tablet Take 10 mg by mouth daily as needed for allergies.   Continuous Blood Gluc Sensor (DEXCOM G7 SENSOR) MISC Inject 1 Application into the skin as directed. Change sensor every 10 days as directed.   ELIQUIS 2.5 MG TABS tablet TAKE ONE TABLET (2.5MG  TOTAL) BY MOUTH TWO TIMES DAILY   glucose blood (ACCU-CHEK GUIDE) test strip Use as instructed to monitor glucose 4 times daily, before meals and before bed.   hydrALAZINE (APRESOLINE) 25 MG tablet Take 1 tablet (25 mg total) by mouth in the morning and at bedtime.   linaclotide (LINZESS) 290 MCG CAPS capsule Take 1 capsule (290 mcg total) by mouth daily before breakfast.   mirtazapine (REMERON) 7.5 MG tablet Take 1 tablet (7.5 mg total) by mouth at bedtime.   Misc. Devices MISC Wheelchair cushion - small.   omeprazole (PRILOSEC) 20 MG capsule Take 1 capsule (20 mg total) by mouth  2 (two) times daily before a meal.   ondansetron (ZOFRAN) 4 MG tablet TAKE ONE TABLET BY MOUTH (4MG ) EVERY 4 TO 6 HOURS AS NEEDED FOR NAUSEA OR VOMITING   oxyCODONE-acetaminophen (PERCOCET) 10-325 MG tablet Take 0.5 tablets every 4 (four) hours as needed by mouth for pain. (Patient taking differently: Take 1 tablet by mouth every 4 (four) hours as needed for pain.)   RITUXAN 500 MG/50ML injection Inject 100 mLs (1,000 mg total) into the vein every 14 (fourteen) days. Courier to [facility]. [address] [place] [address] Attn: Serafina Royals. (pharmacy dept)   simethicone (GAS-X) 80 MG chewable tablet Chew 80 mg by mouth every 6 (six) hours as needed for flatulence.   [DISCONTINUED] insulin aspart (NOVOLOG) 100 UNIT/ML FlexPen Inject 1-7 Units into the skin 3 (three) times daily with meals.   [DISCONTINUED] Insulin Pen Needle (UNIFINE PENTIPS) 31G X 8 MM MISC USE TO INJECT INSULIN SUBCUTANEOUSLY . USE FOR BOTH SLIDING SCALE AND FOR DAILYINSULIN DOSES.   [DISCONTINUED] TRESIBA FLEXTOUCH 100 UNIT/ML FlexTouch Pen Inject 17 Units into the skin at bedtime.   insulin aspart (NOVOLOG) 100 UNIT/ML FlexPen Inject 1-7 Units into the skin 3 (three) times daily with meals.   Insulin Pen Needle (UNIFINE PENTIPS) 31G X 8 MM MISC USE TO INJECT INSULIN SUBCUTANEOUSLY . USE FOR BOTH SLIDING SCALE AND FOR DAILYINSULIN DOSES.   TRESIBA FLEXTOUCH 100 UNIT/ML FlexTouch Pen Inject 17 Units into the skin at bedtime.   No facility-administered encounter medications on file as of 08/23/2022.    ALLERGIES: Allergies  Allergen Reactions   Losartan Swelling   Nsaids     Kidney disease    VACCINATION STATUS: Immunization History  Administered Date(s) Administered   Influenza, Seasonal, Injecte, Preservative Fre 11/23/2018   Influenza,inj,Quad  PF,6+ Mos 10/24/2014, 11/28/2019, 11/13/2020, 12/31/2021   Influenza-Unspecified 11/08/2011, 01/15/2013, 11/20/2015   Pneumococcal Polysaccharide-23 11/08/2016     Diabetes She presents for her follow-up diabetic visit. She has type 1 diabetes mellitus. Onset time: She was diagnosed at approximate age of 26. Her disease course has been fluctuating. There are no hypoglycemic associated symptoms. Associated symptoms include blurred vision, fatigue, foot paresthesias, polydipsia, polyphagia and polyuria. Pertinent negatives for diabetes include no weight loss. There are no hypoglycemic complications. (Has has several hypoglycemic events in the past where EMS was called.  She is extremely scared of becoming hypoglycemic again.) Symptoms are stable. Diabetic complications include a CVA, heart disease, nephropathy, peripheral neuropathy and PVD. Risk factors for coronary artery disease include diabetes mellitus, dyslipidemia, hypertension, sedentary lifestyle and tobacco exposure. Current diabetic treatment includes intensive insulin program. She is compliant with treatment most of the time. Her weight is fluctuating minimally. She is following a generally unhealthy diet. When asked about meal planning, she reported none. She has not had a previous visit with a dietitian. She never (Has L AKA with prosthesis) participates in exercise. Her home blood glucose trend is fluctuating dramatically. Her overall blood glucose range is >200 mg/dl. (She presents today, accompanied by her daughter, with her glucose readings showing fluctuating glycemic profile but improved overall.  Her previsit A1c on 7/22 was 8.4%, improving from last visit of 10.3%.  Analysis of her CGM shows TIR 39%, TAR 59%, TBR 2% with a GMI of 8.2%.  ) An ACE inhibitor/angiotensin II receptor blocker is not being taken. She sees a podiatrist.Eye exam is current.  Hyperlipidemia This is a chronic problem. The current episode started more than 1 year ago. The problem is uncontrolled. Recent lipid tests were reviewed and are variable. Exacerbating diseases include chronic renal disease and diabetes. Factors  aggravating her hyperlipidemia include beta blockers, smoking and fatty foods. Current antihyperlipidemic treatment includes statins. Compliance problems include adherence to diet and adherence to exercise.  Risk factors for coronary artery disease include diabetes mellitus, dyslipidemia, hypertension and a sedentary lifestyle.  Hypertension This is a chronic problem. The current episode started more than 1 year ago. The problem has been waxing and waning since onset. The problem is uncontrolled. Associated symptoms include blurred vision. There are no associated agents to hypertension. Risk factors for coronary artery disease include diabetes mellitus, dyslipidemia, smoking/tobacco exposure and sedentary lifestyle. Past treatments include beta blockers. The current treatment provides mild improvement. Compliance problems include diet and exercise.  Hypertensive end-organ damage includes kidney disease, CAD/MI, CVA, heart failure and PVD. Identifiable causes of hypertension include chronic renal disease.    Review of systems  Constitutional: +minimally fluctuating body weight,  current Body mass index is 19.73 kg/m. 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), no xerophthalmia ENT: no sore throat, no nodules palpated in throat, no dysphagia/odynophagia, no hoarseness Cardiovascular: no chest pain, no shortness of breath, no palpitations, no leg swelling Respiratory: no cough, no shortness of breath Gastrointestinal: no nausea/vomiting/diarrhea Genitourinary: +polyuria, + polyphagia Musculoskeletal: no muscle/joint aches, Hx of L AKA- with new prosthesis Skin: no rashes, no hyperemia Neurological: no tremors, no numbness, no tingling, no dizziness Psychiatric: no depression, no anxiety   Objective:    BP 138/80 (BP Location: Left Arm, Patient Position: Sitting, Cuff Size: Normal) Comment: Retake  manuel cuff  Pulse 88   Ht 5\' 1"  (1.549 m)   Wt 104 lb 6.4 oz (  47.4 kg)   LMP 12/28/2010   BMI 19.73 kg/m   Wt Readings from Last 3 Encounters:  08/23/22 104 lb 6.4 oz (47.4 kg)  05/05/22 101 lb (45.8 kg)  04/19/22 101 lb (45.8 kg)    BP Readings from Last 3 Encounters:  08/23/22 138/80  05/05/22 (!) 108/59  04/19/22 134/80    Physical Exam- Limited  Constitutional:  Body mass index is 19.73 kg/m. , not in acute distress, mildly anxious state of mind, tearful during visit, irritable Eyes:  EOMI, no exophthalmos Musculoskeletal: L AKA with new prosthesis, strength intact in all four extremities, no gross restriction of joint movements Skin:  no rashes, no hyperemia, + nicotinic discoloration to fingernails Neurological: no tremor with outstretched hands    CMP ( most recent) CMP     Component Value Date/Time   NA 135 02/18/2022 1451   K 5.5 (H) 02/18/2022 1451   CL 101 02/18/2022 1451   CO2 18 (L) 02/18/2022 1451   GLUCOSE 415 (H) 02/18/2022 1451   GLUCOSE 172 (H) 12/08/2021 1015   BUN 58 (A) 08/09/2022 0000   CREATININE 3.1 (A) 08/09/2022 0000   CREATININE 2.58 (H) 02/18/2022 1451   CALCIUM 8.8 02/18/2022 1451   PROT 6.5 02/11/2022 1230   ALBUMIN 4.1 02/11/2022 1230   AST 17 02/11/2022 1230   ALT 14 02/11/2022 1230   ALKPHOS 110 02/11/2022 1230   BILITOT 0.3 02/11/2022 1230   GFRNONAA 21 (L) 12/08/2021 1015   GFRAA 28 (L) 09/28/2019 1456     Diabetic Labs (most recent): Lab Results  Component Value Date   HGBA1C 8.4 08/09/2022   HGBA1C 10.3 (A) 03/29/2022   HGBA1C 10.5 (A) 12/23/2021     Lipid Panel ( most recent) Lipid Panel     Component Value Date/Time   CHOL 151 04/09/2021 1227   TRIG 75 04/09/2021 1227   HDL 59 04/09/2021 1227   CHOLHDL 2.6 04/09/2021 1227   CHOLHDL 4.2 11/07/2016 0722   VLDL 30 11/07/2016 0722   LDLCALC 77 04/09/2021 1227   LABVLDL 15 04/09/2021 1227       Assessment & Plan:   1) Type 1 diabetes mellitus with other  circulatory complication (HCC)  She presents today, accompanied by her daughter, with her glucose readings showing fluctuating glycemic profile but improved overall.  Her previsit A1c on 7/22 was 8.4%, improving from last visit of 10.3%.  Analysis of her CGM shows TIR 39%, TAR 59%, TBR 2% with a GMI of 8.2%.    - RILEI LIGHTCAP has currently uncontrolled symptomatic type 2 DM since 52 years of age.  Recent labs reviewed.  - I had a long discussion with her about the progressive nature of diabetes and the pathology behind its complications. -her diabetes is complicated by CAD, PVD, CKD, CVA, MI, DKA and she remains at a high risk for more acute and chronic complications which include retinopathy, and neuropathy. These are all discussed in detail with her.  - Nutritional counseling repeated at each appointment due to patients tendency to fall back in to old habits.  - The patient admits there is a room for improvement in their diet and drink choices. -  Suggestion is made for the patient to avoid simple carbohydrates from their diet including Cakes, Sweet Desserts / Pastries, Ice Cream, Soda (diet and regular), Sweet Tea, Candies, Chips, Cookies, Sweet Pastries, Store Bought Juices, Alcohol in Excess of 1-2 drinks a day, Artificial Sweeteners, Coffee Creamer, and "Sugar-free" Products. This will help  patient to have stable blood glucose profile and potentially avoid unintended weight gain.   - I encouraged the patient to switch to unprocessed or minimally processed complex starch and increased protein intake (animal or plant source), fruits, and vegetables.   - Patient is advised to stick to a routine mealtimes to eat 3 meals a day and avoid unnecessary snacks (to snack only to correct hypoglycemia).  - she will be scheduled with Norm Salt, RDN, CDE for diabetes education.  - I have approached her with the following individualized plan to manage  her diabetes and patient agrees:    -She is  advised to lower her Tresiba to 12 units SQ nightly (may help with the drastic fluctuations).  She can continue her Novolog 1-7 units TID with meals if glucose is above 90 and she is eating (Specific instructions on how to titrate insulin dosage based on glucose readings given to patient in writing).  She is limited by her lack of meal routine.     -She is encouraged to continue monitoring blood glucose 4 times per day using her CGM (which she was recently approved for), before meals and before bed, and notify the clinic if she has readings less than 70 or greater than 300 for 3 tests in a row.  She is benefiting from CGM device, is advised to continue using.  - she is warned not to take insulin without proper monitoring per orders.  - Adjustment parameters are given to her for hypo and hyperglycemia in writing.  - Specific targets for  A1c;  LDL, HDL,  and Triglycerides were discussed with the patient.  2) Blood Pressure /Hypertension:  Her blood pressure is controlled to target.  She is advised to continue Carvedilol 6.25 mg po twice daily and Hydralazine 12.5 mg po twice daily.  Will defer any med changes to nephrology.  3) Lipids/Hyperlipidemia:  Her most recent lipid panel from 04/09/21 shows controlled LDL of 77.5.  She is advised to continue Lipitor 80 mg po daily at bedtime.  Side effects and precautions discussed with her.  She is also advised to avoid fried foods and butter.  4)  Weight/Diet: Her Body mass index is 19.73 kg/m.  -  she is not a candidate for weight loss.  Exercise, and detailed carbohydrates information provided  -  detailed on discharge instructions.  5) Vitamin D deficiency Her most recent vitamin D level was 16.2 on 04/09/21.  She is not currently on supplementation.    6) Chronic Care/Health Maintenance: -she is on Statin medications and is encouraged to initiate and continue to follow up with Ophthalmology, Dentist,  Podiatrist at least yearly or according to  recommendations, and advised to QUIT SMOKING. I have recommended yearly flu vaccine and pneumonia vaccine at least every 5 years; moderate intensity exercise for up to 150 minutes weekly; and  sleep for at least 7 hours a day.  Smoking cessation instruction/counseling given:  counseled patient on the dangers of tobacco use, advised patient to stop smoking, and reviewed strategies to maximize success   - she is advised to maintain close follow up with Anabel Halon, MD for primary care needs, as well as her other providers for optimal and coordinated care. -filled out forms for her to be able to get diabetic shoes.     I spent  28  minutes in the care of the patient today including review of labs from CMP, Lipids, Thyroid Function, Hematology (current and previous including abstractions from other  facilities); face-to-face time discussing  her blood glucose readings/logs, discussing hypoglycemia and hyperglycemia episodes and symptoms, medications doses, her options of short and long term treatment based on the latest standards of care / guidelines;  discussion about incorporating lifestyle medicine;  and documenting the encounter. Risk reduction counseling performed per USPSTF guidelines to reduce obesity and cardiovascular risk factors.     Please refer to Patient Instructions for Blood Glucose Monitoring and Insulin/Medications Dosing Guide"  in media tab for additional information. Please  also refer to " Patient Self Inventory" in the Media  tab for reviewed elements of pertinent patient history.  Mervyn Skeeters participated in the discussions, expressed understanding, and voiced agreement with the above plans.  All questions were answered to her satisfaction. she is encouraged to contact clinic should she have any questions or concerns prior to her return visit.   Follow up plan: - Return in about 3 months (around 11/23/2022) for Diabetes F/U with A1c in office, No previsit labs, Bring meter  and logs.   Ronny Bacon, [facility] [address] [place], [place] [address] Phone: [phone] Fax: [phone]  08/23/2022, 1:25 PM

## 2022-09-06 ENCOUNTER — Encounter: Payer: Self-pay | Admitting: Internal Medicine

## 2022-09-06 ENCOUNTER — Ambulatory Visit (INDEPENDENT_AMBULATORY_CARE_PROVIDER_SITE_OTHER): Payer: Medicaid Other | Admitting: Internal Medicine

## 2022-09-06 VITALS — BP 158/62 | HR 89 | Ht 61.0 in | Wt 106.4 lb

## 2022-09-06 DIAGNOSIS — F411 Generalized anxiety disorder: Secondary | ICD-10-CM

## 2022-09-06 DIAGNOSIS — N750 Cyst of Bartholin's gland: Secondary | ICD-10-CM

## 2022-09-06 DIAGNOSIS — I10 Essential (primary) hypertension: Secondary | ICD-10-CM

## 2022-09-06 DIAGNOSIS — N184 Chronic kidney disease, stage 4 (severe): Secondary | ICD-10-CM

## 2022-09-06 DIAGNOSIS — I739 Peripheral vascular disease, unspecified: Secondary | ICD-10-CM | POA: Diagnosis not present

## 2022-09-06 DIAGNOSIS — I255 Ischemic cardiomyopathy: Secondary | ICD-10-CM | POA: Diagnosis not present

## 2022-09-06 DIAGNOSIS — E1059 Type 1 diabetes mellitus with other circulatory complications: Secondary | ICD-10-CM | POA: Diagnosis not present

## 2022-09-06 DIAGNOSIS — E440 Moderate protein-calorie malnutrition: Secondary | ICD-10-CM

## 2022-09-06 MED ORDER — MIRTAZAPINE 7.5 MG PO TABS
7.5000 mg | ORAL_TABLET | Freq: Every day | ORAL | 5 refills | Status: DC
Start: 2022-09-06 — End: 2023-01-06

## 2022-09-06 MED ORDER — SULFAMETHOXAZOLE-TRIMETHOPRIM 400-80 MG PO TABS: 1.0000 | ORAL_TABLET | Freq: Two times a day (BID) | ORAL | 0 refills | Status: DC

## 2022-09-06 MED ORDER — HYDRALAZINE HCL 10 MG PO TABS
10.0000 mg | ORAL_TABLET | Freq: Two times a day (BID) | ORAL | 3 refills | Status: DC
Start: 2022-09-06 — End: 2023-01-06

## 2022-09-06 NOTE — Assessment & Plan Note (Signed)
Related to type 1 DM S/p left AKA On Eliquis due to coagulopathy 

## 2022-09-06 NOTE — Assessment & Plan Note (Addendum)
Lab Results  Component Value Date   HGBA1C 8.4 08/09/2022   Uncontrolled, but improving On Tresiba 12 U qHS and ISS, followed by Endocrinology Advised to follow diabetic diet - needs to eat at regular intervals and take mealtime insulin On statin Diabetic eye exam: In 10/2019, no retinopathy

## 2022-09-06 NOTE — Patient Instructions (Addendum)
Please start taking Hydralazine 10 mg twice daily. Continue Carvedilol 6.25 mg twice daily.  Please start taking Remeron regularly.  Please start taking Bactrim as prescribed for Bartholin cyst. Please perform sitz bath.  Please continue to take other medications as prescribed.  Please continue to follow low carb diet.

## 2022-09-06 NOTE — Assessment & Plan Note (Signed)
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

## 2022-09-06 NOTE — Assessment & Plan Note (Signed)
Ischemic On Coreg 6.25 mg BID Follows up with Cardiologist

## 2022-09-06 NOTE — Assessment & Plan Note (Addendum)
BP Readings from Last 1 Encounters:  09/06/22 (!) 158/62   Uncontrolled with Coreg 6.25 mg twice daily Added hydralazine at lower dose-10 mg BID Counseled for compliance with the medications Advised DASH diet

## 2022-09-06 NOTE — Progress Notes (Signed)
Established Patient Office Visit  Subjective:  Patient ID: [name], female    DOB: 1970-08-28  Age: 52 y.o. MRN: [id number]  CC:  Chief Complaint  Patient presents with   Diabetes    Follow up    Weight Check    Follow up     HPI [name] is a 52 y.o. 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  Her daughter was present during the visit.  Type I DM: Her last HbA1C was 8.4, which is better than prior.  She has been taking [place] 12 units at bedtime and follows insulin sliding scale.  Her daughter checks her blood sugars before meals and at bedtime, which have been variable.  She has been following up with endocrinology for it.  Of note, she skips meals most of the days, and cannot take mealtime insulin due to it. She has slight improvement in appetite with Remeron.  HTN: Her blood pressure is elevated today.  Of note, she was having hypotensive episodes while taking hydralazine 25 mg twice daily.  She has been taking 1/2 tablet of hydralazine 25 mg, but has not taken it today.  She is still taking Coreg 6.25 mg twice daily.  Denies any headache, chest pain or dyspnea currently.  She also has progressive CKD, last CMP reviewed.  She denies any dysuria, hematuria or urinary hesitancy.  She follows up with Dr. Wolfgang Phoenix.  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  She was prescribed Rituxan, but she has opted to avoid it for now due to its side effects. She is planned to get AV fistula.  She also complains of severe spells of anxiety and insomnia.  She also reports anhedonia, fatigue and lack of concentration.  Denies SI or HI.  She also reports loss of weight in the past, but her  daughter reports that she has very poor p.o. intake. She was given Remeron 15 mg qHS, but had episodes of confusion with it.  She has been tolerating Remeron 7.5 mg dose.  Her weight has been stable compared to the last visit.  Her insomnia has also improved.  She complains of chronic low back pain.  She has difficulty ambulating, and is mostly wheelchair-bound or bedridden.  Her daughter helps her with ADLs.  Does not have any sacral or pressure ulcer currently.  She is on oxycodone for chronic pain.  She also has radiating symptoms to RLE, but did not like gabapentin or amitriptyline for neuropathy.  She has had adjustment in her prosthetic and has been feeling better now.  She reports having a painful cyst in the vaginal area.  Denies any fever or chills.  Denies any vaginal bleeding or discharge currently.   Past Medical History:  Diagnosis Date   Acquired absence of left leg below knee (HCC)    Anxiety    was on xanax for years but discontinued prior to prior PCP retiring as she was on opioids and was told she would not find someone to write for both   Arterial thromboembolism (HCC)    Prior embolectomy, previously on Coumadin   Cataract    Phreesia 11/19/2019   Chronic kidney disease    Phreesia 11/19/2019   Clotting disorder (  HCC)    Phreesia 11/19/2019   Coronary atherosclerosis of native coronary artery    a. s/p DES to LAD in 2014   Depression    Depression    Phreesia 11/19/2019   Diabetes mellitus without complication (HCC)    Phreesia 11/19/2019   Diabetic ketoacidosis with coma associated with type 1 diabetes mellitus (HCC)    DKA, type 1 (HCC) 11/17/2016   Facial cellulitis    12/2010   Glomerulonephritis    Headache(784.0)    Hemiplegia, unspecified affecting right dominant side (HCC)    History of stroke    Hyperlipidemia    Insulin dependent diabetes mellitus    History of diabetic ketoacidosis   Ischemic cardiomyopathy    a. EF 30-35% in 2018 b. EF at 45-50%  by repeat imaging in 02/2019   Left carotid artery occlusion    Major depressive disorder, single episode, unspecified    N&V (nausea and vomiting) 06/03/2021   Pancreatitis, acute 11/07/2016   Peripheral arterial disease (HCC)    Shingles    11/2010   ST elevation myocardial infarction (STEMI) of anterior wall [facility])    Late presentation September 2014   Stroke [facility])    2018   Upper abdominal pain 06/03/2021    Past Surgical History:  Procedure Laterality Date   AMPUTATION  03/03/2011   Procedure: AMPUTATION DIGIT;  Surgeon: Nadara Mustard, MD;  Location: [facility] OR;  Service: Orthopedics;  Laterality: Left;  Left Foot Amputation 4th and 5th toes at MTP joint   AMPUTATION  04/22/2011   Procedure: AMPUTATION BELOW KNEE;  Surgeon: Nadara Mustard, MD;  Location: [facility] OR;  Service: Orthopedics;  Laterality: Left;  Left Below Knee Amputation   BIOPSY  07/17/2021   Procedure: BIOPSY;  Surgeon: Lanelle Bal, DO;  Location: [facility] ENDO SUITE;  Service: Endoscopy;;   CATARACT EXTRACTION W/PHACO Left 01/09/2020   Procedure: CATARACT EXTRACTION PHACO AND INTRAOCULAR LENS PLACEMENT (IOC);  Surgeon: Fabio Pierce, MD;  Location: [facility] ORS;  Service: Ophthalmology;  Laterality: Left;  CDE: 9.73   COLONOSCOPY WITH PROPOFOL N/A 12/14/2021   Procedure: COLONOSCOPY WITH PROPOFOL;  Surgeon: Lanelle Bal, DO;  Location: [facility] ENDO SUITE;  Service: Endoscopy;  Laterality: N/A;  9:45 am   DILATION AND CURETTAGE OF UTERUS     EMBOLECTOMY  01/29/2011   Procedure: EMBOLECTOMY;  Surgeon: Pryor Ochoa, MD;  Location: [facility] OR;  Service: Vascular;  Laterality: Left;  Left Popliteal and Tibial Embolectomy with patch angioplasty   ESOPHAGOGASTRODUODENOSCOPY (EGD) WITH PROPOFOL N/A 07/17/2021   Procedure: ESOPHAGOGASTRODUODENOSCOPY (EGD) WITH PROPOFOL;  Surgeon: Lanelle Bal, DO;  Location: [facility] ENDO [facility];  Service: Endoscopy;  Laterality: N/A;  1:00pm   INCISION AND DRAINAGE ABSCESS N/A 04/06/2016   Procedure: INCISION AND  DRAINAGE ABSCESS;  Surgeon: Tilda Burrow, MD;  Location: [facility] ORS;  Service: Gynecology;  Laterality: N/A;   LEFT HEART CATHETERIZATION WITH CORONARY ANGIOGRAM N/A 10/01/2012   Procedure: LEFT HEART CATHETERIZATION WITH CORONARY ANGIOGRAM;  Surgeon: Peter M Swaziland, MD;  Location: [facility] CATH LAB;  Service: Cardiovascular;  Laterality: N/A;   LOWER EXTREMITY ANGIOGRAM N/A 01/27/2011   Procedure: LOWER EXTREMITY ANGIOGRAM;  Surgeon: Larina Earthly, MD;  Location: [facility] CATH LAB;  Service: Cardiovascular;  Laterality: N/A;   LOWER EXTREMITY ANGIOGRAM N/A 01/28/2011   Procedure: LOWER EXTREMITY ANGIOGRAM;  Surgeon: Fransisco Hertz, MD;  Location: [facility] CATH LAB;  Service: Cardiovascular;  Laterality: N/A;   LOWER EXTREMITY ANGIOGRAM Left 01/29/2011   Procedure:  LOWER EXTREMITY ANGIOGRAM;  Surgeon: Pryor Ochoa, MD;  Location: [facility] CATH LAB;  Service: Cardiovascular;  Laterality: Left;   MULTIPLE TOOTH EXTRACTIONS     POLYPECTOMY  12/14/2021   Procedure: POLYPECTOMY;  Surgeon: Lanelle Bal, DO;  [facility];  Service: Endoscopy;;   TEE WITHOUT CARDIOVERSION  04/15/2011   Procedure: TRANSESOPHAGEAL ECHOCARDIOGRAM (TEE);  Surgeon: Kathlen Brunswick, MD;  Location: [facility];  Service: Cardiovascular;  Laterality: N/A;   WRIST SURGERY     Left    Family History  Problem Relation Age of Onset   COPD Mother    Hypertension Mother    Diabetes Mother    Stroke Mother    Coronary artery disease Mother    Diabetes Father    Hypertension Father    Coronary artery disease Father    Kidney disease Father        on dialysis, died from heart attack while being dialzed   Pancreatitis Maternal Aunt    Cancer Maternal Aunt        unknown primary   Anesthesia problems Neg Hx    Colon cancer Neg Hx     Social History   Socioeconomic History   Marital status: Single    Spouse name: Not on file   Number of children: Not on file   Years of education: Not on file   Highest education level: Not on  file  Occupational History   Not on file  Tobacco Use   Smoking status: Every Day    Current packs/day: 0.50    Average packs/day: 0.5 packs/day for 33.2 years (16.6 ttl pk-yrs)    Types: Cigarettes    Start date: 07/06/1989   Smokeless tobacco: Never  Vaping Use   Vaping status: Never Used  Substance and Sexual Activity   Alcohol use: No    Alcohol/week: 0.0 standard drinks of alcohol    Comment: rarely   Drug use: Not Currently    Frequency: 1.0 times per week    Types: Marijuana   Sexual activity: Yes    Partners: Male    Comment: pt stated that her tubal was only 50% effective- "not cut and burned"  Other Topics Concern   Not on file  Social History Narrative   Unemployed.  Lives in [place] with [place], [place], and mother.  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   Social Determinants of Health   Financial Resource Strain: Not on file  Food Insecurity: Not on file  Transportation Needs: Not on file  Physical Activity: Not on file  Stress: Not on file  Social Connections: Not on file  Intimate Partner Violence: Not on file    Outpatient Medications Prior to Visit  Medication Sig Dispense Refill   acetaminophen (TYLENOL) 500 MG tablet Take 500 mg by mouth every 6 (six) hours as needed for moderate pain.     atorvastatin (LIPITOR) 80 MG tablet TAKE ONE TABLET (80MG  TOTAL) BY MOUTH DAILY 90 tablet 3   calcium carbonate (TUMS EX) 750 MG chewable tablet Chew 1 tablet by mouth daily as needed for heartburn.     carvedilol (COREG) 6.25 MG tablet TAKE ONE TABLET (6.25MG  TOTAL) BY MOUTH TWO TIMES DAILY WITH A MEAL 180 tablet 3   cetirizine (ZYRTEC) 10 MG tablet Take 10 mg by mouth daily as needed for allergies.     Continuous Blood Gluc Sensor (DEXCOM G7 SENSOR) MISC Inject 1 Application into the skin as directed. Change sensor every  10 days as directed. 9 each 3   ELIQUIS 2.5 MG TABS tablet TAKE ONE TABLET (2.5MG  TOTAL) BY MOUTH TWO TIMES DAILY 180 tablet 1   glucose  blood (ACCU-CHEK GUIDE) test strip Use as instructed to monitor glucose 4 times daily, before meals and before bed. 400 each 12   insulin aspart (NOVOLOG) 100 UNIT/ML FlexPen Inject 1-7 Units into the skin 3 (three) times daily with meals. 15 mL 3   Insulin Pen Needle (UNIFINE PENTIPS) 31G X 8 MM MISC USE TO INJECT INSULIN SUBCUTANEOUSLY . USE FOR BOTH SLIDING SCALE AND FOR DAILYINSULIN DOSES. 100 each 11   linaclotide (LINZESS) 290 MCG CAPS capsule Take 1 capsule (290 mcg total) by mouth daily before breakfast. 90 capsule 3   Misc. Devices MISC Wheelchair cushion - small. 1 each 0   omeprazole (PRILOSEC) 20 MG capsule Take 1 capsule (20 mg total) by mouth 2 (two) times daily before a meal. 60 capsule 3   ondansetron (ZOFRAN) 4 MG tablet TAKE ONE TABLET BY MOUTH (4MG ) EVERY 4 TO 6 HOURS AS NEEDED FOR NAUSEA OR VOMITING 90 tablet 1   oxyCODONE-acetaminophen (PERCOCET) 10-325 MG tablet Take 0.5 tablets every 4 (four) hours as needed by mouth for pain. (Patient taking differently: Take 1 tablet by mouth every 4 (four) hours as needed for pain.) 120 tablet 0   RITUXAN 500 MG/50ML injection Inject 100 mLs (1,000 mg total) into the vein every 14 (fourteen) days. Courier to [facility]. [address] [place] [address] Attn: Serafina Royals. (pharmacy dept) 200 mL 0   simethicone (GAS-X) 80 MG chewable tablet Chew 80 mg by mouth every 6 (six) hours as needed for flatulence.     TRESIBA FLEXTOUCH 100 UNIT/ML FlexTouch Pen Inject 17 Units into the skin at bedtime. 15 mL 3   hydrALAZINE (APRESOLINE) 25 MG tablet Take 1 tablet (25 mg total) by mouth in the morning and at bedtime. 180 tablet 3   mirtazapine (REMERON) 7.5 MG tablet Take 1 tablet (7.5 mg total) by mouth at bedtime. 30 tablet 5   No facility-administered medications prior to visit.    Allergies  Allergen Reactions   Losartan Swelling   Nsaids     Kidney disease    ROS Review of Systems  Constitutional:  Negative for chills and fever.   HENT:  Negative for congestion, sinus pressure, sinus pain and sore throat.   Eyes:  Positive for visual disturbance. Negative for pain and discharge.  Respiratory:  Negative for cough and shortness of breath.   Cardiovascular:  Negative for chest pain and palpitations.  Gastrointestinal:  Negative for abdominal pain, nausea and vomiting.  Endocrine: Negative for polydipsia and polyuria.  Genitourinary:  Negative for dysuria and hematuria.  Musculoskeletal:  Positive for arthralgias and back pain. Negative for neck pain and neck stiffness.  Skin:  Negative for rash.  Neurological:  Negative for dizziness and weakness.  Psychiatric/Behavioral:  Negative for agitation and behavioral problems. The patient is nervous/anxious.       Objective:    Physical Exam Vitals reviewed.  Constitutional:      General: She is not in acute distress.    Appearance: She is cachectic. She is not diaphoretic.     Comments: In wheelchair  HENT:     Head: Normocephalic and atraumatic.     Nose: Nose normal.     Mouth/Throat:     Mouth: Mucous membranes are moist.  Eyes:     General: No scleral icterus.  Extraocular Movements: Extraocular movements intact.  Cardiovascular:     Rate and Rhythm: Normal rate and regular rhythm.     Pulses: Normal pulses.     Heart sounds: Normal heart sounds. No murmur heard. Pulmonary:     Breath sounds: Normal breath sounds. No wheezing or rales.  Genitourinary:    Comments: GU exam deferred due to patient preference -patient reported mass in the vaginal area, likely Bartholin cyst Musculoskeletal:     Cervical back: Neck supple. No tenderness.     Comments: S/p left BKA, prosthesis in place  Skin:    General: Skin is warm.  Neurological:     General: No focal deficit present.     Mental Status: She is alert and oriented to person, place, and time.     Sensory: Sensory deficit present.     Motor: Weakness (Right LE) present.  Psychiatric:        Mood and  Affect: Mood normal.        Behavior: Behavior normal.     BP (!) 158/62 (BP Location: Left Arm)   Pulse 89   Ht 5\' 1"  (1.549 m)   Wt 106 lb 6.4 oz (48.3 kg)   LMP 12/28/2010   SpO2 97%   BMI 20.10 kg/m  Wt Readings from Last 3 Encounters:  09/06/22 106 lb 6.4 oz (48.3 kg)  08/23/22 104 lb 6.4 oz (47.4 kg)  05/05/22 101 lb (45.8 kg)    Lab Results  Component Value Date   TSH 3.070 04/09/2021   Lab Results  Component Value Date   WBC 8.5 02/11/2022   HGB 10.9 (A) 08/09/2022   HCT 32 (A) 08/09/2022   MCV 93 02/11/2022   PLT 261 02/11/2022   Lab Results  Component Value Date   NA 135 02/18/2022   K 5.5 (H) 02/18/2022   CO2 18 (L) 02/18/2022   GLUCOSE 415 (H) 02/18/2022   BUN 58 (A) 08/09/2022   CREATININE 3.1 (A) 08/09/2022   BILITOT 0.3 02/11/2022   ALKPHOS 110 02/11/2022   AST 17 02/11/2022   ALT 14 02/11/2022   PROT 6.5 02/11/2022   ALBUMIN 4.1 02/11/2022   CALCIUM 8.8 02/18/2022   ANIONGAP 4 (L) 12/08/2021   EGFR 18 08/09/2022   Lab Results  Component Value Date   CHOL 151 04/09/2021   Lab Results  Component Value Date   HDL 59 04/09/2021   Lab Results  Component Value Date   LDLCALC 77 04/09/2021   Lab Results  Component Value Date   TRIG 75 04/09/2021   Lab Results  Component Value Date   CHOLHDL 2.6 04/09/2021   Lab Results  Component Value Date   HGBA1C 8.4 08/09/2022      Assessment & Plan:   Problem List Items Addressed This Visit       Cardiovascular and Mediastinum   PAD (peripheral artery disease) (HCC)    Related to type 1 DM S/p left AKA On Eliquis due to coagulopathy      Relevant Medications   hydrALAZINE (APRESOLINE) 10 MG tablet   Cardiomyopathy (HCC) - Primary    Ischemic On Coreg 6.25 mg BID Follows up with Cardiologist      Relevant Medications   hydrALAZINE (APRESOLINE) 10 MG tablet   Essential hypertension    BP Readings from Last 1 Encounters:  09/06/22 (!) 158/62   Uncontrolled with Coreg 6.25  mg twice daily Added hydralazine at lower dose-10 mg BID Counseled for compliance with the medications Advised  DASH diet      Relevant Medications   hydrALAZINE (APRESOLINE) 10 MG tablet     Endocrine   Diabetes mellitus type I (HCC)    Lab Results  Component Value Date   HGBA1C 8.4 08/09/2022   Uncontrolled, but improving On [place] 12 U qHS and ISS, followed by Endocrinology Advised to follow diabetic diet - needs to eat at regular intervals and take mealtime insulin On statin Diabetic eye exam: In 10/2019, no retinopathy        Genitourinary   Stage 4 chronic kidney disease (HCC)    Related to uncontrolled type 2 DM Progressive CKD Followed by Nephrology      Bartholin gland cyst    Vaginal area mass likely Bartholin cyst Started empiric Bactrim Referred to OB/GYN - has had I&D in the past      Relevant Medications   sulfamethoxazole-trimethoprim (BACTRIM) 400-80 MG tablet   Other Relevant Orders   Ambulatory referral to Obstetrics / Gynecology     Other   GAD (generalized anxiety disorder) (Chronic)    Considering her weight loss due to lack of appetite, continue Remeron at a lower dose now - did not tolerate 15 mg dose Her chronic medical conditions are also contributing to her depression and anxiety Did not tolerate Elavil      Relevant Medications   mirtazapine (REMERON) 7.5 MG tablet   Moderate protein-calorie malnutrition (HCC)    BMI Readings from Last 3 Encounters:  09/06/22 20.10 kg/m  08/23/22 19.73 kg/m  05/05/22 19.08 kg/m   Due to poor p.o. intake Needs to eat at regular intervals and take mealtime insulin Needs to continue Remeron Needs to take protein supplement daily      Relevant Medications   mirtazapine (REMERON) 7.5 MG tablet     Meds ordered this encounter  Medications   hydrALAZINE (APRESOLINE) 10 MG tablet    Sig: Take 1 tablet (10 mg total) by mouth 2 (two) times daily.    Dispense:  60 tablet    Refill:  3    sulfamethoxazole-trimethoprim (BACTRIM) 400-80 MG tablet    Sig: Take 1 tablet by mouth 2 (two) times daily.    Dispense:  10 tablet    Refill:  0   mirtazapine (REMERON) 7.5 MG tablet    Sig: Take 1 tablet (7.5 mg total) by mouth at bedtime.    Dispense:  30 tablet    Refill:  5    Follow-up: Return in about 4 months (around 01/06/2023) for Annual physical.    Anabel Halon, MD

## 2022-09-09 ENCOUNTER — Other Ambulatory Visit: Payer: Self-pay

## 2022-09-09 DIAGNOSIS — N184 Chronic kidney disease, stage 4 (severe): Secondary | ICD-10-CM

## 2022-09-10 DIAGNOSIS — N750 Cyst of Bartholin's gland: Secondary | ICD-10-CM | POA: Insufficient documentation

## 2022-09-10 NOTE — Assessment & Plan Note (Signed)
BMI Readings from Last 3 Encounters:  09/06/22 20.10 kg/m  08/23/22 19.73 kg/m  05/05/22 19.08 kg/m   Due to poor p.o. intake Needs to eat at regular intervals and take mealtime insulin Needs to continue Remeron Needs to take protein supplement daily

## 2022-09-10 NOTE — Assessment & Plan Note (Signed)
Vaginal area mass likely Bartholin cyst Started empiric Bactrim Referred to OB/GYN - has had I&D in the past

## 2022-09-10 NOTE — Assessment & Plan Note (Signed)
Considering her weight loss due to lack of appetite, continue Remeron at a lower dose now - did not tolerate 15 mg dose Her chronic medical conditions are also contributing to her depression and anxiety Did not tolerate Elavil

## 2022-09-17 ENCOUNTER — Ambulatory Visit (INDEPENDENT_AMBULATORY_CARE_PROVIDER_SITE_OTHER): Payer: Medicaid Other | Admitting: Vascular Surgery

## 2022-09-17 ENCOUNTER — Ambulatory Visit (INDEPENDENT_AMBULATORY_CARE_PROVIDER_SITE_OTHER)
Admission: RE | Admit: 2022-09-17 | Discharge: 2022-09-17 | Disposition: A | Discharge status: Home / Self Care | Payer: Medicaid Other | Source: Ambulatory Visit | Attending: Vascular Surgery | Admitting: Vascular Surgery

## 2022-09-17 ENCOUNTER — Encounter: Payer: Self-pay | Admitting: Vascular Surgery

## 2022-09-17 ENCOUNTER — Ambulatory Visit (HOSPITAL_COMMUNITY)
Admission: RE | Admit: 2022-09-17 | Discharge: 2022-09-17 | Disposition: A | Discharge status: Home / Self Care | Payer: Medicaid Other | Source: Ambulatory Visit | Attending: Vascular Surgery | Admitting: Vascular Surgery

## 2022-09-17 VITALS — BP 134/68 | HR 79 | Temp 98.0°F | Resp 20 | Ht 61.0 in | Wt 106.0 lb

## 2022-09-17 DIAGNOSIS — N184 Chronic kidney disease, stage 4 (severe): Secondary | ICD-10-CM | POA: Diagnosis not present

## 2022-09-17 NOTE — Progress Notes (Signed)
Office Note     CC:  [id number] Requesting Provider:  Randa Lynn, MD  HPI: [name] is a Right handed 52 y.o. (1970/08/25) female with kidney disease who presents at the request of Randa Lynn, MD for permanent HD access. The patient has had no prior access procedures. Currently not on dialysis.  On exam, [name] was doing well, accompanied by her daughter.  She has had a difficult life, stating she has been sick from an early age.  Unable to work, on disability her entire life.  Previous history of stroke leaving her with right-sided deficits.  Right arm is contracted, unable to use.  Has history of left below-knee amputation.  [name] spends the majority of her day sitting at home.  She loves scrolling TikTok.    Past Medical History:  Diagnosis Date   Acquired absence of left leg below knee (HCC)    Anxiety    was on xanax for years but discontinued prior to prior PCP retiring as she was on opioids and was told she would not find someone to write for both   Arterial thromboembolism (HCC)    Prior embolectomy, previously on Coumadin   Cataract    Phreesia 11/19/2019   Chronic kidney disease    Phreesia 11/19/2019   Clotting disorder (HCC)    Phreesia 11/19/2019   Coronary atherosclerosis of native coronary artery    a. s/p DES to LAD in 2014   Depression    Depression    Phreesia 11/19/2019   Diabetes mellitus without complication (HCC)    Phreesia 11/19/2019   Diabetic ketoacidosis with coma associated with type 1 diabetes mellitus (HCC)    DKA, type 1 (HCC) 11/17/2016   Facial cellulitis    12/2010   Glomerulonephritis    Headache(784.0)    Hemiplegia, unspecified affecting right dominant side (HCC)    History of stroke    Hyperlipidemia    Insulin dependent diabetes mellitus    History of diabetic ketoacidosis   Ischemic cardiomyopathy    a. EF 30-35% in 2018 b. EF at 45-50% by repeat imaging in 02/2019   Left carotid artery occlusion    Major  depressive disorder, single episode, unspecified    N&V (nausea and vomiting) 06/03/2021   Pancreatitis, acute 11/07/2016   Peripheral arterial disease (HCC)    Shingles    11/2010   ST elevation myocardial infarction (STEMI) of anterior wall [facility])    Late presentation September 2014   Stroke [facility])    2018   Upper abdominal pain 06/03/2021    Past Surgical History:  Procedure Laterality Date   AMPUTATION  03/03/2011   Procedure: AMPUTATION DIGIT;  Surgeon: Nadara Mustard, MD;  Location: [facility] OR;  Service: Orthopedics;  Laterality: Left;  Left Foot Amputation 4th and 5th toes at MTP joint   AMPUTATION  04/22/2011   Procedure: AMPUTATION BELOW KNEE;  Surgeon: Nadara Mustard, MD;  Location: [facility] OR;  Service: Orthopedics;  Laterality: Left;  Left Below Knee Amputation   BIOPSY  07/17/2021   Procedure: BIOPSY;  Surgeon: Lanelle Bal, DO;  Location: [facility] ENDO SUITE;  Service: Endoscopy;;   CATARACT EXTRACTION W/PHACO Left 01/09/2020   Procedure: CATARACT EXTRACTION PHACO AND INTRAOCULAR LENS PLACEMENT (IOC);  Surgeon: Fabio Pierce, MD;  Location: [facility] ORS;  Service: Ophthalmology;  Laterality: Left;  CDE: 9.73   COLONOSCOPY WITH PROPOFOL N/A 12/14/2021   Procedure: COLONOSCOPY WITH PROPOFOL;  Surgeon: Lanelle Bal, DO;  Location: [facility] ENDO  SUITE;  Service: Endoscopy;  Laterality: N/A;  9:45 am   DILATION AND CURETTAGE OF UTERUS     EMBOLECTOMY  01/29/2011   Procedure: EMBOLECTOMY;  Surgeon: Pryor Ochoa, MD;  Location: [facility] OR;  Service: Vascular;  Laterality: Left;  Left Popliteal and Tibial Embolectomy with patch angioplasty   ESOPHAGOGASTRODUODENOSCOPY (EGD) WITH PROPOFOL N/A 07/17/2021   Procedure: ESOPHAGOGASTRODUODENOSCOPY (EGD) WITH PROPOFOL;  Surgeon: Lanelle Bal, DO;  [facility] ENDO SU[facility];  Service: Endoscopy;  Laterality: N/A;  1:00pm   INCISION AND DRAINAGE ABSCESS N/A 04/06/2016   Procedure: INCISION AND DRAINAGE ABSCESS;  Surgeon: Tilda Burrow, MD;  Location: [facility] ORS;   Service: Gynecology;  Laterality: N/A;   LEFT HEART CATHETERIZATION WITH CORONARY ANGIOGRAM N/A 10/01/2012   Procedure: LEFT HEART CATHETERIZATION WITH CORONARY ANGIOGRAM;  Surgeon: Peter M Swaziland, MD;  Location: [facility] CATH LAB;  Service: Cardiovascular;  Laterality: N/A;   LOWER EXTREMITY ANGIOGRAM N/A 01/27/2011   Procedure: LOWER EXTREMITY ANGIOGRAM;  Surgeon: Larina Earthly, MD;  Location: [facility] CATH LAB;  Service: Cardiovascular;  Laterality: N/A;   LOWER EXTREMITY ANGIOGRAM N/A 01/28/2011   Procedure: LOWER EXTREMITY ANGIOGRAM;  Surgeon: Fransisco Hertz, MD;  Location: [facility] CATH LAB;  Service: Cardiovascular;  Laterality: N/A;   LOWER EXTREMITY ANGIOGRAM Left 01/29/2011   Procedure: LOWER EXTREMITY ANGIOGRAM;  Surgeon: Pryor Ochoa, MD;  Location: [facility] CATH LAB;  Service: Cardiovascular;  Laterality: Left;   MULTIPLE TOOTH EXTRACTIONS     POLYPECTOMY  12/14/2021   Procedure: POLYPECTOMY;  Surgeon: Lanelle Bal, DO;  [facility];  Service: Endoscopy;;   TEE WITHOUT CARDIOVERSION  04/15/2011   Procedure: TRANSESOPHAGEAL ECHOCARDIOGRAM (TEE);  Surgeon: Kathlen Brunswick, MD;  [facility];  Service: Cardiovascular;  Laterality: N/A;   WRIST SURGERY     Left    Social History   Socioeconomic History   Marital status: Single    Spouse name: Not on file   Number of children: Not on file   Years of education: Not on file   Highest education level: Not on file  Occupational History   Not on file  Tobacco Use   Smoking status: Every Day    Current packs/day: 0.50    Average packs/day: 0.5 packs/day for 33.2 years (16.6 ttl pk-yrs)    Types: Cigarettes    Start date: 07/06/1989   Smokeless tobacco: Never  Vaping Use   Vaping status: Never Used  Substance and Sexual Activity   Alcohol use: No    Alcohol/week: 0.0 standard drinks of alcohol    Comment: rarely   Drug use: Not Currently    Frequency: 1.0 times per week    Types: Marijuana   Sexual activity: Yes     Partners: Male    Comment: pt stated that her tubal was only 50% effective- "not cut and burned"  Other Topics Concern   Not on file  Social History Narrative   Unemployed.  Lives in [place] with [place], [place], and mother.  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   Social Determinants of Health   Financial Resource Strain: Not on file  Food Insecurity: Not on file  Transportation Needs: Not on file  Physical Activity: Not on file  Stress: Not on file  Social Connections: Not on file  Intimate Partner Violence: Not on file   Family History  Problem Relation Age of Onset   COPD Mother    Hypertension Mother    Diabetes Mother  Stroke Mother    Coronary artery disease Mother    Diabetes Father    Hypertension Father    Coronary artery disease Father    Kidney disease Father        on dialysis, died from heart attack while being dialzed   Pancreatitis Maternal Aunt    Cancer Maternal Aunt        unknown primary   Anesthesia problems Neg Hx    Colon cancer Neg Hx     Current Outpatient Medications  Medication Sig Dispense Refill   acetaminophen (TYLENOL) 500 MG tablet Take 500 mg by mouth every 6 (six) hours as needed for moderate pain.     atorvastatin (LIPITOR) 80 MG tablet TAKE ONE TABLET (80MG  TOTAL) BY MOUTH DAILY 90 tablet 3   calcium carbonate (TUMS EX) 750 MG chewable tablet Chew 1 tablet by mouth daily as needed for heartburn.     carvedilol (COREG) 6.25 MG tablet TAKE ONE TABLET (6.25MG  TOTAL) BY MOUTH TWO TIMES DAILY WITH A MEAL 180 tablet 3   cetirizine (ZYRTEC) 10 MG tablet Take 10 mg by mouth daily as needed for allergies.     Continuous Blood Gluc Sensor (DEXCOM G7 SENSOR) MISC Inject 1 Application into the skin as directed. Change sensor every 10 days as directed. 9 each 3   ELIQUIS 2.5 MG TABS tablet TAKE ONE TABLET (2.5MG  TOTAL) BY MOUTH TWO TIMES DAILY 180 tablet 1   glucose blood (ACCU-CHEK GUIDE) test strip Use as instructed to monitor glucose 4  times daily, before meals and before bed. 400 each 12   hydrALAZINE (APRESOLINE) 10 MG tablet Take 1 tablet (10 mg total) by mouth 2 (two) times daily. 60 tablet 3   insulin aspart (NOVOLOG) 100 UNIT/ML FlexPen Inject 1-7 Units into the skin 3 (three) times daily with meals. 15 mL 3   Insulin Pen Needle (UNIFINE PENTIPS) 31G X 8 MM MISC USE TO INJECT INSULIN SUBCUTANEOUSLY . USE FOR BOTH SLIDING SCALE AND FOR DAILYINSULIN DOSES. 100 each 11   linaclotide (LINZESS) 290 MCG CAPS capsule Take 1 capsule (290 mcg total) by mouth daily before breakfast. 90 capsule 3   mirtazapine (REMERON) 7.5 MG tablet Take 1 tablet (7.5 mg total) by mouth at bedtime. 30 tablet 5   Misc. Devices MISC Wheelchair cushion - small. 1 each 0   omeprazole (PRILOSEC) 20 MG capsule Take 1 capsule (20 mg total) by mouth 2 (two) times daily before a meal. 60 capsule 3   ondansetron (ZOFRAN) 4 MG tablet TAKE ONE TABLET BY MOUTH (4MG ) EVERY 4 TO 6 HOURS AS NEEDED FOR NAUSEA OR VOMITING 90 tablet 1   oxyCODONE-acetaminophen (PERCOCET) 10-325 MG tablet Take 0.5 tablets every 4 (four) hours as needed by mouth for pain. (Patient taking differently: Take 1 tablet by mouth every 4 (four) hours as needed for pain.) 120 tablet 0   RITUXAN 500 MG/50ML injection Inject 100 mLs (1,000 mg total) into the vein every 14 (fourteen) days. Courier to [facility]. [address] [place] [address] Attn: Serafina Royals. (pharmacy dept) 200 mL 0   simethicone (GAS-X) 80 MG chewable tablet Chew 80 mg by mouth every 6 (six) hours as needed for flatulence.     sulfamethoxazole-trimethoprim (BACTRIM) 400-80 MG tablet Take 1 tablet by mouth 2 (two) times daily. 10 tablet 0   TRESIBA FLEXTOUCH 100 UNIT/ML FlexTouch Pen Inject 17 Units into the skin at bedtime. 15 mL 3   No current facility-administered medications for this visit.  Allergies  Allergen Reactions   Losartan Swelling   Nsaids     Kidney disease     REVIEW OF SYSTEMS:  [X]  denotes  positive finding, [ ]  denotes negative finding Cardiac  Comments:  Chest pain or chest pressure:    Shortness of breath upon exertion:    Short of breath when lying flat:    Irregular heart rhythm:        Vascular    Pain in calf, thigh, or hip brought on by ambulation:    Pain in feet at night that wakes you up from your sleep:     Blood clot in your veins:    Leg swelling:         Pulmonary    Oxygen at home:    Productive cough:     Wheezing:         Neurologic    Sudden weakness in arms or legs:     Sudden numbness in arms or legs:     Sudden onset of difficulty speaking or slurred speech:    Temporary loss of vision in one eye:     Problems with dizziness:         Gastrointestinal    Blood in stool:     Vomited blood:         Genitourinary    Burning when urinating:     Blood in urine:        Psychiatric    Major depression:         Hematologic    Bleeding problems:    Problems with blood clotting too easily:        Skin    Rashes or ulcers:        Constitutional    Fever or chills:      PHYSICAL EXAMINATION:  Vitals:   09/17/22 1454  BP: 134/68  Pulse: 79  Resp: 20  Temp: 98 F (36.7 C)  SpO2: 96%  Weight: 106 lb (48.1 kg)  Height: 5\' 1"  (1.549 m)    General:  WDWN in NAD; vital signs documented above Gait: Not observed HENT: WNL, normocephalic Pulmonary: normal non-labored breathing , without Rales, rhonchi,  wheezing Cardiac: regular HR Abdomen: soft, NT, no masses Skin: without rashes Vascular Exam/Pulses:  Right Left  Radial 2+ (normal) 2+ (normal)                       Extremities: without ischemic changes, without Gangrene , without cellulitis; without open wounds;  Musculoskeletal: no muscle wasting or atrophy  Neurologic: A&O X 3;  No focal weakness or paresthesias are detected Psychiatric:  The pt has Normal affect.   Non-Invasive Vascular Imaging:    Small superficial veins  bilaterally      ASSESSMENT/PLAN:  [name] is a 52 y.o. female who presents with chronic kidney disease stage 4  Based on vein mapping and examination, patient is only a candidate for LEFT arm AV graft creation. She was very emotional today and we had a long discussion regarding the above.  With a GFR of 4, I do not think that this is urgent.  I plan to ensure that Dr. Wolfgang Phoenix has a copy of this, and I am happy to place an arteriovenous graft at his request. I would like to place the graft on the right side, as the arm is nonfunctional, however the arm is contracted with full range of motion. I had an extensive discussion with this  patient in regards to the nature of access surgery, including risk, benefits, and alternatives.   The patient is aware that the risks of access surgery include but are not limited to: bleeding, infection, steal syndrome, nerve damage, ischemic monomelic neuropathy, failure of access to mature, complications related to venous hypertension, and possible need for additional access procedures in the future. I am happy to perform left arm AV graft creation closer to the time of dialysis, at nephrology's request.  Victorino Sparrow, MD [facility] [phone]

## 2022-10-08 ENCOUNTER — Other Ambulatory Visit: Payer: Self-pay | Admitting: Gastroenterology

## 2022-10-08 DIAGNOSIS — R109 Unspecified abdominal pain: Secondary | ICD-10-CM

## 2022-10-09 ENCOUNTER — Encounter (HOSPITAL_COMMUNITY): Payer: Self-pay

## 2022-10-11 ENCOUNTER — Ambulatory Visit: Payer: Medicaid Other

## 2022-10-20 ENCOUNTER — Encounter: Payer: Medicaid Other | Admitting: Obstetrics & Gynecology

## 2022-11-11 ENCOUNTER — Ambulatory Visit: Payer: Medicaid Other | Admitting: Obstetrics & Gynecology

## 2022-11-11 ENCOUNTER — Other Ambulatory Visit: Payer: Self-pay | Admitting: Student

## 2022-11-11 ENCOUNTER — Encounter: Payer: Self-pay | Admitting: Obstetrics & Gynecology

## 2022-11-11 ENCOUNTER — Other Ambulatory Visit: Payer: Self-pay | Admitting: Cardiology

## 2022-11-11 VITALS — BP 130/67 | HR 84 | Ht 61.0 in | Wt 110.0 lb

## 2022-11-11 DIAGNOSIS — N764 Abscess of vulva: Secondary | ICD-10-CM | POA: Diagnosis not present

## 2022-11-11 NOTE — Progress Notes (Signed)
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 G37P0101 PM female being seen today for concern of Bartholin's cyst..     She is a prior patient of Dr. Emelda Fear and notes remote history of Bartholin's cyst that required surgical intervention.  When this cyst occurred patient was seen by PCP started antibiotics and was using warm compresses.  Luckily it has since resolved.    Denies boil currently denies drainage pain or discharge.  Denies fevers or chills.  Reports no acute GYN concerns  Patient's last menstrual period was 12/28/2010.    Review of Systems:   Pertinent items are noted in HPI  Pertinent History Reviewed:   Past Surgical History:  Procedure Laterality Date   AMPUTATION  03/03/2011   Procedure: AMPUTATION DIGIT;  Surgeon: Nadara Mustard, MD;  Location: [facility] OR;  Service: Orthopedics;  Laterality: Left;  Left Foot Amputation 4th and 5th toes at MTP joint   AMPUTATION  04/22/2011   Procedure: AMPUTATION BELOW KNEE;  Surgeon: Nadara Mustard, MD;  Location: [facility] OR;  Service: Orthopedics;  Laterality: Left;  Left Below Knee Amputation   BIOPSY  07/17/2021   Procedure: BIOPSY;  Surgeon: Lanelle Bal, DO;  [facility];  Service: Endoscopy;;   CATARACT EXTRACTION W/PHACO Left 01/09/2020   Procedure: CATARACT EXTRACTION PHACO AND INTRAOCULAR LENS PLACEMENT (IOC);  Surgeon: Fabio Pierce, MD;  Location: [facility] ORS;  Service: Ophthalmology;  Laterality: Left;  CDE: 9.73   COLONOSCOPY WITH PROPOFOL N/A 12/14/2021   Procedure: COLONOSCOPY WITH PROPOFOL;  Surgeon: Lanelle Bal, DO;  [facility];  Service: Endoscopy;  Laterality: N/A;  9:45 am   DILATION AND CURETTAGE OF UTERUS     EMBOLECTOMY  01/29/2011   Procedure: EMBOLECTOMY;  Surgeon: Pryor Ochoa, MD;  Location: [facility] OR;  Service: Vascular;   Laterality: Left;  Left Popliteal and Tibial Embolectomy with patch angioplasty   ESOPHAGOGASTRODUODENOSCOPY (EGD) WITH PROPOFOL N/A 07/17/2021   Procedure: ESOPHAGOGASTRODUODENOSCOPY (EGD) WITH PROPOFOL;  Surgeon: Lanelle Bal, DO;  [facility];  Service: Endoscopy;  Laterality: N/A;  1:00pm   INCISION AND DRAINAGE ABSCESS N/A 04/06/2016   Procedure: INCISION AND DRAINAGE ABSCESS;  Surgeon: Tilda Burrow, MD;  Location: [facility] ORS;  Service: Gynecology;  Laterality: N/A;   LEFT HEART CATHETERIZATION WITH CORONARY ANGIOGRAM N/A 10/01/2012   Procedure: LEFT HEART CATHETERIZATION WITH CORONARY ANGIOGRAM;  Surgeon: Peter M Swaziland, MD;  Location: [facility] CATH LAB;  Service: Cardiovascular;  Laterality: N/A;   LOWER EXTREMITY ANGIOGRAM N/A 01/27/2011   Procedure: LOWER EXTREMITY ANGIOGRAM;  Surgeon: Larina Earthly, MD;  Location: [facility] CATH LAB;  Service: Cardiovascular;  Laterality: N/A;   LOWER EXTREMITY ANGIOGRAM N/A 01/28/2011   Procedure: LOWER EXTREMITY ANGIOGRAM;  Surgeon: Fransisco Hertz, MD;  Location: [facility] CATH LAB;  Service: Cardiovascular;  Laterality: N/A;   LOWER EXTREMITY ANGIOGRAM Left 01/29/2011   Procedure: LOWER EXTREMITY ANGIOGRAM;  Surgeon: Pryor Ochoa, MD;  Location: [facility] CATH LAB;  Service: Cardiovascular;  Laterality: Left;   MULTIPLE TOOTH EXTRACTIONS     POLYPECTOMY  12/14/2021   Procedure: POLYPECTOMY;  Surgeon: Lanelle Bal, DO;  [facility];  Service: Endoscopy;;   TEE WITHOUT CARDIOVERSION  04/15/2011   Procedure: TRANSESOPHAGEAL ECHOCARDIOGRAM (TEE);  Surgeon: Kathlen Brunswick, MD;  [facility]  [facility];  Service: Cardiovascular;  Laterality: N/A;   WRIST SURGERY     Left    Past Medical History:  Diagnosis Date   Acquired absence of left leg below knee (HCC)    Anxiety    was on xanax for years but discontinued prior to prior PCP retiring as she was on opioids and was told she would not find someone to write for both   Arterial thromboembolism (HCC)     Prior embolectomy, previously on Coumadin   Cataract    Phreesia 11/19/2019   Chronic kidney disease    Phreesia 11/19/2019   Clotting disorder (HCC)    Phreesia 11/19/2019   Coronary atherosclerosis of native coronary artery    a. s/p DES to LAD in 2014   Depression    Depression    Phreesia 11/19/2019   Diabetes mellitus without complication (HCC)    Phreesia 11/19/2019   Diabetic ketoacidosis with coma associated with type 1 diabetes mellitus (HCC)    DKA, type 1 (HCC) 11/17/2016   Facial cellulitis    12/2010   Glomerulonephritis    Headache(784.0)    Hemiplegia, unspecified affecting right dominant side (HCC)    History of stroke    Hyperlipidemia    Insulin dependent diabetes mellitus    History of diabetic ketoacidosis   Ischemic cardiomyopathy    a. EF 30-35% in 2018 b. EF at 45-50% by repeat imaging in 02/2019   Left carotid artery occlusion    Major depressive disorder, single episode, unspecified    N&V (nausea and vomiting) 06/03/2021   Pancreatitis, acute 11/07/2016   Peripheral arterial disease (HCC)    Shingles    11/2010   ST elevation myocardial infarction (STEMI) of anterior wall [facility])    Late presentation September 2014   Stroke [facility])    2018   Upper abdominal pain 06/03/2021   Reviewed problem list, medications and allergies. Physical Assessment:   Vitals:   11/11/22 1333  BP: 130/67  Pulse: 84  Weight: 110 lb (49.9 kg)  Height: 5\' 1"  (1.549 m)  Body mass index is 20.78 kg/m.       Physical Examination:   General appearance: alert, well appearing, and in no distress  Psych: mood appropriate, normal affect  Skin: warm & dry   Cardiovascular: normal heart rate noted  Respiratory: normal respiratory effort, no distress  Abdomen: soft, non-tender   Pelvic: VULVA: normal appearing vulva with no masses, tenderness or lesions.  No cyst or scarring appreciated  Extremities: left BKA  Chaperone:  Patient family member present      Assessment & Plan:  1) Vulvar abscess -now resolved -reviewed conservative management -f/u prn    Return if symptoms worsen or fail to improve.   Myna Hidalgo, DO Attending Obstetrician & Gynecologist, [facility] for [employer], [facility]

## 2022-11-23 ENCOUNTER — Ambulatory Visit (INDEPENDENT_AMBULATORY_CARE_PROVIDER_SITE_OTHER): Payer: Medicaid Other | Admitting: Nurse Practitioner

## 2022-11-23 ENCOUNTER — Encounter: Payer: Self-pay | Admitting: Nurse Practitioner

## 2022-11-23 VITALS — BP 136/72 | HR 76 | Ht 61.0 in | Wt 108.0 lb

## 2022-11-23 DIAGNOSIS — E1059 Type 1 diabetes mellitus with other circulatory complications: Secondary | ICD-10-CM | POA: Diagnosis not present

## 2022-11-23 DIAGNOSIS — I10 Essential (primary) hypertension: Secondary | ICD-10-CM

## 2022-11-23 DIAGNOSIS — Z794 Long term (current) use of insulin: Secondary | ICD-10-CM

## 2022-11-23 DIAGNOSIS — E782 Mixed hyperlipidemia: Secondary | ICD-10-CM

## 2022-11-23 DIAGNOSIS — E559 Vitamin D deficiency, unspecified: Secondary | ICD-10-CM

## 2022-11-23 LAB — POCT GLYCOSYLATED HEMOGLOBIN (HGB A1C): Hemoglobin A1C: 9.1 % — AB (ref 4.0–5.6)

## 2022-11-23 NOTE — Progress Notes (Signed)
Endocrinology Follow Up Note       11/23/2022, 4:30 PM   Subjective:    Patient ID: [name], female    DOB: 15-Nov-1970.  CRISTEL RAIL is being seen in follow up after being seen in consultation for management of currently uncontrolled symptomatic diabetes requested by  Anabel Halon, MD.   Past Medical History:  Diagnosis Date   Acquired absence of left leg below knee (HCC)    Anxiety    was on xanax for years but discontinued prior to prior PCP retiring as she was on opioids and was told she would not find someone to write for both   Arterial thromboembolism (HCC)    Prior embolectomy, previously on Coumadin   Cataract    Phreesia 11/19/2019   Chronic kidney disease    Phreesia 11/19/2019   Clotting disorder (HCC)    Phreesia 11/19/2019   Coronary atherosclerosis of native coronary artery    a. s/p DES to LAD in 2014   Depression    Depression    Phreesia 11/19/2019   Diabetes mellitus without complication (HCC)    Phreesia 11/19/2019   Diabetic ketoacidosis with coma associated with type 1 diabetes mellitus (HCC)    DKA, type 1 (HCC) 11/17/2016   Facial cellulitis    12/2010   Glomerulonephritis    Headache(784.0)    Hemiplegia, unspecified affecting right dominant side (HCC)    History of stroke    Hyperlipidemia    Insulin dependent diabetes mellitus    History of diabetic ketoacidosis   Ischemic cardiomyopathy    a. EF 30-35% in 2018 b. EF at 45-50% by repeat imaging in 02/2019   Left carotid artery occlusion    Major depressive disorder, single episode, unspecified    N&V (nausea and vomiting) 06/03/2021   Pancreatitis, acute 11/07/2016   Peripheral arterial disease (HCC)    Shingles    11/2010   ST elevation myocardial infarction (STEMI) of anterior wall [facility])    Late presentation September 2014   Stroke [facility])    2018   Upper abdominal pain 06/03/2021    Past Surgical History:   Procedure Laterality Date   AMPUTATION  03/03/2011   Procedure: AMPUTATION DIGIT;  Surgeon: Nadara Mustard, MD;  Location: [facility] OR;  Service: Orthopedics;  Laterality: Left;  Left Foot Amputation 4th and 5th toes at MTP joint   AMPUTATION  04/22/2011   Procedure: AMPUTATION BELOW KNEE;  Surgeon: Nadara Mustard, MD;  Location: [facility] OR;  Service: Orthopedics;  Laterality: Left;  Left Below Knee Amputation   BIOPSY  07/17/2021   Procedure: BIOPSY;  Surgeon: Lanelle Bal, DO;  Location: [facility] ENDO SUITE;  Service: Endoscopy;;   CATARACT EXTRACTION W/PHACO Left 01/09/2020   Procedure: CATARACT EXTRACTION PHACO AND INTRAOCULAR LENS PLACEMENT (IOC);  Surgeon: Fabio Pierce, MD;  Location: [facility] ORS;  Service: Ophthalmology;  Laterality: Left;  CDE: 9.73   COLONOSCOPY WITH PROPOFOL N/A 12/14/2021   Procedure: COLONOSCOPY WITH PROPOFOL;  Surgeon: Lanelle Bal, DO;  Location: [facility] ENDO SUITE;  Service: Endoscopy;  Laterality: N/A;  9:45 am   DILATION AND CURETTAGE OF UTERUS     EMBOLECTOMY  01/29/2011   Procedure: EMBOLECTOMY;  Surgeon: Pryor Ochoa, MD;  Location: [facility] OR;  Service: Vascular;  Laterality: Left;  Left Popliteal and Tibial Embolectomy with patch angioplasty   ESOPHAGOGASTRODUODENOSCOPY (EGD) WITH PROPOFOL N/A 07/17/2021   Procedure: ESOPHAGOGASTRODUODENOSCOPY (EGD) WITH PROPOFOL;  Surgeon: Lanelle Bal, DO;  Location[facility];  Service: Endoscopy;  Laterality: N/A;  1:00pm   INCISION AND DRAINAGE ABSCESS N/A 04/06/2016   Procedure: INCISION AND DRAINAGE ABSCESS;  Surgeon: Tilda Burrow, MD;  Location: [facility] ORS;  Service: Gynecology;  Laterality: N/A;   LEFT HEART CATHETERIZATION WITH CORONARY ANGIOGRAM N/A 10/01/2012   Procedure: LEFT HEART CATHETERIZATION WITH CORONARY ANGIOGRAM;  Surgeon: Peter M Swaziland, MD;  Location: [facility] CATH LAB;  Service: Cardiovascular;  Laterality: N/A;   LOWER EXTREMITY ANGIOGRAM N/A 01/27/2011   Procedure: LOWER EXTREMITY ANGIOGRAM;  Surgeon: Larina Earthly, MD;   Location: [facility] CATH LAB;  Service: Cardiovascular;  Laterality: N/A;   LOWER EXTREMITY ANGIOGRAM N/A 01/28/2011   Procedure: LOWER EXTREMITY ANGIOGRAM;  Surgeon: Fransisco Hertz, MD;  Location: [facility] CATH LAB;  Service: Cardiovascular;  Laterality: N/A;   LOWER EXTREMITY ANGIOGRAM Left 01/29/2011   Procedure: LOWER EXTREMITY ANGIOGRAM;  Surgeon: Pryor Ochoa, MD;  Location: [facility] CATH LAB;  Service: Cardiovascular;  Laterality: Left;   MULTIPLE TOOTH EXTRACTIONS     POLYPECTOMY  12/14/2021   Procedure: POLYPECTOMY;  Surgeon: Lanelle Bal, DO;  [facility];  Service: Endoscopy;;   TEE WITHOUT CARDIOVERSION  04/15/2011   Procedure: TRANSESOPHAGEAL ECHOCARDIOGRAM (TEE);  Surgeon: Kathlen Brunswick, MD;  Location[facility];  Service: Cardiovascular;  Laterality: N/A;   WRIST SURGERY     Left    Social History   Socioeconomic History   Marital status: Single    Spouse name: Not on file   Number of children: 1   Years of education: Not on file   Highest education level: Not on file  Occupational History   Not on file  Tobacco Use   Smoking status: Every Day    Current packs/day: 0.50    Average packs/day: 0.5 packs/day for 33.4 years (16.7 ttl pk-yrs)    Types: Cigarettes    Start date: 07/06/1989   Smokeless tobacco: Never  Vaping Use   Vaping status: Never Used  Substance and Sexual Activity   Alcohol use: No    Alcohol/week: 0.0 standard drinks of alcohol    Comment: rarely   Drug use: Not Currently    Frequency: 1.0 times per week    Types: Marijuana   Sexual activity: Not Currently    Partners: Male    Comment: pt stated that her tubal was only 50% effective- "not cut and burned"  Other Topics Concern   Not on file  Social History Narrative   Unemployed.  Lives in [place] with [place], [place], and mother.  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   Social Determinants of Health   Financial Resource Strain: Low Risk  (11/11/2022)   Overall Financial  Resource Strain (CARDIA)    Difficulty of Paying Living Expenses: Not hard at all  Food Insecurity: No Food Insecurity (11/11/2022)   Hunger Vital Sign    Worried About Running Out of Food in the Last Year: Never true    Ran Out of Food in the Last Year: Never true  Transportation Needs: No Transportation Needs (11/11/2022)   PRAPARE - Administrator, Civil Service (Medical): No    Lack of Transportation (Non-Medical): No  Physical  Activity: Inactive (11/11/2022)   Exercise Vital Sign    Days of Exercise per Week: 0 days    Minutes of Exercise per Session: 0 min  Stress: Stress Concern Present (11/11/2022)   [employer] of Occupational Health - Occupational Stress Questionnaire    Feeling of Stress : Very much  Social Connections: Socially Isolated (11/11/2022)   Social Connection and Isolation Panel [NHANES]    Frequency of Communication with Friends and Family: Once a week    Frequency of Social Gatherings with Friends and Family: Once a week    Attends Religious Services: Never    Database administrator or Organizations: No    Attends Engineer, structural: Never    Marital Status: Never married    Family History  Problem Relation Age of Onset   COPD Mother    Hypertension Mother    Diabetes Mother    Stroke Mother    Coronary artery disease Mother    Diabetes Father    Hypertension Father    Coronary artery disease Father    Kidney disease Father        on dialysis, died from heart attack while being dialzed   Pancreatitis Maternal Aunt    Cancer Maternal Aunt        unknown primary   Anesthesia problems Neg Hx    Colon cancer Neg Hx     Outpatient Encounter Medications as of 11/23/2022  Medication Sig   acetaminophen (TYLENOL) 500 MG tablet Take 500 mg by mouth every 6 (six) hours as needed for moderate pain.   atorvastatin (LIPITOR) 80 MG tablet TAKE ONE TABLET (80MG  TOTAL) BY MOUTH DAILY   calcium carbonate (TUMS EX) 750 MG chewable  tablet Chew 1 tablet by mouth daily as needed for heartburn.   carvedilol (COREG) 6.25 MG tablet TAKE ONE TABLET (6.25MG  TOTAL) BY MOUTH TWO TIMES DAILY WITH A MEAL   cetirizine (ZYRTEC) 10 MG tablet Take 10 mg by mouth daily as needed for allergies.   Continuous Blood Gluc Sensor (DEXCOM G7 SENSOR) MISC Inject 1 Application into the skin as directed. Change sensor every 10 days as directed.   ELIQUIS 2.5 MG TABS tablet TAKE ONE TABLET (2.5MG  TOTAL) BY MOUTH TWO TIMES DAILY   glucose blood (ACCU-CHEK GUIDE) test strip Use as instructed to monitor glucose 4 times daily, before meals and before bed.   hydrALAZINE (APRESOLINE) 10 MG tablet Take 1 tablet (10 mg total) by mouth 2 (two) times daily.   insulin aspart (NOVOLOG) 100 UNIT/ML FlexPen Inject 1-7 Units into the skin 3 (three) times daily with meals.   Insulin Pen Needle (UNIFINE PENTIPS) 31G X 8 MM MISC USE TO INJECT INSULIN SUBCUTANEOUSLY . USE FOR BOTH SLIDING SCALE AND FOR DAILYINSULIN DOSES.   linaclotide (LINZESS) 290 MCG CAPS capsule Take 1 capsule (290 mcg total) by mouth daily before breakfast.   mirtazapine (REMERON) 7.5 MG tablet Take 1 tablet (7.5 mg total) by mouth at bedtime.   Misc. Devices MISC Wheelchair cushion - small.   nitroGLYCERIN (NITROSTAT) 0.4 MG SL tablet PLACE ONE TABLET (0.4MG  TOTAL) UNDER THETONGUE EVERY FIVE MINUTES AS NEEDED FOR CHEST PAIN (Patient not taking: Reported on 11/11/2022)   omeprazole (PRILOSEC) 20 MG capsule TAKE ONE (1) CAPSULE BY MOUTH TWICE A DAY. (EVERY 12 HOURS.) BEFORE A MEAL   ondansetron (ZOFRAN) 4 MG tablet TAKE ONE TABLET BY MOUTH (4MG ) EVERY 4 TO 6 HOURS AS NEEDED FOR NAUSEA OR VOMITING   oxyCODONE-acetaminophen (PERCOCET) 10-325 MG  tablet Take 0.5 tablets every 4 (four) hours as needed by mouth for pain. (Patient taking differently: Take 1 tablet by mouth every 4 (four) hours as needed for pain.)   RITUXAN 500 MG/50ML injection Inject 100 mLs (1,000 mg total) into the vein every 14  (fourteen) days. C[place] to [facility]. [address] [place] [address] Attn: Serafina Royals. (pharmacy dept) (Patient not taking: Reported on 11/11/2022)   simethicone (GAS-X) 80 MG chewable tablet Chew 80 mg by mouth every 6 (six) hours as needed for flatulence.   TRESIBA FLEXTOUCH 100 UNIT/ML FlexTouch Pen Inject 17 Units into the skin at bedtime. (Patient taking differently: Inject 12 Units into the skin at bedtime.)   No facility-administered encounter medications on file as of 11/23/2022.    ALLERGIES: Allergies  Allergen Reactions   Losartan Swelling   Nsaids     Kidney disease    VACCINATION STATUS: Immunization History  Administered Date(s) Administered   Influenza, Seasonal, Injecte, Preservative Fre 11/23/2018   Influenza,inj,Quad PF,6+ Mos 10/24/2014, 11/28/2019, 11/13/2020, 12/31/2021   Influenza-Unspecified 11/08/2011, 01/15/2013, 11/20/2015   Pneumococcal Polysaccharide-23 11/08/2016    Diabetes She presents for her follow-up diabetic visit. She has type 1 diabetes mellitus. Onset time: She was diagnosed at approximate age of 36. Her disease course has been fluctuating. There are no hypoglycemic associated symptoms. Associated symptoms include blurred vision, fatigue and foot paresthesias. Pertinent negatives for diabetes include no polydipsia, no polyphagia, no polyuria and no weight loss. There are no hypoglycemic complications. (Has has several hypoglycemic events in the past where EMS was called.  She is extremely scared of becoming hypoglycemic again.) Symptoms are stable. Diabetic complications include a CVA, heart disease, nephropathy, peripheral neuropathy and PVD. Risk factors for coronary artery disease include diabetes mellitus, dyslipidemia, hypertension, sedentary lifestyle and tobacco exposure. Current diabetic treatment includes intensive insulin program. She is compliant with treatment most of the time. Her weight is fluctuating minimally. She is following a  generally unhealthy diet. When asked about meal planning, she reported none. She has not had a previous visit with a dietitian. She never (Has L AKA with prosthesis) participates in exercise. Her home blood glucose trend is fluctuating dramatically. Her overall blood glucose range is >200 mg/dl. (She presents today, accompanied by her daughter, with her glucose readings showing fluctuating glycemic profile but improved overall.  Her POCT A1c today is 9.1% increasing from last visit of 8.4%.  Analysis of her CGM shows TIR 40%, TAR 59%, TBR 1% with a GMI of 8.1%.  She admits she did cheat on her diet during her vacation recently.  She also notes she is being prepped for HD for worsening CKD.) An ACE inhibitor/angiotensin II receptor blocker is not being taken. She sees a podiatrist.Eye exam is current.  Hyperlipidemia This is a chronic problem. The current episode started more than 1 year ago. The problem is uncontrolled. Recent lipid tests were reviewed and are variable. Exacerbating diseases include chronic renal disease and diabetes. Factors aggravating her hyperlipidemia include beta blockers, smoking and fatty foods. Current antihyperlipidemic treatment includes statins. Compliance problems include adherence to diet and adherence to exercise.  Risk factors for coronary artery disease include diabetes mellitus, dyslipidemia, hypertension and a sedentary lifestyle.  Hypertension This is a chronic problem. The current episode started more than 1 year ago. The problem has been waxing and waning since onset. The problem is uncontrolled. Associated symptoms include blurred vision. There are no associated agents to hypertension. Risk factors for coronary artery disease  include diabetes mellitus, dyslipidemia, smoking/tobacco exposure and sedentary lifestyle. Past treatments include beta blockers. The current treatment provides mild improvement. Compliance problems include diet and exercise.  Hypertensive end-organ  damage includes kidney disease, CAD/MI, CVA, heart failure and PVD. Identifiable causes of hypertension include chronic renal disease.    Review of systems  Constitutional: +minimally fluctuating body weight,  current Body mass index is 20.41 kg/m. 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), no xerophthalmia ENT: no sore throat, no nodules palpated in throat, no dysphagia/odynophagia, no hoarseness Cardiovascular: no chest pain, no shortness of breath, no palpitations, no leg swelling Respiratory: no cough, no shortness of breath Gastrointestinal: no nausea/vomiting/diarrhea Genitourinary: +polyuria, + polyphagia Musculoskeletal: no muscle/joint aches, Hx of L AKA- with prosthesis Skin: no rashes, no hyperemia Neurological: no tremors, no numbness, no tingling, no dizziness Psychiatric: no depression, no anxiety   Objective:    BP 136/72   Pulse 76   Ht 5\' 1"  (1.549 m)   Wt 108 lb (49 kg)   LMP 12/28/2010   BMI 20.41 kg/m   Wt Readings from Last 3 Encounters:  11/23/22 108 lb (49 kg)  11/11/22 110 lb (49.9 kg)  09/17/22 106 lb (48.1 kg)    BP Readings from Last 3 Encounters:  11/23/22 136/72  11/11/22 130/67  09/17/22 134/68    Physical Exam- Limited  Constitutional:  Body mass index is 20.41 kg/m. , not in acute distress, mildly anxious state of mind, tearful during visit, irritable Eyes:  EOMI, no exophthalmos Musculoskeletal: L AKA with new prosthesis, strength intact in all four extremities, no gross restriction of joint movements Skin:  no rashes, no hyperemia, + nicotinic discoloration to fingernails Neurological: no tremor with outstretched hands    CMP ( most recent) CMP     Component Value Date/Time   NA 135 02/18/2022 1451   K 5.5 (H) 02/18/2022 1451   CL 101 02/18/2022 1451   CO2 18 (L) 02/18/2022 1451   GLUCOSE 415 (H) 02/18/2022 1451   GLUCOSE 172  (H) 12/08/2021 1015   BUN 58 (A) 08/09/2022 0000   CREATININE 3.1 (A) 08/09/2022 0000   CREATININE 2.58 (H) 02/18/2022 1451   CALCIUM 8.8 02/18/2022 1451   PROT 6.5 02/11/2022 1230   ALBUMIN 4.1 02/11/2022 1230   AST 17 02/11/2022 1230   ALT 14 02/11/2022 1230   ALKPHOS 110 02/11/2022 1230   BILITOT 0.3 02/11/2022 1230   GFRNONAA 21 (L) 12/08/2021 1015   GFRAA 28 (L) 09/28/2019 1456     Diabetic Labs (most recent): Lab Results  Component Value Date   HGBA1C 9.1 (A) 11/23/2022   HGBA1C 8.4 08/09/2022   HGBA1C 10.3 (A) 03/29/2022     Lipid Panel ( most recent) Lipid Panel     Component Value Date/Time   CHOL 151 04/09/2021 1227   TRIG 75 04/09/2021 1227   HDL 59 04/09/2021 1227   CHOLHDL 2.6 04/09/2021 1227   CHOLHDL 4.2 11/07/2016 0722   VLDL 30 11/07/2016 0722   LDLCALC 77 04/09/2021 1227   LABVLDL 15 04/09/2021 1227       Assessment & Plan:   1) Type 1 diabetes mellitus with other circulatory complication (HCC)  She presents today, accompanied by her daughter, with her glucose readings showing fluctuating glycemic profile but improved overall.  Her POCT A1c today is 9.1% increasing from last visit of 8.4%.  Analysis of her CGM shows TIR 40%, TAR  59%, TBR 1% with a GMI of 8.1%.  She admits she did cheat on her diet during her vacation recently.  She also notes she is being prepped for HD for worsening CKD.  - MACYN SHROPSHIRE has currently uncontrolled symptomatic type 2 DM since 52 years of age.  Recent labs reviewed.  - I had a long discussion with her about the progressive nature of diabetes and the pathology behind its complications. -her diabetes is complicated by CAD, PVD, CKD, CVA, MI, DKA and she remains at a high risk for more acute and chronic complications which include retinopathy, and neuropathy. These are all discussed in detail with her.  - Nutritional counseling repeated at each appointment due to patients tendency to fall back in to old habits.  -  The patient admits there is a room for improvement in their diet and drink choices. -  Suggestion is made for the patient to avoid simple carbohydrates from their diet including Cakes, Sweet Desserts / Pastries, Ice Cream, Soda (diet and regular), Sweet Tea, Candies, Chips, Cookies, Sweet Pastries, Store Bought Juices, Alcohol in Excess of 1-2 drinks a day, Artificial Sweeteners, Coffee Creamer, and "Sugar-free" Products. This will help patient to have stable blood glucose profile and potentially avoid unintended weight gain.   - I encouraged the patient to switch to unprocessed or minimally processed complex starch and increased protein intake (animal or plant source), fruits, and vegetables.   - Patient is advised to stick to a routine mealtimes to eat 3 meals a day and avoid unnecessary snacks (to snack only to correct hypoglycemia).  - she will be scheduled with Norm Salt, RDN, CDE for diabetes education.  - I have approached her with the following individualized plan to manage  her diabetes and patient agrees:    -She is advised to continue her Tresiba 12 units SQ nightly and Novolog 1-7 units TID with meals if glucose is above 90 and she is eating (Specific instructions on how to titrate insulin dosage based on glucose readings given to patient in writing).  She is limited by her lack of meal routine.     -She is encouraged to continue monitoring blood glucose 4 times per day using her CGM (which she was recently approved for), before meals and before bed, and notify the clinic if she has readings less than 70 or greater than 300 for 3 tests in a row.  She is benefiting from CGM device, is advised to continue using.  - she is warned not to take insulin without proper monitoring per orders.  - Adjustment parameters are given to her for hypo and hyperglycemia in writing.  - Specific targets for  A1c;  LDL, HDL,  and Triglycerides were discussed with the patient.  2) Blood Pressure  /Hypertension:  Her blood pressure is controlled to target.  She is advised to continue Carvedilol 6.25 mg po twice daily and Hydralazine 12.5 mg po twice daily.  Will defer any med changes to nephrology.  3) Lipids/Hyperlipidemia:  Her most recent lipid panel from 04/09/21 shows controlled LDL of 77.5.  She is advised to continue Lipitor 80 mg po daily at bedtime.  Side effects and precautions discussed with her.  She is also advised to avoid fried foods and butter.  4)  Weight/Diet: Her Body mass index is 20.41 kg/m.  -  she is not a candidate for weight loss.  Exercise, and detailed carbohydrates information provided  -  detailed on discharge instructions.  5) Vitamin  D deficiency Her most recent vitamin D level was 16.2 on 04/09/21.  She is not currently on supplementation.    6) Chronic Care/Health Maintenance: -she is on Statin medications and is encouraged to initiate and continue to follow up with Ophthalmology, Dentist,  Podiatrist at least yearly or according to recommendations, and advised to QUIT SMOKING. I have recommended yearly flu vaccine and pneumonia vaccine at least every 5 years; moderate intensity exercise for up to 150 minutes weekly; and  sleep for at least 7 hours a day.  Smoking cessation instruction/counseling given:  counseled patient on the dangers of tobacco use, advised patient to stop smoking, and reviewed strategies to maximize success   - she is advised to maintain close follow up with Anabel Halon, MD for primary care needs, as well as her other providers for optimal and coordinated care. -filled out forms for her to be able to get diabetic shoes.     I spent  42  minutes in the care of the patient today including review of labs from CMP, Lipids, Thyroid Function, Hematology (current and previous including abstractions from other facilities); face-to-face time discussing  her blood glucose readings/logs, discussing hypoglycemia and hyperglycemia episodes  and symptoms, medications doses, her options of short and long term treatment based on the latest standards of care / guidelines;  discussion about incorporating lifestyle medicine;  and documenting the encounter. Risk reduction counseling performed per USPSTF guidelines to reduce obesity and cardiovascular risk factors.     Please refer to Patient Instructions for Blood Glucose Monitoring and Insulin/Medications Dosing Guide"  in media tab for additional information. Please  also refer to " Patient Self Inventory" in the Media  tab for reviewed elements of pertinent patient history.  Mervyn Skeeters participated in the discussions, expressed understanding, and voiced agreement with the above plans.  All questions were answered to her satisfaction. she is encouraged to contact clinic should she have any questions or concerns prior to her return visit.   Follow up plan: - Return in about 4 months (around 03/23/2023) for Diabetes F/U with A1c in office, No previsit labs, Bring meter and logs.   Ronny Bacon, [facility] [address] [place], [place] [address] Phone: [phone] Fax: [phone]  11/23/2022, 4:30 PM

## 2022-12-10 ENCOUNTER — Ambulatory Visit
Admission: EM | Admit: 2022-12-10 | Discharge: 2022-12-10 | Disposition: A | Discharge status: Home / Self Care | Payer: Medicaid Other | Attending: Nurse Practitioner | Admitting: Nurse Practitioner

## 2022-12-10 DIAGNOSIS — H5712 Ocular pain, left eye: Secondary | ICD-10-CM

## 2022-12-10 DIAGNOSIS — Z9842 Cataract extraction status, left eye: Secondary | ICD-10-CM

## 2022-12-10 DIAGNOSIS — H5789 Other specified disorders of eye and adnexa: Secondary | ICD-10-CM

## 2022-12-10 MED ORDER — POLYMYXIN B-TRIMETHOPRIM 10000-0.1 UNIT/ML-% OP SOLN: 1.0000 [drp] | Freq: Four times a day (QID) | OPHTHALMIC | 0 refills | Status: AC

## 2022-12-10 NOTE — ED Triage Notes (Signed)
Pt reports her left eye is draining, painful, and the outside is red. x 5 days.   Pt is visual impaired due to cataract surgery in her left eye

## 2022-12-10 NOTE — ED Notes (Signed)
Pt could not complete the visual acuity screening. Became upset and kept saying that she couldn't see. Patient cannot read letters anymore due to a past stroke. I switched to the letter chart for adolescents and she still could not read the symbols from the appropriate distance.    Provider notified.

## 2022-12-10 NOTE — ED Provider Notes (Signed)
[facility]    CSN: [id number] Arrival date & time: 12/10/22  1758      History   Chief Complaint Chief Complaint  Patient presents with   Eye Pain    HPI [name] is a 52 y.o. female.   The history is provided by the patient and a relative (Daughter).   Patient brought in by her daughter for complaints of left eye pain.  Symptoms have been present for the past 5 days.  Daughter reports the left eye has been draining clear fluid, and is painful.  Patient has been rubbing the eye consistently since her symptoms started.  Pain with movement of the eye such as looking up or looking down.  Patient denies feeling of a foreign object in the eye.  Daughter denies fever, chills, purulent drainage, eye swelling, or any further visual changes.  Daughter reports patient does have a history of cataracts in the left eye, cataract surgery was 2 to 3 years ago.  Past Medical History:  Diagnosis Date   Acquired absence of left leg below knee (HCC)    Anxiety    was on xanax for years but discontinued prior to prior PCP retiring as she was on opioids and was told she would not find someone to write for both   Arterial thromboembolism (HCC)    Prior embolectomy, previously on Coumadin   Cataract    Phreesia 11/19/2019   Chronic kidney disease    Phreesia 11/19/2019   Clotting disorder (HCC)    Phreesia 11/19/2019   Coronary atherosclerosis of native coronary artery    a. s/p DES to LAD in 2014   Depression    Depression    Phreesia 11/19/2019   Diabetes mellitus without complication (HCC)    Phreesia 11/19/2019   Diabetic ketoacidosis with coma associated with type 1 diabetes mellitus (HCC)    DKA, type 1 (HCC) 11/17/2016   Facial cellulitis    12/2010   Glomerulonephritis    Headache(784.0)    Hemiplegia, unspecified affecting right dominant side (HCC)    History of stroke    Hyperlipidemia    Insulin dependent diabetes mellitus    History of diabetic  ketoacidosis   Ischemic cardiomyopathy    a. EF 30-35% in 2018 b. EF at 45-50% by repeat imaging in 02/2019   Left carotid artery occlusion    Major depressive disorder, single episode, unspecified    N&V (nausea and vomiting) 06/03/2021   Pancreatitis, acute 11/07/2016   Peripheral arterial disease (HCC)    Shingles    11/2010   ST elevation myocardial infarction (STEMI) of anterior wall [facility])    Late presentation September 2014   Stroke [facility])    2018   Upper abdominal pain 06/03/2021    Patient Active Problem List   Diagnosis Date Noted   Bartholin gland cyst 09/10/2022   Moderate protein-calorie malnutrition (HCC) 05/07/2022   Hx of BKA, left (HCC) 12/31/2021   Encounter for general adult medical examination with abnormal findings 12/31/2021   Membranous glomerulonephritis 12/31/2021   GERD (gastroesophageal reflux disease) 11/09/2021   Right sided abdominal pain 11/09/2021   Positive colorectal cancer screening using Cologuard test 11/09/2021   Cervical cancer screening 09/21/2021   GAD (generalized anxiety disorder) 08/03/2021   Essential hypertension 08/03/2021   N&V (nausea and vomiting) 06/03/2021   Constipation 06/03/2021   Dysphagia 06/03/2021   Cystitis 05/01/2021   Acute hepatitis 05/01/2021   Stage 4 chronic kidney disease (HCC) 11/28/2019  Diabetes mellitus type I (HCC) 11/28/2019   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)    Hemiplegia, unspecified affecting right dominant side (HCC)    Cardiomyopathy (HCC)    Palliative care by specialist    Thrombophilia (HCC)    ICAO (internal carotid artery occlusion), left    Cerebral embolism with cerebral infarction 11/19/2016   PAD (peripheral artery disease) (HCC) 11/12/2014   Coronary artery disease involving native heart without angina pectoris 02/20/2013   Cardiomyopathy, ischemic 02/20/2013   History of stroke 04/13/2011   Thromboembolism of lower extremity artery (HCC) 02/09/2011   Hyperlipidemia     Tobacco abuse    Dyslipidemia 10/27/2010   Proteinuria 10/27/2010   Ascites 10/26/2010   Depression 10/26/2010    Past Surgical History:  Procedure Laterality Date   AMPUTATION  03/03/2011   Procedure: AMPUTATION DIGIT;  Surgeon: Nadara Mustard, MD;  Location: [facility] OR;  Service: Orthopedics;  Laterality: Left;  Left Foot Amputation 4th and 5th toes at MTP joint   AMPUTATION  04/22/2011   Procedure: AMPUTATION BELOW KNEE;  Surgeon: Nadara Mustard, MD;  Location: [facility] OR;  Service: Orthopedics;  Laterality: Left;  Left Below Knee Amputation   BIOPSY  07/17/2021   Procedure: BIOPSY;  Surgeon: Lanelle Bal, DO;  Location: [facility];  Service: Endoscopy;;   CATARACT EXTRACTION W/PHACO Left 01/09/2020   Procedure: CATARACT EXTRACTION PHACO AND INTRAOCULAR LENS PLACEMENT (IOC);  Surgeon: Fabio Pierce, MD;  Location: [facility] ORS;  Service: Ophthalmology;  Laterality: Left;  CDE: 9.73   COLONOSCOPY WITH PROPOFOL N/A 12/14/2021   Procedure: COLONOSCOPY WITH PROPOFOL;  Surgeon: Lanelle Bal, DO;  Location: [facility];  Service: Endoscopy;  Laterality: N/A;  9:45 am   DILATION AND CURETTAGE OF UTERUS     EMBOLECTOMY  01/29/2011   Procedure: EMBOLECTOMY;  Surgeon: Pryor Ochoa, MD;  Location: [facility] OR;  Service: Vascular;  Laterality: Left;  Left Popliteal and Tibial Embolectomy with patch angioplasty   ESOPHAGOGASTRODUODENOSCOPY (EGD) WITH PROPOFOL N/A 07/17/2021   Procedure: ESOPHAGOGASTRODUODENOSCOPY (EGD) WITH PROPOFOL;  Surgeon: Lanelle Bal, DO;  Location[facility];  Service: Endoscopy;  Laterality: N/A;  1:00pm   INCISION AND DRAINAGE ABSCESS N/A 04/06/2016   Procedure: INCISION AND DRAINAGE ABSCESS;  Surgeon: Tilda Burrow, MD;  Location: [facility]S;  Service: Gynecology;  Laterality: N/A;   LEFT HEART CATHETERIZATION WITH CORONARY ANGIOGRAM N/A 10/01/2012   Procedure: LEFT HEART CATHETERIZATION WITH CORONARY ANGIOGRAM;  Surgeon: Peter M Swaziland, MD;  Location: [facility] CATH LAB;  Service:  Cardiovascular;  Laterality: N/A;   LOWER EXTREMITY ANGIOGRAM N/A 01/27/2011   Procedure: LOWER EXTREMITY ANGIOGRAM;  Surgeon: Larina Earthly, MD;  Location: [facility] CATH LAB;  Service: Cardiovascular;  Laterality: N/A;   LOWER EXTREMITY ANGIOGRAM N/A 01/28/2011   Procedure: LOWER EXTREMITY ANGIOGRAM;  Surgeon: Fransisco Hertz, MD;  Location: [facility] CATH LAB;  Service: Cardiovascular;  Laterality: N/A;   LOWER EXTREMITY ANGIOGRAM Left 01/29/2011   Procedure: LOWER EXTREMITY ANGIOGRAM;  Surgeon: Pryor Ochoa, MD;  Location: [facility] CATH LAB;  Service: Cardiovascular;  Laterality: Left;   MULTIPLE TOOTH EXTRACTIONS     POLYPECTOMY  12/14/2021   Procedure: POLYPECTOMY;  Surgeon: Lanelle Bal, DO;  [facility];  Service: Endoscopy;;   TEE WITHOUT CARDIOVERSION  04/15/2011   Procedure: TRANSESOPHAGEAL ECHOCARDIOGRAM (TEE);  Surgeon: Kathlen Brunswick, MD;  Location[facility];  Service: Cardiovascular;  Laterality: N/A;   WRIST SURGERY  Left    OB History     Gravida  1   Para  1   Term      Preterm  1   AB      Living  1      SAB      IAB      Ectopic      Multiple      Live Births               Home Medications    Prior to Admission medications   Medication Sig Start Date End Date Taking? Authorizing Provider  trimethoprim-polymyxin b (POLYTRIM) ophthalmic solution Place 1 drop into the left eye every 6 (six) hours for 7 days. 12/10/22 12/17/22 Yes Leath-Warren, Sadie Haber, NP  acetaminophen (TYLENOL) 500 MG tablet Take 500 mg by mouth every 6 (six) hours as needed for moderate pain.    [provider]  atorvastatin (LIPITOR) 80 MG tablet TAKE ONE TABLET (80MG  TOTAL) BY MOUTH DAILY 01/07/22   Jonelle Sidle, MD  calcium carbonate (TUMS EX) 750 MG chewable tablet Chew 1 tablet by mouth daily as needed for heartburn.    [provider]  carvedilol (COREG) 6.25 MG tablet TAKE ONE TABLET (6.25MG  TOTAL) BY MOUTH TWO TIMES DAILY WITH A MEAL  01/07/22   Jonelle Sidle, MD  cetirizine (ZYRTEC) 10 MG tablet Take 10 mg by mouth daily as needed for allergies.    [provider]  Continuous Blood Gluc Sensor (DEXCOM G7 SENSOR) MISC Inject 1 Application into the skin as directed. Change sensor every 10 days as directed. 03/29/22   Dani Gobble, NP  ELIQUIS 2.5 MG TABS tablet TAKE ONE TABLET (2.5MG  TOTAL) BY MOUTH TWO TIMES DAILY 07/19/22   Jonelle Sidle, MD  glucose blood (ACCU-CHEK GUIDE) test strip Use as instructed to monitor glucose 4 times daily, before meals and before bed. 12/02/21   Dani Gobble, NP  hydrALAZINE (APRESOLINE) 10 MG tablet Take 1 tablet (10 mg total) by mouth 2 (two) times daily. 09/06/22   Anabel Halon, MD  insulin aspart (NOVOLOG) 100 UNIT/ML FlexPen Inject 1-7 Units into the skin 3 (three) times daily with meals. 08/23/22   Dani Gobble, NP  Insulin Pen Needle (UNIFINE PENTIPS) 31G X 8 MM MISC USE TO INJECT INSULIN SUBCUTANEOUSLY . USE FOR BOTH SLIDING SCALE AND FOR DAILYINSULIN DOSES. 08/23/22   Dani Gobble, NP  linaclotide [facility]) 290 MCG CAPS capsule Take 1 capsule (290 mcg total) by mouth daily before breakfast. 02/11/22   Gelene Mink, NP  mirtazapine (REMERON) 7.5 MG tablet Take 1 tablet (7.5 mg total) by mouth at bedtime. 09/06/22   Anabel Halon, MD  Misc. Devices MISC Wheelchair cushion - small. 12/31/21   Anabel Halon, MD  nitroGLYCERIN (NITROSTAT) 0.4 MG SL tablet PLACE ONE TABLET (0.4MG  TOTAL) UNDER THETONGUE EVERY FIVE MINUTES AS NEEDED FOR CHEST PAIN Patient not taking: Reported on 11/11/2022 11/11/22   Jonelle Sidle, MD  omeprazole (PRILOSEC) 20 MG capsule TAKE ONE (1) CAPSULE BY MOUTH TWICE A DAY. (EVERY 12 HOURS.) BEFORE A MEAL 10/12/22   Gelene Mink, NP  ondansetron (ZOFRAN) 4 MG tablet TAKE ONE TABLET BY MOUTH (4MG ) EVERY 4 TO 6 HOURS AS NEEDED FOR NAUSEA OR VOMITING 05/27/22   Gelene Mink, NP  oxyCODONE-acetaminophen (PERCOCET) 10-325 MG tablet Take  0.5 tablets every 4 (four) hours as needed by mouth for pain. Patient taking differently: Take 1  tablet by mouth every 4 (four) hours as needed for pain. 11/24/16   Regalado, Belkys A, MD  RITUXAN 500 MG/50ML injection Inject 100 mLs (1,000 mg total) into the vein every 14 (fourteen) days. Courier to [facility]. [address] [place] [address] Attn: Serafina Royals. (pharmacy dept) Patient not taking: Reported on 11/11/2022 03/11/22   Randa Lynn, MD  simethicone (GAS-X) 80 MG chewable tablet Chew 80 mg by mouth every 6 (six) hours as needed for flatulence.    [provider]  TRESIBA FLEXTOUCH 100 UNIT/ML FlexTouch Pen Inject 17 Units into the skin at bedtime. Patient taking differently: Inject 12 Units into the skin at bedtime. 08/23/22   Dani Gobble, NP    Family History Family History  Problem Relation Age of Onset   COPD Mother    Hypertension Mother    Diabetes Mother    Stroke Mother    Coronary artery disease Mother    Diabetes Father    Hypertension Father    Coronary artery disease Father    Kidney disease Father        on dialysis, died from heart attack while being dialzed   Pancreatitis Maternal Aunt    Cancer Maternal Aunt        unknown primary   Anesthesia problems Neg Hx    Colon cancer Neg Hx     Social History Social History   Tobacco Use   Smoking status: Every Day    Current packs/day: 0.50    Average packs/day: 0.5 packs/day for 33.4 years (16.7 ttl pk-yrs)    Types: Cigarettes    Start date: 07/06/1989   Smokeless tobacco: Never  Vaping Use   Vaping status: Never Used  Substance Use Topics   Alcohol use: No    Alcohol/week: 0.0 standard drinks of alcohol    Comment: rarely   Drug use: Not Currently    Frequency: 1.0 times per week    Types: Marijuana     Allergies   Losartan and Nsaids   Review of Systems Review of Systems   Physical Exam Triage Vital Signs ED Triage Vitals  Encounter Vitals Group     BP 12/10/22  1809 (!) 154/89     Systolic BP Percentile --      Diastolic BP Percentile --      Pulse Rate 12/10/22 1809 75     Resp 12/10/22 1809 20     Temp 12/10/22 1809 98.2 F (36.8 C)     Temp Source 12/10/22 1809 Oral     SpO2 12/10/22 1809 96 %     Weight --      Height --      Head Circumference --      Peak Flow --      Pain Score 12/10/22 1812 4     Pain Loc --      Pain Education --      Exclude from Growth Chart --    No data found.  Updated Vital Signs BP (!) 154/89 (BP Location: Right Arm)   Pulse 75   Temp 98.2 F (36.8 C) (Oral)   Resp 20   LMP 12/28/2010   SpO2 96%   Visual Acuity Right Eye Distance:   Left Eye Distance:   Bilateral Distance:    Right Eye Near:   Left Eye Near:    Bilateral Near:     Physical Exam Vitals and nursing note reviewed.  Constitutional:  General: She is not in acute distress.    Appearance: Normal appearance.  HENT:     Head: Normocephalic.  Eyes:     General: Lids are normal. No visual field deficit.       Left eye: Discharge (clear) present.    Extraocular Movements: Extraocular movements intact.     Left eye: Normal extraocular motion and no nystagmus.     Conjunctiva/sclera:     Left eye: Left conjunctiva is injected. No chemosis, exudate or hemorrhage.    Pupils: Pupils are equal, round, and reactive to light.  Pulmonary:     Effort: Pulmonary effort is normal.  Musculoskeletal:     Cervical back: Normal range of motion.  Skin:    General: Skin is warm and dry.  Neurological:     General: No focal deficit present.     Mental Status: She is alert and oriented to person, place, and time.  Psychiatric:        Mood and Affect: Mood normal.        Behavior: Behavior normal.      UC Treatments / Results  Labs (all labs ordered are listed, but only abnormal results are displayed) Labs Reviewed - No data to display  EKG   Radiology No results found.  Procedures Procedures (including critical care  time)  Medications Ordered in UC Medications - No data to display  Initial Impression / Assessment and Plan / UC Course  I have reviewed the triage vital signs and the nursing notes.  Pertinent labs & imaging results that were available during my care of the patient were reviewed by me and considered in my medical decision making (see chart for details).  Patient with moderate tearing and redness of the left eye.  Attempted visual acuity.  Patient denies feeling of foreign object in the eye, do not suspect corneal abrasion.  Will treat empirically with Polytrim eyedrops.  Supportive care recommendations were provided and discussed with the patient's daughter to include warm or cool compresses to the eye, over-the-counter Tylenol for pain or discomfort, and avoiding rubbing or irritating the eye while symptoms persist.  Patient's daughter was advised to follow-up with the patient's primary care physician or eye doctor if symptoms do not improve with this treatment.  Daughter was in agreement with this plan of care and verbalized understanding.  All questions were answered.  Patient stable for discharge.  Final Clinical Impressions(s) / UC Diagnoses   Final diagnoses:  Redness of eye, left  Pain in left eye  History of cataract surgery, left     Discharge Instructions      Use eyedrops as prescribed.   May apply warm compresses as needed for pain or discomfort.  Cool compresses to the eyes to help with pain or swelling. May apply over-the-counter Clear Eyes or Visine eyedrops to help keep the eye moist and lubricated.  Strict handwashing when applying medication.   Avoid rubbing or manipulating the eyes while symptoms persist. If symptoms do not improve with this treatment, please follow-up with her eye doctor or primary care physician for further evaluation. Follow-up as needed.     ED Prescriptions     Medication Sig Dispense Auth. Provider   trimethoprim-polymyxin b (POLYTRIM)  ophthalmic solution Place 1 drop into the left eye every 6 (six) hours for 7 days. 10 mL Leath-Warren, Sadie Haber, NP      PDMP not reviewed this encounter.   Abran Cantor, NP 12/10/22 [phone]

## 2022-12-10 NOTE — Discharge Instructions (Signed)
Use eyedrops as prescribed.   May apply warm compresses as needed for pain or discomfort.  Cool compresses to the eyes to help with pain or swelling. May apply over-the-counter Clear Eyes or Visine eyedrops to help keep the eye moist and lubricated.  Strict handwashing when applying medication.   Avoid rubbing or manipulating the eyes while symptoms persist. If symptoms do not improve with this treatment, please follow-up with her eye doctor or primary care physician for further evaluation. Follow-up as needed.

## 2022-12-24 ENCOUNTER — Other Ambulatory Visit: Payer: Self-pay

## 2022-12-27 ENCOUNTER — Ambulatory Visit: Payer: Medicaid Other | Attending: Cardiology | Admitting: Cardiology

## 2022-12-27 ENCOUNTER — Encounter: Payer: Self-pay | Admitting: Cardiology

## 2022-12-27 VITALS — BP 138/82 | HR 79 | Ht 61.0 in | Wt 107.0 lb

## 2022-12-27 DIAGNOSIS — I25119 Atherosclerotic heart disease of native coronary artery with unspecified angina pectoris: Secondary | ICD-10-CM | POA: Diagnosis not present

## 2022-12-27 DIAGNOSIS — N185 Chronic kidney disease, stage 5: Secondary | ICD-10-CM | POA: Insufficient documentation

## 2022-12-27 DIAGNOSIS — I5020 Unspecified systolic (congestive) heart failure: Secondary | ICD-10-CM | POA: Diagnosis present

## 2022-12-27 DIAGNOSIS — I5022 Chronic systolic (congestive) heart failure: Secondary | ICD-10-CM | POA: Diagnosis present

## 2022-12-27 DIAGNOSIS — Z01810 Encounter for preprocedural cardiovascular examination: Secondary | ICD-10-CM | POA: Insufficient documentation

## 2022-12-27 NOTE — Progress Notes (Signed)
Cardiology Office Note  Date: 12/27/2022   ID: LAUNI TOMASKO, DOB Aug 14, 1970, MRN [id number]  History of Present Illness: [name] is a 52 y.o. female last seen in April.  She is here today with her daughter for a follow-up visit.  I reviewed interval chart.  She continues to follow with Dr. Wolfgang Phoenix and has had progressive renal failure, currently stage V with creatinine 4.61 and plan for peritoneal dialysis.  She plans to undergo laparoscopic peritoneal dialysis catheter placement with omentopexy per Dr. Buzzy Han with [facility] in January 2025.  General anesthesia anticipated.  Also anticipating 48-hour hold on Eliquis preoperatively. RCRI perioperative cardiac risk index is 5, at least 15% chance of major adverse perioperative cardiovascular event.  This is based on comorbid illnesses largely and not a significantly modifiable risk.  I reviewed her medications.  Current cardiac regimen includes Coreg, Lipitor, Eliquis, hydralazine, and as needed nitroglycerin.  She does not report any obvious angina with limited activity.  Weight stable without fluid retention evident.  Physical Exam: VS:  BP 138/82 (BP Location: Left Arm)   Pulse 79   Ht 5\' 1"  (1.549 m)   Wt 107 lb (48.5 kg)   LMP 12/28/2010   SpO2 98%   BMI 20.22 kg/m , BMI Body mass index is 20.22 kg/m.  Wt Readings from Last 3 Encounters:  12/27/22 107 lb (48.5 kg)  11/23/22 108 lb (49 kg)  11/11/22 110 lb (49.9 kg)    General: Chronically ill-appearing woman. HEENT: Conjunctiva and lids normal. Neck: Supple, no elevated JVP, left carotid bruit. Lungs: Decreased breath sounds without wheezing. Cardiac: Regular rate and rhythm, no S3, 1/6 systolic murmur, no pericardial rub. Abdomen: Soft, bowel sounds present. 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  ECG:  An ECG dated 11/05/2021 was personally reviewed today and demonstrated:  Sinus rhythm with leftward axis, decreased R wave progression with  chronic anterolateral T wave abnormalities/inversions.  Labwork: 02/11/2022: ALT 14; AST 17; Platelets 261 02/18/2022: Potassium 5.5; Sodium 135 08/09/2022: BUN 58; Creatinine 3.1; Hemoglobin 10.9     Component Value Date/Time   CHOL 151 04/09/2021 1227   TRIG 75 04/09/2021 1227   HDL 59 04/09/2021 1227   CHOLHDL 2.6 04/09/2021 1227   CHOLHDL 4.2 11/07/2016 0722   VLDL 30 11/07/2016 0722   LDLCALC 77 04/09/2021 1227  November 2024: Hemoglobin A1c 9.5%, BUN 49, creatinine 4.61, GFR 11, potassium 5.1, hemoglobin 12.1, platelets 216  Other Studies Reviewed Today:  No interval cardiac testing for review today.  Assessment and Plan:  1.  Preoperative cardiac evaluation.  Plan is for laparoscopic peritoneal dialysis catheter placement with omentopexy per Dr. Buzzy Han with [facility] in January 2025.  General anesthesia anticipated.  Also anticipating 48-hour hold on Eliquis preoperatively. RCRI perioperative cardiac risk index is 5, at least 15% chance of major adverse perioperative cardiovascular event.  This is based on comorbid illnesses largely and not a significantly modifiable risk.  This risk does not preclude surgery.  1.  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  GDMT limited by CKD stage V and ARB allergy.  She is currently on Coreg and hydralazine.  Plan to update echocardiogram.   2.  CAD status post DES to the LAD in 2014.  No obvious angina with limited activity.  Continue Lipitor and as needed nitroglycerin.   3.  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  She has biopsy-proven membranous glomerulopathy and follows with Dr. Wolfgang Phoenix.   4.  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  She is on chronic anticoagulation with Eliquis.  Disposition:  Follow up  2 to 3 months.  Signed, Jonelle Sidle, M.D., F.A.C.C. [facility]

## 2022-12-27 NOTE — Patient Instructions (Addendum)
Medication Instructions:   Your physician recommends that you continue on your current medications as directed. Please refer to the Current Medication list given to you today.   Labwork: None today  Testing/Procedures: Your physician has requested that you have an echocardiogram. Echocardiography is a painless test that uses sound waves to create images of your heart. It provides your doctor with information about the size and shape of your heart and how well your heart's chambers and valves are working. This procedure takes approximately one hour. There are no restrictions for this procedure. Please do NOT wear cologne, perfume, aftershave, or lotions (deodorant is allowed). Please arrive 15 minutes prior to your appointment time.  Please note: We ask at that you not bring children with you during ultrasound (echo/ vascular) testing. Due to room size and safety concerns, children are not allowed in the ultrasound rooms during exams. Our front office staff cannot provide observation of children in our lobby area while testing is being conducted. An adult accompanying a patient to their appointment will only be allowed in the ultrasound room at the discretion of the ultrasound technician under special circumstances. We apologize for any inconvenience.   Follow-Up: 2-3 months with Dr.McDowell or APP  Any Other Special Instructions Will Be Listed Below (If Applicable).  If you need a refill on your cardiac medications before your next appointment, please call your pharmacy.

## 2023-01-06 ENCOUNTER — Ambulatory Visit: Payer: Medicaid Other | Admitting: Internal Medicine

## 2023-01-06 ENCOUNTER — Encounter: Payer: Self-pay | Admitting: Internal Medicine

## 2023-01-06 VITALS — BP 152/82 | HR 69 | Ht 61.0 in | Wt 107.0 lb

## 2023-01-06 DIAGNOSIS — K3184 Gastroparesis: Secondary | ICD-10-CM

## 2023-01-06 DIAGNOSIS — Z0001 Encounter for general adult medical examination with abnormal findings: Secondary | ICD-10-CM

## 2023-01-06 DIAGNOSIS — H1032 Unspecified acute conjunctivitis, left eye: Secondary | ICD-10-CM

## 2023-01-06 DIAGNOSIS — E1059 Type 1 diabetes mellitus with other circulatory complications: Secondary | ICD-10-CM

## 2023-01-06 DIAGNOSIS — Z89512 Acquired absence of left leg below knee: Secondary | ICD-10-CM | POA: Diagnosis not present

## 2023-01-06 DIAGNOSIS — I739 Peripheral vascular disease, unspecified: Secondary | ICD-10-CM | POA: Diagnosis not present

## 2023-01-06 DIAGNOSIS — I10 Essential (primary) hypertension: Secondary | ICD-10-CM

## 2023-01-06 DIAGNOSIS — Z794 Long term (current) use of insulin: Secondary | ICD-10-CM

## 2023-01-06 DIAGNOSIS — E440 Moderate protein-calorie malnutrition: Secondary | ICD-10-CM

## 2023-01-06 DIAGNOSIS — H5712 Ocular pain, left eye: Secondary | ICD-10-CM

## 2023-01-06 DIAGNOSIS — E1143 Type 2 diabetes mellitus with diabetic autonomic (poly)neuropathy: Secondary | ICD-10-CM | POA: Insufficient documentation

## 2023-01-06 DIAGNOSIS — F411 Generalized anxiety disorder: Secondary | ICD-10-CM

## 2023-01-06 DIAGNOSIS — N186 End stage renal disease: Secondary | ICD-10-CM

## 2023-01-06 MED ORDER — OFLOXACIN 0.3 % OP SOLN
1.0000 [drp] | Freq: Four times a day (QID) | OPHTHALMIC | 0 refills | Status: DC
Start: 2023-01-06 — End: 2023-05-09

## 2023-01-06 MED ORDER — CARVEDILOL 6.25 MG PO TABS
6.2500 mg | ORAL_TABLET | Freq: Two times a day (BID) | ORAL | 3 refills | Status: DC
Start: 2023-01-06 — End: 2023-10-31

## 2023-01-06 MED ORDER — HYDRALAZINE HCL 10 MG PO TABS
10.0000 mg | ORAL_TABLET | Freq: Two times a day (BID) | ORAL | 3 refills | Status: DC
Start: 2023-01-06 — End: 2023-05-09

## 2023-01-06 MED ORDER — MIRTAZAPINE 7.5 MG PO TABS
7.5000 mg | ORAL_TABLET | Freq: Every day | ORAL | 5 refills | Status: DC
Start: 2023-01-06 — End: 2023-05-09

## 2023-01-06 MED ORDER — METOCLOPRAMIDE HCL 5 MG PO TABS: 5.0000 mg | ORAL_TABLET | Freq: Three times a day (TID) | ORAL | 1 refills | Status: DC | PRN

## 2023-01-06 NOTE — Assessment & Plan Note (Addendum)
Physical exam as documented. Recent blood tests reviewed. Flu vaccine denied.

## 2023-01-06 NOTE — Progress Notes (Signed)
Established Patient Office Visit  Subjective:  Patient ID: [name], female    DOB: [birth year]  Age: 52 y.o. MRN: [id number]  CC:  Chief Complaint  Patient presents with   Annual Exam   eyes     Watery, red, swollen eyes    Emesis    Off and on throwing up    HPI [name] is a 52 y.o. 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 Her daughter was present during the visit.  She reports left eye pain, redness and clear discharge for the last 2 weeks.  She was given polymyxin-trimethoprim eyedrops from the ER on 12/10/22, and had felt better initially.  She has recurrence of symptoms now.  She also reports headache, which is generalized, sharp and nonradiating.  Type I DM: Her last HbA1C was 9.1 in 11/24, which is worse than prior.  She has been taking [place] 12 units at bedtime and follows insulin sliding scale.  Her daughter checks her blood sugars before meals and at bedtime, which have been variable.  She has been following up with endocrinology for it.  Of note, she skips meals most of the days, and cannot take mealtime insulin due to it.   HTN: Her blood pressure is elevated today.  She is supposed supposed to take carvedilol 6.25 mg twice daily and hydralazine 10 mg twice daily, but has not taken them recently due to episodes of nausea/vomiting.  Denies any chest pain or dyspnea currently.  She also has progressive CKD, last CMP reviewed.  She denies any dysuria, hematuria or urinary hesitancy.  She follows up with Dr. Wolfgang Phoenix.  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  She was prescribed Rituxan, but she has opted to avoid it for now due to its side effects. She is planned to start peritoneal dialysis.    She also complains of severe spells of anxiety and insomnia.  She also reports anhedonia, fatigue and lack of concentration.  Denies SI or HI.  She also reports loss of weight in the past, but her daughter reports that she has very poor p.o. intake. She was given Remeron 15 mg qHS, but had episodes of confusion with it.  She has been given Remeron 7.5 mg dose, but does not take it despite repeated counseling.  Her weight has been stable compared to the last visit.  Her insomnia had also improved with Remeron in the past.  She complains of chronic low back pain.  She has difficulty ambulating, and is mostly wheelchair-bound or bedridden.  Her daughter helps her with ADLs.  Does not have any sacral or pressure ulcer currently.  She is on oxycodone for chronic pain.  She also has radiating symptoms to RLE, but did not like gabapentin or amitriptyline for neuropathy.  She has had adjustment in her prosthetic and has been feeling better now.        Past Medical History:  Diagnosis Date   Acquired absence of left leg below knee (HCC)    Anxiety    was on xanax for years but discontinued prior to prior PCP retiring as she was on opioids and was told she would not find someone to write for both   Arterial thromboembolism (HCC)    Prior  embolectomy, previously on Coumadin   Cataract    Phreesia 11/19/2019   Chronic kidney disease    Phreesia 11/19/2019   Clotting disorder (HCC)    Phreesia 11/19/2019   Coronary atherosclerosis of native coronary artery    a. s/p DES to LAD in 2014   Depression    Depression    Phreesia 11/19/2019   Diabetes mellitus without complication (HCC)    Phreesia 11/19/2019   Diabetic ketoacidosis with coma associated with type 1 diabetes mellitus (HCC)    DKA, type 1 (HCC) 11/17/2016   Facial cellulitis    12/2010   Glomerulonephritis    Headache(784.0)    Hemiplegia, unspecified affecting right dominant side (HCC)    History of stroke    Hyperlipidemia     Insulin dependent diabetes mellitus    History of diabetic ketoacidosis   Ischemic cardiomyopathy    a. EF 30-35% in 2018 b. EF at 45-50% by repeat imaging in 02/2019   Left carotid artery occlusion    Major depressive disorder, single episode, unspecified    N&V (nausea and vomiting) 06/03/2021   Pancreatitis, acute 11/07/2016   Peripheral arterial disease (HCC)    Shingles    11/2010   ST elevation myocardial infarction (STEMI) of anterior wall [facility])    Late presentation September 2014   Stroke [facility])    2018   Upper abdominal pain 06/03/2021    Past Surgical History:  Procedure Laterality Date   AMPUTATION  03/03/2011   Procedure: AMPUTATION DIGIT;  Surgeon: Nadara Mustard, MD;  Location: [facility] OR;  Service: Orthopedics;  Laterality: Left;  Left Foot Amputation 4th and 5th toes at MTP joint   AMPUTATION  04/22/2011   Procedure: AMPUTATION BELOW KNEE;  Surgeon: Nadara Mustard, MD;  Location: [facility] OR;  Service: Orthopedics;  Laterality: Left;  Left Below Knee Amputation   BIOPSY  07/17/2021   Procedure: BIOPSY;  Surgeon: Lanelle Bal, DO;  Location: [facility] ENDO SUITE;  Service: Endoscopy;;   CATARACT EXTRACTION W/PHACO Left 01/09/2020   Procedure: CATARACT EXTRACTION PHACO AND INTRAOCULAR LENS PLACEMENT (IOC);  Surgeon: Fabio Pierce, MD;  Location: [facility] ORS;  Service: Ophthalmology;  Laterality: Left;  CDE: 9.73   COLONOSCOPY WITH PROPOFOL N/A 12/14/2021   Procedure: COLONOSCOPY WITH PROPOFOL;  Surgeon: Lanelle Bal, DO;  Location: [facility] ENDO SUITE;  Service: Endoscopy;  Laterality: N/A;  9:45 am   DILATION AND CURETTAGE OF UTERUS     EMBOLECTOMY  01/29/2011   Procedure: EMBOLECTOMY;  Surgeon: Pryor Ochoa, MD;  Location: [facility] OR;  Service: Vascular;  Laterality: Left;  Left Popliteal and Tibial Embolectomy with patch angioplasty   ESOPHAGOGASTRODUODENOSCOPY (EGD) WITH PROPOFOL N/A 07/17/2021   Procedure: ESOPHAGOGASTRODUODENOSCOPY (EGD) WITH PROPOFOL;  Surgeon: Lanelle Bal, DO;   Location: [facility] ENDO [facility]ITE;  Service: Endoscopy;  Laterality: N/A;  1:00pm   INCISION AND DRAINAGE ABSCESS N/A 04/06/2016   Procedure: INCISION AND DRAINAGE ABSCESS;  Surgeon: Tilda Burrow, MD;  Location: [facility] ORS;  Service: Gynecology;  Laterality: N/A;   LEFT HEART CATHETERIZATION WITH CORONARY ANGIOGRAM N/A 10/01/2012   Procedure: LEFT HEART CATHETERIZATION WITH CORONARY ANGIOGRAM;  Surgeon: Peter M Swaziland, MD;  Location: [facility] CATH LAB;  Service: Cardiovascular;  Laterality: N/A;   LOWER EXTREMITY ANGIOGRAM N/A 01/27/2011   Procedure: LOWER EXTREMITY ANGIOGRAM;  Surgeon: Larina Earthly, MD;  Location: [facility] CATH LAB;  Service: Cardiovascular;  Laterality: N/A;   LOWER EXTREMITY ANGIOGRAM N/A 01/28/2011   Procedure: LOWER EXTREMITY ANGIOGRAM;  Surgeon: Fransisco Hertz, MD;  Location: [facility] CATH LAB;  Service: Cardiovascular;  Laterality: N/A;   LOWER EXTREMITY ANGIOGRAM Left 01/29/2011   Procedure: LOWER EXTREMITY ANGIOGRAM;  Surgeon: Pryor Ochoa, MD;  Location: [facility] CATH LAB;  Service: Cardiovascular;  Laterality: Left;   MULTIPLE TOOTH EXTRACTIONS     POLYPECTOMY  12/14/2021   Procedure: POLYPECTOMY;  Surgeon: Lanelle Bal, DO;  [facility];  Service: Endoscopy;;   TEE WITHOUT CARDIOVERSION  04/15/2011   Procedure: TRANSESOPHAGEAL ECHOCARDIOGRAM (TEE);  Surgeon: Kathlen Brunswick, MD;  Location[facility];  Service: Cardiovascular;  Laterality: N/A;   WRIST SURGERY     Left    Family History  Problem Relation Age of Onset   COPD Mother    Hypertension Mother    Diabetes Mother    Stroke Mother    Coronary artery disease Mother    Diabetes Father    Hypertension Father    Coronary artery disease Father    Kidney disease Father        on dialysis, died from heart attack while being dialzed   Pancreatitis Maternal Aunt    Cancer Maternal Aunt        unknown primary   Anesthesia problems Neg Hx    Colon cancer Neg Hx     Social History   Socioeconomic History   Marital  status: Single    Spouse name: Not on file   Number of children: 1   Years of education: Not on file   Highest education level: Not on file  Occupational History   Not on file  Tobacco Use   Smoking status: Every Day    Current packs/day: 0.50    Average packs/day: 0.5 packs/day for 33.5 years (16.8 ttl pk-yrs)    Types: Cigarettes    Start date: 07/06/1989   Smokeless tobacco: Never  Vaping Use   Vaping status: Never Used  Substance and Sexual Activity   Alcohol use: No    Alcohol/week: 0.0 standard drinks of alcohol    Comment: rarely   Drug use: Not Currently    Frequency: 1.0 times per week    Types: Marijuana   Sexual activity: Not Currently    Partners: Male    Comment: pt stated that her tubal was only 50% effective- "not cut and burned"  Other Topics Concern   Not on file  Social History Narrative   Unemployed.  Lives in [place] with [place], [place], and mother.  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   Social Drivers of Corporate investment banker Strain: Low Risk  (12/23/2022)   Received from [facility]   Overall Financial Resource Strain (CARDIA)    Difficulty of Paying Living Expenses: Not hard at all  Food Insecurity: No Food Insecurity (12/23/2022)   Received from [facility]   Hunger Vital Sign    Worried About Running Out of Food in the Last Year: Never true    Ran Out of Food in the Last Year: Never true  Transportation Needs: No Transportation Needs (12/23/2022)   Received from [facility] - Transportation    Lack of Transportation (Medical): No    Lack of Transportation (Non-Medical): No  Physical Activity: Unknown (12/23/2022)   Received from [facility]   Exercise Vital Sign    Days of Exercise per Week: 0 days    Minutes of Exercise per Session: Not on file  Recent Concern: Physical Activity - Inactive (  11/11/2022)   Exercise Vital Sign    Days of Exercise per Week: 0 days    Minutes of Exercise per Session: 0 min   Stress: Stress Concern Present (12/23/2022)   Received from [facility] - Occupational Stress Questionnaire    Feeling of Stress : To some extent  Social Connections: Moderately Integrated (12/23/2022)   Received from [facility]   Social Network    How would you rate your social network (family, work, friends)?: Adequate participation with social networks  Recent Concern: Social Connections - Socially Isolated (11/11/2022)   Social Connection and Isolation Panel [NHANES]    Frequency of Communication with Friends and Family: Once a week    Frequency of Social Gatherings with Friends and Family: Once a week    Attends Religious Services: Never    Database administrator or Organizations: No    Attends Banker Meetings: Never    Marital Status: Never married  Intimate Partner Violence: Not At Risk (12/23/2022)   Received from [facility]   HITS    Over the last 12 months how often did your partner physically hurt you?: Never    Over the last 12 months how often did your partner insult you or talk down to you?: Never    Over the last 12 months how often did your partner threaten you with physical harm?: Never    Over the last 12 months how often did your partner scream or curse at you?: Never    Outpatient Medications Prior to Visit  Medication Sig Dispense Refill   oxyCODONE-acetaminophen (PERCOCET) 7.5-325 MG tablet Take 1 tablet by mouth every 6 (six) hours as needed for severe pain (pain score 7-10). Up to 5 times daily.     acetaminophen (TYLENOL) 500 MG tablet Take 500 mg by mouth every 6 (six) hours as needed for moderate pain.     atorvastatin (LIPITOR) 80 MG tablet TAKE ONE TABLET (80MG  TOTAL) BY MOUTH DAILY 90 tablet 3   calcium carbonate (TUMS EX) 750 MG chewable tablet Chew 1 tablet by mouth daily as needed for heartburn.     cetirizine (ZYRTEC) 10 MG tablet Take 10 mg by mouth daily as needed for allergies.      Continuous Blood Gluc Sensor (DEXCOM G7 SENSOR) MISC Inject 1 Application into the skin as directed. Change sensor every 10 days as directed. 9 each 3   ELIQUIS 2.5 MG TABS tablet TAKE ONE TABLET (2.5MG  TOTAL) BY MOUTH TWO TIMES DAILY 180 tablet 1   glucose blood (ACCU-CHEK GUIDE) test strip Use as instructed to monitor glucose 4 times daily, before meals and before bed. 400 each 12   insulin aspart (NOVOLOG) 100 UNIT/ML FlexPen Inject 1-7 Units into the skin 3 (three) times daily with meals. 15 mL 3   Insulin Pen Needle (UNIFINE PENTIPS) 31G X 8 MM MISC USE TO INJECT INSULIN SUBCUTANEOUSLY . USE FOR BOTH SLIDING SCALE AND FOR DAILYINSULIN DOSES. 100 each 11   linaclotide (LINZESS) 290 MCG CAPS capsule Take 1 capsule (290 mcg total) by mouth daily before breakfast. 90 capsule 3   Misc. Devices MISC Wheelchair cushion - small. 1 each 0   nitroGLYCERIN (NITROSTAT) 0.4 MG SL tablet PLACE ONE TABLET (0.4MG  TOTAL) UNDER THETONGUE EVERY FIVE MINUTES AS NEEDED FOR CHEST PAIN 25 tablet 2   omeprazole (PRILOSEC) 20 MG capsule TAKE ONE (1) CAPSULE BY MOUTH TWICE A DAY. (EVERY 12 HOURS.) BEFORE A MEAL 60  capsule 3   RITUXAN 500 MG/50ML injection Inject 100 mLs (1,000 mg total) into the vein every 14 (fourteen) days. Courier to [facility]. [address] [place] [address] Attn: Serafina Royals. (pharmacy dept) 200 mL 0   simethicone (GAS-X) 80 MG chewable tablet Chew 80 mg by mouth every 6 (six) hours as needed for flatulence.     TRESIBA FLEXTOUCH 100 UNIT/ML FlexTouch Pen Inject 17 Units into the skin at bedtime. (Patient taking differently: Inject 12 Units into the skin at bedtime.) 15 mL 3   carvedilol (COREG) 6.25 MG tablet TAKE ONE TABLET (6.25MG  TOTAL) BY MOUTH TWO TIMES DAILY WITH A MEAL 180 tablet 3   hydrALAZINE (APRESOLINE) 10 MG tablet Take 1 tablet (10 mg total) by mouth 2 (two) times daily. 60 tablet 3   mirtazapine (REMERON) 7.5 MG tablet Take 1 tablet (7.5 mg total) by mouth at bedtime. 30 tablet 5    ondansetron (ZOFRAN) 4 MG tablet TAKE ONE TABLET BY MOUTH (4MG ) EVERY 4 TO 6 HOURS AS NEEDED FOR NAUSEA OR VOMITING 90 tablet 1   oxyCODONE-acetaminophen (PERCOCET) 10-325 MG tablet Take 0.5 tablets every 4 (four) hours as needed by mouth for pain. (Patient taking differently: Take 1 tablet by mouth every 4 (four) hours as needed for pain.) 120 tablet 0   No facility-administered medications prior to visit.    Allergies  Allergen Reactions   Losartan Swelling   Nsaids     Kidney disease    ROS Review of Systems  Constitutional:  Negative for chills and fever.  HENT:  Negative for congestion and sore throat.   Eyes:  Positive for pain, discharge, redness and visual disturbance.  Respiratory:  Negative for cough and shortness of breath.   Cardiovascular:  Negative for chest pain and palpitations.  Gastrointestinal:  Positive for nausea and vomiting. Negative for abdominal pain.  Endocrine: Negative for polydipsia and polyuria.  Genitourinary:  Negative for dysuria and hematuria.  Musculoskeletal:  Positive for arthralgias and back pain. Negative for neck pain and neck stiffness.  Skin:  Negative for rash.  Neurological:  Negative for dizziness and weakness.  Psychiatric/Behavioral:  Negative for agitation and behavioral problems. The patient is nervous/anxious.       Objective:    Physical Exam Vitals reviewed.  Constitutional:      General: She is not in acute distress.    Appearance: She is cachectic. She is not diaphoretic.     Comments: In wheelchair  HENT:     Head: Normocephalic and atraumatic.     Nose: Nose normal.     Mouth/Throat:     Mouth: Mucous membranes are moist.  Eyes:     General: No scleral icterus.       Left eye: Discharge present.    Extraocular Movements: Extraocular movements intact.     Conjunctiva/sclera:     Left eye: Left conjunctiva is injected.  Cardiovascular:     Rate and Rhythm: Normal rate and regular rhythm.     Heart sounds:  Normal heart sounds. No murmur heard. Pulmonary:     Breath sounds: Normal breath sounds. No wheezing or rales.  Abdominal:     Palpations: Abdomen is soft.     Tenderness: There is abdominal tenderness (Mild, generalized).  Musculoskeletal:     Cervical back: Neck supple. No tenderness.     Comments: S/p left BKA, prosthesis in place  Skin:    General: Skin is warm.  Neurological:     General:  No focal deficit present.     Mental Status: She is alert and oriented to person, place, and time.     Sensory: Sensory deficit present.     Motor: Weakness (Right LE) present.  Psychiatric:        Mood and Affect: Mood normal.        Behavior: Behavior normal.     BP (!) 152/82 (BP Location: Left Arm)   Pulse 69   Ht 5\' 1"  (1.549 m)   Wt 107 lb (48.5 kg)   LMP 12/28/2010   SpO2 98%   BMI 20.22 kg/m  Wt Readings from Last 3 Encounters:  01/06/23 107 lb (48.5 kg)  12/27/22 107 lb (48.5 kg)  11/23/22 108 lb (49 kg)    Lab Results  Component Value Date   TSH 3.070 04/09/2021   Lab Results  Component Value Date   WBC 8.5 02/11/2022   HGB 10.9 (A) 08/09/2022   HCT 32 (A) 08/09/2022   MCV 93 02/11/2022   PLT 261 02/11/2022   Lab Results  Component Value Date   NA 135 02/18/2022   K 5.5 (H) 02/18/2022   CO2 18 (L) 02/18/2022   GLUCOSE 415 (H) 02/18/2022   BUN 58 (A) 08/09/2022   CREATININE 3.1 (A) 08/09/2022   BILITOT 0.3 02/11/2022   ALKPHOS 110 02/11/2022   AST 17 02/11/2022   ALT 14 02/11/2022   PROT 6.5 02/11/2022   ALBUMIN 4.1 02/11/2022   CALCIUM 8.8 02/18/2022   ANIONGAP 4 (L) 12/08/2021   EGFR 18 08/09/2022   Lab Results  Component Value Date   CHOL 151 04/09/2021   Lab Results  Component Value Date   HDL 59 04/09/2021   Lab Results  Component Value Date   LDLCALC 77 04/09/2021   Lab Results  Component Value Date   TRIG 75 04/09/2021   Lab Results  Component Value Date   CHOLHDL 2.6 04/09/2021   Lab Results  Component Value Date   HGBA1C  9.1 (A) 11/23/2022      Assessment & Plan:   Problem List Items Addressed This Visit       Cardiovascular and Mediastinum   PAD (peripheral artery disease) (HCC)   Related to type 1 DM S/p left AKA On Eliquis due to coagulopathy      Relevant Medications   hydrALAZINE (APRESOLINE) 10 MG tablet   carvedilol (COREG) 6.25 MG tablet   Essential hypertension   BP Readings from Last 1 Encounters:  01/06/23 (!) 162/81   Uncontrolled with Coreg 6.25 mg twice daily Had added hydralazine at lower dose-10 mg BID, needs to be compliant Counseled for compliance with the medications Advised DASH diet      Relevant Medications   hydrALAZINE (APRESOLINE) 10 MG tablet   carvedilol (COREG) 6.25 MG tablet     Digestive   Diabetic gastroparesis (HCC)   Her chronic nausea could be due to diabetic gastroparesis as well She takes Zofran without much relief Switched to Reglan PRN      Relevant Medications   metoCLOPramide (REGLAN) 5 MG tablet     Endocrine   Diabetes mellitus type I (HCC)   Lab Results  Component Value Date   HGBA1C 9.1 (A) 11/23/2022   Uncontrolled, but improving Associated with PAD On Tresiba 12 U qHS and ISS, followed by Endocrinology Advised to follow diabetic diet - needs to eat at regular intervals and take mealtime insulin On statin Diabetic eye exam: Advised to follow up with Ophthalmology  Genitourinary   ESRD (end stage renal disease) (HCC)   Related to uncontrolled type 2 DM Progressive CKD Followed by Nephrology -plan to start peritoneal dialysis        Other   GAD (generalized anxiety disorder) (Chronic)   Considering her weight loss due to lack of appetite, continue Remeron at a lower dose now - did not tolerate 15 mg dose Her chronic medical conditions are also contributing to her depression and anxiety Did not tolerate Elavil      Relevant Medications   mirtazapine (REMERON) 7.5 MG tablet   Hx of BKA, left (HCC)   For history  of PVD Had referred to prosthetic clinic for adjustment of prosthetic -better tolerating new prosthetic      Encounter for general adult medical examination with abnormal findings - Primary   Physical exam as documented. Recent blood tests reviewed. Flu vaccine denied.      Moderate protein-calorie malnutrition (HCC)   BMI Readings from Last 3 Encounters:  01/06/23 20.22 kg/m  12/27/22 20.22 kg/m  11/23/22 20.41 kg/m   Due to poor p.o. intake Needs to eat at regular intervals and take mealtime insulin Needs to continue Remeron Needs to take protein supplement daily      Relevant Medications   mirtazapine (REMERON) 7.5 MG tablet   Acute conjunctivitis of left eye   Started ofloxacin eyedrops Considering her left eye pain and headache, could be glaucoma Refer to ophthalmology      Relevant Medications   ofloxacin (OCUFLOX) 0.3 % ophthalmic solution   Other Visit Diagnoses       Left eye pain       Relevant Orders   Ambulatory referral to Ophthalmology       Meds ordered this encounter  Medications   ofloxacin (OCUFLOX) 0.3 % ophthalmic solution    Sig: Place 1 drop into the left eye 4 (four) times daily.    Dispense:  5 mL    Refill:  0   metoCLOPramide (REGLAN) 5 MG tablet    Sig: Take 1 tablet (5 mg total) by mouth every 8 (eight) hours as needed for nausea.    Dispense:  90 tablet    Refill:  1   hydrALAZINE (APRESOLINE) 10 MG tablet    Sig: Take 1 tablet (10 mg total) by mouth 2 (two) times daily.    Dispense:  60 tablet    Refill:  3   carvedilol (COREG) 6.25 MG tablet    Sig: Take 1 tablet (6.25 mg total) by mouth 2 (two) times daily with a meal.    Dispense:  180 tablet    Refill:  3   mirtazapine (REMERON) 7.5 MG tablet    Sig: Take 1 tablet (7.5 mg total) by mouth at bedtime.    Dispense:  30 tablet    Refill:  5    Follow-up: Return in about 4 months (around 05/07/2023).    Anabel Halon, MD

## 2023-01-06 NOTE — Assessment & Plan Note (Signed)
Started ofloxacin eyedrops Considering her left eye pain and headache, could be glaucoma Refer to ophthalmology

## 2023-01-06 NOTE — Assessment & Plan Note (Addendum)
For history of PVD Had referred to prosthetic clinic for adjustment of prosthetic -better tolerating new prosthetic

## 2023-01-06 NOTE — Assessment & Plan Note (Signed)
BMI Readings from Last 3 Encounters:  01/06/23 20.22 kg/m  12/27/22 20.22 kg/m  11/23/22 20.41 kg/m   Due to poor p.o. intake Needs to eat at regular intervals and take mealtime insulin Needs to continue Remeron Needs to take protein supplement daily

## 2023-01-06 NOTE — Assessment & Plan Note (Addendum)
BP Readings from Last 1 Encounters:  01/06/23 (!) 162/81   Uncontrolled with Coreg 6.25 mg twice daily Had added hydralazine at lower dose-10 mg BID, needs to be compliant Counseled for compliance with the medications Advised DASH diet

## 2023-01-06 NOTE — Assessment & Plan Note (Signed)
Related to type 1 DM S/p left AKA On Eliquis due to coagulopathy 

## 2023-01-06 NOTE — Assessment & Plan Note (Addendum)
Lab Results  Component Value Date   HGBA1C 9.1 (A) 11/23/2022   Uncontrolled, but improving Associated with PAD On Tresiba 12 U qHS and ISS, followed by Endocrinology Advised to follow diabetic diet - needs to eat at regular intervals and take mealtime insulin On statin Diabetic eye exam: Advised to follow up with Ophthalmology

## 2023-01-06 NOTE — Assessment & Plan Note (Deleted)
Due to PAD 

## 2023-01-06 NOTE — Assessment & Plan Note (Signed)
Her chronic nausea could be due to diabetic gastroparesis as well She takes Zofran without much relief Switched to Reglan PRN

## 2023-01-06 NOTE — Assessment & Plan Note (Signed)
Related to uncontrolled type 2 DM Progressive CKD Followed by Nephrology -plan to start peritoneal dialysis

## 2023-01-06 NOTE — Patient Instructions (Addendum)
Please start applying Ofloxacin eye drops.  Please start taking Reglan for nausea. Please maintain small, frequent meals.  Please continue to take other medications as prescribed.  Please continue to follow low carb diet.  You are being referred to [facility].  [address] [place], [place] [address]  Local: [phone] Toll-free: [phone]

## 2023-01-06 NOTE — Assessment & Plan Note (Signed)
Considering her weight loss due to lack of appetite, continue Remeron at a lower dose now - did not tolerate 15 mg dose Her chronic medical conditions are also contributing to her depression and anxiety Did not tolerate Elavil

## 2023-01-07 ENCOUNTER — Encounter: Payer: Self-pay | Admitting: Nurse Practitioner

## 2023-01-07 ENCOUNTER — Ambulatory Visit (HOSPITAL_COMMUNITY): Payer: Medicaid Other

## 2023-01-10 ENCOUNTER — Other Ambulatory Visit (HOSPITAL_COMMUNITY): Payer: Self-pay

## 2023-01-10 ENCOUNTER — Telehealth: Payer: Self-pay | Admitting: *Deleted

## 2023-01-10 ENCOUNTER — Other Ambulatory Visit: Payer: Self-pay | Admitting: *Deleted

## 2023-01-10 NOTE — Telephone Encounter (Signed)
Patient's daughter left a message. She states that they have been trying to get her mother's Tresiba refill.Pharmacy has told them that a PA is needed. Patient has been out for a few days per her daughter she is DM type 1. May we ask that a urgent PA be completed as soon as possible?

## 2023-01-11 ENCOUNTER — Telehealth: Payer: Self-pay | Admitting: Pharmacy Technician

## 2023-01-11 ENCOUNTER — Other Ambulatory Visit (HOSPITAL_COMMUNITY): Payer: Self-pay

## 2023-01-11 NOTE — Telephone Encounter (Signed)
Pharmacy Patient Advocate Encounter   Received notification from Patient Advice Request messages that prior authorization for Evaristo Bury is required/requested.   Insurance verification completed.   The patient is insured through [facility] MEDICAID .   Per test claim: PA required: PA started via [employer] form ([place] Tracks down)  However, please see clinical questions below that I'm not finding the answer to in her chart and advise. Lantus and Levemir are preferred. Lantus is $0, but I'm not able to get a test claim for the levemir. It appears the ones in our system are discontinued. If suggested medication is appropriate, Please send in a new RX and discontinue this one. If not, please answer the questions below.

## 2023-01-11 NOTE — Telephone Encounter (Signed)
Please close this encounter. PA request has been  Started . New Encounter created for follow up. For additional info see Pharmacy Prior Auth telephone encounter from 01/11/23.

## 2023-01-11 NOTE — Telephone Encounter (Signed)
Whitney sent [name] a msg yesterday, but I wasn't able to get into [place] to do the PA. Working on it now. See other encounter for follow up. Closing this one.

## 2023-01-11 NOTE — Telephone Encounter (Signed)
Just noting so you know we (PA team) received your message.  Track is having issues, but I'll call Medicaid as soon as they open today.

## 2023-01-13 ENCOUNTER — Ambulatory Visit (HOSPITAL_COMMUNITY)
Admission: RE | Admit: 2023-01-13 | Discharge: 2023-01-13 | Disposition: A | Discharge status: Home / Self Care | Payer: Medicaid Other | Source: Ambulatory Visit | Attending: Cardiology | Admitting: Cardiology

## 2023-01-13 ENCOUNTER — Other Ambulatory Visit: Payer: Self-pay | Admitting: Nurse Practitioner

## 2023-01-13 DIAGNOSIS — I428 Other cardiomyopathies: Secondary | ICD-10-CM

## 2023-01-13 DIAGNOSIS — I5020 Unspecified systolic (congestive) heart failure: Secondary | ICD-10-CM | POA: Diagnosis present

## 2023-01-13 LAB — ECHOCARDIOGRAM COMPLETE
Area-P 1/2: 4.39 cm2
MV VTI: 1 cm2
S' Lateral: 3.1 cm

## 2023-01-13 MED ORDER — LANTUS SOLOSTAR 100 UNIT/ML ~~LOC~~ SOPN
20.0000 [IU] | PEN_INJECTOR | Freq: Every day | SUBCUTANEOUS | 3 refills | Status: AC
Start: 2023-01-13 — End: ?

## 2023-01-13 NOTE — Telephone Encounter (Signed)
I canceled the [place] and sent in a script for Lantus instead.

## 2023-01-13 NOTE — Telephone Encounter (Signed)
I sent in Lantus for her since her insurance will over that one at no cost.

## 2023-01-13 NOTE — Telephone Encounter (Signed)
Noted, [name] has sent ina prescription for the Lantus .

## 2023-01-13 NOTE — Progress Notes (Signed)
*  PRELIMINARY RESULTS* Echocardiogram 2D Echocardiogram has been performed.  Stacey Drain 01/13/2023, 12:20 PM

## 2023-01-20 ENCOUNTER — Other Ambulatory Visit: Payer: Self-pay | Admitting: Cardiology

## 2023-01-20 NOTE — Telephone Encounter (Signed)
 Prescription refill request for Eliquis  received. Indication: Lupus anticoagulant Last office visit: 12/27/22  [name] MD Scr: 4.05 on 01/13/23  Epic Age: 53 Weight: 48.5kg  Based on above findings Eliquis  2.5mg  twice daily is the appropriate dose.  Refill approved.

## 2023-01-25 NOTE — Telephone Encounter (Signed)
 I did call and speak to the patient's daughter,she states that they did get the Lantus and her Cathren Harsh is taking 12 units.

## 2023-02-03 ENCOUNTER — Ambulatory Visit: Payer: Self-pay | Admitting: Internal Medicine

## 2023-02-03 NOTE — Telephone Encounter (Signed)
Chief Complaint: URI symptoms Symptoms: Cough, wheezing, sore throat and runny nose Frequency: since Sunday Pertinent Negatives: Patient denies SOB, CP Disposition: []ED /[]Urgent Care (no appt availability in office) / []Appointment(In office/virtual)/ [] [facility]/ []Home Care/ []Refused Recommended Disposition /[][facility]/ [x] Follow-up with PCP Additional Notes: patient\'s daughter Chelsea who is on the DPR called-states patient has had a cough, wheezing, sore throat and runny nose since Sunday. Patient is next to daughter during conversation. Patient had a peritoneal dialysis catheter placed on 01/28/2023. Daughter states patient had woken up day of procedure not feeling well but was deemed okay for procedure. Patient was at dialysis center today to have her catheter flushed and daughter was told to call PCP by dialysis RN due to hearing wheezing from patient. PD catheter site looks good with no signs of infection according to report from dialysis RN to the daughter. Daughter is wanting a prescription sent for patient symptoms to avoid having to take her out again to be seen in person. She would like to speak with PCP if patient needs to be seen in person. Daughter verbalized understanding of plan and all questions answered.  Copied from CRM #[id number]. Topic: Clinical - Pink Word Triage >> Feb 03, 2023 11:11 AM Leiara R wrote: Reason for CRM: Patient has a cold, lots of coughing and wheezing, sore throat and runny nose. Temp around 99.4 when last checked. Just had surgery recently and has some stomach pains. Not sure if patient needs an appt as she is still recovering. Looking to get some medical advice or a rx sent. OTC meds are not working. Been feeling this way since Sunday [phone] Reason for Disposition  [1] Continuous (nonstop) coughing interferes with work or school AND [2] no improvement using cough treatment per Care Advice  Answer Assessment - Initial  Assessment Questions 1. ONSET: "When did the cough begin?"      Started on Friday 2. SEVERITY: "How bad is the cough today?"      3 out of 10 3. SPUTUM: "Describe the color of your sputum" (none, dry cough; clear, white, yellow, green)     none 4. HEMOPTYSIS: "Are you coughing up any blood?" If so ask: "How much?" (flecks, streaks, tablespoons, etc.)     none 5. DIFFICULTY BREATHING: "Are you having difficulty breathing?" If Yes, ask: "How bad is it?" (e.g., mild, moderate, severe)    - MILD: No SOB at rest, mild SOB with walking, speaks normally in sentences, can lie down, no retractions, pulse < 100.    - MODERATE: SOB at rest, SOB with minimal exertion and prefers to sit, cannot lie down flat, speaks in phrases, mild retractions, audible wheezing, pulse 100-120.    - SEVERE: Very SOB at rest, speaks in single words, struggling to breathe, sitting hunched forward, retractions, pulse > 120      No 6. FEVER: "Do you have a fever?" If Yes, ask: "What is your temperature, how was it measured, and when did it start?"     10 0.1-yesterday 7. CARDIAC HISTORY: "Do you have any history of heart disease?" (e.g., heart attack, congestive heart failure)      HF 8. LUNG HISTORY: "Do you have any history of lung disease?"  (e.g., pulmonary embolus, asthma, emphysema)     No 9. PE RISK FACTORS: "Do you have a history of blood clots?" (or: recent major surgery, recent prolonged travel, bedridden)     No 10. OTHER SYMPTOMS: "Do you have any other  symptoms?" (e.g., runny nose, wheezing, chest pain)       Wheezing, runny nose 12. TRAVEL: "Have you traveled out of the country in the last month?" (e.g., travel history, exposures)       No  Protocols used: Cough - Acute Non-Productive-A-AH

## 2023-02-08 ENCOUNTER — Telehealth (INDEPENDENT_AMBULATORY_CARE_PROVIDER_SITE_OTHER): Payer: Medicaid Other | Admitting: Internal Medicine

## 2023-02-08 ENCOUNTER — Encounter: Payer: Self-pay | Admitting: Internal Medicine

## 2023-02-08 VITALS — Ht 61.0 in | Wt 110.0 lb

## 2023-02-08 DIAGNOSIS — J209 Acute bronchitis, unspecified: Secondary | ICD-10-CM | POA: Diagnosis not present

## 2023-02-08 MED ORDER — AZITHROMYCIN 250 MG PO TABS
ORAL_TABLET | ORAL | 0 refills | Status: AC
Start: 2023-02-08 — End: 2023-02-13

## 2023-02-08 MED ORDER — ALBUTEROL SULFATE HFA 108 (90 BASE) MCG/ACT IN AERS
2.0000 | INHALATION_SPRAY | Freq: Four times a day (QID) | RESPIRATORY_TRACT | 0 refills | Status: AC | PRN
Start: 2023-02-08 — End: ?

## 2023-02-08 MED ORDER — GUAIFENESIN-CODEINE 100-10 MG/5ML PO SOLN
5.0000 mL | Freq: Three times a day (TID) | ORAL | 0 refills | Status: DC | PRN
Start: 2023-02-08 — End: 2023-05-09

## 2023-02-08 NOTE — Patient Instructions (Signed)
Please start taking azithromycin as prescribed.  Please use albuterol inhaler as needed for shortness of breath or wheezing.  Please take guaifenesin-codeine syrup as needed for cough. It can cause drowsiness.

## 2023-02-08 NOTE — Progress Notes (Signed)
Virtual Visit via Video Note   Because of Chistine Basil Luckett's co-morbid illnesses, she is at least at moderate risk for complications without adequate follow up.  This format is felt to be most appropriate for this patient at this time.  All issues noted in this document were discussed and addressed.  A limited physical exam was performed with this format.      Evaluation Performed:  Follow-up visit  Date:  02/08/2023   ID:  [name], DOB [birth year], MRN [id number]  Patient Location: Home Provider Location: Office/Clinic  Participants: Patient and her duaghter Location of Patient: Home Location of Provider: Telehealth Consent was obtain for visit to be over via telehealth. I verified that I am speaking with the correct person using two identifiers.  PCP:  Anabel Halon, MD   Chief Complaint: Cough, sore throat and nasal congestion  History of Present Illness:    [name] is a 53 y.o. female who has a video visit for complaint of cough, sore throat and nasal congestion for the last 10 days.  She also reports intermittent dyspnea and wheezing.  Denies any recent fever or chills. She has tried DayQuil and Robitussin without much relief.  She is planned to start peritoneal dialysis in the next few weeks.  The patient does not have symptoms concerning for COVID-19 infection (fever, chills, cough, or new shortness of breath).   Past Medical, Surgical, Social History, Allergies, and Medications have been Reviewed.  Past Medical History:  Diagnosis Date   Acquired absence of left leg below knee (HCC)    Anxiety    was on xanax for years but discontinued prior to prior PCP retiring as she was on opioids and was told she would not find someone to write for both   Arterial thromboembolism (HCC)    Prior embolectomy, previously on Coumadin   Cataract    Phreesia 11/19/2019   Chronic kidney disease    Phreesia 11/19/2019   Clotting disorder (HCC)    Phreesia  11/19/2019   Coronary atherosclerosis of native coronary artery    a. s/p DES to LAD in 2014   Depression    Depression    Phreesia 11/19/2019   Diabetes mellitus without complication (HCC)    Phreesia 11/19/2019   Diabetic ketoacidosis with coma associated with type 1 diabetes mellitus (HCC)    DKA, type 1 (HCC) 11/17/2016   Facial cellulitis    12/2010   Glomerulonephritis    Headache(784.0)    Hemiplegia, unspecified affecting right dominant side (HCC)    History of stroke    Hyperlipidemia    Insulin dependent diabetes mellitus    History of diabetic ketoacidosis   Ischemic cardiomyopathy    a. EF 30-35% in 2018 b. EF at 45-50% by repeat imaging in 02/2019   Left carotid artery occlusion    Major depressive disorder, single episode, unspecified    N&V (nausea and vomiting) 06/03/2021   Pancreatitis, acute 11/07/2016   Peripheral arterial disease (HCC)    Shingles    11/2010   ST elevation myocardial infarction (STEMI) of anterior wall [facility])    Late presentation September 2014   Stroke [facility]    2018   Upper abdominal pain 06/03/2021   Past Surgical History:  Procedure Laterality Date   AMPUTATION  03/03/2011   Procedure: AMPUTATION DIGIT;  Surgeon: Nadara Mustard, MD;  Location: [facility] OR;  Service: Orthopedics;  Laterality: Left;  Left Foot Amputation 4th and 5th  toes at MTP joint   AMPUTATION  04/22/2011   Procedure: AMPUTATION BELOW KNEE;  Surgeon: Nadara Mustard, MD;  Location: [facility] OR;  Service: Orthopedics;  Laterality: Left;  Left Below Knee Amputation   BIOPSY  07/17/2021   Procedure: BIOPSY;  Surgeon: Lanelle Bal, DO;  Location: [facility] ENDO SUITE;  Service: Endoscopy;;   CATARACT EXTRACTION W/PHACO Left 01/09/2020   Procedure: CATARACT EXTRACTION PHACO AND INTRAOCULAR LENS PLACEMENT (IOC);  Surgeon: Fabio Pierce, MD;  Location: [facility] ORS;  Service: Ophthalmology;  Laterality: Left;  CDE: 9.73   COLONOSCOPY WITH PROPOFOL N/A 12/14/2021   Procedure: COLONOSCOPY WITH  PROPOFOL;  Surgeon: Lanelle Bal, DO;  Location: [facility] ENDO SUITE;  Service: Endoscopy;  Laterality: N/A;  9:45 am   DILATION AND CURETTAGE OF UTERUS     EMBOLECTOMY  01/29/2011   Procedure: EMBOLECTOMY;  Surgeon: Pryor Ochoa, MD;  Location[facility] OR;  Service: Vascular;  Laterality: Left;  Left Popliteal and Tibial Embolectomy with patch angioplasty   ESOPHAGOGASTRODUODENOSCOPY (EGD) WITH PROPOFOL N/A 07/17/2021   Procedure: ESOPHAGOGASTRODUODENOSCOPY (EGD) WITH PROPOFOL;  Surgeon: Lanelle Bal, DO;  [facility];  Service: Endoscopy;  Laterality: N/A;  1:00pm   INCISION AND DRAINAGE ABSCESS N/A 04/06/2016   Procedure: INCISION AND DRAINAGE ABSCESS;  Surgeon: Tilda Burrow, MD;  Location: [facility] ORS;  Service: Gynecology;  Laterality: N/A;   LEFT HEART CATHETERIZATION WITH CORONARY ANGIOGRAM N/A 10/01/2012   Procedure: LEFT HEART CATHETERIZATION WITH CORONARY ANGIOGRAM;  Surgeon: Peter M Swaziland, MD;  Location: [facility] CATH LAB;  Service: Cardiovascular;  Laterality: N/A;   LOWER EXTREMITY ANGIOGRAM N/A 01/27/2011   Procedure: LOWER EXTREMITY ANGIOGRAM;  Surgeon: Larina Earthly, MD;  Location: [facility] CATH LAB;  Service: Cardiovascular;  Laterality: N/A;   LOWER EXTREMITY ANGIOGRAM N/A 01/28/2011   Procedure: LOWER EXTREMITY ANGIOGRAM;  Surgeon: Fransisco Hertz, MD;  Location: [facility] CATH LAB;  Service: Cardiovascular;  Laterality: N/A;   LOWER EXTREMITY ANGIOGRAM Left 01/29/2011   Procedure: LOWER EXTREMITY ANGIOGRAM;  Surgeon: Pryor Ochoa, MD;  Location: [facility] CATH LAB;  Service: Cardiovascular;  Laterality: Left;   MULTIPLE TOOTH EXTRACTIONS     POLYPECTOMY  12/14/2021   Procedure: POLYPECTOMY;  Surgeon: Lanelle Bal, DO;  [facility];  Service: Endoscopy;;   TEE WITHOUT CARDIOVERSION  04/15/2011   Procedure: TRANSESOPHAGEAL ECHOCARDIOGRAM (TEE);  Surgeon: Kathlen Brunswick, MD;  [facility];  Service: Cardiovascular;  Laterality: N/A;   WRIST SURGERY     Left     Current  Meds  Medication Sig   acetaminophen (TYLENOL) 500 MG tablet Take 500 mg by mouth every 6 (six) hours as needed for moderate pain.   albuterol (VENTOLIN HFA) 108 (90 Base) MCG/ACT inhaler Inhale 2 puffs into the lungs every 6 (six) hours as needed for wheezing or shortness of breath.   atorvastatin (LIPITOR) 80 MG tablet TAKE ONE TABLET (80MG  TOTAL) BY MOUTH DAILY   azithromycin (ZITHROMAX) 250 MG tablet Take 2 tablets on day 1, then 1 tablet daily on days 2 through 5   calcium carbonate (TUMS EX) 750 MG chewable tablet Chew 1 tablet by mouth daily as needed for heartburn.   carvedilol (COREG) 6.25 MG tablet Take 1 tablet (6.25 mg total) by mouth 2 (two) times daily with a meal.   cetirizine (ZYRTEC) 10 MG tablet Take 10 mg by mouth daily as needed for allergies.   Continuous Blood Gluc Sensor (DEXCOM G7 SENSOR) MISC Inject 1 Application  into the skin as directed. Change sensor every 10 days as directed.   ELIQUIS 2.5 MG TABS tablet TAKE ONE TABLET (2.5MG  TOTAL) BY MOUTH TWO TIMES DAILY   glucose blood (ACCU-CHEK GUIDE) test strip Use as instructed to monitor glucose 4 times daily, before meals and before bed.   guaiFENesin-codeine 100-10 MG/5ML syrup Take 5 mLs by mouth 3 (three) times daily as needed for cough.   hydrALAZINE (APRESOLINE) 10 MG tablet Take 1 tablet (10 mg total) by mouth 2 (two) times daily.   insulin aspart (NOVOLOG) 100 UNIT/ML FlexPen Inject 1-7 Units into the skin 3 (three) times daily with meals.   insulin glargine (LANTUS SOLOSTAR) 100 UNIT/ML Solostar Pen Inject 20 Units into the skin at bedtime.   Insulin Pen Needle (UNIFINE PENTIPS) 31G X 8 MM MISC USE TO INJECT INSULIN SUBCUTANEOUSLY . USE FOR BOTH SLIDING SCALE AND FOR DAILYINSULIN DOSES.   linaclotide (LINZESS) 290 MCG CAPS capsule Take 1 capsule (290 mcg total) by mouth daily before breakfast.   metoCLOPramide (REGLAN) 5 MG tablet Take 1 tablet (5 mg total) by mouth every 8 (eight) hours as needed for nausea.    mirtazapine (REMERON) 7.5 MG tablet Take 1 tablet (7.5 mg total) by mouth at bedtime.   Misc. Devices MISC Wheelchair cushion - small.   nitroGLYCERIN (NITROSTAT) 0.4 MG SL tablet PLACE ONE TABLET (0.4MG  TOTAL) UNDER THETONGUE EVERY FIVE MINUTES AS NEEDED FOR CHEST PAIN   ofloxacin (OCUFLOX) 0.3 % ophthalmic solution Place 1 drop into the left eye 4 (four) times daily.   omeprazole (PRILOSEC) 20 MG capsule TAKE ONE (1) CAPSULE BY MOUTH TWICE A DAY. (EVERY 12 HOURS.) BEFORE A MEAL   oxyCODONE-acetaminophen (PERCOCET) 7.5-325 MG tablet Take 1 tablet by mouth every 6 (six) hours as needed for severe pain (pain score 7-10). Up to 5 times daily.   RITUXAN 500 MG/50ML injection Inject 100 mLs (1,000 mg total) into the vein every 14 (fourteen) days. Courier to [facility]. [address] [place] [address] Attn: Serafina Royals. (pharmacy dept)   simethicone (GAS-X) 80 MG chewable tablet Chew 80 mg by mouth every 6 (six) hours as needed for flatulence.     Allergies:   Losartan and Nsaids   ROS:   Please see the history of present illness. All other systems reviewed and are negative.   Labs/Other Tests and Data Reviewed:    Recent Labs: 02/11/2022: ALT 14; Platelets 261 02/18/2022: Potassium 5.5; Sodium 135 08/09/2022: BUN 58; Creatinine 3.1; Hemoglobin 10.9   Recent Lipid Panel Lab Results  Component Value Date/Time   CHOL 151 04/09/2021 12:27 PM   TRIG 75 04/09/2021 12:27 PM   HDL 59 04/09/2021 12:27 PM   CHOLHDL 2.6 04/09/2021 12:27 PM   CHOLHDL 4.2 11/07/2016 07:22 AM   LDLCALC 77 04/09/2021 12:27 PM    Wt Readings from Last 3 Encounters:  02/08/23 110 lb (49.9 kg)  01/06/23 107 lb (48.5 kg)  12/27/22 107 lb (48.5 kg)     Objective:    Vital Signs:  Ht 5\' 1"  (1.549 m)   Wt 110 lb (49.9 kg)   LMP 12/28/2010   BMI 20.78 kg/m    VITAL SIGNS:  reviewed GEN:  no acute distress EYES:  sclerae anicteric, EOMI - Extraocular Movements Intact RESPIRATORY:  Able to speak in full  sentences, patient reported dyspnea. NEURO:  alert and oriented x 3, no obvious focal deficit PSYCH:  normal affect  ASSESSMENT & PLAN:    Acute bronchitis Started  empiric azithromycin considering persistent symptoms despite symptomatic treatment for sinusitis Cheratussin as needed for cough Albuterol as needed for dyspnea or wheezing Avoid oral steroids due to uncontrolled DM   I discussed the assessment and treatment plan with the patient. The patient was provided an opportunity to ask questions, and all were answered. The patient agreed with the plan and demonstrated an understanding of the instructions.   The patient was advised to call back or seek an in-person evaluation if the symptoms worsen or if the condition fails to improve as anticipated.  The above assessment and management plan was discussed with the patient. The patient verbalized understanding of and has agreed to the management plan.   Medication Adjustments/Labs and Tests Ordered: Current medicines are reviewed at length with the patient today.  Concerns regarding medicines are outlined above.   Tests Ordered: No orders of the defined types were placed in this encounter.   Medication Changes: Meds ordered this encounter  Medications   azithromycin (ZITHROMAX) 250 MG tablet    Sig: Take 2 tablets on day 1, then 1 tablet daily on days 2 through 5    Dispense:  6 tablet    Refill:  0   albuterol (VENTOLIN HFA) 108 (90 Base) MCG/ACT inhaler    Sig: Inhale 2 puffs into the lungs every 6 (six) hours as needed for wheezing or shortness of breath.    Dispense:  18 g    Refill:  0    Okay to substitute to generic/formulary Albuterol.   guaiFENesin-codeine 100-10 MG/5ML syrup    Sig: Take 5 mLs by mouth 3 (three) times daily as needed for cough.    Dispense:  120 mL    Refill:  0     Note: This dictation was prepared with Dragon dictation along with smaller phrase technology. Similar sounding words can be  transcribed inadequately or may not be corrected upon review. Any transcriptional errors that result from this process are unintentional.      Disposition:  Follow up  Signed, Anabel Halon, MD  02/08/2023 1:24 PM     [facility]

## 2023-02-18 ENCOUNTER — Encounter: Payer: Self-pay | Admitting: Internal Medicine

## 2023-02-18 ENCOUNTER — Other Ambulatory Visit: Payer: Self-pay | Admitting: Internal Medicine

## 2023-02-18 DIAGNOSIS — J209 Acute bronchitis, unspecified: Secondary | ICD-10-CM

## 2023-02-18 MED ORDER — PRO COMFORT SPACER ADULT MISC: 2 refills | Status: AC

## 2023-02-21 ENCOUNTER — Other Ambulatory Visit: Payer: Self-pay | Admitting: Gastroenterology

## 2023-02-24 ENCOUNTER — Encounter (HOSPITAL_COMMUNITY): Payer: Self-pay

## 2023-02-24 ENCOUNTER — Emergency Department (HOSPITAL_COMMUNITY)
Admission: EM | Admit: 2023-02-24 | Discharge: 2023-02-24 | Disposition: A | Discharge status: Home / Self Care | Payer: Medicaid Other | Attending: Emergency Medicine | Admitting: Emergency Medicine

## 2023-02-24 ENCOUNTER — Other Ambulatory Visit: Payer: Self-pay

## 2023-02-24 DIAGNOSIS — Z20822 Contact with and (suspected) exposure to covid-19: Secondary | ICD-10-CM | POA: Insufficient documentation

## 2023-02-24 DIAGNOSIS — Z7901 Long term (current) use of anticoagulants: Secondary | ICD-10-CM | POA: Diagnosis not present

## 2023-02-24 DIAGNOSIS — J101 Influenza due to other identified influenza virus with other respiratory manifestations: Secondary | ICD-10-CM | POA: Diagnosis not present

## 2023-02-24 DIAGNOSIS — N186 End stage renal disease: Secondary | ICD-10-CM | POA: Diagnosis not present

## 2023-02-24 DIAGNOSIS — Z794 Long term (current) use of insulin: Secondary | ICD-10-CM | POA: Diagnosis not present

## 2023-02-24 DIAGNOSIS — E1122 Type 2 diabetes mellitus with diabetic chronic kidney disease: Secondary | ICD-10-CM | POA: Insufficient documentation

## 2023-02-24 DIAGNOSIS — Z992 Dependence on renal dialysis: Secondary | ICD-10-CM | POA: Insufficient documentation

## 2023-02-24 DIAGNOSIS — R001 Bradycardia, unspecified: Secondary | ICD-10-CM | POA: Diagnosis present

## 2023-02-24 LAB — CBC
HCT: 34.1 % — ABNORMAL LOW (ref 36.0–46.0)
Hemoglobin: 10.9 g/dL — ABNORMAL LOW (ref 12.0–15.0)
MCH: 29.6 pg (ref 26.0–34.0)
MCHC: 32 g/dL (ref 30.0–36.0)
MCV: 92.7 fL (ref 80.0–100.0)
Platelets: 220 10*3/uL (ref 150–400)
RBC: 3.68 MIL/uL — ABNORMAL LOW (ref 3.87–5.11)
RDW: 15.5 % (ref 11.5–15.5)
WBC: 4.1 10*3/uL (ref 4.0–10.5)
nRBC: 0 % (ref 0.0–0.2)

## 2023-02-24 LAB — URINALYSIS, ROUTINE W REFLEX MICROSCOPIC
Bilirubin Urine: NEGATIVE
Glucose, UA: NEGATIVE mg/dL
Hgb urine dipstick: NEGATIVE
Ketones, ur: NEGATIVE mg/dL
Leukocytes,Ua: NEGATIVE
Nitrite: NEGATIVE
Protein, ur: >300 mg/dL — AB
Specific Gravity, Urine: 1.025 (ref 1.005–1.030)
pH: 5.5 (ref 5.0–8.0)

## 2023-02-24 LAB — RESP PANEL BY RT-PCR (RSV, FLU A&B, COVID)  RVPGX2
Influenza A by PCR: POSITIVE — AB
Influenza B by PCR: NEGATIVE
Resp Syncytial Virus by PCR: NEGATIVE
SARS Coronavirus 2 by RT PCR: NEGATIVE

## 2023-02-24 LAB — BASIC METABOLIC PANEL
Anion gap: 14 (ref 5–15)
BUN: 40 mg/dL — ABNORMAL HIGH (ref 6–20)
CO2: 21 mmol/L — ABNORMAL LOW (ref 22–32)
Calcium: 7.8 mg/dL — ABNORMAL LOW (ref 8.9–10.3)
Chloride: 96 mmol/L — ABNORMAL LOW (ref 98–111)
Creatinine, Ser: 4.61 mg/dL — ABNORMAL HIGH (ref 0.44–1.00)
GFR, Estimated: 11 mL/min — ABNORMAL LOW (ref >60)
Glucose, Bld: 135 mg/dL — ABNORMAL HIGH (ref 70–99)
Potassium: 4.2 mmol/L (ref 3.5–5.1)
Sodium: 131 mmol/L — ABNORMAL LOW (ref 135–145)

## 2023-02-24 LAB — URINALYSIS, MICROSCOPIC (REFLEX): Bacteria, UA: NONE SEEN

## 2023-02-24 LAB — MAGNESIUM: Magnesium: 2.2 mg/dL (ref 1.7–2.4)

## 2023-02-24 MED ORDER — ONDANSETRON HCL 4 MG/2ML IJ SOLN
4.0000 mg | Freq: Once | INTRAMUSCULAR | Status: AC
  Administered 2023-02-24: 4 mg via INTRAVENOUS
  Filled 2023-02-24: qty 2

## 2023-02-24 MED ORDER — HYDROMORPHONE HCL 1 MG/ML IJ SOLN
0.5000 mg | Freq: Once | INTRAMUSCULAR | Status: AC
  Administered 2023-02-24: 0.5 mg via INTRAVENOUS
  Filled 2023-02-24: qty 1

## 2023-02-24 NOTE — ED Triage Notes (Signed)
 Pt brought in by her daughter with c/o bradycardia today. She does peritoneal dialysis and monitors her vital signs at home and they noted her HR was 33. The home nurse told them to come to the ER. Pt arrived to ER and the dinamap indicated a HR of 33. This RN palpated her radial pulse and it was in the 30s. Pt placed on zoll pads and taken directly to room 31. EKG obtained and given to Dr. Cottie. Pt in bigeminy, HR 64.  She also reports she thinks she has the flu.

## 2023-02-24 NOTE — ED Notes (Signed)
 Just assumed care of patient. Patient laying in bed asking for something to eat because her dexcom glucose monitor went off saying her BS is 84. We are messaging the doctor to see if patient can have something to eat. Patient is in no noted distress at the present time will continue to monitor for any changes.

## 2023-02-24 NOTE — ED Provider Notes (Signed)
 [facility] Provider Note   CSN: [id number] Arrival date & time: 02/24/23  1622     History  Chief Complaint  Patient presents with   Bradycardia    [name] is a 53 y.o. 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  Patient was recently started on peritoneal dialysis and was scheduled to have it done today was told to come to the ED for bradycardia.  She is complaining of every symptom including sore throat, headache, weakness, nausea, diarrhea, fatigue.  Her daughter says she also has similar symptoms herself and was concerned about possible flu.  The patient does produce a little bit of urine, is a history of UTIs but not having active dysuria  HPI     Home Medications Prior to Admission medications   Medication Sig Start Date End Date Taking? Authorizing Provider  acetaminophen  (TYLENOL ) 500 MG tablet Take 500 mg by mouth every 6 (six) hours as needed for moderate pain.    [provider]  albuterol  (VENTOLIN  HFA) 108 (90 Base) MCG/ACT inhaler Inhale 2 puffs into the lungs every 6 (six) hours as needed for wheezing or shortness of breath. 02/08/23   Tobie Suzzane POUR, MD  atorvastatin  (LIPITOR ) 80 MG tablet TAKE ONE TABLET (80MG  TOTAL) BY MOUTH DAILY 01/20/23   Debera Jayson MATSU, MD  calcium  carbonate (TUMS EX) 750 MG chewable tablet Chew 1 tablet by mouth daily as needed for heartburn.    [provider]  carvedilol  (COREG ) 6.25 MG tablet Take 1 tablet (6.25 mg total) by mouth 2 (two) times daily with a meal. 01/06/23   Tobie, Suzzane POUR, MD  cetirizine (ZYRTEC) 10 MG tablet Take 10 mg by mouth daily as needed for allergies.    [provider]  Continuous Blood Gluc  Sensor (DEXCOM G7 SENSOR) MISC Inject 1 Application into the skin as directed. Change sensor every 10 days as directed. 03/29/22   Therisa Benton PARAS, NP  ELIQUIS  2.5 MG TABS tablet TAKE ONE TABLET (2.5MG  TOTAL) BY MOUTH TWO TIMES DAILY 01/20/23   Debera Jayson MATSU, MD  glucose blood (ACCU-CHEK GUIDE) test strip Use as instructed to monitor glucose 4 times daily, before meals and before bed. 12/02/21   Therisa Benton PARAS, NP  guaiFENesin -codeine  100-10 MG/5ML syrup Take 5 mLs by mouth 3 (three) times daily as needed for cough. 02/08/23   Tobie Suzzane POUR, MD  hydrALAZINE  (APRESOLINE ) 10 MG tablet Take 1 tablet (10 mg total) by mouth 2 (two) times daily. 01/06/23   Tobie Suzzane POUR, MD  insulin  aspart (NOVOLOG ) 100 UNIT/ML FlexPen Inject 1-7 Units into the skin 3 (three) times daily with meals. 08/23/22   Therisa Benton PARAS, NP  insulin  glargine (LANTUS  SOLOSTAR) 100 UNIT/ML Solostar Pen Inject 20 Units into the skin at bedtime. 01/13/23   Therisa Benton PARAS, NP  Insulin  Pen Needle (UNIFINE PENTIPS) 31G X 8 MM MISC USE TO INJECT INSULIN  SUBCUTANEOUSLY . USE FOR BOTH SLIDING SCALE AND FOR DAILYINSULIN DOSES. 08/23/22   Therisa Benton PARAS, NP  linaclotide  (LINZESS ) 290 MCG CAPS capsule Take 1 capsule (290 mcg total) by mouth daily before breakfast. 02/11/22   Shirlean Therisa ORN, NP  metoCLOPramide  (REGLAN ) 5 MG tablet Take 1 tablet (5 mg total) by  mouth every 8 (eight) hours as needed for nausea. 01/06/23   Tobie Suzzane POUR, MD  mirtazapine  (REMERON ) 7.5 MG tablet Take 1 tablet (7.5 mg total) by mouth at bedtime. 01/06/23   Tobie Suzzane POUR, MD  Misc. Devices MISC Wheelchair cushion - small. 12/31/21   Tobie Suzzane POUR, MD  nitroGLYCERIN  (NITROSTAT ) 0.4 MG SL tablet PLACE ONE TABLET (0.4MG  TOTAL) UNDER THETONGUE EVERY FIVE MINUTES AS NEEDED FOR CHEST PAIN 11/11/22   Debera Jayson MATSU, MD  ofloxacin  (OCUFLOX ) 0.3 % ophthalmic solution Place 1 drop into the left eye 4 (four) times daily. 01/06/23   Tobie Suzzane POUR, MD   omeprazole  (PRILOSEC) 20 MG capsule TAKE ONE (1) CAPSULE BY MOUTH TWICE A DAY. (EVERY 12 HOURS.) BEFORE A MEAL 10/12/22   Shirlean Therisa ORN, NP  oxyCODONE -acetaminophen  (PERCOCET) 7.5-325 MG tablet Take 1 tablet by mouth every 6 (six) hours as needed for severe pain (pain score 7-10). Up to 5 times daily.    [provider]  RITUXAN  500 MG/50ML injection Inject 100 mLs (1,000 mg total) into the vein every 14 (fourteen) days. Courier to [facility]. [address] [place] [address] Attn: Inocente Livers. (pharmacy dept) 03/11/22   Rachele Gaynell RAMAN, MD  simethicone (GAS-X) 80 MG chewable tablet Chew 80 mg by mouth every 6 (six) hours as needed for flatulence.    [provider]  Spacer/Aero-Holding Chambers (PRO COMFORT SPACER ADULT) MISC Use it as directed for Albuterol  inhaler. Okay to dispense generic alternative preferred by insurance. 02/18/23   Tobie Suzzane POUR, MD      Allergies    Losartan  and Nsaids    Review of Systems   Review of Systems  Physical Exam Updated Vital Signs BP 123/60   Pulse 64   Temp 98.2 F (36.8 C) (Oral)   Resp 18   Ht 5' 1 (1.549 m)   Wt 49.9 kg   LMP 12/28/2010   SpO2 96%   BMI 20.78 kg/m  Physical Exam Constitutional:      General: She is not in acute distress. HENT:     Head: Normocephalic and atraumatic.  Eyes:     Conjunctiva/sclera: Conjunctivae normal.     Pupils: Pupils are equal, round, and reactive to light.  Cardiovascular:     Rate and Rhythm: Normal rate and regular rhythm.  Pulmonary:     Effort: Pulmonary effort is normal. No respiratory distress.  Abdominal:     General: There is no distension.     Tenderness: There is no abdominal tenderness.     Comments: Peritoneal dialysis is her site appears clean and intact  Musculoskeletal:     Comments: Left of the knee amputation  Skin:    General: Skin is warm and dry.  Neurological:     General: No focal deficit present.     Mental Status: She is alert. Mental status  is at baseline.  Psychiatric:        Mood and Affect: Mood normal.        Behavior: Behavior normal.     ED Results / Procedures / Treatments   Labs (all labs ordered are listed, but only abnormal results are displayed) Labs Reviewed  RESP PANEL BY RT-PCR (RSV, FLU A&B, COVID)  RVPGX2 - Abnormal; Notable for the following components:      Result Value   Influenza A by PCR POSITIVE (*)    All other components within normal limits  BASIC METABOLIC PANEL - Abnormal; Notable for the  following components:   Sodium 131 (*)    Chloride 96 (*)    CO2 21 (*)    Glucose, Bld 135 (*)    BUN 40 (*)    Creatinine, Ser 4.61 (*)    Calcium  7.8 (*)    GFR, Estimated 11 (*)    All other components within normal limits  CBC - Abnormal; Notable for the following components:   RBC 3.68 (*)    Hemoglobin 10.9 (*)    HCT 34.1 (*)    All other components within normal limits  URINALYSIS, ROUTINE W REFLEX MICROSCOPIC - Abnormal; Notable for the following components:   Protein, ur >300 (*)    All other components within normal limits  MAGNESIUM   URINALYSIS, MICROSCOPIC (REFLEX)    EKG EKG Interpretation Date/Time:  Thursday February 24 2023 16:40:16 EST Ventricular Rate:  84 PR Interval:  161 QRS Duration:  90 QT Interval:  369 QTC Calculation: 320 R Axis:   -47  Text Interpretation: Sinus rhythm Ventricular bigeminy Left anterior fascicular block Confirmed by Cottie Cough [phone]) on 02/24/2023 4:59:13 PM  Radiology No results found.  Procedures Procedures    Medications Ordered in ED Medications  HYDROmorphone  (DILAUDID ) injection 0.5 mg (0.5 mg Intravenous Given 02/24/23 1722)  ondansetron  (ZOFRAN ) injection 4 mg (4 mg Intravenous Given 02/24/23 1722)    ED Course/ Medical Decision Making/ A&P Clinical Course as of 02/25/23 0849  Thu Feb 24, 2023  1854 HR 64 bpm, not 32 bpm as listed in vitals, pt in bigemeny [MT]  1916 Influenza A By PCR(!): POSITIVE [MT]  2019 Tolerating  fluids easily.  Updated daughter and pt about influenza [MT]    Clinical Course User Index [MT] Kabao Leite, Cough PARAS, MD                                 Medical Decision Making Amount and/or Complexity of Data Reviewed Labs: ordered. Decision-making details documented in ED Course.  Risk Prescription drug management.   This patient presents to the ED with concern for 'bradycardia' and flu like symptoms. This involves an extensive number of treatment options, and is a complaint that carries with it a high risk of complications and morbidity.  The differential diagnosis includes arrhythmia vs metabolic derangement vs other  Co-morbidities that complicate the patient evaluation: CV risk factors, diabetes; dialysis  Additional history obtained from daughter    I ordered and personally interpreted labs.  The pertinent results include:  Influenza positive.  Remaining labs stable/at baseline.  UA without evidence of infection  The patient was maintained on a cardiac monitor.  I personally viewed and interpreted the cardiac monitored which showed an underlying rhythm of: ventricular bigemeny, V-rate approx 60   Per my interpretation the patient's ECG shows sinus rhythm with ventricular bigemeny, V rate in 60's, no acute ischemic findings  I ordered medication including IV dilaudid  for chronic pain, zofran  for nausea  I have reviewed the patients home medicines and have made adjustments as needed  Test Considered: doubt CVA, sepsis, acute PE.  No acute abdomen on exam.  Xray and CT imaging not indicated at this time.  After the interventions noted above, I reevaluated the patient and found that they have: improved   Dispostion:  After consideration of the diagnostic results and the patients response to treatment, I feel that the patent would benefit from outpatient follow up.  They can continue home dialysis -  daughter is trained to do this.  No indication for tamiflu now on day 4 of  flu symptoms.    I stressed importance of staying hydrated with water  at home.         Final Clinical Impression(s) / ED Diagnoses Final diagnoses:  Influenza A    Rx / DC Orders ED Discharge Orders     None         Cottie Donnice PARAS, MD 02/25/23 [phone]

## 2023-03-01 ENCOUNTER — Encounter: Payer: Self-pay | Admitting: Cardiology

## 2023-03-01 ENCOUNTER — Ambulatory Visit: Payer: Medicaid Other | Attending: Cardiology | Admitting: Cardiology

## 2023-03-01 VITALS — BP 110/42 | HR 51 | Ht 61.0 in | Wt 110.0 lb

## 2023-03-01 DIAGNOSIS — N185 Chronic kidney disease, stage 5: Secondary | ICD-10-CM | POA: Insufficient documentation

## 2023-03-01 DIAGNOSIS — I25119 Atherosclerotic heart disease of native coronary artery with unspecified angina pectoris: Secondary | ICD-10-CM | POA: Diagnosis present

## 2023-03-01 DIAGNOSIS — I5032 Chronic diastolic (congestive) heart failure: Secondary | ICD-10-CM | POA: Diagnosis present

## 2023-03-01 NOTE — Progress Notes (Signed)
Cardiology Office Note  Date: 03/01/2023   ID: [name], DOB [birth year], MRN [id number]  History of Present Illness: [name] is a 53 y.o. female last seen in December 2024.  She is here today with her daughter for a follow-up visit.  She did undergo placement of peritoneal dialysis catheter in January, has been undergoing treatments at home and has follow-up with Dr. Wolfgang Phoenix in the near future.  From a cardiac perspective she does not report any obvious angina with low-level activity.  Interval echocardiogram showed normalization of LVEF at 55 to 60%.  I reviewed her current medications.  Present cardiac regimen includes Lipitor, Coreg, Eliquis, hydralazine, and as needed nitroglycerin.  She has had ventricular bigeminy documented by ECG, not clearly symptomatic.  Physical Exam: VS:  BP (!) 110/42 (Patient Position: Sitting)   Pulse (!) 51   Ht 5\' 1"  (1.549 m)   Wt 110 lb (49.9 kg)   LMP 12/28/2010   SpO2 98%   BMI 20.78 kg/m , BMI Body mass index is 20.78 kg/m.  Wt Readings from Last 3 Encounters:  03/01/23 110 lb (49.9 kg)  02/24/23 110 lb (49.9 kg)  02/08/23 110 lb (49.9 kg)    General: Patient appears comfortable at rest. HEENT: Conjunctiva and lids normal. Neck: Supple, no elevated JVP, left carotid bruit. Lungs: Decreased breath sounds with prolonged expiratory phase Cardiac: Regular rate and rhythm with ectopy, no S3, 1/6 systolic murmur. 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  ECG:  An ECG dated 02/24/2023 was personally reviewed today and demonstrated:  Sinus rhythm with ventricular bigeminy, left anterior fascicular block.  Labwork: 02/24/2023: BUN 40; Creatinine, Ser 4.61; Hemoglobin 10.9; Magnesium 2.2; Platelets 220; Potassium 4.2; Sodium 131     Component Value Date/Time   CHOL 151 04/09/2021 1227   TRIG 75 04/09/2021 1227   HDL 59 04/09/2021 1227   CHOLHDL 2.6 04/09/2021 1227   CHOLHDL 4.2 11/07/2016 0722   VLDL 30  11/07/2016 0722   LDLCALC 77 04/09/2021 1227   Other Studies Reviewed Today:  Echocardiogram 01/13/2023:  1. Left ventricular ejection fraction, by estimation, is 55 to 60%. The  left ventricle has normal function. The left ventricle demonstrates  regional wall motion abnormalities (see scoring diagram/findings for  description). There is moderate asymmetric  left ventricular hypertrophy. Left ventricular diastolic parameters are  consistent with Grade I diastolic dysfunction (impaired relaxation).   2. Right ventricular systolic function is normal. The right ventricular  size is normal. Tricuspid regurgitation signal is inadequate for assessing  PA pressure.   3. The mitral valve is degenerative. Trivial mitral valve regurgitation.  Moderate mitral annular calcification.   4. The aortic valve is tricuspid. Aortic valve regurgitation is not  visualized.   5. The inferior vena cava is normal in size with greater than 50%  respiratory variability, suggesting right atrial pressure of 3 mmHg.   Assessment and Plan:  1.  HFrecEF with ischemic cardiomyopathy, LVEF improved to the range of 55 to 60% by echocardiogram in December 2024, RV contraction also normal.  GDMT limited by CKD stage V and ARB allergy.  Blood pressure and weight stable.  Plan to continue Coreg and hydralazine.   2.  CAD status post DES to the LAD in 2014.  No active angina with low-level activity.  She is on Lipitor and has as needed nitroglycerin available.   3.  CKD stage V, recent creatinine 4.61 with potassium 4.2.  She has biopsy-proven membranous  glomerulopathy and follows with Dr. Wolfgang Phoenix.  Status post peritoneal dialysis catheter placement, undergoing treatments at home with follow-up planned in the near future.   4.  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  She is on chronic anticoagulation with Eliquis.  Disposition:  Follow up  6 months.  Signed, Jonelle Sidle, M.D.,  F.A.C.C. [facility]

## 2023-03-01 NOTE — Patient Instructions (Addendum)

## 2023-03-03 ENCOUNTER — Ambulatory Visit (HOSPITAL_COMMUNITY)
Admission: RE | Admit: 2023-03-03 | Discharge: 2023-03-03 | Disposition: A | Discharge status: Home / Self Care | Payer: Medicaid Other | Source: Ambulatory Visit | Attending: Nurse Practitioner | Admitting: Nurse Practitioner

## 2023-03-03 ENCOUNTER — Other Ambulatory Visit (HOSPITAL_COMMUNITY): Payer: Self-pay | Admitting: Nurse Practitioner

## 2023-03-03 DIAGNOSIS — J189 Pneumonia, unspecified organism: Secondary | ICD-10-CM | POA: Insufficient documentation

## 2023-03-15 ENCOUNTER — Encounter: Payer: Self-pay | Admitting: Internal Medicine

## 2023-03-21 ENCOUNTER — Ambulatory Visit: Payer: Self-pay | Admitting: Internal Medicine

## 2023-03-23 ENCOUNTER — Ambulatory Visit: Payer: Medicaid Other | Admitting: Nurse Practitioner

## 2023-03-23 ENCOUNTER — Ambulatory Visit (INDEPENDENT_AMBULATORY_CARE_PROVIDER_SITE_OTHER): Payer: Self-pay | Admitting: Family Medicine

## 2023-03-23 ENCOUNTER — Encounter: Payer: Self-pay | Admitting: Family Medicine

## 2023-03-23 VITALS — BP 97/62 | HR 79 | Resp 16 | Ht 61.0 in | Wt 98.0 lb

## 2023-03-23 DIAGNOSIS — L89151 Pressure ulcer of sacral region, stage 1: Secondary | ICD-10-CM

## 2023-03-23 DIAGNOSIS — E1059 Type 1 diabetes mellitus with other circulatory complications: Secondary | ICD-10-CM | POA: Diagnosis not present

## 2023-03-23 NOTE — Assessment & Plan Note (Addendum)
 Turn , relieve pressure on area every 90 to 180 minutes Tegaderm dressing every 2 to 3 days H/H referral till helaed, wound care , twice weekly x 4 weeks

## 2023-03-23 NOTE — Patient Instructions (Signed)
 F/u in 4 to 5 weeks with PCP re evaluate ulcer , call if you need to be seen sooner  You are referred for wound nurse with home health  to evaluate and manage ulcer twice weekly for up to 6 weeks  Good that you are getting help with this when it is small and not infected  Keep area clean and dry, clear dressing like Tegaderm is better option  so you can see the ulcer  Try to keep pressure off the area as much as possible by positioning yourself in protective ways and turning often  Thanks for choosing [facility], we consider it a privelige to serve you.

## 2023-03-23 NOTE — Progress Notes (Signed)
 [name]     MRN: [id number]      DOB: 1970/11/11  Chief Complaint  Patient presents with   pressure wound    Has a pressure wound on her sacrum. Hasn't had it evaluated yet but the RN at dialysis looked at it [place] and said she needed to have it checked and it was a stage 2 now    HPI [name] is here with a 1 week h/o ulcer on low back/ sacdral area, dime sized, no drainage, no fever or chills Recently treated for pneumonia and influenza Recently started dialysis Very emotional and tearful, supported/ cared for by her daughter ROS Denies recent fever or chills. Denies sinus pressure, nasal congestion, ear pain or sore throat. Denies chest congestion, productive cough or wheezing. Denies chest pains, palpitations and leg swelling Denies abdominal pain, nausea, vomiting,diarrhea or constipation.   PE  BP 97/62   Pulse 79   Resp 16   Ht 5\' 1"  (1.549 m)   Wt 98 lb (44.5 kg)   LMP 12/28/2010   SpO2 97%   BMI 18.52 kg/m   Patient alert and oriented and in no cardiopulmonary distress.Anxious , emotionaly labile  HEENT: No facial asymmetry, EOMI,     Neck supple .  Chest: Clear to auscultation bilaterally.  CVS: S1, S2 no murmurs, no S3.Regular rate.  ABD: Soft non tender.   Ext: No edema  WU:[id number]  ROM spine, shoulders, hips and knees.amputee  Skin: stage 1 sacral ulce no eruythema or drainage, max diameter approx 2.5 cm Psych: Good eye contact, normal affect. Memory intact n CNS: CN 2-12 intact, power,  normal throughout.no focal deficits noted.   Assessment & Plan  Decubitus ulcer of sacral region, stage 1 Turn , relieve pressure on area every 90 to 180 minutes Tegaderm dressing every 2 to 3 days H/H referral till helaed, wound care , twice weekly x 4 weeks  Diabetes mellitus type I (HCC) Diabetes associated with hyperlipidemia and vascular disease  [name] is reminded of the importance of commitment to daily physical activity for 30 minutes  or more, as able and the need to limit carbohydrate intake to 30 to 60 grams per meal to help with blood sugar control.   The need to take medication as prescribed, test blood sugar as directed, and to call between visits if there is a concern that blood sugar is uncontrolled is also discussed.   [name] is reminded of the importance of daily foot exam, annual eye examination, and good blood sugar, blood pressure and cholesterol control. Uncontrolled, at increased risk of complicationms from ulcer , aggressive care with h/h nurse to reduce development of complications     Latest Ref Rng & Units 02/24/2023    4:51 PM 11/23/2022    3:06 PM 08/09/2022   12:00 AM 03/29/2022    2:26 PM 02/18/2022    2:51 PM  Diabetic Labs  HbA1c 4.0 - 5.6 %  9.1  8.4     10.3    Micro/Creat Ratio    8,906        Creatinine 0.44 - 1.00 mg/dL 2.95   3.1      6.21      This result is from an external source.      03/23/2023    2:09 PM 03/01/2023    3:35 PM 02/24/2023    8:15 PM 02/24/2023    7:30 PM 02/24/2023    7:15 PM 02/24/2023    6:45  PM 02/24/2023    6:30 PM  BP/Weight  Systolic BP 97 110 123 132 123 123 122  Diastolic BP 62 42 60 49 48 47 49  Wt. (Lbs) 98 110       BMI 18.52 kg/m2 20.78 kg/m2           03/29/2022    2:00 PM  Foot/eye exam completion dates  Foot Form Completion Done

## 2023-03-28 ENCOUNTER — Encounter: Payer: Self-pay | Admitting: Family Medicine

## 2023-03-28 NOTE — Assessment & Plan Note (Signed)
 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 is reminded of the importance of commitment to daily physical activity for 30 minutes or more, as able and the need to limit carbohydrate intake to 30 to 60 grams per meal to help with blood sugar control.   The need to take medication as prescribed, test blood sugar as directed, and to call between visits if there is a concern that blood sugar is uncontrolled is also discussed.   [name] is reminded of the importance of daily foot exam, annual eye examination, and good blood sugar, blood pressure and cholesterol control. Uncontrolled, at increased risk of complicationms from ulcer , aggressive care with h/h nurse to reduce development of complications     Latest Ref Rng & Units 02/24/2023    4:51 PM 11/23/2022    3:06 PM 08/09/2022   12:00 AM 03/29/2022    2:26 PM 02/18/2022    2:51 PM  Diabetic Labs  HbA1c 4.0 - 5.6 %  9.1  8.4     10.3    Micro/Creat Ratio    8,906        Creatinine 0.44 - 1.00 mg/dL 2.13   3.1      0.86      This result is from an external source.      03/23/2023    2:09 PM 03/01/2023    3:35 PM 02/24/2023    8:15 PM 02/24/2023    7:30 PM 02/24/2023    7:15 PM 02/24/2023    6:45 PM 02/24/2023    6:30 PM  BP/Weight  Systolic BP 97 110 123 132 123 123 122  Diastolic BP 62 42 60 49 48 47 49  Wt. (Lbs) 98 110       BMI 18.52 kg/m2 20.78 kg/m2           03/29/2022    2:00 PM  Foot/eye exam completion dates  Foot Form Completion Done

## 2023-03-31 ENCOUNTER — Encounter: Payer: Self-pay | Admitting: Nurse Practitioner

## 2023-03-31 ENCOUNTER — Ambulatory Visit (INDEPENDENT_AMBULATORY_CARE_PROVIDER_SITE_OTHER): Admitting: Nurse Practitioner

## 2023-03-31 VITALS — BP 110/76 | HR 80 | Ht 61.0 in | Wt 105.2 lb

## 2023-03-31 DIAGNOSIS — E1059 Type 1 diabetes mellitus with other circulatory complications: Secondary | ICD-10-CM | POA: Diagnosis not present

## 2023-03-31 DIAGNOSIS — E559 Vitamin D deficiency, unspecified: Secondary | ICD-10-CM

## 2023-03-31 DIAGNOSIS — E782 Mixed hyperlipidemia: Secondary | ICD-10-CM | POA: Diagnosis not present

## 2023-03-31 DIAGNOSIS — I10 Essential (primary) hypertension: Secondary | ICD-10-CM | POA: Diagnosis not present

## 2023-03-31 DIAGNOSIS — Z794 Long term (current) use of insulin: Secondary | ICD-10-CM

## 2023-03-31 LAB — POCT GLYCOSYLATED HEMOGLOBIN (HGB A1C): Hemoglobin A1C: 8.5 % — AB (ref 4.0–5.6)

## 2023-03-31 NOTE — Progress Notes (Signed)
 Endocrinology Follow Up Note       03/31/2023, 11:49 AM   Subjective:    Patient ID: [name], female    DOB: 19-Sep-1970.  [name] is being seen in follow up after being seen in consultation for management of currently uncontrolled symptomatic diabetes requested by  Anabel Halon, MD.   Past Medical History:  Diagnosis Date   Acquired absence of left leg below knee (HCC)    Anxiety    was on xanax for years but discontinued prior to prior PCP retiring as she was on opioids and was told she would not find someone to write for both   Arterial thromboembolism (HCC)    Prior embolectomy, previously on Coumadin   Cataract    Phreesia 11/19/2019   Chronic kidney disease    Phreesia 11/19/2019   Clotting disorder (HCC)    Phreesia 11/19/2019   Coronary atherosclerosis of native coronary artery    a. s/p DES to LAD in 2014   Depression    Depression    Phreesia 11/19/2019   Diabetes mellitus without complication (HCC)    Phreesia 11/19/2019   Diabetic ketoacidosis with coma associated with type 1 diabetes mellitus (HCC)    DKA, type 1 (HCC) 11/17/2016   Facial cellulitis    12/2010   Glomerulonephritis    Headache(784.0)    Hemiplegia, unspecified affecting right dominant side (HCC)    History of stroke    Hyperlipidemia    Insulin dependent diabetes mellitus    History of diabetic ketoacidosis   Ischemic cardiomyopathy    a. EF 30-35% in 2018 b. EF at 45-50% by repeat imaging in 02/2019   Left carotid artery occlusion    Major depressive disorder, single episode, unspecified    N&V (nausea and vomiting) 06/03/2021   Pancreatitis, acute 11/07/2016   Peripheral arterial disease (HCC)    Shingles    11/2010   ST elevation myocardial infarction (STEMI) of anterior wall [facility])    Late presentation September 2014   Stroke [facility])    2018   Upper abdominal pain 06/03/2021    Past Surgical  History:  Procedure Laterality Date   AMPUTATION  03/03/2011   Procedure: AMPUTATION DIGIT;  Surgeon: Nadara Mustard, MD;  Location: [facility] OR;  Service: Orthopedics;  Laterality: Left;  Left Foot Amputation 4th and 5th toes at MTP joint   AMPUTATION  04/22/2011   Procedure: AMPUTATION BELOW KNEE;  Surgeon: Nadara Mustard, MD;  Location: [facility] OR;  Service: Orthopedics;  Laterality: Left;  Left Below Knee Amputation   BIOPSY  07/17/2021   Procedure: BIOPSY;  Surgeon: Lanelle Bal, DO;  Location: [facility] ENDO SUITE;  Service: Endoscopy;;   CATARACT EXTRACTION W/PHACO Left 01/09/2020   Procedure: CATARACT EXTRACTION PHACO AND INTRAOCULAR LENS PLACEMENT (IOC);  Surgeon: Fabio Pierce, MD;  Location: [facility] ORS;  Service: Ophthalmology;  Laterality: Left;  CDE: 9.73   COLONOSCOPY WITH PROPOFOL N/A 12/14/2021   Procedure: COLONOSCOPY WITH PROPOFOL;  Surgeon: Lanelle Bal, DO;  Location: [facility] ENDO SUITE;  Service: Endoscopy;  Laterality: N/A;  9:45 am   DILATION AND CURETTAGE OF UTERUS     EMBOLECTOMY  01/29/2011   Procedure: EMBOLECTOMY;  Surgeon: Pryor Ochoa, MD;  Location: [facility] OR;  Service: Vascular;  Laterality: Left;  Left Popliteal and Tibial Embolectomy with patch angioplasty   ESOPHAGOGASTRODUODENOSCOPY (EGD) WITH PROPOFOL N/A 07/17/2021   Procedure: ESOPHAGOGASTRODUODENOSCOPY (EGD) WITH PROPOFOL;  Surgeon: Lanelle Bal, DO;  Location: [facility] END[facility] SUITE;  Service: Endoscopy;  Laterality: N/A;  1:00pm   INCISION AND DRAINAGE ABSCESS N/A 04/06/2016   Procedure: INCISION AND DRAINAGE ABSCESS;  Surgeon: Tilda Burrow, MD;  Location: [facility] ORS;  Service: Gynecology;  Laterality: N/A;   LEFT HEART CATHETERIZATION WITH CORONARY ANGIOGRAM N/A 10/01/2012   Procedure: LEFT HEART CATHETERIZATION WITH CORONARY ANGIOGRAM;  Surgeon: Peter M Swaziland, MD;  Location: [facility] CATH LAB;  Service: Cardiovascular;  Laterality: N/A;   LOWER EXTREMITY ANGIOGRAM N/A 01/27/2011   Procedure: LOWER EXTREMITY ANGIOGRAM;  Surgeon: Larina Earthly, MD;  Location: [facility] CATH LAB;  Service: Cardiovascular;  Laterality: N/A;   LOWER EXTREMITY ANGIOGRAM N/A 01/28/2011   Procedure: LOWER EXTREMITY ANGIOGRAM;  Surgeon: Fransisco Hertz, MD;  Location: [facility] CATH LAB;  Service: Cardiovascular;  Laterality: N/A;   LOWER EXTREMITY ANGIOGRAM Left 01/29/2011   Procedure: LOWER EXTREMITY ANGIOGRAM;  Surgeon: Pryor Ochoa, MD;  Location: [facility] CATH LAB;  Service: Cardiovascular;  Laterality: Left;   MULTIPLE TOOTH EXTRACTIONS     POLYPECTOMY  12/14/2021   Procedure: POLYPECTOMY;  Surgeon: Lanelle Bal, DO;  [facility];  Service: Endoscopy;;   TEE WITHOUT CARDIOVERSION  04/15/2011   Procedure: TRANSESOPHAGEAL ECHOCARDIOGRAM (TEE);  Surgeon: Kathlen Brunswick, MD;  Location[facility];  Service: Cardiovascular;  Laterality: N/A;   WRIST SURGERY     Left    Social History   Socioeconomic History   Marital status: Single    Spouse name: Not on file   Number of children: 1   Years of education: Not on file   Highest education level: Not on file  Occupational History   Not on file  Tobacco Use   Smoking status: Every Day    Current packs/day: 0.50    Average packs/day: 0.5 packs/day for 33.7 years (16.9 ttl pk-yrs)    Types: Cigarettes    Start date: 07/06/1989   Smokeless tobacco: Never  Vaping Use   Vaping status: Never Used  Substance and Sexual Activity   Alcohol use: No    Alcohol/week: 0.0 standard drinks of alcohol    Comment: rarely   Drug use: Not Currently    Frequency: 1.0 times per week    Types: Marijuana   Sexual activity: Not Currently    Partners: Male    Comment: pt stated that her tubal was only 50% effective- "not cut and burned"  Other Topics Concern   Not on file  Social History Narrative   Unemployed.  Lives in [place] with [place], [place], and mother.  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   Social Drivers of Corporate investment banker Strain: Low Risk  (02/09/2023)   Received from  [facility]   Overall Financial Resource Strain (CARDIA)    Difficulty of Paying Living Expenses: Not hard at all  Food Insecurity: No Food Insecurity (02/09/2023)   Received from [facility]   Hunger Vital Sign    Worried About Running Out of Food in the Last Year: Never true    Ran Out of Food in the Last Year: Never true  Transportation Needs: No Transportation Needs (02/09/2023)   Received from [facility] - Transportation  Lack of Transportation (Medical): No    Lack of Transportation (Non-Medical): No  Physical Activity: Unknown (12/23/2022)   Received from [facility]   Exercise Vital Sign    Days of Exercise per Week: 0 days    Minutes of Exercise per Session: Not on file  Recent Concern: Physical Activity - Inactive (11/11/2022)   Exercise Vital Sign    Days of Exercise per Week: 0 days    Minutes of Exercise per Session: 0 min  Stress: No Stress Concern Present (01/28/2023)   Received from [facility]ational [facility] - Occupational Stress Questionnaire    Feeling of Stress : Not at all  Recent Concern: Stress - Stress Concern Present (12/23/2022)   Received from [facility] of Occupational Health - Occupational Stress Questionnaire    Feeling of Stress : To some extent  Social Connections: Moderately Integrated (12/23/2022)   Received from [facility]   Social Network    How would you rate your social network (family, work, friends)?: Adequate participation with social networks  Recent Concern: Social Connections - Socially Isolated (11/11/2022)   Social Connection and Isolation Panel [NHANES]    Frequency of Communication with Friends and Family: Once a week    Frequency of Social Gatherings with Friends and Family: Once a week    Attends Religious Services: Never    Database administrator or Organizations: No    Attends Engineer, structural: Never    Marital Status: Never married     Family History  Problem Relation Age of Onset   COPD Mother    Hypertension Mother    Diabetes Mother    Stroke Mother    Coronary artery disease Mother    Diabetes Father    Hypertension Father    Coronary artery disease Father    Kidney disease Father        on dialysis, died from heart attack while being dialzed   Pancreatitis Maternal Aunt    Cancer Maternal Aunt        unknown primary   Anesthesia problems Neg Hx    Colon cancer Neg Hx     Outpatient Encounter Medications as of 03/31/2023  Medication Sig   acetaminophen (TYLENOL) 500 MG tablet Take 500 mg by mouth every 6 (six) hours as needed for moderate pain.   albuterol (VENTOLIN HFA) 108 (90 Base) MCG/ACT inhaler Inhale 2 puffs into the lungs every 6 (six) hours as needed for wheezing or shortness of breath.   atorvastatin (LIPITOR) 80 MG tablet TAKE ONE TABLET (80MG  TOTAL) BY MOUTH DAILY   calcium carbonate (TUMS EX) 750 MG chewable tablet Chew 1 tablet by mouth daily as needed for heartburn.   carvedilol (COREG) 6.25 MG tablet Take 1 tablet (6.25 mg total) by mouth 2 (two) times daily with a meal.   cetirizine (ZYRTEC) 10 MG tablet Take 10 mg by mouth daily as needed for allergies.   Continuous Blood Gluc Sensor (DEXCOM G7 SENSOR) MISC Inject 1 Application into the skin as directed. Change sensor every 10 days as directed.   ELIQUIS 2.5 MG TABS tablet TAKE ONE TABLET (2.5MG  TOTAL) BY MOUTH TWO TIMES DAILY   glucose blood (ACCU-CHEK GUIDE) test strip Use as instructed to monitor glucose 4 times daily, before meals and before bed.   guaiFENesin-codeine 100-10 MG/5ML syrup Take 5 mLs by mouth 3 (three) times daily as needed for cough.   hydrALAZINE (APRESOLINE) 10 MG tablet  Take 1 tablet (10 mg total) by mouth 2 (two) times daily.   insulin aspart (NOVOLOG) 100 UNIT/ML FlexPen Inject 1-7 Units into the skin 3 (three) times daily with meals.   insulin glargine (LANTUS SOLOSTAR) 100 UNIT/ML Solostar Pen Inject 20 Units  into the skin at bedtime.   Insulin Pen Needle (UNIFINE PENTIPS) 31G X 8 MM MISC USE TO INJECT INSULIN SUBCUTANEOUSLY . USE FOR BOTH SLIDING SCALE AND FOR DAILYINSULIN DOSES.   linaclotide (LINZESS) 290 MCG CAPS capsule Take 1 capsule (290 mcg total) by mouth daily before breakfast.   metoCLOPramide (REGLAN) 5 MG tablet Take 1 tablet (5 mg total) by mouth every 8 (eight) hours as needed for nausea.   mirtazapine (REMERON) 7.5 MG tablet Take 1 tablet (7.5 mg total) by mouth at bedtime.   Misc. Devices MISC Wheelchair cushion - small.   nitroGLYCERIN (NITROSTAT) 0.4 MG SL tablet PLACE ONE TABLET (0.4MG  TOTAL) UNDER THETONGUE EVERY FIVE MINUTES AS NEEDED FOR CHEST PAIN   ofloxacin (OCUFLOX) 0.3 % ophthalmic solution Place 1 drop into the left eye 4 (four) times daily.   omeprazole (PRILOSEC) 20 MG capsule TAKE ONE (1) CAPSULE BY MOUTH TWICE A DAY. (EVERY 12 HOURS.) BEFORE A MEAL   oxyCODONE-acetaminophen (PERCOCET) 7.5-325 MG tablet Take 1 tablet by mouth every 6 (six) hours as needed for severe pain (pain score 7-10). Up to 5 times daily.   RITUXAN 500 MG/50ML injection Inject 100 mLs (1,000 mg total) into the vein every 14 (fourteen) days. Courier to [facility]. [address] [place] [address] Attn: Serafina Royals. (pharmacy dept)   simethicone (GAS-X) 80 MG chewable tablet Chew 80 mg by mouth every 6 (six) hours as needed for flatulence.   Spacer/Aero-Holding Chambers (PRO COMFORT SPACER ADULT) MISC Use it as directed for Albuterol inhaler. Okay to dispense generic alternative preferred by insurance.   No facility-administered encounter medications on file as of 03/31/2023.    ALLERGIES: Allergies  Allergen Reactions   Losartan Swelling   Nsaids     Kidney disease    VACCINATION STATUS: Immunization History  Administered Date(s) Administered   Influenza, Seasonal, Injecte, Preservative Fre 11/23/2018   Influenza,inj,Quad PF,6+ Mos 10/24/2014, 11/28/2019, 11/13/2020, 12/31/2021    Influenza-Unspecified 11/08/2011, 01/15/2013, 11/20/2015   Pneumococcal Polysaccharide-23 11/08/2016    Diabetes She presents for her follow-up diabetic visit. She has type 1 diabetes mellitus. Onset time: She was diagnosed at approximate age of 50. Her disease course has been fluctuating. There are no hypoglycemic associated symptoms. Associated symptoms include blurred vision, fatigue and foot paresthesias. Pertinent negatives for diabetes include no polydipsia, no polyphagia, no polyuria and no weight loss. There are no hypoglycemic complications. (Has has several hypoglycemic events in the past where EMS was called.  She is extremely scared of becoming hypoglycemic again.) Symptoms are stable. Diabetic complications include a CVA, heart disease, nephropathy, peripheral neuropathy and PVD. Risk factors for coronary artery disease include diabetes mellitus, dyslipidemia, hypertension, sedentary lifestyle and tobacco exposure. Current diabetic treatment includes intensive insulin program. She is compliant with treatment most of the time. Her weight is fluctuating minimally. She is following a generally unhealthy diet. When asked about meal planning, she reported none. She has not had a previous visit with a dietitian. She never (Has L AKA with prosthesis) participates in exercise. Her home blood glucose trend is decreasing steadily. Her overall blood glucose range is 180-200 mg/dl. (She presents today, accompanied by her daughter, with her glucose readings showing fluctuating glycemic profile but improved  overall.  Her POCT A1c today is 8.5% improving from last visit of 9.1%.  Analysis of her CGM shows TIR 37%, TAR 62%, TBR 1% with a GMI of 8.1%.  She just recently started PD for ESRD.  She did have the flu recently as well.) An ACE inhibitor/angiotensin II receptor blocker is not being taken. She sees a podiatrist.Eye exam is current.  Hyperlipidemia This is a chronic problem. The current episode started  more than 1 year ago. The problem is uncontrolled. Recent lipid tests were reviewed and are variable. Exacerbating diseases include chronic renal disease and diabetes. Factors aggravating her hyperlipidemia include beta blockers, smoking and fatty foods. Current antihyperlipidemic treatment includes statins. Compliance problems include adherence to diet and adherence to exercise.  Risk factors for coronary artery disease include diabetes mellitus, dyslipidemia, hypertension and a sedentary lifestyle.  Hypertension This is a chronic problem. The current episode started more than 1 year ago. The problem has been waxing and waning since onset. The problem is uncontrolled. Associated symptoms include blurred vision. There are no associated agents to hypertension. Risk factors for coronary artery disease include diabetes mellitus, dyslipidemia, smoking/tobacco exposure and sedentary lifestyle. Past treatments include beta blockers. The current treatment provides mild improvement. Compliance problems include diet and exercise.  Hypertensive end-organ damage includes kidney disease, CAD/MI, CVA, heart failure and PVD. Identifiable causes of hypertension include chronic renal disease.    Review of systems  Constitutional: +minimally fluctuating body weight,  current Body mass index is 19.88 kg/m. 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), no xerophthalmia ENT: no sore throat, no nodules palpated in throat, no dysphagia/odynophagia, no hoarseness Cardiovascular: no chest pain, no shortness of breath, no palpitations, no leg swelling Respiratory: no cough, no shortness of breath Gastrointestinal: no nausea/vomiting/diarrhea Genitourinary: +polyuria, + polyphagia Musculoskeletal: no muscle/joint aches, Hx of L AKA- with prosthesis Skin: no rashes, no hyperemia Neurological: no tremors, no numbness, no tingling,  no dizziness Psychiatric: no depression, no anxiety   Objective:    BP 110/76 (BP Location: Left Arm, Patient Position: Sitting, Cuff Size: Large)   Pulse 80   Ht 5\' 1"  (1.549 m)   Wt 105 lb 3.2 oz (47.7 kg)   LMP 12/28/2010   BMI 19.88 kg/m   Wt Readings from Last 3 Encounters:  03/31/23 105 lb 3.2 oz (47.7 kg)  03/23/23 98 lb (44.5 kg)  03/01/23 110 lb (49.9 kg)    BP Readings from Last 3 Encounters:  03/31/23 110/76  03/23/23 97/62  03/01/23 (!) 110/42    Physical Exam- Limited  Constitutional:  Body mass index is 19.88 kg/m. , not in acute distress, mildly anxious state of mind, tearful during visit, irritable Eyes:  EOMI, no exophthalmos Musculoskeletal: L AKA with new prosthesis, strength intact in all four extremities, no gross restriction of joint movements Skin:  no rashes, no hyperemia, + nicotinic discoloration to fingernails Neurological: no tremor with outstretched hands    CMP ( most recent) CMP     Component Value Date/Time   NA 131 (L) 02/24/2023 1651   NA 135 02/18/2022 1451   K 4.2 02/24/2023 1651   CL 96 (L) 02/24/2023 1651   CO2 21 (L) 02/24/2023 1651   GLUCOSE 135 (H) 02/24/2023 1651   BUN 40 (H) 02/24/2023 1651   BUN 58 (A) 08/09/2022 0000   CREATININE 4.61 (H) 02/24/2023 1651   CALCIUM 7.8 (L) 02/24/2023 1651   PROT  6.5 02/11/2022 1230   ALBUMIN 4.1 02/11/2022 1230   AST 17 02/11/2022 1230   ALT 14 02/11/2022 1230   ALKPHOS 110 02/11/2022 1230   BILITOT 0.3 02/11/2022 1230   GFRNONAA 11 (L) 02/24/2023 1651   GFRAA 28 (L) 09/28/2019 1456     Diabetic Labs (most recent): Lab Results  Component Value Date   HGBA1C 8.5 (A) 03/31/2023   HGBA1C 9.1 (A) 11/23/2022   HGBA1C 8.4 08/09/2022     Lipid Panel ( most recent) Lipid Panel     Component Value Date/Time   CHOL 151 04/09/2021 1227   TRIG 75 04/09/2021 1227   HDL 59 04/09/2021 1227   CHOLHDL 2.6 04/09/2021 1227   CHOLHDL 4.2 11/07/2016 0722   VLDL 30 11/07/2016 0722    LDLCALC 77 04/09/2021 1227   LABVLDL 15 04/09/2021 1227       Assessment & Plan:   1) Type 1 diabetes mellitus with other circulatory complication (HCC)  She presents today, accompanied by her daughter, with her glucose readings showing fluctuating glycemic profile but improved overall.  Her POCT A1c today is 8.5% improving from last visit of 9.1%.  Analysis of her CGM shows TIR 37%, TAR 62%, TBR 1% with a GMI of 8.1%.  She just recently started PD for ESRD.  She did have the flu recently as well.  - CHEY RACHELS has currently uncontrolled symptomatic type 2 DM since 53 years of age.  Recent labs reviewed.  - I had a long discussion with her about the progressive nature of diabetes and the pathology behind its complications. -her diabetes is complicated by CAD, PVD, CKD, CVA, MI, DKA and she remains at a high risk for more acute and chronic complications which include retinopathy, and neuropathy. These are all discussed in detail with her.  - Nutritional counseling repeated at each appointment due to patients tendency to fall back in to old habits.  - The patient admits there is a room for improvement in their diet and drink choices. -  Suggestion is made for the patient to avoid simple carbohydrates from their diet including Cakes, Sweet Desserts / Pastries, Ice Cream, Soda (diet and regular), Sweet Tea, Candies, Chips, Cookies, Sweet Pastries, Store Bought Juices, Alcohol in Excess of 1-2 drinks a day, Artificial Sweeteners, Coffee Creamer, and "Sugar-free" Products. This will help patient to have stable blood glucose profile and potentially avoid unintended weight gain.   - I encouraged the patient to switch to unprocessed or minimally processed complex starch and increased protein intake (animal or plant source), fruits, and vegetables.   - Patient is advised to stick to a routine mealtimes to eat 3 meals a day and avoid unnecessary snacks (to snack only to correct hypoglycemia).  -  she will be scheduled with Norm Salt, RDN, CDE for diabetes education.  - I have approached her with the following individualized plan to manage  her diabetes and patient agrees:    -She is advised to continue her Lantus 12 units SQ nightly and Novolog 1-7 units TID with meals if glucose is above 90 and she is eating (Specific instructions on how to titrate insulin dosage based on glucose readings given to patient in writing).  She is limited by her lack of meal routine.   She notes her appetite has changed since starting PD.  -She is encouraged to continue monitoring blood glucose 4 times per day using her CGM (which she was recently approved for), before meals and before bed, and  notify the clinic if she has readings less than 70 or greater than 300 for 3 tests in a row.  She is benefiting from CGM device, is advised to continue using.  - she is warned not to take insulin without proper monitoring per orders.  - Adjustment parameters are given to her for hypo and hyperglycemia in writing.  - Specific targets for  A1c;  LDL, HDL,  and Triglycerides were discussed with the patient.  2) Blood Pressure /Hypertension:  Her blood pressure is controlled to target.  She is advised to continue Carvedilol 6.25 mg po twice daily and Hydralazine 12.5 mg po twice daily.  Will defer any med changes to nephrology.  3) Lipids/Hyperlipidemia:  Her most recent lipid panel from 04/09/21 shows controlled LDL of 77.5.  She is advised to continue Lipitor 80 mg po daily at bedtime.  Side effects and precautions discussed with her.  She is also advised to avoid fried foods and butter.  4)  Weight/Diet: Her Body mass index is 19.88 kg/m.  -  she is not a candidate for weight loss.  Exercise, and detailed carbohydrates information provided  -  detailed on discharge instructions.  5) Vitamin D deficiency Her most recent vitamin D level was 16.2 on 04/09/21.  She is not currently on supplementation.    6)  Chronic Care/Health Maintenance: -she is on Statin medications and is encouraged to initiate and continue to follow up with Ophthalmology, Dentist,  Podiatrist at least yearly or according to recommendations, and advised to QUIT SMOKING. I have recommended yearly flu vaccine and pneumonia vaccine at least every 5 years; moderate intensity exercise for up to 150 minutes weekly; and  sleep for at least 7 hours a day.  Smoking cessation instruction/counseling given:  counseled patient on the dangers of tobacco use, advised patient to stop smoking, and reviewed strategies to maximize success   - she is advised to maintain close follow up with Anabel Halon, MD for primary care needs, as well as her other providers for optimal and coordinated care.     I spent  38  minutes in the care of the patient today including review of labs from CMP, Lipids, Thyroid Function, Hematology (current and previous including abstractions from other facilities); face-to-face time discussing  her blood glucose readings/logs, discussing hypoglycemia and hyperglycemia episodes and symptoms, medications doses, her options of short and long term treatment based on the latest standards of care / guidelines;  discussion about incorporating lifestyle medicine;  and documenting the encounter. Risk reduction counseling performed per USPSTF guidelines to reduce obesity and cardiovascular risk factors.     Please refer to Patient Instructions for Blood Glucose Monitoring and Insulin/Medications Dosing Guide"  in media tab for additional information. Please  also refer to " Patient Self Inventory" in the Media  tab for reviewed elements of pertinent patient history.  Mervyn Skeeters participated in the discussions, expressed understanding, and voiced agreement with the above plans.  All questions were answered to her satisfaction. she is encouraged to contact clinic should she have any questions or concerns prior to her return  visit.   Follow up plan: - Return in about 4 months (around 07/31/2023) for Diabetes F/U with A1c in office, No previsit labs, Bring meter and logs.   Ronny Bacon, [facility] [address] [place], [place] [address] Phone: [phone] Fax: [phone]  03/31/2023, 11:49 AM

## 2023-04-06 ENCOUNTER — Other Ambulatory Visit: Payer: Self-pay | Admitting: Internal Medicine

## 2023-04-15 LAB — HM DIABETES EYE EXAM

## 2023-05-09 ENCOUNTER — Other Ambulatory Visit: Payer: Self-pay | Admitting: Nurse Practitioner

## 2023-05-09 ENCOUNTER — Encounter: Payer: Self-pay | Admitting: Internal Medicine

## 2023-05-09 ENCOUNTER — Encounter: Payer: Self-pay | Admitting: Nurse Practitioner

## 2023-05-09 ENCOUNTER — Ambulatory Visit (INDEPENDENT_AMBULATORY_CARE_PROVIDER_SITE_OTHER): Payer: Medicaid Other | Admitting: Internal Medicine

## 2023-05-09 VITALS — BP 110/68 | HR 76 | Ht 61.0 in | Wt 106.4 lb

## 2023-05-09 DIAGNOSIS — N186 End stage renal disease: Secondary | ICD-10-CM | POA: Diagnosis not present

## 2023-05-09 DIAGNOSIS — K219 Gastro-esophageal reflux disease without esophagitis: Secondary | ICD-10-CM

## 2023-05-09 DIAGNOSIS — Z992 Dependence on renal dialysis: Secondary | ICD-10-CM | POA: Insufficient documentation

## 2023-05-09 DIAGNOSIS — F411 Generalized anxiety disorder: Secondary | ICD-10-CM

## 2023-05-09 DIAGNOSIS — J309 Allergic rhinitis, unspecified: Secondary | ICD-10-CM

## 2023-05-09 DIAGNOSIS — G8191 Hemiplegia, unspecified affecting right dominant side: Secondary | ICD-10-CM

## 2023-05-09 DIAGNOSIS — E1059 Type 1 diabetes mellitus with other circulatory complications: Secondary | ICD-10-CM

## 2023-05-09 DIAGNOSIS — I10 Essential (primary) hypertension: Secondary | ICD-10-CM | POA: Diagnosis not present

## 2023-05-09 DIAGNOSIS — I743 Embolism and thrombosis of arteries of the lower extremities: Secondary | ICD-10-CM

## 2023-05-09 DIAGNOSIS — I739 Peripheral vascular disease, unspecified: Secondary | ICD-10-CM

## 2023-05-09 DIAGNOSIS — Z89512 Acquired absence of left leg below knee: Secondary | ICD-10-CM

## 2023-05-09 DIAGNOSIS — R109 Unspecified abdominal pain: Secondary | ICD-10-CM

## 2023-05-09 MED ORDER — LEVOCETIRIZINE DIHYDROCHLORIDE 5 MG PO TABS: 5.0000 mg | ORAL_TABLET | Freq: Every evening | ORAL | 3 refills | Status: AC

## 2023-05-09 MED ORDER — DEXCOM G7 SENSOR MISC
1.0000 | 3 refills | Status: AC
Start: 2023-05-09 — End: ?

## 2023-05-09 MED ORDER — MISC. DEVICES MISC: 0 refills | Status: AC

## 2023-05-09 MED ORDER — OMEPRAZOLE 20 MG PO CPDR: 20.0000 mg | DELAYED_RELEASE_CAPSULE | Freq: Every day | ORAL | 1 refills | Status: DC

## 2023-05-09 NOTE — Patient Instructions (Signed)
 Please take Xyzal  for allergies. Please use Flonase for nasal congestion/allergies.  Please continue to take medications as prescribed.  Please continue to follow low carb diet.

## 2023-05-09 NOTE — Progress Notes (Signed)
 Established Patient Office Visit  Subjective:  Patient ID: [name], female    DOB: 1970/08/18  Age: 53 y.o. MRN: [id number]  CC:  Chief Complaint  Patient presents with   Medical Management of Chronic Issues    4 month f/u, pt would like orders for small wheelchair and bedside commode.     HPI [name] is a 53 y.o. 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 Her daughter was present during the visit.  Type I DM: Her last HbA1C was 8.5 in 03/25, which is better than prior.  She has been taking Lantus  20 units at bedtime and follows insulin  sliding scale.  Her daughter checks her blood sugars before meals and at bedtime, which have been variable.  She has been following up with endocrinology for it.  Of note, she eats meals regularly now most of the days, and takes mealtime insulin  due to it.   HTN: Her blood pressure is wnl today.  She is on carvedilol  6.25 mg twice daily. Her BP has been better controlled since starting peritoneal dialysis.  Denies any chest pain or dyspnea currently.  She also has ESRD, now on peritoneal dialysis.  She denies any dysuria, hematuria or urinary hesitancy.  She follows up with Dr. Carrolyn Clan.  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  Her mood symptoms and sleep has improved with Remeron .  She also had anhedonia, fatigue and lack of concentration, which are improving.  Denies SI or HI.  She also reports loss of weight in the past, but her daughter reports that she has very poor p.o. intake.  She is tolerating Remeron  15 mg nightly now.  Her weight has been stable compared to the last visit.  She complains of chronic low back pain.  She has difficulty  ambulating, and is mostly wheelchair-bound or bedridden.  Her daughter helps her with ADLs.  Does not have any sacral or pressure ulcer currently.  She is on oxycodone  for chronic pain.  She also has radiating symptoms to RLE, but did not like gabapentin or amitriptyline for neuropathy.  She has had adjustment in her prosthetic and has been feeling better now. She needs a new wheelchair for better mobility.  Her daughter is her 24-hour caregiver currently and can help propel the wheelchair for safe mobility.  She also requests a bedside commode, which can reduce fall risk.   She reports chronic nasal congestion, postnasal drip and sinus pressure related headache.  She has tried Flonase with mild relief.  Denies any fever, chills, dyspnea or wheezing currently.    Past Medical History:  Diagnosis Date   Acquired absence of left leg below knee (HCC)    Anxiety    was on xanax  for years but discontinued prior to prior PCP retiring as she was on opioids and was told she would not find someone to write for both   Arterial thromboembolism (HCC)    Prior embolectomy, previously on Coumadin    Cataract    Phreesia 11/19/2019   Chronic kidney disease    Phreesia 11/19/2019   Clotting disorder (HCC)    Phreesia 11/19/2019   Coronary atherosclerosis of native coronary artery    a. s/p DES to LAD in 2014  Depression    Depression    Phreesia 11/19/2019   Diabetes mellitus without complication (HCC)    Phreesia 11/19/2019   Diabetic ketoacidosis with coma associated with type 1 diabetes mellitus (HCC)    DKA, type 1 (HCC) 11/17/2016   Facial cellulitis    12/2010   Glomerulonephritis    Headache(784.0)    Hemiplegia, unspecified affecting right dominant side (HCC)    History of stroke    Hyperlipidemia    Insulin  dependent diabetes mellitus    History of diabetic ketoacidosis   Ischemic cardiomyopathy    a. EF 30-35% in 2018 b. EF at 45-50% by repeat imaging in 02/2019   Left carotid artery  occlusion    Major depressive disorder, single episode, unspecified    N&V (nausea and vomiting) 06/03/2021   Pancreatitis, acute 11/07/2016   Peripheral arterial disease (HCC)    Shingles    11/2010   ST elevation myocardial infarction (STEMI) of anterior wall [facility])    Late presentation September 2014   Stroke [facility])    2018   Upper abdominal pain 06/03/2021    Past Surgical History:  Procedure Laterality Date   AMPUTATION  03/03/2011   Procedure: AMPUTATION DIGIT;  Surgeon: Timothy Ford, MD;  Location: [facility] OR;  Service: Orthopedics;  Laterality: Left;  Left Foot Amputation 4th and 5th toes at MTP joint   AMPUTATION  04/22/2011   Procedure: AMPUTATION BELOW KNEE;  Surgeon: Timothy Ford, MD;  Location[facility] OR;  Service: Orthopedics;  Laterality: Left;  Left Below Knee Amputation   BIOPSY  07/17/2021   Procedure: BIOPSY;  Surgeon: Vinetta Greening, DO;  Location: [facility] ENDO SUITE;  Service: Endoscopy;;   CATARACT EXTRACTION W/PHACO Left 01/09/2020   Procedure: CATARACT EXTRACTION PHACO AND INTRAOCULAR LENS PLACEMENT (IOC);  Surgeon: Tarri Farm, MD;  Location: [facility] ORS;  Service: Ophthalmology;  Laterality: Left;  CDE: 9.73   COLONOSCOPY WITH PROPOFOL  N/A 12/14/2021   Procedure: COLONOSCOPY WITH PROPOFOL ;  Surgeon: Vinetta Greening, DO;  Location: [facility] ENDO SUITE;  Service: Endoscopy;  Laterality: N/A;  9:45 am   DILATION AND CURETTAGE OF UTERUS     EMBOLECTOMY  01/29/2011   Procedure: EMBOLECTOMY;  Surgeon: Palma Bob, MD;  Location: [facility] OR;  Service: Vascular;  Laterality: Left;  Left Popliteal and Tibial Embolectomy with patch angioplasty   ESOPHAGOGASTRODUODENOSCOPY (EGD) WITH PROPOFOL  N/A 07/17/2021   Procedure: ESOPHAGOGASTRODUODENOSCOPY (EGD) WITH PROPOFOL ;  Surgeon: Vinetta Greening, DO;  Location: [facility] ENDO SUITE;  Service: Endoscopy;  Laterality: N/A;  1:00pm   INCISION AND DRAINAGE ABSCESS N/A 04/06/2016   Procedure: INCISION AND DRAINAGE ABSCESS;  Surgeon: Albino Hum, MD;   Location: [facility] ORS;  Service: Gynecology;  Laterality: N/A;   LEFT HEART CATHETERIZATION WITH CORONARY ANGIOGRAM N/A 10/01/2012   Procedure: LEFT HEART CATHETERIZATION WITH CORONARY ANGIOGRAM;  Surgeon: Peter M Swaziland, MD;  Location: [facility] CATH LAB;  Service: Cardiovascular;  Laterality: N/A;   LOWER EXTREMITY ANGIOGRAM N/A 01/27/2011   Procedure: LOWER EXTREMITY ANGIOGRAM;  Surgeon: Mayo Speck, MD;  Location: [facility] CATH LAB;  Service: Cardiovascular;  Laterality: N/A;   LOWER EXTREMITY ANGIOGRAM N/A 01/28/2011   Procedure: LOWER EXTREMITY ANGIOGRAM;  Surgeon: Arvil Lauber, MD;  Location: [facility] CATH LAB;  Service: Cardiovascular;  Laterality: N/A;   LOWER EXTREMITY ANGIOGRAM Left 01/29/2011   Procedure: LOWER EXTREMITY ANGIOGRAM;  Surgeon: Palma Bob, MD;  Location: [facility] CATH LAB;  Service: Cardiovascular;  Laterality: Left;   MULTIPLE TOOTH EXTRACTIONS  POLYPECTOMY  12/14/2021   Procedure: POLYPECTOMY;  Surgeon: Vinetta Greening, DO;  [facility];  Service: Endoscopy;;   TEE WITHOUT CARDIOVERSION  04/15/2011   Procedure: TRANSESOPHAGEAL ECHOCARDIOGRAM (TEE);  Surgeon: Natha Bair, MD;  Location[facility] ENDO [facility];  Service: Cardiovascular;  Laterality: N/A;   WRIST SURGERY     Left    Family History  Problem Relation Age of Onset   COPD Mother    Hypertension Mother    Diabetes Mother    Stroke Mother    Coronary artery disease Mother    Diabetes Father    Hypertension Father    Coronary artery disease Father    Kidney disease Father        on dialysis, died from heart attack while being dialzed   Pancreatitis Maternal Aunt    Cancer Maternal Aunt        unknown primary   Anesthesia problems Neg Hx    Colon cancer Neg Hx     Social History   Socioeconomic History   Marital status: Single    Spouse name: Not on file   Number of children: 1   Years of education: Not on file   Highest education level: Not on file  Occupational History   Not on file  Tobacco Use    Smoking status: Every Day    Current packs/day: 0.50    Average packs/day: 0.5 packs/day for 33.8 years (16.9 ttl pk-yrs)    Types: Cigarettes    Start date: 07/06/1989   Smokeless tobacco: Never  Vaping Use   Vaping status: Never Used  Substance and Sexual Activity   Alcohol use: No    Alcohol/week: 0.0 standard drinks of alcohol    Comment: rarely   Drug use: Not Currently    Frequency: 1.0 times per week    Types: Marijuana   Sexual activity: Not Currently    Partners: Male    Comment: pt stated that her tubal was only 50% effective- "not cut and burned"  Other Topics Concern   Not on file  Social History Narrative   Unemployed.  Lives in [place] with [place], [place], and mother.  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   Social Drivers of Corporate investment banker Strain: Low Risk  (02/09/2023)   Received from [facility]   Overall Financial Resource Strain (CARDIA)    Difficulty of Paying Living Expenses: Not hard at all  Food Insecurity: No Food Insecurity (02/09/2023)   Received from [facility]   Hunger Vital Sign    Worried About Running Out of Food in the Last Year: Never true    Ran Out of Food in the Last Year: Never true  Transportation Needs: No Transportation Needs (02/09/2023)   Received from [facility] - Transportation    Lack of Transportation (Medical): No    Lack of Transportation (Non-Medical): No  Physical Activity: Unknown (12/23/2022)   Received from [facility]   Exercise Vital Sign    Days of Exercise per Week: 0 days    Minutes of Exercise per Session: Not on file  Recent Concern: Physical Activity - Inactive (11/11/2022)   Exercise Vital Sign    Days of Exercise per Week: 0 days    Minutes of Exercise per Session: 0 min  Stress: No Stress Concern Present (01/28/2023)   Received from [facility] - Occupational Stress Questionnaire    Feeling of  Stress : Not at all  Recent  Concern: Stress - Stress Concern Present (12/23/2022)   Received from [facility] of Occupational Health - Occupational Stress Questionnaire    Feeling of Stress : To some extent  Social Connections: Moderately Integrated (12/23/2022)   Received from [facility]   Social Network    How would you rate your social network (family, work, friends)?: Adequate participation with social networks  Recent Concern: Social Connections - Socially Isolated (11/11/2022)   Social Connection and Isolation Panel [NHANES]    Frequency of Communication with Friends and Family: Once a week    Frequency of Social Gatherings with Friends and Family: Once a week    Attends Religious Services: Never    Database administrator or Organizations: No    Attends Banker Meetings: Never    Marital Status: Never married  Intimate Partner Violence: Not At Risk (01/28/2023)   Received from [facility]   HITS    Over the last 12 months how often did your partner physically hurt you?: Never    Over the last 12 months how often did your partner insult you or talk down to you?: Never    Over the last 12 months how often did your partner threaten you with physical harm?: Never    Over the last 12 months how often did your partner scream or curse at you?: Never    Outpatient Medications Prior to Visit  Medication Sig Dispense Refill   acetaminophen  (TYLENOL ) 500 MG tablet Take 500 mg by mouth every 6 (six) hours as needed for moderate pain.     albuterol  (VENTOLIN  HFA) 108 (90 Base) MCG/ACT inhaler Inhale 2 puffs into the lungs every 6 (six) hours as needed for wheezing or shortness of breath. 18 g 0   atorvastatin  (LIPITOR ) 80 MG tablet TAKE ONE TABLET (80MG  TOTAL) BY MOUTH DAILY 90 tablet 3   calcium  carbonate (TUMS EX) 750 MG chewable tablet Chew 1 tablet by mouth daily as needed for heartburn.     carvedilol  (COREG ) 6.25 MG tablet Take 1 tablet (6.25 mg total) by mouth 2 (two) times  daily with a meal. 180 tablet 3   Continuous Glucose Sensor (DEXCOM G7 SENSOR) MISC Inject 1 Application into the skin as directed. Change sensor every 10 days as directed. 9 each 3   ELIQUIS  2.5 MG TABS tablet TAKE ONE TABLET (2.5MG  TOTAL) BY MOUTH TWO TIMES DAILY 180 tablet 1   insulin  aspart (NOVOLOG ) 100 UNIT/ML FlexPen Inject 1-7 Units into the skin 3 (three) times daily with meals. 15 mL 3   insulin  glargine (LANTUS  SOLOSTAR) 100 UNIT/ML Solostar Pen Inject 20 Units into the skin at bedtime. 18 mL 3   Insulin  Pen Needle (UNIFINE PENTIPS) 31G X 8 MM MISC USE TO INJECT INSULIN  SUBCUTANEOUSLY . USE FOR BOTH SLIDING SCALE AND FOR DAILYINSULIN DOSES. 100 each 11   metoCLOPramide  (REGLAN ) 5 MG tablet Take 1 tablet (5 mg total) by mouth every 8 (eight) hours as needed for nausea. 90 tablet 1   mirtazapine  (REMERON ) 15 MG tablet Take 15 mg by mouth at bedtime.     Misc. Devices MISC Wheelchair cushion - small. 1 each 0   nitroGLYCERIN  (NITROSTAT ) 0.4 MG SL tablet PLACE ONE TABLET (0.4MG  TOTAL) UNDER THETONGUE EVERY FIVE MINUTES AS NEEDED FOR CHEST PAIN 25 tablet 2   oxyCODONE -acetaminophen  (PERCOCET) 7.5-325 MG tablet Take 1 tablet by mouth every 6 (six) hours as needed for severe  pain (pain score 7-10). Up to 5 times daily.     simethicone (GAS-X) 80 MG chewable tablet Chew 80 mg by mouth every 6 (six) hours as needed for flatulence.     Spacer/Aero-Holding Chambers (PRO COMFORT SPACER ADULT) MISC Use it as directed for Albuterol  inhaler. Okay to dispense generic alternative preferred by insurance. 1 each 2   cetirizine (ZYRTEC) 10 MG tablet Take 10 mg by mouth daily as needed for allergies.     glucose blood (ACCU-CHEK GUIDE) test strip Use as instructed to monitor glucose 4 times daily, before meals and before bed. 400 each 12   guaiFENesin -codeine  100-10 MG/5ML syrup Take 5 mLs by mouth 3 (three) times daily as needed for cough. 120 mL 0   hydrALAZINE  (APRESOLINE ) 10 MG tablet Take 1 tablet (10 mg  total) by mouth 2 (two) times daily. 60 tablet 3   linaclotide  (LINZESS ) 290 MCG CAPS capsule Take 1 capsule (290 mcg total) by mouth daily before breakfast. 90 capsule 3   mirtazapine  (REMERON ) 7.5 MG tablet Take 1 tablet (7.5 mg total) by mouth at bedtime. 30 tablet 5   ofloxacin  (OCUFLOX ) 0.3 % ophthalmic solution Place 1 drop into the left eye 4 (four) times daily. 5 mL 0   omeprazole  (PRILOSEC) 20 MG capsule TAKE ONE (1) CAPSULE BY MOUTH TWICE A DAY. (EVERY 12 HOURS.) BEFORE A MEAL 60 capsule 3   RITUXAN  500 MG/50ML injection Inject 100 mLs (1,000 mg total) into the vein every 14 (fourteen) days. Courier to [facility]. [address] [place] [address] Attn: Rodolfo Clan. (pharmacy dept) 200 mL 0   No facility-administered medications prior to visit.    Allergies  Allergen Reactions   Losartan  Swelling   Nsaids     Kidney disease    ROS Review of Systems  Constitutional:  Negative for chills and fever.  HENT:  Positive for congestion and sinus pressure. Negative for sore throat.   Eyes:  Positive for discharge and visual disturbance.  Respiratory:  Negative for cough and shortness of breath.   Cardiovascular:  Negative for chest pain and palpitations.  Gastrointestinal:  Positive for nausea. Negative for abdominal pain.  Endocrine: Negative for polydipsia and polyuria.  Genitourinary:  Negative for dysuria and hematuria.  Musculoskeletal:  Positive for arthralgias and back pain. Negative for neck pain and neck stiffness.  Skin:  Negative for rash.  Neurological:  Negative for dizziness and weakness.  Psychiatric/Behavioral:  Negative for agitation and behavioral problems. The patient is nervous/anxious.       Objective:    Physical Exam Vitals reviewed.  Constitutional:      General: She is not in acute distress.    Appearance: She is cachectic. She is not diaphoretic.     Comments: In wheelchair  HENT:     Head: Normocephalic and atraumatic.     Nose: Congestion  present.     Mouth/Throat:     Mouth: Mucous membranes are moist.  Eyes:     General: No scleral icterus.       Left eye: Discharge present.    Extraocular Movements: Extraocular movements intact.  Cardiovascular:     Rate and Rhythm: Normal rate and regular rhythm.     Heart sounds: Normal heart sounds. No murmur heard. Pulmonary:     Breath sounds: Normal breath sounds. No wheezing or rales.  Abdominal:     Palpations: Abdomen is soft.     Tenderness: There is abdominal tenderness (Mild, generalized).  Musculoskeletal:  Cervical back: Neck supple. No tenderness.     Comments: S/p left BKA, prosthesis in place  Skin:    General: Skin is warm.  Neurological:     General: No focal deficit present.     Mental Status: She is alert and oriented to person, place, and time.     Sensory: Sensory deficit present.     Motor: Weakness (Right LE) present.  Psychiatric:        Mood and Affect: Mood normal.        Behavior: Behavior normal.     BP 110/68   Pulse 76   Ht 5\' 1"  (1.549 m)   Wt 106 lb 6.4 oz (48.3 kg)   LMP 12/28/2010   SpO2 97%   BMI 20.10 kg/m  Wt Readings from Last 3 Encounters:  05/09/23 106 lb 6.4 oz (48.3 kg)  03/31/23 105 lb 3.2 oz (47.7 kg)  03/23/23 98 lb (44.5 kg)    Lab Results  Component Value Date   TSH 3.070 04/09/2021   Lab Results  Component Value Date   WBC 4.1 02/24/2023   HGB 10.9 (L) 02/24/2023   HCT 34.1 (L) 02/24/2023   MCV 92.7 02/24/2023   PLT 220 02/24/2023   Lab Results  Component Value Date   NA 131 (L) 02/24/2023   K 4.2 02/24/2023   CO2 21 (L) 02/24/2023   GLUCOSE 135 (H) 02/24/2023   BUN 40 (H) 02/24/2023   CREATININE 4.61 (H) 02/24/2023   BILITOT 0.3 02/11/2022   ALKPHOS 110 02/11/2022   AST 17 02/11/2022   ALT 14 02/11/2022   PROT 6.5 02/11/2022   ALBUMIN  4.1 02/11/2022   CALCIUM  7.8 (L) 02/24/2023   ANIONGAP 14 02/24/2023   EGFR 18 08/09/2022   Lab Results  Component Value Date   CHOL 151 04/09/2021    Lab Results  Component Value Date   HDL 59 04/09/2021   Lab Results  Component Value Date   LDLCALC 77 04/09/2021   Lab Results  Component Value Date   TRIG 75 04/09/2021   Lab Results  Component Value Date   CHOLHDL 2.6 04/09/2021   Lab Results  Component Value Date   HGBA1C 8.5 (A) 03/31/2023      Assessment & Plan:   Problem List Items Addressed This Visit       Cardiovascular and Mediastinum   Thromboembolism of lower extremity artery (HCC)   History of prior embolectomy and left BKA On Eliquis  2.5 mg BID, followed by cardiology      PAD (peripheral artery disease) (HCC)   Related to type 1 DM S/p left AKA On Eliquis  due to coagulopathy      Essential hypertension - Primary   BP Readings from Last 1 Encounters:  05/09/23 110/68   Well-controlled with Coreg  6.25 mg twice daily Counseled for compliance with the medications Advised DASH diet        Respiratory   Allergic sinusitis   Started Xyzal  5 mg QD Continue Flonase       Relevant Medications   levocetirizine (XYZAL ) 5 MG tablet     Digestive   GERD (gastroesophageal reflux disease)   Well controlled with omeprazole , refilled      Relevant Medications   omeprazole  (PRILOSEC) 20 MG capsule     Endocrine   Diabetes mellitus type I (HCC)   Lab Results  Component Value Date   HGBA1C 8.5 (A) 03/31/2023   Uncontrolled, but improving Associated with PAD On Lantus  20 U qHS  and ISS, followed by Endocrinology Advised to follow diabetic diet - needs to eat at regular intervals and take mealtime insulin  On statin Diabetic eye exam: Advised to follow up with Ophthalmology        Nervous and Auditory   Hemiplegia, unspecified affecting right dominant side [facility])   S/p CVA Has left BKA Wheelchair-bound currently, prescription for manual wheelchair provided Bedside commode prescribed to reduce fall risk      Relevant Medications   Misc. Devices MISC   Misc. Devices MISC   Other  Relevant Orders   DME Wheelchair manual   DME Bedside commode     Genitourinary   ESRD (end stage renal disease) (HCC)   Related to uncontrolled type 2 DM Progressive CKD Followed by Nephrology -gets peritoneal dialysis      Relevant Medications   Misc. Devices MISC   Other Relevant Orders   DME Wheelchair manual     Other   GAD (generalized anxiety disorder) (Chronic)   Considering her weight loss due to lack of appetite, continue Remeron  15 mg dose Her chronic medical conditions are also contributing to her depression and anxiety Did not tolerate Elavil      Relevant Medications   mirtazapine  (REMERON ) 15 MG tablet   Hx of BKA, left (HCC)   For history of PVD Had referred to prosthetic clinic for adjustment of prosthetic -better tolerating new prosthetic      Relevant Medications   Misc. Devices MISC   Misc. Devices MISC   Other Relevant Orders   DME Wheelchair manual   DME Bedside commode   Dependence on peritoneal dialysis [facility])   Tolerating peritoneal dialysis well Followed by nephrology        Meds ordered this encounter  Medications   Misc. Devices MISC    Sig: Manual wheelchair - customized size.    Dispense:  1 each    Refill:  0   Misc. Devices MISC    Sig: Bedside commode - 1.    Dispense:  1 each    Refill:  0   levocetirizine (XYZAL ) 5 MG tablet    Sig: Take 1 tablet (5 mg total) by mouth every evening.    Dispense:  30 tablet    Refill:  3   omeprazole  (PRILOSEC) 20 MG capsule    Sig: Take 1 capsule (20 mg total) by mouth daily.    Dispense:  90 capsule    Refill:  1    Follow-up: Return in about 4 months (around 09/08/2023) for HTN and DM.    Meldon Sport, MD

## 2023-05-10 ENCOUNTER — Other Ambulatory Visit (HOSPITAL_COMMUNITY): Payer: Self-pay

## 2023-05-10 ENCOUNTER — Telehealth: Payer: Self-pay

## 2023-05-10 DIAGNOSIS — J309 Allergic rhinitis, unspecified: Secondary | ICD-10-CM | POA: Insufficient documentation

## 2023-05-10 MED ORDER — ACCU-CHEK GUIDE TEST VI STRP: ORAL_STRIP | 12 refills | Status: AC

## 2023-05-10 NOTE — Assessment & Plan Note (Signed)
 Started Xyzal  5 mg QD Continue Flonase

## 2023-05-10 NOTE — Assessment & Plan Note (Signed)
 Considering her weight loss due to lack of appetite, continue Remeron  15 mg dose Her chronic medical conditions are also contributing to her depression and anxiety Did not tolerate Elavil

## 2023-05-10 NOTE — Assessment & Plan Note (Addendum)
 History of prior embolectomy and left BKA On Eliquis  2.5 mg BID, followed by cardiology

## 2023-05-10 NOTE — Assessment & Plan Note (Signed)
Related to type 1 DM S/p left AKA On Eliquis due to coagulopathy 

## 2023-05-10 NOTE — Assessment & Plan Note (Signed)
 For history of PVD Had referred to prosthetic clinic for adjustment of prosthetic -better tolerating new prosthetic

## 2023-05-10 NOTE — Telephone Encounter (Signed)
 Pharmacy Patient Advocate Encounter   Received notification from Patient Advice Request messages that prior authorization for Dexcom G7 sensor is required/requested.   Insurance verification completed.   The patient is insured through [facility] MEDICAID .   Per test claim: PA required; PA submitted to above mentioned insurance via [facility] Tracks Key/confirmation #/EOC [id number] W Status is pending

## 2023-05-10 NOTE — Assessment & Plan Note (Signed)
 Tolerating peritoneal dialysis well Followed by nephrology

## 2023-05-10 NOTE — Telephone Encounter (Signed)
 Patient needs PA for Dexcom for her type 1 diabetes.

## 2023-05-10 NOTE — Assessment & Plan Note (Signed)
 Related to uncontrolled type 2 DM Progressive CKD Followed by Nephrology -gets peritoneal dialysis

## 2023-05-10 NOTE — Assessment & Plan Note (Signed)
Well-controlled with omeprazole, refilled 

## 2023-05-10 NOTE — Addendum Note (Signed)
 Addended by: Jaquanna Ballentine J on: 05/10/2023 07:45 AM   Modules accepted: Orders

## 2023-05-10 NOTE — Assessment & Plan Note (Signed)
 S/p CVA Has left BKA Wheelchair-bound currently, prescription for manual wheelchair provided Bedside commode prescribed to reduce fall risk

## 2023-05-10 NOTE — Assessment & Plan Note (Signed)
 BP Readings from Last 1 Encounters:  05/09/23 110/68   Well-controlled with Coreg  6.25 mg twice daily Counseled for compliance with the medications Advised DASH diet

## 2023-05-10 NOTE — Assessment & Plan Note (Signed)
 Lab Results  Component Value Date   HGBA1C 8.5 (A) 03/31/2023   Uncontrolled, but improving Associated with PAD On Lantus  20 U qHS and ISS, followed by Endocrinology Advised to follow diabetic diet - needs to eat at regular intervals and take mealtime insulin  On statin Diabetic eye exam: Advised to follow up with Ophthalmology

## 2023-05-24 NOTE — Telephone Encounter (Signed)
 Pharmacy Patient Advocate Encounter  Received notification from [place] MEDICAID that Prior Authorization for Dexcom G7 sensor  has been APPROVED from 05/10/23 to 11/06/23   PA #/Case ID/Reference #: [id number]

## 2023-05-24 NOTE — Telephone Encounter (Signed)
 Patient was called shared with both her and her daughter that she had been approved for the Dexcom G 7 sensor.

## 2023-06-21 ENCOUNTER — Encounter: Payer: Self-pay | Admitting: Nurse Practitioner

## 2023-07-13 ENCOUNTER — Ambulatory Visit: Admitting: Podiatry

## 2023-07-26 ENCOUNTER — Encounter: Payer: Self-pay | Admitting: Podiatry

## 2023-07-26 ENCOUNTER — Ambulatory Visit (INDEPENDENT_AMBULATORY_CARE_PROVIDER_SITE_OTHER): Admitting: Podiatry

## 2023-07-26 DIAGNOSIS — I70209 Unspecified atherosclerosis of native arteries of extremities, unspecified extremity: Secondary | ICD-10-CM | POA: Diagnosis not present

## 2023-07-26 DIAGNOSIS — B351 Tinea unguium: Secondary | ICD-10-CM

## 2023-07-26 DIAGNOSIS — E1151 Type 2 diabetes mellitus with diabetic peripheral angiopathy without gangrene: Secondary | ICD-10-CM | POA: Diagnosis not present

## 2023-07-26 DIAGNOSIS — M79671 Pain in right foot: Secondary | ICD-10-CM | POA: Diagnosis not present

## 2023-07-26 NOTE — Progress Notes (Signed)
 Patient presents for evaluation and treatment of tenderness and some redness around nails feet.  Tenderness around toes with walking and wearing shoes.  Physical exam:  General appearance: Alert, pleasant, and in no acute distress.  Vascular: Pedal pulses: DP 0/4 rt, PT 0/4 lt. mild edema lower legs right  Neurological:    Dermatologic:  Nails thickened, disfigured, discolored 1-5 BL with subungual debris.  Redness and hypertrophic nail folds along nail folds bilaterally but no signs of drainage or infection.  Musculoskeletal:  Status post BKA amputation left   Diagnosis: 1. Painful onychomycotic nails 1- 5 RT 2. Pain toes 1-5 RT 3.  Diabetes mellitus type 2 with PVD  Plan: Debrided onychomycotic nails 1 through 5 bilaterally.  Return 3 months [place] [facility]

## 2023-07-28 ENCOUNTER — Other Ambulatory Visit: Payer: Self-pay | Admitting: Cardiology

## 2023-07-28 NOTE — Telephone Encounter (Signed)
 Prescription refill request for Eliquis  received. Indication: PAD, Lupus anticoagulant  Last office visit: [place] 03/01/2023 Scr:  4.61, 02/24/2023 Age: 53 yo  Weight: 48.3 kg   Refill sent.

## 2023-08-01 ENCOUNTER — Ambulatory Visit (INDEPENDENT_AMBULATORY_CARE_PROVIDER_SITE_OTHER): Admitting: Nurse Practitioner

## 2023-08-01 ENCOUNTER — Encounter: Payer: Self-pay | Admitting: Nurse Practitioner

## 2023-08-01 VITALS — BP 130/70 | HR 98 | Ht 61.0 in | Wt 112.6 lb

## 2023-08-01 DIAGNOSIS — E1059 Type 1 diabetes mellitus with other circulatory complications: Secondary | ICD-10-CM

## 2023-08-01 DIAGNOSIS — E559 Vitamin D deficiency, unspecified: Secondary | ICD-10-CM | POA: Diagnosis not present

## 2023-08-01 DIAGNOSIS — I10 Essential (primary) hypertension: Secondary | ICD-10-CM

## 2023-08-01 DIAGNOSIS — Z794 Long term (current) use of insulin: Secondary | ICD-10-CM

## 2023-08-01 DIAGNOSIS — E782 Mixed hyperlipidemia: Secondary | ICD-10-CM

## 2023-08-01 LAB — POCT GLYCOSYLATED HEMOGLOBIN (HGB A1C): Hemoglobin A1C: 8.5 % — AB (ref 4.0–5.6)

## 2023-08-01 NOTE — Progress Notes (Signed)
 Endocrinology Follow Up Note       08/01/2023, 12:00 PM   Subjective:    Patient ID: [name], female    DOB: 07/10/1970.  [name] is being seen in follow up after being seen in consultation for management of currently uncontrolled symptomatic diabetes requested by  Tobie Suzzane POUR, MD.   Past Medical History:  Diagnosis Date   Acquired absence of left leg below knee (HCC)    Anxiety    was on xanax  for years but discontinued prior to prior PCP retiring as [name] was on opioids and was told [name] would not find someone to write for both   Arterial thromboembolism (HCC)    Prior embolectomy, previously on Coumadin    Cataract    Phreesia 11/19/2019   Chronic kidney disease    Phreesia 11/19/2019   Clotting disorder (HCC)    Phreesia 11/19/2019   Coronary atherosclerosis of native coronary artery    a. s/p DES to LAD in 2014   Depression    Depression    Phreesia 11/19/2019   Diabetes mellitus without complication (HCC)    Phreesia 11/19/2019   Diabetic ketoacidosis with coma associated with type 1 diabetes mellitus (HCC)    DKA, type 1 (HCC) 11/17/2016   Facial cellulitis    12/2010   Glomerulonephritis    Headache(784.0)    Hemiplegia, unspecified affecting right dominant side (HCC)    History of stroke    Hyperlipidemia    Insulin  dependent diabetes mellitus    History of diabetic ketoacidosis   Ischemic cardiomyopathy    a. EF 30-35% in 2018 b. EF at 45-50% by repeat imaging in 02/2019   Left carotid artery occlusion    Major depressive disorder, single episode, unspecified    N&V (nausea and vomiting) 06/03/2021   Pancreatitis, acute 11/07/2016   Peripheral arterial disease (HCC)    Shingles    11/2010   ST elevation myocardial infarction (STEMI) of anterior wall [facility])    Late presentation September 2014   Stroke [facility])    2018   Upper abdominal pain 06/03/2021    Past Surgical  History:  Procedure Laterality Date   AMPUTATION  03/03/2011   Procedure: AMPUTATION DIGIT;  Surgeon: Jerona LULLA Sage, MD;  Location: [facility] OR;  Service: Orthopedics;  Laterality: Left;  Left Foot Amputation 4th and 5th toes at MTP joint   AMPUTATION  04/22/2011   Procedure: AMPUTATION BELOW KNEE;  Surgeon: Jerona LULLA Sage, MD;  Location: [facility] OR;  Service: Orthopedics;  Laterality: Left;  Left Below Knee Amputation   BIOPSY  07/17/2021   Procedure: BIOPSY;  Surgeon: Cindie Carlin POUR, DO;  Location: [facility] ENDO SUITE;  Service: Endoscopy;;   CATARACT EXTRACTION W/PHACO Left 01/09/2020   Procedure: CATARACT EXTRACTION PHACO AND INTRAOCULAR LENS PLACEMENT (IOC);  Surgeon: Harrie Agent, MD;  Location: [facility] ORS;  Service: Ophthalmology;  Laterality: Left;  CDE: 9.73   COLONOSCOPY WITH PROPOFOL  N/A 12/14/2021   Procedure: COLONOSCOPY WITH PROPOFOL ;  Surgeon: Cindie Carlin POUR, DO;  Location: [facility] ENDO SUITE;  Service: Endoscopy;  Laterality: N/A;  9:45 am   DILATION AND CURETTAGE OF UTERUS     EMBOLECTOMY  01/29/2011   Procedure: EMBOLECTOMY;  Surgeon: Lynwood JONETTA Collum, MD;  Location: [facility] OR;  Service: Vascular;  Laterality: Left;  Left Popliteal and Tibial Embolectomy with patch angioplasty   ESOPHAGOGASTRODUODENOSCOPY (EGD) WITH PROPOFOL  N/A 07/17/2021   Procedure: ESOPHAGOGASTRODUODENOSCOPY (EGD) WITH PROPOFOL ;  Surgeon: Cindie Carlin POUR, DO;  Location: [facility];  Service: Endoscopy;  Laterality: N/A;  1:00pm   INCISION AND DRAINAGE ABSCESS N/A 04/06/2016   Procedure: INCISION AND DRAINAGE ABSCESS;  Surgeon: Norleen Edsel GAILS, MD;  Location: [facility] ORS;  Service: Gynecology;  Laterality: N/A;   LEFT HEART CATHETERIZATION WITH CORONARY ANGIOGRAM N/A 10/01/2012   Procedure: LEFT HEART CATHETERIZATION WITH CORONARY ANGIOGRAM;  Surgeon: Peter M Swaziland, MD;  Location: [facility] CATH LAB;  Service: Cardiovascular;  Laterality: N/A;   LOWER EXTREMITY ANGIOGRAM N/A 01/27/2011   Procedure: LOWER EXTREMITY ANGIOGRAM;  Surgeon: Krystal JULIANNA Doing, MD;  Location: [facility] CATH LAB;  Service: Cardiovascular;  Laterality: N/A;   LOWER EXTREMITY ANGIOGRAM N/A 01/28/2011   Procedure: LOWER EXTREMITY ANGIOGRAM;  Surgeon: Redell LITTIE Door, MD;  Location: [facility] CATH LAB;  Service: Cardiovascular;  Laterality: N/A;   LOWER EXTREMITY ANGIOGRAM Left 01/29/2011   Procedure: LOWER EXTREMITY ANGIOGRAM;  Surgeon: Lynwood JONETTA Collum, MD;  Location: [facility] CATH LAB;  Service: Cardiovascular;  Laterality: Left;   MULTIPLE TOOTH EXTRACTIONS     POLYPECTOMY  12/14/2021   Procedure: POLYPECTOMY;  Surgeon: Cindie Carlin POUR, DO;  [facility];  Service: Endoscopy;;   TEE WITHOUT CARDIOVERSION  04/15/2011   Procedure: TRANSESOPHAGEAL ECHOCARDIOGRAM (TEE);  Surgeon: Lamar CHRISTELLA Rom, MD;  [facility];  Service: Cardiovascular;  Laterality: N/A;   WRIST SURGERY     Left    Social History   Socioeconomic History   Marital status: Single    Spouse name: Not on file   Number of children: 1   Years of education: Not on file   Highest education level: Not on file  Occupational History   Not on file  Tobacco Use   Smoking status: Every Day    Current packs/day: 0.50    Average packs/day: 0.5 packs/day for 34.1 years (17.0 ttl pk-yrs)    Types: Cigarettes    Start date: 07/06/1989   Smokeless tobacco: Never  Vaping Use   Vaping status: Never Used  Substance and Sexual Activity   Alcohol use: No    Alcohol/week: 0.0 standard drinks of alcohol    Comment: rarely   Drug use: Not Currently    Frequency: 1.0 times per week    Types: Marijuana   Sexual activity: Not Currently    Partners: Male    Comment: pt stated that her tubal was only 50% effective- not cut and burned  Other Topics Concern   Not on file  Social History Narrative   Unemployed.  Lives in [place] with [place], [place], and mother.  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   Social Drivers of Corporate investment banker Strain: Low Risk  (02/09/2023)   Received from  [facility]   Overall Financial Resource Strain (CARDIA)    Difficulty of Paying Living Expenses: Not hard at all  Food Insecurity: No Food Insecurity (02/09/2023)   Received from [facility]   Hunger Vital Sign    Within the past 12 months, you worried that your food would run out before you got the money to buy more.: Never true    Within the past 12 months, the food you bought just didn't last and you didn't have money  to get more.: Never true  Transportation Needs: No Transportation Needs (02/09/2023)   Received from [facility]   PRAPARE - Transportation    Lack of Transportation (Medical): No    Lack of Transportation (Non-Medical): No  Physical Activity: Unknown (12/23/2022)   Received from [facility]   Exercise Vital Sign    On average, how many days per week do you engage in moderate to strenuous exercise (like a brisk walk)?: 0 days    Minutes of Exercise per Session: Not on file  Recent Concern: Physical Activity - Inactive (11/11/2022)   Exercise Vital Sign    Days of Exercise per Week: 0 days    Minutes of Exercise per Session: 0 min  Stress: No Stress Concern Present (01/28/2023)   Received from [facility] of [facility]ational [facility] - Occupational Stress Questionnaire    Feeling of Stress : Not at all  Recent Concern: Stress - Stress Concern Present (12/23/2022)   Received from [facility] of Occupational Health - Occupational Stress Questionnaire    Feeling of Stress : To some extent  Social Connections: Moderately Integrated (12/23/2022)   Received from [facility]   Social Network    How would you rate your social network (family, work, friends)?: Adequate participation with social networks  Recent Concern: Social Connections - Socially Isolated (11/11/2022)   Social Connection and Isolation Panel    Frequency of Communication with Friends and Family: Once a week    Frequency of Social Gatherings with Friends and  Family: Once a week    Attends Religious Services: Never    Database administrator or Organizations: No    Attends Engineer, structural: Never    Marital Status: Never married    Family History  Problem Relation Age of Onset   COPD Mother    Hypertension Mother    Diabetes Mother    Stroke Mother    Coronary artery disease Mother    Diabetes Father    Hypertension Father    Coronary artery disease Father    Kidney disease Father        on dialysis, died from heart attack while being dialzed   Pancreatitis Maternal Aunt    Cancer Maternal Aunt        unknown primary   Anesthesia problems Neg Hx    Colon cancer Neg Hx     Outpatient Encounter Medications as of 08/01/2023  Medication Sig   acetaminophen  (TYLENOL ) 500 MG tablet Take 500 mg by mouth every 6 (six) hours as needed for moderate pain.   albuterol  (VENTOLIN  HFA) 108 (90 Base) MCG/ACT inhaler Inhale 2 puffs into the lungs every 6 (six) hours as needed for wheezing or shortness of breath.   atorvastatin  (LIPITOR ) 80 MG tablet TAKE ONE TABLET (80MG  TOTAL) BY MOUTH DAILY   calcium  carbonate (TUMS EX) 750 MG chewable tablet Chew 1 tablet by mouth daily as needed for heartburn.   carvedilol  (COREG ) 6.25 MG tablet Take 1 tablet (6.25 mg total) by mouth 2 (two) times daily with a meal.   Continuous Glucose Sensor (DEXCOM G7 SENSOR) MISC Inject 1 Application into the skin as directed. Change sensor every 10 days as directed.   ELIQUIS  2.5 MG TABS tablet TAKE ONE TABLET (2.5MG  TOTAL) BY MOUTH TWO TIMES DAILY   glucose blood (ACCU-CHEK GUIDE TEST) test strip Use as instructed to monitor glucose 4 times daily.  Use as back up to CGM.  insulin  aspart (NOVOLOG ) 100 UNIT/ML FlexPen Inject 1-7 Units into the skin 3 (three) times daily with meals.   insulin  glargine (LANTUS  SOLOSTAR) 100 UNIT/ML Solostar Pen Inject 20 Units into the skin at bedtime.   Insulin  Pen Needle (UNIFINE PENTIPS) 31G X 8 MM MISC USE TO INJECT INSULIN   SUBCUTANEOUSLY . USE FOR BOTH SLIDING SCALE AND FOR DAILYINSULIN DOSES.   levocetirizine (XYZAL ) 5 MG tablet Take 1 tablet (5 mg total) by mouth every evening.   metoCLOPramide  (REGLAN ) 5 MG tablet Take 1 tablet (5 mg total) by mouth every 8 (eight) hours as needed for nausea.   mirtazapine  (REMERON ) 15 MG tablet Take 15 mg by mouth at bedtime.   Misc. Devices MISC Wheelchair cushion - small.   Misc. Devices MISC Manual wheelchair - customized size.   Misc. Devices MISC Bedside commode - 1.   nitroGLYCERIN  (NITROSTAT ) 0.4 MG SL tablet PLACE ONE TABLET (0.4MG  TOTAL) UNDER THETONGUE EVERY FIVE MINUTES AS NEEDED FOR CHEST PAIN   omeprazole  (PRILOSEC) 20 MG capsule Take 1 capsule (20 mg total) by mouth daily.   oxyCODONE -acetaminophen  (PERCOCET) 7.5-325 MG tablet Take 1 tablet by mouth every 6 (six) hours as needed for severe pain (pain score 7-10). Up to 5 times daily.   simethicone (GAS-X) 80 MG chewable tablet Chew 80 mg by mouth every 6 (six) hours as needed for flatulence.   Spacer/Aero-Holding Chambers (PRO COMFORT SPACER ADULT) MISC Use it as directed for Albuterol  inhaler. Okay to dispense generic alternative preferred by insurance.   No facility-administered encounter medications on file as of 08/01/2023.    ALLERGIES: Allergies  Allergen Reactions   Losartan  Swelling   Nsaids     Kidney disease    VACCINATION STATUS: Immunization History  Administered Date(s) Administered   Influenza, Seasonal, Injecte, Preservative Fre 11/23/2018   Influenza,inj,Quad PF,6+ Mos 10/24/2014, 11/28/2019, 11/13/2020, 12/31/2021   Influenza-Unspecified 11/08/2011, 01/15/2013, 11/20/2015   Pneumococcal Polysaccharide-23 11/08/2016    Diabetes [name] presents for her follow-up diabetic visit. [name] has type 1 diabetes mellitus. Onset time: [name] was diagnosed at approximate age of 57. Her disease course has been fluctuating. There are no hypoglycemic associated symptoms. Associated symptoms include blurred  vision, fatigue and foot paresthesias. Pertinent negatives for diabetes include no polydipsia, no polyphagia, no polyuria and no weight loss. There are no hypoglycemic complications. (Has has several hypoglycemic events in the past where EMS was called.  [name] is extremely scared of becoming hypoglycemic again.) Symptoms are stable. Diabetic complications include a CVA, heart disease, nephropathy, peripheral neuropathy and PVD. Risk factors for coronary artery disease include diabetes mellitus, dyslipidemia, hypertension, sedentary lifestyle and tobacco exposure. Current diabetic treatment includes intensive insulin  program. [name] is compliant with treatment most of the time. Her weight is fluctuating minimally. [name] is following a generally unhealthy diet. When asked about meal planning, [name] reported none. [name] has not had a previous visit with a dietitian. [name] never (Has L AKA with prosthesis) participates in exercise. Her home blood glucose trend is fluctuating dramatically. Her overall blood glucose range is 180-200 mg/dl. ([name] presents today, accompanied by her daughter, with her glucose readings showing dramatically fluctuating glycemic profile.  Her POCT A1c today is 8.5% unchanged from last visit.  Analysis of her CGM shows TIR 42%, TAR 54%, TBR 4% with a GMI of 7.9%.  Daughter notes [name] has been waiting to see how high the spike goes before injecting the insulin , likely causing the dramatic drop soon after.) An ACE inhibitor/angiotensin II receptor blocker is not  being taken. [name] sees a podiatrist.Eye exam is current.  Hyperlipidemia This is a chronic problem. The current episode started more than 1 year ago. The problem is uncontrolled. Recent lipid tests were reviewed and are variable. Exacerbating diseases include chronic renal disease and diabetes. Factors aggravating her hyperlipidemia include beta blockers, smoking and fatty foods. Current antihyperlipidemic treatment includes statins. Compliance problems  include adherence to diet and adherence to exercise.  Risk factors for coronary artery disease include diabetes mellitus, dyslipidemia, hypertension and a sedentary lifestyle.  Hypertension This is a chronic problem. The current episode started more than 1 year ago. The problem has been waxing and waning since onset. The problem is uncontrolled. Associated symptoms include blurred vision. There are no associated agents to hypertension. Risk factors for coronary artery disease include diabetes mellitus, dyslipidemia, smoking/tobacco exposure and sedentary lifestyle. Past treatments include beta blockers. The current treatment provides mild improvement. Compliance problems include diet and exercise.  Hypertensive end-organ damage includes kidney disease, CAD/MI, CVA, heart failure and PVD. Identifiable causes of hypertension include chronic renal disease.    Review of systems  Constitutional: +increasing body weight-good development for her,  current Body mass index is 21.28 kg/m. 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), no xerophthalmia ENT: no sore throat, no nodules palpated in throat, no dysphagia/odynophagia, no hoarseness Cardiovascular: no chest pain, no shortness of breath, no palpitations, no leg swelling Respiratory: no cough, no shortness of breath Gastrointestinal: no nausea/vomiting/diarrhea Musculoskeletal: no muscle/joint aches, Hx of L AKA- with prosthesis Skin: no rashes, no hyperemia Neurological: no tremors, no numbness, no tingling, no dizziness Psychiatric: no depression, no anxiety   Objective:    BP 130/70 (BP Location: Left Arm, Patient Position: Sitting, Cuff Size: Large)   Pulse 98   Ht 5' 1 (1.549 m)   Wt 112 lb 9.6 oz (51.1 kg)   LMP 12/28/2010   BMI 21.28 kg/m   Wt Readings from Last 3 Encounters:  08/01/23 112 lb 9.6 oz (51.1 kg)  05/09/23 106 lb 6.4 oz  (48.3 kg)  03/31/23 105 lb 3.2 oz (47.7 kg)    BP Readings from Last 3 Encounters:  08/01/23 130/70  05/09/23 110/68  03/31/23 110/76    Physical Exam- Limited  Constitutional:  Body mass index is 21.28 kg/m. , not in acute distress, mildly anxious state of mind, tearful during visit, irritable Eyes:  EOMI, no exophthalmos Musculoskeletal: L AKA with new prosthesis, strength intact in all four extremities, no gross restriction of joint movements Skin:  no rashes, no hyperemia, + nicotinic discoloration to fingernails Neurological: no tremor with outstretched hands    CMP ( most recent) CMP     Component Value Date/Time   NA 131 (L) 02/24/2023 1651   NA 135 02/18/2022 1451   K 4.2 02/24/2023 1651   CL 96 (L) 02/24/2023 1651   CO2 21 (L) 02/24/2023 1651   GLUCOSE 135 (H) 02/24/2023 1651   BUN 40 (H) 02/24/2023 1651   BUN 58 (A) 08/09/2022 0000   CREATININE 4.61 (H) 02/24/2023 1651   CALCIUM  7.8 (L) 02/24/2023 1651   PROT 6.5 02/11/2022 1230   ALBUMIN  4.1 02/11/2022 1230   AST 17 02/11/2022 1230   ALT 14 02/11/2022 1230   ALKPHOS 110 02/11/2022 1230   BILITOT 0.3 02/11/2022 1230   GFRNONAA 11 (L) 02/24/2023 1651   GFRAA 28 (L) 09/28/2019 1456     Diabetic Labs (most recent):  Lab Results  Component Value Date   HGBA1C 8.5 (A) 08/01/2023   HGBA1C 8.5 (A) 03/31/2023   HGBA1C 9.1 (A) 11/23/2022     Lipid Panel ( most recent) Lipid Panel     Component Value Date/Time   CHOL 151 04/09/2021 1227   TRIG 75 04/09/2021 1227   HDL 59 04/09/2021 1227   CHOLHDL 2.6 04/09/2021 1227   CHOLHDL 4.2 11/07/2016 0722   VLDL 30 11/07/2016 0722   LDLCALC 77 04/09/2021 1227   LABVLDL 15 04/09/2021 1227       Assessment & Plan:   1) Type 1 diabetes mellitus with other circulatory complication (HCC)  [name] presents today, accompanied by her daughter, with her glucose readings showing dramatically fluctuating glycemic profile.  Her POCT A1c today is 8.5% unchanged from last  visit.  Analysis of her CGM shows TIR 42%, TAR 54%, TBR 4% with a GMI of 7.9%.  Daughter notes [name] has been waiting to see how high the spike goes before injecting the insulin , likely causing the dramatic drop soon after.  - [name] has currently uncontrolled symptomatic type 2 DM since 53 years of age.  Recent labs reviewed.  - I had a long discussion with her about the progressive nature of diabetes and the pathology behind its complications. -her diabetes is complicated by CAD, PVD, CKD, CVA, MI, DKA and [name] remains at a high risk for more acute and chronic complications which include retinopathy, and neuropathy. These are all discussed in detail with her.  - Nutritional counseling repeated at each appointment due to patients tendency to fall back in to old habits.  - The patient admits there is a room for improvement in their diet and drink choices. -  Suggestion is made for the patient to avoid simple carbohydrates from their diet including Cakes, Sweet Desserts / Pastries, Ice Cream, Soda (diet and regular), Sweet Tea, Candies, Chips, Cookies, Sweet Pastries, Store Bought Juices, Alcohol in Excess of 1-2 drinks a day, Artificial Sweeteners, Coffee Creamer, and Sugar-free Products. This will help patient to have stable blood glucose profile and potentially avoid unintended weight gain.   - I encouraged the patient to switch to unprocessed or minimally processed complex starch and increased protein intake (animal or plant source), fruits, and vegetables.   - Patient is advised to stick to a routine mealtimes to eat 3 meals a day and avoid unnecessary snacks (to snack only to correct hypoglycemia).  - [name] will be scheduled with Penny Crumpton, RDN, CDE for diabetes education.  - I have approached her with the following individualized plan to manage  her diabetes and patient agrees:    -[name] is advised to continue her Lantus  18 units SQ nightly (needs higher dose due to PD) and  Novolog  1-7 units TID BEFORE meals if glucose is above 90 and [name] is eating (Specific instructions on how to titrate insulin  dosage based on glucose readings given to patient in writing).    -[name] is encouraged to continue monitoring blood glucose 4 times per day using her CGM (which [name] was recently approved for), before meals and before bed, and notify the clinic if [name] has readings less than 70 or greater than 300 for 3 tests in a row.  [name] is benefiting from CGM device, is advised to continue using.  - [name] is warned not to take insulin  without proper monitoring per orders.  - Adjustment parameters are given to her for hypo and hyperglycemia in writing.  - Specific  targets for  A1c;  LDL, HDL,  and Triglycerides were discussed with the patient.  2) Blood Pressure /Hypertension:  Her blood pressure is controlled to target.  [name] is advised to continue Carvedilol  6.25 mg po twice daily and Hydralazine  12.5 mg po twice daily.  Will defer any med changes to nephrology.  3) Lipids/Hyperlipidemia:  Her most recent lipid panel from 04/09/21 shows controlled LDL of 77.5.  [name] is advised to continue Lipitor  80 mg po daily at bedtime.  Side effects and precautions discussed with her.  [name] is also advised to avoid fried foods and butter.  4)  Weight/Diet: Her Body mass index is 21.28 kg/m.  -  [name] is not a candidate for weight loss.  Exercise, and detailed carbohydrates information provided  -  detailed on discharge instructions.  5) Vitamin D  deficiency Her most recent vitamin D  level was 16.2 on 04/09/21.  [name] is not currently on supplementation.    6) Chronic Care/Health Maintenance: -[name] is on Statin medications and is encouraged to initiate and continue to follow up with Ophthalmology, Dentist,  Podiatrist at least yearly or according to recommendations, and advised to QUIT SMOKING. I have recommended yearly flu vaccine and pneumonia vaccine at least every 5 years; moderate intensity exercise for up  to 150 minutes weekly; and  sleep for at least 7 hours a day.  Smoking cessation instruction/counseling given:  counseled patient on the dangers of tobacco use, advised patient to stop smoking, and reviewed strategies to maximize success   - [name] is advised to maintain close follow up with Tobie Suzzane POUR, MD for primary care needs, as well as her other providers for optimal and coordinated care.     I spent  30  minutes in the care of the patient today including review of labs from CMP, Lipids, Thyroid Function, Hematology (current and previous including abstractions from other facilities); face-to-face time discussing  her blood glucose readings/logs, discussing hypoglycemia and hyperglycemia episodes and symptoms, medications doses, her options of short and long term treatment based on the latest standards of care / guidelines;  discussion about incorporating lifestyle medicine;  and documenting the encounter. Risk reduction counseling performed per USPSTF guidelines to reduce obesity and cardiovascular risk factors.     Please refer to Patient Instructions for Blood Glucose Monitoring and Insulin /Medications Dosing Guide  in media tab for additional information. Please  also refer to  Patient Self Inventory in the Media  tab for reviewed elements of pertinent patient history.  [name] participated in the discussions, expressed understanding, and voiced agreement with the above plans.  All questions were answered to her satisfaction. [name] is encouraged to contact clinic should [name] have any questions or concerns prior to her return visit.   Follow up plan: - Return in about 4 months (around 12/02/2023) for Diabetes F/U with A1c in office, No previsit labs, Bring meter and logs.   Benton Rio, [facility] [address] [place], [place] [address] Phone: [phone] Fax: [phone]  08/01/2023, 12:00 PM

## 2023-09-08 ENCOUNTER — Ambulatory Visit: Admitting: Family Medicine

## 2023-09-21 ENCOUNTER — Other Ambulatory Visit: Payer: Self-pay | Admitting: Nurse Practitioner

## 2023-10-26 ENCOUNTER — Ambulatory Visit: Admitting: Podiatry

## 2023-10-31 ENCOUNTER — Ambulatory Visit: Attending: Cardiology | Admitting: Cardiology

## 2023-10-31 ENCOUNTER — Encounter: Payer: Self-pay | Admitting: Cardiology

## 2023-10-31 VITALS — BP 128/64 | HR 99 | Ht 60.0 in | Wt 119.4 lb

## 2023-10-31 DIAGNOSIS — I5020 Unspecified systolic (congestive) heart failure: Secondary | ICD-10-CM | POA: Diagnosis not present

## 2023-10-31 DIAGNOSIS — I25119 Atherosclerotic heart disease of native coronary artery with unspecified angina pectoris: Secondary | ICD-10-CM | POA: Insufficient documentation

## 2023-10-31 DIAGNOSIS — N185 Chronic kidney disease, stage 5: Secondary | ICD-10-CM | POA: Diagnosis present

## 2023-10-31 NOTE — Patient Instructions (Addendum)

## 2023-10-31 NOTE — Progress Notes (Signed)
    Cardiology Office Note  Date: 10/31/2023   ID: ROANN MERK, DOB 11-15-1970, MRN [id number]  History of Present Illness: [name] is a 53 y.o. female last seen in February.  She is here with her daughter for a follow-up visit.  Reports no angina or interval nitroglycerin  use.  She has continued on peritoneal dialysis under direction of Dr. Rachele, overall seems to be doing very well with it.  I went over her medications.  She has come off both hydralazine  and carvedilol  due to hypotension.  Blood pressure more stable at this point.  She remains on Eliquis  and Lipitor .  Physical Exam: VS:  BP 128/64   Pulse 99   Ht 5' (1.524 m)   Wt 119 lb 6.4 oz (54.2 kg)   LMP 12/28/2010   SpO2 96%   BMI 23.32 kg/m , BMI Body mass index is 23.32 kg/m.  Wt Readings from Last 3 Encounters:  10/31/23 119 lb 6.4 oz (54.2 kg)  08/01/23 112 lb 9.6 oz (51.1 kg)  05/09/23 106 lb 6.4 oz (48.3 kg)    General: Patient appears comfortable at rest. HEENT: Conjunctiva and lids normal. Neck: Supple, no elevated JVP, left carotid bruit. Lungs: Clear to auscultation, nonlabored breathing at rest. Cardiac: Regular rate and rhythm, no S3, 1/6 systolic murmur. Abdomen: Peritoneal dialysis catheter in place. 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  ECG:  An ECG dated 03/01/2023 was personally reviewed today and demonstrated:  Sinus rhythm with ventricular bigeminy, left anterior fascicular block.  Labwork: 02/24/2023: BUN 40; Creatinine, Ser 4.61; Hemoglobin 10.9; Magnesium  2.2; Platelets 220; Potassium 4.2; Sodium 131  July 2025: Hemoglobin A1c 8.5%  Other Studies Reviewed Today:  No interval cardiac testing for review today.  Assessment and Plan:  1.  HFrecEF with ischemic cardiomyopathy, LVEF improved to the range of 55 to 60% by echocardiogram in December 2024, RV contraction also normal.  GDMT limited by CKD stage V, ARB allergy and relative hypotension.  Most recently  off of hydralazine  and then carvedilol .  No additions made today.   2.  CAD status post DES to the LAD in 2014.  No angina or interval nitroglycerin  use.  Continue Lipitor  80 mg daily.   3.  CKD stage V, creatinine 4.61 with GFR 11 in February.  She has biopsy-proven membranous glomerulopathy and follows with Dr. Rachele.  She continues on peritoneal dialysis.   4.  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  Continue Eliquis  2.5 mg twice daily.  Disposition:  Follow up 6 months.  Signed, Jayson JUDITHANN Sierras, M.D., F.A.C.C. [facility]

## 2023-11-10 ENCOUNTER — Other Ambulatory Visit (HOSPITAL_COMMUNITY): Payer: Self-pay

## 2023-11-10 ENCOUNTER — Telehealth: Payer: Self-pay

## 2023-11-10 ENCOUNTER — Encounter: Payer: Self-pay | Admitting: Nurse Practitioner

## 2023-11-10 NOTE — Telephone Encounter (Signed)
 Patient needs PA for Dexcom sensors.  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

## 2023-11-10 NOTE — Telephone Encounter (Signed)
 Pharmacy Patient Advocate Encounter   Received notification from Patient Advice Request messages that prior authorization for Dexcom G7 sensor is required/requested.   Insurance verification completed.   The patient is insured through [facility] MEDICAID.   Per test claim: PA required; PA submitted to above mentioned insurance via Latent Key/confirmation #/EOC [id number] Status is pending

## 2023-12-05 ENCOUNTER — Ambulatory Visit: Admitting: Nurse Practitioner

## 2023-12-27 ENCOUNTER — Emergency Department (HOSPITAL_COMMUNITY)

## 2023-12-27 ENCOUNTER — Observation Stay (HOSPITAL_COMMUNITY)
Admission: EM | Admit: 2023-12-27 | Discharge: 2023-12-29 | DRG: 073 | Disposition: A | Source: Ambulatory Visit | Attending: Internal Medicine | Admitting: Internal Medicine

## 2023-12-27 ENCOUNTER — Encounter (HOSPITAL_COMMUNITY): Payer: Self-pay | Admitting: Emergency Medicine

## 2023-12-27 ENCOUNTER — Other Ambulatory Visit: Payer: Self-pay

## 2023-12-27 DIAGNOSIS — E1143 Type 2 diabetes mellitus with diabetic autonomic (poly)neuropathy: Secondary | ICD-10-CM | POA: Diagnosis present

## 2023-12-27 DIAGNOSIS — I959 Hypotension, unspecified: Secondary | ICD-10-CM | POA: Diagnosis not present

## 2023-12-27 DIAGNOSIS — R1110 Vomiting, unspecified: Principal | ICD-10-CM | POA: Diagnosis present

## 2023-12-27 DIAGNOSIS — Z992 Dependence on renal dialysis: Secondary | ICD-10-CM

## 2023-12-27 DIAGNOSIS — Z8673 Personal history of transient ischemic attack (TIA), and cerebral infarction without residual deficits: Secondary | ICD-10-CM

## 2023-12-27 DIAGNOSIS — Z89512 Acquired absence of left leg below knee: Secondary | ICD-10-CM

## 2023-12-27 DIAGNOSIS — I429 Cardiomyopathy, unspecified: Secondary | ICD-10-CM

## 2023-12-27 DIAGNOSIS — I2510 Atherosclerotic heart disease of native coronary artery without angina pectoris: Secondary | ICD-10-CM | POA: Diagnosis present

## 2023-12-27 DIAGNOSIS — I10 Essential (primary) hypertension: Secondary | ICD-10-CM | POA: Diagnosis present

## 2023-12-27 DIAGNOSIS — Z7401 Bed confinement status: Secondary | ICD-10-CM | POA: Diagnosis not present

## 2023-12-27 DIAGNOSIS — K920 Hematemesis: Secondary | ICD-10-CM | POA: Diagnosis not present

## 2023-12-27 LAB — COMPREHENSIVE METABOLIC PANEL WITH GFR
ALT: 21 U/L (ref 0–44)
AST: 25 U/L (ref 15–41)
Albumin: 3.7 g/dL (ref 3.5–5.0)
Alkaline Phosphatase: 124 U/L (ref 38–126)
Anion gap: 26 — ABNORMAL HIGH (ref 5–15)
BUN: 46 mg/dL — ABNORMAL HIGH (ref 6–20)
CO2: 23 mmol/L (ref 22–32)
Calcium: 9.9 mg/dL (ref 8.9–10.3)
Chloride: 91 mmol/L — ABNORMAL LOW (ref 98–111)
Creatinine, Ser: 15.2 mg/dL — ABNORMAL HIGH (ref 0.44–1.00)
GFR, Estimated: 3 mL/min — ABNORMAL LOW (ref >60)
Glucose, Bld: 178 mg/dL — ABNORMAL HIGH (ref 70–99)
Potassium: 3.9 mmol/L (ref 3.5–5.1)
Sodium: 139 mmol/L (ref 135–145)
Total Bilirubin: 0.4 mg/dL (ref 0.0–1.2)
Total Protein: 7.8 g/dL (ref 6.5–8.1)

## 2023-12-27 LAB — LIPASE, BLOOD: Lipase: <10 U/L — ABNORMAL LOW (ref 11–51)

## 2023-12-27 LAB — CBC
HCT: 46.2 % — ABNORMAL HIGH (ref 36.0–46.0)
Hemoglobin: 14.9 g/dL (ref 12.0–15.0)
MCH: 31.4 pg (ref 26.0–34.0)
MCHC: 32.3 g/dL (ref 30.0–36.0)
MCV: 97.3 fL (ref 80.0–100.0)
Platelets: 502 K/uL — ABNORMAL HIGH (ref 150–400)
RBC: 4.75 MIL/uL (ref 3.87–5.11)
RDW: 14.2 % (ref 11.5–15.5)
WBC: 13.9 K/uL — ABNORMAL HIGH (ref 4.0–10.5)
nRBC: 0 % (ref 0.0–0.2)

## 2023-12-27 LAB — GLUCOSE, CAPILLARY: Glucose-Capillary: 203 mg/dL — ABNORMAL HIGH (ref 70–99)

## 2023-12-27 MED ORDER — ONDANSETRON HCL 4 MG/2ML IJ SOLN
4.0000 mg | Freq: Once | INTRAMUSCULAR | Status: AC
  Administered 2023-12-27: 4 mg via INTRAVENOUS
  Filled 2023-12-27: qty 2

## 2023-12-27 MED ORDER — ALPRAZOLAM 0.5 MG PO TABS
0.5000 mg | ORAL_TABLET | Freq: Every day | ORAL | Status: AC
  Administered 2023-12-27: 0.5 mg via ORAL
  Filled 2023-12-27: qty 1

## 2023-12-27 MED ORDER — MIRTAZAPINE 15 MG PO TABS
15.0000 mg | ORAL_TABLET | Freq: Every day | ORAL | Status: DC
  Administered 2023-12-27 – 2023-12-28 (×2): 15 mg via ORAL
  Filled 2023-12-27 (×2): qty 1

## 2023-12-27 MED ORDER — SODIUM CHLORIDE 0.9 % IV BOLUS
250.0000 mL | Freq: Once | INTRAVENOUS | Status: AC
  Administered 2023-12-27: 250 mL via INTRAVENOUS

## 2023-12-27 MED ORDER — ACETAMINOPHEN 325 MG PO TABS
650.0000 mg | ORAL_TABLET | Freq: Four times a day (QID) | ORAL | Status: DC | PRN
  Administered 2023-12-27: 650 mg via ORAL
  Filled 2023-12-27: qty 2

## 2023-12-27 MED ORDER — INSULIN ASPART 100 UNIT/ML IJ SOLN
0.0000 [IU] | Freq: Four times a day (QID) | INTRAMUSCULAR | Status: DC
  Administered 2023-12-27: 3 [IU] via SUBCUTANEOUS
  Administered 2023-12-28: 1 [IU] via SUBCUTANEOUS
  Filled 2023-12-27: qty 3
  Filled 2023-12-27: qty 1

## 2023-12-27 MED ORDER — INSULIN GLARGINE 100 UNIT/ML ~~LOC~~ SOLN
10.0000 [IU] | Freq: Every day | SUBCUTANEOUS | Status: DC
  Filled 2023-12-27 (×3): qty 0.1

## 2023-12-27 MED ORDER — APIXABAN 2.5 MG PO TABS
2.5000 mg | ORAL_TABLET | Freq: Two times a day (BID) | ORAL | Status: DC
  Administered 2023-12-27 – 2023-12-29 (×4): 2.5 mg via ORAL
  Filled 2023-12-27 (×4): qty 1

## 2023-12-27 MED ORDER — PANTOPRAZOLE SODIUM 40 MG IV SOLR
40.0000 mg | Freq: Once | INTRAVENOUS | Status: AC
  Administered 2023-12-27: 40 mg via INTRAVENOUS
  Filled 2023-12-27: qty 10

## 2023-12-27 MED ORDER — ONDANSETRON HCL 4 MG/2ML IJ SOLN: 4.0000 mg | Freq: Once | INTRAMUSCULAR | Status: AC

## 2023-12-27 MED ORDER — SODIUM CHLORIDE 0.9 % IV BOLUS
500.0000 mL | Freq: Once | INTRAVENOUS | Status: AC
  Administered 2023-12-27: 500 mL via INTRAVENOUS

## 2023-12-27 MED ORDER — ACETAMINOPHEN 650 MG RE SUPP: 650.0000 mg | Freq: Four times a day (QID) | RECTAL | Status: DC | PRN

## 2023-12-27 MED ORDER — PANTOPRAZOLE SODIUM 40 MG IV SOLR
40.0000 mg | INTRAVENOUS | Status: DC
  Administered 2023-12-28 – 2023-12-29 (×2): 40 mg via INTRAVENOUS
  Filled 2023-12-27 (×2): qty 10

## 2023-12-27 MED ORDER — ONDANSETRON HCL 4 MG/2ML IJ SOLN: 4.0000 mg | Freq: Four times a day (QID) | INTRAMUSCULAR | Status: DC | PRN

## 2023-12-27 MED ORDER — ONDANSETRON HCL 4 MG PO TABS: 4.0000 mg | ORAL_TABLET | Freq: Four times a day (QID) | ORAL | Status: DC | PRN

## 2023-12-27 MED ORDER — POLYETHYLENE GLYCOL 3350 17 G PO PACK: 17.0000 g | PACK | Freq: Every day | ORAL | Status: DC | PRN

## 2023-12-27 MED ORDER — ONDANSETRON HCL 4 MG/2ML IJ SOLN
INTRAMUSCULAR | Status: AC
  Administered 2023-12-27: 4 mg via INTRAVENOUS
  Filled 2023-12-27: qty 2

## 2023-12-27 NOTE — Progress Notes (Addendum)
 Brief Note  Informed of ESRD/PD patient at [facility]. Presents with ongoing nausea/vomiting. CT unrevealing in the ER. Followed by Dr. Rachele at [facility]. Plan: -to be transferred to [facility] given that she is PD. Will hold off on ordering PD for tonight--flowsheet reviewed and sat'ing well on RA, K WNL. -once she gets to [facility], will arrange for cell count and culture to assess for peritonitis--will likely need to happen in AM depending on timing of arrival [facility] -will be seen tomorrow AM -please call with any questions/concerns in the interim  Ephriam Stank, MD [facility]

## 2023-12-27 NOTE — ED Triage Notes (Signed)
 Pt in by pov with family who states they were sent by her kidney doctor. Pt has been vomiting for 2 weeks and had hypotension today in  the doctors office. Pt does home dialysis and has not missed any treatments.

## 2023-12-27 NOTE — ED Provider Notes (Signed)
 [place] AT [facility] Provider Note   CSN: [id number] Arrival date & time: 12/27/23  1430     Patient presents with: Emesis   [name] is a 53 y.o. female.  {Add pertinent medical, surgical, social history, OB history to YEP:67052} Patient has a history of diabetes and renal failure.  She gets peritoneal dialysis every day but Wednesday and Saturday.   Emesis      Prior to Admission medications   Medication Sig Start Date End Date Taking? Authorizing Provider  acetaminophen  (TYLENOL ) 500 MG tablet Take 500 mg by mouth every 6 (six) hours as needed for moderate pain.    [provider]  albuterol  (VENTOLIN  HFA) 108 (90 Base) MCG/ACT inhaler Inhale 2 puffs into the lungs every 6 (six) hours as needed for wheezing or shortness of breath. 02/08/23   Tobie Suzzane POUR, MD  atorvastatin  (LIPITOR ) 80 MG tablet TAKE ONE TABLET (80MG  TOTAL) BY MOUTH DAILY 01/20/23   Debera Jayson MATSU, MD  calcium  carbonate (TUMS EX) 750 MG chewable tablet Chew 1 tablet by mouth daily as needed for heartburn.    [provider]  Continuous Glucose Sensor (DEXCOM G7 SENSOR) MISC Inject 1 Application into the skin as directed. Change sensor every 10 days as directed. 05/09/23   Therisa Benton PARAS, NP  ELIQUIS  2.5 MG TABS tablet TAKE ONE TABLET (2.5MG  TOTAL) BY MOUTH TWO TIMES DAILY 07/28/23   Debera Jayson MATSU, MD  glucose blood (ACCU-CHEK GUIDE TEST) test strip Use as instructed to monitor glucose 4 times daily.  Use as back up to CGM. 05/10/23   Therisa Benton PARAS, NP  insulin  aspart (NOVOLOG ) 100 UNIT/ML FlexPen Inject 1-7 Units into the skin 3 (three) times daily with meals. 08/23/22   Therisa Benton PARAS, NP  insulin  glargine (LANTUS  SOLOSTAR) 100 UNIT/ML Solostar Pen Inject 20 Units into the skin at bedtime. 01/13/23   Therisa Benton PARAS, NP  Insulin  Pen Needle (UNIFINE PENTIPS) 31G X 8 MM MISC USE TO INJECT INSULIN  SUBCUTANEOUSLY. USE FOR SLIDING SCALE AND  FOR DAILY INSULIN  DOSES. 09/21/23   Therisa Benton PARAS, NP  levocetirizine (XYZAL ) 5 MG tablet Take 1 tablet (5 mg total) by mouth every evening. 05/09/23   Tobie Suzzane POUR, MD  metoCLOPramide  (REGLAN ) 5 MG tablet Take 1 tablet (5 mg total) by mouth every 8 (eight) hours as needed for nausea. 01/06/23   Tobie Suzzane POUR, MD  mirtazapine  (REMERON ) 15 MG tablet Take 15 mg by mouth at bedtime.    [provider]  Misc. Devices MISC Wheelchair cushion - small. 12/31/21   Tobie Suzzane POUR, MD  Misc. Devices MISC Manual wheelchair - customized size. 05/09/23   Tobie Suzzane POUR, MD  Misc. Devices MISC Bedside commode - 1. 05/09/23   Tobie Suzzane POUR, MD  nitroGLYCERIN  (NITROSTAT ) 0.4 MG SL tablet PLACE ONE TABLET (0.4MG  TOTAL) UNDER THETONGUE EVERY FIVE MINUTES AS NEEDED FOR CHEST PAIN 11/11/22   Debera Jayson MATSU, MD  omeprazole  (PRILOSEC) 20 MG capsule Take 1 capsule (20 mg total) by mouth daily. 05/09/23   Tobie Suzzane POUR, MD  oxyCODONE  (OXY IR/ROXICODONE ) 5 MG immediate release tablet Take 5 mg by mouth every 6 (six) hours as needed for moderate pain (pain score 4-6). 08/24/23   [provider]  oxyCODONE -acetaminophen  (PERCOCET) 7.5-325 MG tablet Take 1 tablet by mouth every 6 (six) hours as needed for severe pain (pain score 7-10). Up to 5 times daily.    [provider]  simethicone (GAS-X) 80 MG chewable tablet Chew 80 mg by mouth every 6 (six) hours as needed for flatulence.    [provider]  Spacer/Aero-Holding Chambers (PRO COMFORT SPACER ADULT) MISC Use it as directed for Albuterol  inhaler. Okay to dispense generic alternative preferred by insurance. 02/18/23   Tobie Suzzane POUR, MD    Allergies: Losartan  and Nsaids    Review of Systems  Gastrointestinal:  Positive for vomiting.    Updated Vital Signs BP (!) 108/59   Pulse 96   Temp (!) 97.5 F (36.4 C) (Oral)   Resp 18   Ht 5' (1.524 m)   Wt 51 kg   LMP 12/28/2010   SpO2 99%   BMI 21.96 kg/m   Physical  Exam  (all labs ordered are listed, but only abnormal results are displayed) Labs Reviewed  LIPASE, BLOOD - Abnormal; Notable for the following components:      Result Value   Lipase <10 (*)    All other components within normal limits  COMPREHENSIVE METABOLIC PANEL WITH GFR - Abnormal; Notable for the following components:   Chloride 91 (*)    Glucose, Bld 178 (*)    BUN 46 (*)    Creatinine, Ser 15.20 (*)    GFR, Estimated 3 (*)    Anion gap 26 (*)    All other components within normal limits  CBC - Abnormal; Notable for the following components:   WBC 13.9 (*)    HCT 46.2 (*)    Platelets 502 (*)    All other components within normal limits  URINALYSIS, ROUTINE W REFLEX MICROSCOPIC    EKG: None  Radiology: CT ABDOMEN PELVIS WO CONTRAST Result Date: 12/27/2023 CLINICAL DATA:  Abdominal pain and vomiting for 2 weeks. Renal failure on peritoneal dialysis. EXAM: CT ABDOMEN AND PELVIS WITHOUT CONTRAST TECHNIQUE: Multidetector CT imaging of the abdomen and pelvis was performed following the standard protocol without IV contrast. RADIATION DOSE REDUCTION: This exam was performed according to the departmental dose-optimization program which includes automated exposure control, adjustment of the mA and/or kV according to patient size and/or use of iterative reconstruction technique. COMPARISON:  02/04/2022 FINDINGS: Lower chest: No acute findings. Hepatobiliary: No mass visualized on this unenhanced exam. Gallbladder is unremarkable. No evidence of biliary ductal dilatation. Pancreas: No mass or inflammatory process visualized on this unenhanced exam. Spleen:  Within normal limits in size. Adrenals/Urinary tract: Extensive bilateral renal vascular calcification noted. No definite evidence of urolithiasis, or hydronephrosis. Small fluid attenuation cyst again seen in posterior upper pole of right kidney. Bladder is empty. Peritoneal dialysis catheter seen in the pelvis. Stomach/Bowel: No  evidence of obstruction, inflammatory process, or abnormal fluid collections. Normal appendix visualized. Vascular/Lymphatic: No pathologically enlarged lymph nodes identified. No evidence of abdominal aortic aneurysm. Reproductive:  No mass or other significant abnormality. Other:  None. Musculoskeletal:  No suspicious bone lesions identified. IMPRESSION: No acute findings or other significant abnormality. Peritoneal dialysis catheter noted. Electronically Signed   By: Norleen DELENA Kil M.D.   On: 12/27/2023 16:27    {Document cardiac monitor, telemetry assessment procedure when appropriate:32947} Procedures   Medications Ordered in the ED  pantoprazole  (PROTONIX ) injection 40 mg (40 mg Intravenous Given 12/27/23 1608)  ondansetron  (ZOFRAN ) injection 4 mg (4 mg Intravenous Given 12/27/23 1608)  sodium chloride  0.9 % bolus 500 mL (500 mLs Intravenous New Bag/Given 12/27/23 1608)   Patient with persistent vomiting.  And chronic renal failure on peritoneal dialysis.  I spoke with Dr. Dennise and he recommended  the patient be admitted to [facility] and nephrology will see her tomorrow.  No dialysis tonight   {Click here for ABCD2, HEART and other calculators REFRESH Note before signing:1}                              Medical Decision Making Amount and/or Complexity of Data Reviewed Labs: ordered. Radiology: ordered.  Risk Prescription drug management. Decision regarding hospitalization.   Persistent vomiting and peritoneal dialysis patient  {Document critical care time when appropriate  Document review of labs and clinical decision tools ie CHADS2VASC2, etc  Document your independent review of radiology images and any outside records  Document your discussion with family members, caretakers and with consultants  Document social determinants of health affecting pt's care  Document your decision making why or why not admission, treatments were needed:32947:::1}   Final diagnoses:   Intractable vomiting    ED Discharge Orders     None

## 2023-12-27 NOTE — ED Notes (Signed)
 Emokpae notified of pt BP trending low in the 80s. Small bolus ordered. Pt daughter also states pt sugar was dropping and was 72 so a small soda was given to pt.

## 2023-12-27 NOTE — H&P (Signed)
 History and Physical    [name] FMW:[id number] DOB: 23-Nov-1970 DOA: 12/27/2023  PCP: [facility]   Patient coming from: Home  I have personally briefly reviewed patient's old medical records in [facility]  Chief Complaint: Vomiting, low blood pressure  HPI: [name] is a 53 y.o. female with medical history significant for ESRD on peritoneal dialysis, left below-knee amputation, cardiomyopathy, diabetes mellitus, stroke.\ Patient presented to the ED with complaints of vomiting at least 3 times daily over the past 2 weeks.  She has not been able to keep anything down.  Reports to 2 episodes of watery stools over the past 2 days, but daughter reports she gave patient extra MiraLAX  as she had not had a bowel movement, stools last night.  No abdominal pain.  No fevers no chills. Daughter reports patient's systolic blood pressure was in the 80s at home earlier today, and when she went to her doctor's office it was in the 70s, so she was sent to the hospital. Patient has been compliant with her peritoneal dialysis-5 days a week, gets any 2 days in the week off, -usually Wednesday and Saturdays.    ED Course: Temperature 97.5.  Heart rate 96.  Respirate rate 15-18.  Blood pressure 80s-108.  O2 sats greater 94% on room air. WBC 13.9. CT abdomen pelvis without contrast without acute abnormality. EDP talked to nephrologist Dr. Dennise, recommended admission to [facility], as patient is on peritoneal dialysis.  Review of Systems: As per HPI all other systems reviewed and negative.  Past Medical History:  Diagnosis Date   Acquired absence of left leg below knee (HCC)    Anxiety    was on xanax  for years but discontinued prior to prior PCP retiring as she was on opioids and was told she would not find someone to write for both   Arterial thromboembolism (HCC)    Prior embolectomy, previously on Coumadin    Cataract    Phreesia 11/19/2019   Chronic kidney disease     Phreesia 11/19/2019   Clotting disorder    Phreesia 11/19/2019   Coronary atherosclerosis of native coronary artery    a. s/p DES to LAD in 2014   Depression    Depression    Phreesia 11/19/2019   Diabetes mellitus without complication (HCC)    Phreesia 11/19/2019   Diabetic ketoacidosis with coma associated with type 1 diabetes mellitus (HCC)    DKA, type 1 (HCC) 11/17/2016   Facial cellulitis    12/2010   Glomerulonephritis    Headache(784.0)    Hemiplegia, unspecified affecting right dominant side (HCC)    History of stroke    Hyperlipidemia    Insulin  dependent diabetes mellitus    History of diabetic ketoacidosis   Ischemic cardiomyopathy    a. EF 30-35% in 2018 b. EF at 45-50% by repeat imaging in 02/2019   Left carotid artery occlusion    Major depressive disorder, single episode, unspecified    N&V (nausea and vomiting) 06/03/2021   Pancreatitis, acute 11/07/2016   Peripheral arterial disease    Shingles    11/2010   ST elevation myocardial infarction (STEMI) of anterior wall [facility])    Late presentation September 2014   Stroke [facility])    2018   Upper abdominal pain 06/03/2021    Past Surgical History:  Procedure Laterality Date   AMPUTATION  03/03/2011   Procedure: AMPUTATION DIGIT;  Surgeon: Jerona LULLA Sage, MD;  Location: [facility] OR;  Service: Orthopedics;  Laterality:  Left;  Left Foot Amputation 4th and 5th toes at MTP joint   AMPUTATION  04/22/2011   Procedure: AMPUTATION BELOW KNEE;  Surgeon: Jerona LULLA Sage, MD;  Location: [facility] OR;  Service: Orthopedics;  Laterality: Left;  Left Below Knee Amputation   BIOPSY  07/17/2021   Procedure: BIOPSY;  Surgeon: Cindie Carlin POUR, DO;  Location: [facility] ENDO SUITE;  Service: Endoscopy;;   CATARACT EXTRACTION W/PHACO Left 01/09/2020   Procedure: CATARACT EXTRACTION PHACO AND INTRAOCULAR LENS PLACEMENT (IOC);  Surgeon: Harrie Agent, MD;  Location: [facility] ORS;  Service: Ophthalmology;  Laterality: Left;  CDE: 9.73   COLONOSCOPY WITH PROPOFOL   N/A 12/14/2021   Procedure: COLONOSCOPY WITH PROPOFOL ;  Surgeon: Cindie Carlin POUR, DO;  Location: [facility] ENDO SUITE;  Service: Endoscopy;  Laterality: N/A;  9:45 am   DILATION AND CURETTAGE OF UTERUS     EMBOLECTOMY  01/29/2011   Procedure: EMBOLECTOMY;  Surgeon: Agent JONETTA Collum, MD;  Location[facility] OR;  Service: Vascular;  Laterality: Left;  Left Popliteal and Tibial Embolectomy with patch angioplasty   ESOPHAGOGASTRODUODENOSCOPY (EGD) WITH PROPOFOL  N/A 07/17/2021   Procedure: ESOPHAGOGASTRODUODENOSCOPY (EGD) WITH PROPOFOL ;  Surgeon: Cindie Carlin POUR, DO;  Location: [facility];  Service: Endoscopy;  Laterality: N/A;  1:00pm   INCISION AND DRAINAGE ABSCESS N/A 04/06/2016   Procedure: INCISION AND DRAINAGE ABSCESS;  Surgeon: Norleen Edsel LULLA, MD;  Location: [facility] ORS;  Service: Gynecology;  Laterality: N/A;   LEFT HEART CATHETERIZATION WITH CORONARY ANGIOGRAM N/A 10/01/2012   Procedure: LEFT HEART CATHETERIZATION WITH CORONARY ANGIOGRAM;  Surgeon: Peter M Jordan, MD;  Location[facility] CATH LAB;  Service: Cardiovascular;  Laterality: N/A;   LOWER EXTREMITY ANGIOGRAM N/A 01/27/2011   Procedure: LOWER EXTREMITY ANGIOGRAM;  Surgeon: Krystal JULIANNA Doing, MD;  Location: [facility] CATH LAB;  Service: Cardiovascular;  Laterality: N/A;   LOWER EXTREMITY ANGIOGRAM N/A 01/28/2011   Procedure: LOWER EXTREMITY ANGIOGRAM;  Surgeon: Redell LITTIE Door, MD;  Location: [facility] CATH LAB;  Service: Cardiovascular;  Laterality: N/A;   LOWER EXTREMITY ANGIOGRAM Left 01/29/2011   Procedure: LOWER EXTREMITY ANGIOGRAM;  Surgeon: Agent JONETTA Collum, MD;  Location: [facility] CATH LAB;  Service: Cardiovascular;  Laterality: Left;   MULTIPLE TOOTH EXTRACTIONS     POLYPECTOMY  12/14/2021   Procedure: POLYPECTOMY;  Surgeon: Cindie Carlin POUR, DO;  [facility];  Service: Endoscopy;;   TEE WITHOUT CARDIOVERSION  04/15/2011   Procedure: TRANSESOPHAGEAL ECHOCARDIOGRAM (TEE);  Surgeon: Lamar CHRISTELLA Rom, MD;  [facility];  Service: Cardiovascular;  Laterality:  N/A;   WRIST SURGERY     Left     reports that she has been smoking cigarettes. She started smoking about 34 years ago. She has a 17.2 pack-year smoking history. She has never used smokeless tobacco. She reports current drug use. Frequency: 1.00 time per week. Drug: Marijuana. She reports that she does not drink alcohol.  Allergies  Allergen Reactions   Losartan  Swelling   Nsaids Other (See Comments)    Kidney disease    Family History  Problem Relation Age of Onset   COPD Mother    Hypertension Mother    Diabetes Mother    Stroke Mother    Coronary artery disease Mother    Diabetes Father    Hypertension Father    Coronary artery disease Father    Kidney disease Father        on dialysis, died from heart attack while being dialzed   Pancreatitis Maternal Aunt    Cancer Maternal Aunt  unknown primary   Anesthesia problems Neg Hx    Colon cancer Neg Hx     Prior to Admission medications   Medication Sig Start Date End Date Taking? Authorizing Provider  acetaminophen  (TYLENOL ) 500 MG tablet Take 500 mg by mouth every 6 (six) hours as needed for moderate pain.    [provider]  albuterol  (VENTOLIN  HFA) 108 (90 Base) MCG/ACT inhaler Inhale 2 puffs into the lungs every 6 (six) hours as needed for wheezing or shortness of breath. 02/08/23   Tobie Suzzane POUR, MD  atorvastatin  (LIPITOR ) 80 MG tablet TAKE ONE TABLET (80MG  TOTAL) BY MOUTH DAILY 01/20/23   Debera Jayson MATSU, MD  calcium  carbonate (TUMS EX) 750 MG chewable tablet Chew 1 tablet by mouth daily as needed for heartburn.    [provider]  Continuous Glucose Sensor (DEXCOM G7 SENSOR) MISC Inject 1 Application into the skin as directed. Change sensor every 10 days as directed. 05/09/23   Therisa Benton PARAS, NP  ELIQUIS  2.5 MG TABS tablet TAKE ONE TABLET (2.5MG  TOTAL) BY MOUTH TWO TIMES DAILY 07/28/23   Debera Jayson MATSU, MD  glucose blood (ACCU-CHEK GUIDE TEST) test strip Use as instructed to monitor  glucose 4 times daily.  Use as back up to CGM. 05/10/23   Therisa Benton PARAS, NP  insulin  aspart (NOVOLOG ) 100 UNIT/ML FlexPen Inject 1-7 Units into the skin 3 (three) times daily with meals. 08/23/22   Therisa Benton PARAS, NP  insulin  glargine (LANTUS  SOLOSTAR) 100 UNIT/ML Solostar Pen Inject 20 Units into the skin at bedtime. 01/13/23   Therisa Benton PARAS, NP  Insulin  Pen Needle (UNIFINE PENTIPS) 31G X 8 MM MISC USE TO INJECT INSULIN  SUBCUTANEOUSLY. USE FOR SLIDING SCALE AND FOR DAILY INSULIN  DOSES. 09/21/23   Therisa Benton PARAS, NP  levocetirizine (XYZAL ) 5 MG tablet Take 1 tablet (5 mg total) by mouth every evening. 05/09/23   Tobie Suzzane POUR, MD  metoCLOPramide  (REGLAN ) 5 MG tablet Take 1 tablet (5 mg total) by mouth every 8 (eight) hours as needed for nausea. 01/06/23   Tobie Suzzane POUR, MD  mirtazapine  (REMERON ) 15 MG tablet Take 15 mg by mouth at bedtime.    [provider]  Misc. Devices MISC Wheelchair cushion - small. 12/31/21   Tobie Suzzane POUR, MD  Misc. Devices MISC Manual wheelchair - customized size. 05/09/23   Tobie Suzzane POUR, MD  Misc. Devices MISC Bedside commode - 1. 05/09/23   Tobie Suzzane POUR, MD  nitroGLYCERIN  (NITROSTAT ) 0.4 MG SL tablet PLACE ONE TABLET (0.4MG  TOTAL) UNDER THETONGUE EVERY FIVE MINUTES AS NEEDED FOR CHEST PAIN 11/11/22   Debera Jayson MATSU, MD  omeprazole  (PRILOSEC) 20 MG capsule Take 1 capsule (20 mg total) by mouth daily. 05/09/23   Tobie Suzzane POUR, MD  oxyCODONE  (OXY IR/ROXICODONE ) 5 MG immediate release tablet Take 5 mg by mouth every 6 (six) hours as needed for moderate pain (pain score 4-6). 08/24/23   [provider]  oxyCODONE -acetaminophen  (PERCOCET) 7.5-325 MG tablet Take 1 tablet by mouth every 6 (six) hours as needed for severe pain (pain score 7-10). Up to 5 times daily.    [provider]  simethicone (GAS-X) 80 MG chewable tablet Chew 80 mg by mouth every 6 (six) hours as needed for flatulence.    [provider]   Spacer/Aero-Holding Chambers (PRO COMFORT SPACER ADULT) MISC Use it as directed for Albuterol  inhaler. Okay to dispense generic alternative preferred by insurance. 02/18/23   Tobie Suzzane POUR, MD  Physical Exam: Vitals:   12/27/23 1500 12/27/23 1501 12/27/23 1506  BP: (!) 108/59    Pulse:   96  Resp: 15  18  Temp:   (!) 97.5 F (36.4 C)  TempSrc:   Oral  SpO2:   99%  Weight:  51 kg   Height:  5' (1.524 m)     Constitutional: Patient is quite anxious, crying, initially saying she did not want to be admitted. Vitals:   12/27/23 1500 12/27/23 1501 12/27/23 1506  BP: (!) 108/59    Pulse:   96  Resp: 15  18  Temp:   (!) 97.5 F (36.4 C)  TempSrc:   Oral  SpO2:   99%  Weight:  51 kg   Height:  5' (1.524 m)    Eyes: PERRL, lids and conjunctivae normal ENMT: Mucous membranes are dry   Neck: normal, supple, no masses, no thyromegaly Respiratory: clear to auscultation bilaterally, no wheezing, no crackles. Normal respiratory effort. No accessory muscle use.  Cardiovascular: Regular rate and rhythm, no murmurs / rubs / gallops. No extremity edema.  Extremities warm Abdomen: no tenderness, peritoneal dialysis catheter right side of abdomen present.  No masses palpated. No hepatosplenomegaly.  Musculoskeletal: no clubbing / cyanosis. No joint deformity upper and lower extremities.  Skin: no rashes, lesions, ulcers. No induration Neurologic: No facial asymmetry, moves extremity spontaneously, speech fluent.SABRA  Psychiatric: Normal judgment and insight. Alert and oriented x 3. Normal mood.   Labs on Admission: I have personally reviewed following labs and imaging studies  CBC: Recent Labs  Lab 12/27/23 1537  WBC 13.9*  HGB 14.9  HCT 46.2*  MCV 97.3  PLT 502*   Basic Metabolic Panel: Recent Labs  Lab 12/27/23 1537  NA 139  K 3.9  CL 91*  CO2 23  GLUCOSE 178*  BUN 46*  CREATININE 15.20*  CALCIUM  9.9   GFR: Estimated Creatinine Clearance: 3 mL/min (A) (by C-G formula  based on SCr of 15.2 mg/dL (H)). Liver Function Tests: Recent Labs  Lab 12/27/23 1537  AST 25  ALT 21  ALKPHOS 124  BILITOT 0.4  PROT 7.8  ALBUMIN  3.7   Recent Labs  Lab 12/27/23 1537  LIPASE <10*   Urine analysis:    Component Value Date/Time   COLORURINE YELLOW 02/24/2023 1717   APPEARANCEUR CLEAR 02/24/2023 1717   LABSPEC 1.025 02/24/2023 1717   PHURINE 5.5 02/24/2023 1717   GLUCOSEU NEGATIVE 02/24/2023 1717   HGBUR NEGATIVE 02/24/2023 1717   BILIRUBINUR NEGATIVE 02/24/2023 1717   BILIRUBINUR negative 09/18/2021 1509   BILIRUBINUR neg 01/30/2019 1502   KETONESUR NEGATIVE 02/24/2023 1717   PROTEINUR >300 (A) 02/24/2023 1717   UROBILINOGEN 1.0 09/18/2021 1509   UROBILINOGEN 0.2 09/30/2012 1959   NITRITE NEGATIVE 02/24/2023 1717   LEUKOCYTESUR NEGATIVE 02/24/2023 1717    Radiological Exams on Admission: CT ABDOMEN PELVIS WO CONTRAST Result Date: 12/27/2023 CLINICAL DATA:  Abdominal pain and vomiting for 2 weeks. Renal failure on peritoneal dialysis. EXAM: CT ABDOMEN AND PELVIS WITHOUT CONTRAST TECHNIQUE: Multidetector CT imaging of the abdomen and pelvis was performed following the standard protocol without IV contrast. RADIATION DOSE REDUCTION: This exam was performed according to the departmental dose-optimization program which includes automated exposure control, adjustment of the mA and/or kV according to patient size and/or use of iterative reconstruction technique. COMPARISON:  02/04/2022 FINDINGS: Lower chest: No acute findings. Hepatobiliary: No mass visualized on this unenhanced exam. Gallbladder is unremarkable. No evidence of biliary ductal dilatation. Pancreas: No mass or  inflammatory process visualized on this unenhanced exam. Spleen:  Within normal limits in size. Adrenals/Urinary tract: Extensive bilateral renal vascular calcification noted. No definite evidence of urolithiasis, or hydronephrosis. Small fluid attenuation cyst again seen in posterior upper pole of  right kidney. Bladder is empty. Peritoneal dialysis catheter seen in the pelvis. Stomach/Bowel: No evidence of obstruction, inflammatory process, or abnormal fluid collections. Normal appendix visualized. Vascular/Lymphatic: No pathologically enlarged lymph nodes identified. No evidence of abdominal aortic aneurysm. Reproductive:  No mass or other significant abnormality. Other:  None. Musculoskeletal:  No suspicious bone lesions identified. IMPRESSION: No acute findings or other significant abnormality. Peritoneal dialysis catheter noted. Electronically Signed   By: Norleen DELENA Kil M.D.   On: 12/27/2023 16:27   EKG: Independently reviewed.  Sinus tachycardia rate 103, QTc 417.  No significant change from prior.  Assessment/Plan Principal Problem:   Intractable vomiting Active Problems:   History of stroke   Coronary artery disease involving native heart without angina pectoris   Cardiomyopathy (HCC)   Essential hypertension   Hx of BKA, left (HCC)   Diabetic gastroparesis (HCC)   Dependence on peritoneal dialysis   Assessment and Plan:  Intractable vomiting, hypotension-vomiting of 2 weeks duration.  2 watery stools over the past 2 days but patient is taking MiraLAX .  No abdominal pain.  History of gastroparesis- likely etiology.  Blood pressure systolic ranging from 80s to 108. - Zofran  as needed - Total of 750 bolus given. -Bowel rest with clear liquid diet - Caution with hydration given ESRD status.  ESRD on peritoneal dialysis-reports compliance with home peritoneal dialysis 5 days a week.  Takes a break on Wednesdays and Saturdays, last HD was yesterday. - EDP talked to Dr. Adonica, recommended admission to [facility] as peritoneal dialysis is not available here at [facility] - Body fluid culture Gram stain ordered per nephrology  Diabetes, type I-  - Resume Lantus  10 units daily - SSI- S - HgbA1c  Coronary artery disease, cardiomyopathy-stable.  Last echo 12/2022 EF of  55 to 60% grade 1 DD.  History of hypertension not on antihypertensives  Thrombophilia, DVT - Resume Eliquis    DVT prophylaxis:  Eliquis  Code Status: FULL Family Communication: Daughter- Chelsea at bedside Disposition Plan: ~ 2 days Consults called: Nephrology Admission status:  Obs tele     Author: Tully FORBES Carwin, MD 12/27/2023 8:47 PM  For on call review [email or web address].

## 2023-12-28 DIAGNOSIS — I2510 Atherosclerotic heart disease of native coronary artery without angina pectoris: Secondary | ICD-10-CM | POA: Diagnosis present

## 2023-12-28 DIAGNOSIS — Z992 Dependence on renal dialysis: Secondary | ICD-10-CM | POA: Diagnosis not present

## 2023-12-28 DIAGNOSIS — I252 Old myocardial infarction: Secondary | ICD-10-CM | POA: Diagnosis not present

## 2023-12-28 DIAGNOSIS — K2970 Gastritis, unspecified, without bleeding: Secondary | ICD-10-CM | POA: Diagnosis present

## 2023-12-28 DIAGNOSIS — K3184 Gastroparesis: Secondary | ICD-10-CM | POA: Diagnosis present

## 2023-12-28 DIAGNOSIS — E871 Hypo-osmolality and hyponatremia: Secondary | ICD-10-CM | POA: Diagnosis present

## 2023-12-28 DIAGNOSIS — E1143 Type 2 diabetes mellitus with diabetic autonomic (poly)neuropathy: Secondary | ICD-10-CM | POA: Diagnosis not present

## 2023-12-28 DIAGNOSIS — Z794 Long term (current) use of insulin: Secondary | ICD-10-CM | POA: Diagnosis not present

## 2023-12-28 DIAGNOSIS — I959 Hypotension, unspecified: Secondary | ICD-10-CM | POA: Diagnosis present

## 2023-12-28 DIAGNOSIS — D631 Anemia in chronic kidney disease: Secondary | ICD-10-CM | POA: Diagnosis present

## 2023-12-28 DIAGNOSIS — N186 End stage renal disease: Secondary | ICD-10-CM | POA: Diagnosis not present

## 2023-12-28 DIAGNOSIS — R1110 Vomiting, unspecified: Secondary | ICD-10-CM | POA: Diagnosis present

## 2023-12-28 DIAGNOSIS — E10649 Type 1 diabetes mellitus with hypoglycemia without coma: Secondary | ICD-10-CM | POA: Diagnosis present

## 2023-12-28 DIAGNOSIS — E1065 Type 1 diabetes mellitus with hyperglycemia: Secondary | ICD-10-CM | POA: Diagnosis present

## 2023-12-28 DIAGNOSIS — E1051 Type 1 diabetes mellitus with diabetic peripheral angiopathy without gangrene: Secondary | ICD-10-CM | POA: Diagnosis present

## 2023-12-28 DIAGNOSIS — E1022 Type 1 diabetes mellitus with diabetic chronic kidney disease: Secondary | ICD-10-CM | POA: Diagnosis present

## 2023-12-28 DIAGNOSIS — I120 Hypertensive chronic kidney disease with stage 5 chronic kidney disease or end stage renal disease: Secondary | ICD-10-CM | POA: Diagnosis present

## 2023-12-28 DIAGNOSIS — E860 Dehydration: Secondary | ICD-10-CM | POA: Diagnosis present

## 2023-12-28 DIAGNOSIS — Z8673 Personal history of transient ischemic attack (TIA), and cerebral infarction without residual deficits: Secondary | ICD-10-CM | POA: Diagnosis not present

## 2023-12-28 DIAGNOSIS — D6859 Other primary thrombophilia: Secondary | ICD-10-CM | POA: Diagnosis present

## 2023-12-28 DIAGNOSIS — I10 Essential (primary) hypertension: Secondary | ICD-10-CM | POA: Diagnosis not present

## 2023-12-28 DIAGNOSIS — E785 Hyperlipidemia, unspecified: Secondary | ICD-10-CM | POA: Diagnosis present

## 2023-12-28 DIAGNOSIS — I429 Cardiomyopathy, unspecified: Secondary | ICD-10-CM | POA: Diagnosis not present

## 2023-12-28 DIAGNOSIS — K529 Noninfective gastroenteritis and colitis, unspecified: Secondary | ICD-10-CM | POA: Diagnosis present

## 2023-12-28 DIAGNOSIS — Z89512 Acquired absence of left leg below knee: Secondary | ICD-10-CM | POA: Diagnosis not present

## 2023-12-28 DIAGNOSIS — Z7901 Long term (current) use of anticoagulants: Secondary | ICD-10-CM | POA: Diagnosis not present

## 2023-12-28 DIAGNOSIS — E1043 Type 1 diabetes mellitus with diabetic autonomic (poly)neuropathy: Secondary | ICD-10-CM | POA: Diagnosis present

## 2023-12-28 DIAGNOSIS — E8770 Fluid overload, unspecified: Secondary | ICD-10-CM | POA: Diagnosis present

## 2023-12-28 DIAGNOSIS — L89302 Pressure ulcer of unspecified buttock, stage 2: Secondary | ICD-10-CM | POA: Diagnosis present

## 2023-12-28 LAB — GLUCOSE, CAPILLARY
Glucose-Capillary: 58 mg/dL — ABNORMAL LOW (ref 70–99)
Glucose-Capillary: 178 mg/dL — ABNORMAL HIGH (ref 70–99)
Glucose-Capillary: 148 mg/dL — ABNORMAL HIGH (ref 70–99)
Glucose-Capillary: 41 mg/dL — CL (ref 70–99)
Glucose-Capillary: 45 mg/dL — ABNORMAL LOW (ref 70–99)
Glucose-Capillary: 89 mg/dL (ref 70–99)
Glucose-Capillary: 345 mg/dL — ABNORMAL HIGH (ref 70–99)
Glucose-Capillary: 242 mg/dL — ABNORMAL HIGH (ref 70–99)
Glucose-Capillary: 373 mg/dL — ABNORMAL HIGH (ref 70–99)

## 2023-12-28 LAB — CBC
HCT: 34.3 % — ABNORMAL LOW (ref 36.0–46.0)
Hemoglobin: 11 g/dL — ABNORMAL LOW (ref 12.0–15.0)
MCH: 31.3 pg (ref 26.0–34.0)
MCHC: 32.1 g/dL (ref 30.0–36.0)
MCV: 97.4 fL (ref 80.0–100.0)
Platelets: 319 K/uL (ref 150–400)
RBC: 3.52 MIL/uL — ABNORMAL LOW (ref 3.87–5.11)
RDW: 14.1 % (ref 11.5–15.5)
WBC: 10.5 K/uL (ref 4.0–10.5)
nRBC: 0 % (ref 0.0–0.2)

## 2023-12-28 LAB — BASIC METABOLIC PANEL WITH GFR
Anion gap: 11 (ref 5–15)
BUN: 47 mg/dL — ABNORMAL HIGH (ref 6–20)
CO2: 25 mmol/L (ref 22–32)
Calcium: 7.6 mg/dL — ABNORMAL LOW (ref 8.9–10.3)
Chloride: 97 mmol/L — ABNORMAL LOW (ref 98–111)
Creatinine, Ser: 16.16 mg/dL — ABNORMAL HIGH (ref 0.44–1.00)
GFR, Estimated: 2 mL/min — ABNORMAL LOW (ref >60)
Glucose, Bld: 99 mg/dL (ref 70–99)
Potassium: 3.5 mmol/L (ref 3.5–5.1)
Sodium: 133 mmol/L — ABNORMAL LOW (ref 135–145)

## 2023-12-28 LAB — HIV ANTIBODY (ROUTINE TESTING W REFLEX): HIV Screen 4th Generation wRfx: NONREACTIVE

## 2023-12-28 MED ORDER — DEXTROSE-SODIUM CHLORIDE 2.5-0.45 % IV SOLN: INTRAVENOUS | Status: DC

## 2023-12-28 MED ORDER — INSULIN GLARGINE 100 UNIT/ML ~~LOC~~ SOLN
5.0000 [IU] | Freq: Every day | SUBCUTANEOUS | Status: DC
  Filled 2023-12-28: qty 0.05

## 2023-12-28 MED ORDER — INSULIN ASPART 100 UNIT/ML IJ SOLN
0.0000 [IU] | INTRAMUSCULAR | Status: DC
  Administered 2023-12-28: 3 [IU] via SUBCUTANEOUS
  Administered 2023-12-28: 5 [IU] via SUBCUTANEOUS
  Administered 2023-12-29: 3 [IU] via SUBCUTANEOUS
  Administered 2023-12-29: 1 [IU] via SUBCUTANEOUS
  Filled 2023-12-28: qty 3
  Filled 2023-12-28: qty 5
  Filled 2023-12-28: qty 3

## 2023-12-28 MED ORDER — SODIUM CHLORIDE 0.9 % IV SOLN: INTRAVENOUS | Status: DC

## 2023-12-28 MED ORDER — GENTAMICIN SULFATE 0.1 % EX CREA
1.0000 | TOPICAL_CREAM | Freq: Every day | CUTANEOUS | Status: DC
  Administered 2023-12-28 – 2023-12-29 (×3): 1 via TOPICAL
  Filled 2023-12-28: qty 15

## 2023-12-28 MED ORDER — INSULIN ASPART 100 UNIT/ML IJ SOLN: 0.0000 [IU] | INTRAMUSCULAR | Status: DC

## 2023-12-28 MED ORDER — DELFLEX-LC/1.5% DEXTROSE 344 MOSM/L IP SOLN: INTRAPERITONEAL | Status: DC

## 2023-12-28 MED ORDER — INSULIN ASPART 100 UNIT/ML IJ SOLN: 0.0000 [IU] | Freq: Three times a day (TID) | INTRAMUSCULAR | Status: DC

## 2023-12-28 MED ORDER — TRAMADOL HCL 50 MG PO TABS
50.0000 mg | ORAL_TABLET | Freq: Two times a day (BID) | ORAL | Status: DC | PRN
  Administered 2023-12-28 – 2023-12-29 (×2): 50 mg via ORAL
  Filled 2023-12-28 (×2): qty 1

## 2023-12-28 MED ORDER — SODIUM CHLORIDE 0.9 % IV SOLN: INTRAVENOUS | Status: AC

## 2023-12-28 NOTE — Progress Notes (Signed)
 Pt had a BS of 58. Pt stated she had not eaten all day. Food and orange was given. Pt BS is currently 178. Charge nurse was notified.

## 2023-12-28 NOTE — Inpatient Diabetes Management (Addendum)
 Inpatient Diabetes Program Recommendations  AACE/ADA: New Consensus Statement on Inpatient Glycemic Control   Target Ranges:  Prepandial:   less than 140 mg/dL      Peak postprandial:   less than 180 mg/dL (1-2 hours)      Critically ill patients:  140 - 180 mg/dL   Lab Results  Component Value Date   GLUCAP 89 12/28/2023   HGBA1C 8.5 (A) 08/01/2023    Latest Reference Range & Units 12/28/23 06:31 12/28/23 12:33 12/28/23 12:44 12/28/23 13:08  Glucose-Capillary 70 - 99 mg/dL 851 (H) 41 (LL) 45 (L) 89   Review of Glycemic Control  Diabetes history: DM1  Outpatient Diabetes medications:  Dexcom CGM  Novolog  1-7 units TID Lantus  10 units daily   Current orders for Inpatient glycemic control:  Lantus  10 units daily  Novolog  0-6 units q6hrs   Inpatient Diabetes Program Recommendations:   Noted:  - Patient has Type 1 diabetes - Patient is on Clear Liquid Diet  - Hypoglycemia this morning and this afternoon.   Please consider discontinuing basal insulin  and START custom correction starting with CBG of 200mg /dl Novolog  0-6 units Q4HRS.    Spoke with patient and daughter at bedside. Patient tearful and agitated stating she is hungry. Confirmed with daughter patient was taking Dexcom CGM, Novolog  1-7 units TID, Lantus  10 units daily prior to admission for diabetes control. Daughter, Mitzie states patient is forgetful and she manages patient's medications and assist with insulin  administration at home. Patient is wearing Dexcom and has reader at bedside to alert when CBG is low. Patient and daughter concerned with hypoglycemia and current insulin  regimen. Discussed with Dr Cherlyn.   Thanks,  Lavanda Search, RN, MSN, [facility]  Inpatient Diabetes Coordinator  Pager [phone] (8a-5p)

## 2023-12-28 NOTE — Consult Note (Signed)
 Renal Service Consult Note [facility] Lisa JONETTA Fret, MD  Patient: [name] Date: 12/28/2023 Requesting Physician: Dr. Cherlyn  Reason for Consult: ESRD pt on PD w/ refractory N/V HPI: The patient is a 53 y.o. year-old w/ PMH as below who presented last night 12/09 c/o refractory N/V and had low BP's in the doctor's office. Pt is on PD done at home and has not missed any sessions. In ED BP 108/59, HR 80, RR 15-22, temp 97 deg, SpO2 97% on RA. Lab showed creat 15, K+ 3.9, alb 3.7, Hb 13.9, Hb 14.9.  CT abd showed no acute findings. Pt rec'd [address] [place]  bolus, IV PPI and antiemetics. Pt admitted. We are asked to see for dialysis.    Pt seen in room. Pt states her daughter does all the PD, she doesn't know details. She did see the effluent PD fluid yesterday am when they were taking her off and the fluid was clear. She denies any abd pain at this time. She had a RUE fistula placed some time ago, but it was never used and she changed to PD which was just recently started about 1 month ago.    ROS - denies CP, no joint pain, no HA, no blurry vision, no rash   Past Medical History  Past Medical History:  Diagnosis Date   Acquired absence of left leg below knee (HCC)    Anxiety    was on xanax  for years but discontinued prior to prior PCP retiring as she was on opioids and was told she would not find someone to write for both   Arterial thromboembolism (HCC)    Prior embolectomy, previously on Coumadin    Cataract    Phreesia 11/19/2019   Chronic kidney disease    Phreesia 11/19/2019   Clotting disorder    Phreesia 11/19/2019   Coronary atherosclerosis of native coronary artery    a. s/p DES to LAD in 2014   Depression    Depression    Phreesia 11/19/2019   Diabetes mellitus without complication (HCC)    Phreesia 11/19/2019   Diabetic ketoacidosis with coma associated with type 1 diabetes mellitus (HCC)    DKA, type 1 (HCC) 11/17/2016   Facial cellulitis     12/2010   Glomerulonephritis    Headache(784.0)    Hemiplegia, unspecified affecting right dominant side (HCC)    History of stroke    Hyperlipidemia    Insulin  dependent diabetes mellitus    History of diabetic ketoacidosis   Ischemic cardiomyopathy    a. EF 30-35% in 2018 b. EF at 45-50% by repeat imaging in 02/2019   Left carotid artery occlusion    Major depressive disorder, single episode, unspecified    N&V (nausea and vomiting) 06/03/2021   Pancreatitis, acute 11/07/2016   Peripheral arterial disease    Shingles    11/2010   ST elevation myocardial infarction (STEMI) of anterior wall [facility])    Late presentation September 2014   Stroke [facility])    2018   Upper abdominal pain 06/03/2021   Past Surgical History  Past Surgical History:  Procedure Laterality Date   AMPUTATION  03/03/2011   Procedure: AMPUTATION DIGIT;  Surgeon: Jerona LULLA Sage, MD;  Location: [facility] OR;  Service: Orthopedics;  Laterality: Left;  Left Foot Amputation 4th and 5th toes at MTP joint   AMPUTATION  04/22/2011   Procedure: AMPUTATION BELOW KNEE;  Surgeon: Jerona LULLA Sage, MD;  Location: [facility] OR;  Service: Orthopedics;  Laterality: Left;  Left Below Knee Amputation   BIOPSY  07/17/2021   Procedure: BIOPSY;  Surgeon: Cindie Carlin POUR, DO;  Location: [facility] ENDO SUITE;  Service: Endoscopy;;   CATARACT EXTRACTION W/PHACO Left 01/09/2020   Procedure: CATARACT EXTRACTION PHACO AND INTRAOCULAR LENS PLACEMENT (IOC);  Surgeon: Harrie Agent, MD;  Location: [facility] ORS;  Service: Ophthalmology;  Laterality: Left;  CDE: 9.73   COLONOSCOPY WITH PROPOFOL  N/A 12/14/2021   Procedure: COLONOSCOPY WITH PROPOFOL ;  Surgeon: Cindie Carlin POUR, DO;  Location: [facility] ENDO SUITE;  Service: Endoscopy;  Laterality: N/A;  9:45 am   DILATION AND CURETTAGE OF UTERUS     EMBOLECTOMY  01/29/2011   Procedure: EMBOLECTOMY;  Surgeon: Agent JONETTA Collum, MD;  Location: [facility] OR;  Service: Vascular;  Laterality: Left;  Left Popliteal and Tibial Embolectomy with patch  angioplasty   ESOPHAGOGASTRODUODENOSCOPY (EGD) WITH PROPOFOL  N/A 07/17/2021   Procedure: ESOPHAGOGASTRODUODENOSCOPY (EGD) WITH PROPOFOL ;  Surgeon: Cindie Carlin POUR, DO;  Location: [facility] END[facility] SUITE;  Service: Endoscopy;  Laterality: N/A;  1:00pm   INCISION AND DRAINAGE ABSCESS N/A 04/06/2016   Procedure: INCISION AND DRAINAGE ABSCESS;  Surgeon: Norleen Edsel GAILS, MD;  Location: [facility] ORS;  Service: Gynecology;  Laterality: N/A;   LEFT HEART CATHETERIZATION WITH CORONARY ANGIOGRAM N/A 10/01/2012   Procedure: LEFT HEART CATHETERIZATION WITH CORONARY ANGIOGRAM;  Surgeon: Peter M Jordan, MD;  Location[facility] CATH LAB;  Service: Cardiovascular;  Laterality: N/A;   LOWER EXTREMITY ANGIOGRAM N/A 01/27/2011   Procedure: LOWER EXTREMITY ANGIOGRAM;  Surgeon: Krystal JULIANNA Doing, MD;  Location: [facility] CATH LAB;  Service: Cardiovascular;  Laterality: N/A;   LOWER EXTREMITY ANGIOGRAM N/A 01/28/2011   Procedure: LOWER EXTREMITY ANGIOGRAM;  Surgeon: Redell LITTIE Door, MD;  Location: [facility] CATH LAB;  Service: Cardiovascular;  Laterality: N/A;   LOWER EXTREMITY ANGIOGRAM Left 01/29/2011   Procedure: LOWER EXTREMITY ANGIOGRAM;  Surgeon: Agent JONETTA Collum, MD;  Location: [facility] CATH LAB;  Service: Cardiovascular;  Laterality: Left;   MULTIPLE TOOTH EXTRACTIONS     POLYPECTOMY  12/14/2021   Procedure: POLYPECTOMY;  Surgeon: Cindie Carlin POUR, DO;  [facility];  Service: Endoscopy;;   TEE WITHOUT CARDIOVERSION  04/15/2011   Procedure: TRANSESOPHAGEAL ECHOCARDIOGRAM (TEE);  Surgeon: Lisa CHRISTELLA Rom, MD;  [facility];  Service: Cardiovascular;  Laterality: N/A;   WRIST SURGERY     Left   Family History  Family History  Problem Relation Age of Onset   COPD Mother    Hypertension Mother    Diabetes Mother    Stroke Mother    Coronary artery disease Mother    Diabetes Father    Hypertension Father    Coronary artery disease Father    Kidney disease Father        on dialysis, died from heart attack while being dialzed    Pancreatitis Maternal Aunt    Cancer Maternal Aunt        unknown primary   Anesthesia problems Neg Hx    Colon cancer Neg Hx    Social History  reports that she has been smoking cigarettes. She started smoking about 34 years ago. She has a 17.2 pack-year smoking history. She has never used smokeless tobacco. She reports current drug use. Frequency: 1.00 time per week. Drug: Marijuana. She reports that she does not drink alcohol. Allergies  Allergies  Allergen Reactions   Losartan  Swelling   Nsaids Other (See Comments)    Kidney disease   Home medications Prior to Admission medications   Medication Sig Start Date  End Date Taking? Authorizing Provider  acetaminophen  (TYLENOL ) 500 MG tablet Take 500 mg by mouth every 6 (six) hours as needed for moderate pain.   Yes [provider]  albuterol  (VENTOLIN  HFA) 108 (90 Base) MCG/ACT inhaler Inhale 2 puffs into the lungs every 6 (six) hours as needed for wheezing or shortness of breath. 02/08/23  Yes Tobie Suzzane POUR, MD  atorvastatin  (LIPITOR ) 80 MG tablet TAKE ONE TABLET (80MG  TOTAL) BY MOUTH DAILY 01/20/23  Yes Debera Jayson MATSU, MD  calcium  carbonate (TUMS - DOSED IN MG ELEMENTAL CALCIUM ) 500 MG chewable tablet Chew 1 tablet by mouth 3 (three) times daily with meals.   Yes [provider]  calcium  carbonate (TUMS EX) 750 MG chewable tablet Chew 1 tablet by mouth daily as needed for heartburn.   Yes [provider]  ELIQUIS  2.5 MG TABS tablet TAKE ONE TABLET (2.5MG  TOTAL) BY MOUTH TWO TIMES DAILY 07/28/23  Yes Debera Jayson MATSU, MD  insulin  aspart (NOVOLOG ) 100 UNIT/ML FlexPen Inject 1-7 Units into the skin 3 (three) times daily with meals. 08/23/22  Yes Therisa Benton PARAS, NP  insulin  glargine (LANTUS  SOLOSTAR) 100 UNIT/ML Solostar Pen Inject 20 Units into the skin at bedtime. Patient taking differently: Inject 10 Units into the skin at bedtime. 01/13/23  Yes Therisa Benton PARAS, NP  levocetirizine (XYZAL ) 5 MG tablet Take  1 tablet (5 mg total) by mouth every evening. Patient taking differently: Take 5 mg by mouth at bedtime as needed for allergies. 05/09/23  Yes Tobie Suzzane POUR, MD  mirtazapine  (REMERON ) 15 MG tablet Take 15 mg by mouth at bedtime.   Yes [provider]  nitroGLYCERIN  (NITROSTAT ) 0.4 MG SL tablet PLACE ONE TABLET (0.4MG  TOTAL) UNDER THETONGUE EVERY FIVE MINUTES AS NEEDED FOR CHEST PAIN Patient taking differently: Place 0.4 mg under the tongue every 5 (five) minutes as needed for chest pain. 11/11/22  Yes Debera Jayson MATSU, MD  ondansetron  (ZOFRAN ) 4 MG tablet Take 8 mg by mouth every 8 (eight) hours as needed for nausea or vomiting.   Yes [provider]  oxyCODONE  (OXY IR/ROXICODONE ) 5 MG immediate release tablet Take 5 mg by mouth every 6 (six) hours as needed for moderate pain (pain score 4-6). 08/24/23  Yes [provider]  simethicone (GAS-X) 80 MG chewable tablet Chew 80 mg by mouth every 6 (six) hours as needed for flatulence.   Yes [provider]  Continuous Glucose Sensor (DEXCOM G7 SENSOR) MISC Inject 1 Application into the skin as directed. Change sensor every 10 days as directed. 05/09/23   Therisa Benton PARAS, NP  glucose blood (ACCU-CHEK GUIDE TEST) test strip Use as instructed to monitor glucose 4 times daily.  Use as back up to CGM. 05/10/23   Reardon, Benton PARAS, NP  Insulin  Pen Needle (UNIFINE PENTIPS) 31G X 8 MM MISC USE TO INJECT INSULIN  SUBCUTANEOUSLY. USE FOR SLIDING SCALE AND FOR DAILY INSULIN  DOSES. 09/21/23   Therisa Benton PARAS, NP  Misc. Devices MISC Wheelchair cushion - small. 12/31/21   Tobie Suzzane POUR, MD  Misc. Devices MISC Manual wheelchair - customized size. 05/09/23   Tobie Suzzane POUR, MD  Misc. Devices MISC Bedside commode - 1. 05/09/23   Tobie Suzzane POUR, MD  Spacer/Aero-Holding Chambers (PRO COMFORT SPACER ADULT) MISC Use it as directed for Albuterol  inhaler. Okay to dispense generic alternative preferred by insurance. 02/18/23   Tobie Suzzane POUR, MD     Vitals:   12/27/23 2030 12/27/23 2100 12/28/23 0456 12/28/23 1000  BP: (!) 94/53 96/61 (!) 97/45 (!) 91/49  Pulse: 89 81 73 68  Resp: (!) 26 (!) 22 17 16   Temp:  (!) 97.4 F (36.3 C) 98 F (36.7 C) 97.7 F (36.5 C)  TempSrc:  Oral Oral Oral  SpO2: 95% 94% 97% 97%  Weight:  60.3 kg    Height:       Exam Gen alert, no distress, elderly, pleasant  On RA, lying flat Sclera anicteric, throat clear and minimally moist [name] or bruits Chest clear bilat to bases RRR no MRG Abd soft ntnd no mass or ascites +bs L BKA Ext no LE or UE edema, no other edema Neuro is alert, Ox 3 , nf    PD cath R mid-abdomen, intact  Home bp meds: none   OP PD: Dr Tobie or Rachele, CCPD at [facility] to reach anybody at [facility] PD [place] Does PD 5 nights per week, takes a break on Wed and Saturday   Assessment/ Plan: Intractable N/V: getting antiemetics, per pmd ESRD: on PD started 1 month ago. Will cont PD nightly while here. Get records tomorrow when the office is open.  BP: bp's soft here, not on bp lowering meds at home. Looks dry, will add IVFs 75 cc/hr x 18 hrs.  Volume: looks dry as above, plan IVF's.  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 Follow.  T1DM: per coralee Myer Fret  MD [facility] 12/28/2023, 11:32 AM  Recent Labs  Lab 12/27/23 1537 12/28/23 0252  HGB 14.9 11.0*  ALBUMIN  3.7  --   CALCIUM  9.9 7.6*  CREATININE 15.20* 16.16*  K 3.9 3.5   Inpatient medications:  apixaban   2.5 mg Oral BID   insulin  aspart  0-9 Units Subcutaneous Q6H   insulin  glargine  10 Units Subcutaneous QHS   mirtazapine   15 mg Oral QHS   pantoprazole  (PROTONIX ) IV  40 mg Intravenous Q24H    acetaminophen  **OR** acetaminophen , ondansetron  **OR** ondansetron  (ZOFRAN ) IV, polyethylene glycol

## 2023-12-28 NOTE — Progress Notes (Signed)
 [facility]                                                                               [name], is a 53 y.o. female, DOB - 08/06/1970, FMW:[id number] Admit date - 12/27/2023    Outpatient Primary MD for the patient is [facility]  LOS - 0  days    Brief summary   [name] is a 53 y.o. female with medical history significant for ESRD on peritoneal dialysis, left below-knee amputation, cardiomyopathy, diabetes mellitus, stroke  presented to the ED with complaints of vomiting at least 3 times daily over the past 2 weeks.  She has not been able to keep anything down.  Reports to 2 episodes of watery stools over the past 2 days, but daughter reports she gave patient extra MiraLAX  as she had not had a bowel movement.  Today, she denies any nausea, vomiting or diarrhea. She reports being hungry.    Assessment & Plan    Assessment and Plan:   Persistent vomiting and an episode of diarrhea Differential include gastritis vs gastroenteritis vs gastroparesis.  Monitor. She was started on clears, was able to tolerate without any issues.  Advance diet to solids this afternoon and monitor.  Continue with PPI for now.  Pain control    ESRD on PD 5 nights a week.  Nephrology on board, appreciate recommendations.  She appears dehydrated, IV fluids ordered.    Type 1 Diabetes mellitus: uncontrolled with hypoglycemia.  CBG (last 3)  Recent Labs    12/28/23 1233 12/28/23 1244 12/28/23 1308  GLUCAP 41* 45* 89   Stopped the basal insulin  in view of her hypoglycemia episodes.  Get A1c.  Started her on SSI very sensitive scale.      H/o Stroke Continue with eliquis  2.5 mg BID.    RN Pressure Injury Documentation: Wound 12/27/23 2200 Pressure Injury Buttocks Stage 2 -  Partial thickness loss of dermis presenting as a shallow open injury with a red, pink wound bed without slough. (Active)     Estimated body mass index is 25.96 kg/m as  calculated from the following:   Height as of this encounter: 5' (1.524 m).   Weight as of this encounter: 60.3 kg.  Code Status: full code.  DVT Prophylaxis:  apixaban  (ELIQUIS ) tablet 2.5 mg Start: 12/27/23 2230 apixaban  (ELIQUIS ) tablet 2.5 mg   Level of Care: Level of care: Telemetry Family Communication:none at bedside.   Disposition Plan:     Remains inpatient appropriate:  pending clinical improvement.   Procedures:  CT abd and pelvis   Consultants:   Nephrology.   Antimicrobials:   Anti-infectives (From admission, onward)    None        Medications  Scheduled Meds:  apixaban   2.5 mg Oral BID   gentamicin  cream  1 Application Topical Daily   insulin  aspart  0-6 Units Subcutaneous Q4H   [START ON 12/29/2023] insulin  glargine  5 Units Subcutaneous QHS   mirtazapine   15 mg Oral QHS   pantoprazole  (PROTONIX ) IV  40 mg Intravenous Q24H   Continuous Infusions:  sodium chloride  75 mL/hr at 12/28/23 1302   dextrose  2.5 %  and 0.45 % NaCl     dialysis solution 1.5% low-MG/low-CA     PRN Meds:.acetaminophen  **OR** acetaminophen , ondansetron  **OR** ondansetron  (ZOFRAN ) IV, polyethylene glycol, traMADol     Subjective:   Shayra Anton was seen and examined today.  Not feeling good, denies any pain in the abdomen.   Objective:   Vitals:   12/27/23 2030 12/27/23 2100 12/28/23 0456 12/28/23 1000  BP: (!) 94/53 96/61 (!) 97/45 (!) 91/49  Pulse: 89 81 73 68  Resp: (!) 26 (!) 22 17 16   Temp:  (!) 97.4 F (36.3 C) 98 F (36.7 C) 97.7 F (36.5 C)  TempSrc:  Oral Oral Oral  SpO2: 95% 94% 97% 97%  Weight:  60.3 kg    Height:        Intake/Output Summary (Last 24 hours) at 12/28/2023 1503 Last data filed at 12/27/2023 1807 Gross per 24 hour  Intake 500 ml  Output --  Net 500 ml   Filed Weights   12/27/23 1501 12/27/23 2100  Weight: 51 kg 60.3 kg     Exam General: Alert and oriented x 3, NAD Cardiovascular: S1 S2 auscultated, no murmurs,  RRR Respiratory: Clear to auscultation bilaterally, no wheezing, rales or rhonchi Gastrointestinal: Soft, nontender, nondistended, + bowel sounds Ext: no pedal edema bilaterally Neuro: AAOx3,    Data Reviewed:  I have personally reviewed following labs and imaging studies   CBC Lab Results  Component Value Date   WBC 10.5 12/28/2023   RBC 3.52 (L) 12/28/2023   HGB 11.0 (L) 12/28/2023   HCT 34.3 (L) 12/28/2023   MCV 97.4 12/28/2023   MCH 31.3 12/28/2023   PLT 319 12/28/2023   MCHC 32.1 12/28/2023   RDW 14.1 12/28/2023   LYMPHSABS 2.2 02/11/2022   MONOABS 0.7 01/07/2020   EOSABS 0.3 02/11/2022   BASOSABS 0.0 02/11/2022     Last metabolic panel Lab Results  Component Value Date   NA 133 (L) 12/28/2023   K 3.5 12/28/2023   CL 97 (L) 12/28/2023   CO2 25 12/28/2023   BUN 47 (H) 12/28/2023   CREATININE 16.16 (H) 12/28/2023   GLUCOSE 99 12/28/2023   GFRNONAA 2 (L) 12/28/2023   GFRAA 28 (L) 09/28/2019   CALCIUM  7.6 (L) 12/28/2023   PHOS 2.7 11/24/2016   PROT 7.8 12/27/2023   ALBUMIN  3.7 12/27/2023   LABGLOB 2.4 02/11/2022   AGRATIO 1.7 02/11/2022   BILITOT 0.4 12/27/2023   ALKPHOS 124 12/27/2023   AST 25 12/27/2023   ALT 21 12/27/2023   ANIONGAP 11 12/28/2023    CBG (last 3)  Recent Labs    12/28/23 1233 12/28/23 1244 12/28/23 1308  GLUCAP 41* 45* 89      Coagulation Profile: No results for input(s): INR, PROTIME in the last 168 hours.   Radiology Studies: CT ABDOMEN PELVIS WO CONTRAST Result Date: 12/27/2023 CLINICAL DATA:  Abdominal pain and vomiting for 2 weeks. Renal failure on peritoneal dialysis. EXAM: CT ABDOMEN AND PELVIS WITHOUT CONTRAST TECHNIQUE: Multidetector CT imaging of the abdomen and pelvis was performed following the standard protocol without IV contrast. RADIATION DOSE REDUCTION: This exam was performed according to the departmental dose-optimization program which includes automated exposure control, adjustment of the mA and/or kV  according to patient size and/or use of iterative reconstruction technique. COMPARISON:  02/04/2022 FINDINGS: Lower chest: No acute findings. Hepatobiliary: No mass visualized on this unenhanced exam. Gallbladder is unremarkable. No evidence of biliary ductal dilatation. Pancreas: No mass or inflammatory process visualized on this unenhanced exam.  Spleen:  Within normal limits in size. Adrenals/Urinary tract: Extensive bilateral renal vascular calcification noted. No definite evidence of urolithiasis, or hydronephrosis. Small fluid attenuation cyst again seen in posterior upper pole of right kidney. Bladder is empty. Peritoneal dialysis catheter seen in the pelvis. Stomach/Bowel: No evidence of obstruction, inflammatory process, or abnormal fluid collections. Normal appendix visualized. Vascular/Lymphatic: No pathologically enlarged lymph nodes identified. No evidence of abdominal aortic aneurysm. Reproductive:  No mass or other significant abnormality. Other:  None. Musculoskeletal:  No suspicious bone lesions identified. IMPRESSION: No acute findings or other significant abnormality. Peritoneal dialysis catheter noted. Electronically Signed   By: Norleen DELENA Kil M.D.   On: 12/27/2023 16:27       Elgie Butter M.D. [facility] 12/28/2023, 3:03 PM  Available via Epic secure chat 7am-7pm After 7 pm, please refer to night coverage provider listed on amion.

## 2023-12-28 NOTE — Plan of Care (Signed)

## 2023-12-28 NOTE — Procedures (Signed)
 I have reviewed the PD prescription and made appropriate changes as needed.  Myer Fret MD  [facility] 12/28/2023, 5:07 PM]

## 2023-12-29 DIAGNOSIS — I10 Essential (primary) hypertension: Secondary | ICD-10-CM | POA: Diagnosis not present

## 2023-12-29 DIAGNOSIS — Z992 Dependence on renal dialysis: Secondary | ICD-10-CM | POA: Diagnosis not present

## 2023-12-29 DIAGNOSIS — R1110 Vomiting, unspecified: Secondary | ICD-10-CM | POA: Diagnosis not present

## 2023-12-29 DIAGNOSIS — K3184 Gastroparesis: Secondary | ICD-10-CM | POA: Diagnosis not present

## 2023-12-29 DIAGNOSIS — Z8673 Personal history of transient ischemic attack (TIA), and cerebral infarction without residual deficits: Secondary | ICD-10-CM | POA: Diagnosis not present

## 2023-12-29 DIAGNOSIS — E1143 Type 2 diabetes mellitus with diabetic autonomic (poly)neuropathy: Secondary | ICD-10-CM | POA: Diagnosis not present

## 2023-12-29 LAB — BASIC METABOLIC PANEL WITH GFR
Anion gap: 10 (ref 5–15)
BUN: 44 mg/dL — ABNORMAL HIGH (ref 6–20)
CO2: 24 mmol/L (ref 22–32)
Calcium: 6.6 mg/dL — ABNORMAL LOW (ref 8.9–10.3)
Chloride: 95 mmol/L — ABNORMAL LOW (ref 98–111)
Creatinine, Ser: 13.76 mg/dL — ABNORMAL HIGH (ref 0.44–1.00)
GFR, Estimated: 3 mL/min — ABNORMAL LOW (ref >60)
Glucose, Bld: 156 mg/dL — ABNORMAL HIGH (ref 70–99)
Potassium: 3.2 mmol/L — ABNORMAL LOW (ref 3.5–5.1)
Sodium: 129 mmol/L — ABNORMAL LOW (ref 135–145)

## 2023-12-29 LAB — CBC WITH DIFFERENTIAL/PLATELET
Abs Immature Granulocytes: 0.06 K/uL (ref 0.00–0.07)
Basophils Absolute: 0.1 K/uL (ref 0.0–0.1)
Basophils Relative: 1 %
Eosinophils Absolute: 0.4 K/uL (ref 0.0–0.5)
Eosinophils Relative: 4 %
HCT: 32.9 % — ABNORMAL LOW (ref 36.0–46.0)
Hemoglobin: 10.7 g/dL — ABNORMAL LOW (ref 12.0–15.0)
Immature Granulocytes: 1 %
Lymphocytes Relative: 35 %
Lymphs Abs: 3 K/uL (ref 0.7–4.0)
MCH: 31.6 pg (ref 26.0–34.0)
MCHC: 32.5 g/dL (ref 30.0–36.0)
MCV: 97.1 fL (ref 80.0–100.0)
Monocytes Absolute: 0.9 K/uL (ref 0.1–1.0)
Monocytes Relative: 10 %
Neutro Abs: 4.2 K/uL (ref 1.7–7.7)
Neutrophils Relative %: 49 %
Platelets: 256 K/uL (ref 150–400)
RBC: 3.39 MIL/uL — ABNORMAL LOW (ref 3.87–5.11)
RDW: 13.8 % (ref 11.5–15.5)
WBC: 8.6 K/uL (ref 4.0–10.5)
nRBC: 0 % (ref 0.0–0.2)

## 2023-12-29 LAB — GLUCOSE, CAPILLARY
Glucose-Capillary: 167 mg/dL — ABNORMAL HIGH (ref 70–99)
Glucose-Capillary: 221 mg/dL — ABNORMAL HIGH (ref 70–99)
Glucose-Capillary: 340 mg/dL — ABNORMAL HIGH (ref 70–99)
Glucose-Capillary: 399 mg/dL — ABNORMAL HIGH (ref 70–99)

## 2023-12-29 MED ORDER — INSULIN GLARGINE 100 UNIT/ML ~~LOC~~ SOLN
5.0000 [IU] | Freq: Every day | SUBCUTANEOUS | Status: DC
  Administered 2023-12-29: 5 [IU] via SUBCUTANEOUS
  Filled 2023-12-29: qty 0.05

## 2023-12-29 MED ADMIN — Insulin Aspart Inj Soln 100 Unit/ML: 5 [IU] | SUBCUTANEOUS | NDC 73070010011

## 2023-12-29 MED FILL — Insulin Aspart Inj Soln 100 Unit/ML: 0.0000 [IU] | INTRAMUSCULAR | Qty: 5 | Status: AC

## 2023-12-29 NOTE — Progress Notes (Signed)
 Peritoneal dialysis treatment continued, patient tolerated treatment well. Catheter secured to abdomen   12/29/23 0740  Peritoneal Catheter Mid lower abdomen  No placement date or time found.   Catheter Location: Mid lower abdomen  Site Assessment Clean, Dry, Intact  Drainage Description None  Catheter status Deaccessed  Dressing Gauze/Drain sponge  Dressing Status Clean, Dry, Intact  Dressing Intervention Assessed, no intervention needed  Completion  Treatment Status Complete  Initial Drain Volume 5  Average Dwell Time-Hour(s) 1  Average Dwell Time-Min(s) 30  Average Drain Time 13  Total Therapy Volume 7502  Total Therapy Time-Hour(s) 9  Total Therapy Time-Min(s) 12  Weight after Drain  (per floor)  Effluent Appearance Clear;Yellow  Cell Count on Daytime Exchange N/A  Fluid Balance - CCPD  Total UF (+ value on cycler, pt loss) 12 mL  Procedure Comments  Tolerated treatment well? Yes  Education / Care Plan  Dialysis Education Provided Yes  Hand-off documentation  Hand-off Given Given to shift RN/LPN  Report given to (Full Name) Jeanelle Barrack, RN  Hand-off Received Received from shift RN/LPN  Report received from (Full Name) Clotilda, RN

## 2023-12-29 NOTE — Progress Notes (Signed)
 [facility] Liaison Note                 This patient is enrolled in the Home Based Primary Care program of [facility].   Hospital Liaison Team will follow for disposition.   Please reach out if there are questions or concerns.   Greig Basket, BSN, RN [facility] Liaison  [phone]

## 2023-12-29 NOTE — Plan of Care (Signed)

## 2023-12-29 NOTE — Progress Notes (Addendum)
 Message left at [facility] home therapy dept to confirm pt is active at clinic. Will await a return call and assist as needed.  Randine Mungo Dialysis Navigator [phone]  Addendum at 10:54 am: Pt receives PD care through [facility] and seen by Dr Rachele. Will assist as needed.   Addendum at 3:27 pm: D/C order noted. Contacted [facility] home therapy dept to be advised of pt's d/c today and that pt should resume PD at d/c.

## 2023-12-29 NOTE — Progress Notes (Signed)
°  [facility] Progress Note   Subjective: Completed PD overnight. No issues reported.  Seen in room. Tired and has a headache. Denies cp, sob, n/v.    Objective Vitals:   12/29/23 0414 12/29/23 0652 12/29/23 0836 12/29/23 0840  BP:  107/61  (!) 96/46  Pulse:  64  77  Resp:  18  18  Temp:  97.7 F (36.5 C)  98.2 F (36.8 C)  TempSrc:  Oral  Oral  SpO2:    97%  Weight: 60.1 kg  44.6 kg   Height:        Additional Objective Labs: Basic Metabolic Panel: Recent Labs  Lab 12/27/23 1537 12/28/23 0252 12/29/23 0509  NA 139 133* 129*  K 3.9 3.5 3.2*  CL 91* 97* 95*  CO2 23 25 24   GLUCOSE 178* 99 156*  BUN 46* 47* 44*  CREATININE 15.20* 16.16* 13.76*  CALCIUM  9.9 7.6* 6.6*   CBC: Recent Labs  Lab 12/27/23 1537 12/28/23 0252 12/29/23 0509  WBC 13.9* 10.5 8.6  NEUTROABS  --   --  4.2  HGB 14.9 11.0* 10.7*  HCT 46.2* 34.3* 32.9*  MCV 97.3 97.4 97.1  PLT 502* 319 256   Blood Culture    Component Value Date/Time   SDES  08/03/2018 0356    URINE, CLEAN CATCH Performed at [facility], [name], [place], [place] [address]    [facility]  08/03/2018 0356    NONE Performed at [facility], [address] [place], [place] [address]    CULT >=100,000 COLONIES/mL ESCHERICHIA COLI (A) 08/03/2018 0356   REPTSTATUS 08/05/2018 FINAL 08/03/2018 0356     Physical Exam General: Alert, nad Heart: RRR Lungs: Clear Abdomen: Non-tender Extremities: no LE edema  Dialysis Access: PD cath in place   Medications:  dialysis solution 1.5% low-MG/low-CA      apixaban   2.5 mg Oral BID   gentamicin  cream  1 Application Topical Daily   insulin  aspart  0-6 Units Subcutaneous TID WC   insulin  glargine  5 Units Subcutaneous QHS   mirtazapine   15 mg Oral QHS   pantoprazole  (PROTONIX ) IV  40 mg Intravenous Q24H    Dialysis Orders: Dr Tobie or Rachele, CCPD at [facility]  Does PD 5 nights per week, takes a break on Wed and  Saturday  Assessment/Plan: Intractable N/V: getting antiemetics. Feels better. May discharge today.  ESRD: on PD started 1 month ago. Will cont PD nightly while here.  BP: bp's soft here, not on bp lowering meds at home. Looks dry, will add IVFs 75 cc/hr x 18 hrs.  Volume: looks dry as above, plan IVF's.  Anemia of esrd: Hb 10.7 after 750 cc NS. Follow.  T1DM: per pmd   Maisie Ronnald Acosta PA-C [facility] 12/29/2023,11:46 AM

## 2023-12-30 NOTE — Discharge Summary (Signed)
 Physician Discharge Summary   Patient: [name] MRN: [id number] DOB: Nov 24, 1970  Admit date:     12/27/2023  Discharge date: 12/29/2023  Discharge Physician: Elgie Butter   PCP: [facility]   Recommendations at discharge:  Please follow up with nephrology as recommended.  Please follow up with PCP in one week.   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 *  Hospital Course: [name] is a 53 y.o. female with medical history significant for ESRD on peritoneal dialysis, left below-knee amputation, cardiomyopathy, diabetes mellitus, stroke  presented to the ED with complaints of vomiting at least 3 times daily over the past 2 weeks.  She has not been able to keep anything down.  Reports to 2 episodes of watery stools over the past 2 days, but daughter reports she gave patient extra MiraLAX  as she had not had a bowel movement.   Assessment and Plan:   Persistent vomiting and an episode of diarrhea Differential include gastritis vs gastroenteritis vs gastroparesis.  Monitor. She was started on clears, was able to tolerate without any issues.  Advance diet to solids and she was able to tolerate without any issues.   Continue with PPI for now.  Pain control   Hyponatremia from fluid overload.       ESRD on PD 5 nights a week.  Nephrology on board, appreciate recommendations.       Type 1 Diabetes mellitus: uncontrolled with hypoglycemia/ hyperglycemia CBG (last 3)  CBG (last 3)  Recent Labs    12/29/23 0608 12/29/23 0831 12/29/23 1142  GLUCAP 167* 221* 399*   Resume home meds on discharge.         H/o Stroke Continue  with eliquis  2.5 mg BID.      RN Pressure Injury Documentation: Wound 12/27/23 2200 Pressure Injury Buttocks Stage 2 -  Partial thickness loss of dermis presenting as a shallow open injury with a red, pink wound bed without slough. (Active)       Consultants: nephrology Procedures performed: HD  Disposition: Home Diet recommendation:  Renal diet DISCHARGE MEDICATION: Allergies as of 12/29/2023       Reactions   Losartan  Swelling   Nsaids Other (See Comments)   Kidney disease        Medication List     STOP taking these medications    oxyCODONE  5 MG immediate release tablet Commonly known as: Oxy IR/ROXICODONE        TAKE these medications    Accu-Chek Guide Test test strip Generic drug: glucose blood Use as instructed to monitor glucose 4 times daily.  Use as back up to CGM.   acetaminophen  500 MG tablet Commonly known as: TYLENOL  Take 500 mg by mouth every 6 (six) hours as needed for moderate pain.   albuterol  108 (90 Base) MCG/ACT inhaler Commonly known as: VENTOLIN  HFA Inhale 2 puffs into the lungs every 6 (six) hours as needed for wheezing or shortness of breath.   atorvastatin  80 MG tablet Commonly known as: LIPITOR  TAKE ONE TABLET (80MG  TOTAL) BY MOUTH DAILY   calcium  carbonate 750 MG chewable tablet Commonly known as: TUMS EX Chew 1 tablet by mouth daily as needed  for heartburn.   calcium  carbonate 500 MG chewable tablet Commonly known as: TUMS - dosed in mg elemental calcium  Chew 1 tablet by mouth 3 (three) times daily with meals.   Dexcom G7 Sensor Misc Inject 1 Application into the skin as directed. Change sensor every 10 days as directed.   Eliquis  2.5 MG Tabs tablet Generic drug: apixaban  TAKE ONE TABLET (2.5MG  TOTAL) BY MOUTH TWO TIMES DAILY   Gas-X 80 MG chewable tablet Generic drug: simethicone Chew 80 mg by mouth every 6 (six) hours as needed for flatulence.   insulin  aspart 100 UNIT/ML FlexPen Commonly known as: NOVOLOG  Inject  1-7 Units into the skin 3 (three) times daily with meals.   Lantus  SoloStar 100 UNIT/ML Solostar Pen Generic drug: insulin  glargine Inject 20 Units into the skin at bedtime. What changed: how much to take   levocetirizine 5 MG tablet Commonly known as: XYZAL  Take 1 tablet (5 mg total) by mouth every evening. What changed:  when to take this reasons to take this   mirtazapine  15 MG tablet Commonly known as: REMERON  Take 15 mg by mouth at bedtime.   Misc. Devices Misc Wheelchair cushion - small.   Misc. Devices Misc Manual wheelchair - customized size.   Misc. Devices Misc Bedside commode - 1.   nitroGLYCERIN  0.4 MG SL tablet Commonly known as: NITROSTAT  PLACE ONE TABLET (0.4MG  TOTAL) UNDER THETONGUE EVERY FIVE MINUTES AS NEEDED FOR CHEST PAIN What changed: See the new instructions.   ondansetron  4 MG tablet Commonly known as: ZOFRAN  Take 8 mg by mouth every 8 (eight) hours as needed for nausea or vomiting.   pantoprazole  40 MG tablet Commonly known as: Protonix  Take 1 tablet (40 mg total) by mouth daily.   Pro Comfort Spacer Adult Misc Use it as directed for Albuterol  inhaler. Okay to dispense generic alternative preferred by insurance.   Unifine Pentips 31G X 8 MM Misc Generic drug: Insulin  Pen Needle USE TO INJECT INSULIN  SUBCUTANEOUSLY. USE FOR SLIDING SCALE AND FOR DAILY INSULIN  DOSES.               Discharge Care Instructions  (From admission, onward)           Start     Ordered   12/29/23 0000  Discharge wound care:       Comments: Clean skin near exit site with chloraprep swab sticks.  Starting at catheter, use circular pattern around exit site, moving towards outer edges of area covered by dressing.  Apply gentamicin  cream to site once daily.  Cover with dry dressing.   12/29/23 1450            Follow-up Information     [facility]. Schedule an appointment as soon as possible for a visit in 1 week(s).   Contact  information: [address] [place] [address] [phone]                Discharge Exam: Filed Weights   12/27/23 2100 12/29/23 0414 12/29/23 0836  Weight: 60.3 kg 60.1 kg 44.6 kg   General exam: Appears calm and comfortable  Respiratory system: Clear to auscultation. Respiratory effort normal. Cardiovascular system: S1 & S2 heard, RRR Gastrointestinal system: Abdomen is nondistended, soft and nontender. Central nervous system: Alert and oriented.  Extremities: Symmetric 5 x 5 power. Skin: No rashes,    Condition at discharge: fair  The results of significant diagnostics from this hospitalization (including imaging, microbiology, ancillary and laboratory) are listed below for reference.  Imaging Studies: CT ABDOMEN PELVIS WO CONTRAST Result Date: 12/27/2023 CLINICAL DATA:  Abdominal pain and vomiting for 2 weeks. Renal failure on peritoneal dialysis. EXAM: CT ABDOMEN AND PELVIS WITHOUT CONTRAST TECHNIQUE: Multidetector CT imaging of the abdomen and pelvis was performed following the standard protocol without IV contrast. RADIATION DOSE REDUCTION: This exam was performed according to the departmental dose-optimization program which includes automated exposure control, adjustment of the mA and/or kV according to patient size and/or use of iterative reconstruction technique. COMPARISON:  02/04/2022 FINDINGS: Lower chest: No acute findings. Hepatobiliary: No mass visualized on this unenhanced exam. Gallbladder is unremarkable. No evidence of biliary ductal dilatation. Pancreas: No mass or inflammatory process visualized on this unenhanced exam. Spleen:  Within normal limits in size. Adrenals/Urinary tract: Extensive bilateral renal vascular calcification noted. No definite evidence of urolithiasis, or hydronephrosis. Small fluid attenuation cyst again seen in posterior upper pole of right kidney. Bladder is empty. Peritoneal dialysis catheter seen in the pelvis. Stomach/Bowel: No  evidence of obstruction, inflammatory process, or abnormal fluid collections. Normal appendix visualized. Vascular/Lymphatic: No pathologically enlarged lymph nodes identified. No evidence of abdominal aortic aneurysm. Reproductive:  No mass or other significant abnormality. Other:  None. Musculoskeletal:  No suspicious bone lesions identified. IMPRESSION: No acute findings or other significant abnormality. Peritoneal dialysis catheter noted. Electronically Signed   By: Norleen DELENA Kil M.D.   On: 12/27/2023 16:27    Microbiology: Results for orders placed or performed during the hospital encounter of 02/24/23  Resp panel by RT-PCR (RSV, Flu A&B, Covid) Anterior Nasal Swab     Status: Abnormal   Collection Time: 02/24/23  5:14 PM   Specimen: Anterior Nasal Swab  Result Value Ref Range Status   SARS Coronavirus 2 by RT PCR NEGATIVE NEGATIVE Final   Influenza A by PCR POSITIVE (A) NEGATIVE Final   Influenza B by PCR NEGATIVE NEGATIVE Final    Comment: (NOTE) The Xpert Xpress SARS-CoV-2/FLU/RSV plus assay is intended as an aid in the diagnosis of influenza from Nasopharyngeal swab specimens and should not be used as a sole basis for treatment. Nasal washings and aspirates are unacceptable for Xpert Xpress SARS-CoV-2/FLU/RSV testing.  Fact Sheet for Patients: bloggercourse.com  Fact Sheet for Healthcare Providers: seriousbroker.it  This test is not yet approved or cleared by the [place]  FDA and has been authorized for detection and/or diagnosis of SARS-CoV-2 by FDA under an Emergency Use Authorization (EUA). This EUA will remain in effect (meaning this test can be used) for the duration of the COVID-19 declaration under Section 564(b)(1) of the Act, 21 U.S.C. section 360bbb-3(b)(1), unless the authorization is terminated or revoked.     Resp Syncytial Virus by PCR NEGATIVE NEGATIVE Final    Comment: (NOTE) Fact Sheet for  Patients: bloggercourse.com  Fact Sheet for Healthcare Providers: seriousbroker.it  This test is not yet approved or cleared by the [place]  FDA and has been authorized for detection and/or diagnosis of SARS-CoV-2 by FDA under an Emergency Use Authorization (EUA). This EUA will remain in effect (meaning this test can be used) for the duration of the COVID-19 declaration under Section 564(b)(1) of the Act, 21 U.S.C. section 360bbb-3(b)(1), unless the authorization is terminated or revoked.  Performed at [facility], [address] [place], [place] [address]     Labs: CBC: Recent Labs  Lab 12/27/23 1537 12/28/23 0252 12/29/23 0509  WBC 13.9* 10.5 8.6  NEUTROABS  --   --  4.2  HGB 14.9 11.0* 10.7*  HCT 46.2*  34.3* 32.9*  MCV 97.3 97.4 97.1  PLT 502* 319 256   Basic Metabolic Panel: Recent Labs  Lab 12/27/23 1537 12/28/23 0252 12/29/23 0509  NA 139 133* 129*  K 3.9 3.5 3.2*  CL 91* 97* 95*  CO2 23 25 24   GLUCOSE 178* 99 156*  BUN 46* 47* 44*  CREATININE 15.20* 16.16* 13.76*  CALCIUM  9.9 7.6* 6.6*   Liver Function Tests: Recent Labs  Lab 12/27/23 1537  AST 25  ALT 21  ALKPHOS 124  BILITOT 0.4  PROT 7.8  ALBUMIN  3.7   CBG: Recent Labs  Lab 12/28/23 2244 12/29/23 0007 12/29/23 0608 12/29/23 0831 12/29/23 1142  GLUCAP 373* 340* 167* 221* 399*    Discharge time spent: 34 minutes.   Signed: Elgie Butter, MD [facility] 12/30/2023

## 2024-01-09 ENCOUNTER — Other Ambulatory Visit (HOSPITAL_COMMUNITY): Payer: Self-pay

## 2024-01-09 NOTE — Telephone Encounter (Signed)
 Pharmacy Patient Advocate Encounter   Per test claim: PA required and submitted KEY/EOC/Request #: [id number] by the processor we sent it to: Resubmitted through [facility] Tracks to [facility] MEDICAID   Confirmation #:[id number] W

## 2024-01-16 ENCOUNTER — Other Ambulatory Visit: Payer: Self-pay | Admitting: Nurse Practitioner

## 2024-01-17 NOTE — Telephone Encounter (Signed)
 Noted and will wait to hear back from the PA.

## 2024-02-16 ENCOUNTER — Other Ambulatory Visit: Payer: Self-pay | Admitting: Cardiology

## 2024-02-21 MED ORDER — APIXABAN 2.5 MG PO TABS: 2.5000 mg | ORAL_TABLET | Freq: Two times a day (BID) | ORAL | 1 refills | Status: DC

## 2024-02-21 NOTE — Telephone Encounter (Signed)
 Refill for Atorvastatin  has been sent.  Forwarded the Eliquis  request over to our pharmacy pool for authorization.

## 2024-02-21 NOTE — Telephone Encounter (Signed)
 Prescription refill request for Eliquis  received. Indication: ICM Last office visit: 10/31/23  [name] MD Scr: 13.76 on 12/29/23 Age: 54 Weight: 54.2kg  Based on above findings Eliquis  2.5mg  twice daily is the appropriate dose.  Refill approved.

## 2024-02-21 NOTE — Telephone Encounter (Signed)
Prescription refill request for Eliquis received. Indication: Last office visit: Scr: Age:  Weight:

## 2024-02-21 NOTE — Telephone Encounter (Signed)
 Pt calling to f/u on refills. Please advise.

## 2024-02-22 ENCOUNTER — Other Ambulatory Visit: Payer: Self-pay | Admitting: Cardiology
